# Patient Record
Sex: Male | Born: 1950 | Race: White | Hispanic: No | Marital: Married | Smoking: Never smoker
Health system: Southern US, Community
[De-identification: ages and names within clinical notes are randomized; demographics above are authoritative.]

## PROBLEM LIST (undated history)

## (undated) DIAGNOSIS — N410 Acute prostatitis: Secondary | ICD-10-CM

## (undated) DIAGNOSIS — J9601 Acute respiratory failure with hypoxia: Secondary | ICD-10-CM

## (undated) DIAGNOSIS — K649 Unspecified hemorrhoids: Secondary | ICD-10-CM

## (undated) DIAGNOSIS — L409 Psoriasis, unspecified: Secondary | ICD-10-CM

## (undated) DIAGNOSIS — Z8719 Personal history of other diseases of the digestive system: Secondary | ICD-10-CM

## (undated) DIAGNOSIS — K9189 Other postprocedural complications and disorders of digestive system: Secondary | ICD-10-CM

## (undated) DIAGNOSIS — K5000 Crohn's disease of small intestine without complications: Secondary | ICD-10-CM

## (undated) DIAGNOSIS — I77810 Thoracic aortic ectasia: Secondary | ICD-10-CM

## (undated) DIAGNOSIS — K432 Incisional hernia without obstruction or gangrene: Secondary | ICD-10-CM

## (undated) DIAGNOSIS — K5732 Diverticulitis of large intestine without perforation or abscess without bleeding: Secondary | ICD-10-CM

## (undated) DIAGNOSIS — B029 Zoster without complications: Secondary | ICD-10-CM

## (undated) DIAGNOSIS — N138 Other obstructive and reflux uropathy: Secondary | ICD-10-CM

## (undated) DIAGNOSIS — M1990 Unspecified osteoarthritis, unspecified site: Secondary | ICD-10-CM

## (undated) DIAGNOSIS — B009 Herpesviral infection, unspecified: Secondary | ICD-10-CM

## (undated) DIAGNOSIS — K567 Ileus, unspecified: Secondary | ICD-10-CM

## (undated) DIAGNOSIS — K759 Inflammatory liver disease, unspecified: Secondary | ICD-10-CM

## (undated) DIAGNOSIS — I471 Supraventricular tachycardia, unspecified: Secondary | ICD-10-CM

## (undated) DIAGNOSIS — N453 Epididymo-orchitis: Secondary | ICD-10-CM

## (undated) DIAGNOSIS — T7840XA Allergy, unspecified, initial encounter: Secondary | ICD-10-CM

## (undated) DIAGNOSIS — K602 Anal fissure, unspecified: Secondary | ICD-10-CM

## (undated) DIAGNOSIS — K90829 Short bowel syndrome, unspecified: Secondary | ICD-10-CM

## (undated) DIAGNOSIS — K449 Diaphragmatic hernia without obstruction or gangrene: Secondary | ICD-10-CM

## (undated) DIAGNOSIS — IMO0002 Reserved for concepts with insufficient information to code with codable children: Secondary | ICD-10-CM

## (undated) DIAGNOSIS — N401 Enlarged prostate with lower urinary tract symptoms: Secondary | ICD-10-CM

## (undated) DIAGNOSIS — Z5189 Encounter for other specified aftercare: Secondary | ICD-10-CM

## (undated) DIAGNOSIS — K219 Gastro-esophageal reflux disease without esophagitis: Secondary | ICD-10-CM

## (undated) HISTORY — DX: Other obstructive and reflux uropathy: N40.1

## (undated) HISTORY — PX: HEMICOLECTOMY: SHX854

## (undated) HISTORY — DX: Encounter for other specified aftercare: Z51.89

## (undated) HISTORY — PX: ESOPHAGOGASTRODUODENOSCOPY: SHX1529

## (undated) HISTORY — PX: ILEOSTOMY: SHX1783

## (undated) HISTORY — PX: CYSTOSCOPY: SUR368

## (undated) HISTORY — DX: Gastro-esophageal reflux disease without esophagitis: K21.9

## (undated) HISTORY — DX: Allergy, unspecified, initial encounter: T78.40XA

## (undated) HISTORY — DX: Herpesviral infection, unspecified: B00.9

## (undated) HISTORY — DX: Acute prostatitis: N41.0

## (undated) HISTORY — DX: Psoriasis, unspecified: L40.9

## (undated) HISTORY — DX: Inflammatory liver disease, unspecified: K75.9

## (undated) HISTORY — DX: Unspecified osteoarthritis, unspecified site: M19.90

## (undated) HISTORY — DX: Anal fissure, unspecified: K60.2

## (undated) HISTORY — PX: HEMORRHOID BANDING: SHX5850

## (undated) HISTORY — PX: PACEMAKER INSERTION: SHX728

## (undated) HISTORY — DX: Epididymo-orchitis: N45.3

## (undated) HISTORY — DX: Thoracic aortic ectasia: I77.810

## (undated) HISTORY — PX: UPPER GASTROINTESTINAL ENDOSCOPY: SHX188

## (undated) HISTORY — DX: Diverticulitis of large intestine without perforation or abscess without bleeding: K57.32

## (undated) HISTORY — DX: Personal history of other diseases of the digestive system: Z87.19

## (undated) HISTORY — DX: Unspecified hemorrhoids: K64.9

## (undated) HISTORY — DX: Acute respiratory failure with hypoxia: J96.01

## (undated) HISTORY — PX: ILEOSTOMY CLOSURE: SHX1784

## (undated) HISTORY — DX: Reserved for concepts with insufficient information to code with codable children: IMO0002

## (undated) HISTORY — PX: VASECTOMY: SHX75

## (undated) HISTORY — PX: BRONCHOSCOPY: SUR163

## (undated) HISTORY — DX: Supraventricular tachycardia, unspecified: I47.10

## (undated) HISTORY — DX: Crohn's disease of small intestine without complications: K50.00

## (undated) HISTORY — DX: Supraventricular tachycardia: I47.1

## (undated) HISTORY — DX: Other obstructive and reflux uropathy: N13.8

## (undated) HISTORY — DX: Incisional hernia without obstruction or gangrene: K43.2

## (undated) HISTORY — DX: Zoster without complications: B02.9

## (undated) HISTORY — PX: HERNIA REPAIR: SHX51

## (undated) HISTORY — DX: Short bowel syndrome, unspecified: K90.829

## (undated) HISTORY — DX: Diaphragmatic hernia without obstruction or gangrene: K44.9

## (undated) HISTORY — PX: TONSILLECTOMY: SUR1361

---

## 1973-07-17 DIAGNOSIS — K759 Inflammatory liver disease, unspecified: Secondary | ICD-10-CM

## 1973-07-17 HISTORY — DX: Inflammatory liver disease, unspecified: K75.9

## 1998-08-10 ENCOUNTER — Emergency Department (HOSPITAL_COMMUNITY): Admission: EM | Admit: 1998-08-10 | Discharge: 1998-08-10 | Payer: Self-pay

## 1998-08-21 ENCOUNTER — Ambulatory Visit (HOSPITAL_COMMUNITY): Admission: RE | Admit: 1998-08-21 | Discharge: 1998-08-21 | Payer: Self-pay

## 1999-01-28 ENCOUNTER — Ambulatory Visit (HOSPITAL_COMMUNITY): Admission: RE | Admit: 1999-01-28 | Discharge: 1999-01-28 | Payer: Self-pay | Admitting: Gastroenterology

## 1999-01-28 ENCOUNTER — Encounter: Payer: Self-pay | Admitting: Gastroenterology

## 1999-03-24 ENCOUNTER — Ambulatory Visit (HOSPITAL_COMMUNITY): Admission: RE | Admit: 1999-03-24 | Discharge: 1999-03-24 | Payer: Self-pay | Admitting: Gastroenterology

## 1999-07-01 ENCOUNTER — Emergency Department (HOSPITAL_COMMUNITY): Admission: EM | Admit: 1999-07-01 | Discharge: 1999-07-01 | Payer: Self-pay | Admitting: Emergency Medicine

## 1999-07-20 ENCOUNTER — Ambulatory Visit (HOSPITAL_COMMUNITY): Admission: RE | Admit: 1999-07-20 | Discharge: 1999-07-20 | Payer: Self-pay | Admitting: Gastroenterology

## 1999-07-20 ENCOUNTER — Encounter: Payer: Self-pay | Admitting: Gastroenterology

## 1999-10-30 ENCOUNTER — Emergency Department (HOSPITAL_COMMUNITY): Admission: EM | Admit: 1999-10-30 | Discharge: 1999-10-30 | Payer: Self-pay | Admitting: Emergency Medicine

## 1999-12-01 ENCOUNTER — Encounter: Payer: Self-pay | Admitting: Gastroenterology

## 1999-12-01 ENCOUNTER — Ambulatory Visit (HOSPITAL_COMMUNITY): Admission: RE | Admit: 1999-12-01 | Discharge: 1999-12-01 | Payer: Self-pay | Admitting: Gastroenterology

## 2001-01-02 ENCOUNTER — Encounter (INDEPENDENT_AMBULATORY_CARE_PROVIDER_SITE_OTHER): Payer: Self-pay | Admitting: Specialist

## 2001-01-02 ENCOUNTER — Ambulatory Visit (HOSPITAL_COMMUNITY): Admission: RE | Admit: 2001-01-02 | Discharge: 2001-01-02 | Payer: Self-pay | Admitting: Critical Care Medicine

## 2001-01-02 ENCOUNTER — Encounter: Payer: Self-pay | Admitting: Critical Care Medicine

## 2001-01-03 ENCOUNTER — Encounter: Payer: Self-pay | Admitting: Critical Care Medicine

## 2001-01-03 ENCOUNTER — Ambulatory Visit (HOSPITAL_COMMUNITY): Admission: RE | Admit: 2001-01-03 | Discharge: 2001-01-03 | Payer: Self-pay | Admitting: Critical Care Medicine

## 2002-08-28 ENCOUNTER — Emergency Department (HOSPITAL_COMMUNITY): Admission: EM | Admit: 2002-08-28 | Discharge: 2002-08-28 | Payer: Self-pay | Admitting: Emergency Medicine

## 2002-08-28 ENCOUNTER — Encounter: Payer: Self-pay | Admitting: Emergency Medicine

## 2003-11-12 ENCOUNTER — Encounter: Admission: RE | Admit: 2003-11-12 | Discharge: 2003-11-12 | Payer: Self-pay | Admitting: Surgery

## 2003-11-27 ENCOUNTER — Encounter: Admission: RE | Admit: 2003-11-27 | Discharge: 2003-11-27 | Payer: Self-pay | Admitting: Surgery

## 2003-12-21 ENCOUNTER — Ambulatory Visit (HOSPITAL_COMMUNITY): Admission: RE | Admit: 2003-12-21 | Discharge: 2003-12-21 | Payer: Self-pay | Admitting: Gastroenterology

## 2003-12-29 ENCOUNTER — Encounter: Admission: RE | Admit: 2003-12-29 | Discharge: 2004-03-28 | Payer: Self-pay | Admitting: Gastroenterology

## 2004-01-15 HISTORY — PX: COLON SURGERY: SHX602

## 2004-03-15 ENCOUNTER — Emergency Department (HOSPITAL_COMMUNITY): Admission: EM | Admit: 2004-03-15 | Discharge: 2004-03-15 | Payer: Self-pay | Admitting: Emergency Medicine

## 2004-06-08 ENCOUNTER — Encounter: Admission: RE | Admit: 2004-06-08 | Discharge: 2004-07-26 | Payer: Self-pay | Admitting: Surgery

## 2004-06-11 ENCOUNTER — Ambulatory Visit: Payer: Self-pay | Admitting: Internal Medicine

## 2004-06-25 ENCOUNTER — Ambulatory Visit: Payer: Self-pay | Admitting: Family Medicine

## 2004-07-07 ENCOUNTER — Ambulatory Visit: Payer: Self-pay | Admitting: Family Medicine

## 2004-08-12 ENCOUNTER — Ambulatory Visit: Payer: Self-pay | Admitting: Family Medicine

## 2004-10-17 ENCOUNTER — Ambulatory Visit: Payer: Self-pay | Admitting: Family Medicine

## 2004-11-29 ENCOUNTER — Ambulatory Visit: Payer: Self-pay | Admitting: Family Medicine

## 2005-01-26 ENCOUNTER — Ambulatory Visit: Payer: Self-pay | Admitting: Family Medicine

## 2005-02-10 ENCOUNTER — Inpatient Hospital Stay (HOSPITAL_COMMUNITY): Admission: RE | Admit: 2005-02-10 | Discharge: 2005-02-12 | Payer: Self-pay | Admitting: Surgery

## 2005-02-23 ENCOUNTER — Ambulatory Visit: Payer: Self-pay | Admitting: Family Medicine

## 2005-03-02 ENCOUNTER — Ambulatory Visit: Payer: Self-pay | Admitting: Family Medicine

## 2005-05-01 ENCOUNTER — Ambulatory Visit: Payer: Self-pay | Admitting: Family Medicine

## 2005-07-04 ENCOUNTER — Encounter: Admission: RE | Admit: 2005-07-04 | Discharge: 2005-07-04 | Payer: Self-pay | Admitting: Surgery

## 2005-08-09 ENCOUNTER — Ambulatory Visit: Payer: Self-pay | Admitting: Family Medicine

## 2006-06-08 ENCOUNTER — Emergency Department (HOSPITAL_COMMUNITY): Admission: EM | Admit: 2006-06-08 | Discharge: 2006-06-08 | Payer: Self-pay | Admitting: Emergency Medicine

## 2006-06-11 ENCOUNTER — Ambulatory Visit: Payer: Self-pay | Admitting: Family Medicine

## 2006-11-09 ENCOUNTER — Ambulatory Visit: Payer: Self-pay | Admitting: Family Medicine

## 2006-12-19 ENCOUNTER — Ambulatory Visit: Payer: Self-pay | Admitting: Family Medicine

## 2007-03-06 ENCOUNTER — Ambulatory Visit: Payer: Self-pay | Admitting: Family Medicine

## 2007-03-06 DIAGNOSIS — Z8719 Personal history of other diseases of the digestive system: Secondary | ICD-10-CM | POA: Insufficient documentation

## 2007-03-06 DIAGNOSIS — J019 Acute sinusitis, unspecified: Secondary | ICD-10-CM | POA: Insufficient documentation

## 2007-03-06 DIAGNOSIS — J45909 Unspecified asthma, uncomplicated: Secondary | ICD-10-CM | POA: Insufficient documentation

## 2007-03-06 DIAGNOSIS — K219 Gastro-esophageal reflux disease without esophagitis: Secondary | ICD-10-CM | POA: Insufficient documentation

## 2007-03-08 ENCOUNTER — Telehealth: Payer: Self-pay | Admitting: Family Medicine

## 2007-03-15 ENCOUNTER — Ambulatory Visit: Payer: Self-pay | Admitting: Family Medicine

## 2007-07-24 ENCOUNTER — Ambulatory Visit: Payer: Self-pay | Admitting: Family Medicine

## 2007-07-24 DIAGNOSIS — N410 Acute prostatitis: Secondary | ICD-10-CM

## 2007-07-24 DIAGNOSIS — N400 Enlarged prostate without lower urinary tract symptoms: Secondary | ICD-10-CM | POA: Insufficient documentation

## 2007-07-24 HISTORY — DX: Acute prostatitis: N41.0

## 2007-07-24 LAB — CONVERTED CEMR LAB
Bilirubin Urine: NEGATIVE
Blood in Urine, dipstick: NEGATIVE
Glucose, Urine, Semiquant: NEGATIVE
Ketones, urine, test strip: NEGATIVE
Protein, U semiquant: NEGATIVE
Urobilinogen, UA: 0.2

## 2007-09-17 ENCOUNTER — Ambulatory Visit: Payer: Self-pay | Admitting: Family Medicine

## 2007-09-19 LAB — CONVERTED CEMR LAB: GC Probe Amp, Urine: NEGATIVE

## 2007-10-09 ENCOUNTER — Telehealth: Payer: Self-pay | Admitting: Family Medicine

## 2007-10-14 ENCOUNTER — Ambulatory Visit: Payer: Self-pay | Admitting: Family Medicine

## 2007-10-14 DIAGNOSIS — B009 Herpesviral infection, unspecified: Secondary | ICD-10-CM | POA: Insufficient documentation

## 2007-10-14 HISTORY — DX: Herpesviral infection, unspecified: B00.9

## 2007-12-05 ENCOUNTER — Ambulatory Visit: Payer: Self-pay | Admitting: Family Medicine

## 2008-03-25 ENCOUNTER — Encounter: Payer: Self-pay | Admitting: Family Medicine

## 2008-09-05 ENCOUNTER — Inpatient Hospital Stay (HOSPITAL_COMMUNITY): Admission: EM | Admit: 2008-09-05 | Discharge: 2008-09-06 | Payer: Self-pay | Admitting: Emergency Medicine

## 2008-09-05 ENCOUNTER — Ambulatory Visit: Payer: Self-pay | Admitting: Internal Medicine

## 2008-09-05 ENCOUNTER — Ambulatory Visit: Payer: Self-pay | Admitting: Cardiovascular Disease

## 2008-09-06 ENCOUNTER — Encounter: Payer: Self-pay | Admitting: Family Medicine

## 2008-09-17 ENCOUNTER — Telehealth: Payer: Self-pay | Admitting: Family Medicine

## 2008-09-18 ENCOUNTER — Ambulatory Visit: Payer: Self-pay | Admitting: Family Medicine

## 2008-09-18 DIAGNOSIS — R079 Chest pain, unspecified: Secondary | ICD-10-CM | POA: Insufficient documentation

## 2009-05-28 ENCOUNTER — Ambulatory Visit: Payer: Self-pay | Admitting: Family Medicine

## 2009-05-28 DIAGNOSIS — R109 Unspecified abdominal pain: Secondary | ICD-10-CM | POA: Insufficient documentation

## 2009-05-28 DIAGNOSIS — IMO0001 Reserved for inherently not codable concepts without codable children: Secondary | ICD-10-CM | POA: Insufficient documentation

## 2009-05-28 DIAGNOSIS — F909 Attention-deficit hyperactivity disorder, unspecified type: Secondary | ICD-10-CM | POA: Insufficient documentation

## 2009-05-28 LAB — CONVERTED CEMR LAB
Ketones, urine, test strip: NEGATIVE
Urobilinogen, UA: 0.2
pH: 5.5

## 2009-05-31 LAB — CONVERTED CEMR LAB
BUN: 12 mg/dL (ref 6–23)
CO2: 26 meq/L (ref 19–32)
Creatinine, Ser: 1.2 mg/dL (ref 0.4–1.5)
GFR calc non Af Amer: 66.47 mL/min (ref >60)
Glucose, Bld: 84 mg/dL (ref 70–99)
Potassium: 3.8 meq/L (ref 3.5–5.1)
Total Bilirubin: 1.2 mg/dL (ref 0.3–1.2)
Total Protein: 6.5 g/dL (ref 6.0–8.3)

## 2009-06-28 ENCOUNTER — Telehealth: Payer: Self-pay | Admitting: Family Medicine

## 2009-10-06 ENCOUNTER — Ambulatory Visit: Payer: Self-pay | Admitting: Family Medicine

## 2009-10-06 DIAGNOSIS — K5289 Other specified noninfective gastroenteritis and colitis: Secondary | ICD-10-CM | POA: Insufficient documentation

## 2009-10-07 ENCOUNTER — Encounter: Payer: Self-pay | Admitting: Family Medicine

## 2009-10-07 ENCOUNTER — Telehealth: Payer: Self-pay | Admitting: Family Medicine

## 2009-10-08 LAB — CONVERTED CEMR LAB
AST: 26 units/L (ref 0–37)
Albumin: 3.7 g/dL (ref 3.5–5.2)
Alkaline Phosphatase: 60 units/L (ref 39–117)
BUN: 13 mg/dL (ref 6–23)
Basophils Relative: 0.1 % (ref 0.0–3.0)
Bilirubin, Direct: 0 mg/dL (ref 0.0–0.3)
CO2: 33 meq/L — ABNORMAL HIGH (ref 19–32)
Chloride: 108 meq/L (ref 96–112)
Creatinine, Ser: 1.5 mg/dL (ref 0.4–1.5)
Eosinophils Absolute: 0.1 10*3/uL (ref 0.0–0.7)
HCT: 49 % (ref 39.0–52.0)
HCV Ab: NEGATIVE
Hep A IgM: NEGATIVE
Hep B S Ab: NEGATIVE
Lymphocytes Relative: 26.8 % (ref 12.0–46.0)
MCHC: 33.3 g/dL (ref 30.0–36.0)
Monocytes Absolute: 0.7 10*3/uL (ref 0.1–1.0)
Neutrophils Relative %: 62.5 % (ref 43.0–77.0)
Platelets: 227 10*3/uL (ref 150.0–400.0)
Potassium: 3.6 meq/L (ref 3.5–5.1)
RBC: 5.36 M/uL (ref 4.22–5.81)
Total Bilirubin: 0.4 mg/dL (ref 0.3–1.2)

## 2009-10-26 ENCOUNTER — Telehealth: Payer: Self-pay | Admitting: Family Medicine

## 2009-12-15 ENCOUNTER — Ambulatory Visit: Payer: Self-pay | Admitting: Family Medicine

## 2009-12-15 DIAGNOSIS — J069 Acute upper respiratory infection, unspecified: Secondary | ICD-10-CM | POA: Insufficient documentation

## 2009-12-17 ENCOUNTER — Ambulatory Visit: Payer: Self-pay | Admitting: Family Medicine

## 2009-12-17 DIAGNOSIS — J209 Acute bronchitis, unspecified: Secondary | ICD-10-CM | POA: Insufficient documentation

## 2009-12-25 ENCOUNTER — Ambulatory Visit: Payer: Self-pay | Admitting: Family Medicine

## 2010-02-15 ENCOUNTER — Ambulatory Visit: Payer: Self-pay | Admitting: Family Medicine

## 2010-02-15 DIAGNOSIS — N453 Epididymo-orchitis: Secondary | ICD-10-CM | POA: Insufficient documentation

## 2010-02-15 HISTORY — DX: Epididymo-orchitis: N45.3

## 2010-02-15 LAB — CONVERTED CEMR LAB
Glucose, Urine, Semiquant: NEGATIVE
Ketones, urine, test strip: NEGATIVE
Nitrite: NEGATIVE
Protein, U semiquant: 30
Urobilinogen, UA: 0.2

## 2010-02-18 ENCOUNTER — Encounter: Payer: Self-pay | Admitting: Family Medicine

## 2010-03-07 ENCOUNTER — Encounter: Payer: Self-pay | Admitting: Family Medicine

## 2010-03-17 ENCOUNTER — Telehealth: Payer: Self-pay | Admitting: Family Medicine

## 2010-04-04 ENCOUNTER — Encounter: Payer: Self-pay | Admitting: Family Medicine

## 2010-05-30 ENCOUNTER — Ambulatory Visit: Payer: Self-pay | Admitting: Family Medicine

## 2010-08-16 NOTE — Progress Notes (Signed)
Summary: lab results  Phone Note Call from Patient Call back at Home Phone [phone]   Caller: Patient Summary of Call: patient would like his lab test results.   Initial call taken by: Westley Hummer CMA Deborra Medina),  October 07, 2009 3:59 PM  Follow-up for Phone Call        all normal. This is consistent with a GI virus which should resolve soon Follow-up by: [name] MD,  October 08, 2009 8:39 AM  Additional Follow-up for Phone Call Additional follow up Details #1::        called. Additional Follow-up by: Chipper Oman, RN,  October 08, 2009 9:39 AM

## 2010-08-16 NOTE — Assessment & Plan Note (Signed)
Summary: not any better//ccm   Vital Signs:  Patient profile:   60 year old male Weight:      225 pounds Temp:     98.2 degrees F oral BP sitting:   130 / 80  (left arm) Cuff size:   regular  Vitals Entered By: Chipper Oman, RN (December 17, 2009 10:15 AM) CC: No better, still congested and coughing.   History of Present Illness: Here for worsening chest congestion and coughing up green sputum. No fever.   Allergies: 1)  ! * Shellfish  Past History:  Past Medical History: Reviewed history from 10/06/2009 and no changes required. Diverticular Disease Hepatitis, unknown type, 1975 Asthma Diverticulitis, hx of GERD with a hiatal hernia UTI's Benign prostatic hypertrophy, sees Dr. Reece Agar psoriasis, sees Dr. Daiva Eves at [facility] dermatology  Review of Systems  The patient denies anorexia, fever, weight loss, weight gain, vision loss, decreased hearing, hoarseness, chest pain, syncope, dyspnea on exertion, peripheral edema, headaches, hemoptysis, abdominal pain, melena, hematochezia, severe indigestion/heartburn, hematuria, incontinence, genital sores, muscle weakness, suspicious skin lesions, transient blindness, difficulty walking, depression, unusual weight change, abnormal bleeding, enlarged lymph nodes, angioedema, breast masses, and testicular masses.    Physical Exam  General:  Well-developed,well-nourished,in no acute distress; alert,appropriate and cooperative throughout examination Head:  Normocephalic and atraumatic without obvious abnormalities. No apparent alopecia or balding. Eyes:  No corneal or conjunctival inflammation noted. EOMI. Perrla. Funduscopic exam benign, without hemorrhages, exudates or papilledema. Vision grossly normal. Ears:  External ear exam shows no significant lesions or deformities.  Otoscopic examination reveals clear canals, tympanic membranes are intact bilaterally without bulging, retraction, inflammation or discharge. Hearing is grossly  normal bilaterally. Nose:  External nasal examination shows no deformity or inflammation. Nasal mucosa are pink and moist without lesions or exudates. Mouth:  Oral mucosa and oropharynx without lesions or exudates.  Teeth in good repair. Neck:  No deformities, masses, or tenderness noted. Lungs:  scattered rhonchi , no rales Heart:  Normal rate and regular rhythm. S1 and S2 normal without gallop, murmur, click, rub or other extra sounds.   Impression & Recommendations:  Problem # 1:  ACUTE BRONCHITIS (ICD-466.0)  His updated medication list for this problem includes:    Proventil Hfa 108 (90 Base) Mcg/act Aers (Albuterol sulfate) .Marland Kitchen... 2 puffs q 4 hours as needed    Zithromax Z-pak 250 Mg Tabs (Azithromycin) .Marland Kitchen... As directed    Hydromet 5-1.5 Mg/58m Syrp (Hydrocodone-homatropine) ..Marland Kitchen.. 1 tsp q 4 hours as needed cough  Complete Medication List: 1)  Wellbutrin Xl 300 Mg Tb24 (Bupropion hcl) .... Once daily 2)  Cialis 2.5 Mg Tabs (Tadalafil) ..Marland Kitchen. 1 or 2 once daily 3)  Famvir 500 Mg Tabs (Famciclovir) .... Once daily 4)  Proventil Hfa 108 (90 Base) Mcg/act Aers (Albuterol sulfate) .... 2 puffs q 4 hours as needed 5)  Focalin Xr 20 Mg Xr24h-cap (Dexmethylphenidate hcl) .... Once daily, may fill on 12-26-09 6)  Flexeril 10 Mg Tabs (Cyclobenzaprine hcl) .... Three times a day as needed spasm 7)  Zithromax Z-pak 250 Mg Tabs (Azithromycin) .... As directed 8)  Hydromet 5-1.5 Mg/563mSyrp (Hydrocodone-homatropine) ...Marland Kitchen 1 tsp q 4 hours as needed cough  Patient Instructions: 1)  Please schedule a follow-up appointment as needed .  Prescriptions: HYDROMET 5-1.5 MG/5ML SYRP (HYDROCODONE-HOMATROPINE) 1 tsp q 4 hours as needed cough  #240 x 0   Entered and Authorized by:   StLaurey MoraleD   Signed by:   StLaurey Morale  MD on 12/17/2009   Method used:   Print then Give to Patient   RxID:   [id number] ZITHROMAX Z-PAK 250 MG TABS (AZITHROMYCIN) as directed  #1 x 0   Entered and Authorized by:    [name] MD   Signed by:   [name] MD on 12/17/2009   Method used:   Print then Give to Patient   RxID:   [id number]

## 2010-08-16 NOTE — Assessment & Plan Note (Signed)
Summary: ? uti//ccm   Vital Signs:  Patient profile:   60 year old male Weight:      231 pounds Temp:     98.0 degrees F oral BP sitting:   140 / 90  (left arm) Cuff size:   large  Vitals Entered By: Kathrynn Speed CMA (February 15, 2010 11:30 AM) CC: UTI, src   History of Present Illness: Here for about a month of intermittent right  testicular pains. he has had these off and on for a couple years. No fever. No trouble with urinations. No recent trauma. He took a course of Cipro for this last year, but it did not seem to help.   Current Medications (verified): 1)  Wellbutrin Xl 300 Mg  Tb24 (Bupropion Hcl) .... Once Daily 2)  Cialis 2.5 Mg  Tabs (Tadalafil) .Marland Kitchen.. 1 or 2 Once Daily 3)  Famvir 500 Mg  Tabs (Famciclovir) .... Once Daily 4)  Proventil Hfa 108 (90 Base) Mcg/act Aers (Albuterol Sulfate) .... 2 Puffs Q 4 Hours As Needed 5)  Focalin Xr 20 Mg Xr24h-Cap (Dexmethylphenidate Hcl) .... Once Daily, May Fill On 12-26-09 6)  Flexeril 10 Mg Tabs (Cyclobenzaprine Hcl) .... Three Times A Day As Needed Spasm  Allergies (verified): 1)  ! * Shellfish  Past History:  Past Medical History: Reviewed history from 10/06/2009 and no changes required. Diverticular Disease Hepatitis, unknown type, 1975 Asthma Diverticulitis, hx of GERD with a hiatal hernia UTI's Benign prostatic hypertrophy, sees Dr. Wanda Plump psoriasis, sees Dr. Novella Rob at [facility] dermatology  Past Surgical History: Reviewed history from 03/06/2007 and no changes required. Hemicolectomy ileostomy colonoscopy cystoscopy bronchoscopy Tonsillectomy Vasectomy  Review of Systems  The patient denies anorexia, fever, weight loss, weight gain, vision loss, decreased hearing, hoarseness, chest pain, syncope, dyspnea on exertion, peripheral edema, prolonged cough, headaches, hemoptysis, abdominal pain, melena, hematochezia, severe indigestion/heartburn, hematuria, incontinence, genital sores, muscle weakness,  suspicious skin lesions, transient blindness, difficulty walking, depression, unusual weight change, abnormal bleeding, enlarged lymph nodes, angioedema, breast masses, and testicular masses.    Physical Exam  General:  Well-developed,well-nourished,in no acute distress; alert,appropriate and cooperative throughout examination Abdomen:  Bowel sounds positive,abdomen soft and non-tender without masses, organomegaly or hernias noted. Genitalia:  there is a small cystic mass to the medial side of the right testicle which is quite tender. The testicle is not tender. The left side is normal   Impression & Recommendations:  Problem # 1:  EPIDIDYMITIS (ICD-604.90)  Orders: UA Dipstick w/o Micro (manual) (16109)  Complete Medication List: 1)  Wellbutrin Xl 300 Mg Tb24 (Bupropion hcl) .... Once daily 2)  Cialis 2.5 Mg Tabs (Tadalafil) .Marland Kitchen.. 1 or 2 once daily 3)  Famvir 500 Mg Tabs (Famciclovir) .... Once daily 4)  Proventil Hfa 108 (90 Base) Mcg/act Aers (Albuterol sulfate) .... 2 puffs q 4 hours as needed 5)  Focalin Xr 20 Mg Xr24h-cap (Dexmethylphenidate hcl) .... Once daily, may fill on 12-26-09 6)  Flexeril 10 Mg Tabs (Cyclobenzaprine hcl) .... Three times a day as needed spasm 7)  Bactrim Ds 800-160 Mg Tabs (Sulfamethoxazole-trimethoprim) .... Two times a day  Patient Instructions: 1)  Treat with 30 days of Bactrim DS. He already made an appt to see Dr. Aldean Ast on 03-04-10, so I encouraged him to keep this. The scrotal mass should be evaluated with an [place], and this could be done that day.  Prescriptions: BACTRIM DS 800-160 MG TABS (SULFAMETHOXAZOLE-TRIMETHOPRIM) two times a day  #60 x 0   Entered and  Authorized by:   [name] MD   Signed by:   [name] MD on 02/15/2010   Method used:   Electronically to        [phone][facility] (retail)       [address]       [place], [place]  [address]       Ph: [phone] or [phone]       Fax: [phone]   RxID:    [id number]   Laboratory Results   Urine Tests  Date/Time Received: February 15, 2010 11:38 AM   Routine Urinalysis   Color: yellow Appearance: Clear Glucose: negative   (Normal Range: Negative) Bilirubin: negative   (Normal Range: Negative) Ketone: negative   (Normal Range: Negative) Spec. Gravity: 1.030   (Normal Range: 1.003-1.035) Blood: negative   (Normal Range: Negative) pH: 6.0   (Normal Range: 5.0-8.0) Protein: 30    (Normal Range: Negative) Urobilinogen: 0.2   (Normal Range: 0-1) Nitrite: negative   (Normal Range: Negative) Leukocyte Esterace: negative   (Normal Range: Negative)    Comments: Kathrynn Speed CMA  February 15, 2010 11:40 AM

## 2010-08-16 NOTE — Assessment & Plan Note (Signed)
Summary: ? gi issues//ccm   Vital Signs:  Patient profile:   60 year old male Weight:      222 pounds O2 Sat:      94 % Temp:     97.8 degrees F Pulse rate:   100 / minute BP sitting:   140 / 80  (left arm)  Vitals Entered By: Levora Angel, RN (May 30, 2010 10:38 AM) CC: diarrhea cramps since Friday no vomiting    History of Present Illness: Here for 3 days of mid abdominal cramps and diarrhea. No nausea or vomiting. He had a fever the first 24 hours but none since then. Using Imodium. No international travel. His wife is not sick.   Allergies: 1)  ! * Shellfish  Past History:  Past Medical History: Reviewed history from 10/06/2009 and no changes required. Diverticular Disease Hepatitis, unknown type, 1975 Asthma Diverticulitis, hx of GERD with a hiatal hernia UTI's Benign prostatic hypertrophy, sees Dr. Reece Agar psoriasis, sees Dr. Daiva Eves at [facility] dermatology  Past Surgical History: Reviewed history from 03/06/2007 and no changes required. Hemicolectomy ileostomy colonoscopy cystoscopy bronchoscopy Tonsillectomy Vasectomy  Review of Systems  The patient denies anorexia, weight loss, weight gain, vision loss, decreased hearing, hoarseness, chest pain, syncope, dyspnea on exertion, peripheral edema, prolonged cough, headaches, hemoptysis, melena, hematochezia, severe indigestion/heartburn, hematuria, incontinence, genital sores, muscle weakness, suspicious skin lesions, transient blindness, difficulty walking, depression, unusual weight change, abnormal bleeding, enlarged lymph nodes, angioedema, breast masses, and testicular masses.    Physical Exam  General:  Well-developed,well-nourished,in no acute distress; alert,appropriate and cooperative throughout examination Lungs:  Normal respiratory effort, chest expands symmetrically. Lungs are clear to auscultation, no crackles or wheezes. Heart:  Normal rate and regular rhythm. S1 and S2 normal without  gallop, murmur, click, rub or other extra sounds. Abdomen:  soft, normal bowel sounds, no distention, no masses, no guarding, no rigidity, no rebound tenderness, no abdominal hernia, no inguinal hernia, no hepatomegaly, and no splenomegaly.  Mild periumbilical tenederness .   Impression & Recommendations:  Problem # 1:  GASTROENTERITIS (ICD-558.9)  His updated medication list for this problem includes:    Lomotil 2.5-0.025 Mg Tabs (Diphenoxylate-atropine) .Marland Kitchen... 2 tabs q 4 hours as needed diarrhea  Complete Medication List: 1)  Wellbutrin Xl 300 Mg Tb24 (Bupropion hcl) .... Once daily 2)  Cialis 2.5 Mg Tabs (Tadalafil) .Marland Kitchen.. 1 or 2 once daily 3)  Famvir 500 Mg Tabs (Famciclovir) .... Once daily 4)  Proventil Hfa 108 (90 Base) Mcg/act Aers (Albuterol sulfate) .... 2 puffs q 4 hours as needed 5)  Focalin Xr 20 Mg Xr24h-cap (Dexmethylphenidate hcl) .... Once daily, may fill on 05-18-10 6)  Flexeril 10 Mg Tabs (Cyclobenzaprine hcl) .... Three times a day as needed spasm 7)  Lomotil 2.5-0.025 Mg Tabs (Diphenoxylate-atropine) .... 2 tabs q 4 hours as needed diarrhea  Patient Instructions: 1)  Use Lomotil as needed . Drink plenty of fluids. This should resolve soon.  Prescriptions: LOMOTIL 2.5-0.025 MG TABS (DIPHENOXYLATE-ATROPINE) 2 tabs q 4 hours as needed diarrhea  #60 x 1   Entered and Authorized by:   [name] MD   Signed by:   [name] MD on 05/30/2010   Method used:   Print then Give to Patient   RxID:   [id number]    Orders Added: 1)  Est. Patient Level IV [68127]

## 2010-08-16 NOTE — Assessment & Plan Note (Signed)
Summary: COUGH, CONGESTION // RS   Vital Signs:  Patient profile:   60 year old male Weight:      227 pounds Temp:     97.2 degrees F oral Pulse rate:   76 / minute Pulse rhythm:   regular BP sitting:   148 / 74  (right arm) Cuff size:   regular  Vitals Entered By: Lowella Petties CMA (December 25, 2009 9:54 AM) CC: Treated for bronchitis with z-pack, not any better   History of Present Illness: has had uri infection and bronchitis  finished z pack on tues  symptoms have changed  congestion feels deeper in chest - yellow green phlegm  still feels bad  no fever -- but some mild chills or aches   non smoker  has occas asthma -- seldom has symptoms  no wheezing now   lot of head congestion and headache/ sinus pain - this is new and worsening   ears and throat are ok   Allergies: 1)  ! * Shellfish  Past History:  Past Medical History: Last updated: 10/06/2009 Diverticular Disease Hepatitis, unknown type, 1975 Asthma Diverticulitis, hx of GERD with a hiatal hernia UTI's Benign prostatic hypertrophy, sees Dr. Wanda Plump psoriasis, sees Dr. Novella Rob at [facility] dermatology  Past Surgical History: Last updated: 03/06/2007 Hemicolectomy ileostomy colonoscopy cystoscopy bronchoscopy Tonsillectomy Vasectomy  Family History: Last updated: 03/06/2007 Family History Lung cancer Family History of Prostate CA 1st degree relative <50 Family History of Cardiovascular disorder  Social History: Last updated: 03/06/2007 Occupation: Married Never Smoked Alcohol use-yes Drug use-no Regular exercise-yes  Risk Factors: Exercise: yes (03/06/2007)  Risk Factors: Smoking Status: never (12/15/2009)  Review of Systems General:  Complains of fatigue and malaise. Eyes:  Complains of eye irritation; denies blurring and discharge. ENT:  Complains of nasal congestion, postnasal drainage, and sinus pressure; denies earache and sore throat. CV:  Denies chest pain or  discomfort, palpitations, and shortness of breath with exertion. Resp:  Complains of cough and sputum productive; denies pleuritic, shortness of breath, and wheezing. GI:  Denies diarrhea, nausea, and vomiting. Derm:  Denies rash.  Physical Exam  General:  Well-developed,well-nourished,in no acute distress; alert,appropriate and cooperative throughout examination Head:  normocephalic, atraumatic, and no abnormalities observed.  bilat ethmoid and maxillary sinus tenderness  Eyes:  vision grossly intact, pupils equal, pupils round, pupils reactive to light, and no injection.   Ears:  R ear normal and L ear normal.   Nose:  nares are injected and congested bilaterally  Mouth:  pharynx pink and moist, no erythema, and no exudates.   Neck:  No deformities, masses, or tenderness noted. Chest Wall:  No deformities, masses, tenderness or gynecomastia noted. Lungs:  Normal respiratory effort, chest expands symmetrically. Lungs are clear to auscultation, no crackles or wheezes. Heart:  Normal rate and regular rhythm. S1 and S2 normal without gallop, murmur, click, rub or other extra sounds. Skin:  Intact without suspicious lesions or rashes Cervical Nodes:  No lymphadenopathy noted Psych:  normal affect, talkative and pleasant    Impression & Recommendations:  Problem # 1:  SINUSITIS - ACUTE-NOS (ICD-461.9) Assessment New  s/p uri- after tx with zpack for bronchitis  some sinus pain and pressure- poss fever  disc poss of viral infx running its course also cover with augmentin  recommend sympt care- see pt instructions   update if not imp - f/u His updated medication list for this problem includes:    Hydromet 5-1.5 Mg/51ml Syrp (Hydrocodone-homatropine) .Marland Kitchen... 1 tsp  q 4 hours as needed cough    Augmentin 875-125 Mg Tabs (Amoxicillin-pot clavulanate) .Marland Kitchen... 1 by mouth two times a day for 10 days  Orders: Prescription Created Electronically [phone])  Complete Medication List: 1)  Wellbutrin  Xl 300 Mg Tb24 (Bupropion hcl) .... Once daily 2)  Cialis 2.5 Mg Tabs (Tadalafil) .Marland Kitchen.. 1 or 2 once daily 3)  Famvir 500 Mg Tabs (Famciclovir) .... Once daily 4)  Proventil Hfa 108 (90 Base) Mcg/act Aers (Albuterol sulfate) .... 2 puffs q 4 hours as needed 5)  Focalin Xr 20 Mg Xr24h-cap (Dexmethylphenidate hcl) .... Once daily, may fill on 12-26-09 6)  Flexeril 10 Mg Tabs (Cyclobenzaprine hcl) .... Three times a day as needed spasm 7)  Hydromet 5-1.5 Mg/15ml Syrp (Hydrocodone-homatropine) .Marland Kitchen.. 1 tsp q 4 hours as needed cough 8)  Augmentin 875-125 Mg Tabs (Amoxicillin-pot clavulanate) .Marland Kitchen.. 1 by mouth two times a day for 10 days  Patient Instructions: 1)  drink lots of fluids  2)  take the augmentin for sinus infecti Prescriptions: AUGMENTIN 875-125 MG TABS (AMOXICILLIN-POT CLAVULANATE) 1 by mouth two times a day for 10 days  #20 x 0   Entered and Authorized by:   Judith Part MD   Signed by:   Judith Part MD on 12/25/2009   Method used:   Print then Give to Patient   RxID:   [id number]   Prior Medications: WELLBUTRIN XL 300 MG  TB24 (BUPROPION HCL) once daily CIALIS 2.5 MG  TABS (TADALAFIL) 1 or 2 once daily FAMVIR 500 MG  TABS (FAMCICLOVIR) once daily PROVENTIL HFA 108 (90 BASE) MCG/ACT AERS (ALBUTEROL SULFATE) 2 puffs q 4 hours as needed FOCALIN XR 20 MG XR24H-CAP (DEXMETHYLPHENIDATE HCL) once daily, may fill on 12-26-09 FLEXERIL 10 MG TABS (CYCLOBENZAPRINE HCL) three times a day as needed spasm HYDROMET 5-1.5 MG/5ML SYRP (HYDROCODONE-HOMATROPINE) 1 tsp q 4 hours as needed cough Current Allergies (reviewed today): ! * SHELLFISH

## 2010-08-16 NOTE — Assessment & Plan Note (Signed)
Summary: ? virus//ccm   Vital Signs:  Patient profile:   60 year old male Weight:      225 pounds Temp:     97.6 degrees F oral Pulse rate:   72 / minute Pulse rhythm:   regular BP sitting:   152 / 82  (left arm) Cuff size:   regular  Vitals Entered By: Raechel Ache, RN (October 06, 2009 8:34 AM) CC: C/o vomiting and diarrhea last Friday night and stayed in bed all wekend. Stomach better now but weak.   History of Present Illness: Here for some abdominal symptoms that have lasted for 6 days. Last week abut 3 hours after eating at a [employer] he had the sudden onset of abdominal cramps, diarrhea, and vomitting. No fever but he did feel warm and clammy. The vomitting lasted about 2 days and then stopped, although he has felt some nausea ever since. His stools are still loose but are slightly formed now. He did not notice any jaundice. He has been drinking Gatorade, eating bland foods, and using Phenergan. Each day he gets a little stronger. he is back to work now.   Allergies: 1)  ! * Shellfish  Past History:  Past Medical History: Diverticular Disease Hepatitis, unknown type, 1975 Asthma Diverticulitis, hx of GERD with a hiatal hernia UTI's Benign prostatic hypertrophy, sees Dr. Wanda Plump psoriasis, sees Dr. Novella Rob at [facility] dermatology  Past Surgical History: Reviewed history from 03/06/2007 and no changes required. Hemicolectomy ileostomy colonoscopy cystoscopy bronchoscopy Tonsillectomy Vasectomy  Review of Systems  The patient denies anorexia, fever, weight loss, weight gain, vision loss, decreased hearing, hoarseness, chest pain, syncope, dyspnea on exertion, peripheral edema, prolonged cough, headaches, hemoptysis, melena, hematochezia, severe indigestion/heartburn, hematuria, incontinence, genital sores, muscle weakness, suspicious skin lesions, transient blindness, difficulty walking, depression, unusual weight change, abnormal bleeding, enlarged  lymph nodes, angioedema, breast masses, and testicular masses.    Physical Exam  General:  Well-developed,well-nourished,in no acute distress; alert,appropriate and cooperative throughout examination Lungs:  Normal respiratory effort, chest expands symmetrically. Lungs are clear to auscultation, no crackles or wheezes. Heart:  Normal rate and regular rhythm. S1 and S2 normal without gallop, murmur, click, rub or other extra sounds. Abdomen:  Bowel sounds positive,abdomen soft and non-tender without masses, organomegaly or hernias noted. Skin:  no jaundice   Impression & Recommendations:  Problem # 1:  GASTROENTERITIS (ICD-558.9)  Orders: UA Dipstick w/o Micro (automated)  (81003) Venipuncture (54098) TLB-BMP (Basic Metabolic Panel-BMET) (80048-METABOL) TLB-CBC Platelet - w/Differential (85025-CBCD) TLB-Hepatic/Liver Function Pnl (80076-HEPATIC) TLB-Ammonia (82140-AMON) T-Hepatitis Acute Panel (11914-78295) T-Hepatitis B Core Antibody (62130-86578) T-Hepatitis B Surface Antigen (46962-95284) T-Hepatitis B Surface Antibody (13244-01027) T-Hepatitis C Antibody (25366-44034) T-Hepatitis B Core Antibody, IGM (74259-56387)  Complete Medication List: 1)  Wellbutrin Xl 300 Mg Tb24 (Bupropion hcl) .... Once daily 2)  Cialis 2.5 Mg Tabs (Tadalafil) .Marland Kitchen.. 1 or 2 once daily 3)  Famvir 500 Mg Tabs (Famciclovir) .... Once daily 4)  Proventil Hfa 108 (90 Base) Mcg/act Aers (Albuterol sulfate) .... 2 puffs q 4 hours as needed 5)  Focalin Xr 20 Mg Xr24h-cap (Dexmethylphenidate hcl) .... Once daily, may fill on 08-29-09 6)  Flexeril 10 Mg Tabs (Cyclobenzaprine hcl) .... Three times a day as needed spasm  Patient Instructions: 1)  This is either a resolving viral gastroenteritis or hepatitis. He seems to be improving. Continue with fluids and a bland diet. Get labs today to evaluate further.   Appended Document: ? virus//ccm  Laboratory Results   Urine Tests  Routine Urinalysis   Color:  yellow Appearance: Clear Glucose: negative   (Normal Range: Negative) Bilirubin: negative   (Normal Range: Negative) Ketone: negative   (Normal Range: Negative) Spec. Gravity: >=1.030   (Normal Range: 1.003-1.035) Blood: negative   (Normal Range: Negative) pH: 5.5   (Normal Range: 5.0-8.0) Protein: trace   (Normal Range: Negative) Urobilinogen: 0.2   (Normal Range: 0-1) Nitrite: negative   (Normal Range: Negative) Leukocyte Esterace: negative   (Normal Range: Negative)    Comments: Rita Ohara  October 06, 2009 10:37 AM

## 2010-08-16 NOTE — Progress Notes (Signed)
Summary: REFILL REQUEST  Phone Note Refill Request   Refills Requested: Medication #1:  FAMVIR 500 MG  TABS once daily   Notes: Pt can be reached at [phone] when Rx is ready for p/u.  Medication #2:  FOCALIN XR 20 MG XR24H-CAP once daily   Notes: Pt can be reached at [phone] when Rx is ready for p/u.    Initial call taken by: Debbra Riding,  March 17, 2010 4:47 PM  Follow-up for Phone Call        done Follow-up by: [name] MD,  March 18, 2010 11:16 AM  Additional Follow-up for Phone Call Additional follow up Details #1::        called. Additional Follow-up by: Raechel Ache, RN,  March 18, 2010 11:49 AM    New/Updated Medications: FAMVIR 500 MG  TABS (FAMCICLOVIR) once daily FOCALIN XR 20 MG XR24H-CAP (DEXMETHYLPHENIDATE HCL) once daily FOCALIN XR 20 MG XR24H-CAP (DEXMETHYLPHENIDATE HCL) once daily, may fill on 04-17-10 FOCALIN XR 20 MG XR24H-CAP (DEXMETHYLPHENIDATE HCL) once daily, may fill on 05-18-10 Prescriptions: FOCALIN XR 20 MG XR24H-CAP (DEXMETHYLPHENIDATE HCL) once daily, may fill on 05-18-10  #30 x 0   Entered and Authorized by:   [name] MD   Signed by:   [name] MD on 03/18/2010   Method used:   Print then Give to Patient   RxID:   [id number] FOCALIN XR 20 MG XR24H-CAP (DEXMETHYLPHENIDATE HCL) once daily, may fill on 04-17-10  #30 x 0   Entered and Authorized by:   [name] MD   Signed by:   [name] MD on 03/18/2010   Method used:   Print then Give to Patient   RxID:   [id number] FOCALIN XR 20 MG XR24H-CAP (DEXMETHYLPHENIDATE HCL) once daily  #30 x 0   Entered and Authorized by:   [name] MD   Signed by:   [name] MD on 03/18/2010   Method used:   Print then Give to Patient   RxID:   [id number] FAMVIR 500 MG  TABS (FAMCICLOVIR) once daily  #90 x 3   Entered and Authorized by:   [name] MD   Signed by:   [name] MD on 03/18/2010   Method used:   Print then Give to  Patient   RxID:   [id number]

## 2010-08-16 NOTE — Letter (Signed)
Summary: [facility]   Imported By: Laural Benes 04/20/2010 10:12:36  _____________________________________________________________________  External Attachment:    Type:   Image     Comment:   External Document

## 2010-08-16 NOTE — Letter (Signed)
Summary: [facility]   Imported By: Maryln Gottron 03/01/2010 09:06:01  _____________________________________________________________________  External Attachment:    Type:   Image     Comment:   External Document

## 2010-08-16 NOTE — Assessment & Plan Note (Signed)
Summary: ?upper resp inf/cjr   Vital Signs:  Patient profile:   60 year old male Weight:      225 pounds Temp:     98 degrees F Pulse rate:   68 / minute Pulse rhythm:   regular Resp:     14 per minute BP sitting:   132 / 76  (left arm) Cuff size:   regular  Vitals Entered By: Gladis Riffle, RN (December 15, 2009 8:54 AM) CC: c/o chest tightness, cough x 2 days Is Patient Diabetic? No   History of Present Illness: Here for 3 days of PND and a dry hacky cough. No SOB or fever. No sinus symptoms. Using  OTC cough meds.  Preventive Screening-Counseling & Management  Alcohol-Tobacco     Smoking Status: never  Current Medications (verified): 1)  Wellbutrin Xl 300 Mg  Tb24 (Bupropion Hcl) .... Once Daily 2)  Cialis 2.5 Mg  Tabs (Tadalafil) .Marland Kitchen.. 1 or 2 Once Daily 3)  Famvir 500 Mg  Tabs (Famciclovir) .... Once Daily 4)  Proventil Hfa 108 (90 Base) Mcg/act Aers (Albuterol Sulfate) .... 2 Puffs Q 4 Hours As Needed 5)  Focalin Xr 20 Mg Xr24h-Cap (Dexmethylphenidate Hcl) .... Once Daily, May Fill On 12-26-09 6)  Flexeril 10 Mg Tabs (Cyclobenzaprine Hcl) .... Three Times A Day As Needed Spasm  Allergies: 1)  ! * Shellfish  Past History:  Past Medical History: Reviewed history from 10/06/2009 and no changes required. Diverticular Disease Hepatitis, unknown type, 1975 Asthma Diverticulitis, hx of GERD with a hiatal hernia UTI's Benign prostatic hypertrophy, sees Dr. Wanda Plump psoriasis, sees Dr. Novella Rob at [facility] dermatology  Review of Systems  The patient denies anorexia, fever, weight loss, weight gain, vision loss, decreased hearing, hoarseness, chest pain, syncope, dyspnea on exertion, peripheral edema, headaches, hemoptysis, abdominal pain, melena, hematochezia, severe indigestion/heartburn, hematuria, incontinence, genital sores, muscle weakness, suspicious skin lesions, transient blindness, difficulty walking, depression, unusual weight change, abnormal bleeding, enlarged  lymph nodes, angioedema, breast masses, and testicular masses.    Physical Exam  General:  Well-developed,well-nourished,in no acute distress; alert,appropriate and cooperative throughout examination Head:  Normocephalic and atraumatic without obvious abnormalities. No apparent alopecia or balding. Eyes:  No corneal or conjunctival inflammation noted. EOMI. Perrla. Funduscopic exam benign, without hemorrhages, exudates or papilledema. Vision grossly normal. Ears:  External ear exam shows no significant lesions or deformities.  Otoscopic examination reveals clear canals, tympanic membranes are intact bilaterally without bulging, retraction, inflammation or discharge. Hearing is grossly normal bilaterally. Nose:  External nasal examination shows no deformity or inflammation. Nasal mucosa are pink and moist without lesions or exudates. Mouth:  Oral mucosa and oropharynx without lesions or exudates.  Teeth in good repair. Neck:  No deformities, masses, or tenderness noted. Lungs:  Normal respiratory effort, chest expands symmetrically. Lungs are clear to auscultation, no crackles or wheezes. Heart:  Normal rate and regular rhythm. S1 and S2 normal without gallop, murmur, click, rub or other extra sounds.   Impression & Recommendations:  Problem # 1:  VIRAL URI (ICD-465.9)  Complete Medication List: 1)  Wellbutrin Xl 300 Mg Tb24 (Bupropion hcl) .... Once daily 2)  Cialis 2.5 Mg Tabs (Tadalafil) .Marland Kitchen.. 1 or 2 once daily 3)  Famvir 500 Mg Tabs (Famciclovir) .... Once daily 4)  Proventil Hfa 108 (90 Base) Mcg/act Aers (Albuterol sulfate) .... 2 puffs q 4 hours as needed 5)  Focalin Xr 20 Mg Xr24h-cap (Dexmethylphenidate hcl) .... Once daily, may fill on 12-26-09 6)  Flexeril 10  Mg Tabs (Cyclobenzaprine hcl) .... Three times a day as needed spasm  Patient Instructions: 1)  fluids, rest.  2)  Please schedule a follow-up appointment as needed .

## 2010-08-16 NOTE — Consult Note (Signed)
Summary: [facility]   Imported By: Laural Benes 03/14/2010 13:26:25  _____________________________________________________________________  External Attachment:    Type:   Image     Comment:   External Document

## 2010-08-16 NOTE — Progress Notes (Signed)
Summary: new rx  Phone Note Call from Patient Call back at Home Phone [phone]   Caller: Patient Call For: [name] MD Summary of Call: pt needs new rx focalin xr 20 mg please call when ready for pick up Initial call taken by: Glo Herring,  October 26, 2009 11:11 AM  Follow-up for Phone Call        done Follow-up by: [name] MD,  October 26, 2009 4:41 PM  Additional Follow-up for Phone Call Additional follow up Details #1::        Phone Call Completed Additional Follow-up by: Chipper Oman, RN,  October 26, 2009 4:51 PM    New/Updated Medications: FOCALIN XR 20 MG XR24H-CAP (DEXMETHYLPHENIDATE HCL) once daily FOCALIN XR 20 MG XR24H-CAP (DEXMETHYLPHENIDATE HCL) once daily, may fill on 11-25-09 FOCALIN XR 20 MG XR24H-CAP (DEXMETHYLPHENIDATE HCL) once daily, may fill on 12-26-09 Prescriptions: FOCALIN XR 20 MG XR24H-CAP (DEXMETHYLPHENIDATE HCL) once daily, may fill on 12-26-09  #30 x 0   Entered and Authorized by:   [name] MD   Signed by:   [name] MD on 10/26/2009   Method used:   Print then Give to Patient   RxID:   [id number] FOCALIN XR 20 MG XR24H-CAP (DEXMETHYLPHENIDATE HCL) once daily, may fill on 11-25-09  #30 x 0   Entered and Authorized by:   [name] MD   Signed by:   [name] MD on 10/26/2009   Method used:   Print then Give to Patient   RxID:   [id number] FOCALIN XR 20 MG XR24H-CAP (DEXMETHYLPHENIDATE HCL) once daily  #30 x 0   Entered and Authorized by:   [name] MD   Signed by:   [name] MD on 10/26/2009   Method used:   Print then Give to Patient   RxID:   [id number]

## 2010-09-01 ENCOUNTER — Telehealth: Payer: Self-pay | Admitting: Family Medicine

## 2010-09-01 NOTE — Telephone Encounter (Signed)
Pt req script for Focalin XR 59m. Pls call when ready for pick up.

## 2010-09-02 MED ORDER — DEXMETHYLPHENIDATE HCL ER 20 MG PO CP24: 20.0000 mg | ORAL_CAPSULE | Freq: Every day | ORAL | Status: DC

## 2010-09-02 NOTE — Telephone Encounter (Signed)
Pt notified rx ready for pick up stated will pick up Monday

## 2010-09-02 NOTE — Telephone Encounter (Signed)
done

## 2010-09-25 ENCOUNTER — Other Ambulatory Visit: Payer: Self-pay | Admitting: Family Medicine

## 2010-09-27 ENCOUNTER — Other Ambulatory Visit: Payer: Self-pay | Admitting: Family Medicine

## 2010-11-01 LAB — HEPATIC FUNCTION PANEL
ALT: 19 U/L (ref 0–53)
Alkaline Phosphatase: 64 U/L (ref 39–117)
Bilirubin, Direct: <0.1 mg/dL (ref 0.0–0.3)

## 2010-11-01 LAB — COMPREHENSIVE METABOLIC PANEL
ALT: 23 U/L (ref 0–53)
BUN: 12 mg/dL (ref 6–23)
Calcium: 8.8 mg/dL (ref 8.4–10.5)
GFR calc Af Amer: >60 mL/min (ref >60)
GFR calc non Af Amer: 59 mL/min — ABNORMAL LOW (ref >60)
Glucose, Bld: 95 mg/dL (ref 70–99)
Potassium: 3.8 mEq/L (ref 3.5–5.1)
Sodium: 140 mEq/L (ref 135–145)
Total Bilirubin: 0.4 mg/dL (ref 0.3–1.2)

## 2010-11-01 LAB — URINALYSIS, ROUTINE W REFLEX MICROSCOPIC
Glucose, UA: NEGATIVE mg/dL
Hgb urine dipstick: NEGATIVE
Ketones, ur: NEGATIVE mg/dL
pH: 7.5 (ref 5.0–8.0)

## 2010-11-01 LAB — DIFFERENTIAL
Basophils Absolute: 0 10*3/uL (ref 0.0–0.1)
Eosinophils Relative: 2 % (ref 0–5)
Monocytes Absolute: 1 10*3/uL (ref 0.1–1.0)
Monocytes Relative: 11 % (ref 3–12)
Neutrophils Relative %: 63 % (ref 43–77)

## 2010-11-01 LAB — POCT I-STAT, CHEM 8
BUN: 14 mg/dL (ref 6–23)
Calcium, Ion: 1.09 mmol/L — ABNORMAL LOW (ref 1.12–1.32)
Creatinine, Ser: 1.4 mg/dL (ref 0.4–1.5)
Glucose, Bld: 90 mg/dL (ref 70–99)
Potassium: 3.8 mEq/L (ref 3.5–5.1)
Sodium: 142 mEq/L (ref 135–145)

## 2010-11-01 LAB — URINE CULTURE: Colony Count: NO GROWTH

## 2010-11-01 LAB — PROTIME-INR: INR: 1 (ref 0.00–1.49)

## 2010-11-01 LAB — CK TOTAL AND CKMB (NOT AT ARMC)
CK, MB: 1.6 ng/mL (ref 0.3–4.0)
CK, MB: 1.8 ng/mL (ref 0.3–4.0)
Total CK: 60 U/L (ref 7–232)
Total CK: 79 U/L (ref 7–232)

## 2010-11-01 LAB — LIPID PANEL
Cholesterol: 130 mg/dL (ref 0–200)
LDL Cholesterol: 53 mg/dL (ref 0–99)
Triglycerides: 295 mg/dL — ABNORMAL HIGH (ref <150)

## 2010-11-01 LAB — CBC
HCT: 42.8 % (ref 39.0–52.0)
HCT: 44.7 % (ref 39.0–52.0)
Hemoglobin: 15.6 g/dL (ref 13.0–17.0)
MCHC: 34.8 g/dL (ref 30.0–36.0)
MCHC: 34.9 g/dL (ref 30.0–36.0)
MCV: 89.8 fL (ref 78.0–100.0)
Platelets: 180 10*3/uL (ref 150–400)
Platelets: 191 10*3/uL (ref 150–400)
RDW: 13.3 % (ref 11.5–15.5)
WBC: 9.5 10*3/uL (ref 4.0–10.5)

## 2010-11-01 LAB — RAPID URINE DRUG SCREEN, HOSP PERFORMED
Amphetamines: NOT DETECTED
Barbiturates: NOT DETECTED
Opiates: NOT DETECTED
Tetrahydrocannabinol: NOT DETECTED

## 2010-11-01 LAB — POCT CARDIAC MARKERS
Myoglobin, poc: 58.7 ng/mL (ref 12–200)
Troponin i, poc: <0.05 ng/mL (ref 0.00–0.09)
Troponin i, poc: <0.05 ng/mL (ref 0.00–0.09)

## 2010-11-01 LAB — TROPONIN I
Troponin I: <0.01 ng/mL (ref 0.00–0.06)
Troponin I: <0.01 ng/mL (ref 0.00–0.06)

## 2010-11-01 LAB — APTT: aPTT: 28 seconds (ref 24–37)

## 2010-11-01 LAB — HEPARIN LEVEL (UNFRACTIONATED): Heparin Unfractionated: 0.22 IU/mL — ABNORMAL LOW (ref 0.30–0.70)

## 2010-11-29 NOTE — H&P (Signed)
[name], Callahan NO.:  [id number]   MEDICAL RECORD NO.:  [id number]          PATIENT TYPE:  INP   LOCATION:  0737                         FACILITY:  [facility]   PHYSICIAN:  Marletta Lor, MDDATE OF BIRTH:  18-Jan-1951   DATE OF ADMISSION:  09/04/2008  DATE OF DISCHARGE:                              HISTORY & PHYSICAL   CHIEF COMPLAINT:  Chest pain.   HISTORY OF PRESENT ILLNESS:  The patient is a 60 year old gentleman  without prior cardiac history who had the onset of chest pain after  having dinner at a local steakhouse.  While ambulating to his car, he  had the onset of significant substernal chest pain described as a severe  heaviness.  He rated the pain as 4/10 scale.  He stated the pain waxed  and waned, but was intermittently present for an hour and a half to 2  hours.  Due to the persistent pain, he was evaluated at the fire  department and was advised to be evaluated at the emergency room.  He  was transported via EMS and received aspirin en route and upon arrival  to the emergency department he did receive nitroglycerin, but was pain  free at that time.  He has had no recurrent pain.   The patient denies any history of tobacco use, hypertension, diabetes,  or dyslipidemia.  There is no family history of premature heart disease.  He is now admitted for further evaluation and treatment of his chest  pain and to exclude an acute coronary syndrome.   PAST MEDICAL HISTORY:  The patient has a history of mild intermittent  asthma.  He does have a history of complex diverticular disease.  In  2005, he was operated on in [place] for a diverticular abscess.  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  In 2006, the patient had a ventral hernia repair.  He takes  no chronic medications other than p.r.n. albuterol, which is rare.   SOCIAL HISTORY:  He is married, 2 sons, nonsmoker.   FAMILY HISTORY:  Father died  at age 23 of complications from leukemia.  Mother died at 5 of lung cancer.  One brother and one sister in good  health.   PHYSICAL EXAMINATION:  GENERAL:  Well-developed, healthy-appearing male  in no acute distress.  VITAL SIGNS:  Blood pressure is 130/76, pulse rate 70.  SKIN:  Warm and dry without rash.  HEAD AND NECK:  Normal pupil responses.  Conjunctiva clear.  ENT normal.  NECK:  No bruits, neck vein distention, or adenopathy.  CHEST:  Clear.  CARDIAC:  No chest wall tenderness.  S1 and S2 normal.  No murmurs or  gallops.  ABDOMEN:  Multiple surgical scars.  No organomegaly, masses, or bruits.  EXTERNAL GENITALIA:  Normal.  EXTREMITIES:  No edema.  Peripheral pulses were full.   IMPRESSION:  Chest pain syndrome, rule out acute coronary syndrome.   DISPOSITION:  We will admit the patient to telemetry and cycle enzymes.  An EKG will be reviewed in the morning.  He will be placed on aspirin,  beta-blocker therapy, and full heparinization.      Marletta Lor, MD  Electronically Signed     PFK/MEDQ  D:  09/05/2008  T:  09/05/2008  Job:  [id number]

## 2010-11-29 NOTE — H&P (Signed)
NAME:  [name], SHELL NO.:  [id number]   MEDICAL RECORD NO.:  [id number]         PATIENT TYPE:  CINP   LOCATION:                               FACILITY:  [facility]   PHYSICIAN:  Marletta Lor, MDDATE OF BIRTH:  02/08/1951   DATE OF ADMISSION:  09/05/2008  DATE OF DISCHARGE:                              HISTORY & PHYSICAL   The patient was stable until 9 a.m. of the day of admission when shortly  after being significant pressure-type upwards while ambulating to his  car.  He states the pain lasts one and a half to 2 hours.  He denied any  shortness of breath or due to his persistent pain he was advised to go  to the emergency room.  En route to the emergency room, he received  nitroglycerin.  He did receive nitroglycerin in the emergency department  setting.  Initial cardiac enzymes have been   Dictation ended at this point.      Marletta Lor, MD  Electronically Signed     PFK/MEDQ  D:  09/05/2008  T:  09/05/2008  Job:  [id number]

## 2010-11-29 NOTE — Discharge Summary (Signed)
[name], [name] NO.:  [id number]   MEDICAL RECORD NO.:  [id number]          PATIENT TYPE:  INP   LOCATION:  4401                         FACILITY:  [facility]   PHYSICIAN:  Kathlene November, MD           DATE OF BIRTH:  Apr 02, 1951   DATE OF ADMISSION:  09/04/2008  DATE OF DISCHARGE:  09/06/2008                               DISCHARGE SUMMARY   ADMISSION DIAGNOSIS:  Chest pain.   DISCHARGE DIAGNOSIS:  Chest pain.   HISTORY OF PRESENT ILLNESS:  [name] is a 60 year old gentleman  without prior history of heart disease who presented to the emergency  room with a 2-hour history of substernal chest pain described as severe  heaviness.  He denies any abdominal pain or GERD symptoms.  There was no  associated nausea, there was no radiation but he had some shortness of  breath and diaphoresis.   PHYSICAL EXAMINATION:  GENERAL:  On exam, he is a well-developed,  healthy-appearing male in no acute distress.  VITAL SIGNS:  Blood pressure 130/76, pulse 70.  CHEST:  Clear to auscultation bilaterally.  CARDIOVASCULAR:  Regular rate and rhythm without murmur.  ABDOMEN:  No organomegaly, is soft and nontender.   LABORATORY AND X-RAYS:  White count 9.8, hemoglobin 15.6, platelets 180,  potassium 3.8, creatinine 1.2.  LFTs normal.  Albumin 3.6.  Cardiac  enzymes were negative.  Total cholesterol 130 with a LDL of 53 and a HDL  of 18, triglycerides 295, D-dimer 0.3.  Drug screen negative.  Chest x-  ray negative, EKG normal sinus rhythm.   HOSPITAL COURSE:  The patient was admitted to the hospital with chest  pain, he ruled out for MI, he was kept on heparin.  Cardiology was  called as the patient had no respiratory or GI symptoms that may explain  his chest pain.  They went ahead and did stress test today.  He  completed stage IV of the stress test without any chest pain.  There was  no ST or T changes noted.  The nuclear part of the test is pending.  Cardiology will call me  with the results as soon as they become  available, and if they were negative, the patient will be let go home  with the following instructions:  1. Aspirin 81 mg daily.  2. Prilosec over-the-counter 20 mg daily.  3. Continue with Wellbutrin as before.  4. I recommend to see [name], his primary care doctor within 2 weeks.  5. Call the office or come back to the emergency room if the chest      pain resurfaces.  6. His triglycerides are slightly elevated and he is encouraged to      talk with his primary care doctor about the cholesterol results.      If the stress test ended up being positive, he will be kept in the      hospital.      Kathlene November, MD  Electronically Signed     JP/MEDQ  D:  09/06/2008  T:  09/06/2008  Job:  [id number] 

## 2010-11-29 NOTE — Consult Note (Signed)
[name], [name] NO.:  [id number]   MEDICAL RECORD NO.:  [id number]          PATIENT TYPE:  INP   LOCATION:  9024                         FACILITY:  [facility]   PHYSICIAN:  [name]. [name], MD, FACCDATE OF BIRTH:  26-Aug-1950   DATE OF CONSULTATION:  09/05/2008  DATE OF DISCHARGE:                                 CONSULTATION   PRIMARY CARDIOLOGIST:  [name]. [name], MD, [facility]   PRIMARY CARE PHYSICIAN:  Marletta Lor, MD   REQUESTING PHYSICIAN:  Kathlene November, MD   REASON FOR CONSULTATION:  Chest pain.   HISTORY OF PRESENT ILLNESS:  This 60 year old Caucasian male without  prior cardiac history who while driving home from [facility]  last evening felt tightness in his chest.  He states that it became  pressure which was worse than the chest tightness in the center of his  chest, nonradiating.  He has a history of asthma and thought this may be  related to an asthma attack.  When he got home, he used his inhaler  thinking it would help, it did not.  The patient's chest discomfort  became more intense, waxing and waning, lasting approximately 1 hour.  He drove to the fire station to be checked out and he was found to be  hypertensive per the patient's report of 097 systolic.  They also did an  EKG there and EMS was called and the patient was sent to ER.  The  patient states that pressure subsided some en route to ER.  He was given  aspirin en route.  His pressure was relieved after a total of  approximately 2 hours total duration.  The pain was nonradiating.  He  had some mild shortness of breath.  He did feel a little clammy, but he  denied any nausea, vomiting, diarrhea, syncope, or near syncope.  The  patient continues to have some mild chest tightness at rest.   REVIEW OF SYSTEMS:  Positive for chest pain and chest tightness.  Otherwise, negative for nausea, vomiting, diarrhea, syncope, or dyspnea.  All other systems are reviewed and found to be  negative.   PAST MEDICAL HISTORY:  Asthma, diverticular disease, hiatal hernia, and  GERD.   PAST SURGICAL HISTORY:  Colon resection at [facility] with a  complicated course causing peritonitis and hernia with incision hernia  repair approximately 1 year later.   SOCIAL HISTORY:  The patient lives in [place] with his wife.  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  He has 2 sons.  He  does not smoke.  Rare EtOH use.  Negative for drug use.   FAMILY HISTORY:  Mother deceased from cancer.  Father deceased from  leukemia.  He has a brother and sister in good health.   CURRENT MEDICATIONS AT HOME:  Wellbutrin 150 mg daily.   CURRENT MEDICATIONS DURING HOSPITALIZATION:  1. Aspirin 325 daily.  2. Colace 100 mg daily.  3. Metoprolol 25 mg b.i.d.  4. Nitroglycerin 1 inch to chest wall q.6 h.   ALLERGIES:  SHELLFISH.   CURRENT LABORATORY  DATA:  Sodium 140, potassium 3.8, chloride 105, CO2  30, BUN 12, creatinine 1.2, and glucose 95.  Hemoglobin 15.6, hematocrit  44.7, white blood cells 9.8, and platelets 180.  D-dimer 0.30.  Troponin  negative x3.  Total cholesterol 130, triglycerides 295, HDL 18, and LDL  53.  Chest x-ray revealing no active lung disease.  Peribronchial  thickening may indicate bronchitis.  EKG revealing normal sinus rhythm,  ventricular rate of 62 beats per minute.   PHYSICAL EXAMINATION:  VITAL SIGNS:  Blood pressure 116/63, pulse 61,  respirations 18, temperature 97.6, and O2 sat 96% on room air.  HEENT:  Head is normocephalic and atraumatic.  Eyes PERRLA.  The patient  does wear glasses.  Mucous membranes and mouth pink and moist.  Tongue  is midline.  No JVD.  No carotid bruits appreciated.  CARDIOVASCULAR:  Regular rate and rhythm without murmurs, rubs, or  gallops.  LUNGS:  Clear to auscultation without wheezes, rales, or rhonchi.  ABDOMEN:  Soft.  There is some mild tenderness in the right upper  quadrant.  Postoperatively, he continues  to have soreness.  EXTREMITIES:  Without clubbing, cyanosis, or edema.  NEUROLOGIC:  Cranial nerves II-XII are grossly intact.   IMPRESSION:  1. Chest pain, negative cardiac enzymes and EKG.  2. History of gastroesophageal reflux disease.  3. History of hiatal hernia.   PLAN:  This 60 year old Caucasian male without prior history of coronary  artery disease admitted with chest pressure and tightness after eating  steak at a steak house.  He has negative cardiac enzymes and EKG.  Chest  pain appears for GI in etiology and he has low risk factors for CAD.  The patient has been seen and examined by myself and Dr. Jenkins Rouge  and is recommended the patient be scheduled for a stress Myoview in the  a.m.  We will start PPI and monitor making further recommendations  throughout hospital course.      [name]. Purcell Nails, NP      [name]. [name], MD, [facility]  Electronically Signed    KML/MEDQ  D:  09/05/2008  T:  09/06/2008  Job:  [id number]   cc:   Marletta Lor, MD

## 2010-12-02 NOTE — Procedures (Signed)
[place]  Patient:    ESTUS, KRAKOWSKI                       MRN: [id number] Proc. Date: 01/02/01 Adm. Date:  [id number] Attending:  Girard Cooter                           Procedure Report  INDICATION:  Low lung volumes, evaluate for tracheobronchitis and interstitial disease.  SURGEON:  Burnett Harry. Joya Gaskins, M.D. [facility]  ANESTHESIA:  1% Xylocaine local.  PREOPERATIVE MEDICATIONS:  Demerol 60 mg, versed 6 mg IV push.  PROCEDURE:  The Olympus video bronchoscope was introduced to the right nares. The upper airways were visualized and were unremarkable.  The entire tracheobronchial tree was visualized and revealed diffuse tracheobronchitis with purulence seen.  No endobronchial lesions were seen.  Attention was then paid to the right middle lobe.  Bronchial lavage was obtained, 100 cc was infused, approximately 40 cc returned.  Attention was then paid to the right lower lobe.  The lateral segment was biopsied x 5 without difficulty.  COMPLICATIONS:  None.  IMPRESSION:  Diffuse tracheal bronchitis.  RECOMMENDATIONS:  Followup pathology and microbiology. DD:  01/02/01 TD:  01/02/01 Job: [id number]

## 2010-12-02 NOTE — Op Note (Signed)
NAMEWITTEN, CERTAIN NO.:  [id number]   MEDICAL RECORD NO.:  [id number]          PATIENT TYPE:  INP   LOCATION:  0005                         FACILITY:  [facility]   PHYSICIAN:  Isabel Caprice. Hassell Done, MD  DATE OF BIRTH:  26-Dec-1950   DATE OF PROCEDURE:  02/10/2005  DATE OF DISCHARGE:                                 OPERATIVE REPORT   PREOPERATIVE DIAGNOSIS:  Ventral incisional hernia and retracted widened  midline scar.   POSTOPERATIVE DIAGNOSIS:  Ventral incisional hernia and retracted widened  midline scar, status post scar revision, repair of the upper midline hernia  with anterior fascial repair using Ultrapro mesh.   DESCRIPTION OF PROCEDURE:  Mr. Colclough was brought to OR #1, given general  anesthesia.  Preoperatively, he did receive a bowel prep.  I excised his old  midline scar in its entirety and took this down to the fascia inferiorly,  and the hernia sort of came off in the midline, and the upper one edge  showed the scar.  I then entered the hernia sac and defined the hernia and  found the fascia overall was pretty attenuated, going over to the left side.  There seemed to be some retraction and diastasis of the rectus abdominis  muscle.  I went very wide to get to good fascia and then in the actual  hernia defect in the midline, I was able to approximate primarily with a  running 2-0 Prolene after taking down some a small bowel adhesions to the  hernia sac itself and inspecting everything from the inside with my finger.  Prior to closure of this, however, I placed simple horizontal mattress  sutures all around the perimeter of this defect, getting bites in rectus  sheath, midline inferiorly, and then holding these in place.  I was able to  put my finger inside the abdomen to make sure that I had come all the way  through into the abdomen compartment with the Prolene sutures.  Those were  held, and then the attenuated fascia was closed completely separate  these  compartments.  I then cut a piece of mesh in the shape of [place] as it turned out that fit to cover the upper midline fascial defect  and them with the tail, it came down the midline wound.  This was Ultrapro  mesh, and it was then threaded through the horizontal mattress sutures and  tied down.  It was snug and a little taut and seemed to reinforce this  nicely.  There was essentially no bleeding.  I irrigated well with saline,  and I closed the subcutaneous tissue with 3-0 Vicryl and subcuticular with 4-  0 Vicryl, and the wound was closed with a running vertical mattress suture  of 3-0 nylon with interspersed with some staples.  The patient seemed to  tolerate the procedure well and was taken to the recovery room in  satisfactory condition to be kept overnight for observation.       MBM/MEDQ  D:  02/10/2005  T:  02/10/2005  Job:  [id number]  cc:   Loralee Pacas. Sharlett Iles, M.D. [facility]   Theron Arista, MD  [facility], Dept. of Surgery

## 2010-12-30 ENCOUNTER — Other Ambulatory Visit: Payer: Self-pay | Admitting: Family Medicine

## 2010-12-30 NOTE — Telephone Encounter (Signed)
Pt called req refill of dexmethylphenidate (FOCALIN XR) 20 MG 24 hr capsule 3 month supply.

## 2011-01-03 ENCOUNTER — Telehealth: Payer: Self-pay | Admitting: *Deleted

## 2011-01-03 NOTE — Telephone Encounter (Signed)
Pt calling to check status of refill

## 2011-01-04 MED ORDER — DEXMETHYLPHENIDATE HCL ER 20 MG PO CP24: 20.0000 mg | ORAL_CAPSULE | Freq: Every day | ORAL | Status: DC

## 2011-01-04 NOTE — Telephone Encounter (Signed)
Pt notified rx ready for pick up.

## 2011-01-04 NOTE — Telephone Encounter (Signed)
done

## 2011-01-21 ENCOUNTER — Other Ambulatory Visit: Payer: Self-pay | Admitting: Family Medicine

## 2011-02-23 ENCOUNTER — Ambulatory Visit (INDEPENDENT_AMBULATORY_CARE_PROVIDER_SITE_OTHER): Payer: 59 | Admitting: Family Medicine

## 2011-02-23 ENCOUNTER — Encounter: Payer: Self-pay | Admitting: Family Medicine

## 2011-02-23 VITALS — BP 136/80 | HR 76 | Temp 97.9°F | Wt 230.0 lb

## 2011-02-23 DIAGNOSIS — J329 Chronic sinusitis, unspecified: Secondary | ICD-10-CM

## 2011-02-23 MED ORDER — AZITHROMYCIN 250 MG PO TABS: ORAL_TABLET | ORAL | Status: AC

## 2011-02-23 MED ORDER — DEXMETHYLPHENIDATE HCL ER 20 MG PO CP24: 20.0000 mg | ORAL_CAPSULE | Freq: Every day | ORAL | Status: DC

## 2011-02-23 MED ORDER — TADALAFIL 5 MG PO TABS: 5.0000 mg | ORAL_TABLET | Freq: Every day | ORAL | Status: DC

## 2011-02-23 MED ORDER — ALBUTEROL SULFATE HFA 108 (90 BASE) MCG/ACT IN AERS: 2.0000 | INHALATION_SPRAY | RESPIRATORY_TRACT | Status: DC | PRN

## 2011-02-23 MED ORDER — FAMCICLOVIR 500 MG PO TABS: 500.0000 mg | ORAL_TABLET | Freq: Every day | ORAL | Status: AC

## 2011-02-23 NOTE — Progress Notes (Signed)
  Subjective:    Patient ID: [name], male    DOB: Jun 21, 1951, 60 y.o.   MRN: [id number]  HPI Here for 2 weeks of sinus pressure, HA, PND , ST, and a dry cough. No fever.    Review of Systems  Constitutional: Negative.   HENT: Positive for congestion, postnasal drip and sinus pressure.   Eyes: Negative.   Respiratory: Positive for cough.        Objective:   Physical Exam  Constitutional: He appears well-developed and well-nourished.  HENT:  Right Ear: External ear normal.  Left Ear: External ear normal.  Nose: Nose normal.  Mouth/Throat: Oropharynx is clear and moist. No oropharyngeal exudate.  Eyes: Conjunctivae are normal. Pupils are equal, round, and reactive to light.  Neck: Neck supple. No thyromegaly present.  Pulmonary/Chest: Effort normal and breath sounds normal.  Lymphadenopathy:    He has no cervical adenopathy.          Assessment & Plan:  Recheck prn

## 2011-04-25 ENCOUNTER — Telehealth: Payer: Self-pay | Admitting: Family Medicine

## 2011-04-25 NOTE — Telephone Encounter (Signed)
Refill Focalin Xr 72m. Thanks.

## 2011-04-27 MED ORDER — DEXMETHYLPHENIDATE HCL ER 20 MG PO CP24: 20.0000 mg | ORAL_CAPSULE | Freq: Every day | ORAL | Status: DC

## 2011-04-27 NOTE — Telephone Encounter (Signed)
done

## 2011-04-27 NOTE — Telephone Encounter (Signed)
Script ready for pick up and pt aware.

## 2011-06-17 ENCOUNTER — Other Ambulatory Visit: Payer: Self-pay | Admitting: Family Medicine

## 2011-07-17 ENCOUNTER — Ambulatory Visit (INDEPENDENT_AMBULATORY_CARE_PROVIDER_SITE_OTHER): Payer: 59 | Admitting: Family Medicine

## 2011-07-17 ENCOUNTER — Encounter: Payer: Self-pay | Admitting: Family Medicine

## 2011-07-17 VITALS — BP 140/90 | HR 90 | Temp 98.4°F | Wt 239.0 lb

## 2011-07-17 DIAGNOSIS — N419 Inflammatory disease of prostate, unspecified: Secondary | ICD-10-CM

## 2011-07-17 MED ORDER — CIPROFLOXACIN HCL 500 MG PO TABS: 500.0000 mg | ORAL_TABLET | Freq: Two times a day (BID) | ORAL | Status: AC

## 2011-07-17 NOTE — Progress Notes (Signed)
  Subjective:    Patient ID: CARVIN ALMAS, male    DOB: Oct 08, 1950, 60 y.o.   MRN: [id number]  HPI Here for 5 days of feeling weak, achy, and having to urinate more frequently than normal. No cough or ST. No NVD.    Review of Systems  Constitutional: Positive for fatigue. Negative for fever.  HENT: Negative.   Respiratory: Negative.   Cardiovascular: Negative.   Gastrointestinal: Negative.   Genitourinary: Positive for frequency. Negative for urgency, hematuria and flank pain.  Musculoskeletal: Negative for arthralgias.       Objective:   Physical Exam  Constitutional: He appears well-developed and well-nourished.  Neck: No thyromegaly present.  Cardiovascular: Normal rate, regular rhythm, normal heart sounds and intact distal pulses.   Pulmonary/Chest: Effort normal and breath sounds normal.  Abdominal: Soft. Bowel sounds are normal. He exhibits no distension and no mass. There is no tenderness. There is no rebound and no guarding.  Genitourinary:       Prostate is mildly enlarged, boggy, and tender   Lymphadenopathy:    He has no cervical adenopathy.          Assessment & Plan:  Drink plenty of fluids

## 2011-07-21 ENCOUNTER — Telehealth: Payer: Self-pay | Admitting: Family Medicine

## 2011-07-21 NOTE — Telephone Encounter (Signed)
Call in Vicodin 7.5/500 to take q 6 hours prn pain, #60 with no rf

## 2011-07-21 NOTE — Telephone Encounter (Signed)
Pt was dx with prostatitis on 07-17-11. Pt is no better requesting pain med call into [facility] [phone]

## 2011-07-26 MED ORDER — HYDROCODONE-ACETAMINOPHEN 7.5-500 MG PO TABS: 1.0000 | ORAL_TABLET | Freq: Four times a day (QID) | ORAL | Status: AC | PRN

## 2011-07-26 NOTE — Telephone Encounter (Signed)
rx called in

## 2011-08-02 ENCOUNTER — Encounter: Payer: Self-pay | Admitting: Family Medicine

## 2011-08-02 ENCOUNTER — Ambulatory Visit (INDEPENDENT_AMBULATORY_CARE_PROVIDER_SITE_OTHER): Payer: 59 | Admitting: Family Medicine

## 2011-08-02 VITALS — BP 138/80 | HR 65 | Temp 97.9°F | Wt 243.0 lb

## 2011-08-02 DIAGNOSIS — N419 Inflammatory disease of prostate, unspecified: Secondary | ICD-10-CM

## 2011-08-02 MED ORDER — SULFAMETHOXAZOLE-TRIMETHOPRIM 800-160 MG PO TABS: 1.0000 | ORAL_TABLET | Freq: Two times a day (BID) | ORAL | Status: AC

## 2011-08-02 NOTE — Progress Notes (Signed)
  Subjective:    Patient ID: [name], male    DOB: [birth year], 61 y.o.   MRN: [id number]  HPI Here for a prostate infection that has not responded to Cipro. He feels better in the sense that his back ache is gone and there is no fever, but he still has pain in the perineum and pain in the right testicle. Some urgency to urinate but no burning. Using Vicodin at night to sleep.    Review of Systems  Constitutional: Negative.   Genitourinary: Positive for urgency, frequency and testicular pain. Negative for dysuria and flank pain.       Objective:   Physical Exam  Constitutional: He appears well-developed and well-nourished.  Genitourinary:       The right testicle and epididymis are tender but not swollen. The prostate is swollen and quite tender          Assessment & Plan:  Partially treated prostatitis. Switch to Bactrim DS for 30 days. Call if not better in a week

## 2011-09-21 ENCOUNTER — Telehealth: Payer: Self-pay | Admitting: Family Medicine

## 2011-09-21 NOTE — Telephone Encounter (Signed)
Pt req script for dexmethylphenidate (FOCALIN XR) 20 MG 24 hr capsule. Pls notify pt when ready for pick up.

## 2011-09-22 MED ORDER — DEXMETHYLPHENIDATE HCL ER 20 MG PO CP24: 20.0000 mg | ORAL_CAPSULE | Freq: Every day | ORAL | Status: DC

## 2011-09-22 NOTE — Telephone Encounter (Signed)
done

## 2011-09-25 NOTE — Telephone Encounter (Signed)
Scripts ready for pick up and spoke with pt.

## 2011-10-17 ENCOUNTER — Encounter: Payer: Self-pay | Admitting: Internal Medicine

## 2011-10-17 ENCOUNTER — Ambulatory Visit (INDEPENDENT_AMBULATORY_CARE_PROVIDER_SITE_OTHER): Payer: 59 | Admitting: Internal Medicine

## 2011-10-17 VITALS — BP 130/74 | Temp 97.6°F | Wt 239.0 lb

## 2011-10-17 DIAGNOSIS — N410 Acute prostatitis: Secondary | ICD-10-CM

## 2011-10-17 DIAGNOSIS — R300 Dysuria: Secondary | ICD-10-CM

## 2011-10-17 LAB — POCT URINALYSIS DIPSTICK
Ketones, UA: NEGATIVE
Spec Grav, UA: 1.02
pH, UA: 6

## 2011-10-17 MED ORDER — DOXYCYCLINE HYCLATE 100 MG PO TABS: 100.0000 mg | ORAL_TABLET | Freq: Two times a day (BID) | ORAL | Status: AC

## 2011-10-17 MED ORDER — HYDROCODONE-ACETAMINOPHEN 7.5-500 MG PO TABS: 1.0000 | ORAL_TABLET | Freq: Four times a day (QID) | ORAL | Status: DC | PRN

## 2011-10-17 NOTE — Patient Instructions (Signed)
Take your antibiotic as prescribed until ALL of it is gone, but stop if you develop a rash, swelling, or any side effects of the medication.  Contact our office as soon as possible if  there are side effects of the medication.  Call or return to clinic prn if these symptoms worsen or fail to improve as anticipated.  

## 2011-10-17 NOTE — Progress Notes (Signed)
  Subjective:    Patient ID: [name], male    DOB: 06-Apr-1951, 61 y.o.   MRN: [id number]  HPI A 61 year old patient who is seen today for followup of acute prostatitis. He was initially seen in December with acute prostatitis and placed on Cipro. He had a poor response and was seen in January and placed on Bactrim DS patient's examination at that time did confirm an enlarged tender prostate. He was then treated with Bactrim DS for 30 days and had a nice response he completed 30 days of therapy the past 5 or 6 days has had recurrent perineal pain worse at night. He describes achiness and a flu like syndrome.   Review of Systems  Constitutional: Positive for fatigue. Negative for fever, chills and appetite change.  HENT: Negative for hearing loss, ear pain, congestion, sore throat, trouble swallowing, neck stiffness, dental problem, voice change and tinnitus.   Eyes: Negative for pain, discharge and visual disturbance.  Respiratory: Negative for cough, chest tightness, wheezing and stridor.   Cardiovascular: Negative for chest pain, palpitations and leg swelling.  Gastrointestinal: Negative for nausea, vomiting, abdominal pain, diarrhea, constipation, blood in stool and abdominal distention.  Genitourinary: Positive for urgency. Negative for hematuria, flank pain, discharge, difficulty urinating and genital sores. Penile pain: perineal discomfort.  Musculoskeletal: Negative for myalgias, back pain, joint swelling, arthralgias and gait problem.  Skin: Negative for rash.  Neurological: Positive for weakness. Negative for dizziness, syncope, speech difficulty, numbness and headaches.  Hematological: Negative for adenopathy. Does not bruise/bleed easily.  Psychiatric/Behavioral: Negative for behavioral problems and dysphoric mood. The patient is not nervous/anxious.        Objective:   Physical Exam  Constitutional: He appears well-developed and well-nourished. No distress.            Assessment & Plan:   Recurrent prostatitis.  We'll treat with doxycycline for 60 days. Samples for the next few days dispensed. Will call if unimproved. We'll consider referral back to urology if remain symptomatic

## 2011-12-12 ENCOUNTER — Encounter: Payer: Self-pay | Admitting: Family Medicine

## 2011-12-12 ENCOUNTER — Ambulatory Visit (INDEPENDENT_AMBULATORY_CARE_PROVIDER_SITE_OTHER): Payer: 59 | Admitting: Family Medicine

## 2011-12-12 VITALS — BP 130/80 | HR 81 | Temp 98.4°F | Wt 238.0 lb

## 2011-12-12 DIAGNOSIS — B356 Tinea cruris: Secondary | ICD-10-CM

## 2011-12-12 DIAGNOSIS — N411 Chronic prostatitis: Secondary | ICD-10-CM

## 2011-12-12 MED ORDER — KETOCONAZOLE 2 % EX CREA: TOPICAL_CREAM | Freq: Two times a day (BID) | CUTANEOUS | Status: DC

## 2011-12-12 NOTE — Progress Notes (Signed)
  Subjective:    Patient ID: [name], male    DOB: [birth year], 61 y.o.   MRN: [id number]  HPI Here for 2 weeks of an itchy rash in the perineum. OTC creams do not help. He has been on a lot of antibiotics lately for prostatitis. This seems to be resolved for now.    Review of Systems  Skin: Positive for rash.       Objective:   Physical Exam  Constitutional: He appears well-developed and well-nourished.  Skin:       Red macular rash over the scrotum and both groin areas           Assessment & Plan:  Keep the area as dry as possible

## 2011-12-19 ENCOUNTER — Emergency Department (HOSPITAL_COMMUNITY): Payer: 59

## 2011-12-19 ENCOUNTER — Encounter (HOSPITAL_COMMUNITY): Payer: Self-pay | Admitting: Anesthesiology

## 2011-12-19 ENCOUNTER — Inpatient Hospital Stay (HOSPITAL_COMMUNITY)
Admission: EM | Admit: 2011-12-19 | Discharge: 2012-01-02 | DRG: 329 | Disposition: A | Discharge status: Home Health | Payer: 59 | Attending: Surgery | Admitting: Surgery

## 2011-12-19 ENCOUNTER — Inpatient Hospital Stay (HOSPITAL_COMMUNITY): Payer: 59 | Admitting: Anesthesiology

## 2011-12-19 ENCOUNTER — Encounter (HOSPITAL_COMMUNITY): Admission: EM | Disposition: A | Discharge status: Home Health | Payer: Self-pay | Source: Home / Self Care

## 2011-12-19 ENCOUNTER — Inpatient Hospital Stay (HOSPITAL_COMMUNITY): Payer: 59

## 2011-12-19 ENCOUNTER — Encounter (HOSPITAL_COMMUNITY): Payer: Self-pay | Admitting: *Deleted

## 2011-12-19 DIAGNOSIS — K912 Postsurgical malabsorption, not elsewhere classified: Secondary | ICD-10-CM | POA: Diagnosis not present

## 2011-12-19 DIAGNOSIS — E876 Hypokalemia: Secondary | ICD-10-CM | POA: Diagnosis not present

## 2011-12-19 DIAGNOSIS — E875 Hyperkalemia: Secondary | ICD-10-CM | POA: Diagnosis present

## 2011-12-19 DIAGNOSIS — E785 Hyperlipidemia, unspecified: Secondary | ICD-10-CM | POA: Diagnosis present

## 2011-12-19 DIAGNOSIS — R000 Tachycardia, unspecified: Secondary | ICD-10-CM | POA: Diagnosis present

## 2011-12-19 DIAGNOSIS — Z9889 Other specified postprocedural states: Secondary | ICD-10-CM

## 2011-12-19 DIAGNOSIS — R109 Unspecified abdominal pain: Secondary | ICD-10-CM

## 2011-12-19 DIAGNOSIS — Z9049 Acquired absence of other specified parts of digestive tract: Secondary | ICD-10-CM

## 2011-12-19 DIAGNOSIS — K567 Ileus, unspecified: Secondary | ICD-10-CM

## 2011-12-19 DIAGNOSIS — J95821 Acute postprocedural respiratory failure: Secondary | ICD-10-CM | POA: Diagnosis present

## 2011-12-19 DIAGNOSIS — N138 Other obstructive and reflux uropathy: Secondary | ICD-10-CM | POA: Diagnosis present

## 2011-12-19 DIAGNOSIS — K658 Other peritonitis: Secondary | ICD-10-CM | POA: Diagnosis present

## 2011-12-19 DIAGNOSIS — A419 Sepsis, unspecified organism: Secondary | ICD-10-CM | POA: Diagnosis present

## 2011-12-19 DIAGNOSIS — I498 Other specified cardiac arrhythmias: Secondary | ICD-10-CM | POA: Diagnosis present

## 2011-12-19 DIAGNOSIS — K5792 Diverticulitis of intestine, part unspecified, without perforation or abscess without bleeding: Secondary | ICD-10-CM | POA: Diagnosis present

## 2011-12-19 DIAGNOSIS — E46 Unspecified protein-calorie malnutrition: Secondary | ICD-10-CM | POA: Diagnosis not present

## 2011-12-19 DIAGNOSIS — K631 Perforation of intestine (nontraumatic): Secondary | ICD-10-CM

## 2011-12-19 DIAGNOSIS — J984 Other disorders of lung: Secondary | ICD-10-CM | POA: Diagnosis not present

## 2011-12-19 DIAGNOSIS — J069 Acute upper respiratory infection, unspecified: Secondary | ICD-10-CM

## 2011-12-19 DIAGNOSIS — K460 Unspecified abdominal hernia with obstruction, without gangrene: Secondary | ICD-10-CM

## 2011-12-19 DIAGNOSIS — Z8719 Personal history of other diseases of the digestive system: Secondary | ICD-10-CM

## 2011-12-19 DIAGNOSIS — K436 Other and unspecified ventral hernia with obstruction, without gangrene: Secondary | ICD-10-CM | POA: Diagnosis present

## 2011-12-19 DIAGNOSIS — R079 Chest pain, unspecified: Secondary | ICD-10-CM

## 2011-12-19 DIAGNOSIS — K659 Peritonitis, unspecified: Secondary | ICD-10-CM | POA: Diagnosis present

## 2011-12-19 DIAGNOSIS — K430 Incisional hernia with obstruction, without gangrene: Secondary | ICD-10-CM

## 2011-12-19 DIAGNOSIS — K560 Paralytic ileus: Secondary | ICD-10-CM | POA: Diagnosis present

## 2011-12-19 DIAGNOSIS — K9189 Other postprocedural complications and disorders of digestive system: Secondary | ICD-10-CM

## 2011-12-19 DIAGNOSIS — J019 Acute sinusitis, unspecified: Secondary | ICD-10-CM

## 2011-12-19 DIAGNOSIS — K219 Gastro-esophageal reflux disease without esophagitis: Secondary | ICD-10-CM | POA: Diagnosis present

## 2011-12-19 DIAGNOSIS — N289 Disorder of kidney and ureter, unspecified: Secondary | ICD-10-CM | POA: Diagnosis not present

## 2011-12-19 DIAGNOSIS — L408 Other psoriasis: Secondary | ICD-10-CM | POA: Diagnosis present

## 2011-12-19 DIAGNOSIS — J209 Acute bronchitis, unspecified: Secondary | ICD-10-CM

## 2011-12-19 DIAGNOSIS — N401 Enlarged prostate with lower urinary tract symptoms: Secondary | ICD-10-CM | POA: Diagnosis present

## 2011-12-19 DIAGNOSIS — S31109A Unspecified open wound of abdominal wall, unspecified quadrant without penetration into peritoneal cavity, initial encounter: Secondary | ICD-10-CM

## 2011-12-19 DIAGNOSIS — R6520 Severe sepsis without septic shock: Secondary | ICD-10-CM | POA: Diagnosis present

## 2011-12-19 DIAGNOSIS — J45909 Unspecified asthma, uncomplicated: Secondary | ICD-10-CM

## 2011-12-19 DIAGNOSIS — Z6836 Body mass index (BMI) 36.0-36.9, adult: Secondary | ICD-10-CM

## 2011-12-19 HISTORY — PX: BOWEL RESECTION: SHX1257

## 2011-12-19 HISTORY — DX: Ileus, unspecified: K56.7

## 2011-12-19 HISTORY — DX: Other postprocedural complications and disorders of digestive system: K91.89

## 2011-12-19 HISTORY — PX: LAPAROTOMY: SHX154

## 2011-12-19 LAB — URINALYSIS, ROUTINE W REFLEX MICROSCOPIC
Bilirubin Urine: NEGATIVE
Glucose, UA: NEGATIVE mg/dL
Hgb urine dipstick: NEGATIVE
Ketones, ur: NEGATIVE mg/dL
Protein, ur: NEGATIVE mg/dL
pH: 5.5 (ref 5.0–8.0)

## 2011-12-19 LAB — TYPE AND SCREEN: Antibody Screen: NEGATIVE

## 2011-12-19 LAB — DIFFERENTIAL
Basophils Absolute: 0.1 10*3/uL (ref 0.0–0.1)
Basophils Relative: 0 % (ref 0–1)
Lymphocytes Relative: 17 % (ref 12–46)
Monocytes Absolute: 1.4 10*3/uL — ABNORMAL HIGH (ref 0.1–1.0)
Monocytes Relative: 7 % (ref 3–12)
Neutro Abs: 15.7 10*3/uL — ABNORMAL HIGH (ref 1.7–7.7)
Neutrophils Relative %: 76 % (ref 43–77)

## 2011-12-19 LAB — BLOOD GAS, ARTERIAL
Bicarbonate: 17.9 mEq/L — ABNORMAL LOW (ref 20.0–24.0)
Drawn by: 295031
O2 Saturation: 93.5 %
PEEP: 5 cmH2O
RATE: 12 resp/min
pCO2 arterial: 46.7 mmHg — ABNORMAL HIGH (ref 35.0–45.0)
pH, Arterial: 7.207 — ABNORMAL LOW (ref 7.350–7.450)
pO2, Arterial: 78.1 mmHg — ABNORMAL LOW (ref 80.0–100.0)

## 2011-12-19 LAB — CBC
HCT: 53.2 % — ABNORMAL HIGH (ref 39.0–52.0)
Hemoglobin: 18.1 g/dL — ABNORMAL HIGH (ref 13.0–17.0)
MCHC: 34 g/dL (ref 30.0–36.0)
RDW: 13.7 % (ref 11.5–15.5)
WBC: 20.8 10*3/uL — ABNORMAL HIGH (ref 4.0–10.5)

## 2011-12-19 LAB — CARBOXYHEMOGLOBIN
Carboxyhemoglobin: 1.4 % (ref 0.5–1.5)
O2 Saturation: 73.1 %

## 2011-12-19 LAB — COMPREHENSIVE METABOLIC PANEL
AST: 76 U/L — ABNORMAL HIGH (ref 0–37)
Albumin: 4.3 g/dL (ref 3.5–5.2)
Alkaline Phosphatase: 87 U/L (ref 39–117)
CO2: 24 mEq/L (ref 19–32)
Chloride: 104 mEq/L (ref 96–112)
Creatinine, Ser: 1 mg/dL (ref 0.50–1.35)
GFR calc non Af Amer: 81 mL/min — ABNORMAL LOW (ref >90)
Potassium: 3.8 mEq/L (ref 3.5–5.1)
Total Bilirubin: 0.4 mg/dL (ref 0.3–1.2)

## 2011-12-19 LAB — ABO/RH: ABO/RH(D): B POS

## 2011-12-19 LAB — PROCALCITONIN: Procalcitonin: 3.63 ng/mL

## 2011-12-19 SURGERY — LAPAROTOMY, EXPLORATORY
Anesthesia: General | Site: Abdomen | Wound class: Dirty or Infected

## 2011-12-19 MED ORDER — FENTANYL CITRATE 0.05 MG/ML IJ SOLN
INTRAMUSCULAR | Status: DC | PRN
  Administered 2011-12-19 (×2): 50 ug via INTRAVENOUS
  Administered 2011-12-19: 100 ug via INTRAVENOUS
  Administered 2011-12-19: 50 ug via INTRAVENOUS

## 2011-12-19 MED ORDER — HYDROMORPHONE HCL PF 1 MG/ML IJ SOLN
1.0000 mg | Freq: Once | INTRAMUSCULAR | Status: AC
  Administered 2011-12-19: 1 mg via INTRAVENOUS
  Filled 2011-12-19 (×2): qty 1

## 2011-12-19 MED ORDER — HYDROMORPHONE HCL PF 1 MG/ML IJ SOLN
INTRAMUSCULAR | Status: DC | PRN
  Administered 2011-12-19 (×2): 1 mg via INTRAVENOUS

## 2011-12-19 MED ORDER — MIDAZOLAM HCL 2 MG/2ML IJ SOLN: 2.0000 mg | INTRAMUSCULAR | Status: DC | PRN

## 2011-12-19 MED ORDER — SODIUM CHLORIDE 0.9 % IV SOLN
INTRAVENOUS | Status: DC
  Administered 2011-12-19 (×2): via INTRAVENOUS

## 2011-12-19 MED ORDER — MIDAZOLAM HCL 5 MG/ML IJ SOLN
INTRAMUSCULAR | Status: AC
  Administered 2011-12-19: 4 mg
  Filled 2011-12-19: qty 1

## 2011-12-19 MED ORDER — ERTAPENEM SODIUM 1 G IJ SOLR
1.0000 g | INTRAMUSCULAR | Status: DC
  Administered 2011-12-19 – 2012-01-01 (×14): 1 g via INTRAVENOUS
  Filled 2011-12-19 (×14): qty 1

## 2011-12-19 MED ORDER — SODIUM CHLORIDE 0.9 % IV SOLN
50.0000 ug/h | INTRAVENOUS | Status: DC
  Administered 2011-12-19: 100 ug/h via INTRAVENOUS
  Administered 2011-12-20: 200 ug/h via INTRAVENOUS
  Administered 2011-12-21: 50 ug/h via INTRAVENOUS
  Administered 2011-12-21: 175 ug/h via INTRAVENOUS
  Filled 2011-12-19 (×5): qty 50

## 2011-12-19 MED ORDER — FENTANYL CITRATE 0.05 MG/ML IJ SOLN
INTRAMUSCULAR | Status: AC
  Filled 2011-12-19: qty 2

## 2011-12-19 MED ORDER — ONDANSETRON HCL 4 MG/2ML IJ SOLN
4.0000 mg | Freq: Once | INTRAMUSCULAR | Status: AC
  Administered 2011-12-19: 4 mg via INTRAVENOUS

## 2011-12-19 MED ORDER — HYDROMORPHONE HCL PF 1 MG/ML IJ SOLN: 0.2500 mg | INTRAMUSCULAR | Status: DC | PRN

## 2011-12-19 MED ORDER — SUCCINYLCHOLINE CHLORIDE 20 MG/ML IJ SOLN
INTRAMUSCULAR | Status: DC | PRN
  Administered 2011-12-19: 200 mg via INTRAVENOUS

## 2011-12-19 MED ORDER — DOBUTAMINE IN D5W 4-5 MG/ML-% IV SOLN: 2.5000 ug/kg/min | INTRAVENOUS | Status: DC | PRN

## 2011-12-19 MED ORDER — METOPROLOL TARTRATE 1 MG/ML IV SOLN
2.5000 mg | INTRAVENOUS | Status: DC | PRN
  Filled 2011-12-19: qty 5

## 2011-12-19 MED ORDER — FENTANYL CITRATE 0.05 MG/ML IJ SOLN
50.0000 ug | INTRAMUSCULAR | Status: DC | PRN
  Administered 2011-12-19 (×2): 50 ug via INTRAVENOUS

## 2011-12-19 MED ORDER — FENTANYL BOLUS VIA INFUSION
50.0000 ug | Freq: Four times a day (QID) | INTRAVENOUS | Status: DC | PRN
  Administered 2011-12-20: 50 ug via INTRAVENOUS
  Filled 2011-12-19: qty 100

## 2011-12-19 MED ORDER — SODIUM CHLORIDE 0.9 % IV BOLUS (SEPSIS)
500.0000 mL | INTRAVENOUS | Status: DC | PRN
  Administered 2011-12-19: 500 mL via INTRAVENOUS

## 2011-12-19 MED ORDER — HYDROMORPHONE HCL PF 2 MG/ML IJ SOLN
2.0000 mg | Freq: Once | INTRAMUSCULAR | Status: AC
  Administered 2011-12-19: 2 mg via INTRAMUSCULAR
  Filled 2011-12-19: qty 1

## 2011-12-19 MED ORDER — PROMETHAZINE HCL 25 MG/ML IJ SOLN: 6.2500 mg | INTRAMUSCULAR | Status: DC | PRN

## 2011-12-19 MED ORDER — FENTANYL CITRATE 0.05 MG/ML IJ SOLN
50.0000 ug | INTRAMUSCULAR | Status: DC | PRN
  Administered 2011-12-19 (×2): 100 ug via INTRAVENOUS
  Filled 2011-12-19 (×4): qty 2

## 2011-12-19 MED ORDER — MORPHINE SULFATE 2 MG/ML IJ SOLN
1.0000 mg | INTRAMUSCULAR | Status: DC | PRN
  Administered 2011-12-19: 4 mg via INTRAVENOUS
  Filled 2011-12-19: qty 2

## 2011-12-19 MED ORDER — HYDROMORPHONE HCL PF 1 MG/ML IJ SOLN
1.0000 mg | Freq: Once | INTRAMUSCULAR | Status: AC
  Administered 2011-12-19: 1 mg via INTRAVENOUS
  Filled 2011-12-19: qty 1

## 2011-12-19 MED ORDER — SODIUM CHLORIDE 0.9 % IV BOLUS (SEPSIS)
1000.0000 mL | Freq: Once | INTRAVENOUS | Status: AC
  Administered 2011-12-19: 1000 mL via INTRAVENOUS

## 2011-12-19 MED ORDER — PROMETHAZINE HCL 25 MG/ML IJ SOLN
12.5000 mg | Freq: Four times a day (QID) | INTRAMUSCULAR | Status: DC | PRN
  Administered 2011-12-25 – 2011-12-27 (×3): 12.5 mg via INTRAVENOUS
  Filled 2011-12-19 (×4): qty 1

## 2011-12-19 MED ORDER — ROCURONIUM BROMIDE 100 MG/10ML IV SOLN
INTRAVENOUS | Status: DC | PRN
  Administered 2011-12-19 (×4): 50 mg via INTRAVENOUS

## 2011-12-19 MED ORDER — ONDANSETRON HCL 4 MG/2ML IJ SOLN
4.0000 mg | Freq: Four times a day (QID) | INTRAMUSCULAR | Status: DC | PRN
  Administered 2011-12-24 – 2011-12-30 (×7): 4 mg via INTRAVENOUS
  Filled 2011-12-19 (×7): qty 2

## 2011-12-19 MED ORDER — LACTATED RINGERS IV SOLN
INTRAVENOUS | Status: DC
  Administered 2011-12-19 – 2011-12-20 (×2): via INTRAVENOUS

## 2011-12-19 MED ORDER — NOREPINEPHRINE BITARTRATE 1 MG/ML IJ SOLN
2.0000 ug/min | INTRAVENOUS | Status: DC | PRN
  Filled 2011-12-19: qty 4

## 2011-12-19 MED ORDER — PANTOPRAZOLE SODIUM 40 MG IV SOLR
40.0000 mg | Freq: Every day | INTRAVENOUS | Status: DC
  Administered 2011-12-19 – 2011-12-30 (×12): 40 mg via INTRAVENOUS
  Filled 2011-12-19 (×13): qty 40

## 2011-12-19 MED ORDER — MIDAZOLAM HCL 5 MG/ML IJ SOLN
INTRAMUSCULAR | Status: AC
  Administered 2011-12-19: 5 mg
  Filled 2011-12-19: qty 1

## 2011-12-19 MED ORDER — IOHEXOL 300 MG/ML  SOLN
100.0000 mL | Freq: Once | INTRAMUSCULAR | Status: AC | PRN
  Administered 2011-12-19: 100 mL via INTRAVENOUS

## 2011-12-19 MED ORDER — SODIUM CHLORIDE 0.9 % IR SOLN
Status: DC | PRN
  Administered 2011-12-19: 1000 mL

## 2011-12-19 MED ORDER — ONDANSETRON HCL 4 MG/2ML IJ SOLN
4.0000 mg | Freq: Once | INTRAMUSCULAR | Status: AC
  Administered 2011-12-19: 4 mg via INTRAVENOUS
  Filled 2011-12-19: qty 2
  Filled 2011-12-19: qty 4

## 2011-12-19 MED ORDER — LACTATED RINGERS IV SOLN
INTRAVENOUS | Status: DC | PRN
  Administered 2011-12-19 (×2): via INTRAVENOUS

## 2011-12-19 MED ORDER — SODIUM CHLORIDE 0.9 % IV BOLUS (SEPSIS)
500.0000 mL | Freq: Once | INTRAVENOUS | Status: AC
  Administered 2011-12-19: 500 mL via INTRAVENOUS

## 2011-12-19 MED ORDER — PROPOFOL 10 MG/ML IV EMUL
INTRAVENOUS | Status: DC | PRN
  Administered 2011-12-19: 200 mg via INTRAVENOUS

## 2011-12-19 MED ORDER — LACTATED RINGERS IV SOLN
INTRAVENOUS | Status: DC | PRN
  Administered 2011-12-19 (×4): via INTRAVENOUS

## 2011-12-19 MED ORDER — MIDAZOLAM HCL 5 MG/5ML IJ SOLN
INTRAMUSCULAR | Status: DC | PRN
  Administered 2011-12-19 (×2): 2 mg via INTRAVENOUS

## 2011-12-19 MED ORDER — METOPROLOL TARTRATE 1 MG/ML IV SOLN
2.5000 mg | INTRAVENOUS | Status: DC | PRN
  Administered 2011-12-19 – 2011-12-20 (×2): 2.5 mg via INTRAVENOUS
  Administered 2011-12-21: 5 mg via INTRAVENOUS
  Filled 2011-12-19 (×3): qty 5

## 2011-12-19 SURGICAL SUPPLY — 42 items
APL SKNCLS STERI-STRIP NONHPOA (GAUZE/BANDAGES/DRESSINGS) ×1
APPLICATOR COTTON TIP 6IN STRL (MISCELLANEOUS) ×1 IMPLANT
BENZOIN TINCTURE PRP APPL 2/3 (GAUZE/BANDAGES/DRESSINGS) ×1 IMPLANT
BLADE EXTENDED COATED 6.5IN (ELECTRODE) IMPLANT
BLADE HEX COATED 2.75 (ELECTRODE) ×2 IMPLANT
CANISTER SUCTION 2500CC (MISCELLANEOUS) ×6 IMPLANT
CLOTH BEACON ORANGE TIMEOUT ST (SAFETY) ×2 IMPLANT
COVER MAYO STAND STRL (DRAPES) ×1 IMPLANT
DRAPE LAPAROSCOPIC ABDOMINAL (DRAPES) ×2 IMPLANT
DRAPE WARM FLUID 44X44 (DRAPE) ×1 IMPLANT
DRSG VAC ATS MED SENSATRAC (GAUZE/BANDAGES/DRESSINGS) ×2 IMPLANT
ELECT REM PT RETURN 9FT ADLT (ELECTROSURGICAL) ×2
ELECTRODE REM PT RTRN 9FT ADLT (ELECTROSURGICAL) ×1 IMPLANT
GLOVE BIOGEL PI IND STRL 7.0 (GLOVE) ×1 IMPLANT
GLOVE BIOGEL PI INDICATOR 7.0 (GLOVE) ×1
GOWN STRL NON-REIN LRG LVL3 (GOWN DISPOSABLE) ×2 IMPLANT
GOWN STRL REIN XL XLG (GOWN DISPOSABLE) ×4 IMPLANT
GRAFT FLEX HD 12X12 THICK (Tissue Mesh) ×1 IMPLANT
KIT BASIN OR (CUSTOM PROCEDURE TRAY) ×2 IMPLANT
LIGASURE IMPACT 36 18CM CVD LR (INSTRUMENTS) ×1 IMPLANT
MARKER SKIN DUAL TIP RULER LAB (MISCELLANEOUS) ×1 IMPLANT
NS IRRIG 1000ML POUR BTL (IV SOLUTION) ×9 IMPLANT
PACK GENERAL/GYN (CUSTOM PROCEDURE TRAY) ×2 IMPLANT
RELOAD PROXIMATE 75MM BLUE (ENDOMECHANICALS) ×6 IMPLANT
RELOAD STAPLE 75 3.8 BLU REG (ENDOMECHANICALS) IMPLANT
SPONGE GAUZE 4X4 12PLY (GAUZE/BANDAGES/DRESSINGS) ×2 IMPLANT
SPONGE LAP 18X18 X RAY DECT (DISPOSABLE) ×2 IMPLANT
STAPLER GUN LINEAR PROX 60 (STAPLE) ×1 IMPLANT
STAPLER PROXIMATE 75MM BLUE (STAPLE) ×1 IMPLANT
STAPLER VISISTAT 35W (STAPLE) ×2 IMPLANT
SUCTION POOLE TIP (SUCTIONS) ×1 IMPLANT
SUT NOVA 1 T20/GS 25DT (SUTURE) ×7 IMPLANT
SUT PDS AB 1 CTX 36 (SUTURE) IMPLANT
SUT SILK 2 0 (SUTURE) ×2
SUT SILK 2 0 SH CR/8 (SUTURE) ×1 IMPLANT
SUT SILK 2-0 18XBRD TIE 12 (SUTURE) IMPLANT
SUT SILK 3 0 (SUTURE) ×2
SUT SILK 3 0 SH CR/8 (SUTURE) ×1 IMPLANT
SUT SILK 3-0 18XBRD TIE 12 (SUTURE) IMPLANT
TOWEL OR 17X26 10 PK STRL BLUE (TOWEL DISPOSABLE) ×4 IMPLANT
TRAY FOLEY CATH 14FRSI W/METER (CATHETERS) ×1 IMPLANT
YANKAUER SUCT BULB TIP NO VENT (SUCTIONS) ×1 IMPLANT

## 2011-12-19 NOTE — Progress Notes (Signed)
[facility] Progress Note Patient Name: [name] DOB: [birth year] MRN: [id number]  Date of Service  12/19/2011   HPI/Events of Note  Sepsis alert firing for high lactate  eICU Interventions  Recheck lactic acid, if still elevated will start shock protocol   Intervention Category Major Interventions: Sepsis - evaluation and management  Asencion Noble 12/19/2011, 6:08 PM

## 2011-12-19 NOTE — H&P (Signed)
Patient interviewed and examined, agree with PA note above. Patient is going for CT scan at the time of examination. He appears to very likely have an incarcerated recurrent ventral hernia with bowel obstruction. I discussed with him that we would proceed with emergency surgery as quickly as possible. We discussed the nature of the procedure and indications as well as risks of anesthetic complications, bleeding, infection, bowel resection, and possible ostomy. [name] MD, FACS  12/19/2011 6:34 PM

## 2011-12-19 NOTE — ED Notes (Signed)
Attempted to start IV x2 but unsuccessful IV team called.

## 2011-12-19 NOTE — ED Notes (Addendum)
Hx of graft in 06, diverticulitis, half of colon removed. States he had pain before but never this bad. Abd hard and more distended than usual, mostly to RUQ. Last BM this am. IV in left hand. Received Zofran in EMS. BP per EMS 170/104.

## 2011-12-19 NOTE — ED Notes (Addendum)
Patient transported to X-ray 

## 2011-12-19 NOTE — ED Provider Notes (Signed)
Assumed care from Dr. Sharol Given. Patient's had multiple normal surgeries. He has a history of incisional hernia. He presents with diffuse abdominal pain which is the worst it ever experienced. He has severe abdominal pain worst when pressing on his right incisional hernia. Diminished bowel sounds. Passing a small amount of flatus. Had a bowel movement last evening. In imaging and laboratory results. We'll control pain and anti-emetics will be given. Will likely require surgery consult.  Trisha Mangle, MD 12/19/11 [phone]

## 2011-12-19 NOTE — ED Notes (Signed)
Pt said he is unable to urinate right now

## 2011-12-19 NOTE — Transfer of Care (Signed)
Immediate Anesthesia Transfer of Care Note  Patient: [name]  Procedure(s) Performed: Procedure(s) (LRB): EXPLORATORY LAPAROTOMY (N/A) SMALL BOWEL RESECTION (N/A)  Patient Location: PACU and ICU  Anesthesia Type: General  Level of Consciousness: sedated and unresponsive  Airway & Oxygen Therapy: Patient remains intubated per anesthesia plan  Post-op Assessment: Report given to PACU RN and Post -op Vital signs reviewed and stable  Post vital signs: Reviewed and stable  Complications: No apparent anesthesia complications

## 2011-12-19 NOTE — Procedures (Signed)
Central Venous Catheter Insertion Procedure Note [name] [id number] March 07, 1951  Procedure: Insertion of Central Venous Catheter Indications: Assessment of intravascular volume and Drug and/or fluid administration  Procedure Details Consent: Risks of procedure as well as the alternatives and risks of each were explained to the (patient/caregiver).  Consent for procedure obtained. Time Out: Verified patient identification, verified procedure, site/side was marked, verified correct patient position, special equipment/implants available, medications/allergies/relevent history reviewed, required imaging and test results available.  Performed  Maximum sterile technique was used including antiseptics, cap, gloves, gown, hand hygiene, mask and sheet. Skin prep: Chlorhexidine; local anesthetic administered A antimicrobial bonded/coated triple lumen catheter was placed in the left internal jugular vein using the Seldinger technique. Ultrasound used for vessel identification.  Evaluation Blood flow good Complications: No apparent complications Patient did tolerate procedure well. Chest X-ray ordered to verify placement.  CXR: pending.  [name] 12/19/2011, 10:06 PM

## 2011-12-19 NOTE — Op Note (Signed)
Preoperative Diagnosis: Perforated bowel [569.83] Incarcerated hernia [552.9] Abdominal pain [789.00] abd pain incarcerated ventral hernia  Postoprative Diagnosis: Perforated bowel [569.83] Incarcerated hernia [552.9] Abdominal pain [789.00] abd pain incarcerated ventral hernia  Procedure: Procedure(s): EXPLORATORY LAPAROTOMY SMALL BOWEL RESECTION  repair of ventral hernia with biologic material VAC wound dressing Surgeon: Excell Seltzer T   Assistants: Rolm Bookbinder M.D.  Anesthesia:  General endotracheal anesthesiaDiagnos  Indications:   Patient is a 61 year old male with a remote history of colectomy for diverticulitis at [facility]. This was apparently complicated requiring a temporary ostomy and open abdomen which was subsequently repaired. He however had a ventral hernia repaired with mesh here by Dr. Hassell Done approximately 5 years ago. He did well but recently has had some intermittent pain in his mid abdomen. He presented to the emergency room today with about 12 hours of severe diffuse abdominal pain. CT scan shows an incarcerated hernia in the right mid abdomen with multiple loops of small bowel and evidence of perforation. Exam is consistent with this and we have recommended emergency laparotomy, probable bowel resection. I discussed the risks of the surgery including anesthetic complications, bleeding, infection, probable ventilator management postoperatively, anastomotic leak, wound healing problems or recurrent hernia. He understands all this well and desires to proceed.  Procedure Detail:  Patient is brought to the operating room, placed in the supine position on the operating table and general endotracheal anesthesia induced. Nasogastric tube and Foley catheter were placed. The abdomen was widely sterilely prepped and draped. There was an obvious mass just to the right of the umbilicus at the site of the incarcerated hernia. The midline incision was used and dissection  carried down through the subcutaneous tissue to the level of the fascia. The mesh was an on lay and well incorporated. I deepened the dissection through the fascia and was able to enter the free peritoneal cavity with minimal difficulty. There was obvious peritonitis and bilious fluid in the abdomen. Working over toward the right side of the abdomen I cleared the abdominal wall easily above and below the incarcerated hernia defect and then with careful sharp dissection and blunt dissection released a long loop of small bowel from the hernia defect which measured about 6 or 7 cm in diameter. This bowel was gangrenous and perforated. The remainder of the anterior abdominal wall was cleared in both directions. The abdomen was irrigated and suctioned. Points of proximal and distal resection of the small bowel were chosen after freeing some further small bowel adhesions. This was a relatively normal although somewhat secondarily inflamed bowel. This required about a 2 foot resection. The bowel was divided with the GIA 75 stapler at either side and the mesentery was divided with the LigaSure were in the specimen removed. A functional end-to-end anastomosis was created with the GI 75 mm stapler and the common enterotomy closed with the TA 60 stapler. The anastomosis was airtight to pressure it appeared well perfused. The mesenteric defect was closed with interrupted silks. The abdomen was then copiously irrigated with multiple liters of warm saline until clear. The remainder of the small bowel was examined and although secondarily inflamed was otherwise normal. There were a couple of other smaller Swiss cheese defects on the right side of the abdomen adjacent to the large hernia and a couple somewhat larger 3-4 cm Swiss cheese defects along the left of the midline. With the large defect I did not feel we be able to bring the fascia back to the midline. After completely debriding the  hernia sac and subcutaneous tissue over  the bowel perforated and further irrigating I chose a 12 x 12 cm piece of acellular dermis. This was oriented and then I used this as an underlay initially suturing it back lateral to the large defect on the right side and to the edge of the defect as well and then with intra-abdominal mattress sutures taken deeply up into the fascia the mesh was sutured circumferentially back over and beyond all the defects on either side. I made sure there were no gaps in the closure to allow bowel to herniate. The midline fascia was then closed over the biologic material superiorly and inferiorly working toward the large central hernia defect which was not closed due to the tension and the biologic material bridged about 5 cm in this area.the wound was irrigated and a VAC sponge trimmed to fit the defect and VAC dressing applied. Sponge needle and sponge counts were correct. The patient was transported in stable condition intubated to the intensive care unit.  Findings: As above  Estimated Blood Loss:  200 mL         Drains: VAC wound dressing  Blood Given: none          Specimens: segment of small intestine        Complications:  * No complications entered in OR log *         Disposition: ICU - intubated and hemodynamically stable.         Condition: stable  [name] MD, FACS  12/19/2011, 3:42 PM

## 2011-12-19 NOTE — ED Notes (Signed)
Pt stated he is in too much pain to urinate

## 2011-12-19 NOTE — Progress Notes (Signed)
[facility] PCCM  I was called to the bedside for CVL placement.  Pt noted to be awake and uncomfortable on intermittent sedation protocol.  Will change sedation to continuous fentanyl gtt overnight, prn versed.  Sepsis protocol ordered per [facility].  Jillyn Hidden PCCM Pager: [phone] Cell: [phone] If no response, call [phone]

## 2011-12-19 NOTE — ED Notes (Signed)
Patient returned from X-ray 

## 2011-12-19 NOTE — Consult Note (Signed)
Name: [name] MRN: [id number] DOB: 1950-12-04    LOS: 0  Referring Provider:  Olen Pel Reason for Referral:  Postop vent management  PULMONARY / CRITICAL CARE MEDICINE  Pt Profile: Admitted to ICU post laparotomy for bowel perforation and small bowel resection. PCCM asked to assist in post op vent and CCM issues  HPI:  61 year old gentleman with a complicated abdominal history. In 2005 he underwent a colon resection for diverticulitis at [facility]. With multiple postop complications including peritonitis, open abdominal wound, ileostomy. In 2006 he had the ileostomy revised. Later in the year he underwent a ventral hernia repair by Dr. Hassell Done with anterior fascial repair using ultra Pro mesh Pt awoke early AM 12/19/11 with acute abdominal pain. Found to have small bowel perforation at time of ex lap 6/4.  CCM consulted postop.  Past Medical History  Diagnosis Date  . Diverticulitis of colon   . Hepatitis 1975    unknown type   . Asthma   . GERD (gastroesophageal reflux disease)   . Hiatal hernia   . BPH (benign prostatic hypertrophy) with urinary obstruction   . Psoriasis     sees [name] at [facility].   Past Surgical History  Procedure Date  . Hemicolectomy     left side, at [facility]  . Ileostomy   . Colonoscopy   . Cystoscopy   . Bronchoscopy   . Tonsillectomy   . Vasectomy   . Hernia repair     incisional hernia   Prior to Admission medications   Not on File   Allergies Allergies  Allergen Reactions  . Shellfish Allergy     Throat stated to close    Family History History reviewed. No pertinent family history. Social History  reports that he has never smoked. He has never used smokeless tobacco. He reports that he drinks alcohol. He reports that he does not use illicit drugs.  Review Of Systems:  Unable to provide history  Brief patient description:  61 y.o. WM with bowel perforation on vent postop.  Events Since  Admission:   Current Status: On vent post op. RASS -3. Not F/C  Vital Signs: Temp:  [97.8 F (36.6 C)-99.1 F (37.3 C)] 97.9 F (36.6 C) (06/04 1601) Pulse Rate:  [70-130] 116  (06/04 1800) Resp:  [12-43] 17  (06/04 1800) BP: (136-216)/(69-115) 137/74 mmHg (06/04 1800) SpO2:  [88 %-98 %] 95 % (06/04 1800) FiO2 (%):  [40 %-50 %] 40 % (06/04 1748) Weight:  [111.6 kg (246 lb 0.5 oz)] 111.6 kg (246 lb 0.5 oz) (06/04 1601)  Physical Examination: General:  Sedated, intubated Neuro:  No focal deficits HEENT:  WNL Neck:  No JVD Cardiovascular:  Tachhy, regular, no murmurs Lungs:  Clear anteriorly Abdomen:  Mildly distended, tympanitic, no BS, wound vac in place Ext: no edema  Principal Problem:  *Status post laparotomy Active Problems:  Small bowel perforation  Respiratory failure following trauma and surgery  Peritonitis  Severe sepsis  Sinus tachycardia   ASSESSMENT AND PLAN  PULMONARY  Lab 12/19/11 1640  PHART 7.207*  PCO2ART 46.7*  PO2ART 78.1*  HCO3 17.9*  O2SAT 93.5   Ventilator Settings: Vent Mode:  [-] PRVC FiO2 (%):  [40 %-50 %] 40 % Set Rate:  [12 bmp] 12 bmp Vt Set:  [600 mL] 600 mL PEEP:  [5 cmH20] 5 cmH20 Plateau Pressure:  [19 cmH20] 19 cmH20 CXR:  Pre op cxr NACPD ETT:  6/4>>>  A:  Post op resp  failure, vent dependence -  P:   Vent settings established DVT, SUP ordered Sedation/analgesia ordered Daily WUA/SBT - need to understand CCS plans prior to intubation. Any further surgeries planned?  CARDIOVASCULAR  Lab 12/19/11 0725  TROPONINI --  LATICACIDVEN 3.2*  PROBNP --   ECG:  PIV Lines:   A: Sinus tachycardia - reactive (SIRS, pain etc). Not sure if he was on beta blockers PTA P:  Control pain, fever Resuscitate with volume PRN metoprolol  RENAL  Lab 12/19/11 0725  NA 141  K 3.8  CL 104  CO2 24  BUN 13  CREATININE 1.00  CALCIUM 9.8  MG --  PHOS --   Intake/Output      06/03 0701 - 06/04 0700 06/04 0701 - 06/05 0700    I.V. (mL/kg)  8500 (76.2)   NG/GT  30   Total Intake(mL/kg)  8530 (76.4)   Urine (mL/kg/hr)  1525 (1.1)   Other  800   Blood  150   Total Output  2475   Net  +6055         Foley:  6/4 >>   A:  No active issues P:   IVFs ordered Monitor chemistries and renal function  GASTROINTESTINAL  Lab 12/19/11 0725  AST 76*  ALT 42  ALKPHOS 87  BILITOT 0.4  PROT 7.8  ALBUMIN 4.3    A:  Post laparotomy P:   Mgmt per CCS   HEMATOLOGIC  Lab 12/19/11 0725  HGB 18.1*  HCT 53.2*  PLT 261  INR --  APTT --   A:  Hemoconcentration P:  Volume resuscitate Monitor CBC  INFECTIOUS  Lab 12/19/11 0725  WBC 20.8*  PROCALCITON --   Cultures: None  Antibiotics: Ertapenem 6/4 >>   A:  Severe sepsis, Peritonitis due to bowel perf P:   Cont Envanz  ENDOCRINE No results found for this basename: GLUCAP:5 in the last 168 hours A:  No active issues P:   SSI if glu > 180   NEUROLOGIC  A:  No active issues P:   Sedation/analgesia as ordered  BEST PRACTICE / DISPOSITION Level of Care:  ICU Primary Service:  CCS Consultants:  PCCM Code Status:  Full Diet:  none DVT Px:  SCDs GI Px:  PPI Social / Family:     42 min CCM time  Merton Border, MD;  PCCM service; Mobile [phone]    12/19/2011, 7:00 PM

## 2011-12-19 NOTE — ED Notes (Signed)
TGR:MB01<OF> Expected date:12/19/11<BR> Expected time: 5:46 AM<BR> Means of arrival:Ambulance<BR> Comments:<BR> Abdominal pain

## 2011-12-19 NOTE — Anesthesia Preprocedure Evaluation (Addendum)
Anesthesia Evaluation  Patient identified by MRN, date of birth, ID band Patient awake    Reviewed: Allergy & Precautions, H&P , NPO status , Patient's Chart, lab work & pertinent test results  History of Anesthesia Complications Negative for: history of anesthetic complications  Airway Mallampati: III TM Distance: >3 FB Neck ROM: Full    Dental  (+) Teeth Intact, Poor Dentition and Dental Advisory Given   Pulmonary shortness of breath and at rest, asthma ,  breath sounds clear to auscultation  Pulmonary exam normal       Cardiovascular negative cardio ROS  Rhythm:Regular Rate:Tachycardia     Neuro/Psych ADHD  Neuromuscular disease    GI/Hepatic hiatal hernia, GERD-  Medicated,(+) Hepatitis -, Unspecified  Endo/Other  Morbid obesity  Renal/GU negative Renal ROS  negative genitourinary   Musculoskeletal negative musculoskeletal ROS (+)   Abdominal (+) + obese,   Peds  Hematology negative hematology ROS (+)   Anesthesia Other Findings   Reproductive/Obstetrics negative OB ROS                          Anesthesia Physical Anesthesia Plan  ASA: III and Emergent  Anesthesia Plan: General   Post-op Pain Management:    Induction: Intravenous, Rapid sequence and Cricoid pressure planned  Airway Management Planned: Oral ETT  Additional Equipment:   Intra-op Plan:   Post-operative Plan: Extubation in OR  Informed Consent: I have reviewed the patients History and Physical, chart, labs and discussed the procedure including the risks, benefits and alternatives for the proposed anesthesia with the patient or authorized representative who has indicated his/her understanding and acceptance.   Dental advisory given  Plan Discussed with: CRNA  Anesthesia Plan Comments:         Anesthesia Quick Evaluation

## 2011-12-19 NOTE — ED Notes (Signed)
Pt walked up to nurses station twice in the last 5 minutes begging for pain medicine. Pt advised to go back into his room and the MD would be notified. MD notified at this time. Pt still moaning from his room.

## 2011-12-19 NOTE — ED Provider Notes (Signed)
History     CSN: [id number]  Arrival date & time 12/19/11  0607   First MD Initiated Contact with Patient 12/19/11 [phone]      Chief Complaint  Patient presents with  . Abdominal Pain    (Consider location/radiation/quality/duration/timing/severity/associated sxs/prior treatment) HPI 61 year old male presents to emergency room with acute onset abdominal pain starting 3 AM waking him from sleep. Patient reports pain is in the area of his prior this incisional hernia and mesh. Patient reports pain is the worst he's ever experienced. Patient status post hemicolectomy for diverticulitis with complications involving ileostomy, and subsequent takedown and residual hernia. Patient's last bowel movement was last night. He denies previous history of small bowel obstruction. Patient's last surgery done in 2006, with Dr. Hassell Done. Patient denies previous history of gallstones. He believes he still has his gallbladder and his appendix. He has had no fevers, no sick contacts. Pain is worse in his right mid to lower abdomen over the area of the hernia. He has had nausea without vomiting. Patient has had diaphoresis due to pain  Past Medical History  Diagnosis Date  . Diverticulitis of colon   . Hepatitis 1975    unknown type   . Asthma   . GERD (gastroesophageal reflux disease)   . Hiatal hernia   . BPH (benign prostatic hypertrophy) with urinary obstruction   . Psoriasis     sees [name] at [facility].    Past Surgical History  Procedure Date  . Hemicolectomy     left side, at [facility]  . Ileostomy   . Colonoscopy   . Cystoscopy   . Bronchoscopy   . Tonsillectomy   . Vasectomy     No family history on file.  History  Substance Use Topics  . Smoking status: Never Smoker   . Smokeless tobacco: Never Used  . Alcohol Use: Yes     once a month or less      Review of Systems  All other systems reviewed and are negative.    Allergies  Shellfish allergy  Home Medications   No current outpatient prescriptions on file.  BP 190/104  Pulse 70  Resp 18  SpO2 97%  Physical Exam  Nursing note and vitals reviewed. Constitutional: He is oriented to person, place, and time. He appears well-developed. He appears distressed.  HENT:  Head: Normocephalic and atraumatic.  Eyes: Conjunctivae and EOM are normal. Pupils are equal, round, and reactive to light.  Neck: Normal range of motion. Neck supple. No JVD present. No tracheal deviation present. No thyromegaly present.  Cardiovascular: Normal rate, regular rhythm and normal heart sounds.  Exam reveals no friction rub.   No murmur heard. Pulmonary/Chest: Effort normal and breath sounds normal. No stridor. No respiratory distress. He has no wheezes. He has no rales. He exhibits no tenderness.  Abdominal: Soft. He exhibits distension and mass (hernia noted to right lower abdomen firm unable to reduce secondary to patient's significant pain). There is tenderness. There is no rebound and no guarding.       Significantly decreased bowel sounds  Musculoskeletal: Normal range of motion. He exhibits no edema and no tenderness.  Lymphadenopathy:    He has no cervical adenopathy.  Neurological: He is alert and oriented to person, place, and time.  Skin: No rash noted. No erythema. There is pallor.       Clammy diaphoretic  Psychiatric: He has a normal mood and affect. His behavior is normal. Judgment and thought content normal.  ED Course  Procedures (including critical care time)   Labs Reviewed  CBC  DIFFERENTIAL  COMPREHENSIVE METABOLIC PANEL  LIPASE, BLOOD  URINALYSIS, ROUTINE W REFLEX MICROSCOPIC   No results found.   No diagnosis found.    MDM  61 year old male with acute onset abdominal pain. Concern for small bowel obstruction given his presentation or less likely incarcerated hernia. Patient has had loss of his IV, nursing currently attempting second IV. Will treat pain get CT  abdomen        Kalman Drape, MD 12/19/11 [phone]

## 2011-12-19 NOTE — ED Notes (Signed)
Will take pt to OR once EKG complete

## 2011-12-19 NOTE — ED Notes (Signed)
Surgeon is at the bedside

## 2011-12-19 NOTE — H&P (Signed)
[name] is an 61 y.o. male.   Chief Complaint: Abdominal Pain, acute onset 3:30 AM with no resolution HPI: Is a 61 year old gentleman with a complicated abdominal history. In 2005 he underwent a colon resection for diverticulitis at [facility]. With multiple postop complications including peritonitis, open abdominal wound, ileostomy. In 2006 he had the ileostomy revised. Later in the year he underwent a ventral hernia repair by Dr. Hassell Done with anterior fascial repair using ultra Pro mesh. Since that time he has done fairly well. He has episodes where he says his mesh hurts. He has some bulging of his abdominal wall when this occurs. It normally goes away and a short time. He will 3:30 AM with acute abdominal pain, he treated with Tums. The pain did not improve and he had nausea which was not normal. Pain became progressively worse and he ultimately called the EMS where he was seen in the ER by Dr. Edison Pace. White count is 20,800, hemoglobin is 18, hematocrit is 53. CMP was normal. Acute chest and abdomen shows a mild ileus but no definite obstruction or free air probable hiatal hernia. Exam shows the patient to be extremely distended and tender. He is tachycardic, hypertensive, and respiratory rate is 30 at rest. He was seen by Dr. Excell Seltzer, contrast dye for CT has been started. We plan to get the CT now, and take the patient to the operating directly after that for an incarcerated ventral hernia.    Past Medical History  Diagnosis Date  . Diverticulitis of colon   . Hepatitis 1975    unknown type   . Asthma   . GERD (gastroesophageal reflux disease)   . Hiatal hernia   . BPH (benign prostatic hypertrophy) with urinary obstruction   . Psoriasis     sees [name] at [facility].    Past Surgical History  Procedure Date  . Hemicolectomy     left side, at [facility]  . Ileostomy   . Colonoscopy   . Cystoscopy   . Bronchoscopy   . Tonsillectomy   . Vasectomy   . Hernia repair    incisional hernia    History reviewed. No pertinent family history. Social History:  reports that he has never smoked. He has never used smokeless tobacco. He reports that he drinks alcohol. He reports that he does not use illicit drugs.  Allergies:  Allergies  Allergen Reactions  . Shellfish Allergy     Throat stated to close   Prior to Admission medications   none     (Not in a hospital admission)  Results for orders placed during the hospital encounter of 12/19/11 (from the past 48 hour(s))  CBC     Status: Abnormal   Collection Time   12/19/11  7:25 AM      Component Value Range Comment   WBC 20.8 (*) 4.0 - 10.5 (K/uL)    RBC 5.96 (*) 4.22 - 5.81 (MIL/uL)    Hemoglobin 18.1 (*) 13.0 - 17.0 (g/dL)    HCT 53.2 (*) 39.0 - 52.0 (%)    MCV 89.3  78.0 - 100.0 (fL)    MCH 30.4  26.0 - 34.0 (pg)    MCHC 34.0  30.0 - 36.0 (g/dL)    RDW 13.7  11.5 - 15.5 (%)    Platelets 261  150 - 400 (K/uL)   DIFFERENTIAL     Status: Abnormal   Collection Time   12/19/11  7:25 AM      Component  Value Range Comment   Neutrophils Relative 76  43 - 77 (%)    Neutro Abs 15.7 (*) 1.7 - 7.7 (K/uL)    Lymphocytes Relative 17  12 - 46 (%)    Lymphs Abs 3.6  0.7 - 4.0 (K/uL)    Monocytes Relative 7  3 - 12 (%)    Monocytes Absolute 1.4 (*) 0.1 - 1.0 (K/uL)    Eosinophils Relative 0  0 - 5 (%)    Eosinophils Absolute 0.1  0.0 - 0.7 (K/uL)    Basophils Relative 0  0 - 1 (%)    Basophils Absolute 0.1  0.0 - 0.1 (K/uL)   COMPREHENSIVE METABOLIC PANEL     Status: Abnormal   Collection Time   12/19/11  7:25 AM      Component Value Range Comment   Sodium 141  135 - 145 (mEq/L)    Potassium 3.8  3.5 - 5.1 (mEq/L)    Chloride 104  96 - 112 (mEq/L)    CO2 24  19 - 32 (mEq/L)    Glucose, Bld 150 (*) 70 - 99 (mg/dL)    BUN 13  6 - 23 (mg/dL)    Creatinine, Ser 1.00  0.50 - 1.35 (mg/dL)    Calcium 9.8  8.4 - 10.5 (mg/dL)    Total Protein 7.8  6.0 - 8.3 (g/dL)    Albumin 4.3  3.5 - 5.2 (g/dL)    AST 76  (*) 0 - 37 (U/L)    ALT 42  0 - 53 (U/L)    Alkaline Phosphatase 87  39 - 117 (U/L)    Total Bilirubin 0.4  0.3 - 1.2 (mg/dL)    GFR calc non Af Amer 81 (*) >90 (mL/min)    GFR calc Af Amer >90  >90 (mL/min)   LIPASE, BLOOD     Status: Normal   Collection Time   12/19/11  7:25 AM      Component Value Range Comment   Lipase 34  11 - 59 (U/L)   LACTIC ACID, PLASMA     Status: Abnormal   Collection Time   12/19/11  7:25 AM      Component Value Range Comment   Lactic Acid, Venous 3.2 (*) 0.5 - 2.2 (mmol/L)    Dg Abd Acute W/chest  12/19/2011  *RADIOLOGY REPORT*  Clinical Data: Umbilical pain, nausea, history of partial colectomy  ACUTE ABDOMEN SERIES (ABDOMEN 2 VIEW & CHEST 1 VIEW)  Comparison: Chest x-ray of 09/05/2008 and CT abdomen pelvis of 07/04/2005  Findings: The lungs are not optimally aerated but no focal infiltrate or effusion is seen.  There is an air-fluid level overlying the medial left lung base probably within a moderate sized hiatal hernia. Mild cardiomegaly is present.  Supine and erect views of the abdomen show a few scattered air- fluid levels throughout the small bowel without definite obstruction.  This may reflect mild ileus or local inflammatory process.  No free intraperitoneal air is seen.  No distention of the colon is noted.  Surgical clips are present in the left lower quadrant.  IMPRESSION:  1.  Possible mild ileus.  No definite obstruction or free air. 2.  Poor inspiration.  No active process. 3.  Probable hiatal hernia.  Original Report Authenticated By: Joretta Bachelor, M.D.    Review of Systems  Constitutional: Positive for chills. Negative for malaise/fatigue and diaphoresis. Weight loss: psoriasis.       Cold sweats  HENT: Negative.   Eyes:  Negative.   Respiratory: Negative.   Cardiovascular: Negative.   Gastrointestinal: Positive for heartburn, nausea, vomiting, abdominal pain and constipation. Negative for diarrhea, blood in stool and melena.  Genitourinary:  Negative.   Musculoskeletal: Negative.   Skin: Negative.        psorasis  Neurological: Negative.  Negative for weakness.  Endo/Heme/Allergies: Negative.   Psychiatric/Behavioral: Negative.     Blood pressure 201/109, pulse 114, temperature 97.8 F (36.6 C), temperature source Oral, resp. rate 22, SpO2 95.00%. Physical Exam  Constitutional: He appears well-developed and well-nourished. He appears distressed.       Tachycardic, tachypenic, hypertensive  HENT:  Head: Normocephalic and atraumatic.  Nose: Nose normal.  Eyes: Conjunctivae and EOM are normal. Pupils are equal, round, and reactive to light.  Neck: Normal range of motion. Neck supple. No JVD present. No tracheal deviation present. No thyromegaly present.  Cardiovascular: Normal rate, regular rhythm, normal heart sounds and intact distal pulses.  Exam reveals no gallop.   No murmur heard. Respiratory: Breath sounds normal. No respiratory distress. He has no wheezes. He has no rales. He exhibits no tenderness.  GI: He exhibits distension and mass (Hernias distended both sides of the midline scar.). There is tenderness. There is rebound and guarding.       Abdomen is distended, midline scar with hernia Right and left of the midline.  The right one has a blue tinge to color of the skin here. No bowel sounds.  Musculoskeletal: He exhibits no edema and no tenderness.  Lymphadenopathy:    He has no cervical adenopathy.  Neurological: He is alert. He has normal reflexes. No cranial nerve deficit.  Skin: Skin is warm and dry. No rash noted. No erythema. No pallor.  Psychiatric: He has a normal mood and affect. His behavior is normal. Judgment and thought content normal.     Assessment/Plan 1. Incarcerated ventral hernia with acute abdomen. 2. History of partial colectomy, ileostomy, peritonitis with open wound 2005 at [facility]. 3. Ileostomy reversal 2006 4. Ventral hernia repair with the ultra Pro mesh 02/10/2005 Dr. Johnathan Hausen 5.  Hiatal hernia 6. Dyslipidemia 7. History of asthma 8. Psoriasis 9. History of atypical chest pain with normal stress test 2008  Plan: Review the CT and proceed with surgery as soon as possible. Start IV fluids and antibiotics. Dr. Excell Seltzer has seen and examined the patient discussed the risks and benefits with him. [facility] physician assistant for Dr. Excell Seltzer.  [name] 12/19/2011, 10:07 AM

## 2011-12-19 NOTE — ED Notes (Addendum)
Will place pt on 2l [address] due to spo2 reading on RA

## 2011-12-19 NOTE — Progress Notes (Signed)
[facility] Progress Note Patient Name: [name] DOB: 07-24-1950 MRN: [id number]  Date of Service  12/19/2011   HPI/Events of Note   Called by surgery for postop vent management  eICU Interventions  See orders and full pccm note to follow    Intervention Category Major Interventions: Respiratory failure - evaluation and management  Asencion Noble 12/19/2011, 4:10 PM

## 2011-12-20 ENCOUNTER — Inpatient Hospital Stay (HOSPITAL_COMMUNITY): Payer: 59

## 2011-12-20 ENCOUNTER — Encounter (HOSPITAL_COMMUNITY): Payer: Self-pay

## 2011-12-20 LAB — BASIC METABOLIC PANEL
BUN: 18 mg/dL (ref 6–23)
BUN: 22 mg/dL (ref 6–23)
CO2: 24 mEq/L (ref 19–32)
Calcium: 7.4 mg/dL — ABNORMAL LOW (ref 8.4–10.5)
Calcium: 7.4 mg/dL — ABNORMAL LOW (ref 8.4–10.5)
Chloride: 106 mEq/L (ref 96–112)
Creatinine, Ser: 1.7 mg/dL — ABNORMAL HIGH (ref 0.50–1.35)
Creatinine, Ser: 1.58 mg/dL — ABNORMAL HIGH (ref 0.50–1.35)
GFR calc Af Amer: 49 mL/min — ABNORMAL LOW (ref >90)
GFR calc Af Amer: 54 mL/min — ABNORMAL LOW (ref >90)

## 2011-12-20 LAB — BLOOD GAS, ARTERIAL
Acid-base deficit: 2.7 mmol/L — ABNORMAL HIGH (ref 0.0–2.0)
Drawn by: 129801
PEEP: 5 cmH2O
TCO2: 21.1 mmol/L (ref 0–100)
pCO2 arterial: 48.3 mmHg — ABNORMAL HIGH (ref 35.0–45.0)
pH, Arterial: 7.309 — ABNORMAL LOW (ref 7.350–7.450)
pO2, Arterial: 71.1 mmHg — ABNORMAL LOW (ref 80.0–100.0)

## 2011-12-20 LAB — GLUCOSE, CAPILLARY: Glucose-Capillary: 148 mg/dL — ABNORMAL HIGH (ref 70–99)

## 2011-12-20 LAB — CBC
HCT: 45.3 % (ref 39.0–52.0)
MCHC: 32.5 g/dL (ref 30.0–36.0)
MCV: 93.4 fL (ref 78.0–100.0)
RDW: 14.5 % (ref 11.5–15.5)

## 2011-12-20 LAB — LACTIC ACID, PLASMA: Lactic Acid, Venous: 2.1 mmol/L (ref 0.5–2.2)

## 2011-12-20 MED ORDER — SODIUM CHLORIDE 0.9 % IV SOLN
INTRAVENOUS | Status: DC
  Administered 2011-12-20 – 2011-12-22 (×3): via INTRAVENOUS

## 2011-12-20 MED ORDER — HYDROMORPHONE HCL PF 1 MG/ML IJ SOLN
1.0000 mg | INTRAMUSCULAR | Status: DC | PRN
  Administered 2011-12-20 – 2011-12-23 (×8): 1 mg via INTRAVENOUS
  Filled 2011-12-20 (×9): qty 1

## 2011-12-20 MED ORDER — BIOTENE DRY MOUTH MT LIQD
15.0000 mL | Freq: Four times a day (QID) | OROMUCOSAL | Status: DC
  Administered 2011-12-20 – 2012-01-02 (×42): 15 mL via OROMUCOSAL

## 2011-12-20 MED ORDER — ACETAMINOPHEN 650 MG RE SUPP
650.0000 mg | Freq: Four times a day (QID) | RECTAL | Status: DC | PRN
  Administered 2011-12-21: 650 mg via RECTAL
  Filled 2011-12-20: qty 1

## 2011-12-20 MED ORDER — CHLORHEXIDINE GLUCONATE 0.12 % MT SOLN
15.0000 mL | Freq: Two times a day (BID) | OROMUCOSAL | Status: DC
  Administered 2011-12-20 – 2012-01-02 (×25): 15 mL via OROMUCOSAL
  Filled 2011-12-20 (×27): qty 15

## 2011-12-20 MED ORDER — MIDAZOLAM HCL 5 MG/ML IJ SOLN
INTRAMUSCULAR | Status: AC
  Administered 2011-12-20: 4 mg
  Filled 2011-12-20: qty 1

## 2011-12-20 MED ORDER — HEPARIN SODIUM (PORCINE) 5000 UNIT/ML IJ SOLN
5000.0000 [IU] | Freq: Three times a day (TID) | INTRAMUSCULAR | Status: DC
  Administered 2011-12-20 – 2012-01-02 (×38): 5000 [IU] via SUBCUTANEOUS
  Filled 2011-12-20 (×42): qty 1

## 2011-12-20 NOTE — Progress Notes (Signed)
Utilization review completed.  

## 2011-12-20 NOTE — Significant Event (Signed)
Pt continues to have pain in spite of being on 300 mcg/hr fentanyl infusion.  Will add dilaudid 1 mg prn q4h and monitor.  Chesley Mires, MD [facility] Pulmonary/Critical Care 12/20/2011, 4:03 PM Pager:  [phone] After 3pm call: [phone]

## 2011-12-20 NOTE — Progress Notes (Addendum)
Name: [name] MRN: [id number] DOB: [birth year]    LOS: 1  Referring Provider:  Olen Pel Reason for Referral:  Postop vent management  PULMONARY / CRITICAL CARE MEDICINE  Pt Profile: Admitted to ICU post laparotomy for bowel perforation and small bowel resection. PCCM asked to assist in post op vent and CCM issues  HPI:  61 year old gentleman with a complicated abdominal history. In 2005 he underwent a colon resection for diverticulitis at [facility]. With multiple postop complications including peritonitis, open abdominal wound, ileostomy. In 2006 he had the ileostomy revised. Later in the year he underwent a ventral hernia repair by Dr. Hassell Done with anterior fascial repair using ultra Pro mesh Pt awoke early AM 12/19/11 with acute abdominal pain. Found to have small bowel perforation at time of ex lap 6/4.  CCM consulted postop.   Brief patient description:  61 yo on vent but awake. Events Since Admission: .   Current Status: remains on vent  Vital Signs: Temp:  [97.9 F (36.6 C)-101.5 F (38.6 C)] 100.5 F (38.1 C) (06/05 0800) Pulse Rate:  [97-130] 116  (06/05 0800) Resp:  [10-43] 12  (06/05 0800) BP: (103-216)/(55-115) 116/60 mmHg (06/05 0800) SpO2:  [92 %-98 %] 92 % (06/05 0800) FiO2 (%):  [40 %-50 %] 40 % (06/05 0908) Weight:  [246 lb 0.5 oz (111.6 kg)] 246 lb 0.5 oz (111.6 kg) (06/04 1601)  Physical Examination: General:  , intubated, follows commands Neuro:  No focal deficits HEENT:  WNL Neck:  No JVD Cardiovascular:   regular, no murmurs Lungs:  Clear anteriorly Abdomen:  Mildly distended, tympanitic, no BS, wound vac in place Ext: no edema  Principal Problem:  *Status post laparotomy Active Problems:  Small bowel perforation  Respiratory failure following trauma and surgery  Sinus tachycardia  Peritonitis  Severe sepsis   ASSESSMENT AND PLAN  PULMONARY  Lab 12/19/11 2225 12/19/11 1640  PHART -- 7.207*  PCO2ART -- 46.7*  PO2ART -- 78.1*    HCO3 -- 17.9*  O2SAT 73.1 93.5   Ventilator Settings: Vent Mode:  [-] PRVC FiO2 (%):  [40 %-50 %] 40 % Set Rate:  [12 bmp] 12 bmp Vt Set:  [600 mL] 600 mL PEEP:  [5 cmH20] 5 cmH20 Plateau Pressure:  [16 cmH20-21 cmH20] 18 cmH20 CXR:   Ct Abdomen Pelvis W Contrast  12/19/2011  *RADIOLOGY REPORT*  Clinical Data: Abdominal pain, nausea/vomiting, known incisional hernia, history of diverticulitis with colectomy and ileostomy status post reversal  CT ABDOMEN AND PELVIS WITH CONTRAST  Technique:  Multidetector CT imaging of the abdomen and pelvis was performed following the standard protocol during bolus administration of intravenous contrast.  Contrast: 138m OMNIPAQUE IOHEXOL 300 MG/ML  SOLN  Comparison: [facility] CT abdomen pelvis dated 07/04/2005  Findings: Dependent atelectasis in the bilateral lower lobes.  Mild nodularity at the right lung base.  Liver, spleen, pancreas, and adrenal glands are within normal limits. Two splenules in the left upper abdomen.  Gallbladder is unremarkable.  No intrahepatic or extrahepatic ductal dilatation.  Kidneys are within normal limits.  No hydronephrosis.  Moderate right ventral hernia containing multiple loops of small bowel (series 2/image 64).  The medial wall of one of these loops of small bowel appears discontinuous (series 2/image 62) with associated 6.3 x 3.4 cm gas and fluid collection/abscess within the abdominal mesentery and extending into the hernia (series 2/image 61), reflecting localized perforation.  Surrounding inflammatory stranding and a small amount of dependent fluid.  Additional small right paramidline  ventral hernia (series 2/image 50) with surrounding inflammatory changes and a small amount of fluid.  At least three left paramidline ventral hernias without evidence of complication (series 2/images 59, 64, and 74).  Prior small bowel/colonic resections with surgical anastomosis in the right mid abdomen (series 2/image 74) and right  pelvis (series 2/image 87).  No evidence of abdominal aortic aneurysm.  No abdominopelvic ascites.  No suspicious abdominopelvic lymphadenopathy.  Prostatomegaly, measuring 5.0 cm in transverse dimension.  Bladder is within normal limits.  Mild degenerative changes of the lower thoracic spine.  IMPRESSION: Suspected localized perforation of a loop of small bowel within a complicated right ventral hernia.  Associated 6.3 x 3.4 cm gas and fluid collection/abscess, as described above.  Surrounding inflammatory changes with fluid.  Additional small right paramidline ventral hernia with surrounding inflammatory changes and a small amount of fluid.  At least three additional left paramidline ventral hernias without evidence of complication.  These results were called by telephone on 12/19/2011  at  1055 hrs to  Dr Edison Pace, who verbally acknowledged these results.  Original Report Authenticated By: Julian Hy, M.D.   Dg Chest Port 1 View  12/20/2011  *RADIOLOGY REPORT*  Clinical Data: Respiratory failure.  PORTABLE CHEST - 1 VIEW  Comparison: 12/19/2011.  Findings: The endotracheal tube is 4 cm above the carina.  The right IJ catheter and NG tube are stable.  Persistent low lung volumes with vascular crowding and bibasilar atelectasis.  No definite pleural effusions or pneumothorax.  IMPRESSION:  1.  Stable support apparatus. 2.  Persistent low lung volumes with vascular crowding and bibasilar atelectasis.  Original Report Authenticated By: P. Kalman Jewels, M.D.   Dg Chest Port 1 View  12/19/2011  *RADIOLOGY REPORT*  Clinical Data: Central line placement.  PORTABLE CHEST - 1 VIEW  Comparison: Chest x-ray 09/05/2008.  Findings: There is a left-sided internal jugular central venous catheter with tip terminating in the proximal superior vena cava. An endotracheal tube is in place with tip 4.6 cm above the carina.A nasogastric tube is seen extending into the stomach, however, the tip of the nasogastric tube extends  below the lower margin of the image.  Lung volumes are low.  There are bibasilar opacities (right greater than left), which may represent areas of atelectasis and/or consolidation.  Linear opacities are also noted in the mid lung bilaterally, most can consistent with areas of atelectasis.  No definite pleural effusions.  The patient is rotated to the right on today's exam, resulting in distortion of the mediastinal contours and reduced diagnostic sensitivity and specificity for mediastinal pathology.  This rotation makes heart size difficult to assess as well.  IMPRESSION: 1.  Support apparatus, as above. 2.  Low lung volumes with bibasilar atelectasis and/or consolidation.  There is also extensive atelectasis in the mid lungs bilaterally.  Original Report Authenticated By: Etheleen Mayhew, M.D.   Dg Abd Acute W/chest  12/19/2011  *RADIOLOGY REPORT*  Clinical Data: Umbilical pain, nausea, history of partial colectomy  ACUTE ABDOMEN SERIES (ABDOMEN 2 VIEW & CHEST 1 VIEW)  Comparison: Chest x-ray of 09/05/2008 and CT abdomen pelvis of 07/04/2005  Findings: The lungs are not optimally aerated but no focal infiltrate or effusion is seen.  There is an air-fluid level overlying the medial left lung base probably within a moderate sized hiatal hernia. Mild cardiomegaly is present.  Supine and erect views of the abdomen show a few scattered air- fluid levels throughout the small bowel without definite obstruction.  This  may reflect mild ileus or local inflammatory process.  No free intraperitoneal air is seen.  No distention of the colon is noted.  Surgical clips are present in the left lower quadrant.  IMPRESSION:  1.  Possible mild ileus.  No definite obstruction or free air. 2.  Poor inspiration.  No active process. 3.  Probable hiatal hernia.  Original Report Authenticated By: Joretta Bachelor, M.D.    ETT:  6/4>>>  A:  Post op resp failure, vent dependence -  P:   Vent settings established. DVT, SUP  ordered. Sedation/analgesia ordered. Daily WUA/SBT - need to understand CCS plans prior to intubation. No resistance from CCS to extubation.  CARDIOVASCULAR  Lab 12/19/11 1839 12/19/11 0725  TROPONINI -- --  LATICACIDVEN 5.4* 3.2*  PROBNP -- --   ECG:   Lines: PIV  A: Sinus tachycardia - reactive (SIRS, pain etc). Not sure if he was on beta blockers PTA P:  Control pain, fever Resuscitate with volume PRN metoprolol  RENAL  Lab 12/20/11 0510 12/19/11 0725  NA 138 141  K 5.6* 3.8  CL 106 104  CO2 24 24  BUN 18 13  CREATININE 1.70* 1.00  CALCIUM 7.4* 9.8  MG -- --  PHOS -- --   Intake/Output      06/04 0701 - 06/05 0700 06/05 0701 - 06/06 0700   I.V. (mL/kg) 10355 (92.8) 375 (3.4)   NG/GT 60    Total Intake(mL/kg) 10415 (93.3) 375 (3.4)   Urine (mL/kg/hr) 2610 (1) 85   Emesis/NG output 100    Drains 150    Other 800    Blood 150    Total Output 3810 85   Net +6605 +290         Foley:  6/4 >>   A:  Mild renal insuff P:   IVFs ordered Monitor chemistries and renal function  GASTROINTESTINAL  Lab 12/19/11 0725  AST 76*  ALT 42  ALKPHOS 87  BILITOT 0.4  PROT 7.8  ALBUMIN 4.3    A:  Post laparotomy P:   Mgmt per CCS.   HEMATOLOGIC  Lab 12/20/11 0510 12/19/11 2241 12/19/11 2035 12/19/11 0725  HGB 14.7 15.7 -- 18.1*  HCT 45.3 48.2 -- 53.2*  PLT 235 -- -- 261  INR -- -- 1.13 --  APTT -- -- -- --   A:  Hemoconcentration P:  Volume resuscitate. Monitor CBC.  INFECTIOUS  Lab 12/20/11 0510 12/19/11 1838 12/19/11 0725  WBC 19.1* -- 20.8*  PROCALCITON -- 3.63 --   Cultures: None  Antibiotics: Ertapenem 6/4 >>   A:  Severe sepsis, Peritonitis due to bowel perf P:   Cont Envanz.  ENDOCRINE  Lab 12/19/11 2302  GLUCAP 148*   A:  No active issues P:   SSI if glu > 180.  NEUROLOGIC  A:  No active issues P:   Sedation/analgesia as ordered.  BEST PRACTICE / DISPOSITION Level of Care:  ICU Primary Service:  CCS Consultants:   PCCM Code Status:  Full Diet:  none DVT Px:  SCDs GI Px:  PPI Social / Family:     Richardson Landry Minor ACNP Maryanna Shape PCCM Pager [phone] till 3 pm If no answer page [phone] 12/20/2011, 9:47 AM  Will keep on wean today with SBT in AM.  Holding off extubation today due to acid base imbalance.  CC time 35 min.  Patient seen and examined, agree with above note.  I dictated the care and orders written for this patient  under my direction.  Jennet Maduro, M.D. [phone]

## 2011-12-20 NOTE — Progress Notes (Signed)
Patient ID: [name], male   DOB: 04/14/1951, 61 y.o.   MRN: [id number] 1 Day Post-Op  Subjective: Sedated but responsive on vent.  Some abdominal pain  Objective: Vital signs in last 24 hours: Temp:  [97.9 F (36.6 C)-101.5 F (38.6 C)] 100.5 F (38.1 C) (06/05 0800) Pulse Rate:  [97-130] 117  (06/05 0600) Resp:  [10-43] 10  (06/05 0600) BP: (103-216)/(55-115) 106/67 mmHg (06/05 0600) SpO2:  [92 %-98 %] 92 % (06/05 0600) FiO2 (%):  [40 %-50 %] 40 % (06/05 0332) Weight:  [246 lb 0.5 oz (111.6 kg)] 246 lb 0.5 oz (111.6 kg) (06/04 1601)    Intake/Output from previous day: 06/04 0701 - 06/05 0700 In: 10290 [I.V.:10230; NG/GT:60] Out: 3810 [Urine:2610; Emesis/NG output:100; Drains:150; Blood:150] Intake/Output this shift: Total I/O In: -  Out: 85 [Urine:85]  General appearance: alert and no distress Resp: clear to auscultation bilaterally GI: abnormal findings:  moderate tenderness in the entire abdomen Incision/Wound: VAC in place, dry and intact  Lab Results:   Basename 12/20/11 0510 12/19/11 2241 12/19/11 0725  WBC 19.1* -- 20.8*  HGB 14.7 15.7 --  HCT 45.3 48.2 --  PLT 235 -- 261   BMET  Basename 12/20/11 0510 12/19/11 0725  NA 138 141  K 5.6* 3.8  CL 106 104  CO2 24 24  GLUCOSE 141* 150*  BUN 18 13  CREATININE 1.70* 1.00  CALCIUM 7.4* 9.8     Studies/Results: Ct Abdomen Pelvis W Contrast  12/19/2011  *RADIOLOGY REPORT*  Clinical Data: Abdominal pain, nausea/vomiting, known incisional hernia, history of diverticulitis with colectomy and ileostomy status post reversal  CT ABDOMEN AND PELVIS WITH CONTRAST  Technique:  Multidetector CT imaging of the abdomen and pelvis was performed following the standard protocol during bolus administration of intravenous contrast.  Contrast: 162m OMNIPAQUE IOHEXOL 300 MG/ML  SOLN  Comparison: [facility] CT abdomen pelvis dated 07/04/2005  Findings: Dependent atelectasis in the bilateral lower lobes.  Mild  nodularity at the right lung base.  Liver, spleen, pancreas, and adrenal glands are within normal limits. Two splenules in the left upper abdomen.  Gallbladder is unremarkable.  No intrahepatic or extrahepatic ductal dilatation.  Kidneys are within normal limits.  No hydronephrosis.  Moderate right ventral hernia containing multiple loops of small bowel (series 2/image 64).  The medial wall of one of these loops of small bowel appears discontinuous (series 2/image 62) with associated 6.3 x 3.4 cm gas and fluid collection/abscess within the abdominal mesentery and extending into the hernia (series 2/image 61), reflecting localized perforation.  Surrounding inflammatory stranding and a small amount of dependent fluid.  Additional small right paramidline ventral hernia (series 2/image 50) with surrounding inflammatory changes and a small amount of fluid.  At least three left paramidline ventral hernias without evidence of complication (series 2/images 59, 64, and 74).  Prior small bowel/colonic resections with surgical anastomosis in the right mid abdomen (series 2/image 74) and right pelvis (series 2/image 87).  No evidence of abdominal aortic aneurysm.  No abdominopelvic ascites.  No suspicious abdominopelvic lymphadenopathy.  Prostatomegaly, measuring 5.0 cm in transverse dimension.  Bladder is within normal limits.  Mild degenerative changes of the lower thoracic spine.  IMPRESSION: Suspected localized perforation of a loop of small bowel within a complicated right ventral hernia.  Associated 6.3 x 3.4 cm gas and fluid collection/abscess, as described above.  Surrounding inflammatory changes with fluid.  Additional small right paramidline ventral hernia with surrounding inflammatory changes and a small amount  of fluid.  At least three additional left paramidline ventral hernias without evidence of complication.  These results were called by telephone on 12/19/2011  at  1055 hrs to  Dr Edison Pace, who verbally  acknowledged these results.  Original Report Authenticated By: Julian Hy, M.D.   Dg Chest Port 1 View  12/20/2011  *RADIOLOGY REPORT*  Clinical Data: Respiratory failure.  PORTABLE CHEST - 1 VIEW  Comparison: 12/19/2011.  Findings: The endotracheal tube is 4 cm above the carina.  The right IJ catheter and NG tube are stable.  Persistent low lung volumes with vascular crowding and bibasilar atelectasis.  No definite pleural effusions or pneumothorax.  IMPRESSION:  1.  Stable support apparatus. 2.  Persistent low lung volumes with vascular crowding and bibasilar atelectasis.  Original Report Authenticated By: P. Kalman Jewels, M.D.   Dg Chest Port 1 View  12/19/2011  *RADIOLOGY REPORT*  Clinical Data: Central line placement.  PORTABLE CHEST - 1 VIEW  Comparison: Chest x-ray 09/05/2008.  Findings: There is a left-sided internal jugular central venous catheter with tip terminating in the proximal superior vena cava. An endotracheal tube is in place with tip 4.6 cm above the carina.A nasogastric tube is seen extending into the stomach, however, the tip of the nasogastric tube extends below the lower margin of the image.  Lung volumes are low.  There are bibasilar opacities (right greater than left), which may represent areas of atelectasis and/or consolidation.  Linear opacities are also noted in the mid lung bilaterally, most can consistent with areas of atelectasis.  No definite pleural effusions.  The patient is rotated to the right on today's exam, resulting in distortion of the mediastinal contours and reduced diagnostic sensitivity and specificity for mediastinal pathology.  This rotation makes heart size difficult to assess as well.  IMPRESSION: 1.  Support apparatus, as above. 2.  Low lung volumes with bibasilar atelectasis and/or consolidation.  There is also extensive atelectasis in the mid lungs bilaterally.  Original Report Authenticated By: Etheleen Mayhew, M.D.   Dg Abd Acute  W/chest  12/19/2011  *RADIOLOGY REPORT*  Clinical Data: Umbilical pain, nausea, history of partial colectomy  ACUTE ABDOMEN SERIES (ABDOMEN 2 VIEW & CHEST 1 VIEW)  Comparison: Chest x-ray of 09/05/2008 and CT abdomen pelvis of 07/04/2005  Findings: The lungs are not optimally aerated but no focal infiltrate or effusion is seen.  There is an air-fluid level overlying the medial left lung base probably within a moderate sized hiatal hernia. Mild cardiomegaly is present.  Supine and erect views of the abdomen show a few scattered air- fluid levels throughout the small bowel without definite obstruction.  This may reflect mild ileus or local inflammatory process.  No free intraperitoneal air is seen.  No distention of the colon is noted.  Surgical clips are present in the left lower quadrant.  IMPRESSION:  1.  Possible mild ileus.  No definite obstruction or free air. 2.  Poor inspiration.  No active process. 3.  Probable hiatal hernia.  Original Report Authenticated By: Joretta Bachelor, M.D.    Anti-infectives: Anti-infectives     Start     Dose/Rate Route Frequency Ordered Stop   12/19/11 1100   ertapenem (INVANZ) 1 g in sodium chloride 0.9 % 50 mL IVPB        1 g 100 mL/hr over 30 Minutes Intravenous Every 24 hours 12/19/11 1039            Assessment/Plan: s/p Procedure(s): EXPLORATORY LAPAROTOMY SMALL BOWEL  RESECTION Reasonably stable post op. Early sepsis. Vent management per CCM On Invanz Hyperkalemia-D/C KCL from IVF and recheck Elevated creatitine-UOP ggod   LOS: 1 day    Jaydien Panepinto T 12/20/2011

## 2011-12-21 ENCOUNTER — Inpatient Hospital Stay (HOSPITAL_COMMUNITY): Payer: 59

## 2011-12-21 LAB — CBC
MCV: 95.7 fL (ref 78.0–100.0)
Platelets: 168 10*3/uL (ref 150–400)
RBC: 4.19 MIL/uL — ABNORMAL LOW (ref 4.22–5.81)
RDW: 14.6 % (ref 11.5–15.5)
WBC: 17.1 10*3/uL — ABNORMAL HIGH (ref 4.0–10.5)

## 2011-12-21 LAB — BLOOD GAS, ARTERIAL
Bicarbonate: 26.9 mEq/L — ABNORMAL HIGH (ref 20.0–24.0)
Bicarbonate: 25.1 mEq/L — ABNORMAL HIGH (ref 20.0–24.0)
PEEP: 5 cmH2O
PEEP: 5 cmH2O
Patient temperature: 101.5
TCO2: 25 mmol/L (ref 0–100)
TCO2: 22.5 mmol/L (ref 0–100)
pCO2 arterial: 68.1 mmHg (ref 35.0–45.0)
pH, Arterial: 7.232 — ABNORMAL LOW (ref 7.350–7.450)
pH, Arterial: 7.46 — ABNORMAL HIGH (ref 7.350–7.450)
pO2, Arterial: 86.5 mmHg (ref 80.0–100.0)

## 2011-12-21 LAB — CARDIAC PANEL(CRET KIN+CKTOT+MB+TROPI)
CK, MB: 1.8 ng/mL (ref 0.3–4.0)
Relative Index: 0.6 (ref 0.0–2.5)
Relative Index: 0.7 (ref 0.0–2.5)
Relative Index: 0.8 (ref 0.0–2.5)
Total CK: 354 U/L — ABNORMAL HIGH (ref 7–232)
Troponin I: <0.3 ng/mL (ref <0.30)
Troponin I: <0.3 ng/mL (ref <0.30)
Troponin I: <0.3 ng/mL (ref <0.30)

## 2011-12-21 LAB — BASIC METABOLIC PANEL
CO2: 28 mEq/L (ref 19–32)
Chloride: 108 mEq/L (ref 96–112)
Creatinine, Ser: 1.33 mg/dL (ref 0.50–1.35)
GFR calc Af Amer: 67 mL/min — ABNORMAL LOW (ref >90)
Potassium: 4.4 mEq/L (ref 3.5–5.1)

## 2011-12-21 LAB — PROCALCITONIN: Procalcitonin: 9.15 ng/mL

## 2011-12-21 LAB — MAGNESIUM: Magnesium: 1.8 mg/dL (ref 1.5–2.5)

## 2011-12-21 LAB — PHOSPHORUS: Phosphorus: 2.1 mg/dL — ABNORMAL LOW (ref 2.3–4.6)

## 2011-12-21 MED ORDER — ADENOSINE 6 MG/2ML IV SOLN: 6.0000 mg | Freq: Once | INTRAVENOUS | Status: DC

## 2011-12-21 MED ORDER — SODIUM PHOSPHATE 3 MMOLE/ML IV SOLN
20.0000 mmol | Freq: Once | INTRAVENOUS | Status: AC
  Administered 2011-12-21: 20 mmol via INTRAVENOUS
  Filled 2011-12-21: qty 6.67

## 2011-12-21 MED ORDER — ADENOSINE 6 MG/2ML IV SOLN
INTRAVENOUS | Status: AC
  Administered 2011-12-21: 6 mg
  Filled 2011-12-21: qty 6

## 2011-12-21 MED ORDER — DILTIAZEM HCL 100 MG IV SOLR
5.0000 mg/h | INTRAVENOUS | Status: DC
  Administered 2011-12-21: 10 mg/h via INTRAVENOUS
  Administered 2011-12-21: 15 mg/h via INTRAVENOUS
  Administered 2011-12-21: 10 mg/h via INTRAVENOUS

## 2011-12-21 MED ORDER — MIDAZOLAM HCL 5 MG/ML IJ SOLN
INTRAMUSCULAR | Status: AC
  Administered 2011-12-21: 4 mg
  Filled 2011-12-21: qty 1

## 2011-12-21 MED ORDER — MIDAZOLAM HCL 5 MG/ML IJ SOLN
INTRAMUSCULAR | Status: AC
  Administered 2011-12-21: 5 mg
  Filled 2011-12-21: qty 1

## 2011-12-21 MED ORDER — DILTIAZEM LOAD VIA INFUSION
10.0000 mg | Freq: Once | INTRAVENOUS | Status: DC
  Filled 2011-12-21: qty 10

## 2011-12-21 NOTE — Progress Notes (Signed)
Patient ID: [name], male   DOB: 1950-08-09, 61 y.o.   MRN: [id number] 2 Days Post-Op  Subjective: Sedated but responsive on vent.  Had decreased O2 sats earlier this AM, now on 100% O2, sat 92%.  Repeat CXR pending. Had episode of SVT now resolved  Objective: Vital signs in last 24 hours: Temp:  [98.8 F (37.1 C)-101.6 F (38.7 C)] 101 F (38.3 C) (06/06 0400) Pulse Rate:  [97-126] 107  (06/06 0700) Resp:  [11-16] 12  (06/06 0700) BP: (106-149)/(52-76) 141/58 mmHg (06/06 0700) SpO2:  [91 %-96 %] 92 % (06/06 0700) FiO2 (%):  [40 %-100 %] 100 % (06/06 0700) Weight:  [252 lb 6.8 oz (114.5 kg)] 252 lb 6.8 oz (114.5 kg) (06/06 0000)    Intake/Output from previous day: 06/05 0701 - 06/06 0700 In: 3442.8 [I.V.:3299.8; IV Piggyback:143] Out: 2030 [Urine:1930; Emesis/NG output:100] Intake/Output this shift:    General appearance: Sedated but responds, no distress Resp: rhonchi bilaterally GI: abnormal findings:  mild tenderness in the entire abdomen Incision/Wound: VAC in place, clean and dry  Lab Results:   Basename 12/21/11 0400 12/20/11 0510  WBC 17.1* 19.1*  HGB 12.4* 14.7  HCT 40.1 45.3  PLT 168 235   BMET  Basename 12/21/11 0400 12/20/11 1400  NA 142 140  K 4.4 4.8  CL 108 108  CO2 28 25  GLUCOSE 130* 129*  BUN 20 22  CREATININE 1.33 1.58*  CALCIUM 7.6* 7.4*     Studies/Results: Ct Abdomen Pelvis W Contrast  12/19/2011  *RADIOLOGY REPORT*  Clinical Data: Abdominal pain, nausea/vomiting, known incisional hernia, history of diverticulitis with colectomy and ileostomy status post reversal  CT ABDOMEN AND PELVIS WITH CONTRAST  Technique:  Multidetector CT imaging of the abdomen and pelvis was performed following the standard protocol during bolus administration of intravenous contrast.  Contrast: 169m OMNIPAQUE IOHEXOL 300 MG/ML  SOLN  Comparison: [facility] CT abdomen pelvis dated 07/04/2005  Findings: Dependent atelectasis in the bilateral lower lobes.   Mild nodularity at the right lung base.  Liver, spleen, pancreas, and adrenal glands are within normal limits. Two splenules in the left upper abdomen.  Gallbladder is unremarkable.  No intrahepatic or extrahepatic ductal dilatation.  Kidneys are within normal limits.  No hydronephrosis.  Moderate right ventral hernia containing multiple loops of small bowel (series 2/image 64).  The medial wall of one of these loops of small bowel appears discontinuous (series 2/image 62) with associated 6.3 x 3.4 cm gas and fluid collection/abscess within the abdominal mesentery and extending into the hernia (series 2/image 61), reflecting localized perforation.  Surrounding inflammatory stranding and a small amount of dependent fluid.  Additional small right paramidline ventral hernia (series 2/image 50) with surrounding inflammatory changes and a small amount of fluid.  At least three left paramidline ventral hernias without evidence of complication (series 2/images 59, 64, and 74).  Prior small bowel/colonic resections with surgical anastomosis in the right mid abdomen (series 2/image 74) and right pelvis (series 2/image 87).  No evidence of abdominal aortic aneurysm.  No abdominopelvic ascites.  No suspicious abdominopelvic lymphadenopathy.  Prostatomegaly, measuring 5.0 cm in transverse dimension.  Bladder is within normal limits.  Mild degenerative changes of the lower thoracic spine.  IMPRESSION: Suspected localized perforation of a loop of small bowel within a complicated right ventral hernia.  Associated 6.3 x 3.4 cm gas and fluid collection/abscess, as described above.  Surrounding inflammatory changes with fluid.  Additional small right paramidline ventral hernia with surrounding  inflammatory changes and a small amount of fluid.  At least three additional left paramidline ventral hernias without evidence of complication.  These results were called by telephone on 12/19/2011  at  1055 hrs to  Dr Edison Pace, who verbally  acknowledged these results.  Original Report Authenticated By: Julian Hy, M.D.   Dg Chest Port 1 View  12/21/2011  *RADIOLOGY REPORT*  Clinical Data: Respiratory distress.  PORTABLE CHEST - 1 VIEW  Comparison: 12/20/2011.  Findings: The support apparatus is stable.  Persistent low lung volumes with vascular crowding and atelectasis.  IMPRESSION:  1.  Stable support apparatus. 2.  Persistent low lung volumes with vascular crowding and atelectasis.  Original Report Authenticated By: P. Kalman Jewels, M.D.   Dg Chest Port 1 View  12/20/2011  *RADIOLOGY REPORT*  Clinical Data: Respiratory failure.  PORTABLE CHEST - 1 VIEW  Comparison: 12/19/2011.  Findings: The endotracheal tube is 4 cm above the carina.  The right IJ catheter and NG tube are stable.  Persistent low lung volumes with vascular crowding and bibasilar atelectasis.  No definite pleural effusions or pneumothorax.  IMPRESSION:  1.  Stable support apparatus. 2.  Persistent low lung volumes with vascular crowding and bibasilar atelectasis.  Original Report Authenticated By: P. Kalman Jewels, M.D.   Dg Chest Port 1 View  12/19/2011  *RADIOLOGY REPORT*  Clinical Data: Central line placement.  PORTABLE CHEST - 1 VIEW  Comparison: Chest x-ray 09/05/2008.  Findings: There is a left-sided internal jugular central venous catheter with tip terminating in the proximal superior vena cava. An endotracheal tube is in place with tip 4.6 cm above the carina.A nasogastric tube is seen extending into the stomach, however, the tip of the nasogastric tube extends below the lower margin of the image.  Lung volumes are low.  There are bibasilar opacities (right greater than left), which may represent areas of atelectasis and/or consolidation.  Linear opacities are also noted in the mid lung bilaterally, most can consistent with areas of atelectasis.  No definite pleural effusions.  The patient is rotated to the right on today's exam, resulting in distortion of the  mediastinal contours and reduced diagnostic sensitivity and specificity for mediastinal pathology.  This rotation makes heart size difficult to assess as well.  IMPRESSION: 1.  Support apparatus, as above. 2.  Low lung volumes with bibasilar atelectasis and/or consolidation.  There is also extensive atelectasis in the mid lungs bilaterally.  Original Report Authenticated By: Etheleen Mayhew, M.D.   Dg Abd Acute W/chest  12/19/2011  *RADIOLOGY REPORT*  Clinical Data: Umbilical pain, nausea, history of partial colectomy  ACUTE ABDOMEN SERIES (ABDOMEN 2 VIEW & CHEST 1 VIEW)  Comparison: Chest x-ray of 09/05/2008 and CT abdomen pelvis of 07/04/2005  Findings: The lungs are not optimally aerated but no focal infiltrate or effusion is seen.  There is an air-fluid level overlying the medial left lung base probably within a moderate sized hiatal hernia. Mild cardiomegaly is present.  Supine and erect views of the abdomen show a few scattered air- fluid levels throughout the small bowel without definite obstruction.  This may reflect mild ileus or local inflammatory process.  No free intraperitoneal air is seen.  No distention of the colon is noted.  Surgical clips are present in the left lower quadrant.  IMPRESSION:  1.  Possible mild ileus.  No definite obstruction or free air. 2.  Poor inspiration.  No active process. 3.  Probable hiatal hernia.  Original Report Authenticated By: Melina Copa.  Alvester Chou, M.D.    Anti-infectives: Anti-infectives     Start     Dose/Rate Route Frequency Ordered Stop   12/19/11 1100   ertapenem (INVANZ) 1 g in sodium chloride 0.9 % 50 mL IVPB        1 g 100 mL/hr over 30 Minutes Intravenous Every 24 hours 12/19/11 1039            Assessment/Plan: s/p Procedure(s): EXPLORATORY LAPAROTOMY SMALL BOWEL RESECTION for perforation and peritonitis, abdominal wall repair with MTF, VAC Sepsis with VDRF.  Critically ill Vent management per CCM Continue NG, abx, wound care, support.      LOS: 2 days    Roselle Norton T 12/21/2011

## 2011-12-21 NOTE — Progress Notes (Signed)
Name: [name] MRN: [id number] DOB: [birth year]    LOS: 2  Referring Provider:  Olen Pel Reason for Referral:  Postop vent management  PULMONARY / CRITICAL CARE MEDICINE  Pt Profile: Admitted to ICU post laparotomy for bowel perforation and small bowel resection. PCCM asked to assist in post op vent and CCM issues  HPI:  61 year old gentleman with a complicated abdominal history. In 2005 he underwent a colon resection for diverticulitis at [facility]. With multiple postop complications including peritonitis, open abdominal wound, ileostomy. In 2006 he had the ileostomy revised. Later in the year he underwent a ventral hernia repair by Dr. Hassell Done with anterior fascial repair using ultra Pro mesh Pt awoke early AM 12/19/11 with acute abdominal pain. Found to have small bowel perforation at time of ex lap 6/4.  CCM consulted postop.   Brief patient description:  61 yo on vent sedated  Events Since Admission: .   Current Status: remains on vent 6/6 profound mixed acidosis, hypoxia, svt  Vital Signs: Temp:  [98.8 F (37.1 C)-101.6 F (38.7 C)] 101 F (38.3 C) (06/06 0400) Pulse Rate:  [97-126] 107  (06/06 0700) Resp:  [11-16] 12  (06/06 0700) BP: (106-149)/(52-76) 141/58 mmHg (06/06 0700) SpO2:  [91 %-96 %] 92 % (06/06 0700) FiO2 (%):  [40 %-100 %] 100 % (06/06 0700) Weight:  [252 lb 6.8 oz (114.5 kg)] 252 lb 6.8 oz (114.5 kg) (06/06 0000)  Intake/Output Summary (Last 24 hours) at 12/21/11 0957 Last data filed at 12/21/11 0900  Gross per 24 hour  Intake   3461 ml  Output   2175 ml  Net   1286 ml   Physical Examination: General:  , intubated, sedated Neuro:  Sedated but maex4 HEENT:  WNL Neck:  No JVD Cardiovascular:   regular, no murmurs. Hr 89 on dilt drip Lungs:  Coarse rhonchi diminished on left. fio2 @ 100% Abdomen:  Mildly distended, tympanitic, no BS, wound vac in place. nsc on 6/6 Ext: no edema  Principal Problem:  *Status post laparotomy Active  Problems:  Small bowel perforation  Respiratory failure following trauma and surgery  Sinus tachycardia  Peritonitis  Severe sepsis  ASSESSMENT AND PLAN  PULMONARY  Lab 12/21/11 0355 12/20/11 1023 12/19/11 2225 12/19/11 1640  PHART 7.232* 7.309* -- 7.207*  PCO2ART 68.1* 48.3* -- 46.7*  PO2ART 91.7 71.1* -- 78.1*  HCO3 26.9* 23.5 -- 17.9*  O2SAT 96.1 93.9 73.1 93.5   Ventilator Settings: Vent Mode:  [-] PRVC FiO2 (%):  [40 %-100 %] 100 % Set Rate:  [12 bmp] 12 bmp Vt Set:  [600 mL] 600 mL PEEP:  [5 cmH20] 5 cmH20 Plateau Pressure:  [18 cmH20-20 cmH20] 20 cmH20 CXR:   Ct Abdomen Pelvis W Contrast  12/19/2011  *RADIOLOGY REPORT*  Clinical Data: Abdominal pain, nausea/vomiting, known incisional hernia, history of diverticulitis with colectomy and ileostomy status post reversal  CT ABDOMEN AND PELVIS WITH CONTRAST  Technique:  Multidetector CT imaging of the abdomen and pelvis was performed following the standard protocol during bolus administration of intravenous contrast.  Contrast: 143m OMNIPAQUE IOHEXOL 300 MG/ML  SOLN  Comparison: [facility] CT abdomen pelvis dated 07/04/2005  Findings: Dependent atelectasis in the bilateral lower lobes.  Mild nodularity at the right lung base.  Liver, spleen, pancreas, and adrenal glands are within normal limits. Two splenules in the left upper abdomen.  Gallbladder is unremarkable.  No intrahepatic or extrahepatic ductal dilatation.  Kidneys are within normal limits.  No hydronephrosis.  Moderate  right ventral hernia containing multiple loops of small bowel (series 2/image 64).  The medial wall of one of these loops of small bowel appears discontinuous (series 2/image 62) with associated 6.3 x 3.4 cm gas and fluid collection/abscess within the abdominal mesentery and extending into the hernia (series 2/image 61), reflecting localized perforation.  Surrounding inflammatory stranding and a small amount of dependent fluid.  Additional small right  paramidline ventral hernia (series 2/image 50) with surrounding inflammatory changes and a small amount of fluid.  At least three left paramidline ventral hernias without evidence of complication (series 2/images 59, 64, and 74).  Prior small bowel/colonic resections with surgical anastomosis in the right mid abdomen (series 2/image 74) and right pelvis (series 2/image 87).  No evidence of abdominal aortic aneurysm.  No abdominopelvic ascites.  No suspicious abdominopelvic lymphadenopathy.  Prostatomegaly, measuring 5.0 cm in transverse dimension.  Bladder is within normal limits.  Mild degenerative changes of the lower thoracic spine.  IMPRESSION: Suspected localized perforation of a loop of small bowel within a complicated right ventral hernia.  Associated 6.3 x 3.4 cm gas and fluid collection/abscess, as described above.  Surrounding inflammatory changes with fluid.  Additional small right paramidline ventral hernia with surrounding inflammatory changes and a small amount of fluid.  At least three additional left paramidline ventral hernias without evidence of complication.  These results were called by telephone on 12/19/2011  at  1055 hrs to  Dr Edison Pace, who verbally acknowledged these results.  Original Report Authenticated By: Julian Hy, M.D.   Dg Chest Port 1 View  12/21/2011  *RADIOLOGY REPORT*  Clinical Data: Respiratory distress.  PORTABLE CHEST - 1 VIEW  Comparison: 12/20/2011.  Findings: The support apparatus is stable.  Persistent low lung volumes with vascular crowding and atelectasis.  IMPRESSION:  1.  Stable support apparatus. 2.  Persistent low lung volumes with vascular crowding and atelectasis.  Original Report Authenticated By: P. Kalman Jewels, M.D.   Dg Chest Port 1 View  12/20/2011  *RADIOLOGY REPORT*  Clinical Data: Respiratory failure.  PORTABLE CHEST - 1 VIEW  Comparison: 12/19/2011.  Findings: The endotracheal tube is 4 cm above the carina.  The right IJ catheter and NG tube are  stable.  Persistent low lung volumes with vascular crowding and bibasilar atelectasis.  No definite pleural effusions or pneumothorax.  IMPRESSION:  1.  Stable support apparatus. 2.  Persistent low lung volumes with vascular crowding and bibasilar atelectasis.  Original Report Authenticated By: P. Kalman Jewels, M.D.   Dg Chest Port 1 View  12/19/2011  *RADIOLOGY REPORT*  Clinical Data: Central line placement.  PORTABLE CHEST - 1 VIEW  Comparison: Chest x-ray 09/05/2008.  Findings: There is a left-sided internal jugular central venous catheter with tip terminating in the proximal superior vena cava. An endotracheal tube is in place with tip 4.6 cm above the carina.A nasogastric tube is seen extending into the stomach, however, the tip of the nasogastric tube extends below the lower margin of the image.  Lung volumes are low.  There are bibasilar opacities (right greater than left), which may represent areas of atelectasis and/or consolidation.  Linear opacities are also noted in the mid lung bilaterally, most can consistent with areas of atelectasis.  No definite pleural effusions.  The patient is rotated to the right on today's exam, resulting in distortion of the mediastinal contours and reduced diagnostic sensitivity and specificity for mediastinal pathology.  This rotation makes heart size difficult to assess as well.  IMPRESSION: 1.  Support apparatus, as above. 2.  Low lung volumes with bibasilar atelectasis and/or consolidation.  There is also extensive atelectasis in the mid lungs bilaterally.  Original Report Authenticated By: Etheleen Mayhew, M.D.   Dg Abd Acute W/chest  12/19/2011  *RADIOLOGY REPORT*  Clinical Data: Umbilical pain, nausea, history of partial colectomy  ACUTE ABDOMEN SERIES (ABDOMEN 2 VIEW & CHEST 1 VIEW)  Comparison: Chest x-ray of 09/05/2008 and CT abdomen pelvis of 07/04/2005  Findings: The lungs are not optimally aerated but no focal infiltrate or effusion is seen.  There is an  air-fluid level overlying the medial left lung base probably within a moderate sized hiatal hernia. Mild cardiomegaly is present.  Supine and erect views of the abdomen show a few scattered air- fluid levels throughout the small bowel without definite obstruction.  This may reflect mild ileus or local inflammatory process.  No free intraperitoneal air is seen.  No distention of the colon is noted.  Surgical clips are present in the left lower quadrant.  IMPRESSION:  1.  Possible mild ileus.  No definite obstruction or free air. 2.  Poor inspiration.  No active process. 3.  Probable hiatal hernia.  Original Report Authenticated By: Joretta Bachelor, M.D.    ETT:  6/4>>>  A:  Post op resp failure, vent dependence - ALI 6/6, hypoxia  P:   Vent settings established. DVT, SUP ordered. Sedation/analgesia ordered. Daily WUA/SBT - need to understand CCS plans prior to intubation. No resistance from CCS to extubation. 6/6 increased fio2 needs, hypercarbia, mixed acidosis. Increased rr to 24. Plt pressures ok but may need to go to ards protocol in  Future.  Lactic acid is decreased and pco2 is increased therefore most likely mostly pulmonary. No wean 6/6. Recheck abg post vent change. Recheck c x r to insure tube not malpositioned 6/6.  CARDIOVASCULAR  Lab 12/20/11 1035 12/19/11 1839 12/19/11 0725  TROPONINI -- -- --  LATICACIDVEN 2.1 5.4* 3.2*  PROBNP -- -- --   ECG:   Lines: PIV  A: Sinus tachycardia - reactive (SIRS, pain etc). Not sure if he was on beta blockers PTA. 6/6 6/6 tx with dilt. Suspect combination of catecholamines from agitation and acidosis.  P:  Control pain, fever Resuscitate with volume PRN metoprolol Correct acidosis dilt drip prn RENAL  Lab 12/21/11 0400 12/20/11 1400 12/20/11 0510 12/19/11 0725  NA 142 140 138 141  K 4.4 4.8 -- --  CL 108 108 106 104  CO2 28 25 24 24   BUN 20 22 18 13   CREATININE 1.33 1.58* 1.70* 1.00  CALCIUM 7.6* 7.4* 7.4* 9.8  MG 1.8 -- -- --    PHOS 2.1* -- -- --   Intake/Output      06/05 0701 - 06/06 0700 06/06 0701 - 06/07 0700   I.V. (mL/kg) 3299.8 (28.8)    NG/GT     IV Piggyback 143    Total Intake(mL/kg) 3442.8 (30.1)    Urine (mL/kg/hr) 1930 (0.7)    Emesis/NG output 100    Drains 0    Other     Blood     Total Output 2030    Net +1412.8          Foley:  6/4 >>   A:  Mild renal insuff P:   IVFs ordered Monitor chemistries and renal function  GASTROINTESTINAL  Lab 12/19/11 0725  AST 76*  ALT 42  ALKPHOS 87  BILITOT 0.4  PROT 7.8  ALBUMIN 4.3  A:  Post laparotomy P:   Mgmt per CCS.   HEMATOLOGIC  Lab 12/21/11 0400 12/20/11 0510 12/19/11 2241 12/19/11 2035 12/19/11 0725  HGB 12.4* 14.7 15.7 -- 18.1*  HCT 40.1 45.3 48.2 -- 53.2*  PLT 168 235 -- -- 261  INR -- -- -- 1.13 --  APTT -- -- -- -- --   A:  Hemoconcentration P:  Volume resuscitate. Monitor CBC.  INFECTIOUS  Lab 12/21/11 0400 12/20/11 0510 12/19/11 1838 12/19/11 0725  WBC 17.1* 19.1* -- 20.8*  PROCALCITON -- -- 3.63 --   Cultures: 6/6 bc x 2>> 6/6 uc>> 6/6 sputum>>  Antibiotics: Ertapenem 6/4 >>   A:  Severe sepsis, Peritonitis due to bowel perf P:   Cont Envanz. 6/6 pan culture  ENDOCRINE  Lab 12/19/11 2302  GLUCAP 148*   A:  No active issues P:   SSI if glu > 180.  NEUROLOGIC  A:  No active issues P:   Sedation/analgesia as ordered.  BEST PRACTICE / DISPOSITION Level of Care:  ICU Primary Service:  CCS Consultants:  PCCM Code Status:  Full Diet:  none DVT Px:  SCDs GI Px:  PPI Social / Family:     Richardson Landry Minor ACNP Maryanna Shape PCCM Pager [phone] till 3 pm If no answer page [phone] 12/21/2011, 7:49 AM  Will maintain intubated for now, adjust vent for pH and increase PEEP to 10.  Continue abx and IVF for now and pending renal function will adjust I/O.  CC time 35 min.  Patient seen and examined, agree with above note.  I dictated the care and orders written for this patient under my  direction.  Jennet Maduro, M.D. [phone]

## 2011-12-21 NOTE — Procedures (Signed)
Mini BAL Procedure Note [name] [id number] 30-Jul-1950  Procedure: Mini Bronchial Alveolar Lavage  Procedure Details: In preparation for procedure, Patient hyper-oxygenated with 100 % FiO2 Sterile Technique used:gloves, gown and mask Amount of Saline administered: 30 (ml) Specimen amount collected: 4cc (ml)  Evaluation: BP 122/61  Pulse 97  Temp(Src) 101.4 F (38.6 C) (Axillary)  Resp 23  Ht 5' 10"  (1.778 m)  Wt 252 lb 6.8 oz (114.5 kg)  BMI 36.22 kg/m2  SpO2 98% O2 sats: stable throughout Breath Sounds: Clear and Diminished Patient's Current Condition: stable Complications: No apparent complications Patient did tolerate procedure well.   [name] M 12/21/2011, 10:34 AM

## 2011-12-21 NOTE — Progress Notes (Signed)
INITIAL ADULT NUTRITION ASSESSMENT Date: 12/21/2011   Time: 11:21 AM Reason for Assessment: vent  ASSESSMENT: Male 61 y.o.  Dx: Status post laparotomy for bowel perforation and small bowel resection.  Peritonitis, severe sepsis/SIRS, Respiratory failure following surgery.  Milk renal insuff  Hx:  Past Medical History  Diagnosis Date  . Diverticulitis of colon   . Hepatitis 1975    unknown type   . Asthma   . GERD (gastroesophageal reflux disease)   . Hiatal hernia   . BPH (benign prostatic hypertrophy) with urinary obstruction   . Psoriasis     sees [name] at [facility].   Past Surgical History  Procedure Date  . Hemicolectomy     left side, at [facility]  . Ileostomy   . Colonoscopy   . Cystoscopy   . Bronchoscopy   . Tonsillectomy   . Vasectomy   . Hernia repair     incisional hernia  . Laparotomy 12/19/2011    Procedure: EXPLORATORY LAPAROTOMY;  Surgeon: [name], MD;  Location: [facility] ORS;  Service: General;  Laterality: N/A;  . Bowel resection 12/19/2011    Procedure: SMALL BOWEL RESECTION;  Surgeon: [name], MD;  Location: [facility] ORS;  Service: General;  Laterality: N/A;  with anastamosis and insertion mesh    Related Meds: heparin, protonix    Ht: 5' 10"  (177.8 cm)  Wt: 252 lb 6.8 oz (114.5 kg)  Ideal Wt: 75 kg % Ideal Wt: 152  Usual Wt:  Wt Readings from Last 10 Encounters:  12/21/11 252 lb 6.8 oz (114.5 kg)  12/21/11 252 lb 6.8 oz (114.5 kg)  12/12/11 238 lb (107.956 kg)  10/17/11 239 lb (108.41 kg)  08/02/11 243 lb (110.224 kg)  07/17/11 239 lb (108.41 kg)  02/23/11 230 lb (104.327 kg)  05/30/10 222 lb (100.699 kg)  02/15/10 231 lb (104.781 kg)  12/25/09 227 lb (102.967 kg)   % Usual Wt: 106  Body mass index is 36.22 kg/(m^2).  Meets criteria for obesity grade 2  Food/Nutrition Related Hx: Spoke with wife.  Pt ate well prior to admit with no known weight loss.  Regular diet.  Pt awake, alert, making eye contact and able  to answer questions with a head nod.  Labs:  CMP     Component Value Date/Time   NA 142 12/21/2011 0400   K 4.4 12/21/2011 0400   CL 108 12/21/2011 0400   CO2 28 12/21/2011 0400   GLUCOSE 130* 12/21/2011 0400   BUN 20 12/21/2011 0400   CREATININE 1.33 12/21/2011 0400   CALCIUM 7.6* 12/21/2011 0400   PROT 7.8 12/19/2011 0725   ALBUMIN 4.3 12/19/2011 0725   AST 76* 12/19/2011 0725   ALT 42 12/19/2011 0725   ALKPHOS 87 12/19/2011 0725   BILITOT 0.4 12/19/2011 0725   GFRNONAA 57* 12/21/2011 0400   GFRAA 67* 12/21/2011 0400    I/O last 3 completed shifts: In: 5137.5 [I.V.:4921.5; NG/GT:30; IV Piggyback:186] Out: 2965 [Urine:2615; Emesis/NG output:200; Drains:150] Total I/O In: 378.5 [I.V.:292.5; IV Piggyback:86] Out: 230 [Urine:230]   Diet Order: NPO  Supplements/Tube Feeding:  none  IVF:    sodium chloride Last Rate: 75 mL/hr at 12/21/11 0828  diltiazem (CARDIZEM) infusion Last Rate: 15 mg/hr (12/21/11 0700)  fentaNYL infusion INTRAVENOUS Last Rate: Stopped (12/21/11 0834)    Estimated Nutritional Needs:   Kcal: 1500-1750 (based on ASPEN permissive underfeeding guidelines.) Protein: 115-125 gm protein Fluid: >2L  Pt intubated with NG tube in place-suction.  Pt reports  no BM.  Not passing flatus.  Well nourished, obese on admit.  Now intubated with SIRS, VAC.  NUTRITION DIAGNOSIS: -Inadequate oral intake (NI-2.1).  Status: Ongoing  RELATED TO: vent  AS EVIDENCE BY: npo status  MONITORING/EVALUATION(Goals): Monitor:  Plan of care, labs, weight trend, i/o Goal:  Based on plan of care  EDUCATION NEEDS: -No education needs identified at this time  INTERVENTION: Rec TPN if unable to extubate and begin diet.    Dietitian [phone]  DOCUMENTATION CODES Per approved criteria  -Obesity grade 2    Adebayo Ensminger, Higinio Roger 12/21/2011, 11:21 AM

## 2011-12-21 NOTE — Progress Notes (Signed)
[facility] Progress Note Patient Name: [name] DOB: 1950/09/11 MRN: [id number]  Date of Service  12/21/2011   HPI/Events of Note  Patient with SVT with rates to 190s.  HD stable.  Given 6 of adenosine IV with slowing of HR to 90s but then up to 190s again within few minutes.  Initially slowed rhythm looked sinus but then AFib/Flutter.     eICU Interventions  Plan: 10 mg diltiazem Diltiazem gtt Cardiac enzymes 12 lead EKG   Intervention Category Intermediate Interventions: Arrhythmia - evaluation and management  Hilarie Sinha 12/21/2011, 6:45 AM

## 2011-12-22 ENCOUNTER — Inpatient Hospital Stay (HOSPITAL_COMMUNITY): Payer: 59

## 2011-12-22 LAB — CBC
Hemoglobin: 11.3 g/dL — ABNORMAL LOW (ref 13.0–17.0)
MCHC: 31.7 g/dL (ref 30.0–36.0)
RBC: 3.86 MIL/uL — ABNORMAL LOW (ref 4.22–5.81)
WBC: 14.1 10*3/uL — ABNORMAL HIGH (ref 4.0–10.5)

## 2011-12-22 LAB — BASIC METABOLIC PANEL
Chloride: 112 mEq/L (ref 96–112)
GFR calc non Af Amer: 70 mL/min — ABNORMAL LOW (ref >90)
Glucose, Bld: 134 mg/dL — ABNORMAL HIGH (ref 70–99)
Potassium: 3.7 mEq/L (ref 3.5–5.1)
Sodium: 145 mEq/L (ref 135–145)

## 2011-12-22 LAB — LACTIC ACID, PLASMA: Lactic Acid, Venous: 1.3 mmol/L (ref 0.5–2.2)

## 2011-12-22 MED ORDER — FENTANYL CITRATE 0.05 MG/ML IJ SOLN
25.0000 ug | INTRAMUSCULAR | Status: DC | PRN
  Administered 2011-12-22: 100 ug via INTRAVENOUS
  Administered 2011-12-22 – 2011-12-23 (×4): 50 ug via INTRAVENOUS
  Filled 2011-12-22 (×5): qty 2

## 2011-12-22 NOTE — Progress Notes (Signed)
Patient ID: LYN DEEMER, male   DOB: 11-05-1950, 61 y.o.   MRN: [id number] 3 Days Post-Op  Subjective: Extubated, alert.  Abdominal pain tolerable, denies SOB  Objective: Vital signs in last 24 hours: Temp:  [99.3 F (37.4 C)-101 F (38.3 C)] 99.3 F (37.4 C) (06/07 0828) Pulse Rate:  [61-96] 81  (06/07 0700) Resp:  [20-22] 21  (06/07 0700) BP: (92-166)/(48-81) 166/64 mmHg (06/07 0700) SpO2:  [93 %-98 %] 97 % (06/07 0700) FiO2 (%):  [40 %-50 %] 40 % (06/07 0930) Weight:  [248 lb 3.8 oz (112.6 kg)-257 lb 12.8 oz (116.937 kg)] 257 lb 12.8 oz (116.937 kg) (06/07 0003)    Intake/Output from previous day: 06/06 0701 - 06/07 0700 In: 2596.8 [I.V.:2460.8; IV Piggyback:136] Out: 2100 [Urine:1475; Emesis/NG output:525; Drains:100] Intake/Output this shift:    General appearance: alert, mild distress and slowed mentation Resp: rhonchi bilaterally, no increased work of breathing GI: abnormal findings:  mild tenderness in the entire abdomen Incision/Wound: VAC in place clean and dry  Lab Results:   Basename 12/22/11 0500 12/21/11 0400  WBC 14.1* 17.1*  HGB 11.3* 12.4*  HCT 35.7* 40.1  PLT 174 168   BMET  Basename 12/22/11 0500 12/21/11 0400  NA 145 142  K 3.7 4.4  CL 112 108  CO2 26 28  GLUCOSE 134* 130*  BUN 20 20  CREATININE 1.13 1.33  CALCIUM 7.8* 7.6*     Studies/Results: Dg Chest Port 1 View  12/22/2011  *RADIOLOGY REPORT*  Clinical Data: Endotracheal tube position.  PORTABLE CHEST - 1 VIEW  Comparison: 12/21/2011.  Findings: The support apparatus is stable.  The lungs demonstrate improved aeration with resolving vascular congestion and atelectasis.  No definite effusions.  IMPRESSION:  1.  Stable support apparatus. 2.  Improved basilar lung aeration.  Original Report Authenticated By: P. Kalman Jewels, M.D.   Dg Chest Port 1 View  12/21/2011  *RADIOLOGY REPORT*  Clinical Data: Diminished left breath sounds  PORTABLE CHEST - 1 VIEW  Comparison: 12/21/2011   Findings: Cardiomediastinal silhouette is stable.  Stable NG tube and endotracheal tube position.  Stable left IJ central line position.  Central mild vascular congestion.  Persistent crowding of bronchovascular markings.  Low lung volumes with bilateral basilar atelectasis or infiltrate.  IMPRESSION:  Stable NG tube and endotracheal tube position.  Stable left IJ central line position.  Central mild vascular congestion. Persistent crowding of bronchovascular markings.  Low lung volumes with bilateral basilar atelectasis or infiltrate.  Original Report Authenticated By: Lahoma Crocker, M.D.   Dg Chest Port 1 View  12/21/2011  *RADIOLOGY REPORT*  Clinical Data: Respiratory distress.  PORTABLE CHEST - 1 VIEW  Comparison: 12/20/2011.  Findings: The support apparatus is stable.  Persistent low lung volumes with vascular crowding and atelectasis.  IMPRESSION:  1.  Stable support apparatus. 2.  Persistent low lung volumes with vascular crowding and atelectasis.  Original Report Authenticated By: P. Kalman Jewels, M.D.    Anti-infectives: Anti-infectives     Start     Dose/Rate Route Frequency Ordered Stop   12/19/11 1100   ertapenem (INVANZ) 1 g in sodium chloride 0.9 % 50 mL IVPB        1 g 100 mL/hr over 30 Minutes Intravenous Every 24 hours 12/19/11 1039            Assessment/Plan: s/p Procedure(s): EXPLORATORY LAPAROTOMY SMALL BOWEL RESECTION Improving Cont abx, NG.  Pulm toilet   LOS: 3 days    Davion Meara T 12/22/2011

## 2011-12-22 NOTE — Anesthesia Postprocedure Evaluation (Signed)
Anesthesia Post Note  Patient: [name]  Procedure(s) Performed: Procedure(s) (LRB): EXPLORATORY LAPAROTOMY (N/A) SMALL BOWEL RESECTION (N/A)  Anesthesia type: General  Patient location: PACU  Post pain: Pain level controlled  Post assessment: Post-op Vital signs reviewed  Last Vitals:  Filed Vitals:   12/22/11 1212  BP:   Pulse:   Temp: 36.6 C  Resp:     Post vital signs: Reviewed  Level of consciousness: sedated  Complications: No apparent anesthesia complications

## 2011-12-22 NOTE — Consult Note (Signed)
WOC consult Note Reason for Consult:Patient seen for NPWT dressing change per Dr. Lear Ng request Wound type:surgical Pressure Ulcer POA: No Measurement:16cm x 6.5cm x 4cm Wound YNX:GZFPO, pink with small piece of mesh evident in base of wound (measures approximately 2cm x 3cm) Drainage (amount, consistency, odor) moderate amount of serous to serosanguinous exudate Periwound:intact Dressing procedure/placement/frequency:NPWT ([employer] V.A.C) using piece of white foam topped with black granufoam and secured with drape. 177mHg continuous pressure tolerated well with pre-medication (126mDilaudid). Seal achieved immediately. I will follow.  Please re-consult if needed in-between visits. Thanks, LaMaudie FlakesMSN, RN, GNFloyd Cherokee Medical [phone]

## 2011-12-22 NOTE — Progress Notes (Signed)
Name: [name] MRN: [id number] DOB: [birth year]    LOS: 3  Referring Provider:  Olen Pel Reason for Referral:  Postop vent management  PULMONARY / CRITICAL CARE MEDICINE  Pt Profile: Admitted to ICU post laparotomy for bowel perforation and small bowel resection. PCCM asked to assist in post op vent and CCM issues  HPI:  61 year old gentleman with a complicated abdominal history. In 2005 he underwent a colon resection for diverticulitis at [facility]. With multiple postop complications including peritonitis, open abdominal wound, ileostomy. In 2006 he had the ileostomy revised. Later in the year he underwent a ventral hernia repair by Dr. Hassell Done with anterior fascial repair using ultra Pro mesh Pt awoke early AM 12/19/11 with acute abdominal pain. Found to have small bowel perforation at time of ex lap 6/4.  CCM consulted postop.   Brief patient description:  61 yo on vent very awake. Events Since Admission: .   Current Status: remains on vent 6/6 profound mixed acidosis, hypoxia, svt 6/7 looks much improved on 6/7 Vital Signs: Temp:  [99.3 F (37.4 C)-101 F (38.3 C)] 99.3 F (37.4 C) (06/07 0828) Pulse Rate:  [61-96] 81  (06/07 0700) Resp:  [20-22] 21  (06/07 0700) BP: (92-166)/(48-81) 166/64 mmHg (06/07 0700) SpO2:  [93 %-98 %] 97 % (06/07 0700) FiO2 (%):  [40 %-50 %] 40 % (06/07 0930) Weight:  [248 lb 3.8 oz (112.6 kg)-257 lb 12.8 oz (116.937 kg)] 257 lb 12.8 oz (116.937 kg) (06/07 0003)  Intake/Output Summary (Last 24 hours) at 12/22/11 1000 Last data filed at 12/22/11 0600  Gross per 24 hour  Intake 2128.34 ml  Output   1870 ml  Net 258.34 ml   Physical Examination: General:  , intubated,  Neuro:  Awake maex4 HEENT:  WNL Neck:  No JVD Cardiovascular:   regular, no murmurs. Hr 89 on dilt drip Lungs:  CTA Abdomen:  Mildly distended, tympanitic, no BS, wound vac in place. nsc on 6/6 Ext: no edema  Principal Problem:  *Status post laparotomy Active  Problems:  Small bowel perforation  Respiratory failure following trauma and surgery  Sinus tachycardia  Peritonitis  Severe sepsis  ASSESSMENT AND PLAN  PULMONARY  Lab 12/21/11 0925 12/21/11 0355 12/20/11 1023 12/19/11 2225 12/19/11 1640  PHART 7.460* 7.232* 7.309* -- 7.207*  PCO2ART 35.8 68.1* 48.3* -- 46.7*  PO2ART 86.5 91.7 71.1* -- 78.1*  HCO3 25.1* 26.9* 23.5 -- 17.9*  O2SAT 97.8 96.1 93.9 73.1 93.5   Ventilator Settings: Vent Mode:  [-] PSV;CPAP FiO2 (%):  [40 %-50 %] 40 % Set Rate:  [20 bmp] 20 bmp Vt Set:  [600 mL] 600 mL PEEP:  [5 cmH20-10 cmH20] 5 cmH20 Pressure Support:  [5 cmH20] 5 cmH20 Plateau Pressure:  [20 cmH20-23 cmH20] 20 cmH20 CXR:   Dg Chest Port 1 View  12/22/2011  *RADIOLOGY REPORT*  Clinical Data: Endotracheal tube position.  PORTABLE CHEST - 1 VIEW  Comparison: 12/21/2011.  Findings: The support apparatus is stable.  The lungs demonstrate improved aeration with resolving vascular congestion and atelectasis.  No definite effusions.  IMPRESSION:  1.  Stable support apparatus. 2.  Improved basilar lung aeration.  Original Report Authenticated By: P. Kalman Jewels, M.D.   Dg Chest Port 1 View  12/21/2011  *RADIOLOGY REPORT*  Clinical Data: Diminished left breath sounds  PORTABLE CHEST - 1 VIEW  Comparison: 12/21/2011  Findings: Cardiomediastinal silhouette is stable.  Stable NG tube and endotracheal tube position.  Stable left IJ central line position.  Central mild vascular congestion.  Persistent crowding of bronchovascular markings.  Low lung volumes with bilateral basilar atelectasis or infiltrate.  IMPRESSION:  Stable NG tube and endotracheal tube position.  Stable left IJ central line position.  Central mild vascular congestion. Persistent crowding of bronchovascular markings.  Low lung volumes with bilateral basilar atelectasis or infiltrate.  Original Report Authenticated By: Lahoma Crocker, M.D.   Dg Chest Port 1 View  12/21/2011  *RADIOLOGY REPORT*  Clinical  Data: Respiratory distress.  PORTABLE CHEST - 1 VIEW  Comparison: 12/20/2011.  Findings: The support apparatus is stable.  Persistent low lung volumes with vascular crowding and atelectasis.  IMPRESSION:  1.  Stable support apparatus. 2.  Persistent low lung volumes with vascular crowding and atelectasis.  Original Report Authenticated By: P. Kalman Jewels, M.D.    ETT:  6/4>>>6/7  A:  Post op resp failure, vent dependence - ALI 6/6, hypoxia  P:   Vent settings established. DVT, SUP ordered. Sedation/analgesia ordered. Daily WUA/SBT - need to understand CCS plans prior to intubation. No resistance from CCS to extubation. 6/6 increased fio2 needs, hypercarbia, mixed acidosis. Increased rr to 24. Plt pressures ok but may need to go to [facility] protocol in  Future.  Lactic acid is decreased and pco2 is increased therefore most likely mostly pulmonary. No wean 6/6. Recheck abg post vent change. Recheck c x r to insure tube not malpositioned 6/6. 6/7 much improved. Meets criteria for extubation. Will extubate.  CARDIOVASCULAR  Lab 12/22/11 0500 12/21/11 2209 12/21/11 1426 12/21/11 0855 12/20/11 1035 12/19/11 1839 12/19/11 0725  TROPONINI -- <0.30 <0.30 <0.30 -- -- --  LATICACIDVEN 1.3 -- -- -- 2.1 5.4* 3.2*  PROBNP -- -- -- -- -- -- --   ECG:   Lines: PIV  A: Sinus tachycardia - reactive (SIRS, pain etc). Not sure if he was on beta blockers PTA. 6/6 6/6 tx with dilt. Suspect combination of catecholamines from agitation and acidosis.  P:  Control pain, fever Resuscitated with volume PRN metoprolol Correct acidosis, done 6/6 dilt drip prn dc 6/7 RENAL  Lab 12/22/11 0500 12/21/11 0400 12/20/11 1400 12/20/11 0510 12/19/11 0725  NA 145 142 140 138 141  K 3.7 4.4 -- -- --  CL 112 108 108 106 104  CO2 26 28 25 24 24   BUN 20 20 22 18 13   CREATININE 1.13 1.33 1.58* 1.70* 1.00  CALCIUM 7.8* 7.6* 7.4* 7.4* 9.8  MG -- 1.8 -- -- --  PHOS -- 2.1* -- -- --   Intake/Output      06/06 0701 -  06/07 0700 06/07 0701 - 06/08 0700   I.V. (mL/kg) 2460.8 (21)    IV Piggyback 136    Total Intake(mL/kg) 2596.8 (22.2)    Urine (mL/kg/hr) 1475 (0.5)    Emesis/NG output 525    Drains 100    Total Output 2100    Net +496.8          Foley:  6/4 >>   A:  Mild renal insuff Lab Results  Component Value Date   CREATININE 1.13 12/22/2011   CREATININE 1.33 12/21/2011   CREATININE 1.58* 12/20/2011    P:   IVFs ordered Monitor chemistries and renal function 6/7 resolved  GASTROINTESTINAL  Lab 12/19/11 0725  AST 76*  ALT 42  ALKPHOS 87  BILITOT 0.4  PROT 7.8  ALBUMIN 4.3    A:  Post laparotomy P:   Mgmt per CCS.   HEMATOLOGIC  Lab 12/22/11 0500 12/21/11 0400 12/20/11  0510 12/19/11 2241 12/19/11 2035 12/19/11 0725  HGB 11.3* 12.4* 14.7 15.7 -- 18.1*  HCT 35.7* 40.1 45.3 48.2 -- 53.2*  PLT 174 168 235 -- -- 261  INR -- -- -- -- 1.13 --  APTT -- -- -- -- -- --   A:  Hemoconcentration P:  Volume resuscitate. Monitor CBC.  INFECTIOUS  Lab 12/22/11 0500 12/21/11 0830 12/21/11 0400 12/20/11 0510 12/19/11 1838 12/19/11 0725  WBC 14.1* -- 17.1* 19.1* -- 20.8*  PROCALCITON -- 9.15 -- -- 3.63 --   Cultures: 6/6 bc x 2>> 6/6 uc>> 6/6 sputum>>  Antibiotics: Ertapenem 6/4 >>   A:  Severe sepsis, Peritonitis due to bowel perf P:   Cont Envanz. 6/6 pan cultured  ENDOCRINE  Lab 12/19/11 2302  GLUCAP 148*   A:  No active issues P:   SSI if glu > 180.  NEUROLOGIC  A:  No active issues, intact P:   Sedation/analgesia as ordered.  BEST PRACTICE / DISPOSITION Level of Care:  ICU Primary Service:  CCS Consultants:  PCCM Code Status:  Full Diet:  none DVT Px:  SCDs GI Px:  PPI Social / Family:    Richardson Landry Minor ACNP Maryanna Shape PCCM Pager [phone] till 3 pm If no answer page [phone] 12/22/2011, 10:00 AM  Patient much improved this AM, will extubate and begin progression.  CC time 35 min.  Patient seen and examined, agree with above note.  I dictated the  care and orders written for this patient under my direction.  Jennet Maduro, M.D. [phone]

## 2011-12-22 NOTE — Progress Notes (Signed)
No episodes of SVT throughout night; NSR. Remains on Cardizem drip at 5 mg/hr. On Fentanyl drip for pain at 175 mcgs, wound vac to abdominal surgical site, & on vent.

## 2011-12-22 NOTE — Progress Notes (Signed)
1040 Pt extubated to 4 LPM nasal cannula with SPO2 95%, RR 14, HR 79, and equal, if slightly diminished, bilateral breath sounds.  Excellent vocalization.

## 2011-12-22 NOTE — Procedures (Signed)
Extubation Procedure Note  Patient Details:   Name: [name] DOB: 1950-07-24 MRN: [id number]   Airway Documentation:  AIRWAYS 8 mm (Active)  Secured at (cm) 21 cm 12/19/2011 12:45 PM  Measured From Lips 12/19/2011 12:45 PM  Secured Location Right 12/19/2011 12:45 PM  Secured By Rana Snare Tape 12/19/2011 12:45 PM    Evaluation  O2 sats: stable throughout Complications: No apparent complications Patient did tolerate procedure well. Bilateral Breath Sounds: Clear;Diminished   Yes  Campbell Lerner 12/22/2011, 11:30 AM

## 2011-12-23 ENCOUNTER — Inpatient Hospital Stay (HOSPITAL_COMMUNITY): Payer: 59

## 2011-12-23 DIAGNOSIS — J209 Acute bronchitis, unspecified: Secondary | ICD-10-CM

## 2011-12-23 DIAGNOSIS — J019 Acute sinusitis, unspecified: Secondary | ICD-10-CM

## 2011-12-23 LAB — BASIC METABOLIC PANEL
Chloride: 115 mEq/L — ABNORMAL HIGH (ref 96–112)
GFR calc Af Amer: >90 mL/min (ref >90)
GFR calc non Af Amer: 78 mL/min — ABNORMAL LOW (ref >90)
Potassium: 4.3 mEq/L (ref 3.5–5.1)
Sodium: 149 mEq/L — ABNORMAL HIGH (ref 135–145)

## 2011-12-23 LAB — URINE CULTURE
Colony Count: NO GROWTH
Culture  Setup Time: 201306070118
Culture: NO GROWTH
Special Requests: NORMAL

## 2011-12-23 LAB — MAGNESIUM: Magnesium: 2.6 mg/dL — ABNORMAL HIGH (ref 1.5–2.5)

## 2011-12-23 LAB — CBC
HCT: 35.5 % — ABNORMAL LOW (ref 39.0–52.0)
MCHC: 31.5 g/dL (ref 30.0–36.0)
Platelets: 221 10*3/uL (ref 150–400)
RDW: 14.5 % (ref 11.5–15.5)
WBC: 11.9 10*3/uL — ABNORMAL HIGH (ref 4.0–10.5)

## 2011-12-23 LAB — PHOSPHORUS: Phosphorus: 2.5 mg/dL (ref 2.3–4.6)

## 2011-12-23 LAB — GLUCOSE, CAPILLARY: Glucose-Capillary: 105 mg/dL — ABNORMAL HIGH (ref 70–99)

## 2011-12-23 MED ORDER — HYDROMORPHONE HCL PF 1 MG/ML IJ SOLN
1.0000 mg | INTRAMUSCULAR | Status: DC | PRN
  Administered 2011-12-23 – 2011-12-24 (×4): 1 mg via INTRAVENOUS
  Administered 2011-12-24: 2 mg via INTRAVENOUS
  Administered 2011-12-24: 1 mg via INTRAVENOUS
  Administered 2011-12-24 (×2): 2 mg via INTRAVENOUS
  Administered 2011-12-25 – 2011-12-30 (×21): 1 mg via INTRAVENOUS
  Administered 2011-12-30: 2 mg via INTRAVENOUS
  Administered 2011-12-30 (×2): 1 mg via INTRAVENOUS
  Administered 2011-12-30: 2 mg via INTRAVENOUS
  Administered 2011-12-31 (×2): 1 mg via INTRAVENOUS
  Administered 2011-12-31: 2 mg via INTRAVENOUS
  Administered 2012-01-01 (×3): 1 mg via INTRAVENOUS
  Administered 2012-01-01: 2 mg via INTRAVENOUS
  Filled 2011-12-23 (×8): qty 1
  Filled 2011-12-23 (×2): qty 2
  Filled 2011-12-23 (×12): qty 1
  Filled 2011-12-23: qty 2
  Filled 2011-12-23 (×6): qty 1
  Filled 2011-12-23: qty 2
  Filled 2011-12-23: qty 1
  Filled 2011-12-23 (×2): qty 2
  Filled 2011-12-23 (×4): qty 1
  Filled 2011-12-23: qty 2
  Filled 2011-12-23 (×3): qty 1
  Filled 2011-12-23: qty 2

## 2011-12-23 MED ORDER — KCL IN DEXTROSE-NACL 20-5-0.45 MEQ/L-%-% IV SOLN
INTRAVENOUS | Status: DC
  Administered 2011-12-23 – 2011-12-24 (×3): via INTRAVENOUS
  Filled 2011-12-23 (×5): qty 1000

## 2011-12-23 MED ORDER — SODIUM CHLORIDE 0.45 % IV SOLN
INTRAVENOUS | Status: AC
  Administered 2011-12-23 – 2011-12-27 (×7): via INTRAVENOUS

## 2011-12-23 NOTE — Progress Notes (Signed)
Patient ID: [name], male   DOB: 30-Jul-1950, 61 y.o.   MRN: [id number] 4 Days Post-Op  Subjective: No C/O, good pain control, reports some small amt flatus  Objective: Vital signs in last 24 hours: Temp:  [97.9 F (36.6 C)-98.8 F (37.1 C)] 98.2 F (36.8 C) (06/08 0800) Pulse Rate:  [63-83] 63  (06/08 0700) Resp:  [7-20] 7  (06/08 0700) BP: (113-164)/(46-73) 144/65 mmHg (06/08 0700) SpO2:  [92 %-97 %] 95 % (06/08 0700)    Intake/Output from previous day: 06/07 0701 - 06/08 0700 In: 1908.1 [P.O.:150; I.V.:1708.1; IV Piggyback:50] Out: 2400 [Urine:1450; Emesis/NG output:950] Intake/Output this shift: Total I/O In: -  Out: 125 [Urine:125]  General appearance: alert and no distress GI: normal findings: soft, non-tender Incision/Wound: VAC dry and intact  Lab Results:   Basename 12/23/11 0430 12/22/11 0500  WBC 11.9* 14.1*  HGB 11.2* 11.3*  HCT 35.5* 35.7*  PLT 221 174   BMET  Basename 12/23/11 0430 12/22/11 0500  NA 149* 145  K 4.3 3.7  CL 115* 112  CO2 25 26  GLUCOSE 107* 134*  BUN 21 20  CREATININE 1.03 1.13  CALCIUM 7.7* 7.8*     Studies/Results: Dg Chest Port 1 View  12/23/2011  *RADIOLOGY REPORT*  Clinical Data: Extubation.  Asthma.  PORTABLE CHEST - 1 VIEW  Comparison: 12/22/2011 and 12/21/2011.  Findings: 0551 hours.  Nasogastric tube remains in place. Endotracheal tube and left IJ central venous catheter have been removed.  There is stable bibasilar atelectasis.  No pneumothorax or significant pleural effusion is seen.  Heart size is stable.  IMPRESSION: Stable bibasilar atelectasis.  Original Report Authenticated By: Vivia Ewing, M.D.   Dg Chest Port 1 View  12/22/2011  *RADIOLOGY REPORT*  Clinical Data: Endotracheal tube position.  PORTABLE CHEST - 1 VIEW  Comparison: 12/21/2011.  Findings: The support apparatus is stable.  The lungs demonstrate improved aeration with resolving vascular congestion and atelectasis.  No definite effusions.   IMPRESSION:  1.  Stable support apparatus. 2.  Improved basilar lung aeration.  Original Report Authenticated By: P. Kalman Jewels, M.D.    Anti-infectives: Anti-infectives     Start     Dose/Rate Route Frequency Ordered Stop   12/19/11 1100   ertapenem (INVANZ) 1 g in sodium chloride 0.9 % 50 mL IVPB        1 g 100 mL/hr over 30 Minutes Intravenous Every 24 hours 12/19/11 1039            Assessment/Plan: s/p Procedure(s): EXPLORATORY LAPAROTOMY SMALL BOWEL RESECTION Doing well.  Transfer to floor. OOB   LOS: 4 days    Kherington Meraz T 12/23/2011

## 2011-12-23 NOTE — Progress Notes (Signed)
Name: [name] MRN: [id number] DOB: 01/02/1951    LOS: 4  Referring Provider:  Olen Pel Reason for Referral:  Postop vent management  PULMONARY / CRITICAL CARE MEDICINE  Pt Profile: Admitted to ICU post laparotomy for bowel perforation and small bowel resection. PCCM asked to assist in post op vent and CCM issues  HPI:  61 year old gentleman with a complicated abdominal history. In 2005 he underwent a colon resection for diverticulitis at [facility]. With multiple postop complications including peritonitis, open abdominal wound, ileostomy. In 2006 he had the ileostomy revised. Later in the year he underwent a ventral hernia repair by Dr. Hassell Done with anterior fascial repair using ultra Pro mesh Pt awoke early AM 12/19/11 with acute abdominal pain. Found to have small bowel perforation at time of ex lap 6/4.  CCM consulted postop.   Brief patient description:  61 yo on vent very awake. Events Since Admission: .   Current Status: remains on vent 6/6 profound mixed acidosis, hypoxia, svt 6/7 looks much improved on 6/7 Vital Signs: Temp:  [97.9 F (36.6 C)-98.8 F (37.1 C)] 98.2 F (36.8 C) (06/08 0800) Pulse Rate:  [63-83] 63  (06/08 0700) Resp:  [7-20] 7  (06/08 0700) BP: (113-164)/(46-73) 144/65 mmHg (06/08 0700) SpO2:  [92 %-97 %] 95 % (06/08 0700)  Intake/Output Summary (Last 24 hours) at 12/23/11 0950 Last data filed at 12/23/11 0749  Gross per 24 hour  Intake 1721.88 ml  Output   2275 ml  Net -553.12 ml   Physical Examination: General:  Obese, alert, oriented.  Neuro: alert, oriented, moves all ext to command. HEENT:  WNL Neck:  No JVD Cardiovascular: Regular, no murmurs. Hr 89 on dilt drip Lungs: Decrease BS at the bases. Abdomen:  Mildly distended, tympanitic, no BS, wound vac in place. nsc on 6/6 Ext: no edema  Principal Problem:  *Status post laparotomy Active Problems:  Small bowel perforation  Respiratory failure following trauma and surgery  Sinus tachycardia  Peritonitis  Severe sepsis  ASSESSMENT AND PLAN  PULMONARY  Lab 12/21/11 0925 12/21/11 0355 12/20/11 1023 12/19/11 2225 12/19/11 1640  PHART 7.460* 7.232* 7.309* -- 7.207*  PCO2ART 35.8 68.1* 48.3* -- 46.7*  PO2ART 86.5 91.7 71.1* -- 78.1*  HCO3 25.1* 26.9* 23.5 -- 17.9*  O2SAT 97.8 96.1 93.9 73.1 93.5   Ventilator Settings: [place]   CXR:   Dg Chest Port 1 View  12/23/2011  *RADIOLOGY REPORT*  Clinical Data: Extubation.  Asthma.  PORTABLE CHEST - 1 VIEW  Comparison: 12/22/2011 and 12/21/2011.  Findings: 0551 hours.  Nasogastric tube remains in place. Endotracheal tube and left IJ central venous catheter have been removed.  There is stable bibasilar atelectasis.  No pneumothorax or significant pleural effusion is seen.  Heart size is stable.  IMPRESSION: Stable bibasilar atelectasis.  Original Report Authenticated By: Vivia Ewing, M.D.   Dg Chest Port 1 View  12/22/2011  *RADIOLOGY REPORT*  Clinical Data: Endotracheal tube position.  PORTABLE CHEST - 1 VIEW  Comparison: 12/21/2011.  Findings: The support apparatus is stable.  The lungs demonstrate improved aeration with resolving vascular congestion and atelectasis.  No definite effusions.  IMPRESSION:  1.  Stable support apparatus. 2.  Improved basilar lung aeration.  Original Report Authenticated By: P. Kalman Jewels, M.D.    ETT:  6/4>>>6/7  A:  Post op resp failure, vent dependence - ALI 6/6, hypoxia  P:   Extubated and doing well on [place]. DVT, SUP ordered. D/C Sedation. Diet when ok with  CCS.  CARDIOVASCULAR  Lab 12/22/11 0500 12/21/11 2209 12/21/11 1426 12/21/11 0855 12/20/11 1035 12/19/11 1839 12/19/11 0725  TROPONINI -- <0.30 <0.30 <0.30 -- -- --  LATICACIDVEN 1.3 -- -- -- 2.1 5.4* 3.2*  PROBNP -- -- -- -- -- -- --   ECG:   Lines: PIV  A: Sinus tachycardia - reactive (SIRS, pain etc). Not sure if he was on beta blockers PTA. 6/6 6/6 tx with dilt. Suspect combination of catecholamines from agitation  and acidosis.  Essentially resolved at this point. P:  Control pain, fever Resuscitated with volume PRN metoprolol Dilt drip D/Ced at 6/7  RENAL  Lab 12/23/11 0430 12/22/11 0500 12/21/11 0400 12/20/11 1400 12/20/11 0510  NA 149* 145 142 140 138  K 4.3 3.7 -- -- --  CL 115* 112 108 108 106  CO2 25 26 28 25 24   BUN 21 20 20 22 18   CREATININE 1.03 1.13 1.33 1.58* 1.70*  CALCIUM 7.7* 7.8* 7.6* 7.4* 7.4*  MG 2.6* -- 1.8 -- --  PHOS 2.5 -- 2.1* -- --   Intake/Output      06/07 0701 - 06/08 0700 06/08 0701 - 06/09 0700   P.O. 150    I.V. (mL/kg) 1708.1 (14.6)    IV Piggyback 50    Total Intake(mL/kg) 1908.1 (16.3)    Urine (mL/kg/hr) 1450 (0.5) 125   Emesis/NG output 950    Drains 0    Total Output 2400 125   Net -491.9 -125         Foley:  6/4 >>   A:  Mild renal insuff Lab Results  Component Value Date   CREATININE 1.03 12/23/2011   CREATININE 1.13 12/22/2011   CREATININE 1.33 12/21/2011    P:   IVF to 1/2 NS to avoid hyperchloremia. Monitor chemistries and renal function 6/7 resolved  GASTROINTESTINAL  Lab 12/19/11 0725  AST 76*  ALT 42  ALKPHOS 87  BILITOT 0.4  PROT 7.8  ALBUMIN 4.3    A:  Post laparotomy P:   Mgmt per CCS. Ice chips.  HEMATOLOGIC  Lab 12/23/11 0430 12/22/11 0500 12/21/11 0400 12/20/11 0510 12/19/11 2241 12/19/11 2035 12/19/11 0725  HGB 11.2* 11.3* 12.4* 14.7 15.7 -- --  HCT 35.5* 35.7* 40.1 45.3 48.2 -- --  PLT 221 174 168 235 -- -- 261  INR -- -- -- -- -- 1.13 --  APTT -- -- -- -- -- -- --   A:  Hemoconcentration P:  Volume resuscitate. Monitor CBC.  INFECTIOUS  Lab 12/23/11 0430 12/22/11 0500 12/21/11 0830 12/21/11 0400 12/20/11 0510 12/19/11 1838 12/19/11 0725  WBC 11.9* 14.1* -- 17.1* 19.1* -- 20.8*  PROCALCITON -- -- 9.15 -- -- 3.63 --   Cultures: 6/6 bc x 2>>NTD 6/6 uc>>NTD 6/6 sputum>>NTD  Antibiotics: Ertapenem 6/4 >>   A:  Severe sepsis, Peritonitis due to bowel perf P:   Cont Envanz. 6/6 pan  cultured  ENDOCRINE  Lab 12/19/11 2302  GLUCAP 148*   A:  No active issues P:   SSI if glu > 180.  NEUROLOGIC  A:  No active issues, intact P:   Sedation/analgesia as ordered.  Transfer out of SDU, PCCM will sign off, please call back if needed.  Rush Farmer, M.D. [facility] Pulmonary/Critical Care Medicine. Pager: [phone]. After hours pager: [phone].

## 2011-12-24 LAB — GLUCOSE, CAPILLARY
Glucose-Capillary: 104 mg/dL — ABNORMAL HIGH (ref 70–99)
Glucose-Capillary: 101 mg/dL — ABNORMAL HIGH (ref 70–99)
Glucose-Capillary: 97 mg/dL (ref 70–99)

## 2011-12-24 LAB — CBC
HCT: 37.5 % — ABNORMAL LOW (ref 39.0–52.0)
Hemoglobin: 11.7 g/dL — ABNORMAL LOW (ref 13.0–17.0)
MCH: 29.3 pg (ref 26.0–34.0)
MCHC: 31.2 g/dL (ref 30.0–36.0)
MCV: 94 fL (ref 78.0–100.0)
RDW: 14.6 % (ref 11.5–15.5)

## 2011-12-24 LAB — BASIC METABOLIC PANEL
BUN: 19 mg/dL (ref 6–23)
Calcium: 7.9 mg/dL — ABNORMAL LOW (ref 8.4–10.5)
Creatinine, Ser: 1.04 mg/dL (ref 0.50–1.35)
GFR calc Af Amer: 90 mL/min — ABNORMAL LOW (ref >90)
GFR calc non Af Amer: 77 mL/min — ABNORMAL LOW (ref >90)
Glucose, Bld: 106 mg/dL — ABNORMAL HIGH (ref 70–99)
Potassium: 3.6 mEq/L (ref 3.5–5.1)

## 2011-12-24 LAB — CULTURE, BAL-QUANTITATIVE W GRAM STAIN

## 2011-12-24 NOTE — Progress Notes (Signed)
Patient ID: [name], male   DOB: 04-04-1951, 61 y.o.   MRN: [id number] 5 Days Post-Op  Subjective: Feels better, walking in hall. + flatus, minimal NG drainage  Objective: Vital signs in last 24 hours: Temp:  [98 F (36.7 C)-98.2 F (36.8 C)] 98.2 F (36.8 C) (06/09 0437) Pulse Rate:  [60-71] 71  (06/09 0437) Resp:  [16-18] 18  (06/09 0437) BP: (141-151)/(71-84) 146/77 mmHg (06/09 0437) SpO2:  [92 %-98 %] 92 % (06/09 0437) Last BM Date:  (pta)  Intake/Output from previous day: 06/08 0701 - 06/09 0700 In: 3238.3 [P.O.:60; I.V.:3178.3] Out: 2025 [Urine:1425; Emesis/NG output:600] Intake/Output this shift: Total I/O In: 0  Out: 650 [Urine:600; Emesis/NG output:50]  General appearance: alert and no distress GI: normal findings: soft, non-tender VAC in place, clean  Lab Results:   [facility] 12/24/11 0514 12/23/11 0430  WBC 11.7* 11.9*  HGB 11.7* 11.2*  HCT 37.5* 35.5*  PLT 213 221   BMET  Basename 12/24/11 0514 12/23/11 0430  NA 146* 149*  K 3.6 4.3  CL 113* 115*  CO2 26 25  GLUCOSE 106* 107*  BUN 19 21  CREATININE 1.04 1.03  CALCIUM 7.9* 7.7*     Studies/Results: Dg Chest Port 1 View  12/23/2011  *RADIOLOGY REPORT*  Clinical Data: Extubation.  Asthma.  PORTABLE CHEST - 1 VIEW  Comparison: 12/22/2011 and 12/21/2011.  Findings: 0551 hours.  Nasogastric tube remains in place. Endotracheal tube and left IJ central venous catheter have been removed.  There is stable bibasilar atelectasis.  No pneumothorax or significant pleural effusion is seen.  Heart size is stable.  IMPRESSION: Stable bibasilar atelectasis.  Original Report Authenticated By: Vivia Ewing, M.D.    Anti-infectives: Anti-infectives     Start     Dose/Rate Route Frequency Ordered Stop   12/19/11 1100   ertapenem (INVANZ) 1 g in sodium chloride 0.9 % 50 mL IVPB        1 g 100 mL/hr over 30 Minutes Intravenous Every 24 hours 12/19/11 1039            Assessment/Plan: s/p  Procedure(s): EXPLORATORY LAPAROTOMY SMALL BOWEL RESECTION Doing well. Will clamp NG.  Cont abx   LOS: 5 days    Arris Meyn T 12/24/2011

## 2011-12-24 NOTE — Progress Notes (Signed)
12/24/11 1300  PT Visit Information  Last PT Received On 12/24/11  Reason Eval/Treat Not Completed Other (comment) (up w/ nursing/family)

## 2011-12-25 LAB — GLUCOSE, CAPILLARY: Glucose-Capillary: 89 mg/dL (ref 70–99)

## 2011-12-25 MED ORDER — FUROSEMIDE 10 MG/ML IJ SOLN
20.0000 mg | Freq: Once | INTRAMUSCULAR | Status: AC
  Administered 2011-12-25: 20 mg via INTRAVENOUS
  Filled 2011-12-25: qty 2

## 2011-12-25 MED ORDER — ALBUTEROL SULFATE HFA 108 (90 BASE) MCG/ACT IN AERS
1.0000 | INHALATION_SPRAY | RESPIRATORY_TRACT | Status: DC | PRN
  Filled 2011-12-25: qty 6.7

## 2011-12-25 NOTE — Consult Note (Signed)
WOC consult Note Reason for Consult:NPWT dressing change Wound type:Surgical Pressure Ulcer POA: Yes Wound XEN:[id number] Drainage (amount, consistency, odor) Small amount of serous drainage in tubing Periwound:intact Dressing procedure/placement/frequency: NPWT dressing changed using 1 piece of white foam and 1 piece of black foam.  Seal immediately achieved and maintained with no distress expressed from patient.  160mHg continuous pressure. Next dressing change on Wednesday, 12/27/11. WOC will follow.  Please re-consult if needed before next visit. Thanks, LMaudie Flakes MSN, RN, [facility] [place]([phone]

## 2011-12-25 NOTE — Progress Notes (Signed)
6 Days Post-Op  Subjective: NG clamped yesterday, he's had some nausea, but not much.  He's still NPO, had dressing changed earlier, some coverage  over mesh, but [name] the wound nurse isn't sure what she saw was good granulation.  We will look at it together Wed.  Objective: Vital signs in last 24 hours: Temp:  [98.4 F (36.9 C)-98.9 F (37.2 C)] 98.4 F (36.9 C) (06/10 0923) Pulse Rate:  [69-85] 85  (06/10 0624) Resp:  [18] 18  (06/10 0624) BP: (147-181)/(77-95) 162/95 mmHg (06/10 0624) SpO2:  [90 %-93 %] 92 % (06/10 0624) Last BM Date:  (pta)  Intake/Output from previous day: 06/09 0701 - 06/10 0700 In: 220 [P.O.:120; IV Piggyback:100] Out: 5720 [Urine:5650; Emesis/NG output:70] Intake/Output this shift: Total I/O In: -  Out: 900 [Urine:900]  General appearance: alert, cooperative and no distress Resp: clear to auscultation bilaterally and few rales both bases. GI: abnormal findings:  soft, not distended, few BS, sore but not tender. +flatus, and Wound vac in place.  Lab Results:   Basename 12/24/11 0514 12/23/11 0430  WBC 11.7* 11.9*  HGB 11.7* 11.2*  HCT 37.5* 35.5*  PLT 213 221    BMET  Basename 12/24/11 0514 12/23/11 0430  NA 146* 149*  K 3.6 4.3  CL 113* 115*  CO2 26 25  GLUCOSE 106* 107*  BUN 19 21  CREATININE 1.04 1.03  CALCIUM 7.9* 7.7*   PT/INR No results found for this basename: LABPROT:2,INR:2 in the last 72 hours   Lab 12/19/11 0725  AST 76*  ALT 42  ALKPHOS 87  BILITOT 0.4  PROT 7.8  ALBUMIN 4.3     Lipase     Component Value Date/Time   LIPASE 34 12/19/2011 0725     Studies/Results: No results found.  Medications:    . antiseptic oral rinse  15 mL Mouth Rinse QID  . chlorhexidine  15 mL Mouth Rinse BID  . ertapenem (INVANZ) IV  1 g Intravenous Q24H  . furosemide  20 mg Intravenous Once  . heparin subcutaneous  5,000 Units Subcutaneous Q8H  . pantoprazole (PROTONIX) IV  40 mg Intravenous QHS    Assessment/Plan 1.  Incarcerated ventral hernia with acute abdomen. Perforated, Incarcerated hernia Ventral Hernia, S/P EXPLORATORY, Small Bowel resection with repair of hernia with biologic mesh 12/19/11. Dr. Excell Seltzer. SIRS/Acute lung Injury/post op VDRF Severe Peritonitis 2. History of partial colectomy, ileostomy, peritonitis with open wound 2005 at [facility].  3. Ileostomy reversal 2006  4. Ventral hernia repair with the ultra Pro mesh 02/10/2005 Dr. Johnathan Hausen  5. Hiatal hernia  6. Dyslipidemia  7. History of asthma  8. Psoriasis  9. History of atypical chest pain with normal stress test 2008   Plan:  I'm going start him on some sips, while the NG is in and see if he gets sick,  If he does we can just hook him up.  If not we can pull tube later.recheck labs tomorrow.    LOS: 6 days    [name] 12/25/2011

## 2011-12-25 NOTE — Progress Notes (Signed)
Pt has developed expiratory wheezes. He doesn't appear to be in distress. VS are: BR162/78, HR81, RR18, SpO2 92 on RA. Called dr. Johney Maine; got orders for: Albuterol Q4hPRN, 64m Lasix IV once, D/C KCl infusion. I will assess pt after admin of lasix and record vitals.

## 2011-12-25 NOTE — Progress Notes (Signed)
General Surgery [facility] - Attending  Patient seen and examined.  NG clamped.  Tolerating clear liquid diet.  VAC changed today by [facility] nurse.  Earnstine Regal, MD, [facility] Office: [phone]

## 2011-12-25 NOTE — Evaluation (Signed)
Physical Therapy Evaluation Patient Details Name: [name] MRN: [id number] DOB: 1951/06/07 Today's Date: 12/25/2011 Time: 1478-2956 PT Time Calculation (min): 24 min  PT Assessment / Plan / Recommendation Clinical Impression  61 y.o. s/p small bowel resection with open abdominal wound. 9/10 pain with ambulation, pt was premedicated. Pt ambulated 300' with RW with flexed trunk. Likely pt can return home with HHPT, may need RW depending on progress. Pt would benefit from acute PT to maximize safety and independence with mobility.    PT Assessment  Patient needs continued PT services    Follow Up Recommendations  Home health PT    Barriers to Discharge None      lEquipment Recommendations  Rolling walker with 5" wheels    Recommendations for Other Services OT consult   Frequency Min 3X/week    Precautions / Restrictions Restrictions Weight Bearing Restrictions: No   Pertinent Vitals/Pain *9/10 abdominal pain with walking Pain meds requested**      Mobility  Bed Mobility Bed Mobility: Rolling Left;Left Sidelying to Sit Rolling Left: 3: Mod assist Left Sidelying to Sit: 3: Mod assist;With rails;HOB elevated Details for Bed Mobility Assistance: pt 65*, assist to elevate trunk, HOB 30* Transfers Transfers: Sit to Stand;Stand to Sit Sit to Stand: 4: Min guard;From bed;With upper extremity assist Stand to Sit: 4: Min guard;To chair/3-in-1;With upper extremity assist Ambulation/Gait Ambulation/Gait Assistance: 4: Min guard Ambulation Distance (Feet): 300 Feet Assistive device: Rolling walker Ambulation/Gait Assistance Details: VCs for positioning in RW, pt tends to step too far behind RW, VCs for posture, pt unable to stand fully upright due to abdominal pain Gait Pattern: Trunk flexed Stairs: No    Exercises     PT Diagnosis: Difficulty walking;Generalized weakness  PT Problem List: Decreased activity tolerance;Decreased mobility;Decreased knowledge of use of  DME;Pain PT Treatment Interventions: Gait training;DME instruction;Functional mobility training;Therapeutic activities;Patient/family education   PT Goals Acute Rehab PT Goals PT Goal Formulation: With patient Time For Goal Achievement: 01/08/12 Potential to Achieve Goals: Good Pt will go Supine/Side to Sit: with modified independence PT Goal: Supine/Side to Sit - Progress: Goal set today Pt will go Sit to Stand: with modified independence PT Goal: Sit to Stand - Progress: Goal set today Pt will Ambulate: >150 feet;with least restrictive assistive device;with modified independence PT Goal: Ambulate - Progress: Goal set today  Visit Information  Last PT Received On: 12/25/11 Assistance Needed: +1 Reason Eval/Treat Not Completed: Patient refused (pt stated he's waiting for his wound treatment )    Subjective Data      Prior Functioning  Home Living Lives With: Spouse Available Help at Discharge: Family Type of Home: House Home Access: Stairs to enter Technical brewer of Steps: 2 Entrance Stairs-Rails: None Home Layout: Two level (1st floor has 1/2 bath) Alternate Level Stairs-Number of Steps: flight Home Adaptive Equipment: None Prior Function Level of Independence: Independent Able to Take Stairs?: Yes Driving: Yes Vocation: Full time employment Comments: Nurse, mental health for [employer] Communication Communication: No difficulties    Cognition  Overall Cognitive Status: Appears within functional limits for tasks assessed/performed Arousal/Alertness: Awake/alert Orientation Level: Appears intact for tasks assessed Behavior During Session: [facility] for tasks performed    Extremity/Trunk Assessment Right Upper Extremity Assessment RUE ROM/Strength/Tone: [facility] for tasks assessed Left Upper Extremity Assessment LUE ROM/Strength/Tone: WFL for tasks assessed Right Lower Extremity Assessment RLE ROM/Strength/Tone: Within functional levels RLE Sensation: WFL - Light  Touch RLE Coordination: WFL - gross/fine motor Left Lower Extremity Assessment LLE ROM/Strength/Tone: Within functional levels  LLE Sensation: WFL - Light Touch LLE Coordination: WFL - gross/fine motor Trunk Assessment Trunk Assessment: Kyphotic (flexed trunk due to pain with upright position)   Balance Balance Balance Assessed: Yes Static Sitting Balance Static Sitting - Balance Support: Bilateral upper extremity supported;Feet supported Static Sitting - Level of Assistance: 6: Modified independent (Device/Increase time) Static Sitting - Comment/# of Minutes: 4  End of Session PT - End of Session Activity Tolerance: Patient limited by pain Patient left: in chair;with call bell/phone within reach;with family/visitor present Nurse Communication: Mobility status   Blondell Reveal Kistler 12/25/2011, 1:48 PM [phone]

## 2011-12-25 NOTE — Progress Notes (Signed)
Patient has diuresed very well since Lasix admin. Urine output is 3100 mls. His lung sounds are clear, and there is no more wheezing. Patient is resting comfortably.

## 2011-12-25 NOTE — Consult Note (Deleted)
WOC consult Note Reason for Consult: V.A.C. Dressing Change; LLQ Colostomy Pouching system Change (3 retention sutures intact.) Wound type:Surgical with exposed biologic mesh Pressure Ulcer POA: No Wound bed: First evidence of granulation tissue migration over mesh noted at 5 o'clock. Drainage (amount, consistency, odor) scant in cannister (serous) Periwound:intact Dressing procedure/placement/frequency: Modena Jansky, PA present for npwt dressing change. 1 pc white foam over biologic mesh, 3 pcs black foam (2 used beneath retention sutures, 1 over white foam) used.  Seal achieved and 153mHg continuous pressure utilized.  Ostomy pouch changed.  Supplies in room (1 pc white foam, 1 pc medium black foam, 1 ostomy pouch, adhesive remover wipes) for next dressing change due on Wednesday, 12/27/11. I will follow.  Please re-consult if needed before next visit. Thanks, LMaudie Flakes MSN, RN, [facility] [phone]

## 2011-12-25 NOTE — Progress Notes (Signed)
PT Cancellation Note  Treatment cancelled today due to patient's refusal to participate. Pt stated x2 this morning that he wants to wait until after wound treatment is completed to do PT. Will continue to follow.   Blondell Reveal Kistler 12/25/2011, 11:27 AM [phone]

## 2011-12-26 ENCOUNTER — Inpatient Hospital Stay (HOSPITAL_COMMUNITY): Payer: 59

## 2011-12-26 ENCOUNTER — Encounter (HOSPITAL_COMMUNITY): Payer: Self-pay | Admitting: General Surgery

## 2011-12-26 DIAGNOSIS — E46 Unspecified protein-calorie malnutrition: Secondary | ICD-10-CM

## 2011-12-26 DIAGNOSIS — K9189 Other postprocedural complications and disorders of digestive system: Secondary | ICD-10-CM

## 2011-12-26 DIAGNOSIS — K912 Postsurgical malabsorption, not elsewhere classified: Secondary | ICD-10-CM | POA: Diagnosis not present

## 2011-12-26 DIAGNOSIS — K567 Ileus, unspecified: Secondary | ICD-10-CM

## 2011-12-26 HISTORY — DX: Other postprocedural complications and disorders of digestive system: K91.89

## 2011-12-26 HISTORY — DX: Ileus, unspecified: K56.7

## 2011-12-26 LAB — GLUCOSE, CAPILLARY
Glucose-Capillary: 83 mg/dL (ref 70–99)
Glucose-Capillary: 78 mg/dL (ref 70–99)
Glucose-Capillary: 68 mg/dL — ABNORMAL LOW (ref 70–99)
Glucose-Capillary: 71 mg/dL (ref 70–99)

## 2011-12-26 LAB — CULTURE, BLOOD (ROUTINE X 2)
Culture  Setup Time: 201306050817
Culture: NO GROWTH

## 2011-12-26 MED ORDER — PHENOL 1.4 % MT LIQD
1.0000 | OROMUCOSAL | Status: DC | PRN
  Administered 2011-12-26 – 2011-12-30 (×2): 1 via OROMUCOSAL
  Filled 2011-12-26 (×2): qty 177

## 2011-12-26 NOTE — Progress Notes (Addendum)
PT/OT/ST Cancellation Note  ___Treatment cancelled today due to medical issues with patient which prohibited therapy  ___ Treatment cancelled today due to patient receiving procedure or test   ___ Treatment cancelled today due to patient's refusal to participate   _x__ Treatment cancelled today due to pt just ambulated with family/nursing (length of hallway). Pt agreeable to therapy checking back on tomorrow for tx session.    Signature:    Weston Anna, PT [phone]

## 2011-12-26 NOTE — Progress Notes (Signed)
7 Days Post-Op  Subjective: Had some nausea last pm and went back on suction, some flatus, ;but 850 NG output since last PM.    Objective: Vital signs in last 24 hours: Temp:  [98.1 F (36.7 C)-98.2 F (36.8 C)] 98.2 F (36.8 C) (06/11 0521) Pulse Rate:  [73-86] 73  (06/11 0521) Resp:  [18-20] 20  (06/11 0521) BP: (144-161)/(73-89) 144/77 mmHg (06/11 0521) SpO2:  [94 %-95 %] 95 % (06/11 0521) Last BM Date:  (pta)  300 po intake, NG 600 ml last shift, recorded afebrile, VSS, no labs  Intake/Output from previous day: 06/10 0701 - 06/11 0700 In: 960 [P.O.:300; I.V.:650; IV Piggyback:10] Out: 1610 [Urine:2450; Emesis/NG output:600] Intake/Output this shift:    General appearance: alert, cooperative and no distress Resp: clear to auscultation bilaterally GI: soft, tender, few BS, Wound vac in place.  Lab Results:   [facility] 12/24/11 0514  WBC 11.7*  HGB 11.7*  HCT 37.5*  PLT 213    BMET  Basename 12/24/11 0514  NA 146*  K 3.6  CL 113*  CO2 26  GLUCOSE 106*  BUN 19  CREATININE 1.04  CALCIUM 7.9*   PT/INR No results found for this basename: LABPROT:2,INR:2 in the last 72 hours  No results found for this basename: AST:5,ALT:5,ALKPHOS:5,BILITOT:5,PROT:5,ALBUMIN:5 in the last 168 hours   Lipase     Component Value Date/Time   LIPASE 34 12/19/2011 0725     Studies/Results: No results found.  Medications:    . antiseptic oral rinse  15 mL Mouth Rinse QID  . chlorhexidine  15 mL Mouth Rinse BID  . ertapenem (INVANZ) IV  1 g Intravenous Q24H  . heparin subcutaneous  5,000 Units Subcutaneous Q8H  . pantoprazole (PROTONIX) IV  40 mg Intravenous QHS    Assessment/Plan 1. Incarcerated ventral hernia with acute abdomen. Perforated, Incarcerated hernia Ventral Hernia, S/P EXPLORATORY, Small Bowel resection with repair of hernia with biologic mesh 12/19/11. Dr. Excell Seltzer.  SIRS/Acute lung Injury/post op VDRF  Severe Peritonitis  2. History of partial colectomy,  ileostomy, peritonitis with open wound 2005 at [facility].  3. Ileostomy reversal 2006  4. Ventral hernia repair with the ultra Pro mesh 02/10/2005 Dr. Johnathan Hausen  5. Hiatal hernia  6. Dyslipidemia  7. History of asthma  8. Psoriasis  9. History of atypical chest pain with normal stress test 2008 10. Day 8 Invanz   Plan:  Continue NG for now.  Check labs in am.  Increase walking.    LOS: 7 days    [place] surgery attending note:  Patient is interviewed and examined. History and postop progress to date reviewed.  Patient is basically stable, but still has an ileus and is requiring NG suction. Passing some flatus but no stool. Vital signs are stable. Abdomen is soft, minimally and appropriately tender. The wound looks fine. Bowel sounds are essentially absent.  Plan:  Continue NG suction. Check abdominal films tonight. Insert PICC line tomorrow and start TNA per pharmacy protocol. Hopefully his ileus will slowly resolve as his peritonitis resolves.   Edsel Petrin. Dalbert Batman, M.D., [facility] General and Minimally invasive Surgery Breast and Colorectal Surgery Office:   [phone] Pager:   [phone]     12/26/2011

## 2011-12-26 NOTE — Progress Notes (Signed)
Pt ambulated approximately 245f with front wheeled walker with assistance.  Pt tolerated well. dph

## 2011-12-27 ENCOUNTER — Inpatient Hospital Stay (HOSPITAL_COMMUNITY): Payer: 59

## 2011-12-27 LAB — GLUCOSE, CAPILLARY
Glucose-Capillary: 100 mg/dL — ABNORMAL HIGH (ref 70–99)
Glucose-Capillary: 108 mg/dL — ABNORMAL HIGH (ref 70–99)
Glucose-Capillary: 72 mg/dL (ref 70–99)

## 2011-12-27 LAB — PHOSPHORUS: Phosphorus: 2.3 mg/dL (ref 2.3–4.6)

## 2011-12-27 LAB — CBC
Hemoglobin: 12.8 g/dL — ABNORMAL LOW (ref 13.0–17.0)
MCH: 29.4 pg (ref 26.0–34.0)
MCV: 88.3 fL (ref 78.0–100.0)
RBC: 4.36 MIL/uL (ref 4.22–5.81)
WBC: 12.3 10*3/uL — ABNORMAL HIGH (ref 4.0–10.5)

## 2011-12-27 LAB — COMPREHENSIVE METABOLIC PANEL
AST: 24 U/L (ref 0–37)
Albumin: 2.2 g/dL — ABNORMAL LOW (ref 3.5–5.2)
Calcium: 7.7 mg/dL — ABNORMAL LOW (ref 8.4–10.5)
Creatinine, Ser: 0.88 mg/dL (ref 0.50–1.35)
GFR calc non Af Amer: >90 mL/min (ref >90)

## 2011-12-27 LAB — PREALBUMIN: Prealbumin: 8.1 mg/dL — ABNORMAL LOW (ref 17.0–34.0)

## 2011-12-27 MED ORDER — SODIUM CHLORIDE 0.45 % IV SOLN
INTRAVENOUS | Status: AC
  Administered 2011-12-27: 18:00:00 via INTRAVENOUS

## 2011-12-27 MED ORDER — INSULIN ASPART 100 UNIT/ML ~~LOC~~ SOLN
0.0000 [IU] | Freq: Three times a day (TID) | SUBCUTANEOUS | Status: DC
  Administered 2011-12-30: 1 [IU] via SUBCUTANEOUS
  Administered 2011-12-30: 2 [IU] via SUBCUTANEOUS

## 2011-12-27 MED ORDER — FAT EMULSION 20 % IV EMUL
240.0000 mL | INTRAVENOUS | Status: AC
  Administered 2011-12-27: 240 mL via INTRAVENOUS
  Filled 2011-12-27: qty 250

## 2011-12-27 MED ORDER — ZINC TRACE METAL 1 MG/ML IV SOLN
INTRAVENOUS | Status: AC
  Administered 2011-12-27: 19:00:00 via INTRAVENOUS
  Filled 2011-12-27: qty 1000

## 2011-12-27 MED ORDER — SODIUM CHLORIDE 0.9 % IJ SOLN
10.0000 mL | INTRAMUSCULAR | Status: DC | PRN
Start: 2011-12-27 — End: 2012-01-02
  Administered 2011-12-28 – 2012-01-02 (×7): 10 mL

## 2011-12-27 MED ORDER — POTASSIUM CHLORIDE 10 MEQ/100ML IV SOLN
10.0000 meq | INTRAVENOUS | Status: AC
  Administered 2011-12-27 (×6): 10 meq via INTRAVENOUS
  Filled 2011-12-27 (×6): qty 100

## 2011-12-27 NOTE — Progress Notes (Signed)
Pt ambulated to toilet this morning. Pt did not have BM. Pt then encouraged to walk in hall, pt refused at this time, will continue to encourage walking.  Barbee Shropshire. Brigitte Pulse, RN

## 2011-12-27 NOTE — Progress Notes (Signed)
Pt able to tolerate NG tube clamped throughout shift (first clamped 19:50 on 6/11). Pt has no complaints of nausea and is tolerating clear liquids well. Pt given prn nausea medications d/t sensitivity to pain medication. Will continue to monitor and encourage fluids.  [name]. Brigitte Pulse, RN

## 2011-12-27 NOTE — Progress Notes (Signed)
Nutrition Follow-up/tpn consult  Pt with NG tube, clamped.  Prolonged ileus. Diet Order:  Clear liquid- tolerating well  Meds: Scheduled Meds:   . antiseptic oral rinse  15 mL Mouth Rinse QID  . chlorhexidine  15 mL Mouth Rinse BID  . ertapenem (INVANZ) IV  1 g Intravenous Q24H  . heparin subcutaneous  5,000 Units Subcutaneous Q8H  . insulin aspart  0-9 Units Subcutaneous Q8H  . pantoprazole (PROTONIX) IV  40 mg Intravenous QHS  . potassium chloride  10 mEq Intravenous Q1 Hr x 6   Continuous Infusions:   . sodium chloride 75 mL/hr at 12/27/11 1150  . sodium chloride    . fat emulsion    . TPN (CLINIMIX) +/- additives     PRN Meds:.acetaminophen, albuterol, HYDROmorphone (DILAUDID) injection, metoprolol, ondansetron, phenol, promethazine, sodium chloride  Labs:  CMP     Component Value Date/Time   NA 137 12/27/2011 0510   K 2.9* 12/27/2011 0510   CL 102 12/27/2011 0510   CO2 25 12/27/2011 0510   GLUCOSE 99 12/27/2011 0510   BUN 11 12/27/2011 0510   CREATININE 0.88 12/27/2011 0510   CALCIUM 7.7* 12/27/2011 0510   PROT 5.7* 12/27/2011 0510   ALBUMIN 2.2* 12/27/2011 0510   AST 24 12/27/2011 0510   ALT 15 12/27/2011 0510   ALKPHOS 55 12/27/2011 0510   BILITOT 1.1 12/27/2011 0510   GFRNONAA >90 12/27/2011 0510   GFRAA >90 12/27/2011 0510     Intake/Output Summary (Last 24 hours) at 12/27/11 1234 Last data filed at 12/27/11 9373  Gross per 24 hour  Intake    900 ml  Output   3025 ml  Net  -2125 ml    Weight Status:  117 kg 6/7- no new weight  Re-estimated needs:  1900-2100 kcal, 105-115 g protein  Nutrition Dx:  Inadequate oral intake--ongoing  Goal:  TPN to meet >90% estimated needs.  Intervention:  TPN per pharmacy.   TPN:  Clinimix E 5/15 to begin at 40 ml/hr and increase to goal  Of 90 ml/hr plus 20% lipids at 20 ml/hr MWF which will provide a daily average of 1739 kcas, 108 g protein.  Monitor:  tpn with pharmacy, weight, diet advancement, labs   Darrol Jump Pager #:  [phone]

## 2011-12-27 NOTE — Progress Notes (Signed)
PARENTERAL NUTRITION CONSULT NOTE - INITIAL  Pharmacy Consult for TNA Indication: Prolonged ileus  Allergies  Allergen Reactions  . Shellfish Allergy     Throat stated to close    Patient Measurements: Height: 5' 10"  (177.8 cm) Weight: 257 lb 12.8 oz (116.937 kg) IBW/kg (Calculated) : 73  Adjusted Body Weight: 90.6 kg Usual Weight: 110 kg (08/02/11), pt is currently ~ usual weight  Vital Signs: Temp: 99 F (37.2 C) (06/12 0437) Temp src: Oral (06/12 0437) BP: 151/84 mmHg (06/12 0437) Pulse Rate: 85  (06/12 0437) Intake/Output from previous day: 06/11 0701 - 06/12 0700 In: -  Out: 2875 [Urine:1775; Emesis/NG output:1100] Intake/Output from this shift: Total I/O In: 900 [I.V.:900] Out: 450 [Urine:450]  Labs:  [facility] 12/27/11 0510  WBC 12.3*  HGB 12.8*  HCT 38.5*  PLT 276  APTT --  INR --     Basename 12/27/11 0510  NA 137  K 2.9*  CL 102  CO2 25  GLUCOSE 99  BUN 11  CREATININE 0.88  LABCREA --  CREAT24HRUR --  CALCIUM 7.7*  MG 2.1  PHOS 2.3  PROT 5.7*  ALBUMIN 2.2*  AST 24  ALT 15  ALKPHOS 55  BILITOT 1.1  BILIDIR --  IBILI --  PREALBUMIN --  TRIG --  CHOLHDL --  CHOL --   Estimated Creatinine Clearance: 117.3 ml/min (by C-G formula based on Cr of 0.88).    Basename 12/27/11 0758 12/27/11 0416 12/27/11 0023  GLUCAP 108* 87 72    Insulin Requirements in the past 24 hours:  No SSI ordered  Current Nutrition:  CLD starting 6/10, no intake recorded 6/11 IVF: 1/2NS @ 75 ml/hr  Nutritional Goals: (per RD assessment 6/6) Kcal: 1500-1750 (based on ASPEN permissive underfeeding guidelines.)  Protein: 115-125 gm protein  Fluid: >2L  Clinimix E 5/15 at goal rate of 90 ml/hr daily + lipids 10 ml/hr MWF will provide: 108 g/day protein and 2013 Kcal/day MWF, 1533 Kcal/day STTHS (Avg. 1739 Kcal/day weekly).  Assessment: 61 yo M history of partial colectomy, ileostomy, peritonitis with open wound 2005 at [facility], ileostomy reversal in 2006,  ventral hernia repair with the ultra pro mesh 02/10/2005, hiatal hernia. Pt. Now, s/p ex lap/small bowel resection (6/4) for repair of incarcerated recurrent ventrical hernia with bowel obstruction to start TNA per pharmacy following PICC placement 6/12. Labs:   Electrolytes: K+ 2.9, Mag/Phos/other lytes WNL  LFTs: AST/ALT WNL, TP slightly low  Prealbumin: 8.1  Renal function adequate, CrCl >100 ml/min.  Plan:  At 1800 today:  Start Clinimix E 5/15 at 40 ml/hr.  Will advance toward goal rate as tolerated.  Fat emulsion at 10 ml/hr (MWF only due to ongoing shortage).  TNA to contain standard multivitamins and trace elements(MWF only due to ongoing shortage).  Reduce IVF to 45 ml/hr (with TNA, this will achieve pt's fluid goal >2L)  KCl 10 mEq/161m IV x 6 runs.  Add sensitive SSI q8h.  TNA lab panels on Mondays & Thursdays.  F/u daily.  RHershal Coria6/06/2012,11:38 AM

## 2011-12-27 NOTE — Progress Notes (Signed)
Peripherally Inserted Central Catheter/Midline Placement  The IV Nurse has discussed with the patient and/or persons authorized to consent for the patient, the purpose of this procedure and the potential benefits and risks involved with this procedure.  The benefits include less needle sticks, lab draws from the catheter and patient may be discharged home with the catheter.  Risks include, but not limited to, infection, bleeding, blood clot (thrombus formation), and puncture of an artery; nerve damage and irregular heat beat.  Alternatives to this procedure were also discussed.  PICC/Midline Placement Documentation        [name] 12/27/2011, 9:27 AM

## 2011-12-27 NOTE — Consult Note (Signed)
Wound care follow up:  Follow up for wound vac dressing change for Full thickness surgical incision.  CCS PA at bedside for dressing change.   Wound bed:  Wound bed moist with 95% granulation tissue visible, 5% mesh showing to center of wound.  Measurements as follows: 14.5 x 7.0 x 3.0 cm.  Deepest aspect of wound to center with small amount of mesh being visible.  Periwound intact, pinkish drainage noted in chamber of moderate amount.  No odor noted.  Dressing procedure/placement/frequency: One piece of white foam, 2 piece of black foam used for dressing change.  Suction at 100 mmHG without leaks.  Tolerated dressing change well.  Plan for next dressing change on Friday December 29, 2011.  New wound vac unit obtained for use, previous unit with critical battery alarm with unit unplugged.  Family states, "It alarms every time the unit is unplugged."    Corporate investment banker, BSN, WOC Nurse/Will not plan to follow further unless re-consulted.  [address] [place], MSN, [place]

## 2011-12-27 NOTE — Progress Notes (Signed)
8 Days Post-Op  Subjective: Actually feels better, had a low sugar last PM and got allot of Apple juice, and didn't have a problem with that.  NG is clamped now.  Objective: Vital signs in last 24 hours: Temp:  [97.8 F (36.6 C)-99 F (37.2 C)] 99 F (37.2 C) (06/12 0437) Pulse Rate:  [81-85] 85  (06/12 0437) Resp:  [18-20] 20  (06/12 0437) BP: (151-158)/(80-84) 151/84 mmHg (06/12 0437) SpO2:  [95 %-96 %] 96 % (06/12 0437) Last BM Date:  (pta)  1100 ml/NG yesterday, afebrile, VSS, K+ 2.9, WBC, 12.3, films OK, atelectasis both lungs, ? Left effusion  Intake/Output from previous day: 06/11 0701 - 06/12 0700 In: -  Out: 2875 [Urine:1775; Emesis/NG output:1100] Intake/Output this shift: Total I/O In: 900 [I.V.:900] Out: 450 [Urine:450]  General appearance: alert, cooperative and no distress Resp: rales each base. GI: soft, vac in place, few BS, more flatus.  Lab Results:   [facility] 12/27/11 0510  WBC 12.3*  HGB 12.8*  HCT 38.5*  PLT 276    BMET  Basename 12/27/11 0510  NA 137  K 2.9*  CL 102  CO2 25  GLUCOSE 99  BUN 11  CREATININE 0.88  CALCIUM 7.7*   PT/INR No results found for this basename: LABPROT:2,INR:2 in the last 72 hours   Lab 12/27/11 0510  AST 24  ALT 15  ALKPHOS 55  BILITOT 1.1  PROT 5.7*  ALBUMIN 2.2*     Lipase     Component Value Date/Time   LIPASE 34 12/19/2011 0725     Studies/Results: Dg Chest Port 1 View  12/27/2011  *RADIOLOGY REPORT*  Clinical Data: Right PICC line placement.  PORTABLE CHEST - 1 VIEW  Comparison: 12/23/2011  Findings: A right-sided PICC line is present with tip projecting over the lower SVC.  No pneumothorax observed.  Nasogastric tube extends into the stomach.  Low lung volumes are present, causing crowding of the pulmonary vasculature.  Stable slightly indistinct linear opacities in the left perihilar region and right lung base favor atelectasis.  There is mild blunting of the left costophrenic angle.   IMPRESSION:  1.  Right-sided PICC line tip:  Lower SVC. 2.  Stable suspected atelectasis in the left mid lung and right lung base. 3.  Mild blunting of the left lateral costophrenic angle - small pleural effusion cannot be excluded.  Original Report Authenticated By: Carron Curie, M.D.   Dg Abd 2 Views  12/26/2011  *RADIOLOGY REPORT*  Clinical Data: Ileus.  Postop for abdominal surgery/hernia 1 week ago.  ABDOMEN - 2 VIEW  Comparison: Plain film chest 12/23/2011.  Abdominal films of 12/19/2011.  CT of 12/19/2011  Findings: Necklace artifact.  Patient rotated to the right.  Reverse apical lordotic positioning. Normal heart size for level of inspiration.  Possible small left pleural effusion.  Limited evaluation for pneumothorax.  Given differences in technique, similar bibasilar atelectasis.  Only the upper abdomen included on the upright film. No free intraperitoneal air.  Nasogastric terminates at body of the stomach.  Gas within the ascending colon.  Surgical clips in the left lower quadrant.  Mild nonspecific paucity of left-sided abdominal bowel gas.  No pneumatosis.  IMPRESSION: 1.  Mild nonspecific paucity of left-sided bowel gas.  Otherwise, no evidence of bowel obstruction or free intraperitoneal air.  2.  Similar bibasilar atelectasis.  Possible small left pleural effusion.  Original Report Authenticated By: Areta Haber, M.D.    Medications:    . antiseptic oral  rinse  15 mL Mouth Rinse QID  . chlorhexidine  15 mL Mouth Rinse BID  . ertapenem (INVANZ) IV  1 g Intravenous Q24H  . heparin subcutaneous  5,000 Units Subcutaneous Q8H  . pantoprazole (PROTONIX) IV  40 mg Intravenous QHS    Assessment/Plan 1. Incarcerated ventral hernia with acute abdomen. Perforated, Incarcerated hernia Ventral Hernia, S/P EXPLORATORY, Small Bowel resection with repair of hernia with biologic mesh 12/19/11. Dr. Excell Seltzer.  SIRS/Acute lung Injury/post op VDRF  Severe Peritonitis  2. History of partial  colectomy, ileostomy, peritonitis with open wound 2005 at [facility].  3. Ileostomy reversal 2006  4. Ventral hernia repair with the ultra Pro mesh 02/10/2005 Dr. Johnathan Hausen  5. Hiatal hernia  6. Dyslipidemia  7. History of asthma  8. Psoriasis  9. History of atypical chest pain with normal stress test 2008  10. 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  Low K+  Plan:  Replace K+, MG+ is normal. Check wound with Vac change today.  He's doing 2000 ml on IS, and walking.  He's getting TNA later today. Leave NG clamped and leave on clears.     LOS: 8 days    [name] 12/27/2011

## 2011-12-27 NOTE — Progress Notes (Signed)
General Surgery [facility] - Attending  Agree.  VAC changed.  Hopefully NG out tomorrow.  Earnstine Regal, MD, [facility] Office: [phone]

## 2011-12-28 LAB — COMPREHENSIVE METABOLIC PANEL
BUN: 9 mg/dL (ref 6–23)
Calcium: 7.9 mg/dL — ABNORMAL LOW (ref 8.4–10.5)
GFR calc Af Amer: >90 mL/min (ref >90)
Glucose, Bld: 121 mg/dL — ABNORMAL HIGH (ref 70–99)
Sodium: 138 mEq/L (ref 135–145)
Total Protein: 5.9 g/dL — ABNORMAL LOW (ref 6.0–8.3)

## 2011-12-28 LAB — GLUCOSE, CAPILLARY: Glucose-Capillary: 114 mg/dL — ABNORMAL HIGH (ref 70–99)

## 2011-12-28 LAB — CULTURE, BLOOD (ROUTINE X 2)
Culture  Setup Time: 201306070138
Culture: NO GROWTH

## 2011-12-28 LAB — MAGNESIUM: Magnesium: 2.3 mg/dL (ref 1.5–2.5)

## 2011-12-28 MED ORDER — POTASSIUM CHLORIDE 10 MEQ/100ML IV SOLN
10.0000 meq | INTRAVENOUS | Status: AC
  Administered 2011-12-28 (×6): 10 meq via INTRAVENOUS
  Filled 2011-12-28 (×6): qty 100

## 2011-12-28 MED ORDER — SODIUM CHLORIDE 0.45 % IV SOLN: INTRAVENOUS | Status: DC

## 2011-12-28 MED ORDER — BACITRACIN-NEOMYCIN-POLYMYXIN 400-5-5000 EX OINT
TOPICAL_OINTMENT | Freq: Three times a day (TID) | CUTANEOUS | Status: DC
  Administered 2011-12-28 (×2): 1 via TOPICAL
  Administered 2011-12-29: 22:00:00 via TOPICAL
  Administered 2011-12-29: 1 via TOPICAL
  Administered 2011-12-29: 17:00:00 via TOPICAL
  Administered 2011-12-30 – 2012-01-01 (×7): 1 via TOPICAL
  Administered 2012-01-01: 22:00:00 via TOPICAL
  Administered 2012-01-01 – 2012-01-02 (×2): 1 via TOPICAL

## 2011-12-28 MED ORDER — CLINIMIX E/DEXTROSE (5/15) 5 % IV SOLN
INTRAVENOUS | Status: AC
  Administered 2011-12-28: 17:00:00 via INTRAVENOUS
  Filled 2011-12-28: qty 2000

## 2011-12-28 NOTE — Progress Notes (Signed)
[name] NOTE   Pharmacy Consult for TNA Indication: Prolonged ileus  Allergies  Allergen Reactions  . Shellfish Allergy     Throat stated to close    Patient Measurements: Height: 5' 10"  (177.8 cm) Weight: 257 lb 12.8 oz (116.937 kg) IBW/kg (Calculated) : 73  Adjusted Body Weight: 90.6 kg Usual Weight: 110 kg (08/02/11), pt is currently ~ usual weight  Vital Signs: Temp: 98.4 F (36.9 C) (06/13 0709) Temp src: Oral (06/13 0709) BP: 145/68 mmHg (06/13 0709) Pulse Rate: 81  (06/13 0709) Intake/Output from previous day: 06/12 0701 - 06/13 0700 In: 2368.8 [P.O.:180; I.V.:1638.8; IV Piggyback:550] Out: 2825 [Urine:2825]  Labs:  [facility] 12/27/11 0510  WBC 12.3*  HGB 12.8*  HCT 38.5*  PLT 276  APTT --  INR --    Basename 12/28/11 0350 12/27/11 0510  NA 138 137  K 3.0* 2.9*  CL 102 102  CO2 26 25  GLUCOSE 121* 99  BUN 9 11  CREATININE 0.88 0.88  LABCREA -- --  CREAT24HRUR -- --  CALCIUM 7.9* 7.7*  MG 2.3 2.1  PHOS 2.4 2.3  PROT 5.9* 5.7*  ALBUMIN 2.3* 2.2*  AST 25 24  ALT 16 15  ALKPHOS 56 55  BILITOT 0.8 1.1  BILIDIR -- --  IBILI -- --  PREALBUMIN -- 8.1*  TRIG -- --  CHOLHDL -- --  CHOL -- --   Estimated Creatinine Clearance: 117.3 ml/min (by C-G formula based on Cr of 0.88).    Basename 12/27/11 2337 12/27/11 1622 12/27/11 1132  GLUCAP 100* 81 98    Insulin Requirements in the past 24 hours:  No SSI used; CBG range 81-108  Current Nutrition:  CLD starting 6/10; only 137m PO intake recorded for 6/12 IVF: 1/2NS @ 45 ml/hr  Nutritional Goals:  Per RD assessment 6/12 Kcal: 1900-2100, Protein: 105-115 gm protein, Fluid: >2L  Clinimix E 5/15 at goal rate of 95 ml/hr daily + lipids 10 ml/hr MWF will provide: an average of 1836 Kcal/day weekly, and 114 g/day protein  Assessment: 61yo M history of partial colectomy, ileostomy, [facility], ventral hernia repair with  the ultra pro mesh 02/10/2005, hiatal hernia. Pt. Now, s/p ex lap/small bowel resection (6/4) for repair of incarcerated recurrent ventrical hernia with bowel obstruction to start TNA per pharmacy following PICC placement 6/12. Labs:   Electrolytes: K+ 3, Mag/Phos/other lytes WNL  LFTs: AST/ALT WNL, TP slightly low  Prealbumin: 8.1 (6/12)  Renal function adequate, CrCl >100 ml/min.  Plan:  At 1800 today:  Advance Clinimix E 5/15 to 83 ml/hr.  Will advance toward goal rate as tolerated.  Fat emulsion at 10 ml/hr (MWF only due to ongoing shortage).  TNA to contain standard multivitamins and trace elements(MWF only due to ongoing shortage).  [facility](with T[facility], pt's fluid goal >2L is achieved)  KCl 10 mEq/1061mIV x 6 runs.  Continue sensitive SSI q8h.  TNA lab panels on Mondays & Thursdays.  F/u daily.   ChGretta ArabharmD, BCPS Pager [phone]/13/2013 7:46 AM

## 2011-12-28 NOTE — Progress Notes (Signed)
General Surgery [facility] - Attending  Patient in good spirits.  Tolerating CL diet today.  No nausea.  No BM.  Continue on clears for now.  Ambulating in halls.  Neosporin oint to nares for NG tube injury/ulcer.  Earnstine Regal, MD, [facility] Office: [phone]

## 2011-12-28 NOTE — Progress Notes (Signed)
Patient ID: [name], male   DOB: 02/04/1951, 61 y.o.   MRN: [id number] 9 Days Post-Op  Subjective:  Doing well today, has tolerated NGT clamped and clear liquids, pain well controlled.  Passing lots of gas  Objective: Vital signs in last 24 hours: Temp:  [98.2 F (36.8 C)-98.4 F (36.9 C)] 98.4 F (36.9 C) (06/13 0709) Pulse Rate:  [78-84] 81  (06/13 0709) Resp:  [18] 18  (06/13 0709) BP: (144-154)/(68-85) 145/68 mmHg (06/13 0709) SpO2:  [95 %-96 %] 95 % (06/13 0709) Last BM Date:  (pta)   Intake/Output from previous day: 06/12 0701 - 06/13 0700 In: 2368.8 [P.O.:180; I.V.:1638.8; IV Piggyback:550] Out: 2825 [Urine:2825] Intake/Output this shift: Total I/O In: 1178.6 [I.V.:567.8; TPN:610.8] Out: 750 [Urine:750]  General appearance: alert, cooperative and no distress Resp: coarse BS in bases GI: soft, vac in place, +BS  Lab Results:   [facility] 12/27/11 0510  WBC 12.3*  HGB 12.8*  HCT 38.5*  PLT 276    BMET  Basename 12/28/11 0350 12/27/11 0510  NA 138 137  K 3.0* 2.9*  CL 102 102  CO2 26 25  GLUCOSE 121* 99  BUN 9 11  CREATININE 0.88 0.88  CALCIUM 7.9* 7.7*   PT/INR No results found for this basename: LABPROT:2,INR:2 in the last 72 hours   Lab 12/28/11 0350 12/27/11 0510  AST 25 24  ALT 16 15  ALKPHOS 56 55  BILITOT 0.8 1.1  PROT 5.9* 5.7*  ALBUMIN 2.3* 2.2*     Lipase     Component Value Date/Time   LIPASE 34 12/19/2011 0725     Studies/Results: Dg Chest Port 1 View  12/27/2011  *RADIOLOGY REPORT*  Clinical Data: Right PICC line placement.  PORTABLE CHEST - 1 VIEW  Comparison: 12/23/2011  Findings: A right-sided PICC line is present with tip projecting over the lower SVC.  No pneumothorax observed.  Nasogastric tube extends into the stomach.  Low lung volumes are present, causing crowding of the pulmonary vasculature.  Stable slightly indistinct linear opacities in the left perihilar region and right lung base favor atelectasis.  There is  mild blunting of the left costophrenic angle.  IMPRESSION:  1.  Right-sided PICC line tip:  Lower SVC. 2.  Stable suspected atelectasis in the left mid lung and right lung base. 3.  Mild blunting of the left lateral costophrenic angle - small pleural effusion cannot be excluded.  Original Report Authenticated By: Carron Curie, M.D.   Dg Abd 2 Views  12/26/2011  *RADIOLOGY REPORT*  Clinical Data: Ileus.  Postop for abdominal surgery/hernia 1 week ago.  ABDOMEN - 2 VIEW  Comparison: Plain film chest 12/23/2011.  Abdominal films of 12/19/2011.  CT of 12/19/2011  Findings: Necklace artifact.  Patient rotated to the right.  Reverse apical lordotic positioning. Normal heart size for level of inspiration.  Possible small left pleural effusion.  Limited evaluation for pneumothorax.  Given differences in technique, similar bibasilar atelectasis.  Only the upper abdomen included on the upright film. No free intraperitoneal air.  Nasogastric terminates at body of the stomach.  Gas within the ascending colon.  Surgical clips in the left lower quadrant.  Mild nonspecific paucity of left-sided abdominal bowel gas.  No pneumatosis.  IMPRESSION: 1.  Mild nonspecific paucity of left-sided bowel gas.  Otherwise, no evidence of bowel obstruction or free intraperitoneal air.  2.  Similar bibasilar atelectasis.  Possible small left pleural effusion.  Original Report Authenticated By: Areta Haber, M.D.  Medications:    . antiseptic oral rinse  15 mL Mouth Rinse QID  . chlorhexidine  15 mL Mouth Rinse BID  . ertapenem (INVANZ) IV  1 g Intravenous Q24H  . heparin subcutaneous  5,000 Units Subcutaneous Q8H  . insulin aspart  0-9 Units Subcutaneous Q8H  . pantoprazole (PROTONIX) IV  40 mg Intravenous QHS  . potassium chloride  10 mEq Intravenous Q1 Hr x 6  . potassium chloride  10 mEq Intravenous Q1 Hr x 6    Assessment/Plan 1. Incarcerated ventral hernia with acute abdomen. Perforated, Incarcerated hernia  Ventral Hernia, S/P EXPLORATORY, Small Bowel resection with repair of hernia with biologic mesh 12/19/11. Dr. Excell Seltzer.  SIRS/Acute lung Injury/post op VDRF  Severe Peritonitis  2. History of partial colectomy, ileostomy, peritonitis with open wound 2005 at [facility].  3. Ileostomy reversal 2006  4. Ventral hernia repair with the ultra Pro mesh 02/10/2005 Dr. Johnathan Hausen  5. Hiatal hernia  6. Dyslipidemia  7. History of asthma  8. Psoriasis  9. History of atypical chest pain with normal stress test 2008  10. 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  Low K+  Plan:  Continue K+ replacement K 3.0 today, Continue wound VAC changes MWF.  Continues to walk and pain well controlled.  Started TNA yesterday.  No nausea with NGT clamped and clears.  Will d/c NGT today.  If tolerates clear liquids at lunch then may advance to Full liquid at dinner.       LOS: 9 days    [name] 12/28/2011

## 2011-12-28 NOTE — Progress Notes (Signed)
Physical Therapy Treatment Patient Details Name: [name] MRN: [id number] DOB: 06-08-1951 Today's Date: 12/28/2011 Time: 4098-1191 PT Time Calculation (min): 15 min  PT Assessment / Plan / Recommendation Comments on Treatment Session  pt motivated to ambulate, trunk remains flexed. pt staops at times to attempt to straighten up    Follow Up Recommendations  Home health PT    Barriers to Discharge        Equipment Recommendations  Rolling walker with 5" wheels    Recommendations for Other Services    Frequency Min 3X/week   Plan Discharge plan remains appropriate;Frequency remains appropriate    Precautions / Restrictions Precautions Precautions: None   Pertinent Vitals/Pain Pain 7/10    Mobility  Bed Mobility Bed Mobility: Sit to Supine Sit to Supine: 7: Independent Details for Bed Mobility Assistance: pt required no assist. Transfers Transfers: Sit to Stand;Stand to Sit Sit to Stand: 4: Min guard;From chair/3-in-1;With upper extremity assist Stand to Sit: 5: Supervision;With upper extremity assist;To bed Ambulation/Gait Ambulation/Gait Assistance: 4: Min guard Ambulation Distance (Feet): 400 Feet Assistive device: Rolling walker Gait Pattern: Trunk flexed    Exercises     PT Diagnosis:    PT Problem List:   PT Treatment Interventions:     PT Goals Acute Rehab PT Goals Pt will go Sit to Stand: with modified independence PT Goal: Sit to Stand - Progress: Progressing toward goal Pt will Ambulate: >150 feet;with least restrictive assistive device;with modified independence PT Goal: Ambulate - Progress: Progressing toward goal  Visit Information  Last PT Received On: 12/28/11 Assistance Needed: +1    Subjective Data  Subjective: I walk about 4 times a day.   Cognition       Balance     End of Session PT - End of Session Activity Tolerance: Patient tolerated treatment well Patient left: in bed;with call bell/phone within reach    Claretha Cooper 12/28/2011, 4:36 PM [phone]

## 2011-12-29 LAB — GLUCOSE, CAPILLARY
Glucose-Capillary: 104 mg/dL — ABNORMAL HIGH (ref 70–99)
Glucose-Capillary: 109 mg/dL — ABNORMAL HIGH (ref 70–99)

## 2011-12-29 MED ORDER — BISACODYL 10 MG RE SUPP
10.0000 mg | Freq: Once | RECTAL | Status: AC
  Administered 2011-12-29: 10 mg via RECTAL
  Filled 2011-12-29: qty 1

## 2011-12-29 MED ORDER — FAT EMULSION 20 % IV EMUL
240.0000 mL | INTRAVENOUS | Status: DC
  Administered 2011-12-29: 240 mL via INTRAVENOUS
  Filled 2011-12-29: qty 250

## 2011-12-29 MED ORDER — ZINC TRACE METAL 1 MG/ML IV SOLN
INTRAVENOUS | Status: DC
  Administered 2011-12-29: 17:00:00 via INTRAVENOUS
  Filled 2011-12-29: qty 2280

## 2011-12-29 NOTE — Progress Notes (Signed)
10 Days Post-Op  Subjective: Doing well on liquids, no BM.  He just found out one of his Nephews was killed in an MVA.  He's upset about that this AM  Objective: Vital signs in last 24 hours: Temp:  [98.1 F (36.7 C)-98.6 F (37 C)] 98.1 F (36.7 C) (06/14 0537) Pulse Rate:  [83-86] 83  (06/14 0537) Resp:  [18] 18  (06/14 0537) BP: (130-143)/(72-79) 130/73 mmHg (06/14 0537) SpO2:  [94 %-98 %] 95 % (06/14 0537) Last BM Date:  (pta)  Intake/Output from previous day: 06/13 0701 - 06/14 0700 In: 2933.2 [P.O.:240; I.V.:567.8; TPN:2125.5] Out: 5250 [Urine:5250] Intake/Output this shift: Total I/O In: 120 [P.O.:120] Out: -   General appearance: alert, cooperative and no distress Resp: clear to auscultation bilaterally GI: SOFT, +BS, tender.  NO BM so far.  Abdominal would redressed.  He still has an area in the midline almost the center where you can open and see mesh.  the remainder of the wound is OK and doing nicely.  Some erythema skin edges of wound, may be from adhesive on wound vac.  Lab Results:   [facility] 12/27/11 0510  WBC 12.3*  HGB 12.8*  HCT 38.5*  PLT 276    BMET  Basename 12/28/11 0350 12/27/11 0510  NA 138 137  K 3.0* 2.9*  CL 102 102  CO2 26 25  GLUCOSE 121* 99  BUN 9 11  CREATININE 0.88 0.88  CALCIUM 7.9* 7.7*   PT/INR No results found for this basename: LABPROT:2,INR:2 in the last 72 hours   Lab 12/28/11 0350 12/27/11 0510  AST 25 24  ALT 16 15  ALKPHOS 56 55  BILITOT 0.8 1.1  PROT 5.9* 5.7*  ALBUMIN 2.3* 2.2*     Lipase     Component Value Date/Time   LIPASE 34 12/19/2011 0725     Studies/Results: Dg Chest Port 1 View  12/27/2011  *RADIOLOGY REPORT*  Clinical Data: Right PICC line placement.  PORTABLE CHEST - 1 VIEW  Comparison: 12/23/2011  Findings: A right-sided PICC line is present with tip projecting over the lower SVC.  No pneumothorax observed.  Nasogastric tube extends into the stomach.  Low lung volumes are present, causing  crowding of the pulmonary vasculature.  Stable slightly indistinct linear opacities in the left perihilar region and right lung base favor atelectasis.  There is mild blunting of the left costophrenic angle.  IMPRESSION:  1.  Right-sided PICC line tip:  Lower SVC. 2.  Stable suspected atelectasis in the left mid lung and right lung base. 3.  Mild blunting of the left lateral costophrenic angle - small pleural effusion cannot be excluded.  Original Report Authenticated By: Carron Curie, M.D.    Medications:    . antiseptic oral rinse  15 mL Mouth Rinse QID  . chlorhexidine  15 mL Mouth Rinse BID  . ertapenem (INVANZ) IV  1 g Intravenous Q24H  . heparin subcutaneous  5,000 Units Subcutaneous Q8H  . insulin aspart  0-9 Units Subcutaneous Q8H  . neomycin-bacitracin-polymyxin   Topical TID  . pantoprazole (PROTONIX) IV  40 mg Intravenous QHS  . potassium chloride  10 mEq Intravenous Q1 Hr x 6    Assessment/Plan 1. Incarcerated ventral hernia with acute abdomen. Perforated, Incarcerated hernia Ventral Hernia, S/P EXPLORATORY, Small Bowel resection with repair of hernia with biologic mesh 12/19/11. Dr. Excell Seltzer.  SIRS/Acute lung Injury/post op VDRF  Severe Peritonitis  2. History of partial colectomy, ileostomy, peritonitis with open wound 2005 at  [facility].  3. Ileostomy reversal 2006  4. Ventral hernia repair with the ultra Pro mesh 02/10/2005 Dr. Johnathan Hausen  5. Hiatal hernia  6. Dyslipidemia  7. History of asthma  8. Psoriasis  9. History of atypical chest pain with normal stress test 2008  10. 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 Low K+   Plan:  Recheck labs in AM.  He got 6 runs of K+ yesterday and is on TNA now.  Try a dulcolax, if no success, ?reglan tomorrow.     LOS: 10 days    Sakai Wolford 12/29/2011

## 2011-12-29 NOTE — Consult Note (Signed)
WOC consult Note WOC Nurse follow up visit for NPWT (V.A.C.) change.  PA Will Creig Hines present for wound assessment and dressing change. Wound granulating and filling in; small section of wound in center (measuring 1cm x .4cm x 1.5cm ) is covered by a thin layer of tissue which is easily disrupted to reveal true wound bed.  Small piece of white foam inserted into this defect to prevent premature closure.  Additional pieces of white foam placed over wound; this is followed by the placement of black foam and secured with drape.  A seal is immediately achieved when attached to suction (146mHg) and patient tolerated procedure well.  101mg continuous pressure continued. WOKwethlukeam will follow..  Please re-consult if needed before next visit. Thanks, LaMaudie FlakesMSN, RN, GNAscension Eagle River Mem [id number]

## 2011-12-29 NOTE — Progress Notes (Signed)
General Surgery [facility] - Attending  Multiple BM's this afternoon.  Continue CL's for now, TNA.  Likely will advance diet tomorrow if clinically progressing.  Wife at bedside.  VAC changed today by [facility] nurse.  Earnstine Regal, MD, [facility] Office: [phone]

## 2011-12-29 NOTE — Progress Notes (Signed)
Talked to patient about DCP; [facility] list provided to the patient, patient chose [facility] for home health care needs. Ria Comment RN with [facility] called for arrangements. Attending MD please write for [place] orders- HHRN/ PT; B Tamera Punt RN, BSN, [place]

## 2011-12-29 NOTE — Progress Notes (Signed)
[place] NOTE   Pharmacy Consult for TNA Indication: Prolonged ileus  Allergies  Allergen Reactions  . Shellfish Allergy     Throat stated to close    Patient Measurements: Height: 5' 10"  (177.8 cm) Weight: 257 lb 12.8 oz (116.937 kg) IBW/kg (Calculated) : 73  Adjusted Body Weight: 90.6 kg Usual Weight: 110 kg (08/02/11), pt is currently ~ usual weight  Vital Signs: Temp: 98.1 F (36.7 C) (06/14 0537) Temp src: Oral (06/14 0537) BP: 130/73 mmHg (06/14 0537) Pulse Rate: 83  (06/14 0537) Intake/Output from previous day: 06/13 0701 - 06/14 0700 In: 2933.2 [P.O.:240; I.V.:567.8; TPN:2125.5] Out: 5250 [Urine:5250]  Labs:  [facility] 12/27/11 0510  WBC 12.3*  HGB 12.8*  HCT 38.5*  PLT 276  APTT --  INR --    Basename 12/28/11 0350 12/27/11 0510  NA 138 137  K 3.0* 2.9*  CL 102 102  CO2 26 25  GLUCOSE 121* 99  BUN 9 11  CREATININE 0.88 0.88  LABCREA -- --  CREAT24HRUR -- --  CALCIUM 7.9* 7.7*  MG 2.3 2.1  PHOS 2.4 2.3  PROT 5.9* 5.7*  ALBUMIN 2.3* 2.2*  AST 25 24  ALT 16 15  ALKPHOS 56 55  BILITOT 0.8 1.1  BILIDIR -- --  IBILI -- --  PREALBUMIN -- 8.1*  TRIG -- --  CHOLHDL -- --  CHOL -- --   Estimated Creatinine Clearance: 117.3 ml/min (by C-G formula based on Cr of 0.88).    Basename 12/29/11 0743 12/29/11 0005 12/28/11 1648  GLUCAP 115* 115* 88    Insulin Requirements in the past 24 hours:  No SSI used; CBG range 88-115  Current Nutrition:  CLD (started 6/10) advanced to FLD (6/14 pm); 244m PO intake recorded for 6/13 IVF: 1/2NS @ KVO Clinimix 5/15 @ 83 ml/hr  Nutritional Goals:  Per RD assessment 6/12:  Kcal 1900-2100, Protein: 105-115 gm protein, Fluid: >2L Clinimix E 5/15 at goal rate of 95 ml/hr daily + lipids 10 ml/hr MWF will provide: an average of 1836 Kcal/day weekly, and 114 g/day protein  Assessment: 61yo M history of partial colectomy, ileostomy, [facility]  [facility], ventral hernia repair with the ultra pro mesh 02/10/2005, hiatal hernia. Pt. Now, s/p ex lap/small bowel resection (6/4) for repair of incarcerated recurrent ventrical hernia with bowel obstruction to start TNA per pharmacy following PICC placement 6/12. Labs:   Electrolytes: K+ 3, Mag/Phos/other lytes WNL (6/13), no new labs 6/14  LFTs: AST/ALT WNL, TP slightly low  Prealbumin: 8.1 (6/12)  Renal function adequate, CrCl >100 ml/min.  Plan:  At 1800 today:  Advance Clinimix E 5/15 to goal rate of 95 ml/hr.   Fat emulsion at 10 ml/hr (MWF only due to ongoing shortage).  TNA to contain standard multivitamins and trace elements(MWF only due to ongoing shortage).  [facility], pt's fluid goal >2L is achieved)  Continue sensitive SSI q8h.  TNA lab panels on Mondays & Thursdays.  F/u plans for PO diet.  If tolerating FLD, consider weaning TNA.   CGretta ArabPharmD, BCPS Pager [phone]/14/2013 8:36 AM

## 2011-12-30 LAB — BASIC METABOLIC PANEL
CO2: 24 mEq/L (ref 19–32)
Chloride: 103 mEq/L (ref 96–112)
Potassium: 3.8 mEq/L (ref 3.5–5.1)
Sodium: 134 mEq/L — ABNORMAL LOW (ref 135–145)

## 2011-12-30 LAB — GLUCOSE, CAPILLARY: Glucose-Capillary: 122 mg/dL — ABNORMAL HIGH (ref 70–99)

## 2011-12-30 LAB — CBC
Platelets: 344 10*3/uL (ref 150–400)
RBC: 4.56 MIL/uL (ref 4.22–5.81)
WBC: 14.5 10*3/uL — ABNORMAL HIGH (ref 4.0–10.5)

## 2011-12-30 LAB — MAGNESIUM: Magnesium: 2.3 mg/dL (ref 1.5–2.5)

## 2011-12-30 MED ORDER — CLINIMIX E/DEXTROSE (5/15) 5 % IV SOLN
INTRAVENOUS | Status: DC
  Filled 2011-12-30: qty 2280

## 2011-12-30 MED ORDER — HYDROCODONE-ACETAMINOPHEN 5-325 MG PO TABS
1.0000 | ORAL_TABLET | ORAL | Status: DC | PRN
  Administered 2011-12-31 – 2012-01-02 (×14): 2 via ORAL
  Filled 2011-12-30 (×14): qty 2

## 2011-12-30 NOTE — Progress Notes (Signed)
Patient ID: [name], male   DOB: 01/07/1951, 61 y.o.   MRN: [id number]  [facility] - Progress Note  POD# 11  Subjective: Patient awake, no complaints.  Wife at bedside.  Two BM's this AM.  No nausea.  Tolerating clear liquids.  Objective: Vital signs in last 24 hours: Temp:  [97.3 F (36.3 C)-98.3 F (36.8 C)] 98.3 F (36.8 C) (06/15 0448) Pulse Rate:  [76-90] 76  (06/15 0448) Resp:  [20-22] 22  (06/15 0448) BP: (124-167)/(71-99) 124/71 mmHg (06/15 0448) SpO2:  [91 %-99 %] 91 % (06/15 0448) Last BM Date:  (pta)  Intake/Output from previous day: 06/14 0701 - 06/15 0700 In: 1303 [P.O.:240; I.V.:180; IV Piggyback:100; TPN:783] Out: 2240 [Urine:2240]  Exam: HEENT - clear, not icteric Neck - soft Chest - clear bilaterally Cor - RRR, no murmur Abd - soft, mild distension; VAC intact; active BS present Ext - no significant edema Neuro - grossly intact, no focal deficits  Lab Results:   Basename 12/30/11 0525  WBC 14.5*  HGB 13.4  HCT 40.8  PLT 344     Basename 12/30/11 0525 12/28/11 0350  NA 134* 138  K 3.8 3.0*  CL 103 102  CO2 24 26  GLUCOSE 124* 121*  BUN 12 9  CREATININE 0.81 0.88  CALCIUM 8.3* 7.9*    Studies/Results: No results found.  Assessment / Plan: 1.  Status post VH repair  - resolving ileus - advance diet  - discontinue TNA  - OOB, ambulate  - VAC to be changed on Monday  Earnstine Regal, MD, [facility] Office: [phone]  12/30/2011

## 2011-12-30 NOTE — Progress Notes (Addendum)
[name] NOTE   Pharmacy Consult for TNA Indication: Prolonged ileus  Allergies  Allergen Reactions  . Shellfish Allergy     Throat stated to close    Patient Measurements: Height: 5' 10"  (177.8 cm) Weight: 257 lb 12.8 oz (116.937 kg) IBW/kg (Calculated) : 73  Adjusted Body Weight: 90.6 kg Usual Weight: 110 kg (08/02/11), pt is currently ~ usual weight  Vital Signs: Temp: 98.3 F (36.8 C) (06/15 0448) Temp src: Oral (06/15 0448) BP: 124/71 mmHg (06/15 0448) Pulse Rate: 76  (06/15 0448) Intake/Output from previous day: 06/14 0701 - 06/15 0700 In: 8638 [P.O.:240; I.V.:180; IV Piggyback:100; TPN:783] Out: 2240 [Urine:2240]  Labs:  [facility] 12/30/11 0525  WBC 14.5*  HGB 13.4  HCT 40.8  PLT 344  APTT --  INR --    Basename 12/30/11 0525 12/28/11 0350  NA 134* 138  K 3.8 3.0*  CL 103 102  CO2 24 26  GLUCOSE 124* 121*  BUN 12 9  CREATININE 0.81 0.88  LABCREA -- --  CREAT24HRUR -- --  CALCIUM 8.3* 7.9*  MG 2.3 2.3  PHOS -- 2.4  PROT -- 5.9*  ALBUMIN -- 2.3*  AST -- 25  ALT -- 16  ALKPHOS -- 56  BILITOT -- 0.8  BILIDIR -- --  IBILI -- --  PREALBUMIN -- --  TRIG -- --  CHOLHDL -- --  CHOL -- --   Estimated Creatinine Clearance: 127.4 ml/min (by C-G formula based on Cr of 0.81).    Basename 12/29/11 2102 12/29/11 1610 12/29/11 1144  GLUCAP 109* 104* 95    Insulin Requirements in the past 24 hours:  2 units SSI used; CBG range 95-115  Current Nutrition:  Advanced to FLD 6/13, 245m PO intake recorded for 6/14 IVF: 1/2NS @ KVO Clinimix 5/15 @ 95 ml/hr  Nutritional Goals:  Per RD assessment 6/12:  Kcal 1900-2100, Protein: 105-115 gm protein, Fluid: >2L Clinimix E 5/15 at goal rate of 95 ml/hr daily + lipids 10 ml/hr MWF will provide: an average of 1836 Kcal/day weekly, and 114 g/day protein  Assessment: 61yo M history of partial colectomy, ileostomy, [facility], ventral  hernia repair with the ultra pro mesh 02/10/2005, hiatal hernia. Pt. Now, s/p ex lap/small bowel resection (6/4) for repair of incarcerated recurrent ventrical hernia with bowel obstruction to start TNA per pharmacy following PICC placement 6/12. Labs:   Electrolytes: K+ 3.8 after replacement, Mag/Phos WNL, Na 134 (cannot be adjusted with TNA).  LFTs: AST/ALT WNL, TP slightly low but improved  Prealbumin: 8.1 (6/12)  Renal function adequate, CrCl >100 ml/min.  Plan:  At 1800 today:  Continue Clinimix E 5/15 to goal rate of 95 ml/hr.   Fat emulsion at 10 ml/hr (MWF only due to ongoing shortage).  TNA to contain standard multivitamins and trace elements(MWF only due to ongoing shortage).  [facility], pt's fluid goal >2L is achieved)  Continue sensitive SSI q8h.  TNA lab panels on Mondays & Thursdays.  F/u plans for PO diet.  If tolerating FLD, consider weaning TNA.   AHershal Coria PharmD, BCPS Pager: [phone]/15/2013 7:49 AM

## 2011-12-31 MED ORDER — ALTEPLASE 2 MG IJ SOLR
2.0000 mg | Freq: Once | INTRAMUSCULAR | Status: DC
  Filled 2011-12-31: qty 2

## 2011-12-31 MED ORDER — PANTOPRAZOLE SODIUM 40 MG PO TBEC
40.0000 mg | DELAYED_RELEASE_TABLET | Freq: Every day | ORAL | Status: DC
  Administered 2011-12-31 – 2012-01-01 (×2): 40 mg via ORAL
  Filled 2011-12-31 (×3): qty 1

## 2011-12-31 NOTE — Progress Notes (Signed)
This patient is receiving IV Protonix. Based on criteria approved by the Pharmacy and Therapeutics Committee, this medication is being converted to the equivalent oral dose form. These criteria include: . The patient is eating (either orally or per tube) and/or has been taking other orally administered medications for at least 24 hours. (840 mL PO intake recorded 6/15) . This patient has no evidence of active gastrointestinal bleeding or impaired GI absorption (gastrectomy, short bowel, patient on TNA (d/c'd 6/15) or NPO).  If you have questions about this conversion, please contact the pharmacy department. Thank you.  Hershal Coria, PharmD, BCPS Pager: [phone] 12/31/2011 12:53 PM

## 2011-12-31 NOTE — Progress Notes (Signed)
Patient ID: [name], male   DOB: [birth year], 61 y.o.   MRN: [id number] [facility] Progress Note:   12 Days Post-Op  Subjective: Mental status is clear.  I performed surgery on him in 2006.  Feeling much better today. Objective: Vital signs in last 24 hours: Temp:  [97.7 F (36.5 C)-98.4 F (36.9 C)] 97.7 F (36.5 C) (06/16 0346) Pulse Rate:  [82-90] 89  (06/16 0346) Resp:  [20] 20  (06/16 0346) BP: (119-126)/(74-78) 126/78 mmHg (06/16 0346) SpO2:  [95 %-99 %] 95 % (06/16 0346)  Intake/Output from previous day: 06/15 0701 - 06/16 0700 In: 1080 [P.O.:840; I.V.:240] Out: 2325 [Urine:2025; Drains:300] Intake/Output this shift: Total I/O In: 130 [P.O.:120; I.V.:10] Out: 250 [Urine:250]  Physical Exam: Work of breathing is  Normal.  VAC in place.  Eating solids OK so far.   Lab Results:  Results for orders placed during the hospital encounter of 12/19/11 (from the past 48 hour(s))  GLUCOSE, CAPILLARY     Status: Abnormal   Collection Time   12/29/11  4:10 PM      Component Value Range Comment   Glucose-Capillary 104 (*) 70 - 99 mg/dL    Comment 1 Documented in Chart      Comment 2 Notify RN     GLUCOSE, CAPILLARY     Status: Abnormal   Collection Time   12/29/11  9:02 PM      Component Value Range Comment   Glucose-Capillary 109 (*) 70 - 99 mg/dL    Comment 1 Notify RN      Comment 2 Documented in Chart     CBC     Status: Abnormal   Collection Time   12/30/11  5:25 AM      Component Value Range Comment   WBC 14.5 (*) 4.0 - 10.5 K/uL    RBC 4.56  4.22 - 5.81 MIL/uL    Hemoglobin 13.4  13.0 - 17.0 g/dL    HCT 40.8  39.0 - 52.0 %    MCV 89.5  78.0 - 100.0 fL    MCH 29.4  26.0 - 34.0 pg    MCHC 32.8  30.0 - 36.0 g/dL    RDW 13.8  11.5 - 15.5 %    Platelets 344  150 - 400 K/uL   BASIC METABOLIC PANEL     Status: Abnormal   Collection Time   12/30/11  5:25 AM      Component Value Range Comment   Sodium 134 (*) 135 - 145 mEq/L    Potassium 3.8  3.5 - 5.1  mEq/L    Chloride 103  96 - 112 mEq/L    CO2 24  19 - 32 mEq/L    Glucose, Bld 124 (*) 70 - 99 mg/dL    BUN 12  6 - 23 mg/dL    Creatinine, Ser 0.81  0.50 - 1.35 mg/dL    Calcium 8.3 (*) 8.4 - 10.5 mg/dL    GFR calc non Af Amer >90  >90 mL/min    GFR calc Af Amer >90  >90 mL/min   MAGNESIUM     Status: Normal   Collection Time   12/30/11  5:25 AM      Component Value Range Comment   Magnesium 2.3  1.5 - 2.5 mg/dL   GLUCOSE, CAPILLARY     Status: Abnormal   Collection Time   12/30/11  8:20 AM      Component Value Range Comment   Glucose-Capillary  122 (*) 70 - 99 mg/dL     Radiology/Results: No results found.  Anti-infectives: Anti-infectives     Start     Dose/Rate Route Frequency Ordered Stop   12/19/11 1100   ertapenem (INVANZ) 1 g in sodium chloride 0.9 % 50 mL IVPB        1 g 100 mL/hr over 30 Minutes Intravenous Every 24 hours 12/19/11 1039            Assessment/Plan: Problem List: Patient Active Problem List  Diagnosis  . HERPES SIMPLEX INFECTION  . ADHD  . Acute Sinusitis, Unspecified  . VIRAL URI  . ACUTE BRONCHITIS  . ASTHMA  . GERD  . GASTROENTERITIS  . BENIGN PROSTATIC HYPERTROPHY  . ACUTE PROSTATITIS  . EPIDIDYMITIS  . MYALGIA  . CHEST PAIN  . PELVIC PAIN, CHRONIC  . DIVERTICULITIS, HX OF  . Small bowel perforation  . Respiratory failure following trauma and surgery  . Status post laparotomy  . Sinus tachycardia  . Peritonitis  . Severe sepsis  . Ileus following gastrointestinal surgery  . Malnutrition following gastrointestinal surgery    Doing well.  Hopeful discharge soon  12 Days Post-Op    LOS: 12 days   Matt B. Hassell Done, MD, [facility] [phone] beeper [phone]  12/31/2011 12:40 PM

## 2011-12-31 NOTE — Progress Notes (Signed)
Physical Therapy Treatment Patient Details Name: [name] MRN: [id number] DOB: 1950/08/10 Today's Date: 12/31/2011 Time: 1747-1595 PT Time Calculation (min): 11 min  PT Assessment / Plan / Recommendation Comments on Treatment Session  Pt is progressing well with mobility. Pt expressed desire to do core strengthening exercises. Encouraged pt to discuss with surgeon when he can start core exercises. Instructed pt in isometric glut sets. Improved posture noted today with walking.    Follow Up Recommendations  Home health PT    Barriers to Discharge        Equipment Recommendations  Rolling walker with 5" wheels    Recommendations for Other Services    Frequency Min 3X/week   Plan Discharge plan remains appropriate;Frequency remains appropriate    Precautions / Restrictions Precautions Precautions: None Restrictions Weight Bearing Restrictions: No   Pertinent Vitals/Pain *no c/o pain**    Mobility  Bed Mobility Bed Mobility: Sit to Supine Details for Bed Mobility Assistance: pt required no assist. Transfers Transfers: Sit to Stand;Stand to Sit Sit to Stand: From chair/3-in-1;With upper extremity assist;6: Modified independent (Device/Increase time) Stand to Sit: With upper extremity assist;6: Modified independent (Device/Increase time);To chair/3-in-1 Ambulation/Gait Ambulation/Gait Assistance: 5: Supervision Ambulation Distance (Feet): 400 Feet Assistive device: Rolling walker General Gait Details: Improved posture with walking, pt progressing well with ambulation    Exercises     PT Diagnosis:    PT Problem List:   PT Treatment Interventions:     PT Goals Acute Rehab PT Goals PT Goal Formulation: With patient/family Time For Goal Achievement: 01/03/12 Potential to Achieve Goals: Good Pt will go Sit to Stand: with modified independence PT Goal: Sit to Stand - Progress: Met Pt will Ambulate: >150 feet;with least restrictive assistive device;with modified  independence PT Goal: Ambulate - Progress: Progressing toward goal  Visit Information  Last PT Received On: 12/31/11 Assistance Needed: +1    Subjective Data  Subjective: I'm feeling better, I got the NG tube out! Patient Stated Goal: to strengthen core muscles   Cognition  Overall Cognitive Status: Appears within functional limits for tasks assessed/performed Arousal/Alertness: Awake/alert Orientation Level: Appears intact for tasks assessed Behavior During Session: [facility] for tasks performed    Balance     End of Session PT - End of Session Activity Tolerance: Patient tolerated treatment well Patient left: in bed;with call bell/phone within reach;with family/visitor present    Philomena Doheny 12/31/2011, 2:21 PM [phone]

## 2012-01-01 LAB — CBC
HCT: 40.6 % (ref 39.0–52.0)
MCV: 90.6 fL (ref 78.0–100.0)
RBC: 4.48 MIL/uL (ref 4.22–5.81)
WBC: 9.7 10*3/uL (ref 4.0–10.5)

## 2012-01-01 LAB — COMPREHENSIVE METABOLIC PANEL
AST: 51 U/L — ABNORMAL HIGH (ref 0–37)
Alkaline Phosphatase: 80 U/L (ref 39–117)
BUN: 12 mg/dL (ref 6–23)
CO2: 25 mEq/L (ref 19–32)
Chloride: 101 mEq/L (ref 96–112)
Creatinine, Ser: 1.05 mg/dL (ref 0.50–1.35)
GFR calc non Af Amer: 76 mL/min — ABNORMAL LOW (ref >90)
Total Bilirubin: 0.4 mg/dL (ref 0.3–1.2)

## 2012-01-01 MED ORDER — POLYETHYLENE GLYCOL 3350 17 G PO PACK
17.0000 g | PACK | Freq: Every day | ORAL | Status: DC
  Administered 2012-01-01: 17 g via ORAL
  Filled 2012-01-01 (×2): qty 1

## 2012-01-01 NOTE — Discharge Instructions (Signed)
[facility]      [facility] [phone]  OPEN ABDOMINAL SURGERY: POST OP INSTRUCTIONS  Always review your discharge instruction sheet given to you by the facility where your surgery was performed.  IF YOU HAVE DISABILITY OR FAMILY LEAVE FORMS, YOU MUST BRING THEM TO THE OFFICE FOR PROCESSING.  PLEASE DO NOT GIVE THEM TO YOUR DOCTOR.  1. A prescription for pain medication may be given to you upon discharge.  Take your pain medication as prescribed, if needed.  If narcotic pain medicine is not needed, then you may take acetaminophen (Tylenol) or ibuprofen (Advil) as needed. 2. Take your usually prescribed medications unless otherwise directed. 3. If you need a refill on your pain medication, please contact your pharmacy. They will contact our office to request authorization.  Prescriptions will not be filled after 5pm or on week-ends. 4. You should follow a light diet the first few days after arrival home, such as soup and crackers, pudding, etc.unless your doctor has advised otherwise. A high-fiber, low fat diet can be resumed as tolerated.   Be sure to include lots of fluids daily. Most patients will experience some swelling and bruising on the chest and neck area.  Ice packs will help.  Swelling and bruising can take several days to resolve 5. Most patients will experience some swelling and bruising in the area of the incision. Ice pack will help. Swelling and bruising can take several days to resolve..  6. It is common to experience some constipation if taking pain medication after surgery.  Increasing fluid intake and taking a stool softener will usually help or prevent this problem from occurring.  A mild laxative (Milk of Magnesia or Miralax) should be taken according to package directions if there are no bowel movements after 48 hours. 7.  You may have steri-strips (small skin tapes) in place directly over the incision.  These strips should be left on the skin for 7-10 days.  If your  surgeon used skin glue on the incision, you may shower in 24 hours.  The glue will flake off over the next 2-3 weeks.  Any sutures or staples will be removed at the office during your follow-up visit. You may find that a light gauze bandage over your incision may keep your staples from being rubbed or pulled. You may shower and replace the bandage daily. 8. ACTIVITIES:  You may resume regular (light) daily activities beginning the next day--such as daily self-care, walking, climbing stairs--gradually increasing activities as tolerated.  You may have sexual intercourse when it is comfortable.  Refrain from any heavy lifting or straining until approved by your doctor. a. You may drive when you no longer are taking prescription pain medication, you can comfortably wear a seatbelt, and you can safely maneuver your car and apply brakes b. Return to Work: ___________________________________ 9. You should see your doctor in the office for a follow-up appointment approximately two weeks after your surgery.  Make sure that you call for this appointment within a day or two after you arrive home to insure a convenient appointment time. OTHER INSTRUCTIONS:  _____________________________________________________________ _____________________________________________________________  WHEN TO CALL YOUR DOCTOR: 1. Fever over 101.0 2. Inability to urinate 3. Nausea and/or vomiting 4. Extreme swelling or bruising 5. Continued bleeding from incision. 6. Increased pain, redness, or drainage from the incision. 7. Difficulty swallowing or breathing 8. Muscle cramping or spasms. 9. Numbness or tingling in hands or feet or around lips.  The clinic staff is available to   answer your questions during regular business hours.  Please don't hesitate to call and ask to speak to one of the nurses if you have concerns.  For further questions, please visit [email or web address]   

## 2012-01-01 NOTE — Progress Notes (Signed)
Physical Therapy Treatment  PT D/C'd due to goals met  Patient Details Name: [name] MRN: [id number] DOB: [birth year] Today's Date: 01/01/2012 Time: 0110-0349 PT Time Calculation (min): 18 min  PT Assessment / Plan / Recommendation Comments on Treatment Session  pt progressing well    Follow Up Recommendations  Home health PT    Barriers to Discharge        Equipment Recommendations  None recommended by PT    Recommendations for Other Services    Frequency Other (Comment)   Plan Discharge plan remains appropriate;Frequency remains appropriate    Precautions / Restrictions Precautions Precautions: None   Pertinent Vitals/Pain C/o minimal pain, pt states " not bad"    Mobility  Bed Mobility Bed Mobility: Sit to Supine Sit to Supine: 7: Independent Transfers Transfers: Sit to Stand;Stand to Sit Sit to Stand: 7: Independent Stand to Sit: 7: Independent Ambulation/Gait Ambulation/Gait Assistance: 5: Supervision;7: Independent Ambulation Distance (Feet): 400 Feet Assistive device: None Ambulation/Gait Assistance Details: initial supervision for safety, pt with no LOB during gait General Gait Details: improved posture/trunk extension when not using RW; had pt step in and out of shower stall x 2 to simulate stairs, able to do with supervision( unable to perform actual stairs due to [facility] and IV lines) Stairs:  (pt states he feels comfortable)    Exercises     PT Diagnosis:    PT Problem List:   PT Treatment Interventions:     PT Goals Acute Rehab PT Goals Time For Goal Achievement: 01/03/12 Potential to Achieve Goals: Good Pt will go Supine/Side to Sit: with modified independence PT Goal: Supine/Side to Sit - Progress: Met Pt will go Sit to Stand: with modified independence PT Goal: Sit to Stand - Progress: Met Pt will Ambulate: >150 feet;with least restrictive assistive device;with modified independence PT Goal: Ambulate - Progress: Met  Visit Information  Last PT Received On: 01/01/12 Assistance Needed: +1    Subjective Data  Subjective: i am stronger today than yesterday   Cognition  Overall Cognitive Status: Appears within functional limits for tasks assessed/performed Arousal/Alertness: Awake/alert Orientation Level: Appears intact for tasks assessed Behavior During Session: [facility] for tasks performed    Balance     End of Session PT - End of Session Activity Tolerance: Patient tolerated treatment well Patient left: in bed;with call bell/phone within reach Nurse Communication: Other (comment) (pt OM to amb with nsg staff and/or family)    [facility] 01/01/2012, 4:06 PM

## 2012-01-01 NOTE — Progress Notes (Signed)
Information faxed to [facility] for wound VAC, pt's acc with [id number] [id number]. Pt plan to discharge with [place] for [facility]. mp

## 2012-01-01 NOTE — Progress Notes (Signed)
13 Days Post-Op  Subjective: Feels pretty good, on a regular diet, he's off TNA,  No BM yesterday, had some dumping syndrome preop and after previous surgery.  Objective: Vital signs in last 24 hours: Temp:  [97.5 F (36.4 C)-97.9 F (36.6 C)] 97.5 F (36.4 C) (06/17 0547) Pulse Rate:  [73-87] 73  (06/17 0547) Resp:  [18-20] 18  (06/17 0547) BP: (107-131)/(65-77) 122/76 mmHg (06/17 0547) SpO2:  [95 %-96 %] 95 % (06/17 0547) Last BM Date: 12/30/11  1440 recorded PO, no stool yesterday. Afebrile, VSS, no labs, 9 days of Invanz.  Will stop today   Intake/Output from previous day: 06/16 0701 - 06/17 0700 In: 1700 [P.O.:1440; I.V.:180; IV Piggyback:80] Out: 1100 [Urine:1100] Intake/Output this shift: Total I/O In: 120 [P.O.:120] Out: 250 [Urine:250]  General appearance: alert, cooperative and no distress Resp: clear to auscultation bilaterally GI: soft, stil tender, Wound vac in place. +BS, tolerating regular diet.  Lab Results:   [facility] 12/30/11 0525  WBC 14.5*  HGB 13.4  HCT 40.8  PLT 344    BMET  Basename 12/30/11 0525  NA 134*  K 3.8  CL 103  CO2 24  GLUCOSE 124*  BUN 12  CREATININE 0.81  CALCIUM 8.3*   PT/INR No results found for this basename: LABPROT:2,INR:2 in the last 72 hours   Lab 12/28/11 0350 12/27/11 0510  AST 25 24  ALT 16 15  ALKPHOS 56 55  BILITOT 0.8 1.1  PROT 5.9* 5.7*  ALBUMIN 2.3* 2.2*     Lipase     Component Value Date/Time   LIPASE 34 12/19/2011 0725     Studies/Results: No results found.  Medications:    . alteplase  2 mg Intracatheter Once  . antiseptic oral rinse  15 mL Mouth Rinse QID  . chlorhexidine  15 mL Mouth Rinse BID  . ertapenem (INVANZ) IV  1 g Intravenous Q24H  . heparin subcutaneous  5,000 Units Subcutaneous Q8H  . neomycin-bacitracin-polymyxin   Topical TID  . pantoprazole  40 mg Oral QHS  . DISCONTD: pantoprazole (PROTONIX) IV  40 mg Intravenous QHS    Assessment/Plan 1. Incarcerated ventral  hernia with acute abdomen. Perforated, Incarcerated hernia Ventral Hernia, S/P EXPLORATORY, Small Bowel resection with repair of hernia with biologic mesh 12/19/11. Dr. Excell Seltzer.  SIRS/Acute lung Injury/post op VDRF  Severe Peritonitis  2. History of partial colectomy, ileostomy, peritonitis with open wound 2005 at [facility].  3. Ileostomy reversal 2006  4. Ventral hernia repair with the ultra Pro mesh 02/10/2005 Dr. Johnathan Hausen  5. Hiatal hernia  6. Dyslipidemia  7. History of asthma  8. Psoriasis  9. History of atypical chest pain with normal stress test 2008  10. Day 9 Invanz  11.PCM/TNA discontinued.   Plan:  Stop antibiotics he's had 9 day.  Check labs, check abdominal wound with Vac change.  Give him some Miralax and discuss when to let him go home.      LOS: 13 days    Callahan,Justin 01/01/2012   He looks good.  Progressing well.  Home soon, likely with home wound vac

## 2012-01-01 NOTE — Discharge Summary (Signed)
Physician Discharge Summary  Patient ID: [name] MRN: [id number] DOB/AGE: 1951/03/02 61 y.o.  Admit date: 12/19/2011 Discharge date: 01/02/2012  Admission Diagnoses: 1. Incarcerated ventral hernia with acute abdomen.  2. History of partial colectomy, ileostomy, peritonitis with open wound 2005 at [facility].  3. Ileostomy reversal 2006  4. Ventral hernia repair with the ultra Pro mesh 02/10/2005 Dr. Johnathan Hausen  5. Hiatal hernia  6. Dyslipidemia  7. History of asthma  8. Psoriasis  9. History of atypical chest pain with normal stress test 2008   Discharge Diagnoses: . Incarcerated ventral hernia with acute abdomen. Perforated, Incarcerated hernia Ventral Hernia Severe Peritonitis/Post OP ileus 2. History of partial colectomy, ileostomy, peritonitis with open wound 2005 at [facility].  3. Ileostomy reversal 2006  4. Ventral hernia repair with the ultra Pro mesh 02/10/2005 Dr. Johnathan Hausen  5. Hiatal hernia  6. Dyslipidemia 7. History of asthma  8. Psoriasis  9. 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 Dr. Excell Seltzer.   Hospital Course: Is a 61 year old gentleman with a complicated abdominal history. In 2005 he underwent a colon resection for diverticulitis at [facility] With multiple postop complications including peritonitis, open abdominal wound, ileostomy. In 2006 he had the ileostomy revised. Later in the year he underwent a ventral hernia repair by Dr. Hassell Done with anterior fascial repair using ultra Pro mesh. Since that time he has done fairly well. He has episodes where he  says his mesh hurts. He has some bulging of his abdominal wall when this occurs. It normally goes away and a short time.  He will 3:30 AM with acute abdominal pain, he treated with Tums. The pain did not improve and he had nausea which was not normal. Pain became progressively worse and he ultimately called the EMS where he was seen in the ER by Dr. Edison Pace. White count is 20,800, hemoglobin is 18, hematocrit is 53. CMP was normal. Acute chest and abdomen shows a mild ileus but no definite obstruction or free air probable hiatal hernia. Exam shows the patient to be extremely distended and tender. He is tachycardic, hypertensive, and respiratory rate is 30 at rest. He was seen by Dr. Excell Seltzer, contrast dye for CT has been started. We plan to get the CT now, and take the patient to the operating directly after that for an incarcerated ventral hernia. He was taken from the ER to the OR with the above noted surgery and findings. He was maintained on the ventilator over the next two days and was very sick with peritonitis and Sepsis.  He was extubated the 3rd post op day.  He has been maintained on IV antibiotics over the next 9 days.  He has had some trouble with ileus and we place a PICC line in him along with TNA for several days. He had mild renal insuffiencey, which has improved.His open wound with biologic mesh has shown slow but steady improvement using NWPT and white foam covered with the black foam.  He had one area in the mid portion which has been slow to improve, but is getting  Better with each wound Vac change. His diet has slowly  been advanced and he's slowly getting normal bowel function back.  His bowels are still a little slow, we have added some Miralax and he has done well with it.Plan to discharge home today.  WBC and chemistries are all returning to normal     Sodium        134  136   Potassium        3.8  3.9   Chloride        103  101   CO2        24  25   BUN        12  12   Creatinine,  Ser        0.81  1.05   Calcium        8.3  8.3   GFR calc non Af Amer        90 mL/min">90  76   Glucose, Bld        124  94   Magnesium        2.3     Alkaline Phosphatase          80   Albumin          2.7   AST          51   ALT          34   Total Protein          6.8   Total Bilirubin          0.4    CBC    WBC        14.5  9.7   RBC        4.56  4.48   Hemoglobin        13.4  13.2   HCT        40.8  40.6   MCV        89.5  90.6   MCH        29.4  29.5   MCHC        32.8  32.5   RDW        13.8  13.6   Platelets        344  339     Glucose, Bld        124           Disposition:   Discharge Orders    Future Appointments: Provider: Department: Dept Phone: Center:   01/16/2012 10:20 AM [name], MD [facility] [phone] None     Medication List  As of 01/02/2012 10:44 AM   TAKE these medications         acetaminophen 325 MG tablet   Commonly known as: TYLENOL   Take 2 tablets (650 mg total) by mouth every 6 (six) hours as needed for pain.      HYDROcodone-acetaminophen 5-325 MG per tablet   Commonly known as: NORCO   Take 1-2 tablets by mouth every 4 (four) hours as needed.      polyethylene glycol packet   Commonly known as: MIRALAX / GLYCOLAX   Take 17 g by mouth daily as needed.           Follow-up Information    Follow up with HOXWORTH,BENJAMIN T, MD. Schedule an appointment as soon as possible for a visit in 2 weeks.   Contact information:   [employer], [place], [place]  [phone]       Follow up with [name], MD. (Call and make an appointment so he  can follow up.)    Contact information:   [place] [phone]       Follow up with Wound. (Wound Vac treatment:  Change 3 times a week, Monday, Wed, and Friday..  White foam over the open area, and especially the biologic graft then black foam.)            Signed: Argus Caraher 01/02/2012, 10:44 AM

## 2012-01-01 NOTE — Consult Note (Signed)
WOC consult Note Reason for Consult:Dressing change with Modena Jansky, PA Wound type:Sugical with biologic mesh Pressure Ulcer POA: Yes/No Measurement:per Friday Wound REQ:JEADG, pink, granulating Drainage (amount, consistency, odor) scant in VAC tubing Periwound:intact Dressing procedure/placement/frequency:V.A.C. Dressing changed, white foam used as wound contact layer (3 pieces), cover with black foam (2 pieces) and drape applied.  162mHg continuous pressure app;ied and tolerated well. I will follow until discharged. Thanks, LMaudie Flakes MSN, RN, [facility]([phone]

## 2012-01-02 MED ORDER — POLYETHYLENE GLYCOL 3350 17 G PO PACK: 17.0000 g | PACK | Freq: Every day | ORAL | Status: AC | PRN

## 2012-01-02 MED ORDER — HYDROCODONE-ACETAMINOPHEN 5-325 MG PO TABS: 1.0000 | ORAL_TABLET | ORAL | Status: AC | PRN

## 2012-01-02 MED ORDER — ACETAMINOPHEN 325 MG PO TABS: 650.0000 mg | ORAL_TABLET | Freq: Four times a day (QID) | ORAL | Status: DC | PRN

## 2012-01-02 NOTE — Progress Notes (Signed)
14 Days Post-Op  Subjective: Feels better, did well with Miralax and he thinks he's ready to go home,  Objective: Vital signs in last 24 hours: Temp:  [97.5 F (36.4 C)-98.1 F (36.7 C)] 97.5 F (36.4 C) (06/18 0604) Pulse Rate:  [74-81] 75  (06/18 0604) Resp:  [20] 20  (06/18 0604) BP: (112-130)/(67-88) 112/67 mmHg (06/18 0604) SpO2:  [93 %-98 %] 93 % (06/18 0604) Last BM Date: 01/01/12 +BM yesterday, afebrile, VSS, labs OK yesterday  Intake/Output from previous day: 06/17 0701 - 06/18 0700 In: 730 [P.O.:720; I.V.:10] Out: 851 [Urine:850; Stool:1] Intake/Output this shift: Total I/O In: 240 [P.O.:240] Out: -   General appearance: alert, appears stated age and no distress Resp: clear to auscultation bilaterally GI: Still tender, +BS, +BM.  skin under seal for wound vac has been a little red,but not much.  [name] the wound nurse was OK with it.  No distension.  Lab Results:   [facility] 01/01/12 1322  WBC 9.7  HGB 13.2  HCT 40.6  PLT 339    BMET  Basename 01/01/12 1322  NA 136  K 3.9  CL 101  CO2 25  GLUCOSE 94  BUN 12  CREATININE 1.05  CALCIUM 8.3*   PT/INR No results found for this basename: LABPROT:2,INR:2 in the last 72 hours   Lab 01/01/12 1322 12/28/11 0350 12/27/11 0510  AST 51* 25 24  ALT 34 16 15  ALKPHOS 80 56 55  BILITOT 0.4 0.8 1.1  PROT 6.8 5.9* 5.7*  ALBUMIN 2.7* 2.3* 2.2*     Lipase     Component Value Date/Time   LIPASE 34 12/19/2011 0725     Studies/Results: No results found.  Medications:    . alteplase  2 mg Intracatheter Once  . antiseptic oral rinse  15 mL Mouth Rinse QID  . chlorhexidine  15 mL Mouth Rinse BID  . heparin subcutaneous  5,000 Units Subcutaneous Q8H  . neomycin-bacitracin-polymyxin   Topical TID  . pantoprazole  40 mg Oral QHS  . polyethylene glycol  17 g Oral Daily  . DISCONTD: ertapenem (INVANZ) IV  1 g Intravenous Q24H    Assessment/Plan 1. Incarcerated ventral hernia with acute abdomen.  Perforated, Incarcerated hernia Ventral Hernia  Severe Peritonitis/Post OP ileus.  Open abdominal wound with Wound Vac closure. S/P EXPLORATORY, Small Bowel resection with repair of hernia with biologic mesh 12/19/11. Dr. Excell Seltzer.  2. History of partial colectomy, ileostomy, peritonitis with open wound 2005 at [facility].  3. Ileostomy reversal 2006  4. Ventral hernia repair with the ultra Pro mesh 02/10/2005 Dr. Johnathan Hausen  5. Hiatal hernia  6. Dyslipidemia  7. History of asthma  8. Psoriasis  9. 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  We will dc PICC, home health has been arranged for his wound Vac.      LOS: 14 days    [name],[name] 01/02/2012  Doing great.  Okay for discharge with home health wound vac changes

## 2012-01-02 NOTE — Progress Notes (Signed)
Patient is to be discharged home today; [employer] to deliver equipment for his wound to his room today prior to discharge; Scurry is to be provided by [facility]; B IT sales professional, BSN, [employer]

## 2012-01-10 ENCOUNTER — Telehealth (INDEPENDENT_AMBULATORY_CARE_PROVIDER_SITE_OTHER): Payer: Self-pay | Admitting: General Surgery

## 2012-01-10 NOTE — Telephone Encounter (Signed)
Pt calling to request refill of pain meds.  States he is doing well overall, and he uses pain meds for dressing changes (wound vac) and at night to sleep well.  Call in per protocol Hydrocodone 5/325 mg, # 30,  1 po Q 4-6 H prn pain, no refill to [facility]:  [phone].

## 2012-01-16 ENCOUNTER — Encounter (INDEPENDENT_AMBULATORY_CARE_PROVIDER_SITE_OTHER): Payer: Self-pay | Admitting: General Surgery

## 2012-01-16 ENCOUNTER — Ambulatory Visit (INDEPENDENT_AMBULATORY_CARE_PROVIDER_SITE_OTHER): Payer: 59 | Admitting: General Surgery

## 2012-01-16 VITALS — BP 130/92 | HR 95 | Temp 97.2°F | Ht 70.0 in | Wt 209.0 lb

## 2012-01-16 DIAGNOSIS — Z09 Encounter for follow-up examination after completed treatment for conditions other than malignant neoplasm: Secondary | ICD-10-CM

## 2012-01-16 NOTE — Patient Instructions (Signed)
Discontinue VAC dressing  Begin daily moist saline dressings  No heavy lifting or straining or work until after next office visit

## 2012-01-16 NOTE — Progress Notes (Signed)
History: Patient returns 4 weeks after emergency laparotomy and small bowel resection for an incarcerated recurrent ventral hernia with perforation and peritonitis. He underwent a small bowel resection with abdominal wall closure with biologic material and a VAC dressing.He is getting along very well. He has some expected discomfort. He is eating without pain or vomiting although his appetite is poor. No fever. Bowels are moving okay.  Exam: He generally appears well. Abdomen is soft and nontender. I removed the VAC dressing. There is a very minimal strip of clean granulation tissue flush with the skin.  Assessment and plan: Doing well following the above procedure without complication. We will go to moist saline dressing changes. No work or lifting until I see him back in 4 weeks.

## 2012-01-17 ENCOUNTER — Telehealth (INDEPENDENT_AMBULATORY_CARE_PROVIDER_SITE_OTHER): Payer: Self-pay | Admitting: General Surgery

## 2012-01-17 ENCOUNTER — Telehealth: Payer: Self-pay | Admitting: Family Medicine

## 2012-01-17 NOTE — Telephone Encounter (Signed)
Caller: [name]/Patient; PCP: [name].; CB#: [phone] re:  Sinus pain.  Onset 01/16/12.  Pain rated at 3-4 of 10, above and below eyes, sore throat.  Mild fever/ subjective.  Green nasal drainage.   Advised see in 24 hours per Face Pain or Swelling protocol.  Checked with Elmyra Ricks at office re:  possible appointment for today and was advised none available.  Caller advised to go to UC per Face Pain or Swelling protocol.   Caller states he does not want to go to UC due to recent surgery for SBO and may wait until 7/5 to make appt at office.

## 2012-01-17 NOTE — Telephone Encounter (Signed)
Joyce Gross, nurse with [facility],calling to inform [facility] they are discharging pt from [facility], as the wound vac is off and pt can do his own dressings now.

## 2012-01-17 NOTE — Telephone Encounter (Signed)
Pt is calling to ask who he should contact regarding new onset sinus infection.  He is two weeks postop.  Recommended he contact his PCP.  He understands and will comply.

## 2012-01-19 ENCOUNTER — Encounter: Payer: Self-pay | Admitting: Internal Medicine

## 2012-01-19 ENCOUNTER — Telehealth (INDEPENDENT_AMBULATORY_CARE_PROVIDER_SITE_OTHER): Payer: Self-pay | Admitting: General Surgery

## 2012-01-19 ENCOUNTER — Ambulatory Visit (INDEPENDENT_AMBULATORY_CARE_PROVIDER_SITE_OTHER): Payer: 59 | Admitting: Internal Medicine

## 2012-01-19 VITALS — BP 110/74 | Temp 97.9°F | Wt 209.0 lb

## 2012-01-19 DIAGNOSIS — J019 Acute sinusitis, unspecified: Secondary | ICD-10-CM

## 2012-01-19 MED ORDER — AMOXICILLIN-POT CLAVULANATE 875-125 MG PO TABS: 1.0000 | ORAL_TABLET | Freq: Two times a day (BID) | ORAL | Status: AC

## 2012-01-19 NOTE — Telephone Encounter (Signed)
He may have norco called in once more  25 with no refills

## 2012-01-19 NOTE — Telephone Encounter (Signed)
Patient has an appointment.

## 2012-01-19 NOTE — Patient Instructions (Signed)
Take your antibiotic as prescribed until ALL of it is gone, but stop if you develop a rash, swelling, or any side effects of the medication.  Contact our office as soon as possible if  there are side effects of the medication.  Get plenty of rest, Drink lots of  clear liquids, and use Tylenol or ibuprofen for fever and discomfort.    Nasonex/sudafed and Mucinex

## 2012-01-19 NOTE — Telephone Encounter (Addendum)
rx for norco 5/325 #25 1q 4-6 hours prn, pain no refills [facility] [address].

## 2012-01-19 NOTE — Telephone Encounter (Signed)
The patient contacted the office and requested a refill on his norco 5/325. He states during the day he uses tylenol and at night he uses the Norco to rest comfortably. He would like [facility] [address] and for [place] to call when it has been done. Expressed to him we have given his protocol refill and we will need to check with the Dr. Rock Nephew verbalized understanding. Sent message to urgent care Dr which is Dr Magnus Ivan, Dr Johna Sheriff is out of the office.

## 2012-01-19 NOTE — Progress Notes (Signed)
  Subjective:    Patient ID: [name], male    DOB: 11-26-1950, 61 y.o.   MRN: [id number]  HPI 61 year old patient who is seen today with concerns about a sinus infection. He has had low-grade fever and increasing sinus pressure pain and some postnasal drip. He was discharged from the hospital less than one month ago following surgery for incarceration concreted by septic shock and respiratory failure requiring ventilatory support. He also had a nasogastric tube placed. He has had a history of sinus infections in the past      Review of Systems  Constitutional: Positive for fever and fatigue. Negative for chills and appetite change.  HENT: Positive for congestion and postnasal drip. Negative for hearing loss, ear pain, sore throat, trouble swallowing, neck stiffness, dental problem, voice change and tinnitus.   Eyes: Negative for pain, discharge and visual disturbance.  Respiratory: Negative for cough, chest tightness, wheezing and stridor.   Cardiovascular: Negative for chest pain, palpitations and leg swelling.  Gastrointestinal: Negative for nausea, vomiting, abdominal pain, diarrhea, constipation, blood in stool and abdominal distention.  Genitourinary: Negative for urgency, hematuria, flank pain, discharge, difficulty urinating and genital sores.  Musculoskeletal: Negative for myalgias, back pain, joint swelling, arthralgias and gait problem.  Skin: Negative for rash.  Neurological: Negative for dizziness, syncope, speech difficulty, weakness, numbness and headaches.  Hematological: Negative for adenopathy. Does not bruise/bleed easily.  Psychiatric/Behavioral: Negative for behavioral problems and dysphoric mood. The patient is not nervous/anxious.        Objective:   Physical Exam  Constitutional: He is oriented to person, place, and time. He appears well-developed and well-nourished. No distress.       Temperature 97.9  HENT:  Head: Normocephalic.  Right Ear: External ear  normal.  Left Ear: External ear normal.       Maxillary sinus tenderness right greater than the left  Eyes: Conjunctivae and EOM are normal.  Neck: Normal range of motion.  Cardiovascular: Normal rate and normal heart sounds.   Pulmonary/Chest: Breath sounds normal.  Abdominal: Bowel sounds are normal.  Musculoskeletal: Normal range of motion. He exhibits no edema and no tenderness.  Neurological: He is alert and oriented to person, place, and time.  Psychiatric: He has a normal mood and affect. His behavior is normal.          Assessment & Plan:  Subacute sinusitis. We'll treat with Augmentin for 10 days we'll place on probiotic decongestants and expectorants.

## 2012-01-23 ENCOUNTER — Telehealth (INDEPENDENT_AMBULATORY_CARE_PROVIDER_SITE_OTHER): Payer: Self-pay | Admitting: General Surgery

## 2012-01-23 NOTE — Telephone Encounter (Signed)
Pt calling with 2 questions:  First, how long to continue the wet-to-dry dressings.  Per MD note, continue until wound is completely closed or his next appt with physician.  Second, needed advice on how to remove adhesive residue----explained to use nail polish remover (acetone) and keep well-away from wound.  Pt understands.

## 2012-01-25 ENCOUNTER — Ambulatory Visit (INDEPENDENT_AMBULATORY_CARE_PROVIDER_SITE_OTHER): Payer: 59 | Admitting: Family

## 2012-01-25 ENCOUNTER — Encounter: Payer: Self-pay | Admitting: Family

## 2012-01-25 VITALS — BP 146/84 | Temp 97.8°F | Wt 212.0 lb

## 2012-01-25 DIAGNOSIS — R350 Frequency of micturition: Secondary | ICD-10-CM

## 2012-01-25 DIAGNOSIS — N410 Acute prostatitis: Secondary | ICD-10-CM

## 2012-01-25 LAB — POCT URINALYSIS DIPSTICK
Protein, UA: 1
Spec Grav, UA: >=1.03
Urobilinogen, UA: 0.2
pH, UA: 6

## 2012-01-25 MED ORDER — CIPROFLOXACIN HCL 500 MG PO TABS: 500.0000 mg | ORAL_TABLET | Freq: Two times a day (BID) | ORAL | Status: AC

## 2012-01-25 NOTE — Progress Notes (Signed)
Subjective:    Patient ID: [name], male    DOB: [birth year], 61 y.o.   MRN: [id number]  HPI 61 year old white male, nonsmoker, patient of Dr. Lovell Sheehan is in today with complaints of frequent urination, prostate pain, and urinary urgency. He denies any new oh, pain or back pain. He has a history of prostatitis in the past. Patient had an abdominal surgery 12/19/2011 and became septic in the hospital. He was in the ICU for several days. He continues to have an open wound on his abdomen the dressed and bandaged. He is following up with a surgeon with regards to this issue.   Review of Systems  Constitutional: Negative.   Respiratory: Negative.   Cardiovascular: Negative.   Gastrointestinal: Negative for nausea, vomiting and abdominal distention.       Abdominal wound from surgery  Genitourinary: Positive for urgency and frequency.  Musculoskeletal: Negative.   Psychiatric/Behavioral: Negative.    Past Medical History  Diagnosis Date  . Diverticulitis of colon   . Hepatitis 1975    unknown type   . Asthma   . GERD (gastroesophageal reflux disease)   . Hiatal hernia   . BPH (benign prostatic hypertrophy) with urinary obstruction   . Psoriasis     se[facility] Dr. Karene Fry at [facility].  . Ileus following gastrointestinal surgery 12/26/2011    History   Social History  . Marital Status: Married    Spouse Name: N/A    Number of Children: N/A  . Years of Education: N/A   Occupational History  . Not on file.   Social History Main Topics  . Smoking status: Never Smoker   . Smokeless tobacco: Never Used  . Alcohol Use: Yes     once a month or less  . Drug Use: No  . Sexually Active: Not on file   Other Topics Concern  . Not on file   Social History Narrative  . No narrative on file    Past Surgical History  Procedure Date  . Hemicolectomy     left side, at [facility]  . Ileostomy   . Colonoscopy   . Cystoscopy   . Bronchoscopy   . Tonsillectomy   . Vasectomy     . Hernia repair     incisional hernia  . Laparotomy 12/19/2011    Procedure: EXPLORATORY LAPAROTOMY;  Surgeon: Mariella Saa, MD;  Location: [facility] ORS;  Service: General;  Laterality: N/A;  . Bowel resection 12/19/2011    Procedure: SMALL BOWEL RESECTION;  Surgeon: Mariella Saa, MD;  Location: [facility] ORS;  Service: General;  Laterality: N/A;  with anastamosis and insertion mesh  . Colon surgery 01/2004    Family History  Problem Relation Age of Onset  . Cancer Mother     lung  . Cancer Father     leukemia  . Hypertension Father     Allergies  Allergen Reactions  . Shellfish Allergy     Throat stated to close    Current Outpatient Prescriptions on File Prior to Visit  Medication Sig Dispense Refill  . amoxicillin-clavulanate (AUGMENTIN) 875-125 MG per tablet Take 1 tablet by mouth 2 (two) times daily.  20 tablet  0  . HYDROcodone-acetaminophen (NORCO) 5-325 MG per tablet Take 1 tablet by mouth every 6 (six) hours as needed.       . polyethylene glycol powder (MIRALAX) powder Take 17 g by mouth daily.        BP 146/84  Temp 97.8 F (36.6  C) (Oral)  Wt 212 lb (96.163 kg)chart    Objective:   Physical Exam  Constitutional: He is oriented to person, place, and time. He appears well-developed and well-nourished.  Cardiovascular: Normal rate, regular rhythm and normal heart sounds.   Pulmonary/Chest: Effort normal and breath sounds normal.  Abdominal: Soft. Bowel sounds are normal.  Genitourinary:       Prostate is soft, tender, and boggy.  Neurological: He is alert and oriented to person, place, and time.  Skin: Skin is warm and dry.  Psychiatric: He has a normal mood and affect.          Assessment & Plan:  Assessment: Urinary frequency, urinary urgency, prostatitis  Plan: Cipro 500 mg one tablet twice a day x2 weeks. Drink plenty of fluids. Patient call the office symptoms worsen or persist. Recheck as scheduled and sooner when necessary.

## 2012-01-25 NOTE — Patient Instructions (Addendum)
Prostatitis Prostatitis is an inflammation (the body's way of reacting to injury and/or infection) of the prostate gland. The prostate gland is a male organ. The gland is about the size and shape of a walnut. The prostate is located just below the bladder. It produces semen, which is a fluid that helps nourish and transport sperm. Prostatitis is the most common urinary tract problem in men younger than age 59. There are 4 categories of prostatitis:  I - Acute bacterial prostatitis.   II - Chronic bacterial prostatitis.   III - Chronic prostatitis and chronic pelvic pain syndrome (CPPS).   Inflammatory.   Non inflammatory.   IV - Asymptomatic inflammatory prostatitis.  Acute and chronic bacterial prostatitis are problems with bacterial infections of the prostate. "Acute" infection is usually a one-time problem. "Chronic" bacterial prostatitis is a condition with recurrent infection. It is usually caused by the same germ(bacteria). CPPS has symptoms similar to prostate infection. However, no infection is actually found. This condition can cause problems of ongoing pain. Currently, it cannot be cured. Treatments are available and aimed at symptom control.  Asymptomatic inflammatory prostatitis has no symptoms. It is a condition where infection-fighting cells are found by chance in the urine. The diagnosis is made most often during an exam for other conditions. Other conditions could be infertility or a high level of PSA (prostate-specific antigen) in the blood. SYMPTOMS  Symptoms can vary depending upon the type of prostatitis that exists. There can also be overlap in symptoms. This can make diagnosis difficult. Symptoms: For Acute bacterial prostatitis  Painful urination.   Fever or chills.   Muscle or joint pains.   Low back pain.   Low abdominal pain.   Inability to empty bladder completely.   Sudden urges to urinate.   Frequent urination during the day.   Difficulty starting  urine stream.   Need to urinate several times at night (nocturia).   Weak urine stream.   Urethral (tube that carries urine from the bladder out of the body) discharge and dribbling after urination.  For Chronic bacterial prostatitis  Rectal pain.   Pain in the testicles, penis, or tip of the penis.   Pain in the space between the anus and scrotum (perineum).   Low back pain.   Low abdominal pain.   Problems with sexual function.   Painful ejaculation.   Bloody semen.   Inability to empty bladder completely.   Painful urination.   Sudden urges to urinate.   Frequent urination during the day.   Difficulty starting urine stream.   Need to urinate several times at night (nocturia).   Weak urine stream.   Dribbling after urination.   Urethral discharge.  For Chronic prostatitis and chronic pelvic pain syndrome (CPPS) Symptoms are the same as those for chronic bacterial prostatitis. Problems with sexual function are often the reason for seeking care. This important problem should be discussed with your caregiver. For Asymptomatic inflammatory prostatitis As noted above, there are no symptoms with this condition. DIAGNOSIS   Your caregiver may perform a rectal exam. This exam is to determine if the prostate is swollen and tender.   Sometimes blood work is performed. This is done to see if your white blood cell count is elevated. The Prostate Specific Antigen (PSA) is also measured. PSA is a blood test that can help detect early prostate cancer.   A urinalysis is done to find out what type of infection is present if this is a suspected cause. An   additional urinalysis may be done after a digital rectal exam. This is to see if white blood cells are pushed out of the prostate and into the urine. A low-grade infection of the prostate may not be found on the first urinalysis.  In more difficult cases, your caregiver may advise other tests. Tests could include:  Urodynamics  -- Tests the function of the bladder and the organs involved in triggering and controlling normal urination.   Urine flow rate.   Cystoscopy -- In this procedure, a thin, telescope-like tube with a light and tiny camera attached (cystoscope) is inserted into the bladder through the urethra. This allows the caregiver to see the inside of the urethra and bladder.   Electromyography -- This procedure tests how the muscles and nerves of the bladder work. It is focused on the muscles that control the anus and pelvic floor. These are the muscles between the anus and scrotum.  In people who show no signs of infection, certain uncommon infections might be causing constant or recurrent symptoms. These uncommon infections are difficult to detect. More work in medicine may help find solutions to these problems. TREATMENT  Antibiotics are used to treat infections caused by germs. If the infection is not treated and becomes long lasting (chronic), it may become a lower grade infection with minor, continual problems. Without treatment, the prostate may develop a boil or furuncle (abscess). This may require surgical treatment. For those with chronic prostatitis and CPPS, it is important to work closely with your primary caregiver and urologist. For some, the medicines that are used to treat a non-cancerous, enlarged prostate (benign prostatic hypertrophy) may be helpful. Referrals to specialists other than urologists may be necessary. In rare cases when all treatments have been inadequate for pain control, an operation to remove the prostate may be recommended. This is very rare and before this is considered thorough discussion with your urologist is highly recommended.  In cases of secondary to chronic non-bacterial prostatitis, a good relationship with your urologist or primary caregiver is essential because it is often a recurrent prolonged condition that requires a good understanding of the causes and a commitment  to therapy aimed at controlling your symptoms. HOME CARE INSTRUCTIONS   Hot sitz baths for 20 minutes, 4 times per day, may help relieve pain.   Non-prescription pain killers may be used as your caregiver recommends if you have no allergies to them. Some illnesses or conditions prevent use of non-prescription drugs. If unsure, check with your caregiver. Take all medications as directed. Take the antibiotics for the prescribed length of time, even if you are feeling better.  SEEK MEDICAL CARE IF:   You have any worsening of the symptoms that originally brought you to your caregiver.   You have an oral temperature above 102 F (38.9 C).   You experience any side effects from medications prescribed.  SEEK IMMEDIATE MEDICAL CARE IF:   You have an oral temperature above 102 F (38.9 C), not controlled by medicine.   You have pain not relieved with medications.   You develop nausea, vomiting, lightheadedness, or have a fainting episode.   You are unable to urinate.   You pass bloody urine or clots.  Document Released: 06/30/2000 Document Revised: 06/22/2011 Document Reviewed: 06/05/2011 [employer] Patient Information 2012 [employer]. 

## 2012-02-01 ENCOUNTER — Telehealth (INDEPENDENT_AMBULATORY_CARE_PROVIDER_SITE_OTHER): Payer: Self-pay | Admitting: General Surgery

## 2012-02-01 NOTE — Telephone Encounter (Signed)
Pt called to report the Rt side of his hernia (now repaired) is sticking out about 2cm more than on the Lt side.  He further reports there is slightly more discomfort in that area.  He will be in next week for followup appt.

## 2012-02-08 ENCOUNTER — Ambulatory Visit (INDEPENDENT_AMBULATORY_CARE_PROVIDER_SITE_OTHER): Payer: 59 | Admitting: General Surgery

## 2012-02-08 ENCOUNTER — Encounter (INDEPENDENT_AMBULATORY_CARE_PROVIDER_SITE_OTHER): Payer: Self-pay | Admitting: General Surgery

## 2012-02-08 VITALS — BP 102/70 | HR 60 | Temp 97.5°F | Resp 14 | Ht 70.0 in | Wt 207.0 lb

## 2012-02-08 DIAGNOSIS — Z09 Encounter for follow-up examination after completed treatment for conditions other than malignant neoplasm: Secondary | ICD-10-CM

## 2012-02-08 NOTE — Progress Notes (Signed)
History: Patient returns for more long-term followup now 8 weeks following emergency laparotomy for incarcerated recurrent ventral hernia with small bowel obstruction and perforation and peritonitis. He still has some significant abdominal discomfort went up and moving around. No GI complaints. He is changing a gauze dressing over his wound daily and there is no drainage.  Exam: BP 102/70  Pulse 60  Temp 97.5 F (36.4 C) (Temporal)  Resp 14  Ht 5' 10"  (1.778 m)  Wt 207 lb (93.895 kg)  BMI 29.70 kg/m2 General: Does not appear ill Abdomen: His midline wound is now completely healed. Generally soft and nontender. With him doing an abdominal crunch I cannot feel any evidence of hernia.  Assessment and plan: Doing well following surgery as above. He still has a lot of fatigue and discomfort and I don't believe will be ready for work for 4 more weeks. He is released to return to work first Monday in September and I will see him back in the office at about that time.

## 2012-02-08 NOTE — Patient Instructions (Addendum)
May progressively increase activity but no lifting over 20 pounds.  No wound care needed

## 2012-02-14 ENCOUNTER — Ambulatory Visit (INDEPENDENT_AMBULATORY_CARE_PROVIDER_SITE_OTHER): Payer: 59 | Admitting: Family Medicine

## 2012-02-14 ENCOUNTER — Encounter: Payer: Self-pay | Admitting: Family Medicine

## 2012-02-14 VITALS — BP 140/74 | HR 75 | Temp 98.0°F | Wt 212.0 lb

## 2012-02-14 DIAGNOSIS — N4281 Prostatodynia syndrome: Secondary | ICD-10-CM

## 2012-02-14 DIAGNOSIS — N4289 Other specified disorders of prostate: Secondary | ICD-10-CM

## 2012-02-14 MED ORDER — HYDROCODONE-ACETAMINOPHEN 7.5-750 MG PO TABS: 1.0000 | ORAL_TABLET | Freq: Three times a day (TID) | ORAL | Status: AC | PRN

## 2012-02-14 NOTE — Progress Notes (Signed)
  Subjective:    Patient ID: [name], male    DOB: Dec 22, 1950, 61 y.o.   MRN: [id number]  HPI Here for continued problems with urinary urgency and perineal pain. He was seen here several weeks ago for a prostate infection and was given a course of Cipro. He feels better now but still has perineal pain. No fever. He asks to see a Dealer and I agree.    Review of Systems  Constitutional: Negative.   Genitourinary: Positive for urgency and difficulty urinating. Negative for dysuria, frequency, hematuria, flank pain, discharge and testicular pain.       Objective:   Physical Exam  Constitutional: He appears well-developed and well-nourished.          Assessment & Plan:  Refer to [facility] in [facility].

## 2012-02-21 ENCOUNTER — Encounter (INDEPENDENT_AMBULATORY_CARE_PROVIDER_SITE_OTHER): Payer: 59 | Admitting: General Surgery

## 2012-03-07 ENCOUNTER — Encounter (INDEPENDENT_AMBULATORY_CARE_PROVIDER_SITE_OTHER): Payer: Self-pay | Admitting: General Surgery

## 2012-03-07 ENCOUNTER — Ambulatory Visit (INDEPENDENT_AMBULATORY_CARE_PROVIDER_SITE_OTHER): Payer: 59 | Admitting: General Surgery

## 2012-03-07 VITALS — BP 126/78 | HR 61 | Ht 70.0 in | Wt 210.8 lb

## 2012-03-07 DIAGNOSIS — M950 Acquired deformity of nose: Secondary | ICD-10-CM

## 2012-03-07 DIAGNOSIS — S0992XA Unspecified injury of nose, initial encounter: Secondary | ICD-10-CM

## 2012-03-07 DIAGNOSIS — Z09 Encounter for follow-up examination after completed treatment for conditions other than malignant neoplasm: Secondary | ICD-10-CM

## 2012-03-07 DIAGNOSIS — S199XXA Unspecified injury of neck, initial encounter: Secondary | ICD-10-CM

## 2012-03-07 DIAGNOSIS — S0993XA Unspecified injury of face, initial encounter: Secondary | ICD-10-CM

## 2012-03-07 NOTE — Progress Notes (Signed)
History: Patient returns now 3 months following emergency laparotomy for incarcerated recurrent incisional hernia with small bowel obstruction and gangrene requiring resection. His wound was repaired with biologic mesh and heal secondarily. He is feeling much better. He has no significant abdominal pain and is planning to return to work in the next couple of weeks. He did have an injury to his right nares from his NG tube a hospitalized he would like to see a Psychiatric nurse for this.  Exam: Gen.: Appears well Abdomen: Completely healed midline incision. There is a probable early and expected incisional defect to the right of the midline in the epigastrium. This is minimal at this point.  Assessment and plan: Doing well following emergency small bowel resection and hernia repair. I'm going to see him back in 6 months for more long-term followup and we discussed he may eventually need repair with permanent mesh. We will make a referral to plastic surgery for him.

## 2012-03-13 ENCOUNTER — Telehealth (INDEPENDENT_AMBULATORY_CARE_PROVIDER_SITE_OTHER): Payer: Self-pay | Admitting: General Surgery

## 2012-03-13 NOTE — Telephone Encounter (Signed)
Pt calling to report the bulging (noted at last visit) is increasing in size, and has aching pain which is usually eased by changing positions.  He would like Dr. Johna Sheriff to re-evaluate it.  Meanwhile, pt encouraged to use abdominal binder for support.  He states he has one that he has used in the past and will try that.  Also he states he is waiting to hear about a referral to Dr. Odis Luster.  Please call pt for possible work-in appt with [facility] and update on referral.

## 2012-03-15 ENCOUNTER — Telehealth (INDEPENDENT_AMBULATORY_CARE_PROVIDER_SITE_OTHER): Payer: Self-pay

## 2012-03-15 ENCOUNTER — Ambulatory Visit (INDEPENDENT_AMBULATORY_CARE_PROVIDER_SITE_OTHER): Payer: 59 | Admitting: Surgery

## 2012-03-15 VITALS — BP 122/78 | HR 78 | Temp 97.2°F | Resp 18 | Ht 70.0 in | Wt 211.0 lb

## 2012-03-15 DIAGNOSIS — Z09 Encounter for follow-up examination after completed treatment for conditions other than malignant neoplasm: Secondary | ICD-10-CM

## 2012-03-15 NOTE — Progress Notes (Signed)
Subjective:     Patient ID: [name], male   DOB: [birth year], 61 y.o.   MRN: [id number]  HPI He is here for another postop visit. He just saw Dr. Excell Seltzer last week.  He is having some increased pain around the midportion of his incision. It was noted in the notes that he may have a recurrence. He has no nausea or emesis.  Review of Systems     Objective:   Physical Exam    on exam, there is an easily reducible incisional hernia Assessment:     Recurrent incisional hernia    Plan:     I will have him see Dr. Excell Seltzer in the next week or so to discuss hernia surgery. Currently again he is not incarcerated and has a benign abdomen

## 2012-03-15 NOTE — Telephone Encounter (Signed)
Patient has appointment with Dr. Odis Luster on 03/25/12 @ 2:15pm, Office notes, Op Notes faxed to [phone].

## 2012-03-21 ENCOUNTER — Encounter (INDEPENDENT_AMBULATORY_CARE_PROVIDER_SITE_OTHER): Payer: Self-pay | Admitting: General Surgery

## 2012-03-21 ENCOUNTER — Ambulatory Visit (INDEPENDENT_AMBULATORY_CARE_PROVIDER_SITE_OTHER): Payer: 59 | Admitting: General Surgery

## 2012-03-21 VITALS — BP 130/72 | HR 60 | Temp 96.5°F | Resp 18 | Ht 70.0 in | Wt 212.0 lb

## 2012-03-21 DIAGNOSIS — K432 Incisional hernia without obstruction or gangrene: Secondary | ICD-10-CM

## 2012-03-21 NOTE — Progress Notes (Signed)
Chief complaint: Incisional hernia  History: Patient returns for more short term followup. He was seen in the urgent office couple of weeks ago due to increased discomfort at his incisional hernia. He of course has a history of incarcerated incisional hernia requiring small bowel resection with perforation and peritonitis in June. This was repaired with biologic material with bridging required and we had discussed that he likely would develop recurrent hernia that would require repair. He states he has some pain occasionally but no nausea or vomiting.  Exam: There is a several centimeter soft reducible nontender incisional hernia just to the right of the midline in the upper abdomen.  Assessment and plan: Incisional hernia. This is symptomatic but he is only about 3 months following his emergency bowel resection with peritonitis and I would like to wait a minimum of 6 months before considering repair. I don't think this is extremely urgent at this point. He get symptomatic relief by wearing a binder and will continue this. I will see him back early next year and we can consider scheduling repair which likely will need to be open.

## 2012-03-21 NOTE — Patient Instructions (Signed)
Call for worsening pain or other concerns and otherwise will see back in January

## 2012-04-08 ENCOUNTER — Telehealth: Payer: Self-pay | Admitting: Family Medicine

## 2012-04-08 NOTE — Telephone Encounter (Signed)
Refill request for Norco 7.5-500 mg take 1 po q6hrs prn and last here on 02/14/12.

## 2012-04-09 MED ORDER — HYDROCODONE-ACETAMINOPHEN 7.5-500 MG PO TABS: 1.0000 | ORAL_TABLET | Freq: Four times a day (QID) | ORAL | Status: DC | PRN

## 2012-04-09 NOTE — Telephone Encounter (Signed)
I called in script 

## 2012-04-09 NOTE — Telephone Encounter (Signed)
Call in #60 with 2 rf

## 2012-04-11 ENCOUNTER — Other Ambulatory Visit: Payer: Self-pay | Admitting: Internal Medicine

## 2012-04-11 NOTE — Telephone Encounter (Signed)
Dr Fry pt 

## 2012-04-11 NOTE — Telephone Encounter (Signed)
We just did this 2 days ago

## 2012-05-10 ENCOUNTER — Ambulatory Visit (INDEPENDENT_AMBULATORY_CARE_PROVIDER_SITE_OTHER): Payer: 59 | Admitting: General Surgery

## 2012-05-10 ENCOUNTER — Encounter (INDEPENDENT_AMBULATORY_CARE_PROVIDER_SITE_OTHER): Payer: Self-pay | Admitting: General Surgery

## 2012-05-10 VITALS — BP 140/80 | HR 68 | Temp 98.0°F | Resp 18 | Ht 70.0 in | Wt 214.0 lb

## 2012-05-10 DIAGNOSIS — K432 Incisional hernia without obstruction or gangrene: Secondary | ICD-10-CM

## 2012-05-10 HISTORY — DX: Incisional hernia without obstruction or gangrene: K43.2

## 2012-05-10 MED ORDER — HYDROCODONE-ACETAMINOPHEN 7.5-500 MG PO TABS: 1.0000 | ORAL_TABLET | Freq: Four times a day (QID) | ORAL | Status: DC | PRN

## 2012-05-10 NOTE — Progress Notes (Signed)
Chief complaint: Pain and hernia site History: Patient comes in for recheck at his request due to some increased discomfort at his incisional hernia site. He states he's had a couple episodes of significant pain that went away with an hour after lying down and resting. Middle nausea but no vomiting. His surgical history is significant for laparoscopic right colectomy at [facility] followed by an anastomotic leak requiring ileostomy and subsequent ileostomy takedown. He then had repair of incisional hernia with mesh by Dr. Hassell Done here. I then operated on him for an incarcerated small bowel with perforation and used biologic mesh and he has a recurrence in his upper midline wound of his incisional hernia.  Exam: General: Appears well Abdomen generally soft and nontender. There is what feels to be a very discrete defect measuring 4-5 cm in diameter just to the right of the midline above the umbilicus. No other hernias palpable.  Assessment and plan: Incisional hernia. This appears stable and I think we can plan an elective repair as previously discussed. I will go ahead and get this scheduled for him in January LC him back in the office for a preop visit prior to that.

## 2012-05-16 ENCOUNTER — Encounter: Payer: Self-pay | Admitting: Family Medicine

## 2012-05-16 ENCOUNTER — Ambulatory Visit (INDEPENDENT_AMBULATORY_CARE_PROVIDER_SITE_OTHER): Payer: 59 | Admitting: Family Medicine

## 2012-05-16 VITALS — BP 128/72 | HR 68 | Temp 98.0°F | Wt 219.0 lb

## 2012-05-16 DIAGNOSIS — R5383 Other fatigue: Secondary | ICD-10-CM

## 2012-05-16 DIAGNOSIS — R5381 Other malaise: Secondary | ICD-10-CM

## 2012-05-16 LAB — BASIC METABOLIC PANEL
CO2: 28 mEq/L (ref 19–32)
Chloride: 108 mEq/L (ref 96–112)
Creatinine, Ser: 1.1 mg/dL (ref 0.4–1.5)
Potassium: 4.4 mEq/L (ref 3.5–5.1)

## 2012-05-16 LAB — HEPATIC FUNCTION PANEL
ALT: 26 U/L (ref 0–53)
AST: 37 U/L (ref 0–37)
Alkaline Phosphatase: 62 U/L (ref 39–117)
Bilirubin, Direct: 0 mg/dL (ref 0.0–0.3)
Total Bilirubin: 0.4 mg/dL (ref 0.3–1.2)
Total Protein: 7 g/dL (ref 6.0–8.3)

## 2012-05-16 LAB — POCT URINALYSIS DIPSTICK
Blood, UA: NEGATIVE
Ketones, UA: NEGATIVE
Protein, UA: NEGATIVE
Spec Grav, UA: 1.025
Urobilinogen, UA: 0.2
pH, UA: 5.5

## 2012-05-16 LAB — LIPID PANEL
Cholesterol: 152 mg/dL (ref 0–200)
Total CHOL/HDL Ratio: 5

## 2012-05-16 LAB — CBC WITH DIFFERENTIAL/PLATELET
Basophils Absolute: 0 10*3/uL (ref 0.0–0.1)
Eosinophils Absolute: 0.2 10*3/uL (ref 0.0–0.7)
Hemoglobin: 15.7 g/dL (ref 13.0–17.0)
Lymphocytes Relative: 34.5 % (ref 12.0–46.0)
MCHC: 32.6 g/dL (ref 30.0–36.0)
Monocytes Relative: 7 % (ref 3.0–12.0)
Neutrophils Relative %: 56.3 % (ref 43.0–77.0)
RBC: 5.49 Mil/uL (ref 4.22–5.81)
RDW: 14.2 % (ref 11.5–14.6)

## 2012-05-16 LAB — TSH: TSH: 0.87 u[IU]/mL (ref 0.35–5.50)

## 2012-05-16 LAB — SEDIMENTATION RATE: Sed Rate: 6 mm/hr (ref 0–22)

## 2012-05-16 NOTE — Progress Notes (Signed)
  Subjective:    Patient ID: [name], male    DOB: 03-14-1951, 61 y.o.   MRN: [id number]  HPI Here to discuss chronic fatigue, which has bothered him since his last bowel surgery. He eats well and has actually gained some weight since then. He can think of no reason why he would be tired. He has also had some mild intermittent joint pains for a month or so. He is taking long term Tetracycline for prostate issues.    Review of Systems  Constitutional: Positive for fatigue. Negative for fever, chills, activity change, appetite change and unexpected weight change.  Respiratory: Negative.   Cardiovascular: Negative.   Gastrointestinal: Negative.   Genitourinary: Negative.   Musculoskeletal: Positive for arthralgias. Negative for myalgias and joint swelling.  Neurological: Negative.        Objective:   Physical Exam  Constitutional: He is oriented to person, place, and time. He appears well-developed and well-nourished. No distress.  Cardiovascular: Normal rate, regular rhythm, normal heart sounds and intact distal pulses.   Pulmonary/Chest: Effort normal and breath sounds normal.  Neurological: He is alert and oriented to person, place, and time.          Assessment & Plan:  Fatigue of uncertain etiology. Get labs and go from there

## 2012-05-17 LAB — RHEUMATOID FACTOR: Rhuematoid fact SerPl-aCnc: <10 IU/mL (ref <=14)

## 2012-05-17 LAB — VITAMIN D 25 HYDROXY (VIT D DEFICIENCY, FRACTURES): Vit D, 25-Hydroxy: 13 ng/mL — ABNORMAL LOW (ref 30–89)

## 2012-05-17 NOTE — Progress Notes (Signed)
Quick Note:  I spoke with pt ______ 

## 2012-06-18 ENCOUNTER — Ambulatory Visit (INDEPENDENT_AMBULATORY_CARE_PROVIDER_SITE_OTHER): Payer: 59 | Admitting: Family Medicine

## 2012-06-18 ENCOUNTER — Encounter: Payer: Self-pay | Admitting: Family Medicine

## 2012-06-18 VITALS — BP 130/80 | HR 81 | Temp 97.6°F | Wt 224.0 lb

## 2012-06-18 DIAGNOSIS — J329 Chronic sinusitis, unspecified: Secondary | ICD-10-CM

## 2012-06-18 DIAGNOSIS — E559 Vitamin D deficiency, unspecified: Secondary | ICD-10-CM

## 2012-06-18 DIAGNOSIS — N411 Chronic prostatitis: Secondary | ICD-10-CM

## 2012-06-18 MED ORDER — TEMAZEPAM 30 MG PO CAPS: 30.0000 mg | ORAL_CAPSULE | Freq: Every evening | ORAL | Status: DC | PRN

## 2012-06-18 MED ORDER — AZITHROMYCIN 250 MG PO TABS: ORAL_TABLET | ORAL | Status: DC

## 2012-06-18 MED ORDER — VITAMIN D (ERGOCALCIFEROL) 1.25 MG (50000 UNIT) PO CAPS: 50000.0000 [IU] | ORAL_CAPSULE | ORAL | Status: DC

## 2012-06-18 NOTE — Progress Notes (Signed)
  Subjective:    Patient ID: [name], male    DOB: 05-11-1951, 62 y.o.   MRN: [id number]  HPI Here for several things. First he has had sinus pressure, PND, and HA for one week. No fever or cough. Also he has been seeing a Insurance underwriter at [employer] for chronic prostatitis and he would like to get a second opinion. Lastly he is still fatigued all the time and he has some muscle aches on the arms and legs. We did labs recently that ruled out anemia or inflammatory conditions, but his vitamin D level was low. We originally advised him to take OTC vitamin D for this.    Review of Systems  Constitutional: Positive for fatigue.  HENT: Positive for congestion, postnasal drip and sinus pressure.   Eyes: Negative.   Respiratory: Negative.        Objective:   Physical Exam  Constitutional: He appears well-developed and well-nourished. No distress.  HENT:  Right Ear: External ear normal.  Left Ear: External ear normal.  Nose: Nose normal.  Mouth/Throat: Oropharynx is clear and moist. No oropharyngeal exudate.  Eyes: Conjunctivae normal are normal.  Pulmonary/Chest: Effort normal and breath sounds normal.  Lymphadenopathy:    He has no cervical adenopathy.          Assessment & Plan:  Treat the sinusitis with a Zpack. We will refer to [facility]. Begin 50,000 unit vitamin D capsules twice a week and check a level in 4 weeks. This may in fact be part of the etiology for his fatigue.

## 2012-06-28 ENCOUNTER — Other Ambulatory Visit: Payer: Self-pay | Admitting: Family Medicine

## 2012-06-28 NOTE — Telephone Encounter (Signed)
Pt need refill on temazepam 30 mg #30 with refills  call into [facility] [address]. Pt has enough to last until monday

## 2012-07-02 MED ORDER — TEMAZEPAM 30 MG PO CAPS: 30.0000 mg | ORAL_CAPSULE | Freq: Every evening | ORAL | Status: DC | PRN

## 2012-07-02 NOTE — Telephone Encounter (Signed)
Call in #30 with 5 rf

## 2012-07-02 NOTE — Telephone Encounter (Signed)
I called in script 

## 2012-07-12 ENCOUNTER — Ambulatory Visit (INDEPENDENT_AMBULATORY_CARE_PROVIDER_SITE_OTHER): Payer: 59 | Admitting: General Surgery

## 2012-07-12 ENCOUNTER — Encounter (INDEPENDENT_AMBULATORY_CARE_PROVIDER_SITE_OTHER): Payer: Self-pay | Admitting: General Surgery

## 2012-07-12 VITALS — BP 152/80 | HR 76 | Temp 97.1°F | Resp 20 | Ht 70.0 in | Wt 228.6 lb

## 2012-07-12 DIAGNOSIS — K432 Incisional hernia without obstruction or gangrene: Secondary | ICD-10-CM

## 2012-07-12 NOTE — Progress Notes (Signed)
Subjective:   incisional hernia  Patient ID: [name], male   DOB: [birth year], 61 y.o.   MRN: [id number]  HPI Patient returns to the office for treatment planning for his recurrent ventral incisional hernia. His surgical history is significant for laparoscopic right colectomy at [facility] followed by an anastomotic leak requiring ileostomy and subsequent ileostomy takedown. He then had repair of incisional hernia with mesh by Dr. Hassell Done here. I then operated on him for an incarcerated small bowel with perforation and used biologic mesh and he has a recurrence in his upper midline wound of his incisional hernia. He has been having some increasing episodes of pain at the hernia site which resolved after lying down. No nausea or vomiting. He is otherwise feeling well.  Past Medical History  Diagnosis Date  . Diverticulitis of colon   . Hepatitis 1975    unknown type   . Asthma   . GERD (gastroesophageal reflux disease)   . Hiatal hernia   . BPH (benign prostatic hypertrophy) with urinary obstruction   . Psoriasis     sees [name] at [facility].  . Ileus following gastrointestinal surgery 12/26/2011   Past Surgical History  Procedure Date  . Hemicolectomy     left side, at [facility]  . Ileostomy   . Colonoscopy   . Cystoscopy   . Bronchoscopy   . Tonsillectomy   . Vasectomy   . Hernia repair     incisional hernia  . Laparotomy 12/19/2011    Procedure: EXPLORATORY LAPAROTOMY;  Surgeon: [name], MD;  Location: [facility];  Service: General;  Laterality: N/A;  . Bowel resection 12/19/2011    Procedure: SMALL BOWEL RESECTION;  Surgeon: [name], MD;  Location: [facility];  Service: General;  Laterality: N/A;  with anastamosis and insertion mesh  . Colon surgery 01/2004   Current Outpatient Prescriptions  Medication Sig Dispense Refill  . azithromycin (ZITHROMAX) 250 MG tablet As directed  6 tablet  0  . HYDROcodone-acetaminophen (LORTAB 7.5) 7.5-500 MG per  tablet Take 1 tablet by mouth every 6 (six) hours as needed for pain.  50 tablet  0  . ibuprofen (ADVIL,MOTRIN) 100 MG tablet Take 100 mg by mouth as needed.      . polyethylene glycol powder (MIRALAX) powder Take 17 g by mouth daily.      . temazepam (RESTORIL) 30 MG capsule Take 1 capsule (30 mg total) by mouth at bedtime as needed for sleep.  30 capsule  5  . tetracycline (ACHROMYCIN,SUMYCIN) 250 MG capsule Take 250 mg by mouth 2 (two) times daily.       . Vitamin D, Ergocalciferol, (DRISDOL) 50000 UNITS CAPS Take 1 capsule (50,000 Units total) by mouth 2 (two) times a week.  8 capsule  2   Allergies  Allergen Reactions  . Shellfish Allergy     Throat stated to close       Review of Systems  Respiratory: Negative.   Cardiovascular: Negative.   Gastrointestinal: Positive for abdominal pain and abdominal distention. Negative for nausea and blood in stool.       Objective:   Physical Exam BP 152/80  Pulse 76  Temp 97.1 F (36.2 C) (Temporal)  Resp 20  Ht 5' 10"  (1.778 m)  Wt 228 lb 9.6 oz (103.692 kg)  BMI 32.80 kg/m2 General: Moderately overweight Caucasian male in no distress Skin: Warm and dry without rash or infection HEENT: No palpable masses or thyromegaly. Sclera nonicteric. Lungs:  Clear equal breath sounds bilaterally without increased work of breathing Cardiovascular: Regular rate and rhythm. No edema. No JVD. Abdomen: Long midline incision. There is a moderately large fairly discrete hernia to the right of midline in the upper abdomen. I expect the defect probably measures 5 or 6 cm. The remainder of his abdomen is soft and nontender. Extremities: No edema or joint swelling Neurologic: Alert and fully oriented. Gait normal.    Assessment:     Recurrent ventral incisional hernia which is very symptomatic.    Plan:     Open repair under general anesthesia with mesh. I discussed the nature of the procedure and expected recovery with the patient including risk of  anesthetic complications, bleeding, infection, recurrence and bowel injury. All his questions were answered he would like to get this scheduled as soon as possible.

## 2012-07-16 ENCOUNTER — Encounter: Payer: Self-pay | Admitting: Internal Medicine

## 2012-07-16 ENCOUNTER — Ambulatory Visit (INDEPENDENT_AMBULATORY_CARE_PROVIDER_SITE_OTHER): Payer: 59 | Admitting: Internal Medicine

## 2012-07-16 VITALS — BP 136/86 | HR 69 | Temp 98.0°F | Wt 228.0 lb

## 2012-07-16 DIAGNOSIS — J019 Acute sinusitis, unspecified: Secondary | ICD-10-CM | POA: Insufficient documentation

## 2012-07-16 DIAGNOSIS — K432 Incisional hernia without obstruction or gangrene: Secondary | ICD-10-CM

## 2012-07-16 DIAGNOSIS — N400 Enlarged prostate without lower urinary tract symptoms: Secondary | ICD-10-CM

## 2012-07-16 DIAGNOSIS — Z23 Encounter for immunization: Secondary | ICD-10-CM

## 2012-07-16 MED ORDER — AMOXICILLIN-POT CLAVULANATE 875-125 MG PO TABS: 1.0000 | ORAL_TABLET | Freq: Two times a day (BID) | ORAL | Status: DC

## 2012-07-16 NOTE — Progress Notes (Signed)
Chief Complaint  Patient presents with  . Facial Pain    Pt also complains of post nasal drip, night sweats and green mucus.  . Generalized Body Aches    HPI: Patient comes in today for SDA for  new problem evaluation. PCP NA today  Onset 5-6 days with ur congestion and pressure face pain increasing green nasal discharge .   Had sweats last pm.  Hx of sinusitis can get bettter with saline and decongestants but is to go in hosp Jan for important hernia surgery .  No cough cp sob at this time   Not getting better as usual and oss worse   Asks about flu vaccine also  ROS: See pertinent positives and negatives per HPI. No cp sob wheezing  bleeding abd pain at this time Hx of bowel surgery  And incarcerated hernia.  Past Medical History  Diagnosis Date  . Diverticulitis of colon   . Hepatitis 1975    unknown type   . Asthma   . GERD (gastroesophageal reflux disease)   . Hiatal hernia   . BPH (benign prostatic hypertrophy) with urinary obstruction   . Psoriasis     sees Dr. Karene Fry at [facility].  . Ileus following gastrointestinal surgery 12/26/2011    Family History  Problem Relation Age of Onset  . Cancer Mother     lung  . Cancer Father     leukemia  . Hypertension Father     History   Social History  . Marital Status: Married    Spouse Name: N/A    Number of Children: N/A  . Years of Education: N/A   Social History Main Topics  . Smoking status: Never Smoker   . Smokeless tobacco: Never Used  . Alcohol Use: Yes     Comment: once a month or less  . Drug Use: No  . Sexually Active: None   Other Topics Concern  . None   Social History Narrative  . None    Outpatient Encounter Prescriptions as of 07/16/2012  Medication Sig Dispense Refill  . HYDROcodone-acetaminophen (LORTAB 7.5) 7.5-500 MG per tablet Take 1 tablet by mouth every 6 (six) hours as needed for pain.  50 tablet  0  . ibuprofen (ADVIL,MOTRIN) 100 MG tablet Take 100 mg by mouth as needed.        . polyethylene glycol powder (MIRALAX) powder Take 17 g by mouth daily.      . temazepam (RESTORIL) 30 MG capsule Take 1 capsule (30 mg total) by mouth at bedtime as needed for sleep.  30 capsule  5  . Vitamin D, Ergocalciferol, (DRISDOL) 50000 UNITS CAPS Take 1 capsule (50,000 Units total) by mouth 2 (two) times a week.  8 capsule  2  . amoxicillin-clavulanate (AUGMENTIN) 875-125 MG per tablet Take 1 tablet by mouth every 12 (twelve) hours.  20 tablet  0  . [DISCONTINUED] azithromycin (ZITHROMAX) 250 MG tablet As directed  6 tablet  0  . [DISCONTINUED] tetracycline (ACHROMYCIN,SUMYCIN) 250 MG capsule Take 250 mg by mouth 2 (two) times daily.         EXAM:  BP 136/86  Pulse 69  Temp 98 F (36.7 C) (Oral)  Wt 228 lb (103.42 kg)  SpO2 97%  There is no height on file to calculate BMI.  GENERAL: vitals reviewed and listed above, alert, oriented, appears well hydrated and in no acute distress congested   quiet respirations; congested  somewhat hoarse. Non toxic . HEENT: Normocephalic ;atraumatic ,  Eyes;  PERRL, EOMs  Full, lids and conjunctiva clear,,Ears: no deformities, canals nl, TM landmarks normal, Nose: no deformity mucoid dc bilaterally   Congested;face maxillary  mildly  tender Mouth : OP clear without lesion or edema . Neck: Supple without adenopathy or masses or bruits Chest:  Clear to A&P without wheezes rales or rhonchi CV:  S1-S2 no gallops or murmurs peripheral perfusion is normal Skin :nl perfusion and no acute rashes  MS: moves all extremities without noticeable focal  abnormality  PSYCH: pleasant and cooperative, no obvious depression or anxiety  ASSESSMENT AND PLAN:  Discussed the following assessment and plan:  1. Acute sinusitis treated with antibiotics in the past 60 days    expectant managment augmentin ( has not had se before ) continue  other measusres  flu vaccnine today   2. BENIGN PROSTATIC HYPERTROPHY   3. Recurrent ventral incisional hernia   hernia  Hx  pending bowel surgery in January  Advise flu vaccine today.Counseled. -Patient advised to return or notify health care team  immediately if symptoms worsen or persist or new concerns arise.  Patient Instructions  Continue decongestants and saline and can add antibiotic for sinusitis.  Flu vaccine today is indicated.  Expect significant improvement in the next 3-5 days. Contact us if having a problem.   Sinusitis Sinusitis is redness, soreness, and swelling (inflammation) of the paranasal sinuses. Paranasal sinuses are air pockets within the bones of your face (beneath the eyes, the middle of the forehead, or above the eyes). In healthy paranasal sinuses, mucus is able to drain out, and air is able to circulate through them by way of your nose. However, when your paranasal sinuses are inflamed, mucus and air can become trapped. This can allow bacteria and other germs to grow and cause infection. Sinusitis can develop quickly and last only a short time (acute) or continue over a long period (chronic). Sinusitis that lasts for more than 12 weeks is considered chronic.  CAUSES  Causes of sinusitis include:  Allergies.  Structural abnormalities, such as displacement of the cartilage that separates your nostrils (deviated septum), which can decrease the air flow through your nose and sinuses and affect sinus drainage.  Functional abnormalities, such as when the small hairs (cilia) that line your sinuses and help remove mucus do not work properly or are not present. SYMPTOMS  Symptoms of acute and chronic sinusitis are the same. The primary symptoms are pain and pressure around the affected sinuses. Other symptoms include:  Upper toothache.  Earache.  Headache.  Bad breath.  Decreased sense of smell and taste.  A cough, which worsens when you are lying flat.  Fatigue.  Fever.  Thick drainage from your nose, which often is green and may contain pus (purulent).  Swelling and warmth  over the affected sinuses. DIAGNOSIS  Your caregiver will perform a physical exam. During the exam, your caregiver may:  Look in your nose for signs of abnormal growths in your nostrils (nasal polyps).  Tap over the affected sinus to check for signs of infection.  View the inside of your sinuses (endoscopy) with a special imaging device with a light attached (endoscope), which is inserted into your sinuses. If your caregiver suspects that you have chronic sinusitis, one or more of the following tests may be recommended:  Allergy tests.  Nasal culture A sample of mucus is taken from your nose and sent to a lab and screened for bacteria.  Nasal cytology A sample of mucus is  taken from your nose and examined by your caregiver to determine if your sinusitis is related to an allergy. TREATMENT  Most cases of acute sinusitis are related to a viral infection and will resolve on their own within 10 days. Sometimes medicines are prescribed to help relieve symptoms (pain medicine, decongestants, nasal steroid sprays, or saline sprays).  However, for sinusitis related to a bacterial infection, your caregiver will prescribe antibiotic medicines. These are medicines that will help kill the bacteria causing the infection.  Rarely, sinusitis is caused by a fungal infection. In theses cases, your caregiver will prescribe antifungal medicine. For some cases of chronic sinusitis, surgery is needed. Generally, these are cases in which sinusitis recurs more than 3 times per year, despite other treatments. HOME CARE INSTRUCTIONS   Drink plenty of water. Water helps thin the mucus so your sinuses can drain more easily.  Use a humidifier.  Inhale steam 3 to 4 times a day (for example, sit in the bathroom with the shower running).  Apply a warm, moist washcloth to your face 3 to 4 times a day, or as directed by your caregiver.  Use saline nasal sprays to help moisten and clean your sinuses.  Take  over-the-counter or prescription medicines for pain, discomfort, or fever only as directed by your caregiver. SEEK IMMEDIATE MEDICAL CARE IF:  You have increasing pain or severe headaches.  You have nausea, vomiting, or drowsiness.  You have swelling around your face.  You have vision problems.  You have a stiff neck.  You have difficulty breathing. MAKE SURE YOU:   Understand these instructions.  Will watch your condition.  Will get help right away if you are not doing well or get worse. Document Released: 07/03/2005 Document Revised: 09/25/2011 Document Reviewed: 07/18/2011 [facility] Patient Information 2013 [place], [place].      Neta Mends. Carman Auxier M.D.

## 2012-07-16 NOTE — Patient Instructions (Signed)
Continue decongestants and saline and can add antibiotic for sinusitis.  Flu vaccine today is indicated.  Expect significant improvement in the next 3-5 days. Contact us if having a problem.   Sinusitis Sinusitis is redness, soreness, and swelling (inflammation) of the paranasal sinuses. Paranasal sinuses are air pockets within the bones of your face (beneath the eyes, the middle of the forehead, or above the eyes). In healthy paranasal sinuses, mucus is able to drain out, and air is able to circulate through them by way of your nose. However, when your paranasal sinuses are inflamed, mucus and air can become trapped. This can allow bacteria and other germs to grow and cause infection. Sinusitis can develop quickly and last only a short time (acute) or continue over a long period (chronic). Sinusitis that lasts for more than 12 weeks is considered chronic.  CAUSES  Causes of sinusitis include:  Allergies.  Structural abnormalities, such as displacement of the cartilage that separates your nostrils (deviated septum), which can decrease the air flow through your nose and sinuses and affect sinus drainage.  Functional abnormalities, such as when the small hairs (cilia) that line your sinuses and help remove mucus do not work properly or are not present. SYMPTOMS  Symptoms of acute and chronic sinusitis are the same. The primary symptoms are pain and pressure around the affected sinuses. Other symptoms include:  Upper toothache.  Earache.  Headache.  Bad breath.  Decreased sense of smell and taste.  A cough, which worsens when you are lying flat.  Fatigue.  Fever.  Thick drainage from your nose, which often is green and may contain pus (purulent).  Swelling and warmth over the affected sinuses. DIAGNOSIS  Your caregiver will perform a physical exam. During the exam, your caregiver may:  Look in your nose for signs of abnormal growths in your nostrils (nasal polyps).  Tap over  the affected sinus to check for signs of infection.  View the inside of your sinuses (endoscopy) with a special imaging device with a light attached (endoscope), which is inserted into your sinuses. If your caregiver suspects that you have chronic sinusitis, one or more of the following tests may be recommended:  Allergy tests.  Nasal culture A sample of mucus is taken from your nose and sent to a lab and screened for bacteria.  Nasal cytology A sample of mucus is taken from your nose and examined by your caregiver to determine if your sinusitis is related to an allergy. TREATMENT  Most cases of acute sinusitis are related to a viral infection and will resolve on their own within 10 days. Sometimes medicines are prescribed to help relieve symptoms (pain medicine, decongestants, nasal steroid sprays, or saline sprays).  However, for sinusitis related to a bacterial infection, your caregiver will prescribe antibiotic medicines. These are medicines that will help kill the bacteria causing the infection.  Rarely, sinusitis is caused by a fungal infection. In theses cases, your caregiver will prescribe antifungal medicine. For some cases of chronic sinusitis, surgery is needed. Generally, these are cases in which sinusitis recurs more than 3 times per year, despite other treatments. HOME CARE INSTRUCTIONS   Drink plenty of water. Water helps thin the mucus so your sinuses can drain more easily.  Use a humidifier.  Inhale steam 3 to 4 times a day (for example, sit in the bathroom with the shower running).  Apply a warm, moist washcloth to your face 3 to 4 times a day, or as directed by your  caregiver.  Use saline nasal sprays to help moisten and clean your sinuses.  Take over-the-counter or prescription medicines for pain, discomfort, or fever only as directed by your caregiver. SEEK IMMEDIATE MEDICAL CARE IF:  You have increasing pain or severe headaches.  You have nausea, vomiting, or  drowsiness.  You have swelling around your face.  You have vision problems.  You have a stiff neck.  You have difficulty breathing. MAKE SURE YOU:   Understand these instructions.  Will watch your condition.  Will get help right away if you are not doing well or get worse. Document Released: 07/03/2005 Document Revised: 09/25/2011 Document Reviewed: 07/18/2011 [facility] Patient Information 2013 [place], [place].

## 2012-07-17 ENCOUNTER — Encounter: Payer: Self-pay | Admitting: Internal Medicine

## 2012-07-18 ENCOUNTER — Encounter (HOSPITAL_COMMUNITY): Payer: Self-pay | Admitting: Pharmacy Technician

## 2012-07-22 ENCOUNTER — Encounter (HOSPITAL_COMMUNITY): Payer: Self-pay

## 2012-07-22 ENCOUNTER — Encounter (HOSPITAL_COMMUNITY)
Admission: RE | Admit: 2012-07-22 | Discharge: 2012-07-22 | Disposition: A | Discharge status: Home / Self Care | Payer: 59 | Source: Ambulatory Visit | Attending: General Surgery | Admitting: General Surgery

## 2012-07-22 LAB — CBC
MCH: 29.4 pg (ref 26.0–34.0)
MCV: 86.9 fL (ref 78.0–100.0)
Platelets: 248 10*3/uL (ref 150–400)
RDW: 13.5 % (ref 11.5–15.5)
WBC: 10.7 10*3/uL — ABNORMAL HIGH (ref 4.0–10.5)

## 2012-07-22 LAB — SURGICAL PCR SCREEN: MRSA, PCR: NEGATIVE

## 2012-07-22 NOTE — Patient Instructions (Addendum)
20 EVEREST BROD  07/22/2012   Your procedure is scheduled on: 1-15  -2014  Report to [facility] at   1000     AM .  Call this number if you have problems the morning of surgery: [phone]  Or Presurgical Testing [phone](Anuj Summons)   Remember: Follow any bowel prep instructions per MD office. For Cpap use: Bring mask and tubing only.   Do not eat food:After Midnight.  May have clear liquids:up to 6 Hours before arrival. Nothing after : 0600 AM  Clear liquids include soda, tea, black coffee, apple or grape juice, broth.  Take these medicines the morning of surgery with A SIP OF WATER: Hydrocodone.   Do not wear jewelry, make-up or nail polish.  Do not wear lotions, powders, or perfumes. You may wear deodorant.  Do not shave 48 hours prior to surgery.(face and neck okay, no shaving of legs)  Do not bring valuables to the hospital.  Contacts, dentures or bridgework,body piercing,  may not be worn into surgery.  Leave suitcase in the car. After surgery it may be brought to your room.  For patients admitted to the hospital, checkout time is 11:00 AM the day of discharge.   Patients discharged the day of surgery will not be allowed to drive home. Must have responsible person with you x 24 hours once discharged.  Name and phone number of your driver: Dade Rodin, spouse [phone] cell  Special Instructions: CHG Shower Use Special Wash: see special instructions.(avoid face and genitals)   Please read over the following fact sheets that you were given: MRSA Information.   Failure to follow these instructions may result in Cancellation of your surgery.   Patient signature_______________________________________________________

## 2012-07-22 NOTE — Pre-Procedure Instructions (Signed)
07-22-12 EKG / CXR 6'13 -Epic

## 2012-07-31 ENCOUNTER — Encounter (HOSPITAL_COMMUNITY): Payer: Self-pay | Admitting: Anesthesiology

## 2012-07-31 ENCOUNTER — Ambulatory Visit (HOSPITAL_COMMUNITY): Payer: 59 | Admitting: Anesthesiology

## 2012-07-31 ENCOUNTER — Inpatient Hospital Stay (HOSPITAL_COMMUNITY)
Admission: RE | Admit: 2012-07-31 | Discharge: 2012-08-07 | DRG: 354 | Disposition: A | Discharge status: Home / Self Care | Payer: 59 | Source: Ambulatory Visit | Attending: General Surgery | Admitting: General Surgery

## 2012-07-31 ENCOUNTER — Encounter (HOSPITAL_COMMUNITY)
Admission: RE | Disposition: A | Discharge status: Home / Self Care | Payer: Self-pay | Source: Ambulatory Visit | Attending: General Surgery

## 2012-07-31 ENCOUNTER — Encounter (HOSPITAL_COMMUNITY): Payer: Self-pay | Admitting: *Deleted

## 2012-07-31 DIAGNOSIS — K432 Incisional hernia without obstruction or gangrene: Secondary | ICD-10-CM

## 2012-07-31 DIAGNOSIS — K560 Paralytic ileus: Secondary | ICD-10-CM | POA: Diagnosis not present

## 2012-07-31 DIAGNOSIS — J45909 Unspecified asthma, uncomplicated: Secondary | ICD-10-CM | POA: Diagnosis present

## 2012-07-31 DIAGNOSIS — L988 Other specified disorders of the skin and subcutaneous tissue: Secondary | ICD-10-CM | POA: Diagnosis not present

## 2012-07-31 DIAGNOSIS — K567 Ileus, unspecified: Secondary | ICD-10-CM

## 2012-07-31 DIAGNOSIS — Z79899 Other long term (current) drug therapy: Secondary | ICD-10-CM

## 2012-07-31 DIAGNOSIS — Z01812 Encounter for preprocedural laboratory examination: Secondary | ICD-10-CM

## 2012-07-31 DIAGNOSIS — K219 Gastro-esophageal reflux disease without esophagitis: Secondary | ICD-10-CM | POA: Diagnosis present

## 2012-07-31 DIAGNOSIS — Z9889 Other specified postprocedural states: Secondary | ICD-10-CM

## 2012-07-31 DIAGNOSIS — I498 Other specified cardiac arrhythmias: Secondary | ICD-10-CM | POA: Diagnosis not present

## 2012-07-31 DIAGNOSIS — K631 Perforation of intestine (nontraumatic): Secondary | ICD-10-CM

## 2012-07-31 DIAGNOSIS — I471 Supraventricular tachycardia: Secondary | ICD-10-CM

## 2012-07-31 DIAGNOSIS — E875 Hyperkalemia: Secondary | ICD-10-CM | POA: Diagnosis not present

## 2012-07-31 HISTORY — PX: VENTRAL HERNIA REPAIR: SHX424

## 2012-07-31 HISTORY — PX: INSERTION OF MESH: SHX5868

## 2012-07-31 SURGERY — REPAIR, HERNIA, VENTRAL
Anesthesia: General | Site: Abdomen | Wound class: Clean Contaminated

## 2012-07-31 MED ORDER — HYDROMORPHONE HCL PF 1 MG/ML IJ SOLN
INTRAMUSCULAR | Status: AC
  Filled 2012-07-31: qty 1

## 2012-07-31 MED ORDER — ONDANSETRON HCL 4 MG/2ML IJ SOLN: 4.0000 mg | Freq: Four times a day (QID) | INTRAMUSCULAR | Status: DC | PRN

## 2012-07-31 MED ORDER — LACTATED RINGERS IV SOLN
INTRAVENOUS | Status: DC | PRN
  Administered 2012-07-31 (×2): via INTRAVENOUS

## 2012-07-31 MED ORDER — HYDROMORPHONE 0.3 MG/ML IV SOLN
INTRAVENOUS | Status: AC
  Filled 2012-07-31: qty 25

## 2012-07-31 MED ORDER — LACTATED RINGERS IV SOLN: INTRAVENOUS | Status: DC

## 2012-07-31 MED ORDER — ACETAMINOPHEN 10 MG/ML IV SOLN
INTRAVENOUS | Status: AC
  Filled 2012-07-31: qty 100

## 2012-07-31 MED ORDER — HYDROMORPHONE HCL PF 1 MG/ML IJ SOLN
0.2500 mg | INTRAMUSCULAR | Status: DC | PRN
  Administered 2012-07-31 (×5): 0.5 mg via INTRAVENOUS

## 2012-07-31 MED ORDER — NEOSTIGMINE METHYLSULFATE 1 MG/ML IJ SOLN
INTRAMUSCULAR | Status: DC | PRN
  Administered 2012-07-31: 5 mg via INTRAVENOUS

## 2012-07-31 MED ORDER — CHLORHEXIDINE GLUCONATE 4 % EX LIQD: 1.0000 "application " | Freq: Once | CUTANEOUS | Status: DC

## 2012-07-31 MED ORDER — SUCCINYLCHOLINE CHLORIDE 20 MG/ML IJ SOLN
INTRAMUSCULAR | Status: DC | PRN
  Administered 2012-07-31: 140 mg via INTRAVENOUS

## 2012-07-31 MED ORDER — HYDRALAZINE HCL 20 MG/ML IJ SOLN
INTRAMUSCULAR | Status: AC
  Administered 2012-07-31: 5 mg
  Filled 2012-07-31: qty 1

## 2012-07-31 MED ORDER — OXYCODONE-ACETAMINOPHEN 5-325 MG PO TABS
1.0000 | ORAL_TABLET | ORAL | Status: DC | PRN
  Administered 2012-08-04 – 2012-08-07 (×12): 2 via ORAL
  Filled 2012-07-31 (×13): qty 2

## 2012-07-31 MED ORDER — CEFAZOLIN SODIUM-DEXTROSE 2-3 GM-% IV SOLR
INTRAVENOUS | Status: AC
  Filled 2012-07-31: qty 50

## 2012-07-31 MED ORDER — ACETAMINOPHEN 10 MG/ML IV SOLN
INTRAVENOUS | Status: DC | PRN
  Administered 2012-07-31: 1000 mg via INTRAVENOUS

## 2012-07-31 MED ORDER — CEFAZOLIN SODIUM-DEXTROSE 2-3 GM-% IV SOLR
2.0000 g | INTRAVENOUS | Status: AC
  Administered 2012-07-31: 2 g via INTRAVENOUS

## 2012-07-31 MED ORDER — GLYCOPYRROLATE 0.2 MG/ML IJ SOLN
INTRAMUSCULAR | Status: DC | PRN
  Administered 2012-07-31: 0.6 mg via INTRAVENOUS

## 2012-07-31 MED ORDER — 0.9 % SODIUM CHLORIDE (POUR BTL) OPTIME
TOPICAL | Status: DC | PRN
  Administered 2012-07-31: 1000 mL

## 2012-07-31 MED ORDER — LACTATED RINGERS IV SOLN
INTRAVENOUS | Status: DC
  Administered 2012-07-31: 1000 mL via INTRAVENOUS

## 2012-07-31 MED ORDER — FENTANYL CITRATE 0.05 MG/ML IJ SOLN
INTRAMUSCULAR | Status: DC | PRN
  Administered 2012-07-31 (×2): 100 ug via INTRAVENOUS
  Administered 2012-07-31: 50 ug via INTRAVENOUS

## 2012-07-31 MED ORDER — DIPHENHYDRAMINE HCL 12.5 MG/5ML PO ELIX: 12.5000 mg | ORAL_SOLUTION | Freq: Four times a day (QID) | ORAL | Status: DC | PRN

## 2012-07-31 MED ORDER — PROMETHAZINE HCL 25 MG/ML IJ SOLN: 6.2500 mg | INTRAMUSCULAR | Status: DC | PRN

## 2012-07-31 MED ORDER — ROCURONIUM BROMIDE 100 MG/10ML IV SOLN
INTRAVENOUS | Status: DC | PRN
  Administered 2012-07-31: 30 mg via INTRAVENOUS
  Administered 2012-07-31: 10 mg via INTRAVENOUS
  Administered 2012-07-31: 5 mg via INTRAVENOUS

## 2012-07-31 MED ORDER — KCL IN DEXTROSE-NACL 20-5-0.9 MEQ/L-%-% IV SOLN
INTRAVENOUS | Status: DC
  Administered 2012-07-31: 17:00:00 via INTRAVENOUS
  Administered 2012-08-01: 100 mL/h via INTRAVENOUS
  Filled 2012-07-31 (×3): qty 1000

## 2012-07-31 MED ORDER — ONDANSETRON HCL 4 MG/2ML IJ SOLN
4.0000 mg | Freq: Four times a day (QID) | INTRAMUSCULAR | Status: DC | PRN
  Administered 2012-07-31 – 2012-08-05 (×7): 4 mg via INTRAVENOUS
  Filled 2012-07-31 (×7): qty 2

## 2012-07-31 MED ORDER — SODIUM CHLORIDE 0.9 % IJ SOLN: 9.0000 mL | INTRAMUSCULAR | Status: DC | PRN

## 2012-07-31 MED ORDER — ONDANSETRON HCL 4 MG PO TABS: 4.0000 mg | ORAL_TABLET | Freq: Four times a day (QID) | ORAL | Status: DC | PRN

## 2012-07-31 MED ORDER — NALOXONE HCL 0.4 MG/ML IJ SOLN: 0.4000 mg | INTRAMUSCULAR | Status: DC | PRN

## 2012-07-31 MED ORDER — HYDROMORPHONE 0.3 MG/ML IV SOLN
INTRAVENOUS | Status: DC
  Administered 2012-07-31 (×2): via INTRAVENOUS
  Administered 2012-07-31: 1.2 mg via INTRAVENOUS
  Administered 2012-07-31: 4.13 mg via INTRAVENOUS
  Administered 2012-08-01: 23:00:00 via INTRAVENOUS
  Administered 2012-08-01: 0.9 mg via INTRAVENOUS
  Administered 2012-08-01: 08:00:00 via INTRAVENOUS
  Administered 2012-08-01: 5.4 mg via INTRAVENOUS
  Administered 2012-08-01: 2.7 mg via INTRAVENOUS
  Administered 2012-08-01 – 2012-08-02 (×2): 3.6 mg via INTRAVENOUS
  Administered 2012-08-02: 3.11 mg via INTRAVENOUS
  Administered 2012-08-02: 2.4 mg via INTRAVENOUS
  Administered 2012-08-02 (×2): via INTRAVENOUS
  Administered 2012-08-02 (×2): 3 mg via INTRAVENOUS
  Administered 2012-08-03: 4.41 mg via INTRAVENOUS
  Administered 2012-08-03: 0.9 mg via INTRAVENOUS
  Administered 2012-08-03: 3.6 mg via INTRAVENOUS
  Administered 2012-08-03 (×2): via INTRAVENOUS
  Administered 2012-08-03: 0.3 mL via INTRAVENOUS
  Administered 2012-08-03: 3 mg via INTRAVENOUS
  Administered 2012-08-03: 3.9 mg via INTRAVENOUS
  Administered 2012-08-04: 0.3 mg via INTRAVENOUS
  Administered 2012-08-04: 6 mg via INTRAVENOUS
  Administered 2012-08-04: 3.3 mg via INTRAVENOUS
  Administered 2012-08-04: 3.6 mg via INTRAVENOUS
  Administered 2012-08-04: 05:00:00 via INTRAVENOUS
  Filled 2012-07-31 (×10): qty 25

## 2012-07-31 MED ORDER — PROPOFOL 10 MG/ML IV BOLUS
INTRAVENOUS | Status: DC | PRN
  Administered 2012-07-31: 200 mg via INTRAVENOUS

## 2012-07-31 MED ORDER — DIPHENHYDRAMINE HCL 50 MG/ML IJ SOLN: 12.5000 mg | Freq: Four times a day (QID) | INTRAMUSCULAR | Status: DC | PRN

## 2012-07-31 MED ORDER — MIDAZOLAM HCL 5 MG/5ML IJ SOLN
INTRAMUSCULAR | Status: DC | PRN
  Administered 2012-07-31: 2 mg via INTRAVENOUS

## 2012-07-31 MED ORDER — HYDRALAZINE HCL 20 MG/ML IJ SOLN
20.0000 mg | Freq: Four times a day (QID) | INTRAMUSCULAR | Status: DC | PRN
  Administered 2012-07-31 (×2): 5 mg via INTRAVENOUS

## 2012-07-31 MED ORDER — ONDANSETRON HCL 4 MG/2ML IJ SOLN
INTRAMUSCULAR | Status: DC | PRN
  Administered 2012-07-31: 4 mg via INTRAVENOUS

## 2012-07-31 MED ORDER — KCL IN DEXTROSE-NACL 20-5-0.9 MEQ/L-%-% IV SOLN
INTRAVENOUS | Status: AC
  Filled 2012-07-31: qty 1000

## 2012-07-31 MED ORDER — HYDROMORPHONE HCL PF 1 MG/ML IJ SOLN
0.2500 mg | INTRAMUSCULAR | Status: DC | PRN
  Administered 2012-07-31 (×2): 0.5 mg via INTRAVENOUS

## 2012-07-31 MED ORDER — EPHEDRINE SULFATE 50 MG/ML IJ SOLN
INTRAMUSCULAR | Status: DC | PRN
  Administered 2012-07-31: 5 mg via INTRAVENOUS

## 2012-07-31 MED ORDER — HEPARIN SODIUM (PORCINE) 5000 UNIT/ML IJ SOLN
5000.0000 [IU] | Freq: Three times a day (TID) | INTRAMUSCULAR | Status: DC
  Administered 2012-07-31 – 2012-08-07 (×20): 5000 [IU] via SUBCUTANEOUS
  Filled 2012-07-31 (×23): qty 1

## 2012-07-31 MED ORDER — LIDOCAINE HCL (CARDIAC) 20 MG/ML IV SOLN
INTRAVENOUS | Status: DC | PRN
  Administered 2012-07-31: 80 mg via INTRAVENOUS

## 2012-07-31 SURGICAL SUPPLY — 46 items
BINDER ABD UNIV 12 45-62 (WOUND CARE) IMPLANT
BINDER ABDOMINAL 46IN 62IN (WOUND CARE) ×3
BLADE EXTENDED COATED 6.5IN (ELECTRODE) IMPLANT
BLADE HEX COATED 2.75 (ELECTRODE) ×3 IMPLANT
CANISTER SUCTION 2500CC (MISCELLANEOUS) ×3 IMPLANT
CLOTH BEACON ORANGE TIMEOUT ST (SAFETY) ×3 IMPLANT
DECANTER SPIKE VIAL GLASS SM (MISCELLANEOUS) IMPLANT
DEVICE SECURE STRAP 25 ABSORB (INSTRUMENTS) ×1 IMPLANT
DRAIN CHANNEL 19F RND (DRAIN) ×1 IMPLANT
DRAPE INCISE IOBAN 66X45 STRL (DRAPES) ×3 IMPLANT
DRAPE LAPAROTOMY T 102X78X121 (DRAPES) ×3 IMPLANT
DRSG PAD ABDOMINAL 8X10 ST (GAUZE/BANDAGES/DRESSINGS) ×2 IMPLANT
ELECT REM PT RETURN 9FT ADLT (ELECTROSURGICAL) ×3
ELECTRODE REM PT RTRN 9FT ADLT (ELECTROSURGICAL) ×2 IMPLANT
EVACUATOR SILICONE 100CC (DRAIN) ×1 IMPLANT
GLOVE BIOGEL PI IND STRL 7.0 (GLOVE) ×2 IMPLANT
GLOVE BIOGEL PI INDICATOR 7.0 (GLOVE) ×1
GOWN STRL NON-REIN LRG LVL3 (GOWN DISPOSABLE) ×3 IMPLANT
GOWN STRL REIN XL XLG (GOWN DISPOSABLE) ×6 IMPLANT
KIT BASIN OR (CUSTOM PROCEDURE TRAY) ×3 IMPLANT
MESH VENT ST 19.6X24.6CM OVL (Mesh General) ×1 IMPLANT
NDL HYPO 25X1 1.5 SAFETY (NEEDLE) IMPLANT
NEEDLE HYPO 25X1 1.5 SAFETY (NEEDLE) IMPLANT
NS IRRIG 1000ML POUR BTL (IV SOLUTION) ×3 IMPLANT
PACK GENERAL/GYN (CUSTOM PROCEDURE TRAY) ×3 IMPLANT
SPONGE GAUZE 4X4 12PLY (GAUZE/BANDAGES/DRESSINGS) ×3 IMPLANT
SPONGE LAP 4X18 X RAY DECT (DISPOSABLE) IMPLANT
STAPLER VISISTAT 35W (STAPLE) ×3 IMPLANT
SUT ETHILON 2 0 PS N (SUTURE) ×1 IMPLANT
SUT NOVA NAB DX-16 0-1 5-0 T12 (SUTURE) ×3 IMPLANT
SUT NOVA NAB GS-21 0 18 T12 DT (SUTURE) ×4 IMPLANT
SUT PDS AB 1 CTX 36 (SUTURE) IMPLANT
SUT PROLENE 0 CT 1 30 (SUTURE) IMPLANT
SUT PROLENE 0 CT 1 CR/8 (SUTURE) IMPLANT
SUT SILK 2 0 (SUTURE)
SUT SILK 2 0 SH CR/8 (SUTURE) IMPLANT
SUT SILK 2-0 18XBRD TIE 12 (SUTURE) IMPLANT
SUT SILK 3 0 (SUTURE)
SUT SILK 3-0 18XBRD TIE 12 (SUTURE) IMPLANT
SUT VIC AB 3-0 CT1 36 (SUTURE) ×1 IMPLANT
SUT VIC AB 3-0 SH 27 (SUTURE)
SUT VIC AB 3-0 SH 27XBRD (SUTURE) IMPLANT
SYR CONTROL 10ML LL (SYRINGE) IMPLANT
TAPE CLOTH SURG 6X10 WHT LF (GAUZE/BANDAGES/DRESSINGS) ×1 IMPLANT
TOWEL OR 17X26 10 PK STRL BLUE (TOWEL DISPOSABLE) ×4 IMPLANT
TRAY FOLEY CATH 14FRSI W/METER (CATHETERS) ×1 IMPLANT

## 2012-07-31 NOTE — H&P (Signed)
HPI  Patient is admitted for repair of his recurrent ventral incisional hernia. His surgical history is significant for laparoscopic right colectomy at [facility] followed by an anastomotic leak requiring ileostomy and subsequent ileostomy takedown. He then had repair of incisional hernia with mesh by Dr. Hassell Done here. I then operated on him for an incarcerated small bowel with perforation and used biologic mesh and he has a recurrence in his upper midline wound of his incisional hernia. He has been having some increasing episodes of pain at the hernia site which resolved after lying down. No nausea or vomiting. He is otherwise feeling well.   Past Medical History   Diagnosis  Date   .  Diverticulitis of colon    .  Hepatitis  1975     unknown type   .  Asthma    .  GERD (gastroesophageal reflux disease)    .  Hiatal hernia    .  BPH (benign prostatic hypertrophy) with urinary obstruction    .  Psoriasis      sees [name] at [facility].   .  Ileus following gastrointestinal surgery  12/26/2011    Past Surgical History   Procedure  Date   .  Hemicolectomy      left side, at [facility]   .  Ileostomy    .  Colonoscopy    .  Cystoscopy    .  Bronchoscopy    .  Tonsillectomy    .  Vasectomy    .  Hernia repair      incisional hernia   .  Laparotomy  12/19/2011     Procedure: EXPLORATORY LAPAROTOMY; Surgeon: [name], MD; Location: [facility] ORS; Service: General; Laterality: N/A;   .  Bowel resection  12/19/2011     Procedure: SMALL BOWEL RESECTION; Surgeon: [name], MD; Location: [facility] ORS; Service: General; Laterality: N/A; with anastamosis and insertion mesh   .  Colon surgery  01/2004    Current Outpatient Prescriptions   Medication  Sig  Dispense  Refill   .  azithromycin (ZITHROMAX) 250 MG tablet  As directed  6 tablet  0   .  HYDROcodone-acetaminophen (LORTAB 7.5) 7.5-500 MG per tablet  Take 1 tablet by mouth every 6 (six) hours as needed for pain.  50 tablet   0   .  ibuprofen (ADVIL,MOTRIN) 100 MG tablet  Take 100 mg by mouth as needed.     .  polyethylene glycol powder (MIRALAX) powder  Take 17 g by mouth daily.     .  temazepam (RESTORIL) 30 MG capsule  Take 1 capsule (30 mg total) by mouth at bedtime as needed for sleep.  30 capsule  5   .  tetracycline (ACHROMYCIN,SUMYCIN) 250 MG capsule  Take 250 mg by mouth 2 (two) times daily.     .  Vitamin D, Ergocalciferol, (DRISDOL) 50000 UNITS CAPS  Take 1 capsule (50,000 Units total) by mouth 2 (two) times a week.  8 capsule  2    Allergies   Allergen  Reactions   .  Shellfish Allergy      Throat stated to close      Review of Systems  Respiratory: Negative.  Cardiovascular: Negative.  Gastrointestinal: Positive for abdominal pain and abdominal distention. Negative for nausea and blood in stool.    Objective:   Physical Exam  BP 162/89  Pulse 77  Temp 97.5 F (36.4 C) (Oral)  Resp 18  SpO2 98%  General: Moderately overweight Caucasian male in no distress  Skin: Warm and dry without rash or infection  HEENT: No palpable masses or thyromegaly. Sclera nonicteric.  Lungs: Clear equal breath sounds bilaterally without increased work of breathing  Cardiovascular: Regular rate and rhythm. No edema. No JVD.  Abdomen: Long midline incision. There is a moderately large fairly discrete hernia to the right of midline in the upper abdomen. I expect the defect probably measures 5 or 6 cm. The remainder of his abdomen is soft and nontender.  Extremities: No edema or joint swelling  Neurologic: Alert and fully oriented. Gait normal.   Assessment:    Recurrent ventral incisional hernia which is very symptomatic.   Plan:    Open repair under general anesthesia with mesh. I discussed the nature of the procedure and expected recovery with the patient including risk of anesthetic complications, bleeding, infection, recurrence and bowel injury. All his questions were answered.

## 2012-07-31 NOTE — Anesthesia Preprocedure Evaluation (Addendum)
Anesthesia Evaluation  Patient identified by MRN, date of birth, ID band Patient awake    Reviewed: Allergy & Precautions, H&P , NPO status , Patient's Chart, lab work & pertinent test results  History of Anesthesia Complications Negative for: history of anesthetic complications  Airway Mallampati: III TM Distance: >3 FB Neck ROM: Full    Dental  (+) Teeth Intact, Poor Dentition and Dental Advisory Given   Pulmonary shortness of breath and at rest, asthma ,  breath sounds clear to auscultation  Pulmonary exam normal       Cardiovascular negative cardio ROS  Rhythm:Regular Rate:Normal     Neuro/Psych ADHD  Neuromuscular disease    GI/Hepatic hiatal hernia, GERD-  Medicated,(+) Hepatitis -, Unspecified  Endo/Other  Morbid obesity  Renal/GU negative Renal ROS  negative genitourinary   Musculoskeletal negative musculoskeletal ROS (+)   Abdominal (+) + obese,   Peds  Hematology negative hematology ROS (+)   Anesthesia Other Findings   Reproductive/Obstetrics negative OB ROS                          Anesthesia Physical Anesthesia Plan  ASA: II  Anesthesia Plan: General   Post-op Pain Management:    Induction: Intravenous  Airway Management Planned: Oral ETT  Additional Equipment:   Intra-op Plan:   Post-operative Plan: Extubation in OR  Informed Consent: I have reviewed the patients History and Physical, chart, labs and discussed the procedure including the risks, benefits and alternatives for the proposed anesthesia with the patient or authorized representative who has indicated his/her understanding and acceptance.   Dental advisory given  Plan Discussed with: CRNA  Anesthesia Plan Comments:         Anesthesia Quick Evaluation

## 2012-07-31 NOTE — Transfer of Care (Signed)
Immediate Anesthesia Transfer of Care Note  Patient: [name]  Procedure(s) Performed: Procedure(s) (LRB) with comments: HERNIA REPAIR VENTRAL ADULT (N/A) INSERTION OF MESH ()  Patient Location: PACU  Anesthesia Type:General  Level of Consciousness: awake, alert , oriented and patient cooperative  Airway & Oxygen Therapy: Patient Spontanous Breathing and Patient connected to face mask oxygen  Post-op Assessment: Report given to PACU RN, Post -op Vital signs reviewed and stable and Patient moving all extremities X 4  Post vital signs: Reviewed and stable  Complications: No apparent anesthesia complications

## 2012-07-31 NOTE — Anesthesia Postprocedure Evaluation (Signed)
Anesthesia Post Note  Patient: [name]  Procedure(s) Performed: Procedure(s) (LRB): HERNIA REPAIR VENTRAL ADULT (N/A) INSERTION OF MESH ()  Anesthesia type: General  Patient location: PACU  Post pain: Pain level controlled  Post assessment: Post-op Vital signs reviewed  Last Vitals:  Filed Vitals:   07/31/12 1700  BP: 176/87  Pulse: 86  Temp:   Resp: 11    Post vital signs: Reviewed  Level of consciousness: sedated  Complications: No apparent anesthesia complications

## 2012-07-31 NOTE — Op Note (Signed)
Preoperative Diagnosis: recurrent ventral incisional hernia  Postoprative Diagnosis: recurrent ventral incisional hernia  Procedure: Procedure(s): HERNIA REPAIR VENTRAL ADULT   Surgeon: Excell Seltzer T   Assistants: Donnie Mesa  Anesthesia:  General endotracheal anesthesiaDiagnos  Indications:   Patient is a 62 year old male with multiple previous abdominal surgeries including ventral hernia repair. Within the last year he had small bowel perforation in an incarcerated ventral hernia requiring small bowel resection and abdominal closure with biologic material. He has an expected recurrent midabdominal ventral incisional hernia which is highly symptomatic. After discussion of options and risks detailed extensively elsewhere he is brought to the operating room for repair.  Procedure Detail:  Patient is brought to the operating room, placed in the supine position the operating table, and general endotracheal anesthesia induced. Foley catheter was placed. He received broad-spectrum IV antibiotics. The abdomen was widely sterilely prepped and draped. Patient timeout was performed the correct procedure verified. The previous midline incision skirting the umbilicus was used excising the old scar and dissecting down to the midline fascia. The fascia was carefully opened and there were adherent loops of small bowel beneath this but the adhesions were. Filmy and easily dissected down with blunt and sharp dissection. The midline incision was opened along its length. There was a large defect extending from the midline out into the right mid abdominal wall. All adhesions were taken down extensively out to the flanks up to the xiphoid and out of the pelvis. There were filmy interloop adhesions and I did not dissect out the bowel. The entire anterior abdominal wall was however cleared. In order to bring the fascia to the midline I raised a fairly extensive skin and subcutaneous flap on the right side and  then a relaxing incision was made in the external oblique lateral to the rectus sheath which gave [place] another several centimeters of mobility I was able then to bring the muscular abdominal wall to the midline. On the left side I also raised somewhat shorter skin and subcutaneous flap. The majority of the left side of the abdomen was reinforced with an intraperitoneal piece of proceed mesh which was well incorporated and this was left in place. I then used a 24 x 20 cm piece of VentralO mesh which was placed intraperitoneally with very broad coverage out to both flanks and up toward the xiphoid and down into the pelvis. The mesh was sutured intraperitoneally with interrupted 0 Novafil taking deep bites of the abdominal wall and incorporating the outer ring of the mesh. After was sutured circumferentially I then used the Securestrap tacker in the cuff of the mesh to further secure it circumferentially until they were palpable. Following this the abdominal wall was brought back to the midline and closed with interrupted 0 Novafil without undue tension. The wound was thoroughly irrigated. A 19 Blake drain was left through separate stab wound beneath the skin and subcutaneous flaps. The subcutaneous was closed with running 3-0 Vicryl the skin was closed with staples. Sponge needle and the counts were correct.  Findings: As above  Estimated Blood Loss:  less than 50 mL         Drains: 19 round Blake drain  Blood Given: none          Specimens: none        Complications:  * No complications entered in OR log *         Disposition: PACU - hemodynamically stable.         Condition: stable  [name] MD, FACS  07/31/2012, 3:44 PM

## 2012-07-31 NOTE — Preoperative (Signed)
Beta Blockers   Reason not to administer Beta Blockers:Not Applicable 

## 2012-08-01 ENCOUNTER — Encounter (HOSPITAL_COMMUNITY): Payer: Self-pay | Admitting: General Surgery

## 2012-08-01 LAB — BASIC METABOLIC PANEL
BUN: 19 mg/dL (ref 6–23)
CO2: 20 mEq/L (ref 19–32)
Chloride: 102 mEq/L (ref 96–112)
Glucose, Bld: 166 mg/dL — ABNORMAL HIGH (ref 70–99)
Potassium: 7.1 mEq/L (ref 3.5–5.1)

## 2012-08-01 LAB — CBC
HCT: 44.1 % (ref 39.0–52.0)
Hemoglobin: 14.3 g/dL (ref 13.0–17.0)
RBC: 4.85 MIL/uL (ref 4.22–5.81)
WBC: 28.1 10*3/uL — ABNORMAL HIGH (ref 4.0–10.5)

## 2012-08-01 LAB — POTASSIUM
Potassium: 5.5 mEq/L — ABNORMAL HIGH (ref 3.5–5.1)
Potassium: 5.2 mEq/L — ABNORMAL HIGH (ref 3.5–5.1)
Potassium: 4.9 mEq/L (ref 3.5–5.1)

## 2012-08-01 MED ORDER — DEXTROSE 50 % IV SOLN
1.0000 | Freq: Once | INTRAVENOUS | Status: AC
  Administered 2012-08-01: 50 mL via INTRAVENOUS
  Filled 2012-08-01: qty 50

## 2012-08-01 MED ORDER — GUAIFENESIN-DM 100-10 MG/5ML PO SYRP
10.0000 mL | ORAL_SOLUTION | ORAL | Status: DC | PRN
  Administered 2012-08-01 – 2012-08-02 (×2): 10 mL via ORAL
  Filled 2012-08-01 (×2): qty 10

## 2012-08-01 MED ORDER — SODIUM POLYSTYRENE SULFONATE 15 GM/60ML PO SUSP
15.0000 g | Freq: Once | ORAL | Status: AC
  Administered 2012-08-01: 15 g via ORAL
  Filled 2012-08-01: qty 60

## 2012-08-01 MED ORDER — ALUM & MAG HYDROXIDE-SIMETH 200-200-20 MG/5ML PO SUSP
15.0000 mL | Freq: Four times a day (QID) | ORAL | Status: DC | PRN
  Administered 2012-08-01 – 2012-08-02 (×2): 15 mL via ORAL
  Filled 2012-08-01 (×2): qty 30

## 2012-08-01 MED ORDER — INSULIN ASPART 100 UNIT/ML IV SOLN
10.0000 [IU] | Freq: Once | INTRAVENOUS | Status: AC
  Administered 2012-08-01: 10 [IU] via INTRAVENOUS

## 2012-08-01 MED ORDER — SODIUM CHLORIDE 0.9 % IV SOLN
INTRAVENOUS | Status: DC
  Administered 2012-08-01 – 2012-08-03 (×5): via INTRAVENOUS

## 2012-08-01 MED ORDER — SODIUM BICARBONATE 8.4 % IV SOLN
50.0000 meq | Freq: Once | INTRAVENOUS | Status: AC
  Administered 2012-08-01: 50 meq via INTRAVENOUS
  Filled 2012-08-01: qty 50

## 2012-08-01 NOTE — Progress Notes (Signed)
Report called and given to [facility] Stanford Breed RN 08-01-2012 11:39am

## 2012-08-01 NOTE — Progress Notes (Signed)
MD Gross Notified of the potassium level of 7.2, orders received and entered Stanford Breed RN 08-01-2012 8:08am

## 2012-08-01 NOTE — Progress Notes (Signed)
Critical Lab result - Potassium 7.1. DR Cornett notified and order received to repeat Potassium.

## 2012-08-01 NOTE — Progress Notes (Signed)
   CARE MANAGEMENT NOTE 08/01/2012  Patient:  [name], [name]   Account Number:  [id number]  Date Initiated:  08/01/2012  Documentation initiated by:  Jiles Crocker  Subjective/Objective Assessment:   ADMITTED FOR SURGERY - HERNIA REPAIR VENTRAL ADULT  INSERTION OF MESH     Action/Plan:   LIVES AT HOME WITH SPOUSE; CM FOLLOWING FOR DCP   Anticipated DC Date:  08/08/2012   Anticipated DC Plan:  HOME/SELF CARE      DC Planning Services  CM consult          Status of service:  In process, will continue to follow Medicare Important Message given?  NA - LOS <3 / Initial given by admissions (If response is "NO", the following Medicare IM given date fields will be blank)  Per UR Regulation:  Reviewed for med. necessity/level of care/duration of stay  Comments:  08/01/2012- B Shalicia Craghead RN,BSN,MHA

## 2012-08-01 NOTE — Progress Notes (Signed)
Patient ID: [name], male   DOB: December 26, 1950, 62 y.o.   MRN: [id number] 1 Day Post-Op  Subjective: Moderate pain but controlled with medications. The Foley catheter replaced as he was unable to void. Mild nausea when he was sitting up.  Objective: Vital signs in last 24 hours: Temp:  [97.2 F (36.2 C)-98.6 F (37 C)] 97.4 F (36.3 C) (01/16 0615) Pulse Rate:  [72-132] 121  (01/16 0615) Resp:  [10-18] 16  (01/16 0615) BP: (106-215)/(67-105) 106/77 mmHg (01/16 0615) SpO2:  [91 %-100 %] 94 % (01/16 0615) Weight:  [227 lb (102.967 kg)] 227 lb (102.967 kg) (01/15 1824) Last BM Date: 07/31/12  Intake/Output from previous day: 01/15 0701 - 01/16 0700 In: 2591.7 [P.O.:480; I.V.:2111.7] Out: 1360 [Urine:1200; Drains:150; Blood:10] Intake/Output this shift:    General appearance: alert, cooperative and no distress Resp: clear to auscultation bilaterally GI: ffirm but mild appropriate postoperative tenderness. Incision/Wound: ddressing clean and dry Foley in place draining clear urine  Lab Results:   Basename 08/01/12 0422  WBC 28.1*  HGB 14.3  HCT 44.1  PLT 399   BMET  Basename 08/01/12 0655 08/01/12 0422  NA -- 137  K 7.2* 7.1*  CL -- 102  CO2 -- 20  GLUCOSE -- 166*  BUN -- 19  CREATININE -- 2.13*  CALCIUM -- 7.8*     Studies/Results: No results found.  Anti-infectives: Anti-infectives     Start     Dose/Rate Route Frequency Ordered Stop   07/31/12 0955   ceFAZolin (ANCEF) IVPB 2 g/50 mL premix        2 g 100 mL/hr over 30 Minutes Intravenous On call to O.R. 07/31/12 0955 07/31/12 1355          Assessment/Plan: s/p Procedure(s): HERNIA REPAIR VENTRAL ADULT INSERTION OF MESH Marked hyperkalemia. His white count is also very high and creatinine is elevated. Source of this is not clear. He does not appear ill and is having appropriate amount of pain. Urine output is adequate and appears clear He was on potassium replacement until one week prior to  surgery for hypokalemia. EKG is unremarkable. IV fluids had been changed and he is receiving insulin and glucose and a bicarbonate. Kayexalate have been ordered. He will be transferred to a monitored bed. Follow urine output and potassium and renal function closely.   LOS: 1 day    Macen Joslin T 08/01/2012

## 2012-08-02 LAB — BASIC METABOLIC PANEL
CO2: 26 mEq/L (ref 19–32)
Calcium: 8.2 mg/dL — ABNORMAL LOW (ref 8.4–10.5)
Chloride: 108 mEq/L (ref 96–112)
GFR calc Af Amer: 65 mL/min — ABNORMAL LOW (ref >90)
Sodium: 142 mEq/L (ref 135–145)

## 2012-08-02 LAB — CBC
MCH: 29.6 pg (ref 26.0–34.0)
Platelets: 228 10*3/uL (ref 150–400)
RBC: 3.72 MIL/uL — ABNORMAL LOW (ref 4.22–5.81)
RDW: 14 % (ref 11.5–15.5)
WBC: 19.9 10*3/uL — ABNORMAL HIGH (ref 4.0–10.5)

## 2012-08-02 LAB — EXPECTORATED SPUTUM ASSESSMENT W GRAM STAIN, RFLX TO RESP C

## 2012-08-02 NOTE — Progress Notes (Signed)
Patient ID: [name], male   DOB: Feb 06, 1951, 62 y.o.   MRN: [id number] 2 Days Post-Op  Subjective: Feels better today.  Less pain.  Mild nausea with CL diet  No flatus  Objective: Vital signs in last 24 hours: Temp:  [98.3 F (36.8 C)-99.6 F (37.6 C)] 98.5 F (36.9 C) (01/17 0657) Pulse Rate:  [109-119] 110  (01/17 0657) Resp:  [8-18] 11  (01/17 0749) BP: (110-142)/(63-79) 124/68 mmHg (01/17 0657) SpO2:  [90 %-98 %] 90 % (01/17 0749) Weight:  [225 lb (102.059 kg)] 225 lb (102.059 kg) (01/16 1153) Last BM Date: 07/31/12  Intake/Output from previous day: 01/16 0701 - 01/17 0700 In: 367.5 [I.V.:367.5] Out: 1505 [Urine:1145; Drains:360] Intake/Output this shift:    General appearance: alert, cooperative and no distress Resp: Mild upper airway conjestion GI: normal findings: soft, non-tender Incision/Wound: Dressing clean and dry JP serosanguinous, 20cc last shift  Lab Results:   Basename 08/02/12 0340 08/01/12 0422  WBC 19.9* 28.1*  HGB 11.0* 14.3  HCT 34.2* 44.1  PLT 228 399   BMET  Basename 08/02/12 0340 08/02/12 0030 08/01/12 0422  NA 142 -- 137  K 4.6 4.9 --  CL 108 -- 102  CO2 26 -- 20  GLUCOSE 129* -- 166*  BUN 27* -- 19  CREATININE 1.35 -- 2.13*  CALCIUM 8.2* -- 7.8*     Studies/Results: No results found.  Anti-infectives: Anti-infectives     Start     Dose/Rate Route Frequency Ordered Stop   07/31/12 0955   ceFAZolin (ANCEF) IVPB 2 g/50 mL premix        2 g 100 mL/hr over 30 Minutes Intravenous On call to O.R. 07/31/12 0955 07/31/12 1355          Assessment/Plan: s/p Procedure(s): HERNIA REPAIR VENTRAL ADULT INSERTION OF MESH Severe hyperkalemia and elevated creatinine immediately post op, ? Etiology?, now resolved Expected ileus Overall improved Increase activity.  CL diet until nausea resolves Check lab in AM   LOS: 2 days    Isidra Mings T 08/02/2012

## 2012-08-03 DIAGNOSIS — I471 Supraventricular tachycardia: Secondary | ICD-10-CM

## 2012-08-03 LAB — BASIC METABOLIC PANEL
Calcium: 8.3 mg/dL — ABNORMAL LOW (ref 8.4–10.5)
GFR calc Af Amer: >90 mL/min (ref >90)
GFR calc non Af Amer: 78 mL/min — ABNORMAL LOW (ref >90)
GFR calc non Af Amer: >90 mL/min (ref >90)
Glucose, Bld: 107 mg/dL — ABNORMAL HIGH (ref 70–99)
Potassium: 4.8 mEq/L (ref 3.5–5.1)
Sodium: 144 mEq/L (ref 135–145)
Sodium: 144 mEq/L (ref 135–145)

## 2012-08-03 LAB — CBC
Hemoglobin: 10.3 g/dL — ABNORMAL LOW (ref 13.0–17.0)
MCH: 29.3 pg (ref 26.0–34.0)
MCHC: 31.4 g/dL (ref 30.0–36.0)

## 2012-08-03 MED ORDER — DILTIAZEM LOAD VIA INFUSION
10.0000 mg | Freq: Once | INTRAVENOUS | Status: AC
  Administered 2012-08-03: 10 mg via INTRAVENOUS
  Filled 2012-08-03: qty 10

## 2012-08-03 MED ORDER — DILTIAZEM HCL 100 MG IV SOLR
5.0000 mg/h | INTRAVENOUS | Status: DC
  Administered 2012-08-03: 15 mg/h via INTRAVENOUS
  Administered 2012-08-03: 10 mg/h via INTRAVENOUS
  Filled 2012-08-03: qty 100

## 2012-08-03 NOTE — Progress Notes (Signed)
Patient ID: [name], male   DOB: May 24, 1951, 62 y.o.   MRN: [id number]  Called to see the patient due to heart rate in 180s.  He is asymptomatic. Denies chest pain. He has some mild shortness of breath which has been present since surgery. He relates a history of tachycardia after previous surgery. He has had a previous workup for chest pain that was negative per patient.  Exam: BP 104/80  Pulse 190  Temp 99 F (37.2 C) (Oral)  Resp 11  Ht 5' 10"  (1.778 m)  Wt 225 lb (102.059 kg)  BMI 32.28 kg/m2  SpO2 95% General: Alert and in no distress Lungs: Clear equal breath sounds without increased work of breathing Cardiac: Tachycardia. No edema  EKG: Shows SVT about 190.  Assessment and plan: SVT, question atrial fibrillation. I have spoken to cardiology( Dr. Wynonia Lawman) is kindly agreed to evaluate the patient. We will start Cardizem bolus and drip Electrolytes were normal this morning. Repeat be met pending. Discussed with patient and family.

## 2012-08-03 NOTE — Consult Note (Signed)
Reason for Consult: Supraventricular tachycardia  Referring Physician: Dr. Glenna Fellows  [name] is an 62 y.o. male.   HPI: 62 y/o male currently being seen in consultation with Dr. Johna Sheriff for evaluation of Supraventricular tachycardia.  The patient is 3 days s/p ventral hernia repair and developed palpitation about 6:30 pm today that was associated with dizziness.  His initial ECG showed SVT with a ventricular rate of 189 bpm, and he was started on Cardizem drip. He is currently in sinus tachycardia and is  asymptomatic with a heart rate of 105 to 110bpm. He has several episodes of SVTs in the past, and they have all occurred and resolved in the peri-operative period after previous abdominal surgeries.  He has no prior history of CAD or CHF.  His prior cardiac evaluation include a nuclear stress test 09/06/2008 which showed no evidence of reversible ischemia, LVEF 57%.  He is currently clinically and hemodynamically stable.   Past Medical History  Diagnosis Date  . Diverticulitis of colon   . Hepatitis 1975    unknown type   . Asthma   . GERD (gastroesophageal reflux disease)   . Hiatal hernia   . BPH (benign prostatic hypertrophy) with urinary obstruction   . Psoriasis     sees Dr. Karene Fry at [facility].  . Ileus following gastrointestinal surgery 12/26/2011    Past Surgical History  Procedure Date  . Hemicolectomy     left side, at [facility]  . Ileostomy   . Colonoscopy   . Cystoscopy   . Bronchoscopy   . Tonsillectomy   . Vasectomy   . Laparotomy 12/19/2011    Procedure: EXPLORATORY LAPAROTOMY;  Surgeon: Mariella Saa, MD;  Location: [facility];  Service: General;  Laterality: N/A;  . Bowel resection 12/19/2011    Procedure: SMALL BOWEL RESECTION;  Surgeon: Mariella Saa, MD;  Location: [facility];  Service: General;  Laterality: N/A;  with anastamosis and insertion mesh  . Colon surgery 01/2004  . Hernia repair     [phone] incisional hernia  . Ileostomy  closure   . Ventral hernia repair 07/31/2012    Procedure: HERNIA REPAIR VENTRAL ADULT;  Surgeon: Mariella Saa, MD;  Location: [facility];  Service: General;  Laterality: N/A;  . Insertion of mesh 07/31/2012    Procedure: INSERTION OF MESH;  Surgeon: Mariella Saa, MD;  Location: [facility];  Service: General;;    Family History  Problem Relation Age of Onset  . Cancer Mother     lung  . Cancer Father     leukemia  . Hypertension Father     Social History:  reports that he has never smoked. He has never used smokeless tobacco. He reports that he drinks alcohol. He reports that he does not use illicit drugs.  Allergies:  Allergies  Allergen Reactions  . Shellfish Allergy     Throat stated to close    Medications:   Ancef Cardizem drip  Results for orders placed during the hospital encounter of 07/31/12 (from the past 48 hour(s))  POTASSIUM     Status: Normal   Collection Time   08/01/12  8:31 PM      Component Value Range Comment   Potassium 4.9  3.5 - 5.1 mEq/L   POTASSIUM     Status: Normal   Collection Time   08/02/12 12:30 AM      Component Value Range Comment   Potassium 4.9  3.5 - 5.1 mEq/L   CULTURE,  EXPECTORATED SPUTUM-ASSESSMENT     Status: Normal   Collection Time   08/02/12 12:34 AM      Component Value Range Comment   Specimen Description SPUTUM      Special Requests NONE      Sputum evaluation        Value: THIS SPECIMEN IS ACCEPTABLE. RESPIRATORY CULTURE REPORT TO FOLLOW.   Report Status 08/02/2012 FINAL     CULTURE, RESPIRATORY     Status: Normal (Preliminary result)   Collection Time   08/02/12 12:34 AM      Component Value Range Comment   Specimen Description SPUTUM      Special Requests NONE      Gram Stain        Value: MODERATE WBC PRESENT,BOTH PMN AND MONONUCLEAR     NO SQUAMOUS EPITHELIAL CELLS SEEN     RARE YEAST   Culture MODERATE CANDIDA ALBICANS      Report Status PENDING     CBC     Status: Abnormal   Collection Time   08/02/12   3:40 AM      Component Value Range Comment   WBC 19.9 (*) 4.0 - 10.5 K/uL    RBC 3.72 (*) 4.22 - 5.81 MIL/uL    Hemoglobin 11.0 (*) 13.0 - 17.0 g/dL DELTA CHECK NOTED   HCT 34.2 (*) 39.0 - 52.0 %    MCV 91.9  78.0 - 100.0 fL    MCH 29.6  26.0 - 34.0 pg    MCHC 32.2  30.0 - 36.0 g/dL    RDW 14.7  82.9 - 56.2 %    Platelets 228  150 - 400 K/uL   BASIC METABOLIC PANEL     Status: Abnormal   Collection Time   08/02/12  3:40 AM      Component Value Range Comment   Sodium 142  135 - 145 mEq/L    Potassium 4.6  3.5 - 5.1 mEq/L    Chloride 108  96 - 112 mEq/L    CO2 26  19 - 32 mEq/L    Glucose, Bld 129 (*) 70 - 99 mg/dL    BUN 27 (*) 6 - 23 mg/dL    Creatinine, Ser 1.30  0.50 - 1.35 mg/dL    Calcium 8.2 (*) 8.4 - 10.5 mg/dL    GFR calc non Af Amer 56 (*) >90 mL/min    GFR calc Af Amer 65 (*) >90 mL/min   CBC     Status: Abnormal   Collection Time   08/03/12  5:45 AM      Component Value Range Comment   WBC 11.7 (*) 4.0 - 10.5 K/uL    RBC 3.51 (*) 4.22 - 5.81 MIL/uL    Hemoglobin 10.3 (*) 13.0 - 17.0 g/dL    HCT 86.5 (*) 78.4 - 52.0 %    MCV 93.4  78.0 - 100.0 fL    MCH 29.3  26.0 - 34.0 pg    MCHC 31.4  30.0 - 36.0 g/dL    RDW 69.6  29.5 - 28.4 %    Platelets 198  150 - 400 K/uL   BASIC METABOLIC PANEL     Status: Abnormal   Collection Time   08/03/12  5:45 AM      Component Value Range Comment   Sodium 144  135 - 145 mEq/L    Potassium 4.2  3.5 - 5.1 mEq/L    Chloride 107  96 - 112 mEq/L    CO2  30  19 - 32 mEq/L    Glucose, Bld 107 (*) 70 - 99 mg/dL    BUN 20  6 - 23 mg/dL    Creatinine, Ser 1.61  0.50 - 1.35 mg/dL    Calcium 8.3 (*) 8.4 - 10.5 mg/dL    GFR calc non Af Amer 78 (*) >90 mL/min    GFR calc Af Amer 90 (*) >90 mL/min     No results found.  Review of Systems  Constitutional: Negative for fever, chills, weight loss, malaise/fatigue and diaphoresis.  HENT: Negative for hearing loss, ear pain, nosebleeds, congestion, sore throat, neck pain, tinnitus and ear  discharge.   Eyes: Negative for blurred vision, double vision, photophobia, pain, discharge and redness.  Respiratory: Negative for cough, hemoptysis, sputum production, shortness of breath, wheezing and stridor.   Cardiovascular: Positive for palpitations. Negative for chest pain, orthopnea, claudication, leg swelling and PND.  Gastrointestinal: Positive for abdominal pain. Negative for heartburn, nausea, vomiting, diarrhea, constipation and blood in stool.  Genitourinary: Negative for dysuria, urgency, frequency and hematuria.  Musculoskeletal: Positive for myalgias. Negative for back pain and joint pain.  Skin: Negative for itching and rash.  Neurological: Positive for dizziness. Negative for tingling, tremors, sensory change, speech change, focal weakness, seizures, weakness and headaches.  Psychiatric/Behavioral: Negative for suicidal ideas and hallucinations.   Blood pressure 124/57, pulse 103, temperature 99 F (37.2 C), temperature source Oral, resp. rate 11, height 5\' 10"  (1.778 m), weight 102.059 kg (225 lb), SpO2 95.00%. Physical Exam  Constitutional: He is oriented to person, place, and time. He appears well-developed and well-nourished. No distress.  HENT:  Head: Normocephalic and atraumatic.  Eyes: EOM are normal.  Neck: Neck supple. No JVD present.  Cardiovascular: Regular rhythm and normal heart sounds.  Exam reveals no gallop and no friction rub.   No murmur heard.      Tachycardic with regular rhythm  Respiratory: Effort normal and breath sounds normal. No stridor. No respiratory distress. He has no wheezes. He has no rales. He exhibits no tenderness.  GI: Soft. Bowel sounds are normal. He exhibits no distension. There is no tenderness. There is no rebound and no guarding.  Musculoskeletal: He exhibits no edema and no tenderness.  Neurological: He is alert and oriented to person, place, and time.  Skin: No rash noted. He is not diaphoretic. No erythema.  Psychiatric: He  has a normal mood and affect.    Assessment/Plan:  1. Supraventricular tachycardia:  The etiology is likely related to stress of recent surgery.  He has had multiple episodes of peri-operative SVTs in the past. He has no history of prior thyroid abnormalities, CAD or CHF.  His LVEF was 57% by nuclear stress test from 09/06/2008.  I will recommend continuation of Cardizem drip since he is currently NPO and to keep him on telemetry. Once he is initiated on p.o diet, he should be started on Cardizem 120 mg p.o q day to be titrated up to heart rate less than 100 bpm while weaning Cardizem drip to off. I will also recommend obtaining TSH/free T4, adequate pain control and a transthoracic echocardiogram to evaluate his left ventricular function.  Tajia Szeliga E 08/03/2012, 7:45 PM

## 2012-08-03 NOTE — Progress Notes (Signed)
EKG performed.  Results reported to MD.  Will continue to monitor.

## 2012-08-03 NOTE — Progress Notes (Signed)
Patient ID: [name], male   DOB: Jul 22, 1950, 62 y.o.   MRN: [id number] 3 Days Post-Op  Subjective: Some occasional nausea, none now.  Pain better.  Productive cough  Objective: Vital signs in last 24 hours: Temp:  [97.7 F (36.5 C)-99.4 F (37.4 C)] 99.4 F (37.4 C) (01/18 0616) Pulse Rate:  [102-107] 107  (01/18 0616) Resp:  [9-12] 12  (01/18 0824) BP: (135-148)/(65-79) 135/70 mmHg (01/18 0616) SpO2:  [91 %-95 %] 95 % (01/18 0824) Last BM Date: 07/31/12  Intake/Output from previous day: 01/17 0701 - 01/18 0700 In: 2900 [P.O.:600; I.V.:2300] Out: 1115 [Urine:1050; Drains:65] Intake/Output this shift: Total I/O In: -  Out: 20 [Drains:20]  General appearance: alert, cooperative and no distress Resp: clear to auscultation bilaterally GI: normal findings: soft, non-tender Incision/Wound: Dressing clean and dry.  Serosanguinous JP drainage  Lab Results:   Basename 08/03/12 0545 08/02/12 0340  WBC 11.7* 19.9*  HGB 10.3* 11.0*  HCT 32.8* 34.2*  PLT 198 228   BMET  Basename 08/03/12 0545 08/02/12 0340  NA 144 142  K 4.2 4.6  CL 107 108  CO2 30 26  GLUCOSE 107* 129*  BUN 20 27*  CREATININE 1.03 1.35  CALCIUM 8.3* 8.2*     Studies/Results: No results found.  Anti-infectives: Anti-infectives     Start     Dose/Rate Route Frequency Ordered Stop   07/31/12 0955   ceFAZolin (ANCEF) IVPB 2 g/50 mL premix        2 g 100 mL/hr over 30 Minutes Intravenous On call to O.R. 07/31/12 0955 07/31/12 1355          Assessment/Plan: s/p Procedure(s): HERNIA REPAIR VENTRAL ADULT INSERTION OF MESH Overall improved.. Hyperkalemia resolved. Cont pulm toilet OOB Start CLs slowly   LOS: 3 days    Isaack Preble T 08/03/2012

## 2012-08-03 NOTE — Progress Notes (Signed)
Pt HR 187-190.  Sustaining.  Asymptomatic.  MD notified.  Will continue to monitor.

## 2012-08-04 DIAGNOSIS — I498 Other specified cardiac arrhythmias: Secondary | ICD-10-CM

## 2012-08-04 DIAGNOSIS — I471 Supraventricular tachycardia: Secondary | ICD-10-CM

## 2012-08-04 LAB — BASIC METABOLIC PANEL
CO2: 28 mEq/L (ref 19–32)
Calcium: 7.7 mg/dL — ABNORMAL LOW (ref 8.4–10.5)
GFR calc Af Amer: >90 mL/min (ref >90)
GFR calc non Af Amer: >90 mL/min (ref >90)
Sodium: 138 mEq/L (ref 135–145)

## 2012-08-04 LAB — CBC
MCH: 29.1 pg (ref 26.0–34.0)
MCHC: 31.3 g/dL (ref 30.0–36.0)
Platelets: 223 10*3/uL (ref 150–400)
RBC: 3.3 MIL/uL — ABNORMAL LOW (ref 4.22–5.81)

## 2012-08-04 LAB — CULTURE, RESPIRATORY W GRAM STAIN

## 2012-08-04 MED ORDER — HYDROMORPHONE HCL PF 1 MG/ML IJ SOLN
0.5000 mg | INTRAMUSCULAR | Status: DC | PRN
  Administered 2012-08-04 – 2012-08-05 (×8): 1 mg via INTRAVENOUS
  Filled 2012-08-04 (×8): qty 1

## 2012-08-04 MED ORDER — PANTOPRAZOLE SODIUM 40 MG PO TBEC
40.0000 mg | DELAYED_RELEASE_TABLET | Freq: Every day | ORAL | Status: DC
  Administered 2012-08-04 – 2012-08-07 (×4): 40 mg via ORAL
  Filled 2012-08-04 (×4): qty 1

## 2012-08-04 MED ORDER — DILTIAZEM HCL 30 MG PO TABS
30.0000 mg | ORAL_TABLET | Freq: Four times a day (QID) | ORAL | Status: DC
  Administered 2012-08-04 – 2012-08-05 (×5): 30 mg via ORAL
  Filled 2012-08-04 (×9): qty 1

## 2012-08-04 NOTE — Progress Notes (Signed)
Noted increased JP output, about 200cc bloody output on my shift, notified Dr. Johna Sheriff of the increase JP output and he assessed the output color, no new order given. Patient PCA D/C'd, on PO and IV pain medication, encouraged patient to use more PO pain medication than IV, was ablt to encourage patient to ambulate and he did well ambulated in the hall with a  walker one person assist and tolerated it well, Patient heart rate have been in the 70s since IV Cardizem was changed to PO. Started on full liq diet tolerating it okay but still C/O hiccups mostly but only C/O nausea once  PRN zofran given, will continue to assess patient.

## 2012-08-04 NOTE — Progress Notes (Signed)
Patient ID: [name], male   DOB: November 06, 1950, 62 y.o.   MRN: [id number] 4 Days Post-Op  Subjective: No C/O this AM.  Tol CL without nausea but no flatus or BM yet.  Objective: Vital signs in last 24 hours: Temp:  [98.3 F (36.8 C)-99 F (37.2 C)] 98.3 F (36.8 C) (01/19 0522) Pulse Rate:  [81-190] 83  (01/19 0522) Resp:  [8-20] 14  (01/19 0817) BP: (104-129)/(52-80) 125/61 mmHg (01/19 0522) SpO2:  [95 %-97 %] 97 % (01/19 0817) FiO2 (%):  [33 %] 33 % (01/19 0516) Last BM Date: 07/31/12  Intake/Output from previous day: 01/18 0701 - 01/19 0700 In: 1245.2 [P.O.:118; I.V.:1127.2] Out: 720 [Urine:550; Drains:170] Intake/Output this shift: Total I/O In: 480 [P.O.:480] Out: 475 [Urine:375; Drains:100]  General appearance: alert, cooperative and no distress Resp: clear to auscultation bilaterally Cardio: regular rate and rhythm GI: Mild distention, soft and non tender Incision/Wound: Midline wound clean and dry.  Tape blister on left side of abdomen Somewhat increased bloody JP drainage  Lab Results:   Basename 08/04/12 0451 08/03/12 0545  WBC 7.7 11.7*  HGB 9.6* 10.3*  HCT 30.7* 32.8*  PLT 223 198   BMET  Basename 08/04/12 0451 08/03/12 1854  NA 138 144  K 3.6 4.8  CL 105 108  CO2 28 26  GLUCOSE 117* 100*  BUN 17 20  CREATININE 0.86 0.91  CALCIUM 7.7* 8.2*     Studies/Results: No results found.  Anti-infectives: Anti-infectives     Start     Dose/Rate Route Frequency Ordered Stop   07/31/12 0955   ceFAZolin (ANCEF) IVPB 2 g/50 mL premix        2 g 100 mL/hr over 30 Minutes Intravenous On call to O.R. 07/31/12 0955 07/31/12 1355          Assessment/Plan: s/p Procedure(s): HERNIA REPAIR VENTRAL ADULT INSERTION OF MESH SVT-improved, cardiology following Still some ileus, limit to full liquids Increase activity D/C PCA, try oral pain meds first Hold heparin due to bloody JP drainage   LOS: 4 days    Mikhaila Roh T 08/04/2012  kj

## 2012-08-04 NOTE — Progress Notes (Signed)
Paged Dr. Johna Sheriff. Informed that patients pulse was sustaining in the 80's and still on Cardizem drip. Dr. Johna Sheriff said to keep him on the Cardizem drip until Dr. Donnie Aho rounds this am. Will monitor patient.

## 2012-08-04 NOTE — Progress Notes (Signed)
Pt's heart rate slowly came down to low 100's. Titrated down to 72m. Will monitor

## 2012-08-04 NOTE — Progress Notes (Signed)
   Subjective:  Denies CP or dyspnea   Objective:  Filed Vitals:   08/04/12 0000 08/04/12 0351 08/04/12 0516 08/04/12 0522  BP:    125/61  Pulse:   81 83  Temp:    98.3 F (36.8 C)  TempSrc:    Oral  Resp: 11 9 8 16   Height:      Weight:      SpO2: 96% 96% 96% 96%    Intake/Output from previous day:  Intake/Output Summary (Last 24 hours) at 08/04/12 0805 Last data filed at 08/04/12 0700  Gross per 24 hour  Intake 1245.17 ml  Output    720 ml  Net 525.17 ml    Physical Exam: Physical exam: Well-developed well-nourished in no acute distress.  Skin is warm and dry.  HEENT is normal.  Neck is supple.  Chest is clear to auscultation with normal expansion.  Cardiovascular exam is regular rate and rhythm.  Abdominal exam s/p abdominal surgery Extremities show no edema. neuro grossly intact    Lab Results: Basic Metabolic Panel:  Basename 08/04/12 0451 08/03/12 1854  NA 138 144  K 3.6 4.8  CL 105 108  CO2 28 26  GLUCOSE 117* 100*  BUN 17 20  CREATININE 0.86 0.91  CALCIUM 7.7* 8.2*  MG -- --  PHOS -- --   CBC:  Basename 08/04/12 0451 08/03/12 0545  WBC 7.7 11.7*  NEUTROABS -- --  HGB 9.6* 10.3*  HCT 30.7* 32.8*  MCV 93.0 93.4  PLT 223 198     Assessment/Plan:  1 SVT-patient remains in sinus rhythm this a.m. Change Cardizem to by mouth. Await echocardiogram. If he has more frequent episodes in the future can consider ablation. 2 status post abdominal surgery-management per general surgery.  Kirk Ruths 08/04/2012, 8:05 AM

## 2012-08-05 DIAGNOSIS — Z9889 Other specified postprocedural states: Secondary | ICD-10-CM

## 2012-08-05 DIAGNOSIS — K631 Perforation of intestine (nontraumatic): Secondary | ICD-10-CM

## 2012-08-05 DIAGNOSIS — I517 Cardiomegaly: Secondary | ICD-10-CM

## 2012-08-05 LAB — BASIC METABOLIC PANEL
CO2: 27 mEq/L (ref 19–32)
GFR calc non Af Amer: >90 mL/min (ref >90)
Glucose, Bld: 109 mg/dL — ABNORMAL HIGH (ref 70–99)
Potassium: 3.5 mEq/L (ref 3.5–5.1)
Sodium: 139 mEq/L (ref 135–145)

## 2012-08-05 LAB — CBC
Hemoglobin: 10.1 g/dL — ABNORMAL LOW (ref 13.0–17.0)
RBC: 3.41 MIL/uL — ABNORMAL LOW (ref 4.22–5.81)
WBC: 7.8 10*3/uL (ref 4.0–10.5)

## 2012-08-05 MED ORDER — DILTIAZEM HCL ER COATED BEADS 120 MG PO CP24
120.0000 mg | ORAL_CAPSULE | Freq: Every day | ORAL | Status: DC
  Administered 2012-08-05 – 2012-08-07 (×3): 120 mg via ORAL
  Filled 2012-08-05 (×3): qty 1

## 2012-08-05 MED ORDER — CHLORPROMAZINE HCL 25 MG/ML IJ SOLN
50.0000 mg | Freq: Once | INTRAMUSCULAR | Status: AC
  Administered 2012-08-05: 50 mg via INTRAMUSCULAR
  Filled 2012-08-05: qty 2

## 2012-08-05 NOTE — Progress Notes (Signed)
   Subjective:  Denies CP or dyspnea, complains of hiccups.   Objective:  Filed Vitals:   08/04/12 0817 08/04/12 1618 08/04/12 2030 08/05/12 0610  BP:  128/57 121/56 152/68  Pulse:  74 76 77  Temp:  98 F (36.7 C) 98.3 F (36.8 C) 98.1 F (36.7 C)  TempSrc:  Oral Oral Oral  Resp: 14 16 18 18   Height:      Weight:      SpO2: 97% 98% 97% 100%    Intake/Output from previous day:  Intake/Output Summary (Last 24 hours) at 08/05/12 0714 Last data filed at 08/05/12 1245  Gross per 24 hour  Intake   1680 ml  Output   1785 ml  Net   -105 ml   Telemetry: NSR with occ PVCs, 6 beat run NSVT. No SVT.  Physical Exam: Physical exam: Well-developed well-nourished in no acute distress.  Skin is warm and dry.  HEENT is normal.  Neck is supple.  Chest is clear to auscultation with normal expansion.  Cardiovascular exam is regular rate and rhythm.  Abdominal exam s/p abdominal surgery, positive BS Extremities show no edema. neuro grossly intact    Lab Results: Basic Metabolic Panel:  Basename 08/05/12 0421 08/04/12 0451  NA 139 138  K 3.5 3.6  CL 103 105  CO2 27 28  GLUCOSE 109* 117*  BUN 14 17  CREATININE 0.81 0.86  CALCIUM 8.2* 7.7*  MG -- --  PHOS -- --   CBC:  Basename 08/05/12 0421 08/04/12 0451  WBC 7.8 7.7  NEUTROABS -- --  HGB 10.1* 9.6*  HCT 31.1* 30.7*  MCV 91.2 93.0  PLT 218 223     Assessment/Plan:  1 SVT-patient remains in sinus rhythm this a.m. Change to diltiazem SR 120 mg daily. Await echocardiogram today. If he has more frequent episodes in the future can consider ablation. 2 status post abdominal surgery-management per general surgery.  Collier Salina [facility] 08/05/2012, 7:14 AM

## 2012-08-05 NOTE — Progress Notes (Signed)
  Echocardiogram 2D Echocardiogram has been performed.  Christle Nolting 08/05/2012, 11:58 AM 

## 2012-08-05 NOTE — Progress Notes (Signed)
Pt had 6 beats of V. Tach on heart monitor. Pt asymptomatic, VSS.  MD on call for Cardiology notified.  No new orders at this time. Will continue to monitor. [place]

## 2012-08-05 NOTE — Progress Notes (Signed)
Patient ID: [name], male   DOB: 04-11-1951, 62 y.o.   MRN: [id number] 5 Days Post-Op  Subjective: No major complaints today. Gradually feeling better. He does have persistent hiccups. He is passing gas, no bowel movement. Tolerating full liquids.  Objective: Vital signs in last 24 hours: Temp:  [98 F (36.7 C)-98.3 F (36.8 C)] 98.1 F (36.7 C) (01/20 0610) Pulse Rate:  [74-77] 77  (01/20 0610) Resp:  [16-18] 18  (01/20 0610) BP: (121-152)/(56-68) 152/68 mmHg (01/20 0610) SpO2:  [97 %-100 %] 100 % (01/20 0610) Last BM Date: 07/30/12  Intake/Output from previous day: 01/19 0701 - 01/20 0700 In: 1680 [P.O.:1680] Out: 1785 [Urine:1450; Drains:335] Intake/Output this shift:    General appearance: alert, cooperative and no distress Resp: clear to auscultation bilaterally GI: normal findings: soft, non-tender Incision/Wound: blister left abdomen covered with clean dressing. Midline wound clean and dry without evidence of infection. JP drainage remains somewhat bloody.  Lab Results:   Basename 08/05/12 0421 08/04/12 0451  WBC 7.8 7.7  HGB 10.1* 9.6*  HCT 31.1* 30.7*  PLT 218 223   BMET  Basename 08/05/12 0421 08/04/12 0451  NA 139 138  K 3.5 3.6  CL 103 105  CO2 27 28  GLUCOSE 109* 117*  BUN 14 17  CREATININE 0.81 0.86  CALCIUM 8.2* 7.7*     Studies/Results: No results found.  Anti-infectives: Anti-infectives     Start     Dose/Rate Route Frequency Ordered Stop   07/31/12 0955   ceFAZolin (ANCEF) IVPB 2 g/50 mL premix        2 g 100 mL/hr over 30 Minutes Intravenous On call to O.R. 07/31/12 0955 07/31/12 1355          Assessment/Plan: s/p Procedure(s): HERNIA REPAIR VENTRAL ADULT INSERTION OF MESH Overall making progress. Started to pass gas. Will leave on full liquid diet for now. Discontinue Foley catheter. Increase activity. Hyperkalemia, SVT both resolved Will try single dose of Thorazine for his hiccups   LOS: 5 days     Francely Craw T 08/05/2012

## 2012-08-05 NOTE — Progress Notes (Signed)
Report received from Tara Dark, RN. No change from initial am assessment. Will continue to follow the plan of care. 

## 2012-08-06 LAB — BASIC METABOLIC PANEL
BUN: 11 mg/dL (ref 6–23)
Chloride: 103 mEq/L (ref 96–112)
GFR calc Af Amer: >90 mL/min (ref >90)
Glucose, Bld: 104 mg/dL — ABNORMAL HIGH (ref 70–99)
Potassium: 3.1 mEq/L — ABNORMAL LOW (ref 3.5–5.1)
Sodium: 139 mEq/L (ref 135–145)

## 2012-08-06 LAB — CBC
HCT: 30.9 % — ABNORMAL LOW (ref 39.0–52.0)
Hemoglobin: 10.1 g/dL — ABNORMAL LOW (ref 13.0–17.0)
MCHC: 32.7 g/dL (ref 30.0–36.0)
RBC: 3.46 MIL/uL — ABNORMAL LOW (ref 4.22–5.81)

## 2012-08-06 MED ORDER — POTASSIUM CHLORIDE 20 MEQ/15ML (10%) PO LIQD
20.0000 meq | Freq: Two times a day (BID) | ORAL | Status: DC
  Filled 2012-08-06 (×2): qty 15

## 2012-08-06 MED ORDER — POTASSIUM CHLORIDE CRYS ER 20 MEQ PO TBCR
20.0000 meq | EXTENDED_RELEASE_TABLET | Freq: Two times a day (BID) | ORAL | Status: DC
  Administered 2012-08-06 – 2012-08-07 (×3): 20 meq via ORAL
  Filled 2012-08-06 (×4): qty 1

## 2012-08-06 MED ORDER — PROMETHAZINE HCL 25 MG RE SUPP
25.0000 mg | Freq: Once | RECTAL | Status: AC
  Administered 2012-08-06: 25 mg via RECTAL
  Filled 2012-08-06: qty 1

## 2012-08-06 NOTE — Progress Notes (Addendum)
Subjective:  Denies CP or dyspnea, still has hiccups.   Objective:  Filed Vitals:   08/05/12 1451 08/05/12 2026 08/06/12 0500 08/06/12 0532  BP: 151/61 137/60  153/63  Pulse: 80 82 79 84  Temp: 98.8 F (37.1 C) 98.2 F (36.8 C)  97.8 F (36.6 C)  TempSrc: Oral Oral  Oral  Resp: 16 18  16   Height:      Weight:      SpO2: 98% 93%  95%    Intake/Output from previous day:  Intake/Output Summary (Last 24 hours) at 08/06/12 0723 Last data filed at 08/06/12 0640  Gross per 24 hour  Intake    494 ml  Output   1347 ml  Net   -853 ml   Telemetry: NSR with occ PVCs, No SVT.  Physical Exam: Physical exam: Well-developed well-nourished in no acute distress.  Skin is warm and dry.  HEENT is normal.  Neck is supple.  Chest is clear to auscultation with normal expansion.  Cardiovascular exam is regular rate and rhythm.  Abdominal exam s/p abdominal surgery, positive BS Extremities show no edema. neuro grossly intact    Lab Results: Basic Metabolic Panel:  Basename 08/06/12 0440 08/05/12 0421  NA 139 139  K 3.1* 3.5  CL 103 103  CO2 27 27  GLUCOSE 104* 109*  BUN 11 14  CREATININE 0.89 0.81  CALCIUM 8.1* 8.2*  MG -- --  PHOS -- --   CBC:  Basename 08/06/12 0440 08/05/12 0421  WBC 9.2 7.8  NEUTROABS -- --  HGB 10.1* 10.1*  HCT 30.9* 31.1*  MCV 89.3 91.2  PLT 240 218   Transthoracic Echocardiography  (Report amended )  Patient: Justin, Callahan MR #: [id number] Study Date: 08/05/2012 Gender: M Age: 62 Height: 177.8cm Weight: 102.1kg BSA: 2.102m2 Pt. Status: Room: 1437  ORDERING JMartinique PWest [place] CornerJ[place] Peter PERFORMING [place] ADMITTING [place], BMarland KitchenTappan ATTENDING [place], BMarland KitchenTappan SONOGRAPHER BWyatt Mage RDCS cc:  ------------------------------------------------------------ LV EF: 60% - 65%  ------------------------------------------------------------ Indications: Supraventricular tachycardia  427.0.  ------------------------------------------------------------ History: Risk factors: ADHD. Asthma. Acute bronchitis. GERD. Respiratory failure.  ------------------------------------------------------------ Study Conclusions  - Left ventricle: The cavity size was normal. Wall thickness was normal. Systolic function was normal. The estimated ejection fraction was in the range of 60% to 65%. Indeterminant diastolic function. Wall motion was normal; there were no regional wall motion abnormalities. - Aortic valve: There was no stenosis. - Aorta: Mildly dilated aortic root and ascending aorta. Aortic root dimension: 342m(ED). Ascending aortic diameter: 407mS). - Mitral valve: Trivial regurgitation. - Left atrium: The atrium was mildly dilated. - Right ventricle: The cavity size was mildly dilated. Systolic function was normal. - Right atrium: The atrium was mildly dilated. - Pulmonary arteries: No complete TR doppler jet so unable to estimate PA systolic pressure. - Systemic veins: IVC was not visualized. Impressions:  - Normal LV size and systolic function, EF 60-52-84%ildly dilated RV with normal systolic function. No significant valvular abnormalities. Mild biatrial enlargement. Transthoracic echocardiography. M-mode, complete 2D, spectral Doppler, and color Doppler. Height: Height: 177.8cm. Height: 70in. Weight: Weight: 102.1kg. Weight: 224.5lb. Body mass index: BMI: 32.3kg/m^2. Body surface area: BSA: 2.38m52mBlood pressure: 152/68. Patient status: Inpatient. Location: Bedside.  ------------------------------------------------------------  ------------------------------------------------------------ Left ventricle: The cavity size was normal. Wall thickness was normal. Systolic function was normal. The estimated ejection fraction was in the range of 60% to 65%. Indeterminant diastolic function. Wall motion was normal; there were no regional wall motion  abnormalities.  ------------------------------------------------------------ Aortic valve: Trileaflet; mildly calcified leaflets. Doppler: There was no stenosis. No regurgitation.  ------------------------------------------------------------ Aorta: Mildly dilated aortic root and ascending aorta.  ------------------------------------------------------------ Mitral valve: Doppler: There was no evidence for stenosis. Trivial regurgitation. Peak gradient: 14m Hg (D).  ------------------------------------------------------------ Left atrium: The atrium was mildly dilated.  ------------------------------------------------------------ Right ventricle: The cavity size was mildly dilated. Systolic function was normal.  ------------------------------------------------------------ Pulmonic valve: Structurally normal valve. Cusp separation was normal. Doppler: Transvalvular velocity was within the normal range. No regurgitation.  ------------------------------------------------------------ Tricuspid valve: Doppler: No significant regurgitation.  ------------------------------------------------------------ Pulmonary artery: No complete TR doppler jet so unable to estimate PA systolic pressure.  ------------------------------------------------------------ Right atrium: The atrium was mildly dilated.  ------------------------------------------------------------ Pericardium: There was no pericardial effusion.  ------------------------------------------------------------ Systemic veins: IVC was not visualized.  ------------------------------------------------------------ Post procedure conclusions Ascending Aorta:  - Mildly dilated aortic root and ascending aorta.  ------------------------------------------------------------  2D measurements Normal Doppler measurements Normal Left ventricle Left ventricle LVID ED, 44 mm 43-52 Ea, lat ann, 15. cm/s ------ chord, tiss DP 5 PLAX  E/Ea, lat 6.3 ------ LVID ES, 27 mm 23-38 ann, tiss DP 7 chord, Ea, med ann, 11. cm/s ------ PLAX tiss DP 1 FS, chord, 39 % >29 E/Ea, med 8.8 ------ PLAX ann, tiss DP 9 LVPW, ED 12 mm ------ Mitral valve IVS/LVPW 1 <1.3 Peak E vel 98. cm/s ------ ratio, ED 7 Ventricular septum Peak A vel 99. cm/s ------ IVS, ED 12 mm ------ 2 Aorta Deceleration 204 ms 150-23 Root diam, 37 mm ------ time 0 ED Peak 4 mm ------ AAo AP 40 mm ------ gradient, D Hg diam, S Peak E/A 1 ------ Left atrium ratio AP dim 41 mm ------ Right ventricle AP dim 1.87 cm/m^2 <2.2 Sa vel, lat 22. cm/s ------ index ann, tiss DP 6 Vol, S 67 ml ------ Vol index, 30.6 ml/m^2 ------ S  ------------------------------------------------------------ AMarcell Anger Dalton 2014-01-20T12:52:[id number]   Assessment/Plan:  1 SVT-patient remains in sinus rhythm this a.m. On diltiazem SR 120 mg daily. Echo unremarkable. If he has more frequent episodes in the future can consider ablation. Anticipate DC soon. We will arrange follow up in our office. I will sign off for now. Please call with questions. 2 status post abdominal surgery-management per general surgery.  PCollier SalinaJCincinnati Va Medical Center - Fort Thomas1/21/2014, 7:23 AM

## 2012-08-06 NOTE — Progress Notes (Signed)
Patient ID: [name], male   DOB: 04-05-1951, 62 y.o.   MRN: [id number] 6 Days Post-Op  Subjective: No complaints this morning. He has had several bowel movements. Tolerating full liquids well. No abdominal pain or chest pain or shortness of breath. Still has some pickups.  Objective: Vital signs in last 24 hours: Temp:  [97.8 F (36.6 C)-98.8 F (37.1 C)] 97.8 F (36.6 C) (01/21 0532) Pulse Rate:  [79-84] 84  (01/21 0532) Resp:  [16-18] 16  (01/21 0532) BP: (137-153)/(60-63) 153/63 mmHg (01/21 0532) SpO2:  [93 %-98 %] 95 % (01/21 0532) Last BM Date: 08/05/12  Intake/Output from previous day: 01/20 0701 - 01/21 0700 In: 494 [P.O.:480; IV Piggyback:14] Out: 1347 [Urine:1200; Drains:147] Intake/Output this shift:    General appearance: alert, cooperative and no distress GI: normal findings: soft, non-tender Incision/Wound: incision clean and dry without infection.  J-P drain it still somewhat bloody but decreased.  Lab Results:   Basename 08/06/12 0440 08/05/12 0421  WBC 9.2 7.8  HGB 10.1* 10.1*  HCT 30.9* 31.1*  PLT 240 218   BMET  Basename 08/06/12 0440 08/05/12 0421  NA 139 139  K 3.1* 3.5  CL 103 103  CO2 27 27  GLUCOSE 104* 109*  BUN 11 14  CREATININE 0.89 0.81  CALCIUM 8.1* 8.2*     Studies/Results: No results found.  Anti-infectives: Anti-infectives     Start     Dose/Rate Route Frequency Ordered Stop   07/31/12 0955   ceFAZolin (ANCEF) IVPB 2 g/50 mL premix        2 g 100 mL/hr over 30 Minutes Intravenous On call to O.R. 07/31/12 0955 07/31/12 1355          Assessment/Plan: s/p Procedure(s): HERNIA REPAIR VENTRAL ADULT INSERTION OF MESH Doing well with resolving ileus. Will advance to regular diet. Continue to increase activity. Mild hypokalemia, replaced with oral supplements today. Probable discharge tomorrow.   LOS: 6 days    Terrilynn Postell T 08/06/2012

## 2012-08-06 NOTE — Progress Notes (Signed)
Pt having multiple loose/diarrhea bowel movements since the start of last night's (1/20) shift.  Increasing in the early morning hours. Will continue to monitor pt throughout the rest of the night.

## 2012-08-06 NOTE — Progress Notes (Signed)
Discussed in long length of stay meeting. 

## 2012-08-07 LAB — BASIC METABOLIC PANEL
CO2: 26 mEq/L (ref 19–32)
Calcium: 8.2 mg/dL — ABNORMAL LOW (ref 8.4–10.5)
GFR calc non Af Amer: >90 mL/min (ref >90)
Glucose, Bld: 119 mg/dL — ABNORMAL HIGH (ref 70–99)
Potassium: 3 mEq/L — ABNORMAL LOW (ref 3.5–5.1)
Sodium: 138 mEq/L (ref 135–145)

## 2012-08-07 MED ORDER — DILTIAZEM HCL ER COATED BEADS 120 MG PO CP24: 120.0000 mg | ORAL_CAPSULE | Freq: Every day | ORAL | Status: DC

## 2012-08-07 MED ORDER — OXYCODONE-ACETAMINOPHEN 5-325 MG PO TABS: 1.0000 | ORAL_TABLET | ORAL | Status: DC | PRN

## 2012-08-07 NOTE — Discharge Summary (Signed)
Patient ID: [name] [id number] 62 y.o. 20-Sep-1950  07/31/2012  Discharge date and time: 08/07/2012   Admitting Physician: Excell Seltzer T  Discharge Physician: Excell Seltzer T  Admission Diagnoses: recurrent ventral incisional hernia  Discharge Diagnoses: Same  Operations: Procedure(s): HERNIA REPAIR VENTRAL ADULT INSERTION OF MESH  Admission Condition: fair  Discharged Condition: good  Indication for Admission: patient is a 62 year old male with multiple previous abdominal procedures. Most recently last year he had perforation of small bowel and incarcerated midline ventral hernia. He underwent small bowel resection and due to severe peritonitis had biologic material placed as a bridge. He has had an expected recurrent hernia in his midline which is significantly symptomatic and he is electively admitted for repair.  Hospital Course: patient was admitted on the morning of this procedure and underwent an extensive open repair of his recurrent ventral hernia with component release and placement of a large piece of mesh intra-abdominally. The procedure went smoothly. On the first postoperative day however he was noted to have elevated potassium of 7.2 and elevated creatinine of over 2. He had some tachycardia and expected pain. Urine output was good. The etiology for this was not clear. He was treated with glucose and insulin and bicarbonate and placed on a monitored bed. With this treatment his potassium returned to normal. Urine output remained good and creatinine was normal the following day. He had expected moderate pain. He had some pulmonary congestion and productive cough treated with pulmonary toilet. On the third postoperative day he developed supraventricular tachycardia with a heart rate of 180. He had a history of this postoperatively previously. He was placed on a Cardizem drip with good control and was seen by cardiology. He was switched over to oral diltiazem  which we continue to discharge and cardiology followup as planned. He continued to improve and began having flatus and bowel movements and diet was advanced. At the time of discharge he is afebrile with normal vital signs. JP drain remains in place with some moderately bloody drainage 70 cc the last 24 hours. Midline incision is healing nicely without evidence of infection. He does have some blisters on his left mid abdomen from tape pressure being treated with absorbent dressings.  Consults: cardiology   Disposition: Home  Patient Instructions:   Cedar, Ditullio  Home Medication Instructions YNW:[id number]   Printed on:08/07/12 6578  Medication Information                    Vitamin D, Ergocalciferol, (DRISDOL) 50000 UNITS CAPS Take 1 capsule (50,000 Units total) by mouth 2 (two) times a week.           temazepam (RESTORIL) 30 MG capsule Take 1 capsule (30 mg total) by mouth at bedtime as needed for sleep.           ibuprofen (ADVIL,MOTRIN) 200 MG tablet Take 200 mg by mouth every 6 (six) hours as needed. Pain           Multiple Vitamin (MULTIVITAMIN WITH MINERALS) TABS Take 1 tablet by mouth daily.           diltiazem (CARDIZEM CD) 120 MG 24 hr capsule Take 1 capsule (120 mg total) by mouth daily.           oxyCODONE-acetaminophen (PERCOCET/ROXICET) 5-325 MG per tablet Take 1-2 tablets by mouth every 4 (four) hours as needed.             Activity: no heavy lifting for 6 weeks Diet: regular  diet Wound Care: none needed  Follow-up:  With Dr. Excell Seltzer in 1 week.  Signed: [name] MD, FACS  08/07/2012, 8:19 AM

## 2012-08-09 ENCOUNTER — Ambulatory Visit (INDEPENDENT_AMBULATORY_CARE_PROVIDER_SITE_OTHER): Payer: 59 | Admitting: General Surgery

## 2012-08-09 ENCOUNTER — Encounter (INDEPENDENT_AMBULATORY_CARE_PROVIDER_SITE_OTHER): Payer: 59 | Admitting: General Surgery

## 2012-08-09 ENCOUNTER — Encounter (INDEPENDENT_AMBULATORY_CARE_PROVIDER_SITE_OTHER): Payer: Self-pay | Admitting: General Surgery

## 2012-08-09 VITALS — BP 124/81 | HR 84 | Temp 98.6°F | Resp 14 | Ht 70.0 in | Wt 220.2 lb

## 2012-08-09 DIAGNOSIS — Z09 Encounter for follow-up examination after completed treatment for conditions other than malignant neoplasm: Secondary | ICD-10-CM

## 2012-08-09 NOTE — Progress Notes (Signed)
History: Patient returns for his first office visit having been discharged early this week following extensive ventral hernia repair with component release the week previously. His postoperative course was a little rocky initially a bump in his renal function and potassium up to 7.2 that corrected quickly and then subsequently he had SVT that corrected with medications. He then had increase in bloody JP drainage requiring stopping his heparin. Reports as being at home he is doing pretty well with no specific concerns.  Exam: BP 124/81  Pulse 84  Temp 98.6 F (37 C) (Temporal)  Resp 14  Ht 5' 10"  (1.778 m)  Wt 220 lb 3.2 oz (99.882 kg)  BMI 31.60 kg/m2 General: Looks well in no distress Abdomen: His midline wound is well-healed without evidence of infection or other problems I removed his staples and Steri-Stripped the wound. He has some superficial blisters on either side of the incision where the tape pulled on the skin postoperatively and these are healing nicely. His JP drainage is still about 50 cc every 8 hours but it is thin and less bloody.  Assessment and plan: At this point recovered well following extensive ventral hernia repair. I think he can leave the superficial blisters uncovered after tomorrow I told him to dry the skin after a shower leave this open. We will leave the JP drain for now and I will see him in one week.

## 2012-08-12 ENCOUNTER — Telehealth (INDEPENDENT_AMBULATORY_CARE_PROVIDER_SITE_OTHER): Payer: Self-pay | Admitting: General Surgery

## 2012-08-12 MED ORDER — HYDROCODONE-ACETAMINOPHEN 5-325 MG PO TABS: 1.0000 | ORAL_TABLET | Freq: Four times a day (QID) | ORAL | Status: DC | PRN

## 2012-08-12 NOTE — Telephone Encounter (Signed)
Patient called requesting a refill of vicodin status post hernia repair 07/31/2012. Ok to refill per Dr Johna Sheriff. Norco called to [facility]. Patient aware.

## 2012-08-14 ENCOUNTER — Telehealth (INDEPENDENT_AMBULATORY_CARE_PROVIDER_SITE_OTHER): Payer: Self-pay | Admitting: General Surgery

## 2012-08-14 NOTE — Telephone Encounter (Signed)
Pt called to ask about his JP drain:  There is more drainage for the last two days than had been accumulating until now.  He measured over 120 ml yesterday and is 140 ml so far today.  Reassured pt that drainage will vary and as long as there is not change in the color or consistency, just continue to drain and measure.  Call back if it becomes frankly bloody or begins to drain around the tubing.  He understands.

## 2012-08-16 ENCOUNTER — Encounter (INDEPENDENT_AMBULATORY_CARE_PROVIDER_SITE_OTHER): Payer: Self-pay | Admitting: General Surgery

## 2012-08-16 ENCOUNTER — Ambulatory Visit (INDEPENDENT_AMBULATORY_CARE_PROVIDER_SITE_OTHER): Payer: 59 | Admitting: General Surgery

## 2012-08-16 VITALS — BP 138/76 | HR 80 | Temp 98.0°F | Resp 18 | Ht 70.0 in | Wt 210.0 lb

## 2012-08-16 DIAGNOSIS — Z09 Encounter for follow-up examination after completed treatment for conditions other than malignant neoplasm: Secondary | ICD-10-CM

## 2012-08-16 NOTE — Progress Notes (Signed)
History: Patient returns for further followup status post extensive open ventral incisional hernia repair. He has soreness but no severe pain or other complaints. His drain however still putting out about 100 cc per day.  Exam: BP 138/76  Pulse 80  Temp 98 F (36.7 C)  Resp 18  Ht 5' 10"  (1.778 m)  Wt 210 lb (95.255 kg)  BMI 30.13 kg/m2 General: Appears well under the about the exam room easily Abdomen: Midline incision is for a nicely healed. Repair feels solid. JP drainage is old bloody without evidence of infection  Assessment and plan: Doing well following hernia repair but still requires his JP. Return in 2 weeks or sooner if the drainage is less than 30 cc per day.

## 2012-08-21 ENCOUNTER — Telehealth (INDEPENDENT_AMBULATORY_CARE_PROVIDER_SITE_OTHER): Payer: Self-pay | Admitting: General Surgery

## 2012-08-21 NOTE — Telephone Encounter (Signed)
Pt of Dr. Johna Sheriff called to request additional pain meds.  Norco refilled on 08/12/12, per standing postop orders.  Dr. Johna Sheriff is not available to ask, so will submit request to MD covering Urgent office;  Dr. Donell Beers approved the refill.  Pt had "extensive open repair of ventral hernia."  Still has JP drain.  Advised pt to use NSAIDs to augment his narcotics as well to wean off the narcotics.  He understands.  Hydrocodone 5/325 mg, # 30, 1-2 po Q4-6H prn pain, no refill called to [facility]:  [phone]

## 2012-08-26 ENCOUNTER — Other Ambulatory Visit (INDEPENDENT_AMBULATORY_CARE_PROVIDER_SITE_OTHER): Payer: Self-pay | Admitting: General Surgery

## 2012-08-26 ENCOUNTER — Telehealth (INDEPENDENT_AMBULATORY_CARE_PROVIDER_SITE_OTHER): Payer: Self-pay

## 2012-08-26 NOTE — Telephone Encounter (Signed)
The pt called to report that his drain is about on the 3rd day with 30 cc or less.  He has no redness around it.  It is tender.  He has no fever.  He has an appointment on Friday but let him know if he needs to come in sooner.

## 2012-08-27 ENCOUNTER — Other Ambulatory Visit (INDEPENDENT_AMBULATORY_CARE_PROVIDER_SITE_OTHER): Payer: Self-pay

## 2012-08-27 DIAGNOSIS — G8918 Other acute postprocedural pain: Secondary | ICD-10-CM

## 2012-08-27 MED ORDER — HYDROCODONE-ACETAMINOPHEN 5-325 MG PO TABS: 1.0000 | ORAL_TABLET | Freq: Four times a day (QID) | ORAL | Status: DC | PRN

## 2012-08-28 NOTE — Progress Notes (Signed)
Refill authorized by Dr. Derrell Lolling (Dr. Johna Sheriff on vac)

## 2012-08-30 ENCOUNTER — Ambulatory Visit (INDEPENDENT_AMBULATORY_CARE_PROVIDER_SITE_OTHER): Payer: 59 | Admitting: General Surgery

## 2012-08-30 ENCOUNTER — Encounter (INDEPENDENT_AMBULATORY_CARE_PROVIDER_SITE_OTHER): Payer: Self-pay | Admitting: General Surgery

## 2012-08-30 ENCOUNTER — Encounter (INDEPENDENT_AMBULATORY_CARE_PROVIDER_SITE_OTHER): Payer: 59 | Admitting: General Surgery

## 2012-08-30 VITALS — BP 132/84 | HR 78 | Temp 97.7°F | Resp 18 | Ht 70.0 in | Wt 209.4 lb

## 2012-08-30 DIAGNOSIS — Z09 Encounter for follow-up examination after completed treatment for conditions other than malignant neoplasm: Secondary | ICD-10-CM

## 2012-08-30 NOTE — Progress Notes (Signed)
History: Patient returns now approaching 4 weeks following open repair of extensive recurrent ventral incisional hernia. His JP drain is in place and is now draining less than 20 cc a day. He is feeling gradually better but still very sore with activity.  Exam: BP 132/84  Pulse 78  Temp(Src) 97.7 F (36.5 C) (Temporal)  Resp 18  Ht 5' 10"  (1.778 m)  Wt 209 lb 6.4 oz (94.983 kg)  BMI 30.05 kg/m2 General: Appears well Abdomen: His midline wound is well healed. His abdominal wall feels solid on exam with him standing and straining slightly. His JP drainage is serosanguineous and the site is okay.  I removed the JP drain without difficulty.  Assessment and plan: Doing well without complication identified. We discussed continued activity restrictions. He will return in 4 weeks and likely can return to work at about that time.

## 2012-09-06 ENCOUNTER — Encounter: Payer: Self-pay | Admitting: Nurse Practitioner

## 2012-09-06 ENCOUNTER — Telehealth (INDEPENDENT_AMBULATORY_CARE_PROVIDER_SITE_OTHER): Payer: Self-pay | Admitting: General Surgery

## 2012-09-06 ENCOUNTER — Ambulatory Visit (INDEPENDENT_AMBULATORY_CARE_PROVIDER_SITE_OTHER): Payer: 59 | Admitting: Nurse Practitioner

## 2012-09-06 VITALS — BP 128/70 | HR 84 | Ht 70.0 in | Wt 208.4 lb

## 2012-09-06 DIAGNOSIS — I498 Other specified cardiac arrhythmias: Secondary | ICD-10-CM

## 2012-09-06 DIAGNOSIS — I77810 Thoracic aortic ectasia: Secondary | ICD-10-CM

## 2012-09-06 DIAGNOSIS — I471 Supraventricular tachycardia: Secondary | ICD-10-CM

## 2012-09-06 DIAGNOSIS — I7789 Other specified disorders of arteries and arterioles: Secondary | ICD-10-CM | POA: Insufficient documentation

## 2012-09-06 LAB — CBC WITH DIFFERENTIAL/PLATELET
Basophils Absolute: 0 10*3/uL (ref 0.0–0.1)
Basophils Relative: 0.2 % (ref 0.0–3.0)
Eosinophils Absolute: 0.2 10*3/uL (ref 0.0–0.7)
Eosinophils Relative: 1.8 % (ref 0.0–5.0)
HCT: 42.5 % (ref 39.0–52.0)
Hemoglobin: 13.9 g/dL (ref 13.0–17.0)
Lymphocytes Relative: 25.8 % (ref 12.0–46.0)
Lymphs Abs: 2.7 10*3/uL (ref 0.7–4.0)
MCHC: 32.8 g/dL (ref 30.0–36.0)
MCV: 84.3 fl (ref 78.0–100.0)
Monocytes Absolute: 0.8 10*3/uL (ref 0.1–1.0)
Monocytes Relative: 7.4 % (ref 3.0–12.0)
Neutro Abs: 6.9 10*3/uL (ref 1.4–7.7)
Neutrophils Relative %: 64.8 % (ref 43.0–77.0)
Platelets: 293 10*3/uL (ref 150.0–400.0)
RBC: 5.04 Mil/uL (ref 4.22–5.81)
RDW: 13.4 % (ref 11.5–14.6)
WBC: 10.6 10*3/uL — ABNORMAL HIGH (ref 4.5–10.5)

## 2012-09-06 LAB — BASIC METABOLIC PANEL
BUN: 10 mg/dL (ref 6–23)
CO2: 24 mEq/L (ref 19–32)
Calcium: 9.3 mg/dL (ref 8.4–10.5)
Chloride: 105 mEq/L (ref 96–112)
Creatinine, Ser: 1 mg/dL (ref 0.4–1.5)
GFR: 77.52 mL/min (ref >60.00)
Glucose, Bld: 80 mg/dL (ref 70–99)
Potassium: 4.1 mEq/L (ref 3.5–5.1)
Sodium: 139 mEq/L (ref 135–145)

## 2012-09-06 NOTE — Progress Notes (Signed)
Stann Mainland Date of Birth: [birth year] Medical Record #[id number]  History of Present Illness: Mr. Vivona is seen back today for a post hospital visit. He is seen for Dr. [place]. He has had recent surgery for a ventral hernia. Developed SVT with a heart rate of 180. He has had this before. Treated with diltiazem. His other issues include past SVT - all occurring and resolved in the per-operative period after previous abdominal surgeries. No CAD or CHF. Last stress test in 2010 showing no ischemia and EF of 57%.   He comes in today. He is here alone. He is sore, but doing ok. No complaints of palpitations. Was told that he could stop the diltiazem as an outpatient. No chest pain. Not short of breath. He does check his pulse fairly regularly. He has been asymptomatic with his SVT. He is asking about his echo results today.   Current Outpatient Prescriptions on File Prior to Visit  Medication Sig Dispense Refill  . diltiazem (CARDIZEM CD) 120 MG 24 hr capsule Take 1 capsule (120 mg total) by mouth daily.  30 capsule  2  . HYDROcodone-acetaminophen (NORCO/VICODIN) 5-325 MG per tablet Take 1 tablet by mouth every 6 (six) hours as needed for pain.  30 tablet  0  . ibuprofen (ADVIL,MOTRIN) 200 MG tablet Take 200 mg by mouth every 6 (six) hours as needed. Pain      . Multiple Vitamin (MULTIVITAMIN WITH MINERALS) TABS Take 1 tablet by mouth daily.      . temazepam (RESTORIL) 30 MG capsule Take 1 capsule (30 mg total) by mouth at bedtime as needed for sleep.  30 capsule  5  . oxyCODONE-acetaminophen (PERCOCET/ROXICET) 5-325 MG per tablet Take 1-2 tablets by mouth every 4 (four) hours as needed.  50 tablet  0  . Vitamin D, Ergocalciferol, (DRISDOL) 50000 UNITS CAPS Take 1 capsule (50,000 Units total) by mouth 2 (two) times a week.  8 capsule  2   No current facility-administered medications on file prior to visit.    Allergies  Allergen Reactions  . Shellfish Allergy     Throat stated to close     Past Medical History  Diagnosis Date  . Diverticulitis of colon   . Hepatitis 1975    unknown type   . Asthma   . GERD (gastroesophageal reflux disease)   . Hiatal hernia   . BPH (benign prostatic hypertrophy) with urinary obstruction   . Psoriasis     sees Dr. Karene Fry at [facility].  . Ileus following gastrointestinal surgery 12/26/2011  . Dilated aortic root     noted on echo 08/2012  . SVT (supraventricular tachycardia)     Past Surgical History  Procedure Laterality Date  . Hemicolectomy      left side, at [facility]  . Ileostomy    . Colonoscopy    . Cystoscopy    . Bronchoscopy    . Tonsillectomy    . Vasectomy    . Laparotomy  12/19/2011    Procedure: EXPLORATORY LAPAROTOMY;  Surgeon: Mariella Saa, MD;  Location: [facility];  Service: General;  Laterality: N/A;  . Bowel resection  12/19/2011    Procedure: SMALL BOWEL RESECTION;  Surgeon: Mariella Saa, MD;  Location: [facility];  Service: General;  Laterality: N/A;  with anastamosis and insertion mesh  . Colon surgery  01/2004  . Hernia repair      [phone] incisional hernia  . Ileostomy closure    . Ventral hernia  repair  07/31/2012    Procedure: HERNIA REPAIR VENTRAL ADULT;  Surgeon: Mariella Saa, MD;  Location: [facility];  Service: General;  Laterality: N/A;  . Insertion of mesh  07/31/2012    Procedure: INSERTION OF MESH;  Surgeon: Mariella Saa, MD;  Location: [facility];  Service: General;;    History  Smoking status  . Never Smoker   Smokeless tobacco  . Never Used    History  Alcohol Use  . Yes    Comment: once a month or less    Family History  Problem Relation Age of Onset  . Cancer Mother     lung  . Cancer Father     leukemia  . Hypertension Father   . Heart disease Father   . Heart attack Father     Review of Systems: The review of systems is per the HPI.  All other systems were reviewed and are negative.  Physical Exam: BP 128/70  Pulse 84  Ht 5\' 10"  (1.778 m)  Wt  208 lb 6.4 oz (94.53 kg)  BMI 29.9 kg/m2  LABORATORY DATA:  Echo Study Conclusions  - Left ventricle: The cavity size was normal. Wall thickness was normal. Systolic function was normal. The estimated ejection fraction was in the range of 60% to 65%. Indeterminant diastolic function. Wall motion was normal; there were no regional wall motion abnormalities. - Aortic valve: There was no stenosis. - Aorta: Mildly dilated aortic root and ascending aorta. Aortic root dimension: 38mm (ED). Ascending aortic diameter: 40mm (S). - Mitral valve: Trivial regurgitation. - Left atrium: The atrium was mildly dilated. - Right ventricle: The cavity size was mildly dilated. Systolic function was normal. - Right atrium: The atrium was mildly dilated. - Pulmonary arteries: No complete TR doppler jet so unable to estimate PA systolic pressure. - Systemic veins: IVC was not visualized.  Lab Results  Component Value Date   WBC 9.2 08/06/2012   HGB 10.1* 08/06/2012   HCT 30.9* 08/06/2012   PLT 240 08/06/2012   GLUCOSE 119* 08/07/2012   CHOL 152 05/16/2012   TRIG 218.0* 05/16/2012   HDL 30.90* 05/16/2012   LDLDIRECT 92.0 05/16/2012   LDLCALC  Value: 53        Total Cholesterol/HDL:CHD Risk Coronary Heart Disease Risk Table                     Men   Women  1/2 Average Risk   3.4   3.3  Average Risk       5.0   4.4  2 X Average Risk   9.6   7.1  3 X Average Risk  23.4   11.0        Use the calculated Patient Ratio above and the CHD Risk Table to determine the patient's CHD Risk.        ATP III CLASSIFICATION (LDL):  <100     mg/dL   Optimal  161-096  mg/dL   Near or Above                    Optimal  130-159  mg/dL   Borderline  045-409  mg/dL   High  >811     mg/dL   Very High 03/30/7828   ALT 26 05/16/2012   AST 37 05/16/2012   NA 138 08/07/2012   K 3.0* 08/07/2012   CL 105 08/07/2012   CREATININE 0.87 08/07/2012   BUN 8 08/07/2012   CO2 26  08/07/2012   TSH 0.87 05/16/2012   PSA 1.39 09/17/2007   INR 1.13  12/19/2011    Assessment / Plan: 1. SVT - doing well. Ok to stop his Diltiazem when finished with his current supply. His spells have all been related to surgery. If he has recurrence, would consider ablation.   2. Hypokalemia/anemia - will recheck his labs today  3. Dilated aortic root - mild per echo - will need periodic follow up. Patient is aware.   We will tentatively see him back in 6 months.   Patient is agreeable to this plan and will call if any problems develop in the interim.

## 2012-09-06 NOTE — Patient Instructions (Addendum)
Stay on your current medicines  Finish your current bottle of Diltiazem and you may discontinue  We will see you back in 6 months  We need to check labs today  Call the [facility] office at [phone] if you have any questions, problems or concerns.

## 2012-09-06 NOTE — Telephone Encounter (Signed)
Pt called to report he is having intermittent sharp pain in the LUQ, away from surgical site.  He denies fever or swelling or redness in that area.  Suggested it may be from intestinal gas and he should try walking AMAP, especially at this point in his recovery (surgery on 07/31/12.)  Try increasing Miralax to BID, watch food choices to decrease gas formation at all and can try warm heat to the site.  Call back if pain persists or increases in duration/ intensity or if develops a fever.  Also he requested another round of pain medicine.  Norco has been called in twice already, so paged and updated Dr. Johna Sheriff, who OKd more.  Hydrocodone 5/325 mg, # 30, 1 po Q4-6H prn pain, no refill called to [facility]:  [phone].  Pt is aware.

## 2012-09-25 ENCOUNTER — Encounter (INDEPENDENT_AMBULATORY_CARE_PROVIDER_SITE_OTHER): Payer: Self-pay | Admitting: General Surgery

## 2012-09-25 ENCOUNTER — Ambulatory Visit (INDEPENDENT_AMBULATORY_CARE_PROVIDER_SITE_OTHER): Payer: 59 | Admitting: General Surgery

## 2012-09-25 VITALS — BP 134/86 | HR 74 | Temp 97.3°F | Resp 16 | Wt 213.2 lb

## 2012-09-25 DIAGNOSIS — Z09 Encounter for follow-up examination after completed treatment for conditions other than malignant neoplasm: Secondary | ICD-10-CM

## 2012-09-25 NOTE — Progress Notes (Signed)
History: Patient returns for more long-term followup after extensive open repair of his multiply recurrent ventral incisional hernia. He still has a low soreness but is continuing to improve and has no major complaints. He is happy with his progress.  Exam: He appears well. With the patient standing and coughing and straining his repair feels solid and his wound is well healed.  Assessment and plan: Doing well following his extensive ventral hernia repair without complication identified. He is released to return to work. He will followup here as needed.

## 2013-01-08 ENCOUNTER — Ambulatory Visit (INDEPENDENT_AMBULATORY_CARE_PROVIDER_SITE_OTHER): Payer: 59 | Admitting: General Surgery

## 2013-01-08 ENCOUNTER — Encounter (INDEPENDENT_AMBULATORY_CARE_PROVIDER_SITE_OTHER): Payer: Self-pay | Admitting: General Surgery

## 2013-01-08 VITALS — BP 126/78 | HR 78 | Temp 97.6°F | Resp 16 | Ht 70.0 in | Wt 230.6 lb

## 2013-01-08 DIAGNOSIS — Z09 Encounter for follow-up examination after completed treatment for conditions other than malignant neoplasm: Secondary | ICD-10-CM

## 2013-01-08 NOTE — Progress Notes (Signed)
History: Patient returns to the office about 6 months following extensive repair of multiply recurrent ventral hernia with component release and mesh. About 2 weeks ago he developed significant sharp pain just to the right of his midline incision. No particular activity started this other than he had stopped wearing his binder couple of days prior to this. The pain however improved a lot over the next couple of days and now is just a tiny bit sore and not interfering with his activity. He however was concerned and wanted to be checked.  Exam: Examination of his abdomen shows a well-healed midline wound. The repair feels quite solid with no bulging or any evidence of recurrent hernia or tenderness on exam.  Assessment plan: I do not see any evidence of complication or recurrent hernia. Possible muscle strain. He is reassured and will call as needed.

## 2013-01-09 ENCOUNTER — Other Ambulatory Visit: Payer: Self-pay | Admitting: Family Medicine

## 2013-01-10 NOTE — Telephone Encounter (Signed)
Okay for 6 months 

## 2013-01-14 ENCOUNTER — Encounter: Payer: Self-pay | Admitting: Family Medicine

## 2013-01-14 ENCOUNTER — Ambulatory Visit (INDEPENDENT_AMBULATORY_CARE_PROVIDER_SITE_OTHER): Payer: 59 | Admitting: Family Medicine

## 2013-01-14 VITALS — BP 148/78 | HR 90 | Temp 97.8°F | Wt 234.0 lb

## 2013-01-14 DIAGNOSIS — K6289 Other specified diseases of anus and rectum: Secondary | ICD-10-CM

## 2013-01-14 DIAGNOSIS — E559 Vitamin D deficiency, unspecified: Secondary | ICD-10-CM

## 2013-01-14 MED ORDER — KETOCONAZOLE 2 % EX CREA: TOPICAL_CREAM | Freq: Three times a day (TID) | CUTANEOUS | Status: DC

## 2013-01-14 NOTE — Progress Notes (Signed)
  Subjective:    Patient ID: [name], male    DOB: 04/11/1951, 62 y.o.   MRN: [id number]  HPI Here for 2 months of constant irritation, burning, and itching around the anus. His BMs are loose but regular. He has tried Preparation H as well as A and D ointment with no benefit.   Review of Systems  Constitutional: Negative.   Gastrointestinal: Positive for rectal pain. Negative for nausea, vomiting, abdominal pain, diarrhea, constipation, blood in stool, abdominal distention and anal bleeding.       Objective:   Physical Exam  Constitutional: He appears well-developed and well-nourished.  Genitourinary:  Irritated skin around the anus. No hemorrhoids           Assessment & Plan:  Probable Candidal rash. Try ketoconazole cream

## 2013-01-15 MED ORDER — VITAMIN D (ERGOCALCIFEROL) 1.25 MG (50000 UNIT) PO CAPS: 50000.0000 [IU] | ORAL_CAPSULE | ORAL | Status: DC

## 2013-01-15 NOTE — Addendum Note (Signed)
Addended by: Aggie Hacker A on: 01/15/2013 04:45 PM   Modules accepted: Orders

## 2013-01-15 NOTE — Progress Notes (Signed)
Quick Note:  I spoke with pt sent script e-scribe ______

## 2013-02-27 ENCOUNTER — Other Ambulatory Visit: Payer: Self-pay | Admitting: Family Medicine

## 2013-03-31 ENCOUNTER — Other Ambulatory Visit: Payer: Self-pay | Admitting: Family Medicine

## 2013-05-16 ENCOUNTER — Ambulatory Visit (INDEPENDENT_AMBULATORY_CARE_PROVIDER_SITE_OTHER): Payer: 59 | Admitting: Family Medicine

## 2013-05-16 ENCOUNTER — Encounter: Payer: Self-pay | Admitting: Family Medicine

## 2013-05-16 VITALS — BP 130/70 | HR 80 | Temp 97.8°F | Wt 240.0 lb

## 2013-05-16 DIAGNOSIS — L538 Other specified erythematous conditions: Secondary | ICD-10-CM

## 2013-05-16 DIAGNOSIS — K6289 Other specified diseases of anus and rectum: Secondary | ICD-10-CM

## 2013-05-16 DIAGNOSIS — Z23 Encounter for immunization: Secondary | ICD-10-CM

## 2013-05-16 MED ORDER — TRIAMCINOLONE ACETONIDE 0.1 % EX CREA: TOPICAL_CREAM | Freq: Two times a day (BID) | CUTANEOUS | Status: DC

## 2013-05-16 NOTE — Progress Notes (Signed)
  Subjective:    Patient ID: [name], male    DOB: June 13, 1951, 62 y.o.   MRN: [id number]  HPI Here to discuss perianal irritation that has not responded to treatment with ketoconazole cream. The area is about the same as before. Of note, he has been treated for psoriasis in the past and he still has some small patches of this on the elbows.    Review of Systems  Constitutional: Negative.   Skin: Positive for rash.       Objective:   Physical Exam  Constitutional: He appears well-developed and well-nourished.  Genitourinary:  Mild erythema around the anus          Assessment & Plan:  This is probably psoriasis so he will try Triamcinolone cream.

## 2013-06-23 ENCOUNTER — Other Ambulatory Visit: Payer: Self-pay | Admitting: Family Medicine

## 2013-06-24 NOTE — Telephone Encounter (Signed)
Refill for 6 months. 

## 2013-07-15 ENCOUNTER — Other Ambulatory Visit: Payer: Self-pay | Admitting: Family Medicine

## 2013-07-23 ENCOUNTER — Encounter: Payer: Self-pay | Admitting: *Deleted

## 2013-07-24 ENCOUNTER — Ambulatory Visit (INDEPENDENT_AMBULATORY_CARE_PROVIDER_SITE_OTHER): Payer: 59 | Admitting: Family Medicine

## 2013-07-24 ENCOUNTER — Encounter: Payer: Self-pay | Admitting: Family Medicine

## 2013-07-24 VITALS — BP 132/76 | HR 75 | Temp 97.9°F | Wt 246.0 lb

## 2013-07-24 DIAGNOSIS — K6289 Other specified diseases of anus and rectum: Secondary | ICD-10-CM

## 2013-07-24 MED ORDER — MESALAMINE 1000 MG RE SUPP
1000.0000 mg | Freq: Every day | RECTAL | Status: DC
Start: 2013-07-24 — End: 2013-08-05

## 2013-07-24 NOTE — Progress Notes (Signed)
   Subjective:    Patient ID: [name], male    DOB: 15-May-1951, 63 y.o.   MRN: [id number]  HPI Here for follow up an irritated anus. He has tried ketoconazole and triamcinolone creams with no improvement. The irritation is both internal and external. His BMs are regular and there is nl bleeding.    Review of Systems  Constitutional: Negative.   Gastrointestinal: Positive for rectal pain. Negative for abdominal pain, diarrhea, constipation, blood in stool, abdominal distention and anal bleeding.       Objective:   Physical Exam  Constitutional: He appears well-developed and well-nourished.  Abdominal: Soft. Bowel sounds are normal. He exhibits no distension and no mass. There is no tenderness. There is no rebound and no guarding.          Assessment & Plan:  Possible proctitis. Try Canasa suppositories and we will have him see GI.

## 2013-07-24 NOTE — Progress Notes (Signed)
Pre visit review using our clinic review tool, if applicable. No additional management support is needed unless otherwise documented below in the visit note. 

## 2013-07-30 ENCOUNTER — Encounter: Payer: Self-pay | Admitting: Internal Medicine

## 2013-07-30 NOTE — Telephone Encounter (Signed)
error 

## 2013-08-05 ENCOUNTER — Ambulatory Visit (INDEPENDENT_AMBULATORY_CARE_PROVIDER_SITE_OTHER): Payer: 59 | Admitting: Internal Medicine

## 2013-08-05 ENCOUNTER — Encounter: Payer: Self-pay | Admitting: Internal Medicine

## 2013-08-05 VITALS — BP 152/72 | HR 100 | Ht 70.5 in | Wt 245.5 lb

## 2013-08-05 DIAGNOSIS — L290 Pruritus ani: Secondary | ICD-10-CM

## 2013-08-05 DIAGNOSIS — Z1211 Encounter for screening for malignant neoplasm of colon: Secondary | ICD-10-CM

## 2013-08-05 DIAGNOSIS — K648 Other hemorrhoids: Secondary | ICD-10-CM

## 2013-08-05 MED ORDER — NA SULFATE-K SULFATE-MG SULF 17.5-3.13-1.6 GM/177ML PO SOLN: ORAL | Status: DC

## 2013-08-05 MED ORDER — MENTHOL-ZINC OXIDE 0.44-20.6 % EX OINT: 1.0000 "application " | TOPICAL_OINTMENT | Freq: Three times a day (TID) | CUTANEOUS | Status: DC | PRN

## 2013-08-05 NOTE — Assessment & Plan Note (Signed)
Was not clear. He has not responded to ketoconazole or topical triamcinolone. There is no rash there today really. He says he feels somewhat better with the local care. He does have sweating in that area so it could be a moisture phenomena but he also has a small amount of fecal soilage that may be irritating the anal area and is the source, could be related to hemorrhoids though not clear. I do not see evidence for psoriasis. Will try Calmoseptine ointment. We'll see how that does, I will talk to him again when he comes for a screening colonoscopy we can consider whether or not treating the hemorrhoids would make a difference. He does not really drink a lot of caffeine which can do some of this as well. Fiber supplementation may be in order to try to prevent a small amount of fecal soilage he has.

## 2013-08-05 NOTE — Assessment & Plan Note (Signed)
It is appropriate in a reasonable time interval perform a routine repeat screening colonoscopy.The risks and benefits as well as alternatives of endoscopic procedure(s) have been discussed and reviewed. All questions answered. The patient agrees to proceed.

## 2013-08-05 NOTE — Progress Notes (Signed)
Subjective:  Referred by: [name], MD   Patient ID: [name], male    DOB: January 30, 1951, 63 y.o.   MRN: [id number]  HPI Patient is a very nice 63 year old man here with complaints of rectal pain and irritation. He says it started after or during a hospitalization for hernia repair about one year ago. I reviewed the records from primary care and he said intermittent erythematous rash that has not responded to ketoconazole or triamcinolone. He is using hemorrhoidal wipes and A and D cream or ointment at this point it feels somewhat better but still has painful itching in the area. He has slight stool leakage into the anal area as well but not onto the underwear. There is occasional bright red blood per rectum and maybe a small amount into the commode with the stool. There is a generalized discomfort in the anus. He has been concerned that his psoriasis could be affecting him in this area. He moves his bowels about twice a day and stools tend to be soft  He has a relatively complicated GI history having had a laparoscopic resection for diverticulitis problems with a leak, sepsis treated with diverting ileostomy and then ostomy takedown and reanastomosis. This was in 2005. A full colonoscopy here in [facility] 2004 showed diverticulosis. He has had an upper GI endoscopy with esophageal stricture dilated with a Maloney dilator as well as a hiatal hernia in the past. He thinks he had a repeat colonoscopy in 2005 for his ileostomy takedown and reanastomosis, in [place].  Allergies  Allergen Reactions  . Shellfish Allergy     Throat stated to close   Outpatient Prescriptions Prior to Visit  Medication Sig Dispense Refill  . Multiple Vitamin (MULTIVITAMIN WITH MINERALS) TABS Take 1 tablet by mouth daily.      . temazepam (RESTORIL) 30 MG capsule TAKE 1 CAPSULE AT BEDTIME AS NEEDED  30 capsule  5  . Vitamin D, Ergocalciferol, (DRISDOL) 50000 UNITS CAPS capsule TAKE 1 CAPSULE BY MOUTH  EVERY 7 (SEVEN) DAYS.  12 capsule  0  . ketoconazole (NIZORAL) 2 % cream Apply topically 3 (three) times daily.  60 g  5  . mesalamine (CANASA) 1000 MG suppository Place 1 suppository (1,000 mg total) rectally at bedtime.  30 suppository  2  . triamcinolone cream (KENALOG) 0.1 % Apply topically 2 (two) times daily.  45 g  5   No facility-administered medications prior to visit.   Past Medical History  Diagnosis Date  . Diverticulitis of colon   . Hepatitis 1975    unknown type   . Asthma   . GERD (gastroesophageal reflux disease)   . Hiatal hernia   . BPH (benign prostatic hypertrophy) with urinary obstruction   . Psoriasis     sees [name] at [facility].  . Ileus following gastrointestinal surgery 12/26/2011  . Dilated aortic root     noted on echo 08/2012  . SVT (supraventricular tachycardia)   . H/O: GI bleed    Past Surgical History  Procedure Laterality Date  . Hemicolectomy      left side, at [facility]  . Ileostomy    . Colonoscopy    . Cystoscopy    . Bronchoscopy    . Tonsillectomy    . Vasectomy    . Laparotomy  12/19/2011    Procedure: EXPLORATORY LAPAROTOMY;  Surgeon: [name], MD;  Location: [facility];  Service: General;  Laterality: N/A;  . Bowel resection  12/19/2011    Procedure: SMALL BOWEL RESECTION;  Surgeon: [name], MD;  Location: [facility];  Service: General;  Laterality: N/A;  with anastamosis and insertion mesh  . Colon surgery  01/2004  . Hernia repair      [phone] incisional hernia  . Ileostomy closure    . Ventral hernia repair  07/31/2012    Procedure: HERNIA REPAIR VENTRAL ADULT;  Surgeon: [name], MD;  Location: [facility];  Service: General;  Laterality: N/A;  . Insertion of mesh  07/31/2012    Procedure: INSERTION OF MESH;  Surgeon: [name], MD;  Location: [facility];  Service: General;;  . Esophagogastroduodenoscopy     History   Social History  . Marital Status: Married    Spouse Name: N/A    Number  of Children: 2  . Years of Education: N/A   Occupational History  . EHS manager    Social History Main Topics  . Smoking status: Never Smoker   . Smokeless tobacco: Never Used  . Alcohol Use: Yes     Comment: once a month or less  . Drug Use: No  . Sexual Activity: Yes   Other Topics Concern  . 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 Lung cancer Mother   . Leukemia Father   . Hypertension Father   . Heart disease Father   . Heart attack Father   . Prostate cancer Father   . Prostate cancer Paternal Uncle     Review of Systems Psoriasis mostly on the legs, and fatigue and insomnia all other review of systems negative or as per history of present illness    Objective:   Physical Exam General:  Well-developed, well-nourished and in no acute distress Eyes:  anicteric. ENT:   Mouth and posterior pharynx free of lesions.  Neck:   supple w/o thyromegaly or mass.  Lungs: Clear to auscultation bilaterally. Heart:  S1S2, no rubs, murmurs, gallops. Abdomen:  soft, non-tender, no hepatosplenomegaly, hernia, or mass and BS+.  Rectal: Slight hypopigmentation at 9'o'clock left lateral area (posterior) but no rash/dermatitis or tags  Normal resting tone and squeeze, soft brown stool   Anoscopy was performed with the patient in the left lateral decubitus position and revealed internal hemorrhoids grad 1 all positions with some external components  Lymph:  no cervical or supraclavicular adenopathy. Extremities:   no edema Skin  Psoriatic plaques bilateral legs Neuro:  A&O x 3.  Psych:  appropriate mood and  Affect.   Data Reviewed: Prior 2004 EGD and colonoscopy, primary care notes labs in the EMR    Assessment & Plan:   1. Pruritus ani   2. Hemorrhoids, internal, with bleeding   3. Special screening for  malignant neoplasms, colon    I appreciate the opportunity to care for this patient. CC: [name], MD

## 2013-08-05 NOTE — Assessment & Plan Note (Addendum)
Seen anoscopy today. Think there is a small external component as well. Question if these are related to his pruritus ani, with fecal soilage from hemorrhoids. I explained that this is possible but difficult to know and might require ligation to see.

## 2013-08-05 NOTE — Patient Instructions (Signed)
Today you have been given samples of Calmoseptine ointment to use as directed.  You have been scheduled for a colonoscopy with propofol. Please follow written instructions given to you at your visit today.  Please pick up your prep kit at the pharmacy within the next 1-3 days. If you use inhalers (even only as needed), please bring them with you on the day of your procedure. Your physician has requested that you go to www.startemmi.com and enter the access code given to you at your visit today. This web site gives a general overview about your procedure. However, you should still follow specific instructions given to you by our office regarding your preparation for the procedure.  I appreciate the opportunity to care for you.

## 2013-08-06 ENCOUNTER — Encounter: Payer: Self-pay | Admitting: Internal Medicine

## 2013-08-13 ENCOUNTER — Other Ambulatory Visit: Payer: Self-pay | Admitting: Family Medicine

## 2013-08-13 DIAGNOSIS — E559 Vitamin D deficiency, unspecified: Secondary | ICD-10-CM

## 2013-08-13 NOTE — Telephone Encounter (Signed)
Per Dr. Clent Ridges okay to send in a 1 month supply, pt needs to schedule a lab draw to check vitamin d level. I put future lab order in computer, spoke with pt and sent script e-scribe.

## 2013-08-21 ENCOUNTER — Ambulatory Visit (AMBULATORY_SURGERY_CENTER): Payer: 59 | Admitting: Internal Medicine

## 2013-08-21 ENCOUNTER — Encounter: Payer: Self-pay | Admitting: Internal Medicine

## 2013-08-21 VITALS — BP 126/79 | HR 54 | Temp 96.5°F | Resp 13 | Ht 70.5 in | Wt 245.0 lb

## 2013-08-21 DIAGNOSIS — Z1211 Encounter for screening for malignant neoplasm of colon: Secondary | ICD-10-CM

## 2013-08-21 DIAGNOSIS — IMO0002 Reserved for concepts with insufficient information to code with codable children: Secondary | ICD-10-CM

## 2013-08-21 DIAGNOSIS — K5090 Crohn's disease, unspecified, without complications: Secondary | ICD-10-CM

## 2013-08-21 DIAGNOSIS — K633 Ulcer of intestine: Secondary | ICD-10-CM

## 2013-08-21 HISTORY — DX: Reserved for concepts with insufficient information to code with codable children: IMO0002

## 2013-08-21 MED ORDER — NAPROXEN SODIUM 220 MG PO TABS: ORAL_TABLET | ORAL | Status: DC

## 2013-08-21 MED ORDER — SODIUM CHLORIDE 0.9 % IV SOLN: 500.0000 mL | INTRAVENOUS | Status: DC

## 2013-08-21 NOTE — Op Note (Signed)
[facility]  [address]. [place], [address]   COLONOSCOPY PROCEDURE REPORT  PATIENT: [name], [name]  MR#: [id number] BIRTHDATE: 12-03-1950 , 62  yrs. old GENDER: Male ENDOSCOPIST: Gatha Mayer, MD, [facility] REFERRED WK:[id number] [name], M.D. PROCEDURE DATE:  08/21/2013 PROCEDURE:   Colonoscopy with biopsy First Screening Colonoscopy - Avg.  risk and is 50 yrs.  old or older - No.  Prior Negative Screening - Now for repeat screening. 10 or more years since last screening  History of Adenoma - Now for follow-up colonoscopy & has been > or = to 3 yrs.  N/A  Polyps Removed Today? No.  Recommend repeat exam, <10 yrs? No. ASA CLASS:   Class III INDICATIONS:average risk screening and Last colonoscopy performed 10 years ago. MEDICATIONS: Propofol (Diprivan) 240 mg IV, MAC sedation, administered by CRNA, and These medications were titrated to patient response per physician's verbal order  DESCRIPTION OF PROCEDURE:   After the risks benefits and alternatives of the procedure were thoroughly explained, informed consent was obtained.  A digital rectal exam revealed no rectal mass.   The [id number]  endoscope was introduced through the anus and advanced to the terminal ileum which was intubated for a short distance. No adverse events experienced.   The quality of the prep was excellent using Suprep  The instrument was then slowly withdrawn as the colon was fully examined.  COLON FINDINGS: Multiple small ulcers were found in the terminal ileum.  Multiple biopsies were performed using cold forceps. There was mild scattered diverticulosis noted in the left colon. The patient is s/p a prior end-to-end colo-colonic surgical anastomosis in the left colon by history and colon was short but could not ID anastomosis scars.   The colon mucosa was otherwise normal.   A right colon retroflexion was performed.  Retroflexed views revealed internal/external hemorrhoids. The time  to cecum=minutes 46 seconds.  Withdrawal time=8 minutes 59 seconds. The scope was withdrawn and the procedure completed. COMPLICATIONS: There were no complications.  ENDOSCOPIC IMPRESSION: 1.   Small ulcers in the terminal ileum; multiple biopsies were performed using cold forceps 2.   There was mild diverticulosis noted in the left colon 3.   S/P a prior colo-colonic surgical anastomosis in the left colon - by hx, not seen but colon is short 4.   The colon mucosa was otherwise normal 5.   Internal/external hemorrhoids - may be cause of anal sxs - can consider internla ligation if he desires  RECOMMENDATIONS: 1.  Await biopsy results - anticipate repeating colonoscopy routinely in 2025 2.  Avoid NSAIDS likely recommendation as he takes Aleve regularly and probably causing small bowel ulcers   eSigned:  Gatha Mayer, MD, [facility] 08/21/2013 12:03 PM cc: [name], MD and The Patient

## 2013-08-21 NOTE — Patient Instructions (Addendum)
I found ulcers in the small intestine - cause not clear. I took biopsies and will let you know what the pathologist tells us. One possibility is Crohn's disease but another is side effects of anti-inflammatory medications like the Aleve you take. (I added this to your medication list since you told me you take it.).  No colon polyps - you do have some mild diverticulosis and the hemorrhoids.  I will contact you with recommendations once I get the biopsy results but you may need to stop taking aleve or other similar medications. You may consider having hemorrhoids banded to see if that helps your anal problems. I cannot promise that would work but it might.  Next routine colonoscopy in 10 years - 2025  I appreciate the opportunity to care for you. Iva Booparl E. Reeanna Acri, MD, FACG   YOU HAD AN ENDOSCOPIC PROCEDURE TODAY AT THE [facility]: Refer to the procedure report that was given to you for any specific questions about what was found during the examination.  If the procedure report does not answer your questions, please call your gastroenterologist to clarify.  If you requested that your care partner not be given the details of your procedure findings, then the procedure report has been included in a sealed envelope for you to review at your convenience later.  YOU SHOULD EXPECT: Some feelings of bloating in the abdomen. Passage of more gas than usual.  Walking can help get rid of the air that was put into your GI tract during the procedure and reduce the bloating. If you had a lower endoscopy (such as a colonoscopy or flexible sigmoidoscopy) you may notice spotting of blood in your stool or on the toilet paper. If you underwent a bowel prep for your procedure, then you may not have a normal bowel movement for a few days.  DIET: Your first meal following the procedure should be a light meal and then it is ok to progress to your normal diet.  A half-sandwich or bowl of soup is an example of a  good first meal.  Heavy or fried foods are harder to digest and may make you feel nauseous or bloated.  Likewise meals heavy in dairy and vegetables can cause extra gas to form and this can also increase the bloating.  Drink plenty of fluids but you should avoid alcoholic beverages for 24 hours.  ACTIVITY: Your care partner should take you home directly after the procedure.  You should plan to take it easy, moving slowly for the rest of the day.  You can resume normal activity the day after the procedure however you should NOT DRIVE or use heavy machinery for 24 hours (because of the sedation medicines used during the test).    SYMPTOMS TO REPORT IMMEDIATELY: A gastroenterologist can be reached at any hour.  During normal business hours, 8:30 AM to 5:00 PM Monday through Friday, call [phone].  After hours and on weekends, please call the GI answering service at [phone] who will take a message and have the physician on call contact you.   Following lower endoscopy (colonoscopy or flexible sigmoidoscopy):  Excessive amounts of blood in the stool  Significant tenderness or worsening of abdominal pains  Swelling of the abdomen that is new, acute  Fever of 100F or higher   FOLLOW UP: If any biopsies were taken you will be contacted by phone or by letter within the next 1-3 weeks.  Call your gastroenterologist if you have not heard  about the biopsies in 3 weeks.  Our staff will call the home number listed on your records the next business day following your procedure to check on you and address any questions or concerns that you may have at that time regarding the information given to you following your procedure. This is a courtesy call and so if there is no answer at the home number and we have not heard from you through the emergency physician on call, we will assume that you have returned to your regular daily activities without incident.  SIGNATURES/CONFIDENTIALITY: You and/or your  care partner have signed paperwork which will be entered into your electronic medical record.  These signatures attest to the fact that that the information above on your After Visit Summary has been reviewed and is understood.  Full responsibility of the confidentiality of this discharge information lies with you and/or your care-partner.  Diverticulosis, hemorrhoids-handouts given  Avoid NSAIDS (ibuprofen, motrin, advil, aleve, naproxen, mobic, etc)

## 2013-08-21 NOTE — Progress Notes (Signed)
Called to room to assist during endoscopic procedure.  Patient ID and intended procedure confirmed with present staff. Received instructions for my participation in the procedure from the performing physician.  

## 2013-08-21 NOTE — Progress Notes (Signed)
Report to pacu rn, vss, bbs=clear 

## 2013-08-22 ENCOUNTER — Telehealth: Payer: Self-pay | Admitting: *Deleted

## 2013-08-22 NOTE — Telephone Encounter (Signed)
No answer, left message to call if questions or concerns. 

## 2013-08-27 NOTE — Progress Notes (Signed)
Quick Note:  Ileal ulcers - NSAID's vs. Crohn's - was on aleve  1) Office to call and ask him to schedule a follow-up appointment to review his situation (ileal ulcers, hemorrhoids) 2) [facility] - colonoscopy recall 2025 ______

## 2013-09-30 ENCOUNTER — Ambulatory Visit: Payer: 59 | Admitting: Internal Medicine

## 2013-10-13 ENCOUNTER — Encounter: Payer: Self-pay | Admitting: Gastroenterology

## 2013-11-18 ENCOUNTER — Ambulatory Visit: Payer: 59 | Admitting: Internal Medicine

## 2013-12-01 ENCOUNTER — Ambulatory Visit (INDEPENDENT_AMBULATORY_CARE_PROVIDER_SITE_OTHER): Payer: 59 | Admitting: Family Medicine

## 2013-12-01 ENCOUNTER — Encounter: Payer: Self-pay | Admitting: Family Medicine

## 2013-12-01 VITALS — BP 154/89 | HR 69 | Temp 98.1°F | Ht 70.5 in | Wt 236.0 lb

## 2013-12-01 DIAGNOSIS — J209 Acute bronchitis, unspecified: Secondary | ICD-10-CM

## 2013-12-01 MED ORDER — LEVOFLOXACIN 500 MG PO TABS: 500.0000 mg | ORAL_TABLET | Freq: Every day | ORAL | Status: AC

## 2013-12-01 MED ORDER — ALBUTEROL SULFATE HFA 108 (90 BASE) MCG/ACT IN AERS: 2.0000 | INHALATION_SPRAY | RESPIRATORY_TRACT | Status: DC | PRN

## 2013-12-01 MED ORDER — METHYLPREDNISOLONE 4 MG PO KIT: PACK | ORAL | Status: AC

## 2013-12-01 NOTE — Progress Notes (Signed)
   Subjective:    Patient ID: [name], male    DOB: Jun 11, 1951, 63 y.o.   MRN: [id number]  HPI Here to follow up on bronchitis. While he was working in [place], [place] for his [employer], he developed chest tightness and coughing up green sputum. He went to an Urgent Care there about 3 weeks ago and was given a Zpack. This did not help so he went back on 11-26-13 and was given Augmentin. This has not helped either. He is still coughing, and he uses his nebulizer once or twice a day.    Review of Systems  Constitutional: Negative.   HENT: Negative.   Eyes: Negative.   Respiratory: Positive for cough, chest tightness and wheezing. Negative for shortness of breath.   Cardiovascular: Negative.        Objective:   Physical Exam  Constitutional: He appears well-developed and well-nourished.  HENT:  Right Ear: External ear normal.  Left Ear: External ear normal.  Nose: Nose normal.  Mouth/Throat: Oropharynx is clear and moist.  Eyes: Conjunctivae are normal.  Pulmonary/Chest: Effort normal. No respiratory distress. He has no rales.  Scattered wheezes and rhonchi   Lymphadenopathy:    He has no cervical adenopathy.          Assessment & Plan:  Given Levaquin and a steroid taper.

## 2013-12-01 NOTE — Progress Notes (Signed)
Pre visit review using our clinic review tool, if applicable. No additional management support is needed unless otherwise documented below in the visit note. 

## 2013-12-18 ENCOUNTER — Encounter: Payer: Self-pay | Admitting: Physician Assistant

## 2013-12-18 ENCOUNTER — Ambulatory Visit (INDEPENDENT_AMBULATORY_CARE_PROVIDER_SITE_OTHER): Payer: 59 | Admitting: Physician Assistant

## 2013-12-18 VITALS — BP 130/86 | HR 76 | Temp 98.6°F | Resp 18 | Wt 240.0 lb

## 2013-12-18 DIAGNOSIS — M7989 Other specified soft tissue disorders: Secondary | ICD-10-CM

## 2013-12-18 DIAGNOSIS — N529 Male erectile dysfunction, unspecified: Secondary | ICD-10-CM

## 2013-12-18 MED ORDER — TADALAFIL 2.5 MG PO TABS: 2.5000 mg | ORAL_TABLET | Freq: Every day | ORAL | Status: DC | PRN

## 2013-12-18 NOTE — Progress Notes (Signed)
Subjective:    Patient ID: [name], male    DOB: Oct 08, 1950, 63 y.o.   MRN: [id number]  HPI Patient is Callahan 63 year old Caucasian male presents to the clinic for right under arm swelling. Patient states that 4 days ago he noticed Callahan "lemon-size" amount of swelling in his right armpit. He states that this is not red or painful to the touch, just swollen. He states he is unsure if it slowly developed over Callahan period of time or rapidly developed. He just remembers waking up one morning and noticing it. He has not tried anything to relieve this. He denies fevers, chills, nausea, vomiting, diarrhea, shortness of breath, and chest pain.   Patient also suffers from erectile dysfunction, and asks if he can have Callahan renewal for his prescription of Cialis sent to the pharmacy.  Review of Systems As per the history of present illness and otherwise negative.     Past Medical History  Diagnosis Date  . Diverticulitis of colon   . Hepatitis 1975    unknown type   . Asthma   . GERD (gastroesophageal reflux disease)   . Hiatal hernia   . BPH (benign prostatic hypertrophy) with urinary obstruction   . Psoriasis     [facility]  . Ileus following gastrointestinal surgery 12/26/2011  . Dilated aortic root     noted on echo 08/2012  . SVT (supraventricular tachycardia)   . H/O: GI bleed   . EPIDIDYMITIS 02/15/2010    Qualifier: Diagnosis of  By: [name]   . Acute prostatitis 07/24/2007    Qualifier: Diagnosis of  By: [name]   . HERPES SIMPLEX INFECTION 10/14/2007    Qualifier: Diagnosis of  By: [name]   . Recurrent ventral incisional hernia 05/10/2012  . Ulcer 08/21/2013    ileal   Past Surgical History  Procedure Laterality Date  . Hemicolectomy      left side, [facility]  . Ileostomy    . Colonoscopy    . Cystoscopy    . Bronchoscopy    . Tonsillectomy    . Vasectomy    . Laparotomy  12/19/2011    Procedure: EXPLORATORY LAPAROTOMY;   Surgeon: [name];  Location: [facility];  Service: General;  Laterality: N/Callahan;  . Bowel resection  12/19/2011    Procedure: SMALL BOWEL RESECTION;  Surgeon: [name];  Location: [facility];  Service: General;  Laterality: N/Callahan;  with anastamosis and insertion mesh  . Colon surgery  01/2004  . Hernia repair      [phone] incisional hernia  . Ileostomy closure    . Ventral hernia repair  07/31/2012    Procedure: HERNIA REPAIR VENTRAL ADULT;  Surgeon: [name];  Location: [facility];  Service: General;  Laterality: N/Callahan;  . Insertion of mesh  07/31/2012    Procedure: INSERTION OF MESH;  Surgeon: [name];  Location: [facility];  Service: General;;  . Esophagogastroduodenoscopy    . Colonoscopy w/ biopsies  08/21/2013    reports that he has never smoked. He has never used smokeless tobacco. He reports that he drinks alcohol. He reports that he does not use illicit drugs. family history includes Heart attack in his father; Heart disease in his father; Hypertension in his father; Leukemia in his father; Lung cancer in his mother; Prostate cancer in his father and paternal uncle. There is no history  of Colon cancer. Allergies  Allergen Reactions  . Shellfish Allergy     Throat stated to close       Objective:   Physical Exam  Nursing note and vitals reviewed. Constitutional: He is oriented to person, place, and time. He appears well-developed and well-nourished. No distress.  HENT:  Head: Normocephalic and atraumatic.  Eyes: Conjunctivae and EOM are normal. Pupils are equal, round, and reactive to light.  Neck: Normal range of motion. Neck supple.  Cardiovascular: Normal rate, regular rhythm, normal heart sounds and intact distal pulses.  Exam reveals no gallop and no friction rub.   No murmur heard. Pulmonary/Chest: Effort normal and breath sounds normal. No respiratory distress. He has no wheezes. He has no rales. He exhibits no tenderness.    Musculoskeletal: Normal range of motion.  Neurological: He is alert and oriented to person, place, and time.  Skin: Skin is warm and dry. No rash noted. He is not diaphoretic. No erythema. No pallor.  Right armpit: There is Callahan slightly significant amount of swelling, asymmetric compared with the left armpit. There is no palpable mass within the tissue of the armpit. There is no tenderness to palpation, erythema, fluctuance, or excessive warmth.  Left armpit: There is Callahan similar swelling under the left armpit, however slightly less than that of the right armpit.  Psychiatric: He has Callahan normal mood and affect. His behavior is normal. Judgment and thought content normal.   Filed Vitals:   12/18/13 1553  BP: 130/86  Pulse: 76  Temp: 98.6 F (37 C)  Resp: 18    Lab Results  Component Value Date   WBC 10.6* 09/06/2012   HGB 13.9 09/06/2012   HCT 42.5 09/06/2012   PLT 293.0 09/06/2012   GLUCOSE 80 09/06/2012   CHOL 152 05/16/2012   TRIG 218.0* 05/16/2012   HDL 30.90* 05/16/2012   LDLDIRECT 92.0 05/16/2012   LDLCALC  Value: 53        Total Cholesterol/HDL:CHD Risk Coronary Heart Disease Risk Table                     Men   Women  1/2 Average Risk   3.4   3.3  Average Risk       5.0   4.4  2 X Average Risk   9.6   7.1  3 X Average Risk  23.4   11.0        Use the calculated Patient Ratio above and the CHD Risk Table to determine the patient's CHD Risk.        ATP III CLASSIFICATION (LDL):  <100     mg/dL   Optimal  098-119  mg/dL   Near or Above                    Optimal  130-159  mg/dL   Borderline  147-829  mg/dL   High  >562     mg/dL   Very High 08/16/8655   ALT 26 05/16/2012   AST 37 05/16/2012   NA 139 09/06/2012   K 4.1 09/06/2012   CL 105 09/06/2012   CREATININE 1.0 09/06/2012   BUN 10 09/06/2012   CO2 24 09/06/2012   TSH 0.87 05/16/2012   PSA 1.39 09/17/2007   INR 1.13 12/19/2011       Assessment & Plan:  [name] was seen today for right arm swelling.  Diagnoses and associated orders for  this visit:  ED (erectile dysfunction) Comments:  Stable with cialis. Will refill. - Tadalafil (CIALIS) 2.5 MG TABS; Take 1 tablet (2.5 mg total) by mouth daily as needed for erectile dysfunction.  Swelling in right armpit Comments: Likely just accumulated fat overtime that has gone unnoticed until recently. Pt will continue to monitor for changes.   Pt will monitor armpit swelling for any increased swelling, pain, redness, or excessive warm, which may be from Callahan developing cellulitis.  Plan to follow up as needed.  Patient Instructions  Continue to monitor the area of swelling under your arms for any changes. If there is increased pain, swelling, redness, and fever in the area, return to clinic as you may have developed Callahan skin infection.  As it is likely and increase in Callahan normal fat pad distribution, there may be some benefit from diet and exercise.  Followup as needed, or for worsening symptoms such as those mentioned above.

## 2013-12-18 NOTE — Patient Instructions (Signed)
Continue to monitor the area of swelling under your arms for any changes. If there is increased pain, swelling, redness, and fever in the area, return to clinic as you may have developed a skin infection.  As it is likely and increase in a normal fat pad distribution, there may be some benefit from diet and exercise.  Followup as needed, or for worsening symptoms such as those mentioned above.   Health Maintenance, Males A healthy lifestyle and preventative care can promote health and wellness.  Maintain regular health, dental, and eye exams.  Eat a healthy diet. Foods like vegetables, fruits, whole grains, low-fat dairy products, and lean protein foods contain the nutrients you need and are low in calories. Decrease your intake of foods high in solid fats, added sugars, and salt. Get information about a proper diet from your health care provider, if necessary.  Regular physical exercise is one of the most important things you can do for your health. Most adults should get at least 150 minutes of moderate-intensity exercise (any activity that increases your heart rate and causes you to sweat) each week. In addition, most adults need muscle-strengthening exercises on 2 or more days a week.   Maintain a healthy weight. The body mass index (BMI) is a screening tool to identify possible weight problems. It provides an estimate of body fat based on height and weight. Your health care provider can find your BMI and can help you achieve or maintain a healthy weight. For males 20 years and older:  A BMI below 18.5 is considered underweight.  A BMI of 18.5 to 24.9 is normal.  A BMI of 25 to 29.9 is considered overweight.  A BMI of 30 and above is considered obese.  Maintain normal blood lipids and cholesterol by exercising and minimizing your intake of saturated fat. Eat a balanced diet with plenty of fruits and vegetables. Blood tests for lipids and cholesterol should begin at age 68 and be  repeated every 5 years. If your lipid or cholesterol levels are high, you are over 50, or you are at high risk for heart disease, you may need your cholesterol levels checked more frequently.Ongoing high lipid and cholesterol levels should be treated with medicines, if diet and exercise are not working.  If you smoke, find out from your health care provider how to quit. If you do not use tobacco, do not start.  Lung cancer screening is recommended for adults aged 47 80 years who are at high risk for developing lung cancer because of a history of smoking. A yearly low-dose CT scan of the lungs is recommended for people who have at least a 30-pack-year history of smoking and are a current smoker or have quit within the past 15 years. A pack year of smoking is smoking an average of 1 pack of cigarettes a day for 1 year (for example, a 30-pack-year history of smoking could mean smoking 1 pack a day for 30 years or 2 packs a day for 15 years). Yearly screening should continue until the smoker has stopped smoking for at least 15 years. Yearly screening should be stopped for people who develop a health problem that would prevent them from having lung cancer treatment.  If you choose to drink alcohol, do not have more than 2 drinks per day. One drink is considered to be 12 oz (360 mL) of beer, 5 oz (150 mL) of wine, or 1.5 oz (45 mL) of liquor.  Avoid use of street drugs.  Do not share needles with anyone. Ask for help if you need support or instructions about stopping the use of drugs.  High blood pressure causes heart disease and increases the risk of stroke. Blood pressure should be checked at least every 1 2 years. Ongoing high blood pressure should be treated with medicines if weight loss and exercise are not effective.  If you are 8045 61 years old, ask your health care provider if you should take aspirin to prevent heart disease.  Diabetes screening involves taking a blood sample to check your fasting  blood sugar level. This should be done once every 3 years after age 61, if you are at a normal weight and without risk factors for diabetes. Testing should be considered at a younger age or be carried out more frequently if you are overweight and have at least 1 risk factor for diabetes.  Colorectal cancer can be detected and often prevented. Most routine colorectal cancer screening begins at the age of 61 and continues through age 975. However, your health care provider may recommend screening at an earlier age if you have risk factors for colon cancer. On a yearly basis, your health care provider may provide home test kits to check for hidden blood in the stool. A small camera at the end of a tube may be used to directly examine the colon (sigmoidoscopy or colonoscopy) to detect the earliest forms of colorectal cancer. Talk to your health care provider about this at age 61, when routine screening begins. A direct exam of the colon should be repeated every 5 10 years through age 61, unless early forms of pre-cancerous polyps or small growths are found.  People who are at an increased risk for hepatitis B should be screened for this virus. You are considered at high risk for hepatitis B if:  You were born in a country where hepatitis B occurs often. Talk with your health care provider about which countries are considered high-risk.  Your parents were born in a high-risk country and you have not received a shot to protect against hepatitis B (hepatitis B vaccine).  You have HIV or AIDS.  You use needles to inject street drugs.  You live with, or have sex with, someone who has hepatitis B.  You are a man who has sex with other men (MSM).  You get hemodialysis treatment.  You take certain medicines for conditions like cancer, organ transplantation, and autoimmune conditions.  Hepatitis C blood testing is recommended for all people born from [birth year] through 1965 and any individual with known risk  factors for hepatitis C.  Healthy men should no longer receive prostate-specific antigen (PSA) blood tests as part of routine cancer screening. Talk to your health care provider about prostate cancer screening.  Testicular cancer screening is not recommended for adolescents or adult males who have no symptoms. Screening includes self-exam, a health care provider exam, and other screening tests. Consult with your health care provider about any symptoms you have or any concerns you have about testicular cancer.  Practice safe sex. Use condoms and avoid high-risk sexual practices to reduce the spread of sexually transmitted infections (STIs).  Use sunscreen. Apply sunscreen liberally and repeatedly throughout the day. You should seek shade when your shadow is shorter than you. Protect yourself by wearing long sleeves, pants, a wide-brimmed hat, and sunglasses year round, whenever you are outdoors.  Tell your health care provider of new moles or changes in moles, especially if there is a change  in shape or color. Also tell your provider if a mole is larger than the size of a pencil eraser.  A one-time screening for abdominal aortic aneurysm (AAA) and surgical repair of large AAAs by ultrasound is recommended for men aged 50 75 years who are current or former smokers.  Stay current with your vaccines (immunizations). Document Released: 12/30/2007 Document Revised: 04/23/2013 Document Reviewed: 11/28/2010 [facility] Patient Information 2014 [place], [place].

## 2013-12-18 NOTE — Progress Notes (Signed)
Pre visit review using our clinic review tool, if applicable. No additional management support is needed unless otherwise documented below in the visit note. 

## 2013-12-20 ENCOUNTER — Other Ambulatory Visit: Payer: Self-pay | Admitting: Family Medicine

## 2013-12-23 NOTE — Telephone Encounter (Signed)
Call in #30 with 5 rf 

## 2014-04-22 ENCOUNTER — Ambulatory Visit (INDEPENDENT_AMBULATORY_CARE_PROVIDER_SITE_OTHER): Payer: 59 | Admitting: Family Medicine

## 2014-04-22 ENCOUNTER — Encounter: Payer: Self-pay | Admitting: Family Medicine

## 2014-04-22 VITALS — BP 132/74 | HR 74 | Temp 97.8°F | Ht 70.5 in | Wt 245.0 lb

## 2014-04-22 DIAGNOSIS — G4700 Insomnia, unspecified: Secondary | ICD-10-CM

## 2014-04-22 DIAGNOSIS — Z23 Encounter for immunization: Secondary | ICD-10-CM

## 2014-04-22 MED ORDER — SUVOREXANT 10 MG PO TABS: 10.0000 mg | ORAL_TABLET | Freq: Every evening | ORAL | Status: DC | PRN

## 2014-04-22 NOTE — Progress Notes (Signed)
   Subjective:    Patient ID: [name], male    DOB: 29-Jun-1951, 63 y.o.   MRN: [id number]  HPI Here for follow up. He is doing well but he asks to try something different for sleep. He tried Ambien in the past but did not tolerate it due to bizarre dreams. He has tried Temazepam but it does not work very well.    Review of Systems  Constitutional: Negative.   Psychiatric/Behavioral: Positive for sleep disturbance. Negative for dysphoric mood. The patient is not nervous/anxious.        Objective:   Physical Exam  Constitutional: He is oriented to person, place, and time. He appears well-developed and well-nourished.  Neurological: He is alert and oriented to person, place, and time.  Psychiatric: He has a normal mood and affect. His behavior is normal. Thought content normal.          Assessment & Plan:  Try Belsomra 10 mg qhs.

## 2014-04-22 NOTE — Progress Notes (Signed)
Pre visit review using our clinic review tool, if applicable. No additional management support is needed unless otherwise documented below in the visit note. 

## 2014-04-28 ENCOUNTER — Encounter: Payer: Self-pay | Admitting: Family Medicine

## 2014-04-28 ENCOUNTER — Ambulatory Visit (INDEPENDENT_AMBULATORY_CARE_PROVIDER_SITE_OTHER): Payer: 59 | Admitting: Family Medicine

## 2014-04-28 VITALS — BP 137/70 | HR 62 | Ht 70.5 in | Wt 242.0 lb

## 2014-04-28 DIAGNOSIS — G4700 Insomnia, unspecified: Secondary | ICD-10-CM

## 2014-04-28 MED ORDER — SUVOREXANT 5 MG PO TABS: 5.0000 mg | ORAL_TABLET | Freq: Every evening | ORAL | Status: DC | PRN

## 2014-04-28 NOTE — Progress Notes (Signed)
Pre visit review using our clinic review tool, if applicable. No additional management support is needed unless otherwise documented below in the visit note. 

## 2014-04-28 NOTE — Progress Notes (Signed)
   Subjective:    Patient ID: [name], male    DOB: 11-07-1950, 64 y.o.   MRN: [id number]  HPI Here to discuss his sleep meds. Last week he stopped taking temazepam and tried Belsomra 10 mg for sleep. He struggled for the first 2 nights and it took about 2 hours for him to fall asleep. Then he felt groggy the next morning. He stopped this for 2 nights and then tried Belsomra again last night. He slept better but had trouble waking up this morning.    Review of Systems  Constitutional: Negative.   Neurological: Negative.   Psychiatric/Behavioral: Positive for sleep disturbance.       Objective:   Physical Exam  Constitutional: He is oriented to person, place, and time. He appears well-developed and well-nourished.  Neurological: He is alert and oriented to person, place, and time.  Psychiatric: He has a normal mood and affect. His behavior is normal. Thought content normal.          Assessment & Plan:  I think part of the difficulties he had the first 2 nights taking Belsomra was due to his body coming off the temazepam. He will continue to take Belsomra but we will reduce the dose to 5 mg. Recheck prn

## 2014-05-01 ENCOUNTER — Other Ambulatory Visit: Payer: Self-pay

## 2014-05-12 ENCOUNTER — Telehealth: Payer: Self-pay | Admitting: Internal Medicine

## 2014-05-12 NOTE — Telephone Encounter (Signed)
I have scheduled with the patient for 06/05/14 3:15

## 2014-05-12 NOTE — Telephone Encounter (Signed)
OK to set up as a banding

## 2014-05-12 NOTE — Telephone Encounter (Signed)
Patient requesting a hemorrhoid banding appt.  Ok to set up or does he need office visit?

## 2014-05-22 ENCOUNTER — Telehealth: Payer: Self-pay | Admitting: Family Medicine

## 2014-05-22 NOTE — Telephone Encounter (Signed)
Pt said he has been trying different dosage of Bellsomina and 15 mg works best

## 2014-05-25 NOTE — Telephone Encounter (Signed)
Call in Belsomra 15 mg qhs, #30 with 5 rf

## 2014-05-26 MED ORDER — SUVOREXANT 15 MG PO TABS: 15.0000 mg | ORAL_TABLET | Freq: Every evening | ORAL | Status: DC | PRN

## 2014-05-26 NOTE — Telephone Encounter (Signed)
Script printed, however I did call it in to pharmacy and spoke with pt.

## 2014-06-05 ENCOUNTER — Encounter: Payer: Self-pay | Admitting: Internal Medicine

## 2014-06-05 ENCOUNTER — Ambulatory Visit (INDEPENDENT_AMBULATORY_CARE_PROVIDER_SITE_OTHER): Payer: 59 | Admitting: Internal Medicine

## 2014-06-05 VITALS — BP 154/62 | HR 80 | Ht 70.5 in | Wt 243.1 lb

## 2014-06-05 DIAGNOSIS — K602 Anal fissure, unspecified: Secondary | ICD-10-CM | POA: Insufficient documentation

## 2014-06-05 DIAGNOSIS — L290 Pruritus ani: Secondary | ICD-10-CM

## 2014-06-05 DIAGNOSIS — K648 Other hemorrhoids: Secondary | ICD-10-CM

## 2014-06-05 MED ORDER — POLYETHYLENE GLYCOL 3350 17 GM/SCOOP PO POWD: 17.0000 g | Freq: Every day | ORAL | Status: DC | PRN

## 2014-06-05 MED ORDER — HYOSCYAMINE SULFATE 0.125 MG SL SUBL: 0.1250 mg | SUBLINGUAL_TABLET | SUBLINGUAL | Status: DC | PRN

## 2014-06-05 MED ORDER — DILTIAZEM GEL 2 %: 1.0000 "application " | Freq: Two times a day (BID) | CUTANEOUS | Status: DC

## 2014-06-05 MED ORDER — BENEFIBER PO POWD: ORAL | Status: DC

## 2014-06-05 NOTE — Assessment & Plan Note (Signed)
See if banding of hemorrhoids helps

## 2014-06-05 NOTE — Assessment & Plan Note (Addendum)
RP banded Benefiber 2 tbsp/day Miralax prn RTC Dec for repeat banding 1-2

## 2014-06-05 NOTE — Progress Notes (Signed)
Patient ID: [name], male   DOB: 1951-01-20, 63 y.o.   MRN: [id number]  The patient has internal hemorrhoids identified at prior colonoscopy. He has stopped using NSAIDS regularly after ileal ulcers found. Notices less rectal bleeding.  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 PROCEDURE NOTE: The patient presents with symptomatic grade 2 hemorrhoids, requesting rubber band ligation of his/her hemorrhoidal disease.  All risks, benefits and alternative forms of therapy were described and informed consent was obtained.   The anorectum was pre-medicated with 0.125% NTG and 5% lidocaine. The decision was made to band the RP internal hemorrhoid, and the [facility] O'Regan System was used to perform band ligation without complication.  Digital anorectal examination was then performed to assure proper positioning of the band, and to adjust the banded tissue as required.  The patient was discharged home without pain or other issues.  Dietary and behavioral recommendations were given and along with follow-up instructions.     The following adjunctive treatments were recommended:  Benefiber 2 tbsp/day Hyoscyamine 0.12555 mg SL prn to prevent urgent defecation Advised but not prescribed probiotic  The patient will return in 2-4 weeks for  follow-up and possible additional banding as required. No complications were encountered and the patient tolerated the procedure well.  . Hemorrhoids, internal, with bleeding RP banded Benefiber 2 tbsp/day Miralax prn RTC Dec for repeat banding 1-2  Pruritus ani See if banding of hemorrhoids helps  Anal fissure - anterior Diltiazem gel  Anal fissure handout

## 2014-06-05 NOTE — Patient Instructions (Addendum)
HEMORRHOID BANDING PROCEDURE    FOLLOW-UP CARE   1. The procedure you have had should have been relatively painless since the banding of the area involved does not have nerve endings and there is no pain sensation.  The rubber band cuts off the blood supply to the hemorrhoid and the band may fall off as soon as 48 hours after the banding (the band may occasionally be seen in the toilet bowl following a bowel movement). You may notice a temporary feeling of fullness in the rectum which should respond adequately to plain Tylenol or Motrin.  2. Following the banding, avoid strenuous exercise that evening and resume full activity the next day.  A sitz bath (soaking in a warm tub) or bidet is soothing, and can be useful for cleansing the area after bowel movements.     3. To avoid constipation, take two tablespoons of natural wheat bran, natural oat bran, flax, Benefiber or any over the counter fiber supplement and increase your water intake to 7-8 glasses daily.    4. Unless you have been prescribed anorectal medication, do not put anything inside your rectum for two weeks: No suppositories, enemas, fingers, etc.  5. Occasionally, you may have more bleeding than usual after the banding procedure.  This is often from the untreated hemorrhoids rather than the treated one.  Don't be concerned if there is a tablespoon or so of blood.  If there is more blood than this, lie flat with your bottom higher than your head and apply an ice pack to the area. If the bleeding does not stop within a half an hour or if you feel faint, call our office at [phone] or go to the emergency room.  6. Problems are not common; however, if there is a substantial amount of bleeding, severe pain, chills, fever or difficulty passing urine (very rare) or other problems, you should call us at [phone] or report to the nearest emergency room.  7. Do not stay seated continuously for more than 2-3 hours for a day or two  after the procedure.  Tighten your buttock muscles 10-15 times every two hours and take 10-15 deep breaths every 1-2 hours.  Do not spend more than a few minutes on the toilet if you cannot empty your bowel; instead re-visit the toilet at a later time.    Take 2 tablespoons of benefiber daily.  Handout provided.  Use your Miralax as needed.   We have sent the following medications to your pharmacy for you to pick up at your convenience: Generic Levsin to use for urgent defecation  Don't use your Cialis today per Dr Leone PayorGessner because we applied nitroglycerin to you today.   Today you have been given a handout to read on anal fissure.  Today we have given you a rx for diltiazem gel to take to the pharmacy to be filled.  We will see you at your next appointment:  December 9th at 2pm.   I appreciate the opportunity to care for you.

## 2014-06-05 NOTE — Assessment & Plan Note (Addendum)
Diltiazem gel  Anal fissure handout

## 2014-06-24 ENCOUNTER — Encounter: Payer: 59 | Admitting: Internal Medicine

## 2014-07-01 ENCOUNTER — Encounter: Payer: 59 | Admitting: Internal Medicine

## 2014-07-30 ENCOUNTER — Encounter: Payer: Self-pay | Admitting: Family Medicine

## 2014-07-30 ENCOUNTER — Ambulatory Visit (INDEPENDENT_AMBULATORY_CARE_PROVIDER_SITE_OTHER): Payer: 59 | Admitting: Family Medicine

## 2014-07-30 VITALS — BP 132/76 | HR 73 | Temp 97.8°F | Ht 70.5 in | Wt 244.0 lb

## 2014-07-30 DIAGNOSIS — F32A Depression, unspecified: Secondary | ICD-10-CM

## 2014-07-30 DIAGNOSIS — F329 Major depressive disorder, single episode, unspecified: Secondary | ICD-10-CM

## 2014-07-30 DIAGNOSIS — G4700 Insomnia, unspecified: Secondary | ICD-10-CM

## 2014-07-30 MED ORDER — VALACYCLOVIR HCL 500 MG PO TABS: 500.0000 mg | ORAL_TABLET | Freq: Two times a day (BID) | ORAL | Status: DC

## 2014-07-30 MED ORDER — BUPROPION HCL ER (XL) 150 MG PO TB24: 150.0000 mg | ORAL_TABLET | Freq: Every day | ORAL | Status: DC

## 2014-07-30 NOTE — Progress Notes (Signed)
   Subjective:    Patient ID: [name], male    DOB: 1951-05-08, 64 y.o.   MRN: [id number]  HPI Here to discuss options for his sleep medication. He is currently using Belsomra and this has been working quite well for him. He gets a good night's sleep and has no side effects. Of note he has tried Ambien and had bad side effects. He has tried Temazepam and this did not work well. His insurance company covered Belsomra last year but not this year, so now he cannot afford it.    Review of Systems  Constitutional: Negative.   Psychiatric/Behavioral: Positive for sleep disturbance and dysphoric mood. The patient is not nervous/anxious.        Objective:   Physical Exam  Constitutional: He is oriented to person, place, and time. He appears well-developed and well-nourished.  Neurological: He is alert and oriented to person, place, and time.  Psychiatric: He has a normal mood and affect. His behavior is normal. Thought content normal.          Assessment & Plan:  We will get back on Wellbutrin XL 150 mg daily per his request. We will initiate a prior authorization at his pharmacy.

## 2014-07-30 NOTE — Progress Notes (Signed)
Pre visit review using our clinic review tool, if applicable. No additional management support is needed unless otherwise documented below in the visit note. 

## 2014-07-31 ENCOUNTER — Ambulatory Visit: Payer: Self-pay | Admitting: Family Medicine

## 2014-08-04 ENCOUNTER — Telehealth: Payer: Self-pay | Admitting: Family Medicine

## 2014-08-04 NOTE — Telephone Encounter (Signed)
I called to submit a PA for Belsomra and was advised the medication is not on patient's formulary.  Was advised generic Lunesta 2 mg and zolpidem 10 mg are both covered for 15 per 25 days with out a PA needed.  Anything over the Quantity Limit of 15 will require a Quantity Limit PA.

## 2014-08-05 ENCOUNTER — Encounter: Payer: Self-pay | Admitting: Family Medicine

## 2014-08-05 NOTE — Telephone Encounter (Signed)
APPEAL has been faxed to [phone].

## 2014-08-05 NOTE — Telephone Encounter (Signed)
He has tried both. Lunesta dod not work, and Zolpidem caused side effects like memory loss

## 2014-08-10 ENCOUNTER — Encounter: Payer: Self-pay | Admitting: Internal Medicine

## 2014-08-10 ENCOUNTER — Ambulatory Visit (INDEPENDENT_AMBULATORY_CARE_PROVIDER_SITE_OTHER): Payer: 59 | Admitting: Internal Medicine

## 2014-08-10 ENCOUNTER — Telehealth: Payer: Self-pay | Admitting: Family Medicine

## 2014-08-10 VITALS — BP 128/68 | HR 72 | Ht 70.5 in | Wt 240.0 lb

## 2014-08-10 DIAGNOSIS — K648 Other hemorrhoids: Secondary | ICD-10-CM

## 2014-08-10 DIAGNOSIS — K602 Anal fissure, unspecified: Secondary | ICD-10-CM

## 2014-08-10 MED ORDER — DILTIAZEM GEL 2 %: 1.0000 "application " | Freq: Two times a day (BID) | CUTANEOUS | Status: DC

## 2014-08-10 NOTE — Telephone Encounter (Signed)
Pt called to say that that the following med is still being reviewed Suvorexant (BELSOMRA) 15 MG TABS. He said it probably will not be approved and said that Dr [name] can rx him something else.

## 2014-08-10 NOTE — Assessment & Plan Note (Signed)
Slightly improved LL banded today RTC next month for repeat banding

## 2014-08-10 NOTE — Progress Notes (Signed)
Patient ID: [name], male   DOB: Aug 04, 1950, 64 y.o.   MRN: [id number]       BP 128/68 mmHg  Pulse 72  Ht 5' 10.5" (1.791 m)  Wt 240 lb (108.863 kg)  BMI 33.94 kg/m2  Reports less pain with fissure. Using Benefiber daily, MiraLax prn. Has not used hyoscyamine for intermittent urgent defecation.  PROCEDURE NOTE: The patient presents with symptomatic grade 2  hemorrhoids, requesting rubber band ligation of his/her hemorrhoidal disease.  All risks, benefits and alternative forms of therapy were described and informed consent was obtained.  Small anterior pile noted, slightly tender rectal exam, no mass, moderate spasm. The anorectum was pre-medicated with 0.125% NTG and 5% lidocaine The decision was made to band the LL internal hemorrhoid, and the [place] was used to perform band ligation without complication.  Digital anorectal examination was then performed to assure proper positioning of the band, and to adjust the banded tissue as required.  The patient was discharged home without pain or other issues.  Dietary and behavioral recommendations were given and along with follow-up instructions.     The following adjunctive treatments were recommended:  Benefiber daily prn MiraLx  The patient will return in 2-4 weeks for  follow-up and possible additional banding as required. No complications were encountered and the patient tolerated the procedure well.

## 2014-08-10 NOTE — Assessment & Plan Note (Signed)
Improved Continue diltiazem gel

## 2014-08-10 NOTE — Patient Instructions (Signed)
HEMORRHOID BANDING PROCEDURE    FOLLOW-UP CARE   1. The procedure you have had should have been relatively painless since the banding of the area involved does not have nerve endings and there is no pain sensation.  The rubber band cuts off the blood supply to the hemorrhoid and the band may fall off as soon as 48 hours after the banding (the band may occasionally be seen in the toilet bowl following a bowel movement). You may notice a temporary feeling of fullness in the rectum which should respond adequately to plain Tylenol or Motrin.  2. Following the banding, avoid strenuous exercise that evening and resume full activity the next day.  A sitz bath (soaking in a warm tub) or bidet is soothing, and can be useful for cleansing the area after bowel movements.     3. To avoid constipation, take two tablespoons of natural wheat bran, natural oat bran, flax, Benefiber or any over the counter fiber supplement and increase your water intake to 7-8 glasses daily.    4. Unless you have been prescribed anorectal medication, do not put anything inside your rectum for two weeks: No suppositories, enemas, fingers, etc.  5. Occasionally, you may have more bleeding than usual after the banding procedure.  This is often from the untreated hemorrhoids rather than the treated one.  Don't be concerned if there is a tablespoon or so of blood.  If there is more blood than this, lie flat with your bottom higher than your head and apply an ice pack to the area. If the bleeding does not stop within a half an hour or if you feel faint, call our office at [phone] or go to the emergency room.  6. Problems are not common; however, if there is a substantial amount of bleeding, severe pain, chills, fever or difficulty passing urine (very rare) or other problems, you should call us at [phone] or report to the nearest emergency room.  7. Do not stay seated continuously for more than 2-3 hours for a day or two  after the procedure.  Tighten your buttock muscles 10-15 times every two hours and take 10-15 deep breaths every 1-2 hours.  Do not spend more than a few minutes on the toilet if you cannot empty your bowel; instead re-visit the toilet at a later time.    Today we are giving you a printed rx for the diltiazem gel.   Don't take your cialis today.  We will see you at your next banding appointment.  I appreciate the opportunity to care for you. Stan Head, M.D. , [facility]

## 2014-08-11 NOTE — Telephone Encounter (Signed)
Cancel Belsomra and call in Trazadone 100 mg each night, #30 with 2 rf. We can increase the dose if this does not help

## 2014-08-12 MED ORDER — TRAZODONE HCL 100 MG PO TABS: 100.0000 mg | ORAL_TABLET | Freq: Every day | ORAL | Status: DC

## 2014-08-12 NOTE — Addendum Note (Signed)
Addended by: Aniceto Boss A on: 08/12/2014 03:05 PM   Modules accepted: Orders, Medications

## 2014-08-12 NOTE — Telephone Encounter (Signed)
appt cancelled

## 2014-08-12 NOTE — Telephone Encounter (Signed)
I sent script e-scribe and spoke with pt. 

## 2014-08-12 NOTE — Telephone Encounter (Signed)
Pt is on schedule for 08/14/14, per his request can you cancel this appointment.

## 2014-08-13 ENCOUNTER — Telehealth: Payer: Self-pay | Admitting: Family Medicine

## 2014-08-13 NOTE — Telephone Encounter (Signed)
Pt call to ask if you will except his wife as a new patient.

## 2014-08-14 ENCOUNTER — Ambulatory Visit: Payer: 59 | Admitting: Family Medicine

## 2014-08-14 NOTE — Telephone Encounter (Signed)
Yes I can see her  

## 2014-08-14 NOTE — Telephone Encounter (Signed)
[employer] sent a letter denying Appeal sent for [place].

## 2014-08-14 NOTE — Telephone Encounter (Signed)
The script was changed and new one called in.

## 2014-08-19 NOTE — Telephone Encounter (Signed)
Pt scheduled  

## 2014-09-03 ENCOUNTER — Encounter: Payer: 59 | Admitting: Internal Medicine

## 2014-09-07 ENCOUNTER — Ambulatory Visit (INDEPENDENT_AMBULATORY_CARE_PROVIDER_SITE_OTHER): Payer: 59 | Admitting: Family Medicine

## 2014-09-07 ENCOUNTER — Other Ambulatory Visit: Payer: Self-pay | Admitting: Family Medicine

## 2014-09-07 ENCOUNTER — Encounter: Payer: Self-pay | Admitting: Family Medicine

## 2014-09-07 VITALS — BP 137/78 | HR 62 | Temp 97.6°F | Ht 70.5 in | Wt 235.0 lb

## 2014-09-07 DIAGNOSIS — L989 Disorder of the skin and subcutaneous tissue, unspecified: Secondary | ICD-10-CM

## 2014-09-07 NOTE — Progress Notes (Signed)
   Subjective:    Patient ID: [name], male    DOB: Nov 24, 1950, 64 y.o.   MRN: [id number]  HPI Here for one week of a tender lesion on the back that has gotten red and swollen.    Review of Systems  Constitutional: Negative.        Objective:   Physical Exam  Constitutional: He appears well-developed and well-nourished.  Skin:  There is a blackened small lesion on a stalk in the center of the back surrounded by a zone of erythema           Assessment & Plan:  This is most likely an inflamed seborrheic keratosis. The area was cleansed with Betadine and LA was achieved using 2% Xylocaine with epinephrine. An eliptical incision was made around the lesion down to the deeper skin layers and the lesion was removed. This was sent for a pathology evaluation. The wound was closed using 3 sutures of 4-0 Ethilon and it was dressed with Neosporin and a bandaid. Sutures are to be removed in 10 days

## 2014-09-07 NOTE — Progress Notes (Signed)
Pre visit review using our clinic review tool, if applicable. No additional management support is needed unless otherwise documented below in the visit note. 

## 2014-09-14 ENCOUNTER — Encounter: Payer: Self-pay | Admitting: Family Medicine

## 2014-09-14 ENCOUNTER — Ambulatory Visit (INDEPENDENT_AMBULATORY_CARE_PROVIDER_SITE_OTHER): Payer: 59 | Admitting: Family Medicine

## 2014-09-14 VITALS — BP 128/80 | HR 71 | Temp 97.8°F | Wt 235.0 lb

## 2014-09-14 DIAGNOSIS — L989 Disorder of the skin and subcutaneous tissue, unspecified: Secondary | ICD-10-CM

## 2014-09-14 NOTE — Progress Notes (Signed)
Pre visit review using our clinic review tool, if applicable. No additional management support is needed unless otherwise documented below in the visit note. 

## 2014-09-14 NOTE — Progress Notes (Signed)
   Subjective:    Patient ID: [name], male    DOB: Nov 02, 1950, 64 y.o.   MRN: [id number]  HPI Here to remove sutures after we removed a lesion from the back on 09-07-14. 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 He feels fine.    Review of Systems  Constitutional: Negative.        Objective:   Physical Exam  Constitutional: He appears well-developed and well-nourished.  Skin:  The site is healing well          Assessment & Plan:  All sutures were removed. Recheck prn

## 2014-10-29 ENCOUNTER — Emergency Department (HOSPITAL_COMMUNITY): Payer: 59

## 2014-10-29 ENCOUNTER — Encounter (HOSPITAL_COMMUNITY): Payer: Self-pay | Admitting: Emergency Medicine

## 2014-10-29 ENCOUNTER — Emergency Department (HOSPITAL_COMMUNITY)
Admission: EM | Admit: 2014-10-29 | Discharge: 2014-10-29 | Disposition: A | Discharge status: Home / Self Care | Payer: 59 | Attending: Emergency Medicine | Admitting: Emergency Medicine

## 2014-10-29 ENCOUNTER — Telehealth: Payer: Self-pay | Admitting: Family Medicine

## 2014-10-29 DIAGNOSIS — I471 Supraventricular tachycardia: Secondary | ICD-10-CM | POA: Diagnosis not present

## 2014-10-29 DIAGNOSIS — Z9889 Other specified postprocedural states: Secondary | ICD-10-CM | POA: Diagnosis not present

## 2014-10-29 DIAGNOSIS — R109 Unspecified abdominal pain: Secondary | ICD-10-CM

## 2014-10-29 DIAGNOSIS — Z9852 Vasectomy status: Secondary | ICD-10-CM | POA: Diagnosis not present

## 2014-10-29 DIAGNOSIS — Z79899 Other long term (current) drug therapy: Secondary | ICD-10-CM | POA: Diagnosis not present

## 2014-10-29 DIAGNOSIS — J45909 Unspecified asthma, uncomplicated: Secondary | ICD-10-CM | POA: Diagnosis not present

## 2014-10-29 DIAGNOSIS — Z8619 Personal history of other infectious and parasitic diseases: Secondary | ICD-10-CM | POA: Diagnosis not present

## 2014-10-29 DIAGNOSIS — Z872 Personal history of diseases of the skin and subcutaneous tissue: Secondary | ICD-10-CM | POA: Diagnosis not present

## 2014-10-29 DIAGNOSIS — Z8719 Personal history of other diseases of the digestive system: Secondary | ICD-10-CM | POA: Diagnosis not present

## 2014-10-29 DIAGNOSIS — N401 Enlarged prostate with lower urinary tract symptoms: Secondary | ICD-10-CM | POA: Insufficient documentation

## 2014-10-29 LAB — CBC WITH DIFFERENTIAL/PLATELET
Basophils Absolute: 0 10*3/uL (ref 0.0–0.1)
Basophils Relative: 0 % (ref 0–1)
Eosinophils Absolute: 0.2 10*3/uL (ref 0.0–0.7)
Eosinophils Relative: 2 % (ref 0–5)
HCT: 47.3 % (ref 39.0–52.0)
Hemoglobin: 15.4 g/dL (ref 13.0–17.0)
Lymphocytes Relative: 30 % (ref 12–46)
Lymphs Abs: 2.9 10*3/uL (ref 0.7–4.0)
MCH: 29.1 pg (ref 26.0–34.0)
MCHC: 32.6 g/dL (ref 30.0–36.0)
MCV: 89.4 fL (ref 78.0–100.0)
Monocytes Absolute: 0.9 10*3/uL (ref 0.1–1.0)
Monocytes Relative: 9 % (ref 3–12)
Neutro Abs: 5.6 10*3/uL (ref 1.7–7.7)
Neutrophils Relative %: 59 % (ref 43–77)
Platelets: 185 10*3/uL (ref 150–400)
RBC: 5.29 MIL/uL (ref 4.22–5.81)
RDW: 13.3 % (ref 11.5–15.5)
WBC: 9.6 10*3/uL (ref 4.0–10.5)

## 2014-10-29 LAB — COMPREHENSIVE METABOLIC PANEL
ALT: 24 U/L (ref 0–53)
ANION GAP: 5 (ref 5–15)
AST: 37 U/L (ref 0–37)
Albumin: 4.1 g/dL (ref 3.5–5.2)
Alkaline Phosphatase: 66 U/L (ref 39–117)
BUN: 14 mg/dL (ref 6–23)
CALCIUM: 8.5 mg/dL (ref 8.4–10.5)
CO2: 25 mmol/L (ref 19–32)
CREATININE: 1.1 mg/dL (ref 0.50–1.35)
Chloride: 108 mmol/L (ref 96–112)
GFR calc non Af Amer: 71 mL/min — ABNORMAL LOW (ref >90)
GFR, EST AFRICAN AMERICAN: 82 mL/min — AB (ref >90)
GLUCOSE: 76 mg/dL (ref 70–99)
Potassium: 3.8 mmol/L (ref 3.5–5.1)
Sodium: 138 mmol/L (ref 135–145)
Total Bilirubin: 0.6 mg/dL (ref 0.3–1.2)
Total Protein: 6.8 g/dL (ref 6.0–8.3)

## 2014-10-29 MED ORDER — SODIUM CHLORIDE 0.9 % IV SOLN
Freq: Once | INTRAVENOUS | Status: AC
  Administered 2014-10-29: 16:00:00 via INTRAVENOUS

## 2014-10-29 MED ORDER — IOHEXOL 300 MG/ML  SOLN
100.0000 mL | Freq: Once | INTRAMUSCULAR | Status: AC | PRN
  Administered 2014-10-29: 100 mL via INTRAVENOUS

## 2014-10-29 NOTE — ED Notes (Signed)
Pt c/o abdominal pain x 2 days at site of incisional hernia from 2014. Pt denies N/V, fever, diarrhea. Pt's last bowel movement was last night and "normal."  Pt denies injury, sts he was sitting at a desk when it started to hurt. Pt c/o tenderness to palpation. Pt sts that his abdomen appears "normal." Pt sts the pain feels like it is under the mesh that was implanted in 2014. Pt denies any issues with previous surgery or the recovery.

## 2014-10-29 NOTE — Telephone Encounter (Signed)
[facility] [facility] - Client [facility] Patient Name: [name] DOB: [birth year] Initial Comment Caller states he has been having abdominal pain in hernia repair site for 48 hours, Nurse Assessment Nurse: Markus Daft, RN, Sherre Poot Date/Time (Eastern Time): 10/29/2014 2:22:35 PM Confirm and document reason for call. If symptomatic, describe symptoms. ---Caller states he has been having abdominal pain in hernia repair site (2014) for 48 hours off/on, but more constant today. Rates pain 4/10. Has the patient traveled out of the country within the last 30 days? ---Not Applicable Does the patient require triage? ---Yes Related visit to physician within the last 2 weeks? ---No Does the PT have any chronic conditions? (i.e. diabetes, asthma, etc.) ---Yes List chronic conditions. ---h/o 2 abdominal hernia repairs 2006 and 2014; colon resection 2005; diverticulitis; incarcerated hernia and sepsis in 2013 with emergency surgery Guidelines Guideline Title Affirmed Question Affirmed Notes Hernia Hernia is painful or tender to touch Final Disposition User Go to ED Now Markus Daft, RN, Sherre Poot Comments [name] has contacted his surgeon who is aware of the pain. He will go on to [employer] .

## 2014-10-29 NOTE — ED Provider Notes (Signed)
CSN: [id number]     Arrival date & time 10/29/14  1457 History   First MD Initiated Contact with Patient 10/29/14 1510     Chief Complaint  Patient presents with  . Abdominal Pain     Patient is a 64 y.o. male presenting with abdominal pain. The history is provided by the patient. No language interpreter was used.  Abdominal Pain  [name] presents for evaluation of abdominal pain.  He reports 48 hours of central abdominal pain that is located at the site of his prior hernia repair in 2014.  The pain is described as discomfort and was initially waxing and waning in nature and today is constant.  He feels as if the hernia feels different today (less soft).  He denies any fevers, nausea, vomiting, constipation, diarrhea.  Sxs are moderate and worsening.    Past Medical History  Diagnosis Date  . Diverticulitis of colon   . Hepatitis 1975    unknown type   . Asthma   . GERD (gastroesophageal reflux disease)   . Hiatal hernia   . BPH (benign prostatic hypertrophy) with urinary obstruction   . Psoriasis     sees [name] at [facility].  . Ileus following gastrointestinal surgery 12/26/2011  . Dilated aortic root     noted on echo 08/2012  . SVT (supraventricular tachycardia)   . H/O: GI bleed   . EPIDIDYMITIS 02/15/2010    Qualifier: Diagnosis of  By: [name] MD, [name]   . Acute prostatitis 07/24/2007    Qualifier: Diagnosis of  By: [name] MD, [name]   . HERPES SIMPLEX INFECTION 10/14/2007    Qualifier: Diagnosis of  By: [name] MD, [name]   . Recurrent ventral incisional hernia 05/10/2012  . Ulcer 08/21/2013    ileal  . Hemorrhoids    Past Surgical History  Procedure Laterality Date  . Hemicolectomy      left side, at [facility]  . Ileostomy    . Colonoscopy    . Cystoscopy    . Bronchoscopy    . Tonsillectomy    . Vasectomy    . Laparotomy  12/19/2011    Procedure: EXPLORATORY LAPAROTOMY;  Surgeon: [name], MD;  Location: [facility];  Service: General;   Laterality: N/A;  . Bowel resection  12/19/2011    Procedure: SMALL BOWEL RESECTION;  Surgeon: [name], MD;  Location: [facility];  Service: General;  Laterality: N/A;  with anastamosis and insertion mesh  . Colon surgery  01/2004  . Hernia repair      [phone] incisional hernia  . Ileostomy closure    . Ventral hernia repair  07/31/2012    Procedure: HERNIA REPAIR VENTRAL ADULT;  Surgeon: [name], MD;  Location: [facility];  Service: General;  Laterality: N/A;  . Insertion of mesh  07/31/2012    Procedure: INSERTION OF MESH;  Surgeon: [name], MD;  Location: [facility];  Service: General;;  . Esophagogastroduodenoscopy    . Colonoscopy w/ biopsies  08/21/2013  . Hemorrhoid banding     Family History  Problem Relation Age of Onset  . Lung cancer Mother   . Leukemia Father   . Hypertension Father   . Heart disease Father   . Heart attack Father   . Prostate cancer Father   . Prostate cancer Paternal Uncle   . Colon cancer Neg Hx    History  Substance Use Topics  . Smoking status: Never Smoker   .  Smokeless tobacco: Never Used  . Alcohol Use: 0.0 oz/week    0 Standard drinks or equivalent per week     Comment: once a month or less    Review of Systems  Gastrointestinal: Positive for abdominal pain.  All other systems reviewed and are negative.     Allergies  Shellfish allergy  Home Medications   Prior to Admission medications   Medication Sig Start Date End Date Taking? Authorizing Provider  buPROPion (WELLBUTRIN XL) 150 MG 24 hr tablet Take 1 tablet (150 mg total) by mouth daily. 07/30/14  Yes [name], MD  halobetasol (ULTRAVATE) 0.05 % ointment Apply topically 2 (two) times daily.   Yes Historical Provider, MD  ibuprofen (ADVIL,MOTRIN) 200 MG tablet Take 200 mg by mouth every 6 (six) hours as needed for headache, mild pain or moderate pain.   Yes Historical Provider, MD  Menthol-Zinc Oxide (CALMOSEPTINE) 0.44-20.6 % OINT Apply 1 application  topically 3 (three) times daily as needed (anal itching/rash). 08/05/13  Yes Gatha Mayer, MD  Multiple Vitamin (MULTIVITAMIN WITH MINERALS) TABS Take 1 tablet by mouth daily.   Yes Historical Provider, MD  traZODone (DESYREL) 100 MG tablet Take 1 tablet (100 mg total) by mouth at bedtime. 08/12/14  Yes [name], MD  valACYclovir (VALTREX) 500 MG tablet Take 1 tablet (500 mg total) by mouth 2 (two) times daily. 07/30/14  Yes [name], MD  Wheat Dextrin (BENEFIBER) POWD 2 tablespoons daily Patient taking differently: Take 30 mLs by mouth daily.  06/05/14  Yes Gatha Mayer, MD  albuterol (PROVENTIL HFA;VENTOLIN HFA) 108 (90 BASE) MCG/ACT inhaler Inhale 2 puffs into the lungs every 4 (four) hours as needed for wheezing or shortness of breath. 12/01/13   [name], MD  diltiazem 2 % GEL Apply 1 application topically 2 (two) times daily. 08/10/14   Gatha Mayer, MD  hyoscyamine (LEVSIN SL) 0.125 MG SL tablet Place 1 tablet (0.125 mg total) under the tongue every 4 (four) hours as needed for cramping (urgent defecation). 06/05/14   Gatha Mayer, MD  polyethylene glycol powder [facility]) powder Take 17 g by mouth daily as needed. 06/05/14   Gatha Mayer, MD  Tadalafil (CIALIS) 2.5 MG TABS Take 1 tablet (2.5 mg total) by mouth daily as needed for erectile dysfunction. 12/18/13   Zenaida Niece, PA-C   BP 151/92 mmHg  Pulse 61  Temp(Src) 98.5 F (36.9 C) (Oral)  Resp 20  SpO2 99% Physical Exam  Constitutional: He is oriented to person, place, and time. He appears well-developed and well-nourished.  HENT:  Head: Normocephalic and atraumatic.  Cardiovascular: Normal rate and regular rhythm.   No murmur heard. Pulmonary/Chest: Effort normal and breath sounds normal. No respiratory distress.  Abdominal: Soft. Bowel sounds are normal.  Irregular central abdominal well healed scar with mild fullness on the right hemi abdomen.  Minimal abdominal tenderness, no guarding, no rebound.    Musculoskeletal: He exhibits no edema or tenderness.  Neurological: He is alert and oriented to person, place, and time.  Skin: Skin is warm and dry.  Psychiatric: He has a normal mood and affect. His behavior is normal.  Nursing note and vitals reviewed.   ED Course  Procedures (including critical care time) Labs Review Labs Reviewed  COMPREHENSIVE METABOLIC PANEL - Abnormal; Notable for the following:    GFR calc non Af Amer 71 (*)    GFR calc Af Amer 82 (*)    All other  components within normal limits  CBC WITH DIFFERENTIAL/PLATELET    Imaging Review Ct Abdomen Pelvis W Contrast  10/29/2014   CLINICAL DATA:  Abdominal pain 2 days at site of incisional hernia from 2014. History of diverticulitis with colectomy and ileostomy post reversal.  EXAM: CT ABDOMEN AND PELVIS WITH CONTRAST  TECHNIQUE: Multidetector CT imaging of the abdomen and pelvis was performed using the standard protocol following bolus administration of intravenous contrast.  CONTRAST:  145m OMNIPAQUE IOHEXOL 300 MG/ML  SOLN  COMPARISON:  12/19/2011 and 07/04/2005  FINDINGS: Lung bases demonstrate a 1 cm nodule over the posterior right lower lobe unchanged from 2006. Possible small hiatal hernia.  Abdominal images demonstrate a normal liver, spleen, gallbladder, pancreas and adrenal glands. Kidneys are normal in size without hydronephrosis or nephrolithiasis. Ureters are normal. Appendix is normal. Vascular structures are within normal. Surgical suture line is present over the colon in the anterior mid abdomen.  There has been interval repair of patient's multiple anterior abdominal wall hernias. There postsurgical changes over the midline anterior abdominal wall without hernia recurrence. Moderate diastases of the rectus abdominis muscles unchanged.  Pelvic images demonstrate the bladder, prostate and rectum to be within normal. Remainder the exam is unchanged.  IMPRESSION: No acute findings in the abdomen/pelvis.  Interval  repair of multiple abdominal wall hernias. No recurrent hernia.  Stable 1 cm nodule over the right lower lobe since 2006.   Electronically Signed   By: DMarin OlpM.D.   On: 10/29/2014 18:43     EKG Interpretation None      MDM   Final diagnoses:  Central abdominal pain    Patient with history of multiple abdominal surgeries and prior incarceration of hernia and hernia repair. Patient minimally tender on exam without acute abdominal findings. CT abdomen negative for acute hernia, bowel obstruction, acute infection. Offered patient reassurance with plan for outpatient surgery and GI follow-up. Return precautions were discussed.   EQuintella Reichert MD 10/29/14 [phone]

## 2014-10-29 NOTE — Discharge Instructions (Signed)

## 2015-01-04 ENCOUNTER — Other Ambulatory Visit: Payer: Self-pay | Admitting: Family Medicine

## 2015-01-30 ENCOUNTER — Other Ambulatory Visit: Payer: Self-pay | Admitting: Family Medicine

## 2015-05-22 ENCOUNTER — Other Ambulatory Visit: Payer: Self-pay | Admitting: Family Medicine

## 2015-08-21 ENCOUNTER — Other Ambulatory Visit: Payer: Self-pay | Admitting: Internal Medicine

## 2015-08-23 NOTE — Telephone Encounter (Signed)
Ok to refill x 2  

## 2015-08-23 NOTE — Telephone Encounter (Signed)
May I refill this Sir? 

## 2015-09-24 ENCOUNTER — Encounter: Payer: Self-pay | Admitting: Family Medicine

## 2015-09-24 ENCOUNTER — Ambulatory Visit (INDEPENDENT_AMBULATORY_CARE_PROVIDER_SITE_OTHER): Payer: Commercial Managed Care - HMO | Admitting: Family Medicine

## 2015-09-24 VITALS — BP 129/75 | HR 54 | Temp 97.4°F | Ht 70.5 in | Wt 223.0 lb

## 2015-09-24 DIAGNOSIS — J209 Acute bronchitis, unspecified: Secondary | ICD-10-CM | POA: Diagnosis not present

## 2015-09-24 MED ORDER — AZITHROMYCIN 250 MG PO TABS: ORAL_TABLET | ORAL | Status: DC

## 2015-09-24 NOTE — Progress Notes (Signed)
   Subjective:    Patient ID: [name], male    DOB: Nov 15, 1950, 65 y.o.   MRN: [id number]  HPI Here for 3 days of ST, chest tightness and coughing up green sputum. No fever.    Review of Systems  Constitutional: Negative.   HENT: Positive for congestion, postnasal drip and sore throat. Negative for sinus pressure.   Eyes: Negative.   Respiratory: Positive for cough. Negative for shortness of breath and wheezing.   Cardiovascular: Negative.        Objective:   Physical Exam  Constitutional: He appears well-developed and well-nourished.  HENT:  Right Ear: External ear normal.  Left Ear: External ear normal.  Nose: Nose normal.  Mouth/Throat: Oropharynx is clear and moist.  Eyes: Conjunctivae are normal.  Neck: No thyromegaly present.  Cardiovascular: Normal rate, regular rhythm, normal heart sounds and intact distal pulses.   Pulmonary/Chest: Effort normal. No respiratory distress. He has no wheezes. He has no rales.  Rhonchi   Lymphadenopathy:    He has no cervical adenopathy.          Assessment & Plan:  Bronchitis, treat with a Zpack

## 2015-09-24 NOTE — Progress Notes (Signed)
Pre visit review using our clinic review tool, if applicable. No additional management support is needed unless otherwise documented below in the visit note. 

## 2015-10-01 ENCOUNTER — Other Ambulatory Visit: Payer: Self-pay | Admitting: Internal Medicine

## 2015-10-01 NOTE — Telephone Encounter (Signed)
Refill x 3 

## 2015-10-01 NOTE — Telephone Encounter (Signed)
Ok to refill Sir? 

## 2015-11-26 ENCOUNTER — Telehealth: Payer: Self-pay | Admitting: Family Medicine

## 2015-11-26 MED ORDER — TRAZODONE HCL 100 MG PO TABS: 100.0000 mg | ORAL_TABLET | Freq: Every day | ORAL | Status: DC

## 2015-11-26 NOTE — Telephone Encounter (Signed)
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 Pt does not have enough  traZODone (DESYREL) 100 MG tablet   to get through until he returns home.  Pt spoke to [facility] in [place] last night and they are going to  contact our office and request a 10 day supply.  Pt wanted to give you a heads up.

## 2015-11-26 NOTE — Telephone Encounter (Signed)
Please call in Trazodone 100 mg qhs #10 to the [facility]

## 2015-11-26 NOTE — Telephone Encounter (Signed)
rx called in

## 2015-11-26 NOTE — Telephone Encounter (Signed)
[phone] [facility] [address] the pharmacist called and states he can be reached at above number.

## 2015-12-25 ENCOUNTER — Other Ambulatory Visit: Payer: Self-pay | Admitting: Family Medicine

## 2016-06-06 ENCOUNTER — Ambulatory Visit: Payer: Commercial Managed Care - HMO | Admitting: Family Medicine

## 2016-07-17 ENCOUNTER — Other Ambulatory Visit: Payer: Self-pay | Admitting: Internal Medicine

## 2016-07-18 NOTE — Telephone Encounter (Signed)
May I refill Sir, thank you. 

## 2016-07-18 NOTE — Telephone Encounter (Signed)
Refill x 3 

## 2016-08-23 ENCOUNTER — Ambulatory Visit (INDEPENDENT_AMBULATORY_CARE_PROVIDER_SITE_OTHER): Payer: Commercial Managed Care - HMO | Admitting: Family Medicine

## 2016-08-23 ENCOUNTER — Encounter: Payer: Self-pay | Admitting: Family Medicine

## 2016-08-23 VITALS — BP 167/99 | HR 64 | Temp 97.5°F | Ht 70.5 in | Wt 231.0 lb

## 2016-08-23 DIAGNOSIS — L409 Psoriasis, unspecified: Secondary | ICD-10-CM | POA: Insufficient documentation

## 2016-08-23 DIAGNOSIS — B9789 Other viral agents as the cause of diseases classified elsewhere: Secondary | ICD-10-CM

## 2016-08-23 DIAGNOSIS — J069 Acute upper respiratory infection, unspecified: Secondary | ICD-10-CM | POA: Diagnosis not present

## 2016-08-23 MED ORDER — HALOBETASOL PROPIONATE 0.05 % EX OINT: TOPICAL_OINTMENT | Freq: Two times a day (BID) | CUTANEOUS | 2 refills | Status: DC

## 2016-08-23 NOTE — Progress Notes (Signed)
   Subjective:    Patient ID: [name], male    DOB: 06/04/1951, 66 y.o.   MRN: [id number]  HPI Here for 2 issues. First he asks for refills on psoriasis medication. He has a patch in his beard that flares up from time to time. Also for the past 2 days he has had body aches, PND, and a ST. No fever or cough.    Review of Systems  Constitutional: Negative.   HENT: Positive for congestion, postnasal drip and sore throat. Negative for sinus pain and sinus pressure.   Eyes: Negative.   Respiratory: Positive for cough.   Skin: Positive for rash.       Objective:   Physical Exam  Constitutional: He appears well-developed and well-nourished. No distress.  HENT:  Right Ear: External ear normal.  Left Ear: External ear normal.  Nose: Nose normal.  Mouth/Throat: Oropharynx is clear and moist.  Eyes: Conjunctivae are normal.  Neck: No thyromegaly present.  Pulmonary/Chest: Effort normal and breath sounds normal.  Lymphadenopathy:    He has no cervical adenopathy.  Skin:  Small patch of red scaly skin on the lower lip and chin          Assessment & Plan:  He has a viral URI. Rest, drink fluids, use Mucinex prn. Treat the psoriasis with Halobetasol ointment.  Gershon Crane, MD

## 2016-08-23 NOTE — Progress Notes (Signed)
Pre visit review using our clinic review tool, if applicable. No additional management support is needed unless otherwise documented below in the visit note. 

## 2016-10-22 ENCOUNTER — Other Ambulatory Visit: Payer: Self-pay | Admitting: Family Medicine

## 2016-12-26 ENCOUNTER — Encounter: Payer: Self-pay | Admitting: Family Medicine

## 2016-12-26 ENCOUNTER — Ambulatory Visit (INDEPENDENT_AMBULATORY_CARE_PROVIDER_SITE_OTHER): Payer: Commercial Managed Care - HMO | Admitting: Family Medicine

## 2016-12-26 VITALS — BP 134/86 | HR 54 | Temp 97.6°F | Ht 70.5 in | Wt 226.0 lb

## 2016-12-26 DIAGNOSIS — J019 Acute sinusitis, unspecified: Secondary | ICD-10-CM | POA: Diagnosis not present

## 2016-12-26 MED ORDER — AZITHROMYCIN 250 MG PO TABS: ORAL_TABLET | ORAL | 0 refills | Status: DC

## 2016-12-26 NOTE — Progress Notes (Signed)
   Subjective:    Patient ID: [name], male    DOB: 12-03-1950, 66 y.o.   MRN: [id number]  HPI Here for 3 days of sinus pressure, PND, ST, and a dry cough. No fever. Using Ibuprofen.    Review of Systems  Constitutional: Negative.   HENT: Positive for congestion, postnasal drip, sinus pain, sinus pressure and sore throat.   Eyes: Negative.   Respiratory: Positive for cough.        Objective:   Physical Exam  Constitutional: He appears well-developed and well-nourished.  HENT:  Right Ear: External ear normal.  Left Ear: External ear normal.  Nose: Nose normal.  Mouth/Throat: Oropharynx is clear and moist.  Eyes: Conjunctivae are normal.  Neck: Neck supple. No thyromegaly present.  Tender shotty AC nodes   Pulmonary/Chest: Effort normal and breath sounds normal. No respiratory distress. He has no wheezes. He has no rales.          Assessment & Plan:  Sinusitis, treat with a Zpack.  Alysia Penna, MD

## 2016-12-26 NOTE — Patient Instructions (Signed)
WE NOW OFFER   [facility] FAST TRACK!!!  SAME DAY Appointments for ACUTE CARE  Such as: Sprains, Injuries, cuts, abrasions, rashes, muscle pain, joint pain, back pain Colds, flu, sore throats, headache, allergies, cough, fever  Ear pain, sinus and eye infections Abdominal pain, nausea, vomiting, diarrhea, upset stomach Animal/insect bites  3 Easy Ways to Schedule: Walk-In Scheduling Call in scheduling Mychart Sign-up: [email or web address]         

## 2016-12-28 ENCOUNTER — Telehealth: Payer: Self-pay | Admitting: Family Medicine

## 2016-12-28 NOTE — Telephone Encounter (Signed)
Patient is calling in stating he was seen just this Tuesday for sinuses and it has now settled in his chest.  Asking if he can get and Rx for an Expectorant to help him cough it up.  He states there was something that was prescribed before "Glosson"?was prescribed before and worked.  He also states he has an extensive hernia repair so just wants to get the stuff out of his chest before it's too bad.

## 2016-12-28 NOTE — Telephone Encounter (Signed)
This was Mucinex (or guaifenesin) which is OTC. He should take 1200 mg of this BID

## 2016-12-29 NOTE — Telephone Encounter (Signed)
I spoke with pt and went over advice.  

## 2017-01-04 ENCOUNTER — Ambulatory Visit (INDEPENDENT_AMBULATORY_CARE_PROVIDER_SITE_OTHER): Payer: Commercial Managed Care - HMO | Admitting: Family Medicine

## 2017-01-04 ENCOUNTER — Encounter: Payer: Self-pay | Admitting: Family Medicine

## 2017-01-04 VITALS — BP 146/86 | HR 60 | Temp 97.7°F | Ht 70.5 in | Wt 223.0 lb

## 2017-01-04 DIAGNOSIS — J209 Acute bronchitis, unspecified: Secondary | ICD-10-CM | POA: Diagnosis not present

## 2017-01-04 MED ORDER — LEVOFLOXACIN 500 MG PO TABS: 500.0000 mg | ORAL_TABLET | Freq: Every day | ORAL | 0 refills | Status: AC

## 2017-01-04 NOTE — Progress Notes (Signed)
   Subjective:    Patient ID: [name], male    DOB: 06-08-1951, 66 y.o.   MRN: [id number]  HPI Here for evolving URI symptoms. He was here on 12-26-16 for a sinus infection and he was given a Zpack. Over the next few days his sinuses cleared but he developed chest congestion and now he is coughing up green sputum. Using Mucinex and Sudafed.    Review of Systems  Constitutional: Negative.   HENT: Negative.   Eyes: Negative.   Respiratory: Positive for cough and chest tightness. Negative for shortness of breath and wheezing.   Cardiovascular: Negative.        Objective:   Physical Exam  Constitutional: He appears well-developed and well-nourished.  HENT:  Right Ear: External ear normal.  Left Ear: External ear normal.  Nose: Nose normal.  Mouth/Throat: Oropharynx is clear and moist.  Eyes: Conjunctivae are normal.  Neck: No thyromegaly present.  Pulmonary/Chest: Effort normal. No respiratory distress. He has no wheezes. He has no rales.  Scattered rhonchi   Lymphadenopathy:    He has no cervical adenopathy.          Assessment & Plan:  Bronchitis, treat with Levaquin.  Alysia Penna, MD

## 2017-01-04 NOTE — Patient Instructions (Signed)
WE NOW OFFER   [facility] FAST TRACK!!!  SAME DAY Appointments for ACUTE CARE  Such as: Sprains, Injuries, cuts, abrasions, rashes, muscle pain, joint pain, back pain Colds, flu, sore throats, headache, allergies, cough, fever  Ear pain, sinus and eye infections Abdominal pain, nausea, vomiting, diarrhea, upset stomach Animal/insect bites  3 Easy Ways to Schedule: Walk-In Scheduling Call in scheduling Mychart Sign-up: [email or web address]         

## 2017-01-30 ENCOUNTER — Ambulatory Visit (INDEPENDENT_AMBULATORY_CARE_PROVIDER_SITE_OTHER): Payer: Commercial Managed Care - HMO | Admitting: Internal Medicine

## 2017-01-30 ENCOUNTER — Encounter: Payer: Self-pay | Admitting: Internal Medicine

## 2017-01-30 ENCOUNTER — Other Ambulatory Visit (INDEPENDENT_AMBULATORY_CARE_PROVIDER_SITE_OTHER): Payer: Commercial Managed Care - HMO

## 2017-01-30 VITALS — BP 106/74 | HR 65 | Ht 70.0 in | Wt 222.0 lb

## 2017-01-30 DIAGNOSIS — K633 Ulcer of intestine: Secondary | ICD-10-CM | POA: Diagnosis not present

## 2017-01-30 DIAGNOSIS — K580 Irritable bowel syndrome with diarrhea: Secondary | ICD-10-CM | POA: Diagnosis not present

## 2017-01-30 LAB — SEDIMENTATION RATE: Sed Rate: 11 mm/hr (ref 0–20)

## 2017-01-30 LAB — C-REACTIVE PROTEIN: CRP: 0.7 mg/dL (ref 0.5–20.0)

## 2017-01-30 MED ORDER — RIFAXIMIN 550 MG PO TABS: 550.0000 mg | ORAL_TABLET | Freq: Three times a day (TID) | ORAL | 0 refills | Status: DC

## 2017-01-30 NOTE — Progress Notes (Signed)
NAS WAFER 66 y.o. 05-27-1951 [id number]  Assessment & Plan:   Encounter Diagnoses  Name Primary?  . Irritable bowel syndrome with diarrhea Yes  . Ulceration of terminal ileum 2015 ? NSAID     Its possible the IBS-D is being caused by some bacterial overgrowth and we have sent in a Rx for Xifaxan for him to try.  He should call us in a couple weeks after taking the Xifaxan to let us know how he is doing and if his symptoms are better or worse.  Recommend a low-fiber diet because foods high in fiber are flaring his bowels up more and causing more urgency and frequency of BMs.  Hopefully a low-fiber diet will decrease cramping and urgency.  Labs: Sed Rate, CRP ordered to check inflammation and if elevated would make more suspicious of  Crohn's Disease or other inflammatory process.  At this point it is somewhat unclear why Mr. Follette is having these issues but we will start with ABX treatment and see if he responds to treatment.  Other possible treatment may include Budesonide  depending on lab results.  No colonoscopy is necessary at this time- he is due for screening colonoscopy in 2025.  I have personally seen the patient, reviewed and repeated key elements of the history and physical and participated in formation of the assessment and plan the student has documented.  Iva Boop, MD, [facility]    Subjective:   Chief Complaint: "Lavenia Atlas been having much more frequent diarrhea"  HPI Mr. Gonnella is a pleasant gentleman well know to the practice with prior abdominal surgeries including exploratory laparotomy 2013 and ventral hernia repair in 2014, and colonoscopy performed in 2015 where some ulcers were found- possibly from excessive NSAID use.  He has a long history of intermittent diarrhea since 2013, but has had recent new onset of increasing diarrhea the past 2 months.  He describes it as cramping pain followed by multiple watery diarrheal bowel movents with some undigested  food- often roughage. He says that now he has to plan his day around his symptoms and is less likely to go out to dinner because of needing to have a BM multiple times after eating and especially in the evenings they seem to happen more often.  His sleep is not disturbed with urgency, and he denies any blood in his BMs.  He takes hyoscyamine and that seems to help with symptoms control temporarily, but is not treating the source of his diarrhea.   He denies weight loss, or weight gain.  Denies fever, chills or night sweats.   No nausea or vomiting.  Allergies  Allergen Reactions  . Shellfish Allergy     Throat stated to close   Current Meds  Medication Sig  . albuterol (PROVENTIL HFA;VENTOLIN HFA) 108 (90 BASE) MCG/ACT inhaler Inhale 2 puffs into the lungs every 4 (four) hours as needed for wheezing or shortness of breath.  . halobetasol (ULTRAVATE) 0.05 % ointment Apply topically 2 (two) times daily.  . hyoscyamine (LEVSIN SL) 0.125 MG SL tablet PLACE 1 TABLET UNDER THE TONGUE EVERY 4 HOURS AS NEEDED FOR CRAMPING (URGENT DEFECATION)  . Multiple Vitamin (MULTIVITAMIN WITH MINERALS) TABS Take 1 tablet by mouth daily.  . traZODone (DESYREL) 100 MG tablet Take 1 tablet (100 mg total) by mouth at bedtime.  . valACYclovir (VALTREX) 500 MG tablet Take 1 tablet (500 mg total) by mouth 2 (two) times daily. (Patient taking differently: Take 500 mg by mouth as needed. )  Past Medical History:  Diagnosis Date  . Acute prostatitis 07/24/2007   Qualifier: Diagnosis of  By: Clent Ridges MD, Tera Mater   . Asthma   . BPH (benign prostatic hypertrophy) with urinary obstruction   . Dilated aortic root (HCC)    noted on echo 08/2012  . Diverticulitis of colon   . EPIDIDYMITIS 02/15/2010   Qualifier: Diagnosis of  By: Clent Ridges MD, Tera Mater   . GERD (gastroesophageal reflux disease)   . H/O: GI bleed   . Hemorrhoids   . Hepatitis 1975   unknown type   . HERPES SIMPLEX INFECTION 10/14/2007   Qualifier: Diagnosis of  By:  Clent Ridges MD, Tera Mater   . Hiatal hernia   . Ileus following gastrointestinal surgery 12/26/2011  . Psoriasis    sees Dr. Karene Fry at [facility].  . Recurrent ventral incisional hernia 05/10/2012  . SVT (supraventricular tachycardia) (HCC)   . Ulcer 08/21/2013   ileal   Past Surgical History:  Procedure Laterality Date  . BOWEL RESECTION  12/19/2011   Procedure: SMALL BOWEL RESECTION;  Surgeon: Mariella Saa, MD;  Location: [facility];  Service: General;  Laterality: N/A;  with anastamosis and insertion mesh  . BRONCHOSCOPY    . COLON SURGERY  01/2004  . COLONOSCOPY    . COLONOSCOPY W/ BIOPSIES  08/21/2013  . CYSTOSCOPY    . ESOPHAGOGASTRODUODENOSCOPY    . HEMICOLECTOMY     left side, at [facility]  . HEMORRHOID BANDING    . HERNIA REPAIR     [phone] incisional hernia  . ILEOSTOMY    . ILEOSTOMY CLOSURE    . INSERTION OF MESH  07/31/2012   Procedure: INSERTION OF MESH;  Surgeon: Mariella Saa, MD;  Location: [facility];  Service: General;;  . LAPAROTOMY  12/19/2011   Procedure: EXPLORATORY LAPAROTOMY;  Surgeon: Mariella Saa, MD;  Location: [facility];  Service: General;  Laterality: N/A;  . TONSILLECTOMY    . VASECTOMY    . VENTRAL HERNIA REPAIR  07/31/2012   Procedure: HERNIA REPAIR VENTRAL ADULT;  Surgeon: Mariella Saa, MD;  Location: [facility];  Service: General;  Laterality: N/A;   Social History   Social History  . Marital status: Married    Spouse name: N/A  . Number of children: 2  . Years of education: N/A   Occupational History  . 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 Smoking status: Never Smoker  . Smokeless tobacco: Never Used  . Alcohol use 0.0 oz/week     Comment: once a month or less  . Drug use: No  . Sexual activity: Yes    Partners: Female   Other Topics Concern  . 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   Review of Systems  GI: positive for diarrhea and abdominal pain. Negative for Nausea, vomiting, hematochezia. See HPI for ROS All other systems negative  Objective:   Physical Exam @BP  106/74   Pulse 65   Ht 5\' 10"  (1.778 m)   Wt 222 lb (100.7 kg)   BMI 31.85 kg/m @  General:  Well-developed, well-nourished and in no acute distress Eyes:  anicteric. ENT:   Mouth and posterior pharynx free of lesions.  Lungs: Clear to auscultation bilaterally. Heart:   S1S2, no rubs, murmurs, gallops. Abdomen:  soft, non-tender, no hepatosplenomegaly, hernia, or mass and BS+. Well healed vertical mid-line   incision. Rectal: Not examined Lymph:  no cervical or supraclavicular adenopathy. Extremities:   no edema, cyanosis or clubbing Skin   no rash. Neuro:  A&O x 3.  Psych:  appropriate mood and  Affect.   Data Reviewed: Colonoscopy

## 2017-01-30 NOTE — Patient Instructions (Signed)
  Today we are giving you a low fiber diet to read and follow.   Your physician has requested that you go to the basement for lab work before leaving today.   We have sent the following medications to your pharmacy for you to pick up at your convenience: Xifaxan, if this is too expensive don't pick it up.   Call us with an update on how your doing several weeks after the round of xifaxan.   I appreciate the opportunity to care for you. Stan Head, MD, [facility]

## 2017-01-31 NOTE — Progress Notes (Signed)
My chart note

## 2017-02-05 ENCOUNTER — Other Ambulatory Visit: Payer: Commercial Managed Care - HMO

## 2017-02-05 ENCOUNTER — Other Ambulatory Visit: Payer: Self-pay | Admitting: Internal Medicine

## 2017-02-05 ENCOUNTER — Telehealth: Payer: Self-pay | Admitting: Family Medicine

## 2017-02-05 DIAGNOSIS — K580 Irritable bowel syndrome with diarrhea: Secondary | ICD-10-CM

## 2017-02-05 NOTE — Telephone Encounter (Signed)
Pt dropped off stool sample at [facility] lab this morning. We need to change fecal leukocytes order to a future lab order.

## 2017-02-05 NOTE — Telephone Encounter (Signed)
This was ordered by [name] in GI, so they need to ask him for a diagnosis

## 2017-02-05 NOTE — Telephone Encounter (Signed)
I spoke with [facility] lab, they will notify Dr. Leone Payor to change order in computer

## 2017-02-06 LAB — FECAL LACTOFERRIN, QUANT: LACTOFERRIN: POSITIVE

## 2017-02-07 NOTE — Progress Notes (Signed)
+   fecal lactoferrin Will message patient

## 2017-02-15 NOTE — Progress Notes (Signed)
No response to My Chart message   Please ask how he is doing re: diarrhea - is on Xifaxan

## 2017-02-26 ENCOUNTER — Encounter: Payer: Self-pay | Admitting: Internal Medicine

## 2017-02-27 ENCOUNTER — Encounter: Payer: Self-pay | Admitting: Family Medicine

## 2017-02-27 ENCOUNTER — Ambulatory Visit (INDEPENDENT_AMBULATORY_CARE_PROVIDER_SITE_OTHER): Payer: Commercial Managed Care - HMO | Admitting: Family Medicine

## 2017-02-27 VITALS — BP 125/78 | HR 66 | Temp 97.9°F | Ht 70.5 in | Wt 225.0 lb

## 2017-02-27 DIAGNOSIS — N410 Acute prostatitis: Secondary | ICD-10-CM

## 2017-02-27 LAB — POC URINALSYSI DIPSTICK (AUTOMATED)
Bilirubin, UA: NEGATIVE
Blood, UA: NEGATIVE
Glucose, UA: NEGATIVE
KETONES UA: NEGATIVE
LEUKOCYTES UA: NEGATIVE
Nitrite, UA: NEGATIVE
Spec Grav, UA: >=1.03 — AB (ref 1.010–1.025)
Urobilinogen, UA: NEGATIVE E.U./dL — AB
pH, UA: 5.5 (ref 5.0–8.0)

## 2017-02-27 MED ORDER — CIPROFLOXACIN HCL 500 MG PO TABS: 500.0000 mg | ORAL_TABLET | Freq: Two times a day (BID) | ORAL | 0 refills | Status: DC

## 2017-02-27 NOTE — Addendum Note (Signed)
Addended by: Aggie Hacker A on: 02/27/2017 02:41 PM   Modules accepted: Orders

## 2017-02-27 NOTE — Patient Instructions (Signed)
WE NOW OFFER   [facility] FAST TRACK!!!  SAME DAY Appointments for ACUTE CARE  Such as: Sprains, Injuries, cuts, abrasions, rashes, muscle pain, joint pain, back pain Colds, flu, sore throats, headache, allergies, cough, fever  Ear pain, sinus and eye infections Abdominal pain, nausea, vomiting, diarrhea, upset stomach Animal/insect bites  3 Easy Ways to Schedule: Walk-In Scheduling Call in scheduling Mychart Sign-up: [email or web address]         

## 2017-02-27 NOTE — Progress Notes (Signed)
   Subjective:    Patient ID: [name], male    DOB: 04-13-1951, 66 y.o.   MRN: [id number]  HPI Here for a possible prostate infection. He has had several of these in the past but not for 3 years or so. For the past 3 days he has swelling and pain in the rectal area, urgency to urinate and a very slow stream. No fever. He drinks plenty of water.    Review of Systems  Constitutional: Negative.   Respiratory: Negative.   Cardiovascular: Negative.   Gastrointestinal: Positive for rectal pain. Negative for abdominal distention, abdominal pain, anal bleeding, blood in stool, constipation, diarrhea, nausea and vomiting.  Genitourinary: Positive for difficulty urinating, frequency and urgency. Negative for dysuria, flank pain and hematuria.       Objective:   Physical Exam  Constitutional: He is oriented to person, place, and time. He appears well-developed and well-nourished.  Cardiovascular: Normal rate, regular rhythm, normal heart sounds and intact distal pulses.   Pulmonary/Chest: Effort normal and breath sounds normal.  Abdominal: Soft. Bowel sounds are normal. He exhibits no distension and no mass. There is no tenderness. There is no rebound and no guarding.  Genitourinary:  Genitourinary Comments: Prostate is swollen and tender   Neurological: He is alert and oriented to person, place, and time.          Assessment & Plan:  Prostatitis, treat with Cipro for 14 days.  Alysia Penna, MD

## 2017-03-05 ENCOUNTER — Telehealth: Payer: Self-pay

## 2017-03-05 NOTE — Telephone Encounter (Signed)
Will forward to a patient care coordinator to set up appointments.

## 2017-03-05 NOTE — Telephone Encounter (Signed)
-----   Message from Iva Boop, MD sent at 03/02/2017  5:42 PM EDT ----- Regarding: please schedule colonoscopy Dx is chronic diarrhea  direct

## 2017-03-06 ENCOUNTER — Encounter: Payer: Self-pay | Admitting: Internal Medicine

## 2017-03-06 NOTE — Telephone Encounter (Signed)
Patient is scheduled for colon on 04-26-17 and previsit on 04-11-17.  Letter mailed

## 2017-04-05 ENCOUNTER — Encounter: Payer: Self-pay | Admitting: Family Medicine

## 2017-04-11 ENCOUNTER — Telehealth: Payer: Self-pay | Admitting: *Deleted

## 2017-04-11 NOTE — Telephone Encounter (Signed)
Patient no show pv today. Called patient left message for him to call us back tomorrow to reschedule this nurse visit. No show letter was also mailed.

## 2017-04-17 ENCOUNTER — Ambulatory Visit (AMBULATORY_SURGERY_CENTER): Payer: Self-pay | Admitting: *Deleted

## 2017-04-17 VITALS — Ht 70.0 in | Wt 221.4 lb

## 2017-04-17 DIAGNOSIS — K529 Noninfective gastroenteritis and colitis, unspecified: Secondary | ICD-10-CM

## 2017-04-17 NOTE — Progress Notes (Signed)
No egg or soy allergy known to patient  No issues with past sedation with any surgeries  or procedures, no intubation problems  No diet pills per patient No home 02 use per patient  No blood thinners per patient  Pt denies issues with constipation  No A fib or A flutter  EMMI video sent to pt's e mail pt declined   

## 2017-04-19 ENCOUNTER — Encounter: Payer: Self-pay | Admitting: Internal Medicine

## 2017-04-26 ENCOUNTER — Ambulatory Visit (AMBULATORY_SURGERY_CENTER): Payer: 59 | Admitting: Internal Medicine

## 2017-04-26 ENCOUNTER — Encounter: Payer: Self-pay | Admitting: Internal Medicine

## 2017-04-26 VITALS — BP 121/60 | HR 59 | Temp 97.8°F | Resp 16 | Ht 70.0 in | Wt 221.0 lb

## 2017-04-26 DIAGNOSIS — K529 Noninfective gastroenteritis and colitis, unspecified: Secondary | ICD-10-CM | POA: Diagnosis present

## 2017-04-26 DIAGNOSIS — K633 Ulcer of intestine: Secondary | ICD-10-CM | POA: Diagnosis not present

## 2017-04-26 HISTORY — PX: COLONOSCOPY W/ BIOPSIES: SHX1374

## 2017-04-26 MED ORDER — SODIUM CHLORIDE 0.9 % IV SOLN
500.0000 mL | INTRAVENOUS | Status: DC
Start: 2017-04-26 — End: 2017-06-28

## 2017-04-26 NOTE — Patient Instructions (Addendum)
There were tiny ulcers in the small intestine - this can mean you have Crohn's disease which is a cause of diarrhea.  All else ok.  I will let you know results and plans next week.  I appreciate the opportunity to care for you. Justin Mayer, MD, [facility]  You may use Imodium up to 6 times per day if needed until your pathology results.    YOU HAD AN ENDOSCOPIC PROCEDURE TODAY AT [facility]:   Refer to the procedure report that was given to you for any specific questions about what was found during the examination.  If the procedure report does not answer your questions, please call your gastroenterologist to clarify.  If you requested that your care partner not be given the details of your procedure findings, then the procedure report has been included in a sealed envelope for you to review at your convenience later.  YOU SHOULD EXPECT: Some feelings of bloating in the abdomen. Passage of more gas than usual.  Walking can help get rid of the air that was put into your GI tract during the procedure and reduce the bloating. If you had a lower endoscopy (such as a colonoscopy or flexible sigmoidoscopy) you may notice spotting of blood in your stool or on the toilet paper. If you underwent a bowel prep for your procedure, you may not have a normal bowel movement for a few days.  Please Note:  You might notice some irritation and congestion in your nose or some drainage.  This is from the oxygen used during your procedure.  There is no need for concern and it should clear up in a day or so.  SYMPTOMS TO REPORT IMMEDIATELY:   Following lower endoscopy (colonoscopy or flexible sigmoidoscopy):  Excessive amounts of blood in the stool  Significant tenderness or worsening of abdominal pains  Swelling of the abdomen that is new, acute  Fever of 100F or higher   For urgent or emergent issues, a gastroenterologist can be reached at any hour by calling [phone]  [phone].   DIET:  We do recommend a small meal at first, but then you may proceed to your regular diet.  Drink plenty of fluids but you should avoid alcoholic beverages for 24 hours.  ACTIVITY:  You should plan to take it easy for the rest of today and you should NOT DRIVE or use heavy machinery until tomorrow (because of the sedation medicines used during the test).    FOLLOW UP: Our staff will call the number listed on your records the next business day following your procedure to check on you and address any questions or concerns that you may have regarding the information given to you following your procedure. If we do not reach you, we will leave a message.  However, if you are feeling well and you are not experiencing any problems, there is no need to return our call.  We will assume that you have returned to your regular daily activities without incident.  If any biopsies were taken you will be contacted by phone or by letter within the next 1-3 weeks.  Please call us at [phone] if you have not heard about the biopsies in 3 weeks.    SIGNATURES/CONFIDENTIALITY: You and/or your care partner have signed paperwork which will be entered into your electronic medical record.  These signatures attest to the fact that that the information above on your After Visit Summary has been reviewed and is understood.  Full responsibility of the confidentiality of this discharge information lies with you and/or your care-partner.

## 2017-04-26 NOTE — Progress Notes (Signed)
Called to room to assist during endoscopic procedure.  Patient ID and intended procedure confirmed with present staff. Received instructions for my participation in the procedure from the performing physician.  

## 2017-04-26 NOTE — Progress Notes (Signed)
A and O x3. Report to RN. Tolerated MAC anesthesia well.

## 2017-04-26 NOTE — Op Note (Signed)
[name] Procedure Date: 04/26/2017 7:56 AM MRN: [id number] Endoscopist: Iva Boop , MD Age: 66 Referring MD:  Date of Birth: [birth year] Gender: Male Account #: [id number] Procedure:                Colonoscopy Indications:              Chronic diarrhea Medicines:                Propofol per Anesthesia, Monitored Anesthesia Care Procedure:                Pre-Anesthesia Assessment:                           - Prior to the procedure, a History and Physical                            was performed, and patient medications and                            allergies were reviewed. The patient's tolerance of                            previous anesthesia was also reviewed. The risks                            and benefits of the procedure and the sedation                            options and risks were discussed with the patient.                            All questions were answered, and informed consent                            was obtained. Prior Anticoagulants: The patient has                            taken no previous anticoagulant or antiplatelet                            agents. ASA Grade Assessment: II - A patient with                            mild systemic disease. After reviewing the risks                            and benefits, the patient was deemed in                            satisfactory condition to undergo the procedure.                           After obtaining informed consent, the colonoscope  was passed under direct vision. Throughout the                            procedure, the patient's blood pressure, pulse, and                            oxygen saturations were monitored continuously. The                            Colonoscope was introduced through the anus and                            advanced to the the terminal ileum, with                            identification of the  appendiceal orifice and IC                            valve. The colonoscopy was performed without                            difficulty. The patient tolerated the procedure                            well. The quality of the bowel preparation was                            adequate. The terminal ileum, ileocecal valve,                            appendiceal orifice, and rectum were photographed.                            The bowel preparation used was Miralax. Scope In: 8:11:16 AM Scope Out: 8:19:44 AM Scope Withdrawal Time: 0 hours 8 minutes 2 seconds  Total Procedure Duration: 0 hours 8 minutes 28 seconds  Findings:                 The perianal and digital rectal examinations were                            normal. Pertinent negatives include normal prostate                            (size, shape, and consistency).                           Patchy inflammation characterized by serpentine                            ulcerations and shallow ulcerations was found in                            the terminal ileum. Biopsies were taken with a cold  forceps for histology. Verification of patient                            identification for the specimen was done. Estimated                            blood loss was minimal.                           Scattered diverticula were found in the right colon.                           The exam was otherwise without abnormality on                            direct and retroflexion views.                           Biopsies for histology were taken with a cold                            forceps from the right colon and left colon for                            evaluation of microscopic colitis. Complications:            No immediate complications. Estimated Blood Loss:     Estimated blood loss was minimal. Impression:               - Ileitis. Biopsied.                           - Diverticulosis in the right colon.                            - The examination was otherwise normal on direct                            and retroflexion views.                           - Biopsies were taken with a cold forceps from the                            right colon and left colon for evaluation of                            microscopic colitis. Recommendation:           - Patient has a contact number available for                            emergencies. The signs and symptoms of potential                            delayed complications were discussed with the  patient. Return to normal activities tomorrow.                            Written discharge instructions were provided to the                            patient.                           - Resume previous diet.                           - Continue present medications.                           - Repeat colonoscopy is recommended. The                            colonoscopy date will be determined after pathology                            results from today's exam become available for                            review. Iva Boop, MD 04/26/2017 8:31:46 AM This report has been signed electronically.

## 2017-04-27 ENCOUNTER — Telehealth: Payer: Self-pay | Admitting: *Deleted

## 2017-04-27 NOTE — Telephone Encounter (Signed)
  Follow up Call-  Call back number 04/26/2017  Post procedure Call Back phone  # [phone]  Permission to leave phone message Yes  Some recent data might be hidden     Patient questions:  Do you have a fever, pain , or abdominal swelling? No. Pain Score  0 *  Have you tolerated food without any problems? Yes.    Have you been able to return to your normal activities? Yes  Do you have any questions about your discharge instructions: Diet   No. Medications  No. Follow up visit  No.  Do you have questions or concerns about your Care? No.  Actions: * If pain score is 4 or above: No action needed, pain <4.

## 2017-05-03 ENCOUNTER — Encounter: Payer: Self-pay | Admitting: Internal Medicine

## 2017-05-03 DIAGNOSIS — K5000 Crohn's disease of small intestine without complications: Secondary | ICD-10-CM

## 2017-05-03 HISTORY — DX: Crohn's disease of small intestine without complications: K50.00

## 2017-05-03 NOTE — Progress Notes (Signed)
Call from office  Ileal bxs inflammation - I think Crohn's Colon bxs NL  Start budesonide 3 mg take 3 each day # 90 w/ 2 RF - this is a Tx for Crohn's - hope will help/elimnate his diarrhea See me in 2 months  [facility] Cancel 2025 colon recall and replace with 2028 recall

## 2017-05-04 ENCOUNTER — Other Ambulatory Visit: Payer: Self-pay

## 2017-05-04 MED ORDER — BUDESONIDE 3 MG PO CPEP: 9.0000 mg | ORAL_CAPSULE | Freq: Every day | ORAL | 2 refills | Status: DC

## 2017-05-25 ENCOUNTER — Encounter: Payer: Self-pay | Admitting: Family Medicine

## 2017-05-25 ENCOUNTER — Ambulatory Visit: Payer: 59 | Admitting: Family Medicine

## 2017-05-25 VITALS — BP 128/80 | Temp 97.6°F | Ht 70.0 in | Wt 225.0 lb

## 2017-05-25 DIAGNOSIS — J029 Acute pharyngitis, unspecified: Secondary | ICD-10-CM | POA: Diagnosis not present

## 2017-05-25 MED ORDER — AMOXICILLIN-POT CLAVULANATE 875-125 MG PO TABS: 1.0000 | ORAL_TABLET | Freq: Two times a day (BID) | ORAL | 0 refills | Status: DC

## 2017-05-25 NOTE — Patient Instructions (Signed)
WE NOW OFFER   [name]'s FAST TRACK!!!  SAME DAY Appointments for ACUTE CARE  Such as: Sprains, Injuries, cuts, abrasions, rashes, muscle pain, joint pain, back pain Colds, flu, sore throats, headache, allergies, cough, fever  Ear pain, sinus and eye infections Abdominal pain, nausea, vomiting, diarrhea, upset stomach Animal/insect bites  3 Easy Ways to Schedule: Walk-In Scheduling Call in scheduling Mychart Sign-up: [email or web address]         

## 2017-05-25 NOTE — Progress Notes (Signed)
   Subjective:    Patient ID: [name], male    DOB: 27-Nov-1950, 66 y.o.   MRN: [id number]  HPI Here for 2 days of a severe ST with a dry cough. No fever. He is on steroids for his Crohns disease currently.    Review of Systems  Constitutional: Negative.   HENT: Positive for sore throat. Negative for congestion, postnasal drip, sinus pressure and sinus pain.   Eyes: Negative.   Respiratory: Positive for cough.        Objective:   Physical Exam  Constitutional: He appears well-developed and well-nourished.  HENT:  Right Ear: External ear normal.  Left Ear: External ear normal.  Nose: Nose normal.  OP is red without exudate   Eyes: Conjunctivae are normal.  Neck: No thyromegaly present.  Pulmonary/Chest: Effort normal and breath sounds normal. No respiratory distress. He has no wheezes. He has no rales.  Lymphadenopathy:    He has no cervical adenopathy.          Assessment & Plan:  Pharyngitis, start on Augmentin and add Tylenol prn.  Gershon CraneStephen Lofton Leon, MD

## 2017-05-28 ENCOUNTER — Other Ambulatory Visit: Payer: Self-pay

## 2017-05-28 MED ORDER — BUDESONIDE 3 MG PO CPEP: 9.0000 mg | ORAL_CAPSULE | Freq: Every day | ORAL | 0 refills | Status: DC

## 2017-05-28 NOTE — Telephone Encounter (Signed)
[facility] [address]sent request for a 90 day supply of patients Budesonide.  We had recently sent it in for a months worth and 2 refills so resent it for the 90 day supply as requested.

## 2017-06-22 ENCOUNTER — Ambulatory Visit: Payer: 59 | Admitting: Family Medicine

## 2017-06-22 ENCOUNTER — Encounter: Payer: Self-pay | Admitting: Family Medicine

## 2017-06-22 VITALS — BP 130/64 | HR 62 | Temp 97.6°F | Wt 225.6 lb

## 2017-06-22 DIAGNOSIS — J0191 Acute recurrent sinusitis, unspecified: Secondary | ICD-10-CM | POA: Diagnosis not present

## 2017-06-22 MED ORDER — AMOXICILLIN-POT CLAVULANATE 875-125 MG PO TABS: 1.0000 | ORAL_TABLET | Freq: Two times a day (BID) | ORAL | 0 refills | Status: DC

## 2017-06-22 NOTE — Progress Notes (Signed)
   Subjective:    Patient ID: Stann MainlandJohn C Placide, male    DOB: July 13, 1951, 66 y.o.   MRN: [id number]  HPI Here for recurrent PND, ST, and dry cough. We treated this with Augmentin last month and this helped for a few weeks. We have spoken about how an immune suppressing agent like Entocort can cause recurrent respiratory infections.    Review of Systems  Constitutional: Negative.   HENT: Positive for postnasal drip, sinus pressure and sore throat. Negative for sinus pain.   Eyes: Negative.   Respiratory: Positive for cough.        Objective:   Physical Exam  Constitutional: He appears well-developed and well-nourished.  HENT:  Right Ear: External ear normal.  Left Ear: External ear normal.  Nose: Nose normal.  Mouth/Throat: Oropharynx is clear and moist.  Eyes: Conjunctivae are normal.  Neck: No thyromegaly present.  Pulmonary/Chest: Effort normal and breath sounds normal. No respiratory distress. He has no wheezes. He has no rales.  Lymphadenopathy:    He has no cervical adenopathy.          Assessment & Plan:  Recurrent sinusitis, treat with Augmentin. He will meet with his GI doctor next week, and I encouraged im to speak to them about these infections. Possibly the dose of Entocort can be reduced, etc.  Gershon CraneStephen Silvia Markuson, MD

## 2017-06-28 ENCOUNTER — Ambulatory Visit: Payer: 59 | Admitting: Internal Medicine

## 2017-06-28 ENCOUNTER — Other Ambulatory Visit (INDEPENDENT_AMBULATORY_CARE_PROVIDER_SITE_OTHER): Payer: 59

## 2017-06-28 ENCOUNTER — Encounter: Payer: Self-pay | Admitting: Internal Medicine

## 2017-06-28 VITALS — BP 146/74 | HR 72 | Ht 70.0 in | Wt 226.0 lb

## 2017-06-28 DIAGNOSIS — L409 Psoriasis, unspecified: Secondary | ICD-10-CM | POA: Diagnosis not present

## 2017-06-28 DIAGNOSIS — K50018 Crohn's disease of small intestine with other complication: Secondary | ICD-10-CM

## 2017-06-28 DIAGNOSIS — Z79899 Other long term (current) drug therapy: Secondary | ICD-10-CM

## 2017-06-28 DIAGNOSIS — Z7960 Long term (current) use of unspecified immunomodulators and immunosuppressants: Secondary | ICD-10-CM

## 2017-06-28 LAB — COMPREHENSIVE METABOLIC PANEL
ALBUMIN: 4.4 g/dL (ref 3.5–5.2)
ALT: 36 U/L (ref 0–53)
AST: 44 U/L — AB (ref 0–37)
Alkaline Phosphatase: 58 U/L (ref 39–117)
BUN: 10 mg/dL (ref 6–23)
CHLORIDE: 104 meq/L (ref 96–112)
CO2: 29 meq/L (ref 19–32)
CREATININE: 0.98 mg/dL (ref 0.40–1.50)
Calcium: 9.1 mg/dL (ref 8.4–10.5)
GFR: 81.72 mL/min (ref >60.00)
GLUCOSE: 78 mg/dL (ref 70–99)
POTASSIUM: 3.9 meq/L (ref 3.5–5.1)
SODIUM: 140 meq/L (ref 135–145)
Total Bilirubin: 0.6 mg/dL (ref 0.2–1.2)
Total Protein: 7.4 g/dL (ref 6.0–8.3)

## 2017-06-28 LAB — CBC WITH DIFFERENTIAL/PLATELET
BASOS PCT: 0.6 % (ref 0.0–3.0)
Basophils Absolute: 0.1 10*3/uL (ref 0.0–0.1)
EOS ABS: 0.2 10*3/uL (ref 0.0–0.7)
EOS PCT: 1.3 % (ref 0.0–5.0)
HCT: 50.6 % (ref 39.0–52.0)
HEMOGLOBIN: 16.5 g/dL (ref 13.0–17.0)
LYMPHS ABS: 2.9 10*3/uL (ref 0.7–4.0)
Lymphocytes Relative: 23.6 % (ref 12.0–46.0)
MCHC: 32.5 g/dL (ref 30.0–36.0)
MCV: 89.5 fl (ref 78.0–100.0)
MONO ABS: 1 10*3/uL (ref 0.1–1.0)
Monocytes Relative: 7.7 % (ref 3.0–12.0)
NEUTROS ABS: 8.3 10*3/uL — AB (ref 1.4–7.7)
NEUTROS PCT: 66.8 % (ref 43.0–77.0)
PLATELETS: 216 10*3/uL (ref 150.0–400.0)
RBC: 5.66 Mil/uL (ref 4.22–5.81)
RDW: 14.3 % (ref 11.5–15.5)
WBC: 12.4 10*3/uL — AB (ref 4.0–10.5)

## 2017-06-28 NOTE — Progress Notes (Signed)
[name] 66 y.o. 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 We reviewed different treatment plans today.  I have recommended he take 6-MP for 6 months and Humira long-term.  His wife has rheumatoid arthritis and has been on various Biologics and he is familiar.  Side effects reviewed handout given.  Reviewed increased risk of infections, lymphoma other tumors perhaps, unpredictable reactions.  Reviewed the need to monitor LFTs and CBC on 6-MP.  Rationale for using 6-MP is to take 6 months to reduce immunogenicity and hopefully reduce the risk of antibodies to Humira. Start 6 MP + Humira once I review all his labs.  Stay on budesonide for now.  Anticipate follow-up with me in late February sooner as needed.  Psoriasis Not very symptomatic he says.  He could get benefit from the Humira for this I suspect.     Subjective:   Chief Complaint: Initial follow-up after diagnosing Crohn's ileitis  HPI [name] is here doing well on budesonide saying he is 90% better.  He has been having some upper respiratory infection problems and was started on Augmentin for sinusitis, he wonders if it is related to taking budesonide and can he reduce the dose to 6 mg.  He is very pleased with how he feels if he eats spicy foods etc. he may have several loose to watery bowel movements.  Much better overall as reported.  As far as the upper respiratory infection and sinusitis, he does have a 26-year-old grandson at home who goes to school and has been sick quite a bit.  No other issues to report right now. Allergies  Allergen Reactions  . Shellfish Allergy     Throat stated to close   Current Meds  Medication Sig  . albuterol (PROVENTIL HFA;VENTOLIN HFA) 108 (90 BASE) MCG/ACT inhaler Inhale 2 puffs into the lungs every 4 (four) hours as needed for wheezing or shortness of breath.  Marland Kitchen amoxicillin-clavulanate (AUGMENTIN)  875-125 MG tablet Take 1 tablet by mouth 2 (two) times daily.  . budesonide (ENTOCORT EC) 3 MG 24 hr capsule Take 3 capsules (9 mg total) daily by mouth.  . halobetasol (ULTRAVATE) 0.05 % ointment Apply topically 2 (two) times daily.  . hyoscyamine (LEVSIN SL) 0.125 MG SL tablet PLACE 1 TABLET UNDER THE TONGUE EVERY 4 HOURS AS NEEDED FOR CRAMPING (URGENT DEFECATION)  . Multiple Vitamin (MULTIVITAMIN WITH MINERALS) TABS Take 1 tablet by mouth daily.  . traZODone (DESYREL) 100 MG tablet Take 1 tablet (100 mg total) by mouth at bedtime.   Past Medical History:  Diagnosis Date  . Acute prostatitis 07/24/2007   Qualifier: Diagnosis of  By: [name] MD, [name]   . Allergy    mild  . Arthritis    osteoarthritis  . Asthma   . Blood transfusion without reported diagnosis   . BPH (benign prostatic hypertrophy) with urinary obstruction   . Crohn's ileitis ([place]) suspected 05/03/2017  . Dilated aortic root ([place])    noted on echo 08/2012  . Diverticulitis of colon   . EPIDIDYMITIS 02/15/2010   Qualifier: Diagnosis of  By: [name] MD, [name]   . GERD (gastroesophageal reflux disease)   . H/O: GI bleed   . Hemorrhoids   . Hepatitis 1975   unknown type   . HERPES SIMPLEX INFECTION 10/14/2007   Qualifier: Diagnosis of  By: [name] MD, [name]   . Hiatal hernia   .  Ileus following gastrointestinal surgery 12/26/2011  . Psoriasis    sees [name] at [facility].  . Recurrent ventral incisional hernia 05/10/2012  . SVT (supraventricular tachycardia) ([place])   . Ulcer 08/21/2013   ileal   Past Surgical History:  Procedure Laterality Date  . BOWEL RESECTION  12/19/2011   Procedure: SMALL BOWEL RESECTION;  Surgeon: [name], MD;  Location: [facility];  Service: General;  Laterality: N/A;  with anastamosis and insertion mesh  . BRONCHOSCOPY    . COLON SURGERY  01/2004  . COLONOSCOPY    . COLONOSCOPY W/ BIOPSIES  08/21/2013  . CYSTOSCOPY    . ESOPHAGOGASTRODUODENOSCOPY    . HEMICOLECTOMY      left side, at [facility], diverticulitis  . HEMORRHOID BANDING    . HERNIA REPAIR     [phone] incisional hernia  . ILEOSTOMY    . ILEOSTOMY CLOSURE    . INSERTION OF MESH  07/31/2012   Procedure: INSERTION OF MESH;  Surgeon: [name], MD;  Location: [facility];  Service: General;;  . LAPAROTOMY  12/19/2011   Procedure: EXPLORATORY LAPAROTOMY;  Surgeon: [name], MD;  Location: [facility];  Service: General;  Laterality: N/A;  . TONSILLECTOMY    . UPPER GASTROINTESTINAL ENDOSCOPY    . VASECTOMY    . 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   Review of Systems As per HPI Psoriasis not bothering him right now  Objective:   Physical Exam @BP  (!) 146/74   Pulse 72   Ht 5' 10"  (1.778 m)   Wt 226 lb (102.5 kg)   BMI 32.43 kg/m @  General:  NAD Eyes:   anicteric Lungs:  clear Heart::  S1S2 no rubs, murmurs or gallops Abdomen:  soft and nontender, BS+ Ext:   no edema, cyanosis or clubbing    Data Reviewed:  Reviewed colonoscopy and pathology reports today

## 2017-06-28 NOTE — Patient Instructions (Signed)
Your physician has requested that you go to the basement for the lab work before leaving today. We will call you with results and plans.  You may decrease your budesonide to 6 mg if you want to.   We are giving you information to read on biologics and Immunomodulator's.    Follow up with Dr Leone Payor in late February.   I appreciate the opportunity to care for you. Stan Head, MD, [facility]

## 2017-06-28 NOTE — Assessment & Plan Note (Signed)
Not very symptomatic he says.  He could get benefit from the Humira for this I suspect.

## 2017-06-28 NOTE — Assessment & Plan Note (Addendum)
The response to budesonide tells [place] this is Crohn's disease. We reviewed different treatment plans today.  I have recommended he take 6-MP for 6 months and Humira long-term.  His wife has rheumatoid arthritis and has been on various Biologics and he is familiar.  Side effects reviewed handout given.  Reviewed increased risk of infections, lymphoma other tumors perhaps, unpredictable reactions.  Reviewed the need to monitor LFTs and CBC on 6-MP.  Rationale for using 6-MP is to take 6 months to reduce immunogenicity and hopefully reduce the risk of antibodies to Humira. Start 6 MP + Humira once I review all his labs.  Stay on budesonide for now.  Anticipate follow-up with me in late February sooner as needed.

## 2017-06-29 LAB — TIQ-NTM

## 2017-06-29 LAB — QUANTIFERON TB GOLD ASSAY (BLOOD)

## 2017-06-29 NOTE — Progress Notes (Signed)
Need to sort out issue presented in note please

## 2017-07-01 NOTE — Progress Notes (Signed)
They cancelled the quantiferon - this needs to be done.  Should know status of thiopurine methyl transferase also before we have him get redrawn

## 2017-07-05 ENCOUNTER — Other Ambulatory Visit: Payer: 59

## 2017-07-09 ENCOUNTER — Other Ambulatory Visit: Payer: 59

## 2017-07-09 LAB — QUANTIFERON-TB GOLD PLUS
Mitogen-NIL: >10 IU/mL
NIL: 0.08 [IU]/mL
QUANTIFERON-TB GOLD PLUS: NEGATIVE
TB1-NIL: 0.04 IU/mL
TB2-NIL: 0 IU/mL

## 2017-07-09 LAB — THIOPURINE METHYLTRANSFERASE (TPMT), RBC: Thiopurine Methyltransferase, RBC: 17 nmol/hr/mL RBC

## 2017-07-09 LAB — HEPATITIS B CORE ANTIBODY, TOTAL: HEP B C TOTAL AB: NONREACTIVE

## 2017-07-09 LAB — HEPATITIS B SURFACE ANTIBODY,QUALITATIVE: Hep B S Ab: NONREACTIVE

## 2017-07-09 LAB — TEST AUTHORIZATION

## 2017-07-09 LAB — HEPATITIS B SURFACE ANTIGEN: Hepatitis B Surface Ag: NONREACTIVE

## 2017-07-09 LAB — QUANTIFERON(R)-TB

## 2017-07-11 NOTE — Progress Notes (Signed)
Labs are all ok  He needs: 1) Humira citrate free Rx starter pack x 1 2)  then 40 uq every other week w/ 2 refills 3) 6 MP 50 mg po qd 4) CBC and LFT in 2 weeks after he starts 6 MP 5) Stay on budesonide for now 6) I will see him as planned 2/19

## 2017-07-19 ENCOUNTER — Other Ambulatory Visit: Payer: Self-pay

## 2017-07-19 DIAGNOSIS — Z79899 Other long term (current) drug therapy: Secondary | ICD-10-CM

## 2017-07-19 MED ORDER — ADALIMUMAB 40 MG/0.4ML ~~LOC~~ AJKT: 40.0000 mg | AUTO-INJECTOR | SUBCUTANEOUS | 6 refills | Status: DC

## 2017-07-19 MED ORDER — MERCAPTOPURINE 50 MG PO TABS: 50.0000 mg | ORAL_TABLET | Freq: Every day | ORAL | 3 refills | Status: DC

## 2017-07-19 MED ORDER — ADALIMUMAB 80 MG/0.8ML ~~LOC~~ AJKT: 160.0000 mg | AUTO-INJECTOR | Freq: Once | SUBCUTANEOUS | 0 refills | Status: DC

## 2017-07-30 ENCOUNTER — Telehealth: Payer: Self-pay | Admitting: Internal Medicine

## 2017-07-30 MED ORDER — MERCAPTOPURINE 50 MG PO TABS: 50.0000 mg | ORAL_TABLET | Freq: Every day | ORAL | 3 refills | Status: DC

## 2017-07-30 MED ORDER — ADALIMUMAB 40 MG/0.4ML ~~LOC~~ AJKT: 40.0000 mg | AUTO-INJECTOR | SUBCUTANEOUS | 6 refills | Status: DC

## 2017-07-30 NOTE — Telephone Encounter (Signed)
Patient notified that I sent a refill of both rx to his pharmacies.

## 2017-08-14 ENCOUNTER — Encounter: Payer: Self-pay | Admitting: Family Medicine

## 2017-08-14 ENCOUNTER — Ambulatory Visit: Payer: 59 | Admitting: Family Medicine

## 2017-08-14 VITALS — BP 114/60 | HR 69 | Temp 97.6°F | Wt 224.2 lb

## 2017-08-14 DIAGNOSIS — J209 Acute bronchitis, unspecified: Secondary | ICD-10-CM

## 2017-08-14 MED ORDER — HYDROCODONE-HOMATROPINE 5-1.5 MG/5ML PO SYRP: 5.0000 mL | ORAL_SOLUTION | ORAL | 0 refills | Status: DC | PRN

## 2017-08-14 MED ORDER — AMOXICILLIN-POT CLAVULANATE 875-125 MG PO TABS: 1.0000 | ORAL_TABLET | Freq: Two times a day (BID) | ORAL | 0 refills | Status: DC

## 2017-08-14 NOTE — Progress Notes (Signed)
   Subjective:    Patient ID: [name], male    DOB: 01/10/1951, 67 y.o.   MRN: [id number]  HPI Here for several days of chest congestion and coughing up green sputum. No fever. On Mucinex.    Review of Systems  Constitutional: Negative.   HENT: Negative.   Eyes: Negative.   Respiratory: Positive for cough and chest tightness. Negative for shortness of breath and wheezing.        Objective:   Physical Exam  Constitutional: He appears well-developed and well-nourished.  HENT:  Right Ear: External ear normal.  Nose: Nose normal.  Mouth/Throat: Oropharynx is clear and moist.  Eyes: Conjunctivae are normal.  Neck: No thyromegaly present.  Pulmonary/Chest: Effort normal. No respiratory distress. He has no wheezes. He has no rales.  Scattered rhonchi   Lymphadenopathy:    He has no cervical adenopathy.          Assessment & Plan:  Bronchitis, treat with Augmentin.  Gershon Crane, MD

## 2017-08-15 ENCOUNTER — Encounter: Payer: Self-pay | Admitting: Internal Medicine

## 2017-08-15 ENCOUNTER — Telehealth: Payer: Self-pay | Admitting: Family Medicine

## 2017-08-15 NOTE — Telephone Encounter (Signed)
Copied from CRM [id number]. Topic: Quick Communication - See Telephone Encounter >> Aug 15, 2017  1:34 PM Windy Kalata, NT wrote: CRM for notification. See Telephone encounter for:  08/15/17.  Pt was prescribed hydrocodone syrup and the pharmacy it was sent too they will not have it in stock for a few days. [facility] [address] Please advise

## 2017-08-16 MED ORDER — HYDROCODONE-HOMATROPINE 5-1.5 MG/5ML PO SYRP: 5.0000 mL | ORAL_SOLUTION | ORAL | 0 refills | Status: DC | PRN

## 2017-08-16 NOTE — Telephone Encounter (Signed)
Sent to the new pharmacy  

## 2017-08-16 NOTE — Telephone Encounter (Signed)
Called and spoke with pt. Pt advised and voiced understanding.  

## 2017-08-20 ENCOUNTER — Ambulatory Visit: Payer: 59 | Admitting: Family Medicine

## 2017-08-20 ENCOUNTER — Encounter: Payer: Self-pay | Admitting: Family Medicine

## 2017-08-20 VITALS — BP 136/76 | HR 66 | Temp 97.5°F | Wt 217.8 lb

## 2017-08-20 DIAGNOSIS — J209 Acute bronchitis, unspecified: Secondary | ICD-10-CM

## 2017-08-20 MED ORDER — LEVOFLOXACIN 500 MG PO TABS: 500.0000 mg | ORAL_TABLET | Freq: Every day | ORAL | 0 refills | Status: AC

## 2017-08-20 MED ORDER — ALBUTEROL SULFATE HFA 108 (90 BASE) MCG/ACT IN AERS: 2.0000 | INHALATION_SPRAY | RESPIRATORY_TRACT | 5 refills | Status: DC | PRN

## 2017-08-20 NOTE — Progress Notes (Signed)
   Subjective:    Patient ID: [name], male    DOB: Oct 19, 1950, 67 y.o.   MRN: [id number]  HPI Here for continuing bronchitis symptoms. He was here on 08-14-17 and was given Augmentin. He has been taking this along with 1200 mg of Mucinex bid, however his chest is still tight and he has a dry cough. No fever.    Review of Systems  Constitutional: Negative.   HENT: Negative.   Eyes: Negative.   Respiratory: Positive for cough, chest tightness and shortness of breath. Negative for wheezing.   Cardiovascular: Negative.        Objective:   Physical Exam  Constitutional: He appears well-developed and well-nourished.  HENT:  Right Ear: External ear normal.  Left Ear: External ear normal.  Nose: Nose normal.  Mouth/Throat: Oropharynx is clear and moist.  Eyes: Conjunctivae are normal.  Neck: No thyromegaly present.  Pulmonary/Chest: Effort normal. No respiratory distress. He has no wheezes. He has no rales.  Scattered rhonchi   Lymphadenopathy:    He has no cervical adenopathy.          Assessment & Plan:  Bronchitis. Stop the Augmentin and switch to Levaquin for 10 days. I refilled his albuterol inhaler to use as well.  Gershon Crane, MD

## 2017-09-01 ENCOUNTER — Other Ambulatory Visit: Payer: Self-pay | Admitting: Internal Medicine

## 2017-09-01 DIAGNOSIS — K50018 Crohn's disease of small intestine with other complication: Secondary | ICD-10-CM

## 2017-09-03 NOTE — Telephone Encounter (Signed)
How many refills Sir, thank you. 

## 2017-09-04 ENCOUNTER — Ambulatory Visit: Payer: 59 | Admitting: Internal Medicine

## 2017-09-04 NOTE — Telephone Encounter (Signed)
He needs to do CBC and LFT's  And then refill each x 3 months

## 2017-09-04 NOTE — Telephone Encounter (Signed)
I spoke with [name] and he said he can come on Friday to have labs drawn as he is currently out of town. He started his Humira last Friday. He ask is he to continue the Entocort and along with his Humira Sir?

## 2017-09-05 NOTE — Telephone Encounter (Signed)
Continue 6 MP for sure  I would see if he can stop the entocort now

## 2017-09-05 NOTE — Telephone Encounter (Signed)
I spoke with [name] and he request I refill both rx's and he won't pick up the Entocort.  He is alittle nervous about stopping it but will try and he will MyChart [place] how he is doing. He verbalized understanding to continue the .

## 2017-09-07 ENCOUNTER — Other Ambulatory Visit (INDEPENDENT_AMBULATORY_CARE_PROVIDER_SITE_OTHER): Payer: 59

## 2017-09-07 DIAGNOSIS — K50018 Crohn's disease of small intestine with other complication: Secondary | ICD-10-CM | POA: Diagnosis not present

## 2017-09-07 DIAGNOSIS — Z79899 Other long term (current) drug therapy: Secondary | ICD-10-CM

## 2017-09-07 DIAGNOSIS — Z7960 Long term (current) use of unspecified immunomodulators and immunosuppressants: Secondary | ICD-10-CM

## 2017-09-07 LAB — CBC WITH DIFFERENTIAL/PLATELET
BASOS ABS: 0 10*3/uL (ref 0.0–0.1)
BASOS PCT: 0.4 % (ref 0.0–3.0)
EOS ABS: 0.2 10*3/uL (ref 0.0–0.7)
Eosinophils Relative: 1.5 % (ref 0.0–5.0)
HEMATOCRIT: 47.4 % (ref 39.0–52.0)
Hemoglobin: 15.6 g/dL (ref 13.0–17.0)
LYMPHS PCT: 30.4 % (ref 12.0–46.0)
Lymphs Abs: 3.1 10*3/uL (ref 0.7–4.0)
MCHC: 32.9 g/dL (ref 30.0–36.0)
MCV: 89 fl (ref 78.0–100.0)
MONOS PCT: 5.3 % (ref 3.0–12.0)
Monocytes Absolute: 0.5 10*3/uL (ref 0.1–1.0)
NEUTROS ABS: 6.4 10*3/uL (ref 1.4–7.7)
Neutrophils Relative %: 62.4 % (ref 43.0–77.0)
PLATELETS: 223 10*3/uL (ref 150.0–400.0)
RBC: 5.33 Mil/uL (ref 4.22–5.81)
RDW: 15 % (ref 11.5–15.5)
WBC: 10.2 10*3/uL (ref 4.0–10.5)

## 2017-09-07 LAB — BASIC METABOLIC PANEL
BUN: 13 mg/dL (ref 6–23)
CHLORIDE: 103 meq/L (ref 96–112)
CO2: 30 mEq/L (ref 19–32)
CREATININE: 1 mg/dL (ref 0.40–1.50)
Calcium: 9.4 mg/dL (ref 8.4–10.5)
GFR: 79.79 mL/min (ref >60.00)
GLUCOSE: 119 mg/dL — AB (ref 70–99)
POTASSIUM: 3.9 meq/L (ref 3.5–5.1)
Sodium: 141 mEq/L (ref 135–145)

## 2017-09-07 LAB — HEPATIC FUNCTION PANEL
ALT: 17 U/L (ref 0–53)
AST: 24 U/L (ref 0–37)
Albumin: 4.2 g/dL (ref 3.5–5.2)
Alkaline Phosphatase: 55 U/L (ref 39–117)
BILIRUBIN DIRECT: 0.1 mg/dL (ref 0.0–0.3)
BILIRUBIN TOTAL: 0.6 mg/dL (ref 0.2–1.2)
Total Protein: 6.9 g/dL (ref 6.0–8.3)

## 2017-09-10 NOTE — Progress Notes (Signed)
Labs are ok Will see him in March My Chart note

## 2017-09-21 ENCOUNTER — Encounter: Payer: Self-pay | Admitting: Physician Assistant

## 2017-09-21 ENCOUNTER — Ambulatory Visit: Payer: 59 | Admitting: Physician Assistant

## 2017-09-21 ENCOUNTER — Telehealth: Payer: Self-pay | Admitting: Internal Medicine

## 2017-09-21 VITALS — BP 138/78 | HR 68 | Ht 70.0 in | Wt 225.8 lb

## 2017-09-21 DIAGNOSIS — R1084 Generalized abdominal pain: Secondary | ICD-10-CM | POA: Diagnosis not present

## 2017-09-21 DIAGNOSIS — K50019 Crohn's disease of small intestine with unspecified complications: Secondary | ICD-10-CM | POA: Diagnosis not present

## 2017-09-21 DIAGNOSIS — R109 Unspecified abdominal pain: Secondary | ICD-10-CM

## 2017-09-21 MED ORDER — BUDESONIDE 3 MG PO CPEP: 9.0000 mg | ORAL_CAPSULE | Freq: Every day | ORAL | 0 refills | Status: DC

## 2017-09-21 NOTE — Telephone Encounter (Signed)
Crohn's pt having a flare up since 5 days ago, wants to know what to do. Please call him.

## 2017-09-21 NOTE — Patient Instructions (Signed)
If you are age 65 or older, your body mass index should be between 23-30. Your Body mass index is 32.4 kg/m. If this is out of the aforementioned range listed, please consider follow up with your Primary Care Provider.  If you are age 46 or younger, your body mass index should be between 19-25. Your Body mass index is 32.4 kg/m. If this is out of the aformentioned range listed, please consider follow up with your Primary Care Provider.   We have sent the following medications to your pharmacy for you to pick up at your convenience: Budesonide 3 mg  Keep scheduled appointment with [name].  Thank you for choosing me and [facility].   Ellouise Newer, PA-C

## 2017-09-21 NOTE — Telephone Encounter (Signed)
Patient with cramping and pain for the last several days.  He has tried dietary modifications with little improvement.  He will come in today and see Hyacinth Meeker, PA At 2:15

## 2017-09-21 NOTE — Progress Notes (Addendum)
Chief Complaint: Crohn's Flare  HPI:     [name] is a 67 year old male with a past medical history of Crohn's ileitis, who follows with [name] and presents to clinic today with complaint of a "Crohn's flare".    Last office visit 06/28/17, doing well on Budesonide.  At that time was recommended he take 6-MP for 6 months and start Humira long-term.    Called the clinic today describing cramping abdominal pain for the past several days, has tried dietary modifications with little improvement.  Per review of chart recently saw PCP 08/20/17 and had taken Augmentin for bronchitis, which did not help and was switched to Levaquin for 10 days.      Today, explains that he started his Humira about 2 weeks ago, he is due for his first maintenance injection next Friday.  He stopped his Budesonide as directed about 2 weeks ago prior to starting the Humira.  He has also started his Mercaptopurine 50 mg daily.  Stools have become closer to a 4 on the Bristol stool scale but on Monday he started with "severe abdominal cramping and bloating worse around my mesh", rated as a 6/10.  He thought maybe he had just ate something bad, but this continued into the next day.  He restarted his Budesonide 9 mg daily on Tuesday and has continued to take this.  As of this morning, he does feel some better and describes his pain as a 2/10.  Denies any real change in bowel habits or blood in his stool over the past 5 days.  Has continued his Hyoscyamine 0.125 mg every 4 hours which also helps with abdominal pain.    Denies fever, chills, weight loss, anorexia, nausea, vomiting, heartburn, reflux or symptoms that awaken him at night.  Past Medical History:  Diagnosis Date  . Acute prostatitis 07/24/2007   Qualifier: Diagnosis of  By: [name] MD, [name]   . Allergy    mild  . Arthritis    osteoarthritis  . Asthma   . Blood transfusion without reported diagnosis   . BPH (benign prostatic hypertrophy) with urinary  obstruction   . Crohn's ileitis ([place]) suspected 05/03/2017  . Dilated aortic root ([place])    noted on echo 08/2012  . Diverticulitis of colon   . EPIDIDYMITIS 02/15/2010   Qualifier: Diagnosis of  By: [name] MD, [name]   . GERD (gastroesophageal reflux disease)   . H/O: GI bleed   . Hemorrhoids   . Hepatitis 1975   unknown type   . HERPES SIMPLEX INFECTION 10/14/2007   Qualifier: Diagnosis of  By: [name] MD, [name]   . Hiatal hernia   . Ileus following gastrointestinal surgery 12/26/2011  . Psoriasis    sees [name] at [facility].  . Recurrent ventral incisional hernia 05/10/2012  . SVT (supraventricular tachycardia) ([place])   . Ulcer 08/21/2013   ileal    Past Surgical History:  Procedure Laterality Date  . BOWEL RESECTION  12/19/2011   Procedure: SMALL BOWEL RESECTION;  Surgeon: [name], MD;  Location: [facility];  Service: General;  Laterality: N/A;  with anastamosis and insertion mesh  . BRONCHOSCOPY    . COLON SURGERY  01/2004  . COLONOSCOPY    . COLONOSCOPY W/ BIOPSIES  08/21/2013  . CYSTOSCOPY    . ESOPHAGOGASTRODUODENOSCOPY    . HEMICOLECTOMY     left side, at [facility], diverticulitis  . HEMORRHOID BANDING    . HERNIA REPAIR  1'6109 incisional hernia  . ILEOSTOMY    . ILEOSTOMY CLOSURE    . INSERTION OF MESH  07/31/2012   Procedure: INSERTION OF MESH;  Surgeon: [name], MD;  [facility]: [facility];  Service: General;;  . LAPAROTOMY  12/19/2011   Procedure: EXPLORATORY LAPAROTOMY;  Surgeon: [name], MD;  Location: [facility];  Service: General;  Laterality: N/A;  . TONSILLECTOMY    . UPPER GASTROINTESTINAL ENDOSCOPY    . VASECTOMY    . VENTRAL HERNIA REPAIR  07/31/2012   Procedure: HERNIA REPAIR VENTRAL ADULT;  Surgeon: [name], MD;  Location: [facility];  Service: General;  Laterality: N/A;    Current Outpatient Medications  Medication Sig Dispense Refill  . Adalimumab (HUMIRA PEN) 40 MG/0.4ML PNKT Inject 40 mg into the skin every  14 (fourteen) days. After the completion of the starter kit 2 each 6  . Adalimumab (HUMIRA PEN-CD/UC/HS STARTER) 80 MG/0.8ML PNKT Inject 160 mg into the skin once for 1 dose. On day 0, then inject 80 mg on day 14 3 each 0  . albuterol (PROVENTIL HFA;VENTOLIN HFA) 108 (90 Base) MCG/ACT inhaler Inhale 2 puffs into the lungs every 4 (four) hours as needed for wheezing or shortness of breath. 1 Inhaler 5  . budesonide (ENTOCORT EC) 3 MG 24 hr capsule TAKE 3 CAPSULES (9 MG TOTAL) DAILY BY MOUTH. 90 capsule 0  . halobetasol (ULTRAVATE) 0.05 % ointment Apply topically 2 (two) times daily. 50 g 2  . HYDROcodone-homatropine (HYDROMET) 5-1.5 MG/5ML syrup Take 5 mLs by mouth every 4 (four) hours as needed. 240 mL 0  . hyoscyamine (LEVSIN SL) 0.125 MG SL tablet PLACE 1 TABLET UNDER THE TONGUE EVERY 4 HOURS AS NEEDED FOR CRAMPING (URGENT DEFECATION) 60 tablet 2  . mercaptopurine (PURINETHOL) 50 MG tablet TAKE 1 TAB DAILY. GIVE ON EMPTY STOMACH 1 HR BEFORE OR 2 HRS AFTER MEALS. CAUTION: CHEMOTHERAPY. 90 tablet 0  . Multiple Vitamin (MULTIVITAMIN WITH MINERALS) TABS Take 1 tablet by mouth daily.    . traZODone (DESYREL) 100 MG tablet Take 1 tablet (100 mg total) by mouth at bedtime. 10 tablet 0   No current facility-administered medications for this visit.     Allergies as of 09/21/2017 - Review Complete 08/20/2017  Allergen Reaction Noted  . Shellfish allergy  12/12/2011    Family History  Problem Relation Age of Onset  . Lung cancer Mother   . Leukemia Father   . Hypertension Father   . Heart disease Father   . Heart attack Father   . Prostate cancer Father   . Prostate cancer Paternal Uncle   . Colon cancer Neg Hx   . Stomach cancer Neg Hx   . Colon polyps Neg Hx   . Esophageal cancer Neg Hx   . Rectal cancer Neg Hx     Social History   Socioeconomic History  . Marital status: Married    Spouse name: Not on file  . Number of children: 2  . Years of education: Not on file  . Highest  education level: Not on file  Social Needs  . Financial resource strain: Not on file  . Food insecurity - worry: Not on file  . Food insecurity - inability: Not on file  . Transportation needs - medical: Not on file  . Transportation needs - non-medical: Not on file  Occupational History  . 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 Smoking status: Never Smoker  .  Smokeless tobacco: Never Used  Substance and Sexual Activity  . Alcohol use: Yes    Alcohol/week: 0.0 oz    Comment: once a month or less  . Drug use: No  . Sexual activity: Yes    Partners: Female  Other Topics Concern  . 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 Lungs clear to auscultation bilaterally.   No wheezes, crackles, or rhonchi.  Cardiovascular: Normal S1, S2. No MRG. Regular rate and rhythm. No peripheral edema, cyanosis or pallor.  Gastrointestinal:  Soft, nondistended, mild periumbilical ttp, No rebound or guarding. Normal bowel sounds. No appreciable masses or hepatomegaly. Psychiatric: Demonstrates good judgement and reason without abnormal affect or behaviors.  RECENT LABS AND IMAGING: CBC    Component Value Date/Time   WBC 10.2 09/07/2017 1523   RBC 5.33 09/07/2017 1523   HGB 15.6 09/07/2017 1523   HCT 47.4 09/07/2017 1523   PLT 223.0 09/07/2017 1523   MCV 89.0 09/07/2017 1523   MCH 29.1  10/29/2014 1514   MCHC 32.9 09/07/2017 1523   RDW 15.0 09/07/2017 1523   LYMPHSABS 3.1 09/07/2017 1523   MONOABS 0.5 09/07/2017 1523   EOSABS 0.2 09/07/2017 1523   BASOSABS 0.0 09/07/2017 1523    CMP     Component Value Date/Time   NA 141 09/07/2017 1523   K 3.9 09/07/2017 1523   CL 103 09/07/2017 1523   CO2 30 09/07/2017 1523   GLUCOSE 119 (H) 09/07/2017 1523   BUN 13 09/07/2017 1523   CREATININE 1.00 09/07/2017 1523   CALCIUM 9.4 09/07/2017 1523   PROT 6.9 09/07/2017 1523   ALBUMIN 4.2 09/07/2017 1523   AST 24 09/07/2017 1523   ALT 17 09/07/2017 1523   ALKPHOS 55 09/07/2017 1523   BILITOT 0.6 09/07/2017 1523   GFRNONAA 71 (L) 10/29/2014 1514   GFRAA 82 (L) 10/29/2014 1514    Assessment: 1. Crohn's Ileitis: Previously on Budesonide which was improving of symptoms, switched to 6-MP for 6 months and Humira 2 weeks ago and stopped Budesonide, 4 days ago started with 6/10 abdominal cramping, restarted Budesonide 25m qd 3 days ago and pain improving, now 2/10; Likely a Crohn's flare  Plan: 1.  Refilled Budesonide for 3 mg tabs, 3 tabs daily for the next month with no refills. 2.  Patient already has follow-up with Dr. GCarlean Purlin 2 weeks.  At that time they can discuss the best course for him going forward. 3.  No suspicion for C. difficile or other infectious diarrhea at this time as patient tells me his stools are a 4 on the Bristol stool scale.  No need for stool studies. 4.  Patient will follow with Dr. GCarlean Purlas scheduled.  He will call between now and then with any concerns.  JEllouise Newer PA-C [facility] 09/21/2017, 2:08 PM  Cc: [name], MD   He called back again w/ persistent pain We will do CBC, CMET, amylase and lipase. ? If having a reaction to 6 MP  Gatha Mayer, MD, [facility]

## 2017-09-24 ENCOUNTER — Other Ambulatory Visit: Payer: Self-pay | Admitting: Internal Medicine

## 2017-09-24 ENCOUNTER — Telehealth: Payer: Self-pay | Admitting: Physician Assistant

## 2017-09-24 ENCOUNTER — Other Ambulatory Visit (INDEPENDENT_AMBULATORY_CARE_PROVIDER_SITE_OTHER): Payer: 59

## 2017-09-24 DIAGNOSIS — K853 Drug induced acute pancreatitis without necrosis or infection: Secondary | ICD-10-CM

## 2017-09-24 DIAGNOSIS — R109 Unspecified abdominal pain: Secondary | ICD-10-CM

## 2017-09-24 HISTORY — DX: Drug induced acute pancreatitis without necrosis or infection: K85.30

## 2017-09-24 LAB — CBC WITH DIFFERENTIAL/PLATELET
Basophils Absolute: 0.1 10*3/uL (ref 0.0–0.1)
Basophils Relative: 0.6 % (ref 0.0–3.0)
EOS PCT: 0.9 % (ref 0.0–5.0)
Eosinophils Absolute: 0.1 10*3/uL (ref 0.0–0.7)
HEMATOCRIT: 44.7 % (ref 39.0–52.0)
Hemoglobin: 15.1 g/dL (ref 13.0–17.0)
LYMPHS ABS: 1.9 10*3/uL (ref 0.7–4.0)
LYMPHS PCT: 15.3 % (ref 12.0–46.0)
MCHC: 33.7 g/dL (ref 30.0–36.0)
MCV: 86.8 fl (ref 78.0–100.0)
MONOS PCT: 8.8 % (ref 3.0–12.0)
Monocytes Absolute: 1.1 10*3/uL — ABNORMAL HIGH (ref 0.1–1.0)
NEUTROS ABS: 9.3 10*3/uL — AB (ref 1.4–7.7)
NEUTROS PCT: 74.4 % (ref 43.0–77.0)
Platelets: 222 10*3/uL (ref 150.0–400.0)
RBC: 5.15 Mil/uL (ref 4.22–5.81)
RDW: 14.5 % (ref 11.5–15.5)
WBC: 12.5 10*3/uL — ABNORMAL HIGH (ref 4.0–10.5)

## 2017-09-24 LAB — COMPREHENSIVE METABOLIC PANEL
ALT: 16 U/L (ref 0–53)
AST: 26 U/L (ref 0–37)
Albumin: 4 g/dL (ref 3.5–5.2)
Alkaline Phosphatase: 50 U/L (ref 39–117)
BUN: 13 mg/dL (ref 6–23)
CHLORIDE: 104 meq/L (ref 96–112)
CO2: 27 meq/L (ref 19–32)
Calcium: 9.4 mg/dL (ref 8.4–10.5)
Creatinine, Ser: 0.92 mg/dL (ref 0.40–1.50)
GFR: 87.84 mL/min (ref >60.00)
GLUCOSE: 103 mg/dL — AB (ref 70–99)
POTASSIUM: 3.9 meq/L (ref 3.5–5.1)
Sodium: 138 mEq/L (ref 135–145)
Total Bilirubin: 0.5 mg/dL (ref 0.2–1.2)
Total Protein: 6.7 g/dL (ref 6.0–8.3)

## 2017-09-24 LAB — AMYLASE: AMYLASE: 107 U/L (ref 27–131)

## 2017-09-24 LAB — LIPASE: Lipase: 1041 U/L — ABNORMAL HIGH (ref 11.0–59.0)

## 2017-09-24 MED ORDER — HYDROCODONE-ACETAMINOPHEN 5-325 MG PO TABS: 1.0000 | ORAL_TABLET | ORAL | 0 refills | Status: DC | PRN

## 2017-09-24 NOTE — Telephone Encounter (Signed)
Please see below and call ASAP. Thanks-JLL

## 2017-09-24 NOTE — Telephone Encounter (Signed)
He might have pancreatitis from 6 MP  1) Stop 6 MP for now 2) Come in today for CBC, CMET, amylase and lipase and get those done today 3) Liquid diet 4) I plan to call him after I see lab results which should be this PM if he gets them done soon

## 2017-09-24 NOTE — Telephone Encounter (Signed)
Severe sharp constant pain in the middle of the abd  7/10 pain.  Pain has not gotten any better.  Saw Jessica on 3/8 for crohn's flare.  Was told to follow up today.  Had a firm stool this morning, no bleeding. On budesonide 9 mg daily with no relief.  Has tried 600 mg advil for the pain and that only makes it tolerable.  Please advise

## 2017-09-24 NOTE — Telephone Encounter (Signed)
The pt has been advised and will come in for labs today, he will start a liquid diet and stop 6 mp

## 2017-09-24 NOTE — Progress Notes (Signed)
Discussed dx of pancreatitis due to 6 MP 6 MP discontinued  Asked him to stay on a low fat liquid diet I will rx Vicodin 5/500 - he requested some medication He is tolerating a diet and will go day by day - advised to call back or go to hospital if having severe pain, intractable vomiting, etc

## 2017-09-24 NOTE — Telephone Encounter (Signed)
Dr. Leone Payor. Any recommendations? Does he need imaging?  Thanks-JLL

## 2017-09-24 NOTE — Telephone Encounter (Signed)
Patient states his crohn's flare up has not improved since visit on Friday 3.8.19. Patient states his pain is a 7-8 and wants to know what to do. Dr.Gessner pt.

## 2017-09-24 NOTE — Progress Notes (Signed)
I called the patient and he will stay on liquids # 15 Vicodin Rx He will update by My Chart or phone daily 6 MP is dced

## 2017-09-25 ENCOUNTER — Encounter: Payer: Self-pay | Admitting: Family Medicine

## 2017-09-27 ENCOUNTER — Telehealth: Payer: Self-pay | Admitting: Internal Medicine

## 2017-09-27 NOTE — Telephone Encounter (Signed)
Patient reports that he is due Humra tomorrow.  His pancreatitis pain has resolved. He just has a slight tenderness in epigastric area, no fever, and no nausea .  He feels a lot better.  Is it ok to take Humira tomorrow?

## 2017-09-27 NOTE — Telephone Encounter (Signed)
Patient states Dr.Gessner diagnosed him with proctitis recently and wants to make sure he can still have his humira injection tomorrow.

## 2017-09-28 NOTE — Telephone Encounter (Signed)
Yes Take humira

## 2017-09-28 NOTE — Telephone Encounter (Signed)
Patient notified

## 2017-10-08 ENCOUNTER — Encounter: Payer: Self-pay | Admitting: Internal Medicine

## 2017-10-08 ENCOUNTER — Ambulatory Visit: Payer: 59 | Admitting: Internal Medicine

## 2017-10-08 DIAGNOSIS — K50018 Crohn's disease of small intestine with other complication: Secondary | ICD-10-CM

## 2017-10-08 DIAGNOSIS — K853 Drug induced acute pancreatitis without necrosis or infection: Secondary | ICD-10-CM

## 2017-10-08 DIAGNOSIS — Z79899 Other long term (current) drug therapy: Secondary | ICD-10-CM | POA: Diagnosis not present

## 2017-10-08 DIAGNOSIS — Z7960 Long term (current) use of unspecified immunomodulators and immunosuppressants: Secondary | ICD-10-CM

## 2017-10-08 NOTE — Assessment & Plan Note (Signed)
6-MP stopped due to pancreatitis.  He will continue on Humira.  Will review protective immunizations on follow-up.

## 2017-10-08 NOTE — Progress Notes (Signed)
[name] 66 y.o. 1951/06/28 [id number]  Assessment & Plan:   Crohn's ileitis [facility])  Add Imodium prn See me in late June vs July Call back if worse or if all ok then will stop budesonide  Drug-induced acute pancreatitis without infection or necrosis - from 6 MP Seems resolved, medication added to allergy/intolerance list.  Long-term use of immunosuppressant medication  Humira 6-MP stopped due to pancreatitis.  He will continue on Humira.  Will review protective immunizations on follow-up.    I appreciate the opportunity to care for this patient. CC: Justin Salisbury, MD  Subjective:   Chief Complaint: Follow-up of Crohn's disease  HPI [name] is here for follow-up of his Crohn's disease of the ileum, he has been treated with budesonide and then I started 6-MP along with Humira which she is just getting started.  He was in the office on March 8 with fairly significant periumbilical pain which turned out to be pancreatitis from his 6-MP diagnosed by lab testing.  Within a day of stopping 6-MP he noted about a 90% improvement.  He was concerned that it might of been another bowel obstruction that and he would need surgery but fortunately it was not.  He is having several soft loose-ish stools a day and mild abdominal pain.  He is better overall but not in remission by any stretch.  I think he has had Humira twice at this point. Allergies  Allergen Reactions  . Shellfish Allergy     Throat stated to close   Current Meds  Medication Sig  . albuterol (PROVENTIL HFA;VENTOLIN HFA) 108 (90 Base) MCG/ACT inhaler Inhale 2 puffs into the lungs every 4 (four) hours as needed for wheezing or shortness of breath.  . budesonide (ENTOCORT EC) 3 MG 24 hr capsule Take 3 capsules (9 mg total) by mouth daily.  . halobetasol (ULTRAVATE) 0.05 % ointment Apply topically 2 (two) times daily.  Marland Kitchen HYDROcodone-acetaminophen (NORCO/VICODIN) 5-325 MG tablet Take 1 tablet by mouth every 4 (four) hours as  needed for moderate pain. May take 2 for severe pain  . hyoscyamine (LEVSIN SL) 0.125 MG SL tablet PLACE 1 TABLET UNDER THE TONGUE EVERY 4 HOURS AS NEEDED FOR CRAMPING (URGENT DEFECATION)  . Multiple Vitamin (MULTIVITAMIN WITH MINERALS) TABS Take 1 tablet by mouth daily.  . traZODone (DESYREL) 100 MG tablet Take 1 tablet (100 mg total) by mouth at bedtime.   Past Medical History:  Diagnosis Date  . Acute prostatitis 07/24/2007   Qualifier: Diagnosis of  By: Clent Ridges MD, Tera Mater   . Allergy    mild  . Arthritis    osteoarthritis  . Asthma   . Blood transfusion without reported diagnosis   . BPH (benign prostatic hypertrophy) with urinary obstruction   . Crohn's ileitis ([facility]) suspected 05/03/2017  . Dilated aortic root ([facility])    noted on echo 08/2012  . Diverticulitis of colon   . EPIDIDYMITIS 02/15/2010   Qualifier: Diagnosis of  By: Clent Ridges MD, Tera Mater   . GERD (gastroesophageal reflux disease)   . H/O: GI bleed   . Hemorrhoids   . Hepatitis 1975   unknown type   . HERPES SIMPLEX INFECTION 10/14/2007   Qualifier: Diagnosis of  By: Clent Ridges MD, Tera Mater   . Hiatal hernia   . Ileus following gastrointestinal surgery 12/26/2011  . Psoriasis    sees Dr. Karene Fry at [facility].  . Recurrent ventral incisional hernia 05/10/2012  . SVT (supraventricular tachycardia) ([facility])   .  Ulcer 08/21/2013   ileal   Past Surgical History:  Procedure Laterality Date  . BOWEL RESECTION  12/19/2011   Procedure: SMALL BOWEL RESECTION;  Surgeon: Mariella Saa, MD;  Location: [facility];  Service: General;  Laterality: N/A;  with anastamosis and insertion mesh  . BRONCHOSCOPY    . COLON SURGERY  01/2004  . COLONOSCOPY    . COLONOSCOPY W/ BIOPSIES  08/21/2013  . CYSTOSCOPY    . ESOPHAGOGASTRODUODENOSCOPY    . HEMICOLECTOMY     left side, at [facility], diverticulitis  . HEMORRHOID BANDING    . HERNIA REPAIR     [phone] incisional hernia  . ILEOSTOMY    . ILEOSTOMY CLOSURE    . INSERTION OF MESH  07/31/2012    Procedure: INSERTION OF MESH;  Surgeon: Mariella Saa, MD;  [facility];  Service: General;;  . LAPAROTOMY  12/19/2011   Procedure: EXPLORATORY LAPAROTOMY;  Surgeon: Mariella Saa, MD;  [facility];  Service: General;  Laterality: N/A;  . TONSILLECTOMY    . UPPER GASTROINTESTINAL ENDOSCOPY    . VASECTOMY    . 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   Review of Systems As per HPI  Objective:   Physical Exam BP 124/68   Pulse 72   Ht 5\' 10"  (1.778 m)   Wt 225 lb 2 oz (102.1 kg)   BMI 32.30 kg/m   No acute distress Eyes anicteric Abdomen is soft nontender no organomegaly mass well-healed surgical scars bowel sounds present

## 2017-10-08 NOTE — Assessment & Plan Note (Addendum)
Seems resolved, medication added to allergy/intolerance list.

## 2017-10-08 NOTE — Patient Instructions (Signed)
You may take up Imodium , up to 6 a day.   Come back and see Dr Leone Payor in late June.   Call us back if your worse .  If okay may be able to stop budesonide.    I appreciate the opportunity to care for you. Stan Head, MD, [facility]

## 2017-10-08 NOTE — Assessment & Plan Note (Signed)
Add Imodium prn See me in late June vs July Call back if worse or if all ok then will stop budesonide

## 2017-10-09 ENCOUNTER — Other Ambulatory Visit: Payer: Self-pay

## 2017-10-09 MED ORDER — BUDESONIDE 3 MG PO CPEP
9.0000 mg | ORAL_CAPSULE | Freq: Every day | ORAL | 1 refills | Status: DC
Start: 2017-10-09 — End: 2018-05-06

## 2017-10-09 NOTE — Telephone Encounter (Signed)
Per Dr Leone Payor okay to refill patients budesonide.  Seen yesterday.

## 2017-10-18 ENCOUNTER — Other Ambulatory Visit: Payer: Self-pay | Admitting: Internal Medicine

## 2017-10-19 NOTE — Telephone Encounter (Signed)
How many refills Sir? 

## 2017-10-19 NOTE — Telephone Encounter (Signed)
3

## 2017-11-14 ENCOUNTER — Other Ambulatory Visit: Payer: Self-pay | Admitting: Family Medicine

## 2017-11-14 NOTE — Telephone Encounter (Signed)
Last OV 09/25/2017   Last refilled 11/26/2015 disp 10 with no refills   Sent to PCP for approval

## 2017-11-23 ENCOUNTER — Ambulatory Visit: Payer: 59 | Admitting: Family Medicine

## 2017-11-23 ENCOUNTER — Encounter: Payer: Self-pay | Admitting: Family Medicine

## 2017-11-23 VITALS — BP 112/70 | HR 58 | Temp 97.7°F | Ht 70.0 in | Wt 225.8 lb

## 2017-11-23 DIAGNOSIS — L989 Disorder of the skin and subcutaneous tissue, unspecified: Secondary | ICD-10-CM

## 2017-11-23 MED ORDER — TADALAFIL 5 MG PO TABS: 5.0000 mg | ORAL_TABLET | Freq: Every day | ORAL | 3 refills | Status: DC

## 2017-11-23 NOTE — Progress Notes (Signed)
   Subjective:    Patient ID: [name], male    DOB: 1951/06/25, 67 y.o.   MRN: [id number]  HPI Here to check an itchy red spot in the back that he noticed in the mirror a week ago. He is concerned because he has been using Humira for the past 2 months.    Review of Systems  Constitutional: Negative.   Respiratory: Negative.   Cardiovascular: Negative.   Skin: Positive for color change.       Objective:   Physical Exam  Constitutional: He appears well-developed and well-nourished.  Cardiovascular: Normal rate, regular rhythm, normal heart sounds and intact distal pulses.  Pulmonary/Chest: Effort normal and breath sounds normal.  Skin:  The are on the upper back between the scapulae has a 1 cm macular red scaly lesion with irregular borders           Assessment & Plan:  This is worrisome for a squamous cell cancer or at least an actinic lesion. We will [facility] to [facility].  Gershon Crane, MD

## 2017-11-27 ENCOUNTER — Telehealth: Payer: Self-pay | Admitting: Family Medicine

## 2017-11-27 NOTE — Telephone Encounter (Signed)
Copied from CRM [id number]. Topic: Referral - Question >> Nov 27, 2017  3:02 PM Rasheen, Nicklaus C wrote: Reason for CRM: pt [facility] scheduled [facility] until October, pt would like to know if office could get him set up with a different location. Pt says that he feels that provider would like for him to be seen sooner than October.

## 2017-11-28 ENCOUNTER — Ambulatory Visit: Payer: 59 | Admitting: Psychology

## 2017-11-28 NOTE — Telephone Encounter (Signed)
Please see if another dermatology office can see him sooner than that

## 2017-11-28 NOTE — Telephone Encounter (Signed)
Please see message.  Please advise. 

## 2017-11-29 ENCOUNTER — Telehealth: Payer: Self-pay | Admitting: Family Medicine

## 2017-11-29 NOTE — Telephone Encounter (Signed)
Copied from [facility] [id number]. Topic: Referral - Status >> Nov 29, 2017  3:58 PM Neva Seat wrote: Pt calling back.  Have not heard from dermatologist referral office. Please call pt back to let him know of the status.   >> Nov 29, 2017  4:11 PM Margot Ables wrote: Pt states that the [facility] states they have not received a referral. Please resend.   Pt also states they informed him if he hasn't had a biopsy on the site they would not be able to see him until November. If he has a biopsy that is confirmed he would be able to be scheduled for surgery sooner.   Please call pt at [phone]

## 2017-11-30 ENCOUNTER — Other Ambulatory Visit: Payer: Self-pay | Admitting: Internal Medicine

## 2017-11-30 ENCOUNTER — Encounter: Payer: Self-pay | Admitting: Family Medicine

## 2017-11-30 ENCOUNTER — Telehealth: Payer: Self-pay | Admitting: Family Medicine

## 2017-11-30 NOTE — Telephone Encounter (Signed)
Spoke w/ patient and informed him referral was placed and notes were faxed on 11/27/17. He states he already has appointment scheduled but talked to the [facility] today and they said they do not have the referral or the notes. His appointment is scheduled for 12/12/17.  Please resend. Thanks!

## 2017-11-30 NOTE — Telephone Encounter (Signed)
Copied from CRM [id number]. Topic: Referral - Status >> Nov 29, 2017  3:58 PM Raquel Sarna wrote: Pt calling back.  Have not heard from dermatologist referral office. Please call pt back to let him know of the status.   >> Nov 29, 2017  4:11 PM Maia Petties wrote: Pt states that the [facility] states they have not received a referral. Please resend.   Pt also states they informed him if he hasn't had a biopsy on the site they would not be able to see him until November. If he has a biopsy that is confirmed he would be able to be scheduled for surgery sooner.   Please call pt at [phone] >> Nov 30, 2017 10:30 AM Rudi Coco, NT wrote: Pt. Calling back needing to speak with someone in referrals Today

## 2017-12-04 NOTE — Telephone Encounter (Signed)
Notes resent to [facility].

## 2018-01-02 ENCOUNTER — Telehealth: Payer: Self-pay | Admitting: *Deleted

## 2018-01-02 NOTE — Telephone Encounter (Signed)
PA for tadalafil 5 mg tablets initiated via CoverMyMeds. Awaiting determination. Key: [id number]

## 2018-01-04 NOTE — Telephone Encounter (Signed)
Yes I do. He does have BPH, as noted in his chart. Please resubmit and use BPH as a diagnosis instead of ED. Thanks

## 2018-01-04 NOTE — Telephone Encounter (Signed)
PA denied.   Cialis 2.5 mg and 5 mg criteria covers this drug when you have benign prostatic hyperplasia (BPH) that is causing symptoms.   Do you want to appeal decision?

## 2018-01-08 ENCOUNTER — Encounter: Payer: Self-pay | Admitting: Family Medicine

## 2018-01-08 ENCOUNTER — Encounter: Payer: Self-pay | Admitting: *Deleted

## 2018-01-08 ENCOUNTER — Ambulatory Visit (INDEPENDENT_AMBULATORY_CARE_PROVIDER_SITE_OTHER): Payer: 59 | Admitting: Family Medicine

## 2018-01-08 VITALS — BP 120/62 | HR 51 | Temp 97.6°F | Ht 70.5 in | Wt 225.0 lb

## 2018-01-08 DIAGNOSIS — Z0000 Encounter for general adult medical examination without abnormal findings: Secondary | ICD-10-CM | POA: Diagnosis not present

## 2018-01-08 DIAGNOSIS — M791 Myalgia, unspecified site: Secondary | ICD-10-CM | POA: Diagnosis not present

## 2018-01-08 LAB — POC URINALSYSI DIPSTICK (AUTOMATED)
Bilirubin, UA: POSITIVE
Blood, UA: NEGATIVE
Glucose, UA: NEGATIVE
KETONES UA: NEGATIVE
LEUKOCYTES UA: NEGATIVE
Nitrite, UA: NEGATIVE
PH UA: 6 (ref 5.0–8.0)
PROTEIN UA: POSITIVE — AB
Spec Grav, UA: 1.025 (ref 1.010–1.025)
UROBILINOGEN UA: 0.2 U/dL

## 2018-01-08 MED ORDER — HALOBETASOL PROPIONATE 0.05 % EX OINT: TOPICAL_OINTMENT | Freq: Two times a day (BID) | CUTANEOUS | 5 refills | Status: DC

## 2018-01-08 MED ORDER — KETOCONAZOLE 2 % EX CREA: 1.0000 "application " | TOPICAL_CREAM | Freq: Two times a day (BID) | CUTANEOUS | 2 refills | Status: DC | PRN

## 2018-01-08 NOTE — Telephone Encounter (Signed)
PA approved 01/08/2018-01/08/2021.

## 2018-01-08 NOTE — Telephone Encounter (Signed)
Appeal letter faxed to [facility]. Fax confirmation received.

## 2018-01-08 NOTE — Progress Notes (Signed)
   Subjective:    Patient ID: [name], male    DOB: 1951-07-07, 67 y.o.   MRN: [id number]  HPI Here for a well exam. His only complaint is mild diffuse muscle aches. This started about a month ago. He asks if this could be a side effect the Humira he is taking.    Review of Systems  Constitutional: Negative.   HENT: Negative.   Eyes: Negative.   Respiratory: Negative.   Cardiovascular: Negative.   Gastrointestinal: Negative.   Genitourinary: Negative.   Musculoskeletal: Positive for myalgias.  Skin: Negative.   Neurological: Negative.   Psychiatric/Behavioral: Negative.        Objective:   Physical Exam  Constitutional: He is oriented to person, place, and time. He appears well-developed and well-nourished. No distress.  HENT:  Head: Normocephalic and atraumatic.  Right Ear: External ear normal.  Left Ear: External ear normal.  Nose: Nose normal.  Mouth/Throat: Oropharynx is clear and moist. No oropharyngeal exudate.  Eyes: Pupils are equal, round, and reactive to light. Conjunctivae and EOM are normal. Right eye exhibits no discharge. Left eye exhibits no discharge. No scleral icterus.  Neck: Neck supple. No JVD present. No tracheal deviation present. No thyromegaly present.  Cardiovascular: Normal rate, regular rhythm, normal heart sounds and intact distal pulses. Exam reveals no gallop and no friction rub.  No murmur heard. Pulmonary/Chest: Effort normal and breath sounds normal. No respiratory distress. He has no wheezes. He has no rales. He exhibits no tenderness.  Abdominal: Soft. Bowel sounds are normal. He exhibits no distension and no mass. There is no tenderness. There is no rebound and no guarding.  Genitourinary: Rectum normal, prostate normal and penis normal. Rectal exam shows guaiac negative stool. No penile tenderness.  Musculoskeletal: Normal range of motion. He exhibits no edema or tenderness.  Lymphadenopathy:    He has no cervical adenopathy.    Neurological: He is alert and oriented to person, place, and time. He has normal reflexes. He displays normal reflexes. No cranial nerve deficit. He exhibits normal muscle tone. Coordination normal.  Skin: Skin is warm and dry. No rash noted. He is not diaphoretic. No erythema. No pallor.  Psychiatric: He has a normal mood and affect. His behavior is normal. Judgment and thought content normal.          Assessment & Plan:  Well exam. We discussed diet and exercise. Get fasting labs. As for the myalgias, certainly this could be an effect of the Humira. We will screen with a CK today. He will discuss this with Dr. Nickola Major soon.  Gershon Crane, MD

## 2018-01-09 LAB — PSA: PSA: 1.63 ng/mL (ref 0.10–4.00)

## 2018-01-09 LAB — HEPATIC FUNCTION PANEL
ALBUMIN: 4.5 g/dL (ref 3.5–5.2)
ALK PHOS: 61 U/L (ref 39–117)
ALT: 18 U/L (ref 0–53)
AST: 35 U/L (ref 0–37)
Bilirubin, Direct: 0.2 mg/dL (ref 0.0–0.3)
TOTAL PROTEIN: 7.2 g/dL (ref 6.0–8.3)
Total Bilirubin: 0.6 mg/dL (ref 0.2–1.2)

## 2018-01-09 LAB — TSH: TSH: 2.11 u[IU]/mL (ref 0.35–4.50)

## 2018-01-09 LAB — BASIC METABOLIC PANEL
BUN: 13 mg/dL (ref 6–23)
CHLORIDE: 103 meq/L (ref 96–112)
CO2: 26 mEq/L (ref 19–32)
Calcium: 9.5 mg/dL (ref 8.4–10.5)
Creatinine, Ser: 0.98 mg/dL (ref 0.40–1.50)
GFR: 81.59 mL/min (ref >60.00)
GLUCOSE: 81 mg/dL (ref 70–99)
POTASSIUM: 4.2 meq/L (ref 3.5–5.1)
Sodium: 141 mEq/L (ref 135–145)

## 2018-01-09 LAB — CBC WITH DIFFERENTIAL/PLATELET
BASOS ABS: 0.1 10*3/uL (ref 0.0–0.1)
Basophils Relative: 1.2 % (ref 0.0–3.0)
EOS ABS: 0.2 10*3/uL (ref 0.0–0.7)
Eosinophils Relative: 1.7 % (ref 0.0–5.0)
HCT: 48.5 % (ref 39.0–52.0)
Hemoglobin: 16.4 g/dL (ref 13.0–17.0)
LYMPHS ABS: 2.5 10*3/uL (ref 0.7–4.0)
Lymphocytes Relative: 28.6 % (ref 12.0–46.0)
MCHC: 33.8 g/dL (ref 30.0–36.0)
MCV: 88.9 fl (ref 78.0–100.0)
MONO ABS: 0.6 10*3/uL (ref 0.1–1.0)
Monocytes Relative: 7.4 % (ref 3.0–12.0)
NEUTROS ABS: 5.3 10*3/uL (ref 1.4–7.7)
NEUTROS PCT: 61.1 % (ref 43.0–77.0)
Platelets: 216 10*3/uL (ref 150.0–400.0)
RBC: 5.45 Mil/uL (ref 4.22–5.81)
RDW: 13.5 % (ref 11.5–15.5)
WBC: 8.7 10*3/uL (ref 4.0–10.5)

## 2018-01-09 LAB — LIPID PANEL
CHOL/HDL RATIO: 5
CHOLESTEROL: 142 mg/dL (ref 0–200)
HDL: 26.7 mg/dL — ABNORMAL LOW (ref >39.00)
Triglycerides: 460 mg/dL — ABNORMAL HIGH (ref 0.0–149.0)

## 2018-01-09 LAB — CK: CK TOTAL: 47 U/L (ref 7–232)

## 2018-01-09 LAB — LDL CHOLESTEROL, DIRECT: Direct LDL: 71 mg/dL

## 2018-03-04 ENCOUNTER — Telehealth: Payer: Self-pay | Admitting: Internal Medicine

## 2018-03-04 DIAGNOSIS — R197 Diarrhea, unspecified: Secondary | ICD-10-CM

## 2018-03-04 DIAGNOSIS — K50018 Crohn's disease of small intestine with other complication: Secondary | ICD-10-CM

## 2018-03-04 NOTE — Telephone Encounter (Signed)
Patient reports Crohn's flare for the last several weeks.  Has upper abdominal pain, diarrhea, urgency daily. Stools start solid in the am then progress to diarrhea as the day goes on. Number of stools a day vary, but multiple a day.  He denies bleeding, fever, nausea or vomiting . No sick contacts or travel.   There are no APP or appts with you in the near future.  He feels the Humira may have lost it's efficacy. Please advise

## 2018-03-04 NOTE — Telephone Encounter (Signed)
Pt states that humira has not been working and he has been having sxs since a month ago, he is requesting to see [name] but nothing available until October.

## 2018-03-06 NOTE — Telephone Encounter (Signed)
I spoke to him  I have ordered the following:  1) CBC, CMET CRP 2) Stoo C diff 3) Adalimumab abs  He will restart budesonide and take loperamide  I will arrange f/u after the above

## 2018-03-08 ENCOUNTER — Other Ambulatory Visit (INDEPENDENT_AMBULATORY_CARE_PROVIDER_SITE_OTHER): Payer: 59

## 2018-03-08 DIAGNOSIS — K50018 Crohn's disease of small intestine with other complication: Secondary | ICD-10-CM | POA: Diagnosis not present

## 2018-03-08 DIAGNOSIS — R197 Diarrhea, unspecified: Secondary | ICD-10-CM

## 2018-03-08 LAB — CBC WITH DIFFERENTIAL/PLATELET
Basophils Absolute: 0 10*3/uL (ref 0.0–0.1)
Basophils Relative: 0.3 % (ref 0.0–3.0)
Eosinophils Absolute: 0.1 10*3/uL (ref 0.0–0.7)
Eosinophils Relative: 1.2 % (ref 0.0–5.0)
HCT: 46.2 % (ref 39.0–52.0)
HEMOGLOBIN: 15.6 g/dL (ref 13.0–17.0)
Lymphocytes Relative: 29.5 % (ref 12.0–46.0)
Lymphs Abs: 2.8 10*3/uL (ref 0.7–4.0)
MCHC: 33.8 g/dL (ref 30.0–36.0)
MCV: 86.9 fl (ref 78.0–100.0)
MONO ABS: 0.7 10*3/uL (ref 0.1–1.0)
Monocytes Relative: 7.4 % (ref 3.0–12.0)
NEUTROS ABS: 5.8 10*3/uL (ref 1.4–7.7)
Neutrophils Relative %: 61.6 % (ref 43.0–77.0)
Platelets: 197 10*3/uL (ref 150.0–400.0)
RBC: 5.32 Mil/uL (ref 4.22–5.81)
RDW: 14.2 % (ref 11.5–15.5)
WBC: 9.4 10*3/uL (ref 4.0–10.5)

## 2018-03-08 LAB — COMPREHENSIVE METABOLIC PANEL
ALBUMIN: 4.2 g/dL (ref 3.5–5.2)
ALT: 16 U/L (ref 0–53)
AST: 27 U/L (ref 0–37)
Alkaline Phosphatase: 56 U/L (ref 39–117)
BUN: 12 mg/dL (ref 6–23)
CHLORIDE: 104 meq/L (ref 96–112)
CO2: 24 meq/L (ref 19–32)
Calcium: 9.5 mg/dL (ref 8.4–10.5)
Creatinine, Ser: 1 mg/dL (ref 0.40–1.50)
GFR: 79.67 mL/min (ref >60.00)
GLUCOSE: 92 mg/dL (ref 70–99)
POTASSIUM: 3.8 meq/L (ref 3.5–5.1)
SODIUM: 139 meq/L (ref 135–145)
Total Bilirubin: 0.5 mg/dL (ref 0.2–1.2)
Total Protein: 7.1 g/dL (ref 6.0–8.3)

## 2018-03-08 LAB — C-REACTIVE PROTEIN: CRP: 0.7 mg/dL (ref 0.5–20.0)

## 2018-03-10 NOTE — Progress Notes (Signed)
Labs ok My Chart

## 2018-03-15 ENCOUNTER — Other Ambulatory Visit: Payer: Self-pay

## 2018-03-15 LAB — ADALIMUMAB+AB (SERIAL MONITOR)
Adalimumab Drug Level: <0.6 ug/mL
Anti-Adalimumab Antibody: 2010 ng/mL

## 2018-03-15 LAB — SERIAL MONITORING

## 2018-03-15 NOTE — Progress Notes (Signed)
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 Or concerns otherwise let me know or he can also send a My Chart message

## 2018-05-06 ENCOUNTER — Encounter: Payer: Self-pay | Admitting: Internal Medicine

## 2018-05-06 ENCOUNTER — Other Ambulatory Visit (INDEPENDENT_AMBULATORY_CARE_PROVIDER_SITE_OTHER): Payer: 59

## 2018-05-06 ENCOUNTER — Ambulatory Visit: Payer: 59 | Admitting: Internal Medicine

## 2018-05-06 VITALS — BP 140/76 | HR 72 | Ht 70.5 in | Wt 229.2 lb

## 2018-05-06 DIAGNOSIS — K50018 Crohn's disease of small intestine with other complication: Secondary | ICD-10-CM

## 2018-05-06 DIAGNOSIS — Z79899 Other long term (current) drug therapy: Secondary | ICD-10-CM

## 2018-05-06 DIAGNOSIS — Z7960 Long term (current) use of unspecified immunomodulators and immunosuppressants: Secondary | ICD-10-CM

## 2018-05-06 LAB — CBC WITH DIFFERENTIAL/PLATELET
BASOS PCT: 0.7 % (ref 0.0–3.0)
Basophils Absolute: 0.1 10*3/uL (ref 0.0–0.1)
EOS ABS: 0.1 10*3/uL (ref 0.0–0.7)
EOS PCT: 0.5 % (ref 0.0–5.0)
HCT: 45.8 % (ref 39.0–52.0)
HEMOGLOBIN: 15.3 g/dL (ref 13.0–17.0)
LYMPHS ABS: 2.1 10*3/uL (ref 0.7–4.0)
Lymphocytes Relative: 20.2 % (ref 12.0–46.0)
MCHC: 33.3 g/dL (ref 30.0–36.0)
MCV: 87.7 fl (ref 78.0–100.0)
MONO ABS: 0.6 10*3/uL (ref 0.1–1.0)
Monocytes Relative: 6.2 % (ref 3.0–12.0)
NEUTROS ABS: 7.5 10*3/uL (ref 1.4–7.7)
NEUTROS PCT: 72.4 % (ref 43.0–77.0)
PLATELETS: 224 10*3/uL (ref 150.0–400.0)
RBC: 5.23 Mil/uL (ref 4.22–5.81)
RDW: 14 % (ref 11.5–15.5)
WBC: 10.3 10*3/uL (ref 4.0–10.5)

## 2018-05-06 MED ORDER — DIPHENOXYLATE-ATROPINE 2.5-0.025 MG PO TABS: ORAL_TABLET | ORAL | 1 refills | Status: DC

## 2018-05-06 MED ORDER — PREDNISONE 10 MG PO TABS: 40.0000 mg | ORAL_TABLET | Freq: Every day | ORAL | 1 refills | Status: DC

## 2018-05-06 NOTE — Progress Notes (Signed)
[name] 67 y.o. [birth year] [id number]  Assessment & Plan:  Crohn's ileitis (HCC)  DC budesonide as it is not controlling things Start prednisone taper Lomotil prn for symptom relief Prescribe Entyvio since he has failed Humira with lack of response and high levels of antibodies RTC 8 weeks   Long-term use of immunosuppressant medication   Switching to Entyvio depending upon insurance   Plan to prescribe Entyvio, I reviewed risks benefits and indications of Biologics which he had been through before and he accepts the risks   QuantiFERON TB test  and CBC today  Lab Results  Component Value Date   WBC 10.3 05/06/2018   HGB 15.3 05/06/2018   HCT 45.8 05/06/2018   MCV 87.7 05/06/2018   PLT 224.0 05/06/2018   CC: Justin Salisbury, MD Dr. Phillips Callahan  Subjective:   Chief Complaint: Crohn's ileitis, needs new treatment  HPI [name] is here with complaints of diarrhea in the setting of Crohn's ileitis.  This was diagnosed late last year, he was treated with budesonide, he actually seemed to respond to that he was placed on Humira and 6-MP and unfortunately got pancreatitis from the 6-MP he was improving some and then started having symptoms of diarrhea and problems again.  Antibody levels with adalimumab antibodies were greater than 2000 and his trough level was less than 0.6.  This medication was stopped.  He is currently on 9 mg budesonide daily and is having up to 6 loose stools a day using some Imodium with partial relief.  No bleeding.  Outside of the diarrhea he says he feels better overall less achy and fatigue etc. now that he is off Humira Allergies  Allergen Reactions  . Mercaptopurine Other (See Comments)    Pancreatitis   . Shellfish Allergy     Throat stated to close   Current Meds  Medication Sig  . albuterol (PROVENTIL HFA;VENTOLIN HFA) 108 (90 Base) MCG/ACT inhaler Inhale 2 puffs into the lungs every 4 (four) hours as needed for wheezing or shortness of  breath.  . halobetasol (ULTRAVATE) 0.05 % ointment Apply topically 2 (two) times daily.  . hyoscyamine (LEVSIN SL) 0.125 MG SL tablet PLACE 1 TABLET UNDER THE TONGUE EVERY 4 HOURS AS NEEDED FOR CRAMPING (URGENT DEFECATION)  . ketoconazole (NIZORAL) 2 % cream Apply 1 application topically 2 (two) times daily as needed (fungal infections).  . Multiple Vitamin (MULTIVITAMIN WITH MINERALS) TABS Take 1 tablet by mouth daily.  . tadalafil (CIALIS) 5 MG tablet Take 1 tablet (5 mg total) by mouth daily.  . traZODone (DESYREL) 100 MG tablet Take 1 tablet (100 mg total) by mouth at bedtime.  . [DISCONTINUED] budesonide (ENTOCORT EC) 3 MG 24 hr capsule Take 3 capsules (9 mg total) by mouth daily.   Past Medical History:  Diagnosis Date  . Acute prostatitis 07/24/2007   Qualifier: Diagnosis of  By: Clent Ridges MD, Tera Mater   . Allergy    mild  . Arthritis    osteoarthritis  . Asthma   . Blood transfusion without reported diagnosis   . BPH (benign prostatic hypertrophy) with urinary obstruction   . Crohn's ileitis (HCC) suspected 05/03/2017  . Dilated aortic root (HCC)    noted on echo 08/2012  . Diverticulitis of colon   . EPIDIDYMITIS 02/15/2010   Qualifier: Diagnosis of  By: Clent Ridges MD, Tera Mater   . GERD (gastroesophageal reflux disease)   . H/O: GI bleed   . Hemorrhoids   . Hepatitis 1975  unknown type   . HERPES SIMPLEX INFECTION 10/14/2007   Qualifier: Diagnosis of  By: Clent Ridges MD, Tera Mater   . Hiatal hernia   . Ileus following gastrointestinal surgery (HCC) 12/26/2011  . Psoriasis    sees Dr. Karene Fry at [facility].  . Recurrent ventral incisional hernia 05/10/2012  . SVT (supraventricular tachycardia) (HCC)   . Ulcer 08/21/2013   ileal   Past Surgical History:  Procedure Laterality Date  . BOWEL RESECTION  12/19/2011   Procedure: SMALL BOWEL RESECTION;  Surgeon: Mariella Saa, MD;  Location: [facility];  Service: General;  Laterality: N/A;  with anastamosis and insertion mesh  .  BRONCHOSCOPY    . COLON SURGERY  01/2004  . COLONOSCOPY W/ BIOPSIES  04/26/2017   per Dr. Leone Payor, no polyps, benign inflammation, repeat in 5 yrs   . CYSTOSCOPY    . ESOPHAGOGASTRODUODENOSCOPY    . HEMICOLECTOMY     left side, at [facility], diverticulitis  . HEMORRHOID BANDING    . HERNIA REPAIR     [phone] incisional hernia  . ILEOSTOMY    . ILEOSTOMY CLOSURE    . INSERTION OF MESH  07/31/2012   Procedure: INSERTION OF MESH;  Surgeon: Mariella Saa, MD;  Location: [facility];  Service: General;;  . LAPAROTOMY  12/19/2011   Procedure: EXPLORATORY LAPAROTOMY;  Surgeon: Mariella Saa, MD;  Location: [facility];  Service: General;  Laterality: N/A;  . TONSILLECTOMY    . UPPER GASTROINTESTINAL ENDOSCOPY    . VASECTOMY    . 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   Rare if any caffeine   family history includes Heart attack in his father; Heart disease in his father; Hypertension in his father; Leukemia in his father; Lung cancer in his mother; Prostate cancer in his father and paternal uncle.   Review of Systems As per HPI  Objective:   Physical Exam @BP  140/76 (BP Location: Left Arm, Patient Position: Sitting, Cuff Size: Normal)   Pulse 72   Ht 5' 10.5" (1.791 m) Comment: height measured without shoes  Wt 229 lb 4 oz (104 kg)   BMI 32.43 kg/m @  General:  NAD Eyes:   anicteric Lungs:  clear Heart::  S1S2 no rubs, murmurs or gallops Abdomen:  soft and nontender, BS+ Ext:   no edema, cyanosis or clubbing    Data Reviewed:  As per HPI

## 2018-05-06 NOTE — Assessment & Plan Note (Addendum)
DC budesonide as it is not controlling things Start prednisone taper Lomotil prn for symptom relief Prescribe Entyvio since he has failed Humira with lack of response and high levels of antibodies RTC 8 weeks

## 2018-05-06 NOTE — Patient Instructions (Addendum)
Your provider has requested that you go to the basement level for lab work before leaving today. Press "B" on the elevator. The lab is located at the first door on the left as you exit the elevator.   We have sent the following medications to your pharmacy for you to pick up at your convenience: Prednisone, lomotil has been faxed into the pharmacy.   Update [place] in a week either by phone or Mychart with how your doing so Dr Leone Payor can advise how to taper your prednisone.   Stop your Budesonide.   We will work on getting you set up for [facility].   Please follow up with [place]     I appreciate the opportunity to care for you. Stan Head, MD, [facility]

## 2018-05-07 ENCOUNTER — Other Ambulatory Visit: Payer: Self-pay | Admitting: Internal Medicine

## 2018-05-08 LAB — QUANTIFERON-TB GOLD PLUS
Mitogen-NIL: >10 IU/mL
NIL: 0.04 IU/mL
QuantiFERON-TB Gold Plus: NEGATIVE
TB1-NIL: 0 IU/mL
TB2-NIL: 0 IU/mL

## 2018-05-08 LAB — HEPATITIS B SURFACE ANTIGEN: HEP B S AG: NONREACTIVE

## 2018-05-08 NOTE — Assessment & Plan Note (Addendum)
Switching to Entyvio depending upon insurance Checking QuantiFERON today

## 2018-05-09 ENCOUNTER — Telehealth: Payer: Self-pay

## 2018-05-09 NOTE — Telephone Encounter (Signed)
Crohn's ileitis [facility])  Edit Overview Unprioritized 05/08/2018  Iva Boop, MD     Details Code: K50.00 Noted: 05/03/2017 Share w/ Pt: [x]  Change Dx Ma Hillock     Overview Edited: Iva Boop, MD 05/08/2018 04/2017 colonoscopy - Budesonide started and had a good response. Transition to 6-MP plus Humira early 2019 developed pancreatitis from 6-MP. Budesonide restarted and titrating upward on Humira. Humira antibodies very high September 2019 loss of efficacy Restart prednisone as budesonide not helping, plan to switch to [facility]

## 2018-05-16 ENCOUNTER — Telehealth: Payer: Self-pay

## 2018-05-16 NOTE — Telephone Encounter (Signed)
I have messaged him that I am ok with Cimzia and that you will contact him about the process and Rx

## 2018-05-16 NOTE — Telephone Encounter (Signed)
Patient's insurance has Entyvio as a second line drug.  He must try and fail Cimzia first.  Please advise

## 2018-05-20 NOTE — Telephone Encounter (Signed)
Referral submitted to [facility].  Patient notified via MyChart.  He is aware I will call him once I hear back.

## 2018-05-30 NOTE — Telephone Encounter (Signed)
According to [facility] he had to try and fail Cimzia first.  Cimzia was denied as well per [facility].  Stelara is the next step.  Is his okay?

## 2018-05-31 NOTE — Telephone Encounter (Signed)
See notes from patient.  Did you see question about Stelara?

## 2018-06-05 ENCOUNTER — Telehealth: Payer: Self-pay

## 2018-06-05 NOTE — Telephone Encounter (Signed)
Discussed with Dr. Leone Payor that Thompson Grayer and Richardo Priest have been denied.  According to [facility] pharmacy patient must try Stelara.  Discussed with Dr. Leone Payor.  Start Stelara 520 mg IV x1 then 90 mg sq q 8 weeks.  New orders faxed back to [facility].  They will run the benefits for both the IV one time infusion and the sq maintenance dosage.  I left a message for the patient to call back and discuss.

## 2018-06-07 ENCOUNTER — Ambulatory Visit (INDEPENDENT_AMBULATORY_CARE_PROVIDER_SITE_OTHER): Payer: 59 | Admitting: Family Medicine

## 2018-06-07 ENCOUNTER — Encounter: Payer: Self-pay | Admitting: Family Medicine

## 2018-06-07 VITALS — BP 138/82 | HR 70 | Temp 98.1°F | Ht 70.5 in | Wt 230.4 lb

## 2018-06-07 DIAGNOSIS — J0100 Acute maxillary sinusitis, unspecified: Secondary | ICD-10-CM | POA: Diagnosis not present

## 2018-06-07 DIAGNOSIS — J069 Acute upper respiratory infection, unspecified: Secondary | ICD-10-CM | POA: Diagnosis not present

## 2018-06-07 MED ORDER — AZITHROMYCIN 250 MG PO TABS: ORAL_TABLET | ORAL | 0 refills | Status: DC

## 2018-06-07 MED ORDER — GUAIFENESIN-CODEINE 100-10 MG/5ML PO SOLN: 10.0000 mL | Freq: Four times a day (QID) | ORAL | 0 refills | Status: DC | PRN

## 2018-06-07 MED ORDER — BENZONATATE 200 MG PO CAPS: 200.0000 mg | ORAL_CAPSULE | Freq: Two times a day (BID) | ORAL | 0 refills | Status: DC | PRN

## 2018-06-07 NOTE — Progress Notes (Signed)
Patient: [name] MRN: [id number] DOB: 1950-07-29 PCP: [name], MD     Subjective:  Chief Complaint  Patient presents with  . Cough  . Nasal Congestion    HPI: The patient is a 67 y.o. male who presents today for cough and congestion. He stated he started feeling bad about one week ago. He had headache, sinus pain/pressure, drainage, productive cough. He feels like his chest is tight, but denies this being a cardiac issue. Mainly on lower right side. His GI doc wants him to be seen being immunosuppressed. He as on humira and is now on prednisone while they wait to start another medication. No recorded fevers. Cough is productive with green/clear sputum. No shortness of breath or wheezing. He does have asthma, but feels like no exacerbation. His wife has similar symptoms, but is nearly better.   Review of Systems  Constitutional: Positive for chills. Negative for fever.  HENT: Positive for congestion, postnasal drip, rhinorrhea, sinus pressure, sinus pain and sore throat. Negative for ear pain.   Respiratory: Positive for cough. Negative for shortness of breath.   Cardiovascular: Positive for chest pain.       Chest feels tight when coughing  Gastrointestinal: Positive for abdominal pain and nausea.  Musculoskeletal: Negative for back pain and neck pain.  Neurological: Positive for headaches. Negative for dizziness.    Allergies Patient is allergic to mercaptopurine and shellfish allergy.  Past Medical History Patient  has a past medical history of Acute prostatitis (07/24/2007), Allergy, Arthritis, Asthma, Blood transfusion without reported diagnosis, BPH (benign prostatic hypertrophy) with urinary obstruction, Crohn's ileitis (HCC) suspected (05/03/2017), Dilated aortic root (HCC), Diverticulitis of colon, EPIDIDYMITIS (02/15/2010), GERD (gastroesophageal reflux disease), H/O: GI bleed, Hemorrhoids, Hepatitis (1975), HERPES SIMPLEX INFECTION (10/14/2007), Hiatal hernia, Ileus  following gastrointestinal surgery (HCC) (12/26/2011), Psoriasis, Recurrent ventral incisional hernia (05/10/2012), SVT (supraventricular tachycardia) (HCC), and Ulcer (08/21/2013).  Surgical History Patient  has a past surgical history that includes Hemicolectomy; Ileostomy; Cystoscopy; Bronchoscopy; Tonsillectomy; Vasectomy; laparotomy (12/19/2011); Bowel resection (12/19/2011); Colon surgery (01/2004); Hernia repair; Ileostomy closure; Ventral hernia repair (07/31/2012); Insertion of mesh (07/31/2012); Esophagogastroduodenoscopy; Colonoscopy w/ biopsies (04/26/2017); Hemorrhoid banding; and Upper gastrointestinal endoscopy.  Family History Pateint's family history includes Heart attack in his father; Heart disease in his father; Hypertension in his father; Leukemia in his father; Lung cancer in his mother; Prostate cancer in his father and paternal uncle.  Social History Patient  reports that he has never smoked. He has never used smokeless tobacco. He reports that he drinks alcohol. He reports that he does not use drugs.    Objective: Vitals:   06/07/18 1147  BP: 138/82  Pulse: 70  Temp: 98.1 F (36.7 C)  TempSrc: Oral  SpO2: 97%  Weight: 230 lb 6.4 oz (104.5 kg)  Height: 5' 10.5" (1.791 m)    Body mass index is 32.59 kg/m.  Physical Exam  Constitutional: He is oriented to person, place, and time. He appears well-developed and well-nourished.  HENT:  Right Ear: External ear normal.  Left Ear: External ear normal.  Mouth/Throat: Oropharynx is clear and moist.  TTP over frontal and right maxillary sinus  Nasal turbinates wnl Tm pearly with light reflex bilaterally    Neck: Normal range of motion. Neck supple.  Cardiovascular: Normal rate, regular rhythm and normal heart sounds.  Pulmonary/Chest: Effort normal and breath sounds normal. No respiratory distress.  Abdominal: Soft. Bowel sounds are normal.  Lymphadenopathy:    He has no cervical adenopathy.  Neurological: He  is alert  and oriented to person, place, and time.  Skin: Skin is warm and dry.  Vitals reviewed.      Assessment/plan: 1. Acute maxillary sinusitis, recurrence not specified Will increase prednisone to 40mg  x 5 days then go back down to normal dosage. Start flonase at night. If not better after 10 days would start zpack. Doing this to cover lungs as well and he prefers this medication.   2. Acute URI -discussed could be getting bronchitis. Conservative therapy with cool mist humidifier, cough medication, honey. Sending in codeine cough syrup at night. Discussed lungs clear and viral in 80%+ of cases. Supportive care. zpack sent in to start if symptoms start to get worse, fever, increased sputum.     Return if symptoms worsen or fail to improve.    Orland Mustard, MD [facility]   06/07/2018

## 2018-06-07 NOTE — Patient Instructions (Signed)
Cool mist humidifier at night flonase at night Honey for cough and robitussin DM during the day zpack only if fever/worsening symptoms or duration >10 days  Tessalon pearls prn for cough  Codeine cough syrup at night.   Acute Bronchitis, Adult Acute bronchitis is when air tubes (bronchi) in the lungs suddenly get swollen. The condition can make it hard to breathe. It can also cause these symptoms:  A cough.  Coughing up clear, yellow, or green mucus.  Wheezing.  Chest congestion.  Shortness of breath.  A fever.  Body aches.  Chills.  A sore throat.  Follow these instructions at home: Medicines  Take over-the-counter and prescription medicines only as told by your doctor.  If you were prescribed an antibiotic medicine, take it as told by your doctor. Do not stop taking the antibiotic even if you start to feel better. General instructions  Rest.  Drink enough fluids to keep your pee (urine) clear or pale yellow.  Avoid smoking and secondhand smoke. If you smoke and you need help quitting, ask your doctor. Quitting will help your lungs heal faster.  Use an inhaler, cool mist vaporizer, or humidifier as told by your doctor.  Keep all follow-up visits as told by your doctor. This is important. How is this prevented? To lower your risk of getting this condition again:  Wash your hands often with soap and water. If you cannot use soap and water, use hand sanitizer.  Avoid contact with people who have cold symptoms.  Try not to touch your hands to your mouth, nose, or eyes.  Make sure to get the flu shot every year.  Contact a doctor if:  Your symptoms do not get better in 2 weeks. Get help right away if:  You cough up blood.  You have chest pain.  You have very bad shortness of breath.  You become dehydrated.  You faint (pass out) or keep feeling like you are going to pass out.  You keep throwing up (vomiting).  You have a very bad headache.  Your  fever or chills gets worse. This information is not intended to replace advice given to you by your health care provider. Make sure you discuss any questions you have with your health care provider. Document Released: 12/20/2007 Document Revised: 02/09/2016 Document Reviewed: 12/22/2015 Elsevier Interactive Patient Education  2018 [employer].   Sinusitis, Adult Sinusitis is soreness and inflammation of your sinuses. Sinuses are hollow spaces in the bones around your face. They are located:  Around your eyes.  In the middle of your forehead.  Behind your nose.  In your cheekbones.  Your sinuses and nasal passages are lined with a stringy fluid (mucus). Mucus normally drains out of your sinuses. When your nasal tissues get inflamed or swollen, the mucus can get trapped or blocked so air cannot flow through your sinuses. This lets bacteria, viruses, and funguses grow, and that leads to infection. Follow these instructions at home: Medicines  Take, use, or apply over-the-counter and prescription medicines only as told by your doctor. These may include nasal sprays.  If you were prescribed an antibiotic medicine, take it as told by your doctor. Do not stop taking the antibiotic even if you start to feel better. Hydrate and Humidify  Drink enough water to keep your pee (urine) clear or pale yellow.  Use a cool mist humidifier to keep the humidity level in your home above 50%.  Breathe in steam for 10-15 minutes, 3-4 times a day  or as told by your doctor. You can do this in the bathroom while a hot shower is running.  Try not to spend time in cool or dry air. Rest  Rest as much as possible.  Sleep with your head raised (elevated).  Make sure to get enough sleep each night. General instructions  Put a warm, moist washcloth on your face 3-4 times a day or as told by your doctor. This will help with discomfort.  Wash your hands often with soap and water. If there is no soap and  water, use hand sanitizer.  Do not smoke. Avoid being around people who are smoking (secondhand smoke).  Keep all follow-up visits as told by your doctor. This is important. Contact a doctor if:  You have a fever.  Your symptoms get worse.  Your symptoms do not get better within 10 days. Get help right away if:  You have a very bad headache.  You cannot stop throwing up (vomiting).  You have pain or swelling around your face or eyes.  You have trouble seeing.  You feel confused.  Your neck is stiff.  You have trouble breathing. This information is not intended to replace advice given to you by your health care provider. Make sure you discuss any questions you have with your health care provider. Document Released: 12/20/2007 Document Revised: 02/27/2016 Document Reviewed: 04/28/2015 Elsevier Interactive Patient Education  [employer].

## 2018-06-07 NOTE — Telephone Encounter (Signed)
Patient notified of new plan.  He agrees to proceed with Stelara if his insurance approves.  He understands that we are now waiting to hear back about approval for the IV and sq dosages.  He has follow up with [name] on 06/18/18.  He understands we will talk when we all return from the Thanksgiving holidays

## 2018-06-18 ENCOUNTER — Ambulatory Visit: Payer: 59 | Admitting: Internal Medicine

## 2018-06-18 ENCOUNTER — Encounter: Payer: Self-pay | Admitting: Internal Medicine

## 2018-06-18 DIAGNOSIS — Z7960 Long term (current) use of unspecified immunomodulators and immunosuppressants: Secondary | ICD-10-CM

## 2018-06-18 DIAGNOSIS — Z7952 Long term (current) use of systemic steroids: Secondary | ICD-10-CM

## 2018-06-18 DIAGNOSIS — Z79899 Other long term (current) drug therapy: Secondary | ICD-10-CM

## 2018-06-18 DIAGNOSIS — K50018 Crohn's disease of small intestine with other complication: Secondary | ICD-10-CM

## 2018-06-18 HISTORY — DX: Long term (current) use of systemic steroids: Z79.52

## 2018-06-18 MED ORDER — PREDNISONE 10 MG PO TABS: 30.0000 mg | ORAL_TABLET | Freq: Every day | ORAL | 1 refills | Status: DC

## 2018-06-18 NOTE — Assessment & Plan Note (Signed)
Tapering Side effects reviewed

## 2018-06-18 NOTE — Assessment & Plan Note (Signed)
Try to go to 20 mg prednisone after Stelara starts F/U Feb likely

## 2018-06-18 NOTE — Progress Notes (Signed)
[name] 67 y.o. [birth year] [id number]  Assessment & Plan:  Crohn's ileitis (HCC)  Try to go to 20 mg prednisone after Stelara starts F/U Feb likely      Long-term use of immunosuppressant medication   To start Stelara  Long term (current) use of systemic steroids Tapering Side effects reviewed   I appreciate the opportunity to care for this patient. CC: Justin Salisbury, MD [facility]   Subjective:   Chief Complaint: Follow-up of Crohn's ileitis  HPI [name] is here on 30 mg of prednisone daily.  He has been on that for several weeks coming down from 40 mg.  He has failed O'Mara, he developed antibodies.  Budesonide had worked in the past but he had to go to prednisone due to lack of efficacy on 9 mg budesonide.  In 3 days he is supposed to start Stelara, we hope at [facility] rheumatology if the approvals have all gone through.  Cimzia was my initial recommendation but that was denied.  Note he is also intolerant of 6-MP.  At this point in general he is okay with mild episodic diarrhea treated with antidiarrheals, some occasional right lower quadrant pain not bad.  He has gained 7 pounds he says on prednisone and especially with the Thanksgiving holidays in between. Allergies  Allergen Reactions  . Mercaptopurine Other (See Comments)    Pancreatitis   . Shellfish Allergy     Throat stated to close   Current Meds  Medication Sig  . albuterol (PROVENTIL HFA;VENTOLIN HFA) 108 (90 Base) MCG/ACT inhaler Inhale 2 puffs into the lungs every 4 (four) hours as needed for wheezing or shortness of breath.  Marland Kitchen azithromycin (ZITHROMAX) 250 MG tablet 2 pills today and then 1 pill days 2-5.  . benzonatate (TESSALON) 200 MG capsule Take 1 capsule (200 mg total) by mouth 2 (two) times daily as needed for cough.  . diphenoxylate-atropine (LOMOTIL) 2.5-0.025 MG tablet 1-2 tablets 4 times a day as neded  . guaiFENesin-codeine 100-10 MG/5ML syrup Take 10 mLs by  mouth every 6 (six) hours as needed for cough.  . halobetasol (ULTRAVATE) 0.05 % ointment Apply topically 2 (two) times daily.  . hyoscyamine (LEVSIN SL) 0.125 MG SL tablet PLACE 1 TABLET UNDER THE TONGUE EVERY 4 HOURS AS NEEDED FOR CRAMPING (URGENT DEFECATION)  . ketoconazole (NIZORAL) 2 % cream Apply 1 application topically 2 (two) times daily as needed (fungal infections).  . Multiple Vitamin (MULTIVITAMIN WITH MINERALS) TABS Take 1 tablet by mouth daily.  . predniSONE (DELTASONE) 10 MG tablet Take 3 tablets (30 mg total) by mouth daily with breakfast. Will advise taper in 1 week  . tadalafil (CIALIS) 5 MG tablet Take 1 tablet (5 mg total) by mouth daily.  . traZODone (DESYREL) 100 MG tablet Take 1 tablet (100 mg total) by mouth at bedtime.   Past Medical History:  Diagnosis Date  . Acute prostatitis 07/24/2007   Qualifier: Diagnosis of  By: Clent Ridges MD, Tera Mater   . Allergy    mild  . Arthritis    osteoarthritis  . Asthma   . Blood transfusion without reported diagnosis   . BPH (benign prostatic hypertrophy) with urinary obstruction   . Crohn's ileitis (HCC) suspected 05/03/2017  . Dilated aortic root (HCC)    noted on echo 08/2012  . Diverticulitis of colon   . EPIDIDYMITIS 02/15/2010   Qualifier: Diagnosis of  By: Clent Ridges MD, Tera Mater   . GERD (gastroesophageal reflux disease)   .  H/O: GI bleed   . Hemorrhoids   . Hepatitis 1975   unknown type   . HERPES SIMPLEX INFECTION 10/14/2007   Qualifier: Diagnosis of  By: Clent Ridges MD, Tera Mater   . Hiatal hernia   . Ileus following gastrointestinal surgery (HCC) 12/26/2011  . Psoriasis    sees Dr. Karene Fry at [facility].  . Recurrent ventral incisional hernia 05/10/2012  . SVT (supraventricular tachycardia) (HCC)   . Ulcer 08/21/2013   ileal   Past Surgical History:  Procedure Laterality Date  . BOWEL RESECTION  12/19/2011   Procedure: SMALL BOWEL RESECTION;  Surgeon: Mariella Saa, MD;  Location: [facility];  Service: General;   Laterality: N/A;  with anastamosis and insertion mesh  . BRONCHOSCOPY    . COLON SURGERY  01/2004  . COLONOSCOPY W/ BIOPSIES  04/26/2017   per Dr. Leone Payor, no polyps, benign inflammation, repeat in 5 yrs   . CYSTOSCOPY    . ESOPHAGOGASTRODUODENOSCOPY    . HEMICOLECTOMY     left side, at [facility], diverticulitis  . HEMORRHOID BANDING    . HERNIA REPAIR     [phone] incisional hernia  . ILEOSTOMY    . ILEOSTOMY CLOSURE    . INSERTION OF MESH  07/31/2012   Procedure: INSERTION OF MESH;  Surgeon: Mariella Saa, MD;  Location: [facility];  Service: General;;  . LAPAROTOMY  12/19/2011   Procedure: EXPLORATORY LAPAROTOMY;  Surgeon: Mariella Saa, MD;  Location: [facility];  Service: General;  Laterality: N/A;  . TONSILLECTOMY    . UPPER GASTROINTESTINAL ENDOSCOPY    . VASECTOMY    . 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   Review of Systems As per HPI  Objective:   Physical Exam BP (!) 142/90   Pulse 82   Ht 5\' 10"  (1.778 m)   Wt 234 lb (106.1 kg)   BMI 33.58 kg/m  No acute distress Eyes are anicteric Abdomen is soft well-healed surgical scar in the midline, he has some mild right lower quadrant tenderness and slight fullness but no mass.

## 2018-06-18 NOTE — Patient Instructions (Signed)
After Friday, 12/6, decrease your Prednisone to 44m daily.  Message Dr. GCarlean Purlafter your infusion to discuss the plan going forward

## 2018-06-18 NOTE — Assessment & Plan Note (Signed)
To start Stelara

## 2018-06-19 ENCOUNTER — Telehealth: Payer: Self-pay

## 2018-06-19 NOTE — Telephone Encounter (Signed)
-----   Message from Gatha Mayer, MD sent at 06/18/2018  4:27 PM EST ----- Regarding: Please fax Please fax my last office note to [facility] and note on there that the patient has an appointment for Stelara infusion Friday  I do not know which provider he will see or if he will just go to the infusion center but I wanted them to have the note  Thanks

## 2018-06-19 NOTE — Telephone Encounter (Signed)
Faxed office note with notation of Stelara infusion to [facility]

## 2018-06-28 ENCOUNTER — Other Ambulatory Visit: Payer: Self-pay | Admitting: Internal Medicine

## 2018-06-30 ENCOUNTER — Other Ambulatory Visit: Payer: Self-pay | Admitting: Internal Medicine

## 2018-07-01 ENCOUNTER — Telehealth: Payer: Self-pay

## 2018-07-01 NOTE — Telephone Encounter (Signed)
I spoke with the patient he is interested in home self injection for Stelara.  He is notified that we will initiate the prior authorization for Stelara.  Patient is advised to sign up for the [facility] program on line.  I spoke with [facility] they will work on the prior authorization for the q 8 week stelara

## 2018-07-31 ENCOUNTER — Telehealth: Payer: Self-pay

## 2018-07-31 NOTE — Telephone Encounter (Signed)
-----   Message from Gatha Mayer, MD sent at 07/31/2018  1:33 PM EST ----- Regarding: Stelara Do we know if he was able to get and start this?  [facility]

## 2018-07-31 NOTE — Telephone Encounter (Signed)
Left message for patient to call back  

## 2018-08-02 MED ORDER — USTEKINUMAB 90 MG/ML ~~LOC~~ SOSY: 90.0000 mg | PREFILLED_SYRINGE | SUBCUTANEOUS | 6 refills | Status: DC

## 2018-08-02 NOTE — Telephone Encounter (Signed)
Patient had infusion, but has not received the shipment for his first infusion due in the end of Feb./  Rx sent again.  This time to [facility]

## 2018-08-02 NOTE — Addendum Note (Signed)
Addended by: Annett Fabian on: 08/02/2018 12:01 PM   Modules accepted: Orders

## 2018-09-06 ENCOUNTER — Telehealth: Payer: Self-pay | Admitting: Internal Medicine

## 2018-09-06 ENCOUNTER — Emergency Department (HOSPITAL_COMMUNITY): Payer: 59

## 2018-09-06 ENCOUNTER — Emergency Department (HOSPITAL_COMMUNITY)
Admission: EM | Admit: 2018-09-06 | Discharge: 2018-09-06 | Disposition: A | Discharge status: Home / Self Care | Payer: 59 | Attending: Emergency Medicine | Admitting: Emergency Medicine

## 2018-09-06 ENCOUNTER — Other Ambulatory Visit: Payer: Self-pay

## 2018-09-06 DIAGNOSIS — R1033 Periumbilical pain: Secondary | ICD-10-CM | POA: Diagnosis present

## 2018-09-06 DIAGNOSIS — J45909 Unspecified asthma, uncomplicated: Secondary | ICD-10-CM | POA: Insufficient documentation

## 2018-09-06 LAB — CBC
HCT: 48.3 % (ref 39.0–52.0)
Hemoglobin: 15.3 g/dL (ref 13.0–17.0)
MCH: 29.7 pg (ref 26.0–34.0)
MCHC: 31.7 g/dL (ref 30.0–36.0)
MCV: 93.8 fL (ref 80.0–100.0)
Platelets: 192 10*3/uL (ref 150–400)
RBC: 5.15 MIL/uL (ref 4.22–5.81)
RDW: 13 % (ref 11.5–15.5)
WBC: 13.4 10*3/uL — ABNORMAL HIGH (ref 4.0–10.5)
nRBC: 0 % (ref 0.0–0.2)

## 2018-09-06 LAB — COMPREHENSIVE METABOLIC PANEL WITH GFR
ALT: 21 U/L (ref 0–44)
AST: 25 U/L (ref 15–41)
Albumin: 3.9 g/dL (ref 3.5–5.0)
Alkaline Phosphatase: 54 U/L (ref 38–126)
Anion gap: 8 (ref 5–15)
BUN: 14 mg/dL (ref 8–23)
CO2: 25 mmol/L (ref 22–32)
Calcium: 8.5 mg/dL — ABNORMAL LOW (ref 8.9–10.3)
Chloride: 105 mmol/L (ref 98–111)
Creatinine, Ser: 1 mg/dL (ref 0.61–1.24)
GFR calc Af Amer: >60 mL/min
GFR calc non Af Amer: >60 mL/min
Glucose, Bld: 120 mg/dL — ABNORMAL HIGH (ref 70–99)
Potassium: 3.8 mmol/L (ref 3.5–5.1)
Sodium: 138 mmol/L (ref 135–145)
Total Bilirubin: 0.9 mg/dL (ref 0.3–1.2)
Total Protein: 6.6 g/dL (ref 6.5–8.1)

## 2018-09-06 LAB — URINALYSIS, ROUTINE W REFLEX MICROSCOPIC
Bilirubin Urine: NEGATIVE
Glucose, UA: NEGATIVE mg/dL
Hgb urine dipstick: NEGATIVE
KETONES UR: NEGATIVE mg/dL
Leukocytes,Ua: NEGATIVE
Nitrite: NEGATIVE
PROTEIN: NEGATIVE mg/dL
Specific Gravity, Urine: 1.012 (ref 1.005–1.030)
pH: 7 (ref 5.0–8.0)

## 2018-09-06 LAB — LACTIC ACID, PLASMA: Lactic Acid, Venous: 1.9 mmol/L (ref 0.5–1.9)

## 2018-09-06 LAB — LIPASE, BLOOD: Lipase: 34 U/L (ref 11–51)

## 2018-09-06 MED ORDER — HYDROMORPHONE HCL 1 MG/ML IJ SOLN
0.5000 mg | Freq: Once | INTRAMUSCULAR | Status: AC
  Administered 2018-09-06: 0.5 mg via INTRAVENOUS
  Filled 2018-09-06: qty 1

## 2018-09-06 MED ORDER — HYDROMORPHONE HCL 1 MG/ML IJ SOLN
0.5000 mg | Freq: Once | INTRAMUSCULAR | Status: DC
  Filled 2018-09-06: qty 1

## 2018-09-06 MED ORDER — IOHEXOL 300 MG/ML  SOLN: 30.0000 mL | Freq: Once | INTRAMUSCULAR | Status: DC | PRN

## 2018-09-06 MED ORDER — SODIUM CHLORIDE (PF) 0.9 % IJ SOLN
INTRAMUSCULAR | Status: AC
  Filled 2018-09-06: qty 50

## 2018-09-06 MED ORDER — SODIUM CHLORIDE 0.9 % IV BOLUS
1000.0000 mL | Freq: Once | INTRAVENOUS | Status: AC
  Administered 2018-09-06: 1000 mL via INTRAVENOUS

## 2018-09-06 MED ORDER — IOPAMIDOL (ISOVUE-300) INJECTION 61%
100.0000 mL | Freq: Once | INTRAVENOUS | Status: AC | PRN
  Administered 2018-09-06: 100 mL via INTRAVENOUS

## 2018-09-06 MED ORDER — OXYCODONE HCL 5 MG PO TABS: 5.0000 mg | ORAL_TABLET | ORAL | 0 refills | Status: DC | PRN

## 2018-09-06 MED ORDER — IOPAMIDOL (ISOVUE-300) INJECTION 61%
INTRAVENOUS | Status: AC
  Filled 2018-09-06: qty 100

## 2018-09-06 NOTE — ED Notes (Signed)
Patient transported to CT 

## 2018-09-06 NOTE — Discharge Instructions (Signed)
You were evaluated in the Emergency Department and after careful evaluation, we did not find any emergent condition requiring admission or further testing in the hospital.  Your labs and CT today were all very reassuring.  The cause of your pain is still unclear.  Please mention your ED visit and your symptoms to your regular doctors.  Please return to the Emergency Department if you experience any worsening of your condition.  We encourage you to follow up with a primary care provider.  Thank you for allowing [place] to be a part of your care.

## 2018-09-06 NOTE — ED Notes (Signed)
Attempted IV start x2, unsuccessful.  Requested [place] IV placement.

## 2018-09-06 NOTE — ED Provider Notes (Signed)
[facility] Emergency Department Provider Note MRN:  [id number]  [place] date & time: 09/06/18     Chief Complaint   Abdominal pain History of Present Illness   [name] is a 68 y.o. year-old male with a history of Crohn's disease presenting to the ED with chief complaint of abdominal pain.  Pain present for 2 days, waxing and waning, currently 6 out of 10 in severity, located in the periumbilical region, has been constant.  Denies nausea or vomiting, no headache or vision change, no chest pain or shortness of breath.  No bowel movements for the past 2 days, patient does not think he has been passing gas either.  This is very abnormal for him, because with his Crohn's he normally has 4+ bowel movements per day.  History of multiple abdominal surgeries.  No exacerbating relieving factors.  Review of Systems  A complete 10 system review of systems was obtained and all systems are negative except as noted in the HPI and PMH.   Patient's Health History    Past Medical History:  Diagnosis Date  . Acute prostatitis 07/24/2007   Qualifier: Diagnosis of  By: [name] MD, [name]   . Allergy    mild  . Arthritis    osteoarthritis  . Asthma   . Blood transfusion without reported diagnosis   . BPH (benign prostatic hypertrophy) with urinary obstruction   . Crohn's ileitis ([place]) suspected 05/03/2017  . Dilated aortic root ([place])    noted on echo 08/2012  . Diverticulitis of colon   . EPIDIDYMITIS 02/15/2010   Qualifier: Diagnosis of  By: [name] MD, [name]   . GERD (gastroesophageal reflux disease)   . H/O: GI bleed   . Hemorrhoids   . Hepatitis 1975   unknown type   . HERPES SIMPLEX INFECTION 10/14/2007   Qualifier: Diagnosis of  By: [name] MD, [name]   . Hiatal hernia   . Ileus following gastrointestinal surgery ([place]) 12/26/2011  . Psoriasis    sees [name] at [facility].  . Recurrent ventral incisional hernia 05/10/2012  . SVT (supraventricular  tachycardia) ([place])   . Ulcer 08/21/2013   ileal    Past Surgical History:  Procedure Laterality Date  . BOWEL RESECTION  12/19/2011   Procedure: SMALL BOWEL RESECTION;  Surgeon: [name], MD;  Location: [facility];  Service: General;  Laterality: N/A;  with anastamosis and insertion mesh  . BRONCHOSCOPY    . COLON SURGERY  01/2004  . COLONOSCOPY W/ BIOPSIES  04/26/2017   per [name], no polyps, benign inflammation, repeat in 5 yrs   . CYSTOSCOPY    . ESOPHAGOGASTRODUODENOSCOPY    . HEMICOLECTOMY     left side, at [facility], diverticulitis  . HEMORRHOID BANDING    . HERNIA REPAIR     [phone] incisional hernia  . ILEOSTOMY    . ILEOSTOMY CLOSURE    . INSERTION OF MESH  07/31/2012   Procedure: INSERTION OF MESH;  Surgeon: [name], MD;  Location: [facility];  Service: General;;  . LAPAROTOMY  12/19/2011   Procedure: EXPLORATORY LAPAROTOMY;  Surgeon: [name], MD;  Location: [facility];  Service: General;  Laterality: N/A;  . TONSILLECTOMY    . UPPER GASTROINTESTINAL ENDOSCOPY    . VASECTOMY    . VENTRAL HERNIA REPAIR  07/31/2012   Procedure: HERNIA REPAIR VENTRAL ADULT;  Surgeon: [name], MD;  Location: [facility];  Service: General;  Laterality:  N/A;    Family History  Problem Relation Age of Onset  . Lung cancer Mother   . Leukemia Father   . Hypertension Father   . Heart disease Father   . Heart attack Father   . Prostate cancer Father   . Prostate cancer Paternal Uncle   . Colon cancer Neg Hx   . Stomach cancer Neg Hx   . Colon polyps Neg Hx   . Esophageal cancer Neg Hx   . Rectal cancer Neg Hx     Social History   Socioeconomic History  . Marital status: Married    Spouse name: Not on file  . Number of children: 2  . Years of education: Not on file  . Highest education level: Not on file  Occupational History  . 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 Financial resource strain: Not on file  . Food  insecurity:    Worry: Not on file    Inability: Not on file  . Transportation needs:    Medical: Not on file    Non-medical: Not on file  Tobacco Use  . Smoking status: Never Smoker  . Smokeless tobacco: Never Used  Substance and Sexual Activity  . Alcohol use: Yes    Alcohol/week: 0.0 standard drinks    Comment: once a month or less  . Drug use: No  . Sexual activity: Yes    Partners: Female  Lifestyle  . Physical activity:    Days per week: Not on file    Minutes per session: Not on file  . Stress: Not on file  Relationships  . Social connections:    Talks on phone: Not on file    Gets together: Not on file    Attends religious service: Not on file    Active member of club or organization: Not on file    Attends meetings of clubs or organizations: Not on file    Relationship status: Not on file  . Intimate partner violence:    Fear of current or ex partner: Not on file    Emotionally abused: Not on file    Physically abused: Not on file    Forced sexual activity: Not on file  Other Topics Concern  . 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 [place]   Rare if any caffeine     Physical Exam  Vital Signs and Nursing Notes reviewed Vitals:   09/06/18 1130 09/06/18 1330  BP: (!) 161/84 (!) 150/71  Pulse: 61 63  Resp: 16 16  Temp:    SpO2: 97% 98%    CONSTITUTIONAL: Well-appearing, NAD NEURO:  Alert and oriented x 3, no focal deficits EYES:  eyes equal and reactive ENT/NECK:  no LAD, no JVD CARDIO: Regular rate, well-perfused, normal S1 and S2 PULM:  CTAB no wheezing or rhonchi GI/GU:  normal bowel sounds, non-distended, moderate tenderness palpation to the periumbilical region MSK/SPINE:  No gross deformities, no edema SKIN:  no rash, atraumatic PSYCH:  Appropriate speech and behavior  Diagnostic and Interventional Summary    Labs Reviewed  CBC - Abnormal; Notable for  the following components:      Result Value   WBC 13.4 (*)    All other components within normal limits  COMPREHENSIVE METABOLIC PANEL - Abnormal; Notable for the following components:   Glucose, Bld 120 (*)  Calcium 8.5 (*)    All other components within normal limits  LIPASE, BLOOD  URINALYSIS, ROUTINE W REFLEX MICROSCOPIC  LACTIC ACID, PLASMA    CT ABDOMEN PELVIS W CONTRAST  Final Result      Medications  iohexol (OMNIPAQUE) 300 MG/ML solution 30 mL (has no administration in time range)  HYDROmorphone (DILAUDID) injection 0.5 mg (has no administration in time range)  iopamidol (ISOVUE-300) 61 % injection (has no administration in time range)  sodium chloride (PF) 0.9 % injection (has no administration in time range)  sodium chloride 0.9 % bolus 1,000 mL (1,000 mLs Intravenous Bolus 09/06/18 1159)  HYDROmorphone (DILAUDID) injection 0.5 mg (0.5 mg Intravenous Given 09/06/18 1051)  iopamidol (ISOVUE-300) 61 % injection 100 mL (100 mLs Intravenous Contrast Given 09/06/18 1420)     Procedures Critical Care  ED Course and Medical Decision Making  I have reviewed the triage vital signs and the nursing notes.  Pertinent labs & imaging results that were available during my care of the patient were reviewed by me and considered in my medical decision making (see below for details).  Concern for small bowel obstruction versus pancreatitis versus Crohn's flare in this 68 year old male with abdominal pain, decreased bowel movements and/or flatulence.  Labs, CT pending.  Labs unremarkable, urinalysis normal, CT normal.  Unclear cause of pain but no emergent conditions today.  Patient is feeling well enough for discharge, will call his regular doctors within the next few days for reassessment.  Strict return precautions.  After the discussed management above, the patient was determined to be safe for discharge.  The patient was in agreement with this plan and all questions regarding their  care were answered.  ED return precautions were discussed and the patient will return to the ED with any significant worsening of condition.  Barth Kirks. Sedonia Small, MD [place] [email or web address]  Final Clinical Impressions(s) / ED Diagnoses     ICD-10-CM   1. Periumbilical abdominal pain R10.33     ED Discharge Orders         Ordered    oxyCODONE (ROXICODONE) 5 MG immediate release tablet  Every 4 hours PRN     09/06/18 1511             Maudie Flakes, MD 09/06/18 1513

## 2018-09-06 NOTE — ED Triage Notes (Signed)
Pt BIBA from home c/o umbilical/abdominal pain since yesterday.  Hx of incarcerated umbilical hernia - surgery in 2013. Hx of Crohns, hx of pancreatitis. Pain feels "similar to pancreatitis"  Pain 6/10.  C/o nausea, denies vomiting.   AOx4, ambulatory on scene.

## 2018-09-06 NOTE — Telephone Encounter (Signed)
Pt is at in ED at [facility] for abd p.  Pt is due for Stelara inj on Monday and asked if it inj would be appropriate.

## 2018-09-06 NOTE — ED Notes (Signed)
Bed: FY92 Expected date:  Expected time:  Means of arrival:  Comments: EMS-umbilical hernia/constipation

## 2018-09-06 NOTE — Telephone Encounter (Signed)
Dr. Gessner please review and advise 

## 2018-09-07 MED ORDER — ESOMEPRAZOLE MAGNESIUM 40 MG PO CPDR: 40.0000 mg | DELAYED_RELEASE_CAPSULE | Freq: Every day | ORAL | 2 refills | Status: DC

## 2018-09-07 NOTE — Telephone Encounter (Signed)
He is better today but still tender in upper abdomen. Ct and labs ok Go ahead with Stelara Monday I have Rxed esomeprazole He is to update [place] Monday and if not improving adequately I will see him week of 2/25

## 2018-09-16 ENCOUNTER — Encounter: Payer: Self-pay | Admitting: Family Medicine

## 2018-09-16 NOTE — Telephone Encounter (Signed)
Dr. Fry please advise. Thanks  

## 2018-09-16 NOTE — Telephone Encounter (Signed)
No I do not think this puts him at higher risk of getting the virus, but it may increase the risk of a viral infection progressing to pneumonia. I would suggest postponing this trip for the time being

## 2018-09-17 ENCOUNTER — Encounter: Payer: Self-pay | Admitting: Internal Medicine

## 2018-09-17 ENCOUNTER — Telehealth: Payer: Self-pay | Admitting: Internal Medicine

## 2018-09-17 NOTE — Telephone Encounter (Signed)
Pt requested a recommendation from [name] regarding air travel during the [place] virus outbreak.

## 2018-09-17 NOTE — Telephone Encounter (Signed)
Spoke to patient re: travel concerns He is immunocompromised and I think he should avoid air travel now. Letter created and sent to him

## 2018-09-17 NOTE — Telephone Encounter (Signed)
See question.

## 2018-09-23 ENCOUNTER — Telehealth: Payer: Self-pay | Admitting: Internal Medicine

## 2018-09-23 MED ORDER — PREDNISONE 10 MG PO TABS: 10.0000 mg | ORAL_TABLET | Freq: Every day | ORAL | 0 refills | Status: DC

## 2018-09-23 NOTE — Telephone Encounter (Signed)
Pt called stating needing refill of predniSONE (DELTASONE) 10 MG tablet [[id number]]  He has taken his last one... he has appt with dr.gessner on 10/03/2018 but pt believe  its to stop the prednisone but wasn't sure if he should get refill or not.

## 2018-09-23 NOTE — Telephone Encounter (Signed)
Refill prednisone  10 mg daily # 90 no refill

## 2018-09-23 NOTE — Telephone Encounter (Signed)
I spoke with [facility] and informed him that he is to continue the prednisone at 10mg  daily. Confirmed pharmacy and sent in his prednisone refill.

## 2018-09-23 NOTE — Telephone Encounter (Signed)
Please advise Sir, thank you. 

## 2018-10-02 ENCOUNTER — Telehealth: Payer: Self-pay

## 2018-10-02 NOTE — Telephone Encounter (Signed)
Covid-19 travel screening questions  Have you traveled in the last 14 days? NO If yes where?  Do you now or have you had a fever in the last 14 days? NO  Do you have any respiratory symptoms of shortness of breath or cough now or in the last 14 days? NO   Do you have any family members or close contacts with diagnosed or suspected Covid-19? NO       

## 2018-10-03 ENCOUNTER — Ambulatory Visit: Payer: 59 | Admitting: Internal Medicine

## 2018-10-03 ENCOUNTER — Other Ambulatory Visit: Payer: Self-pay

## 2018-10-03 ENCOUNTER — Encounter: Payer: Self-pay | Admitting: Internal Medicine

## 2018-10-03 DIAGNOSIS — Z79899 Other long term (current) drug therapy: Secondary | ICD-10-CM | POA: Diagnosis not present

## 2018-10-03 DIAGNOSIS — Z7952 Long term (current) use of systemic steroids: Secondary | ICD-10-CM

## 2018-10-03 DIAGNOSIS — K5000 Crohn's disease of small intestine without complications: Secondary | ICD-10-CM

## 2018-10-03 DIAGNOSIS — R1013 Epigastric pain: Secondary | ICD-10-CM | POA: Insufficient documentation

## 2018-10-03 DIAGNOSIS — Z7960 Long term (current) use of unspecified immunomodulators and immunosuppressants: Secondary | ICD-10-CM

## 2018-10-03 NOTE — Progress Notes (Signed)
[name] 68 y.o. [birth year] [id number]  Assessment & Plan:   Crohn's ileitis [facility])  He is significantly improved with the prednisone and the Stelara.  Will taper the prednisone over the next 3 weeks going 10 mg daily for another week and then 5 mg daily for 2 weeks and stopping.  Continue the Stelara. I have asked him to remind himself to call us in late May to get a June or July follow-up and to contact me sooner as needed.  Abdominal pain, epigastric This responded very well to Nexium, he will continue that and when he stops his prednisone see if he can come off it.  Working diagnosis is gastritis, could be ulcer, it occurred in the setting of prednisone therapy so I am attributing it to that.   Long term (current) use of systemic steroids We are tapering these.  Long-term use of immunosuppressant medication  - Stelara No problems noted.  Reviewed increased precautions with the Covid 19 pandemic.  I had previously issued him a work letter so that he did not have to travel and he is grateful.     Subjective:   Chief Complaint: Follow-up of Crohn's disease after starting Stelara, epigastric pain  HPI [name] is here for follow-up, he initiated Stelara early this year, and has had the infusion and 1 subcutaneous dose.  He has been on prednisone and is tapering down on 10 mg.  In February he developed epigastric pain and went to the ER because it was severe and he has a history of an incarcerated hernia so he does not like to take chances.  On the 21st he had a CT scan of the abdomen and pelvis that was negative.  We communicated electronically and I started him on Nexium 40 mg daily generic and he had a prompt response and is feeling well on that.  He is not having significant diarrhea 1 or 2 bowel movements a day that tend to be mostly formed and he feels like he is significantly improved with respect to his Crohn's disease.  He was supposed to go on a trip for work and contacted  me for a work note the other week and I issued that and he did not have to travel.  It was not the same meeting he was to attend but some of his coworkers from around the country travel to a meeting and became infected with the coronavirus. Allergies  Allergen Reactions   Mercaptopurine Other (See Comments)    Pancreatitis    Shellfish Allergy Anaphylaxis    Throat started to close   Current Meds  Medication Sig   albuterol (PROVENTIL HFA;VENTOLIN HFA) 108 (90 Base) MCG/ACT inhaler Inhale 2 puffs into the lungs every 4 (four) hours as needed for wheezing or shortness of breath.   diphenoxylate-atropine (LOMOTIL) 2.5-0.025 MG tablet 1-2 tablets 4 times a day as neded (Patient taking differently: Take 1 tablet by mouth 4 (four) times daily as needed for diarrhea or loose stools. )   esomeprazole (NEXIUM) 40 MG capsule Take 1 capsule (40 mg total) by mouth daily before breakfast.   halobetasol (ULTRAVATE) 0.05 % ointment Apply topically 2 (two) times daily. (Patient taking differently: Apply 1 application topically 2 (two) times daily as needed (for skin infections). )   hyoscyamine (LEVSIN SL) 0.125 MG SL tablet PLACE 1 TABLET UNDER THE TONGUE EVERY 4 HOURS AS NEEDED FOR CRAMPING (URGENT DEFECATION) (Patient taking differently: Take 0.125 mg by mouth every 4 (four) hours as  needed for cramping. )   ketoconazole (NIZORAL) 2 % cream Apply 1 application topically 2 (two) times daily as needed (fungal infections).   Multiple Vitamin (MULTIVITAMIN WITH MINERALS) TABS Take 1 tablet by mouth daily.   Multiple Vitamins-Minerals (IMMUNE SUPPORT VITAMIN C PO) Take 1 tablet by mouth daily.   predniSONE (DELTASONE) 10 MG tablet Take 1 tablet (10 mg total) by mouth daily with breakfast.   tadalafil (CIALIS) 5 MG tablet Take 1 tablet (5 mg total) by mouth daily. (Patient taking differently: Take 5 mg by mouth daily as needed for erectile dysfunction. )   traZODone (DESYREL) 100 MG tablet Take 1  tablet (100 mg total) by mouth at bedtime. (Patient taking differently: Take 50 mg by mouth at bedtime. )   ustekinumab (STELARA) 90 MG/ML SOSY injection Inject 1 mL (90 mg total) into the skin every 8 (eight) weeks.   Past Medical History:  Diagnosis Date   Acute prostatitis 07/24/2007   Qualifier: Diagnosis of  By: [name] MD, [name]    Allergy    mild   Arthritis    osteoarthritis   Asthma    Blood transfusion without reported diagnosis    BPH (benign prostatic hypertrophy) with urinary obstruction    Crohn's ileitis ([place]) suspected 05/03/2017   Dilated aortic root ([place])    noted on echo 08/2012   Diverticulitis of colon    EPIDIDYMITIS 02/15/2010   Qualifier: Diagnosis of  By: [name] MD, [name]    GERD (gastroesophageal reflux disease)    H/O: GI bleed    Hemorrhoids    Hepatitis 1975   unknown type    HERPES SIMPLEX INFECTION 10/14/2007   Qualifier: Diagnosis of  By: [name] MD, [name]    Hiatal hernia    Ileus following gastrointestinal surgery ([place]) 12/26/2011   Psoriasis    sees [name] at [facility].   Recurrent ventral incisional hernia 05/10/2012   SVT (supraventricular tachycardia) ([place])    Ulcer 08/21/2013   ileal   Past Surgical History:  Procedure Laterality Date   BOWEL RESECTION  12/19/2011   Procedure: SMALL BOWEL RESECTION;  Surgeon: [name], MD;  Location: [facility];  Service: General;  Laterality: N/A;  with anastamosis and insertion mesh   BRONCHOSCOPY     COLON SURGERY  01/2004   COLONOSCOPY W/ BIOPSIES  04/26/2017   per [name], no polyps, benign inflammation, repeat in 5 yrs    CYSTOSCOPY     ESOPHAGOGASTRODUODENOSCOPY     HEMICOLECTOMY     left side, at [facility], diverticulitis   [place]     [phone] incisional hernia   ILEOSTOMY     ILEOSTOMY CLOSURE     INSERTION OF MESH  07/31/2012   Procedure: INSERTION OF MESH;  Surgeon: [name], MD;  Location: [facility];   Service: General;;   LAPAROTOMY  12/19/2011   Procedure: EXPLORATORY LAPAROTOMY;  Surgeon: [name], MD;  Location: [facility];  Service: General;  Laterality: N/A;   TONSILLECTOMY     UPPER GASTROINTESTINAL ENDOSCOPY     VASECTOMY     VENTRAL HERNIA REPAIR  07/31/2012   Procedure: HERNIA REPAIR VENTRAL ADULT;  Surgeon: [name], MD;  Location: [facility];  Service: General;  Laterality: N/A;   Social History   Social History Narrative   He is married with 2 sons   He is Nurse, 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   Review of Systems Weight gain on prednisone  Wt Readings from Last 3 Encounters:  10/03/18 241 lb 8 oz (109.5 kg)  06/18/18 234 lb (106.1 kg)  06/07/18 230 lb 6.4 oz (104.5 kg)     Objective:   Physical Exam @BP  120/80 (BP Location: Left Arm, Patient Position: Sitting, Cuff Size: Normal)    Pulse 80    Temp 98.2 F (36.8 C)    Ht 5' 10.5" (1.791 m)    Wt 241 lb 8 oz (109.5 kg)    BMI 34.16 kg/m @  General:  NAD Eyes:   anicteric Lungs:  clear Heart::  S1S2 no rubs, murmurs or gallops Abdomen:  soft and nontender, BS+, obese, surgical scars present Ext:   no edema, cyanosis or clubbing    Data Reviewed:   See HPI

## 2018-10-03 NOTE — Assessment & Plan Note (Signed)
We are tapering these.

## 2018-10-03 NOTE — Assessment & Plan Note (Addendum)
This responded very well to Nexium, he will continue that and when he stops his prednisone see if he can come off it.  Working diagnosis is gastritis, could be ulcer, it occurred in the setting of prednisone therapy so I am attributing it to that.

## 2018-10-03 NOTE — Assessment & Plan Note (Addendum)
He is significantly improved with the prednisone and the Stelara.  Will taper the prednisone over the next 3 weeks going 10 mg daily for another week and then 5 mg daily for 2 weeks and stopping.  Continue the Stelara. I have asked him to remind himself to call us in late May to get a June or July follow-up and to contact me sooner as needed.

## 2018-10-03 NOTE — Assessment & Plan Note (Signed)
No problems noted.  Reviewed increased precautions with the Covid 19 pandemic.  I had previously issued him a work letter so that he did not have to travel and he is grateful.

## 2018-10-03 NOTE — Patient Instructions (Signed)
  Stay on your Nexium until you finish your prednisone.   Taper your Prednisone as follows:  Take 10mg  for a total of 2 weeks, then take 5mg  for 2 weeks and then stop it.   Call us in late May for a June/July appointment.   I appreciate the opportunity to care for you. Stan Head, MD, [facility]

## 2018-10-22 ENCOUNTER — Other Ambulatory Visit: Payer: Self-pay | Admitting: Family Medicine

## 2018-10-26 ENCOUNTER — Ambulatory Visit (INDEPENDENT_AMBULATORY_CARE_PROVIDER_SITE_OTHER): Payer: 59 | Admitting: Family Medicine

## 2018-10-26 ENCOUNTER — Encounter: Payer: Self-pay | Admitting: Family Medicine

## 2018-10-26 DIAGNOSIS — H6690 Otitis media, unspecified, unspecified ear: Secondary | ICD-10-CM

## 2018-10-26 DIAGNOSIS — H66002 Acute suppurative otitis media without spontaneous rupture of ear drum, left ear: Secondary | ICD-10-CM

## 2018-10-26 MED ORDER — AMOXICILLIN-POT CLAVULANATE 875-125 MG PO TABS: 1.0000 | ORAL_TABLET | Freq: Two times a day (BID) | ORAL | 0 refills | Status: DC

## 2018-10-26 NOTE — Progress Notes (Signed)
[name] - 68 y.o. male MRN [id number]  Date of birth: 1951/05/09   This visit type was conducted due to national recommendations for restrictions regarding the COVID-19 Pandemic (e.g. social distancing).  This format is felt to be most appropriate for this patient at this time.  All issues noted in this document were discussed and addressed.  No physical exam was performed (except for noted visual exam findings with Video Visits).  I discussed the limitations of evaluation and management by telemedicine and the availability of in person appointments. The patient expressed understanding and agreed to proceed.  I connected with@ on 10/26/18 at 10:20 AM EDT by a video enabled telemedicine application and verified that I am speaking with the correct person using two identifiers.   Patient Location: Home [address] [place] [address]   Provider location:   [facility]  Chief Complaint  Patient presents with  . 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 male who presents via audio/video conferencing for a telehealth visit today.  He has complaint of L sided ear pain.  He reports that he has had congestion and ear fullness related to allergies for the past couple of weeks.  Using flonase and also tapering off of prednisone for Crohn's.  Pain began about 5-7 days ago, throbbing in nature. Has continued to worsen since that time. Denies drainage from the ear.  Has had enlarged lymph node on L side as well.  He denies fever, chills, sore throat, sinus pain, dizziness, nausea, vomiting, diarrhea.  He is on stelara as well.   ROS:  A comprehensive ROS was completed and negative except as noted per HPI  Past Medical History:  Diagnosis Date  . Acute prostatitis 07/24/2007   Qualifier: Diagnosis of  By: Clent Ridges MD, Tera Mater   . Allergy    mild  . Arthritis    osteoarthritis  . Asthma   . Blood transfusion without reported diagnosis   . BPH (benign prostatic hypertrophy) with urinary obstruction   . Crohn's ileitis ([facility]) suspected 05/03/2017  . Dilated aortic root ([facility])    noted on echo 08/2012  . Diverticulitis of colon   . EPIDIDYMITIS 02/15/2010   Qualifier: Diagnosis of  By: Clent Ridges MD, Tera Mater   . GERD (gastroesophageal reflux disease)   . H/O: GI bleed   . Hemorrhoids   . Hepatitis 1975   unknown type   . HERPES SIMPLEX INFECTION 10/14/2007   Qualifier: Diagnosis of  By: Clent Ridges MD, Tera Mater   . Hiatal hernia   . Ileus following gastrointestinal surgery ([facility]) 12/26/2011  . Psoriasis    sees [facility]. Karene Fry at [facility].  . Recurrent ventral incisional hernia 05/10/2012  . SVT (supraventricular tachycardia) ([facility])   . Ulcer 08/21/2013   ileal    Past Surgical History:  Procedure Laterality Date  . BOWEL RESECTION  12/19/2011   Procedure: SMALL BOWEL RESECTION;  Surgeon: Mariella Saa, MD;  [facility];  Service: General;  Laterality: N/A;  with anastamosis and insertion mesh  . BRONCHOSCOPY    . COLON SURGERY  01/2004  . COLONOSCOPY W/ BIOPSIES  04/26/2017   per Dr. Leone Payor, no polyps, benign inflammation, repeat in 5 yrs   . CYSTOSCOPY    . ESOPHAGOGASTRODUODENOSCOPY    . HEMICOLECTOMY     left side, at [facility], diverticulitis  .  HEMORRHOID BANDING    . HERNIA REPAIR     [phone] incisional hernia  . ILEOSTOMY    . ILEOSTOMY CLOSURE    . INSERTION OF MESH  07/31/2012   Procedure: INSERTION OF MESH;  Surgeon: Mariella Saa, MD;  [facility];  Service: General;;  . LAPAROTOMY  12/19/2011   Procedure: EXPLORATORY LAPAROTOMY;  Surgeon: Mariella Saa, MD;  [facility];  Service: General;  Laterality: N/A;  . TONSILLECTOMY    . UPPER GASTROINTESTINAL ENDOSCOPY    . VASECTOMY    . VENTRAL HERNIA REPAIR  07/31/2012   Procedure: HERNIA REPAIR VENTRAL ADULT;  Surgeon: Mariella Saa, MD;  [facility];  Service: General;  Laterality: N/A;    Family History  Problem Relation Age of Onset  . Lung cancer Mother   . Leukemia Father   . Hypertension Father   . Heart disease Father   . Heart attack Father   . Prostate cancer Father   . Prostate cancer Paternal Uncle   . Colon cancer Neg Hx   . Stomach cancer Neg Hx   . Colon polyps Neg Hx   . Esophageal cancer Neg Hx   . Rectal cancer Neg Hx     Social History   Socioeconomic History  . Marital status: Married    Spouse name: Not on file  . Number of children: 2  . Years of education: Not on file  . Highest education level: Not on file  Occupational History  . Occupation: EHS Event organiser: Art gallery manager  Social Needs  . Financial resource strain: Not on file  . Food insecurity:    Worry: Not on file    Inability: Not on file  . Transportation needs:    Medical: Not on file    Non-medical: Not on file  Tobacco Use  . Smoking status: Never Smoker  . Smokeless tobacco: Never Used  Substance and Sexual Activity  . Alcohol use: Yes    Alcohol/week: 0.0 standard drinks    Comment: once a month or less  . Drug use: No  . Sexual activity: Yes    Partners: Female  Lifestyle  . Physical activity:    Days per week: Not on file    Minutes per session: Not on file  . Stress: Not on file  Relationships  . Social connections:    Talks on phone: Not on file    Gets together: Not on file    Attends religious service: Not on file    Active member of club or organization: Not on file    Attends meetings of clubs or organizations: Not on file    Relationship status: Not on file  . Intimate partner violence:    Fear of current or ex partner: Not on file    Emotionally abused: Not on file    Physically abused: Not on file    Forced sexual activity: Not on file  Other Topics Concern  . 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  albuterol (PROVENTIL HFA;VENTOLIN HFA) 108 (90 Base) MCG/ACT inhaler, INHALE 2 PUFFS INTO THE LUNGS EVERY 4 (FOUR) HOURS AS NEEDED FOR WHEEZING OR SHORTNESS OF BREATH., Disp: 8.5 Inhaler, Rfl: 2 .  diphenoxylate-atropine (LOMOTIL) 2.5-0.025 MG  tablet, 1-2 tablets 4 times a day as neded (Patient taking differently: Take 1 tablet by mouth 4 (four) times daily as needed for diarrhea or loose stools. ), Disp: 90 tablet, Rfl: 1 .  esomeprazole (NEXIUM) 40 MG capsule, Take 1 capsule (40 mg total) by mouth daily before breakfast., Disp: 30 capsule, Rfl: 2 .  halobetasol (ULTRAVATE) 0.05 % ointment, Apply topically 2 (two) times daily. (Patient taking differently: Apply 1 application topically 2 (two) times daily as needed (for skin infections). ), Disp: 50 g, Rfl: 5 .  hyoscyamine (LEVSIN SL) 0.125 MG SL tablet, PLACE 1 TABLET UNDER THE TONGUE EVERY 4 HOURS AS NEEDED FOR CRAMPING (URGENT DEFECATION) (Patient taking differently: Take 0.125 mg by mouth every 4 (four) hours as needed for cramping. ), Disp: 60 tablet, Rfl: 2 .  ketoconazole (NIZORAL) 2 % cream, Apply 1 application topically 2 (two) times daily as needed (fungal infections)., Disp: 30 g, Rfl: 2 .  Multiple Vitamin (MULTIVITAMIN WITH MINERALS) TABS, Take 1 tablet by mouth daily., Disp: , Rfl:  .  Multiple Vitamins-Minerals (IMMUNE SUPPORT VITAMIN C PO), Take 1 tablet by mouth daily., Disp: , Rfl:  .  predniSONE (DELTASONE) 10 MG tablet, Take 1 tablet (10 mg total) by mouth daily with breakfast., Disp: 90 tablet, Rfl: 0 .  tadalafil (CIALIS) 5 MG tablet, Take 1 tablet (5 mg total) by mouth daily. (Patient taking differently: Take 5 mg by mouth daily as needed for erectile dysfunction. ), Disp: 90 tablet, Rfl: 3 .  traZODone (DESYREL) 100 MG tablet, Take 1 tablet (100 mg total) by mouth at bedtime. (Patient taking differently: Take 50 mg by mouth at bedtime. ), Disp: 10  tablet, Rfl: 0 .  ustekinumab (STELARA) 90 MG/ML SOSY injection, Inject 1 mL (90 mg total) into the skin every 8 (eight) weeks., Disp: 0.84 mL, Rfl: 6 .  amoxicillin-clavulanate (AUGMENTIN) 875-125 MG tablet, Take 1 tablet by mouth 2 (two) times daily., Disp: 20 tablet, Rfl: 0  EXAM:  VITALS per patient if applicable: Temp (!) 97.3 F (36.3 C) (Temporal) Comment: pt took @ hm  Wt 240 lb (108.9 kg) Comment: pt took @ hm  BMI 33.95 kg/m   GENERAL: alert, oriented, appears well and in no acute distress  HEENT: atraumatic, conjunttiva clear, no obvious abnormalities on inspection of external nose and ears  NECK: normal movements of the head and neck  LUNGS: on inspection no signs of respiratory distress, breathing rate appears normal, no obvious gross SOB  CV: no obvious cyanosis  MS: moves all visible extremities without noticeable abnormality  PSYCH/NEURO: pleasant and cooperative, no obvious depression or anxiety, speech and thought processing grossly intact  ASSESSMENT AND PLAN:  Discussed the following assessment and plan:  Otitis media -given chronic prednisone and stelara use he is at higher risk of developing infections. Will treat with augmentin x10 days.  -Instructed to push fluids -May do warm compress for comfort.  -Tylenol for pain or fever.  -Call or f/u for worsening symptoms or if not improving.        I discussed the assessment and treatment plan with the patient. The patient was provided an opportunity to ask questions and all were answered. The patient agreed with the plan and demonstrated an understanding of the instructions.   The patient was advised to call back or seek an in-person evaluation if the symptoms worsen or if the condition fails to improve as anticipated.    Everrett Coombeody Equilla Que, DO

## 2018-10-26 NOTE — Assessment & Plan Note (Signed)
-  given chronic prednisone and stelara use he is at higher risk of developing infections. Will treat with augmentin x10 days.  -Instructed to push fluids -May do warm compress for comfort.  -Tylenol for pain or fever.  -Call or f/u for worsening symptoms or if not improving.

## 2018-11-09 ENCOUNTER — Encounter (HOSPITAL_BASED_OUTPATIENT_CLINIC_OR_DEPARTMENT_OTHER): Payer: Self-pay | Admitting: *Deleted

## 2018-11-09 ENCOUNTER — Other Ambulatory Visit: Payer: Self-pay

## 2018-11-09 ENCOUNTER — Emergency Department (HOSPITAL_BASED_OUTPATIENT_CLINIC_OR_DEPARTMENT_OTHER)
Admission: EM | Admit: 2018-11-09 | Discharge: 2018-11-09 | Disposition: A | Discharge status: Home / Self Care | Payer: 59 | Attending: Emergency Medicine | Admitting: Emergency Medicine

## 2018-11-09 DIAGNOSIS — J45909 Unspecified asthma, uncomplicated: Secondary | ICD-10-CM | POA: Diagnosis not present

## 2018-11-09 DIAGNOSIS — Z79899 Other long term (current) drug therapy: Secondary | ICD-10-CM | POA: Insufficient documentation

## 2018-11-09 DIAGNOSIS — T7840XA Allergy, unspecified, initial encounter: Secondary | ICD-10-CM

## 2018-11-09 MED ORDER — PREDNISONE 20 MG PO TABS
40.0000 mg | ORAL_TABLET | Freq: Once | ORAL | Status: AC
  Administered 2018-11-09: 18:00:00 40 mg via ORAL
  Filled 2018-11-09: qty 2

## 2018-11-09 MED ORDER — EPINEPHRINE 0.3 MG/0.3ML IJ SOAJ: 0.3000 mg | INTRAMUSCULAR | 0 refills | Status: DC | PRN

## 2018-11-09 MED ORDER — PREDNISONE 20 MG PO TABS: ORAL_TABLET | ORAL | 0 refills | Status: DC

## 2018-11-09 NOTE — Discharge Instructions (Addendum)
If you develop severe worsening throat closing sensation, trouble breathing or swallowing, or any other new/concerning symptoms then call 911 or return to the ER for evaluation.  Due to the symptoms, we are increasing her steroid dose, call your doctor, Dr. Leone Payor, on Monday 4/27 to adjust your steroids after this burst.

## 2018-11-09 NOTE — ED Provider Notes (Signed)
[facility] EMERGENCY DEPARTMENT Provider Note   CSN: [id number] Arrival date & time: 11/09/18  1720    History   Chief Complaint Chief Complaint  Patient presents with  . Allergic Reaction    HPI STACI EICHENAUER is a 68 y.o. male.     HPI  68 year old male presents with concern for allergic reaction.  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  Throughout the day today he has been having itching throughout his body without rash.  Then about 2 hours ago he started feeling like his throat was tight and he was having trouble swallowing.  He took 2 Benadryl and shortly thereafter fell asleep.  When he woke up his symptoms were better but not gone.  No trouble breathing or coughing.  No fever.  Went to urgent care and they told him they did not have the medicine they needed to give him, but is not sure what it was.  He is chronically on 10 mg prednisone as he is being slowly tapered off by his gastroenterologist for Crohn's disease.  He does not think he has been around anything that would typically cause an allergic reaction for him.  Past Medical History:  Diagnosis Date  . Acute prostatitis 07/24/2007   Qualifier: Diagnosis of  By: Clent Ridges MD, Tera Mater   . Allergy    mild  . Arthritis    osteoarthritis  . Asthma   . Blood transfusion without reported diagnosis   . BPH (benign prostatic hypertrophy) with urinary obstruction   . Crohn's ileitis (HCC) suspected 05/03/2017  . Dilated aortic root (HCC)    noted on echo 08/2012  . Diverticulitis of colon   . EPIDIDYMITIS 02/15/2010   Qualifier: Diagnosis of  By: Clent Ridges MD, Tera Mater   . GERD (gastroesophageal reflux disease)   . H/O: GI bleed   . Hemorrhoids   . Hepatitis 1975   unknown type   . HERPES SIMPLEX INFECTION 10/14/2007   Qualifier: Diagnosis of  By: Clent Ridges MD, Tera Mater   . Hiatal hernia   . Ileus following gastrointestinal surgery  (HCC) 12/26/2011  . Psoriasis    sees Dr. Karene Fry at [facility].  . Recurrent ventral incisional hernia 05/10/2012  . SVT (supraventricular tachycardia) (HCC)   . Ulcer 08/21/2013   ileal    Patient Active Problem List   Diagnosis Date Noted  . Otitis media 10/26/2018  . Abdominal pain, epigastric 10/03/2018  . Long term (current) use of systemic steroids 06/18/2018  . Drug-induced acute pancreatitis without infection or necrosis - from 6 MP 09/24/2017  . Long-term use of immunosuppressant medication  - Stelara 06/28/2017  . Crohn's ileitis (HCC)  05/03/2017  . Psoriasis 08/23/2016  . Insomnia 04/22/2014  . Pruritus ani 08/05/2013  . 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/06/2012  . SVT (supraventricular tachycardia) (HCC) 08/04/2012  . Vitamin D deficiency 06/18/2012  . ADHD 05/28/2009  . PELVIC PAIN, CHRONIC 05/28/2009  . BENIGN PROSTATIC HYPERTROPHY 07/24/2007  . ASTHMA 03/06/2007  . GERD 03/06/2007  . DIVERTICULITIS, HX OF 03/06/2007    Past Surgical History:  Procedure Laterality Date  . BOWEL RESECTION  12/19/2011   Procedure: SMALL BOWEL RESECTION;  Surgeon: Mariella Saa, MD;  Location: [facility];  Service: General;  Laterality: N/A;  with anastamosis and insertion mesh  . BRONCHOSCOPY    . COLON SURGERY  01/2004  . COLONOSCOPY  W/ BIOPSIES  04/26/2017   per Dr. Leone PayorGessner, no polyps, benign inflammation, repeat in 5 yrs   . CYSTOSCOPY    . ESOPHAGOGASTRODUODENOSCOPY    . HEMICOLECTOMY     [facility] divert[facility]  . HEMORRHOID BANDING    . HERNIA REPAIR     [phone] incisional hernia  . ILEOSTOMY    . ILEOSTOMY CLOSURE    . INSERTION OF MESH  07/31/2012   Procedure: INSERTION OF MESH;  Surgeon: Mariella SaaBenjamin T Hoxworth, MD;  Location: [facility];  Service: General;;  . LAPAROTOMY  12/19/2011   Procedure: EXPLORATORY LAPAROTOMY;  Surgeon: Mariella SaaBenjamin T Hoxworth, MD;  Location: [facility];  Service: General;  Laterality: N/A;  . TONSILLECTOMY    . UPPER GASTROINTESTINAL  ENDOSCOPY    . VASECTOMY    . VENTRAL HERNIA REPAIR  07/31/2012   Procedure: HERNIA REPAIR VENTRAL ADULT;  Surgeon: Mariella SaaBenjamin T Hoxworth, MD;  Location: [facility];  Service: General;  Laterality: N/A;        Home Medications    Prior to Admission medications   Medication Sig Start Date End Date Taking? Authorizing Provider  albuterol (PROVENTIL HFA;VENTOLIN HFA) 108 (90 Base) MCG/ACT inhaler INHALE 2 PUFFS INTO THE LUNGS EVERY 4 (FOUR) HOURS AS NEEDED FOR WHEEZING OR SHORTNESS OF BREATH. 10/24/18   Nelwyn SalisburyFry, Stephen A, MD  amoxicillin-clavulanate (AUGMENTIN) 875-125 MG tablet Take 1 tablet by mouth 2 (two) times daily. 10/26/18   Everrett CoombeMatthews, Cody, DO  diphenoxylate-atropine (LOMOTIL) 2.5-0.025 MG tablet 1-2 tablets 4 times a day as neded Patient taking differently: Take 1 tablet by mouth 4 (four) times daily as needed for diarrhea or loose stools.  05/06/18   Iva BoopGessner, Carl E, MD  EPINEPHrine (EPIPEN 2-PAK) 0.3 mg/0.3 mL IJ SOAJ injection Inject 0.3 mLs (0.3 mg total) into the muscle as needed for anaphylaxis. 11/09/18   Pricilla LovelessGoldston, Ezinne Yogi, MD  esomeprazole (NEXIUM) 40 MG capsule Take 1 capsule (40 mg total) by mouth daily before breakfast. 09/07/18   Iva BoopGessner, Carl E, MD  halobetasol (ULTRAVATE) 0.05 % ointment Apply topically 2 (two) times daily. Patient taking differently: Apply 1 application topically 2 (two) times daily as needed (for skin infections).  01/08/18   Nelwyn SalisburyFry, Stephen A, MD  hyoscyamine (LEVSIN SL) 0.125 MG SL tablet PLACE 1 TABLET UNDER THE TONGUE EVERY 4 HOURS AS NEEDED FOR CRAMPING (URGENT DEFECATION) Patient taking differently: Take 0.125 mg by mouth every 4 (four) hours as needed for cramping.  10/19/17   Iva BoopGessner, Carl E, MD  ketoconazole (NIZORAL) 2 % cream Apply 1 application topically 2 (two) times daily as needed (fungal infections). 01/08/18   Nelwyn SalisburyFry, Stephen A, MD  Multiple Vitamin (MULTIVITAMIN WITH MINERALS) TABS Take 1 tablet by mouth daily.    [provider]  Multiple  Vitamins-Minerals (IMMUNE SUPPORT VITAMIN C PO) Take 1 tablet by mouth daily.    [provider]  predniSONE (DELTASONE) 10 MG tablet Take 1 tablet (10 mg total) by mouth daily with breakfast. 09/23/18   Iva BoopGessner, Carl E, MD  predniSONE (DELTASONE) 20 MG tablet 2 tabs po daily x 4 days 11/10/18   Pricilla LovelessGoldston, Aran Menning, MD  tadalafil (CIALIS) 5 MG tablet Take 1 tablet (5 mg total) by mouth daily. Patient taking differently: Take 5 mg by mouth daily as needed for erectile dysfunction.  11/23/17   Nelwyn SalisburyFry, Stephen A, MD  traZODone (DESYREL) 100 MG tablet Take 1 tablet (100 mg total) by mouth at bedtime. Patient taking differently: Take 50 mg by mouth at bedtime.  11/26/15   [name], MD  ustekinumab Marcy Panning) 90 MG/ML SOSY injection Inject 1 mL (90 mg total) into the skin every 8 (eight) weeks. 08/02/18   Iva Boop, MD    Family History Family History  Problem Relation Age of Onset  . Lung cancer Mother   . Leukemia Father   . Hypertension Father   . Heart disease Father   . Heart attack Father   . Prostate cancer Father   . Prostate cancer Paternal Uncle   . Colon cancer Neg Hx   . Stomach cancer Neg Hx   . Colon polyps Neg Hx   . Esophageal cancer Neg Hx   . Rectal cancer Neg Hx     Social History Social History   Tobacco Use  . Smoking status: Never Smoker  . Smokeless tobacco: Never Used  Substance Use Topics  . Alcohol use: Yes    Alcohol/week: 0.0 standard drinks    Comment: once a month or less  . Drug use: No     Allergies   Mercaptopurine and Shellfish allergy   Review of Systems Review of Systems  HENT: Positive for sore throat and trouble swallowing.   Respiratory: Negative for cough and shortness of breath.   Gastrointestinal: Negative for vomiting.  Skin: Negative for rash.  All other systems reviewed and are negative.    Physical Exam Updated Vital Signs BP (!) 174/85 (BP Location: Right Arm)   Pulse 63   Temp 97.8 F (36.6 C) (Oral)   Resp  18   Ht  (1.778 m)   Wt 104.3 kg   SpO2 98%   BMI 33.00 kg/m   Physical Exam Vitals signs and nursing note reviewed.  Constitutional:      General: He is not in acute distress.    Appearance: He is well-developed. He is obese. He is not ill-appearing or diaphoretic.  HENT:     Head: Normocephalic and atraumatic.     Comments: Normal voice/phonation.  No stridor.  No distress.  Speaks in clear sentences.  No obvious abnormalities on oropharyngeal exam. No lip/tongue swelling.    Right Ear: External ear normal.     Left Ear: External ear normal.     Nose: Nose normal.     Mouth/Throat:     Pharynx: Oropharynx is clear. No oropharyngeal exudate or posterior oropharyngeal erythema.  Eyes:     General:        Right eye: No discharge.        Left eye: No discharge.  Neck:     Musculoskeletal: Neck supple.  Cardiovascular:     Rate and Rhythm: Normal rate and regular rhythm.     Heart sounds: Normal heart sounds.  Pulmonary:     Effort: Pulmonary effort is normal.     Breath sounds: Normal breath sounds. No stridor. No wheezing or rales.  Abdominal:     General: There is no distension.  Skin:    General: Skin is warm and dry.  Neurological:     Mental Status: He is alert.  Psychiatric:        Mood and Affect: Mood is not anxious.      ED Treatments / Results  Labs (all labs ordered are listed, but only abnormal results are displayed) Labs Reviewed - No data to display  EKG None  Radiology No results found.  Procedures Procedures (including critical care time)  Medications Ordered in ED Medications  predniSONE (DELTASONE) tablet 40 mg (  has no administration in time range)     Initial Impression / Assessment and Plan / ED Course  I have reviewed the triage vital signs and the nursing notes.  Pertinent labs & imaging results that were available during my care of the patient were reviewed by me and considered in my medical decision making (see chart for  details).        Unclear exact cause but this does sound allergic with the itching and then the throat symptoms.  I will provide him an EpiPen at home in case his symptoms were to worsen.  I discussed that steroids would be a typical treatment in addition to taking Benadryl at home.  While he is currently being weaned down, I offered multiple options, including starting him up higher around 40 mg and tapering again versus giving him a typical burst and before this is done to call his gastroenterologist to start tapering again.  He prefers to do this option.  He appears quite well and I have low suspicion for an acute airway emergency.  We discussed return precautions.  Final Clinical Impressions(s) / ED Diagnoses   Final diagnoses:  Allergic reaction, initial encounter    ED Discharge Orders         Ordered    predniSONE (DELTASONE) 20 MG tablet     11/09/18 1752    EPINEPHrine (EPIPEN 2-PAK) 0.3 mg/0.3 mL IJ SOAJ injection  As needed     11/09/18 1753           Pricilla Loveless, MD 11/09/18 1758

## 2018-11-09 NOTE — ED Triage Notes (Signed)
Pt reports feeling like he is having an allergic reaction. Throat felt swollen and he states he was very itchy. He states he has a shellfish allergy and it felt similar to that. He took 2 benadryl earlier today and zyrtec. He is on daily prednisone. After taking a nap he didn't feel any better so he went Urgent Care and they sent him here

## 2019-01-06 ENCOUNTER — Telehealth: Payer: Self-pay | Admitting: Internal Medicine

## 2019-01-06 NOTE — Telephone Encounter (Signed)
Heather from [facility] rheumatology called in regards to patient. She stated that he gets the stelara injections however has to do himself and patient do not feel comfortable doing it himself. He has been going to them to get the injection however since its not infusion they want to know If the patient can come to the office to get the injection? Please call can advise thanks.

## 2019-01-13 NOTE — Telephone Encounter (Signed)
I spoke with the patient and [facility] patient cannot get his injection there.  I spoke with the patient.  He is fine injecting at home.  He will call me back if he has questions or problems when it is time for the next injection in 8 weeks.  He is directed to the [facility]/ Stelara injection resources.  He will call for questions.

## 2019-01-13 NOTE — Telephone Encounter (Signed)
Left message for [name] to call back.

## 2019-01-19 ENCOUNTER — Ambulatory Visit (INDEPENDENT_AMBULATORY_CARE_PROVIDER_SITE_OTHER): Payer: 59

## 2019-01-19 ENCOUNTER — Telehealth: Payer: Self-pay

## 2019-01-19 ENCOUNTER — Other Ambulatory Visit: Payer: Self-pay

## 2019-01-19 ENCOUNTER — Ambulatory Visit (INDEPENDENT_AMBULATORY_CARE_PROVIDER_SITE_OTHER)
Admission: RE | Admit: 2019-01-19 | Discharge: 2019-01-19 | Disposition: A | Discharge status: Home / Self Care | Payer: 59 | Source: Ambulatory Visit

## 2019-01-19 ENCOUNTER — Ambulatory Visit (HOSPITAL_COMMUNITY)
Admission: EM | Admit: 2019-01-19 | Discharge: 2019-01-19 | Disposition: A | Discharge status: Home / Self Care | Payer: 59 | Attending: Family Medicine | Admitting: Family Medicine

## 2019-01-19 ENCOUNTER — Encounter (HOSPITAL_COMMUNITY): Payer: Self-pay

## 2019-01-19 DIAGNOSIS — R6889 Other general symptoms and signs: Secondary | ICD-10-CM | POA: Diagnosis not present

## 2019-01-19 DIAGNOSIS — R0789 Other chest pain: Secondary | ICD-10-CM

## 2019-01-19 DIAGNOSIS — R9431 Abnormal electrocardiogram [ECG] [EKG]: Secondary | ICD-10-CM

## 2019-01-19 DIAGNOSIS — Z20822 Contact with and (suspected) exposure to covid-19: Secondary | ICD-10-CM

## 2019-01-19 DIAGNOSIS — R5383 Other fatigue: Secondary | ICD-10-CM

## 2019-01-19 DIAGNOSIS — Z20828 Contact with and (suspected) exposure to other viral communicable diseases: Secondary | ICD-10-CM

## 2019-01-19 NOTE — ED Provider Notes (Addendum)
Virtual Visit via Video Note:  Marcelino Duster  initiated request for Telemedicine visit with [facility] Urgent Care team. I connected with Marcelino Duster  on 01/19/2019 at 10:35 AM  for a synchronized telemedicine visit using a video enabled HIPPA compliant telemedicine application. I verified that I am speaking with Marcelino Duster  using two identifiers. [place] Hall-Potvin, PA-C  was physically located in a [facility] site and WILMON CONOVER was located at a different location.   The limitations of evaluation and management by telemedicine as well as the availability of in-person appointments were discussed. Patient was informed that he  may incur a bill ( including co-pay) for this virtual visit encounter. Razi C Lords  expressed understanding and gave verbal consent to proceed with virtual visit.     History of Present Illness:[name]  is a 68 y.o. male presents with acute concern for cover testing.  Patient states that he has had a 4-day history of nearly constant chest pressure.  Patient does have history of GERD, dilated aortic root, asthma.  Patient does have albuterol inhaler at home which she is used with some relief.  Patient endorses fatigue and decreased appetite.  Denies fever, abdominal pain, shortness of breath, nausea, vomiting, diarrhea, known sick contacts.  Patient states his episodes of SVT were after major surgeries.  States that he was worked up by cardiology without recommended follow-up or intervention.  Past Medical History:  Diagnosis Date  . Acute prostatitis 07/24/2007   Qualifier: Diagnosis of  By: [name] MD, [name]   . Allergy    mild  . Arthritis    osteoarthritis  . Asthma   . Blood transfusion without reported diagnosis   . BPH (benign prostatic hypertrophy) with urinary obstruction   . Crohn's ileitis ([place]) suspected 05/03/2017  . Dilated aortic root ([place])    noted on echo 08/2012  . Diverticulitis of colon   . EPIDIDYMITIS 02/15/2010   Qualifier: Diagnosis of  By: [name] MD, [name]   . GERD (gastroesophageal reflux disease)   . H/O: GI bleed   . Hemorrhoids   . Hepatitis 1975   unknown type   . HERPES SIMPLEX INFECTION 10/14/2007   Qualifier: Diagnosis of  By: [name] MD, [name]   . Hiatal hernia   . Ileus following gastrointestinal surgery ([employer]) 12/26/2011  . Psoriasis    sees [name] at [facility].  . Recurrent ventral incisional hernia 05/10/2012  . SVT (supraventricular tachycardia) ([place])   . Ulcer 08/21/2013   ileal    Allergies  Allergen Reactions  . Mercaptopurine Other (See Comments)    Pancreatitis   . Shellfish Allergy Anaphylaxis    Throat started to close        Observations/Objective: 68 year old male sitting in no acute distress.  Patient is able to complete full sentences without coughing, wheezing.  Assessment and Plan: 68 year old male presenting for cover testing.  Patient's chief complaint is mid to lower chest pressure with associated weakness, fatigue, decreased appetite.  States this is different from his asthma exacerbations and GERD flares.  Discussed virtual visit is not appropriate for thorough assessment.  Patient is agreeable to coming in for office visit.  Follow Up Instructions: Patient to come in to urgent care for further evaluation including EKG, vitals.   I discussed the assessment and treatment plan with the patient. The patient was provided an opportunity to ask questions and all were answered. The patient agreed  with the plan and demonstrated an understanding of the instructions.   The patient was advised to call back or seek an in-person evaluation if the symptoms worsen or if the condition fails to improve as anticipated.  I provided 10 minutes of non-face-to-face time during this encounter.    [place] Hall-Potvin, PA-C  01/19/2019 10:35 AM        Hall-Potvin, [place], PA-C 01/19/19 1049    Hall-Potvin, [place], [place] 01/19/19 1523

## 2019-01-19 NOTE — Telephone Encounter (Signed)
-----   Message from [place], [place] sent at 01/19/2019 12:40 PM EDT ----- Regarding: needs covid testing [place] sx, chest discomfort/congestion, beach last week.

## 2019-01-19 NOTE — Discharge Instructions (Addendum)
Come to urgent care for further evaluation.

## 2019-01-19 NOTE — ED Triage Notes (Signed)
Pt C/O cough with chest congestion and fatigue. Symptoms started 4 days ago. Pt states he tried OTC medication with no relief.

## 2019-01-19 NOTE — ED Provider Notes (Signed)
[facility]    CSN: [id number] Arrival date & time: 01/19/19  1114     History   Chief Complaint Chief Complaint  Patient presents with  . Cough  . Fatigue  . Nasal Congestion    HPI [name] is a 68 y.o. male evaluated earlier today via virtual visit, presenting in office for further work-up of chest discomfort, fatigue, concern for COVID.  HPI from virtual visit with this provider, as below: "Patient states that he has had a 4-day history of nearly constant chest pressure.  Patient does have history of GERD, dilated aortic root, asthma.  Patient does have albuterol inhaler at home which she is used with some relief.  Patient endorses fatigue and decreased appetite.  Denies fever, abdominal pain, shortness of breath, nausea, vomiting, diarrhea, known sick contacts.  Patient states his episodes of SVT were after major surgeries.  States that he was worked up by cardiology without recommended follow-up or intervention."  Upon further questioning, patient states that he feels as though he has chest congestion.  Patient denies feeling as though someone sitting on his chest, sharp shooting pain, inability to take deep breaths.  Of note, patient states that he would like covid testing due to recent travel to the beach last week.  Did not go to area of high population/was a private beach.  Attempted to socially distance with his wife while they were there.  No known sick contacts.    Past Medical History:  Diagnosis Date  . Acute prostatitis 07/24/2007   Qualifier: Diagnosis of  By: Clent RidgesFry MD, Tera MaterStephen A   . Allergy    mild  . Arthritis    osteoarthritis  . Asthma   . Blood transfusion without reported diagnosis   . BPH (benign prostatic hypertrophy) with urinary obstruction   . Crohn's ileitis (HCC) suspected 05/03/2017  . Dilated aortic root (HCC)    noted on echo 08/2012  . Diverticulitis of colon   . EPIDIDYMITIS 02/15/2010   Qualifier: Diagnosis of  By: Clent RidgesFry MD,  Tera MaterStephen A   . GERD (gastroesophageal reflux disease)   . H/O: GI bleed   . Hemorrhoids   . Hepatitis 1975   unknown type   . HERPES SIMPLEX INFECTION 10/14/2007   Qualifier: Diagnosis of  By: Clent RidgesFry MD, Tera MaterStephen A   . Hiatal hernia   . Ileus following gastrointestinal surgery (HCC) 12/26/2011  . Psoriasis    [facility]Matthew [facility]  . Recurrent ventral incisional hernia 05/10/2012  . SVT (supraventricular tachycardia) (HCC)   . Ulcer 08/21/2013   ileal    Patient Active Problem List   Diagnosis Date Noted  . Otitis media 10/26/2018  . Abdominal pain, epigastric 10/03/2018  . Long term (current) use of systemic steroids 06/18/2018  . Drug-induced acute pancreatitis without infection or necrosis - from 6 MP 09/24/2017  . Long-term use of immunosuppressant medication  - Stelara 06/28/2017  . Crohn's ileitis (HCC)  05/03/2017  . Psoriasis 08/23/2016  . Insomnia 04/22/2014  . Pruritus ani 08/05/2013  . 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/06/2012  . SVT (supraventricular tachycardia) (HCC) 08/04/2012  . Vitamin D deficiency 06/18/2012  . ADHD 05/28/2009  . PELVIC PAIN, CHRONIC 05/28/2009  . BENIGN PROSTATIC HYPERTROPHY 07/24/2007  . ASTHMA 03/06/2007  . GERD 03/06/2007  . DIVERTICULITIS, HX OF 03/06/2007    Past Surgical History:  Procedure Laterality Date  . BOWEL RESECTION  12/19/2011   Procedure: SMALL BOWEL RESECTION;  Surgeon: Sharlet SalinaBenjamin T  Hoxworth, MD;  Location: [facility];  Service: General;  Laterality: N/A;  with anastamosis and insertion mesh  . BRONCHOSCOPY    . COLON SURGERY  01/2004  . COLONOSCOPY W/ BIOPSIES  04/26/2017   per Dr. Leone PayorGessner, no polyps, benign inflammation, repeat in 5 yrs   . CYSTOSCOPY    . ESOPHAGOGASTRODUODENOSCOPY    . HEMICOLECTOMY     [facility], [facility]iculitis  . HEMORRHOID BANDING    . HERNIA REPAIR     [phone] incisional hernia  . ILEOSTOMY    . ILEOSTOMY CLOSURE    . INSERTION OF MESH  07/31/2012   Procedure: INSERTION OF  MESH;  Surgeon: Mariella SaaBenjamin T Hoxworth, MD;  Location: [facility];  Service: General;;  . LAPAROTOMY  12/19/2011   Procedure: EXPLORATORY LAPAROTOMY;  Surgeon: Mariella SaaBenjamin T Hoxworth, MD;  Location: [facility];  Service: General;  Laterality: N/A;  . TONSILLECTOMY    . UPPER GASTROINTESTINAL ENDOSCOPY    . VASECTOMY    . VENTRAL HERNIA REPAIR  07/31/2012   Procedure: HERNIA REPAIR VENTRAL ADULT;  Surgeon: Mariella SaaBenjamin T Hoxworth, MD;  Location: [facility];  Service: General;  Laterality: N/A;       Home Medications    Prior to Admission medications   Medication Sig Start Date End Date Taking? Authorizing Provider  albuterol (PROVENTIL HFA;VENTOLIN HFA) 108 (90 Base) MCG/ACT inhaler INHALE 2 PUFFS INTO THE LUNGS EVERY 4 (FOUR) HOURS AS NEEDED FOR WHEEZING OR SHORTNESS OF BREATH. 10/24/18   Nelwyn SalisburyFry, Stephen A, MD  amoxicillin-clavulanate (AUGMENTIN) 875-125 MG tablet Take 1 tablet by mouth 2 (two) times daily. 10/26/18   Everrett CoombeMatthews, Cody, DO  diphenoxylate-atropine (LOMOTIL) 2.5-0.025 MG tablet 1-2 tablets 4 times a day as neded Patient taking differently: Take 1 tablet by mouth 4 (four) times daily as needed for diarrhea or loose stools.  05/06/18   Iva BoopGessner, Carl E, MD  EPINEPHrine (EPIPEN 2-PAK) 0.3 mg/0.3 mL IJ SOAJ injection Inject 0.3 mLs (0.3 mg total) into the muscle as needed for anaphylaxis. 11/09/18   Pricilla LovelessGoldston, Scott, MD  esomeprazole (NEXIUM) 40 MG capsule Take 1 capsule (40 mg total) by mouth daily before breakfast. 09/07/18   Iva BoopGessner, Carl E, MD  halobetasol (ULTRAVATE) 0.05 % ointment Apply topically 2 (two) times daily. Patient taking differently: Apply 1 application topically 2 (two) times daily as needed (for skin infections).  01/08/18   Nelwyn SalisburyFry, Stephen A, MD  hyoscyamine (LEVSIN SL) 0.125 MG SL tablet PLACE 1 TABLET UNDER THE TONGUE EVERY 4 HOURS AS NEEDED FOR CRAMPING (URGENT DEFECATION) Patient taking differently: Take 0.125 mg by mouth every 4 (four) hours as needed for cramping.  10/19/17   Iva BoopGessner, Carl E, MD   ketoconazole (NIZORAL) 2 % cream Apply 1 application topically 2 (two) times daily as needed (fungal infections). 01/08/18   Nelwyn SalisburyFry, Stephen A, MD  Multiple Vitamin (MULTIVITAMIN WITH MINERALS) TABS Take 1 tablet by mouth daily.    [provider]  Multiple Vitamins-Minerals (IMMUNE SUPPORT VITAMIN C PO) Take 1 tablet by mouth daily.    [provider]  predniSONE (DELTASONE) 10 MG tablet Take 1 tablet (10 mg total) by mouth daily with breakfast. 09/23/18   Iva BoopGessner, Carl E, MD  predniSONE (DELTASONE) 20 MG tablet 2 tabs po daily x 4 days 11/10/18   Pricilla LovelessGoldston, Scott, MD  tadalafil (CIALIS) 5 MG tablet Take 1 tablet (5 mg total) by mouth daily. Patient taking differently: Take 5 mg by mouth daily as needed for erectile dysfunction.  11/23/17  Nelwyn SalisburyFry, Stephen A, MD  traZODone (DESYREL) 100 MG tablet Take 1 tablet (100 mg total) by mouth at bedtime. Patient taking differently: Take 50 mg by mouth at bedtime.  11/26/15   Nelwyn SalisburyFry, Stephen A, MD  ustekinumab Marcy Panning(STELARA) 90 MG/ML SOSY injection Inject 1 mL (90 mg total) into the skin every 8 (eight) weeks. 08/02/18   Iva BoopGessner, Carl E, MD    Family History Family History  Problem Relation Age of Onset  . Lung cancer Mother   . Leukemia Father   . Hypertension Father   . Heart disease Father   . Heart attack Father   . Prostate cancer Father   . Prostate cancer Paternal Uncle   . Colon cancer Neg Hx   . Stomach cancer Neg Hx   . Colon polyps Neg Hx   . Esophageal cancer Neg Hx   . Rectal cancer Neg Hx     Social History Social History   Tobacco Use  . Smoking status: Never Smoker  . Smokeless tobacco: Never Used  Substance Use Topics  . Alcohol use: Yes    Alcohol/week: 0.0 standard drinks    Comment: once a month or less  . Drug use: No     Allergies   Mercaptopurine and Shellfish allergy   Review of Systems As per HPI   Physical Exam Triage Vital Signs ED Triage Vitals  Enc Vitals Group     BP 01/19/19 1129 117/84      Pulse Rate 01/19/19 1129 64     Resp 01/19/19 1129 18     Temp 01/19/19 1129 97.8 F (36.6 C)     Temp Source 01/19/19 1129 Oral     SpO2 01/19/19 1129 95 %     Weight --      Height --      Head Circumference --      Peak Flow --      Pain Score 01/19/19 1131 4     Pain Loc --      Pain Edu? --      Excl. in GC? --    No data found.  Updated Vital Signs BP 117/84 (BP Location: Left Arm)   Pulse 64   Temp 97.8 F (36.6 C) (Oral)   Resp 18   SpO2 95%   Visual Acuity Right Eye Distance:   Left Eye Distance:   Bilateral Distance:    Right Eye Near:   Left Eye Near:    Bilateral Near:     Physical Exam Constitutional:      General: He is not in acute distress. HENT:     Head: Normocephalic and atraumatic.     Right Ear: Tympanic membrane, ear canal and external ear normal.     Left Ear: Tympanic membrane, ear canal and external ear normal.     Nose: Nose normal.     Mouth/Throat:     Mouth: Mucous membranes are moist.     Pharynx: Oropharynx is clear. No oropharyngeal exudate or posterior oropharyngeal erythema.  Eyes:     General: No scleral icterus.       Right eye: No discharge.        Left eye: No discharge.     Conjunctiva/sclera: Conjunctivae normal.     Pupils: Pupils are equal, round, and reactive to light.  Neck:     Musculoskeletal: Normal range of motion and neck supple. No muscular tenderness.  Cardiovascular:     Rate and Rhythm: Normal rate and regular rhythm.  Pulses: Normal pulses.     Heart sounds: Normal heart sounds.  Pulmonary:     Effort: Pulmonary effort is normal. No respiratory distress.     Breath sounds: No stridor. No wheezing or rhonchi.     Comments: Decreased air movement and breath sounds bilaterally Chest:     Chest wall: No tenderness.  Lymphadenopathy:     Cervical: No cervical adenopathy.  Skin:    General: Skin is warm.     Capillary Refill: Capillary refill takes less than 2 seconds.     Coloration: Skin is not  jaundiced or pale.  Neurological:     General: No focal deficit present.     Mental Status: He is alert and oriented to person, place, and time.  Psychiatric:        Behavior: Behavior normal.        Thought Content: Thought content normal.        Judgment: Judgment normal.      UC Treatments / Results  Labs (all labs ordered are listed, but only abnormal results are displayed) Labs Reviewed - No data to display  EKG   Radiology Dg Chest 2 View  Result Date: 01/19/2019 CLINICAL DATA:  68 year old male with fatigue, chest congestion and cough EXAM: CHEST - 2 VIEW COMPARISON:  Prior chest x-ray 12/26/2011 FINDINGS: The lungs are clear and negative for focal airspace consolidation, pulmonary edema or suspicious pulmonary nodule. No pleural effusion or pneumothorax. Cardiac and mediastinal contours are within normal limits. No acute fracture or lytic or blastic osseous lesions. The visualized upper abdominal bowel gas pattern is unremarkable. IMPRESSION: No active cardiopulmonary disease. Electronically Signed   By: Malachy Moan M.D.   On: 01/19/2019 12:19    Procedures Procedures (including critical care time)  Medications Ordered in UC Medications - No data to display  Initial Impression / Assessment and Plan / UC Course  I have reviewed the triage vital signs and the nursing notes.  Pertinent labs & imaging results that were available during my care of the patient were reviewed by me and considered in my medical decision making (see chart for details).     68 year old male presenting for acute concern of chest discomfort, malaise, COVID testing.  Patient's oxygen saturation lower than baseline at 95%.  No focal findings on lung exam.  Patient denies shortness of breath, chest x-ray done in office.  This was reviewed by radiology: "No active radial pulmonary disease ".  EKG done in office, compared with previous on file from 08/08/2012 by me and [name], [facility]: Normal sinus  rhythm without ST elevation or QTc prolongation.  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  Discussed that patient will need cardiology follow-up for further work-up; instructed to call Monday for appointment.  Patient also going to call his PCP Monday to take them on EKG and COVID testing.  Strict return precautions were discussed, including worsening chest pain, shortness of breath, nausea, vomiting, lightheadedness, fatigue, weakness, slow heart rate.  Patient is not in acute distress, hemodynamically stable.  States he prefers to be discharged home at this time, verbalized understanding of returning precautions and is agreeable to current plan. Final Clinical Impressions(s) / UC Diagnoses   Final diagnoses:  Suspected Covid-19 Virus Infection  Abnormal finding on EKG     Discharge Instructions     Follow-up with your PCP tomorrow via phone, bring EKG with you if appointment recommended. Follow-up with cardiology for further work-up. Important to stay home and isolate until your  cover testing is back. Continue albuterol inhaler to help with breathing. Go to ER for further evaluation if you develop severe chest pain, shortness of breath, nausea, vomiting, lightheadedness, worsening weakness or fatigue.    ED Prescriptions    None     Controlled Substance Prescriptions [place] Controlled Substance Registry consulted? Not Applicable   Quincy Sheehan, [place] 01/19/19 2049

## 2019-01-19 NOTE — Telephone Encounter (Signed)
Pt scheduled 01/20/19 at 3:30 at [facility]. Advised pt to wear mask and to stay in car and address to testing facility. Pt verbalized understanding.

## 2019-01-19 NOTE — Discharge Instructions (Addendum)
Follow-up with your PCP tomorrow via phone, bring EKG with you if appointment recommended. Follow-up with cardiology for further work-up. Important to stay home and isolate until your cover testing is back. Continue albuterol inhaler to help with breathing. Go to ER for further evaluation if you develop severe chest pain, shortness of breath, nausea, vomiting, lightheadedness, worsening weakness or fatigue.

## 2019-01-20 ENCOUNTER — Other Ambulatory Visit: Payer: Self-pay

## 2019-01-20 ENCOUNTER — Encounter: Payer: Self-pay | Admitting: Family Medicine

## 2019-01-20 DIAGNOSIS — Z20822 Contact with and (suspected) exposure to covid-19: Secondary | ICD-10-CM

## 2019-01-20 NOTE — Telephone Encounter (Signed)
FYI for Dr. [name]

## 2019-01-22 ENCOUNTER — Other Ambulatory Visit: Payer: Self-pay

## 2019-01-22 ENCOUNTER — Encounter: Payer: Self-pay | Admitting: Family Medicine

## 2019-01-22 ENCOUNTER — Ambulatory Visit (INDEPENDENT_AMBULATORY_CARE_PROVIDER_SITE_OTHER): Payer: 59 | Admitting: Family Medicine

## 2019-01-22 DIAGNOSIS — R6889 Other general symptoms and signs: Secondary | ICD-10-CM

## 2019-01-22 DIAGNOSIS — J45901 Unspecified asthma with (acute) exacerbation: Secondary | ICD-10-CM | POA: Diagnosis not present

## 2019-01-22 DIAGNOSIS — Z20828 Contact with and (suspected) exposure to other viral communicable diseases: Secondary | ICD-10-CM

## 2019-01-22 DIAGNOSIS — I452 Bifascicular block: Secondary | ICD-10-CM | POA: Diagnosis not present

## 2019-01-22 NOTE — Progress Notes (Signed)
Subjective:    Patient ID: [name], male    DOB: May 28, 1951, 68 y.o.   MRN: [id number]  HPI Virtual Visit via Video Note  I connected with the patient on 01/22/19 at  3:30 PM EDT by a video enabled telemedicine application and verified that I am speaking with the correct person using two identifiers.  Location patient: home Location provider:work or home office Persons participating in the virtual visit: patient, provider  I discussed the limitations of evaluation and management by telemedicine and the availability of in person appointments. The patient expressed understanding and agreed to proceed.   HPI: Here to follow up a visit to urgent care on 01-19-19. For 4 days prior to that he had been experiencing profound fatigue, fevers to 100 degrees, headaches, body aches, chest pressure, SOB, and a dry cough. He has asthma so he was using his albuterol inhaler several times a day with little relief. No NVD. At the UC his lungs were clear on exam and a CXR was clear. His O2 sats were 95%. His EKG was notable for bifascicular block, which had not been seen in 2014 (when he was worked up for SVT).  He was tested for the Covid-19 virus that day and results are still pending. He thinks he was exposed by spending several days with his children and grandchildren in a house at the beach a week before this. He was sent home with instructions to rest and use the inhaler prn. He has been taking Tylenol for fever. He feels the same now as then, no better and no worse.    ROS: See pertinent positives and negatives per HPI.  Past Medical History:  Diagnosis Date  . Acute prostatitis 07/24/2007   Qualifier: Diagnosis of  By: [name] MD, [name]   . Allergy    mild  . Arthritis    osteoarthritis  . Asthma   . Blood transfusion without reported diagnosis   . BPH (benign prostatic hypertrophy) with urinary obstruction   . Crohn's ileitis ([place]) suspected 05/03/2017  . Dilated aortic root ([place])    noted on echo 08/2012  . Diverticulitis of colon   . EPIDIDYMITIS 02/15/2010   Qualifier: Diagnosis of  By: [name] MD, [name]   . GERD (gastroesophageal reflux disease)   . H/O: GI bleed   . Hemorrhoids   . Hepatitis 1975   unknown type   . HERPES SIMPLEX INFECTION 10/14/2007   Qualifier: Diagnosis of  By: [name] MD, [name]   . Hiatal hernia   . Ileus following gastrointestinal surgery ([place]) 12/26/2011  . Psoriasis    sees [name] at [facility]  . Recurrent ventral incisional hernia 05/10/2012  . SVT (supraventricular tachycardia) ([place])   . Ulcer 08/21/2013   ileal    Past Surgical History:  Procedure Laterality Date  . BOWEL RESECTION  12/19/2011   Procedure: SMALL BOWEL RESECTION;  Surgeon: [name], MD;  Location: [facility];  Service: General;  Laterality: N/A;  with anastamosis and insertion mesh  . BRONCHOSCOPY    . COLON SURGERY  01/2004  . COLONOSCOPY W/ BIOPSIES  04/26/2017   per [name], no polyps, benign inflammation, repeat in 5 yrs   . CYSTOSCOPY    . ESOPHAGOGASTRODUODENOSCOPY    . HEMICOLECTOMY     left side, at [facility], diverticulitis  . HEMORRHOID BANDING    . HERNIA REPAIR     [phone] incisional hernia  . ILEOSTOMY    .  ILEOSTOMY CLOSURE    . INSERTION OF MESH  07/31/2012   Procedure: INSERTION OF MESH;  Surgeon: Mariella SaaBenjamin T Hoxworth, MD;  Location[facility];  Service: General;;  . LAPAROTOMY  12/19/2011   Procedure: EXPLORATORY LAPAROTOMY;  Surgeon: Mariella SaaBenjamin T Hoxworth, MD;  Location: [facility];  Service: General;  Laterality: N/A;  . TONSILLECTOMY    . UPPER GASTROINTESTINAL ENDOSCOPY    . VASECTOMY    . VENTRAL HERNIA REPAIR  07/31/2012   Procedure: HERNIA REPAIR VENTRAL ADULT;  Surgeon: Mariella SaaBenjamin T Hoxworth, MD;  Location: [facility];  Service: General;  Laterality: N/A;    Family History  Problem Relation Age of Onset  . Lung cancer Mother   . Leukemia Father   . Hypertension Father   . Heart disease Father   . Heart attack Father   .  Prostate cancer Father   . Prostate cancer Paternal Uncle   . Colon cancer Neg Hx   . Stomach cancer Neg Hx   . Colon polyps Neg Hx   . Esophageal cancer Neg Hx   . Rectal cancer Neg Hx      Current Outpatient Medications:  .  albuterol (PROVENTIL HFA;VENTOLIN HFA) 108 (90 Base) MCG/ACT inhaler, INHALE 2 PUFFS INTO THE LUNGS EVERY 4 (FOUR) HOURS AS NEEDED FOR WHEEZING OR SHORTNESS OF BREATH., Disp: 8.5 Inhaler, Rfl: 2 .  diphenoxylate-atropine (LOMOTIL) 2.5-0.025 MG tablet, 1-2 tablets 4 times a day as neded (Patient taking differently: Take 1 tablet by mouth 4 (four) times daily as needed for diarrhea or loose stools. ), Disp: 90 tablet, Rfl: 1 .  EPINEPHrine (EPIPEN 2-PAK) 0.3 mg/0.3 mL IJ SOAJ injection, Inject 0.3 mLs (0.3 mg total) into the muscle as needed for anaphylaxis., Disp: 1 Device, Rfl: 0 .  esomeprazole (NEXIUM) 40 MG capsule, Take 1 capsule (40 mg total) by mouth daily before breakfast., Disp: 30 capsule, Rfl: 2 .  halobetasol (ULTRAVATE) 0.05 % ointment, Apply topically 2 (two) times daily. (Patient taking differently: Apply 1 application topically 2 (two) times daily as needed (for skin infections). ), Disp: 50 g, Rfl: 5 .  hyoscyamine (LEVSIN SL) 0.125 MG SL tablet, PLACE 1 TABLET UNDER THE TONGUE EVERY 4 HOURS AS NEEDED FOR CRAMPING (URGENT DEFECATION) (Patient taking differently: Take 0.125 mg by mouth every 4 (four) hours as needed for cramping. ), Disp: 60 tablet, Rfl: 2 .  ketoconazole (NIZORAL) 2 % cream, Apply 1 application topically 2 (two) times daily as needed (fungal infections)., Disp: 30 g, Rfl: 2 .  Multiple Vitamins-Minerals (IMMUNE SUPPORT VITAMIN C PO), Take 1 tablet by mouth daily., Disp: , Rfl:  .  tadalafil (CIALIS) 5 MG tablet, Take 1 tablet (5 mg total) by mouth daily. (Patient taking differently: Take 5 mg by mouth daily as needed for erectile dysfunction. ), Disp: 90 tablet, Rfl: 3 .  traZODone (DESYREL) 100 MG tablet, Take 1 tablet (100 mg total) by  mouth at bedtime. (Patient taking differently: Take 50 mg by mouth at bedtime. ), Disp: 10 tablet, Rfl: 0 .  ustekinumab (STELARA) 90 MG/ML SOSY injection, Inject 1 mL (90 mg total) into the skin every 8 (eight) weeks., Disp: 0.84 mL, Rfl: 6  EXAM:  VITALS per patient if applicable:  GENERAL: alert, oriented, appears well and in no acute distress  HEENT: atraumatic, conjunttiva clear, no obvious abnormalities on inspection of external nose and ears  NECK: normal movements of the head and neck  LUNGS: on inspection no signs of respiratory distress, breathing rate appears  normal, no obvious gross SOB, gasping or wheezing  CV: no obvious cyanosis  MS: moves all visible extremities without noticeable abnormality  PSYCH/NEURO: pleasant and cooperative, no obvious depression or anxiety, speech and thought processing grossly intact  ASSESSMENT AND PLAN: He has a viral illness, likely due to the Covid virus. He will continue to quarantine at home while we wait for test results. We will refer him to Cardiology for the heart block.  Gershon CraneStephen Chasya Zenz, MD  Discussed the following assessment and plan:  No diagnosis found.     I discussed the assessment and treatment plan with the patient. The patient was provided an opportunity to ask questions and all were answered. The patient agreed with the plan and demonstrated an understanding of the instructions.   The patient was advised to call back or seek an in-person evaluation if the symptoms worsen or if the condition fails to improve as anticipated.     Review of Systems     Objective:   Physical Exam        Assessment & Plan:

## 2019-01-25 ENCOUNTER — Encounter: Payer: Self-pay | Admitting: Family Medicine

## 2019-01-25 LAB — NOVEL CORONAVIRUS, NAA: SARS-CoV-2, NAA: NOT DETECTED

## 2019-01-27 ENCOUNTER — Ambulatory Visit (INDEPENDENT_AMBULATORY_CARE_PROVIDER_SITE_OTHER): Payer: 59 | Admitting: Family Medicine

## 2019-01-27 ENCOUNTER — Other Ambulatory Visit: Payer: Self-pay

## 2019-01-27 ENCOUNTER — Encounter: Payer: Self-pay | Admitting: Family Medicine

## 2019-01-27 DIAGNOSIS — R6889 Other general symptoms and signs: Secondary | ICD-10-CM

## 2019-01-27 MED ORDER — AZITHROMYCIN 250 MG PO TABS: ORAL_TABLET | ORAL | 0 refills | Status: DC

## 2019-01-27 NOTE — Progress Notes (Signed)
   Subjective:    Patient ID: [name], male    DOB: 15-May-1951, 68 y.o.   MRN: [id number]  HPI Virtual Visit via Telephone Note  I connected with the patient on 01/27/19 at  3:45 PM EDT by telephone and verified that I am speaking with the correct person using two identifiers. We attempted to connect virtually but we had technical difficulties with the audio and video.     I discussed the limitations, risks, security and privacy concerns of performing an evaluation and management service by telephone and the availability of in person appointments. I also discussed with the patient that there may be a patient responsible charge related to this service. The patient expressed understanding and agreed to proceed.  Location patient: home Location provider: work or home office Participants present for the call: patient, provider Patient did not have a visit in the prior 7 days to address this/these issue(s).   History of Present Illness: Here for continued symptoms over the past 10 days of fevers to 100 degrees, body aches, mild headaches, diarrhea, and chest tightness. He has a dry cough from time to time with some mild SOB.  No chest pain. No changer in smell or taste. He had a Covid-19 test on 01-20-19 that came back negative. He has been self quarantining at home. He uses his albuterol inhaler once a day at bedtime. He was recently switched from Humira to [facility] for his Crohn's disease, and he wonders if these are side effects from this medication.    Observations/Objective: Patient sounds cheerful and well on the phone. I do not appreciate any SOB. Speech and thought processing are grossly intact. Patient reported vitals:  Assessment and Plan: He continues to have flu-like symptoms and he has tested negative for the Covid virus. This may be an atypical bronchitis, so we will send in a Zpack for him to take. As for possible Stelara effects, I asked him to get with [name], his GI,  to ask him that question. We wrote a letter to put him out of work from today until 02-03-19. If he is still no better by Friday of this week, I will have him come by our lab to draw an acute respiratory panel and we may send him for a repeat Covid test.  Alysia Penna, MD   Follow Up Instructions:     [phone] 5-10 [phone] 11-20 [id number] 21-30 I did not refer this patient for an OV in the next 24 hours for this/these issue(s).  I discussed the assessment and treatment plan with the patient. The patient was provided an opportunity to ask questions and all were answered. The patient agreed with the plan and demonstrated an understanding of the instructions.   The patient was advised to call back or seek an in-person evaluation if the symptoms worsen or if the condition fails to improve as anticipated.  I provided 16 minutes of non-face-to-face time during this encounter.   Alysia Penna, MD    Review of Systems     Objective:   Physical Exam        Assessment & Plan:

## 2019-01-27 NOTE — Telephone Encounter (Signed)
I had a telephone visit with him today

## 2019-01-28 ENCOUNTER — Encounter: Payer: Self-pay | Admitting: Family Medicine

## 2019-01-28 NOTE — Telephone Encounter (Signed)
Called [name] about this and he said his wife reminded him he had a tick bite about 2 mos ago   This could be a reaction to [place] but he needs evaluation for tick-borne illness and possibly empiric Tx  He will contact Dr. [name] in AM  He needs a f/u me for Crohn's which is a bit better but still w"chronic diarrhea"

## 2019-01-29 NOTE — Telephone Encounter (Signed)
To cover for possible tick borne illness, call in Doxycycline 100 mg bid for 10 days and follow up as needed

## 2019-01-30 ENCOUNTER — Ambulatory Visit (INDEPENDENT_AMBULATORY_CARE_PROVIDER_SITE_OTHER): Payer: 59 | Admitting: Family Medicine

## 2019-01-30 ENCOUNTER — Other Ambulatory Visit: Payer: Self-pay | Admitting: Internal Medicine

## 2019-01-30 ENCOUNTER — Other Ambulatory Visit: Payer: Self-pay

## 2019-01-30 ENCOUNTER — Encounter: Payer: Self-pay | Admitting: Family Medicine

## 2019-01-30 DIAGNOSIS — R6889 Other general symptoms and signs: Secondary | ICD-10-CM | POA: Diagnosis not present

## 2019-01-30 DIAGNOSIS — Z20828 Contact with and (suspected) exposure to other viral communicable diseases: Secondary | ICD-10-CM

## 2019-01-30 DIAGNOSIS — Z20822 Contact with and (suspected) exposure to covid-19: Secondary | ICD-10-CM

## 2019-01-30 LAB — BASIC METABOLIC PANEL
BUN: 12 mg/dL (ref 6–23)
CO2: 24 mEq/L (ref 19–32)
Calcium: 8.9 mg/dL (ref 8.4–10.5)
Chloride: 106 mEq/L (ref 96–112)
Creatinine, Ser: 0.97 mg/dL (ref 0.40–1.50)
GFR: 77.42 mL/min (ref >60.00)
Glucose, Bld: 67 mg/dL — ABNORMAL LOW (ref 70–99)
Potassium: 4.2 mEq/L (ref 3.5–5.1)
Sodium: 139 mEq/L (ref 135–145)

## 2019-01-30 LAB — CBC WITH DIFFERENTIAL/PLATELET
Basophils Absolute: 0 10*3/uL (ref 0.0–0.1)
Basophils Relative: 0.4 % (ref 0.0–3.0)
Eosinophils Absolute: 0.2 10*3/uL (ref 0.0–0.7)
Eosinophils Relative: 2 % (ref 0.0–5.0)
HCT: 47.1 % (ref 39.0–52.0)
Hemoglobin: 15.4 g/dL (ref 13.0–17.0)
Lymphocytes Relative: 33.4 % (ref 12.0–46.0)
Lymphs Abs: 3.1 10*3/uL (ref 0.7–4.0)
MCHC: 32.8 g/dL (ref 30.0–36.0)
MCV: 88.3 fl (ref 78.0–100.0)
Monocytes Absolute: 0.9 10*3/uL (ref 0.1–1.0)
Monocytes Relative: 9.6 % (ref 3.0–12.0)
Neutro Abs: 5.1 10*3/uL (ref 1.4–7.7)
Neutrophils Relative %: 54.6 % (ref 43.0–77.0)
Platelets: 213 10*3/uL (ref 150.0–400.0)
RBC: 5.33 Mil/uL (ref 4.22–5.81)
RDW: 13.6 % (ref 11.5–15.5)
WBC: 9.4 10*3/uL (ref 4.0–10.5)

## 2019-01-30 LAB — HEPATIC FUNCTION PANEL
ALT: 17 U/L (ref 0–53)
AST: 26 U/L (ref 0–37)
Albumin: 4.2 g/dL (ref 3.5–5.2)
Alkaline Phosphatase: 64 U/L (ref 39–117)
Bilirubin, Direct: 0.1 mg/dL (ref 0.0–0.3)
Total Bilirubin: 0.5 mg/dL (ref 0.2–1.2)
Total Protein: 6.4 g/dL (ref 6.0–8.3)

## 2019-01-30 LAB — TSH: TSH: 2.91 u[IU]/mL (ref 0.35–4.50)

## 2019-01-30 NOTE — Progress Notes (Signed)
Subjective:    Patient ID: [name], male    DOB: 25-Nov-1950, 68 y.o.   MRN: [id number]  HPI Virtual Visit via Video Note  I connected with the patient on 01/30/19 at 11:30 AM EDT by a video enabled telemedicine application and verified that I am speaking with the correct person using two identifiers.  Location patient: home Location provider:work or home office Persons participating in the virtual visit: patient, provider  I discussed the limitations of evaluation and management by telemedicine and the availability of in person appointments. The patient expressed understanding and agreed to proceed.   HPI: Here to follow up on an illness that he has had for the past 2 weeks. He has had fevers that hang around 100 to 101 degrees, fatigue, body aches, headache, and some diarrhea. He has also had a dry cough and mild SOB, but no chest pain. He tested negative for the Covid-19 virus a week ago. He has been resting, drinking fluids, and taking Tylenol. We learned a few days ago that he had been bitten by a tick several weeks ago. So we started him on Doxycycline. Today he feels about the same with no improvement. No rashes.    ROS: See pertinent positives and negatives per HPI.  Past Medical History:  Diagnosis Date  . Acute prostatitis 07/24/2007   Qualifier: Diagnosis of  By: [name] MD, [name]   . Allergy    mild  . Arthritis    osteoarthritis  . Asthma   . Blood transfusion without reported diagnosis   . BPH (benign prostatic hypertrophy) with urinary obstruction   . 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 Dilated aortic root ([place])    noted on echo 08/2012  . Diverticulitis of colon   . EPIDIDYMITIS 02/15/2010   Qualifier: Diagnosis of  By: [name] MD, [name]   . GERD (gastroesophageal reflux disease)   . H/O: GI bleed   . Hemorrhoids   . Hepatitis 1975   unknown type   . HERPES SIMPLEX INFECTION 10/14/2007   Qualifier: Diagnosis of  By: [name] MD, [name]   . Hiatal  hernia   . Ileus following gastrointestinal surgery ([place]) 12/26/2011  . Psoriasis    sees [name] at [facility].  . Recurrent ventral incisional hernia 05/10/2012  . SVT (supraventricular tachycardia) ([place])   . Ulcer 08/21/2013   ileal    Past Surgical History:  Procedure Laterality Date  . BOWEL RESECTION  12/19/2011   Procedure: SMALL BOWEL RESECTION;  Surgeon: [name], MD;  Location: [facility];  Service: General;  Laterality: N/A;  with anastamosis and insertion mesh  . BRONCHOSCOPY    . COLON SURGERY  01/2004  . COLONOSCOPY W/ BIOPSIES  04/26/2017   per [name], no polyps, benign inflammation, repeat in 5 yrs   . CYSTOSCOPY    . ESOPHAGOGASTRODUODENOSCOPY    . HEMICOLECTOMY     left side, at [facility], diverticulitis  . HEMORRHOID BANDING    . HERNIA REPAIR     [phone] incisional hernia  . ILEOSTOMY    . ILEOSTOMY CLOSURE    . INSERTION OF MESH  07/31/2012   Procedure: INSERTION OF MESH;  Surgeon: [name], MD;  [facility];  Service: General;;  . LAPAROTOMY  12/19/2011   Procedure: EXPLORATORY LAPAROTOMY;  Surgeon: [name], MD;  [facility];  Service: General;  Laterality: N/A;  . TONSILLECTOMY    . UPPER GASTROINTESTINAL ENDOSCOPY    .  VASECTOMY    . VENTRAL HERNIA REPAIR  07/31/2012   Procedure: HERNIA REPAIR VENTRAL ADULT;  Surgeon: [name], MD;  [facility];  Service: General;  Laterality: N/A;    Family History  Problem Relation Age of Onset  . Lung cancer Mother   . Leukemia Father   . Hypertension Father   . Heart disease Father   . Heart attack Father   . Prostate cancer Father   . Prostate cancer Paternal Uncle   . Colon cancer Neg Hx   . Stomach cancer Neg Hx   . Colon polyps Neg Hx   . Esophageal cancer Neg Hx   . Rectal cancer Neg Hx      Current Outpatient Medications:  .  albuterol (PROVENTIL HFA;VENTOLIN HFA) 108 (90 Base) MCG/ACT inhaler, INHALE 2 PUFFS INTO THE LUNGS EVERY 4  (FOUR) HOURS AS NEEDED FOR WHEEZING OR SHORTNESS OF BREATH., Disp: 8.5 Inhaler, Rfl: 2 .  azithromycin (ZITHROMAX Z-PAK) 250 MG tablet, As directed, Disp: 6 each, Rfl: 0 .  diphenoxylate-atropine (LOMOTIL) 2.5-0.025 MG tablet, 1-2 tablets 4 times a day as neded (Patient taking differently: Take 1 tablet by mouth 4 (four) times daily as needed for diarrhea or loose stools. ), Disp: 90 tablet, Rfl: 1 .  EPINEPHrine (EPIPEN 2-PAK) 0.3 mg/0.3 mL IJ SOAJ injection, Inject 0.3 mLs (0.3 mg total) into the muscle as needed for anaphylaxis., Disp: 1 Device, Rfl: 0 .  esomeprazole (NEXIUM) 40 MG capsule, Take 1 capsule (40 mg total) by mouth daily before breakfast., Disp: 30 capsule, Rfl: 2 .  halobetasol (ULTRAVATE) 0.05 % ointment, Apply topically 2 (two) times daily. (Patient taking differently: Apply 1 application topically 2 (two) times daily as needed (for skin infections). ), Disp: 50 g, Rfl: 5 .  hyoscyamine (LEVSIN SL) 0.125 MG SL tablet, PLACE 1 TABLET UNDER THE TONGUE EVERY 4 HOURS AS NEEDED FOR CRAMPING (URGENT DEFECATION) (Patient taking differently: Take 0.125 mg by mouth every 4 (four) hours as needed for cramping. ), Disp: 60 tablet, Rfl: 2 .  ketoconazole (NIZORAL) 2 % cream, Apply 1 application topically 2 (two) times daily as needed (fungal infections)., Disp: 30 g, Rfl: 2 .  Multiple Vitamins-Minerals (IMMUNE SUPPORT VITAMIN C PO), Take 1 tablet by mouth daily., Disp: , Rfl:  .  tadalafil (CIALIS) 5 MG tablet, Take 1 tablet (5 mg total) by mouth daily. (Patient taking differently: Take 5 mg by mouth daily as needed for erectile dysfunction. ), Disp: 90 tablet, Rfl: 3 .  traZODone (DESYREL) 100 MG tablet, Take 1 tablet (100 mg total) by mouth at bedtime. (Patient taking differently: Take 50 mg by mouth at bedtime. ), Disp: 10 tablet, Rfl: 0 .  ustekinumab (STELARA) 90 MG/ML SOSY injection, Inject 1 mL (90 mg total) into the skin every 8 (eight) weeks., Disp: 0.84 mL, Rfl: 6  EXAM:  VITALS per  patient if applicable:  GENERAL: alert, oriented, appears well and in no acute distress  HEENT: atraumatic, conjunttiva clear, no obvious abnormalities on inspection of external nose and ears  NECK: normal movements of the head and neck  LUNGS: on inspection no signs of respiratory distress, breathing rate appears normal, no obvious gross SOB, gasping or wheezing  CV: no obvious cyanosis  MS: moves all visible extremities without noticeable abnormality  PSYCH/NEURO: pleasant and cooperative, no obvious depression or anxiety, speech and thought processing grossly intact  ASSESSMENT AND PLAN: He has flu-like symptoms that could be consistent with a tick borne illness  or a host of viral agents. He will stay on Doxycycline. We will bring him in to the lab today for a variety of tests, and we will send him for a second Covid test. We will excuse him from work until 02-10-19. Alysia Penna, MD  Discussed the following assessment and plan:  Flu-like symptoms - Plan: Basic metabolic panel, Hepatic function panel, TSH, CBC with Differential/Platelet, HIV Antibody (routine testing w rflx), Epstein-Barr virus VCA, IgG, Epstein-Barr virus VCA, IgM, Cytomegalovirus antibody, IgG, CMV IgM, POCT Influenza A/B, B. burgdorfi antibodies, Novel Coronavirus, NAA ([facility])     I discussed the assessment and treatment plan with the patient. The patient was provided an opportunity to ask questions and all were answered. The patient agreed with the plan and demonstrated an understanding of the instructions.   The patient was advised to call back or seek an in-person evaluation if the symptoms worsen or if the condition fails to improve as anticipated.     Review of Systems     Objective:   Physical Exam        Assessment & Plan:

## 2019-01-31 LAB — EPSTEIN-BARR VIRUS VCA, IGG: EBV VCA IgG: >750 U/mL — ABNORMAL HIGH

## 2019-01-31 LAB — HIV ANTIBODY (ROUTINE TESTING W REFLEX): HIV 1&2 Ab, 4th Generation: NONREACTIVE

## 2019-01-31 LAB — CYTOMEGALOVIRUS ANTIBODY, IGG: Cytomegalovirus Ab-IgG: >10 U/mL — ABNORMAL HIGH

## 2019-01-31 LAB — EPSTEIN-BARR VIRUS VCA, IGM: EBV VCA IgM: <36 U/mL

## 2019-01-31 LAB — CMV IGM: CMV IgM: <30 AU/mL

## 2019-01-31 LAB — B. BURGDORFI ANTIBODIES: B burgdorferi Ab IgG+IgM: <0.9 index

## 2019-02-03 LAB — POCT INFLUENZA A/B
Influenza A, POC: NEGATIVE
Influenza B, POC: NEGATIVE

## 2019-02-03 NOTE — Telephone Encounter (Signed)
His blood tests were negative for Lyme disease, but as I told him the Doxycycline I put him on would cover this anyway

## 2019-02-04 ENCOUNTER — Telehealth: Payer: Self-pay

## 2019-02-04 DIAGNOSIS — Z0279 Encounter for issue of other medical certificate: Secondary | ICD-10-CM

## 2019-02-04 LAB — NOVEL CORONAVIRUS, NAA: SARS-CoV-2, NAA: NOT DETECTED

## 2019-02-04 NOTE — Telephone Encounter (Signed)
Copied from [place] [phone]. Topic: General - Other >> Jan 31, 2019  4:02 PM Celene Kras A wrote: Reason for CRM: Pt called stating he is faxing over paperwork for his medical leave for his employer. Please advise. Pt states he will fax over on 02/03/2019.

## 2019-02-04 NOTE — Telephone Encounter (Signed)
We have not received the fax. I spoke with patient and he is re-faxing it

## 2019-02-05 ENCOUNTER — Encounter: Payer: Self-pay | Admitting: Family Medicine

## 2019-02-05 MED ORDER — LEVOFLOXACIN 500 MG PO TABS: 500.0000 mg | ORAL_TABLET | Freq: Every day | ORAL | 0 refills | Status: DC

## 2019-02-05 NOTE — Telephone Encounter (Signed)
Please advise. Pt was seen for similar symptoms on 01/30/19. Would you like to do a doxy visit?

## 2019-02-05 NOTE — Telephone Encounter (Signed)
Forms have been received and placed in Dr. Fry's folder 

## 2019-02-05 NOTE — Telephone Encounter (Signed)
Lets try one more antibiotic, different from the others. Call in Levaquin 500 mg daily for 10 days.

## 2019-02-07 DIAGNOSIS — Z0279 Encounter for issue of other medical certificate: Secondary | ICD-10-CM

## 2019-02-07 NOTE — Telephone Encounter (Signed)
The forms are ready

## 2019-02-10 NOTE — Telephone Encounter (Signed)
Forms have been faxed. Nothing further needed.  

## 2019-02-11 ENCOUNTER — Telehealth: Payer: Self-pay | Admitting: Family Medicine

## 2019-02-11 NOTE — Telephone Encounter (Signed)
The patient dropped off FMLA forms  Please fax to: [phone]   Disposition: Dr's Folder

## 2019-02-12 NOTE — Telephone Encounter (Signed)
Forms have been received and placed in Dr. Barbie Banner red Folder.

## 2019-02-17 NOTE — Telephone Encounter (Signed)
I have not received any forms..

## 2019-02-17 NOTE — Telephone Encounter (Signed)
Forms have been received and placed in Dr. Barbie Banner red folder.

## 2019-02-18 NOTE — Telephone Encounter (Signed)
To finish the forms I need dates of when the time off began and when it ends

## 2019-02-18 NOTE — Telephone Encounter (Signed)
Left message for patient to call back. Dr. [name] needs the dates time off started and the date that it will end to complete paper work.   CRM created.

## 2019-02-21 NOTE — Telephone Encounter (Signed)
Left message for patient to call back  

## 2019-02-26 NOTE — Telephone Encounter (Signed)
Left message for patient to call back  

## 2019-03-03 NOTE — Telephone Encounter (Signed)
Spoke with the patient. Leave started 01/27/2019 and ended 02/10/2019.

## 2019-03-04 NOTE — Telephone Encounter (Signed)
The form is ready  

## 2019-03-04 NOTE — Telephone Encounter (Signed)
Form has been faxed to the requested number. Patient also asked that we email him a copy. We are unable to email, I called the patient to see if he would like to pick up the forms as well.  Left message for patient to call back. CRM created.

## 2019-03-07 NOTE — Telephone Encounter (Signed)
Left message for patient to call back  

## 2019-03-20 ENCOUNTER — Ambulatory Visit: Payer: 59 | Admitting: Internal Medicine

## 2019-03-20 ENCOUNTER — Encounter: Payer: Self-pay | Admitting: Internal Medicine

## 2019-03-20 ENCOUNTER — Other Ambulatory Visit: Payer: 59

## 2019-03-20 VITALS — BP 124/70 | HR 68 | Temp 97.6°F | Ht 70.5 in | Wt 232.4 lb

## 2019-03-20 DIAGNOSIS — Z79899 Other long term (current) drug therapy: Secondary | ICD-10-CM

## 2019-03-20 DIAGNOSIS — T50905A Adverse effect of unspecified drugs, medicaments and biological substances, initial encounter: Secondary | ICD-10-CM

## 2019-03-20 DIAGNOSIS — Z7960 Long term (current) use of unspecified immunomodulators and immunosuppressants: Secondary | ICD-10-CM

## 2019-03-20 DIAGNOSIS — K50018 Crohn's disease of small intestine with other complication: Secondary | ICD-10-CM

## 2019-03-20 NOTE — Assessment & Plan Note (Addendum)
This is improved but he is having problems with Stelara side effects so I think we need to change to [facility].  I wanted to use that the last time but Stelara was next up on the formulary.  He has failed Humira due to antibodies and loss of efficacy.  I do not think going back to an anti-TNF class makes sense.  Consider methotrexate as adjunctive therapy or primary therapy depending upon what happens though in our minds Entyvio makes the most sense. I am going to check antibodies to Stelara.

## 2019-03-20 NOTE — Patient Instructions (Signed)
Please go to the lab for ustekimab (Stelara) antibody test.  I will get going to get approval for the Entyvio.  Will plan to get that infused at [facility].  Will arrange follow-up based upon the above.  I appreciate the opportunity to care for you. Gatha Mayer, MD, [facility] Regal

## 2019-03-20 NOTE — Assessment & Plan Note (Addendum)
?   Side effects, what he reports fevers arthralgias respiratory difficulty associated with the closeness to the injections is consistent.  There is considerable impact the quality of life.  We will seek to change to a different biologic, namely Entyvio

## 2019-03-20 NOTE — Progress Notes (Signed)
Justin MainlandJohn C Forker 68 y.o. 01-11-1951 [id number]  Assessment & Plan:    Crohn's ileitis [facility](HCC)  This is improved but he is having problems with Stelara side effects so I think we need to [facility].  I wanted to use that the last time but Stelara was next up on the formulary.  He has failed Humira due to antibodies and loss of efficacy.  I do not think going back to an anti-TNF class makes sense.  Consider methotrexate as adjunctive therapy or primary therapy depending upon what happens though in our minds Entyvio makes the most sense. I am going to check antibodies to Stelara.  Long-term use of immunosuppressant medication  - Stelara ? Side effects, what he reports fevers arthralgias respiratory difficulty associated with the closeness to the injections is consistent.  There is considerable impact the quality of life.  We will seek to change to a different biologic, namely Entyvio   I appreciate the opportunity to care for this patient. CC: Nelwyn SalisburyFry, Justin A, MD   Subjective:   Chief Complaint: Crohn's disease, side effects question  HPI Justin OfferCliff reports that his Crohn's disease is significantly better though he has 1 loose stool a day.  He remains on Stelara.  However he has developed breathing difficulty at times myalgias malaise and fevers.  Arthralgia as well and this is all worse closer to the time of his injections.  He is finally off prednisone and is glad about that.  He saw Dr. Clent RidgesFry, he has been tested for COVID twice, he has been tested for CMV and had other tests and no etiology found, a chest x-ray is clear.  Last Stelara injection sometime in August he thinks.    Allergies  Allergen Reactions  . Mercaptopurine Other (See Comments)    Pancreatitis   . Shellfish Allergy Anaphylaxis    Throat started to close  . Humira [Adalimumab]     Developed antibodies   Current Meds  Medication Sig  . albuterol (PROVENTIL HFA;VENTOLIN HFA) 108 (90 Base) MCG/ACT inhaler INHALE 2  PUFFS INTO THE LUNGS EVERY 4 (FOUR) HOURS AS NEEDED FOR WHEEZING OR SHORTNESS OF BREATH.  . diphenoxylate-atropine (LOMOTIL) 2.5-0.025 MG tablet 1-2 tablets 4 times a day as neded (Patient taking differently: Take 1 tablet by mouth 4 (four) times daily as needed for diarrhea or loose stools. )  . EPINEPHrine (EPIPEN 2-PAK) 0.3 mg/0.3 mL IJ SOAJ injection Inject 0.3 mLs (0.3 mg total) into the muscle as needed for anaphylaxis.  Marland Kitchen. esomeprazole (NEXIUM) 40 MG capsule Take 1 capsule (40 mg total) by mouth daily before breakfast.  . halobetasol (ULTRAVATE) 0.05 % ointment Apply topically 2 (two) times daily. (Patient taking differently: Apply 1 application topically 2 (two) times daily as needed (for skin infections). )  . hyoscyamine (LEVSIN SL) 0.125 MG SL tablet PLACE 1 TABLET UNDER THE TONGUE EVERY 4 HOURS AS NEEDED FOR CRAMPING (URGENT DEFECATION) (Patient taking differently: Take 0.125 mg by mouth every 4 (four) hours as needed for cramping. )  . ketoconazole (NIZORAL) 2 % cream Apply 1 application topically 2 (two) times daily as needed (fungal infections).  . Multiple Vitamins-Minerals (IMMUNE SUPPORT VITAMIN C PO) Take 1 tablet by mouth daily.  . tadalafil (CIALIS) 5 MG tablet Take 1 tablet (5 mg total) by mouth daily. (Patient taking differently: Take 5 mg by mouth daily as needed for erectile dysfunction. )  . traZODone (DESYREL) 100 MG tablet Take 1 tablet (100 mg total) by mouth at bedtime. (  Patient taking differently: Take 50 mg by mouth at bedtime. )  . ustekinumab (STELARA) 90 MG/ML SOSY injection Inject 1 mL (90 mg total) into the skin every 8 (eight) weeks.   Past Medical History:  Diagnosis Date  . Acute prostatitis 07/24/2007   Qualifier: Diagnosis of  By: Justin Ridges MD, Tera Mater   . Allergy    mild  . Arthritis    osteoarthritis  . Asthma   . Blood transfusion without reported diagnosis   . BPH (benign prostatic hypertrophy) with urinary obstruction   . Crohn's ileitis (HCC) suspected  05/03/2017  . Dilated aortic root (HCC)    noted on echo 08/2012  . Diverticulitis of colon   . EPIDIDYMITIS 02/15/2010   Qualifier: Diagnosis of  By: Justin Ridges MD, Tera Mater   . GERD (gastroesophageal reflux disease)   . H/O: GI bleed   . Hemorrhoids   . Hepatitis 1975   unknown type   . HERPES SIMPLEX INFECTION 10/14/2007   Qualifier: Diagnosis of  By: Justin Ridges MD, Tera Mater   . Hiatal hernia   . Ileus following gastrointestinal surgery (HCC) 12/26/2011  . Psoriasis    [facility]  . Recurrent ventral incisional hernia 05/10/2012  . SVT (supraventricular tachycardia) (HCC)   . Ulcer 08/21/2013   ileal   Past Surgical History:  Procedure Laterality Date  . BOWEL RESECTION  12/19/2011   Procedure: SMALL BOWEL RESECTION;  Surgeon: Mariella Saa, MD;  [facility];  Service: General;  Laterality: N/A;  with anastamosis and insertion mesh  . BRONCHOSCOPY    . COLON SURGERY  01/2004   x 2  . COLONOSCOPY W/ BIOPSIES  04/26/2017   per Dr. Leone Payor, no polyps, benign inflammation, repeat in 5 yrs   . CYSTOSCOPY    . ESOPHAGOGASTRODUODENOSCOPY    . HEMICOLECTOMY     left side, at [facility], diverticulitis  . HEMORRHOID BANDING    . HERNIA REPAIR     [phone] incisional hernia  . ILEOSTOMY    . ILEOSTOMY CLOSURE    . INSERTION OF MESH  07/31/2012   Procedure: INSERTION OF MESH;  Surgeon: Mariella Saa, MD;  [facility];  Service: General;;  . LAPAROTOMY  12/19/2011   Procedure: EXPLORATORY LAPAROTOMY;  Surgeon: Mariella Saa, MD;  [facility];  Service: General;  Laterality: N/A;  . TONSILLECTOMY    . UPPER GASTROINTESTINAL ENDOSCOPY    . VASECTOMY    . 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   Review of Systems As above  Objective:   Physical Exam @BP  124/70   Pulse 68   Temp 97.6 F (36.4 C)   Ht 5' 10.5" (1.791 m)   Wt 232 lb 6.4 oz (105.4 kg)   BMI 32.87 kg/m @  General:  NAD Eyes:   anicteric Lungs:  clear Heart::  S1S2 no rubs, murmurs or gallops Abdomen:  soft and  nontender, BS+ Ext:   no edema, cyanosis or clubbing    Data Reviewed:   Primary care notes labs in the EMR

## 2019-03-25 ENCOUNTER — Telehealth: Payer: Self-pay

## 2019-03-25 DIAGNOSIS — K50018 Crohn's disease of small intestine with other complication: Secondary | ICD-10-CM

## 2019-03-25 MED ORDER — ENTYVIO 300 MG IV SOLR: INTRAVENOUS | 9 refills | Status: DC

## 2019-03-25 NOTE — Telephone Encounter (Signed)
I spoke with Cecille Rubin at [facility]. She requests the last office note, recent labs, and the new orders for [facility].  She is notified that labs are pending and I will fax all over when it results to [phone].  She will do the pre-cert on the [facility].

## 2019-03-25 NOTE — Telephone Encounter (Signed)
-----   Message from Gatha Mayer, MD sent at 03/21/2019  8:35 PM EDT ----- Regarding: Need to start [name] He is having side effects of Stelara and failed Humira.  Try to use Entyvio before but insurance said Delsa Grana was next.  I have ordered antibody levels though because of side effects from Stelara I think we should switch.  We need to try to prescribe the Entyvio indicating he has failed Stelara and Humira and he wants to do infusions at [facility]  Let me know what happens  Thank you

## 2019-03-29 LAB — USTEKINUMAB AND ANTI-USTEK AB
Anti-Ustekinumab Antibody: <40 ng/mL
Ustekinumab: 5.5 ug/mL

## 2019-03-31 NOTE — Telephone Encounter (Signed)
Referral with labs sent to [facility].

## 2019-04-01 NOTE — Progress Notes (Signed)
[name],  No antibodies to the Stelara - think you are experiencing known side effects rather than immune reaction.  We sent info to [facility] about changing to [facility].  Have you heard anything?  CEG

## 2019-04-11 ENCOUNTER — Other Ambulatory Visit: Payer: Self-pay | Admitting: Family Medicine

## 2019-04-13 ENCOUNTER — Other Ambulatory Visit: Payer: Self-pay | Admitting: Family Medicine

## 2019-04-21 ENCOUNTER — Other Ambulatory Visit: Payer: Self-pay

## 2019-04-21 ENCOUNTER — Ambulatory Visit: Payer: 59 | Admitting: Gastroenterology

## 2019-04-21 ENCOUNTER — Encounter: Payer: Self-pay | Admitting: Gastroenterology

## 2019-04-21 ENCOUNTER — Other Ambulatory Visit (INDEPENDENT_AMBULATORY_CARE_PROVIDER_SITE_OTHER): Payer: 59

## 2019-04-21 ENCOUNTER — Telehealth: Payer: Self-pay | Admitting: Internal Medicine

## 2019-04-21 VITALS — BP 130/70 | HR 56 | Temp 98.0°F | Ht 70.5 in | Wt 229.2 lb

## 2019-04-21 DIAGNOSIS — K648 Other hemorrhoids: Secondary | ICD-10-CM | POA: Diagnosis not present

## 2019-04-21 DIAGNOSIS — K50018 Crohn's disease of small intestine with other complication: Secondary | ICD-10-CM

## 2019-04-21 LAB — CBC WITH DIFFERENTIAL/PLATELET
Basophils Absolute: 0.1 10*3/uL (ref 0.0–0.1)
Basophils Relative: 0.6 % (ref 0.0–3.0)
Eosinophils Absolute: 0.2 10*3/uL (ref 0.0–0.7)
Eosinophils Relative: 2.2 % (ref 0.0–5.0)
HCT: 44.6 % (ref 39.0–52.0)
Hemoglobin: 15 g/dL (ref 13.0–17.0)
Lymphocytes Relative: 31.2 % (ref 12.0–46.0)
Lymphs Abs: 2.7 10*3/uL (ref 0.7–4.0)
MCHC: 33.6 g/dL (ref 30.0–36.0)
MCV: 86.4 fl (ref 78.0–100.0)
Monocytes Absolute: 0.8 10*3/uL (ref 0.1–1.0)
Monocytes Relative: 9 % (ref 3.0–12.0)
Neutro Abs: 4.9 10*3/uL (ref 1.4–7.7)
Neutrophils Relative %: 57 % (ref 43.0–77.0)
Platelets: 197 10*3/uL (ref 150.0–400.0)
RBC: 5.16 Mil/uL (ref 4.22–5.81)
RDW: 13.7 % (ref 11.5–15.5)
WBC: 8.6 10*3/uL (ref 4.0–10.5)

## 2019-04-21 MED ORDER — HYDROCORTISONE ACE-PRAMOXINE 1-1 % EX CREA: 1.0000 "application " | TOPICAL_CREAM | Freq: Two times a day (BID) | CUTANEOUS | 0 refills | Status: DC

## 2019-04-21 NOTE — Telephone Encounter (Signed)
Can I please see him in the office for a 1 PM appointment today if that is okay with [employer] and staffing.   He should have a CBC sometime this morning as well.

## 2019-04-21 NOTE — Patient Instructions (Signed)
We will send Anal-pram to your pharmacy   Call in 5 days to report your progress    How to Take a Sitz Bath A sitz bath is a warm water bath that may be used to care for your rectum, genital area, or the area between your rectum and genitals (perineum). For a sitz bath, the water only comes up to your hips and covers your buttocks. A sitz bath may done at home in a bathtub or with a portable sitz bath that fits over the toilet. Your health care provider may recommend a sitz bath to help:  Relieve pain and discomfort after delivering a baby.  Relieve pain and itching from hemorrhoids or anal fissures.  Relieve pain after certain surgeries.  Relax muscles that are sore or tight. How to take a sitz bath Take 3-4 sitz baths a day, or as many as told by your health care provider. Bathtub sitz bath To take a sitz bath in a bathtub: 1. Partially fill a bathtub with warm water. The water should be deep enough to cover your hips and buttocks when you are sitting in the tub. 2. If your health care provider told you to put medicine in the water, follow his or her instructions. 3. Sit in the water. 4. Open the tub drain a little, and leave it open during your bath. 5. Turn on the warm water again, enough to replace the water that is draining out. Keep the water running throughout your bath. This helps keep the water at the right level and the right temperature. 6. Soak in the water for 15-20 minutes, or as long as told by your health care provider. 7. When you are done, be careful when you stand up. You may feel dizzy. 8. After the sitz bath, pat yourself dry. Do not rub your skin to dry it.  Over-the-toilet sitz bath To take a sitz bath with an over-the-toilet basin: 1. Follow the manufacturer's instructions. 2. Fill the basin with warm water. 3. If your health care provider told you to put medicine in the water, follow his or her instructions. 4. Sit on the seat. Make sure the water covers  your buttocks and perineum. 5. Soak in the water for 15-20 minutes, or as long as told by your health care provider. 6. After the sitz bath, pat yourself dry. Do not rub your skin to dry it. 7. Clean and dry the basin between uses. 8. Discard the basin if it cracks, or according to the manufacturer's instructions. Contact a health care provider if:  Your symptoms get worse. Do not continue with sitz baths if your symptoms get worse.  You have new symptoms. If this happens, do not continue with sitz baths until you talk with your health care provider. Summary  A sitz bath is a warm water bath in which the water only comes up to your hips and covers your buttocks.  A sitz bath may help relieve itching, relieve pain, and relax muscles that are sore or tight in the lower part of your body, including your genital area.  Take 3-4 sitz baths a day, or as many as told by your health care provider. Soak in the water for 15-20 minutes.  Do not continue with sitz baths if your symptoms get worse. This information is not intended to replace advice given to you by your health care provider. Make sure you discuss any questions you have with your health care provider. Document Released: 03/25/2004 Document Revised: 07/05/2017  Document Reviewed: 07/05/2017 Elsevier Patient Education  [employer].

## 2019-04-21 NOTE — Progress Notes (Signed)
HPI: This is a very pleasant 68 year old man whom I am meeting for the first time today  He has small bowel Crohn's disease.  He developed antibodies to Humira and so he was switched to Stelara.  GI symptoms improved however he was felt to have side effects from the Stelara and about a month ago was changed to [facility].  Allergies to immunomodulators mercaptopurine, pancreatitis and so has been on monotherapy.  he also has a remote history of complicated diverticular disease status post segmental colectomy left colon.  He called this morning with concerns of perirectal abscess.  A CBC just prior to this visit was completely normal.  He has had a painful lump at his anus for about 3 days.  He has been doing sitz bath's twice daily.  He has had no bleeding.  He has not seen any discharge.  He has felt a bit warm at home.  No changes in his bowels.  He has his usual 3 fairly loose stools daily.  No serious abdominal pains.  He has not started Entyvio yet.  He is still waiting on insurance approval.  His last Stelara was about 7 weeks ago  Chief complaint is painful bump at anus  ROS: complete GI ROS as described in HPI, all other review negative.  Constitutional:  No unintentional weight loss   Past Medical History:  Diagnosis Date  . Acute prostatitis 07/24/2007   Qualifier: Diagnosis of  By: [name] MD, [name]   . Allergy    mild  . Arthritis    osteoarthritis  . Asthma   . Blood transfusion without reported diagnosis   . BPH (benign prostatic hypertrophy) with urinary obstruction   . Crohn's ileitis ([place]) suspected 05/03/2017  . Dilated aortic root ([place])    noted on echo 08/2012  . Diverticulitis of colon   . EPIDIDYMITIS 02/15/2010   Qualifier: Diagnosis of  By: [name] MD, [name]   . GERD (gastroesophageal reflux disease)   . H/O: GI bleed   . Hemorrhoids   . Hepatitis 1975   unknown type   . HERPES SIMPLEX INFECTION 10/14/2007   Qualifier: Diagnosis of  By: [name] MD, [name]    . Hiatal hernia   . Ileus following gastrointestinal surgery ([place]) 12/26/2011  . Psoriasis    sees [name] at [facility].  . Recurrent ventral incisional hernia 05/10/2012  . SVT (supraventricular tachycardia) ([place])   . Ulcer 08/21/2013   ileal    Past Surgical History:  Procedure Laterality Date  . BOWEL RESECTION  12/19/2011   Procedure: SMALL BOWEL RESECTION;  Surgeon: [name], MD;  Location: [facility];  Service: General;  Laterality: N/A;  with anastamosis and insertion mesh  . BRONCHOSCOPY    . COLON SURGERY  01/2004   x 2  . COLONOSCOPY W/ BIOPSIES  04/26/2017   per [name], no polyps, benign inflammation, repeat in 5 yrs   . CYSTOSCOPY    . ESOPHAGOGASTRODUODENOSCOPY    . HEMICOLECTOMY     left side, at [facility], diverticulitis  . HEMORRHOID BANDING    . HERNIA REPAIR     [phone] incisional hernia  . ILEOSTOMY    . ILEOSTOMY CLOSURE    . INSERTION OF MESH  07/31/2012   Procedure: INSERTION OF MESH;  Surgeon: [name], MD;  [facility];  Service: General;;  . LAPAROTOMY  12/19/2011   Procedure: EXPLORATORY LAPAROTOMY;  Surgeon: [name], MD;  Location: [facility];  Service: General;  Laterality: N/A;  . TONSILLECTOMY    . UPPER GASTROINTESTINAL ENDOSCOPY    . VASECTOMY    . VENTRAL HERNIA REPAIR  07/31/2012   Procedure: HERNIA REPAIR VENTRAL ADULT;  Surgeon: [name], MD;  [facility];  Service: General;  Laterality: N/A;    Current Outpatient Medications  Medication Sig Dispense Refill  . albuterol (PROVENTIL HFA;VENTOLIN HFA) 108 (90 Base) MCG/ACT inhaler INHALE 2 PUFFS INTO THE LUNGS EVERY 4 (FOUR) HOURS AS NEEDED FOR WHEEZING OR SHORTNESS OF BREATH. 8.5 Inhaler 2  . diphenoxylate-atropine (LOMOTIL) 2.5-0.025 MG tablet 1-2 tablets 4 times a day as neded (Patient taking differently: Take 1 tablet by mouth 4 (four) times daily as needed for diarrhea or loose stools. ) 90 tablet 1  . EPINEPHrine (EPIPEN 2-PAK) 0.3  mg/0.3 mL IJ SOAJ injection Inject 0.3 mLs (0.3 mg total) into the muscle as needed for anaphylaxis. 1 Device 0  . esomeprazole (NEXIUM) 40 MG capsule Take 1 capsule (40 mg total) by mouth daily before breakfast. 30 capsule 2  . halobetasol (ULTRAVATE) 0.05 % ointment Apply topically 2 (two) times daily. (Patient taking differently: Apply 1 application topically 2 (two) times daily as needed (for skin infections). ) 50 g 5  . hyoscyamine (LEVSIN SL) 0.125 MG SL tablet PLACE 1 TABLET UNDER THE TONGUE EVERY 4 HOURS AS NEEDED FOR CRAMPING (URGENT DEFECATION) (Patient taking differently: Take 0.125 mg by mouth every 4 (four) hours as needed for cramping. ) 60 tablet 2  . ketoconazole (NIZORAL) 2 % cream APPLY TWICE A DAY AS NEEDED FOR FUNGAL INFECTION 30 g 2  . Multiple Vitamins-Minerals (IMMUNE SUPPORT VITAMIN C PO) Take 1 tablet by mouth daily.    . tadalafil (CIALIS) 5 MG tablet Take 1 tablet (5 mg total) by mouth daily. (Patient taking differently: Take 5 mg by mouth daily as needed for erectile dysfunction. ) 90 tablet 3  . traZODone (DESYREL) 100 MG tablet Take 0.5 tablets (50 mg total) by mouth at bedtime. 30 tablet 2  . vedolizumab (ENTYVIO) 300 MG injection Entyvio 300 mg IV on week 0, week 2, week 6 then q 8 weeks 1 each 9   No current facility-administered medications for this visit.     Allergies as of 04/21/2019 - Review Complete 04/21/2019  Allergen Reaction Noted  . Mercaptopurine Other (See Comments) 10/08/2017  . Shellfish allergy Anaphylaxis 12/12/2011  . Humira [adalimumab]  03/20/2019    Family History  Problem Relation Age of Onset  . Lung cancer Mother   . Leukemia Father   . Hypertension Father   . Heart disease Father   . Heart attack Father   . Prostate cancer Father   . Prostate cancer Paternal Uncle   . Colon cancer Neg Hx   . Stomach cancer Neg Hx   . Colon polyps Neg Hx   . Esophageal cancer Neg Hx   . Rectal cancer Neg Hx     Social History    Socioeconomic History  . Marital status: Married    Spouse name: Not on file  . Number of children: 2  . Years of education: Not on file  . Highest education level: Not on file  Occupational History  . 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 Financial resource strain: Not on file  . Food insecurity    Worry: Not on file    Inability: Not on file  . Transportation needs  Medical: Not on file    Non-medical: Not on file  Tobacco Use  . Smoking status: Never Smoker  . Smokeless tobacco: Never Used  Substance and Sexual Activity  . Alcohol use: Yes    Alcohol/week: 0.0 standard drinks    Comment: once a month or less  . Drug use: No  . Sexual activity: Yes    Partners: Female  Lifestyle  . Physical activity    Days per week: Not on file    Minutes per session: Not on file  . Stress: Not on file  Relationships  . Social Herbalist on phone: Not on file    Gets together: Not on file    Attends religious service: Not on file    Active member of club or organization: Not on file    Attends meetings of clubs or organizations: Not on file    Relationship status: Not on file  . Intimate partner violence    Fear of current or ex partner: Not on file    Emotionally abused: Not on file    Physically abused: Not on file    Forced sexual activity: Not on file  Other Topics Concern  . 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  No fluctuance.  This is not thrombosed but it is tender and swollen a bit.  No perianal abscess.  Assessment and plan: 68 y.o. male with uncomfortable external anal hemorrhoid  When he called it sounded like he might have a perianal abscess however he clearly does not.  This is a swollen right sided 1 cm partially thrombosed external anal hemorrhoid.  His white blood cell count was normal just prior to this exam.  He is afebrile.  Recommended sitz bath's twice daily, I am giving him a prescription for Analpram to place at the site after each sitz bath.  He will call to report on his situation in 5 days.  We also made a follow-up appointment with [name] who is his primary gastroenterologist for about 4 weeks from now.  Please see the "Patient Instructions" section for addition details about the plan.  Owens Loffler, MD [facility] 04/21/2019, 12:59 PM

## 2019-04-21 NOTE — Telephone Encounter (Signed)
Pt reported that he has an anal abscess and would like to discuss.

## 2019-04-21 NOTE — Telephone Encounter (Signed)
Pt aware and scheduled to see Dr. Ardis Hughs today at 1pm.

## 2019-04-21 NOTE — Telephone Encounter (Signed)
[name] pt with hx of crohns calling stating that he thinks he has a rectal abscess. Reports he feels a mass/hard area around her rectal area that is warm to touch and tender/sore. He states his urologist prescribed an antibiotic and this has not helped. Asked if pt has had a fever and he states he has "felt feverish". As DOD please advise. There are no APP appts today.

## 2019-05-02 ENCOUNTER — Telehealth: Payer: Self-pay | Admitting: Gastroenterology

## 2019-05-02 NOTE — Telephone Encounter (Signed)
[facility] needs to confirm delivery address for entyvio.

## 2019-05-05 NOTE — Telephone Encounter (Signed)
Tried again to reach the pharmacy and was on hold over 15 min.  Will wait for the pharmacy to call back

## 2019-05-05 NOTE — Telephone Encounter (Signed)
Tried to reach the pharmacy and was on hold 15 min.  Will try at a later time

## 2019-05-12 ENCOUNTER — Other Ambulatory Visit: Payer: Self-pay | Admitting: Family Medicine

## 2019-05-15 NOTE — Telephone Encounter (Signed)
Okay for 90 day supply vs. 30?

## 2019-05-27 ENCOUNTER — Other Ambulatory Visit: Payer: Self-pay

## 2019-05-27 ENCOUNTER — Ambulatory Visit: Payer: 59 | Admitting: Internal Medicine

## 2019-05-27 ENCOUNTER — Encounter: Payer: Self-pay | Admitting: Internal Medicine

## 2019-05-27 DIAGNOSIS — Z79899 Other long term (current) drug therapy: Secondary | ICD-10-CM | POA: Diagnosis not present

## 2019-05-27 DIAGNOSIS — K645 Perianal venous thrombosis: Secondary | ICD-10-CM | POA: Diagnosis not present

## 2019-05-27 DIAGNOSIS — Z7960 Long term (current) use of unspecified immunomodulators and immunosuppressants: Secondary | ICD-10-CM

## 2019-05-27 DIAGNOSIS — K50018 Crohn's disease of small intestine with other complication: Secondary | ICD-10-CM

## 2019-05-27 NOTE — Patient Instructions (Signed)
I hope the Weyman Rodney works well and without any problems.  I agree with taking the budesonide for the loading period of Entyvio and seeing if you can stop when that is done - k to take x 2-3 mos if you need it.   Call in January to get a January/February appointment and sooner as needed.   I appreciate the opportunity to care for you. Gatha Mayer, MD, [facility]

## 2019-05-27 NOTE — Progress Notes (Signed)
CARDIOLOGY CONSULT NOTE       Patient ID: [name] MRN: [id number] DOB/AGE: [birth year] 68 y.o.  Admit date: (Not on file) Referring Physician: [name] Primary Physician: [name], MD Primary Cardiologist: New Reason for Consultation: Abnormal ECG   Active Problems:   * No active hospital problems. *   HPI:  68 y.o. 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 Acute prostatitis 07/24/2007   Qualifier: Diagnosis of  By: [name] MD, [name]   . Allergy    mild  . Arthritis    osteoarthritis  . Asthma   . Blood transfusion without reported diagnosis   . BPH (benign prostatic hypertrophy) with urinary obstruction   . Crohn's ileitis ([place]) suspected 05/03/2017  . Dilated aortic root ([place])    noted on echo 08/2012  . Diverticulitis of colon   . EPIDIDYMITIS 02/15/2010   Qualifier: Diagnosis of  By: [name] MD, [name]   . GERD (gastroesophageal reflux disease)   . H/O: GI bleed   . Hemorrhoids   . Hepatitis 1975   unknown type   . HERPES SIMPLEX INFECTION 10/14/2007   Qualifier: Diagnosis of  By: [name] MD, [name]   . Hiatal hernia   . Ileus following gastrointestinal surgery ([place]) 12/26/2011  . Psoriasis    sees [name] at [facility].  . Recurrent ventral incisional  hernia 05/10/2012  . SVT (supraventricular tachycardia) ([place])   . Ulcer 08/21/2013   ileal    Family History  Problem Relation Age of Onset  . Lung cancer Mother   . Leukemia Father   . Hypertension Father   . Heart disease Father   . Heart attack Father   . Prostate cancer Father   . Prostate cancer Paternal Uncle   . Colon cancer Neg Hx   . Stomach cancer Neg Hx   . Colon polyps Neg Hx   . Esophageal cancer Neg Hx   . Rectal cancer Neg Hx     Social History   Socioeconomic History  . Marital status: Married    Spouse name: Not on file  . Number of children: 2  . Years of education: Not on file  . Highest education level: Not on file  Occupational History  . 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 Financial resource strain: Not on file  . Food insecurity    Worry: Not on file    Inability: Not on file  . Transportation needs    Medical: Not on file    Non-medical: Not on file  Tobacco Use  . Smoking status: Never Smoker  . Smokeless tobacco: Never Used  Substance and  Sexual Activity  . Alcohol use: Yes    Alcohol/week: 0.0 standard drinks    Comment: once a month or less  . Drug use: No  . Sexual activity: Yes    Partners: Female  Lifestyle  . Physical activity    Days per week: Not on file    Minutes per session: Not on file  . Stress: Not on file  Relationships  . Social Herbalist on phone: Not on file    Gets together: Not on file    Attends religious service: Not on file    Active member of club or organization: Not on file    Attends meetings of clubs or organizations: Not on file    Relationship status: Not on file  . Intimate partner violence    Fear of current or ex partner: Not on file    Emotionally abused: Not on file    Physically abused: Not on file    Forced sexual activity: Not on file  Other Topics Concern  . 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 BOWEL RESECTION  12/19/2011   Procedure: SMALL BOWEL RESECTION;  Surgeon: [name], MD;  [facility];  Service: General;  Laterality: N/A;  with anastamosis and insertion mesh  . BRONCHOSCOPY    . COLON SURGERY  01/2004   x 2  . COLONOSCOPY W/ BIOPSIES  04/26/2017   per [name], no polyps, benign inflammation, repeat in 5 yrs   . CYSTOSCOPY    . ESOPHAGOGASTRODUODENOSCOPY    . HEMICOLECTOMY     left side, at [facility], diverticulitis  . HEMORRHOID BANDING    . HERNIA REPAIR     [phone] incisional hernia  . ILEOSTOMY    . ILEOSTOMY CLOSURE    . INSERTION OF MESH  07/31/2012   Procedure: INSERTION OF MESH;  Surgeon: [name], MD;  [facility];  Service: General;;  . LAPAROTOMY  12/19/2011   Procedure: EXPLORATORY LAPAROTOMY;  Surgeon: [name], MD;  [facility];  Service: General;  Laterality: N/A;  . TONSILLECTOMY    . UPPER GASTROINTESTINAL ENDOSCOPY    . VASECTOMY    . VENTRAL HERNIA REPAIR  07/31/2012   Procedure: HERNIA REPAIR VENTRAL ADULT;  Surgeon: [name], MD;  [facility];  Service: General;  Laterality: N/A;        Physical Exam: Blood pressure 120/76, pulse (!) 59, height 5' 10"  (1.778 m), weight 227 lb (103 kg), SpO2 96 %.    Affect appropriate Healthy:  appears stated age 83: normal Neck supple with no adenopathy JVP normal no bruits no thyromegaly Lungs clear with no wheezing and good diaphragmatic motion Heart:  S1/S2 no murmur, no rub, gallop or click PMI normal Abdomen: benighn, BS positve, no tenderness, no AAA no bruit.  No HSM or HJR post colectomy  Distal pulses intact  with no bruits No edema Neuro non-focal Skin warm and dry No muscular weakness   Labs:   Lab Results  Component Value Date  WBC 8.6 04/21/2019   HGB 15.0 04/21/2019   HCT 44.6 04/21/2019   MCV 86.4 04/21/2019   PLT 197.0 04/21/2019   No results for input(s): NA, K, CL, CO2, BUN, CREATININE, CALCIUM, PROT, BILITOT, ALKPHOS, ALT, AST, GLUCOSE in the last 168 hours.  Invalid input(s): LABALBU Lab Results  Component Value Date   CKTOTAL 47 01/08/2018   CKMB 1.8 12/21/2011   TROPONINI <0.30 12/21/2011    Lab Results  Component Value Date   CHOL 142 01/08/2018   CHOL 152 05/16/2012   CHOL  09/05/2008    130        ATP III CLASSIFICATION:  <200     mg/dL   Desirable  200-239  mg/dL   Borderline High  >=240    mg/dL   High          Lab Results  Component Value Date   HDL 26.70 (L) 01/08/2018   HDL 30.90 (L) 05/16/2012   HDL 18 (L) 09/05/2008   Lab Results  Component Value Date   [facility]  09/05/2008    53        Total Cholesterol/HDL:CHD Risk Coronary Heart Disease Risk Table                     Men   Women  1/2 Average Risk   3.4   3.3  Average Risk       5.0   4.4  2 X Average Risk   9.6   7.1  3 X Average Risk  23.4   11.0        Use the calculated Patient Ratio above and the CHD Risk Table to determine the patient's CHD Risk.        ATP III CLASSIFICATION (LDL):  <100     mg/dL   Optimal  100-129  mg/dL   Near or Above                    Optimal  130-159  mg/dL   Borderline  160-189  mg/dL   High  >190     mg/dL   Very High   Lab Results  Component Value Date   TRIG (H) 01/08/2018    460.0 Triglyceride is over 400; calculations on Lipids are invalid.   TRIG 218.0 (H) 05/16/2012   TRIG 295 (H) 09/05/2008   Lab Results  Component Value Date   CHOLHDL 5 01/08/2018   CHOLHDL 5 05/16/2012   CHOLHDL 7.2 09/05/2008   Lab Results  Component Value Date   LDLDIRECT 71.0 01/08/2018   LDLDIRECT 92.0 05/16/2012      Radiology: No results found.   EKG: See HPI   ASSESSMENT AND PLAN:   1. Abnormal ECG:  Bi-fasicular block new RBBB since 2014 will order echo to make sure no ne structural heart issue Discussed fact that this puts him at marginally higher risk of needing a pacer in future Yearly ECG  2. History of SVT distant no indication for EP referral / ablation 3. Asthma:  No active wheezing PRN Proventil 4. GI:  chrohnes with previous colectomy f/u Ardis Hughs on [place] and Weyman Rodney   Signed: Jenkins Rouge 05/29/2019, 4:40 PM

## 2019-05-27 NOTE — Progress Notes (Signed)
[name] 68 y.o. May 30, 1951 [id number]  Assessment & Plan:  Crohn's ileitis [facility])  He is starting to improve now that Weyman Rodney was started though he is not yet loaded.  I think taking budesonide to keep his bowel movements and Crohn's and check is reasonable while he is loading and perhaps a bit longer.  Continue at 9 mg.  May stop when the load is done or after 2 or 3 months.  As needed Lomotil.  See me in about 3 months sooner if needed.  Long-term use of immunosuppressant medication  - Entyvio So far so good Had flu shot QuantiFERON was negative in October done through [facility] rheumatology  External hemorrhoid, thrombosed resolved  I appreciate the opportunity to care for this patient. CC: [name], MD Leigh Aurora, MD    Subjective:   Chief Complaint: Crohn's disease on [facility] [name] is here today for follow-up of his Crohn's ileitis.  He finally was able to get started on Entyvio he said one of the loading infusions.  Has about 4 bowel movements a day ranging in [place] stool scale 5-7.  Start out somewhat formed and then become watery.  No significant bleeding reported.  He self started budesonide 9 mg daily and uses Lomotil about 2 or 3 times a week.  He had a thrombosed hemorrhoid seen by Dr. Ardis Hughs about a month ago that is much better still little itchy at times.  Getting ready to retire he thinks.  Going to check into the Medicare plans available, he is going to talk to the [facility] rheumatology people about coverage to try to make sure that he and his wife's Biologics are covered. Allergies  Allergen Reactions  . Mercaptopurine Other (See Comments)    Pancreatitis   . Shellfish Allergy Anaphylaxis    Throat started to close  . Humira [Adalimumab]     Developed antibodies   Current Meds  Medication Sig  . albuterol (PROVENTIL HFA;VENTOLIN HFA) 108 (90 Base) MCG/ACT inhaler INHALE 2 PUFFS INTO THE LUNGS EVERY 4 (FOUR) HOURS AS NEEDED FOR  WHEEZING OR SHORTNESS OF BREATH.  . budesonide (ENTOCORT EC) 3 MG 24 hr capsule Take 9 mg by mouth daily.  . diphenoxylate-atropine (LOMOTIL) 2.5-0.025 MG tablet 1-2 tablets 4 times a day as neded (Patient taking differently: Take 1 tablet by mouth 4 (four) times daily as needed for diarrhea or loose stools. )  . EPINEPHrine (EPIPEN 2-PAK) 0.3 mg/0.3 mL IJ SOAJ injection Inject 0.3 mLs (0.3 mg total) into the muscle as needed for anaphylaxis.  Marland Kitchen esomeprazole (NEXIUM) 40 MG capsule Take 1 capsule (40 mg total) by mouth daily before breakfast.  . halobetasol (ULTRAVATE) 0.05 % ointment Apply topically 2 (two) times daily. (Patient taking differently: Apply 1 application topically 2 (two) times daily as needed (for skin infections). )  . hyoscyamine (LEVSIN SL) 0.125 MG SL tablet PLACE 1 TABLET UNDER THE TONGUE EVERY 4 HOURS AS NEEDED FOR CRAMPING (URGENT DEFECATION) (Patient taking differently: Take 0.125 mg by mouth every 4 (four) hours as needed for cramping. )  . ketoconazole (NIZORAL) 2 % cream APPLY TWICE A DAY AS NEEDED FOR FUNGAL INFECTION  . Multiple Vitamins-Minerals (IMMUNE SUPPORT VITAMIN C PO) Take 1 tablet by mouth daily.  . pramoxine-hydrocortisone (ANALPRAM-HC) 1-1 % rectal cream Place 1 application rectally 2 (two) times daily. Use twice daily before SITZ baths  . tadalafil (CIALIS) 5 MG tablet Take 1 tablet (5 mg total) by mouth daily. (Patient taking differently:  Take 5 mg by mouth daily as needed for erectile dysfunction. )  . traZODone (DESYREL) 100 MG tablet TAKE ONE HALF TABLETS (50 MG TOTAL) BY MOUTH AT BEDTIME.  . vedolizumab (ENTYVIO) 300 MG injection Entyvio 300 mg IV on week 0, week 2, week 6 then q 8 weeks   Past Medical History:  Diagnosis Date  . Acute prostatitis 07/24/2007   Qualifier: Diagnosis of  By: [name] MD, [name]   . Allergy    mild  . Arthritis    osteoarthritis  . Asthma   . Blood transfusion without reported diagnosis   . BPH (benign prostatic  hypertrophy) with urinary obstruction   . Crohn's ileitis ([place]) suspected 05/03/2017  . Dilated aortic root ([place])    noted on echo 08/2012  . Diverticulitis of colon   . EPIDIDYMITIS 02/15/2010   Qualifier: Diagnosis of  By: [name] MD, [name]   . GERD (gastroesophageal reflux disease)   . H/O: GI bleed   . Hemorrhoids   . Hepatitis 1975   unknown type   . HERPES SIMPLEX INFECTION 10/14/2007   Qualifier: Diagnosis of  By: [name] MD, [name]   . Hiatal hernia   . Ileus following gastrointestinal surgery ([place]) 12/26/2011  . Psoriasis    se[facility] [name] at [facility].  . Recurrent ventral incisional hernia 05/10/2012  . SVT (supraventricular tachycardia) ([place])   . Ulcer 08/21/2013   ileal   Past Surgical History:  Procedure Laterality Date  . BOWEL RESECTION  12/19/2011   Procedure: SMALL BOWEL RESECTION;  Surgeon: [name], MD;  Location: [facility];  Service: General;  Laterality: N/A;  with anastamosis and insertion mesh  . BRONCHOSCOPY    . COLON SURGERY  01/2004   x 2  . COLONOSCOPY W/ BIOPSIES  04/26/2017   per [name], no polyps, benign inflammation, repeat in 5 yrs   . CYSTOSCOPY    . ESOPHAGOGASTRODUODENOSCOPY    . HEMICOLECTOMY     left side, at [facility], diverticulitis  . HEMORRHOID BANDING    . HERNIA REPAIR     [phone] incisional hernia  . ILEOSTOMY    . ILEOSTOMY CLOSURE    . INSERTION OF MESH  07/31/2012   Procedure: INSERTION OF MESH;  Surgeon: [name], MD;  Location: [facility];  Service: General;;  . LAPAROTOMY  12/19/2011   Procedure: EXPLORATORY LAPAROTOMY;  Surgeon: [name], MD;  Location: [facility];  Service: General;  Laterality: N/A;  . TONSILLECTOMY    . UPPER GASTROINTESTINAL ENDOSCOPY    . VASECTOMY    . 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   Review of Systems As above  Objective:   Physical Exam @BP  122/66  Pulse 68   Temp 97.8 F (36.6 C)   Ht 5' 10"  (1.778 m)   Wt 223 lb 12.8 oz (101.5 kg)   BMI 32.11 kg/m @  General:  NAD Eyes:   anicteric Lungs:  clear Heart::  S1S2 no rubs, murmurs or gallops Abdomen:  soft and nontender, BS+ midline scar slight bulge no frank hernia Ext:   no edema, cyanosis or clubbing    Data Reviewed:  October labs from [facility] rheumatology CRP 6, that is normal.  LFTs normal.  QuantiFERON negative.

## 2019-05-27 NOTE — Assessment & Plan Note (Addendum)
He is starting to improve now that Weyman Rodney was started though he is not yet loaded.  I think taking budesonide to keep his bowel movements and Crohn's and check is reasonable while he is loading and perhaps a bit longer.  Continue at 9 mg.  May stop when the load is done or after 2 or 3 months.  As needed Lomotil.  See me in about 3 months sooner if needed.

## 2019-05-27 NOTE — Assessment & Plan Note (Signed)
resolved 

## 2019-05-27 NOTE — Assessment & Plan Note (Addendum)
So far so good Had flu shot QuantiFERON was negative in October done through [facility] rheumatology

## 2019-05-29 ENCOUNTER — Other Ambulatory Visit: Payer: Self-pay

## 2019-05-29 ENCOUNTER — Encounter: Payer: Self-pay | Admitting: Cardiovascular Disease

## 2019-05-29 ENCOUNTER — Ambulatory Visit: Payer: 59 | Admitting: Cardiovascular Disease

## 2019-05-29 VITALS — BP 120/76 | HR 59 | Ht 70.0 in | Wt 227.0 lb

## 2019-05-29 DIAGNOSIS — I453 Trifascicular block: Secondary | ICD-10-CM | POA: Diagnosis not present

## 2019-05-29 DIAGNOSIS — R9431 Abnormal electrocardiogram [ECG] [EKG]: Secondary | ICD-10-CM | POA: Diagnosis not present

## 2019-05-29 NOTE — Patient Instructions (Addendum)
Medication Instructions:   *If you need a refill on your cardiac medications before your next appointment, please call your pharmacy*  Lab Work:  If you have labs (blood work) drawn today and your tests are completely normal, you will receive your results only by: . MyChart Message (if you have MyChart) OR . A paper copy in the mail If you have any lab test that is abnormal or we need to change your treatment, we will call you to review the results.  Testing/Procedures: Your physician has requested that you have an echocardiogram. Echocardiography is a painless test that uses sound waves to create images of your heart. It provides your doctor with information about the size and shape of your heart and how well your heart's chambers and valves are working. This procedure takes approximately one hour. There are no restrictions for this procedure.  Follow-Up: At [facility], you and your health needs are our priority.  As part of our continuing mission to provide you with exceptional heart care, we have created designated Provider Care Teams.  These Care Teams include your primary Cardiologist (physician) and Advanced Practice Providers (APPs -  Physician Assistants and Nurse Practitioners) who all work together to provide you with the care you need, when you need it.  Your next appointment:   12 month(s)  The format for your next appointment:   In Person  Provider:   You may see Dr. Nishan or one of the following Advanced Practice Providers on your designated Care Team:    Lori Gerhardt, NP  Laura Ingold, NP  Jill McDaniel, NP  

## 2019-06-06 ENCOUNTER — Other Ambulatory Visit (HOSPITAL_COMMUNITY): Payer: 59

## 2019-06-17 ENCOUNTER — Other Ambulatory Visit (HOSPITAL_COMMUNITY): Payer: 59

## 2019-06-18 ENCOUNTER — Encounter (HOSPITAL_COMMUNITY): Payer: Self-pay | Admitting: Cardiovascular Disease

## 2019-07-24 ENCOUNTER — Other Ambulatory Visit (HOSPITAL_COMMUNITY): Payer: Self-pay

## 2019-08-12 ENCOUNTER — Other Ambulatory Visit: Payer: Self-pay

## 2019-08-12 ENCOUNTER — Ambulatory Visit (HOSPITAL_COMMUNITY): Payer: BLUE CROSS/BLUE SHIELD | Attending: Cardiovascular Disease

## 2019-08-12 DIAGNOSIS — R9431 Abnormal electrocardiogram [ECG] [EKG]: Secondary | ICD-10-CM

## 2019-08-26 DIAGNOSIS — K5000 Crohn's disease of small intestine without complications: Secondary | ICD-10-CM | POA: Diagnosis not present

## 2019-09-26 ENCOUNTER — Other Ambulatory Visit: Payer: Self-pay

## 2019-09-26 ENCOUNTER — Emergency Department (HOSPITAL_BASED_OUTPATIENT_CLINIC_OR_DEPARTMENT_OTHER)
Admission: EM | Admit: 2019-09-26 | Discharge: 2019-09-26 | Disposition: A | Discharge status: Home / Self Care | Payer: BLUE CROSS/BLUE SHIELD | Attending: Emergency Medicine | Admitting: Emergency Medicine

## 2019-09-26 ENCOUNTER — Encounter (HOSPITAL_BASED_OUTPATIENT_CLINIC_OR_DEPARTMENT_OTHER): Payer: Self-pay

## 2019-09-26 DIAGNOSIS — R102 Pelvic and perineal pain: Secondary | ICD-10-CM | POA: Insufficient documentation

## 2019-09-26 DIAGNOSIS — Z79899 Other long term (current) drug therapy: Secondary | ICD-10-CM | POA: Insufficient documentation

## 2019-09-26 DIAGNOSIS — Z91013 Allergy to seafood: Secondary | ICD-10-CM | POA: Insufficient documentation

## 2019-09-26 DIAGNOSIS — Z888 Allergy status to other drugs, medicaments and biological substances status: Secondary | ICD-10-CM | POA: Diagnosis not present

## 2019-09-26 DIAGNOSIS — K6289 Other specified diseases of anus and rectum: Secondary | ICD-10-CM | POA: Diagnosis not present

## 2019-09-26 DIAGNOSIS — J45909 Unspecified asthma, uncomplicated: Secondary | ICD-10-CM | POA: Insufficient documentation

## 2019-09-26 LAB — URINALYSIS, ROUTINE W REFLEX MICROSCOPIC
Bilirubin Urine: NEGATIVE
Glucose, UA: NEGATIVE mg/dL
Hgb urine dipstick: NEGATIVE
Ketones, ur: 15 mg/dL — AB
Leukocytes,Ua: NEGATIVE
Nitrite: NEGATIVE
Protein, ur: NEGATIVE mg/dL
Specific Gravity, Urine: 1.02 (ref 1.005–1.030)
pH: 7 (ref 5.0–8.0)

## 2019-09-26 MED ORDER — CIPROFLOXACIN HCL 500 MG PO TABS: 500.0000 mg | ORAL_TABLET | Freq: Two times a day (BID) | ORAL | 0 refills | Status: AC

## 2019-09-26 MED ORDER — CIPROFLOXACIN HCL 500 MG PO TABS
500.0000 mg | ORAL_TABLET | Freq: Once | ORAL | Status: AC
  Administered 2019-09-26: 500 mg via ORAL
  Filled 2019-09-26: qty 1

## 2019-09-26 MED ORDER — OXYCODONE-ACETAMINOPHEN 5-325 MG PO TABS: 1.0000 | ORAL_TABLET | Freq: Four times a day (QID) | ORAL | 0 refills | Status: DC | PRN

## 2019-09-26 NOTE — ED Triage Notes (Signed)
Pt c/o "pain to my prostate" x 1 month-worse x 2 weeks-virtual visit with PCP-urology appt 3/25-c/o increase pain-NAD-steady gait

## 2019-09-26 NOTE — ED Provider Notes (Signed)
[place] EMERGENCY DEPARTMENT Provider Note   CSN: [id number] Arrival date & time: 09/26/19  1650     History Chief Complaint  Patient presents with  . Prostate Check    [name] is a 69 y.o. male history of prostatitis, BPH, Crohn's disease, presenting to emergency department with concern for rectal prostate pain.  Patient reports symptoms been ongoing for about a month.  Describes sensation of fullness near his perineum and inside his rectum, which worsens when he performs digital rectal exam at home.  He has urology appointment scheduled for this month for March 25.  Overcomes the ER today complaining of worsening pain.  He says he simply cannot wait.  He is able to urinate per usual, though he feels extremely be weaker.  He denies any hesitation or difficulty emptying his bladder.  He does not feel like urinating specifically makes his pain any worse.  He has loose bowel movements daily due to his Crohn's disease.  He "sometimes" has worsening pain with his defecation but not always.  He does not feel like he is having a Crohn's flare.  He said that is a very specific alternative feeling, more abdominal pain.  He denies fevers or chills.  He is in a monogamous relationship with his wife for 12 years.  No new sexual partners.  No hx of receptive anal intercourse.  HPI     Past Medical History:  Diagnosis Date  . Acute prostatitis 07/24/2007   Qualifier: Diagnosis of  By: [name] MD, [name]   . Allergy    mild  . Arthritis    osteoarthritis  . Asthma   . Blood transfusion without reported diagnosis   . BPH (benign prostatic hypertrophy) with urinary obstruction   . Crohn's ileitis ([place]) suspected 05/03/2017  . Dilated aortic root ([place])    noted on echo 08/2012  . Diverticulitis of colon   . EPIDIDYMITIS 02/15/2010   Qualifier: Diagnosis of  By: [name] MD, [name]   . GERD (gastroesophageal reflux disease)   . H/O: GI bleed   . Hemorrhoids   . Hepatitis  1975   unknown type   . HERPES SIMPLEX INFECTION 10/14/2007   Qualifier: Diagnosis of  By: [name] MD, [name]   . Hiatal hernia   . Ileus following gastrointestinal surgery ([place]) 12/26/2011  . Psoriasis    sees [name] at [facility].  . Recurrent ventral incisional hernia 05/10/2012  . SVT (supraventricular tachycardia) ([place])   . Ulcer 08/21/2013   ileal    Patient Active Problem List   Diagnosis Date Noted  . External hemorrhoid, thrombosed 05/27/2019  . 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  . Long term (current) use of systemic steroids 06/18/2018  . Drug-induced acute pancreatitis without infection or necrosis - from 6 MP 09/24/2017  . Long-term use of immunosuppressant medication  - Entyvio 06/28/2017  . Crohn's ileitis ([place])  05/03/2017  . Psoriasis 08/23/2016  . Insomnia 04/22/2014  . Enlarged thoracic aorta ([place]) 09/06/2012  . SVT (supraventricular tachycardia) ([place]) 08/04/2012  . Vitamin D deficiency 06/18/2012  . ADHD 05/28/2009  . BENIGN PROSTATIC HYPERTROPHY 07/24/2007  . Asthma 03/06/2007  . GERD 03/06/2007  . DIVERTICULITIS, HX OF 03/06/2007    Past Surgical History:  Procedure Laterality Date  . BOWEL RESECTION  12/19/2011   Procedure: SMALL BOWEL RESECTION;  Surgeon: [name], MD;  [facility];  Service: General;  Laterality: N/A;  with anastamosis and insertion mesh  .  BRONCHOSCOPY    . COLON SURGERY  01/2004   x 2  . COLONOSCOPY W/ BIOPSIES  04/26/2017   per [name], no polyps, benign inflammation, repeat in 5 yrs   . CYSTOSCOPY    . ESOPHAGOGASTRODUODENOSCOPY    . HEMICOLECTOMY     left side, at [facility], diverticulitis  . HEMORRHOID BANDING    . HERNIA REPAIR     [phone] incisional hernia  . ILEOSTOMY    . ILEOSTOMY CLOSURE    . INSERTION OF MESH  07/31/2012   Procedure: INSERTION OF MESH;  Surgeon: [name], MD;  [facility];  Service: General;;  . LAPAROTOMY  12/19/2011   Procedure: EXPLORATORY  LAPAROTOMY;  Surgeon: [name], MD;  [facility];  Service: General;  Laterality: N/A;  . TONSILLECTOMY    . UPPER GASTROINTESTINAL ENDOSCOPY    . VASECTOMY    . VENTRAL HERNIA REPAIR  07/31/2012   Procedure: HERNIA REPAIR VENTRAL ADULT;  Surgeon: [name], MD;  Location[facility];  Service: General;  Laterality: N/A;       Family History  Problem Relation Age of Onset  . Lung cancer Mother   . Leukemia Father   . Hypertension Father   . Heart disease Father   . Heart attack Father   . Prostate cancer Father   . Prostate cancer Paternal Uncle   . Colon cancer Neg Hx   . Stomach cancer Neg Hx   . Colon polyps Neg Hx   . Esophageal cancer Neg Hx   . Rectal cancer Neg Hx     Social History   Tobacco Use  . Smoking status: Never Smoker  . Smokeless tobacco: Never Used  Substance Use Topics  . Alcohol use: Yes    Alcohol/week: 0.0 standard drinks    Comment: occ  . Drug use: No    Home Medications Prior to Admission medications   Medication Sig Start Date End Date Taking? Authorizing Provider  albuterol (PROVENTIL HFA;VENTOLIN HFA) 108 (90 Base) MCG/ACT inhaler INHALE 2 PUFFS INTO THE LUNGS EVERY 4 (FOUR) HOURS AS NEEDED FOR WHEEZING OR SHORTNESS OF BREATH. 10/24/18   [name], MD  budesonide (ENTOCORT EC) 3 MG 24 hr capsule Take 9 mg by mouth daily.    [provider]  ciprofloxacin (CIPRO) 500 MG tablet Take 1 tablet (500 mg total) by mouth every 12 (twelve) hours for 14 days. 09/27/19 10/11/19  Wyvonnia Dusky, MD  diphenoxylate-atropine (LOMOTIL) 2.5-0.025 MG tablet 1-2 tablets 4 times a day as neded Patient taking differently: Take 1 tablet by mouth 4 (four) times daily as needed for diarrhea or loose stools.  05/06/18   Gatha Mayer, MD  EPINEPHrine (EPIPEN 2-PAK) 0.3 mg/0.3 mL IJ SOAJ injection Inject 0.3 mLs (0.3 mg total) into the muscle as needed for anaphylaxis. 11/09/18   Sherwood Gambler, MD  esomeprazole (NEXIUM) 40 MG  capsule Take 1 capsule (40 mg total) by mouth daily before breakfast. 09/07/18   Gatha Mayer, MD  halobetasol (ULTRAVATE) 0.05 % ointment Apply topically 2 (two) times daily. Patient taking differently: Apply 1 application topically 2 (two) times daily as needed (for skin infections).  01/08/18   [name], MD  hyoscyamine (LEVSIN SL) 0.125 MG SL tablet PLACE 1 TABLET UNDER THE TONGUE EVERY 4 HOURS AS NEEDED FOR CRAMPING (URGENT DEFECATION) Patient taking differently: Take 0.125 mg by mouth every 4 (four) hours as needed for cramping.  10/19/17   Carlean Purl,  Ofilia Neas, MD  ketoconazole (NIZORAL) 2 % cream APPLY TWICE A DAY AS NEEDED FOR FUNGAL INFECTION 04/11/19   [name], MD  Multiple Vitamins-Minerals (IMMUNE SUPPORT VITAMIN C PO) Take 1 tablet by mouth daily.    [provider]  oxyCODONE-acetaminophen (PERCOCET/ROXICET) 5-325 MG tablet Take 1 tablet by mouth every 6 (six) hours as needed for up to 6 doses for severe pain. 09/26/19   Wyvonnia Dusky, MD  pramoxine-hydrocortisone [facility]) 1-1 % rectal cream Place 1 application rectally 2 (two) times daily. Use twice daily before SITZ baths 04/21/19   Milus Banister, MD  tadalafil (CIALIS) 5 MG tablet Take 1 tablet (5 mg total) by mouth daily. Patient taking differently: Take 5 mg by mouth daily as needed for erectile dysfunction.  11/23/17   [name], MD  traZODone (DESYREL) 100 MG tablet TAKE ONE HALF TABLETS (50 MG TOTAL) BY MOUTH AT BEDTIME. 05/16/19   [name], MD  vedolizumab (ENTYVIO) 300 MG injection Entyvio 300 mg IV on week 0, week 2, week 6 then q 8 weeks 03/25/19   Gatha Mayer, MD    Allergies    Mercaptopurine, Shellfish allergy, and Humira [adalimumab]  Review of Systems   Review of Systems  Constitutional: Negative for chills and fever.  Respiratory: Negative for cough and shortness of breath.   Cardiovascular: Negative for chest pain and palpitations.  Gastrointestinal: Negative for abdominal  pain, anal bleeding, blood in stool, constipation, nausea and vomiting.  Genitourinary: Negative for decreased urine volume, difficulty urinating, discharge, dysuria, genital sores, hematuria, penile pain, penile swelling, scrotal swelling and testicular pain.  Skin: Negative for pallor and rash.  Neurological: Negative for syncope and headaches.  All other systems reviewed and are negative.   Physical Exam Updated Vital Signs BP (!) 152/65 (BP Location: Left Arm)   Pulse 67   Temp 98.1 F (36.7 C) (Oral)   Resp 18   Ht 5' 10"  (1.778 m)   Wt 101.6 kg   SpO2 95%   BMI 32.14 kg/m   Physical Exam Vitals and nursing note reviewed.  Constitutional:      Appearance: He is well-developed.  HENT:     Head: Normocephalic and atraumatic.  Eyes:     Conjunctiva/sclera: Conjunctivae normal.  Cardiovascular:     Rate and Rhythm: Normal rate and regular rhythm.  Pulmonary:     Effort: Pulmonary effort is normal. No respiratory distress.  Genitourinary:    Comments: GU exam performed with chaperone Retia Passe in the room Normal external male genitalia, circumsized. Symmetric and intact bilateral cremasteric reflexes. No penile lesions, rashes, crepitus, drainage, or suspicious lymph nodes. No testicular tenderness or swelling. No epididymal tenderness or swelling. Rectal exam with mild prostate tenderness and fullness, no fluctuant pocket palpable on exam, no discharge   Musculoskeletal:     Comments: No skin discoloration or crepitus of the scrotum or perineum  Skin:    General: Skin is warm and dry.  Neurological:     Mental Status: He is alert.  Psychiatric:        Mood and Affect: Mood normal.        Behavior: Behavior normal.     ED Results / Procedures / Treatments   Labs (all labs ordered are listed, but only abnormal results are displayed) Labs Reviewed  URINALYSIS, ROUTINE W REFLEX MICROSCOPIC - Abnormal; Notable for the following components:      Result Value    Ketones, ur 15 (*)  All other components within normal limits  URINE CULTURE    EKG None  Radiology No results found.  Procedures Procedures (including critical care time)  Medications Ordered in ED Medications  ciprofloxacin (CIPRO) tablet 500 mg (500 mg Oral Given 09/26/19 1752)    ED Course  I have reviewed the triage vital signs and the nursing notes.  Pertinent labs & imaging results that were available during my care of the patient were reviewed by me and considered in my medical decision making (see chart for details).  69 year old male with a history of prostatitis presenting to the ED with complaint of prostate fullness.  He does have some mild prostate tenderness on exam.  The localization of his symptoms could certainly be consistent with a prostatitis.  We will check a UA and send a urine culture.  No evidence of forneire's gangrene or significant skin/soft tissue infection on exam.  He is afebrile here.  No evidence of testicular torsion or epididymitis on exam.  With crohn's disease, he is higher risk for fistula and abscess formation, but without fever or such palpation source of infection, or rectal drainage, I do not think he needs an urgent CT scan.  We'll discuss this possibility, and if he's not improving after antibiotics, he'll need to discuss further workup or imaging with his GI doctor.  Clinical Course as of Sep 26 1802  Fri Sep 26, 2019  1747 UA negative.  Discussed the patient possibility of prostatitis versus another intra-abdominal infection or even an abscess.  Is never had an abscess or fistula problems Crohn's before.  I think is reasonable to begin treatment with ciprofloxacin here for prostatitis.  We will start him on a 14 day course and have him f/u with his urologist near the end of the course at [facility], to determine if it needs to be extended.  We discussed the black box warnings with cipro, but he is not on steroids, and he's been on it in the  past without problems.  I feel this is a better antibiotic choice because it would also offer better coverage for abscess or GI infection, if there is an alternative cause of his symptoms.  If he's not getting better in 3 days pain-wise, I advised he also call his GI doctor and discuss workup for crohn's in conjunction with his current treatment.   [MT]    Clinical Course User Index [MT] Imanii Gosdin, Carola Rhine, MD    Final Clinical Impression(s) / ED Diagnoses Final diagnoses:  Pelvic pain    Rx / DC Orders ED Discharge Orders         Ordered    ciprofloxacin (CIPRO) 500 MG tablet  Every 12 hours     09/26/19 1754    oxyCODONE-acetaminophen (PERCOCET/ROXICET) 5-325 MG tablet  Every 6 hours PRN     09/26/19 1754           Wyvonnia Dusky, MD 09/26/19 [phone]

## 2019-09-26 NOTE — ED Notes (Signed)
ED Provider at bedside. 

## 2019-09-26 NOTE — Discharge Instructions (Addendum)
We started treatment for presumed prostatitis in the emergency department today.  As explained, urine cultures are still pending.  You should follow-up with your urologist as scheduled on March 25, and have your doctor determine whether they need to continue or extend her antibiotic course.  We discussed the possibility of an alternative cause of your pain, such as an abscess or infection, which is more common with Crohn's disease.  I did not think you needed an urgent CT scan in the ER, because you were clinically well appearing. I felt it was reasonable to treat you first for prostatitis, and to see if you got better.  If you are not improving with your pain after 2-3 days, you should call your GI doctor and discuss the possibility of an office evaluation for abscess or fistula.  This may involve an outpatient CT scan, if you doctor deems that necessary.    Finally, we discussed antibiotic choices for your treatment.  We settled on ciprofloxacin, which is a fluoroquinolone class of drugs.  I felt this was a good option because it can treat both prostatitis and intra-abdominal infections as well.  We discussed the side effects of these medications, including tendon rupture.  You told me you had had this medicine before, and we agreed it would be a reasonable option to try now.  If you experience any concerning side effects (see package), call your primary care doctor.

## 2019-09-28 LAB — URINE CULTURE: Culture: <10000 — AB

## 2019-09-30 ENCOUNTER — Other Ambulatory Visit: Payer: Self-pay

## 2019-09-30 ENCOUNTER — Encounter: Payer: Self-pay | Admitting: Internal Medicine

## 2019-09-30 ENCOUNTER — Ambulatory Visit: Payer: BLUE CROSS/BLUE SHIELD | Admitting: Internal Medicine

## 2019-09-30 ENCOUNTER — Other Ambulatory Visit (INDEPENDENT_AMBULATORY_CARE_PROVIDER_SITE_OTHER): Payer: BLUE CROSS/BLUE SHIELD

## 2019-09-30 VITALS — BP 148/70 | HR 60 | Temp 98.4°F | Ht 70.5 in | Wt 221.0 lb

## 2019-09-30 DIAGNOSIS — R102 Pelvic and perineal pain: Secondary | ICD-10-CM

## 2019-09-30 DIAGNOSIS — Z79899 Other long term (current) drug therapy: Secondary | ICD-10-CM | POA: Diagnosis not present

## 2019-09-30 DIAGNOSIS — Z7960 Long term (current) use of unspecified immunomodulators and immunosuppressants: Secondary | ICD-10-CM

## 2019-09-30 DIAGNOSIS — K50018 Crohn's disease of small intestine with other complication: Secondary | ICD-10-CM

## 2019-09-30 DIAGNOSIS — K6289 Other specified diseases of anus and rectum: Secondary | ICD-10-CM

## 2019-09-30 LAB — CBC WITH DIFFERENTIAL/PLATELET
Basophils Absolute: 0 10*3/uL (ref 0.0–0.1)
Basophils Relative: 0.5 % (ref 0.0–3.0)
Eosinophils Absolute: 0.2 10*3/uL (ref 0.0–0.7)
Eosinophils Relative: 2.2 % (ref 0.0–5.0)
HCT: 46.4 % (ref 39.0–52.0)
Hemoglobin: 15.6 g/dL (ref 13.0–17.0)
Lymphocytes Relative: 27.3 % (ref 12.0–46.0)
Lymphs Abs: 2.4 10*3/uL (ref 0.7–4.0)
MCHC: 33.6 g/dL (ref 30.0–36.0)
MCV: 87.2 fl (ref 78.0–100.0)
Monocytes Absolute: 0.6 10*3/uL (ref 0.1–1.0)
Monocytes Relative: 7.3 % (ref 3.0–12.0)
Neutro Abs: 5.5 10*3/uL (ref 1.4–7.7)
Neutrophils Relative %: 62.7 % (ref 43.0–77.0)
Platelets: 210 10*3/uL (ref 150.0–400.0)
RBC: 5.32 Mil/uL (ref 4.22–5.81)
RDW: 13.9 % (ref 11.5–15.5)
WBC: 8.8 10*3/uL (ref 4.0–10.5)

## 2019-09-30 LAB — COMPREHENSIVE METABOLIC PANEL
ALT: 17 U/L (ref 0–53)
AST: 33 U/L (ref 0–37)
Albumin: 4.2 g/dL (ref 3.5–5.2)
Alkaline Phosphatase: 60 U/L (ref 39–117)
BUN: 11 mg/dL (ref 6–23)
CO2: 27 mEq/L (ref 19–32)
Calcium: 9.2 mg/dL (ref 8.4–10.5)
Chloride: 105 mEq/L (ref 96–112)
Creatinine, Ser: 1.03 mg/dL (ref 0.40–1.50)
GFR: 72.09 mL/min (ref >60.00)
Glucose, Bld: 94 mg/dL (ref 70–99)
Potassium: 3.8 mEq/L (ref 3.5–5.1)
Sodium: 139 mEq/L (ref 135–145)
Total Bilirubin: 0.7 mg/dL (ref 0.2–1.2)
Total Protein: 7.4 g/dL (ref 6.0–8.3)

## 2019-09-30 MED ORDER — OXYCODONE-ACETAMINOPHEN 5-325 MG PO TABS: 1.0000 | ORAL_TABLET | Freq: Four times a day (QID) | ORAL | 0 refills | Status: AC | PRN

## 2019-09-30 NOTE — Assessment & Plan Note (Signed)
Unless this perineal and rectal pain is a complication of his Crohn's disease he has improved overall on Entyvio.  We will continue barring problems which may be identified on the CT of the pelvis.

## 2019-09-30 NOTE — Patient Instructions (Signed)
Your provider has requested that you go to the basement level for lab work before leaving today. Press "B" on the elevator. The lab is located at the first door on the left as you exit the elevator.   We have sent the following medications to your pharmacy for you to pick up at your convenience: Percocet  You have been scheduled for a CT scan of the pelvis at [facility].  You are scheduled on 10/03/2019 at 11:00AM. You should arrive 15 minutes prior to your appointment time for registration. Please follow the written instructions below on the day of your exam:  WARNING: IF YOU ARE ALLERGIC TO IODINE/X-RAY DYE, PLEASE NOTIFY RADIOLOGY IMMEDIATELY AT [phone]! YOU WILL BE GIVEN A 13 HOUR PREMEDICATION PREP.  1) Do not eat or drink anything after 7:00AM (4 hours prior to your test) 2) You have been given 2 bottles of oral contrast to drink. The solution may taste better if refrigerated, but do NOT add ice or any other liquid to this solution. Shake well before drinking.    Drink 1 bottle of contrast @ 9:00AM (2 hours prior to your exam)  Drink 1 bottle of contrast @ 10:00AM (1 hour prior to your exam)  You may take any medications as prescribed with a small amount of water, if necessary. If you take any of the following medications: METFORMIN, GLUCOPHAGE, GLUCOVANCE, AVANDAMET, RIOMET, FORTAMET, ACTOPLUS MET, JANUMET, GLUMETZA or METAGLIP, you MAY be asked to HOLD this medication 48 hours AFTER the exam.  The purpose of you drinking the oral contrast is to aid in the visualization of your intestinal tract. The contrast solution may cause some diarrhea. Depending on your individual set of symptoms, you may also receive an intravenous injection of x-ray contrast/dye. Plan on being at [facility] for 30 minutes or longer, depending on the type of exam you are having performed.  This test typically takes 30-45 minutes to complete.  If you have any questions regarding your exam or  if you need to reschedule, you may call the CT department at [phone]  between the hours of 8:00 am and 5:00 pm, Monday-Friday.  ________________________________________________________________________   I appreciate the opportunity to care for you. Carl Gessner, MD, FACG  

## 2019-09-30 NOTE — Progress Notes (Signed)
[name] 70 y.o. 14-Jun-1951 [id number]  Assessment & Plan:   Encounter Diagnoses  Name Primary?  . Perineal pain in male Yes  . Rectal pain   . Crohn's disease of ileum with other complication ([place])   . Long-term use of immunosuppressant medication  - Entyvio    Evaluate with labs and CT of the pelvis.  This could be prostatitis though his urinalysis was negative, so we will continue Cipro.  I think given that he has Crohn's disease is immunosuppressed he needs to have imaging.  I suppose it could be a bony injury as well but I am not aware of any trauma.  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  We will continue barring problems which may be identified on the CT of the pelvis.  CC: [name], MD Leigh Aurora, MD  Subjective:   Chief Complaint: Rectal pain and perineal pain  HPI [name] is here reporting about a months worth of pain in the perineum "towards the scrotum".  Urination is fine.  There are some pain with defecation.  As far as his Crohn's disease he has been on Entyvio since the fall so about 6 months and he thinks he is 90 to 95% better though he still has loose stools at x2-3 bowel movements a day.  He is off budesonide.  He went to the emergency department on March 12 where urinalysis was negative except for a small amount of ketones and a urine culture did not grow anything significant.  There was a low colony count of something.  He was placed on Cipro.  No other labs or imaging was done.  The pain is a pressure that intensifies and is made sleep difficult until he was prescribed some Percocet and he would like a refill on that.  He is never really had anything quite like this before.  He feels weak and tired but no fever or chills.  Last colonoscopy 2018 with normal colon and TI inflammation  He has been considering retirement but has held off because of  coverage of medications, both he and his wife take biologic therapy for their autoimmune diseases.  He does not have a urologist currently, [facility] and says [facility] is switching to [facility] but does not have a name Allergies  Allergen Reactions  . Mercaptopurine Other (See Comments)    Pancreatitis   . Shellfish Allergy Anaphylaxis    Throat started to close  . Humira [Adalimumab]     Developed antibodies   Current Meds  Medication Sig  . albuterol (PROVENTIL HFA;VENTOLIN HFA) 108 (90 Base) MCG/ACT inhaler INHALE 2 PUFFS INTO THE LUNGS EVERY 4 (FOUR) HOURS AS NEEDED FOR WHEEZING OR SHORTNESS OF BREATH.  . ciprofloxacin (CIPRO) 500 MG tablet Take 1 tablet (500 mg total) by mouth every 12 (twelve) hours for 14 days.  . diphenoxylate-atropine (LOMOTIL) 2.5-0.025 MG tablet 1-2 tablets 4 times a day as neded (Patient taking differently: Take 1 tablet by mouth 4 (four) times daily as needed for diarrhea or loose stools. )  . EPINEPHrine (EPIPEN 2-PAK) 0.3 mg/0.3 mL IJ SOAJ injection Inject 0.3 mLs (0.3 mg total) into the muscle as needed for anaphylaxis.  Marland Kitchen esomeprazole (NEXIUM) 40 MG capsule Take 1 capsule (40 mg total) by mouth daily before breakfast.  . halobetasol (ULTRAVATE) 0.05 % ointment Apply topically 2 (two) times daily. (Patient  taking differently: Apply 1 application topically 2 (two) times daily as needed (for skin infections). )  . hyoscyamine (LEVSIN SL) 0.125 MG SL tablet PLACE 1 TABLET UNDER THE TONGUE EVERY 4 HOURS AS NEEDED FOR CRAMPING (URGENT DEFECATION) (Patient taking differently: Take 0.125 mg by mouth every 4 (four) hours as needed for cramping. )  . ketoconazole (NIZORAL) 2 % cream APPLY TWICE A DAY AS NEEDED FOR FUNGAL INFECTION  . Multiple Vitamins-Minerals (IMMUNE SUPPORT VITAMIN C PO) Take 1 tablet by mouth daily.  Marland Kitchen oxyCODONE-acetaminophen (PERCOCET/ROXICET) 5-325 MG tablet Take 1 tablet by mouth every 6 (six) hours as needed for up to 7 days for severe  pain.  . pramoxine-hydrocortisone (ANALPRAM-HC) 1-1 % rectal cream Place 1 application rectally 2 (two) times daily. Use twice daily before SITZ baths  . tadalafil (CIALIS) 5 MG tablet Take 1 tablet (5 mg total) by mouth daily. (Patient taking differently: Take 5 mg by mouth daily as needed for erectile dysfunction. )  . traZODone (DESYREL) 100 MG tablet TAKE ONE HALF TABLETS (50 MG TOTAL) BY MOUTH AT BEDTIME.  . vedolizumab (ENTYVIO) 300 MG injection Entyvio 300 mg IV on week 0, week 2, week 6 then q 8 weeks  . [DISCONTINUED] budesonide (ENTOCORT EC) 3 MG 24 hr capsule Take 9 mg by mouth daily.  . [DISCONTINUED] oxyCODONE-acetaminophen (PERCOCET/ROXICET) 5-325 MG tablet Take 1 tablet by mouth every 6 (six) hours as needed for up to 6 doses for severe pain.   Past Medical History:  Diagnosis Date  . Acute prostatitis 07/24/2007   Qualifier: Diagnosis of  By: [name] MD, [name]   . Allergy    mild  . Arthritis    osteoarthritis  . Asthma   . Blood transfusion without reported diagnosis   . BPH (benign prostatic hypertrophy) with urinary obstruction   . Crohn's ileitis ([place]) suspected 05/03/2017  . Dilated aortic root ([place])    noted on echo 08/2012  . Diverticulitis of colon   . EPIDIDYMITIS 02/15/2010   Qualifier: Diagnosis of  By: [name] MD, [name]   . GERD (gastroesophageal reflux disease)   . H/O: GI bleed   . Hemorrhoids   . Hepatitis 1975   unknown type   . HERPES SIMPLEX INFECTION 10/14/2007   Qualifier: Diagnosis of  By: [name] MD, [name]   . Hiatal hernia   . Ileus following gastrointestinal surgery ([place]) 12/26/2011  . Long term (current) use of systemic steroids 06/18/2018  . Psoriasis    sees [name] at [facility].  . Recurrent ventral incisional hernia 05/10/2012  . SVT (supraventricular tachycardia) ([place])   . Ulcer 08/21/2013   ileal   Past Surgical History:  Procedure Laterality Date  . BOWEL RESECTION  12/19/2011   Procedure: SMALL BOWEL RESECTION;  Surgeon:  [name], MD;  Location: [facility];  Service: General;  Laterality: N/A;  with anastamosis and insertion mesh  . BRONCHOSCOPY    . COLON SURGERY  01/2004   x 2  . COLONOSCOPY W/ BIOPSIES  04/26/2017   per [name], no polyps, benign inflammation, repeat in 5 yrs   . CYSTOSCOPY    . ESOPHAGOGASTRODUODENOSCOPY    . HEMICOLECTOMY     left side, at [facility], diverticulitis  . HEMORRHOID BANDING    . HERNIA REPAIR     [phone] incisional hernia  . ILEOSTOMY    . ILEOSTOMY CLOSURE    . INSERTION OF MESH  07/31/2012   Procedure: INSERTION OF MESH;  Surgeon: [name], MD;  Location: [facility];  Service: General;;  . LAPAROTOMY  12/19/2011   Procedure: EXPLORATORY LAPAROTOMY;  Surgeon: [name], MD;  Location: [facility];  Service: General;  Laterality: N/A;  . TONSILLECTOMY    . UPPER GASTROINTESTINAL ENDOSCOPY    . VASECTOMY    . 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   Review of Systems As above  Objective:   Physical Exam BP (!) 148/70 (BP Location: Left Arm, Patient Position: Sitting, Cuff Size: Normal)   Pulse 60   Temp 98.4 F (36.9 C)   Ht 5' 10.5" (1.791 m)   Wt 221 lb (100.2 kg)   BMI 31.26 kg/m  Overweight to obese no acute distress The abdomen is soft and nontender  without hernia.  There are well-healed midline surgical scars. Normal circumcised penis scrotum no hernias nontender GU though he is tender on the peritoneum.  I cannot detect any masses or skin changes or anything like that. Rectal exam shows tenderness over the prostate which feels normal otherwise i.e. not boggy or enlarged.  Mild anal stenosis perhaps I am not detecting any fissure.

## 2019-10-03 ENCOUNTER — Ambulatory Visit (HOSPITAL_BASED_OUTPATIENT_CLINIC_OR_DEPARTMENT_OTHER)
Admission: RE | Admit: 2019-10-03 | Discharge: 2019-10-03 | Disposition: A | Discharge status: Home / Self Care | Payer: BLUE CROSS/BLUE SHIELD | Source: Ambulatory Visit | Attending: Internal Medicine | Admitting: Internal Medicine

## 2019-10-03 ENCOUNTER — Other Ambulatory Visit: Payer: Self-pay

## 2019-10-03 DIAGNOSIS — K50018 Crohn's disease of small intestine with other complication: Secondary | ICD-10-CM | POA: Diagnosis not present

## 2019-10-03 DIAGNOSIS — K6289 Other specified diseases of anus and rectum: Secondary | ICD-10-CM | POA: Diagnosis not present

## 2019-10-03 DIAGNOSIS — N400 Enlarged prostate without lower urinary tract symptoms: Secondary | ICD-10-CM | POA: Diagnosis not present

## 2019-10-03 DIAGNOSIS — R102 Pelvic and perineal pain: Secondary | ICD-10-CM

## 2019-10-03 MED ORDER — IOHEXOL 300 MG/ML  SOLN
100.0000 mL | Freq: Once | INTRAMUSCULAR | Status: AC | PRN
  Administered 2019-10-03: 100 mL via INTRAVENOUS

## 2019-10-09 DIAGNOSIS — N410 Acute prostatitis: Secondary | ICD-10-CM | POA: Diagnosis not present

## 2019-10-22 DIAGNOSIS — K5000 Crohn's disease of small intestine without complications: Secondary | ICD-10-CM | POA: Diagnosis not present

## 2019-11-18 ENCOUNTER — Ambulatory Visit (INDEPENDENT_AMBULATORY_CARE_PROVIDER_SITE_OTHER): Payer: BLUE CROSS/BLUE SHIELD | Admitting: Family Medicine

## 2019-11-18 ENCOUNTER — Encounter: Payer: Self-pay | Admitting: Family Medicine

## 2019-11-18 ENCOUNTER — Other Ambulatory Visit: Payer: Self-pay

## 2019-11-18 VITALS — BP 124/62 | HR 64 | Temp 98.4°F | Wt 224.0 lb

## 2019-11-18 DIAGNOSIS — K6289 Other specified diseases of anus and rectum: Secondary | ICD-10-CM

## 2019-11-18 MED ORDER — MESALAMINE 1000 MG RE SUPP: 1000.0000 mg | Freq: Every day | RECTAL | 2 refills | Status: DC

## 2019-11-18 NOTE — Progress Notes (Signed)
   Subjective:    Patient ID: KAPIL PETROPOULOS, male    DOB: July 30, 1950, 69 y.o.   MRN: [id number]  HPI Here for 4 days of pain at the rectum. He has never had this before. His BMs are normal but he has pain when he passes a stool. No bleeding. His Crohns has been very well controlled with Entyvio. No fever.    Review of Systems  Constitutional: Negative.   Respiratory: Negative.   Cardiovascular: Negative.   Gastrointestinal: Positive for rectal pain. Negative for abdominal distention, abdominal pain, anal bleeding, blood in stool, constipation, diarrhea, nausea and vomiting.       Objective:   Physical Exam Constitutional:      Appearance: Normal appearance. He is not ill-appearing.  Cardiovascular:     Rate and Rhythm: Normal rate and regular rhythm.     Pulses: Normal pulses.     Heart sounds: Normal heart sounds.  Pulmonary:     Effort: Pulmonary effort is normal.     Breath sounds: Normal breath sounds.  Genitourinary:    Prostate: Normal.     Comments: The anus appears normal. He is tender on digital exam anteriorly, but no masses are felt Neurological:     Mental Status: He is alert.           Assessment & Plan:  Proctitis, treat with Canasa suppositories until clear.  Alysia Penna, MD

## 2019-11-20 DIAGNOSIS — R972 Elevated prostate specific antigen [PSA]: Secondary | ICD-10-CM | POA: Diagnosis not present

## 2019-11-20 DIAGNOSIS — N410 Acute prostatitis: Secondary | ICD-10-CM | POA: Diagnosis not present

## 2019-12-24 DIAGNOSIS — K5000 Crohn's disease of small intestine without complications: Secondary | ICD-10-CM | POA: Diagnosis not present

## 2020-02-23 DIAGNOSIS — K5000 Crohn's disease of small intestine without complications: Secondary | ICD-10-CM | POA: Diagnosis not present

## 2020-02-25 ENCOUNTER — Other Ambulatory Visit: Payer: Self-pay | Admitting: Family Medicine

## 2020-03-01 ENCOUNTER — Encounter: Payer: Self-pay | Admitting: Family Medicine

## 2020-03-01 ENCOUNTER — Other Ambulatory Visit: Payer: Self-pay

## 2020-03-01 ENCOUNTER — Ambulatory Visit: Payer: BLUE CROSS/BLUE SHIELD | Admitting: Family Medicine

## 2020-03-01 VITALS — BP 140/70 | HR 77

## 2020-03-01 DIAGNOSIS — J019 Acute sinusitis, unspecified: Secondary | ICD-10-CM | POA: Diagnosis not present

## 2020-03-01 MED ORDER — AZITHROMYCIN 250 MG PO TABS: ORAL_TABLET | ORAL | 0 refills | Status: DC

## 2020-03-01 MED ORDER — METHYLPREDNISOLONE 4 MG PO TBPK: ORAL_TABLET | ORAL | 0 refills | Status: DC

## 2020-03-01 NOTE — Progress Notes (Signed)
   Subjective:    Patient ID: [name], male    DOB: 12/24/1950, 69 y.o.   MRN: [id number]  HPI Here for one month of sinus congestion, headache, and PND. He is blowing yellow mucus from the nose. No cough or SOB. No fever or body aches. No NVD. Using Zyrtec and Flonase.   Review of Systems  Constitutional: Negative.   HENT: Positive for congestion, postnasal drip and sinus pressure. Negative for sore throat.   Eyes: Negative.   Respiratory: Negative.   Gastrointestinal: Negative.        Objective:   Physical Exam Constitutional:      General: He is not in acute distress.    Appearance: Normal appearance.  HENT:     Right Ear: Tympanic membrane, ear canal and external ear normal.     Left Ear: Tympanic membrane, ear canal and external ear normal.     Nose: Nose normal.     Mouth/Throat:     Pharynx: Oropharynx is clear.  Eyes:     Conjunctiva/sclera: Conjunctivae normal.  Cardiovascular:     Rate and Rhythm: Normal rate and regular rhythm.     Pulses: Normal pulses.     Heart sounds: Normal heart sounds.  Pulmonary:     Effort: Pulmonary effort is normal.     Breath sounds: Normal breath sounds.  Lymphadenopathy:     Cervical: No cervical adenopathy.  Neurological:     Mental Status: He is alert.           Assessment & Plan:  Sinusitis, treat with a Zpack and a Medrol dose pack. Alysia Penna, MD

## 2020-03-19 DIAGNOSIS — S01501A Unspecified open wound of lip, initial encounter: Secondary | ICD-10-CM | POA: Diagnosis not present

## 2020-03-23 ENCOUNTER — Other Ambulatory Visit: Payer: Self-pay | Admitting: Internal Medicine

## 2020-03-23 DIAGNOSIS — K50018 Crohn's disease of small intestine with other complication: Secondary | ICD-10-CM

## 2020-03-29 ENCOUNTER — Other Ambulatory Visit: Payer: Self-pay

## 2020-03-29 DIAGNOSIS — K50018 Crohn's disease of small intestine with other complication: Secondary | ICD-10-CM

## 2020-04-02 ENCOUNTER — Encounter (HOSPITAL_COMMUNITY): Payer: Self-pay | Admitting: Emergency Medicine

## 2020-04-02 ENCOUNTER — Emergency Department (HOSPITAL_COMMUNITY)
Admission: EM | Admit: 2020-04-02 | Discharge: 2020-04-02 | Disposition: A | Discharge status: Home / Self Care | Payer: BLUE CROSS/BLUE SHIELD | Attending: Emergency Medicine | Admitting: Emergency Medicine

## 2020-04-02 ENCOUNTER — Other Ambulatory Visit: Payer: Self-pay

## 2020-04-02 ENCOUNTER — Emergency Department (HOSPITAL_COMMUNITY): Payer: BLUE CROSS/BLUE SHIELD

## 2020-04-02 DIAGNOSIS — Z7951 Long term (current) use of inhaled steroids: Secondary | ICD-10-CM | POA: Diagnosis not present

## 2020-04-02 DIAGNOSIS — M7989 Other specified soft tissue disorders: Secondary | ICD-10-CM | POA: Diagnosis not present

## 2020-04-02 DIAGNOSIS — S82892A Other fracture of left lower leg, initial encounter for closed fracture: Secondary | ICD-10-CM

## 2020-04-02 DIAGNOSIS — Z79899 Other long term (current) drug therapy: Secondary | ICD-10-CM | POA: Insufficient documentation

## 2020-04-02 DIAGNOSIS — S99912A Unspecified injury of left ankle, initial encounter: Secondary | ICD-10-CM | POA: Diagnosis not present

## 2020-04-02 DIAGNOSIS — S8292XA Unspecified fracture of left lower leg, initial encounter for closed fracture: Secondary | ICD-10-CM | POA: Diagnosis not present

## 2020-04-02 DIAGNOSIS — W108XXA Fall (on) (from) other stairs and steps, initial encounter: Secondary | ICD-10-CM | POA: Diagnosis not present

## 2020-04-02 DIAGNOSIS — J45909 Unspecified asthma, uncomplicated: Secondary | ICD-10-CM | POA: Diagnosis not present

## 2020-04-02 MED ORDER — OXYCODONE-ACETAMINOPHEN 5-325 MG PO TABS
1.0000 | ORAL_TABLET | Freq: Once | ORAL | Status: AC
  Administered 2020-04-02: 1 via ORAL
  Filled 2020-04-02: qty 1

## 2020-04-02 NOTE — ED Triage Notes (Signed)
Per pt, states he slid down a couple of stairs-small abrasion to outside of left ankle-increased pain

## 2020-04-02 NOTE — ED Notes (Signed)
Assumed care of patient at this time, nad noted, sr up x2, bed locked and low, call bell w/I reach.  Will continue to monitor. ° °

## 2020-04-02 NOTE — ED Notes (Signed)
ED Provider at bedside. 

## 2020-04-02 NOTE — Discharge Instructions (Addendum)
Follow-up with Dr. Griffin Basil, orthopedics.  Schedule appointment with him. Continue to wear the boot until you see him. Home care instructions: -- *PRICE in the first 24-48 hours after injury: Protect (with brace, splint, sling), if given by your provider Rest Ice- Do not apply ice pack directly to your skin, place towel or similar between your skin and ice/ice pack. Apply ice for 20 min, then remove for 40 min while awake Compression- Wear brace, elastic bandage, splint as directed by your provider Elevate affected extremity above the level of your heart when not walking around for the first 24-48 hours   Use Ibuprofen (Motrin/Advil) 686m every 6 hours as needed for pain (do not exceed max dose in 24 hours, 24038m  Follow-up instructions: Please follow-up with your primary care provider or the provided orthopedic physician (bone specialist).  Return instructions:  Please return if your toes or feet are numb or tingling, appear gray or blue, or you have severe pain (also elevate the leg and loosen splint or wrap if you were given one) Please return to the Emergency Department if you experience worsening symptoms.  Please return if you have any other emergent concerns. Additional Information:  Your vital signs today were: BP (!) 154/80 (BP Location: Right Arm)   Pulse 64   Temp (!) 97.5 F (36.4 C) (Oral)   Resp 16   SpO2 100%  If your blood pressure (BP) was elevated above 135/85 this visit, please have this repeated by your doctor within one month. -------------

## 2020-04-02 NOTE — ED Provider Notes (Signed)
[facility] DEPT Provider Note   CSN: [id number] Arrival date & time: 04/02/20  1654     History Chief Complaint  Patient presents with  . Ankle Pain    [name] is a 69 y.o. male  W/ past medical history of arthritis, Crohn's, bradycardia presents the emergency department today for left ankle pain.  Patient states that yesterday he fell down a couple of stairs and fell onto his ankle.  States that he hit the outside of his ankle on some stairs.  Did not twist his ankle, did not hear any popping noises.  States that he was unable to sleep throughout the night due to the pain.  No numbness or tingling down into his legs.  States that he is able to walk on it normally.  States that pain is worse on the lateral side.  States that he was normal health before this.  Up-to-date on his tetanus.  Has not taken anything for this.  No relieving or worsening factors.  HPI     Past Medical History:  Diagnosis Date  . Acute prostatitis 07/24/2007   Qualifier: Diagnosis of  By: [name] MD, [name]   . Allergy    mild  . Arthritis    osteoarthritis  . Asthma   . Blood transfusion without reported diagnosis   . BPH (benign prostatic hypertrophy) with urinary obstruction   . Crohn's ileitis ([place]) suspected 05/03/2017  . Dilated aortic root ([place])    noted on echo 08/2012  . Diverticulitis of colon   . EPIDIDYMITIS 02/15/2010   Qualifier: Diagnosis of  By: [name] MD, [name]   . GERD (gastroesophageal reflux disease)   . H/O: GI bleed   . Hemorrhoids   . Hepatitis 1975   unknown type   . HERPES SIMPLEX INFECTION 10/14/2007   Qualifier: Diagnosis of  By: [name] MD, [name]   . Hiatal hernia   . Ileus following gastrointestinal surgery ([place]) 12/26/2011  . Long term (current) use of systemic steroids 06/18/2018  . Psoriasis    sees [name] at [facility].  . Recurrent ventral incisional hernia 05/10/2012  . SVT (supraventricular tachycardia) ([place])   .  Ulcer 08/21/2013   ileal    Patient Active Problem List   Diagnosis Date Noted  . External hemorrhoid, thrombosed 05/27/2019  . 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  . Drug-induced acute pancreatitis without infection or necrosis - from 6 MP 09/24/2017  . Long-term use of immunosuppressant medication  - Entyvio 06/28/2017  . Crohn's ileitis ([place])  05/03/2017  . Psoriasis 08/23/2016  . Insomnia 04/22/2014  . Enlarged thoracic aorta ([place]) 09/06/2012  . SVT (supraventricular tachycardia) ([place]) 08/04/2012  . Vitamin D deficiency 06/18/2012  . ADHD 05/28/2009  . BENIGN PROSTATIC HYPERTROPHY 07/24/2007  . Asthma 03/06/2007  . GERD 03/06/2007  . DIVERTICULITIS, HX OF 03/06/2007    Past Surgical History:  Procedure Laterality Date  . BOWEL RESECTION  12/19/2011   Procedure: SMALL BOWEL RESECTION;  Surgeon: [name], MD;  Location: [facility];  Service: General;  Laterality: N/A;  with anastamosis and insertion mesh  . BRONCHOSCOPY    . COLON SURGERY  01/2004   x 2  . COLONOSCOPY W/ BIOPSIES  04/26/2017   per [name], no polyps, benign inflammation, repeat in 5 yrs   . CYSTOSCOPY    . ESOPHAGOGASTRODUODENOSCOPY    . HEMICOLECTOMY     left side, at [facility], diverticulitis  . HEMORRHOID BANDING    .  HERNIA REPAIR     [phone] incisional hernia  . ILEOSTOMY    . ILEOSTOMY CLOSURE    . INSERTION OF MESH  07/31/2012   Procedure: INSERTION OF MESH;  Surgeon: [name], MD;  Location: [facility];  Service: General;;  . LAPAROTOMY  12/19/2011   Procedure: EXPLORATORY LAPAROTOMY;  Surgeon: [name], MD;  Location: [facility];  Service: General;  Laterality: N/A;  . TONSILLECTOMY    . UPPER GASTROINTESTINAL ENDOSCOPY    . VASECTOMY    . VENTRAL HERNIA REPAIR  07/31/2012   Procedure: HERNIA REPAIR VENTRAL ADULT;  Surgeon: [name], MD;  Location: [facility];  Service: General;  Laterality: N/A;       Family History  Problem Relation Age of Onset  .  Lung cancer Mother   . Leukemia Father   . Hypertension Father   . Heart disease Father   . Heart attack Father   . Prostate cancer Father   . Prostate cancer Paternal Uncle   . Colon cancer Neg Hx   . Stomach cancer Neg Hx   . Colon polyps Neg Hx   . Esophageal cancer Neg Hx   . Rectal cancer Neg Hx     Social History   Tobacco Use  . Smoking status: Never Smoker  . Smokeless tobacco: Never Used  Vaping Use  . Vaping Use: Never used  Substance Use Topics  . Alcohol use: Yes    Alcohol/week: 0.0 standard drinks    Comment: occ  . Drug use: No    Home Medications Prior to Admission medications   Medication Sig Start Date End Date Taking? Authorizing Provider  albuterol (PROVENTIL HFA;VENTOLIN HFA) 108 (90 Base) MCG/ACT inhaler INHALE 2 PUFFS INTO THE LUNGS EVERY 4 (FOUR) HOURS AS NEEDED FOR WHEEZING OR SHORTNESS OF BREATH. 10/24/18   [name], MD  azithromycin (ZITHROMAX Z-PAK) 250 MG tablet As directed 03/01/20   [name], MD  diphenoxylate-atropine (LOMOTIL) 2.5-0.025 MG tablet 1-2 tablets 4 times a day as neded Patient taking differently: Take 1 tablet by mouth 4 (four) times daily as needed for diarrhea or loose stools.  05/06/18   Gatha Mayer, MD  EPINEPHrine (EPIPEN 2-PAK) 0.3 mg/0.3 mL IJ SOAJ injection Inject 0.3 mLs (0.3 mg total) into the muscle as needed for anaphylaxis. 11/09/18   Sherwood Gambler, MD  esomeprazole (NEXIUM) 40 MG capsule Take 1 capsule (40 mg total) by mouth daily before breakfast. 09/07/18   Gatha Mayer, MD  halobetasol (ULTRAVATE) 0.05 % ointment Apply topically 2 (two) times daily. Patient taking differently: Apply 1 application topically 2 (two) times daily as needed (for skin infections).  01/08/18   [name], MD  hyoscyamine (LEVSIN SL) 0.125 MG SL tablet PLACE 1 TABLET UNDER THE TONGUE EVERY 4 HOURS AS NEEDED FOR CRAMPING (URGENT DEFECATION) Patient taking differently: Take 0.125 mg by mouth every 4 (four) hours as needed  for cramping.  10/19/17   Gatha Mayer, MD  ketoconazole (NIZORAL) 2 % cream APPLY TWICE A DAY AS NEEDED FOR FUNGAL INFECTION 04/11/19   [name], MD  mesalamine (CANASA) 1000 MG suppository PLACE 1 SUPPOSITORY (1,000 MG TOTAL) RECTALLY AT BEDTIME. 02/25/20   [name], MD  methylPREDNISolone (MEDROL DOSEPAK) 4 MG TBPK tablet As directed 03/01/20   [name], MD  Multiple Vitamins-Minerals (IMMUNE SUPPORT VITAMIN C PO) Take 1 tablet by mouth daily.    [provider]  pramoxine-hydrocortisone [facility]) 1-1 %  rectal cream Place 1 application rectally 2 (two) times daily. Use twice daily before SITZ baths 04/21/19   Milus Banister, MD  tadalafil (CIALIS) 5 MG tablet Take 1 tablet (5 mg total) by mouth daily. Patient taking differently: Take 5 mg by mouth daily as needed for erectile dysfunction.  11/23/17   [name], MD  traZODone (DESYREL) 100 MG tablet TAKE ONE HALF TABLETS (50 MG TOTAL) BY MOUTH AT BEDTIME. 05/16/19   [name], MD  vedolizumab (ENTYVIO) 300 MG injection INFUSE 300 MG IV AT WEEK 6, THEN EVERY 8 WEEKS THEREAFTER. 03/24/20   Gatha Mayer, MD    Allergies    Mercaptopurine, Shellfish allergy, and Humira [adalimumab]  Review of Systems   Review of Systems  Constitutional: Negative for diaphoresis, fatigue and fever.  Eyes: Negative for visual disturbance.  Respiratory: Negative for shortness of breath.   Cardiovascular: Negative for chest pain.  Gastrointestinal: Negative for nausea and vomiting.  Musculoskeletal: Positive for arthralgias. Negative for back pain, gait problem, joint swelling and myalgias.  Skin: Negative for color change, pallor, rash and wound.  Neurological: Negative for syncope, weakness, light-headedness, numbness and headaches.  Psychiatric/Behavioral: Negative for behavioral problems and confusion.    Physical Exam Updated Vital Signs BP (!) 154/80 (BP Location: Right Arm)   Pulse 64   Temp (!) 97.5 F (36.4  C) (Oral)   Resp 16   SpO2 100%   Physical Exam Constitutional:      General: He is not in acute distress.    Appearance: Normal appearance. He is not ill-appearing, toxic-appearing or diaphoretic.  Cardiovascular:     Rate and Rhythm: Regular rhythm.     Pulses: Normal pulses.     Heart sounds: Normal heart sounds.  Pulmonary:     Effort: Pulmonary effort is normal.     Breath sounds: Normal breath sounds.  Musculoskeletal:        General: Normal range of motion.     Right ankle: Normal.     Left ankle: No swelling, deformity, ecchymosis or lacerations. Tenderness present over the lateral malleolus. Normal range of motion.     Comments: Left ankle with tenderness to lateral malleolus, mild swelling in this area.  Small abrasion to this area as well.  Normal range of motion with normal strength and sensation.  No tenderness elsewhere to midfoot or to lower leg.  Normal cap refill.  PT DP pulses 2+.  No ecchymosis noted.  Able to bear weight and walk on foot.   Skin:    General: Skin is warm and dry.     Capillary Refill: Capillary refill takes less than 2 seconds.  Neurological:     General: No focal deficit present.     Mental Status: He is alert and oriented to person, place, and time.  Psychiatric:        Mood and Affect: Mood normal.        Behavior: Behavior normal.        Thought Content: Thought content normal.     ED Results / Procedures / Treatments   Labs (all labs ordered are listed, but only abnormal results are displayed) Labs Reviewed - No data to display  EKG None  Radiology DG Ankle Complete Left  Result Date: 04/02/2020 CLINICAL DATA:  Injury and pain EXAM: LEFT ANKLE COMPLETE - 3+ VIEW COMPARISON:  None. FINDINGS: There is a nondisplaced lucency seen at the distal fibular tip which could represent a nondisplaced fracture. Overlying  soft tissue swelling is seen. A fragmented ossicle seen adjacent to the medial malleolus. Calcaneal enthesophytes are noted.  A small ankle joint effusion is present. IMPRESSION: Nondisplaced lucency at the distal fibular tip which could represent a nondisplaced fracture. Mild overlying soft tissue swelling. Electronically Signed   By: Prudencio Pair M.D.   On: 04/02/2020 18:41    Procedures Procedures (including critical care time)  Medications Ordered in ED Medications  oxyCODONE-acetaminophen (PERCOCET/ROXICET) 5-325 MG per tablet 1 tablet (1 tablet Oral Given 04/02/20 2018)    ED Course  I have reviewed the triage vital signs and the nursing notes.  Pertinent labs & imaging results that were available during my care of the patient were reviewed by me and considered in my medical decision making (see chart for details).    MDM Rules/Calculators/A&P                           [name] is a 69 y.o. male  W/ past medical history of arthritis, Crohn's, bradycardia presents the emergency department today for left ankle pain.  Patient with tenderness to lateral malleolus, no ecchymosis.  Mild swelling noted.  Patient is distally neurovascular intact, gait normal.  X-ray with concerns for distal fibular tip fracture, nondisplaced.  Fracture is closed.  Will apply cam boot and Ortho follow-up.  Patient agreeable.  Pain controlled with Percocet.  Tetanus is up to date.   Doubt need for further emergent work up at this time. I explained the diagnosis and have given explicit precautions to return to the ER including for any other new or worsening symptoms. The patient understands and accepts the medical plan as it's been dictated and I have answered their questions. Discharge instructions concerning home care and prescriptions have been given. The patient is STABLE and is discharged to home in good condition.   Final Clinical Impression(s) / ED Diagnoses Final diagnoses:  Closed fracture of left ankle, initial encounter    Rx / DC Orders ED Discharge Orders    None       Alfredia Client, PA-C 04/02/20 2055     Drenda Freeze, MD 04/02/20 [phone]

## 2020-04-05 DIAGNOSIS — M25572 Pain in left ankle and joints of left foot: Secondary | ICD-10-CM | POA: Diagnosis not present

## 2020-04-28 DIAGNOSIS — K5000 Crohn's disease of small intestine without complications: Secondary | ICD-10-CM | POA: Diagnosis not present

## 2020-05-18 ENCOUNTER — Other Ambulatory Visit: Payer: Self-pay | Admitting: Family Medicine

## 2020-05-22 ENCOUNTER — Other Ambulatory Visit: Payer: Self-pay | Admitting: Family Medicine

## 2020-05-27 ENCOUNTER — Emergency Department (HOSPITAL_BASED_OUTPATIENT_CLINIC_OR_DEPARTMENT_OTHER): Payer: BLUE CROSS/BLUE SHIELD

## 2020-05-27 ENCOUNTER — Telehealth (INDEPENDENT_AMBULATORY_CARE_PROVIDER_SITE_OTHER): Payer: BLUE CROSS/BLUE SHIELD | Admitting: Family Medicine

## 2020-05-27 ENCOUNTER — Encounter (HOSPITAL_BASED_OUTPATIENT_CLINIC_OR_DEPARTMENT_OTHER): Payer: Self-pay | Admitting: Emergency Medicine

## 2020-05-27 ENCOUNTER — Other Ambulatory Visit: Payer: Self-pay

## 2020-05-27 ENCOUNTER — Emergency Department (HOSPITAL_BASED_OUTPATIENT_CLINIC_OR_DEPARTMENT_OTHER)
Admission: EM | Admit: 2020-05-27 | Discharge: 2020-05-28 | Disposition: A | Discharge status: Home / Self Care | Payer: BLUE CROSS/BLUE SHIELD | Attending: Emergency Medicine | Admitting: Emergency Medicine

## 2020-05-27 DIAGNOSIS — R4182 Altered mental status, unspecified: Secondary | ICD-10-CM | POA: Diagnosis not present

## 2020-05-27 DIAGNOSIS — R479 Unspecified speech disturbances: Secondary | ICD-10-CM | POA: Diagnosis not present

## 2020-05-27 DIAGNOSIS — Z79899 Other long term (current) drug therapy: Secondary | ICD-10-CM | POA: Insufficient documentation

## 2020-05-27 DIAGNOSIS — R42 Dizziness and giddiness: Secondary | ICD-10-CM | POA: Diagnosis not present

## 2020-05-27 DIAGNOSIS — J45909 Unspecified asthma, uncomplicated: Secondary | ICD-10-CM | POA: Diagnosis not present

## 2020-05-27 DIAGNOSIS — R413 Other amnesia: Secondary | ICD-10-CM | POA: Diagnosis not present

## 2020-05-27 LAB — URINALYSIS, ROUTINE W REFLEX MICROSCOPIC
Bilirubin Urine: NEGATIVE
Glucose, UA: NEGATIVE mg/dL
Hgb urine dipstick: NEGATIVE
Ketones, ur: NEGATIVE mg/dL
Leukocytes,Ua: NEGATIVE
Nitrite: NEGATIVE
Protein, ur: NEGATIVE mg/dL
Specific Gravity, Urine: 1.015 (ref 1.005–1.030)
pH: 7 (ref 5.0–8.0)

## 2020-05-27 LAB — COMPREHENSIVE METABOLIC PANEL
ALT: 30 U/L (ref 0–44)
AST: 50 U/L — ABNORMAL HIGH (ref 15–41)
Albumin: 4 g/dL (ref 3.5–5.0)
Alkaline Phosphatase: 58 U/L (ref 38–126)
Anion gap: 9 (ref 5–15)
BUN: 16 mg/dL (ref 8–23)
CO2: 26 mmol/L (ref 22–32)
Calcium: 8.9 mg/dL (ref 8.9–10.3)
Chloride: 103 mmol/L (ref 98–111)
Creatinine, Ser: 0.97 mg/dL (ref 0.61–1.24)
GFR, Estimated: >60 mL/min (ref >60)
Glucose, Bld: 111 mg/dL — ABNORMAL HIGH (ref 70–99)
Potassium: 3.9 mmol/L (ref 3.5–5.1)
Sodium: 138 mmol/L (ref 135–145)
Total Bilirubin: 0.4 mg/dL (ref 0.3–1.2)
Total Protein: 7 g/dL (ref 6.5–8.1)

## 2020-05-27 LAB — CBC WITH DIFFERENTIAL/PLATELET
Abs Immature Granulocytes: 0.03 10*3/uL (ref 0.00–0.07)
Basophils Absolute: 0 10*3/uL (ref 0.0–0.1)
Basophils Relative: 0 %
Eosinophils Absolute: 0.2 10*3/uL (ref 0.0–0.5)
Eosinophils Relative: 2 %
HCT: 45.3 % (ref 39.0–52.0)
Hemoglobin: 15 g/dL (ref 13.0–17.0)
Immature Granulocytes: 0 %
Lymphocytes Relative: 30 %
Lymphs Abs: 3 10*3/uL (ref 0.7–4.0)
MCH: 29.4 pg (ref 26.0–34.0)
MCHC: 33.1 g/dL (ref 30.0–36.0)
MCV: 88.8 fL (ref 80.0–100.0)
Monocytes Absolute: 0.8 10*3/uL (ref 0.1–1.0)
Monocytes Relative: 8 %
Neutro Abs: 5.8 10*3/uL (ref 1.7–7.7)
Neutrophils Relative %: 60 %
Platelets: 201 10*3/uL (ref 150–400)
RBC: 5.1 MIL/uL (ref 4.22–5.81)
RDW: 12.9 % (ref 11.5–15.5)
WBC: 9.9 10*3/uL (ref 4.0–10.5)
nRBC: 0 % (ref 0.0–0.2)

## 2020-05-27 LAB — CBG MONITORING, ED: Glucose-Capillary: 132 mg/dL — ABNORMAL HIGH (ref 70–99)

## 2020-05-27 NOTE — ED Triage Notes (Signed)
Patient presents with complaints of lightheadedness onset several days ago; denies lightheadedness at this time; states "not feeling himself" states issues with long term memory as well. Denies pain. States drove himself here.

## 2020-05-27 NOTE — Progress Notes (Signed)
Virtual Visit via Video Note  I connected with [name]  on 05/27/20 at  1:00 PM EST by a video enabled telemedicine application and verified that I am speaking with the correct person using two identifiers.  Location patient: home, [place] Location provider:work or home office Persons participating in the virtual visit: patient, provider  I discussed the limitations of evaluation and management by telemedicine and the availability of in person appointments. The patient expressed understanding and agreed to proceed.   HPI:  Acute telemedicine visit for ? lightheadeness: -Onset: "the last few days" -Symptoms include: sudden onset of memory issues (can't remember things as well), lightheadeness, feels "not myself", "body is not reacting", fatigue, feels like some word finding difficulty - feels like has difficulty completing sentences more than usual, some coworkers have said he seems a little off -has been under a lot stress - but usually handles stress well -Denies:anxiety, depression, CP, HA, fevers, SOB, palpitations, weakness, numbness, falls, sinus congestion, urinary symptoms, vision changes -Has tried: nothing -Pertinent past medical history: sees cardiologist for bradycardia, also has crohn's and on medications -Pertinent medication allergies:humira, mercaptopurine, humia -COVID-19 vaccine status: fully vaccinated for covid and had booster  ROS: See pertinent positives and negatives per HPI.  Past Medical History:  Diagnosis Date  . Acute prostatitis 07/24/2007   Qualifier: Diagnosis of  By: [name] MD, [name]   . Allergy    mild  . Arthritis    osteoarthritis  . Asthma   . Blood transfusion without reported diagnosis   . BPH (benign prostatic hypertrophy) with urinary obstruction   . Crohn's ileitis ([place]) suspected 05/03/2017  . Dilated aortic root ([place])    noted on echo 08/2012  . Diverticulitis of colon   . EPIDIDYMITIS 02/15/2010   Qualifier: Diagnosis of  By: [name] MD, [name]    . GERD (gastroesophageal reflux disease)   . H/O: GI bleed   . Hemorrhoids   . Hepatitis 1975   unknown type   . HERPES SIMPLEX INFECTION 10/14/2007   Qualifier: Diagnosis of  By: [name] MD, [name]   . Hiatal hernia   . Ileus following gastrointestinal surgery ([place]) 12/26/2011  . Long term (current) use of systemic steroids 06/18/2018  . Psoriasis    sees [name] at [facility].  . Recurrent ventral incisional hernia 05/10/2012  . SVT (supraventricular tachycardia) ([place])   . Ulcer 08/21/2013   ileal    Past Surgical History:  Procedure Laterality Date  . BOWEL RESECTION  12/19/2011   Procedure: SMALL BOWEL RESECTION;  Surgeon: [name], MD;  [facility];  Service: General;  Laterality: N/A;  with anastamosis and insertion mesh  . BRONCHOSCOPY    . COLON SURGERY  01/2004   x 2  . COLONOSCOPY W/ BIOPSIES  04/26/2017   per [name], no polyps, benign inflammation, repeat in 5 yrs   . CYSTOSCOPY    . ESOPHAGOGASTRODUODENOSCOPY    . HEMICOLECTOMY     left side, at [facility], diverticulitis  . HEMORRHOID BANDING    . HERNIA REPAIR     [phone] incisional hernia  . ILEOSTOMY    . ILEOSTOMY CLOSURE    . INSERTION OF MESH  07/31/2012   Procedure: INSERTION OF MESH;  Surgeon: [name], MD;  [facility];  Service: General;;  . LAPAROTOMY  12/19/2011   Procedure: EXPLORATORY LAPAROTOMY;  Surgeon: [name], MD;  [facility];  Service: General;  Laterality: N/A;  . TONSILLECTOMY    .  UPPER GASTROINTESTINAL ENDOSCOPY    . VASECTOMY    . VENTRAL HERNIA REPAIR  07/31/2012   Procedure: HERNIA REPAIR VENTRAL ADULT;  Surgeon: [name], MD;  [facility];  Service: General;  Laterality: N/A;     Current Outpatient Medications:  .  albuterol (PROVENTIL HFA;VENTOLIN HFA) 108 (90 Base) MCG/ACT inhaler, INHALE 2 PUFFS INTO THE LUNGS EVERY 4 (FOUR) HOURS AS NEEDED FOR WHEEZING OR SHORTNESS OF BREATH., Disp: 8.5 Inhaler, Rfl: 2 .   azithromycin (ZITHROMAX Z-PAK) 250 MG tablet, As directed, Disp: 6 each, Rfl: 0 .  diphenoxylate-atropine (LOMOTIL) 2.5-0.025 MG tablet, 1-2 tablets 4 times a day as neded (Patient taking differently: Take 1 tablet by mouth 4 (four) times daily as needed for diarrhea or loose stools. ), Disp: 90 tablet, Rfl: 1 .  EPINEPHrine (EPIPEN 2-PAK) 0.3 mg/0.3 mL IJ SOAJ injection, Inject 0.3 mLs (0.3 mg total) into the muscle as needed for anaphylaxis., Disp: 1 Device, Rfl: 0 .  esomeprazole (NEXIUM) 40 MG capsule, Take 1 capsule (40 mg total) by mouth daily before breakfast., Disp: 30 capsule, Rfl: 2 .  halobetasol (ULTRAVATE) 0.05 % ointment, Apply topically 2 (two) times daily. (Patient taking differently: Apply 1 application topically 2 (two) times daily as needed (for skin infections). ), Disp: 50 g, Rfl: 5 .  hyoscyamine (LEVSIN SL) 0.125 MG SL tablet, PLACE 1 TABLET UNDER THE TONGUE EVERY 4 HOURS AS NEEDED FOR CRAMPING (URGENT DEFECATION) (Patient taking differently: Take 0.125 mg by mouth every 4 (four) hours as needed for cramping. ), Disp: 60 tablet, Rfl: 2 .  ketoconazole (NIZORAL) 2 % cream, APPLY TWICE A DAY AS NEEDED FOR FUNGAL INFECTION, Disp: 30 g, Rfl: 2 .  mesalamine (CANASA) 1000 MG suppository, PLACE 1 SUPPOSITORY (1,000 MG TOTAL) RECTALLY AT BEDTIME., Disp: 90 suppository, Rfl: 3 .  methylPREDNISolone (MEDROL DOSEPAK) 4 MG TBPK tablet, As directed, Disp: 21 tablet, Rfl: 0 .  Multiple Vitamins-Minerals (IMMUNE SUPPORT VITAMIN C PO), Take 1 tablet by mouth daily., Disp: , Rfl:  .  pramoxine-hydrocortisone (ANALPRAM-HC) 1-1 % rectal cream, Place 1 application rectally 2 (two) times daily. Use twice daily before SITZ baths, Disp: 30 g, Rfl: 0 .  tadalafil (CIALIS) 5 MG tablet, Take 1 tablet (5 mg total) by mouth daily. (Patient taking differently: Take 5 mg by mouth daily as needed for erectile dysfunction. ), Disp: 90 tablet, Rfl: 3 .  traZODone (DESYREL) 100 MG tablet, TAKE ONE HALF TABLETS (50  MG TOTAL) BY MOUTH AT BEDTIME., Disp: 45 tablet, Rfl: 11 .  vedolizumab (ENTYVIO) 300 MG injection, INFUSE 300 MG IV AT WEEK 6, THEN EVERY 8 WEEKS THEREAFTER., Disp: 1 each, Rfl: 6  EXAM:  VITALS per patient if applicable:  GENERAL: alert, oriented, appears well and in no acute distress  HEENT: atraumatic, conjunttiva clear, no obvious abnormalities on inspection of external nose and ears  NECK: normal movements of the head and neck  LUNGS: on inspection no signs of respiratory distress, breathing rate appears normal, no obvious gross SOB, gasping or wheezing  CV: no obvious cyanosis  MS: moves all visible extremities without noticeable abnormality  PSYCH/NEURO: pleasant and cooperative, no obvious depression or anxiety, his speech is a little unusual, he does pause often, seems to be searching for words, no significant slurring and thought processing seems to be grossly intact  ASSESSMENT AND PLAN:  Discussed the following assessment and plan:  Difficulty with speech  Memory loss  Lightheadedness  -we discussed possible serious and likely  etiologies, options for evaluation and workup, limitations of telemedicine visit vs in person visit, treatment, treatment risks and precautions.   Odd constellation of symptoms that are concerning.  Advised referral for prompt in-person evaluation and discussed options.  He prefers to be seen by PCP or at the emergency department so that he can have neuro imaging if needed.  After discussion of options, he plans to go to the [facility] or the [facility] emergency department.  He declines transportation.  He agrees to go right away today.  Advised to seek prompt in person care if worsening, new symptoms arise, or if is not improving with treatment. Discussed options for inperson care if PCP office not available. Did let this patient know that I only do telemedicine on Tuesdays and Thursdays for [place]. Advised to schedule follow up visit with  PCP or [facility] if any further questions or concerns to avoid delays in care.   I discussed the assessment and treatment plan with the patient.  Greater than 20 minutes spent on this patient encounter.  The patient was provided an opportunity to ask questions and all were answered. The patient agreed with the plan and demonstrated an understanding of the instructions.     Lucretia Kern, DO

## 2020-05-27 NOTE — ED Provider Notes (Signed)
[facility] EMERGENCY DEPARTMENT Provider Note   CSN: [id number] Arrival date & time: 05/27/20  1930     History Chief Complaint  Patient presents with  . Dizziness    [name] is a 69 y.o. male.  Patient is a 69 year old male with past medical history of Crohn's disease, diverticulitis, asthma, and low heart rate.  Patient presents today for evaluation of lightheadedness and "not feeling like himself".  He has been working on a stressful project at work and states that he is having difficulties remembering the details of this project.  He denies any headache, visual disturbances, weakness, or numbness.  He denies any fevers or chills.  He denies any drug or alcohol use.  He had a telemedicine visit with his primary doctor who advised him to come to the ER to be evaluated.  The history is provided by the patient.  Dizziness Quality:  Lightheadedness Severity:  Moderate Onset quality:  Gradual Duration:  2 days Timing:  Constant Progression:  Worsening Chronicity:  New Relieved by:  Nothing Worsened by:  Nothing Ineffective treatments:  None tried      Past Medical History:  Diagnosis Date  . Acute prostatitis 07/24/2007   Qualifier: Diagnosis of  By: [name] MD, [name]   . Allergy    mild  . Arthritis    osteoarthritis  . Asthma   . Blood transfusion without reported diagnosis   . BPH (benign prostatic hypertrophy) with urinary obstruction   . Crohn's ileitis ([place]) suspected 05/03/2017  . Dilated aortic root ([place])    noted on echo 08/2012  . Diverticulitis of colon   . EPIDIDYMITIS 02/15/2010   Qualifier: Diagnosis of  By: [name] MD, [name]   . GERD (gastroesophageal reflux disease)   . H/O: GI bleed   . Hemorrhoids   . Hepatitis 1975   unknown type   . HERPES SIMPLEX INFECTION 10/14/2007   Qualifier: Diagnosis of  By: [name] MD, [name]   . Hiatal hernia   . Ileus following gastrointestinal surgery ([place]) 12/26/2011  . Long term (current) use of  systemic steroids 06/18/2018  . Psoriasis    sees [name] at [facility].  . Recurrent ventral incisional hernia 05/10/2012  . SVT (supraventricular tachycardia) ([place])   . Ulcer 08/21/2013   ileal    Patient Active Problem List   Diagnosis Date Noted  . External hemorrhoid, thrombosed 05/27/2019  . 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  . Drug-induced acute pancreatitis without infection or necrosis - from 6 MP 09/24/2017  . Long-term use of immunosuppressant medication  - Entyvio 06/28/2017  . Crohn's ileitis ([place])  05/03/2017  . Psoriasis 08/23/2016  . Insomnia 04/22/2014  . Enlarged thoracic aorta ([place]) 09/06/2012  . SVT (supraventricular tachycardia) ([place]) 08/04/2012  . Vitamin D deficiency 06/18/2012  . ADHD 05/28/2009  . BENIGN PROSTATIC HYPERTROPHY 07/24/2007  . Asthma 03/06/2007  . GERD 03/06/2007  . DIVERTICULITIS, HX OF 03/06/2007    Past Surgical History:  Procedure Laterality Date  . BOWEL RESECTION  12/19/2011   Procedure: SMALL BOWEL RESECTION;  Surgeon: [name], MD;  Location: [facility];  Service: General;  Laterality: N/A;  with anastamosis and insertion mesh  . BRONCHOSCOPY    . COLON SURGERY  01/2004   x 2  . COLONOSCOPY W/ BIOPSIES  04/26/2017   per [name], no polyps, benign inflammation, repeat in 5 yrs   . CYSTOSCOPY    . ESOPHAGOGASTRODUODENOSCOPY    .  HEMICOLECTOMY     left side, at [facility], diverticulitis  . HEMORRHOID BANDING    . HERNIA REPAIR     [phone] incisional hernia  . ILEOSTOMY    . ILEOSTOMY CLOSURE    . INSERTION OF MESH  07/31/2012   Procedure: INSERTION OF MESH;  Surgeon: [name], MD;  Location: [facility];  Service: General;;  . LAPAROTOMY  12/19/2011   Procedure: EXPLORATORY LAPAROTOMY;  Surgeon: [name], MD;  Location: [facility];  Service: General;  Laterality: N/A;  . TONSILLECTOMY    . UPPER GASTROINTESTINAL ENDOSCOPY    . VASECTOMY    . VENTRAL HERNIA REPAIR  07/31/2012   Procedure:  HERNIA REPAIR VENTRAL ADULT;  Surgeon: [name], MD;  Location: [facility];  Service: General;  Laterality: N/A;       Family History  Problem Relation Age of Onset  . Lung cancer Mother   . Leukemia Father   . Hypertension Father   . Heart disease Father   . Heart attack Father   . Prostate cancer Father   . Prostate cancer Paternal Uncle   . Colon cancer Neg Hx   . Stomach cancer Neg Hx   . Colon polyps Neg Hx   . Esophageal cancer Neg Hx   . Rectal cancer Neg Hx     Social History   Tobacco Use  . Smoking status: Never Smoker  . Smokeless tobacco: Never Used  Vaping Use  . Vaping Use: Never used  Substance Use Topics  . Alcohol use: Yes    Alcohol/week: 0.0 standard drinks    Comment: occ  . Drug use: No    Home Medications Prior to Admission medications   Medication Sig Start Date End Date Taking? Authorizing Provider  albuterol (PROVENTIL HFA;VENTOLIN HFA) 108 (90 Base) MCG/ACT inhaler INHALE 2 PUFFS INTO THE LUNGS EVERY 4 (FOUR) HOURS AS NEEDED FOR WHEEZING OR SHORTNESS OF BREATH. 10/24/18   [name], MD  azithromycin (ZITHROMAX Z-PAK) 250 MG tablet As directed 03/01/20   [name], MD  diphenoxylate-atropine (LOMOTIL) 2.5-0.025 MG tablet 1-2 tablets 4 times a day as neded Patient taking differently: Take 1 tablet by mouth 4 (four) times daily as needed for diarrhea or loose stools.  05/06/18   Gatha Mayer, MD  EPINEPHrine (EPIPEN 2-PAK) 0.3 mg/0.3 mL IJ SOAJ injection Inject 0.3 mLs (0.3 mg total) into the muscle as needed for anaphylaxis. 11/09/18   Sherwood Gambler, MD  esomeprazole (NEXIUM) 40 MG capsule Take 1 capsule (40 mg total) by mouth daily before breakfast. 09/07/18   Gatha Mayer, MD  halobetasol (ULTRAVATE) 0.05 % ointment Apply topically 2 (two) times daily. Patient taking differently: Apply 1 application topically 2 (two) times daily as needed (for skin infections).  01/08/18   [name], MD  hyoscyamine (LEVSIN SL) 0.125 MG  SL tablet PLACE 1 TABLET UNDER THE TONGUE EVERY 4 HOURS AS NEEDED FOR CRAMPING (URGENT DEFECATION) Patient taking differently: Take 0.125 mg by mouth every 4 (four) hours as needed for cramping.  10/19/17   Gatha Mayer, MD  ketoconazole (NIZORAL) 2 % cream APPLY TWICE A DAY AS NEEDED FOR FUNGAL INFECTION 04/11/19   [name], MD  mesalamine (CANASA) 1000 MG suppository PLACE 1 SUPPOSITORY (1,000 MG TOTAL) RECTALLY AT BEDTIME. 05/18/20   [name], MD  methylPREDNISolone (MEDROL DOSEPAK) 4 MG TBPK tablet As directed 03/01/20   [name], MD  Multiple Vitamins-Minerals (IMMUNE SUPPORT VITAMIN  C PO) Take 1 tablet by mouth daily.    [provider]  pramoxine-hydrocortisone [facility]) 1-1 % rectal cream Place 1 application rectally 2 (two) times daily. Use twice daily before SITZ baths 04/21/19   Milus Banister, MD  tadalafil (CIALIS) 5 MG tablet Take 1 tablet (5 mg total) by mouth daily. Patient taking differently: Take 5 mg by mouth daily as needed for erectile dysfunction.  11/23/17   [name], MD  traZODone (DESYREL) 100 MG tablet TAKE ONE HALF TABLETS (50 MG TOTAL) BY MOUTH AT BEDTIME. 05/24/20   [name], MD  vedolizumab (ENTYVIO) 300 MG injection INFUSE 300 MG IV AT WEEK 6, THEN EVERY 8 WEEKS THEREAFTER. 03/24/20   Gatha Mayer, MD    Allergies    Mercaptopurine, Shellfish allergy, and Humira [adalimumab]  Review of Systems   Review of Systems  Neurological: Positive for dizziness.  All other systems reviewed and are negative.   Physical Exam Updated Vital Signs BP (!) 169/75 (BP Location: Right Arm)   Pulse (!) 56   Temp 98.2 F (36.8 C) (Oral)   Resp 12   Ht 5' 10.5" (1.791 m)   Wt 99.8 kg   SpO2 97%   BMI 31.12 kg/m   Physical Exam Vitals and nursing note reviewed.  Constitutional:      General: He is not in acute distress.    Appearance: He is well-developed. He is not diaphoretic.  HENT:     Head: Normocephalic and atraumatic.    Eyes:     Extraocular Movements: Extraocular movements intact.     Pupils: Pupils are equal, round, and reactive to light.  Cardiovascular:     Rate and Rhythm: Normal rate and regular rhythm.     Heart sounds: No murmur heard.  No friction rub.  Pulmonary:     Effort: Pulmonary effort is normal. No respiratory distress.     Breath sounds: Normal breath sounds. No wheezing or rales.  Abdominal:     General: Bowel sounds are normal. There is no distension.     Palpations: Abdomen is soft.     Tenderness: There is no abdominal tenderness.  Musculoskeletal:        General: Normal range of motion.     Cervical back: Normal range of motion and neck supple.  Skin:    General: Skin is warm and dry.  Neurological:     General: No focal deficit present.     Mental Status: He is alert and oriented to person, place, and time.     Cranial Nerves: No cranial nerve deficit.     Motor: No weakness.     Coordination: Coordination normal.     ED Results / Procedures / Treatments   Labs (all labs ordered are listed, but only abnormal results are displayed) Labs Reviewed  COMPREHENSIVE METABOLIC PANEL - Abnormal; Notable for the following components:      Result Value   Glucose, Bld 111 (*)    AST 50 (*)    All other components within normal limits  CBG MONITORING, ED - Abnormal; Notable for the following components:   Glucose-Capillary 132 (*)    All other components within normal limits  URINALYSIS, ROUTINE W REFLEX MICROSCOPIC  CBC WITH DIFFERENTIAL/PLATELET    EKG EKG Interpretation  Date/Time:  Thursday May 27 2020 20:36:19 EST Ventricular Rate:  71 PR Interval:    QRS Duration: 147 QT Interval:  417 QTC Calculation: 454 R Axis:   -60  Text Interpretation: Sinus rhythm RBBB and LAFB Left ventricular hypertrophy Baseline wander in lead(s) II Unchanged from 01/19/2019 Confirmed by Veryl Speak [phone]) on 05/27/2020 11:14:18 PM   Radiology No results  found.  Procedures Procedures (including critical care time)  Medications Ordered in ED Medications - No data to display  ED Course  I have reviewed the triage vital signs and the nursing notes.  Pertinent labs & imaging results that were available during my care of the patient were reviewed by me and considered in my medical decision making (see chart for details).    MDM Rules/Calculators/A&P  Patient presenting today with complaints as described in the HPI.  He describes feeling lightheaded and having memory issues.  Patient's physical examination is unremarkable, vital signs are stable, laboratory studies are basically normal and head CT is negative.  I am uncertain as to the exact etiology of the patient's symptoms, however nothing tonight appears emergent.  He does describe being under stress working on a project for work and symptoms may be related to this.  Either way, I do not feel as though any further work-up is indicated tonight and that he can follow-up with his primary doctor in the next week.  Final Clinical Impression(s) / ED Diagnoses Final diagnoses:  None    Rx / DC Orders ED Discharge Orders    None       Veryl Speak, MD 05/28/20 0030

## 2020-05-28 NOTE — Discharge Instructions (Addendum)
Follow-up with your primary doctor in the next week if symptoms or not improving, and return to the ER if symptoms significantly worsen or change.

## 2020-05-31 ENCOUNTER — Telehealth: Payer: Self-pay | Admitting: Internal Medicine

## 2020-05-31 NOTE — Telephone Encounter (Signed)
I spoke to [name] and his company wants him to come back into the office. He has been working remotely for the whole pandemic. He needs a letter validating that he has a medical condition (crohn's) and is on a biologic [employer])  and he feels that at age 69 this is not a safe thing for him to do. Please advise Sir, thank you.

## 2020-06-01 ENCOUNTER — Encounter: Payer: Self-pay | Admitting: Internal Medicine

## 2020-06-01 NOTE — Telephone Encounter (Signed)
Letter done

## 2020-06-16 NOTE — Progress Notes (Deleted)
CARDIOLOGY CONSULT NOTE       Patient ID: [name] MRN: [id number] DOB/AGE: August 12, 1950 69 y.o.  Admit date: (Not on file) Referring Physician: [name] Primary Physician: [name], MD Primary Cardiologist: New Reason for Consultation: Abnormal ECG   Active Problems:   * No active hospital problems. *   HPI:  69 y.o. 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 Acute prostatitis 07/24/2007   Qualifier: Diagnosis of  By: [name] MD, [name]   . Allergy    mild  . Arthritis    osteoarthritis  . Asthma   . Blood transfusion without reported diagnosis   . BPH (benign prostatic hypertrophy) with urinary obstruction   . Crohn's ileitis ([place]) suspected 05/03/2017  . Dilated aortic root ([place])    noted on echo 08/2012  . Diverticulitis of colon   . EPIDIDYMITIS 02/15/2010   Qualifier: Diagnosis of  By: [name] MD, [name]   . GERD (gastroesophageal reflux disease)   . H/O: GI bleed   . Hemorrhoids   . Hepatitis 1975   unknown type   . HERPES SIMPLEX INFECTION 10/14/2007   Qualifier: Diagnosis of  By: [name] MD, [name]   . Hiatal hernia   . Ileus following gastrointestinal surgery ([place]) 12/26/2011  . Long term (current) use of systemic steroids 06/18/2018  . Psoriasis    sees [name] at [facility].  . Recurrent ventral incisional hernia 05/10/2012  . SVT (supraventricular tachycardia) ([place])   . Ulcer 08/21/2013   ileal    Family History  Problem Relation Age of Onset  . Lung cancer Mother   . Leukemia Father   . Hypertension Father   . Heart disease Father   . Heart attack Father   . Prostate cancer Father   . Prostate cancer Paternal Uncle   . Colon cancer Neg Hx   . Stomach cancer Neg Hx   . Colon polyps Neg Hx   . Esophageal cancer Neg Hx   . Rectal cancer Neg Hx     Social History   Socioeconomic History  . Marital status: Married    Spouse name: Not on file  . Number of children: 2  . Years of education: Not on file  . Highest education level: Not on file  Occupational History  . 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 Smoking status: Never Smoker  . Smokeless tobacco: Never Used  Vaping Use  . Vaping Use: Never used  Substance and Sexual Activity  . Alcohol use: Yes    Alcohol/week: 0.0 standard drinks    Comment: occ  . Drug use: No  . Sexual  activity: Not on file  Other Topics Concern  . 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 Difficulty of Paying Living Expenses: Not on file  Food Insecurity:   . Worried About Charity fundraiser in the Last Year: Not on file  . Ran Out of Food in the Last Year: Not on file  Transportation Needs:   . Lack of Transportation (Medical): Not on file  . Lack of Transportation (Non-Medical): Not on file  Physical Activity:   . Days of Exercise per Week: Not on file  . Minutes of Exercise per Session: Not  on file  Stress:   . Feeling of Stress : Not on file  Social Connections:   . Frequency of Communication with Friends and Family: Not on file  . Frequency of Social Gatherings with Friends and Family: Not on file  . Attends Religious Services: Not on file  . Active Member of Clubs or Organizations: Not on file  . Attends Archivist Meetings: Not on file  . Marital Status: Not on file  Intimate Partner Violence:   . Fear of Current or Ex-Partner: Not on file  . Emotionally Abused: Not on file  . Physically Abused: Not on file  . Sexually Abused: Not on file    Past Surgical History:  Procedure Laterality Date  . BOWEL RESECTION  12/19/2011   Procedure: SMALL BOWEL RESECTION;  Surgeon: [name], MD;  [facility];  Service: General;  Laterality: N/A;  with anastamosis and insertion mesh  . BRONCHOSCOPY    . COLON SURGERY  01/2004   x 2  . COLONOSCOPY W/ BIOPSIES  04/26/2017   per [name], no polyps, benign inflammation, repeat in 5 yrs   . CYSTOSCOPY    . ESOPHAGOGASTRODUODENOSCOPY    . HEMICOLECTOMY     left side, at [facility], diverticulitis  . HEMORRHOID BANDING    . HERNIA REPAIR     [phone] incisional hernia  . ILEOSTOMY    . ILEOSTOMY CLOSURE    . INSERTION OF MESH  07/31/2012   Procedure: INSERTION OF MESH;  Surgeon: [name], MD;  [facility];  Service: General;;  . LAPAROTOMY  12/19/2011   Procedure: EXPLORATORY LAPAROTOMY;  Surgeon: [name], MD;  [facility];  Service: General;  Laterality: N/A;  . TONSILLECTOMY    . UPPER GASTROINTESTINAL ENDOSCOPY    . VASECTOMY    . VENTRAL HERNIA REPAIR  07/31/2012   Procedure: HERNIA REPAIR VENTRAL ADULT;  Surgeon: [name], MD;  [facility];  Service: General;  Laterality: N/A;        Physical Exam: There were no vitals taken for this visit.    Affect appropriate Healthy:  appears stated age 69: normal Neck supple with no adenopathy JVP normal  no bruits no thyromegaly Lungs clear with no wheezing and good diaphragmatic motion Heart:  S1/S2 no murmur, no rub, gallop or click PMI normal Abdomen: benighn, BS positve, no tenderness, no AAA no bruit.  No HSM or HJR  Distal pulses intact with no bruits No edema Neuro non-focal Skin warm and dry No muscular weakness    Labs:   Lab Results  Component Value Date   WBC 9.9 05/27/2020   HGB 15.0 05/27/2020   HCT 45.3 05/27/2020   MCV 88.8 05/27/2020   PLT 201 05/27/2020   No results for input(s): NA, K, CL, CO2, BUN, CREATININE, CALCIUM, PROT, BILITOT, ALKPHOS, ALT, AST, GLUCOSE in the last 168 hours.  Invalid input(s): LABALBU Lab Results  Component Value Date   CKTOTAL 47 01/08/2018   CKMB 1.8 12/21/2011   TROPONINI <0.30 12/21/2011    Lab Results  Component Value Date   CHOL 142 01/08/2018   CHOL 152 05/16/2012   CHOL  09/05/2008    130        ATP III CLASSIFICATION:  <200     mg/dL   Desirable  200-239  mg/dL   Borderline High  >=240    mg/dL   High          Lab Results  Component Value Date   HDL 26.70 (L) 01/08/2018   HDL 30.90 (L) 05/16/2012   HDL 18 (L) 09/05/2008   Lab Results  Component Value Date   [facility]  09/05/2008    53        Total Cholesterol/HDL:CHD Risk Coronary Heart Disease Risk Table                     Men   Women  1/2 Average Risk   3.4   3.3  Average Risk       5.0   4.4  2 X Average Risk   9.6   7.1  3 X Average Risk  23.4   11.0        Use the calculated Patient Ratio above and the CHD Risk Table to determine the patient's CHD Risk.        ATP III CLASSIFICATION (LDL):  <100     mg/dL   Optimal  100-129  mg/dL   Near or Above                    Optimal  130-159  mg/dL   Borderline  160-189  mg/dL   High  >190     mg/dL   Very High   Lab Results  Component Value Date   TRIG (H) 01/08/2018    460.0 Triglyceride is over 400; calculations on Lipids are invalid.   TRIG 218.0 (H) 05/16/2012   TRIG 295 (H) 09/05/2008    Lab Results  Component Value Date   CHOLHDL 5 01/08/2018   CHOLHDL 5 05/16/2012   CHOLHDL 7.2 09/05/2008   Lab Results  Component Value Date   LDLDIRECT 71.0 01/08/2018   LDLDIRECT 92.0 05/16/2012      Radiology: CT Head Wo Contrast  Result Date: 05/27/2020 CLINICAL DATA:  69 year old male with several days of lightheadedness, altered mental status. EXAM: CT HEAD WITHOUT CONTRAST TECHNIQUE: Contiguous axial images were obtained from the base of the skull through the vertex without intravenous contrast. COMPARISON:  None. FINDINGS: Brain: There are few small nonspecific dystrophic calcifications in the brain, including 2 subependymal foci along the posterior right lateral ventricle (series 5, image 18). No midline shift, ventriculomegaly, mass effect, evidence of mass lesion, intracranial hemorrhage or evidence of cortically based acute infarction. Cerebral volume does appear somewhat decreased over that expected for age. No areas of disproportionate cerebral atrophy identified. The gray-white matter differentiation  is within normal limits for age. No cortical encephalomalacia identified. Vascular: Mild Calcified atherosclerosis at the skull base. Skull: Negative.  Hyperostosis frontalis (normal variant). Sinuses/Orbits: Visualized paranasal sinuses and mastoids are clear. Other: Visualized orbits and scalp soft tissues are within normal limits. IMPRESSION: 1. No acute intracranial abnormality. 2. Nonspecific brain findings of suspected age advanced generalized cerebral volume loss and a few punctate dystrophic calcifications. Electronically Signed   By: Genevie Ann M.D.   On: 05/27/2020 23:59    EKG: See HPI   ASSESSMENT AND PLAN:   1. Abnormal ECG:  Bi-fasicular block  TTE 08/12/19 low normal EF 50-55% no valve disease yearly ECG 2. History of SVT distant stable observe  3. Asthma:  No active wheezing PRN Proventil f/u with primary  4. GI:  chrohnes with previous colectomy f/u Ardis Hughs on  Levsin and Entyvio no recent abdominal pains   Signed: Jenkins Rouge 06/16/2020, 8:59 AM

## 2020-06-18 ENCOUNTER — Ambulatory Visit: Payer: BLUE CROSS/BLUE SHIELD | Admitting: Cardiovascular Disease

## 2020-06-23 DIAGNOSIS — Z79899 Other long term (current) drug therapy: Secondary | ICD-10-CM | POA: Diagnosis not present

## 2020-06-23 DIAGNOSIS — Z111 Encounter for screening for respiratory tuberculosis: Secondary | ICD-10-CM | POA: Diagnosis not present

## 2020-06-23 DIAGNOSIS — K5000 Crohn's disease of small intestine without complications: Secondary | ICD-10-CM | POA: Diagnosis not present

## 2020-06-23 DIAGNOSIS — R5383 Other fatigue: Secondary | ICD-10-CM | POA: Diagnosis not present

## 2020-07-02 ENCOUNTER — Other Ambulatory Visit: Payer: Self-pay

## 2020-07-02 ENCOUNTER — Telehealth (INDEPENDENT_AMBULATORY_CARE_PROVIDER_SITE_OTHER): Payer: BLUE CROSS/BLUE SHIELD | Admitting: Cardiovascular Disease

## 2020-07-02 ENCOUNTER — Ambulatory Visit (INDEPENDENT_AMBULATORY_CARE_PROVIDER_SITE_OTHER): Payer: BLUE CROSS/BLUE SHIELD

## 2020-07-02 VITALS — BP 135/82 | HR 54 | Ht 70.0 in | Wt 218.0 lb

## 2020-07-02 DIAGNOSIS — I4589 Other specified conduction disorders: Secondary | ICD-10-CM

## 2020-07-02 DIAGNOSIS — I495 Sick sinus syndrome: Secondary | ICD-10-CM

## 2020-07-02 DIAGNOSIS — R9431 Abnormal electrocardiogram [ECG] [EKG]: Secondary | ICD-10-CM | POA: Diagnosis not present

## 2020-07-02 NOTE — Patient Instructions (Addendum)
Medication Instructions:  *If you need a refill on your cardiac medications before your next appointment, please call your pharmacy*  Lab Work: If you have labs (blood work) drawn today and your tests are completely normal, you will receive your results only by: Marland Kitchen MyChart Message (if you have MyChart) OR . A paper copy in the mail If you have any lab test that is abnormal or we need to change your treatment, we will call you to review the results.  Testing/Procedures: Your physician has requested that you have an exercise tolerance test. For further information please visit [email or web address]. Please also follow instruction sheet, as given.  Your physician has recommended that you wear a zio monitor for 3 days. Zio monitors are medical devices that record the heart's electrical activity. Doctors most often use these monitors to diagnose arrhythmias. Arrhythmias are problems with the speed or rhythm of the heartbeat. The monitor is a small, portable device. You can wear one while you do your normal daily activities. This is usually used to diagnose what is causing palpitations/syncope (passing out).  ZIO XT- Long Term Monitor Instructions   Your physician has requested you wear your ZIO patch monitor____3___days.   This is a single patch monitor.  [employer] supplies one patch monitor per enrollment.  Additional stickers are not available.   Please do not apply patch if you will be having a Nuclear Stress Test, Echocardiogram, Cardiac CT, MRI, or Chest Xray during the time frame you would be wearing the monitor. The patch cannot be worn during these tests.  You cannot remove and re-apply the ZIO XT patch monitor.   Your ZIO patch monitor will be sent USPS Priority mail from [facility] directly to your home address. The monitor may also be mailed to a PO BOX if home delivery is not available.   It may take 3-5 days to receive your monitor after you have been enrolled.   Once you have  received you monitor, please review enclosed instructions.  Your monitor has already been registered assigning a specific monitor serial # to you.   Applying the monitor   Shave hair from upper left chest.   Hold abrader disc by orange tab.  Rub abrader in 40 strokes over left upper chest as indicated in your monitor instructions.   Clean area with 4 enclosed alcohol pads .  Use all pads to assure are is cleaned thoroughly.  Let dry.   Apply patch as indicated in monitor instructions.  Patch will be place under collarbone on left side of chest with arrow pointing upward.   Rub patch adhesive wings for 2 minutes.Remove white label marked "1".  Remove white label marked "2".  Rub patch adhesive wings for 2 additional minutes.   While looking in a mirror, press and release button in center of patch.  A small green light will flash 3-4 times .  This will be your only indicator the monitor has been turned on.     Do not shower for the first 24 hours.  You may shower after the first 24 hours.   Press button if you feel a symptom. You will hear a small click.  Record Date, Time and Symptom in the Patient Log Book.   When you are ready to remove patch, follow instructions on last 2 pages of Patient Log Book.  Stick patch monitor onto last page of Patient Log Book.   Place Patient Log Book in [place] box.  Use locking tab on box  and tape box closed securely.  The [employer] and [employer] has [employer] on it.  Please place in mailbox as soon as possible.  Your physician should have your test results approximately 7 days after the monitor has been mailed back to [facility].   Call [place] at [phone] if you have questions regarding your ZIO XT patch monitor.  Call them immediately if you see an orange light blinking on your monitor.   If your monitor falls off in less than 4 days contact our Monitor department at [phone].  If your monitor becomes loose or falls off  after 4 days call [employer] at [phone] for suggestions on securing your monitor.   Follow-Up: At [facility], you and your health needs are our priority.  As part of our continuing mission to provide you with exceptional heart care, we have created designated Provider Care Teams.  These Care Teams include your primary Cardiologist (physician) and Advanced Practice Providers (APPs -  Physician Assistants and Nurse Practitioners) who all work together to provide you with the care you need, when you need it.  We recommend signing up for the patient portal called "MyChart".  Sign up information is provided on this After Visit Summary.  MyChart is used to connect with patients for Virtual Visits (Telemedicine).  Patients are able to view lab/test results, encounter notes, upcoming appointments, etc.  Non-urgent messages can be sent to your provider as well.   To learn more about what you can do with MyChart, go to [email or web address].    Your next appointment:   6 month(s)  The format for your next appointment:   In Person  Provider:   You may see Dr. [name] or one of the following Advanced Practice Providers on your designated Care Team:    Truitt Merle, NP  Cecilie Kicks, NP  Kathyrn Drown, NP

## 2020-07-02 NOTE — Progress Notes (Signed)
CARDIOLOGY CONSULT NOTE     Virtual Visit via Video Note   This visit type was conducted due to national recommendations for restrictions regarding the COVID-19 Pandemic (e.g. social distancing) in an effort to limit this patient's exposure and mitigate transmission in our community.  Due to her co-morbid illnesses, this patient is at least at moderate risk for complications without adequate follow up.  This format is felt to be most appropriate for this patient at this time.  All issues noted in this document were discussed and addressed.  A limited physical exam was performed with this format.  Please refer to the patient's chart for her consent to telehealth for [facility].   Primary Physician: [name], MD Primary Cardiologist: [name]   HPI:  69 y.o. 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 Acute prostatitis 07/24/2007   Qualifier: Diagnosis of  By: [name] MD, [name]   . Allergy    mild  . Arthritis    osteoarthritis  . Asthma   . Blood  transfusion without reported diagnosis   . BPH (benign prostatic hypertrophy) with urinary obstruction   . Crohn's ileitis ([place]) suspected 05/03/2017  . Dilated aortic root ([place])    noted on echo 08/2012  . Diverticulitis of colon   . EPIDIDYMITIS 02/15/2010   Qualifier: Diagnosis of  By: [name] MD, [name]   . GERD (gastroesophageal reflux disease)   . H/O: GI bleed   . Hemorrhoids   . Hepatitis 1975   unknown type   . HERPES SIMPLEX INFECTION 10/14/2007   Qualifier: Diagnosis of  By: [name] MD, [name]   . Hiatal hernia   . Ileus following gastrointestinal surgery ([place]) 12/26/2011  . Long term (current) use of systemic steroids 06/18/2018  . Psoriasis    sees [name] at [facility].  . Recurrent ventral incisional hernia 05/10/2012  . SVT (supraventricular tachycardia) ([place])   . Ulcer 08/21/2013   ileal    Family History  Problem Relation Age of Onset  . Lung cancer Mother   . Leukemia Father   . Hypertension Father   . Heart disease Father   . Heart attack Father   . Prostate cancer Father   . Prostate cancer Paternal Uncle   . Colon cancer Neg Hx   . Stomach cancer Neg Hx   . Colon polyps Neg Hx   . Esophageal cancer Neg Hx   .  Rectal cancer Neg Hx     Social History   Socioeconomic History  . Marital status: Married    Spouse name: Not on file  . Number of children: 2  . Years of education: Not on file  . Highest education level: Not on file  Occupational History  . 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 Smoking status: Never Smoker  . Smokeless tobacco: Never Used  Vaping Use  . Vaping Use: Never used  Substance and Sexual Activity  . Alcohol use: Yes    Alcohol/week: 0.0 standard drinks    Comment: occ  . Drug use: No  . Sexual activity: Not on file  Other Topics Concern  . 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 BOWEL RESECTION  12/19/2011   Procedure: SMALL BOWEL RESECTION;  Surgeon: [name], MD;  Location: [facility];  Service: General;  Laterality: N/A;  with anastamosis and insertion mesh  . BRONCHOSCOPY    . COLON SURGERY  01/2004   x 2  . COLONOSCOPY W/ BIOPSIES  04/26/2017   per [name], no polyps, benign inflammation, repeat in 5 yrs   . CYSTOSCOPY    . ESOPHAGOGASTRODUODENOSCOPY    . HEMICOLECTOMY     left side, at [facility], diverticulitis  . HEMORRHOID BANDING    . HERNIA REPAIR     [phone] incisional hernia  . ILEOSTOMY    . ILEOSTOMY CLOSURE    . INSERTION OF MESH  07/31/2012   Procedure: INSERTION OF MESH;  Surgeon: [name], MD;  Location: [facility];  Service: General;;  . LAPAROTOMY  12/19/2011   Procedure: EXPLORATORY LAPAROTOMY;  Surgeon: [name], MD;  Location: [facility];  Service: General;  Laterality: N/A;  . TONSILLECTOMY    . UPPER GASTROINTESTINAL ENDOSCOPY    . VASECTOMY    . VENTRAL HERNIA REPAIR  07/31/2012   Procedure: HERNIA REPAIR VENTRAL ADULT;  Surgeon: [name], MD;  Location: [facility];  Service: General;  Laterality: N/A;        Physical Exam: Blood pressure 135/82, pulse (!) 54, height 5' 10"  (1.778 m), weight 98.9 kg.    Overweight white male Glasses No distress No tachypnea     Labs:   Lab Results  Component Value Date   WBC 9.9 05/27/2020   HGB 15.0 05/27/2020   HCT 45.3 05/27/2020    MCV 88.8 05/27/2020   PLT 201 05/27/2020   No results for input(s): NA, K, CL, CO2, BUN, CREATININE, CALCIUM, PROT, BILITOT, ALKPHOS, ALT, AST, GLUCOSE in the last 168 hours.  Invalid input(s): LABALBU Lab Results  Component Value Date   CKTOTAL 47 01/08/2018   CKMB 1.8 12/21/2011   TROPONINI <0.30 12/21/2011  Lab Results  Component Value Date   CHOL 142 01/08/2018   CHOL 152 05/16/2012   CHOL  09/05/2008    130        ATP III CLASSIFICATION:  <200     mg/dL   Desirable  200-239  mg/dL   Borderline High  >=240    mg/dL   High          Lab Results  Component Value Date   HDL 26.70 (L) 01/08/2018   HDL 30.90 (L) 05/16/2012   HDL 18 (L) 09/05/2008   Lab Results  Component Value Date   [facility]  09/05/2008    53        Total Cholesterol/HDL:CHD Risk Coronary Heart Disease Risk Table                     Men   Women  1/2 Average Risk   3.4   3.3  Average Risk       5.0   4.4  2 X Average Risk   9.6   7.1  3 X Average Risk  23.4   11.0        Use the calculated Patient Ratio above and the CHD Risk Table to determine the patient's CHD Risk.        ATP III CLASSIFICATION (LDL):  <100     mg/dL   Optimal  100-129  mg/dL   Near or Above                    Optimal  130-159  mg/dL   Borderline  160-189  mg/dL   High  >190     mg/dL   Very High   Lab Results  Component Value Date   TRIG (H) 01/08/2018    460.0 Triglyceride is over 400; calculations on Lipids are invalid.   TRIG 218.0 (H) 05/16/2012   TRIG 295 (H) 09/05/2008   Lab Results  Component Value Date   CHOLHDL 5 01/08/2018   CHOLHDL 5 05/16/2012   CHOLHDL 7.2 09/05/2008   Lab Results  Component Value Date   LDLDIRECT 71.0 01/08/2018   LDLDIRECT 92.0 05/16/2012      Radiology: No results found.  EKG: See HPI   ASSESSMENT AND PLAN:   1. Abnormal ECG:  Bi-fasicular block  TTE 08/12/19 low normal EF 50-55% no valve disease yearly ECG Since he has had more fatigue and feels average HR lower will  order ETT to r/o chronotropic incompetence and 48 hr holder monitor  2. History of SVT distant stable observe  3. Asthma:  Not active issue  4. GI:  chrohnes with previous colectomy f/u Gessner on Stelara no recent abdominal pains   ETT 48 hr monitor F/u with me in 6 months   Signed: Jenkins Rouge 07/02/2020, 9:00 AM

## 2020-07-12 DIAGNOSIS — R9431 Abnormal electrocardiogram [ECG] [EKG]: Secondary | ICD-10-CM

## 2020-07-12 DIAGNOSIS — I4589 Other specified conduction disorders: Secondary | ICD-10-CM

## 2020-07-12 DIAGNOSIS — I495 Sick sinus syndrome: Secondary | ICD-10-CM

## 2020-08-16 ENCOUNTER — Ambulatory Visit: Payer: BLUE CROSS/BLUE SHIELD | Admitting: Family Medicine

## 2020-08-16 ENCOUNTER — Other Ambulatory Visit: Payer: Self-pay

## 2020-08-16 ENCOUNTER — Encounter: Payer: Self-pay | Admitting: Family Medicine

## 2020-08-16 VITALS — BP 126/84 | HR 58 | Temp 97.5°F | Ht 70.0 in | Wt 222.0 lb

## 2020-08-16 DIAGNOSIS — H60502 Unspecified acute noninfective otitis externa, left ear: Secondary | ICD-10-CM | POA: Diagnosis not present

## 2020-08-16 MED ORDER — KETOCONAZOLE 2 % EX CREA
TOPICAL_CREAM | CUTANEOUS | 5 refills | Status: DC
Start: 2020-08-16 — End: 2022-01-18

## 2020-08-16 MED ORDER — CIPROFLOXACIN-DEXAMETHASONE 0.3-0.1 % OT SUSP: 4.0000 [drp] | Freq: Two times a day (BID) | OTIC | 0 refills | Status: DC

## 2020-08-16 NOTE — Progress Notes (Signed)
   Subjective:    Patient ID: SANAY BELMAR, male    DOB: 1951-03-08, 70 y.o.   MRN: [id number]  HPI Here for 2 days of an itch and a pain in the left ear. No sinus congestion or PND or ST or fever.    Review of Systems  Constitutional: Negative.   HENT: Positive for ear pain. Negative for congestion, ear discharge, hearing loss, postnasal drip and sinus pressure.   Eyes: Negative.   Respiratory: Negative.        Objective:   Physical Exam Constitutional:      Appearance: Normal appearance.  HENT:     Right Ear: Tympanic membrane, ear canal and external ear normal.     Left Ear: Tympanic membrane normal.     Ears:     Comments: Left canal is pink and tender     Mouth/Throat:     Pharynx: Oropharynx is clear.  Eyes:     Conjunctiva/sclera: Conjunctivae normal.  Cardiovascular:     Rate and Rhythm: Normal rate and regular rhythm.     Pulses: Normal pulses.     Heart sounds: Normal heart sounds.  Pulmonary:     Effort: Pulmonary effort is normal.     Breath sounds: Normal breath sounds.  Lymphadenopathy:     Cervical: No cervical adenopathy.  Neurological:     Mental Status: He is alert.           Assessment & Plan:  Otitis externa, treat with Ciprodex drops. Gershon Crane, MD

## 2020-08-18 ENCOUNTER — Encounter: Payer: Self-pay | Admitting: Internal Medicine

## 2020-08-18 DIAGNOSIS — K5000 Crohn's disease of small intestine without complications: Secondary | ICD-10-CM | POA: Diagnosis not present

## 2020-08-20 ENCOUNTER — Other Ambulatory Visit (HOSPITAL_COMMUNITY)
Admission: RE | Admit: 2020-08-20 | Discharge: 2020-08-20 | Disposition: A | Discharge status: Home / Self Care | Payer: BLUE CROSS/BLUE SHIELD | Source: Ambulatory Visit | Attending: Cardiovascular Disease | Admitting: Cardiovascular Disease

## 2020-08-20 DIAGNOSIS — Z01812 Encounter for preprocedural laboratory examination: Secondary | ICD-10-CM | POA: Diagnosis not present

## 2020-08-20 DIAGNOSIS — Z20822 Contact with and (suspected) exposure to covid-19: Secondary | ICD-10-CM | POA: Insufficient documentation

## 2020-08-21 LAB — SARS CORONAVIRUS 2 (TAT 6-24 HRS): SARS Coronavirus 2: NEGATIVE

## 2020-08-24 ENCOUNTER — Ambulatory Visit (INDEPENDENT_AMBULATORY_CARE_PROVIDER_SITE_OTHER): Payer: BLUE CROSS/BLUE SHIELD

## 2020-08-24 ENCOUNTER — Other Ambulatory Visit: Payer: Self-pay

## 2020-08-24 DIAGNOSIS — I495 Sick sinus syndrome: Secondary | ICD-10-CM

## 2020-08-24 DIAGNOSIS — I4589 Other specified conduction disorders: Secondary | ICD-10-CM | POA: Diagnosis not present

## 2020-08-24 DIAGNOSIS — R9431 Abnormal electrocardiogram [ECG] [EKG]: Secondary | ICD-10-CM

## 2020-08-24 LAB — EXERCISE TOLERANCE TEST
Estimated workload: 7 METS
Exercise duration (min): 5 min
Exercise duration (sec): 58 s
MPHR: 153 {beats}/min
Peak HR: 104 {beats}/min
Percent HR: 67 %
RPE: 17
Rest HR: 53 {beats}/min

## 2020-08-25 ENCOUNTER — Telehealth: Payer: Self-pay

## 2020-08-25 DIAGNOSIS — Z79899 Other long term (current) drug therapy: Secondary | ICD-10-CM

## 2020-08-25 DIAGNOSIS — R5383 Other fatigue: Secondary | ICD-10-CM

## 2020-08-25 DIAGNOSIS — I4589 Other specified conduction disorders: Secondary | ICD-10-CM

## 2020-08-25 MED ORDER — LOSARTAN POTASSIUM 25 MG PO TABS: 25.0000 mg | ORAL_TABLET | Freq: Every day | ORAL | 3 refills | Status: DC

## 2020-08-25 NOTE — Telephone Encounter (Signed)
Patient aware of results. Order placed for losartan 25 mg by mouth daily, BMET, and referrral for EP. Patient will come in on 09/15/20 for lab work. Informed patient someone will call to schedule consult with Dr. Elberta Fortis. Patient verbalized understanding.

## 2020-08-25 NOTE — Telephone Encounter (Signed)
-----   Message from Josue Hector, MD sent at 08/24/2020  1:42 PM EST ----- HTN response to exercise start low dose cozaar 25 mg daily BMET in 3 weeks HR only reached max of 104 bpm Baseline ECG with bifasicular block Refer to EP for consideration of pacer for chronotropic incompetence and fatigue

## 2020-09-07 ENCOUNTER — Ambulatory Visit: Payer: BLUE CROSS/BLUE SHIELD | Admitting: Cardiology

## 2020-09-07 ENCOUNTER — Encounter: Payer: Self-pay | Admitting: Cardiology

## 2020-09-07 ENCOUNTER — Other Ambulatory Visit: Payer: Self-pay

## 2020-09-07 VITALS — BP 118/68 | HR 59 | Ht 70.0 in | Wt 218.0 lb

## 2020-09-07 DIAGNOSIS — I4589 Other specified conduction disorders: Secondary | ICD-10-CM | POA: Diagnosis not present

## 2020-09-07 NOTE — Progress Notes (Signed)
[name]   Date:  09/07/2020   ID:  [name], DOB 19-Jan-1951, MRN [id number]  PCP:  [name], MD  Cardiologist:  [name] Primary Electrophysiologist:  Kimorah Ridolfi Meredith Leeds, MD    Chief Complaint: fatigue   History of Present Illness: [name] is a 70 y.o. 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 Presenting today for Justin evaluation.  He has a history significant for Crohn's disease, asthma, dilated aortic root, and a distant history of SVT.  He also has a bifascicular block.  He has been having episodes of fatigue.  He has also noted that his heart rate is at times in the 40s.  He has not had syncope.  He wore a cardiac monitor which showed an average heart rate of 58 bpm, but a maximal heart rate of only 103 bpm.  He did have a stress test and was able to get his heart rate up to just above 100 bpm.  Today, he denies symptoms of palpitations, chest pain, shortness of breath, orthopnea, PND, lower extremity edema, claudication, dizziness, presyncope, syncope, bleeding, or neurologic sequela. The patient is tolerating medications without difficulties.  Since starting his blood pressure medications, his level of fatigue has improved.  Despite that, fatigue has certainly continued.  His fatigue is worse when he is at rest and improves somewhat when he is exerting himself.  Despite that, he certainly has continued fatigue.   Past Medical History:  Diagnosis Date  . Acute prostatitis 07/24/2007   Qualifier: Diagnosis of  By: [name] MD, [name]   . Allergy    mild  . Arthritis    osteoarthritis  . Asthma   . Blood transfusion without reported diagnosis   . BPH (benign prostatic hypertrophy) with urinary obstruction   . Crohn's ileitis ([place]) suspected 05/03/2017  . Dilated aortic root ([place])    noted on echo 08/2012  . Diverticulitis of colon   . EPIDIDYMITIS 02/15/2010    Qualifier: Diagnosis of  By: [name] MD, [name]   . GERD (gastroesophageal reflux disease)   . H/O: GI bleed   . Hemorrhoids   . Hepatitis 1975   unknown type   . HERPES SIMPLEX INFECTION 10/14/2007   Qualifier: Diagnosis of  By: [name] MD, [name]   . Hiatal hernia   . Ileus following gastrointestinal surgery ([place]) 12/26/2011  . Long term (current) use of systemic steroids 06/18/2018  . Psoriasis    sees [name] at [facility].  . Recurrent ventral incisional hernia 05/10/2012  . SVT (supraventricular tachycardia) ([place])   . Ulcer 08/21/2013   ileal   Past Surgical History:  Procedure Laterality Date  . BOWEL RESECTION  12/19/2011   Procedure: SMALL BOWEL RESECTION;  Surgeon: [name], MD;  Location: [facility];  Service: General;  Laterality: N/A;  with anastamosis and insertion mesh  . BRONCHOSCOPY    . COLON SURGERY  01/2004   x 2  . COLONOSCOPY W/ BIOPSIES  04/26/2017   per [name], no polyps, benign inflammation, repeat in 5 yrs   . CYSTOSCOPY    . ESOPHAGOGASTRODUODENOSCOPY    . HEMICOLECTOMY     left side, at [facility], diverticulitis  . HEMORRHOID BANDING    . HERNIA REPAIR     [phone] incisional hernia  . ILEOSTOMY    . ILEOSTOMY CLOSURE    . INSERTION OF MESH  07/31/2012   Procedure: INSERTION OF MESH;  Surgeon: [name], MD;  Location: [facility];  Service: General;;  . LAPAROTOMY  12/19/2011   Procedure: EXPLORATORY LAPAROTOMY;  Surgeon: [name], MD;  Location: [facility];  Service: General;  Laterality: N/A;  . TONSILLECTOMY    . UPPER GASTROINTESTINAL ENDOSCOPY    . VASECTOMY    . VENTRAL HERNIA REPAIR  07/31/2012   Procedure: HERNIA REPAIR VENTRAL ADULT;  Surgeon: [name], MD;  Location: [facility];  Service: General;  Laterality: N/A;     Current Outpatient Medications  Medication Sig Dispense Refill  . albuterol (PROVENTIL HFA;VENTOLIN HFA) 108 (90 Base) MCG/ACT inhaler INHALE 2 PUFFS INTO THE LUNGS EVERY 4 (FOUR)  HOURS AS NEEDED FOR WHEEZING OR SHORTNESS OF BREATH. 8.5 Inhaler 2  . diphenoxylate-atropine (LOMOTIL) 2.5-0.025 MG tablet 1-2 tablets 4 times a day as neded 90 tablet 1  . EPINEPHrine (EPIPEN 2-PAK) 0.3 mg/0.3 mL IJ SOAJ injection Inject 0.3 mLs (0.3 mg total) into the muscle as needed for anaphylaxis. 1 Device 0  . esomeprazole (NEXIUM) 40 MG capsule Take 1 capsule (40 mg total) by mouth daily before breakfast. 30 capsule 2  . halobetasol (ULTRAVATE) 0.05 % ointment Apply topically 2 (two) times daily. 50 g 5  . hyoscyamine (LEVSIN SL) 0.125 MG SL tablet PLACE 1 TABLET UNDER THE TONGUE EVERY 4 HOURS AS NEEDED FOR CRAMPING (URGENT DEFECATION) 60 tablet 2  . ketoconazole (NIZORAL) 2 % cream APPLY TWICE A DAY AS NEEDED FOR FUNGAL INFECTION 30 g 5  . losartan (COZAAR) 25 MG tablet Take 1 tablet (25 mg total) by mouth daily. 90 tablet 3  . Multiple Vitamins-Minerals (IMMUNE SUPPORT VITAMIN C PO) Take 1 tablet by mouth daily.    . pramoxine-hydrocortisone (ANALPRAM-HC) 1-1 % rectal cream Place 1 application rectally 2 (two) times daily. Use twice daily before SITZ baths 30 g 0  . tadalafil (CIALIS) 5 MG tablet Take 1 tablet (5 mg total) by mouth daily. 90 tablet 3  . traZODone (DESYREL) 100 MG tablet TAKE ONE HALF TABLETS (50 MG TOTAL) BY MOUTH AT BEDTIME. 45 tablet 11  . vedolizumab (ENTYVIO) 300 MG injection INFUSE 300 MG IV AT WEEK 6, THEN EVERY 8 WEEKS THEREAFTER. 1 each 6   No current facility-administered medications for this visit.    Allergies:   Mercaptopurine, Shellfish allergy, and Humira [adalimumab]   Social History:  The patient  reports that he has never smoked. He has never used smokeless tobacco. He reports current alcohol use. He reports that he does not use drugs.   Family History:  The patient's family history includes Heart attack in his father; Heart disease in his father; Hypertension in his father; Leukemia in his father; Lung cancer in his mother; Prostate cancer in his  father and paternal uncle.    ROS:  Please see the history of present illness.   Otherwise, review of systems is positive for none.   All other systems are reviewed and negative.    PHYSICAL EXAM: VS:  BP 118/68   Pulse (!) 59   Ht 5' 10"  (1.778 m)   Wt 218 lb (98.9 kg)   SpO2 97%   BMI 31.28 kg/m  , BMI Body mass index is 31.28 kg/m. GEN: Well nourished, well developed, in no acute distress  HEENT: normal  Neck: no JVD, carotid bruits, or masses Cardiac: RRR; no murmurs, rubs, or gallops,no edema  Respiratory:  clear to auscultation bilaterally, normal work of  breathing GI: soft, nontender, nondistended, + BS MS: no deformity or atrophy  Skin: warm and dry Neuro:  Strength and sensation are intact Psych: euthymic mood, full affect  EKG:  EKG is ordered today. Personal review of the ekg ordered shows sinus rhythm, rate 59, left anterior fascicular block  Recent Labs: 05/27/2020: ALT 30; BUN 16; Creatinine, Ser 0.97; Hemoglobin 15.0; Platelets 201; Potassium 3.9; Sodium 138    Lipid Panel     Component Value Date/Time   CHOL 142 01/08/2018 1539   TRIG (H) 01/08/2018 1539    460.0 Triglyceride is over 400; calculations on Lipids are invalid.   HDL 26.70 (L) 01/08/2018 1539   CHOLHDL 5 01/08/2018 1539   VLDL 43.6 (H) 05/16/2012 1042   LDLCALC  09/05/2008 0315    53        Total Cholesterol/HDL:CHD Risk Coronary Heart Disease Risk Table                     Men   Women  1/2 Average Risk   3.4   3.3  Average Risk       5.0   4.4  2 X Average Risk   9.6   7.1  3 X Average Risk  23.4   11.0        Use the calculated Patient Ratio above and the CHD Risk Table to determine the patient's CHD Risk.        ATP III CLASSIFICATION (LDL):  <100     mg/dL   Optimal  100-129  mg/dL   Near or Above                    Optimal  130-159  mg/dL   Borderline  160-189  mg/dL   High  >190     mg/dL   Very High   LDLDIRECT 71.0 01/08/2018 1539     Wt Readings from Last 3  Encounters:  09/07/20 218 lb (98.9 kg)  08/16/20 222 lb (100.7 kg)  07/02/20 218 lb (98.9 kg)      Other studies Reviewed: Additional studies/ records that were reviewed today include: TTE 08/12/19 she has been very was Review of the above records today demonstrates:  1. Left ventricular ejection fraction, by visual estimation, is 50 to  55%. The left ventricle has low normal function. There is no left  ventricular hypertrophy.  2. The left ventricle demonstrates regional wall motion abnormalities.  3. Distal septal/ apical hypokinesis Normal GLS -18.8.  4. Global right ventricle has normal systolic function.The right  ventricular size is normal. No increase in right ventricular wall  thickness.  5. Left atrial size was normal.  6. Right atrial size was normal.  7. The mitral valve is normal in structure. Trivial mitral valve  regurgitation. No evidence of mitral stenosis.  8. The tricuspid valve is normal in structure.  9. The tricuspid valve is normal in structure. Tricuspid valve  regurgitation is not demonstrated.  10. The aortic valve is normal in structure. Aortic valve regurgitation is  not visualized. No evidence of aortic valve sclerosis or stenosis.  11. The pulmonic valve was normal in structure. Pulmonic valve  regurgitation is not visualized.  12. Normal pulmonary artery systolic pressure.  13. The inferior vena cava is normal in size with greater than 50%  respiratory variability, suggesting right atrial pressure of 3 mmHg.   Cardiac monitor 07/28/2020 personally reviewed Patient had a min HR of 40 bpm, max HR  of 103 bpm, and avg HR of 58 bpm. Predominant underlying rhythm was Sinus Rhythm. 1 run of Supraventricular Tachycardia occurred lasting 4 beats with a max rate of 99 bpm (avg 91 bpm). Isolated SVEs were rare  (<1.0%), SVE Couplets were rare (<1.0%), and no SVE Triplets were present. Isolated VEs were rare (<1.0%), and no VE Couplets or VE Triplets were  present. No significant arrhythmias  ETT 08/24/2020 personally reviewed  Blood pressure demonstrated a hypertensive response to exercise.  Target heart rate was not achieved.  There was no ST segment deviation noted during stress.  No ischemic changes were noted at sub-target heart rate.  Consider functional stress testing or coronary CT-A if clinically indicated.   ASSESSMENT AND PLAN:  1.  Chronotropic incompetence: Fatigue has improved on his blood pressure medications, though he still has episodes.  He certainly has evidence of chronotropic incompetence by his treadmill test.  He would benefit from pacemaker implant.  He is currently about to retire and Forney Kleinpeter be changing his insurance.  I Sayana Salley see him back in a few months for further discussions of pacemaker implant.  He does say that he is 90% sure that he would proceed.  2.  History of SVT: No further episodes.  Case discussed with primary cardiology  Current medicines are reviewed at length with the patient today.   The patient does not have concerns regarding his medicines.  The following changes were made today:  none  Labs/ tests ordered today include:  Orders Placed This Encounter  Procedures  . EKG 12-Lead     Disposition:   FU with Jianna Drabik 3 months  Signed, Jill Ruppe Meredith Leeds, MD  09/07/2020 9:28 AM     [address] [place] [address] [phone] (office) [phone] (fax)

## 2020-09-07 NOTE — Patient Instructions (Signed)
Medication Instructions:  Your physician recommends that you continue on your current medications as directed. Please refer to the Current Medication list given to you today.  *If you need a refill on your cardiac medications before your next appointment, please call your pharmacy*   Lab Work: None ordered If you have labs (blood work) drawn today and your tests are completely normal, you will receive your results only by: Marland Kitchen MyChart Message (if you have MyChart) OR . A paper copy in the mail If you have any lab test that is abnormal or we need to change your treatment, we will call you to review the results.   Testing/Procedures: Your physician has recommended that you have a pacemaker inserted. A pacemaker is a small device that is placed under the skin of your chest or abdomen to help control abnormal heart rhythms. This device uses electrical pulses to prompt the heart to beat at a normal rate. Pacemakers are used to treat heart rhythms that are too slow. Wire (leads) are attached to the pacemaker that goes into the chambers of you heart. This is done in the hospital and usually requires and overnight stay. Please see the instructions below located under "other instructions".   Please call the office once you get your insurance straightened out and we can schedule procedure.  Follow-Up: At [facility], you and your health needs are our priority.  As part of our continuing mission to provide you with exceptional heart care, we have created designated Provider Care Teams.  These Care Teams include your primary Cardiologist (physician) and Advanced Practice Providers (APPs -  Physician Assistants and Nurse Practitioners) who all work together to provide you with the care you need, when you need it.  Your next appointment:   May/June  The format for your next appointment:   In Person  Provider:   Dr. Curt Bears    Thank you for choosing [facility]!!   Trinidad Curet, RN [phone]   Other Instructions   Implantable Device Instructions  You are scheduled for: Permanent Pacemaker Implant on ________ with Dr. Curt Bears.  1.   Pre procedure testing-             A.  LAB WORK--- On __________  for your pre procedure blood work.  You do NOT need to be fasting.              B. COVID TEST-- On __________ @ ___________ - This is a Drive Up Visit at [address] [place],  [address].  Someone will direct you to the appropriate testing line. Stay in your car and someone will be with you shortly.   After you are tested please go home and self quarantine until the day of your procedure.    2. On the day of your procedure ___________ you will go to [facility] hospital [phone]) at _____________.  You will go to the main entrance [address] [employer]) and enter where the [employer] parking staff are.  You will check in at ADMITTING.  You may have one support person come in to the hospital with you.  They will be asked to wait in the waiting room.   3.   Do not eat or drink after midnight prior to your procedure.   4.   On the morning of your procedure do NOT take any medication.  5.  The night before your procedure and the morning of your procedure scrub your neck/chest with surgical  scrub.  See instruction letter below.   5.  Plan for an overnight stay, but you may be discharged home after your procedure. If you use your phone frequently bring your phone charger, in case you have to stay.  If you are discharged after your procedure you will need someone to drive you home and be with your for 24 hours after your procedure.   6.  You will follow up with the [name] 10-14 days after your procedure. You will follow up with Dr. Curt Bears 91 days after your procedure.  These appointments will be made for you.   * If you have ANY questions after you get home, please call the office [phone] and ask for Nikoleta Dady RN or send a MyChart message.    [facility] - Preparing For Surgery (surgical scrub)   Before surgery, you can play an important role. Because skin is not sterile, your skin needs to be as free of germs as possible. You can reduce the number of germs on your skin by washing with CHG (chlorahexidine gluconate) Soap before surgery.  CHG is an antiseptic cleaner which kills germs and bonds with the skin to continue killing germs even after washing.   Please do not use if you have an allergy to CHG or antibacterial soaps.  If your skin becomes reddened/irritated stop using the CHG.   Do not shave (including legs and underarms) for at least 48 hours prior to first CHG shower.  It is OK to shave your face.  Please follow these instructions carefully:  1.  Shower the night before surgery and the morning of surgery with CHG.  2.  If you choose to wash your hair, wash your hair first as usual with your normal shampoo.  3.  After you shampoo, rinse your hair and body thoroughly to remove the shampoo.  4.  Use CHG as you would any other liquid soap.  You can apply CHG directly to the skin and wash gently with a clean washcloth. 5.  Apply the CHG Soap to your body ONLY FROM THE NECK DOWN.  Do not use on open wounds or open sores.  Avoid contact with your eyes, ears, mouth and genitals (private parts).  Wash genitals (private parts) with your normal soap.  6.  Wash thoroughly, paying special attention to the area where your surgery will be performed.  7.  Thoroughly rinse your body with warm water from the neck down.   8.  DO NOT shower/wash with your normal soap after using and rinsing off the CHG soap.  9.  Pat yourself dry with a clean towel.           10.  Wear clean pajamas.           11.  Place clean sheets on your bed the night of your first shower and do not sleep with pets.  Day of Surgery: Do not apply any deodorants/lotions.  Please wear clean clothes to the hospital/surgery center.       Supplemental Discharge Instructions  for  Pacemaker/Defibrillator Patients  Activity No heavy lifting or vigorous activity with your left/right arm for 6 to 8 weeks.  Do not raise your left/right arm above your head for one week.  Gradually raise your affected arm as drawn below.           __  NO DRIVING for     ; you may begin driving on     .  WOUND CARE - Keep the wound area clean and dry.  Do not get this area wet for one week. No showers for one week; you may shower on     . - The tape/steri-strips on your wound will fall off; do not pull them off.  No bandage is needed on the site.  DO  NOT apply any creams, oils, or ointments to the wound area. - If you notice any drainage or discharge from the wound, any swelling or bruising at the site, or you develop a fever > 101? F after you are discharged home, call the office at once.  Special Instructions - You are still able to use cellular telephones; use the ear opposite the side where you have your pacemaker/defibrillator.  Avoid carrying your cellular phone near your device. - When traveling through airports, show security personnel your identification card to avoid being screened in the metal detectors.  Ask the security personnel to use the hand wand. - Avoid arc welding equipment, MRI testing (magnetic resonance imaging), TENS units (transcutaneous nerve stimulators).  Call the office for questions about other devices. - Avoid electrical appliances that are in poor condition or are not properly grounded. - Microwave ovens are safe to be near or to operate.     Pacemaker Implantation, Adult Pacemaker implantation is a procedure to place a pacemaker inside the chest. A pacemaker is a small computer that sends electrical signals to the heart and helps the heart beat normally. A pacemaker also stores information about heart rhythms. You may need pacemaker implantation if you have:  A slow heartbeat (bradycardia).  Loss of consciousness that happens repeatedly (syncope)  or repeated episodes of dizziness or light-headedness because of an irregular heart rate.  Shortness of breath (dyspnea) due to heart problems. The pacemaker usually attaches to your heart through a wire called a lead. One or two leads may be needed. There are different types of pacemakers:  Transvenous pacemaker. This type is placed under the skin or muscle of your upper chest area. The lead goes through a vein in the chest area to reach the inside of the heart.  Epicardial pacemaker. This type is placed under the skin or muscle of your chest or abdomen. The lead goes through your chest to the outside of the heart. Tell a health care provider about:  Any allergies you have.  All medicines you are taking, including vitamins, herbs, eye drops, creams, and over-the-counter medicines.  Any problems you or family members have had with anesthetic medicines.  Any blood or bone disorders you have.  Any surgeries you have had.  Any medical conditions you have.  Whether you are pregnant or may be pregnant. What are the risks? Generally, this is a safe procedure. However, problems may occur, including:  Infection.  Bleeding.  Failure of the pacemaker or the lead.  Collapse of a lung or bleeding into a lung.  Blood clot inside a blood vessel with a lead.  Damage to the heart.  Infection inside the heart (endocarditis).  Allergic reactions to medicines. What happens before the procedure? Staying hydrated Follow instructions from your health care provider about hydration, which may include:  Up to 2 hours before the procedure - you may continue to drink clear liquids, such as water, clear fruit juice, black coffee, and plain tea.   Eating and drinking restrictions Follow instructions from your health care provider about eating and drinking, which may include:  8 hours before the procedure - stop eating  heavy meals or foods, such as meat, fried foods, or fatty foods.  6 hours  before the procedure - stop eating light meals or foods, such as toast or cereal.  6 hours before the procedure - stop drinking milk or drinks that contain milk.  2 hours before the procedure - stop drinking clear liquids. Medicines Ask your health care provider about:  Changing or stopping your regular medicines. This is especially important if you are taking diabetes medicines or blood thinners.  Taking medicines such as aspirin and ibuprofen. These medicines can thin your blood. Do not take these medicines unless your health care provider tells you to take them.  Taking over-the-counter medicines, vitamins, herbs, and supplements. Tests You may have:  A heart evaluation. This may include: ? An electrocardiogram (ECG). This involves placing patches on your skin to check your heart rhythm. ? A chest X-ray. ? An echocardiogram. This is a test that uses sound waves (ultrasound) to produce an image of the heart. ? A cardiac rhythm monitor. This is used to record your heart rhythm and any events for a longer period of time.  Blood tests.  Genetic testing. General instructions  Do not use any products that contain nicotine or tobacco for at least 4 weeks before the procedure. These products include cigarettes, e-cigarettes, and chewing tobacco. If you need help quitting, ask your health care provider.  Ask your health care provider: ? How your surgery site will be marked. ? What steps will be taken to help prevent infection. These steps may include:  Removing hair at the surgery site.  Washing skin with a germ-killing soap.  Receiving antibiotic medicine.  Plan to have someone take you home from the hospital or clinic.  If you will be going home right after the procedure, plan to have someone with you for 24 hours. What happens during the procedure?  An IV will be inserted into one of your veins.  You will be given one or more of the following: ? A medicine to help you  relax (sedative). ? A medicine to numb the area (local anesthetic). ? A medicine to make you fall asleep (general anesthetic).  The next steps vary depending on the type of pacemaker you will be getting. ? If you are getting a transvenous pacemaker:  An incision will be made in your upper chest.  A pocket will be made for the pacemaker. It may be placed under the skin or between layers of muscle.  The lead will be inserted into a blood vessel that goes to the heart.  While X-rays are taken by an imaging machine (fluoroscopy), the lead will be advanced through the vein to the inside of your heart.  The other end of the lead will be tunneled under the skin and attached to the pacemaker. ? If you are getting an epicardial pacemaker:  An incision will be made near your ribs or breastbone (sternum) for the lead.  The lead will be attached to the outside of your heart.  Another incision will be made in your chest or upper abdomen to create a pocket for the pacemaker.  The free end of the lead will be tunneled under the skin and attached to the pacemaker.  The transvenous or epicardial pacemaker will be tested. Imaging studies may be done to check the lead position.  The incisions will be closed with stitches (sutures), adhesive strips, or skin glue.  Bandages (dressings) will be placed over the incisions. The procedure may  vary among health care providers and hospitals. What happens after the procedure?  Your blood pressure, heart rate, breathing rate, and blood oxygen level will be monitored until you leave the hospital or clinic.  You may be given antibiotics.  You will be given pain medicine.  An ECG and chest X-rays will be done.  You may need to wear a continuous type of ECG (Holter monitor) to check your heart rhythm.  Your health care provider will program the pacemaker.  If you were given a sedative during the procedure, it can affect you for several hours. Do not  drive or operate machinery until your health care provider says that it is safe.  You will be given a pacemaker identification card. This card lists the implant date, device model, and manufacturer of your pacemaker. Summary  A pacemaker is a small computer that sends electrical signals to the heart and helps the heart beat normally.  There are different types of pacemakers. A pacemaker may be placed under the skin or muscle of your chest or abdomen.  Follow instructions from your health care provider about eating and drinking and about taking medicines before the procedure. This information is not intended to replace advice given to you by your health care provider. Make sure you discuss any questions you have with your health care provider. Document Revised: 06/04/2019 Document Reviewed: 06/04/2019 Elsevier Patient Education  2021 [employer].

## 2020-09-15 ENCOUNTER — Other Ambulatory Visit: Payer: BLUE CROSS/BLUE SHIELD

## 2020-09-16 ENCOUNTER — Institutional Professional Consult (permissible substitution): Payer: BLUE CROSS/BLUE SHIELD | Admitting: Cardiology

## 2020-10-12 ENCOUNTER — Other Ambulatory Visit: Payer: BLUE CROSS/BLUE SHIELD

## 2020-11-08 ENCOUNTER — Telehealth: Payer: Self-pay | Admitting: Cardiology

## 2020-11-08 DIAGNOSIS — Z01812 Encounter for preprocedural laboratory examination: Secondary | ICD-10-CM

## 2020-11-08 DIAGNOSIS — I4589 Other specified conduction disorders: Secondary | ICD-10-CM

## 2020-11-08 DIAGNOSIS — I495 Sick sinus syndrome: Secondary | ICD-10-CM

## 2020-11-08 NOTE — Telephone Encounter (Signed)
Patient is calling to schedule surgery for a pacemaker and also to order labs per Dr. [name]

## 2020-11-08 NOTE — Telephone Encounter (Signed)
Sent mychart message letting pt know I would be in touch to schedule PPM implant

## 2020-11-12 NOTE — Addendum Note (Signed)
Addended by: Stanton Kidney on: 11/12/2020 02:23 PM   Modules accepted: Orders

## 2020-11-12 NOTE — Telephone Encounter (Signed)
Scheduled PPM implant for 5/18. Aware scheduler will call to arrange mychart video visit, with Camnitz, for 5/12 for H&P. Procedure instructions reviewed w/ pt and sent via mychart. Labs & Covid testing scheduled for 5/16.  He will pick up his surgical scrub this day.  Aware office will call to arrange post procedure follow up. Patient verbalized understanding and agreeable to plan.

## 2020-11-25 ENCOUNTER — Telehealth (INDEPENDENT_AMBULATORY_CARE_PROVIDER_SITE_OTHER): Payer: Medicare Other | Admitting: Cardiology

## 2020-11-25 ENCOUNTER — Other Ambulatory Visit: Payer: Self-pay

## 2020-11-25 ENCOUNTER — Encounter: Payer: Self-pay | Admitting: Cardiology

## 2020-11-25 DIAGNOSIS — I4589 Other specified conduction disorders: Secondary | ICD-10-CM | POA: Diagnosis not present

## 2020-11-25 NOTE — H&P (View-Only) (Signed)
Electrophysiology TeleHealth Note   Due to national recommendations of social distancing due to COVID 19, an audio/video telehealth visit is felt to be most appropriate for this patient at this time.  See Epic message for the patient's consent to telehealth for [facility].   Date:  11/25/2020   ID:  [name], DOB 1950-11-16, MRN [id number]  Location: patient's home  Provider location: [address] [place]  Evaluation Performed: Follow-up visit  PCP:  [name], MD  Cardiologist:  [name] Electrophysiologist:  Dr Curt Bears  Chief Complaint:  fatigue  History of Present Illness:    [name] is a 70 y.o. male who presents via audio/video conferencing for a telehealth visit today.  Since last being seen in our clinic, the patient reports doing very well.  Today, he denies symptoms of palpitations, chest pain, shortness of breath,  lower extremity edema, dizziness, presyncope, or syncope.  The patient is otherwise without complaint today.  The patient denies symptoms of fevers, chills, cough, or new SOB worrisome for COVID 19.  He has a history significant for Crohn's disease, asthma, dilated aortic root, and a distant history of SVT.  He also has bifascicular block.  He has been having episodes of fatigue with heart rates in the 40s.  He has not had syncope.  He wore a cardiac monitor that showed an average heart rate of 58 and a maximum of 103.  He had a stress test that showed he was only able to get his heart rate up to 100 bpm.  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  Today, denies symptoms of palpitations, chest pain, shortness of breath, orthopnea, PND, lower extremity edema, claudication, dizziness, presyncope, syncope, bleeding, or neurologic sequela. The patient is tolerating medications without difficulties.  He is currently feeling well, though he does continue to have episodes of fatigue.  He is ready for his  pacemaker implant.  Past Medical History:  Diagnosis Date  . Acute prostatitis 07/24/2007   Qualifier: Diagnosis of  By: [name] MD, [name]   . Allergy    mild  . Arthritis    osteoarthritis  . Asthma   . Blood transfusion without reported diagnosis   . BPH (benign prostatic hypertrophy) with urinary obstruction   . Crohn's ileitis ([place]) suspected 05/03/2017  . Dilated aortic root ([place])    noted on echo 08/2012  . Diverticulitis of colon   . EPIDIDYMITIS 02/15/2010   Qualifier: Diagnosis of  By: [name] MD, [name]   . GERD (gastroesophageal reflux disease)   . H/O: GI bleed   . Hemorrhoids   . Hepatitis 1975   unknown type   . HERPES SIMPLEX INFECTION 10/14/2007   Qualifier: Diagnosis of  By: [name] MD, [name]   . Hiatal hernia   . Ileus following gastrointestinal surgery ([place]) 12/26/2011  . Long term (current) use of systemic steroids 06/18/2018  . Psoriasis    sees [name] at [facility].  . Recurrent ventral incisional hernia 05/10/2012  . SVT (supraventricular tachycardia) ([place])   . Ulcer 08/21/2013   ileal    Past Surgical History:  Procedure Laterality Date  . BOWEL RESECTION  12/19/2011   Procedure: SMALL BOWEL RESECTION;  Surgeon: [name], MD;  [facility]: [facility];  Service: General;  Laterality: N/A;  with anastamosis and insertion mesh  . BRONCHOSCOPY    . COLON SURGERY  01/2004   x 2  .  COLONOSCOPY W/ BIOPSIES  04/26/2017   per [name], no polyps, benign inflammation, repeat in 5 yrs   . CYSTOSCOPY    . ESOPHAGOGASTRODUODENOSCOPY    . HEMICOLECTOMY     left side, at [facility], diverticulitis  . HEMORRHOID BANDING    . HERNIA REPAIR     [phone] incisional hernia  . ILEOSTOMY    . ILEOSTOMY CLOSURE    . INSERTION OF MESH  07/31/2012   Procedure: INSERTION OF MESH;  Surgeon: [name], MD;  [facility];  Service: General;;  . LAPAROTOMY  12/19/2011   Procedure: EXPLORATORY LAPAROTOMY;  Surgeon: [name], MD;  [facility];  Service: General;  Laterality: N/A;  . TONSILLECTOMY    . UPPER GASTROINTESTINAL ENDOSCOPY    . VASECTOMY    . VENTRAL HERNIA REPAIR  07/31/2012   Procedure: HERNIA REPAIR VENTRAL ADULT;  Surgeon: [name], MD;  [facility];  Service: General;  Laterality: N/A;    Current Outpatient Medications  Medication Sig Dispense Refill  . albuterol (PROVENTIL HFA;VENTOLIN HFA) 108 (90 Base) MCG/ACT inhaler INHALE 2 PUFFS INTO THE LUNGS EVERY 4 (FOUR) HOURS AS NEEDED FOR WHEEZING OR SHORTNESS OF BREATH. 8.5 Inhaler 2  . diphenoxylate-atropine (LOMOTIL) 2.5-0.025 MG tablet 1-2 tablets 4 times a day as neded (Patient taking differently: Take 1-2 tablets by mouth 4 (four) times daily as needed for diarrhea or loose stools. 1-2 tablets 4 times a day as neded) 90 tablet 1  . EPINEPHrine (EPIPEN 2-PAK) 0.3 mg/0.3 mL IJ SOAJ injection Inject 0.3 mLs (0.3 mg total) into the muscle as needed for anaphylaxis. (Patient not taking: No sig reported) 1 Device 0  . esomeprazole (NEXIUM) 20 MG capsule Take 20 mg by mouth daily as needed (acid reflux).    . fluticasone (CUTIVATE) 0.05 % cream Apply 1 application topically 2 (two) times daily as needed for irritation.    . halobetasol (ULTRAVATE) 0.05 % ointment Apply topically 2 (two) times daily. (Patient taking differently: Apply 1 application topically 2 (two) times daily as needed (irritation).) 50 g 5  . hyoscyamine (LEVSIN SL) 0.125 MG SL tablet PLACE 1 TABLET UNDER THE TONGUE EVERY 4 HOURS AS NEEDED FOR CRAMPING (URGENT DEFECATION) (Patient taking differently: Take 0.125 mg by mouth every 4 (four) hours as needed for cramping.) 60 tablet 2  . ketoconazole (NIZORAL) 2 % cream APPLY TWICE A DAY AS NEEDED FOR FUNGAL INFECTION (Patient taking differently: Apply 1 application topically 2 (two) times daily as needed for irritation. APPLY TWICE A DAY AS NEEDED FOR FUNGAL INFECTION) 30 g 5  . losartan (COZAAR) 25 MG tablet Take 1 tablet (25 mg total) by  mouth daily. 90 tablet 3  . Multiple Vitamins-Minerals (MULTIVITAMIN WITH MINERALS) tablet Take 1 tablet by mouth daily.    . pramoxine-hydrocortisone (ANALPRAM-HC) 1-1 % rectal cream Place 1 application rectally 2 (two) times daily. Use twice daily before SITZ baths (Patient not taking: No sig reported) 30 g 0  . tadalafil (CIALIS) 5 MG tablet Take 1 tablet (5 mg total) by mouth daily. (Patient taking differently: Take 5 mg by mouth daily as needed for erectile dysfunction.) 90 tablet 3  . traZODone (DESYREL) 100 MG tablet TAKE ONE HALF TABLETS (50 MG TOTAL) BY MOUTH AT BEDTIME. (Patient taking differently: Take 50 mg by mouth at bedtime.) 45 tablet 11  . Turmeric 500 MG CAPS Take 500 mg by mouth daily.    . vedolizumab (ENTYVIO) 300 MG injection INFUSE 300  MG IV AT WEEK 6, THEN EVERY 8 WEEKS THEREAFTER. (Patient taking differently: Inject 300 mg into the vein every 8 (eight) weeks. INFUSE 300 MG IV AT WEEK 6, THEN EVERY 8 WEEKS THEREAFTER.) 1 each 6   No current facility-administered medications for this visit.    Allergies:   Mercaptopurine, Shellfish allergy, and Humira [adalimumab]   Social History:  The patient  reports that he has never smoked. He has never used smokeless tobacco. He reports current alcohol use. He reports that he does not use drugs.   Family History:  The patient's  family history includes Heart attack in his father; Heart disease in his father; Hypertension in his father; Leukemia in his father; Lung cancer in his mother; Prostate cancer in his father and paternal uncle.   ROS:  Please see the history of present illness.   All other systems are personally reviewed and negative.    Exam:    Vital Signs:  There were no vitals taken for this visit.  Well appearing, alert and conversant, regular work of breathing,  good skin color Eyes- anicteric, neuro- grossly intact, skin- no apparent rash or lesions or cyanosis, mouth- oral mucosa is pink  Labs/Other Tests and  Data Reviewed:    Recent Labs: 05/27/2020: ALT 30; BUN 16; Creatinine, Ser 0.97; Hemoglobin 15.0; Platelets 201; Potassium 3.9; Sodium 138   Wt Readings from Last 3 Encounters:  09/07/20 218 lb (98.9 kg)  08/16/20 222 lb (100.7 kg)  07/02/20 218 lb (98.9 kg)     Other studies personally reviewed: Additional studies/ records that were reviewed today include: ECG 09/07/20  Review of the above records today demonstrates:  Sinus rhythm, rate 59   ASSESSMENT & PLAN:    1.  Chronotropic incompetence: Plan for pacemaker implant.  Risks and benefits of been discussed which include bleeding, tamponade, infection, pneumothorax.  He understands these risks and is agreed to the procedure.  Of note, he had a treadmill test which proved his chronotropic incompetence.   COVID 19 screen The patient denies symptoms of COVID 19 at this time.  The importance of social distancing was discussed today.  Follow-up:  3 months   Current medicines are reviewed at length with the patient today.   The patient does not have concerns regarding his medicines.  The following changes were made today:  none  Labs/ tests ordered today include:  No orders of the defined types were placed in this encounter.    Patient Risk:  after full review of this patients clinical status, I feel that they are at moderate risk at this time.     Signed, Roarke Marciano Meredith Leeds, MD  11/25/2020 12:03 PM     [place] [address] [place] [address] [phone] (office) [phone] (fax)

## 2020-11-25 NOTE — Progress Notes (Signed)
Electrophysiology TeleHealth Note   Due to national recommendations of social distancing due to COVID 19, an audio/video telehealth visit is felt to be most appropriate for this patient at this time.  See Epic message for the patient's consent to telehealth for [facility].   Date:  11/25/2020   ID:  [name], DOB [birth year], MRN [id number]  Location: patient's home  Provider location: [address], [place]  Evaluation Performed: Follow-up visit  PCP:  [name], MD  Cardiologist:  [name] Electrophysiologist:  Dr Curt Bears  Chief Complaint:  fatigue  History of Present Illness:    [name] is a 70 y.o. male who presents via audio/video conferencing for a telehealth visit today.  Since last being seen in our clinic, the patient reports doing very well.  Today, he denies symptoms of palpitations, chest pain, shortness of breath,  lower extremity edema, dizziness, presyncope, or syncope.  The patient is otherwise without complaint today.  The patient denies symptoms of fevers, chills, cough, or new SOB worrisome for COVID 19.  He has a history significant for Crohn's disease, asthma, dilated aortic root, and a distant history of SVT.  He also has bifascicular block.  He has been having episodes of fatigue with heart rates in the 40s.  He has not had syncope.  He wore a cardiac monitor that showed an average heart rate of 58 and a maximum of 103.  He had a stress test that showed he was only able to get his heart rate up to 100 bpm.  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  Today, denies symptoms of palpitations, chest pain, shortness of breath, orthopnea, PND, lower extremity edema, claudication, dizziness, presyncope, syncope, bleeding, or neurologic sequela. The patient is tolerating medications without difficulties.  He is currently feeling well, though he does continue to have episodes of fatigue.  He is ready for his  pacemaker implant.  Past Medical History:  Diagnosis Date  . Acute prostatitis 07/24/2007   Qualifier: Diagnosis of  By: [name] MD, [name]   . Allergy    mild  . Arthritis    osteoarthritis  . Asthma   . Blood transfusion without reported diagnosis   . BPH (benign prostatic hypertrophy) with urinary obstruction   . Crohn's ileitis ([place]) suspected 05/03/2017  . Dilated aortic root ([place])    noted on echo 08/2012  . Diverticulitis of colon   . EPIDIDYMITIS 02/15/2010   Qualifier: Diagnosis of  By: [name] MD, [name]   . GERD (gastroesophageal reflux disease)   . H/O: GI bleed   . Hemorrhoids   . Hepatitis 1975   unknown type   . HERPES SIMPLEX INFECTION 10/14/2007   Qualifier: Diagnosis of  By: [name] MD, [name]   . Hiatal hernia   . Ileus following gastrointestinal surgery ([place]) 12/26/2011  . Long term (current) use of systemic steroids 06/18/2018  . Psoriasis    sees [name] at [facility].  . Recurrent ventral incisional hernia 05/10/2012  . SVT (supraventricular tachycardia) ([place])   . Ulcer 08/21/2013   ileal    Past Surgical History:  Procedure Laterality Date  . BOWEL RESECTION  12/19/2011   Procedure: SMALL BOWEL RESECTION;  Surgeon: [name], MD;  [facility];  Service: General;  Laterality: N/A;  with anastamosis and insertion mesh  . BRONCHOSCOPY    . COLON SURGERY  01/2004   x 2  .  COLONOSCOPY W/ BIOPSIES  04/26/2017   per [name], no polyps, benign inflammation, repeat in 5 yrs   . CYSTOSCOPY    . ESOPHAGOGASTRODUODENOSCOPY    . HEMICOLECTOMY     left side, at [facility], diverticulitis  . HEMORRHOID BANDING    . HERNIA REPAIR     [phone] incisional hernia  . ILEOSTOMY    . ILEOSTOMY CLOSURE    . INSERTION OF MESH  07/31/2012   Procedure: INSERTION OF MESH;  Surgeon: [name], MD;  [facility];  Service: General;;  . LAPAROTOMY  12/19/2011   Procedure: EXPLORATORY LAPAROTOMY;  Surgeon: [name], MD;  [facility];  Service: General;  Laterality: N/A;  . TONSILLECTOMY    . UPPER GASTROINTESTINAL ENDOSCOPY    . VASECTOMY    . VENTRAL HERNIA REPAIR  07/31/2012   Procedure: HERNIA REPAIR VENTRAL ADULT;  Surgeon: [name], MD;  [facility];  Service: General;  Laterality: N/A;    Current Outpatient Medications  Medication Sig Dispense Refill  . albuterol (PROVENTIL HFA;VENTOLIN HFA) 108 (90 Base) MCG/ACT inhaler INHALE 2 PUFFS INTO THE LUNGS EVERY 4 (FOUR) HOURS AS NEEDED FOR WHEEZING OR SHORTNESS OF BREATH. 8.5 Inhaler 2  . diphenoxylate-atropine (LOMOTIL) 2.5-0.025 MG tablet 1-2 tablets 4 times a day as neded (Patient taking differently: Take 1-2 tablets by mouth 4 (four) times daily as needed for diarrhea or loose stools. 1-2 tablets 4 times a day as neded) 90 tablet 1  . EPINEPHrine (EPIPEN 2-PAK) 0.3 mg/0.3 mL IJ SOAJ injection Inject 0.3 mLs (0.3 mg total) into the muscle as needed for anaphylaxis. (Patient not taking: No sig reported) 1 Device 0  . esomeprazole (NEXIUM) 20 MG capsule Take 20 mg by mouth daily as needed (acid reflux).    . fluticasone (CUTIVATE) 0.05 % cream Apply 1 application topically 2 (two) times daily as needed for irritation.    . halobetasol (ULTRAVATE) 0.05 % ointment Apply topically 2 (two) times daily. (Patient taking differently: Apply 1 application topically 2 (two) times daily as needed (irritation).) 50 g 5  . hyoscyamine (LEVSIN SL) 0.125 MG SL tablet PLACE 1 TABLET UNDER THE TONGUE EVERY 4 HOURS AS NEEDED FOR CRAMPING (URGENT DEFECATION) (Patient taking differently: Take 0.125 mg by mouth every 4 (four) hours as needed for cramping.) 60 tablet 2  . ketoconazole (NIZORAL) 2 % cream APPLY TWICE A DAY AS NEEDED FOR FUNGAL INFECTION (Patient taking differently: Apply 1 application topically 2 (two) times daily as needed for irritation. APPLY TWICE A DAY AS NEEDED FOR FUNGAL INFECTION) 30 g 5  . losartan (COZAAR) 25 MG tablet Take 1 tablet (25 mg total) by  mouth daily. 90 tablet 3  . Multiple Vitamins-Minerals (MULTIVITAMIN WITH MINERALS) tablet Take 1 tablet by mouth daily.    . pramoxine-hydrocortisone (ANALPRAM-HC) 1-1 % rectal cream Place 1 application rectally 2 (two) times daily. Use twice daily before SITZ baths (Patient not taking: No sig reported) 30 g 0  . tadalafil (CIALIS) 5 MG tablet Take 1 tablet (5 mg total) by mouth daily. (Patient taking differently: Take 5 mg by mouth daily as needed for erectile dysfunction.) 90 tablet 3  . traZODone (DESYREL) 100 MG tablet TAKE ONE HALF TABLETS (50 MG TOTAL) BY MOUTH AT BEDTIME. (Patient taking differently: Take 50 mg by mouth at bedtime.) 45 tablet 11  . Turmeric 500 MG CAPS Take 500 mg by mouth daily.    . vedolizumab (ENTYVIO) 300 MG injection INFUSE 300  MG IV AT WEEK 6, THEN EVERY 8 WEEKS THEREAFTER. (Patient taking differently: Inject 300 mg into the vein every 8 (eight) weeks. INFUSE 300 MG IV AT WEEK 6, THEN EVERY 8 WEEKS THEREAFTER.) 1 each 6   No current facility-administered medications for this visit.    Allergies:   Mercaptopurine, Shellfish allergy, and Humira [adalimumab]   Social History:  The patient  reports that he has never smoked. He has never used smokeless tobacco. He reports current alcohol use. He reports that he does not use drugs.   Family History:  The patient's  family history includes Heart attack in his father; Heart disease in his father; Hypertension in his father; Leukemia in his father; Lung cancer in his mother; Prostate cancer in his father and paternal uncle.   ROS:  Please see the history of present illness.   All other systems are personally reviewed and negative.    Exam:    Vital Signs:  There were no vitals taken for this visit.  Well appearing, alert and conversant, regular work of breathing,  good skin color Eyes- anicteric, neuro- grossly intact, skin- no apparent rash or lesions or cyanosis, mouth- oral mucosa is pink  Labs/Other Tests and  Data Reviewed:    Recent Labs: 05/27/2020: ALT 30; BUN 16; Creatinine, Ser 0.97; Hemoglobin 15.0; Platelets 201; Potassium 3.9; Sodium 138   Wt Readings from Last 3 Encounters:  09/07/20 218 lb (98.9 kg)  08/16/20 222 lb (100.7 kg)  07/02/20 218 lb (98.9 kg)     Other studies personally reviewed: Additional studies/ records that were reviewed today include: ECG 09/07/20  Review of the above records today demonstrates:  Sinus rhythm, rate 59   ASSESSMENT & PLAN:    1.  Chronotropic incompetence: Plan for pacemaker implant.  Risks and benefits of been discussed which include bleeding, tamponade, infection, pneumothorax.  He understands these risks and is agreed to the procedure.  Of note, he had a treadmill test which proved his chronotropic incompetence.   COVID 19 screen The patient denies symptoms of COVID 19 at this time.  The importance of social distancing was discussed today.  Follow-up:  3 months   Current medicines are reviewed at length with the patient today.   The patient does not have concerns regarding his medicines.  The following changes were made today:  none  Labs/ tests ordered today include:  No orders of the defined types were placed in this encounter.    Patient Risk:  after full review of this patients clinical status, I feel that they are at moderate risk at this time.     Signed, Tarena Gockley Meredith Leeds, MD  11/25/2020 12:03 PM     [place] [address] [place] [address] [phone] (office) [phone] (fax)

## 2020-11-29 ENCOUNTER — Other Ambulatory Visit: Payer: Self-pay

## 2020-11-29 ENCOUNTER — Other Ambulatory Visit: Payer: Medicare Other | Admitting: *Deleted

## 2020-11-29 ENCOUNTER — Other Ambulatory Visit (HOSPITAL_COMMUNITY)
Admission: RE | Admit: 2020-11-29 | Discharge: 2020-11-29 | Disposition: A | Discharge status: Home / Self Care | Payer: Medicare Other | Source: Ambulatory Visit | Attending: Cardiology | Admitting: Cardiology

## 2020-11-29 DIAGNOSIS — Z01812 Encounter for preprocedural laboratory examination: Secondary | ICD-10-CM

## 2020-11-29 DIAGNOSIS — Z20822 Contact with and (suspected) exposure to covid-19: Secondary | ICD-10-CM | POA: Insufficient documentation

## 2020-11-29 DIAGNOSIS — I495 Sick sinus syndrome: Secondary | ICD-10-CM

## 2020-11-29 DIAGNOSIS — I4589 Other specified conduction disorders: Secondary | ICD-10-CM

## 2020-11-29 LAB — CBC
Hematocrit: 44.3 % (ref 37.5–51.0)
Hemoglobin: 14.9 g/dL (ref 13.0–17.7)
MCH: 29.7 pg (ref 26.6–33.0)
MCHC: 33.6 g/dL (ref 31.5–35.7)
MCV: 88 fL (ref 79–97)
Platelets: 184 10*3/uL (ref 150–450)
RBC: 5.01 x10E6/uL (ref 4.14–5.80)
RDW: 14.3 % (ref 11.6–15.4)
WBC: 8.9 10*3/uL (ref 3.4–10.8)

## 2020-11-29 LAB — BASIC METABOLIC PANEL
BUN/Creatinine Ratio: 12 (ref 10–24)
BUN: 11 mg/dL (ref 8–27)
CO2: 24 mmol/L (ref 20–29)
Calcium: 9 mg/dL (ref 8.6–10.2)
Chloride: 105 mmol/L (ref 96–106)
Creatinine, Ser: 0.92 mg/dL (ref 0.76–1.27)
Glucose: 111 mg/dL — ABNORMAL HIGH (ref 65–99)
Potassium: 4.6 mmol/L (ref 3.5–5.2)
Sodium: 141 mmol/L (ref 134–144)
eGFR: 91 mL/min/{1.73_m2} (ref >59)

## 2020-11-30 LAB — SARS CORONAVIRUS 2 (TAT 6-24 HRS): SARS Coronavirus 2: NEGATIVE

## 2020-11-30 NOTE — Pre-Procedure Instructions (Signed)
Instructed patient on the following items: Arrival time 1230 Nothing to eat or drink after midnight No meds AM of procedure Responsible person to drive you home and stay with you for 24 hrs Wash with special soap night before and morning of procedure

## 2020-12-01 ENCOUNTER — Other Ambulatory Visit: Payer: Self-pay

## 2020-12-01 ENCOUNTER — Encounter (HOSPITAL_COMMUNITY)
Admission: RE | Disposition: A | Discharge status: Home / Self Care | Payer: Self-pay | Source: Home / Self Care | Attending: Cardiology

## 2020-12-01 ENCOUNTER — Ambulatory Visit (HOSPITAL_COMMUNITY)
Admission: RE | Admit: 2020-12-01 | Discharge: 2020-12-01 | Disposition: A | Discharge status: Home / Self Care | Payer: Medicare Other | Attending: Cardiology | Admitting: Cardiology

## 2020-12-01 ENCOUNTER — Ambulatory Visit (HOSPITAL_COMMUNITY): Payer: Medicare Other

## 2020-12-01 DIAGNOSIS — K759 Inflammatory liver disease, unspecified: Secondary | ICD-10-CM | POA: Diagnosis not present

## 2020-12-01 DIAGNOSIS — Z8719 Personal history of other diseases of the digestive system: Secondary | ICD-10-CM | POA: Insufficient documentation

## 2020-12-01 DIAGNOSIS — I4589 Other specified conduction disorders: Secondary | ICD-10-CM | POA: Insufficient documentation

## 2020-12-01 DIAGNOSIS — Z8249 Family history of ischemic heart disease and other diseases of the circulatory system: Secondary | ICD-10-CM | POA: Insufficient documentation

## 2020-12-01 DIAGNOSIS — Z79899 Other long term (current) drug therapy: Secondary | ICD-10-CM | POA: Diagnosis not present

## 2020-12-01 DIAGNOSIS — I495 Sick sinus syndrome: Secondary | ICD-10-CM | POA: Insufficient documentation

## 2020-12-01 DIAGNOSIS — Z95818 Presence of other cardiac implants and grafts: Secondary | ICD-10-CM

## 2020-12-01 DIAGNOSIS — Z9852 Vasectomy status: Secondary | ICD-10-CM | POA: Insufficient documentation

## 2020-12-01 DIAGNOSIS — Z888 Allergy status to other drugs, medicaments and biological substances status: Secondary | ICD-10-CM | POA: Insufficient documentation

## 2020-12-01 HISTORY — PX: PACEMAKER IMPLANT: EP1218

## 2020-12-01 SURGERY — PACEMAKER IMPLANT

## 2020-12-01 MED ORDER — MIDAZOLAM HCL 5 MG/5ML IJ SOLN
INTRAMUSCULAR | Status: AC
  Filled 2020-12-01: qty 5

## 2020-12-01 MED ORDER — CEFAZOLIN SODIUM-DEXTROSE 1-4 GM/50ML-% IV SOLN
1.0000 g | Freq: Four times a day (QID) | INTRAVENOUS | Status: DC
Start: 2020-12-01 — End: 2020-12-02

## 2020-12-01 MED ORDER — HEPARIN (PORCINE) IN NACL 1000-0.9 UT/500ML-% IV SOLN
INTRAVENOUS | Status: AC
  Filled 2020-12-01: qty 500

## 2020-12-01 MED ORDER — CHLORHEXIDINE GLUCONATE 4 % EX LIQD
4.0000 "application " | Freq: Once | CUTANEOUS | Status: DC
  Filled 2020-12-01: qty 60

## 2020-12-01 MED ORDER — CEFAZOLIN SODIUM-DEXTROSE 2-4 GM/100ML-% IV SOLN
2.0000 g | INTRAVENOUS | Status: AC
  Administered 2020-12-01: 2 g via INTRAVENOUS

## 2020-12-01 MED ORDER — SODIUM CHLORIDE 0.9 % IV SOLN
80.0000 mg | INTRAVENOUS | Status: AC
  Administered 2020-12-01: 80 mg

## 2020-12-01 MED ORDER — CEFAZOLIN SODIUM-DEXTROSE 2-4 GM/100ML-% IV SOLN
INTRAVENOUS | Status: AC
  Filled 2020-12-01: qty 100

## 2020-12-01 MED ORDER — ACETAMINOPHEN 325 MG PO TABS
ORAL_TABLET | ORAL | Status: AC
  Filled 2020-12-01: qty 2

## 2020-12-01 MED ORDER — ACETAMINOPHEN 325 MG PO TABS
325.0000 mg | ORAL_TABLET | ORAL | Status: DC | PRN
  Administered 2020-12-01: 650 mg via ORAL

## 2020-12-01 MED ORDER — LIDOCAINE HCL (PF) 1 % IJ SOLN
INTRAMUSCULAR | Status: AC
  Filled 2020-12-01: qty 90

## 2020-12-01 MED ORDER — SODIUM CHLORIDE 0.9 % IV SOLN: INTRAVENOUS | Status: DC | PRN

## 2020-12-01 MED ORDER — SODIUM CHLORIDE 0.9 % IV SOLN
INTRAVENOUS | Status: AC
  Filled 2020-12-01: qty 2

## 2020-12-01 MED ORDER — HEPARIN (PORCINE) IN NACL 1000-0.9 UT/500ML-% IV SOLN
INTRAVENOUS | Status: DC | PRN
  Administered 2020-12-01: 500 mL

## 2020-12-01 MED ORDER — SODIUM CHLORIDE 0.9 % IV SOLN: INTRAVENOUS | Status: DC

## 2020-12-01 MED ORDER — FENTANYL CITRATE (PF) 100 MCG/2ML IJ SOLN
INTRAMUSCULAR | Status: AC
  Filled 2020-12-01: qty 2

## 2020-12-01 MED ORDER — LIDOCAINE HCL (PF) 1 % IJ SOLN
INTRAMUSCULAR | Status: DC | PRN
  Administered 2020-12-01: 60 mL

## 2020-12-01 MED ORDER — FENTANYL CITRATE (PF) 100 MCG/2ML IJ SOLN
INTRAMUSCULAR | Status: DC | PRN
  Administered 2020-12-01 (×6): 25 ug via INTRAVENOUS

## 2020-12-01 MED ORDER — MIDAZOLAM HCL 5 MG/5ML IJ SOLN
INTRAMUSCULAR | Status: DC | PRN
  Administered 2020-12-01 (×6): 1 mg via INTRAVENOUS

## 2020-12-01 MED ORDER — ONDANSETRON HCL 4 MG/2ML IJ SOLN: 4.0000 mg | Freq: Four times a day (QID) | INTRAMUSCULAR | Status: DC | PRN

## 2020-12-01 SURGICAL SUPPLY — 11 items
CABLE SURGICAL S-101-97-12 (CABLE) ×3 IMPLANT
KIT MICROPUNCTURE NIT STIFF (SHEATH) ×1 IMPLANT
LEAD ISOFLEX OPT 1944-52CM (Lead) ×1 IMPLANT
LEAD TENDRIL MRI 52CM LPA1200M (Lead) ×1 IMPLANT
LEAD TENDRIL MRI 58CM LPA1200M (Lead) ×1 IMPLANT
PACEMAKER ASSURITY DR-RF (Pacemaker) ×1 IMPLANT
PAD PRO RADIOLUCENT 2001M-C (PAD) ×2 IMPLANT
SHEATH 7FR PRELUDE SNAP 13 (SHEATH) ×1 IMPLANT
SHEATH 8FR PRELUDE SNAP 13 (SHEATH) ×2 IMPLANT
SHEATH PROBE COVER 6X72 (BAG) ×1 IMPLANT
TRAY PACEMAKER INSERTION (PACKS) ×2 IMPLANT

## 2020-12-01 NOTE — Interval H&P Note (Signed)
History and Physical Interval Note:  12/01/2020 1:40 PM  [name]  has presented today for surgery, with the diagnosis of bradycardia.  The various methods of treatment have been discussed with the patient and family. After consideration of risks, benefits and other options for treatment, the patient has consented to  Procedure(s): PACEMAKER IMPLANT (N/A) as a surgical intervention.  The patient's history has been reviewed, patient examined, no change in status, stable for surgery.  I have reviewed the patient's chart and labs.  Questions were answered to the patient's satisfaction.     [place] [employer]

## 2020-12-01 NOTE — Discharge Instructions (Signed)
After Your Pacemaker   . You have a St. Jude Pacemaker  ACTIVITY . Do not lift your arm above shoulder height for 1 week after your procedure. After 7 days, you may progress as below.  . You should remove your sling 24 hours after your procedure, unless otherwise instructed by your provider.     Wednesday Dec 08, 2020  Thursday Dec 09, 2020 Friday Dec 10, 2020 Saturday Dec 11, 2020   . Do not lift, push, pull, or carry anything over 10 pounds with the affected arm until 6 weeks (Wednesday January 12, 2021 ) after your procedure.   . You may drive AFTER your wound check, unless you have been told otherwise by your provider.   . Ask your healthcare provider when you can go back to work   INCISION/Dressing . If you are on a blood thinner such as Coumadin, Xarelto, Eliquis, Plavix, or Pradaxa please confirm with your provider when this should be resumed.   . If large square, outer bandage is left in place, this can be removed after 24 hours from your procedure. Do not remove steri-strips or glue as below.   . Monitor your Pacemaker site for redness, swelling, and drainage. Call the device clinic at [phone] if you experience these symptoms or fever/chills.  . If your incision is sealed with Steri-strips or staples, you may shower 10 days after your procedure or when told by your provider. Do not remove the steri-strips or let the shower hit directly on your site. You may wash around your site with soap and water.    Marland Kitchen Avoid lotions, ointments, or perfumes over your incision until it is well-healed.  . You may use a hot tub or a pool AFTER your wound check appointment if the incision is completely closed.  Marland Kitchen PAcemaker Alerts:  Some alerts are vibratory and others beep. These are NOT emergencies. Please call our office to let us know. If this occurs at night or on weekends, it can wait until the next business day. Send a remote transmission.  . If your device is capable of reading fluid  status (for heart failure), you will be offered monthly monitoring to review this with you.   DEVICE MANAGEMENT . Remote monitoring is used to monitor your pacemaker from home. This monitoring is scheduled every 91 days by our office. It allows [place] to keep an eye on the functioning of your device to ensure it is working properly. You will routinely see your Electrophysiologist annually (more often if necessary).   . You should receive your ID card for your new device in 4-8 weeks. Keep this card with you at all times once received. Consider wearing a medical alert bracelet or necklace.  . Your Pacemaker may be MRI compatible. This will be discussed at your next office visit/wound check.  You should avoid contact with strong electric or magnetic fields.    Do not use amateur (ham) radio equipment or electric (arc) welding torches. MP3 player headphones with magnets should not be used. Some devices are safe to use if held at least 12 inches (30 cm) from your Pacemaker. These include power tools, lawn mowers, and speakers. If you are unsure if something is safe to use, ask your health care provider.   When using your cell phone, hold it to the ear that is on the opposite side from the Pacemaker. Do not leave your cell phone in a pocket over the Pacemaker.   You may safely use  electric blankets, heating pads, computers, and microwave ovens.  Call the office right away if:  You have chest pain.  You feel more short of breath than you have felt before.  You feel more light-headed than you have felt before.  Your incision starts to open up.  This information is not intended to replace advice given to you by your health care provider. Make sure you discuss any questions you have with your health care provider.

## 2020-12-02 ENCOUNTER — Encounter (HOSPITAL_COMMUNITY): Payer: Self-pay | Admitting: Cardiology

## 2020-12-06 ENCOUNTER — Telehealth: Payer: Self-pay | Admitting: Cardiology

## 2020-12-06 NOTE — Telephone Encounter (Signed)
The patient states when he took the outer bandage was. Red looks like a burn. Hurts when he touch it. I let him speak with Leigh, rn.

## 2020-12-06 NOTE — Telephone Encounter (Signed)
Has wound check 12/14/20.

## 2020-12-06 NOTE — Telephone Encounter (Signed)
Advised patient that he may use hydrocortisone 1% around red area but to keep the incision site free or cream. Advised patient to keep area clean and dry. Advised patient if it does not seem to get better in time to please give [place] a call. Patient agreeable to plan.

## 2020-12-07 ENCOUNTER — Ambulatory Visit: Payer: BLUE CROSS/BLUE SHIELD | Admitting: Cardiology

## 2020-12-14 ENCOUNTER — Ambulatory Visit (INDEPENDENT_AMBULATORY_CARE_PROVIDER_SITE_OTHER): Payer: Medicare Other | Admitting: Emergency Medicine

## 2020-12-14 ENCOUNTER — Other Ambulatory Visit: Payer: Self-pay

## 2020-12-14 DIAGNOSIS — I495 Sick sinus syndrome: Secondary | ICD-10-CM | POA: Diagnosis not present

## 2020-12-14 LAB — CUP PACEART INCLINIC DEVICE CHECK
Battery Remaining Longevity: 92 mo
Battery Voltage: 3.07 V
Brady Statistic RA Percent Paced: 72 %
Brady Statistic RV Percent Paced: 0.13 %
Date Time Interrogation Session: 20220531111007
Implantable Lead Implant Date: 20220518
Implantable Lead Implant Date: 20220518
Implantable Lead Location: 753859
Implantable Lead Location: 753860
Implantable Lead Model: 1944
Implantable Pulse Generator Implant Date: 20220518
Lead Channel Impedance Value: 487.5 Ohm
Lead Channel Impedance Value: 625 Ohm
Lead Channel Pacing Threshold Amplitude: 0.75 V
Lead Channel Pacing Threshold Amplitude: 0.75 V
Lead Channel Pacing Threshold Pulse Width: 0.5 ms
Lead Channel Pacing Threshold Pulse Width: 0.5 ms
Lead Channel Sensing Intrinsic Amplitude: 5 mV
Lead Channel Sensing Intrinsic Amplitude: 6.4 mV
Lead Channel Setting Pacing Amplitude: 3.5 V
Lead Channel Setting Pacing Amplitude: 3.5 V
Lead Channel Setting Pacing Pulse Width: 0.5 ms
Lead Channel Setting Sensing Sensitivity: 2 mV
Pulse Gen Model: 2272
Pulse Gen Serial Number: 3925308

## 2020-12-14 NOTE — Progress Notes (Signed)
Wound check appointment. Steri-strips removed. Wound without redness or edema. Incision edges approximated, wound well healed. Normal device function. Thresholds, sensing, and impedances consistent with implant measurements. Device programmed at 3.5V/auto capture programmed on for extra safety margin until 3 month visit. Histogram distribution appropriate for patient and level of activity. AT/AF burden < 1%, 1 AMS episode with EGM that showed AF with controlled v-rate that was 12 seconds duration. No  high ventricular rates noted. Patient educated about wound care, arm mobility, lifting restrictions. ROV with Dr Curt Bears 03/07/21. Enrolled in remote follow-up and next remote 03/04/21.

## 2020-12-29 ENCOUNTER — Emergency Department (HOSPITAL_BASED_OUTPATIENT_CLINIC_OR_DEPARTMENT_OTHER): Payer: Medicare Other | Admitting: Radiology

## 2020-12-29 ENCOUNTER — Emergency Department (HOSPITAL_BASED_OUTPATIENT_CLINIC_OR_DEPARTMENT_OTHER)
Admission: EM | Admit: 2020-12-29 | Discharge: 2020-12-29 | Disposition: A | Discharge status: Home / Self Care | Payer: Medicare Other | Attending: Emergency Medicine | Admitting: Emergency Medicine

## 2020-12-29 ENCOUNTER — Telehealth: Payer: Self-pay

## 2020-12-29 ENCOUNTER — Other Ambulatory Visit: Payer: Self-pay

## 2020-12-29 ENCOUNTER — Encounter (HOSPITAL_BASED_OUTPATIENT_CLINIC_OR_DEPARTMENT_OTHER): Payer: Self-pay

## 2020-12-29 DIAGNOSIS — Z950 Presence of cardiac pacemaker: Secondary | ICD-10-CM | POA: Insufficient documentation

## 2020-12-29 DIAGNOSIS — R079 Chest pain, unspecified: Secondary | ICD-10-CM

## 2020-12-29 DIAGNOSIS — J45909 Unspecified asthma, uncomplicated: Secondary | ICD-10-CM | POA: Insufficient documentation

## 2020-12-29 DIAGNOSIS — R42 Dizziness and giddiness: Secondary | ICD-10-CM | POA: Insufficient documentation

## 2020-12-29 DIAGNOSIS — R0789 Other chest pain: Secondary | ICD-10-CM | POA: Diagnosis present

## 2020-12-29 LAB — BASIC METABOLIC PANEL
Anion gap: 9 (ref 5–15)
BUN: 15 mg/dL (ref 8–23)
CO2: 26 mmol/L (ref 22–32)
Calcium: 9.1 mg/dL (ref 8.9–10.3)
Chloride: 105 mmol/L (ref 98–111)
Creatinine, Ser: 1.01 mg/dL (ref 0.61–1.24)
GFR, Estimated: >60 mL/min (ref >60)
Glucose, Bld: 127 mg/dL — ABNORMAL HIGH (ref 70–99)
Potassium: 3.7 mmol/L (ref 3.5–5.1)
Sodium: 140 mmol/L (ref 135–145)

## 2020-12-29 LAB — CBC
HCT: 46.7 % (ref 39.0–52.0)
Hemoglobin: 15.3 g/dL (ref 13.0–17.0)
MCH: 29.1 pg (ref 26.0–34.0)
MCHC: 32.8 g/dL (ref 30.0–36.0)
MCV: 88.8 fL (ref 80.0–100.0)
Platelets: 197 10*3/uL (ref 150–400)
RBC: 5.26 MIL/uL (ref 4.22–5.81)
RDW: 13.2 % (ref 11.5–15.5)
WBC: 8.1 10*3/uL (ref 4.0–10.5)
nRBC: 0 % (ref 0.0–0.2)

## 2020-12-29 LAB — TROPONIN I (HIGH SENSITIVITY): Troponin I (High Sensitivity): 4 ng/L (ref <18)

## 2020-12-29 NOTE — Telephone Encounter (Signed)
ER MD (Dr. Reather Converse) at [facility] called and spoke with Dr. Burt Knack (DOD). The patient will need an appointment with Dr. Curt Bears or EP APP for follow-up post ER visit.  Will send to EP scheduler to arrange.

## 2020-12-29 NOTE — Discharge Instructions (Addendum)
Call your cardiologist for follow-up appointment. Return for new or worsening signs or symptoms. Take Tylenol every 4 hours as needed for pain.

## 2020-12-29 NOTE — ED Triage Notes (Signed)
He c/o waxing and waning central chest discomfort since this Sunday. He further tells me that he has asthma, Crohn's dis., and reflux. His skin is normal, warm and dry and he is breathing normally.

## 2020-12-29 NOTE — ED Notes (Signed)
I have just performed pacemaker interrogation with the Merlin device. We are awaiting the data to be faxed to [place].

## 2020-12-29 NOTE — ED Provider Notes (Signed)
[facility] EMERGENCY DEPT Provider Note   CSN: [id number] Arrival date & time: 12/29/20  3220     History Chief Complaint  Patient presents with   Chest Pain    [name] is a 70 y.o. male.  Patient with history of Crohn's, asthma, arthritis, hiatal hernia, reflux presents with fairly persistent central chest discomfort nonradiating since Sunday.  Intermittently will worsen.  No association with exertion or food.  Patient thought possibly related to reflux.  No diaphoresis or vomiting.  No shortness of breath or syncope.  Minimal lightheadedness.  Patient had pacemaker placed and followed by cardiologist since May.  History of SVT.  No blood clot history, no leg edema.      Past Medical History:  Diagnosis Date   Acute prostatitis 07/24/2007   Qualifier: Diagnosis of  By: [name] MD, [name]    Allergy    mild   Arthritis    osteoarthritis   Asthma    Blood transfusion without reported diagnosis    BPH (benign prostatic hypertrophy) with urinary obstruction    Crohn's ileitis ([place]) suspected 05/03/2017   Dilated aortic root ([place])    noted on echo 08/2012   Diverticulitis of colon    EPIDIDYMITIS 02/15/2010   Qualifier: Diagnosis of  By: [name] MD, [name]    GERD (gastroesophageal reflux disease)    H/O: GI bleed    Hemorrhoids    Hepatitis 1975   unknown type    HERPES SIMPLEX INFECTION 10/14/2007   Qualifier: Diagnosis of  By: [name] MD, [name]    Hiatal hernia    Ileus following gastrointestinal surgery ([employer]) 12/26/2011   Long term (current) use of systemic steroids 06/18/2018   Psoriasis    sees [name] at [facility].   Recurrent ventral incisional hernia 05/10/2012   SVT (supraventricular tachycardia) ([place])    Ulcer 08/21/2013   ileal    Patient Active Problem List   Diagnosis Date Noted   External hemorrhoid, thrombosed 05/27/2019   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   Drug-induced acute pancreatitis without infection or  necrosis - from 6 MP 09/24/2017   Long-term use of immunosuppressant medication  - Entyvio 06/28/2017   Crohn's ileitis ([place])  05/03/2017   Psoriasis 08/23/2016   Insomnia 04/22/2014   Enlarged thoracic aorta ([place]) 09/06/2012   SVT (supraventricular tachycardia) ([place]) 08/04/2012   Vitamin D deficiency 06/18/2012   ADHD 05/28/2009   BENIGN PROSTATIC HYPERTROPHY 07/24/2007   Asthma 03/06/2007   GERD 03/06/2007   DIVERTICULITIS, HX OF 03/06/2007    Past Surgical History:  Procedure Laterality Date   BOWEL RESECTION  12/19/2011   Procedure: SMALL BOWEL RESECTION;  Surgeon: [name], MD;  Location: [facility];  Service: General;  Laterality: N/A;  with anastamosis and insertion mesh   BRONCHOSCOPY     COLON SURGERY  01/2004   x 2   COLONOSCOPY W/ BIOPSIES  04/26/2017   per [name], no polyps, benign inflammation, repeat in 5 yrs    CYSTOSCOPY     ESOPHAGOGASTRODUODENOSCOPY     HEMICOLECTOMY     left side, at [facility], diverticulitis   HEMORRHOID [place]     2'5427 incisional hernia   ILEOSTOMY     ILEOSTOMY CLOSURE     INSERTION OF MESH  07/31/2012   Procedure: INSERTION OF MESH;  Surgeon: [name], MD;  Location: [facility];  Service: General;;   LAPAROTOMY  12/19/2011  Procedure: EXPLORATORY LAPAROTOMY;  Surgeon: [name], MD;  Location: [facility];  Service: General;  Laterality: N/A;   PACEMAKER IMPLANT N/A 12/01/2020   Procedure: PACEMAKER IMPLANT;  Surgeon: [name], MD;  Location: [facility] CV LAB;  Service: Cardiovascular;  Laterality: N/A;   PACEMAKER INSERTION Left    TONSILLECTOMY     UPPER GASTROINTESTINAL ENDOSCOPY     VASECTOMY     VENTRAL HERNIA REPAIR  07/31/2012   Procedure: HERNIA REPAIR VENTRAL ADULT;  Surgeon: [name], MD;  Location: [facility];  Service: General;  Laterality: N/A;       Family History  Problem Relation Age of Onset   Lung cancer Mother    Leukemia Father     Hypertension Father    Heart disease Father    Heart attack Father    Prostate cancer Father    Prostate cancer Paternal Uncle    Colon cancer Neg Hx    Stomach cancer Neg Hx    Colon polyps Neg Hx    Esophageal cancer Neg Hx    Rectal cancer Neg Hx     Social History   Tobacco Use   Smoking status: Never   Smokeless tobacco: Never  Vaping Use   Vaping Use: Never used  Substance Use Topics   Alcohol use: Yes    Alcohol/week: 0.0 standard drinks    Comment: occ   Drug use: No    Home Medications Prior to Admission medications   Medication Sig Start Date End Date Taking? Authorizing Provider  albuterol (PROVENTIL HFA;VENTOLIN HFA) 108 (90 Base) MCG/ACT inhaler INHALE 2 PUFFS INTO THE LUNGS EVERY 4 (FOUR) HOURS AS NEEDED FOR WHEEZING OR SHORTNESS OF BREATH. 10/24/18   [name], MD  diphenoxylate-atropine (LOMOTIL) 2.5-0.025 MG tablet 1-2 tablets 4 times a day as neded Patient taking differently: Take 1-2 tablets by mouth 4 (four) times daily as needed for diarrhea or loose stools. 1-2 tablets 4 times a day as neded 05/06/18   Gatha Mayer, MD  EPINEPHrine (EPIPEN 2-PAK) 0.3 mg/0.3 mL IJ SOAJ injection Inject 0.3 mLs (0.3 mg total) into the muscle as needed for anaphylaxis. 11/09/18   Sherwood Gambler, MD  esomeprazole (NEXIUM) 20 MG capsule Take 20 mg by mouth daily as needed (acid reflux).    [provider]  fluticasone (CUTIVATE) 0.05 % cream Apply 1 application topically 2 (two) times daily as needed for irritation. 11/02/20   [provider]  halobetasol (ULTRAVATE) 0.05 % ointment Apply topically 2 (two) times daily. Patient taking differently: Apply 1 application topically 2 (two) times daily as needed (irritation). 01/08/18   [name], MD  hyoscyamine (LEVSIN SL) 0.125 MG SL tablet PLACE 1 TABLET UNDER THE TONGUE EVERY 4 HOURS AS NEEDED FOR CRAMPING (URGENT DEFECATION) Patient taking differently: Take 0.125 mg by mouth every 4 (four) hours as  needed for cramping. 10/19/17   Gatha Mayer, MD  ketoconazole (NIZORAL) 2 % cream APPLY TWICE A DAY AS NEEDED FOR FUNGAL INFECTION Patient taking differently: Apply 1 application topically 2 (two) times daily as needed for irritation. APPLY TWICE A DAY AS NEEDED FOR FUNGAL INFECTION 08/16/20   [name], MD  losartan (COZAAR) 25 MG tablet Take 1 tablet (25 mg total) by mouth daily. 08/25/20   Josue Hector, MD  Multiple Vitamins-Minerals (MULTIVITAMIN WITH MINERALS) tablet Take 1 tablet by mouth daily.    [provider]  pramoxine-hydrocortisone [facility]) 1-1 % rectal cream Place 1  application rectally 2 (two) times daily. Use twice daily before SITZ baths 04/21/19   Milus Banister, MD  tadalafil (CIALIS) 5 MG tablet Take 1 tablet (5 mg total) by mouth daily. Patient taking differently: Take 5 mg by mouth daily as needed for erectile dysfunction. 11/23/17   [name], MD  traZODone (DESYREL) 100 MG tablet TAKE ONE HALF TABLETS (50 MG TOTAL) BY MOUTH AT BEDTIME. Patient taking differently: Take 50 mg by mouth at bedtime. 05/24/20   [name], MD  Turmeric 500 MG CAPS Take 500 mg by mouth daily.    [provider]  vedolizumab (ENTYVIO) 300 MG injection INFUSE 300 MG IV AT WEEK 6, THEN EVERY 8 WEEKS THEREAFTER. Patient taking differently: Inject 300 mg into the vein every 8 (eight) weeks. INFUSE 300 MG IV AT WEEK 6, THEN EVERY 8 WEEKS THEREAFTER. 03/24/20   Gatha Mayer, MD    Allergies    Mercaptopurine, Shellfish allergy, and Humira [adalimumab]  Review of Systems   Review of Systems  Constitutional:  Negative for chills and fever.  HENT:  Negative for congestion.   Eyes:  Negative for visual disturbance.  Respiratory:  Negative for shortness of breath.   Cardiovascular:  Positive for chest pain. Negative for leg swelling.  Gastrointestinal:  Negative for abdominal pain and vomiting.  Genitourinary:  Negative for dysuria and flank pain.   Musculoskeletal:  Negative for back pain, neck pain and neck stiffness.  Skin:  Negative for rash.  Neurological:  Positive for light-headedness. Negative for syncope, weakness and headaches.   Physical Exam Updated Vital Signs BP 131/69   Pulse 60   Temp 97.8 F (36.6 C) (Oral)   Resp 14   Ht 5' 10"  (1.778 m)   Wt 94.8 kg   SpO2 99%   BMI 29.99 kg/m   Physical Exam Vitals and nursing note reviewed.  Constitutional:      General: He is not in acute distress.    Appearance: He is well-developed.  HENT:     Head: Normocephalic and atraumatic.     Mouth/Throat:     Mouth: Mucous membranes are dry.  Eyes:     General:        Right eye: No discharge.        Left eye: No discharge.     Conjunctiva/sclera: Conjunctivae normal.  Neck:     Trachea: No tracheal deviation.  Cardiovascular:     Rate and Rhythm: Normal rate and regular rhythm.     Heart sounds: No murmur heard. Pulmonary:     Effort: Pulmonary effort is normal.     Breath sounds: Normal breath sounds.  Chest:     Chest wall: No edema.  Abdominal:     General: There is no distension.     Palpations: Abdomen is soft.     Tenderness: There is no abdominal tenderness. There is no guarding.  Musculoskeletal:     Cervical back: Normal range of motion and neck supple.  Skin:    General: Skin is warm.     Capillary Refill: Capillary refill takes less than 2 seconds.     Findings: No rash.     Comments: No signs of infection or swelling over pacemaker site left upper chest.  Neurological:     General: No focal deficit present.     Mental Status: He is alert and oriented to person, place, and time.     Cranial Nerves: No cranial nerve deficit.  Motor: No weakness.  Psychiatric:        Mood and Affect: Mood normal.    ED Results / Procedures / Treatments   Labs (all labs ordered are listed, but only abnormal results are displayed) Labs Reviewed  BASIC METABOLIC PANEL - Abnormal; Notable for the following  components:      Result Value   Glucose, Bld 127 (*)    All other components within normal limits  CBC  TROPONIN I (HIGH SENSITIVITY)    EKG EKG Interpretation  Date/Time:  Wednesday December 29 2020 08:43:36 EDT Ventricular Rate:  60 PR Interval:  186 QRS Duration: 110 QT Interval:  422 QTC Calculation: 422 R Axis:   -68 Text Interpretation: Sinus rhythm Incomplete RBBB and LAFB Abnormal R-wave progression, late transition Confirmed by Elnora Morrison [phone]) on 12/29/2020 9:39:06 AM  Radiology DG Chest 2 View  Result Date: 12/29/2020 CLINICAL DATA:  Chest pain for several days EXAM: CHEST - 2 VIEW COMPARISON:  12/01/2020 FINDINGS: Cardiac shadow is within normal limits. Pacing device is again seen and stable. Lungs are well aerated bilaterally without focal infiltrate or sizable effusion. No bony abnormality is noted. IMPRESSION: No acute abnormality seen. Electronically Signed   By: Inez Catalina M.D.   On: 12/29/2020 09:14    Procedures Procedures   Medications Ordered in ED Medications - No data to display  ED Course  I have reviewed the triage vital signs and the nursing notes.  Pertinent labs & imaging results that were available during my care of the patient were reviewed by me and considered in my medical decision making (see chart for details).    MDM Rules/Calculators/A&P                          Patient presents with mild atypical chest discomfort since Sunday.  Symptoms constant with differential including musculoskeletal, reflux, ACS, pericarditis, other.  Patient low risk for blood clot and with normal heart rate, no shortness of breath I do not feel D-dimer indicated.  With patient's age and persistent chest discomfort plan for troponin, with constant symptoms since Sunday single troponin needed.  Patient has cardiology follow-up.  Basic blood work reviewed normal hemoglobin, normal electrolytes, normal kidney function very reassuring.  Chest x-ray reviewed no acute  abnormalities, no broken wires pacemaker.   Patient is in constant communication with [facility] pacemaker. EKG reviewed no acute abnormalities no signs of STEMI. Discussed with cardiology who will arrange appointment and close follow-up in the clinic.  Patient stable for outpatient follow-up this time.  Data sent to [facility]. Final Clinical Impression(s) / ED Diagnoses Final diagnoses:  Acute chest pain    Rx / DC Orders ED Discharge Orders     None        Elnora Morrison, MD 12/29/20 1125

## 2020-12-30 ENCOUNTER — Telehealth (INDEPENDENT_AMBULATORY_CARE_PROVIDER_SITE_OTHER): Payer: Medicare Other | Admitting: Family Medicine

## 2020-12-30 ENCOUNTER — Encounter: Payer: Self-pay | Admitting: Family Medicine

## 2020-12-30 DIAGNOSIS — R0981 Nasal congestion: Secondary | ICD-10-CM | POA: Diagnosis not present

## 2020-12-30 DIAGNOSIS — R059 Cough, unspecified: Secondary | ICD-10-CM

## 2020-12-30 MED ORDER — BENZONATATE 100 MG PO CAPS: 100.0000 mg | ORAL_CAPSULE | Freq: Three times a day (TID) | ORAL | 0 refills | Status: DC | PRN

## 2020-12-30 NOTE — Patient Instructions (Signed)
  HOME CARE TIPS:  -[place] testing information: [email or web address] OR [phone] Most pharmacies also offer testing and home test kits. If the Covid19 test is positive, please make a prompt follow up visit with your primary care office or with [facility] to discuss treatment options. Treatments for Covid19 are best given early in the course of the illness.   -I sent the medication(s) we discussed to your pharmacy: Meds ordered this encounter  Medications   benzonatate (TESSALON PERLES) 100 MG capsule    Sig: Take 1 capsule (100 mg total) by mouth 3 (three) times daily as needed.    Dispense:  20 capsule    Refill:  0    -restart your flonase - 2 sprays each nostril daily  -can use nasal saline a few times per day if you have nasal congestion  -stay hydrated, drink plenty of fluids and eat small healthy meals - avoid dairy  -follow up with your doctor in 2-3 days unless improving and feeling better  -stay home while sick, except to seek medical care.  It was nice to meet you today, and I really hope you are feeling better soon. I help [facility] out with telemedicine visits on Tuesdays and Thursdays and am available for visits on those days. If you have any concerns or questions following this visit please schedule a follow up visit with your Primary Care doctor or seek care at a local urgent care clinic to avoid delays in care.    Seek in person care or schedule a follow up video visit promptly if your symptoms worsen, new concerns arise or you are not improving with treatment. Call 911 and/or seek emergency care if your symptoms are severe or life threatening.

## 2020-12-30 NOTE — Progress Notes (Signed)
Virtual Visit via Video Note  I connected with [name]  on 12/30/20 at  3:00 PM EDT by a video enabled telemedicine application and verified that I am speaking with the correct person using two identifiers.  Location patient: home, [place] Location provider:work or home office Persons participating in the virtual visit: patient, provider  I discussed the limitations of evaluation and management by telemedicine and the availability of in person appointments. The patient expressed understanding and agreed to proceed.   HPI:  Acute telemedicine visit for cough and congestion: -Onset: about 1 week ago - feels it is similar to issues he has had with his seasonal allergies in the past -Symptoms include: nasal congestion, stuffy nose, mucus is clear, sore throat, throat has  cough, chest tightness at times - but his alb helps this (has asthma) -was seen at the ER yesterday to check out his heart as reports had a pacemaker placed recently, reports he had CXR and reports was told his pacemaker, heart, etc was all ok -Denies:fever, SOB, CP, body aches, NVD, inability to eat/drink/get out of bed, known sick contacts -Has tried: zyrtec -Pertinent past medical history: see below -Pertinent medication allergies:  Allergies  Allergen Reactions   Mercaptopurine Other (See Comments)    Pancreatitis    Shellfish Allergy Anaphylaxis    Throat started to close   Humira [Adalimumab]     Developed antibodies   Wound Dressing Adhesive Rash  -COVID-19 vaccine status: vaccinated and boosted  ROS: See pertinent positives and negatives per HPI.  Past Medical History:  Diagnosis Date   Acute prostatitis 07/24/2007   Qualifier: Diagnosis of  By: [name] MD, [name]    Allergy    mild   Arthritis    osteoarthritis   Asthma    Blood transfusion without reported diagnosis    BPH (benign prostatic hypertrophy) with urinary obstruction    Crohn's ileitis ([place]) suspected 05/03/2017   Dilated aortic root ([place])     noted on echo 08/2012   Diverticulitis of colon    EPIDIDYMITIS 02/15/2010   Qualifier: Diagnosis of  By: [name] MD, [name]    GERD (gastroesophageal reflux disease)    H/O: GI bleed    Hemorrhoids    Hepatitis 1975   unknown type    HERPES SIMPLEX INFECTION 10/14/2007   Qualifier: Diagnosis of  By: [name] MD, [name]    Hiatal hernia    Ileus following gastrointestinal surgery ([place]) 12/26/2011   Long term (current) use of systemic steroids 06/18/2018   Psoriasis    sees [name] at [facility].   Recurrent ventral incisional hernia 05/10/2012   SVT (supraventricular tachycardia) ([place])    Ulcer 08/21/2013   ileal    Past Surgical History:  Procedure Laterality Date   BOWEL RESECTION  12/19/2011   Procedure: SMALL BOWEL RESECTION;  Surgeon: [name], MD;  Location: [facility];  Service: General;  Laterality: N/A;  with anastamosis and insertion mesh   BRONCHOSCOPY     COLON SURGERY  01/2004   x 2   COLONOSCOPY W/ BIOPSIES  04/26/2017   per [name], no polyps, benign inflammation, repeat in 5 yrs    CYSTOSCOPY     ESOPHAGOGASTRODUODENOSCOPY     HEMICOLECTOMY     left side, at [facility], diverticulitis   [place]     [phone] incisional hernia   ILEOSTOMY     ILEOSTOMY CLOSURE     INSERTION OF MESH  07/31/2012   Procedure: INSERTION OF MESH;  Surgeon: [name], MD;  Location: [facility];  Service: General;;   LAPAROTOMY  12/19/2011   Procedure: EXPLORATORY LAPAROTOMY;  Surgeon: [name], MD;  Location: [facility];  Service: General;  Laterality: N/A;   PACEMAKER IMPLANT N/A 12/01/2020   Procedure: PACEMAKER IMPLANT;  Surgeon: [name], MD;  Location: [place] [facility];  Service: Cardiovascular;  Laterality: N/A;   PACEMAKER INSERTION Left    TONSILLECTOMY     UPPER GASTROINTESTINAL ENDOSCOPY     VASECTOMY     VENTRAL HERNIA REPAIR  07/31/2012   Procedure: HERNIA REPAIR VENTRAL ADULT;  Surgeon: [name], MD;  Location: [facility];  Service: General;  Laterality: N/A;     Current Outpatient Medications:    albuterol (PROVENTIL HFA;VENTOLIN HFA) 108 (90 Base) MCG/ACT inhaler, INHALE 2 PUFFS INTO THE LUNGS EVERY 4 (FOUR) HOURS AS NEEDED FOR WHEEZING OR SHORTNESS OF BREATH., Disp: 8.5 Inhaler, Rfl: 2   benzonatate (TESSALON PERLES) 100 MG capsule, Take 1 capsule (100 mg total) by mouth 3 (three) times daily as needed., Disp: 20 capsule, Rfl: 0   diphenoxylate-atropine (LOMOTIL) 2.5-0.025 MG tablet, 1-2 tablets 4 times a day as neded (Patient taking differently: Take 1-2 tablets by mouth 4 (four) times daily as needed for diarrhea or loose stools. 1-2 tablets 4 times a day as neded), Disp: 90 tablet, Rfl: 1   EPINEPHrine (EPIPEN 2-PAK) 0.3 mg/0.3 mL IJ SOAJ injection, Inject 0.3 mLs (0.3 mg total) into the muscle as needed for anaphylaxis., Disp: 1 Device, Rfl: 0   esomeprazole (NEXIUM) 20 MG capsule, Take 20 mg by mouth daily as needed (acid reflux)., Disp: , Rfl:    fluticasone (CUTIVATE) 0.05 % cream, Apply 1 application topically 2 (two) times daily as needed for irritation., Disp: , Rfl:    halobetasol (ULTRAVATE) 0.05 % ointment, Apply topically 2 (two) times daily. (Patient taking differently: Apply 1 application topically 2 (two) times daily as needed (irritation).), Disp: 50 g, Rfl: 5   hyoscyamine (LEVSIN SL) 0.125 MG SL tablet, PLACE 1 TABLET UNDER THE TONGUE EVERY 4 HOURS AS NEEDED FOR CRAMPING (URGENT DEFECATION) (Patient taking differently: Take 0.125 mg by mouth every 4 (four) hours as needed for cramping.), Disp: 60 tablet, Rfl: 2   ketoconazole (NIZORAL) 2 % cream, APPLY TWICE A DAY AS NEEDED FOR FUNGAL INFECTION (Patient taking differently: Apply 1 application topically 2 (two) times daily as needed for irritation. APPLY TWICE A DAY AS NEEDED FOR FUNGAL INFECTION), Disp: 30 g, Rfl: 5   losartan (COZAAR) 25 MG tablet, Take 1 tablet (25 mg total) by mouth daily., Disp: 90 tablet, Rfl:  3   Multiple Vitamins-Minerals (MULTIVITAMIN WITH MINERALS) tablet, Take 1 tablet by mouth daily., Disp: , Rfl:    pramoxine-hydrocortisone (ANALPRAM-HC) 1-1 % rectal cream, Place 1 application rectally 2 (two) times daily. Use twice daily before SITZ baths, Disp: 30 g, Rfl: 0   tadalafil (CIALIS) 5 MG tablet, Take 1 tablet (5 mg total) by mouth daily. (Patient taking differently: Take 5 mg by mouth daily as needed for erectile dysfunction.), Disp: 90 tablet, Rfl: 3   traZODone (DESYREL) 100 MG tablet, TAKE ONE HALF TABLETS (50 MG TOTAL) BY MOUTH AT BEDTIME. (Patient taking differently: Take 50 mg by mouth at bedtime.), Disp: 45 tablet, Rfl: 11   Turmeric 500 MG CAPS, Take 500 mg by mouth daily., Disp: , Rfl:    vedolizumab (ENTYVIO) 300 MG injection, INFUSE 300  MG IV AT WEEK 6, THEN EVERY 8 WEEKS THEREAFTER. (Patient taking differently: Inject 300 mg into the vein every 8 (eight) weeks. INFUSE 300 MG IV AT WEEK 6, THEN EVERY 8 WEEKS THEREAFTER.), Disp: 1 each, Rfl: 6  EXAM:  VITALS per patient if applicable:  GENERAL: alert, oriented, appears well and in no acute distress  HEENT: atraumatic, conjunttiva clear, no obvious abnormalities on inspection of external nose and ears  NECK: normal movements of the head and neck  LUNGS: on inspection no signs of respiratory distress, breathing rate appears normal, no obvious gross SOB, gasping or wheezing  CV: no obvious cyanosis  MS: moves all visible extremities without noticeable abnormality  PSYCH/NEURO: pleasant and cooperative, no obvious depression or anxiety, speech and thought processing grossly intact  ASSESSMENT AND PLAN:  Discussed the following assessment and plan:  Nasal congestion  Cough  -we discussed possible serious and likely etiologies, options for evaluation and workup, limitations of telemedicine visit vs in person visit, treatment, treatment risks and precautions. Pt prefers to treat via telemedicine empirically rather  than in person at this moment. Suspect possible VURI, covid19 vs other. Reviewed CXR report - negative. Suprisingly, he has not had a covid test yet and he was not tested in the ER yesterday. He has opted for tessalon for cough, adding back his flonase, nasal saline, alb prn and covid testing. Advised follow up with PCP or seeking prompt inperson care if positive test, worsening, new symptoms arise, or if is not improving with treatment. Advised low threshhold to see inperson care given his comorbidities. Discussed options for inperson care if PCP office not available. Did let this patient know that I only do telemedicine on Tuesdays and Thursdays for [place]. Advised to schedule follow up visit with PCP or [facility] if any further questions or concerns to avoid delays in care.   I discussed the assessment and treatment plan with the patient. The patient was provided an opportunity to ask questions and all were answered. The patient agreed with the plan and demonstrated an understanding of the instructions.     Lucretia Kern, DO

## 2021-01-03 ENCOUNTER — Telehealth: Payer: Self-pay | Admitting: Internal Medicine

## 2021-01-03 NOTE — Telephone Encounter (Signed)
Pt scheduled to see Dr. Havery Moros tomorrow morning at 8:10am. Pt aware of appt.

## 2021-01-03 NOTE — Telephone Encounter (Signed)
Pt having crohn's flare up since last Thursday. He would like to be seen asap. Pls call him.

## 2021-01-03 NOTE — Telephone Encounter (Signed)
Pt states he had a stomach bug that started Thursday. He now feels he is having a Crohns flare. States he is having abd cramping with nausea and diarrhea. Having 3-4 diarrhea stools/day, no blood. He has levsin and has taken it for the cramping. He had his last dose of Entyvio last week. Please advise.

## 2021-01-03 NOTE — Telephone Encounter (Signed)
It looks like Dr. Loletha Carrow and Havery Moros both have an early morning appointment open tomorrow and I would appreciate it if one of them would see him since I am the hospital doc

## 2021-01-03 NOTE — Telephone Encounter (Signed)
Got it, will see him and take care of it.

## 2021-01-04 ENCOUNTER — Other Ambulatory Visit (INDEPENDENT_AMBULATORY_CARE_PROVIDER_SITE_OTHER): Payer: Medicare Other

## 2021-01-04 ENCOUNTER — Encounter: Payer: Self-pay | Admitting: Gastroenterology

## 2021-01-04 ENCOUNTER — Ambulatory Visit (INDEPENDENT_AMBULATORY_CARE_PROVIDER_SITE_OTHER)
Admission: RE | Admit: 2021-01-04 | Discharge: 2021-01-04 | Disposition: A | Discharge status: Home / Self Care | Payer: Medicare Other | Source: Ambulatory Visit | Attending: Gastroenterology | Admitting: Gastroenterology

## 2021-01-04 ENCOUNTER — Other Ambulatory Visit: Payer: Self-pay

## 2021-01-04 ENCOUNTER — Ambulatory Visit (INDEPENDENT_AMBULATORY_CARE_PROVIDER_SITE_OTHER): Payer: Medicare Other | Admitting: Gastroenterology

## 2021-01-04 VITALS — BP 110/64 | HR 64 | Ht 70.0 in | Wt 204.0 lb

## 2021-01-04 DIAGNOSIS — R1033 Periumbilical pain: Secondary | ICD-10-CM

## 2021-01-04 DIAGNOSIS — K50018 Crohn's disease of small intestine with other complication: Secondary | ICD-10-CM | POA: Diagnosis not present

## 2021-01-04 DIAGNOSIS — R194 Change in bowel habit: Secondary | ICD-10-CM

## 2021-01-04 LAB — CBC WITH DIFFERENTIAL/PLATELET
Basophils Absolute: 0 10*3/uL (ref 0.0–0.1)
Basophils Relative: 0.3 % (ref 0.0–3.0)
Eosinophils Absolute: 0.2 10*3/uL (ref 0.0–0.7)
Eosinophils Relative: 1.9 % (ref 0.0–5.0)
HCT: 45 % (ref 39.0–52.0)
Hemoglobin: 15.2 g/dL (ref 13.0–17.0)
Lymphocytes Relative: 25.7 % (ref 12.0–46.0)
Lymphs Abs: 2.7 10*3/uL (ref 0.7–4.0)
MCHC: 33.9 g/dL (ref 30.0–36.0)
MCV: 87.7 fl (ref 78.0–100.0)
Monocytes Absolute: 0.7 10*3/uL (ref 0.1–1.0)
Monocytes Relative: 6.7 % (ref 3.0–12.0)
Neutro Abs: 7 10*3/uL (ref 1.4–7.7)
Neutrophils Relative %: 65.4 % (ref 43.0–77.0)
Platelets: 221 10*3/uL (ref 150.0–400.0)
RBC: 5.13 Mil/uL (ref 4.22–5.81)
RDW: 13.4 % (ref 11.5–15.5)
WBC: 10.7 10*3/uL — ABNORMAL HIGH (ref 4.0–10.5)

## 2021-01-04 LAB — COMPREHENSIVE METABOLIC PANEL
ALT: 14 U/L (ref 0–53)
AST: 22 U/L (ref 0–37)
Albumin: 4.4 g/dL (ref 3.5–5.2)
Alkaline Phosphatase: 54 U/L (ref 39–117)
BUN: 14 mg/dL (ref 6–23)
CO2: 26 mEq/L (ref 19–32)
Calcium: 9.2 mg/dL (ref 8.4–10.5)
Chloride: 105 mEq/L (ref 96–112)
Creatinine, Ser: 1.11 mg/dL (ref 0.40–1.50)
GFR: 68.48 mL/min (ref >60.00)
Glucose, Bld: 90 mg/dL (ref 70–99)
Potassium: 3.8 mEq/L (ref 3.5–5.1)
Sodium: 140 mEq/L (ref 135–145)
Total Bilirubin: 0.6 mg/dL (ref 0.2–1.2)
Total Protein: 7.1 g/dL (ref 6.0–8.3)

## 2021-01-04 LAB — C-REACTIVE PROTEIN: CRP: <1 mg/dL (ref 0.5–20.0)

## 2021-01-04 MED ORDER — POLYETHYLENE GLYCOL 3350 17 G PO PACK: 17.0000 g | PACK | Freq: Every day | ORAL | 0 refills | Status: DC

## 2021-01-04 MED ORDER — BUDESONIDE 3 MG PO CPEP: 9.0000 mg | ORAL_CAPSULE | Freq: Every day | ORAL | 0 refills | Status: DC

## 2021-01-04 NOTE — Progress Notes (Signed)
HPI :  70 year old male with a history of ileal Crohn's disease, here for an acute follow-up visit for abdominal pain.  He normally follows with [name] and has done so for years, I am helping to see him today as [name] is covering the hospital.  We briefly reviewed his Crohn's history.  He had been on Humira previously, developed antibodies and was taken off that and switched to Stelara.  He states Stelara did not provide any benefit and it appears he was switched to [facility] in the fall 2020.  He states Weyman Rodney has helped him more than anything in general he had been doing pretty well on this regimen.  Historically it sounds like he has ileal Crohn's disease.  He denies any history of obstructions/or perforating disease associated with this.  He mainly has loose stools and abdominal pain when his Crohn's flares.  He states he has not had any significant flares of Crohn's disease since has been on Entyvio.  About a week and a half ago he states his wife came down with a "viral gastroenteritis", symptoms of nausea vomiting and constipation interestingly, no diarrhea.  He states a few days later he developed the exact same symptoms with nausea vomiting and became constipated which is very unusual for him.  However he states he has also developed some abdominal pain and while his nausea and vomiting have improved, he remains constipated.  He states he normally has some twinges of pain periodically from his Crohn's disease but normally does not have much.  He states he has some tenderness in his mid abdomen around the periumbilical area.  He has been using Levsin which helps somewhat.  Pain seems to worsen after he eats.  He has stopped having nausea and vomiting but has been on a bland diet and has poor appetite.  He reports he normally goes to 3 times per day in regards to bowel habits but has not really had much bowel movement since last Thursday.  He took a dose of a laxative over the weekend and  had significant output but has not gone since that time.  He states this feels a bit different from his prior Crohn's symptoms.  He reminds me that he had diverticulitis in 2005 which led to surgical resection which was complicated and he had a ileostomy for a period of time and then a takedown.  He has had incisional hernia in 2006 that was repaired and then had a incarcerated hernia in 2013 where he was admitted with sepsis and was quite ill.  Again he denies any bowel resections for his Crohn's disease.  He was seen in the ED last week for chest pains, he was concerned that his pacemaker was not functioning however states the chest pains went away and his pacemaker is doing well now.  He is passing gas but not much stool.  He has some level of tenderness in his mid abdomen all the time but has 10 to 30 seconds severe cramps that can be rated upwards of 7 out of 10.  He states this is the worst he has felt in his abdomen over the past few years  Last colonoscopy was in 2018 at which time he had some mild ileitis.  Last cross-sectional imaging was February 2020 without any evidence of active bowel inflammation.    Colonoscopy 04/26/2017 -  - Patchy inflammation characterized by serpentine ulcerations and shallow ulcerations was found in the terminal ileum. Biopsies were taken with a  cold forceps for histology. Verification of patient identification for the specimen was done. Estimated blood loss was minimal. - Scattered diverticula were found in the right colon. - The exam was otherwise without abnormality on direct and retroflexion views. - Biopsies for histology were taken with a cold forceps from the right colon and left colon for evaluation of microscopic colitis.   1. Surgical [P], terminal ileum ulceration of intestines - ULCERATION. - NO GRANULOMAS. - NO DYSPLASIA OR MALIGNANCY. 2. Surgical [P], random sites - BENIGN COLONIC MUCOSA. - NO ACTIVE INFLAMMATION OR EVIDENCE OF MICROSCOPIC  COLITIS. - NO GRANULOMAS. - NO DYSPLASIA OR MALIGNANCY.   CT abdomen / pelvis 09/06/18 -  IMPRESSION: Evidence of prior bowel surgery. There is no evidence of bowel obstruction or bowel inflammation on current exam. Mild fatty infiltration of liver. No acute abnormality identified in the abdomen and pelvis.     Past Medical History:  Diagnosis Date   Acute prostatitis 07/24/2007   Qualifier: Diagnosis of  By: [name] MD, [name]    Allergy    mild   Arthritis    osteoarthritis   Asthma    Blood transfusion without reported diagnosis    BPH (benign prostatic hypertrophy) with urinary obstruction    Crohn's ileitis ([place]) suspected 05/03/2017   Dilated aortic root ([place])    noted on echo 08/2012   Diverticulitis of colon    EPIDIDYMITIS 02/15/2010   Qualifier: Diagnosis of  By: [name] MD, [name]    GERD (gastroesophageal reflux disease)    H/O: GI bleed    Hemorrhoids    Hepatitis 1975   unknown type    HERPES SIMPLEX INFECTION 10/14/2007   Qualifier: Diagnosis of  By: [name] MD, [name]    Hiatal hernia    Ileus following gastrointestinal surgery ([place]) 12/26/2011   Long term (current) use of systemic steroids 06/18/2018   Psoriasis    sees [name] at [facility].   Recurrent ventral incisional hernia 05/10/2012   SVT (supraventricular tachycardia) ([place])    Ulcer 08/21/2013   ileal     Past Surgical History:  Procedure Laterality Date   BOWEL RESECTION  12/19/2011   Procedure: SMALL BOWEL RESECTION;  Surgeon: [name], MD;  Location: [facility];  Service: General;  Laterality: N/A;  with anastamosis and insertion mesh   BRONCHOSCOPY     COLON SURGERY  01/2004   x 2   COLONOSCOPY W/ BIOPSIES  04/26/2017   per [name], no polyps, benign inflammation, repeat in 5 yrs    CYSTOSCOPY     ESOPHAGOGASTRODUODENOSCOPY     HEMICOLECTOMY     left side, at [facility], diverticulitis   [place]     [phone] incisional hernia   ILEOSTOMY      ILEOSTOMY CLOSURE     INSERTION OF MESH  07/31/2012   Procedure: INSERTION OF MESH;  Surgeon: [name], MD;  Location: [facility];  Service: General;;   LAPAROTOMY  12/19/2011   Procedure: EXPLORATORY LAPAROTOMY;  Surgeon: [name], MD;  Location: [facility];  Service: General;  Laterality: N/A;   PACEMAKER IMPLANT N/A 12/01/2020   Procedure: PACEMAKER IMPLANT;  Surgeon: [name], MD;  Location: [facility];  Service: Cardiovascular;  Laterality: N/A;   PACEMAKER INSERTION Left    TONSILLECTOMY     UPPER GASTROINTESTINAL ENDOSCOPY     VASECTOMY     VENTRAL HERNIA REPAIR  07/31/2012  Procedure: HERNIA REPAIR VENTRAL ADULT;  Surgeon: [name], MD;  Location: [facility];  Service: General;  Laterality: N/A;   Family History  Problem Relation Age of Onset   Lung cancer Mother    Leukemia Father    Hypertension Father    Heart disease Father    Heart attack Father    Prostate cancer Father    Prostate cancer Paternal Uncle    Colon cancer Neg Hx    Stomach cancer Neg Hx    Colon polyps Neg Hx    Esophageal cancer Neg Hx    Rectal cancer Neg Hx    Social History   Tobacco Use   Smoking status: Never   Smokeless tobacco: Never  Vaping Use   Vaping Use: Never used  Substance Use Topics   Alcohol use: Yes    Alcohol/week: 0.0 standard drinks    Comment: occ   Drug use: No   Current Outpatient Medications  Medication Sig Dispense Refill   albuterol (PROVENTIL HFA;VENTOLIN HFA) 108 (90 Base) MCG/ACT inhaler INHALE 2 PUFFS INTO THE LUNGS EVERY 4 (FOUR) HOURS AS NEEDED FOR WHEEZING OR SHORTNESS OF BREATH. 8.5 Inhaler 2   benzonatate (TESSALON PERLES) 100 MG capsule Take 1 capsule (100 mg total) by mouth 3 (three) times daily as needed. 20 capsule 0   diphenoxylate-atropine (LOMOTIL) 2.5-0.025 MG tablet 1-2 tablets 4 times a day as neded (Patient taking differently: Take 1-2 tablets by mouth 4 (four) times daily as needed for diarrhea or  loose stools. 1-2 tablets 4 times a day as neded) 90 tablet 1   EPINEPHrine (EPIPEN 2-PAK) 0.3 mg/0.3 mL IJ SOAJ injection Inject 0.3 mLs (0.3 mg total) into the muscle as needed for anaphylaxis. 1 Device 0   esomeprazole (NEXIUM) 20 MG capsule Take 20 mg by mouth daily as needed (acid reflux).     fluticasone (CUTIVATE) 0.05 % cream Apply 1 application topically 2 (two) times daily as needed for irritation.     halobetasol (ULTRAVATE) 0.05 % ointment Apply topically 2 (two) times daily. (Patient taking differently: Apply 1 application topically 2 (two) times daily as needed (irritation).) 50 g 5   hyoscyamine (LEVSIN SL) 0.125 MG SL tablet PLACE 1 TABLET UNDER THE TONGUE EVERY 4 HOURS AS NEEDED FOR CRAMPING (URGENT DEFECATION) (Patient taking differently: Take 0.125 mg by mouth every 4 (four) hours as needed for cramping.) 60 tablet 2   ketoconazole (NIZORAL) 2 % cream APPLY TWICE A DAY AS NEEDED FOR FUNGAL INFECTION (Patient taking differently: Apply 1 application topically 2 (two) times daily as needed for irritation. APPLY TWICE A DAY AS NEEDED FOR FUNGAL INFECTION) 30 g 5   losartan (COZAAR) 25 MG tablet Take 1 tablet (25 mg total) by mouth daily. 90 tablet 3   Multiple Vitamins-Minerals (MULTIVITAMIN WITH MINERALS) tablet Take 1 tablet by mouth daily.     pramoxine-hydrocortisone (ANALPRAM-HC) 1-1 % rectal cream Place 1 application rectally 2 (two) times daily. Use twice daily before SITZ baths 30 g 0   tadalafil (CIALIS) 5 MG tablet Take 1 tablet (5 mg total) by mouth daily. (Patient taking differently: Take 5 mg by mouth daily as needed for erectile dysfunction.) 90 tablet 3   traZODone (DESYREL) 100 MG tablet TAKE ONE HALF TABLETS (50 MG TOTAL) BY MOUTH AT BEDTIME. (Patient taking differently: Take 50 mg by mouth at bedtime.) 45 tablet 11   Turmeric 500 MG CAPS Take 500 mg by mouth daily.     vedolizumab (ENTYVIO) 300 MG  injection INFUSE 300 MG IV AT WEEK 6, THEN EVERY 8 WEEKS THEREAFTER.  (Patient taking differently: Inject 300 mg into the vein every 8 (eight) weeks. INFUSE 300 MG IV AT WEEK 6, THEN EVERY 8 WEEKS THEREAFTER.) 1 each 6   No current facility-administered medications for this visit.   Allergies  Allergen Reactions   Mercaptopurine Other (See Comments)    Pancreatitis    Shellfish Allergy Anaphylaxis    Throat started to close   Humira [Adalimumab]     Developed antibodies   Wound Dressing Adhesive Rash     Review of Systems: All systems reviewed and negative except where noted in HPI.    DG Chest 2 View  Result Date: 12/29/2020 CLINICAL DATA:  Chest pain for several days EXAM: CHEST - 2 VIEW COMPARISON:  12/01/2020 FINDINGS: Cardiac shadow is within normal limits. Pacing device is again seen and stable. Lungs are well aerated bilaterally without focal infiltrate or sizable effusion. No bony abnormality is noted. IMPRESSION: No acute abnormality seen. Electronically Signed   By: Inez Catalina M.D.   On: 12/29/2020 09:14   CUP PACEART INCLINIC DEVICE CHECK  Result Date: 12/14/2020 Wound check appointment. Steri-strips removed. Wound without redness or edema. Incision edges approximated, wound well healed. Normal device function. Thresholds, sensing, and impedances consistent with implant measurements. Device programmed at 3.5V/auto capture programmed on for extra safety margin until 3 month visit. Histogram distribution appropriate for patient and level of activity. AT/AF burden < 1%, 1 AMS episode with EGM that showed AF with controlled v-rate that was 12 seconds duration. No  high ventricular rates noted. Patient educated about wound care, arm mobility, lifting restrictions. ROV with Dr Curt Bears 03/07/21. Enrolled in remote follow-up and next remote 03/04/21. Wound check appointment. Steri-strips removed. Wound without redness or edema. Incision edges approximated, wound well healed. Normal device function. Thresholds, sensing, and impedances consistent with  implant measurements. Device programmed at 3.5V/auto capture programmed on for extra safety margin until 3 month visit. Histogram distribution appropriate for patient and level of activity. AT/AF burden < 1%, 1 AMS episode with EGM that showed AF with controlled v-rate that was 12 seconds duration. No  high ventricular rates noted. Patient educated about wound care, arm mobility, lifting restrictions. ROV with Dr Curt Bears 03/07/21. Enrolled in remote follow-up and next remote 03/04/21.   Physical Exam: BP 110/64   Pulse 64   Ht 5' 10"  (1.778 m)   Wt 204 lb (92.5 kg)   BMI 29.27 kg/m  Constitutional: Pleasant,well-developed, male in no acute distress. HEENT: Normocephalic and atraumatic. Conjunctivae are normal. No scleral icterus. Neck supple.  Cardiovascular: Normal rate, regular rhythm.  Pulmonary/chest: Effort normal and breath sounds normal. No wheezing, rales or rhonchi. Abdominal: Soft, nondistended,periumbilical tenderness, There are no masses palpable.  Extremities: no edema Lymphadenopathy: No cervical adenopathy noted. Neurological: Alert and oriented to person place and time. Skin: Skin is warm and dry. No rashes noted. Psychiatric: Normal mood and affect. Behavior is normal.   ASSESSMENT AND PLAN: 70 year old male here for reassessment of the following:  Periumbilical abdominal pain Altered bowel habits Crohn's disease  As above, ileal Crohn's disease well controlled with Entyvio for the past few years, presenting with a few days worth of periumbilical abdominal pain, nausea, and change in bowels  To him this feels a bit different from his typical Crohn's symptoms and he states he has not had pain like this for some time.  Recent sick contact with wife who had similar symptoms but  his symptoms have persisted.  We discussed differential which includes Crohn's flare versus postinfectious functional change, versus partial obstructive symptoms (history of multiple hernias, one with  incarceration), recurrent diverticulitis, etc.  We discussed options.  I will check an abdominal x-ray today to ensure no overt / partial obstruction.  We will check basic labs today as well to make sure stable.  He has been on budesonide and steroids in the past for Crohn's flares, I offered him budesonide if he thinks he feels like this is his Crohn's disease and he wants to wait for his labs and x-ray first.  He can continue Levsin and I will start MiraLAX for constipation.  I recommend he stay on a low residual diet.  Ultimately I think he warrants a CT scan to reassess his Crohn's disease now that he has been on Entyvio for a while in light of the symptoms and to ensure nothing else is going on given his complicated surgical history.  We have ordered a CT enterography which should be done in the next few days.  If he has vomiting, worsening pain, etc. in the interim he will contact me.  I will contact him with results of x-ray and labs later today and will discuss if he wants to start budesonide in the interim.  If this turns out to be a true Crohn's flare would recommend checking Entyvio levels at his next infusion, his last dose was just 2 weeks ago.  Plan: - labs today - CBC, CRP, CMET - abdominal xray - rule out obstruction - continue levsin PRN if that helps - start Miralax for constipation - low residual diet - CT scan abdomen / pelvis - enterography - protocol - may need Entyvio levels checked if truly a Crohn's flare, may also consider surveillance colonoscopy when he is feeling better  - I will await labs / xray this AM and contact him about starting budesonide   Jolly Mango, MD [facility]

## 2021-01-04 NOTE — Patient Instructions (Signed)
f you are age 68 or older, your body mass index should be between 23-30. Your Body mass index is 29.27 kg/m. If this is out of the aforementioned range listed, please consider follow up with your Primary Care Provider.  If you are age 65 or younger, your body mass index should be between 19-25. Your Body mass index is 29.27 kg/m. If this is out of the aformentioned range listed, please consider follow up with your Primary Care Provider.   ______________________________________________________  The [facility] providers would like to encourage you to use [facility] to communicate with providers for non-urgent requests or questions.  Due to long hold times on the telephone, sending your provider a message by [facility] may be a faster and more efficient way to get a response.  Please allow 48 business hours for a response.  Please remember that this is for non-urgent requests.  ___________________________________________________________________________________________  Please go to the basement of your building for an abdominal Xray.  When the doors open Radiology is straight ahead.  Please go to the lab in the basement of our building to have lab work done as you leave today. Hit "B" for basement when you get on the elevator.  When the doors open the lab is on your left.  We will call you with the results. Thank you.  _____________________________________________________________________________  [name] have been scheduled for a CT scan of the abdomen and pelvis at [facility], 1st floor Radiology. You are scheduled on Thursday, 6-23  at 8:30am. You should arrive 15 minutes prior to your appointment time for registration.  The solution may taste better if refrigerated, but do NOT add ice or any other liquid to this solution. Shake well before drinking.   Please follow the written instructions below on the day of your exam:   1) Do not eat anything after 4:30am (4 hours prior to your test)   2)  Drink 1 bottle of contrast @ 6:30am (2 hours prior to your exam)  Remember to shake well before drinking and do NOT pour over ice.     Drink 1 bottle of contrast @ 7:30am (1 hour prior to your exam)   You may take any medications as prescribed with a small amount of water, if necessary. If you take any of the following medications: METFORMIN, GLUCOPHAGE, GLUCOVANCE, AVANDAMET, RIOMET, FORTAMET, [place] MET, JANUMET, GLUMETZA or METAGLIP, you MAY be asked to HOLD this medication 48 hours AFTER the exam.   The purpose of you drinking the oral contrast is to aid in the visualization of your intestinal tract. The contrast solution may cause some diarrhea. Depending on your individual set of symptoms, you may also receive an intravenous injection of x-ray contrast/dye. Plan on being at [facility] for 45 minutes or longer, depending on the type of exam you are having performed.   If you have any questions regarding your exam or if you need to reschedule, you may call [facility] at [phone] between the hours of 8:00 am and 5:00 pm, Monday-Friday.   ___________________________________________________________________________  Please purchase the following medications over the counter and take as directed: Miralax - use once daily  Continue Levsin and a low residue diet.  Thank you for entrusting me with your care and for choosing [facility], Dr. Milton Cellar

## 2021-01-06 ENCOUNTER — Other Ambulatory Visit: Payer: Self-pay

## 2021-01-06 ENCOUNTER — Ambulatory Visit (HOSPITAL_COMMUNITY)
Admission: RE | Admit: 2021-01-06 | Discharge: 2021-01-06 | Disposition: A | Discharge status: Home / Self Care | Payer: Medicare Other | Source: Ambulatory Visit | Attending: Gastroenterology | Admitting: Gastroenterology

## 2021-01-06 DIAGNOSIS — K50018 Crohn's disease of small intestine with other complication: Secondary | ICD-10-CM

## 2021-01-06 DIAGNOSIS — R194 Change in bowel habit: Secondary | ICD-10-CM | POA: Diagnosis present

## 2021-01-06 DIAGNOSIS — R1033 Periumbilical pain: Secondary | ICD-10-CM

## 2021-01-06 NOTE — Progress Notes (Signed)
Electrophysiology Office Note Date: 01/06/2021  ID:  [name], DOB [birth year], MRN [id number]  PCP: [name], MD Primary Cardiologist: None Electrophysiologist: Will Meredith Leeds, MD   CC: Pacemaker follow-up  [name] is a 70 y.o. male seen today for Will Meredith Leeds, MD for post hospital follow up.    Patient seen in ED 6/15 with atypical chest pain. Work up unrevealing. CXR with stable device placement. HS trop negative.   Since discharge from hospital the patient reports doing very well. Prior to his ED visit, his wife had GI illness. After his ED visit, he had a Crohn's flair with subsequent resolution of his discomfort. He now attributes this to his GI illness. He reports being "nervous and concerned" and wanted to make sure his pacemaker was functioning normally. he denies further chest pain, palpitations, dyspnea, PND, orthopnea, nausea, vomiting, dizziness, syncope, edema, weight gain, or early satiety.  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  Past Medical History:  Diagnosis Date   Acute prostatitis 07/24/2007   Qualifier: Diagnosis of  By: [name] MD, [name]    Allergy    mild   Arthritis    osteoarthritis   Asthma    Blood transfusion without reported diagnosis    BPH (benign prostatic hypertrophy) with urinary obstruction    Crohn's ileitis ([place]) suspected 05/03/2017   Dilated aortic root ([place])    noted on echo 08/2012   Diverticulitis of colon    EPIDIDYMITIS 02/15/2010   Qualifier: Diagnosis of  By: [name] MD, [name]    GERD (gastroesophageal reflux disease)    H/O: GI bleed    Hemorrhoids    Hepatitis 1975   unknown type    HERPES SIMPLEX INFECTION 10/14/2007   Qualifier: Diagnosis of  By: [name] MD, [name]    Hiatal hernia    Ileus following gastrointestinal surgery ([place]) 12/26/2011   Long term (current) use of systemic steroids 06/18/2018   Psoriasis    se[facility] [name] at  [facility].   Recurrent ventral incisional hernia 05/10/2012   SVT (supraventricular tachycardia) ([place])    Ulcer 08/21/2013   ileal   Past Surgical History:  Procedure Laterality Date   BOWEL RESECTION  12/19/2011   Procedure: SMALL BOWEL RESECTION;  Surgeon: [name], MD;  [facility];  Service: General;  Laterality: N/A;  with anastamosis and insertion mesh   BRONCHOSCOPY     COLON SURGERY  01/2004   x 2   COLONOSCOPY W/ BIOPSIES  04/26/2017   per [name], no polyps, benign inflammation, repeat in 5 yrs    CYSTOSCOPY     ESOPHAGOGASTRODUODENOSCOPY     HEMICOLECTOMY     left side, at [facility], diverticulitis   [place]     [phone] incisional hernia   ILEOSTOMY     ILEOSTOMY CLOSURE     INSERTION OF MESH  07/31/2012   Procedure: INSERTION OF MESH;  Surgeon: [name], MD;  [facility];  Service: General;;   LAPAROTOMY  12/19/2011   Procedure: EXPLORATORY LAPAROTOMY;  Surgeon: [name], MD;  [facility];  Service: General;  Laterality: N/A;   PACEMAKER IMPLANT N/A 12/01/2020   Procedure: PACEMAKER IMPLANT;  Surgeon: [name], MD;  Location: [place] [facility];  Service: Cardiovascular;  Laterality: N/A;   PACEMAKER INSERTION Left    TONSILLECTOMY  UPPER GASTROINTESTINAL ENDOSCOPY     VASECTOMY     VENTRAL HERNIA REPAIR  07/31/2012   Procedure: HERNIA REPAIR VENTRAL ADULT;  Surgeon: [name], MD;  [facility];  Service: General;  Laterality: N/A;    Current Outpatient Medications  Medication Sig Dispense Refill   albuterol (PROVENTIL HFA;VENTOLIN HFA) 108 (90 Base) MCG/ACT inhaler INHALE 2 PUFFS INTO THE LUNGS EVERY 4 (FOUR) HOURS AS NEEDED FOR WHEEZING OR SHORTNESS OF BREATH. 8.5 Inhaler 2   benzonatate (TESSALON PERLES) 100 MG capsule Take 1 capsule (100 mg total) by mouth 3 (three) times daily as needed. 20 capsule 0   budesonide (ENTOCORT EC) 3 MG 24 hr capsule Take  3 capsules (9 mg total) by mouth daily. 90 capsule 0   diphenoxylate-atropine (LOMOTIL) 2.5-0.025 MG tablet 1-2 tablets 4 times a day as neded (Patient taking differently: Take 1-2 tablets by mouth 4 (four) times daily as needed for diarrhea or loose stools. 1-2 tablets 4 times a day as neded) 90 tablet 1   EPINEPHrine (EPIPEN 2-PAK) 0.3 mg/0.3 mL IJ SOAJ injection Inject 0.3 mLs (0.3 mg total) into the muscle as needed for anaphylaxis. 1 Device 0   esomeprazole (NEXIUM) 20 MG capsule Take 20 mg by mouth daily as needed (acid reflux).     fluticasone (CUTIVATE) 0.05 % cream Apply 1 application topically 2 (two) times daily as needed for irritation.     halobetasol (ULTRAVATE) 0.05 % ointment Apply topically 2 (two) times daily. (Patient taking differently: Apply 1 application topically 2 (two) times daily as needed (irritation).) 50 g 5   hyoscyamine (LEVSIN SL) 0.125 MG SL tablet PLACE 1 TABLET UNDER THE TONGUE EVERY 4 HOURS AS NEEDED FOR CRAMPING (URGENT DEFECATION) (Patient taking differently: Take 0.125 mg by mouth every 4 (four) hours as needed for cramping.) 60 tablet 2   ketoconazole (NIZORAL) 2 % cream APPLY TWICE A DAY AS NEEDED FOR FUNGAL INFECTION (Patient taking differently: Apply 1 application topically 2 (two) times daily as needed for irritation. APPLY TWICE A DAY AS NEEDED FOR FUNGAL INFECTION) 30 g 5   losartan (COZAAR) 25 MG tablet Take 1 tablet (25 mg total) by mouth daily. 90 tablet 3   Multiple Vitamins-Minerals (MULTIVITAMIN WITH MINERALS) tablet Take 1 tablet by mouth daily.     polyethylene glycol (MIRALAX) 17 g packet Take 17 g by mouth daily. 14 each 0   pramoxine-hydrocortisone (ANALPRAM-HC) 1-1 % rectal cream Place 1 application rectally 2 (two) times daily. Use twice daily before SITZ baths 30 g 0   tadalafil (CIALIS) 5 MG tablet Take 1 tablet (5 mg total) by mouth daily. (Patient taking differently: Take 5 mg by mouth daily as needed for erectile dysfunction.) 90 tablet 3    traZODone (DESYREL) 100 MG tablet TAKE ONE HALF TABLETS (50 MG TOTAL) BY MOUTH AT BEDTIME. (Patient taking differently: Take 50 mg by mouth at bedtime.) 45 tablet 11   Turmeric 500 MG CAPS Take 500 mg by mouth daily.     vedolizumab (ENTYVIO) 300 MG injection INFUSE 300 MG IV AT WEEK 6, THEN EVERY 8 WEEKS THEREAFTER. (Patient taking differently: Inject 300 mg into the vein every 8 (eight) weeks. INFUSE 300 MG IV AT WEEK 6, THEN EVERY 8 WEEKS THEREAFTER.) 1 each 6   No current facility-administered medications for this visit.    Allergies:   Mercaptopurine, Shellfish allergy, Humira [adalimumab], and Wound dressing adhesive   Social History: Social History   Socioeconomic History   Marital status:  Married    Spouse name: Not on file   Number of children: 2   Years of education: Not on file   Highest education level: Not on file  Occupational History   Occupation: EHS manager    Employer: [employer]  Tobacco Use   Smoking status: Never   Smokeless tobacco: Never  Vaping Use   Vaping Use: Never used  Substance and Sexual Activity   Alcohol use: Yes    Alcohol/week: 0.0 standard drinks    Comment: occ   Drug use: No   Sexual activity: Not on file  Other Topics Concern   Not on file  Social History Narrative   He is married with 2 sons 1 son is an Art gallery manager and 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   He is Nurse, 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 [place]   Rare if any caffeine   Rare alcohol and never smoker   Social Determinants of Radio broadcast assistant Strain: Not on [employer] Insecurity: Not on file  Transportation Needs: Not on file  Physical Activity: Not on file  Stress: Not on file  Social Connections: Not on file  Intimate Partner Violence: Not on file    Family History: Family History  Problem Relation Age of Onset   Lung cancer Mother    Leukemia  Father    Hypertension Father    Heart disease Father    Heart attack Father    Prostate cancer Father    Prostate cancer Paternal Uncle    Colon cancer Neg Hx    Stomach cancer Neg Hx    Colon polyps Neg Hx    Esophageal cancer Neg Hx    Rectal cancer Neg Hx      Review of Systems: All other systems reviewed and are otherwise negative except as noted above.  Physical Exam: There were no vitals filed for this visit.   GEN- The patient is well appearing, alert and oriented x 3 today.   HEENT: normocephalic, atraumatic; sclera clear, conjunctiva pink; hearing intact; oropharynx clear; neck supple  Lungs- Clear to ausculation bilaterally, normal work of breathing.  No wheezes, rales, rhonchi Heart- Regular rate and rhythm, no murmurs, rubs or gallops  GI- soft, non-tender, non-distended, bowel sounds present  Extremities- no clubbing or cyanosis. No edema MS- no significant deformity or atrophy Skin- warm and dry, no rash or lesion; PPM pocket well healed Psych- euthymic mood, full affect Neuro- strength and sensation are intact  PPM Interrogation- reviewed in detail today,  See PACEART report  EKG:  EKG is not ordered today.  Recent Labs: 01/04/2021: ALT 14; BUN 14; Creatinine, Ser 1.11; Hemoglobin 15.2; Platelets 221.0; Potassium 3.8; Sodium 140   Wt Readings from Last 3 Encounters:  01/04/21 204 lb (92.5 kg)  12/29/20 209 lb (94.8 kg)  12/01/20 212 lb (96.2 kg)     Other studies Reviewed: Additional studies/ records that were reviewed today include: Previous EP office notes, Previous remote checks, Most recent labwork.   Assessment and Plan:  1. Sick sinus syndrome s/p St. Jude PPM  Normal PPM function See P[facility] Art report No changes today  2. Atypical chest pain In description now, more likely epigastric discomfort in setting of non-specified GI illness.  Recent work up unremarkable ETT 08/2020 without ischemia He now attributes this to a GI illness. His wife  had it  prior to his ER visit, and pt had a Crohn's flare after ER visit with subsequent resolution of his symptoms.   3. H/o SVT [place].  Continue current medications.  Current medicines are reviewed at length with the patient today.   The patient does not have concerns regarding his medicines.  The following changes were made today:  none  Labs/ tests ordered today include:  Labs from ER visit personally reviewed. Unremarkable.   Disposition:   Follow up with Dr. Curt Bears for 91 day post PPM as scheduled.     Jacalyn Lefevre, PA-C  01/06/2021 9:18 PM  [place] [address] [place] [address] [phone] (office) [phone] (fax)

## 2021-01-07 ENCOUNTER — Ambulatory Visit (INDEPENDENT_AMBULATORY_CARE_PROVIDER_SITE_OTHER): Payer: Medicare Other | Admitting: Student

## 2021-01-07 ENCOUNTER — Encounter: Payer: Self-pay | Admitting: Student

## 2021-01-07 VITALS — BP 120/70 | HR 63 | Ht 70.0 in | Wt 205.0 lb

## 2021-01-07 DIAGNOSIS — R0789 Other chest pain: Secondary | ICD-10-CM | POA: Diagnosis not present

## 2021-01-07 DIAGNOSIS — R5383 Other fatigue: Secondary | ICD-10-CM

## 2021-01-07 DIAGNOSIS — I4589 Other specified conduction disorders: Secondary | ICD-10-CM

## 2021-01-07 DIAGNOSIS — I495 Sick sinus syndrome: Secondary | ICD-10-CM | POA: Diagnosis not present

## 2021-01-07 LAB — CUP PACEART INCLINIC DEVICE CHECK
Battery Remaining Longevity: 90 mo
Battery Voltage: 3.01 V
Brady Statistic RA Percent Paced: 75 %
Brady Statistic RV Percent Paced: 0.24 %
Date Time Interrogation Session: 20220624110658
Implantable Lead Implant Date: 20220518
Implantable Lead Implant Date: 20220518
Implantable Lead Location: 753859
Implantable Lead Location: 753860
Implantable Lead Model: 1944
Implantable Pulse Generator Implant Date: 20220518
Lead Channel Impedance Value: 487.5 Ohm
Lead Channel Impedance Value: 537.5 Ohm
Lead Channel Pacing Threshold Amplitude: 0.5 V
Lead Channel Pacing Threshold Amplitude: 0.5 V
Lead Channel Pacing Threshold Amplitude: 0.75 V
Lead Channel Pacing Threshold Amplitude: 0.75 V
Lead Channel Pacing Threshold Pulse Width: 0.5 ms
Lead Channel Pacing Threshold Pulse Width: 0.5 ms
Lead Channel Pacing Threshold Pulse Width: 0.5 ms
Lead Channel Pacing Threshold Pulse Width: 0.5 ms
Lead Channel Sensing Intrinsic Amplitude: 5 mV
Lead Channel Sensing Intrinsic Amplitude: 6.8 mV
Lead Channel Setting Pacing Amplitude: 3.5 V
Lead Channel Setting Pacing Amplitude: 3.5 V
Lead Channel Setting Pacing Pulse Width: 0.5 ms
Lead Channel Setting Sensing Sensitivity: 2 mV
Pulse Gen Model: 2272
Pulse Gen Serial Number: 3925308

## 2021-01-07 NOTE — Patient Instructions (Signed)
Medication Instructions:  Your physician recommends that you continue on your current medications as directed. Please refer to the Current Medication list given to you today.  *If you need a refill on your cardiac medications before your next appointment, please call your pharmacy*   Lab Work: None If you have labs (blood work) drawn today and your tests are completely normal, you will receive your results only by: [place] (if you have MyChart) OR A paper copy in the mail If you have any lab test that is abnormal or we need to change your treatment, we will call you to review the results.   Follow-Up: At [facility], you and your health needs are our priority.  As part of our continuing mission to provide you with exceptional heart care, we have created designated Provider Care Teams.  These Care Teams include your primary Cardiologist (physician) and Advanced Practice Providers (APPs -  Physician Assistants and Nurse Practitioners) who all work together to provide you with the care you need, when you need it.   Your next appointment:   As scheduled

## 2021-03-01 ENCOUNTER — Ambulatory Visit (INDEPENDENT_AMBULATORY_CARE_PROVIDER_SITE_OTHER): Payer: Medicare Other | Admitting: Podiatry

## 2021-03-01 ENCOUNTER — Ambulatory Visit (INDEPENDENT_AMBULATORY_CARE_PROVIDER_SITE_OTHER): Payer: Medicare Other

## 2021-03-01 ENCOUNTER — Other Ambulatory Visit: Payer: Self-pay

## 2021-03-01 DIAGNOSIS — M7751 Other enthesopathy of right foot: Secondary | ICD-10-CM | POA: Diagnosis not present

## 2021-03-01 DIAGNOSIS — M778 Other enthesopathies, not elsewhere classified: Secondary | ICD-10-CM

## 2021-03-01 DIAGNOSIS — M779 Enthesopathy, unspecified: Secondary | ICD-10-CM

## 2021-03-01 DIAGNOSIS — M79671 Pain in right foot: Secondary | ICD-10-CM

## 2021-03-01 DIAGNOSIS — M7750 Other enthesopathy of unspecified foot: Secondary | ICD-10-CM

## 2021-03-01 MED ORDER — METHYLPREDNISOLONE 4 MG PO TBPK: ORAL_TABLET | ORAL | 0 refills | Status: DC

## 2021-03-01 NOTE — Patient Instructions (Signed)

## 2021-03-04 ENCOUNTER — Ambulatory Visit (INDEPENDENT_AMBULATORY_CARE_PROVIDER_SITE_OTHER): Payer: Medicare Other

## 2021-03-04 DIAGNOSIS — I495 Sick sinus syndrome: Secondary | ICD-10-CM | POA: Diagnosis not present

## 2021-03-06 LAB — CUP PACEART REMOTE DEVICE CHECK
Battery Remaining Longevity: 77 mo
Battery Remaining Percentage: 95.5 %
Battery Voltage: 2.99 V
Brady Statistic AP VP Percent: 1 %
Brady Statistic AP VS Percent: 70 %
Brady Statistic AS VP Percent: 1 %
Brady Statistic AS VS Percent: 30 %
Brady Statistic RA Percent Paced: 70 %
Brady Statistic RV Percent Paced: 1 %
Date Time Interrogation Session: 20220819020012
Implantable Lead Implant Date: 20220518
Implantable Lead Implant Date: 20220518
Implantable Lead Location: 753859
Implantable Lead Location: 753860
Implantable Lead Model: 1944
Implantable Pulse Generator Implant Date: 20220518
Lead Channel Impedance Value: 480 Ohm
Lead Channel Impedance Value: 580 Ohm
Lead Channel Pacing Threshold Amplitude: 0.5 V
Lead Channel Pacing Threshold Amplitude: 0.75 V
Lead Channel Pacing Threshold Pulse Width: 0.5 ms
Lead Channel Pacing Threshold Pulse Width: 0.5 ms
Lead Channel Sensing Intrinsic Amplitude: 4.1 mV
Lead Channel Sensing Intrinsic Amplitude: 5 mV
Lead Channel Setting Pacing Amplitude: 3.5 V
Lead Channel Setting Pacing Amplitude: 3.5 V
Lead Channel Setting Pacing Pulse Width: 0.5 ms
Lead Channel Setting Sensing Sensitivity: 2 mV
Pulse Gen Model: 2272
Pulse Gen Serial Number: 3925308

## 2021-03-07 ENCOUNTER — Other Ambulatory Visit: Payer: Self-pay

## 2021-03-07 ENCOUNTER — Encounter: Payer: Self-pay | Admitting: Cardiology

## 2021-03-07 ENCOUNTER — Ambulatory Visit (INDEPENDENT_AMBULATORY_CARE_PROVIDER_SITE_OTHER): Payer: Medicare Other | Admitting: Cardiology

## 2021-03-07 DIAGNOSIS — I452 Bifascicular block: Secondary | ICD-10-CM

## 2021-03-07 DIAGNOSIS — I471 Supraventricular tachycardia: Secondary | ICD-10-CM

## 2021-03-07 DIAGNOSIS — I495 Sick sinus syndrome: Secondary | ICD-10-CM

## 2021-03-07 NOTE — Progress Notes (Signed)
Electrophysiology Office Note   Date:  03/07/2021   ID:  [name], DOB Apr 24, 1951, MRN [id number]  PCP:  [name], MD  Cardiologist:  [name] Primary Electrophysiologist:  Jatinder Mcdonagh Meredith Leeds, MD    Chief Complaint: fatigue   History of Present Illness: [name] is a 70 y.o. male who is being seen today for the evaluation of chronotropic incompetence at the request of [name], MD. Presenting today for electrophysiology evaluation.  He has a history significant for Crohn's disease, asthma, dilated aortic root, and a distant history of SVT.  He also has bifascicular block and episodes of fatigue.  He was noted to have heart rates at times in the 40s.  He had not had syncope.  He wore a cardiac monitor which showed an average heart rate of 58 bpm but a maximal heart rate of 103 bpm.  He is now status post Saint Jude dual-chamber pacemaker implanted 12/01/2020 for chronotropic incompetence.  Today, denies symptoms of palpitations, chest pain, shortness of breath, orthopnea, PND, lower extremity edema, claudication, dizziness, presyncope, syncope, bleeding, or neurologic sequela. The patient is tolerating medications without difficulties.  Since his pacemaker was implanted he is felt much improved.  He has more energy and less fatigue and shortness of breath.  He continues to exercise without issue.  Past Medical History:  Diagnosis Date   Acute prostatitis 07/24/2007   Qualifier: Diagnosis of  By: [name] MD, [name]    Allergy    mild   Arthritis    osteoarthritis   Asthma    Blood transfusion without reported diagnosis    BPH (benign prostatic hypertrophy) with urinary obstruction    Crohn's ileitis ([place]) suspected 05/03/2017   Dilated aortic root ([place])    noted on echo 08/2012   Diverticulitis of colon    EPIDIDYMITIS 02/15/2010   Qualifier: Diagnosis of  By: [name] MD, [name]    GERD (gastroesophageal reflux disease)    H/O: GI bleed    Hemorrhoids     Hepatitis 1975   unknown type    HERPES SIMPLEX INFECTION 10/14/2007   Qualifier: Diagnosis of  By: [name] MD, [name]    Hiatal hernia    Ileus following gastrointestinal surgery ([place]) 12/26/2011   Long term (current) use of systemic steroids 06/18/2018   Psoriasis    sees [name] at [facility].   Recurrent ventral incisional hernia 05/10/2012   SVT (supraventricular tachycardia) ([place])    Ulcer 08/21/2013   ileal   Past Surgical History:  Procedure Laterality Date   BOWEL RESECTION  12/19/2011   Procedure: SMALL BOWEL RESECTION;  Surgeon: [name], MD;  [facility];  Service: General;  Laterality: N/A;  with anastamosis and insertion mesh   BRONCHOSCOPY     COLON SURGERY  01/2004   x 2   COLONOSCOPY W/ BIOPSIES  04/26/2017   per [name], no polyps, benign inflammation, repeat in 5 yrs    CYSTOSCOPY     ESOPHAGOGASTRODUODENOSCOPY     HEMICOLECTOMY     left side, at [facility], diverticulitis   [place]     [phone] incisional hernia   ILEOSTOMY     ILEOSTOMY CLOSURE     INSERTION OF MESH  07/31/2012   Procedure: INSERTION OF MESH;  Surgeon: [name], MD;  [facility];  Service: General;;   LAPAROTOMY  12/19/2011   Procedure: EXPLORATORY LAPAROTOMY;  Surgeon:  [name], MD;  [facility];  Service: General;  Laterality: N/A;   PACEMAKER IMPLANT N/A 12/01/2020   Procedure: PACEMAKER IMPLANT;  Surgeon: [name], MD;  Location: [place] [facility];  Service: Cardiovascular;  Laterality: N/A;   PACEMAKER INSERTION Left    TONSILLECTOMY     UPPER GASTROINTESTINAL ENDOSCOPY     VASECTOMY     VENTRAL HERNIA REPAIR  07/31/2012   Procedure: HERNIA REPAIR VENTRAL ADULT;  Surgeon: [name], MD;  [facility];  Service: General;  Laterality: N/A;     Current Outpatient Medications  Medication Sig Dispense Refill   albuterol (PROVENTIL HFA;VENTOLIN HFA) 108 (90 Base) MCG/ACT  inhaler INHALE 2 PUFFS INTO THE LUNGS EVERY 4 (FOUR) HOURS AS NEEDED FOR WHEEZING OR SHORTNESS OF BREATH. 8.5 Inhaler 2   benzonatate (TESSALON PERLES) 100 MG capsule Take 1 capsule (100 mg total) by mouth 3 (three) times daily as needed. 20 capsule 0   budesonide (ENTOCORT EC) 3 MG 24 hr capsule Take 3 capsules (9 mg total) by mouth daily. 90 capsule 0   diphenoxylate-atropine (LOMOTIL) 2.5-0.025 MG tablet 1-2 tablets 4 times a day as neded 90 tablet 1   EPINEPHrine (EPIPEN 2-PAK) 0.3 mg/0.3 mL IJ SOAJ injection Inject 0.3 mLs (0.3 mg total) into the muscle as needed for anaphylaxis. 1 Device 0   esomeprazole (NEXIUM) 20 MG capsule Take 20 mg by mouth daily as needed (acid reflux).     fluticasone (CUTIVATE) 0.05 % cream Apply 1 application topically 2 (two) times daily as needed for irritation.     halobetasol (ULTRAVATE) 0.05 % ointment Apply topically 2 (two) times daily. 50 g 5   hyoscyamine (LEVSIN SL) 0.125 MG SL tablet PLACE 1 TABLET UNDER THE TONGUE EVERY 4 HOURS AS NEEDED FOR CRAMPING (URGENT DEFECATION) 60 tablet 2   ketoconazole (NIZORAL) 2 % cream APPLY TWICE A DAY AS NEEDED FOR FUNGAL INFECTION (Patient taking differently: Apply 1 application topically 2 (two) times daily as needed for irritation. APPLY TWICE A DAY AS NEEDED FOR FUNGAL INFECTION) 30 g 5   losartan (COZAAR) 25 MG tablet Take 1 tablet (25 mg total) by mouth daily. 90 tablet 3   Multiple Vitamins-Minerals (MULTIVITAMIN WITH MINERALS) tablet Take 1 tablet by mouth daily.     polyethylene glycol (MIRALAX) 17 g packet Take 17 g by mouth daily. 14 each 0   pramoxine-hydrocortisone (ANALPRAM-HC) 1-1 % rectal cream Place 1 application rectally 2 (two) times daily. Use twice daily before SITZ baths 30 g 0   tadalafil (CIALIS) 5 MG tablet Take 1 tablet (5 mg total) by mouth daily. 90 tablet 3   traZODone (DESYREL) 100 MG tablet TAKE ONE HALF TABLETS (50 MG TOTAL) BY MOUTH AT BEDTIME. 45 tablet 11   Turmeric 500 MG CAPS Take 500 mg  by mouth daily.     vedolizumab (ENTYVIO) 300 MG injection INFUSE 300 MG IV AT WEEK 6, THEN EVERY 8 WEEKS THEREAFTER. 1 each 6   methylPREDNISolone (MEDROL DOSEPAK) 4 MG TBPK tablet Take as directed (Patient not taking: Reported on 03/07/2021) 21 tablet 0   No current facility-administered medications for this visit.    Allergies:   Mercaptopurine, Shellfish allergy, Humira [adalimumab], and Wound dressing adhesive   Social History:  The patient  reports that he has never smoked. He has never used smokeless tobacco. He reports current alcohol use. He reports that he does not use drugs.   Family History:  The patient's family history  includes Heart attack in his father; Heart disease in his father; Hypertension in his father; Leukemia in his father; Lung cancer in his mother; Prostate cancer in his father and paternal uncle.   ROS:  Please see the history of present illness.   Otherwise, review of systems is positive for none.   All other systems are reviewed and negative.   PHYSICAL EXAM: VS:  BP 124/68   Pulse 72   Ht 5' 10"  (1.778 m)   Wt 210 lb 3.2 oz (95.3 kg)   SpO2 96%   BMI 30.16 kg/m  , BMI Body mass index is 30.16 kg/m. GEN: Well nourished, well developed, in no acute distress  HEENT: normal  Neck: no JVD, carotid bruits, or masses Cardiac: RRR; no murmurs, rubs, or gallops,no edema  Respiratory:  clear to auscultation bilaterally, normal work of breathing GI: soft, nontender, nondistended, + BS MS: no deformity or atrophy  Skin: warm and dry, device site well healed Neuro:  Strength and sensation are intact Psych: euthymic mood, full affect  EKG:  EKG is ordered today. Personal review of the ekg ordered shows sinus rhythm, rate 72  Personal review of the device interrogation today. Results in [place]: 01/04/2021: ALT 14; BUN 14; Creatinine, Ser 1.11; Hemoglobin 15.2; Platelets 221.0; Potassium 3.8; Sodium 140    Lipid Panel     Component Value  Date/Time   CHOL 142 01/08/2018 1539   TRIG (H) 01/08/2018 1539    460.0 Triglyceride is over 400; calculations on Lipids are invalid.   HDL 26.70 (L) 01/08/2018 1539   CHOLHDL 5 01/08/2018 1539   VLDL 43.6 (H) 05/16/2012 1042   LDLCALC  09/05/2008 0315    53        Total Cholesterol/HDL:CHD Risk Coronary Heart Disease Risk Table                     Men   Women  1/2 Average Risk   3.4   3.3  Average Risk       5.0   4.4  2 X Average Risk   9.6   7.1  3 X Average Risk  23.4   11.0        Use the calculated Patient Ratio above and the CHD Risk Table to determine the patient's CHD Risk.        ATP III CLASSIFICATION (LDL):  <100     mg/dL   Optimal  100-129  mg/dL   Near or Above                    Optimal  130-159  mg/dL   Borderline  160-189  mg/dL   High  >190     mg/dL   Very High   LDLDIRECT 71.0 01/08/2018 1539     Wt Readings from Last 3 Encounters:  03/07/21 210 lb 3.2 oz (95.3 kg)  01/07/21 205 lb (93 kg)  01/04/21 204 lb (92.5 kg)      Other studies Reviewed: Additional studies/ records that were reviewed today include: TTE 08/12/19 she has been very was Review of the above records today demonstrates:   1. Left ventricular ejection fraction, by visual estimation, is 50 to  55%. The left ventricle has low normal function. There is no left  ventricular hypertrophy.   2. The left ventricle demonstrates regional wall motion abnormalities.   3. Distal septal/ apical hypokinesis Normal GLS -18.8.   4. Global right ventricle  has normal systolic function.The right  ventricular size is normal. No increase in right ventricular wall  thickness.   5. Left atrial size was normal.   6. Right atrial size was normal.   7. The mitral valve is normal in structure. Trivial mitral valve  regurgitation. No evidence of mitral stenosis.   8. The tricuspid valve is normal in structure.   9. The tricuspid valve is normal in structure. Tricuspid valve  regurgitation is not  demonstrated.  10. The aortic valve is normal in structure. Aortic valve regurgitation is  not visualized. No evidence of aortic valve sclerosis or stenosis.  11. The pulmonic valve was normal in structure. Pulmonic valve  regurgitation is not visualized.  12. Normal pulmonary artery systolic pressure.  13. The inferior vena cava is normal in size with greater than 50%  respiratory variability, suggesting right atrial pressure of 3 mmHg.   Cardiac monitor 07/28/2020 personally reviewed Patient had a min HR of 40 bpm, max HR of 103 bpm, and avg HR of 58 bpm. Predominant underlying rhythm was Sinus Rhythm. 1 run of Supraventricular Tachycardia occurred lasting 4 beats with a max rate of 99 bpm (avg 91 bpm). Isolated SVEs were rare  (<1.0%), SVE Couplets were rare (<1.0%), and no SVE Triplets were present. Isolated VEs were rare (<1.0%), and no VE Couplets or VE Triplets were present. No significant arrhythmias  ETT 08/24/2020 personally reviewed Blood pressure demonstrated a hypertensive response to exercise. Target heart rate was not achieved. There was no ST segment deviation noted during stress. No ischemic changes were noted at sub-target heart rate. Consider functional stress testing or coronary CT-A if clinically indicated.   ASSESSMENT AND PLAN:  1.  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  Device functioning appropriately.  No changes at this time.  2.  History of SVT: No current episodes.  Continue to monitor.  Current medicines are reviewed at length with the patient today.   The patient does not have concerns regarding his medicines.  The following changes were made today:  none  Labs/ tests ordered today include:  No orders of the defined types were placed in this encounter.    Disposition:   FU with Tag Wurtz 9 months  Signed, Aurora Rody Meredith Leeds, MD  03/07/2021 2:25 PM     [place] [address] [phone] (office) [phone] (fax)

## 2021-03-08 ENCOUNTER — Other Ambulatory Visit: Payer: Self-pay | Admitting: Podiatry

## 2021-03-08 DIAGNOSIS — M7750 Other enthesopathy of unspecified foot: Secondary | ICD-10-CM

## 2021-03-08 NOTE — Progress Notes (Signed)
Subjective:   Patient ID: [name], male   DOB: 70 y.o.   MRN: [id number]   HPI 70 year old male presents the office today for concerns of right foot pain.  He states this started on February 27, 2021.  He states that mostly the pain is on the right side of the foot.  He denies having hot water as well as taking Tylenol.  He states that he has been wearing a hiking boot which has been helping.  He states that he did do a lot more [place] the [place] which may have aggravated his symptoms and he was not wearing supportive shoes and wearing flip-flops.  No recent injury that he reports.  No swelling or redness.   Review of Systems  All other systems reviewed and are negative.  Past Medical History:  Diagnosis Date   Acute prostatitis 07/24/2007   Qualifier: Diagnosis of  By: [name] MD, [name]    Allergy    mild   Arthritis    osteoarthritis   Asthma    Blood transfusion without reported diagnosis    BPH (benign prostatic hypertrophy) with urinary obstruction    Crohn's ileitis ([place]) suspected 05/03/2017   Dilated aortic root ([place])    noted on echo 08/2012   Diverticulitis of colon    EPIDIDYMITIS 02/15/2010   Qualifier: Diagnosis of  By: [name] MD, [name]    GERD (gastroesophageal reflux disease)    H/O: GI bleed    Hemorrhoids    Hepatitis 1975   unknown type    HERPES SIMPLEX INFECTION 10/14/2007   Qualifier: Diagnosis of  By: [name] MD, [name]    Hiatal hernia    Ileus following gastrointestinal surgery ([place]) 12/26/2011   Long term (current) use of systemic steroids 06/18/2018   Psoriasis    sees [name] at [facility].   Recurrent ventral incisional hernia 05/10/2012   SVT (supraventricular tachycardia) ([place])    Ulcer 08/21/2013   ileal    Past Surgical History:  Procedure Laterality Date   BOWEL RESECTION  12/19/2011   Procedure: SMALL BOWEL RESECTION;  Surgeon: [name], MD;  Location: [facility];  Service: General;  Laterality: N/A;  with  anastamosis and insertion mesh   BRONCHOSCOPY     COLON SURGERY  01/2004   x 2   COLONOSCOPY W/ BIOPSIES  04/26/2017   per [name], no polyps, benign inflammation, repeat in 5 yrs    CYSTOSCOPY     ESOPHAGOGASTRODUODENOSCOPY     HEMICOLECTOMY     left side, at [facility], diverticulitis   [place]     [phone] incisional hernia   ILEOSTOMY     ILEOSTOMY CLOSURE     INSERTION OF MESH  07/31/2012   Procedure: INSERTION OF MESH;  Surgeon: [name], MD;  [facility];  Service: General;;   LAPAROTOMY  12/19/2011   Procedure: EXPLORATORY LAPAROTOMY;  Surgeon: [name], MD;  [facility]: [facility];  Service: General;  Laterality: N/A;   PACEMAKER IMPLANT N/A 12/01/2020   Procedure: PACEMAKER IMPLANT;  Surgeon: [name], MD;  Location: [place] [facility];  Service: Cardiovascular;  Laterality: N/A;   PACEMAKER INSERTION Left    TONSILLECTOMY     UPPER GASTROINTESTINAL ENDOSCOPY     VASECTOMY     VENTRAL HERNIA REPAIR  07/31/2012   Procedure: HERNIA REPAIR VENTRAL ADULT;  Surgeon: [name], MD;  [facility];  Service: General;  Laterality: N/A;     Current Outpatient Medications:    methylPREDNISolone (MEDROL DOSEPAK) 4 MG TBPK tablet, Take as directed (Patient not taking: Reported on 03/07/2021), Disp: 21 tablet, Rfl: 0   albuterol (PROVENTIL HFA;VENTOLIN HFA) 108 (90 Base) MCG/ACT inhaler, INHALE 2 PUFFS INTO THE LUNGS EVERY 4 (FOUR) HOURS AS NEEDED FOR WHEEZING OR SHORTNESS OF BREATH., Disp: 8.5 Inhaler, Rfl: 2   benzonatate (TESSALON PERLES) 100 MG capsule, Take 1 capsule (100 mg total) by mouth 3 (three) times daily as needed., Disp: 20 capsule, Rfl: 0   budesonide (ENTOCORT EC) 3 MG 24 hr capsule, Take 3 capsules (9 mg total) by mouth daily., Disp: 90 capsule, Rfl: 0   diphenoxylate-atropine (LOMOTIL) 2.5-0.025 MG tablet, 1-2 tablets 4 times a day as neded, Disp: 90 tablet, Rfl: 1   EPINEPHrine (EPIPEN 2-PAK)  0.3 mg/0.3 mL IJ SOAJ injection, Inject 0.3 mLs (0.3 mg total) into the muscle as needed for anaphylaxis., Disp: 1 Device, Rfl: 0   esomeprazole (NEXIUM) 20 MG capsule, Take 20 mg by mouth daily as needed (acid reflux)., Disp: , Rfl:    fluticasone (CUTIVATE) 0.05 % cream, Apply 1 application topically 2 (two) times daily as needed for irritation., Disp: , Rfl:    halobetasol (ULTRAVATE) 0.05 % ointment, Apply topically 2 (two) times daily., Disp: 50 g, Rfl: 5   hyoscyamine (LEVSIN SL) 0.125 MG SL tablet, PLACE 1 TABLET UNDER THE TONGUE EVERY 4 HOURS AS NEEDED FOR CRAMPING (URGENT DEFECATION), Disp: 60 tablet, Rfl: 2   ketoconazole (NIZORAL) 2 % cream, APPLY TWICE A DAY AS NEEDED FOR FUNGAL INFECTION (Patient taking differently: Apply 1 application topically 2 (two) times daily as needed for irritation. APPLY TWICE A DAY AS NEEDED FOR FUNGAL INFECTION), Disp: 30 g, Rfl: 5   losartan (COZAAR) 25 MG tablet, Take 1 tablet (25 mg total) by mouth daily., Disp: 90 tablet, Rfl: 3   Multiple Vitamins-Minerals (MULTIVITAMIN WITH MINERALS) tablet, Take 1 tablet by mouth daily., Disp: , Rfl:    polyethylene glycol (MIRALAX) 17 g packet, Take 17 g by mouth daily., Disp: 14 each, Rfl: 0   pramoxine-hydrocortisone (ANALPRAM-HC) 1-1 % rectal cream, Place 1 application rectally 2 (two) times daily. Use twice daily before SITZ baths, Disp: 30 g, Rfl: 0   tadalafil (CIALIS) 5 MG tablet, Take 1 tablet (5 mg total) by mouth daily., Disp: 90 tablet, Rfl: 3   traZODone (DESYREL) 100 MG tablet, TAKE ONE HALF TABLETS (50 MG TOTAL) BY MOUTH AT BEDTIME., Disp: 45 tablet, Rfl: 11   Turmeric 500 MG CAPS, Take 500 mg by mouth daily., Disp: , Rfl:    vedolizumab (ENTYVIO) 300 MG injection, INFUSE 300 MG IV AT WEEK 6, THEN EVERY 8 WEEKS THEREAFTER., Disp: 1 each, Rfl: 6  Allergies  Allergen Reactions   Mercaptopurine Other (See Comments)    Pancreatitis    Shellfish Allergy Anaphylaxis    Throat started to close   Humira  [Adalimumab]     Developed antibodies   Wound Dressing Adhesive Rash          Objective:  Physical Exam  General: AAO x3, NAD  Dermatological: Skin is warm, dry and supple bilateral. . There are no open sores, no preulcerative lesions, no rash or signs of infection present.  Vascular: Dorsalis Pedis artery and Posterior Tibial artery pedal pulses are 2/4 bilateral with immedate capillary fill time. here is no pain with calf compression, swelling, warmth, erythema.   Neruologic: Grossly intact via light  touch bilateral.   Musculoskeletal:  Majority tenderness is along the distal portion of the peroneal tendons the lateral aspect of the right foot.  Mild discomfort along the lateral portion of the plantar fascia.  Overall exam appears to be intact.  There is minimal edema.  No erythema or warmth there is no area pinpoint tenderness.Muscular strength 5/5 in all groups tested bilateral.  Gait: Unassisted, Nonantalgic.       Assessment:   Tendinitis, right     Plan:  -Treatment options discussed including all alternatives, risks, and complications -Etiology of symptoms were discussed -X-rays obtained reviewed.  No evidence of acute fracture identified. -Hiking boots seem to help and I would recommend continue with this.  Consider ankle brace as needed as well.  Continue to ice and anti-inflammatories as needed.  Discussed traction exercises regular worksheet for plantar fasciitis as I think this will be helpful working on overall foot and ankle stretching.    Trula Slade DPM

## 2021-03-09 ENCOUNTER — Other Ambulatory Visit (INDEPENDENT_AMBULATORY_CARE_PROVIDER_SITE_OTHER): Payer: Medicare Other | Admitting: Neurology

## 2021-03-09 DIAGNOSIS — I471 Supraventricular tachycardia: Secondary | ICD-10-CM

## 2021-03-09 NOTE — Progress Notes (Signed)
Order entered for EKG.

## 2021-03-22 NOTE — Progress Notes (Signed)
Remote pacemaker transmission.   

## 2021-04-12 ENCOUNTER — Ambulatory Visit (INDEPENDENT_AMBULATORY_CARE_PROVIDER_SITE_OTHER): Payer: Medicare Other | Admitting: Family Medicine

## 2021-04-12 ENCOUNTER — Other Ambulatory Visit: Payer: Self-pay

## 2021-04-12 ENCOUNTER — Encounter: Payer: Self-pay | Admitting: Family Medicine

## 2021-04-12 VITALS — BP 120/70 | HR 59 | Temp 98.0°F | Wt 212.6 lb

## 2021-04-12 DIAGNOSIS — N6322 Unspecified lump in the left breast, upper inner quadrant: Secondary | ICD-10-CM

## 2021-04-12 MED ORDER — TRAZODONE HCL 50 MG PO TABS: 50.0000 mg | ORAL_TABLET | Freq: Every day | ORAL | 3 refills | Status: DC

## 2021-04-12 NOTE — Progress Notes (Signed)
   Subjective:    Patient ID: [name], male    DOB: 1950/11/04, 70 y.o.   MRN: [id number]  HPI Here for one week of a tender lump in the left chest area. No recent trauma. No fever. He had a pacemaker placed in the left supraclavicular area in June.    Review of Systems  Constitutional: Negative.   Respiratory: Negative.    Cardiovascular: Negative.       Objective:   Physical Exam Constitutional:      Appearance: Normal appearance.  Cardiovascular:     Rate and Rhythm: Normal rate and regular rhythm.     Pulses: Normal pulses.     Heart sounds: Normal heart sounds.  Skin:    Comments: There is a tender well defined round smooth mobile lump in the left breast area at the 10 o'clock position along the left anterior axillary line. It is about 1 cm in diameter   Neurological:     Mental Status: He is alert.          Assessment & Plan:  Left breast lump, likely an inflamed lymph node. We will set up an US of the left breast and axilla soon. Hopefully this will resolve on its own in the coming weeks.  Alysia Penna, MD

## 2021-04-13 ENCOUNTER — Other Ambulatory Visit: Payer: Self-pay | Admitting: Family Medicine

## 2021-04-13 DIAGNOSIS — N6322 Unspecified lump in the left breast, upper inner quadrant: Secondary | ICD-10-CM

## 2021-05-18 ENCOUNTER — Ambulatory Visit
Admission: RE | Admit: 2021-05-18 | Discharge: 2021-05-18 | Disposition: A | Discharge status: Home / Self Care | Payer: Medicare Other | Source: Ambulatory Visit | Attending: Family Medicine | Admitting: Family Medicine

## 2021-05-18 ENCOUNTER — Other Ambulatory Visit: Payer: Self-pay

## 2021-05-18 DIAGNOSIS — N6322 Unspecified lump in the left breast, upper inner quadrant: Secondary | ICD-10-CM

## 2021-06-03 ENCOUNTER — Ambulatory Visit (INDEPENDENT_AMBULATORY_CARE_PROVIDER_SITE_OTHER): Payer: Medicare Other

## 2021-06-03 DIAGNOSIS — I495 Sick sinus syndrome: Secondary | ICD-10-CM | POA: Diagnosis not present

## 2021-06-03 LAB — CUP PACEART REMOTE DEVICE CHECK
Battery Remaining Longevity: 125 mo
Battery Remaining Percentage: 95.5 %
Battery Voltage: 3.01 V
Brady Statistic AP VP Percent: 1 %
Brady Statistic AP VS Percent: 68 %
Brady Statistic AS VP Percent: 1 %
Brady Statistic AS VS Percent: 31 %
Brady Statistic RA Percent Paced: 68 %
Brady Statistic RV Percent Paced: 1 %
Date Time Interrogation Session: 20221118020019
Implantable Lead Implant Date: 20220518
Implantable Lead Implant Date: 20220518
Implantable Lead Location: 753859
Implantable Lead Location: 753860
Implantable Lead Model: 1944
Implantable Pulse Generator Implant Date: 20220518
Lead Channel Impedance Value: 490 Ohm
Lead Channel Impedance Value: 550 Ohm
Lead Channel Pacing Threshold Amplitude: 0.5 V
Lead Channel Pacing Threshold Amplitude: 0.625 V
Lead Channel Pacing Threshold Pulse Width: 0.5 ms
Lead Channel Pacing Threshold Pulse Width: 0.5 ms
Lead Channel Sensing Intrinsic Amplitude: 3.6 mV
Lead Channel Sensing Intrinsic Amplitude: 5 mV
Lead Channel Setting Pacing Amplitude: 0.875
Lead Channel Setting Pacing Amplitude: 1.5 V
Lead Channel Setting Pacing Pulse Width: 0.5 ms
Lead Channel Setting Sensing Sensitivity: 2 mV
Pulse Gen Model: 2272
Pulse Gen Serial Number: 3925308

## 2021-06-13 NOTE — Progress Notes (Signed)
Remote pacemaker transmission.   

## 2021-08-01 ENCOUNTER — Encounter: Payer: Self-pay | Admitting: Family Medicine

## 2021-08-01 ENCOUNTER — Telehealth: Payer: Self-pay | Admitting: Family Medicine

## 2021-08-01 ENCOUNTER — Telehealth (INDEPENDENT_AMBULATORY_CARE_PROVIDER_SITE_OTHER): Payer: Medicare Other | Admitting: Family Medicine

## 2021-08-01 VITALS — Temp 100.3°F

## 2021-08-01 DIAGNOSIS — J019 Acute sinusitis, unspecified: Secondary | ICD-10-CM | POA: Diagnosis not present

## 2021-08-01 MED ORDER — AMOXICILLIN-POT CLAVULANATE 875-125 MG PO TABS: 1.0000 | ORAL_TABLET | Freq: Two times a day (BID) | ORAL | 0 refills | Status: DC

## 2021-08-01 MED ORDER — METHYLPREDNISOLONE 4 MG PO TBPK: ORAL_TABLET | ORAL | 0 refills | Status: DC

## 2021-08-01 NOTE — Telephone Encounter (Signed)
Patient completed video visit on 08/01/21 at 10:15.

## 2021-08-01 NOTE — Progress Notes (Signed)
Subjective:    Patient ID: [name], male    DOB: [birth year], 71 y.o.   MRN: [id number]  HPI Virtual Visit via Video Note  I connected with the patient on 08/01/21 at 10:15 AM EST by a video enabled telemedicine application and verified that I am speaking with the correct person using two identifiers.  Location patient: home Location provider:work or home office Persons participating in the virtual visit: patient, provider  I discussed the limitations of evaluation and management by telemedicine and the availability of in person appointments. The patient expressed understanding and agreed to proceed.   HPI: Here for 3 days of fever to 100.3 degrees, sinus congestion, PND, ST, chest tightness and a dry cough. He tested negative for the Covid-19 virus. He is using his inhaler and taking Tylenol.    ROS: See pertinent positives and negatives per HPI.  Past Medical History:  Diagnosis Date   Acute prostatitis 07/24/2007   Qualifier: Diagnosis of  By: Clent Ridges MD, Tera Mater    Allergy    mild   Arthritis    osteoarthritis   Asthma    Blood transfusion without reported diagnosis    BPH (benign prostatic hypertrophy) with urinary obstruction    Crohn's ileitis ([facility]) suspected 05/03/2017   Dilated aortic root ([facility])    noted on echo 08/2012   Diverticulitis of colon    EPIDIDYMITIS 02/15/2010   Qualifier: Diagnosis of  By: Clent Ridges MD, Tera Mater    GERD (gastroesophageal reflux disease)    H/O: GI bleed    Hemorrhoids    Hepatitis 1975   unknown type    HERPES SIMPLEX INFECTION 10/14/2007   Qualifier: Diagnosis of  By: Clent Ridges MD, Jeannett Senior A    Hiatal hernia    Ileus following gastrointestinal surgery ([facility]) 12/26/2011   Long term (current) use of systemic steroids 06/18/2018   Psoriasis    sees Dr. Karene Jose Alleyne at [facility].   Recurrent ventral incisional hernia 05/10/2012   SVT (supraventricular tachycardia) ([facility])    Ulcer 08/21/2013   ileal    Past Surgical History:  Procedure  Laterality Date   BOWEL RESECTION  12/19/2011   Procedure: SMALL BOWEL RESECTION;  Surgeon: Mariella Saa, MD;  [facility];  Service: General;  Laterality: N/A;  with anastamosis and insertion mesh   BRONCHOSCOPY     COLON SURGERY  01/2004   x 2   COLONOSCOPY W/ BIOPSIES  04/26/2017   per Dr. Leone Payor, no polyps, benign inflammation, repeat in 5 yrs    CYSTOSCOPY     ESOPHAGOGASTRODUODENOSCOPY     HEMICOLECTOMY     left side, at [facility], diverticulitis   HEMORRHOID BANDING     HERNIA REPAIR     [phone] incisional hernia   ILEOSTOMY     ILEOSTOMY CLOSURE     INSERTION OF MESH  07/31/2012   Procedure: INSERTION OF MESH;  Surgeon: Mariella Saa, MD;  [facility];  Service: General;;   LAPAROTOMY  12/19/2011   Procedure: EXPLORATORY LAPAROTOMY;  Surgeon: Mariella Saa, MD;  [facility];  Service: General;  Laterality: N/A;   PACEMAKER IMPLANT N/A 12/01/2020   Procedure: PACEMAKER IMPLANT;  Surgeon: Regan Lemming, MD;  [facility];  Service: Cardiovascular;  Laterality: N/A;   PACEMAKER INSERTION Left    TONSILLECTOMY     UPPER GASTROINTESTINAL ENDOSCOPY     VASECTOMY     VENTRAL HERNIA REPAIR  07/31/2012   Procedure: HERNIA  REPAIR VENTRAL ADULT;  Surgeon: Mariella Saa, MD;  [facility];  Service: General;  Laterality: N/A;    Family History  Problem Relation Age of Onset   Lung cancer Mother    Leukemia Father    Hypertension Father    Heart disease Father    Heart attack Father    Prostate cancer Father    Prostate cancer Paternal Uncle    Colon cancer Neg Hx    Stomach cancer Neg Hx    Colon polyps Neg Hx    Esophageal cancer Neg Hx    Rectal cancer Neg Hx      Current Outpatient Medications:    albuterol (PROVENTIL HFA;VENTOLIN HFA) 108 (90 Base) MCG/ACT inhaler, INHALE 2 PUFFS INTO THE LUNGS EVERY 4 (FOUR) HOURS AS NEEDED FOR WHEEZING OR SHORTNESS OF BREATH., Disp: 8.5 Inhaler, Rfl: 2    amoxicillin-clavulanate (AUGMENTIN) 875-125 MG tablet, Take 1 tablet by mouth 2 (two) times daily., Disp: 20 tablet, Rfl: 0   budesonide (ENTOCORT EC) 3 MG 24 hr capsule, Take 3 capsules (9 mg total) by mouth daily., Disp: 90 capsule, Rfl: 0   diphenoxylate-atropine (LOMOTIL) 2.5-0.025 MG tablet, 1-2 tablets 4 times a day as neded, Disp: 90 tablet, Rfl: 1   EPINEPHrine (EPIPEN 2-PAK) 0.3 mg/0.3 mL IJ SOAJ injection, Inject 0.3 mLs (0.3 mg total) into the muscle as needed for anaphylaxis., Disp: 1 Device, Rfl: 0   esomeprazole (NEXIUM) 20 MG capsule, Take 20 mg by mouth daily as needed (acid reflux)., Disp: , Rfl:    fluticasone (CUTIVATE) 0.05 % cream, Apply 1 application topically 2 (two) times daily as needed for irritation., Disp: , Rfl:    halobetasol (ULTRAVATE) 0.05 % ointment, Apply topically 2 (two) times daily., Disp: 50 g, Rfl: 5   hyoscyamine (LEVSIN SL) 0.125 MG SL tablet, PLACE 1 TABLET UNDER THE TONGUE EVERY 4 HOURS AS NEEDED FOR CRAMPING (URGENT DEFECATION), Disp: 60 tablet, Rfl: 2   ketoconazole (NIZORAL) 2 % cream, APPLY TWICE A DAY AS NEEDED FOR FUNGAL INFECTION (Patient taking differently: Apply 1 application topically 2 (two) times daily as needed for irritation. APPLY TWICE A DAY AS NEEDED FOR FUNGAL INFECTION), Disp: 30 g, Rfl: 5   losartan (COZAAR) 25 MG tablet, Take 1 tablet (25 mg total) by mouth daily., Disp: 90 tablet, Rfl: 3   methylPREDNISolone (MEDROL DOSEPAK) 4 MG TBPK tablet, As directed, Disp: 21 tablet, Rfl: 0   Multiple Vitamins-Minerals (MULTIVITAMIN WITH MINERALS) tablet, Take 1 tablet by mouth daily., Disp: , Rfl:    polyethylene glycol (MIRALAX) 17 g packet, Take 17 g by mouth daily., Disp: 14 each, Rfl: 0   pramoxine-hydrocortisone (ANALPRAM-HC) 1-1 % rectal cream, Place 1 application rectally 2 (two) times daily. Use twice daily before SITZ baths, Disp: 30 g, Rfl: 0   tadalafil (CIALIS) 5 MG tablet, Take 1 tablet (5 mg total) by mouth daily., Disp: 90 tablet,  Rfl: 3   traZODone (DESYREL) 50 MG tablet, Take 1 tablet (50 mg total) by mouth at bedtime., Disp: 90 tablet, Rfl: 3   Turmeric 500 MG CAPS, Take 500 mg by mouth daily., Disp: , Rfl:    vedolizumab (ENTYVIO) 300 MG injection, INFUSE 300 MG IV AT WEEK 6, THEN EVERY 8 WEEKS THEREAFTER., Disp: 1 each, Rfl: 6  EXAM:  VITALS per patient if applicable:  GENERAL: alert, oriented, appears well and in no acute distress  HEENT: atraumatic, conjunttiva clear, no obvious abnormalities on inspection of external nose and ears  NECK:  normal movements of the head and neck  LUNGS: on inspection no signs of respiratory distress, breathing rate appears normal, no obvious gross SOB, gasping or wheezing  CV: no obvious cyanosis  MS: moves all visible extremities without noticeable abnormality  PSYCH/NEURO: pleasant and cooperative, no obvious depression or anxiety, speech and thought processing grossly intact  ASSESSMENT AND PLAN: Sinusitis, treat with 10 days of Augmentin. Add a Medrol dose pack. Gershon Crane, MD  Discussed the following assessment and plan:  No diagnosis found.     I discussed the assessment and treatment plan with the patient. The patient was provided an opportunity to ask questions and all were answered. The patient agreed with the plan and demonstrated an understanding of the instructions.   The patient was advised to call back or seek an in-person evaluation if the symptoms worsen or if the condition fails to improve as anticipated.      Review of Systems     Objective:   Physical Exam        Assessment & Plan:

## 2021-08-01 NOTE — Telephone Encounter (Signed)
Patient calling in with respiratory symptoms: Shortness of breath, chest pain, palpitations or other red words send to Triage  Does the patient have a fever over 100, cough, congestion, sore throat, runny nose, lost of taste/smell (please list symptoms that patient has)? congestion and wheezing in chest, sore throat, fever, cough, asthma  What date did symptoms start? 3 days ago (If over 5 days ago, pt may be scheduled for in person visit)  Have you tested for Covid in the last 5 days? Yes   If yes, was it positive []  OR negative [x] ? If positive in the last 5 days, please schedule virtual visit now. If negative, schedule for an in person OV with the next available provider if PCP has no openings. Please also let patient know they will be tested again (follow the script below)  "you will have to arrive 15mins prior to your appt time to be Covid tested. Please park in back of office at the cone & call [phone] to let the staff know you have arrived. A staff member will meet you at your car to do a rapid covid test. Once the test has resulted you will be notified by phone of your results to determine if appt will remain an in person visit or be converted to a virtual/phone visit. If you arrive less than 57mins before your appt time, your visit will be automatically converted to virtual & any recommended testing will happen AFTER the visit."   [place]  If no availability for virtual visit in office,  please schedule another [address] office  If no availability at another [place] office, please instruct patient that they can schedule an evisit or virtual visit through their mychart account. Visits up to 8pm  patients can be seen in office 5 days after positive COVID test

## 2021-08-15 ENCOUNTER — Other Ambulatory Visit (INDEPENDENT_AMBULATORY_CARE_PROVIDER_SITE_OTHER): Payer: Medicare Other

## 2021-08-15 ENCOUNTER — Encounter: Payer: Self-pay | Admitting: Internal Medicine

## 2021-08-15 ENCOUNTER — Ambulatory Visit (INDEPENDENT_AMBULATORY_CARE_PROVIDER_SITE_OTHER): Payer: Medicare Other | Admitting: Internal Medicine

## 2021-08-15 VITALS — BP 100/60 | HR 93 | Ht 70.0 in | Wt 208.0 lb

## 2021-08-15 DIAGNOSIS — Z7960 Long term (current) use of unspecified immunomodulators and immunosuppressants: Secondary | ICD-10-CM | POA: Diagnosis not present

## 2021-08-15 DIAGNOSIS — R634 Abnormal weight loss: Secondary | ICD-10-CM

## 2021-08-15 DIAGNOSIS — Z9049 Acquired absence of other specified parts of digestive tract: Secondary | ICD-10-CM | POA: Diagnosis not present

## 2021-08-15 DIAGNOSIS — K50019 Crohn's disease of small intestine with unspecified complications: Secondary | ICD-10-CM

## 2021-08-15 LAB — CBC WITH DIFFERENTIAL/PLATELET
Basophils Absolute: 0 10*3/uL (ref 0.0–0.1)
Basophils Relative: 0.3 % (ref 0.0–3.0)
Eosinophils Absolute: 0.2 10*3/uL (ref 0.0–0.7)
Eosinophils Relative: 1.4 % (ref 0.0–5.0)
HCT: 49.5 % (ref 39.0–52.0)
Hemoglobin: 16 g/dL (ref 13.0–17.0)
Lymphocytes Relative: 22.9 % (ref 12.0–46.0)
Lymphs Abs: 2.8 10*3/uL (ref 0.7–4.0)
MCHC: 32.3 g/dL (ref 30.0–36.0)
MCV: 88.7 fl (ref 78.0–100.0)
Monocytes Absolute: 1.1 10*3/uL — ABNORMAL HIGH (ref 0.1–1.0)
Monocytes Relative: 8.6 % (ref 3.0–12.0)
Neutro Abs: 8.3 10*3/uL — ABNORMAL HIGH (ref 1.4–7.7)
Neutrophils Relative %: 66.8 % (ref 43.0–77.0)
Platelets: 217 10*3/uL (ref 150.0–400.0)
RBC: 5.59 Mil/uL (ref 4.22–5.81)
RDW: 13.6 % (ref 11.5–15.5)
WBC: 12.4 10*3/uL — ABNORMAL HIGH (ref 4.0–10.5)

## 2021-08-15 LAB — COMPREHENSIVE METABOLIC PANEL
ALT: 21 U/L (ref 0–53)
AST: 23 U/L (ref 0–37)
Albumin: 4.3 g/dL (ref 3.5–5.2)
Alkaline Phosphatase: 55 U/L (ref 39–117)
BUN: 12 mg/dL (ref 6–23)
CO2: 28 mEq/L (ref 19–32)
Calcium: 9.5 mg/dL (ref 8.4–10.5)
Chloride: 105 mEq/L (ref 96–112)
Creatinine, Ser: 1.04 mg/dL (ref 0.40–1.50)
GFR: 73.73 mL/min (ref >60.00)
Glucose, Bld: 73 mg/dL (ref 70–99)
Potassium: 4.3 mEq/L (ref 3.5–5.1)
Sodium: 141 mEq/L (ref 135–145)
Total Bilirubin: 0.6 mg/dL (ref 0.2–1.2)
Total Protein: 7.1 g/dL (ref 6.0–8.3)

## 2021-08-15 MED ORDER — DIPHENOXYLATE-ATROPINE 2.5-0.025 MG PO TABS: ORAL_TABLET | ORAL | 1 refills | Status: DC

## 2021-08-15 MED ORDER — HYOSCYAMINE SULFATE 0.125 MG SL SUBL: SUBLINGUAL_TABLET | SUBLINGUAL | 2 refills | Status: DC

## 2021-08-15 NOTE — Progress Notes (Signed)
[name] 71 y.o. April 10, 1951 [id number]  Assessment & Plan:   Encounter Diagnoses  Name Primary?   Crohn's disease of ileum with complication ([place]) Yes   Long-term use of immunosuppressant medication  - Entyvio    Loss of weight    H/O left hemicolectomy    S/P small bowel resection    So it sounds like he is losing response to Entyvio.  I am going to do lab work-up as below and symptomatic treatment as below.  He may need a colonoscopy but I want to understand what his Entyvio levels are and calprotectin and question any Entyvio antibodies.  CT enterography could potentially be another way to evaluate.  I will contact him with results and recommendations.     Orders Placed This Encounter  Procedures   Calprotectin, Fecal   CBC with Differential/Platelet   Comprehensive metabolic panel   QuantiFERON-TB Gold Plus   Vedolizumab and Anti-Vedo Ab    Meds ordered this encounter  Medications   diphenoxylate-atropine (LOMOTIL) 2.5-0.025 MG tablet    Sig: 1-2 tablets 4 times a day as neded    Dispense:  90 tablet    Refill:  1   hyoscyamine (LEVSIN SL) 0.125 MG SL tablet    Sig: PLACE 1 TABLET UNDER THE TONGUE EVERY 4 HOURS AS NEEDED FOR CRAMPING (URGENT DEFECATION)    Dispense:  60 tablet    Refill:  2   CC: [name], MD   Subjective:   Chief Complaint: Crohn's disease follow-up suspect flaring  HPI [name] is a 71 year old white man with Crohn's disease of the ileum and a history of multiple hernia repairs and mesh and left colon resection as well as small bowel resections (these were not related to Crohn's).  He was last seen in this office by my partner Dr. Havery Moros with a question of a flare though a CT scan was unrevealing except for some chronically mildly dilated small bowel, and was given a course of budesonide last year.  The patient reports a several month history of increasing stool frequency bilateral lower quadrant abdominal pain that feels like a  hernia though he has not seen any.  He worries about recurrent hernias given all the mesh he has in his body.  Stools 3-5 a day tend to come on later in the day and can disrupt the evening and nighttime though have not kept him from sleeping.  If he starts to have more than 1 stool a day he will take 2 Imodium and that helps.  He denies any bleeding.  He continues on Entyvio infusions every 8 weeks at [facility] rheumatology and is due for another 1 in 4 days.  His weight is coming down some he thinks unintentionally.  He did retire last year.  He is currently been on a very bland diet like the brat diet plus chicken and rice.  Psoriasis has been under good control since being on Entyvio.  Wt Readings from Last 3 Encounters:  08/15/21 208 lb (94.3 kg)  04/12/21 212 lb 9.6 oz (96.4 kg)  03/07/21 210 lb 3.2 oz (95.3 kg)   99kg 11/21   Allergies  Allergen Reactions   Mercaptopurine Other (See Comments)    Pancreatitis    Shellfish Allergy Anaphylaxis    Throat started to close   Humira [Adalimumab]     Developed antibodies   Wound Dressing Adhesive Rash   Current Meds  Medication Sig   albuterol (PROVENTIL HFA;VENTOLIN HFA) 108 (90  Base) MCG/ACT inhaler INHALE 2 PUFFS INTO THE LUNGS EVERY 4 (FOUR) HOURS AS NEEDED FOR WHEEZING OR SHORTNESS OF BREATH.   EPINEPHrine (EPIPEN 2-PAK) 0.3 mg/0.3 mL IJ SOAJ injection Inject 0.3 mLs (0.3 mg total) into the muscle as needed for anaphylaxis.   esomeprazole (NEXIUM) 20 MG capsule Take 20 mg by mouth daily as needed (acid reflux).   fluticasone (CUTIVATE) 0.05 % cream Apply 1 application topically 2 (two) times daily as needed for irritation.   halobetasol (ULTRAVATE) 0.05 % ointment Apply topically 2 (two) times daily.   ketoconazole (NIZORAL) 2 % cream APPLY TWICE A DAY AS NEEDED FOR FUNGAL INFECTION (Patient taking differently: Apply 1 application topically 2 (two) times daily as needed for irritation. APPLY TWICE A DAY AS NEEDED FOR FUNGAL INFECTION)    losartan (COZAAR) 25 MG tablet Take 1 tablet (25 mg total) by mouth daily.   Multiple Vitamins-Minerals (MULTIVITAMIN WITH MINERALS) tablet Take 1 tablet by mouth daily.   polyethylene glycol (MIRALAX) 17 g packet Take 17 g by mouth daily.   pramoxine-hydrocortisone (ANALPRAM-HC) 1-1 % rectal cream Place 1 application rectally 2 (two) times daily. Use twice daily before SITZ baths   tadalafil (CIALIS) 5 MG tablet Take 1 tablet (5 mg total) by mouth daily.   traZODone (DESYREL) 50 MG tablet Take 1 tablet (50 mg total) by mouth at bedtime.   Turmeric 500 MG CAPS Take 500 mg by mouth daily.   vedolizumab (ENTYVIO) 300 MG injection INFUSE 300 MG IV AT WEEK 6, THEN EVERY 8 WEEKS THEREAFTER.   [DISCONTINUED] diphenoxylate-atropine (LOMOTIL) 2.5-0.025 MG tablet 1-2 tablets 4 times a day as neded   [DISCONTINUED] hyoscyamine (LEVSIN SL) 0.125 MG SL tablet PLACE 1 TABLET UNDER THE TONGUE EVERY 4 HOURS AS NEEDED FOR CRAMPING (URGENT DEFECATION)   Past Medical History:  Diagnosis Date   Acute prostatitis 07/24/2007   Qualifier: Diagnosis of  By: Clent Ridges MD, Tera Mater    Allergy    mild   Arthritis    osteoarthritis   Asthma    Blood transfusion without reported diagnosis    BPH (benign prostatic hypertrophy) with urinary obstruction    Crohn's ileitis (HCC) suspected 05/03/2017   Dilated aortic root (HCC)    noted on echo 08/2012   Diverticulitis of colon    EPIDIDYMITIS 02/15/2010   Qualifier: Diagnosis of  By: Clent Ridges MD, Tera Mater    GERD (gastroesophageal reflux disease)    H/O: GI bleed    Hemorrhoids    Hepatitis 1975   unknown type    HERPES SIMPLEX INFECTION 10/14/2007   Qualifier: Diagnosis of  By: Clent Ridges MD, Jeannett Senior A    Hiatal hernia    Ileus following gastrointestinal surgery (HCC) 12/26/2011   Long term (current) use of systemic steroids 06/18/2018   Psoriasis    sees Dr. Karene Fry at [facility].   Recurrent ventral incisional hernia 05/10/2012   SVT (supraventricular tachycardia)  (HCC)    Ulcer 08/21/2013   ileal   Past Surgical History:  Procedure Laterality Date   BOWEL RESECTION  12/19/2011   Procedure: SMALL BOWEL RESECTION;  Surgeon: Mariella Saa, MD;  Location: [facility];  Service: General;  Laterality: N/A;  with anastamosis and insertion mesh   BRONCHOSCOPY     COLON SURGERY  01/2004   x 2   COLONOSCOPY W/ BIOPSIES  04/26/2017   per Dr. Leone Payor, no polyps, benign inflammation, repeat in 5 yrs    CYSTOSCOPY     ESOPHAGOGASTRODUODENOSCOPY  HEMICOLECTOMY     left side, at [facility], diverticulitis   HEMORRHOID BANDING     HERNIA REPAIR     [id number] incisional hernia   ILEOSTOMY     ILEOSTOMY CLOSURE     INSERTION OF MESH  07/31/2012   Procedure: INSERTION OF MESH;  Surgeon: [name], MD;  Location: [facility];  Service: General;;   LAPAROTOMY  12/19/2011   Procedure: EXPLORATORY LAPAROTOMY;  Surgeon: [name], MD;  Location: [facility];  Service: General;  Laterality: N/A;   PACEMAKER IMPLANT N/A 12/01/2020   Procedure: PACEMAKER IMPLANT;  Surgeon: [name], MD;  Location: [place];  Service: Cardiovascular;  Laterality: N/A;   PACEMAKER INSERTION Left    TONSILLECTOMY     UPPER GASTROINTESTINAL ENDOSCOPY     VASECTOMY     VENTRAL HERNIA REPAIR  07/31/2012   Procedure: HERNIA REPAIR VENTRAL ADULT;  Surgeon: [name], MD;  Location: [facility];  Service: General;  Laterality: N/A;   Social History   Social History Narrative   He is married with 2 sons 1 son is an Art gallery manager and 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   Review of Systems  As per HPI Objective:   Physical Exam BP 100/60    Pulse 93    Ht 5\' 10"  (1.778 m)    Wt 208 lb (94.3 kg)    BMI 29.84 kg/m  WDWN NAD Lungs cta Cor distant S1S2 no rmg Abd lwer midline scar - no hernias, mildly tender LLQ and RLQ to deep palpation almost groin area no hernia double checl Perianal mild eryhema

## 2021-08-15 NOTE — Patient Instructions (Signed)
Your provider has requested that you go to the basement level for lab work before leaving today. Press "B" on the elevator. The lab is located at the first door on the left as you exit the elevator.  Due to recent changes in healthcare laws, you may see the results of your imaging and laboratory studies on MyChart before your provider has had a chance to review them.  We understand that in some cases there may be results that are confusing or concerning to you. Not all laboratory results come back in the same time frame and the provider may be waiting for multiple results in order to interpret others.  Please give [place] 48 hours in order for your provider to thoroughly review all the results before contacting the office for clarification of your results.   We have sent the following medications to your pharmacy for you to pick up at your convenience: Hyoscyamine, lomotil   I appreciate the opportunity to care for you. Stan Head, MD, [facility]

## 2021-08-17 ENCOUNTER — Other Ambulatory Visit: Payer: Medicare Other

## 2021-08-17 DIAGNOSIS — Z7960 Long term (current) use of unspecified immunomodulators and immunosuppressants: Secondary | ICD-10-CM

## 2021-08-17 DIAGNOSIS — K50019 Crohn's disease of small intestine with unspecified complications: Secondary | ICD-10-CM

## 2021-08-18 ENCOUNTER — Other Ambulatory Visit: Payer: Self-pay | Admitting: Family Medicine

## 2021-08-18 ENCOUNTER — Other Ambulatory Visit: Payer: Self-pay | Admitting: Cardiovascular Disease

## 2021-08-18 LAB — QUANTIFERON-TB GOLD PLUS
Mitogen-NIL: >10 IU/mL
NIL: 0.13 IU/mL
QuantiFERON-TB Gold Plus: NEGATIVE
TB1-NIL: 0 IU/mL
TB2-NIL: 0.02 IU/mL

## 2021-08-20 LAB — CALPROTECTIN, FECAL: Calprotectin, Fecal: 697 ug/g — ABNORMAL HIGH (ref 0–120)

## 2021-08-23 LAB — VEDOLIZUMAB AND ANTI-VEDO AB
Anti-Vedolizumab Antibody: <25 ng/mL
Vedolizumab: 7.6 ug/mL

## 2021-08-25 ENCOUNTER — Other Ambulatory Visit: Payer: Self-pay

## 2021-08-25 DIAGNOSIS — K50018 Crohn's disease of small intestine with other complication: Secondary | ICD-10-CM

## 2021-08-25 MED ORDER — ENTYVIO 300 MG IV SOLR: INTRAVENOUS | 6 refills | Status: DC

## 2021-08-26 ENCOUNTER — Telehealth: Payer: Self-pay

## 2021-08-26 DIAGNOSIS — K50018 Crohn's disease of small intestine with other complication: Secondary | ICD-10-CM

## 2021-08-26 MED ORDER — ENTYVIO 300 MG IV SOLR: INTRAVENOUS | 6 refills | Status: DC

## 2021-08-26 NOTE — Telephone Encounter (Signed)
Received phone call from [place] at [place] office stating that unfortunately trying to get the [facility] every 4 weeks is "off Label"   meaning that they cant get authorization from the insurance company. Medicare will not cover this. Pt made aware: Prescription changed back to the every 8 week as previous prescribed. Please advise.

## 2021-08-30 ENCOUNTER — Encounter (HOSPITAL_BASED_OUTPATIENT_CLINIC_OR_DEPARTMENT_OTHER): Payer: Self-pay

## 2021-08-30 ENCOUNTER — Emergency Department (HOSPITAL_BASED_OUTPATIENT_CLINIC_OR_DEPARTMENT_OTHER)
Admission: EM | Admit: 2021-08-30 | Discharge: 2021-08-30 | Disposition: A | Discharge status: Home / Self Care | Payer: Medicare Other | Attending: Emergency Medicine | Admitting: Emergency Medicine

## 2021-08-30 ENCOUNTER — Emergency Department (HOSPITAL_BASED_OUTPATIENT_CLINIC_OR_DEPARTMENT_OTHER): Payer: Medicare Other

## 2021-08-30 ENCOUNTER — Telehealth: Payer: Self-pay | Admitting: Internal Medicine

## 2021-08-30 ENCOUNTER — Other Ambulatory Visit: Payer: Self-pay

## 2021-08-30 DIAGNOSIS — R110 Nausea: Secondary | ICD-10-CM | POA: Insufficient documentation

## 2021-08-30 DIAGNOSIS — Z79899 Other long term (current) drug therapy: Secondary | ICD-10-CM | POA: Insufficient documentation

## 2021-08-30 DIAGNOSIS — R1084 Generalized abdominal pain: Secondary | ICD-10-CM | POA: Insufficient documentation

## 2021-08-30 DIAGNOSIS — K5090 Crohn's disease, unspecified, without complications: Secondary | ICD-10-CM | POA: Diagnosis not present

## 2021-08-30 DIAGNOSIS — R197 Diarrhea, unspecified: Secondary | ICD-10-CM | POA: Diagnosis not present

## 2021-08-30 LAB — URINALYSIS, MICROSCOPIC (REFLEX)

## 2021-08-30 LAB — COMPREHENSIVE METABOLIC PANEL
ALT: 20 U/L (ref 0–44)
AST: 34 U/L (ref 15–41)
Albumin: 4.2 g/dL (ref 3.5–5.0)
Alkaline Phosphatase: 51 U/L (ref 38–126)
Anion gap: 9 (ref 5–15)
BUN: 13 mg/dL (ref 8–23)
CO2: 21 mmol/L — ABNORMAL LOW (ref 22–32)
Calcium: 8.9 mg/dL (ref 8.9–10.3)
Chloride: 106 mmol/L (ref 98–111)
Creatinine, Ser: 1.04 mg/dL (ref 0.61–1.24)
GFR, Estimated: >60 mL/min (ref >60)
Glucose, Bld: 97 mg/dL (ref 70–99)
Potassium: 4.3 mmol/L (ref 3.5–5.1)
Sodium: 136 mmol/L (ref 135–145)
Total Bilirubin: 0.9 mg/dL (ref 0.3–1.2)
Total Protein: 7.3 g/dL (ref 6.5–8.1)

## 2021-08-30 LAB — CBC WITH DIFFERENTIAL/PLATELET
Abs Immature Granulocytes: 0.06 10*3/uL (ref 0.00–0.07)
Basophils Absolute: 0 10*3/uL (ref 0.0–0.1)
Basophils Relative: 0 %
Eosinophils Absolute: 0.2 10*3/uL (ref 0.0–0.5)
Eosinophils Relative: 1 %
HCT: 50 % (ref 39.0–52.0)
Hemoglobin: 16.7 g/dL (ref 13.0–17.0)
Immature Granulocytes: 1 %
Lymphocytes Relative: 24 %
Lymphs Abs: 3.2 10*3/uL (ref 0.7–4.0)
MCH: 29.3 pg (ref 26.0–34.0)
MCHC: 33.4 g/dL (ref 30.0–36.0)
MCV: 87.7 fL (ref 80.0–100.0)
Monocytes Absolute: 0.9 10*3/uL (ref 0.1–1.0)
Monocytes Relative: 7 %
Neutro Abs: 9 10*3/uL — ABNORMAL HIGH (ref 1.7–7.7)
Neutrophils Relative %: 67 %
Platelets: 240 10*3/uL (ref 150–400)
RBC: 5.7 MIL/uL (ref 4.22–5.81)
RDW: 13.3 % (ref 11.5–15.5)
WBC: 13.3 10*3/uL — ABNORMAL HIGH (ref 4.0–10.5)
nRBC: 0 % (ref 0.0–0.2)

## 2021-08-30 LAB — LIPASE, BLOOD: Lipase: 30 U/L (ref 11–51)

## 2021-08-30 LAB — URINALYSIS, ROUTINE W REFLEX MICROSCOPIC
Bilirubin Urine: NEGATIVE
Glucose, UA: NEGATIVE mg/dL
Hgb urine dipstick: NEGATIVE
Ketones, ur: NEGATIVE mg/dL
Leukocytes,Ua: NEGATIVE
Nitrite: NEGATIVE
Protein, ur: 30 mg/dL — AB
Specific Gravity, Urine: >1.03 — ABNORMAL HIGH (ref 1.005–1.030)
pH: 6 (ref 5.0–8.0)

## 2021-08-30 MED ORDER — IOHEXOL 300 MG/ML  SOLN
100.0000 mL | Freq: Once | INTRAMUSCULAR | Status: AC | PRN
  Administered 2021-08-30: 100 mL via INTRAVENOUS

## 2021-08-30 MED ORDER — ONDANSETRON HCL 4 MG/2ML IJ SOLN
4.0000 mg | Freq: Once | INTRAMUSCULAR | Status: AC
  Administered 2021-08-30: 4 mg via INTRAVENOUS
  Filled 2021-08-30: qty 2

## 2021-08-30 MED ORDER — MORPHINE SULFATE (PF) 4 MG/ML IV SOLN
4.0000 mg | Freq: Once | INTRAVENOUS | Status: AC
  Administered 2021-08-30: 4 mg via INTRAVENOUS
  Filled 2021-08-30: qty 1

## 2021-08-30 MED ORDER — ONDANSETRON 4 MG PO TBDP: ORAL_TABLET | ORAL | 0 refills | Status: DC

## 2021-08-30 MED ORDER — SODIUM CHLORIDE 0.9 % IV BOLUS
1000.0000 mL | Freq: Once | INTRAVENOUS | Status: AC
Start: 2021-08-30 — End: 2021-08-30
  Administered 2021-08-30: 1000 mL via INTRAVENOUS

## 2021-08-30 MED ORDER — METHYLPREDNISOLONE SODIUM SUCC 125 MG IJ SOLR
60.0000 mg | Freq: Once | INTRAMUSCULAR | Status: AC
  Administered 2021-08-30: 60 mg via INTRAVENOUS
  Filled 2021-08-30: qty 2

## 2021-08-30 MED ORDER — MORPHINE SULFATE 15 MG PO TABS
7.5000 mg | ORAL_TABLET | ORAL | 0 refills | Status: DC | PRN
Start: 2021-08-30 — End: 2021-09-21

## 2021-08-30 NOTE — Telephone Encounter (Signed)
Pt stated that he has been in sever pain since yesterday with Constant abdominal cramping; Continuous throughout night; Last BM 2 days ago; feeling nauseated, No emesis: Has Crohn's Please Advise

## 2021-08-30 NOTE — Telephone Encounter (Signed)
I think he should get evaluated at an ED He could try [facility] vs [address] ([place])

## 2021-08-30 NOTE — ED Notes (Signed)
Pt A&OX4 ambulatory at d/c with independent steady gait, NAD. Pt verbalized understanding of d/c instructions, prescriptions and follow up care. 

## 2021-08-30 NOTE — Discharge Instructions (Addendum)
Your gastroenterologist should call you tomorrow morning.  Please return to the emergency department for worsening pain fever or inability to eat or drink.

## 2021-08-30 NOTE — ED Notes (Signed)
Pt ambulatory to room with independent steady gait 

## 2021-08-30 NOTE — ED Provider Notes (Signed)
[facility] EMERGENCY DEPARTMENT Provider Note   CSN: [id number] Arrival date & time: 08/30/21  1514     History  Chief Complaint  Patient presents with   Abdominal Pain    [name] is a 71 y.o. male.  71 yo M with a significant past medical history of Crohn's disease comes in with a chief complaints of abdominal discomfort.  He had had diarrhea a couple days to the weekend which resolved with prescription medicine.  Since then has not had a bowel movement.  Some nausea but no vomiting.  Having crampy abdominal discomfort.  Rated as an 8 out of 10.  Has gotten a little bit better over the past 24 hours.  He called his gastroenterologist Dr. Arelia Longest who suggested he come to the ER for evaluation.   Abdominal Pain     Home Medications Prior to Admission medications   Medication Sig Start Date End Date Taking? Authorizing Provider  morphine (MSIR) 15 MG tablet Take 0.5 tablets (7.5 mg total) by mouth every 4 (four) hours as needed for severe pain. 08/30/21  Yes Deno Etienne, DO  ondansetron (ZOFRAN-ODT) 4 MG disintegrating tablet 4mg  ODT q4 hours prn nausea/vomit 08/30/21  Yes Tyrone Nine, Cheetara Hoge, DO  albuterol (PROVENTIL HFA;VENTOLIN HFA) 108 (90 Base) MCG/ACT inhaler INHALE 2 PUFFS INTO THE LUNGS EVERY 4 (FOUR) HOURS AS NEEDED FOR WHEEZING OR SHORTNESS OF BREATH. 10/24/18   [name], MD  diphenoxylate-atropine (LOMOTIL) 2.5-0.025 MG tablet 1-2 tablets 4 times a day as neded 08/15/21   Gatha Mayer, MD  EPINEPHrine (EPIPEN 2-PAK) 0.3 mg/0.3 mL IJ SOAJ injection Inject 0.3 mLs (0.3 mg total) into the muscle as needed for anaphylaxis. 11/09/18   Sherwood Gambler, MD  esomeprazole (NEXIUM) 20 MG capsule Take 20 mg by mouth daily as needed (acid reflux).    [provider]  fluticasone (CUTIVATE) 0.05 % cream Apply 1 application topically 2 (two) times daily as needed for irritation. 11/02/20   [provider]  halobetasol (ULTRAVATE) 0.05 % ointment Apply  topically 2 (two) times daily. 01/08/18   [name], MD  hyoscyamine (LEVSIN SL) 0.125 MG SL tablet PLACE 1 TABLET UNDER THE TONGUE EVERY 4 HOURS AS NEEDED FOR CRAMPING (URGENT DEFECATION) 08/15/21   Gatha Mayer, MD  ketoconazole (NIZORAL) 2 % cream APPLY TWICE A DAY AS NEEDED FOR FUNGAL INFECTION Patient taking differently: Apply 1 application topically 2 (two) times daily as needed for irritation. APPLY TWICE A DAY AS NEEDED FOR FUNGAL INFECTION 08/16/20   [name], MD  losartan (COZAAR) 25 MG tablet TAKE 1 TABLET DAILY 08/18/21   Josue Hector, MD  Multiple Vitamins-Minerals (MULTIVITAMIN WITH MINERALS) tablet Take 1 tablet by mouth daily.    [provider]  polyethylene glycol (MIRALAX) 17 g packet Take 17 g by mouth daily. 01/04/21   Armbruster, Carlota Raspberry, MD  pramoxine-hydrocortisone [facility]) 1-1 % rectal cream Place 1 application rectally 2 (two) times daily. Use twice daily before SITZ baths 04/21/19   Milus Banister, MD  tadalafil (CIALIS) 5 MG tablet Take 1 tablet (5 mg total) by mouth daily. 11/23/17   [name], MD  traZODone (DESYREL) 50 MG tablet Take 1 tablet (50 mg total) by mouth at bedtime. 04/12/21   [name], MD  Turmeric 500 MG CAPS Take 500 mg by mouth daily.    [provider]  vedolizumab (ENTYVIO) 300 MG injection INFUSE 300 MG IV every 8 weeks 08/26/21  Gatha Mayer, MD      Allergies    Mercaptopurine, Shellfish allergy, Humira [adalimumab], and Wound dressing adhesive    Review of Systems   Review of Systems  Gastrointestinal:  Positive for abdominal pain.   Physical Exam Updated Vital Signs BP 139/67    Pulse (!) 59    Temp 97.6 F (36.4 C) (Oral)    Resp 16    Ht 5\' 10"  (1.778 m)    Wt 93.4 kg    SpO2 99%    BMI 29.56 kg/m  Physical Exam Vitals and nursing note reviewed.  Constitutional:      Appearance: He is well-developed.  HENT:     Head: Normocephalic and atraumatic.  Eyes:     Pupils: Pupils are equal,  round, and reactive to light.  Neck:     Vascular: No JVD.  Cardiovascular:     Rate and Rhythm: Normal rate and regular rhythm.     Heart sounds: No murmur heard.   No friction rub. No gallop.  Pulmonary:     Effort: No respiratory distress.     Breath sounds: No wheezing.  Abdominal:     General: There is no distension.     Tenderness: There is abdominal tenderness. There is no guarding or rebound.     Comments: Multiple abdominal scars.  Diffuse abdominal discomfort without obvious focality.  Mild distention.  Tympanitic to percussion.  Musculoskeletal:        General: Normal range of motion.     Cervical back: Normal range of motion and neck supple.  Skin:    Coloration: Skin is not pale.     Findings: No rash.  Neurological:     Mental Status: He is alert and oriented to person, place, and time.  Psychiatric:        Behavior: Behavior normal.    ED Results / Procedures / Treatments   Labs (all labs ordered are listed, but only abnormal results are displayed) Labs Reviewed  CBC WITH DIFFERENTIAL/PLATELET - Abnormal; Notable for the following components:      Result Value   WBC 13.3 (*)    Neutro Abs 9.0 (*)    All other components within normal limits  COMPREHENSIVE METABOLIC PANEL - Abnormal; Notable for the following components:   CO2 21 (*)    All other components within normal limits  URINALYSIS, ROUTINE W REFLEX MICROSCOPIC - Abnormal; Notable for the following components:   Color, Urine AMBER (*)    Specific Gravity, Urine >1.030 (*)    Protein, ur 30 (*)    All other components within normal limits  URINALYSIS, MICROSCOPIC (REFLEX) - Abnormal; Notable for the following components:   Bacteria, UA FEW (*)    All other components within normal limits  LIPASE, BLOOD    EKG None  Radiology CT ABDOMEN PELVIS W CONTRAST  Result Date: 08/30/2021 CLINICAL DATA:  Abdominal pain EXAM: CT ABDOMEN AND PELVIS WITH CONTRAST TECHNIQUE: Multidetector CT imaging of the  abdomen and pelvis was performed using the standard protocol following bolus administration of intravenous contrast. RADIATION DOSE REDUCTION: This exam was performed according to the departmental dose-optimization program which includes automated exposure control, adjustment of the mA and/or kV according to patient size and/or use of iterative reconstruction technique. CONTRAST:  170mL OMNIPAQUE IOHEXOL 300 MG/ML  SOLN COMPARISON:  01/06/2021 FINDINGS: Lower chest: There is pleural thickening along with small linear density in the posterolateral aspect of right lower lung fields with no significant interval  change. Pacemaker leads are noted in the heart. Hepatobiliary: There is fatty infiltration in the liver. Gallbladder is unremarkable. Pancreas: No focal abnormality is seen. Spleen: Unremarkable. Adrenals/Urinary Tract: Adrenals are not enlarged. There is no hydronephrosis. There are no renal or ureteral stones. Urinary bladder is not distended. Stomach/Bowel: Small hiatal hernia is seen. Stomach is not distended. Proximal small bowel loops are not dilated. There is dilation of distal small bowel loops especially in the right lower abdomen measuring up to 9.1 cm in diameter with air-fluid level. There is interval worsening of this finding. Surgical staples are seen in the right side of the dilated small bowel loop. There are few other dilated small bowel loops in the left lower quadrant measuring up to 4.4 cm in diameter. Appendix is not dilated. Scattered diverticula are seen in the colon. There is evidence of left hemicolectomy. Surgical staples are seen in the sigmoid. There is no new significant wall thickening in the colon. There is no pericolic stranding. Vascular/Lymphatic: Unremarkable. Reproductive: Prostate is enlarged with coarse calcifications. Other: There is no ascites or pneumoperitoneum. Bilateral inguinal hernias containing fat are seen. Musculoskeletal: Unremarkable. IMPRESSION: There is  previous small bowel anastomosis in the right lower quadrant of abdomen. There is dilation of small bowel loop immediately adjacent to surgical staples in the small bowel in the right lower quadrant measuring up to 9.1 cm in diameter with air-fluid level. There are few other less dilated small bowel loops in the left lower quadrant of abdomen. This may suggest chronic post surgical ileus or stricture at ileo ileal anastomosis. Proximal small bowel loops are not dilated. There is no pneumoperitoneum. There is no ascites. There is previous left hemicolectomy. Fatty liver. Small hiatal hernia. Enlarged prostate. Small bilateral inguinal hernias containing fat. Other findings as described in the body of the report. Electronically Signed   By: Elmer Picker M.D.   On: 08/30/2021 17:00    Procedures Procedures    Medications Ordered in ED Medications  sodium chloride 0.9 % bolus 1,000 mL (0 mLs Intravenous Stopped 08/30/21 1751)  morphine (PF) 4 MG/ML injection 4 mg (4 mg Intravenous Given 08/30/21 1607)  ondansetron (ZOFRAN) injection 4 mg (4 mg Intravenous Given 08/30/21 1606)  iohexol (OMNIPAQUE) 300 MG/ML solution 100 mL (100 mLs Intravenous Contrast Given 08/30/21 1630)  methylPREDNISolone sodium succinate (SOLU-MEDROL) 125 mg/2 mL injection 60 mg (60 mg Intravenous Given 08/30/21 1748)    ED Course/ Medical Decision Making/ A&P                           Medical Decision Making Amount and/or Complexity of Data Reviewed Labs: ordered. Radiology: ordered.  Risk Prescription drug management.   71 yo M with a chief complaints of abdominal pain.  Going on for about 3 days now.  Has a history of Crohn's and is not been doing well with a recent medication change.  Has seen his GI doctor about a week ago.  With his worsening pain he called his gastroenterology office is just to come to the ER for evaluation.  On my exam he has diffuse abdominal discomfort.  Multiple abdominal scars.  We will  obtain blood work and CT imaging.  Treat pain and nausea.  Reassess.  Lab work at baseline.  No significant electrolyte abnormality no significant renal dysfunction no significant anemia.  UA individually interpreted by me without infection.  CT scan of the abdomen pelvis with an isolated dilated bowel loop.  Radiology read with possible ileus.  I discussed the case with the patient's gastroenterologist, [name] recommended dose of steroids here and he will call the patient in the morning.  Will discharge with a short course of narcotics.  6:53 PM:  I have discussed the diagnosis/risks/treatment options with the patient.  Evaluation and diagnostic testing in the emergency department does not suggest an emergent condition requiring admission or immediate intervention beyond what has been performed at this time.  They will follow up with  GI. We also discussed returning to the ED immediately if new or worsening sx occur. We discussed the sx which are most concerning (e.g., sudden worsening pain, fever, inability to tolerate by mouth) that necessitate immediate return. Medications administered to the patient during their visit and any new prescriptions provided to the patient are listed below.  Medications given during this visit Medications  sodium chloride 0.9 % bolus 1,000 mL (0 mLs Intravenous Stopped 08/30/21 1751)  morphine (PF) 4 MG/ML injection 4 mg (4 mg Intravenous Given 08/30/21 1607)  ondansetron (ZOFRAN) injection 4 mg (4 mg Intravenous Given 08/30/21 1606)  iohexol (OMNIPAQUE) 300 MG/ML solution 100 mL (100 mLs Intravenous Contrast Given 08/30/21 1630)  methylPREDNISolone sodium succinate (SOLU-MEDROL) 125 mg/2 mL injection 60 mg (60 mg Intravenous Given 08/30/21 1748)     The patient appears reasonably screen and/or stabilized for discharge and I doubt any other medical condition or other [facility] requiring further screening, evaluation, or treatment in the ED at this time prior to discharge.           Final Clinical Impression(s) / ED Diagnoses Final diagnoses:  Generalized abdominal pain    Rx / DC Orders ED Discharge Orders          Ordered    morphine (MSIR) 15 MG tablet  Every 4 hours PRN        08/30/21 1741    ondansetron (ZOFRAN-ODT) 4 MG disintegrating tablet        08/30/21 1741              Deno Etienne, DO 08/30/21 1853

## 2021-08-30 NOTE — ED Notes (Signed)
Dr. Floyd at bedside. 

## 2021-08-30 NOTE — ED Triage Notes (Signed)
Pt c/o abd camping x 24 hours-no BM x 2 days-states he was seen by GI ~1-2 weeks ago-called office today and was advised to come to the ED-NAD-steady gait

## 2021-08-30 NOTE — Telephone Encounter (Signed)
Inbound call from patient stating he has been having severe been since last night.  Please advise.

## 2021-08-30 NOTE — Telephone Encounter (Signed)
Pt made aware of Dr. Gessner recommendations: Pt verbalized understanding with all questions answered.   

## 2021-08-31 ENCOUNTER — Other Ambulatory Visit: Payer: Self-pay

## 2021-08-31 ENCOUNTER — Encounter: Payer: Self-pay | Admitting: Internal Medicine

## 2021-08-31 DIAGNOSIS — K50018 Crohn's disease of small intestine with other complication: Secondary | ICD-10-CM

## 2021-08-31 MED ORDER — PREDNISONE 10 MG PO TABS
ORAL_TABLET | ORAL | 0 refills | Status: AC
Start: 2021-08-31 — End: 2021-09-16

## 2021-08-31 NOTE — Telephone Encounter (Signed)
Patient was in the emergency department meant yesterday at my direction.  I spoke to the ER physician about things and I have just spoken to the patient.  He had developed increasing abdominal pain.  It apparently started after a flare of diarrhea that he treated with some Lomotil.  And then he became constipated and then things intensified.  We know his Crohn's is not under control based upon elevated fecal calprotectin and symptoms.  CT scan showed some increased dilation of small bowel loops.  In the vicinity of prior surgery.  He was given 60 mg of Solu-Medrol.  He feels significantly better with some slight to mild tenderness but much better abdominal pain.  I had asked that we increase his Entyvio to every 4 weeks based upon low trough levels and no antibodies but the response from his infusion center was is that that was off-label and would not be approved.  Here are my plans and recommendations that I have discussed with the patient:  3-week steroid taper I sent in prescription  Schedule colonoscopy to evaluate his Crohn's disease, question could he have stricturing in the ileocecal area, seems unlikely but possible  Consider surgical evaluation given possible adhesion/mesh contribution await colonoscopy   He is asking about a dietitian referral I will send him some online dietitian possibilities and we will also look into local versus tertiary center dietitians.  Regarding the Entyvio infusion every 4 weeks my experience is there are patients on Medicare that are able to do this.  We will revisit this.  May need to change infusion centers.

## 2021-08-31 NOTE — Telephone Encounter (Addendum)
Please double check with [facility] [employer].  I know I have patients on Medicare that are able to get it every 4 weeks.  I am aware that is not the original FDA approved dosing but we have been able to get that done for some patients.  I also understand the people have different secondary insurances.   I just want to make sure that they submitted it to see and if there is any letter or prior authorization help that I can provide I will.  Thanks

## 2021-09-01 NOTE — Telephone Encounter (Signed)
Pt was made aware of the prescription that was sent in By [name]:   Pt scheduled for Colonoscopy 09/20/2021 at 2:00 with [name] in the [facility]: Pt to arrive at 1:00: Prep instructions created for pt and sent to pt via My Chart. GI referral placed in Epic  Referral was faxed  to [facility] for a dietitian with expertise in Crohn's disease: Pt made aware:  Lori from [facility] was contacted and requested that this request for the Increase in the Entyvio to every 4 weeks be revisited and to see what can be done to make this happen. Cecille Rubin stated that Dr. Amil Amen was out of the office until next Tuesday and she will bring this to his attention once more.

## 2021-09-01 NOTE — Telephone Encounter (Signed)
Pt was made aware of the prescription that was sent in By [name]:    Pt scheduled for Colonoscopy 09/20/2021 at 2:00 with [name] in the [facility]: Pt to arrive at 1:00: Prep instructions created for pt and sent to pt via My Chart. GI referral placed in Epic   Referral was faxed  to [facility] for a dietitian with expertise in Crohn's disease: Pt made aware:   Lori from [facility] was contacted and requested that this request for the Increase in the Entyvio to every 4 weeks be revisited and to see what can be done to make this happen. Cecille Rubin stated that Dr. Amil Amen was out of the office until next Tuesday and she will bring this to his attention once more.

## 2021-09-01 NOTE — Telephone Encounter (Signed)
[facility] office called and Message left for Cecille Rubin to return Call: Direct number provided:

## 2021-09-02 ENCOUNTER — Ambulatory Visit (INDEPENDENT_AMBULATORY_CARE_PROVIDER_SITE_OTHER): Payer: Medicare Other

## 2021-09-02 DIAGNOSIS — I495 Sick sinus syndrome: Secondary | ICD-10-CM

## 2021-09-02 LAB — CUP PACEART REMOTE DEVICE CHECK
Battery Remaining Longevity: 123 mo
Battery Remaining Percentage: 95.5 %
Battery Voltage: 3.01 V
Brady Statistic AP VP Percent: 1 %
Brady Statistic AP VS Percent: 66 %
Brady Statistic AS VP Percent: 1 %
Brady Statistic AS VS Percent: 33 %
Brady Statistic RA Percent Paced: 66 %
Brady Statistic RV Percent Paced: 1 %
Date Time Interrogation Session: 20230217020013
Implantable Lead Implant Date: 20220518
Implantable Lead Implant Date: 20220518
Implantable Lead Location: 753859
Implantable Lead Location: 753860
Implantable Lead Model: 1944
Implantable Pulse Generator Implant Date: 20220518
Lead Channel Impedance Value: 490 Ohm
Lead Channel Impedance Value: 550 Ohm
Lead Channel Pacing Threshold Amplitude: 0.5 V
Lead Channel Pacing Threshold Amplitude: 0.5 V
Lead Channel Pacing Threshold Pulse Width: 0.5 ms
Lead Channel Pacing Threshold Pulse Width: 0.5 ms
Lead Channel Sensing Intrinsic Amplitude: 5 mV
Lead Channel Sensing Intrinsic Amplitude: 5 mV
Lead Channel Setting Pacing Amplitude: 0.75 V
Lead Channel Setting Pacing Amplitude: 1.5 V
Lead Channel Setting Pacing Pulse Width: 0.5 ms
Lead Channel Setting Sensing Sensitivity: 2 mV
Pulse Gen Model: 2272
Pulse Gen Serial Number: 3925308

## 2021-09-08 NOTE — Progress Notes (Signed)
Remote pacemaker transmission.   

## 2021-09-12 ENCOUNTER — Telehealth: Payer: Self-pay

## 2021-09-12 NOTE — Telephone Encounter (Signed)
Lori from [facility] was contacted and requested that this request for the Increase in the Entyvio to every 4 weeks be revisited and to see what can be done to make this happen. Cecille Rubin stated that Dr. Amil Amen was not comfortable doing this and that if they were audited then this could be a problem:

## 2021-09-13 ENCOUNTER — Encounter: Payer: Self-pay | Admitting: Internal Medicine

## 2021-09-13 NOTE — Telephone Encounter (Signed)
Inbound call Almira Coaster from [employer] in [place]. States patient have Medicare Part D and OOP for [facility] would be $2232.87 and they can not offer a copay card. She would like a call back to discuss options. Best contact number [phone] option 1 ask for Gina.

## 2021-09-13 NOTE — Telephone Encounter (Signed)
Please let patient know I am still working on this - likely going to switch infusion centers and/or medication

## 2021-09-14 ENCOUNTER — Telehealth: Payer: Self-pay | Admitting: Pharmacy Technician

## 2021-09-14 ENCOUNTER — Other Ambulatory Visit: Payer: Self-pay | Admitting: Pharmacy Technician

## 2021-09-14 NOTE — Telephone Encounter (Signed)
Auth Submission: no pa needed ?Payer: medicare A/B / AARP ?Medication & CPT/J Code(s) submitted: Entyvio (Vedolizumab) [id number] ?Route of submission (phone, fax, portal): PHONE ?Auth type: Buy/Bill ?Units/visits requested: entvio [id number] [id number] ?Reference number: [id number]?Approval from: 09/14/21 to 06/18/22  ? ?AARP - [id number]?No PA needed ?Rep: AAndi Devon3/1/23 ? ?Medicare will cover 80% and supplement will pick-up remaining 20%. ?Deductible = $226 ? ?@[email or web address] , ?Please enter therapy plan for patient. ?Ty. ? ?Patient will be scheduled as soon as possible. ?

## 2021-09-15 NOTE — Telephone Encounter (Signed)
[facility].T  at [facility] made aware of this through secure chat as this is something that she has been working on.  ?

## 2021-09-20 ENCOUNTER — Telehealth: Payer: Self-pay | Admitting: Gastroenterology

## 2021-09-20 ENCOUNTER — Encounter (HOSPITAL_COMMUNITY): Payer: Self-pay | Admitting: *Deleted

## 2021-09-20 ENCOUNTER — Emergency Department (HOSPITAL_COMMUNITY): Payer: Medicare Other

## 2021-09-20 ENCOUNTER — Other Ambulatory Visit: Payer: Self-pay

## 2021-09-20 ENCOUNTER — Ambulatory Visit (AMBULATORY_SURGERY_CENTER): Payer: Medicare Other | Admitting: Internal Medicine

## 2021-09-20 ENCOUNTER — Encounter: Payer: Self-pay | Admitting: Internal Medicine

## 2021-09-20 ENCOUNTER — Telehealth: Payer: Self-pay

## 2021-09-20 ENCOUNTER — Inpatient Hospital Stay (HOSPITAL_COMMUNITY)
Admission: EM | Admit: 2021-09-20 | Discharge: 2021-09-24 | DRG: 872 | Disposition: A | Discharge status: Home / Self Care | Payer: Medicare Other | Attending: Internal Medicine | Admitting: Internal Medicine

## 2021-09-20 VITALS — BP 118/66 | HR 60 | Temp 97.3°F | Resp 14 | Ht 70.0 in | Wt 208.0 lb

## 2021-09-20 DIAGNOSIS — K5000 Crohn's disease of small intestine without complications: Secondary | ICD-10-CM | POA: Diagnosis present

## 2021-09-20 DIAGNOSIS — J45909 Unspecified asthma, uncomplicated: Secondary | ICD-10-CM | POA: Diagnosis present

## 2021-09-20 DIAGNOSIS — D696 Thrombocytopenia, unspecified: Secondary | ICD-10-CM | POA: Diagnosis present

## 2021-09-20 DIAGNOSIS — Z9049 Acquired absence of other specified parts of digestive tract: Secondary | ICD-10-CM

## 2021-09-20 DIAGNOSIS — Z8042 Family history of malignant neoplasm of prostate: Secondary | ICD-10-CM

## 2021-09-20 DIAGNOSIS — Z6829 Body mass index (BMI) 29.0-29.9, adult: Secondary | ICD-10-CM

## 2021-09-20 DIAGNOSIS — K5730 Diverticulosis of large intestine without perforation or abscess without bleeding: Secondary | ICD-10-CM | POA: Diagnosis not present

## 2021-09-20 DIAGNOSIS — Z79899 Other long term (current) drug therapy: Secondary | ICD-10-CM

## 2021-09-20 DIAGNOSIS — Z888 Allergy status to other drugs, medicaments and biological substances status: Secondary | ICD-10-CM

## 2021-09-20 DIAGNOSIS — M1990 Unspecified osteoarthritis, unspecified site: Secondary | ICD-10-CM | POA: Diagnosis present

## 2021-09-20 DIAGNOSIS — K219 Gastro-esophageal reflux disease without esophagitis: Secondary | ICD-10-CM | POA: Diagnosis present

## 2021-09-20 DIAGNOSIS — K5900 Constipation, unspecified: Secondary | ICD-10-CM | POA: Diagnosis present

## 2021-09-20 DIAGNOSIS — Z8249 Family history of ischemic heart disease and other diseases of the circulatory system: Secondary | ICD-10-CM

## 2021-09-20 DIAGNOSIS — Z950 Presence of cardiac pacemaker: Secondary | ICD-10-CM

## 2021-09-20 DIAGNOSIS — R634 Abnormal weight loss: Secondary | ICD-10-CM

## 2021-09-20 DIAGNOSIS — E669 Obesity, unspecified: Secondary | ICD-10-CM | POA: Diagnosis present

## 2021-09-20 DIAGNOSIS — Z806 Family history of leukemia: Secondary | ICD-10-CM

## 2021-09-20 DIAGNOSIS — A419 Sepsis, unspecified organism: Secondary | ICD-10-CM | POA: Diagnosis not present

## 2021-09-20 DIAGNOSIS — Z20822 Contact with and (suspected) exposure to covid-19: Secondary | ICD-10-CM | POA: Diagnosis present

## 2021-09-20 DIAGNOSIS — K50018 Crohn's disease of small intestine with other complication: Secondary | ICD-10-CM | POA: Diagnosis not present

## 2021-09-20 DIAGNOSIS — N400 Enlarged prostate without lower urinary tract symptoms: Secondary | ICD-10-CM | POA: Diagnosis present

## 2021-09-20 DIAGNOSIS — K5080 Crohn's disease of both small and large intestine without complications: Secondary | ICD-10-CM | POA: Diagnosis present

## 2021-09-20 DIAGNOSIS — R110 Nausea: Principal | ICD-10-CM

## 2021-09-20 DIAGNOSIS — R1084 Generalized abdominal pain: Secondary | ICD-10-CM

## 2021-09-20 DIAGNOSIS — Z91013 Allergy to seafood: Secondary | ICD-10-CM

## 2021-09-20 DIAGNOSIS — Z801 Family history of malignant neoplasm of trachea, bronchus and lung: Secondary | ICD-10-CM

## 2021-09-20 DIAGNOSIS — E876 Hypokalemia: Secondary | ICD-10-CM | POA: Diagnosis present

## 2021-09-20 LAB — CBC WITH DIFFERENTIAL/PLATELET
Abs Immature Granulocytes: 0.06 10*3/uL (ref 0.00–0.07)
Basophils Absolute: 0 10*3/uL (ref 0.0–0.1)
Basophils Relative: 0 %
Eosinophils Absolute: 0 10*3/uL (ref 0.0–0.5)
Eosinophils Relative: 0 %
HCT: 50.7 % (ref 39.0–52.0)
Hemoglobin: 16.5 g/dL (ref 13.0–17.0)
Immature Granulocytes: 0 %
Lymphocytes Relative: 3 %
Lymphs Abs: 0.5 10*3/uL — ABNORMAL LOW (ref 0.7–4.0)
MCH: 28.8 pg (ref 26.0–34.0)
MCHC: 32.5 g/dL (ref 30.0–36.0)
MCV: 88.6 fL (ref 80.0–100.0)
Monocytes Absolute: 0.5 10*3/uL (ref 0.1–1.0)
Monocytes Relative: 3 %
Neutro Abs: 14.6 10*3/uL — ABNORMAL HIGH (ref 1.7–7.7)
Neutrophils Relative %: 94 %
Platelets: 185 10*3/uL (ref 150–400)
RBC: 5.72 MIL/uL (ref 4.22–5.81)
RDW: 13.2 % (ref 11.5–15.5)
WBC: 15.7 10*3/uL — ABNORMAL HIGH (ref 4.0–10.5)
nRBC: 0 % (ref 0.0–0.2)

## 2021-09-20 LAB — URINALYSIS, ROUTINE W REFLEX MICROSCOPIC
Bacteria, UA: NONE SEEN
Bilirubin Urine: NEGATIVE
Glucose, UA: NEGATIVE mg/dL
Hgb urine dipstick: NEGATIVE
Ketones, ur: 5 mg/dL — AB
Nitrite: NEGATIVE
Protein, ur: 100 mg/dL — AB
Specific Gravity, Urine: 1.026 (ref 1.005–1.030)
pH: 5 (ref 5.0–8.0)

## 2021-09-20 LAB — COMPREHENSIVE METABOLIC PANEL
ALT: 25 U/L (ref 0–44)
AST: 38 U/L (ref 15–41)
Albumin: 4.3 g/dL (ref 3.5–5.0)
Alkaline Phosphatase: 53 U/L (ref 38–126)
Anion gap: 12 (ref 5–15)
BUN: 11 mg/dL (ref 8–23)
CO2: 21 mmol/L — ABNORMAL LOW (ref 22–32)
Calcium: 9.1 mg/dL (ref 8.9–10.3)
Chloride: 106 mmol/L (ref 98–111)
Creatinine, Ser: 1.35 mg/dL — ABNORMAL HIGH (ref 0.61–1.24)
GFR, Estimated: 57 mL/min — ABNORMAL LOW (ref >60)
Glucose, Bld: 103 mg/dL — ABNORMAL HIGH (ref 70–99)
Potassium: 3.3 mmol/L — ABNORMAL LOW (ref 3.5–5.1)
Sodium: 139 mmol/L (ref 135–145)
Total Bilirubin: 1.1 mg/dL (ref 0.3–1.2)
Total Protein: 7.2 g/dL (ref 6.5–8.1)

## 2021-09-20 LAB — RESP PANEL BY RT-PCR (FLU A&B, COVID) ARPGX2
Influenza A by PCR: NEGATIVE
Influenza B by PCR: NEGATIVE
SARS Coronavirus 2 by RT PCR: NEGATIVE

## 2021-09-20 LAB — PROTIME-INR
INR: 1.1 (ref 0.8–1.2)
Prothrombin Time: 14.2 seconds (ref 11.4–15.2)

## 2021-09-20 LAB — APTT: aPTT: 29 seconds (ref 24–36)

## 2021-09-20 LAB — LACTIC ACID, PLASMA: Lactic Acid, Venous: 2.9 mmol/L (ref 0.5–1.9)

## 2021-09-20 MED ORDER — SODIUM CHLORIDE 0.9 % IV SOLN
2.0000 g | Freq: Once | INTRAVENOUS | Status: AC
  Administered 2021-09-20: 2 g via INTRAVENOUS
  Filled 2021-09-20: qty 20

## 2021-09-20 MED ORDER — SODIUM CHLORIDE (PF) 0.9 % IJ SOLN
INTRAMUSCULAR | Status: AC
  Filled 2021-09-20: qty 50

## 2021-09-20 MED ORDER — SODIUM CHLORIDE 0.9 % IV SOLN: 500.0000 mL | Freq: Once | INTRAVENOUS | Status: DC

## 2021-09-20 MED ORDER — METRONIDAZOLE 500 MG/100ML IV SOLN
500.0000 mg | Freq: Once | INTRAVENOUS | Status: AC
  Administered 2021-09-20: 500 mg via INTRAVENOUS
  Filled 2021-09-20: qty 100

## 2021-09-20 MED ORDER — SODIUM CHLORIDE 0.9 % IV BOLUS
1000.0000 mL | Freq: Once | INTRAVENOUS | Status: AC
  Administered 2021-09-20: 1000 mL via INTRAVENOUS

## 2021-09-20 MED ORDER — MORPHINE SULFATE (PF) 4 MG/ML IV SOLN
4.0000 mg | Freq: Once | INTRAVENOUS | Status: AC
  Administered 2021-09-20: 4 mg via INTRAVENOUS
  Filled 2021-09-20: qty 1

## 2021-09-20 MED ORDER — IOHEXOL 300 MG/ML  SOLN
100.0000 mL | Freq: Once | INTRAMUSCULAR | Status: AC | PRN
  Administered 2021-09-20: 100 mL via INTRAVENOUS

## 2021-09-20 MED ORDER — SODIUM CHLORIDE 0.9 % IV BOLUS
1000.0000 mL | Freq: Once | INTRAVENOUS | Status: AC
  Administered 2021-09-21: 1000 mL via INTRAVENOUS

## 2021-09-20 NOTE — Telephone Encounter (Signed)
Received a call from the patient from the answering service. ?He underwent a colonoscopy for history of Crohn's disease and abnormal imaging.  He is already on Entyvio every 4 weeks.  Looking at a prior CT scan from February there were some findings concerning for potential partial bowel obstruction in the setting of a previous small bowel resection.  The terminal ileum was able to be intubated extensively and biopsies were obtained but no evidence of stricturing disease was found in the area that was noticed.  Patient was discharged doing okay.  However he developed chills.  He was able to take a warm bath that helped.  He is having some progressive abdominal discomfort and cramping.  He does not have access to Gas-X.  He is not having a true fever (his temperature is only 98.3). ? ?Here is the plan of action: ?1) initiate Levsin (he has a prescription) and may use up to 2 doses within the next 2 hours to see if that helps with passage of any retained air ?2) Tylenol 500 mg to 1000 mg every 8 hour as needed ?3) obtain Gas-X if possible and utilize this evening ?4) warm heating pad to his abdomen ?5) continue to try and move about and not stay supine for extensive period in time ?6) if issues progress or fevers develop patient will need to be further evaluated due to the concern of whether there is any retained air or partial SBO in the setting of his known Crohn's disease, at which point he would need to go to the emergency department for further evaluation and imaging ? ?I will forward this information to patient's primary gastroenterologist with the hope that he is feeling better by tomorrow and Dr. Celesta Aver team can follow-up with him tomorrow. ? ?The patient knows to call us back if there are other issues overnight. ? ?Justice Britain, MD ?[facility] ?Advanced Endoscopy ?Office # [phone] ? ?

## 2021-09-20 NOTE — ED Triage Notes (Signed)
Pt had colonoscopy around 1400 today. Since being home, has had abdominal pain, fevers, feels lightheaded. Nausea.  ?

## 2021-09-20 NOTE — ED Provider Triage Note (Addendum)
Emergency Medicine Provider Triage Evaluation Note ? ?[name] , a 71 y.o. male  was evaluated in triage.  Pt complains of chills, severe abdominal cramping. Numerous prior bowel surgery. Colonoscopy today, called [facility] on call and sent to the ER. No complications with scope today. Fever 102 at home. ?Seen at [facility] a few weeks ago for similar symptoms, small intestine was dilated.  ? ?Review of Systems  ?Positive: Abdominal pain, chills, fever ?Negative: vomiting  ? ?Physical Exam  ?BP 102/75 (BP Location: Left Arm)   Pulse (!) 116   Temp 98.9 ?F (37.2 ?C) (Oral)   Resp 16   Ht 5' 10"  (1.778 m)   Wt 94.3 kg   SpO2 98%   BMI 29.84 kg/m?  ?Gen:   Awake, no distress   ?Resp:  Normal effort  ?MSK:   Moves extremities without difficulty  ?Other:   ? ?Medical Decision Making  ?Medically screening exam initiated at 8:21 PM.  Appropriate orders placed.  [name] was informed that the remainder of the evaluation will be completed by another provider, this initial triage assessment does not replace that evaluation, and the importance of remaining in the ED until their evaluation is complete. ? ? ?  ?Tacy Learn, PA-C ?09/20/21 2023 ? ?  ?Tacy Learn, PA-C ?09/20/21 2027 ? ?

## 2021-09-20 NOTE — ED Notes (Signed)
ED Provider at bedside. 

## 2021-09-20 NOTE — Patient Instructions (Addendum)
Please read handouts provided. ?Continue present medications. ?Await pathology results. ? ? ?YOU HAD AN ENDOSCOPIC PROCEDURE TODAY AT [facility]:   Refer to the procedure report that was given to you for any specific questions about what was found during the examination.  If the procedure report does not answer your questions, please call your gastroenterologist to clarify.  If you requested that your care partner not be given the details of your procedure findings, then the procedure report has been included in a sealed envelope for you to review at your convenience later. ? ?YOU SHOULD EXPECT: Some feelings of bloating in the abdomen. Passage of more gas than usual.  Walking can help get rid of the air that was put into your GI tract during the procedure and reduce the bloating. If you had a lower endoscopy (such as a colonoscopy or flexible sigmoidoscopy) you may notice spotting of blood in your stool or on the toilet paper. If you underwent a bowel prep for your procedure, you may not have a normal bowel movement for a few days. ? ?Please Note:  You might notice some irritation and congestion in your nose or some drainage.  This is from the oxygen used during your procedure.  There is no need for concern and it should clear up in a day or so. ? ?SYMPTOMS TO REPORT IMMEDIATELY: ? ?Following lower endoscopy (colonoscopy or flexible sigmoidoscopy): ? Excessive amounts of blood in the stool ? Significant tenderness or worsening of abdominal pains ? Swelling of the abdomen that is new, acute ? Fever of 100?F or higher ? ? ?For urgent or emergent issues, a gastroenterologist can be reached at any hour by calling [phone]. ?Do not use MyChart messaging for urgent concerns.  ? ? ?DIET:  We do recommend a small meal at first, but then you may proceed to your regular diet.  Drink plenty of fluids but you should avoid alcoholic beverages for 24 hours. ? ?ACTIVITY:  You should plan to take it easy  for the rest of today and you should NOT DRIVE or use heavy machinery until tomorrow (because of the sedation medicines used during the test).   ? ?FOLLOW UP: ?Our staff will call the number listed on your records 48-72 hours following your procedure to check on you and address any questions or concerns that you may have regarding the information given to you following your procedure. If we do not reach you, we will leave a message.  We will attempt to reach you two times.  During this call, we will ask if you have developed any symptoms of COVID 19. If you develop any symptoms (ie: fever, flu-like symptoms, shortness of breath, cough etc.) before then, please call [phone].  If you test positive for Covid 19 in the 2 weeks post procedure, please call and report this information to [place].   ? ?If any biopsies were taken you will be contacted by phone or by letter within the next 1-3 weeks.  Please call us at [phone] if you have not heard about the biopsies in 3 weeks.  ? ? ?SIGNATURES/CONFIDENTIALITY: ?You and/or your care partner have signed paperwork which will be entered into your electronic medical record.  These signatures attest to the fact that that the information above on your After Visit Summary has been reviewed and is understood.  Full responsibility of the confidentiality of this discharge information lies with you and/or your care-partner. There were a few very small and  shallow ulcers in the small intestine. I took biopsies. Some diverticulosis but colon otherwise normal. ? ?Let's see how the more frequent Entyvio does. ? ?I will be in touch with the pathology results and set up some f/u. ? ?Glad the nutritionist was helpful. ?I suggest trying to break your orange juice addiction - too much sugar in that which is bad for general health and your gut health. My recommendation to all patients is to avoid getting liquid sugar calories - so if you need protein drinks be sure not much or zero  sugar.  ? ?I appreciate the opportunity to care for you. ?Gatha Mayer, MD, [facility] ? ?

## 2021-09-20 NOTE — ED Provider Notes (Addendum)
[facility] DEPT Provider Note   CSN: [id number] Arrival date & time: 09/20/21  1926     History  Chief Complaint  Patient presents with   Abdominal Pain    OZ GAMMEL is a 71 y.o. male.  The patient presents to the emergency department this evening complaining of abdominal cramping, fever, chills, and nausea.  The patient had a colonoscopy earlier today.  His symptoms began shortly thereafter.  He called [facility] who recommended he come to the emergency department for further evaluation.  The patient denies vomiting, diarrhea, constipation.  The patient has extensive medical and GI history including surgical history of hemicolectomy, ileostomy, laparotomy, bowel resection, colon surgery, ileostomy closure, pacemaker, ventral hernia repair with mesh, and medical history including diverticulitis, asthma, dilated aortic root, history of GI bleed, prostatitis, Crohn's, GERD, history of ileus, history of SVT, history of sepsis.  HPI     Home Medications Prior to Admission medications   Medication Sig Start Date End Date Taking? Authorizing Provider  albuterol (PROVENTIL HFA;VENTOLIN HFA) 108 (90 Base) MCG/ACT inhaler INHALE 2 PUFFS INTO THE LUNGS EVERY 4 (FOUR) HOURS AS NEEDED FOR WHEEZING OR SHORTNESS OF BREATH. 10/24/18   [name], MD  diphenoxylate-atropine (LOMOTIL) 2.5-0.025 MG tablet 1-2 tablets 4 times a day as neded 08/15/21   Gatha Mayer, MD  EPINEPHrine (EPIPEN 2-PAK) 0.3 mg/0.3 mL IJ SOAJ injection Inject 0.3 mLs (0.3 mg total) into the muscle as needed for anaphylaxis. 11/09/18   Sherwood Gambler, MD  esomeprazole (NEXIUM) 20 MG capsule Take 20 mg by mouth daily as needed (acid reflux).    [provider]  fluticasone (CUTIVATE) 0.05 % cream Apply 1 application topically 2 (two) times daily as needed for irritation. 11/02/20   [provider]  halobetasol (ULTRAVATE) 0.05 % ointment Apply topically 2 (two) times daily.  01/08/18   [name], MD  hyoscyamine (LEVSIN SL) 0.125 MG SL tablet PLACE 1 TABLET UNDER THE TONGUE EVERY 4 HOURS AS NEEDED FOR CRAMPING (URGENT DEFECATION) 08/15/21   Gatha Mayer, MD  ketoconazole (NIZORAL) 2 % cream APPLY TWICE A DAY AS NEEDED FOR FUNGAL INFECTION Patient taking differently: Apply 1 application. topically 2 (two) times daily as needed for irritation. APPLY TWICE A DAY AS NEEDED FOR FUNGAL INFECTION 08/16/20   [name], MD  losartan (COZAAR) 25 MG tablet TAKE 1 TABLET DAILY 08/18/21   Josue Hector, MD  morphine (MSIR) 15 MG tablet Take 0.5 tablets (7.5 mg total) by mouth every 4 (four) hours as needed for severe pain. 08/30/21   Deno Etienne, DO  Multiple Vitamins-Minerals (MULTIVITAMIN WITH MINERALS) tablet Take 1 tablet by mouth daily.    [provider]  ondansetron (ZOFRAN-ODT) 4 MG disintegrating tablet 62m ODT q4 hours prn nausea/vomit 08/30/21   FDeno Etienne DO  polyethylene glycol (MIRALAX) 17 g packet Take 17 g by mouth daily. 01/04/21   Armbruster, SCarlota Raspberry MD  pramoxine-hydrocortisone ([facility] 1-1 % rectal cream Place 1 application rectally 2 (two) times daily. Use twice daily before SITZ baths 04/21/19   JMilus Banister MD  tadalafil (CIALIS) 5 MG tablet Take 1 tablet (5 mg total) by mouth daily. 11/23/17   FLaurey Morale MD  traZODone (DESYREL) 50 MG tablet Take 1 tablet (50 mg total) by mouth at bedtime. 04/12/21   FLaurey Morale MD  Turmeric 500 MG CAPS Take 500 mg by mouth daily.    [provider]  vedolizumab (ENTYVIO)  300 MG injection INFUSE 300 MG IV every 8 weeks 08/26/21   Gatha Mayer, MD      Allergies    Mercaptopurine, Shellfish allergy, Humira [adalimumab], and Wound dressing adhesive    Review of Systems   Review of Systems  Constitutional:  Positive for chills and fever.  Respiratory:  Negative for shortness of breath.   Cardiovascular:  Negative for chest pain.  Gastrointestinal:  Positive for abdominal  distention, abdominal pain and nausea. Negative for constipation, diarrhea and vomiting.  Genitourinary:  Negative for dysuria.  Musculoskeletal:  Positive for myalgias.   Physical Exam Updated Vital Signs BP (!) 78/56    Pulse (!) 115    Temp 98.9 F (37.2 C) (Oral)    Resp 17    Ht 5' 10"  (1.778 m)    Wt 94.3 kg    SpO2 98%    BMI 29.84 kg/m  Physical Exam Constitutional:      General: He is not in acute distress.    Appearance: He is normal weight. He is ill-appearing.  HENT:     Head: Normocephalic and atraumatic.  Eyes:     Pupils: Pupils are equal, round, and reactive to light.  Cardiovascular:     Rate and Rhythm: Regular rhythm. Tachycardia present.  Pulmonary:     Effort: Pulmonary effort is normal. No respiratory distress.     Breath sounds: Normal breath sounds.  Abdominal:     General: There is distension.     Palpations: Abdomen is soft.     Tenderness: There is abdominal tenderness in the right upper quadrant and right lower quadrant.  Neurological:     Mental Status: He is alert.    ED Results / Procedures / Treatments   Labs (all labs ordered are listed, but only abnormal results are displayed) Labs Reviewed  LACTIC ACID, PLASMA - Abnormal; Notable for the following components:      Result Value   Lactic Acid, Venous 2.9 (*)    All other components within normal limits  COMPREHENSIVE METABOLIC PANEL - Abnormal; Notable for the following components:   Potassium 3.3 (*)    CO2 21 (*)    Glucose, Bld 103 (*)    Creatinine, Ser 1.35 (*)    GFR, Estimated 57 (*)    All other components within normal limits  CBC WITH DIFFERENTIAL/PLATELET - Abnormal; Notable for the following components:   WBC 15.7 (*)    Neutro Abs 14.6 (*)    Lymphs Abs 0.5 (*)    All other components within normal limits  URINALYSIS, ROUTINE W REFLEX MICROSCOPIC - Abnormal; Notable for the following components:   Color, Urine AMBER (*)    APPearance HAZY (*)    Ketones, ur 5 (*)     Protein, ur 100 (*)    Leukocytes,Ua TRACE (*)    All other components within normal limits  RESP PANEL BY RT-PCR (FLU A&B, COVID) ARPGX2  CULTURE, BLOOD (ROUTINE X 2)  CULTURE, BLOOD (ROUTINE X 2)  URINE CULTURE  PROTIME-INR  APTT  LACTIC ACID, PLASMA    EKG None  Radiology CT Abdomen Pelvis W Contrast  Result Date: 09/20/2021 CLINICAL DATA:  Diffuse abdominal pain and distension, fever after colonoscopy EXAM: CT ABDOMEN AND PELVIS WITH CONTRAST TECHNIQUE: Multidetector CT imaging of the abdomen and pelvis was performed using the standard protocol following bolus administration of intravenous contrast. RADIATION DOSE REDUCTION: This exam was performed according to the departmental dose-optimization program which includes automated exposure control,  adjustment of the mA and/or kV according to patient size and/or use of iterative reconstruction technique. CONTRAST:  163m OMNIPAQUE IOHEXOL 300 MG/ML  SOLN COMPARISON:  CT, 08/30/2021, 01/06/2021, 09/06/2018 FINDINGS: Lower chest: Lung bases demonstrate no acute consolidation or effusion. Stable right lower lobe subpleural nodularity. Partially visualized cardiac pacing leads. Hepatobiliary: No focal liver abnormality is seen. No gallstones, gallbladder wall thickening, or biliary dilatation. Pancreas: Unremarkable. No pancreatic ductal dilatation or surrounding inflammatory changes. Spleen: Normal in size without focal abnormality. Adrenals/Urinary Tract: Adrenal glands are unremarkable. Kidneys are normal, without renal calculi, focal lesion, or hydronephrosis. Bladder is decompressed Stomach/Bowel: Stomach nonenlarged. Status post bowel resection. Chronic fluid distended segment of bowel near the right anterior abdominal anastomosis. No convincing evidence for bowel obstruction. Mild wall thickening and mucosal enhancement of right lower quadrant distal small bowel, series 2, image 53. Mild fluid and surrounding inflammatory process. Negative  appendix. Vascular/Lymphatic: Nonaneurysmal aorta. Mild atherosclerosis. No suspicious lymph nodes Reproductive: Slightly enlarged prostate Other: Negative for pelvic effusion or free air. Small fat containing inguinal hernias. Musculoskeletal: No acute osseous abnormality. IMPRESSION: 1. Evidence of prior bowel resections with chronic fluid distended segment of small bowel in the right anterior abdomen in the region of anastomosis. No convincing evidence for a bowel obstruction at this time. 2. Focal wall thickening with mucosal enhancement involving distal small bowel in the right lower quadrant with small amount of adjacent fluid and soft tissue stranding/inflammation, suspicious for active enteritis. Electronically Signed   By: KDonavan FoilM.D.   On: 09/20/2021 22:31   DG Abd Acute W/Chest  Result Date: 09/20/2021 CLINICAL DATA:  Status post colonoscopy today with abdominal pain, fever and chills. EXAM: DG ABDOMEN ACUTE WITH 1 VIEW CHEST COMPARISON:  January 04, 2021 FINDINGS: There is no evidence of dilated bowel loops or free intraperitoneal air. No radiopaque calculi are seen. Ill-defined surgical sutures are seen overlying the mid right abdomen. Radiopaque surgical clips are seen within pelvis on the left. Heart size and mediastinal contours are within normal limits. There is a dual lead AICD. Both lungs are clear. IMPRESSION: Negative abdominal radiographs. No acute cardiopulmonary disease. Electronically Signed   By: TVirgina NorfolkM.D.   On: 09/20/2021 20:51    Procedures .Critical Care Performed by: MDorothyann Peng PA-C Authorized by: MDorothyann Peng PA-C   Critical care provider statement:    Critical care time (minutes):  45   Critical care time was exclusive of:  Separately billable procedures and treating other patients   Critical care was necessary to treat or prevent imminent or life-threatening deterioration of the following conditions:  Sepsis   Critical care was time spent  personally by me on the following activities:  Development of treatment plan with patient or surrogate, discussions with consultants, evaluation of patient's response to treatment, examination of patient, ordering and review of laboratory studies, ordering and review of radiographic studies, ordering and performing treatments and interventions, pulse oximetry, re-evaluation of patient's condition and review of old charts   I assumed direction of critical care for this patient from another provider in my specialty: no     Care discussed with: admitting provider      Medications Ordered in ED Medications  sodium chloride (PF) 0.9 % injection (has no administration in time range)  metroNIDAZOLE (FLAGYL) IVPB 500 mg (500 mg Intravenous New Bag/Given 09/20/21 2304)  iohexol (OMNIPAQUE) 300 MG/ML solution 100 mL (100 mLs Intravenous Contrast Given 09/20/21 2157)  sodium chloride 0.9 % bolus 1,000 mL (  1,000 mLs Intravenous New Bag/Given 09/20/21 2217)  morphine (PF) 4 MG/ML injection 4 mg (4 mg Intravenous Given 09/20/21 2215)  cefTRIAXone (ROCEPHIN) 2 g in sodium chloride 0.9 % 100 mL IVPB (0 g Intravenous Stopped 09/20/21 2301)    ED Course/ Medical Decision Making/ A&P Clinical Course as of 09/20/21 2338  Tue Sep 20, 2021  2331 Pulse Rate(!): 115 [LM]  2331 BP(!): 78/56 [LM]    Clinical Course User Index [LM] Ronny Bacon                           Medical Decision Making Amount and/or Complexity of Data Reviewed Radiology: ordered.  Risk OTC drugs. Prescription drug management. Decision regarding hospitalization.   This patient presents to the ED for concern of abdominal cramping, fever, and nausea after colonoscopy, this involves an extensive number of treatment options, and is a complaint that carries with it a high risk of complications and morbidity.  The differential diagnosis includes but is not limited to bowel perforation, small bowel obstruction, sepsis, gastroparesis,  mesenteric ischemia, peritonitis, and others   Co morbidities that complicate the patient evaluation  Crohn's disease, hx of bowel resection   Additional history obtained:   External records from outside source obtained and reviewed including progress notes from GI, ED notes from 2/14   Lab Tests:  I Ordered, and personally interpreted labs.  The pertinent results include:  Lactic acid 2.9, potassium 3.3, WBC 15.7   Imaging Studies ordered:  I ordered imaging studies including DG abd acute w/ chest, CT abdomen pelvis w/ contrast  I independently visualized and interpreted imaging which showed no free air or dilated bowel loops on x-ray, no obvious bowel obstruction on CT I agree with the radiologist interpretation   Cardiac Monitoring:  The patient was maintained on a cardiac monitor.  I personally viewed and interpreted the cardiac monitored which showed an underlying rhythm of: Sinus tachycardia   Medicines ordered and prescription drug management:  I ordered medication including morphine for pain  Reevaluation of the patient after these medicines showed that the patient improved I have reviewed the patients home medicines and have made adjustments as needed   Test Considered:  MRI abdomen   The patient presents with fever, abdominal cramping, and nausea after colonoscopy with a very complex GI history. Lactic acid is 2.9. Plan to check repeat lactic acid. Patient's vital signs became less stable with BP 78/56, HR 115. Consulting with the hospitalist for possible admission. Dr. Marlowe Sax asked for more fluid resuscitation to stabilize pressures prior to admission. If BP does not stabilize plan on critical care consult for possible management. 1L NS reordered. Patient care transferred to oncoming provider at shift change.   Final Clinical Impression(s) / ED Diagnoses Final diagnoses:  Nausea  Generalized abdominal pain    Rx / DC Orders ED Discharge Orders      None         Ronny Bacon 09/21/21 0010    Dorothyann Peng, PA-C 09/23/21 2831    Varney Biles, MD 09/26/21 [phone]

## 2021-09-20 NOTE — Progress Notes (Signed)
Vital signs checked by:DT ? ? ?The medical and surgical history was reviewed and verified with the patient. ? ?

## 2021-09-20 NOTE — Op Note (Signed)
[facility] ?Patient Name: [name] ?Procedure Date: 09/20/2021 2:15 PM ?MRN: [id number] ?Endoscopist: Gatha Mayer , MD ?Age: 71 ?Referring MD:  ?Date of Birth: 1951/04/13 ?Gender: Male ?Account #: [id number] ?Procedure:                Colonoscopy ?Indications:              Crohn's disease of the small bowel, Follow-up of  ?                          Crohn's disease of the small bowel, Disease  ?                          activity assessment of Crohn's disease of the small  ?                          bowel, Assess therapeutic response to therapy of  ?                          Crohn's disease of the small bowel Flaring on q 8  ?                          wk Entyvio with low trough levels but no Abs, fecal  ?                          calprotectin elevated at 697, recent brief steroid  ?                          taper has helped ?Medicines:                Monitored Anesthesia Care ?Procedure:                Pre-Anesthesia Assessment: ?                          - Prior to the procedure, a History and Physical  ?                          was performed, and patient medications and  ?                          allergies were reviewed. The patient's tolerance of  ?                          previous anesthesia was also reviewed. The risks  ?                          and benefits of the procedure and the sedation  ?                          options and risks were discussed with the patient.  ?                          All questions were answered, and informed consent  ?  was obtained. Prior Anticoagulants: The patient has  ?                          taken no previous anticoagulant or antiplatelet  ?                          agents. ASA Grade Assessment: III - A patient with  ?                          severe systemic disease. After reviewing the risks  ?                          and benefits, the patient was deemed in  ?                          satisfactory condition to undergo the  procedure. ?                          After obtaining informed consent, the colonoscope  ?                          was passed under direct vision. Throughout the  ?                          procedure, the patient's blood pressure, pulse, and  ?                          oxygen saturations were monitored continuously. The  ?                          Olympus CF-HQ190L ([id number]) Colonoscope was  ?                          introduced through the anus and advanced to the 20  ?                          cm into the ileum. The colonoscopy was performed  ?                          without difficulty. The patient tolerated the  ?                          procedure well. The quality of the bowel  ?                          preparation was good. The terminal ileum, ileocecal  ?                          valve, appendiceal orifice, and rectum were  ?                          photographed. ?Scope In: 2:26:08 PM ?Scope Out: 2:37:03 PM ?Scope Withdrawal Time: 0 hours 9 minutes 25 seconds  ?Total Procedure Duration: 0 hours 10  minutes 55 seconds  ?Findings:                 The perianal and digital rectal examinations were  ?                          normal. ?                          Inflammation characterized by shallow ulcerations  ?                          was found in the distal ileum. Biopsies were taken  ?                          with a cold forceps for histology. Verification of  ?                          patient identification for the specimen was done.  ?                          Estimated blood loss was minimal. ?                          Scattered diverticula were found in the right colon. ?                          The exam was otherwise without abnormality on  ?                          direct and retroflexion views. ?Complications:            No immediate complications. ?Estimated Blood Loss:     Estimated blood loss was minimal. ?Impression:               - Crohn's disease with ileitis. Biopsied. Only a  ?                           few very small and subtle superficial ulcers seen.  ?                          May not have captured with biopsies though I tried. ?                          - Diverticulosis in the right colon. ?                          - The examination was otherwise normal on direct  ?                          and retroflexion views. This is improved vs 2018  ?                          exam ?Recommendation:           - Patient has a contact number available for  ?  emergencies. The signs and symptoms of potential  ?                          delayed complications were discussed with the  ?                          patient. Return to normal activities tomorrow.  ?                          Written discharge instructions were provided to the  ?                          patient. ?                          - Resume previous diet. ?                          - Continue present medications. ?                          - Await pathology results. ?                          - No recommendation at this time regarding repeat  ?                          colonoscopy. ?                          - Hopefully q 4 week Entyio will help. ?Gatha Mayer, MD ?09/20/2021 2:49:39 PM ?This report has been signed electronically. ?

## 2021-09-20 NOTE — Progress Notes (Signed)
[facility] History and Physical   Primary Care Physician:  [name], MD   Reason for Procedure:   Evaluate Crohn's  Plan:    colonoscopy     HPI: [name] is a 71 y.o. male with flaring Crohn's ileitis despite Entyvio therapy. Recent possible pSBO,  ? Adhesions vs. Crohn's   Past Medical History:  Diagnosis Date   Acute prostatitis 07/24/2007   Qualifier: Diagnosis of  By: Clent Ridges MD, Tera Mater    Allergy    mild   Arthritis    osteoarthritis   Asthma    Blood transfusion without reported diagnosis    BPH (benign prostatic hypertrophy) with urinary obstruction    Crohn's ileitis (HCC) suspected 05/03/2017   Dilated aortic root (HCC)    noted on echo 08/2012   Diverticulitis of colon    EPIDIDYMITIS 02/15/2010   Qualifier: Diagnosis of  By: Clent Ridges MD, Tera Mater    GERD (gastroesophageal reflux disease)    H/O: GI bleed    Hemorrhoids    Hepatitis 1975   unknown type    HERPES SIMPLEX INFECTION 10/14/2007   Qualifier: Diagnosis of  By: Clent Ridges MD, Jeannett Senior A    Hiatal hernia    Ileus following gastrointestinal surgery (HCC) 12/26/2011   Long term (current) use of systemic steroids 06/18/2018   Psoriasis    sees Dr. Karene Fry at [facility].   Recurrent ventral incisional hernia 05/10/2012   SVT (supraventricular tachycardia) (HCC)    Ulcer 08/21/2013   ileal    Past Surgical History:  Procedure Laterality Date   BOWEL RESECTION  12/19/2011   Procedure: SMALL BOWEL RESECTION;  Surgeon: Mariella Saa, MD;  Location: [facility];  Service: General;  Laterality: N/A;  with anastamosis and insertion mesh   BRONCHOSCOPY     COLON SURGERY  01/2004   x 2   COLONOSCOPY W/ BIOPSIES  04/26/2017   per Dr. Leone Payor, no polyps, benign inflammation, repeat in 5 yrs    CYSTOSCOPY     ESOPHAGOGASTRODUODENOSCOPY     HEMICOLECTOMY     left side, at [facility], diverticulitis   HEMORRHOID BANDING     HERNIA REPAIR     [id number] incisional hernia   ILEOSTOMY      ILEOSTOMY CLOSURE     INSERTION OF MESH  07/31/2012   Procedure: INSERTION OF MESH;  Surgeon: Mariella Saa, MD;  Location: [facility];  Service: General;;   LAPAROTOMY  12/19/2011   Procedure: EXPLORATORY LAPAROTOMY;  Surgeon: Mariella Saa, MD;  Location: [facility];  Service: General;  Laterality: N/A;   PACEMAKER IMPLANT N/A 12/01/2020   Procedure: PACEMAKER IMPLANT;  Surgeon: Regan Lemming, MD;  Location: [facility];  Service: Cardiovascular;  Laterality: N/A;   PACEMAKER INSERTION Left    TONSILLECTOMY     UPPER GASTROINTESTINAL ENDOSCOPY     VASECTOMY     VENTRAL HERNIA REPAIR  07/31/2012   Procedure: HERNIA REPAIR VENTRAL ADULT;  Surgeon: Mariella Saa, MD;  Location: [facility];  Service: General;  Laterality: N/A;    Prior to Admission medications   Medication Sig Start Date End Date Taking? Authorizing Provider  fluticasone (CUTIVATE) 0.05 % cream Apply 1 application topically 2 (two) times daily as needed for irritation. 11/02/20  Yes [provider]  losartan (COZAAR) 25 MG tablet TAKE 1 TABLET DAILY 08/18/21  Yes Wendall Stade, MD  Multiple Vitamins-Minerals (MULTIVITAMIN WITH MINERALS) tablet Take 1 tablet by mouth daily.  Yes [provider]  polyethylene glycol (MIRALAX) 17 g packet Take 17 g by mouth daily. 01/04/21  Yes Armbruster, Willaim Rayas, MD  traZODone (DESYREL) 50 MG tablet Take 1 tablet (50 mg total) by mouth at bedtime. 04/12/21  Yes [name], MD  Turmeric 500 MG CAPS Take 500 mg by mouth daily.   Yes [provider]  albuterol (PROVENTIL HFA;VENTOLIN HFA) 108 (90 Base) MCG/ACT inhaler INHALE 2 PUFFS INTO THE LUNGS EVERY 4 (FOUR) HOURS AS NEEDED FOR WHEEZING OR SHORTNESS OF BREATH. 10/24/18   [name], MD  diphenoxylate-atropine (LOMOTIL) 2.5-0.025 MG tablet 1-2 tablets 4 times a day as neded 08/15/21   Iva Boop, MD  EPINEPHrine (EPIPEN 2-PAK) 0.3 mg/0.3 mL IJ SOAJ injection Inject 0.3 mLs (0.3 mg total)  into the muscle as needed for anaphylaxis. 11/09/18   Pricilla Loveless, MD  esomeprazole (NEXIUM) 20 MG capsule Take 20 mg by mouth daily as needed (acid reflux).    [provider]  halobetasol (ULTRAVATE) 0.05 % ointment Apply topically 2 (two) times daily. 01/08/18   [name], MD  hyoscyamine (LEVSIN SL) 0.125 MG SL tablet PLACE 1 TABLET UNDER THE TONGUE EVERY 4 HOURS AS NEEDED FOR CRAMPING (URGENT DEFECATION) 08/15/21   Iva Boop, MD  ketoconazole (NIZORAL) 2 % cream APPLY TWICE A DAY AS NEEDED FOR FUNGAL INFECTION Patient taking differently: Apply 1 application. topically 2 (two) times daily as needed for irritation. APPLY TWICE A DAY AS NEEDED FOR FUNGAL INFECTION 08/16/20   [name], MD  morphine (MSIR) 15 MG tablet Take 0.5 tablets (7.5 mg total) by mouth every 4 (four) hours as needed for severe pain. 08/30/21   Melene Plan, DO  ondansetron (ZOFRAN-ODT) 4 MG disintegrating tablet 4mg  ODT q4 hours prn nausea/vomit 08/30/21   Melene Plan, DO  pramoxine-hydrocortisone [facility]) 1-1 % rectal cream Place 1 application rectally 2 (two) times daily. Use twice daily before SITZ baths 04/21/19   Rachael Fee, MD  tadalafil (CIALIS) 5 MG tablet Take 1 tablet (5 mg total) by mouth daily. 11/23/17   [name], MD  vedolizumab (ENTYVIO) 300 MG injection INFUSE 300 MG IV every 8 weeks 08/26/21   Iva Boop, MD    Current Outpatient Medications  Medication Sig Dispense Refill   fluticasone (CUTIVATE) 0.05 % cream Apply 1 application topically 2 (two) times daily as needed for irritation.     losartan (COZAAR) 25 MG tablet TAKE 1 TABLET DAILY 90 tablet 0   Multiple Vitamins-Minerals (MULTIVITAMIN WITH MINERALS) tablet Take 1 tablet by mouth daily.     polyethylene glycol (MIRALAX) 17 g packet Take 17 g by mouth daily. 14 each 0   traZODone (DESYREL) 50 MG tablet Take 1 tablet (50 mg total) by mouth at bedtime. 90 tablet 3   Turmeric 500 MG CAPS Take 500 mg by mouth  daily.     albuterol (PROVENTIL HFA;VENTOLIN HFA) 108 (90 Base) MCG/ACT inhaler INHALE 2 PUFFS INTO THE LUNGS EVERY 4 (FOUR) HOURS AS NEEDED FOR WHEEZING OR SHORTNESS OF BREATH. 8.5 Inhaler 2   diphenoxylate-atropine (LOMOTIL) 2.5-0.025 MG tablet 1-2 tablets 4 times a day as neded 90 tablet 1   EPINEPHrine (EPIPEN 2-PAK) 0.3 mg/0.3 mL IJ SOAJ injection Inject 0.3 mLs (0.3 mg total) into the muscle as needed for anaphylaxis. 1 Device 0   esomeprazole (NEXIUM) 20 MG capsule Take 20 mg by mouth daily as needed (acid reflux).     halobetasol (ULTRAVATE) 0.05 %  ointment Apply topically 2 (two) times daily. 50 g 5   hyoscyamine (LEVSIN SL) 0.125 MG SL tablet PLACE 1 TABLET UNDER THE TONGUE EVERY 4 HOURS AS NEEDED FOR CRAMPING (URGENT DEFECATION) 60 tablet 2   ketoconazole (NIZORAL) 2 % cream APPLY TWICE A DAY AS NEEDED FOR FUNGAL INFECTION (Patient taking differently: Apply 1 application. topically 2 (two) times daily as needed for irritation. APPLY TWICE A DAY AS NEEDED FOR FUNGAL INFECTION) 30 g 5   morphine (MSIR) 15 MG tablet Take 0.5 tablets (7.5 mg total) by mouth every 4 (four) hours as needed for severe pain. 5 tablet 0   ondansetron (ZOFRAN-ODT) 4 MG disintegrating tablet 4mg  ODT q4 hours prn nausea/vomit 20 tablet 0   pramoxine-hydrocortisone (ANALPRAM-HC) 1-1 % rectal cream Place 1 application rectally 2 (two) times daily. Use twice daily before SITZ baths 30 g 0   tadalafil (CIALIS) 5 MG tablet Take 1 tablet (5 mg total) by mouth daily. 90 tablet 3   vedolizumab (ENTYVIO) 300 MG injection INFUSE 300 MG IV every 8 weeks 1 each 6   Current Facility-Administered Medications  Medication Dose Route Frequency Provider Last Rate Last Admin   0.9 %  sodium chloride infusion  500 mL Intravenous Once Iva Boop, MD        Allergies as of 09/20/2021 - Review Complete 09/20/2021  Allergen Reaction Noted   Mercaptopurine Other (See Comments) 10/08/2017   Shellfish allergy Anaphylaxis 12/12/2011    Humira [adalimumab]  03/20/2019   Wound dressing adhesive Rash 12/30/2020    Family History  Problem Relation Age of Onset   Lung cancer Mother    Leukemia Father    Hypertension Father    Heart disease Father    Heart attack Father    Prostate cancer Father    Prostate cancer Paternal Uncle    Colon cancer Neg Hx    Stomach cancer Neg Hx    Colon polyps Neg Hx    Esophageal cancer Neg Hx    Rectal cancer Neg Hx     Social History   Socioeconomic History   Marital status: Married    Spouse name: Not on file   Number of children: 2   Years of education: Not on file   Highest education level: Not on file  Occupational History   Occupation: EHS Production designer, theatre/television/film    Employer: [employer]  Tobacco Use   Smoking status: Never   Smokeless tobacco: Never  Vaping Use   Vaping Use: Never used  Substance and Sexual Activity   Alcohol use: Yes    Alcohol/week: 0.0 standard drinks    Comment: occ   Drug use: No   Sexual activity: Not on file  Other Topics Concern   Not on file  Social History Narrative   He is married with 2 sons 1 son is an Acupuncturist and the 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  Physical Exam: Vital signs BP 94/68   Pulse 70   Temp (!) 97.3 F (36.3 C) (Temporal)   Ht 5\' 10"  (1.778 m)   Wt 208 lb (94.3 kg)   SpO2 97%  BMI 29.84 kg/m   General:   Alert,  Well-developed, well-nourished, pleasant and cooperative in NAD Lungs:  Clear throughout to auscultation.   Heart:  Regular rate and rhythm; no murmurs, clicks, rubs,  or gallops. Abdomen:  Soft, nontender and nondistended. Normal bowel sounds.   Neuro/Psych:  Alert and cooperative.  Normal mood and affect. A and O x 3   @[facility]  Sena Slate, MD, [facility] [phone] (pager) 09/20/2021 2:15 PM@

## 2021-09-20 NOTE — Progress Notes (Signed)
PT taken to PACU. Monitors in place. VSS. Report given to RN. 

## 2021-09-20 NOTE — Telephone Encounter (Signed)
Patient called saying he was cold after the procedure. He went home and took his temp after shivering violently. Said his temperature was 91.0. He got in a hot bath and was in there when I talked to him. I had him recheck his temperature and it was 98.8. I advised him to dress warmly when he got out and to get under a blanket or his covers. I also advised him to eat something and to increase his fluids. He replied that he was drinking his warm bath water. I also told him to let us know if it got low again and he agreed. Made MD aware.  ?

## 2021-09-20 NOTE — ED Notes (Signed)
Patient transported to CT 

## 2021-09-20 NOTE — ED Notes (Signed)
Pt mild distress in bed, a/ox4. Pt states he has had multiple bowel resections and today had a colonoscopy. After returning home, he began having diffuse ABD pain and distension and fever of 102. Pt denies n/v/d or illness prior to colonoscopy ? ?

## 2021-09-20 NOTE — Progress Notes (Signed)
Called to room to assist during endoscopic procedure.  Patient ID and intended procedure confirmed with present staff. Received instructions for my participation in the procedure from the performing physician.  

## 2021-09-21 ENCOUNTER — Encounter (HOSPITAL_COMMUNITY): Payer: Self-pay | Admitting: Internal Medicine

## 2021-09-21 DIAGNOSIS — E876 Hypokalemia: Secondary | ICD-10-CM | POA: Diagnosis present

## 2021-09-21 DIAGNOSIS — Z6829 Body mass index (BMI) 29.0-29.9, adult: Secondary | ICD-10-CM | POA: Diagnosis not present

## 2021-09-21 DIAGNOSIS — E669 Obesity, unspecified: Secondary | ICD-10-CM | POA: Diagnosis present

## 2021-09-21 DIAGNOSIS — Z8249 Family history of ischemic heart disease and other diseases of the circulatory system: Secondary | ICD-10-CM | POA: Diagnosis not present

## 2021-09-21 DIAGNOSIS — Z806 Family history of leukemia: Secondary | ICD-10-CM | POA: Diagnosis not present

## 2021-09-21 DIAGNOSIS — K5730 Diverticulosis of large intestine without perforation or abscess without bleeding: Secondary | ICD-10-CM | POA: Diagnosis present

## 2021-09-21 DIAGNOSIS — R110 Nausea: Secondary | ICD-10-CM | POA: Diagnosis present

## 2021-09-21 DIAGNOSIS — M1990 Unspecified osteoarthritis, unspecified site: Secondary | ICD-10-CM | POA: Diagnosis present

## 2021-09-21 DIAGNOSIS — Z950 Presence of cardiac pacemaker: Secondary | ICD-10-CM | POA: Diagnosis not present

## 2021-09-21 DIAGNOSIS — D696 Thrombocytopenia, unspecified: Secondary | ICD-10-CM | POA: Diagnosis present

## 2021-09-21 DIAGNOSIS — A419 Sepsis, unspecified organism: Secondary | ICD-10-CM | POA: Diagnosis present

## 2021-09-21 DIAGNOSIS — K50019 Crohn's disease of small intestine with unspecified complications: Secondary | ICD-10-CM

## 2021-09-21 DIAGNOSIS — Z8042 Family history of malignant neoplasm of prostate: Secondary | ICD-10-CM | POA: Diagnosis not present

## 2021-09-21 DIAGNOSIS — Z888 Allergy status to other drugs, medicaments and biological substances status: Secondary | ICD-10-CM | POA: Diagnosis not present

## 2021-09-21 DIAGNOSIS — Z91013 Allergy to seafood: Secondary | ICD-10-CM | POA: Diagnosis not present

## 2021-09-21 DIAGNOSIS — K5080 Crohn's disease of both small and large intestine without complications: Secondary | ICD-10-CM | POA: Diagnosis present

## 2021-09-21 DIAGNOSIS — K5900 Constipation, unspecified: Secondary | ICD-10-CM | POA: Diagnosis present

## 2021-09-21 DIAGNOSIS — Z79899 Other long term (current) drug therapy: Secondary | ICD-10-CM | POA: Diagnosis not present

## 2021-09-21 DIAGNOSIS — N400 Enlarged prostate without lower urinary tract symptoms: Secondary | ICD-10-CM | POA: Diagnosis present

## 2021-09-21 DIAGNOSIS — Z9049 Acquired absence of other specified parts of digestive tract: Secondary | ICD-10-CM | POA: Diagnosis not present

## 2021-09-21 DIAGNOSIS — K219 Gastro-esophageal reflux disease without esophagitis: Secondary | ICD-10-CM | POA: Diagnosis present

## 2021-09-21 DIAGNOSIS — Z20822 Contact with and (suspected) exposure to covid-19: Secondary | ICD-10-CM | POA: Diagnosis present

## 2021-09-21 DIAGNOSIS — Z801 Family history of malignant neoplasm of trachea, bronchus and lung: Secondary | ICD-10-CM | POA: Diagnosis not present

## 2021-09-21 DIAGNOSIS — R1084 Generalized abdominal pain: Secondary | ICD-10-CM | POA: Diagnosis not present

## 2021-09-21 DIAGNOSIS — J45909 Unspecified asthma, uncomplicated: Secondary | ICD-10-CM | POA: Diagnosis present

## 2021-09-21 LAB — BASIC METABOLIC PANEL
Anion gap: 8 (ref 5–15)
BUN: 10 mg/dL (ref 8–23)
CO2: 23 mmol/L (ref 22–32)
Calcium: 8 mg/dL — ABNORMAL LOW (ref 8.9–10.3)
Chloride: 107 mmol/L (ref 98–111)
Creatinine, Ser: 0.92 mg/dL (ref 0.61–1.24)
GFR, Estimated: >60 mL/min (ref >60)
Glucose, Bld: 85 mg/dL (ref 70–99)
Potassium: 3.7 mmol/L (ref 3.5–5.1)
Sodium: 138 mmol/L (ref 135–145)

## 2021-09-21 LAB — CBC
HCT: 44.5 % (ref 39.0–52.0)
Hemoglobin: 14.4 g/dL (ref 13.0–17.0)
MCH: 29.5 pg (ref 26.0–34.0)
MCHC: 32.4 g/dL (ref 30.0–36.0)
MCV: 91.2 fL (ref 80.0–100.0)
Platelets: 132 10*3/uL — ABNORMAL LOW (ref 150–400)
RBC: 4.88 MIL/uL (ref 4.22–5.81)
RDW: 13.5 % (ref 11.5–15.5)
WBC: 13.1 10*3/uL — ABNORMAL HIGH (ref 4.0–10.5)
nRBC: 0 % (ref 0.0–0.2)

## 2021-09-21 LAB — LACTIC ACID, PLASMA: Lactic Acid, Venous: 1.5 mmol/L (ref 0.5–1.9)

## 2021-09-21 LAB — PHOSPHORUS: Phosphorus: 2.3 mg/dL — ABNORMAL LOW (ref 2.5–4.6)

## 2021-09-21 LAB — MAGNESIUM: Magnesium: 1.9 mg/dL (ref 1.7–2.4)

## 2021-09-21 MED ORDER — METRONIDAZOLE 500 MG/100ML IV SOLN
500.0000 mg | Freq: Two times a day (BID) | INTRAVENOUS | Status: DC
  Administered 2021-09-21 – 2021-09-23 (×5): 500 mg via INTRAVENOUS
  Filled 2021-09-21 (×5): qty 100

## 2021-09-21 MED ORDER — TRAZODONE HCL 50 MG PO TABS
50.0000 mg | ORAL_TABLET | Freq: Every day | ORAL | Status: DC
  Administered 2021-09-21 – 2021-09-23 (×3): 50 mg via ORAL
  Filled 2021-09-21 (×3): qty 1

## 2021-09-21 MED ORDER — HYDROMORPHONE HCL 1 MG/ML IJ SOLN
1.0000 mg | Freq: Once | INTRAMUSCULAR | Status: AC
  Administered 2021-09-21: 1 mg via INTRAVENOUS
  Filled 2021-09-21: qty 1

## 2021-09-21 MED ORDER — ONDANSETRON HCL 4 MG PO TABS: 4.0000 mg | ORAL_TABLET | Freq: Four times a day (QID) | ORAL | Status: DC | PRN

## 2021-09-21 MED ORDER — ONDANSETRON HCL 4 MG/2ML IJ SOLN
4.0000 mg | Freq: Once | INTRAMUSCULAR | Status: AC
  Administered 2021-09-21: 4 mg via INTRAVENOUS
  Filled 2021-09-21: qty 2

## 2021-09-21 MED ORDER — ACETAMINOPHEN 650 MG RE SUPP: 650.0000 mg | Freq: Four times a day (QID) | RECTAL | Status: DC | PRN

## 2021-09-21 MED ORDER — ENOXAPARIN SODIUM 40 MG/0.4ML IJ SOSY: 40.0000 mg | PREFILLED_SYRINGE | INTRAMUSCULAR | Status: DC

## 2021-09-21 MED ORDER — POTASSIUM CHLORIDE IN NACL 40-0.9 MEQ/L-% IV SOLN
INTRAVENOUS | Status: AC
  Filled 2021-09-21: qty 1000

## 2021-09-21 MED ORDER — ACETAMINOPHEN 325 MG PO TABS
650.0000 mg | ORAL_TABLET | Freq: Once | ORAL | Status: AC
  Administered 2021-09-21: 650 mg via ORAL
  Filled 2021-09-21: qty 2

## 2021-09-21 MED ORDER — PANTOPRAZOLE SODIUM 40 MG IV SOLR
40.0000 mg | INTRAVENOUS | Status: DC
Start: 2021-09-21 — End: 2021-09-22
  Administered 2021-09-21 – 2021-09-22 (×2): 40 mg via INTRAVENOUS
  Filled 2021-09-21 (×2): qty 10

## 2021-09-21 MED ORDER — MORPHINE SULFATE (PF) 2 MG/ML IV SOLN
1.0000 mg | Freq: Once | INTRAVENOUS | Status: AC | PRN
  Administered 2021-09-21: 1 mg via INTRAVENOUS
  Filled 2021-09-21: qty 1

## 2021-09-21 MED ORDER — ONDANSETRON HCL 4 MG/2ML IJ SOLN
4.0000 mg | Freq: Four times a day (QID) | INTRAMUSCULAR | Status: DC | PRN
Start: 2021-09-21 — End: 2021-09-24

## 2021-09-21 MED ORDER — ACETAMINOPHEN 325 MG PO TABS
650.0000 mg | ORAL_TABLET | Freq: Four times a day (QID) | ORAL | Status: DC | PRN
  Administered 2021-09-24: 650 mg via ORAL
  Filled 2021-09-21: qty 2

## 2021-09-21 MED ORDER — METHYLPREDNISOLONE SODIUM SUCC 125 MG IJ SOLR
60.0000 mg | INTRAMUSCULAR | Status: DC
  Administered 2021-09-21 – 2021-09-22 (×2): 60 mg via INTRAVENOUS
  Filled 2021-09-21 (×2): qty 2

## 2021-09-21 MED ORDER — SODIUM CHLORIDE 0.9 % IV SOLN
2.0000 g | INTRAVENOUS | Status: DC
  Administered 2021-09-22 (×2): 2 g via INTRAVENOUS
  Filled 2021-09-21 (×2): qty 20

## 2021-09-21 MED ORDER — HYDROMORPHONE HCL 1 MG/ML IJ SOLN
1.0000 mg | INTRAMUSCULAR | Status: DC | PRN
  Administered 2021-09-21 – 2021-09-22 (×4): 1 mg via INTRAVENOUS
  Filled 2021-09-21 (×4): qty 1

## 2021-09-21 MED ORDER — MAGNESIUM SULFATE 2 GM/50ML IV SOLN
2.0000 g | Freq: Once | INTRAVENOUS | Status: AC
  Administered 2021-09-21: 2 g via INTRAVENOUS
  Filled 2021-09-21: qty 50

## 2021-09-21 NOTE — ED Notes (Addendum)
j

## 2021-09-21 NOTE — ED Notes (Signed)
Verified via Micromedex IV compatibility of Potassium and Magnesium.  ?

## 2021-09-21 NOTE — ED Notes (Signed)
Pharmacy paged to verify pt's NaCl Kcl 69mq infusion. ?

## 2021-09-21 NOTE — ED Notes (Signed)
Verified via Micromedex the IV compatibility of Potassium Chloride and Metronidazole.  ?

## 2021-09-21 NOTE — Telephone Encounter (Signed)
Pt stated that is is currently admitted to the [facility]) and they are treating him for an infection: Pt was notified that I will reach out to [name] and make him aware: ?Pt verbalized understanding with all questions answered.  ? ?

## 2021-09-21 NOTE — ED Provider Notes (Signed)
00:15: Patient care signed out to me pending reevaluation of blood pressures with additional 1 L fluid.  Case was discussed with hospital Dr. Marlowe Sax who requested callback if BP improves, and if not to admit to critical care. ? ?BP improved.  ?Vitals:  ? 09/21/21 0230 09/21/21 0300 09/21/21 0330 09/21/21 0345  ?BP: 110/60 112/63 113/65 122/63  ?Pulse: 69 66 65 65  ?Resp: 15 13 13 17   ?Temp:      ?TempSrc:      ?SpO2: 96% 95% 97% 96%  ?Weight:      ?Height:      ?  ?01:54: Consult placed to hospitalist service.  ?02:35: Re-paged.  ? ?Ultimately discussed with hospitalist Dr. Marlowe Sax- accepts admission.  ? ? ?  ?Amaryllis Dyke, PA-C ?09/21/21 0406 ? ?  ?Veryl Speak, MD ?09/21/21 [phone] ? ?

## 2021-09-21 NOTE — H&P (Signed)
History and Physical    Patient: [name] YHC:[id number] DOB: 1951/06/19 DOA: 09/20/2021 DOS: the patient was seen and examined on 09/21/2021 PCP: [name], MD  Patient coming from: Home  Chief Complaint:  Chief Complaint  Patient presents with   Abdominal Pain   HPI: [name] is a 71 y.o. male with medical history significant of acute prostatitis, epididymitis, mild allergies, osteoarthritis, asthma, BPH, Crohn's ileitis, history of prolonged steroid use, dilated aortic root, diverticulosis/diverticulitis of colon, GERD, hiatal hernia, recurring ventricular hernia, history of GI bleed, hemorrhoids, history of unknown hepatitis, herpes simply infection, psoriasis, SVT who had a colonoscopy yesterday, but stated he developed abdominal cramping, lightheadedness, nausea, chills and fever after he was home.  He tried sublingual hyoscyamine without any significant results.  He was subsequently told by GI on-call to report to the emergency department.  No emesis, diarrhea, melena or hematochezia.  He has been constipated since the bowel prep.  He denied headache, rhinorrhea, sore throat, dyspnea, wheezing or hemoptysis.  No chest pain, palpitations, PND, orthopnea or pitting edema of the lower extremities.  Positive right flank pain, but no dysuria, frequency or hematuria.  No polyuria, polydipsia, polyphagia or blurred vision.  ED course: Initial vital signs were temperature 98.9 F, pulse 116, respirations 16, BP 102/75 mmHg O2 sat 98% on room air.  The patient received 650 mg of acetaminophen p.o. x1 1000 mL of NS bolus, morphine 1 mg IVP, metronidazole 500 mg IVPB and ceftriaxone 2 g IVPB.  Lab work: His urinalysis was hazy with ketonuria 5 and proteinuria of 100 mg/dL.  There was trace leukocyte esterase.  The rest of the UA resulted values are unremarkable.  CBC with a white count of 15.7, hemoglobin 16.5 g/dL platelets 185.  Repeat CBC showed a white count of 13.1, hemoglobin 14.4  g/dL and platelets 132.  Normal PT, INR and PTT.  Lactic acid is 2.9 and then 1.5 mmol/L.  CMP showed a sodium of 139, potassium 3.3, chloride 106 and CO2 21 mmol/L.  Glucose 103 and creatinine 1.35 mg/dL.  The rest of the CMP results were normal.  Imaging: Abdomen and chest radiographs were normal.  CT abdomen/pelvis with contrast show evidence of prior bowel resections with chronic fluid distended segment of small bowel in the right anterior abdomen in the region of anastomosis.  No convincing evidence for bowel obstruction at this time.  There was focal wall thickening with mucosal enhancement involving distal small bowel in the right lower quadrant with small amount of adjacent fluid and soft tissue stranding inflammation suspicious for active enteritis.  Review of Systems: As mentioned in the history of present illness. All other systems reviewed and are negative. Past Medical History:  Diagnosis Date   Acute prostatitis 07/24/2007   Qualifier: Diagnosis of  By: [name] MD, [name]    Allergy    mild   Arthritis    osteoarthritis   Asthma    Blood transfusion without reported diagnosis    BPH (benign prostatic hypertrophy) with urinary obstruction    Crohn's ileitis ([place]) suspected 05/03/2017   Dilated aortic root ([place])    noted on echo 08/2012   Diverticulitis of colon    EPIDIDYMITIS 02/15/2010   Qualifier: Diagnosis of  By: [name] MD, [name]    GERD (gastroesophageal reflux disease)    H/O: GI bleed    Hemorrhoids    Hepatitis 1975   unknown type    HERPES SIMPLEX INFECTION 10/14/2007   Qualifier:  Diagnosis of  By: [name] MD, [name]    Hiatal hernia    Ileus following gastrointestinal surgery ([place]) 12/26/2011   Long term (current) use of systemic steroids 06/18/2018   Psoriasis    sees [name] at [facility].   Recurrent ventral incisional hernia 05/10/2012   SVT (supraventricular tachycardia) ([place])    Ulcer 08/21/2013   ileal   Past Surgical History:  Procedure  Laterality Date   BOWEL RESECTION  12/19/2011   Procedure: SMALL BOWEL RESECTION;  Surgeon: [name], MD;  Location: [facility];  Service: General;  Laterality: N/A;  with anastamosis and insertion mesh   BRONCHOSCOPY     COLON SURGERY  01/2004   x 2   COLONOSCOPY W/ BIOPSIES  04/26/2017   per [name], no polyps, benign inflammation, repeat in 5 yrs    CYSTOSCOPY     ESOPHAGOGASTRODUODENOSCOPY     HEMICOLECTOMY     left side, at [facility], diverticulitis   [place]     [phone] incisional hernia   ILEOSTOMY     ILEOSTOMY CLOSURE     INSERTION OF MESH  07/31/2012   Procedure: INSERTION OF MESH;  Surgeon: [name], MD;  Location: [facility];  Service: General;;   LAPAROTOMY  12/19/2011   Procedure: EXPLORATORY LAPAROTOMY;  Surgeon: [name], MD;  Location: [facility];  Service: General;  Laterality: N/A;   PACEMAKER IMPLANT N/A 12/01/2020   Procedure: PACEMAKER IMPLANT;  Surgeon: [name], MD;  Location: [place] [facility];  Service: Cardiovascular;  Laterality: N/A;   PACEMAKER INSERTION Left    TONSILLECTOMY     UPPER GASTROINTESTINAL ENDOSCOPY     VASECTOMY     VENTRAL HERNIA REPAIR  07/31/2012   Procedure: HERNIA REPAIR VENTRAL ADULT;  Surgeon: [name], MD;  Location: [facility];  Service: General;  Laterality: N/A;   Social History:  reports that he has never smoked. He has never used smokeless tobacco. He reports current alcohol use. He reports that he does not use drugs.  Allergies  Allergen Reactions   Mercaptopurine Other (See Comments)    Pancreatitis    Shellfish Allergy Anaphylaxis    Throat started to close   Humira [Adalimumab]     Developed antibodies   Wound Dressing Adhesive Rash    Family History  Problem Relation Age of Onset   Lung cancer Mother    Leukemia Father    Hypertension Father    Heart disease Father    Heart attack Father    Prostate cancer Father    Prostate cancer  Paternal Uncle    Colon cancer Neg Hx    Stomach cancer Neg Hx    Colon polyps Neg Hx    Esophageal cancer Neg Hx    Rectal cancer Neg Hx     Prior to Admission medications   Medication Sig Start Date End Date Taking? Authorizing Provider  diphenoxylate-atropine (LOMOTIL) 2.5-0.025 MG tablet 1-2 tablets 4 times a day as neded Patient taking differently: Take 1-2 tablets by mouths 4 times a day as needed for diarrhea 08/15/21  Yes Gatha Mayer, MD  esomeprazole (NEXIUM) 20 MG capsule Take 20 mg by mouth daily as needed (acid reflux).   Yes [provider]  fluticasone (CUTIVATE) 0.05 % cream Apply 1 application topically 2 (two) times daily as needed for irritation. 11/02/20  Yes [provider]  halobetasol (ULTRAVATE) 0.05 % ointment Apply  topically 2 (two) times daily. Patient taking differently: Apply 1 application. topically daily as needed (for psoriasis). 01/08/18  Yes [name], MD  hyoscyamine (LEVSIN SL) 0.125 MG SL tablet PLACE 1 TABLET UNDER THE TONGUE EVERY 4 HOURS AS NEEDED FOR CRAMPING (URGENT DEFECATION) Patient taking differently: Take 0.125 mg by mouth every 4 (four) hours as needed for cramping (for cramping (urgent defecation)). 08/15/21  Yes Gatha Mayer, MD  ketoconazole (NIZORAL) 2 % cream APPLY TWICE A DAY AS NEEDED FOR FUNGAL INFECTION Patient taking differently: Apply 1 application. topically 2 (two) times daily as needed for irritation. APPLY TWICE A DAY AS NEEDED FOR FUNGAL INFECTION 08/16/20  Yes [name], MD  losartan (COZAAR) 25 MG tablet TAKE 1 TABLET DAILY Patient taking differently: Take 25 mg by mouth daily. 08/18/21  Yes Josue Hector, MD  Multiple Vitamins-Minerals (MULTIVITAMIN WITH MINERALS) tablet Take 1 tablet by mouth daily.   Yes [provider]  traZODone (DESYREL) 50 MG tablet Take 1 tablet (50 mg total) by mouth at bedtime. 04/12/21  Yes [name], MD  Turmeric 500 MG CAPS Take 500 mg by mouth daily.   Yes  [provider]  vedolizumab (ENTYVIO) 300 MG injection INFUSE 300 MG IV every 8 weeks Patient taking differently: Inject 300 mg into the vein every 28 (twenty-eight) days. Every 4 weeks 08/26/21  Yes Gatha Mayer, MD  albuterol (PROVENTIL HFA;VENTOLIN HFA) 108 (90 Base) MCG/ACT inhaler INHALE 2 PUFFS INTO THE LUNGS EVERY 4 (FOUR) HOURS AS NEEDED FOR WHEEZING OR SHORTNESS OF BREATH. Patient not taking: Reported on 09/21/2021 10/24/18   [name], MD  EPINEPHrine (EPIPEN 2-PAK) 0.3 mg/0.3 mL IJ SOAJ injection Inject 0.3 mLs (0.3 mg total) into the muscle as needed for anaphylaxis. Patient not taking: Reported on 09/21/2021 11/09/18   Sherwood Gambler, MD  ondansetron (ZOFRAN-ODT) 4 MG disintegrating tablet 56m ODT q4 hours prn nausea/vomit Patient not taking: Reported on 09/21/2021 08/30/21   FDeno Etienne DO  pramoxine-hydrocortisone ([facility] 1-1 % rectal cream Place 1 application rectally 2 (two) times daily. Use twice daily before SITZ baths Patient not taking: Reported on 09/21/2021 04/21/19   JMilus Banister MD  tadalafil (CIALIS) 5 MG tablet Take 1 tablet (5 mg total) by mouth daily. Patient not taking: Reported on 09/21/2021 11/23/17   FLaurey Morale MD    Physical Exam: Vitals:   09/21/21 0745 09/21/21 0800 09/21/21 0825 09/21/21 0900  BP: 112/64 (!) 118/59 114/64 121/63  Pulse: 65 63 66 65  Resp: 13 12 14 11   Temp:   97.8 F (36.6 C)   TempSrc:   Oral   SpO2: 94% 94% 98% 96%  Weight:      Height:       Physical Exam Constitutional:      Appearance: He is obese.  HENT:     Head: Normocephalic.     Mouth/Throat:     Mouth: Mucous membranes are dry.  Eyes:     Pupils: Pupils are equal, round, and reactive to light.  Neck:     Vascular: No JVD.  Cardiovascular:     Rate and Rhythm: Normal rate and regular rhythm.  Pulmonary:     Effort: Pulmonary effort is normal.     Breath sounds: Normal breath sounds. No wheezing, rhonchi or rales.  Abdominal:     General:  Bowel sounds are normal.     Palpations: Abdomen is soft.     Tenderness: There is abdominal  tenderness in the right upper quadrant, right lower quadrant and epigastric area. There is right CVA tenderness. There is no guarding or rebound.  Musculoskeletal:     Cervical back: Neck supple.     Right lower leg: No edema.     Left lower leg: No edema.  Skin:    General: Skin is warm and dry.     Coloration: Skin is not jaundiced.  Neurological:     General: No focal deficit present.     Mental Status: He is alert and oriented to person, place, and time.     Cranial Nerves: No cranial nerve deficit.     Motor: No weakness.  Psychiatric:        Mood and Affect: Mood normal.        Behavior: Behavior normal.    Data Reviewed:  There are no new results to review at this time.  Assessment and Plan: Principal Problem:   Sepsis due to undetermined organism POA ([place]) Admit to telemetry/inpatient. Keep n.p.o. for now. Continue IV fluids. Analgesics as needed. Antiemetics as needed. Parenteral PPI. Continue ceftriaxone 1 g every 24 hours. Continue metronidazole 500 mg IVPB every 12 hours. Follow-up blood culture and sensitivity. Monitor CBC and chemistry.  Active Problems:   Crohn's ileitis ([place])  Deferred glucocorticoids for now. Analgesics as needed. GI has been consulted.    Asthma Supplemental oxygen and bronchodilators as needed.    GERD Pantoprazole 40 mg IVPB every 24 hours.    Thrombocytopenia (HCC) Monitor platelet count.    Hypocalcemia/hypophosphatemia History of vitamin D deficiency. Replace phosphorus as needed. Follow-up calcium level.    Hypokalemia Corrected. We will optimize level. Magnesium was supplemented.      Advance Care Planning:   Code Status: Full Code   Consults: GI Silvano Rusk, MD)  Family Communication:   Severity of Illness: The appropriate patient status for this patient is OBSERVATION. Observation status is judged to be  reasonable and necessary in order to provide the required intensity of service to ensure the patient's safety. The patient's presenting symptoms, physical exam findings, and initial radiographic and laboratory data in the context of their medical condition is felt to place them at decreased risk for further clinical deterioration. Furthermore, it is anticipated that the patient will be medically stable for discharge from the hospital within 2 midnights of admission.   Author: Reubin Milan, MD 09/21/2021 9:54 AM  For on call review [email or web address].   This document was prepared using Dragon voice recognition software and may contain some unintended transcription errors.

## 2021-09-21 NOTE — Telephone Encounter (Signed)
Spoke to patient and reviewed his situation.  He is admitted he may have been septic its not entirely clear.  This occurred immediately after his colonoscopy.  Explained that I have seen once in my career a situation where propofol caused something similar though that is not noted to be an issue with more modern preparations of propofol.  This is a bit of a mystery at this point he is improved and we will follow him in the hospital with our hospital team. ?

## 2021-09-21 NOTE — Consult Note (Addendum)
Consultation Note   Referring Provider: [facility] PCP: [name], MD Gastroenterologist: Silvano Rusk, MD  Reason for consultation:   Crohn's. Enteritis on CT scan. 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 Findings of Crohn's ileitis on colonoscopy yesterday. Post procedure had chills / fevers, periumbilical pain .  Admitted through ED for sepsis ( ? Source) . WBC 16.7 ( now be due in part to recent steroids).  CTAP shows focal wall thickening with mucosal enhancement involving distal small bowel in RLQ with small amount of adjacent fluid and soft tissue stranding /inflammation, suspicious for active enteritis. --He isn't having any nausea / vomiting or diarrhea to suggest infectious enteritis. Source of sepsis not clear at this point. Urine and blood cultures pending. Negative abdominal xrays and CXR --Continue Rocephin and flagyl. WBC improving >> 13.  --May try him on clears later today.  --Recent flare symptoms, elevated calprotectin and low Entyvio drug level (but no drug antibodies). Entyvio dosing interval recently decreased from Q 8 weeks to Q 4 weeks ( hasn't yet started new dosing schedule). Last dose was 4 weeks ago.    # Additional medical history listed below.    History of Present Illness:   Patient Profile:   [name] is a 71 y.o. male with a past medical history significant for small bowel Crohn's disease, multiple hernia repairs and mesh, left colon resection and small bowel resections ( not related to Crohn's), GERD, asthma   See PMH for any additional medical problems.   History relevant to this admission:   08/15/21 - office visit. Several month history of increasing stool frequency,  bilateral lower quadrant abdominal pain. On Entyvio infusions Q8 weeks. Presentation concerning for loss of response to treatment.  Fecal calprotectin was elevated,  Entyvio levels low but no antibodies. Entyvio dosing changed to Q 4 weeks. [place] wouldn't do the infusion Q weeks since dose was not FDA approved.   08/30/21 ED visit for increasing pain. CT scan showe some increased dilation of small bowel loops. Given Solumedraol and felt better. We started him on steroid taper and scheduled for a colonoscopy   09/20/21 After getting home from colonoscopy yesterday patient took his dog for a walk. Following that he tried to eat but developed severe periumbilical pain followed by chills. Spoke with our MD on call who advised medication for cramping, heating pad, tylenol and to ED if not better. Developed fever of 102, came to ED. He hasn't had any nausea, vomiting or diarrhea.    Previous GI Evaluations:    Endoscopies:  09/20/21 Colonoscopy with TI intubation --Crohn's disease with ileitis. Inflammation characterized by shallow ulcerations was found in the distal ileum. Biopsies were taken with a cold forceps for histology. May not have captured with biopsies though I tried. - Diverticulosis in the right colon. - The examination was otherwise normal on direct and retroflexion views. This is improved vs 2018 exam   Diagnostic studies this admission    Imaging:   CT Abdomen Pelvis W Contrast  Result Date: 09/20/2021 CLINICAL DATA:  Diffuse abdominal pain and distension, fever  after colonoscopy EXAM: CT ABDOMEN AND PELVIS WITH CONTRAST TECHNIQUE: Multidetector CT imaging of the abdomen and pelvis was performed using the standard protocol following bolus administration of intravenous contrast. RADIATION DOSE REDUCTION: This exam was performed according to the departmental dose-optimization program which includes automated exposure control, adjustment of the mA and/or kV according to patient size and/or use of iterative reconstruction technique. CONTRAST:  142m OMNIPAQUE IOHEXOL 300 MG/ML  SOLN COMPARISON:  CT, 08/30/2021, 01/06/2021, 09/06/2018 FINDINGS: Lower chest:  Lung bases demonstrate no acute consolidation or effusion. Stable right lower lobe subpleural nodularity. Partially visualized cardiac pacing leads. Hepatobiliary: No focal liver abnormality is seen. No gallstones, gallbladder wall thickening, or biliary dilatation. Pancreas: Unremarkable. No pancreatic ductal dilatation or surrounding inflammatory changes. Spleen: Normal in size without focal abnormality. Adrenals/Urinary Tract: Adrenal glands are unremarkable. Kidneys are normal, without renal calculi, focal lesion, or hydronephrosis. Bladder is decompressed Stomach/Bowel: Stomach nonenlarged. Status post bowel resection. Chronic fluid distended segment of bowel near the right anterior abdominal anastomosis. No convincing evidence for bowel obstruction. Mild wall thickening and mucosal enhancement of right lower quadrant distal small bowel, series 2, image 53. Mild fluid and surrounding inflammatory process. Negative appendix. Vascular/Lymphatic: Nonaneurysmal aorta. Mild atherosclerosis. No suspicious lymph nodes Reproductive: Slightly enlarged prostate Other: Negative for pelvic effusion or free air. Small fat containing inguinal hernias. Musculoskeletal: No acute osseous abnormality. IMPRESSION: 1. Evidence of prior bowel resections with chronic fluid distended segment of small bowel in the right anterior abdomen in the region of anastomosis. No convincing evidence for a bowel obstruction at this time. 2. Focal wall thickening with mucosal enhancement involving distal small bowel in the right lower quadrant with small amount of adjacent fluid and soft tissue stranding/inflammation, suspicious for active enteritis. Electronically Signed   By: KDonavan FoilM.D.   On: 09/20/2021 22:31   DG Abd Acute W/Chest  Result Date: 09/20/2021 CLINICAL DATA:  Status post colonoscopy today with abdominal pain, fever and chills. EXAM: DG ABDOMEN ACUTE WITH 1 VIEW CHEST COMPARISON:  January 04, 2021 FINDINGS: There is no  evidence of dilated bowel loops or free intraperitoneal air. No radiopaque calculi are seen. Ill-defined surgical sutures are seen overlying the mid right abdomen. Radiopaque surgical clips are seen within pelvis on the left. Heart size and mediastinal contours are within normal limits. There is a dual lead AICD. Both lungs are clear. IMPRESSION: Negative abdominal radiographs. No acute cardiopulmonary disease. Electronically Signed   By: TVirgina NorfolkM.D.   On: 09/20/2021 20:51     Past Medical History:  Diagnosis Date   Acute prostatitis 07/24/2007   Qualifier: Diagnosis of  By: FSarajane JewsMD, SIshmael Holter   Allergy    mild   Arthritis    osteoarthritis   Asthma    Blood transfusion without reported diagnosis    BPH (benign prostatic hypertrophy) with urinary obstruction    Cro[facility]'s ileitis ([facility] suspected 05/03/2017   Dilated aortic root ([facility]    noted on echo 08/2012   Diverticulitis of colon    EPIDIDYMITIS 02/15/2010   Qualifier: Diagnosis of  By: FSarajane JewsMD, SIshmael Holter   GERD (gastroesophageal reflux disease)    H/O: GI bleed    Hemorrhoids    Hepatitis 1975   unknown type    HERPES SIMPLEX INFECTION 10/14/2007   Qualifier: Diagnosis of  By: FSarajane JewsMD, SAnnie MainA    Hiatal hernia    Ileus following gastrointestinal surgery ([facility] 12/26/2011   Long term (current) use of systemic steroids  06/18/2018   Psoriasis    sees [name] at [facility].   Recurrent ventral incisional hernia 05/10/2012   SVT (supraventricular tachycardia) ([place])    Ulcer 08/21/2013   ileal    Past Surgical History:  Procedure Laterality Date   BOWEL RESECTION  12/19/2011   Procedure: SMALL BOWEL RESECTION;  Surgeon: [name], MD;  Location: [facility];  Service: General;  Laterality: N/A;  with anastamosis and insertion mesh   BRONCHOSCOPY     COLON SURGERY  01/2004   x 2   COLONOSCOPY W/ BIOPSIES  04/26/2017   per [name], no polyps, benign inflammation, repeat in 5 yrs    CYSTOSCOPY      ESOPHAGOGASTRODUODENOSCOPY     HEMICOLECTOMY     left side, at [facility], diverticulitis   [place]     [phone] incisional hernia   ILEOSTOMY     ILEOSTOMY CLOSURE     INSERTION OF MESH  07/31/2012   Procedure: INSERTION OF MESH;  Surgeon: [name], MD;  Location: [facility];  Service: General;;   LAPAROTOMY  12/19/2011   Procedure: EXPLORATORY LAPAROTOMY;  Surgeon: [name], MD;  Location: [facility];  Service: General;  Laterality: N/A;   PACEMAKER IMPLANT N/A 12/01/2020   Procedure: PACEMAKER IMPLANT;  Surgeon: [name], MD;  Location: [facility];  Service: Cardiovascular;  Laterality: N/A;   PACEMAKER INSERTION Left    TONSILLECTOMY     UPPER GASTROINTESTINAL ENDOSCOPY     VASECTOMY     VENTRAL HERNIA REPAIR  07/31/2012   Procedure: HERNIA REPAIR VENTRAL ADULT;  Surgeon: [name], MD;  Location: [facility];  Service: General;  Laterality: N/A;    Prior to Admission medications   Medication Sig Start Date End Date Taking? Authorizing Provider  diphenoxylate-atropine (LOMOTIL) 2.5-0.025 MG tablet 1-2 tablets 4 times a day as neded Patient taking differently: Take 1-2 tablets by mouths 4 times a day as needed for diarrhea 08/15/21  Yes Gatha Mayer, MD  esomeprazole (NEXIUM) 20 MG capsule Take 20 mg by mouth daily as needed (acid reflux).   Yes [provider]  fluticasone (CUTIVATE) 0.05 % cream Apply 1 application topically 2 (two) times daily as needed for irritation. 11/02/20  Yes [provider]  halobetasol (ULTRAVATE) 0.05 % ointment Apply topically 2 (two) times daily. Patient taking differently: Apply 1 application. topically daily as needed (for psoriasis). 01/08/18  Yes [name], MD  hyoscyamine (LEVSIN SL) 0.125 MG SL tablet PLACE 1 TABLET UNDER THE TONGUE EVERY 4 HOURS AS NEEDED FOR CRAMPING (URGENT DEFECATION) Patient taking differently: Take 0.125 mg by mouth every 4 (four) hours as  needed for cramping (for cramping (urgent defecation)). 08/15/21  Yes Gatha Mayer, MD  ketoconazole (NIZORAL) 2 % cream APPLY TWICE A DAY AS NEEDED FOR FUNGAL INFECTION Patient taking differently: Apply 1 application. topically 2 (two) times daily as needed for irritation. APPLY TWICE A DAY AS NEEDED FOR FUNGAL INFECTION 08/16/20  Yes [name], MD  losartan (COZAAR) 25 MG tablet TAKE 1 TABLET DAILY Patient taking differently: Take 25 mg by mouth daily. 08/18/21  Yes Josue Hector, MD  Multiple Vitamins-Minerals (MULTIVITAMIN WITH MINERALS) tablet Take 1 tablet by mouth daily.   Yes [provider]  traZODone (DESYREL) 50 MG tablet Take 1 tablet (50 mg total) by mouth at bedtime. 04/12/21  Yes [name], MD  Turmeric 500  MG CAPS Take 500 mg by mouth daily.   Yes [provider]  vedolizumab (ENTYVIO) 300 MG injection INFUSE 300 MG IV every 8 weeks Patient taking differently: Inject 300 mg into the vein every 28 (twenty-eight) days. Every 4 weeks 08/26/21  Yes Gatha Mayer, MD  albuterol (PROVENTIL HFA;VENTOLIN HFA) 108 (90 Base) MCG/ACT inhaler INHALE 2 PUFFS INTO THE LUNGS EVERY 4 (FOUR) HOURS AS NEEDED FOR WHEEZING OR SHORTNESS OF BREATH. Patient not taking: Reported on 09/21/2021 10/24/18   [name], MD  EPINEPHrine (EPIPEN 2-PAK) 0.3 mg/0.3 mL IJ SOAJ injection Inject 0.3 mLs (0.3 mg total) into the muscle as needed for anaphylaxis. Patient not taking: Reported on 09/21/2021 11/09/18   Sherwood Gambler, MD  ondansetron (ZOFRAN-ODT) 4 MG disintegrating tablet 32m ODT q4 hours prn nausea/vomit Patient not taking: Reported on 09/21/2021 08/30/21   FDeno Etienne DO  pramoxine-hydrocortisone ([facility] 1-1 % rectal cream Place 1 application rectally 2 (two) times daily. Use twice daily before SITZ baths Patient not taking: Reported on 09/21/2021 04/21/19   JMilus Banister MD  tadalafil (CIALIS) 5 MG tablet Take 1 tablet (5 mg total) by mouth daily. Patient not taking:  Reported on 09/21/2021 11/23/17   FLaurey Morale MD    Current Facility-Administered Medications  Medication Dose Route Frequency Provider Last Rate Last Admin   0.9 % NaCl with KCl 40 mEq / L  infusion   Intravenous Continuous OReubin Milan MD 250 mL/hr at 09/21/21 1010 New Bag at 09/21/21 1010   acetaminophen (TYLENOL) tablet 650 mg  650 mg Oral Q6H PRN OReubin Milan MD       Or   acetaminophen (TYLENOL) suppository 650 mg  650 mg Rectal Q6H PRN OReubin Milan MD       cefTRIAXone (ROCEPHIN) 2 g in sodium chloride 0.9 % 100 mL IVPB  2 g Intravenous Q24H OReubin Milan MD       HYDROmorphone (DILAUDID) injection 1 mg  1 mg Intravenous Q4H PRN OReubin Milan MD       metroNIDAZOLE (FLAGYL) IVPB 500 mg  500 mg Intravenous Q12H OReubin Milan MD       ondansetron ([facility] tablet 4 mg  4 mg Oral Q6H PRN OReubin Milan MD       Or   ondansetron ([facility] injection 4 mg  4 mg Intravenous Q6H PRN OReubin Milan MD       pantoprazole (PROTONIX) injection 40 mg  40 mg Intravenous Q24H OReubin Milan MD   40 mg at 09/21/21 04650  Current Outpatient Medications  Medication Sig Dispense Refill   diphenoxylate-atropine (LOMOTIL) 2.5-0.025 MG tablet 1-2 tablets 4 times a day as neded (Patient taking differently: Take 1-2 tablets by mouths 4 times a day as needed for diarrhea) 90 tablet 1   esomeprazole (NEXIUM) 20 MG capsule Take 20 mg by mouth daily as needed (acid reflux).     fluticasone (CUTIVATE) 0.05 % cream Apply 1 application topically 2 (two) times daily as needed for irritation.     halobetasol (ULTRAVATE) 0.05 % ointment Apply topically 2 (two) times daily. (Patient taking differently: Apply 1 application. topically daily as needed (for psoriasis).) 50 g 5   hyoscyamine (LEVSIN SL) 0.125 MG SL tablet PLACE 1 TABLET UNDER THE TONGUE EVERY 4 HOURS AS NEEDED FOR CRAMPING (URGENT DEFECATION) (Patient taking differently: Take 0.125 mg by mouth  every 4 (four) hours as needed for cramping (for cramping (urgent defecation)).) 60  tablet 2   ketoconazole (NIZORAL) 2 % cream APPLY TWICE A DAY AS NEEDED FOR FUNGAL INFECTION (Patient taking differently: Apply 1 application. topically 2 (two) times daily as needed for irritation. APPLY TWICE A DAY AS NEEDED FOR FUNGAL INFECTION) 30 g 5   losartan (COZAAR) 25 MG tablet TAKE 1 TABLET DAILY (Patient taking differently: Take 25 mg by mouth daily.) 90 tablet 0   Multiple Vitamins-Minerals (MULTIVITAMIN WITH MINERALS) tablet Take 1 tablet by mouth daily.     traZODone (DESYREL) 50 MG tablet Take 1 tablet (50 mg total) by mouth at bedtime. 90 tablet 3   Turmeric 500 MG CAPS Take 500 mg by mouth daily.     vedolizumab (ENTYVIO) 300 MG injection INFUSE 300 MG IV every 8 weeks (Patient taking differently: Inject 300 mg into the vein every 28 (twenty-eight) days. Every 4 weeks) 1 each 6   albuterol (PROVENTIL HFA;VENTOLIN HFA) 108 (90 Base) MCG/ACT inhaler INHALE 2 PUFFS INTO THE LUNGS EVERY 4 (FOUR) HOURS AS NEEDED FOR WHEEZING OR SHORTNESS OF BREATH. (Patient not taking: Reported on 09/21/2021) 8.5 Inhaler 2   EPINEPHrine (EPIPEN 2-PAK) 0.3 mg/0.3 mL IJ SOAJ injection Inject 0.3 mLs (0.3 mg total) into the muscle as needed for anaphylaxis. (Patient not taking: Reported on 09/21/2021) 1 Device 0   ondansetron (ZOFRAN-ODT) 4 MG disintegrating tablet 68m ODT q4 hours prn nausea/vomit (Patient not taking: Reported on 09/21/2021) 20 tablet 0   pramoxine-hydrocortisone (ANALPRAM-HC) 1-1 % rectal cream Place 1 application rectally 2 (two) times daily. Use twice daily before SITZ baths (Patient not taking: Reported on 09/21/2021) 30 g 0   tadalafil (CIALIS) 5 MG tablet Take 1 tablet (5 mg total) by mouth daily. (Patient not taking: Reported on 09/21/2021) 90 tablet 3    Allergies as of 09/20/2021 - Review Complete 09/20/2021  Allergen Reaction Noted   Mercaptopurine Other (See Comments) 10/08/2017   Shellfish allergy  Anaphylaxis 12/12/2011   Humira [adalimumab]  03/20/2019   Wound dressing adhesive Rash 12/30/2020    Family History  Problem Relation Age of Onset   Lung cancer Mother    Leukemia Father    Hypertension Father    Heart disease Father    Heart attack Father    Prostate cancer Father    Prostate cancer Paternal Uncle    Colon cancer Neg Hx    Stomach cancer Neg Hx    Colon polyps Neg Hx    Esophageal cancer Neg Hx    Rectal cancer Neg Hx     Social History   Socioeconomic History   Marital status: Married    Spouse name: Not on file   Number of children: 2   Years of education: Not on file   Highest education level: Not on file  Occupational History   Occupation: EHS mFreight forwarder   Employer: [employer]  Tobacco Use   Smoking status: Never   Smokeless tobacco: Never  Vaping Use   Vaping Use: Never used  Substance and Sexual Activity   Alcohol use: Yes    Alcohol/week: 0.0 standard drinks    Comment: occ   Drug use: No   Sexual activity: Not on file  Other Topics Concern   Not on file  Social History Narrative   He is married with 2 sons 1 son is an eArt gallery managerand the [place] and 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   He is eNurse, 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   OBJECTIVE    Physical Exam: Vital signs in last 24 hours: Temp:  [97.3 F (36.3 C)-98.9 F (37.2 C)] 97.8 F (36.6 C) (03/08 0825) Pulse Rate:  [60-116] 64 (03/08 1100) Resp:  [0-23] 14  (03/08 1100) BP: (78-146)/(40-77) 115/62 (03/08 1100) SpO2:  [94 %-100 %] 94 % (03/08 1100) Weight:  [94.3 kg] 94.3 kg (03/07 2012)    General:  Alert male in NAD Psych:  Pleasant, cooperative. Normal mood and affect Eyes: Pupils equal, no icterus. Conjunctive pink Ears:  Normal auditory acuity Nose: No deformity, discharge or lesions Neck:  Supple, no masses felt Lungs:  Clear to auscultation.  Heart:  Regular rate, regular rhythm. No lower extremity edema Abdomen:  Soft, nondistended, moderate right mid and RLQ tenderness. A few bowel sounds Rectal :  Deferred Msk: Symmetrical without gross deformities.  Neurologic:  Alert, oriented, grossly normal neurologically Skin:  Intact without significant lesions.    Scheduled inpatient medications  pantoprazole (PROTONIX) IV  40 mg Intravenous Q24H      Intake/Output from previous day: No intake/output data recorded. Intake/Output this shift: No intake/output data recorded.   Lab Results: Recent Labs    09/20/21 2024 09/21/21 0840  WBC 15.7* 13.1*  HGB 16.5 14.4  HCT 50.7 44.5  PLT 185 132*   BMET Recent Labs    09/20/21 2024 09/21/21 0840  NA 139 138  K 3.3* 3.7  CL 106 107  CO2 21* 23  GLUCOSE 103* 85  BUN 11 10  CREATININE 1.35* 0.92  CALCIUM 9.1 8.0*   LFTs Recent Labs    09/20/21 2024  PROT 7.2  ALBUMIN 4.3  AST 38  ALT 25  ALKPHOS 53  BILITOT 1.1   PT/INR Recent Labs    09/20/21 2024  LABPROT 14.2  INR 1.1   Hepatitis Panel No results for input(s): HEPBSAG, HCVAB, HEPAIGM, HEPBIGM in the last 72 hours.   . CBC Latest Ref Rng & Units 09/21/2021 09/20/2021 08/30/2021  WBC 4.0 - 10.5 K/uL 13.1(H) 15.7(H) 13.3(H)  Hemoglobin 13.0 - 17.0 g/dL 14.4 16.5 16.7  Hematocrit 39.0 - 52.0 % 44.5 50.7 50.0  Platelets 150 - 400 K/uL 132(L) 185 240    . CMP Latest Ref Rng & Units 09/21/2021 09/20/2021 08/30/2021  Glucose 70 - 99 mg/dL 85 103(H) 97  BUN 8 - 23 mg/dL 10 11 13   Creatinine 0.61 - 1.24  mg/dL 0.92 1.35(H) 1.04  Sodium 135 - 145 mmol/L 138 139 136  Potassium 3.5 - 5.1 mmol/L 3.7 3.3(L) 4.3  Chloride 98 - 111 mmol/L 107 106 106  CO2 22 - 32 mmol/L 23 21(L) 21(L)  Calcium 8.9 - 10.3 mg/dL 8.0(L) 9.1 8.9  Total Protein 6.5 - 8.1 g/dL - 7.2 7.3  Total Bilirubin 0.3 - 1.2 mg/dL - 1.1 0.9  Alkaline Phos 38 - 126 U/L - 53 51  AST 15 - 41 U/L - 38 34  ALT 0 - 44 U/L - 25 20  Tye Savoy, NP-C @  09/21/2021, 11:21 AM _______________________________________________________________________________________________________________________________________  Velora Heckler GI MD note:  I personally examined the patient, reviewed the data and agree with the assessment and plan described above.  I provided a substantive portion of the care of this patient (personally provided more than half of the total time dedicated to the treatment of this patient). He has longstanding Crohn's ileitis, has had many abdominal surgeries. Seems like he's had a flare of his disease for at least a few weeks (entivio trough level was lower than ideal, fortunately not antibodies) and plans were set to increase his dose frequency to every 4 weeks (from the usual every 8 weeks). Something happened yesterday around the colonoscopy. Perhaps a microperf? Perhaps air distension causing serious pains (however that would not account for his fever at home to 102).  Propofol can (rarely) cause fevers, chills but shouldn't cause his baseline pains to be so much worse.  CT last night does show pretty clear inflammation of his distal small bowel that is quite consistent with Crohn's inflammation.  I think it is probably safest to cover for both infection (with IV abx) and IBD related inflammation (steroids).  I discussed this with [name] who knows him quite well and we agree.  IV abx already ordered. I will order solumedrol 96m BID now and I am going to let him try clear liquids for now.  We will follow along.   DOwens Loffler MD [facility] Pager [phone]

## 2021-09-22 ENCOUNTER — Ambulatory Visit: Payer: Medicare Other

## 2021-09-22 ENCOUNTER — Telehealth: Payer: Self-pay

## 2021-09-22 DIAGNOSIS — R1084 Generalized abdominal pain: Secondary | ICD-10-CM | POA: Diagnosis not present

## 2021-09-22 DIAGNOSIS — K50019 Crohn's disease of small intestine with unspecified complications: Secondary | ICD-10-CM | POA: Diagnosis not present

## 2021-09-22 DIAGNOSIS — A419 Sepsis, unspecified organism: Secondary | ICD-10-CM | POA: Diagnosis not present

## 2021-09-22 DIAGNOSIS — D696 Thrombocytopenia, unspecified: Secondary | ICD-10-CM

## 2021-09-22 LAB — CBC
HCT: 44.4 % (ref 39.0–52.0)
Hemoglobin: 14.7 g/dL (ref 13.0–17.0)
MCH: 29.6 pg (ref 26.0–34.0)
MCHC: 33.1 g/dL (ref 30.0–36.0)
MCV: 89.3 fL (ref 80.0–100.0)
Platelets: 142 10*3/uL — ABNORMAL LOW (ref 150–400)
RBC: 4.97 MIL/uL (ref 4.22–5.81)
RDW: 13 % (ref 11.5–15.5)
WBC: 10.9 10*3/uL — ABNORMAL HIGH (ref 4.0–10.5)
nRBC: 0 % (ref 0.0–0.2)

## 2021-09-22 LAB — URINE CULTURE: Culture: NO GROWTH

## 2021-09-22 LAB — HIV ANTIBODY (ROUTINE TESTING W REFLEX): HIV Screen 4th Generation wRfx: NONREACTIVE

## 2021-09-22 MED ORDER — PANTOPRAZOLE SODIUM 40 MG PO TBEC
40.0000 mg | DELAYED_RELEASE_TABLET | Freq: Every day | ORAL | Status: DC
  Administered 2021-09-23 – 2021-09-24 (×2): 40 mg via ORAL
  Filled 2021-09-22 (×2): qty 1

## 2021-09-22 MED ORDER — MORPHINE SULFATE (PF) 4 MG/ML IV SOLN: 4.0000 mg | INTRAVENOUS | Status: DC | PRN

## 2021-09-22 MED ORDER — K PHOS MONO-SOD PHOS DI & MONO 155-852-130 MG PO TABS
500.0000 mg | ORAL_TABLET | Freq: Two times a day (BID) | ORAL | Status: AC
  Administered 2021-09-22 (×2): 500 mg via ORAL
  Filled 2021-09-22 (×2): qty 2

## 2021-09-22 MED ORDER — HYDROCODONE-ACETAMINOPHEN 5-325 MG PO TABS
2.0000 | ORAL_TABLET | ORAL | Status: DC | PRN
  Administered 2021-09-22 – 2021-09-24 (×9): 2 via ORAL
  Filled 2021-09-22 (×9): qty 2

## 2021-09-22 NOTE — Progress Notes (Signed)

## 2021-09-22 NOTE — Assessment & Plan Note (Addendum)
Possibly due to sepsis, now resolved. ? ?

## 2021-09-22 NOTE — Assessment & Plan Note (Addendum)
Has remained afebrile tolerating his diet. ?Change antibiotics and steroids to orals per GI recommendations. ? ?

## 2021-09-22 NOTE — Telephone Encounter (Signed)
?  Follow up Call- ? ?Call back number 09/20/2021  ?Post procedure Call Back phone  # [phone]  ?Permission to leave phone message Yes  ?Some recent data might be hidden  ?  ? ?Patient questions: ? ?Do you have a fever, pain , or abdominal swelling? Yes.   ?Pain Score  0 * ? ?Have you tolerated food without any problems? No. ? ?Have you been able to return to your normal activities? No. ? ?Do you have any questions about your discharge instructions: ?Diet   No. ?Medications  No. ?Follow up visit  No. ? ?Do you have questions or concerns about your Care? Yes.   ? ?Spoke with pt this morning and he reports that he is at [facility], admitted the night after the procedure as he had called the on call doctor reporting fever and shaking chills, Dr Rush Landmark instruced him to go to the ER. ? ?States he is being treated for possible sepsis. ? ?Actions: ?* If pain score is 4 or above: ?No action needed, pain <4. ? ?Have you developed a fever since your procedure? yes ? ?2.   Have you had an respiratory symptoms (SOB or cough) since your procedure? no ? ?3.   Have you tested positive for COVID 19 since your procedure no ? ?4.   Have you had any family members/close contacts diagnosed with the COVID 19 since your procedure?  no ? ? ?If yes to any of these questions please route to Joylene Maikel, RN and Joella Prince, RN  ? ? ?

## 2021-09-22 NOTE — Assessment & Plan Note (Addendum)
Borderline.  Replete orally. ?

## 2021-09-22 NOTE — Assessment & Plan Note (Signed)
noted 

## 2021-09-22 NOTE — Progress Notes (Signed)
?Progress Note ? ? ?Patient: [name] DGL:[id number] DOB: Aug 30, 1950 DOA: 09/20/2021     1 ?DOS: the patient was seen and examined on 09/22/2021 ?  ?Brief hospital course: ?71 year old with past medical history significant for acute prostatitis and epididymitis, Crohn's ileitis, history of prolonged steroid use dilated aortic root, history of GI bleeds and hemorrhoids psoriasis, who had a Colonoscopy the day prior to admission and unfortunately started developing abdominal cramping lightheadedness nausea chills and fever in the ED was found to be tachycardic satting 98% on room air fluid resuscitated started on Flagyl and Rocephin, white count of 15.7 hemoglobin of 16 PT and PTT unremarkable, lactic acid of 2.9, sodium at 3.3 CT scan of the abdomen and pelvis with contrast show evidence of prior bowel resection with chronic fluid distended segment of the bowel, but no convincing evidence of obstruction with some focal thickening of the mucosa of the small bowel in the right lower quadrant with a small amount of fluid and inflammatory changes suspicious for enteritis. ? ?Assessment and Plan: ?* Sepsis ([place]) ?Tmax of 98.2, his white count went from 15->10. ?Was placed n.p.o. started on IV fluids, analgesics for pain control. ?IV PPI. ?Was started on IV Rocephin and Flagyl. ?Culture data was sent and have been negative till date. ?GI was consulted. ? ?Crohn's ileitis ([place])  ?Patient was started on IV steroids further management per GI. ?No diarrhea or bloody bowel movements. ?Continue analgesics for pain control. ? ?GERD ?Continue IV PPI. ? ?Thrombocytopenia ([place]) ?Possibly due to sepsis thrombocytopenia is improving this morning is 142. ? ? ?Hypocalcemia ?Borderline low continue IV fluids recheck in the morning. ? ?Hypophosphatemia ?Borderline.  Replete orally. ? ?Hypokalemia ?Repleted 3.7 we will try to keep above 4, magnesium 1.9. ? ?Asthma ?noted ? ? ? ? ?  ? ?Subjective:  ?Relates his pain is controlled  tolerating his diet he is passing gas no diarrhea or bloody bowel movements. ? ?Physical Exam: ?Vitals:  ? 09/21/21 1635 09/21/21 2032 09/22/21 0026 09/22/21 0459  ?BP: 127/66 130/73 121/66 128/77  ?Pulse: 69 60 66 63  ?Resp: 18 16 16 16   ?Temp: 98.2 ?F (36.8 ?C) 97.8 ?F (36.6 ?C) (!) 97.5 ?F (36.4 ?C) (!) 97.4 ?F (36.3 ?C)  ?TempSrc: Oral Oral Oral Oral  ?SpO2: 97% 98% 93% 99%  ?Weight:      ?Height:      ? ?General exam: In no acute distress. ?Respiratory system: Good air movement and clear to auscultation. ?Cardiovascular system: S1 & S2 heard, RRR. No JVD. ?Gastrointestinal system: Abdomen is nondistended, soft and nontender.  ?Extremities: No pedal edema. ?Skin: No rashes, lesions or ulcers ?Psychiatry: Judgement and insight appear normal. Mood & affect appropriate. ? ?Data Reviewed: ? ?I have Reviewed nursing notes, Vitals, and Lab results since pt's last encounter. Pertinent lab results vitals which are stable I reviewed his basic metabolic panel and his potassium is improved, his phosphorus is slightly low and along with his calcium ?I have ordered test including basic metabolic panel and a phosphorus in the morning ?I have independently visualized and interpreted imaging CT scan of the abdomen that showed which showed regional enteritis. ?I have ordered imaging studies none. ?I have discussed pt's care plan and test results with patient. ? ? ?Family Communication: None ? ?Disposition: ?Status is: Inpatient ?Remains inpatient appropriate because: Sepsis of undetermined organism ? Planned Discharge Destination: Home ? ? ? ?Time spent: 35 minutes ? ?Author: ?Charlynne Cousins, MD ?09/22/2021 8:20 AM ? ?For on call review  [email or web address]  ?

## 2021-09-22 NOTE — Assessment & Plan Note (Signed)
Repleted 3.7 we will try to keep above 4, magnesium 1.9. ?

## 2021-09-22 NOTE — Assessment & Plan Note (Signed)
Continue IV PPI. ?

## 2021-09-22 NOTE — Assessment & Plan Note (Signed)
Borderline low continue IV fluids recheck in the morning. ?

## 2021-09-22 NOTE — Assessment & Plan Note (Addendum)
No further diarrhea, continue steroids by mouth. ? ?

## 2021-09-22 NOTE — Progress Notes (Addendum)
? ? ? ?Progress Note ? ?Hospital Day: 3 ? ?Chief Complaint: abdominal pain / abnormal CT scan ? ?Assessment and Plan  ? ?Brief History ?71 yo male with Crohn's ileitis on Entyvio, left colon resection and small bowel resections ( both unrelated to Crohn's), GERD. Recently he has been struggling with Crohn's flare symptoms and in progress of increasing frequency of Entyvio dose from Q 8 weeks to Q 4 weeks. Underwent colonoscopy 3/7 showing Crohn's ileitis. Post procedure developed abdominal pain / fevers / chills. Came to ED 3/8. CT scan suggesting enteritis.  ? ?   ?# Abdominal pain / fever . CT scan showing enteritis which is consistent with known Crohn's ileitis. Unclear why the sudden increase in baseline abdominal pain and development of fever / chills, ? Microperforation. Propofol can rarely cause fevers but that doesn't explain the sudden worsening of pain.  ?--Afebrile, WBC better 15.7 >> 13.1 >> 10.3 ?--Started Solumedrol yesterday ?--Though still has periumbilical pain he feel better today. ?--On IV Rocephin and Flagyl.  ?--Blood and urine culture negative so far ?--Awaiting pathology reports from colonoscopy ?--On full liquid diet, maybe advance tomorrow  ? ? ?Subjective  ? ?Still has periumbilical pain but overall feels better. No BM since admission. Son visiting. In good spirits ? ?Objective  ? ? ?Endoscopic Studies this admission ?09/20/21 Colonoscopy with TI intubation ?--Crohn's disease with ileitis. Inflammation characterized by shallow ulcerations was found in the distal ileum. Biopsies ?were taken with a cold forceps for histology. May not have captured with biopsies though I tried. ?- Diverticulosis in the right colon. ?- The examination was otherwise normal on direct and retroflexion views. This is improved vs  2018 exam ? ? ?Imaging:  ? ?CT Abdomen Pelvis W Contrast ? ?Result Date: 09/20/2021 ?CLINICAL DATA:  Diffuse abdominal pain and distension, fever after colonoscopy EXAM: CT ABDOMEN AND PELVIS  WITH CONTRAST TECHNIQUE: Multidetector CT imaging of the abdomen and pelvis was performed using the standard protocol following bolus administration of intravenous contrast. RADIATION DOSE REDUCTION: This exam was performed according to the departmental dose-optimization program which includes automated exposure control, adjustment of the mA and/or kV according to patient size and/or use of iterative reconstruction technique. CONTRAST:  141m OMNIPAQUE IOHEXOL 300 MG/ML  SOLN COMPARISON:  CT, 08/30/2021, 01/06/2021, 09/06/2018 FINDINGS: Lower chest: Lung bases demonstrate no acute consolidation or effusion. Stable right lower lobe subpleural nodularity. Partially visualized cardiac pacing leads. Hepatobiliary: No focal liver abnormality is seen. No gallstones, gallbladder wall thickening, or biliary dilatation. Pancreas: Unremarkable. No pancreatic ductal dilatation or surrounding inflammatory changes. Spleen: Normal in size without focal abnormality. Adrenals/Urinary Tract: Adrenal glands are unremarkable. Kidneys are normal, without renal calculi, focal lesion, or hydronephrosis. Bladder is decompressed Stomach/Bowel: Stomach nonenlarged. Status post bowel resection. Chronic fluid distended segment of bowel near the right anterior abdominal anastomosis. No convincing evidence for bowel obstruction. Mild wall thickening and mucosal enhancement of right lower quadrant distal small bowel, series 2, image 53. Mild fluid and surrounding inflammatory process. Negative appendix. Vascular/Lymphatic: Nonaneurysmal aorta. Mild atherosclerosis. No suspicious lymph nodes Reproductive: Slightly enlarged prostate Other: Negative for pelvic effusion or free air. Small fat containing inguinal hernias. Musculoskeletal: No acute osseous abnormality. IMPRESSION: 1. Evidence of prior bowel resections with chronic fluid distended segment of small bowel in the right anterior abdomen in the region of anastomosis. No convincing evidence  for a bowel obstruction at this time. 2. Focal wall thickening with mucosal enhancement involving distal small bowel in the right lower quadrant with small  amount of adjacent fluid and soft tissue stranding/inflammation, suspicious for active enteritis. Electronically Signed   By: Donavan Foil M.D.   On: 09/20/2021 22:31  ? ?DG Abd Acute W/Chest ? ?Result Date: 09/20/2021 ?CLINICAL DATA:  Status post colonoscopy today with abdominal pain, fever and chills. EXAM: DG ABDOMEN ACUTE WITH 1 VIEW CHEST COMPARISON:  January 04, 2021 FINDINGS: There is no evidence of dilated bowel loops or free intraperitoneal air. No radiopaque calculi are seen. Ill-defined surgical sutures are seen overlying the mid right abdomen. Radiopaque surgical clips are seen within pelvis on the left. Heart size and mediastinal contours are within normal limits. There is a dual lead AICD. Both lungs are clear. IMPRESSION: Negative abdominal radiographs. No acute cardiopulmonary disease. Electronically Signed   By: Virgina Norfolk M.D.   On: 09/20/2021 20:51   ? ? ? ?Lab Results: ?Recent Labs  ?  09/20/21 ?2024 09/21/21 ?0840 09/22/21 ?0454  ?WBC 15.7* 13.1* 10.9*  ?HGB 16.5 14.4 14.7  ?HCT 50.7 44.5 44.4  ?PLT 185 132* 142*  ? ?BMET ?Recent Labs  ?  09/20/21 ?2024 09/21/21 ?0840  ?NA 139 138  ?K 3.3* 3.7  ?CL 106 107  ?CO2 21* 23  ?GLUCOSE 103* 85  ?BUN 11 10  ?CREATININE 1.35* 0.92  ?CALCIUM 9.1 8.0*  ? ?LFT ?Recent Labs  ?  09/20/21 ?2024  ?PROT 7.2  ?ALBUMIN 4.3  ?AST 38  ?ALT 25  ?ALKPHOS 53  ?BILITOT 1.1  ? ?PT/INR ?Recent Labs  ?  09/20/21 ?2024  ?LABPROT 14.2  ?INR 1.1  ? ? ?  ? ?Scheduled inpatient medications:  ? methylPREDNISolone (SOLU-MEDROL) injection  60 mg Intravenous Q24H  ? pantoprazole  40 mg Oral Daily  ? phosphorus  500 mg Oral BID  ? traZODone  50 mg Oral QHS  ? ?Continuous inpatient infusions:  ? cefTRIAXone (ROCEPHIN)  IV Stopped (09/22/21 0107)  ? metronidazole Stopped (09/22/21 0219)  ? ?PRN inpatient medications:  acetaminophen **OR** acetaminophen, HYDROcodone-acetaminophen, morphine injection, ondansetron **OR** ondansetron (ZOFRAN) IV ? ?Vital signs in last 24 hours: ?Temp:  [97.4 ?F (36.3 ?C)-98.2 ?F (36.8 ?C)] 97.4 ?F (36.3 ?C) (03/09 0459) ?Pulse Rate:  [60-69] 63 (03/09 0459) ?Resp:  [11-18] 16 (03/09 0459) ?BP: (110-132)/(53-77) 128/77 (03/09 0459) ?SpO2:  [93 %-100 %] 99 % (03/09 0459) ?Last BM Date : 09/21/21 ? ?Intake/Output Summary (Last 24 hours) at 09/22/2021 0935 ?Last data filed at 09/22/2021 0219 ?Gross per 24 hour  ?Intake 680.66 ml  ?Output --  ?Net 680.66 ml  ? ? ? ?Physical Exam:  ?General: Alert male in NAD ?Heart:  Regular rate and rhythm. No lower extremity edema ?Pulmonary: Normal respiratory effort ?Abdomen: Soft, nondistended, mild mid abdominal tenderness. . Normal bowel sounds.  ?Neurologic: Alert and oriented ?Psych: Pleasant. Cooperative.  ? ? ? LOS: 1 day  ? ?Tye Savoy ,NP 09/22/2021, 9:35 AM ? ?_______________________________________________________________________________________________________________________________________ ? ?[facility] MD note: ? ?I personally examined the patient, reviewed the data and agree with the assessment and plan described above.  I provided a substantive portion of the care of this patient (personally provided more than half of the total time dedicated to the treatment of this patient). We are treated likely active inflammation from Crohn's ileitis (day #2 IV steroids today) as well as possible infection (Day #2 Abx).  He is clearly feeling better today already; much less abd pain, no BM since colonoscopy.  If he continues this good trend then will probably be safe to change him to PO Abx  and PO steroids tomorrow and maybe even d/c tomorrow. He will need entyvio early next week (was to have his first q 4 week infusion tomorrow but that has been postponed due to his acute illness). ? ?Will follow along ? ? ?Owens Loffler, MD ?[facility] ?Pager  [phone] ? ? ? ? ? ?

## 2021-09-22 NOTE — Progress Notes (Signed)
?  Transition of Care (TOC) Screening Note ? ? ?Patient Details  ?Name: [name] ?Date of Birth: [birth year] ? ? ?Transition of Care [facility]) CM/SW Contact:    ?Dessa Phi, RN ?Phone Number: ?09/22/2021, 10:31 AM ? ? ? ?Transition of Care Department [facility]) has reviewed patient and no TOC needs have been identified at this time. We will continue to monitor patient advancement through interdisciplinary progression rounds. If new patient transition needs arise, please place a TOC consult. ?  ?

## 2021-09-22 NOTE — Hospital Course (Signed)
71 year old with past medical history significant for acute prostatitis and epididymitis, Crohn's ileitis, history of prolonged steroid use dilated aortic root, history of GI bleeds and hemorrhoids psoriasis, who had a Colonoscopy the day prior to admission and unfortunately started developing abdominal cramping lightheadedness nausea chills and fever in the ED was found to be tachycardic satting 98% on room air fluid resuscitated started on Flagyl and Rocephin, white count of 15.7 hemoglobin of 16 PT and PTT unremarkable, lactic acid of 2.9, sodium at 3.3 CT scan of the abdomen and pelvis with contrast show evidence of prior bowel resection with chronic fluid distended segment of the bowel, but no convincing evidence of obstruction with some focal thickening of the mucosa of the small bowel in the right lower quadrant with a small amount of fluid and inflammatory changes suspicious for enteritis. ?

## 2021-09-23 ENCOUNTER — Ambulatory Visit: Payer: Medicare Other

## 2021-09-23 DIAGNOSIS — A419 Sepsis, unspecified organism: Secondary | ICD-10-CM | POA: Diagnosis not present

## 2021-09-23 DIAGNOSIS — E876 Hypokalemia: Secondary | ICD-10-CM | POA: Diagnosis not present

## 2021-09-23 DIAGNOSIS — K50019 Crohn's disease of small intestine with unspecified complications: Secondary | ICD-10-CM | POA: Diagnosis not present

## 2021-09-23 LAB — BASIC METABOLIC PANEL
Anion gap: 9 (ref 5–15)
BUN: 14 mg/dL (ref 8–23)
CO2: 22 mmol/L (ref 22–32)
Calcium: 8.4 mg/dL — ABNORMAL LOW (ref 8.9–10.3)
Chloride: 106 mmol/L (ref 98–111)
Creatinine, Ser: 0.93 mg/dL (ref 0.61–1.24)
GFR, Estimated: >60 mL/min (ref >60)
Glucose, Bld: 120 mg/dL — ABNORMAL HIGH (ref 70–99)
Potassium: 4.1 mmol/L (ref 3.5–5.1)
Sodium: 137 mmol/L (ref 135–145)

## 2021-09-23 LAB — PHOSPHORUS: Phosphorus: 3 mg/dL (ref 2.5–4.6)

## 2021-09-23 MED ORDER — CIPROFLOXACIN HCL 500 MG PO TABS
500.0000 mg | ORAL_TABLET | Freq: Two times a day (BID) | ORAL | Status: DC
  Administered 2021-09-23 – 2021-09-24 (×3): 500 mg via ORAL
  Filled 2021-09-23 (×3): qty 1

## 2021-09-23 MED ORDER — METRONIDAZOLE 500 MG PO TABS
500.0000 mg | ORAL_TABLET | Freq: Two times a day (BID) | ORAL | Status: DC
  Administered 2021-09-23 – 2021-09-24 (×2): 500 mg via ORAL
  Filled 2021-09-23 (×2): qty 1

## 2021-09-23 MED ORDER — PREDNISONE 20 MG PO TABS
40.0000 mg | ORAL_TABLET | Freq: Every day | ORAL | Status: DC
  Administered 2021-09-23 – 2021-09-24 (×2): 40 mg via ORAL
  Filled 2021-09-23 (×2): qty 2

## 2021-09-23 NOTE — Progress Notes (Addendum)
? ? ? ?Progress Note ? ?Hospital Day: 4 ? ?Chief Complaint: Crohn's disease / abdominal pain / abnormal CT scan ? ?Assessment and Plan  ? ?Brief History ?71 yo male with Crohn's ileitis on Entyvio, left colon resection and small bowel resections ( both unrelated to Crohn's), GERD. Recently he has been struggling with Crohn's flare symptoms and in progress of increasing frequency of Entyvio dose from Q 8 weeks to Q 4 weeks. Underwent colonoscopy 3/7 showing Crohn's ileitis. Post procedure developed abdominal pain / fevers / chills. Came to ED 3/8. CT scan suggesting enteritis.  ? ?   ?# Abdominal pain / fever . Enteritis on CTAP, consistent with known Crohn's disease. Unclear why the sudden increase in baseline abdominal pain and development of fever / chills, ? Microperforation. Propofol can rarely cause fevers but that doesn't explain the sudden worsening of pain.  ?--Urine and blood cultures negative to date ?--On day 3 of Solumedrol  ?--On day 3 of Rocephin and Flagyl. Afebrile, WBC down to 10.9 ?--Path report from colonoscopy is still pending ?--Tolerating regular diet, ambulating ?--Feels better day. Abdomen is sore but pain is better.  ?--Maybe home tomorrow if continues to improve.  ?--Encouraged continued ambulation as he is at increased risk for DVTs with IBD ?--He will need to get rescheduled for Entyvio infusion ( was due to start the Q 4 week dosing schedule today).  ? ? ?Subjective  ? ?Abdomen is still sore but has been nice not to have pain over the last 24 hours. Tolerating regular diet. Hasn't had a BM but not totally unexpected after purging for colonoscopy at few days ago and little PO intake since ? ? ? ?Objective  ? ?Endoscopic Studies  ?09/20/21 Colonoscopy with TI intubation ?--Crohn's disease with ileitis. Inflammation characterized by shallow ulcerations was found in the distal ileum. Biopsies ?were taken with a cold forceps for histology. May not have captured with biopsies though I tried. ?-  Diverticulosis in the right colon. ?- The examination was otherwise normal on direct and retroflexion views. This is improved vs  2018 exam ?  ?Imaging  ? ?IMPRESSION: ?1. Evidence of prior bowel resections with chronic fluid distended ?segment of small bowel in the right anterior abdomen in the region ?of anastomosis. No convincing evidence for a bowel obstruction at ?this time. ?2. Focal wall thickening with mucosal enhancement involving distal ?small bowel in the right lower quadrant with small amount of ?adjacent fluid and soft tissue stranding/inflammation, suspicious ?for active enteriti ? ? ?Lab Results: ?Recent Labs  ?  09/20/21 ?2024 09/21/21 ?0840 09/22/21 ?0454  ?WBC 15.7* 13.1* 10.9*  ?HGB 16.5 14.4 14.7  ?HCT 50.7 44.5 44.4  ?PLT 185 132* 142*  ? ?BMET ?Recent Labs  ?  09/20/21 ?2024 09/21/21 ?0840 09/23/21 ?7564  ?NA 139 138 137  ?K 3.3* 3.7 4.1  ?CL 106 107 106  ?CO2 21* 23 22  ?GLUCOSE 103* 85 120*  ?BUN 11 10 14   ?CREATININE 1.35* 0.92 0.93  ?CALCIUM 9.1 8.0* 8.4*  ? ?LFT ?Recent Labs  ?  09/20/21 ?2024  ?PROT 7.2  ?ALBUMIN 4.3  ?AST 38  ?ALT 25  ?ALKPHOS 53  ?BILITOT 1.1  ? ?PT/INR ?Recent Labs  ?  09/20/21 ?2024  ?LABPROT 14.2  ?INR 1.1  ? ? ?  ? ?Scheduled inpatient medications:  ? methylPREDNISolone (SOLU-MEDROL) injection  60 mg Intravenous Q24H  ? pantoprazole  40 mg Oral Daily  ? traZODone  50 mg Oral QHS  ? ?Continuous inpatient  infusions:  ? cefTRIAXone (ROCEPHIN)  IV 2 g (09/22/21 2344)  ? metronidazole 500 mg (09/22/21 2229)  ? ?PRN inpatient medications: acetaminophen **OR** acetaminophen, HYDROcodone-acetaminophen, morphine injection, ondansetron **OR** ondansetron (ZOFRAN) IV ? ?Vital signs in last 24 hours: ?Temp:  [97.5 ?F (36.4 ?C)] 97.5 ?F (36.4 ?C) (03/09 2027) ?Pulse Rate:  [63-64] 63 (03/09 2027) ?Resp:  [18] 18 (03/09 2027) ?BP: (106-140)/(62-71) 140/71 (03/09 2027) ?SpO2:  [97 %-98 %] 97 % (03/09 2027) ?Last BM Date : 09/21/21 ? ?Intake/Output Summary (Last 24 hours) at  09/23/2021 0855 ?Last data filed at 09/23/2021 0500 ?Gross per 24 hour  ?Intake 1868 ml  ?Output --  ?Net 1868 ml  ? ? ? ?Physical Exam:  ?General: Alert male in NAD ?Heart:  Regular rate and rhythm. No lower extremity edema ?Pulmonary: Normal respiratory effort ?Abdomen: Soft, nondistended, nontender. Normal bowel sounds.  ?Neurologic: Alert and oriented ?Psych: Pleasant. Cooperative.  ? ? ? LOS: 2 days  ? ?[name] ,NP 09/23/2021, 8:55 AM ? ? ?_______________________________________________________________________________________________________________________________________ ? ?[facility] MD note: ? ?I personally examined the patient, reviewed the data and agree with the assessment and plan described above.  I provided a substantive portion of the care of this patient (personally provided more than half of the total time dedicated to the treatment of this patient). He's continuing to improve. Eating well, ambulating in halls.  I am changing him to oral abx and steroids.  Cipro and flagyl, both 500 mg BID for 3 more days. Prednisone 60m once daily for now (3-4 week tapering regimen starting after 1 week of 473mdaily). Probably home tomorrow and entyvio to be rescheduled for next week sometime (we will coordinate with out outpatient office for that). ? ?DaOwens LofflerMD ?[facility] ?Pager [phone] ? ? ? ?

## 2021-09-23 NOTE — Progress Notes (Signed)
?  Progress Note ? ? ?Patient: [name] YIR:[id number] DOB: 06-Mar-1951 DOA: 09/20/2021     2 ?DOS: the patient was seen and examined on 09/23/2021 ?  ?Brief hospital course: ?71 year old with past medical history significant for acute prostatitis and epididymitis, Crohn's ileitis, history of prolonged steroid use dilated aortic root, history of GI bleeds and hemorrhoids psoriasis, who had a Colonoscopy the day prior to admission and unfortunately started developing abdominal cramping lightheadedness nausea chills and fever in the ED was found to be tachycardic satting 98% on room air fluid resuscitated started on Flagyl and Rocephin, white count of 15.7 hemoglobin of 16 PT and PTT unremarkable, lactic acid of 2.9, sodium at 3.3 CT scan of the abdomen and pelvis with contrast show evidence of prior bowel resection with chronic fluid distended segment of the bowel, but no convincing evidence of obstruction with some focal thickening of the mucosa of the small bowel in the right lower quadrant with a small amount of fluid and inflammatory changes suspicious for enteritis. ? ?Assessment and Plan: ?* Sepsis ([place]) ?Has remained afebrile tolerating his diet. ?Change antibiotics and steroids to orals per GI recommendations. ? ? ?Crohn's ileitis ([place])  ?No further diarrhea, continue steroids by mouth. ? ? ?GERD ?Continue IV PPI. ? ?Thrombocytopenia ([place]) ?Possibly due to sepsis, now resolved. ? ? ?Hypocalcemia ?Borderline low continue IV fluids recheck in the morning. ? ?Hypophosphatemia ?Borderline.  Replete orally. ? ?Hypokalemia ?Repleted 3.7 we will try to keep above 4, magnesium 1.9. ? ?Asthma ?noted ? ? ? ? ?  ? ?Subjective:  ?Had a regular bowel movement and bloody ? ?Physical Exam: ?Vitals:  ? 09/22/21 0026 09/22/21 0459 09/22/21 1218 09/22/21 2027  ?BP: 121/66 128/77 106/62 140/71  ?Pulse: 66 63 64 63  ?Resp: 16 16 18 18   ?Temp: (!) 97.5 ?F (36.4 ?C) (!) 97.4 ?F (36.3 ?C)  (!) 97.5 ?F (36.4 ?C)  ?TempSrc: Oral Oral   Oral  ?SpO2: 93% 99% 98% 97%  ?Weight:      ?Height:      ? ?General exam: In no acute distress. ?Respiratory system: Good air movement and clear to auscultation. ?Cardiovascular system: S1 & S2 heard, RRR. No JVD. ?Gastrointestinal system: Abdomen is nondistended, soft and nontender.  ?Extremities: No pedal edema. ?Skin: No rashes, lesions or ulcers ?Psychiatry: Judgement and insight appear normal. Mood & affect appropriate. ? ?Data Reviewed: ? ?I have Reviewed nursing notes, Vitals, and Lab results since pt's last encounter. Pertinent lab results vitals which are stable I reviewed his basic metabolic panel and his potassium is improved, his phosphorus is slightly low and along with his calcium ?I have ordered test including basic metabolic panel and a phosphorus in the morning ?I have independently visualized and interpreted imaging CT scan of the abdomen that showed which showed regional enteritis. ?I have ordered imaging studies none. ?I have discussed pt's care plan and test results with patient. ? ? ?Family Communication: None ? ?Disposition: ?Status is: Inpatient ?Remains inpatient appropriate because: Sepsis of undetermined organism ? Planned Discharge Destination: Home ? ? ? ?Time spent: 35 minutes ? ?Author: ?Charlynne Cousins, MD ?09/23/2021 11:29 AM ? ?For on call review [email or web address].  ?

## 2021-09-24 MED ORDER — METRONIDAZOLE 500 MG PO TABS: 500.0000 mg | ORAL_TABLET | Freq: Two times a day (BID) | ORAL | 0 refills | Status: AC

## 2021-09-24 MED ORDER — PREDNISONE 10 MG PO TABS: ORAL_TABLET | ORAL | 0 refills | Status: DC

## 2021-09-24 MED ORDER — CIPROFLOXACIN HCL 500 MG PO TABS: 500.0000 mg | ORAL_TABLET | Freq: Two times a day (BID) | ORAL | 0 refills | Status: AC

## 2021-09-24 MED ORDER — PANTOPRAZOLE SODIUM 40 MG PO TBEC: 40.0000 mg | DELAYED_RELEASE_TABLET | Freq: Every day | ORAL | 1 refills | Status: DC

## 2021-09-24 MED ORDER — PREDNISONE 20 MG PO TABS: 40.0000 mg | ORAL_TABLET | Freq: Every day | ORAL | 0 refills | Status: AC

## 2021-09-24 NOTE — Discharge Summary (Signed)
Physician Discharge Summary  [name] VWU:[id number] DOB: Feb 11, 1951 DOA: 09/20/2021  PCP: [name], MD  Admit date: 09/20/2021 Discharge date: 09/24/2021  Admitted From: Home Disposition:  Home  Recommendations for Outpatient Follow-up:  Follow up with gastroenterology in 4-week Please obtain BMP/CBC in 4 week   Home Health:No Equipment/Devices:None  Discharge Condition:Stable CODE STATUS:Full Diet recommendation: Heart Healthy   Brief/Interim Summary: 71 year old with past medical history significant for acute prostatitis and epididymitis, Crohn's ileitis, history of prolonged steroid use dilated aortic root, history of GI bleeds and hemorrhoids psoriasis, who had a Colonoscopy the day prior to admission and unfortunately started developing abdominal cramping lightheadedness nausea chills and fever in the ED was found to be tachycardic satting 98% on room air fluid resuscitated started on Flagyl and Rocephin, white count of 15.7 hemoglobin of 16 PT and PTT unremarkable, lactic acid of 2.9, sodium at 3.3 CT scan of the abdomen and pelvis with contrast show evidence of prior bowel resection with chronic fluid distended segment of the bowel, but no convincing evidence of obstruction with some focal thickening of the mucosa of the small bowel in the right lower quadrant with a small amount of fluid and inflammatory changes suspicious for enteritis.  Discharge Diagnoses:  Principal Problem:   Sepsis ([place]) Active Problems:   Crohn's ileitis ([place])    GERD   Thrombocytopenia ([place])   Hypocalcemia   Hypophosphatemia   Hypokalemia   Asthma  Sepsis of unclear etiology possibly enteric: Started on IV antibiotics sepsis physiology resolved.  Crohn's colitis: He was started on steroids ciprofloxacin and Flagyl and his diarrhea and abdominal pain resolved. He will continue Cipro for total of 7 days and continue steroid taper as an outpatient.  GERD: Continue PPI  daily.  Thrombocytopenia: Possibly due to sepsis now resolved.  Hypocalcemia: Borderline corrected for his albumin is normal.  Hypophosphatemia: Borderline was repleted orally.  Hypokalemia: He was repleted orally, now 4.1.   Discharge Instructions  Discharge Instructions     Diet - low sodium heart healthy   Complete by: As directed    Increase activity slowly   Complete by: As directed       Allergies as of 09/24/2021       Reactions   Mercaptopurine Other (See Comments)   Pancreatitis   Shellfish Allergy Anaphylaxis   Throat started to close   Humira [adalimumab]    Developed antibodies   Wound Dressing Adhesive Rash        Medication List     TAKE these medications    albuterol 108 (90 Base) MCG/ACT inhaler Commonly known as: VENTOLIN HFA INHALE 2 PUFFS INTO THE LUNGS EVERY 4 (FOUR) HOURS AS NEEDED FOR WHEEZING OR SHORTNESS OF BREATH.   ciprofloxacin 500 MG tablet Commonly known as: CIPRO Take 1 tablet (500 mg total) by mouth 2 (two) times daily for 2 days.   diphenoxylate-atropine 2.5-0.025 MG tablet Commonly known as: LOMOTIL 1-2 tablets 4 times a day as neded What changed: additional instructions   Entyvio 300 MG injection Generic drug: vedolizumab INFUSE 300 MG IV every 8 weeks What changed:  how much to take how to take this when to take this additional instructions   EPINEPHrine 0.3 mg/0.3 mL Soaj injection Commonly known as: EpiPen 2-Pak Inject 0.3 mLs (0.3 mg total) into the muscle as needed for anaphylaxis.   esomeprazole 20 MG capsule Commonly known as: NEXIUM Take 20 mg by mouth daily as needed (acid reflux).   fluticasone 0.05 % cream  Commonly known as: CUTIVATE Apply 1 application topically 2 (two) times daily as needed for irritation.   halobetasol 0.05 % ointment Commonly known as: ULTRAVATE Apply topically 2 (two) times daily. What changed:  how much to take when to take this reasons to take this   hyoscyamine  0.125 MG SL tablet Commonly known as: LEVSIN SL PLACE 1 TABLET UNDER THE TONGUE EVERY 4 HOURS AS NEEDED FOR CRAMPING (URGENT DEFECATION) What changed:  how much to take how to take this when to take this reasons to take this additional instructions   ketoconazole 2 % cream Commonly known as: NIZORAL APPLY TWICE A DAY AS NEEDED FOR FUNGAL INFECTION What changed:  how much to take how to take this when to take this reasons to take this   losartan 25 MG tablet Commonly known as: COZAAR TAKE 1 TABLET DAILY   metroNIDAZOLE 500 MG tablet Commonly known as: FLAGYL Take 1 tablet (500 mg total) by mouth every 12 (twelve) hours for 2 days.   multivitamin with minerals tablet Take 1 tablet by mouth daily.   ondansetron 4 MG disintegrating tablet Commonly known as: ZOFRAN-ODT 12m ODT q4 hours prn nausea/vomit   pantoprazole 40 MG tablet Commonly known as: PROTONIX Take 1 tablet (40 mg total) by mouth daily. Start taking on: September 25, 2021   pramoxine-hydrocortisone 1-1 % rectal cream Commonly known as: Analpram-HC Place 1 application rectally 2 (two) times daily. Use twice daily before SITZ baths   predniSONE 20 MG tablet Commonly known as: DELTASONE Take 2 tablets (40 mg total) by mouth daily with breakfast for 7 days. Start taking on: September 25, 2021   predniSONE 10 MG tablet Commonly known as: DELTASONE Takes 4 tablets for 3 days, then 3 tablets for 5 days, then 2 tabs for 5 days, then 1 tab for 5 days, and then stop. Start taking on: September 30, 2021   tadalafil 5 MG tablet Commonly known as: Cialis Take 1 tablet (5 mg total) by mouth daily.   traZODone 50 MG tablet Commonly known as: DESYREL Take 1 tablet (50 mg total) by mouth at bedtime.   Turmeric 500 MG Caps Take 500 mg by mouth daily.        Allergies  Allergen Reactions   Mercaptopurine Other (See Comments)    Pancreatitis    Shellfish Allergy Anaphylaxis    Throat started to close   Humira  [Adalimumab]     Developed antibodies   Wound Dressing Adhesive Rash    Consultations: Gastroenterology   Procedures/Studies: CT Abdomen Pelvis W Contrast  Result Date: 09/20/2021 CLINICAL DATA:  Diffuse abdominal pain and distension, fever after colonoscopy EXAM: CT ABDOMEN AND PELVIS WITH CONTRAST TECHNIQUE: Multidetector CT imaging of the abdomen and pelvis was performed using the standard protocol following bolus administration of intravenous contrast. RADIATION DOSE REDUCTION: This exam was performed according to the departmental dose-optimization program which includes automated exposure control, adjustment of the mA and/or kV according to patient size and/or use of iterative reconstruction technique. CONTRAST:  1043mOMNIPAQUE IOHEXOL 300 MG/ML  SOLN COMPARISON:  CT, 08/30/2021, 01/06/2021, 09/06/2018 FINDINGS: Lower chest: Lung bases demonstrate no acute consolidation or effusion. Stable right lower lobe subpleural nodularity. Partially visualized cardiac pacing leads. Hepatobiliary: No focal liver abnormality is seen. No gallstones, gallbladder wall thickening, or biliary dilatation. Pancreas: Unremarkable. No pancreatic ductal dilatation or surrounding inflammatory changes. Spleen: Normal in size without focal abnormality. Adrenals/Urinary Tract: Adrenal glands are unremarkable. Kidneys are normal, without renal calculi, focal lesion,  or hydronephrosis. Bladder is decompressed Stomach/Bowel: Stomach nonenlarged. Status post bowel resection. Chronic fluid distended segment of bowel near the right anterior abdominal anastomosis. No convincing evidence for bowel obstruction. Mild wall thickening and mucosal enhancement of right lower quadrant distal small bowel, series 2, image 53. Mild fluid and surrounding inflammatory process. Negative appendix. Vascular/Lymphatic: Nonaneurysmal aorta. Mild atherosclerosis. No suspicious lymph nodes Reproductive: Slightly enlarged prostate Other: Negative for  pelvic effusion or free air. Small fat containing inguinal hernias. Musculoskeletal: No acute osseous abnormality. IMPRESSION: 1. Evidence of prior bowel resections with chronic fluid distended segment of small bowel in the right anterior abdomen in the region of anastomosis. No convincing evidence for a bowel obstruction at this time. 2. Focal wall thickening with mucosal enhancement involving distal small bowel in the right lower quadrant with small amount of adjacent fluid and soft tissue stranding/inflammation, suspicious for active enteritis. Electronically Signed   By: Donavan Foil M.D.   On: 09/20/2021 22:31   CT ABDOMEN PELVIS W CONTRAST  Result Date: 08/30/2021 CLINICAL DATA:  Abdominal pain EXAM: CT ABDOMEN AND PELVIS WITH CONTRAST TECHNIQUE: Multidetector CT imaging of the abdomen and pelvis was performed using the standard protocol following bolus administration of intravenous contrast. RADIATION DOSE REDUCTION: This exam was performed according to the departmental dose-optimization program which includes automated exposure control, adjustment of the mA and/or kV according to patient size and/or use of iterative reconstruction technique. CONTRAST:  169m OMNIPAQUE IOHEXOL 300 MG/ML  SOLN COMPARISON:  01/06/2021 FINDINGS: Lower chest: There is pleural thickening along with small linear density in the posterolateral aspect of right lower lung fields with no significant interval change. Pacemaker leads are noted in the heart. Hepatobiliary: There is fatty infiltration in the liver. Gallbladder is unremarkable. Pancreas: No focal abnormality is seen. Spleen: Unremarkable. Adrenals/Urinary Tract: Adrenals are not enlarged. There is no hydronephrosis. There are no renal or ureteral stones. Urinary bladder is not distended. Stomach/Bowel: Small hiatal hernia is seen. Stomach is not distended. Proximal small bowel loops are not dilated. There is dilation of distal small bowel loops especially in the right  lower abdomen measuring up to 9.1 cm in diameter with air-fluid level. There is interval worsening of this finding. Surgical staples are seen in the right side of the dilated small bowel loop. There are few other dilated small bowel loops in the left lower quadrant measuring up to 4.4 cm in diameter. Appendix is not dilated. Scattered diverticula are seen in the colon. There is evidence of left hemicolectomy. Surgical staples are seen in the sigmoid. There is no new significant wall thickening in the colon. There is no pericolic stranding. Vascular/Lymphatic: Unremarkable. Reproductive: Prostate is enlarged with coarse calcifications. Other: There is no ascites or pneumoperitoneum. Bilateral inguinal hernias containing fat are seen. Musculoskeletal: Unremarkable. IMPRESSION: There is previous small bowel anastomosis in the right lower quadrant of abdomen. There is dilation of small bowel loop immediately adjacent to surgical staples in the small bowel in the right lower quadrant measuring up to 9.1 cm in diameter with air-fluid level. There are few other less dilated small bowel loops in the left lower quadrant of abdomen. This may suggest chronic post surgical ileus or stricture at ileo ileal anastomosis. Proximal small bowel loops are not dilated. There is no pneumoperitoneum. There is no ascites. There is previous left hemicolectomy. Fatty liver. Small hiatal hernia. Enlarged prostate. Small bilateral inguinal hernias containing fat. Other findings as described in the body of the report. Electronically Signed   By: PRoyston Cowper  Rathinasamy M.D.   On: 08/30/2021 17:00   DG Abd Acute W/Chest  Result Date: 09/20/2021 CLINICAL DATA:  Status post colonoscopy today with abdominal pain, fever and chills. EXAM: DG ABDOMEN ACUTE WITH 1 VIEW CHEST COMPARISON:  January 04, 2021 FINDINGS: There is no evidence of dilated bowel loops or free intraperitoneal air. No radiopaque calculi are seen. Ill-defined surgical sutures are seen  overlying the mid right abdomen. Radiopaque surgical clips are seen within pelvis on the left. Heart size and mediastinal contours are within normal limits. There is a dual lead AICD. Both lungs are clear. IMPRESSION: Negative abdominal radiographs. No acute cardiopulmonary disease. Electronically Signed   By: Virgina Norfolk M.D.   On: 09/20/2021 20:51   CUP PACEART REMOTE DEVICE CHECK  Result Date: 09/02/2021 Scheduled remote reviewed. Normal device function.  1 AF episodes 30sec in duration, no sustained events, no OAC Next remote 91 days. LA  (Echo, Carotid, EGD, Colonoscopy, ERCP)    Subjective: No complaints  Discharge Exam: Vitals:   09/23/21 2054 09/24/21 0555  BP: (!) 144/75 (!) 151/81  Pulse: (!) 59 62  Resp: 18 18  Temp: (!) 97.5 F (36.4 C) 97.7 F (36.5 C)  SpO2: 97% 98%   Vitals:   09/22/21 2027 09/23/21 1328 09/23/21 2054 09/24/21 0555  BP: 140/71 (!) 145/80 (!) 144/75 (!) 151/81  Pulse: 63 63 (!) 59 62  Resp: 18 18 18 18   Temp: (!) 97.5 F (36.4 C) (!) 97.5 F (36.4 C) (!) 97.5 F (36.4 C) 97.7 F (36.5 C)  TempSrc: Oral Oral Oral Oral  SpO2: 97% 95% 97% 98%  Weight:      Height:        General: Pt is alert, awake, not in acute distress Cardiovascular: RRR, S1/S2 +, no rubs, no gallops Respiratory: CTA bilaterally, no wheezing, no rhonchi Abdominal: Soft, NT, ND, bowel sounds + Extremities: no edema, no cyanosis    The results of significant diagnostics from this hospitalization (including imaging, microbiology, ancillary and laboratory) are listed below for reference.     Microbiology: Recent Results (from the past 240 hour(s))  Resp Panel by RT-PCR (Flu A&B, Covid) Nasopharyngeal Swab     Status: None   Collection Time: 09/20/21  8:28 PM   Specimen: Nasopharyngeal Swab; Nasopharyngeal(NP) swabs in vial transport medium  Result Value Ref Range Status   SARS Coronavirus 2 by RT PCR NEGATIVE NEGATIVE Final    Comment: (NOTE) SARS-CoV-2 target  nucleic acids are NOT DETECTED.  The SARS-CoV-2 RNA is generally detectable in upper respiratory specimens during the acute phase of infection. The lowest concentration of SARS-CoV-2 viral copies this assay can detect is 138 copies/mL. A negative result does not preclude SARS-Cov-2 infection and should not be used as the sole basis for treatment or other patient management decisions. A negative result may occur with  improper specimen collection/handling, submission of specimen other than nasopharyngeal swab, presence of viral mutation(s) within the areas targeted by this assay, and inadequate number of viral copies(<138 copies/mL). A negative result must be combined with clinical observations, patient history, and epidemiological information. The expected result is Negative.  Fact Sheet for Patients:  EntrepreneurPulse.com.au  Fact Sheet for Healthcare Providers:  IncredibleEmployment.be  This test is no t yet approved or cleared by the [place] FDA and  has been authorized for detection and/or diagnosis of SARS-CoV-2 by FDA under an Emergency Use Authorization (EUA). This EUA will remain  in effect (meaning this test can be used) for the  duration of the COVID-19 declaration under Section 564(b)(1) of the Act, 21 U.S.C.section 360bbb-3(b)(1), unless the authorization is terminated  or revoked sooner.       Influenza A by PCR NEGATIVE NEGATIVE Final   Influenza B by PCR NEGATIVE NEGATIVE Final    Comment: (NOTE) The Xpert Xpress SARS-CoV-2/FLU/RSV plus assay is intended as an aid in the diagnosis of influenza from Nasopharyngeal swab specimens and should not be used as a sole basis for treatment. Nasal washings and aspirates are unacceptable for Xpert Xpress SARS-CoV-2/FLU/RSV testing.  Fact Sheet for Patients: EntrepreneurPulse.com.au  Fact Sheet for Healthcare  Providers: IncredibleEmployment.be  This test is not yet approved or cleared by the [place] FDA and has been authorized for detection and/or diagnosis of SARS-CoV-2 by FDA under an Emergency Use Authorization (EUA). This EUA will remain in effect (meaning this test can be used) for the duration of the COVID-19 declaration under Section 564(b)(1) of the Act, 21 U.S.C. section 360bbb-3(b)(1), unless the authorization is terminated or revoked.  Performed at [facility], [place] [address], [place], [place] [address]   Blood Culture (routine x 2)     Status: None (Preliminary result)   Collection Time: 09/20/21  9:10 PM   Specimen: BLOOD  Result Value Ref Range Status   Specimen Description   Final    BLOOD LEFT ANTECUBITAL Performed at [facility] [address] [place], [place] [address]    Special Requests   Final    BOTTLES DRAWN AEROBIC AND ANAEROBIC Blood Culture adequate volume Performed at [facility] [address] [place], [place] [address]    Culture   Final    NO GROWTH 4 DAYS Performed at [facility], [place] [address], [place], [place] [address]    Report Status PENDING  Incomplete  Blood Culture (routine x 2)     Status: None (Preliminary result)   Collection Time: 09/20/21  9:10 PM   Specimen: BLOOD  Result Value Ref Range Status   Specimen Description   Final    BLOOD RIGHT ANTECUBITAL Performed at [facility] [address] [place], [place] [address]    Special Requests   Final    BOTTLES DRAWN AEROBIC AND ANAEROBIC Blood Culture adequate volume Performed at [facility] [address] [place], [place] [address]    Culture   Final    NO GROWTH 4 DAYS Performed at [facility], [place] [address] [place], [place] [address]    Report Status PENDING  Incomplete  Urine Culture     Status: None   Collection Time: 09/20/21  9:25  PM   Specimen: In/Out Cath Urine  Result Value Ref Range Status   Specimen Description   Final    IN/OUT CATH URINE Performed at [facility] [address], [place], [place] [address]    Special Requests   Final    NONE Performed at [facility], [place] [address] [place], [place] [address]    Culture   Final    NO GROWTH Performed at [facility], [place] [address], [place], [place] [address]    Report Status 09/22/2021 FINAL  Final     Labs: BNP (last 3 results) No results for input(s): BNP in the last 8760 hours. Basic Metabolic Panel: Recent Labs  Lab 09/20/21 2024 09/21/21 0840 09/23/21 0447  NA 139 138 137  K 3.3* 3.7 4.1  CL  106 107 106  CO2 21* 23 22  GLUCOSE 103* 85 120*  BUN 11 10 14   CREATININE 1.35* 0.92 0.93  CALCIUM 9.1 8.0* 8.4*  MG  --  1.9  --   PHOS  --  2.3* 3.0   Liver Function Tests: Recent Labs  Lab 09/20/21 2024  AST 38  ALT 25  ALKPHOS 53  BILITOT 1.1  PROT 7.2  ALBUMIN 4.3   No results for input(s): LIPASE, AMYLASE in the last 168 hours. No results for input(s): AMMONIA in the last 168 hours. CBC: Recent Labs  Lab 09/20/21 2024 09/21/21 0840 09/22/21 0454  WBC 15.7* 13.1* 10.9*  NEUTROABS 14.6*  --   --   HGB 16.5 14.4 14.7  HCT 50.7 44.5 44.4  MCV 88.6 91.2 89.3  PLT 185 132* 142*   Cardiac Enzymes: No results for input(s): CKTOTAL, CKMB, CKMBINDEX, TROPONINI in the last 168 hours. BNP: Invalid input(s): POCBNP CBG: No results for input(s): GLUCAP in the last 168 hours. D-Dimer No results for input(s): DDIMER in the last 72 hours. Hgb A1c No results for input(s): HGBA1C in the last 72 hours. Lipid Profile No results for input(s): CHOL, HDL, LDLCALC, TRIG, CHOLHDL, LDLDIRECT in the last 72 hours. Thyroid function studies No results for input(s): TSH, T4TOTAL, T3FREE, THYROIDAB in the last 72 hours.  Invalid input(s): FREET3 Anemia work up No results for input(s):  VITAMINB12, FOLATE, FERRITIN, TIBC, IRON, RETICCTPCT in the last 72 hours. Urinalysis    Component Value Date/Time   COLORURINE AMBER (A) 09/20/2021 2125   APPEARANCEUR HAZY (A) 09/20/2021 2125   LABSPEC 1.026 09/20/2021 2125   PHURINE 5.0 09/20/2021 2125   GLUCOSEU NEGATIVE 09/20/2021 2125   HGBUR NEGATIVE 09/20/2021 2125   HGBUR negative 02/15/2010 1105   BILIRUBINUR NEGATIVE 09/20/2021 2125   BILIRUBINUR Positive 01/08/2018 1551   KETONESUR 5 (A) 09/20/2021 2125   PROTEINUR 100 (A) 09/20/2021 2125   UROBILINOGEN 0.2 01/08/2018 1551   UROBILINOGEN 0.2 12/19/2011 1101   NITRITE NEGATIVE 09/20/2021 2125   LEUKOCYTESUR TRACE (A) 09/20/2021 2125   Sepsis Labs Invalid input(s): PROCALCITONIN,  WBC,  LACTICIDVEN Microbiology Recent Results (from the past 240 hour(s))  Resp Panel by RT-PCR (Flu A&B, Covid) Nasopharyngeal Swab     Status: None   Collection Time: 09/20/21  8:28 PM   Specimen: Nasopharyngeal Swab; Nasopharyngeal(NP) swabs in vial transport medium  Result Value Ref Range Status   SARS Coronavirus 2 by RT PCR NEGATIVE NEGATIVE Final    Comment: (NOTE) SARS-CoV-2 target nucleic acids are NOT DETECTED.  The SARS-CoV-2 RNA is generally detectable in upper respiratory specimens during the acute phase of infection. The lowest concentration of SARS-CoV-2 viral copies this assay can detect is 138 copies/mL. A negative result does not preclude SARS-Cov-2 infection and should not be used as the sole basis for treatment or other patient management decisions. A negative result may occur with  improper specimen collection/handling, submission of specimen other than nasopharyngeal swab, presence of viral mutation(s) within the areas targeted by this assay, and inadequate number of viral copies(<138 copies/mL). A negative result must be combined with clinical observations, patient history, and epidemiological information. The expected result is Negative.  Fact Sheet for  Patients:  EntrepreneurPulse.com.au  Fact Sheet for Healthcare Providers:  IncredibleEmployment.be  This test is no t yet approved or cleared by the [place] FDA and  has been authorized for detection and/or diagnosis of SARS-CoV-2 by FDA under an Emergency Use Authorization (EUA). This EUA will remain  in effect (meaning this test can be used) for the duration of the COVID-19 declaration under Section 564(b)(1) of the Act, 21 U.S.C.section 360bbb-3(b)(1), unless the authorization is terminated  or revoked sooner.       Influenza A by PCR NEGATIVE NEGATIVE Final   Influenza B by PCR NEGATIVE NEGATIVE Final    Comment: (NOTE) The Xpert Xpress SARS-CoV-2/FLU/RSV plus assay is intended as an aid in the diagnosis of influenza from Nasopharyngeal swab specimens and should not be used as a sole basis for treatment. Nasal washings and aspirates are unacceptable for Xpert Xpress SARS-CoV-2/FLU/RSV testing.  Fact Sheet for Patients: EntrepreneurPulse.com.au  Fact Sheet for Healthcare Providers: IncredibleEmployment.be  This test is not yet approved or cleared by the [place] FDA and has been authorized for detection and/or diagnosis of SARS-CoV-2 by FDA under an Emergency Use Authorization (EUA). This EUA will remain in effect (meaning this test can be used) for the duration of the COVID-19 declaration under Section 564(b)(1) of the Act, 21 U.S.C. section 360bbb-3(b)(1), unless the authorization is terminated or revoked.  Performed at [facility], [place] [address], [place], [place] [address]   Blood Culture (routine x 2)     Status: None (Preliminary result)   Collection Time: 09/20/21  9:10 PM   Specimen: BLOOD  Result Value Ref Range Status   Specimen Description   Final    BLOOD LEFT ANTECUBITAL Performed at [facility] [address], [place], [place]  [address]    Special Requests   Final    BOTTLES DRAWN AEROBIC AND ANAEROBIC Blood Culture adequate volume Performed at [facility] [address] [place], [place] [address]    Culture   Final    NO GROWTH 4 DAYS Performed at [facility], [place] [address] [place], [place] [address]    Report Status PENDING  Incomplete  Blood Culture (routine x 2)     Status: None (Preliminary result)   Collection Time: 09/20/21  9:10 PM   Specimen: BLOOD  Result Value Ref Range Status   Specimen Description   Final    BLOOD RIGHT ANTECUBITAL Performed at [facility] [address] [place], [place] [address]    Special Requests   Final    BOTTLES DRAWN AEROBIC AND ANAEROBIC Blood Culture adequate volume Performed at [facility] [address], [place], [place] [address]    Culture   Final    NO GROWTH 4 DAYS Performed at [facility], [place] [address], [place], [place] [address]    Report Status PENDING  Incomplete  Urine Culture     Status: None   Collection Time: 09/20/21  9:25 PM   Specimen: In/Out Cath Urine  Result Value Ref Range Status   Specimen Description   Final    IN/OUT CATH URINE Performed at [facility] [address], [place], [place] [address]    Special Requests   Final    NONE Performed at [facility], [place] [address], [place], [place] [address]    Culture   Final    NO GROWTH Performed at [facility], [place] [address], [place], [place] [address]    Report Status 09/22/2021 FINAL  Final       SIGNED:   Charlynne Cousins, MD  [facility] 09/24/2021, 10:29 AM Pager   If 7PM-7AM, please contact night-coverage [email or web address] Password [email or web address]

## 2021-09-24 NOTE — Progress Notes (Signed)
[facility] Progress Note ? ? ? ?Since last GI note: ?He slept well overnight. Eating well, ambulating in halls.  No diarrhea. Abd pains back to his baseline "1/10" ? ?Objective: ?Vital signs in last 24 hours: ?Temp:  [97.5 ?F (36.4 ?C)-97.7 ?F (36.5 ?C)] 97.7 ?F (36.5 ?C) (03/11 0555) ?Pulse Rate:  [59-63] 62 (03/11 0555) ?Resp:  [18] 18 (03/11 0555) ?BP: (144-151)/(75-81) 151/81 (03/11 0555) ?SpO2:  [95 %-98 %] 98 % (03/11 0555) ?Last BM Date : 09/21/21 ?General: alert and oriented times 3 ?Heart: regular rate and rythm ?Abdomen: soft, mildly tender, non-distended, normal bowel sounds ? ? ?Lab Results: ?Recent Labs  ?  09/21/21 ?0840 09/22/21 ?0454  ?WBC 13.1* 10.9*  ?HGB 14.4 14.7  ?PLT 132* 142*  ?MCV 91.2 89.3  ? ?Recent Labs  ?  09/21/21 ?0840 09/23/21 ?1638  ?NA 138 137  ?K 3.7 4.1  ?CL 107 106  ?CO2 23 22  ?GLUCOSE 85 120*  ?BUN 10 14  ?CREATININE 0.92 0.93  ?CALCIUM 8.0* 8.4*  ? ? ?Medications: ?Scheduled Meds: ? ciprofloxacin  500 mg Oral BID  ? metroNIDAZOLE  500 mg Oral Q12H  ? pantoprazole  40 mg Oral Daily  ? predniSONE  40 mg Oral Q breakfast  ? traZODone  50 mg Oral QHS  ? ?Continuous Infusions: ?PRN Meds:.acetaminophen **OR** acetaminophen, HYDROcodone-acetaminophen, ondansetron **OR** ondansetron (ZOFRAN) IV ? ?Assessment/Plan: ?71 y.o. male with crohn's ileitis, abd pain and fever after colonoscopy 4 days ago ? ?He is OK to d/c today.   ? ?He should complete 2 more days of cipro/flagyl both 546m BID (for total abx of 7 days).  He should stay on prednisone 423monce daily for a week and then taper to zero over 3 weeks.  He plans to call to reschedule his first 4 week entyvio to be done early this coming week.  Our office will assist if needed. ? ?Signing off.  Please call or page with any further questions or concerns. ? ?DaMilus BanisterMD  09/24/2021, 7:24 AM ?[facility] ?Pager [phone] ? ? ? ?

## 2021-09-25 LAB — CULTURE, BLOOD (ROUTINE X 2)
Culture: NO GROWTH
Culture: NO GROWTH
Special Requests: ADEQUATE
Special Requests: ADEQUATE

## 2021-09-26 ENCOUNTER — Telehealth: Payer: Self-pay | Admitting: Internal Medicine

## 2021-09-26 NOTE — Telephone Encounter (Signed)
Patient called reporting he was discharged from the hospital.  He said [name] wanted him to be seen.  The first appointment I had available was 3/29--didn't know if he needed something sooner.  He also wanted to let you & [name] know that he will have his Entyvio infusion tomorrow. Please call patient and advise.  Thank you. ?

## 2021-09-26 NOTE — Telephone Encounter (Signed)
Pt scheduled for 10/12/2021: Would you like to see him sooner:  ?Please advise  ?

## 2021-09-26 NOTE — Telephone Encounter (Signed)
Update received and that appointment date is fine ?

## 2021-09-26 NOTE — Telephone Encounter (Signed)
Pt was made aware that [name] has received the  update and the appointment for 10/12/2021 is fine per [name]: ?Pt verbalized understanding with all questions answered.  ? ?

## 2021-09-27 ENCOUNTER — Ambulatory Visit (INDEPENDENT_AMBULATORY_CARE_PROVIDER_SITE_OTHER): Payer: Medicare Other

## 2021-09-27 ENCOUNTER — Other Ambulatory Visit: Payer: Self-pay

## 2021-09-27 VITALS — BP 128/82 | HR 60 | Temp 97.9°F | Resp 18 | Ht 70.0 in | Wt 201.2 lb

## 2021-09-27 DIAGNOSIS — K50019 Crohn's disease of small intestine with unspecified complications: Secondary | ICD-10-CM | POA: Diagnosis not present

## 2021-09-27 MED ORDER — VEDOLIZUMAB 300 MG IV SOLR
300.0000 mg | Freq: Once | INTRAVENOUS | Status: AC
  Administered 2021-09-27: 300 mg via INTRAVENOUS
  Filled 2021-09-27: qty 5

## 2021-09-27 NOTE — Progress Notes (Signed)
Diagnosis: Crohn's Disease ? ?Provider:  Marshell Garfinkel, MD ? ?Procedure: Infusion ? ?IV Type: Peripheral, IV Location: L Forearm ? ?Entyvio (Vedolizumab), Dose: 300 mg ? ?Infusion Start Time: 81661 ? ?Infusion Stop Time: 1119 ? ?Post Infusion IV Care: Observation period completed and Peripheral IV Discontinued ? ?Discharge: Condition: Good, Destination: Home . AVS provided to patient.  ? ?Performed by:  Cleophus Molt, RN  ?  ?

## 2021-09-29 ENCOUNTER — Ambulatory Visit: Payer: Medicare Other

## 2021-10-04 ENCOUNTER — Encounter: Payer: Self-pay | Admitting: Family Medicine

## 2021-10-04 ENCOUNTER — Ambulatory Visit (INDEPENDENT_AMBULATORY_CARE_PROVIDER_SITE_OTHER): Payer: Medicare Other | Admitting: Family Medicine

## 2021-10-04 VITALS — BP 110/70 | HR 63 | Temp 97.7°F | Wt 202.5 lb

## 2021-10-04 DIAGNOSIS — K529 Noninfective gastroenteritis and colitis, unspecified: Secondary | ICD-10-CM | POA: Diagnosis not present

## 2021-10-04 DIAGNOSIS — R300 Dysuria: Secondary | ICD-10-CM

## 2021-10-04 DIAGNOSIS — A419 Sepsis, unspecified organism: Secondary | ICD-10-CM

## 2021-10-04 DIAGNOSIS — N390 Urinary tract infection, site not specified: Secondary | ICD-10-CM | POA: Diagnosis not present

## 2021-10-04 LAB — POC URINALSYSI DIPSTICK (AUTOMATED)
Bilirubin, UA: NEGATIVE
Blood, UA: NEGATIVE
Glucose, UA: NEGATIVE
Ketones, UA: NEGATIVE
Leukocytes, UA: NEGATIVE
Nitrite, UA: NEGATIVE
Protein, UA: POSITIVE — AB
Spec Grav, UA: 1.02 (ref 1.010–1.025)
Urobilinogen, UA: 1 E.U./dL
pH, UA: 6 (ref 5.0–8.0)

## 2021-10-04 MED ORDER — SULFAMETHOXAZOLE-TRIMETHOPRIM 800-160 MG PO TABS: 1.0000 | ORAL_TABLET | Freq: Two times a day (BID) | ORAL | 0 refills | Status: DC

## 2021-10-04 NOTE — Progress Notes (Signed)
? ?  Subjective:  ? ? Patient ID: [name], male    DOB: 12/04/1950, 71 y.o.   MRN: [id number] ? ?HPI ?Here to follow up on a hospital stay from 09-20-21 to 09-24-21 and also for what he thinks is an acute UTI. During the hospital stay he had an enteritis which then led to sepsis. On admission he had a fever to 102 degrees and his WBC was up to 15.7. A CT scan revealed thickening of the bowel walls indicating an enteritis.  ?Blood and urine cultures showed no growth. He was treated with Cipro and Metronidazole and he recovered quickly. His WBC went down to 10.9 by DC. He was sent home on a few additional days of Cipro and a Prednisone taper. He is presently at 30 mg a day of the Prednisone. Then 3 days ago he developed urinary urgency and burning. No fever or back pain or nausea. He is drinking plenty of water. He was not catheterized during the hospital stay.  ? ?Review of Systems  ?Constitutional: Negative.   ?Respiratory: Negative.    ?Cardiovascular: Negative.   ?Gastrointestinal: Negative.   ?Genitourinary:  Positive for dysuria, frequency and urgency. Negative for flank pain and hematuria.  ? ?   ?Objective:  ? Physical Exam ?Constitutional:   ?   Appearance: Normal appearance. He is not ill-appearing.  ?Cardiovascular:  ?   Rate and Rhythm: Normal rate and regular rhythm.  ?   Pulses: Normal pulses.  ?   Heart sounds: Normal heart sounds.  ?Pulmonary:  ?   Effort: Pulmonary effort is normal.  ?   Breath sounds: Normal breath sounds.  ?Abdominal:  ?   General: Abdomen is flat. Bowel sounds are normal. There is no distension.  ?   Palpations: Abdomen is soft.  ?   Tenderness: There is no right CVA tenderness, left CVA tenderness, guarding or rebound.  ?   Comments: He is mildly tender in all 4 quadrants   ?Neurological:  ?   Mental Status: He is alert.  ? ? ? ? ? ?   ?Assessment & Plan:  ?He is recovering from an enteritis/sepsis and now he has a UTI.  We will treat him with 10 days of Bactrim DS. Culture the  sample. We spent a total of (35   ) minutes reviewing records and discussing these issues.  ?Alysia Penna, MD ? ? ?

## 2021-10-05 LAB — URINE CULTURE
MICRO NUMBER:: 13158638
Result:: NO GROWTH
SPECIMEN QUALITY:: ADEQUATE

## 2021-10-10 ENCOUNTER — Encounter: Payer: Self-pay | Admitting: Family Medicine

## 2021-10-12 ENCOUNTER — Encounter: Payer: Self-pay | Admitting: Internal Medicine

## 2021-10-12 ENCOUNTER — Other Ambulatory Visit: Payer: Self-pay

## 2021-10-12 ENCOUNTER — Ambulatory Visit (INDEPENDENT_AMBULATORY_CARE_PROVIDER_SITE_OTHER)
Admission: RE | Admit: 2021-10-12 | Discharge: 2021-10-12 | Disposition: A | Discharge status: Home / Self Care | Payer: Medicare Other | Source: Ambulatory Visit | Attending: Internal Medicine | Admitting: Internal Medicine

## 2021-10-12 ENCOUNTER — Other Ambulatory Visit (INDEPENDENT_AMBULATORY_CARE_PROVIDER_SITE_OTHER): Payer: Medicare Other

## 2021-10-12 ENCOUNTER — Ambulatory Visit (INDEPENDENT_AMBULATORY_CARE_PROVIDER_SITE_OTHER): Payer: Medicare Other | Admitting: Internal Medicine

## 2021-10-12 VITALS — BP 118/78 | HR 71 | Wt 200.0 lb

## 2021-10-12 DIAGNOSIS — R10813 Right lower quadrant abdominal tenderness: Secondary | ICD-10-CM | POA: Diagnosis not present

## 2021-10-12 DIAGNOSIS — Z7960 Long term (current) use of unspecified immunomodulators and immunosuppressants: Secondary | ICD-10-CM

## 2021-10-12 DIAGNOSIS — Z9049 Acquired absence of other specified parts of digestive tract: Secondary | ICD-10-CM | POA: Diagnosis not present

## 2021-10-12 DIAGNOSIS — K50018 Crohn's disease of small intestine with other complication: Secondary | ICD-10-CM | POA: Diagnosis not present

## 2021-10-12 DIAGNOSIS — R1031 Right lower quadrant pain: Secondary | ICD-10-CM

## 2021-10-12 DIAGNOSIS — K9189 Other postprocedural complications and disorders of digestive system: Secondary | ICD-10-CM

## 2021-10-12 LAB — CBC WITH DIFFERENTIAL/PLATELET
Basophils Absolute: 0 10*3/uL (ref 0.0–0.1)
Basophils Relative: 0.4 % (ref 0.0–3.0)
Eosinophils Absolute: 0.1 10*3/uL (ref 0.0–0.7)
Eosinophils Relative: 0.9 % (ref 0.0–5.0)
HCT: 47.5 % (ref 39.0–52.0)
Hemoglobin: 15.4 g/dL (ref 13.0–17.0)
Lymphocytes Relative: 24.4 % (ref 12.0–46.0)
Lymphs Abs: 3.1 10*3/uL (ref 0.7–4.0)
MCHC: 32.4 g/dL (ref 30.0–36.0)
MCV: 89.7 fl (ref 78.0–100.0)
Monocytes Absolute: 1 10*3/uL (ref 0.1–1.0)
Monocytes Relative: 7.7 % (ref 3.0–12.0)
Neutro Abs: 8.4 10*3/uL — ABNORMAL HIGH (ref 1.4–7.7)
Neutrophils Relative %: 66.6 % (ref 43.0–77.0)
Platelets: 188 10*3/uL (ref 150.0–400.0)
RBC: 5.3 Mil/uL (ref 4.22–5.81)
RDW: 14.8 % (ref 11.5–15.5)
WBC: 12.6 10*3/uL — ABNORMAL HIGH (ref 4.0–10.5)

## 2021-10-12 LAB — COMPREHENSIVE METABOLIC PANEL
ALT: 16 U/L (ref 0–53)
AST: 15 U/L (ref 0–37)
Albumin: 4.1 g/dL (ref 3.5–5.2)
Alkaline Phosphatase: 46 U/L (ref 39–117)
BUN: 17 mg/dL (ref 6–23)
CO2: 27 mEq/L (ref 19–32)
Calcium: 9.3 mg/dL (ref 8.4–10.5)
Chloride: 103 mEq/L (ref 96–112)
Creatinine, Ser: 1.21 mg/dL (ref 0.40–1.50)
GFR: 61.42 mL/min (ref >60.00)
Glucose, Bld: 81 mg/dL (ref 70–99)
Potassium: 4 mEq/L (ref 3.5–5.1)
Sodium: 138 mEq/L (ref 135–145)
Total Bilirubin: 0.7 mg/dL (ref 0.2–1.2)
Total Protein: 6.5 g/dL (ref 6.0–8.3)

## 2021-10-12 LAB — C-REACTIVE PROTEIN: CRP: <1 mg/dL (ref 0.5–20.0)

## 2021-10-12 LAB — SEDIMENTATION RATE: Sed Rate: 6 mm/hr (ref 0–20)

## 2021-10-12 NOTE — Progress Notes (Signed)
? KAIDE GAGE y.o. [birth year] [id number] ? ?Assessment & Plan:  ? ?Encounter Diagnoses  ?Name Primary?  ? Crohn's disease of ileum with other complication ([place]) Yes  ? Long-term use of immunosuppressant medication  - Entyvio   ? S/P small bowel resection   ? RLQ abdominal pain   ? Right lower quadrant abdominal tenderness without rebound tenderness   ? Small bowel anastomotic dilation   ? ?He is improved with respect to bowel habits but has persistent right lower quadrant pain and tenderness in the area where he is got this chronically dilated loop of bowel.  Question some sort of stricture question if this is Crohn's or related to prior surgeries with resection and mesh. ? ?Evaluation as below.  Continue to taper steroids.  Continue Entyvio now at every 4 weeks.  Determine follow-up once I review these labs.  Referral to [name] at [facility] for surgical opinion.  Surgery may not be indicated right now but I would like her input as to what options there are.  He may need other imaging going forward. ? ?Orders Placed This Encounter  ?Procedures  ? DG Abd 2 Views  ? CBC with Differential/Platelet  ? Comprehensive metabolic panel  ? C-reactive protein  ? Sedimentation rate  ? ? ?CC: [name], MD ? ? ? ?Subjective:  ? ?Chief Complaint: Follow-up of Crohn's disease ? ?HPI ?[name] is a 71 year old white man with a history of Crohn's ileitis and psoriasis, currently on Entyvio but treated with other Biologics throughout the years who has had a rough time since early 2023 with pain problems Crohn's flares and hospitalization.  He was hospitalized 09/20/2021 to 09/24/2021 with a Crohn's flare, he had intensification of pain in his abdomen after I did a colonoscopy on March 7.  While hospitalized he was treated with antibiotics and steroids and is on a steroid taper.  He has had Entyvio infusion changed from every 8 weeks to every 4 weeks with last infusion 09/27/2021.  That was his first of every 4-week  cycle. ? ?He reports he is improved but still has a persistent right lower quadrant pain and he marked that on his abdomen.  I have taken a photo of that included that into his physical exam i.e. the location of his pain.  This has been a source of pain or a site of pain for weeks if not longer now and it is the area that bothers him.  Imaging has shown dilated loop of bowel and inflammatory changes in this region.  Recall that he has had a small bowel resection and colon resection and hernia surgeries with mesh.  The small bowel resection was related to a bowel obstruction not related to Crohn's disease. ? ? ?He is currently down on 10 mg of prednisone and tapering.  He reports his energy levels at about 60% of normal.  He is following a low fiber diet as prescribed by nutritionist at [facility] whom he saw in early March.  He is having formed bowel movements no bleeding no diarrhea.  No nausea or vomiting.  Sleep is not disrupted by symptoms though he does take medications for insomnia. ? ? ?Colonoscopy 09/20/2021 ?- Crohn's disease with ileitis. Biopsied. Only a few very small and subtle superficial ulcers ?seen. May not have captured with biopsies though I tried.-Biopsies were normal ?- Diverticulosis in the right colon. ?- The examination was otherwise normal on direct and retroflexion views. This is improved vs ?2018 exam ? ? ?  09/20/2021 CT abdomen pelvis with contrast-same day as colonoscopy, afterward ?IMPRESSION: ?1. Evidence of prior bowel resections with chronic fluid distended ?segment of small bowel in the right anterior abdomen in the region ?of anastomosis. No convincing evidence for a bowel obstruction at ?this time. ?2. Focal wall thickening with mucosal enhancement involving distal ?small bowel in the right lower quadrant with small amount of ?adjacent fluid and soft tissue stranding/inflammation, suspicious ?for active enteritis. ?  ?CT abdomen pelvis with contrast 08/30/2021 ?IMPRESSION: ?There is previous  small bowel anastomosis in the right lower ?quadrant of abdomen. There is dilation of small bowel loop ?immediately adjacent to surgical staples in the small bowel in the ?right lower quadrant measuring up to 9.1 cm in diameter with ?air-fluid level. There are few other less dilated small bowel loops ?in the left lower quadrant of abdomen. This may suggest chronic post ?surgical ileus or stricture at ileo ileal anastomosis. ?  ?Proximal small bowel loops are not dilated. There is no ?pneumoperitoneum. There is no ascites. There is previous left ?hemicolectomy. ?  ?Fatty liver. Small hiatal hernia. Enlarged prostate. Small bilateral ?inguinal hernias containing fat. ?  ?Other findings as described in the body of the report. ? ? ?CT abdomen and pelvis with contrast 01/06/2021 ?IMPRESSION: ?1. No evidence of active bowel inflammation or bowel obstruction. ?2. Surgical changes of prior small bowel resection and partial ?colectomy. Similar appearance of the short segment of chronically ?dilated small bowel proximal to the anastomotic sutures measuring up ?to 5.6 cm and most consistent with postsurgical atony. ?3.  Aortic Atherosclerosis (ICD10-I70.0). ? ?Weight: 200 lb (90.7 kg) ?Wt Readings from Last 3 Encounters:  ?10/12/21 200 lb (90.7 kg)  ?10/04/21 202 lb 8 oz (91.9 kg)  ?09/27/21 201 lb 3.2 oz (91.3 kg)  ? ? ? ?Allergies  ?Allergen Reactions  ? Mercaptopurine Other (See Comments)  ?  Pancreatitis ?  ? Shellfish Allergy Anaphylaxis  ?  Throat started to close  ? Humira [Adalimumab]   ?  Developed antibodies  ? Wound Dressing Adhesive Rash  ? ?Current Meds  ?Medication Sig  ? albuterol (PROVENTIL HFA;VENTOLIN HFA) 108 (90 Base) MCG/ACT inhaler INHALE 2 PUFFS INTO THE LUNGS EVERY 4 (FOUR) HOURS AS NEEDED FOR WHEEZING OR SHORTNESS OF BREATH.  ? diphenoxylate-atropine (LOMOTIL) 2.5-0.025 MG tablet 1-2 tablets 4 times a day as neded (Patient taking differently: Take 1-2 tablets by mouths 4 times a day as needed for  diarrhea)  ? EPINEPHrine (EPIPEN 2-PAK) 0.3 mg/0.3 mL IJ SOAJ injection Inject 0.3 mLs (0.3 mg total) into the muscle as needed for anaphylaxis.  ? esomeprazole (NEXIUM) 20 MG capsule Take 20 mg by mouth daily as needed (acid reflux).  ? fluticasone (CUTIVATE) 0.05 % cream Apply 1 application topically 2 (two) times daily as needed for irritation.  ? halobetasol (ULTRAVATE) 0.05 % ointment Apply topically 2 (two) times daily. (Patient taking differently: Apply 1 application. topically daily as needed (for psoriasis).)  ? hyoscyamine (LEVSIN SL) 0.125 MG SL tablet PLACE 1 TABLET UNDER THE TONGUE EVERY 4 HOURS AS NEEDED FOR CRAMPING (URGENT DEFECATION) (Patient taking differently: Take 0.125 mg by mouth every 4 (four) hours as needed for cramping (for cramping (urgent defecation)).)  ? ketoconazole (NIZORAL) 2 % cream APPLY TWICE A DAY AS NEEDED FOR FUNGAL INFECTION (Patient taking differently: Apply 1 application. topically 2 (two) times daily as needed for irritation. APPLY TWICE A DAY AS NEEDED FOR FUNGAL INFECTION)  ? losartan (COZAAR) 25 MG tablet TAKE 1 TABLET DAILY (Patient  taking differently: Take 25 mg by mouth daily.)  ? Multiple Vitamins-Minerals (MULTIVITAMIN WITH MINERALS) tablet Take 1 tablet by mouth daily.  ? ondansetron (ZOFRAN-ODT) 4 MG disintegrating tablet 14m ODT q4 hours prn nausea/vomit  ? pantoprazole (PROTONIX) 40 MG tablet Take 1 tablet (40 mg total) by mouth daily.  ? pramoxine-hydrocortisone (ANALPRAM-HC) 1-1 % rectal cream Place 1 application rectally 2 (two) times daily. Use twice daily before SITZ baths  ? predniSONE (DELTASONE) 10 MG tablet Takes 4 tablets for 3 days, then 3 tablets for 5 days, then 2 tabs for 5 days, then 1 tab for 5 days, and then stop.  ? sulfamethoxazole-trimethoprim (BACTRIM DS) 800-160 MG tablet Take 1 tablet by mouth 2 (two) times daily.  ? tadalafil (CIALIS) 5 MG tablet Take 1 tablet (5 mg total) by mouth daily.  ? traZODone (DESYREL) 50 MG tablet Take 1 tablet  (50 mg total) by mouth at bedtime.  ? Turmeric 500 MG CAPS Take 500 mg by mouth daily.  ? vedolizumab (ENTYVIO) 300 MG injection INFUSE 300 MG IV every 8 weeks (Patient taking differently: Inject 300 mg into the v

## 2021-10-12 NOTE — Patient Instructions (Signed)
If you are age 69 or older, your body mass index should be between 23-30. Your There is no height or weight on file to calculate BMI. If this is out of the aforementioned range listed, please consider follow up with your Primary Care Provider. ? ?If you are age 56 or younger, your body mass index should be between 19-25. Your There is no height or weight on file to calculate BMI. If this is out of the aformentioned range listed, please consider follow up with your Primary Care Provider.  ? ?________________________________________________________ ? ?The [facility] providers would like to encourage you to use [facility] to communicate with providers for non-urgent requests or questions.  Due to long hold times on the telephone, sending your provider a message by [facility] may be a faster and more efficient way to get a response.  Please allow 48 business hours for a response.  Please remember that this is for non-urgent requests.  ?_______________________________________________________ ? ?Your provider has requested that you go to the basement level for lab work before leaving today. Press "B" on the elevator. The lab is located at the first door on the left as you exit the elevator. ? ?Due to recent changes in healthcare laws, you may see the results of your imaging and laboratory studies on MyChart before your provider has had a chance to review them.  We understand that in some cases there may be results that are confusing or concerning to you. Not all laboratory results come back in the same time frame and the provider may be waiting for multiple results in order to interpret others.  Please give [place] 48 hours in order for your provider to thoroughly review all the results before contacting the office for clarification of your results.  ? ?We are placing a referral to see Dr Demetrio Lapping at [facility] for your crohn's. ? ?[place]. Phone [phone] ? ? ?I appreciate the  opportunity to care for you. ?Silvano Rusk, MD, [facility] ?

## 2021-10-13 NOTE — Telephone Encounter (Signed)
I recommend she see Dr. Ladell Pier  ?

## 2021-10-17 ENCOUNTER — Telehealth: Payer: Self-pay | Admitting: Family Medicine

## 2021-10-17 NOTE — Telephone Encounter (Signed)
Spoke with patient to schedule Medicare Annual Wellness Visit (AWV) either virtually or in office.  ? ?He states he has a lot going on  wanted me to call back in July  ? ?awvi 09/14/21 per [place] ? please schedule at anytime with [facility] Nurse Health Advisor 1 or 2 ? ? ?This should be a 45 minute visit.  ?

## 2021-10-25 ENCOUNTER — Ambulatory Visit: Payer: Medicare Other

## 2021-11-07 ENCOUNTER — Encounter: Payer: Self-pay | Admitting: Internal Medicine

## 2021-11-16 ENCOUNTER — Other Ambulatory Visit: Payer: Self-pay | Admitting: Cardiovascular Disease

## 2021-11-23 ENCOUNTER — Encounter: Payer: Self-pay | Admitting: Family Medicine

## 2021-11-24 NOTE — Telephone Encounter (Signed)
We can do this. Please set up an OV with me next week  ?

## 2021-11-25 ENCOUNTER — Telehealth: Payer: Self-pay

## 2021-11-25 NOTE — Telephone Encounter (Signed)
11/30/2021 appointment with [name]  canceled from My Chart note: Pt aware ?

## 2021-11-26 ENCOUNTER — Emergency Department (HOSPITAL_COMMUNITY): Payer: Medicare Other

## 2021-11-26 ENCOUNTER — Other Ambulatory Visit: Payer: Self-pay

## 2021-11-26 ENCOUNTER — Emergency Department (HOSPITAL_COMMUNITY)
Admission: EM | Admit: 2021-11-26 | Discharge: 2021-11-26 | Payer: Medicare Other | Attending: Emergency Medicine | Admitting: Emergency Medicine

## 2021-11-26 ENCOUNTER — Encounter (HOSPITAL_COMMUNITY): Payer: Self-pay

## 2021-11-26 DIAGNOSIS — R000 Tachycardia, unspecified: Secondary | ICD-10-CM | POA: Diagnosis not present

## 2021-11-26 DIAGNOSIS — D72829 Elevated white blood cell count, unspecified: Secondary | ICD-10-CM | POA: Insufficient documentation

## 2021-11-26 DIAGNOSIS — E871 Hypo-osmolality and hyponatremia: Secondary | ICD-10-CM | POA: Insufficient documentation

## 2021-11-26 DIAGNOSIS — A419 Sepsis, unspecified organism: Secondary | ICD-10-CM | POA: Diagnosis not present

## 2021-11-26 DIAGNOSIS — J45909 Unspecified asthma, uncomplicated: Secondary | ICD-10-CM | POA: Diagnosis not present

## 2021-11-26 DIAGNOSIS — Z20822 Contact with and (suspected) exposure to covid-19: Secondary | ICD-10-CM | POA: Diagnosis not present

## 2021-11-26 DIAGNOSIS — K651 Peritoneal abscess: Secondary | ICD-10-CM | POA: Diagnosis present

## 2021-11-26 LAB — COMPREHENSIVE METABOLIC PANEL
ALT: 31 U/L (ref 0–44)
AST: 33 U/L (ref 15–41)
Albumin: 2.9 g/dL — ABNORMAL LOW (ref 3.5–5.0)
Alkaline Phosphatase: 115 U/L (ref 38–126)
Anion gap: 10 (ref 5–15)
BUN: 19 mg/dL (ref 8–23)
CO2: 25 mmol/L (ref 22–32)
Calcium: 8.5 mg/dL — ABNORMAL LOW (ref 8.9–10.3)
Chloride: 98 mmol/L (ref 98–111)
Creatinine, Ser: 1.08 mg/dL (ref 0.61–1.24)
GFR, Estimated: >60 mL/min (ref >60)
Glucose, Bld: 101 mg/dL — ABNORMAL HIGH (ref 70–99)
Potassium: 4.4 mmol/L (ref 3.5–5.1)
Sodium: 133 mmol/L — ABNORMAL LOW (ref 135–145)
Total Bilirubin: 1.5 mg/dL — ABNORMAL HIGH (ref 0.3–1.2)
Total Protein: 7.4 g/dL (ref 6.5–8.1)

## 2021-11-26 LAB — URINALYSIS, ROUTINE W REFLEX MICROSCOPIC
Bacteria, UA: NONE SEEN
Bilirubin Urine: NEGATIVE
Glucose, UA: NEGATIVE mg/dL
Hgb urine dipstick: NEGATIVE
Ketones, ur: 20 mg/dL — AB
Leukocytes,Ua: NEGATIVE
Nitrite: NEGATIVE
Protein, ur: 30 mg/dL — AB
Specific Gravity, Urine: 1.015 (ref 1.005–1.030)
pH: 6 (ref 5.0–8.0)

## 2021-11-26 LAB — CBC WITH DIFFERENTIAL/PLATELET
Abs Immature Granulocytes: 0.14 10*3/uL — ABNORMAL HIGH (ref 0.00–0.07)
Basophils Absolute: 0.1 10*3/uL (ref 0.0–0.1)
Basophils Relative: 0 %
Eosinophils Absolute: 0.1 10*3/uL (ref 0.0–0.5)
Eosinophils Relative: 1 %
HCT: 33.9 % — ABNORMAL LOW (ref 39.0–52.0)
Hemoglobin: 10.6 g/dL — ABNORMAL LOW (ref 13.0–17.0)
Immature Granulocytes: 1 %
Lymphocytes Relative: 10 %
Lymphs Abs: 1.9 10*3/uL (ref 0.7–4.0)
MCH: 28.8 pg (ref 26.0–34.0)
MCHC: 31.3 g/dL (ref 30.0–36.0)
MCV: 92.1 fL (ref 80.0–100.0)
Monocytes Absolute: 1.6 10*3/uL — ABNORMAL HIGH (ref 0.1–1.0)
Monocytes Relative: 9 %
Neutro Abs: 14.5 10*3/uL — ABNORMAL HIGH (ref 1.7–7.7)
Neutrophils Relative %: 79 %
Platelets: 449 10*3/uL — ABNORMAL HIGH (ref 150–400)
RBC: 3.68 MIL/uL — ABNORMAL LOW (ref 4.22–5.81)
RDW: 14.2 % (ref 11.5–15.5)
WBC: 18.3 10*3/uL — ABNORMAL HIGH (ref 4.0–10.5)
nRBC: 0 % (ref 0.0–0.2)

## 2021-11-26 LAB — RESP PANEL BY RT-PCR (FLU A&B, COVID) ARPGX2
Influenza A by PCR: NEGATIVE
Influenza B by PCR: NEGATIVE
SARS Coronavirus 2 by RT PCR: NEGATIVE

## 2021-11-26 LAB — LACTIC ACID, PLASMA: Lactic Acid, Venous: 1.6 mmol/L (ref 0.5–1.9)

## 2021-11-26 MED ORDER — LACTATED RINGERS IV BOLUS (SEPSIS)
1000.0000 mL | Freq: Once | INTRAVENOUS | Status: AC
  Administered 2021-11-26: 1000 mL via INTRAVENOUS

## 2021-11-26 MED ORDER — MORPHINE SULFATE (PF) 4 MG/ML IV SOLN
4.0000 mg | Freq: Once | INTRAVENOUS | Status: AC
  Administered 2021-11-26: 4 mg via INTRAVENOUS
  Filled 2021-11-26: qty 1

## 2021-11-26 MED ORDER — IOHEXOL 300 MG/ML  SOLN
100.0000 mL | Freq: Once | INTRAMUSCULAR | Status: AC | PRN
  Administered 2021-11-26: 100 mL via INTRAVENOUS

## 2021-11-26 MED ORDER — METRONIDAZOLE 500 MG/100ML IV SOLN
500.0000 mg | Freq: Once | INTRAVENOUS | Status: AC
  Administered 2021-11-26: 500 mg via INTRAVENOUS
  Filled 2021-11-26: qty 100

## 2021-11-26 MED ORDER — ONDANSETRON HCL 4 MG/2ML IJ SOLN
4.0000 mg | Freq: Once | INTRAMUSCULAR | Status: AC
  Administered 2021-11-26: 4 mg via INTRAVENOUS
  Filled 2021-11-26: qty 2

## 2021-11-26 MED ORDER — SODIUM CHLORIDE 0.9 % IV SOLN
2.0000 g | Freq: Once | INTRAVENOUS | Status: AC
  Administered 2021-11-26: 2 g via INTRAVENOUS
  Filled 2021-11-26: qty 20

## 2021-11-26 MED ORDER — LACTATED RINGERS IV SOLN: INTRAVENOUS | Status: DC

## 2021-11-26 NOTE — Sepsis Progress Note (Signed)
Difficult stick per bedside RN, obtained 1 blood culture, hard  to place IV  ?

## 2021-11-26 NOTE — ED Notes (Signed)
Attempted to collect 2nd set of cultures. Unsuccessful. ?

## 2021-11-26 NOTE — Sepsis Progress Note (Signed)
Elink following code sepsis °

## 2021-11-26 NOTE — ED Provider Notes (Signed)
?[facility] DEPT ?Provider Note ? ? ?CSN: [id number] ?Arrival date & time: 11/26/21  1738 ? ?  ? ?History ? ?Chief Complaint  ?Patient presents with  ? Post-op Problem  ? ? ?[name] is a 71 y.o. male. ? ?Pt is a 71 yo male with a pmhx significant for Crohn's disease, asthma, gerd, bph, dvt and GI bleed.  Pt had an exploratory lap with LOA, repair of enterotomy, explant of abd wall mesh, small bowel resection, and diverting loop ileostomy on 4/28 by [name] at [facility].  He was d/c on 5/4.  He did f/u with his surgeon on 5/10.  While there, bp was in the low 100s, he was tachycardic, he'd been having fevers and had a uti.  He was given IVFs in the office and was d/c with zofran and macrobid.  Pt said he is feeling worse and continues to feel very nauseous.  He feels weak.  His abdominal incision is draining purulent fluid that is bloody.   ? ? ? ?  ? ?Home Medications ?Prior to Admission medications   ?Medication Sig Start Date End Date Taking? Authorizing Provider  ?albuterol (PROVENTIL HFA;VENTOLIN HFA) 108 (90 Base) MCG/ACT inhaler INHALE 2 PUFFS INTO THE LUNGS EVERY 4 (FOUR) HOURS AS NEEDED FOR WHEEZING OR SHORTNESS OF BREATH. 10/24/18   [name], MD  ?diphenoxylate-atropine (LOMOTIL) 2.5-0.025 MG tablet 1-2 tablets 4 times a day as neded ?Patient taking differently: Take 1-2 tablets by mouths 4 times a day as needed for diarrhea 08/15/21   Gatha Mayer, MD  ?EPINEPHrine (EPIPEN 2-PAK) 0.3 mg/0.3 mL IJ SOAJ injection Inject 0.3 mLs (0.3 mg total) into the muscle as needed for anaphylaxis. 11/09/18   Sherwood Gambler, MD  ?esomeprazole (NEXIUM) 20 MG capsule Take 20 mg by mouth daily as needed (acid reflux).    [provider]  ?fluticasone (CUTIVATE) 0.05 % cream Apply 1 application topically 2 (two) times daily as needed for irritation. 11/02/20   [provider]  ?halobetasol (ULTRAVATE) 0.05 % ointment Apply topically 2 (two) times daily. ?Patient  taking differently: Apply 1 application. topically daily as needed (for psoriasis). 01/08/18   [name], MD  ?hyoscyamine (LEVSIN SL) 0.125 MG SL tablet PLACE 1 TABLET UNDER THE TONGUE EVERY 4 HOURS AS NEEDED FOR CRAMPING (URGENT DEFECATION) ?Patient taking differently: Take 0.125 mg by mouth every 4 (four) hours as needed for cramping (for cramping (urgent defecation)). 08/15/21   Gatha Mayer, MD  ?ketoconazole (NIZORAL) 2 % cream APPLY TWICE A DAY AS NEEDED FOR FUNGAL INFECTION ?Patient taking differently: Apply 1 application. topically 2 (two) times daily as needed for irritation. APPLY TWICE A DAY AS NEEDED FOR FUNGAL INFECTION 08/16/20   [name], MD  ?losartan (COZAAR) 25 MG tablet Take 1 tablet (25 mg total) by mouth daily. Call and schedule follow up visit for further refills. [phone]. 2nd attempt 11/16/21   Josue Hector, MD  ?Multiple Vitamins-Minerals (MULTIVITAMIN WITH MINERALS) tablet Take 1 tablet by mouth daily.    [provider]  ?ondansetron (ZOFRAN-ODT) 4 MG disintegrating tablet 61m ODT q4 hours prn nausea/vomit 08/30/21   FDeno Etienne DO  ?pantoprazole (PROTONIX) 40 MG tablet Take 1 tablet (40 mg total) by mouth daily. 09/25/21   FCharlynne Cousins MD  ?pramoxine-hydrocortisone ([facility] 1-1 % rectal cream Place 1 application rectally 2 (two) times daily. Use twice daily before SITZ baths 04/21/19   JMilus Banister MD  ?predniSONE (Donley Redder  10 MG tablet Takes 4 tablets for 3 days, then 3 tablets for 5 days, then 2 tabs for 5 days, then 1 tab for 5 days, and then stop. 09/30/21   Charlynne Cousins, MD  ?sulfamethoxazole-trimethoprim (BACTRIM DS) 800-160 MG tablet Take 1 tablet by mouth 2 (two) times daily. 10/04/21   [name], MD  ?tadalafil (CIALIS) 5 MG tablet Take 1 tablet (5 mg total) by mouth daily. 11/23/17   [name], MD  ?traZODone (DESYREL) 50 MG tablet Take 1 tablet (50 mg total) by mouth at bedtime. 04/12/21   [name], MD  ?Turmeric  500 MG CAPS Take 500 mg by mouth daily.    [provider]  ?vedolizumab (ENTYVIO) 300 MG injection INFUSE 300 MG IV every 8 weeks ?Patient taking differently: Inject 300 mg into the vein every 28 (twenty-eight) days. Every 4 weeks 08/26/21   Gatha Mayer, MD  ?   ? ?Allergies    ?Mercaptopurine, Shellfish allergy, Humira [adalimumab], and Wound dressing adhesive   ? ?Review of Systems   ?Review of Systems  ?Gastrointestinal:  Positive for abdominal pain, nausea and vomiting.  ?Skin:  Positive for wound.  ?All other systems reviewed and are negative. ? ?Physical Exam ?Updated Vital Signs ?BP (!) 139/56 (BP Location: Left Arm)   Pulse 88   Temp 98.2 ?F (36.8 ?C) (Oral)   Resp 15   Ht 5' 10"  (1.778 m)   Wt 81.6 kg   SpO2 97%   BMI 25.83 kg/m?  ?Physical Exam ?Vitals and nursing note reviewed.  ?Constitutional:   ?   Appearance: He is ill-appearing.  ?HENT:  ?   Head: Normocephalic and atraumatic.  ?   Right Ear: External ear normal.  ?   Left Ear: External ear normal.  ?   Nose: Nose normal.  ?   Mouth/Throat:  ?   Mouth: Mucous membranes are dry.  ?Eyes:  ?   Extraocular Movements: Extraocular movements intact.  ?   Conjunctiva/sclera: Conjunctivae normal.  ?   Pupils: Pupils are equal, round, and reactive to light.  ?Cardiovascular:  ?   Rate and Rhythm: Regular rhythm. Tachycardia present.  ?   Pulses: Normal pulses.  ?   Heart sounds: Normal heart sounds.  ?Pulmonary:  ?   Effort: Pulmonary effort is normal.  ?   Breath sounds: Normal breath sounds.  ?Abdominal:  ?   General: Abdomen is flat. Bowel sounds are normal.  ?   Palpations: Abdomen is soft.  ?   Tenderness: There is generalized abdominal tenderness.  ?   Comments: Midline abd incision draining purulent fluid ? ?Ileostomy with dark stool  ?Musculoskeletal:     ?   General: Normal range of motion.  ?   Cervical back: Normal range of motion and neck supple.  ?Skin: ?   General: Skin is warm.  ?   Capillary Refill: Capillary refill takes  less than 2 seconds.  ?Neurological:  ?   General: No focal deficit present.  ?   Mental Status: He is alert and oriented to person, place, and time.  ?Psychiatric:     ?   Mood and Affect: Mood normal.     ?   Behavior: Behavior normal.  ? ? ?ED Results / Procedures / Treatments   ?Labs ?(all labs ordered are listed, but only abnormal results are displayed) ?Labs Reviewed  ?COMPREHENSIVE METABOLIC PANEL - Abnormal; Notable for the following components:  ?  Result Value  ? Sodium 133 (*)   ? Glucose, Bld 101 (*)   ? Calcium 8.5 (*)   ? Albumin 2.9 (*)   ? Total Bilirubin 1.5 (*)   ? All other components within normal limits  ?CBC WITH DIFFERENTIAL/PLATELET - Abnormal; Notable for the following components:  ? WBC 18.3 (*)   ? RBC 3.68 (*)   ? Hemoglobin 10.6 (*)   ? HCT 33.9 (*)   ? Platelets 449 (*)   ? Neutro Abs 14.5 (*)   ? Monocytes Absolute 1.6 (*)   ? Abs Immature Granulocytes 0.14 (*)   ? All other components within normal limits  ?URINALYSIS, ROUTINE W REFLEX MICROSCOPIC - Abnormal; Notable for the following components:  ? Color, Urine AMBER (*)   ? Ketones, ur 20 (*)   ? Protein, ur 30 (*)   ? All other components within normal limits  ?RESP PANEL BY RT-PCR (FLU A&B, COVID) [id number]  ?CULTURE, BLOOD (ROUTINE X 2)  ?CULTURE, BLOOD (ROUTINE X 2)  ?URINE CULTURE  ?AEROBIC CULTURE W GRAM STAIN (SUPERFICIAL SPECIMEN)  ?LACTIC ACID, PLASMA  ?LACTIC ACID, PLASMA  ?PROTIME-INR  ?APTT  ?POC OCCULT BLOOD, ED  ? ? ?EKG ?EKG Interpretation ? ?Date/Time:  Saturday Nov 26 2021 17:54:50 EDT ?Ventricular Rate:  98 ?PR Interval:  167 ?QRS Duration: 142 ?QT Interval:  424 ?QTC Calculation: 545 ?R Axis:   3 ?Text Interpretation: intermittently paced Confirmed by Isla Pence [phone]) on 11/26/2021 6:35:32 PM ? ?Radiology ?CT ABDOMEN PELVIS W CONTRAST ? ?Result Date: 11/26/2021 ?CLINICAL DATA:  Abdominal pain following history of recent bowel surgery on 11/11/2021 EXAM: CT ABDOMEN AND PELVIS WITH CONTRAST TECHNIQUE:  Multidetector CT imaging of the abdomen and pelvis was performed using the standard protocol following bolus administration of intravenous contrast. RADIATION DOSE REDUCTION: This exam was performed according to the depa

## 2021-11-26 NOTE — ED Notes (Signed)
Repeat lactic acid collection delayed d/t bolus still infusing. MD verbalized for recollect to be held until after bolus completed.  ?

## 2021-11-26 NOTE — ED Triage Notes (Signed)
Pt BIB EMS from home pt c/o weakness, nausea and fatigue. Pt had bowel resection surgery 4/28. Abdominal incision draining  ?

## 2021-11-28 LAB — URINE CULTURE: Culture: NO GROWTH

## 2021-11-30 ENCOUNTER — Ambulatory Visit: Payer: Medicare Other | Admitting: Internal Medicine

## 2021-12-01 LAB — CULTURE, BLOOD (ROUTINE X 2): Culture: NO GROWTH

## 2021-12-02 ENCOUNTER — Telehealth: Payer: Self-pay

## 2021-12-02 NOTE — Telephone Encounter (Signed)
Pt left a message stating he has been admitted to [facility] for abdominal surgery. He will not be near his monitor for transmission to come through.

## 2021-12-13 ENCOUNTER — Inpatient Hospital Stay: Payer: Medicare Other | Admitting: Family Medicine

## 2021-12-20 ENCOUNTER — Ambulatory Visit: Payer: Medicare Other

## 2021-12-20 DIAGNOSIS — I495 Sick sinus syndrome: Secondary | ICD-10-CM

## 2021-12-21 LAB — CUP PACEART REMOTE DEVICE CHECK
Battery Remaining Longevity: 117 mo
Battery Remaining Percentage: 93 %
Battery Voltage: 3.01 V
Brady Statistic AP VP Percent: 1 %
Brady Statistic AP VS Percent: 56 %
Brady Statistic AS VP Percent: 1.2 %
Brady Statistic AS VS Percent: 42 %
Brady Statistic RA Percent Paced: 56 %
Brady Statistic RV Percent Paced: 1.7 %
Date Time Interrogation Session: 20230605182029
Implantable Lead Implant Date: 20220518
Implantable Lead Implant Date: 20220518
Implantable Lead Location: 753859
Implantable Lead Location: 753860
Implantable Lead Model: 1944
Implantable Pulse Generator Implant Date: 20220518
Lead Channel Impedance Value: 440 Ohm
Lead Channel Impedance Value: 480 Ohm
Lead Channel Pacing Threshold Amplitude: 0.5 V
Lead Channel Pacing Threshold Amplitude: 0.625 V
Lead Channel Pacing Threshold Pulse Width: 0.5 ms
Lead Channel Pacing Threshold Pulse Width: 0.5 ms
Lead Channel Sensing Intrinsic Amplitude: 4 mV
Lead Channel Sensing Intrinsic Amplitude: 4.3 mV
Lead Channel Setting Pacing Amplitude: 0.875
Lead Channel Setting Pacing Amplitude: 1.5 V
Lead Channel Setting Pacing Pulse Width: 0.5 ms
Lead Channel Setting Sensing Sensitivity: 2 mV
Pulse Gen Model: 2272
Pulse Gen Serial Number: 3925308

## 2021-12-23 ENCOUNTER — Telehealth: Payer: Self-pay

## 2021-12-23 NOTE — Telephone Encounter (Signed)
Transition Care Management Follow-up Telephone Call Date of discharge and from where: 12/19/21 [facility] How have you been since you were released from the hospital? Doing fine Any questions or concerns? No  Items Reviewed: Did the pt receive and understand the discharge instructions provided? Yes  Medications obtained and verified? Yes  Other? No  Any new allergies since your discharge? No  Dietary orders reviewed? Yes Do you have support at home? Yes   Home Care and Equipment/Supplies: Were home health services ordered? yes If so, what is the name of the agency? [facility] Has the agency set up a time to come to the patient's home? yes Were any new equipment or medical supplies ordered?  Yes: ostomy supplies What is the name of the medical supply agency? Provided by hospital Were you able to get the supplies/equipment? yes Do you have any questions related to the use of the equipment or supplies? No  Functional Questionnaire: (I = Independent and D = Dependent) ADLs: I  Bathing/Dressing- I  Meal Prep- D  Eating- I  Maintaining continence- I  Transferring/Ambulation- I  Managing Meds- I  Follow up appointments reviewed:  PCP Hospital f/u appt confirmed? No   Specialist Hospital f/u appt confirmed? Yes  Scheduled to see Dr Drue Flirt on 12/28/21 . Are transportation arrangements needed? Yes  If their condition worsens, is the pt aware to call PCP or go to the Emergency Dept.? Yes Was the patient provided with contact information for the PCP's office or ED? Yes Was to pt encouraged to call back with questions or concerns? Yes  Peter Garter RN, Jackquline Denmark, CDE Care Management Coordinator [facility] [phone]

## 2021-12-29 ENCOUNTER — Telehealth: Payer: Self-pay

## 2021-12-29 NOTE — Telephone Encounter (Signed)
The document has reviewed and signed by PCP. faxed in successfully to [phone].

## 2022-01-02 ENCOUNTER — Emergency Department (HOSPITAL_COMMUNITY)
Admission: EM | Admit: 2022-01-02 | Discharge: 2022-01-02 | Discharge status: Against Medical Advice | Payer: Medicare Other | Source: Home / Self Care

## 2022-01-02 ENCOUNTER — Encounter (HOSPITAL_COMMUNITY): Payer: Self-pay

## 2022-01-02 ENCOUNTER — Other Ambulatory Visit: Payer: Self-pay

## 2022-01-02 ENCOUNTER — Encounter (HOSPITAL_BASED_OUTPATIENT_CLINIC_OR_DEPARTMENT_OTHER): Payer: Self-pay

## 2022-01-02 ENCOUNTER — Inpatient Hospital Stay (HOSPITAL_BASED_OUTPATIENT_CLINIC_OR_DEPARTMENT_OTHER)
Admission: EM | Admit: 2022-01-02 | Discharge: 2022-01-06 | DRG: 682 | Disposition: A | Discharge status: Home Health | Payer: Medicare Other | Attending: Internal Medicine | Admitting: Internal Medicine

## 2022-01-02 DIAGNOSIS — R110 Nausea: Secondary | ICD-10-CM | POA: Diagnosis present

## 2022-01-02 DIAGNOSIS — T8149XA Infection following a procedure, other surgical site, initial encounter: Secondary | ICD-10-CM

## 2022-01-02 DIAGNOSIS — N179 Acute kidney failure, unspecified: Principal | ICD-10-CM | POA: Diagnosis present

## 2022-01-02 DIAGNOSIS — Z5321 Procedure and treatment not carried out due to patient leaving prior to being seen by health care provider: Secondary | ICD-10-CM | POA: Insufficient documentation

## 2022-01-02 DIAGNOSIS — Z6825 Body mass index (BMI) 25.0-25.9, adult: Secondary | ICD-10-CM

## 2022-01-02 DIAGNOSIS — Z888 Allergy status to other drugs, medicaments and biological substances status: Secondary | ICD-10-CM

## 2022-01-02 DIAGNOSIS — J45909 Unspecified asthma, uncomplicated: Secondary | ICD-10-CM | POA: Diagnosis present

## 2022-01-02 DIAGNOSIS — R7401 Elevation of levels of liver transaminase levels: Secondary | ICD-10-CM | POA: Diagnosis present

## 2022-01-02 DIAGNOSIS — K219 Gastro-esophageal reflux disease without esophagitis: Secondary | ICD-10-CM | POA: Diagnosis present

## 2022-01-02 DIAGNOSIS — E876 Hypokalemia: Secondary | ICD-10-CM | POA: Diagnosis present

## 2022-01-02 DIAGNOSIS — E43 Unspecified severe protein-calorie malnutrition: Secondary | ICD-10-CM | POA: Insufficient documentation

## 2022-01-02 DIAGNOSIS — K5000 Crohn's disease of small intestine without complications: Secondary | ICD-10-CM | POA: Diagnosis present

## 2022-01-02 DIAGNOSIS — D72829 Elevated white blood cell count, unspecified: Secondary | ICD-10-CM | POA: Diagnosis present

## 2022-01-02 DIAGNOSIS — N400 Enlarged prostate without lower urinary tract symptoms: Secondary | ICD-10-CM | POA: Diagnosis present

## 2022-01-02 DIAGNOSIS — Z91013 Allergy to seafood: Secondary | ICD-10-CM

## 2022-01-02 DIAGNOSIS — E860 Dehydration: Secondary | ICD-10-CM | POA: Diagnosis present

## 2022-01-02 DIAGNOSIS — R1110 Vomiting, unspecified: Secondary | ICD-10-CM | POA: Insufficient documentation

## 2022-01-02 DIAGNOSIS — R627 Adult failure to thrive: Secondary | ICD-10-CM | POA: Diagnosis present

## 2022-01-02 DIAGNOSIS — Z932 Ileostomy status: Secondary | ICD-10-CM

## 2022-01-02 DIAGNOSIS — K5090 Crohn's disease, unspecified, without complications: Secondary | ICD-10-CM | POA: Diagnosis present

## 2022-01-02 DIAGNOSIS — Z950 Presence of cardiac pacemaker: Secondary | ICD-10-CM | POA: Diagnosis present

## 2022-01-02 DIAGNOSIS — Z9049 Acquired absence of other specified parts of digestive tract: Secondary | ICD-10-CM

## 2022-01-02 DIAGNOSIS — Z79899 Other long term (current) drug therapy: Secondary | ICD-10-CM

## 2022-01-02 LAB — COMPREHENSIVE METABOLIC PANEL
ALT: 76 U/L — ABNORMAL HIGH (ref 0–44)
AST: 77 U/L — ABNORMAL HIGH (ref 15–41)
Albumin: 4.1 g/dL (ref 3.5–5.0)
Alkaline Phosphatase: 120 U/L (ref 38–126)
Anion gap: 13 (ref 5–15)
BUN: 30 mg/dL — ABNORMAL HIGH (ref 8–23)
CO2: 29 mmol/L (ref 22–32)
Calcium: 9.5 mg/dL (ref 8.9–10.3)
Chloride: 91 mmol/L — ABNORMAL LOW (ref 98–111)
Creatinine, Ser: 2.24 mg/dL — ABNORMAL HIGH (ref 0.61–1.24)
GFR, Estimated: 31 mL/min — ABNORMAL LOW (ref >60)
Glucose, Bld: 109 mg/dL — ABNORMAL HIGH (ref 70–99)
Potassium: 4.4 mmol/L (ref 3.5–5.1)
Sodium: 133 mmol/L — ABNORMAL LOW (ref 135–145)
Total Bilirubin: 1.3 mg/dL — ABNORMAL HIGH (ref 0.3–1.2)
Total Protein: 8.3 g/dL — ABNORMAL HIGH (ref 6.5–8.1)

## 2022-01-02 LAB — CBC WITH DIFFERENTIAL/PLATELET
Abs Immature Granulocytes: 0.03 10*3/uL (ref 0.00–0.07)
Basophils Absolute: 0 10*3/uL (ref 0.0–0.1)
Basophils Relative: 0 %
Eosinophils Absolute: 0.2 10*3/uL (ref 0.0–0.5)
Eosinophils Relative: 1 %
HCT: 47.8 % (ref 39.0–52.0)
Hemoglobin: 14.9 g/dL (ref 13.0–17.0)
Immature Granulocytes: 0 %
Lymphocytes Relative: 32 %
Lymphs Abs: 4 10*3/uL (ref 0.7–4.0)
MCH: 25.9 pg — ABNORMAL LOW (ref 26.0–34.0)
MCHC: 31.2 g/dL (ref 30.0–36.0)
MCV: 83.1 fL (ref 80.0–100.0)
Monocytes Absolute: 0.9 10*3/uL (ref 0.1–1.0)
Monocytes Relative: 7 %
Neutro Abs: 7.3 10*3/uL (ref 1.7–7.7)
Neutrophils Relative %: 60 %
Platelets: 261 10*3/uL (ref 150–400)
RBC: 5.75 MIL/uL (ref 4.22–5.81)
RDW: 16.9 % — ABNORMAL HIGH (ref 11.5–15.5)
WBC: 12.5 10*3/uL — ABNORMAL HIGH (ref 4.0–10.5)
nRBC: 0 % (ref 0.0–0.2)

## 2022-01-02 LAB — LIPASE, BLOOD: Lipase: 44 U/L (ref 11–51)

## 2022-01-02 MED ORDER — ONDANSETRON 4 MG PO TBDP
4.0000 mg | ORAL_TABLET | Freq: Once | ORAL | Status: AC | PRN
Start: 2022-01-02 — End: 2022-01-02
  Administered 2022-01-02: 4 mg via ORAL
  Filled 2022-01-02: qty 1

## 2022-01-02 MED ORDER — SODIUM CHLORIDE 0.9 % IV SOLN: INTRAVENOUS | Status: DC

## 2022-01-02 MED ORDER — SODIUM CHLORIDE 0.9 % IV BOLUS
1000.0000 mL | Freq: Once | INTRAVENOUS | Status: AC
  Administered 2022-01-02: 1000 mL via INTRAVENOUS

## 2022-01-02 NOTE — ED Notes (Signed)
Called [facility] at 1046 for consult for Gen Surg (Dr Jacklynn Bue) per EDP

## 2022-01-02 NOTE — ED Triage Notes (Signed)
Patient here POV from Home.  Endorses Resection with most recent being in Late April with Ileostomy placed. States he began feeling ill 4 Days PTA with Nausea/Emesis. Associated with Generalized ABD Pain.   No Fevers. Mild Dysuria.   NAD Noted during Triage. A&Ox4. GCS 15. BIB Wheelchair.

## 2022-01-02 NOTE — ED Provider Notes (Signed)
[facility] EMERGENCY DEPT Provider Note   CSN: [id number] Arrival date & time: 01/02/22  1951     History {Add pertinent medical, surgical, social history, OB history to HPI:1} Chief Complaint  Patient presents with   Emesis    [name] is a 71 y.o. male.  HPI     Home Medications Prior to Admission medications   Medication Sig Start Date End Date Taking? Authorizing Provider  albuterol (PROVENTIL HFA;VENTOLIN HFA) 108 (90 Base) MCG/ACT inhaler INHALE 2 PUFFS INTO THE LUNGS EVERY 4 (FOUR) HOURS AS NEEDED FOR WHEEZING OR SHORTNESS OF BREATH. Patient not taking: Reported on 11/26/2021 10/24/18   [name], MD  diphenoxylate-atropine (LOMOTIL) 2.5-0.025 MG tablet 1-2 tablets 4 times a day as neded Patient not taking: Reported on 11/26/2021 08/15/21   Gatha Mayer, MD  enoxaparin (LOVENOX) 40 MG/0.4ML injection 40 mg daily. 11/17/21   [provider]  EPINEPHrine (EPIPEN 2-PAK) 0.3 mg/0.3 mL IJ SOAJ injection Inject 0.3 mLs (0.3 mg total) into the muscle as needed for anaphylaxis. 11/09/18   Sherwood Gambler, MD  esomeprazole (NEXIUM) 20 MG capsule Take 20 mg by mouth daily as needed (acid reflux).    [provider]  fluticasone (CUTIVATE) 0.05 % cream Apply 1 application topically 2 (two) times daily as needed for irritation. 11/02/20   [provider]  halobetasol (ULTRAVATE) 0.05 % ointment Apply topically 2 (two) times daily. Patient taking differently: Apply 1 application. topically daily as needed (for psoriasis). 01/08/18   [name], MD  hyoscyamine (LEVSIN SL) 0.125 MG SL tablet PLACE 1 TABLET UNDER THE TONGUE EVERY 4 HOURS AS NEEDED FOR CRAMPING (URGENT DEFECATION) Patient taking differently: Take 0.125 mg by mouth every 4 (four) hours as needed for cramping (for cramping (urgent defecation)). 08/15/21   Gatha Mayer, MD  ketoconazole (NIZORAL) 2 % cream APPLY TWICE A DAY AS NEEDED FOR FUNGAL INFECTION Patient taking  differently: Apply 1 application. topically 2 (two) times daily as needed for irritation. APPLY TWICE A DAY AS NEEDED FOR FUNGAL INFECTION 08/16/20   [name], MD  losartan (COZAAR) 25 MG tablet Take 1 tablet (25 mg total) by mouth daily. Call and schedule follow up visit for further refills. [phone]. 2nd attempt 11/16/21   Josue Hector, MD  Multiple Vitamins-Minerals (MULTIVITAMIN WITH MINERALS) tablet Take 1 tablet by mouth daily.    [provider]  nitrofurantoin, macrocrystal-monohydrate, (MACROBID) 100 MG capsule Take 100 mg by mouth 2 (two) times daily. 11/23/21   [provider]  ondansetron (ZOFRAN-ODT) 4 MG disintegrating tablet 43m ODT q4 hours prn nausea/vomit Patient taking differently: Take 4 mg by mouth every 4 (four) hours as needed for nausea or vomiting. 08/30/21   FDeno Etienne DO  oxyCODONE (OXY IR/ROXICODONE) 5 MG immediate release tablet Take 5 mg by mouth every 6 (six) hours as needed for moderate pain. 11/17/21   [provider]  pantoprazole (PROTONIX) 40 MG tablet Take 1 tablet (40 mg total) by mouth daily. 09/25/21   FCharlynne Cousins MD  pramoxine-hydrocortisone ([facility] 1-1 % rectal cream Place 1 application rectally 2 (two) times daily. Use twice daily before SITZ baths 04/21/19   JMilus Banister MD  tadalafil (CIALIS) 5 MG tablet Take 1 tablet (5 mg total) by mouth daily. Patient not taking: Reported on 11/26/2021 11/23/17   FLaurey Morale MD  traMADol (ULTRAM) 50 MG tablet Take 50 mg by mouth every 6 (six) hours as needed. for breakthrough pain  11/17/21   [provider]  traZODone (DESYREL) 50 MG tablet Take 1 tablet (50 mg total) by mouth at bedtime. 04/12/21   [name], MD  Turmeric 500 MG CAPS Take 500 mg by mouth daily. Patient not taking: Reported on 11/26/2021    [provider]  vedolizumab (ENTYVIO) 300 MG injection INFUSE 300 MG IV every 8 weeks Patient taking differently: Inject 300 mg into the vein  every 28 (twenty-eight) days. Every 4 weeks 08/26/21   Gatha Mayer, MD      Allergies    Mercaptopurine, Shellfish allergy, Humira [adalimumab], and Wound dressing adhesive    Review of Systems   Review of Systems  Physical Exam Updated Vital Signs BP 119/83   Pulse 83   Temp 97.8 F (36.6 C) (Temporal)   Resp 18   Ht 1.778 m (5' 10" )   Wt 81.6 kg   SpO2 98%   BMI 25.81 kg/m  Physical Exam  ED Results / Procedures / Treatments   Labs (all labs ordered are listed, but only abnormal results are displayed) Labs Reviewed  URINALYSIS, ROUTINE W REFLEX MICROSCOPIC    EKG None  Radiology No results found.  Procedures Procedures  {Document cardiac monitor, telemetry assessment procedure when appropriate:1}  Medications Ordered in ED Medications  ondansetron (ZOFRAN-ODT) disintegrating tablet 4 mg (4 mg Oral Given 01/02/22 2004)    ED Course/ Medical Decision Making/ A&P                           Medical Decision Making Amount and/or Complexity of Data Reviewed Labs: ordered.  Risk Prescription drug management.   ***  {Document critical care time when appropriate:1} {Document review of labs and clinical decision tools ie heart score, Chads2Vasc2 etc:1}  {Document your independent review of radiology images, and any outside records:1} {Document your discussion with family members, caretakers, and with consultants:1} {Document social determinants of health affecting pt's care:1} {Document your decision making why or why not admission, treatments were needed:1} Final Clinical Impression(s) / ED Diagnoses Final diagnoses:  None    Rx / DC Orders ED Discharge Orders     None

## 2022-01-02 NOTE — ED Triage Notes (Signed)
Pt arrived via EMS, from home, c/o vomiting and not being able too tolerate POs for several days. Called PCP and told to come to ED for IV fluids.

## 2022-01-02 NOTE — ED Provider Triage Note (Signed)
Emergency Medicine Provider Triage Evaluation Note  JARROD MCENERY , a 71 y.o. male  was evaluated in triage.  Pt complains of nausea and vomiting. Patient with history of Crohns, and recent small bowel resection and ileostomy placement. States that since then he has struggled with nausea and vomiting intermittently. Has been seen several times for same since then. States that this episode has been ongoing for the last week. He called his surgeon [name] and was told to come here for fluids. He states that his ostomy site is draining properly and denies any abdominal pain. Denies any fevers or chills.  Review of Systems  Positive:  Negative:   Physical Exam  BP 116/82 (BP Location: Right Arm)   Pulse 90   Temp 98.4 F (36.9 C) (Oral)   Resp 14   SpO2 98%  Gen:   Awake, no distress   Resp:  Normal effort  MSK:   Moves extremities without difficulty  Other:  Well appearing ostomy site with light brown colored soft stools. Abdomen non-tender  Medical Decision Making  Medically screening exam initiated at 5:36 PM.  Appropriate orders placed.  Marcelino Duster was informed that the remainder of the evaluation will be completed by another provider, this initial triage assessment does not replace that evaluation, and the importance of remaining in the ED until their evaluation is complete.     Bud Face, PA-C 01/02/22 1740

## 2022-01-03 ENCOUNTER — Emergency Department (HOSPITAL_COMMUNITY): Payer: Medicare Other

## 2022-01-03 DIAGNOSIS — N179 Acute kidney failure, unspecified: Secondary | ICD-10-CM | POA: Diagnosis present

## 2022-01-03 DIAGNOSIS — K5000 Crohn's disease of small intestine without complications: Secondary | ICD-10-CM | POA: Diagnosis present

## 2022-01-03 DIAGNOSIS — Z888 Allergy status to other drugs, medicaments and biological substances status: Secondary | ICD-10-CM | POA: Diagnosis not present

## 2022-01-03 DIAGNOSIS — K5090 Crohn's disease, unspecified, without complications: Secondary | ICD-10-CM | POA: Diagnosis present

## 2022-01-03 DIAGNOSIS — R627 Adult failure to thrive: Secondary | ICD-10-CM | POA: Diagnosis present

## 2022-01-03 DIAGNOSIS — R110 Nausea: Secondary | ICD-10-CM | POA: Diagnosis not present

## 2022-01-03 DIAGNOSIS — Z950 Presence of cardiac pacemaker: Secondary | ICD-10-CM | POA: Diagnosis not present

## 2022-01-03 DIAGNOSIS — E860 Dehydration: Secondary | ICD-10-CM | POA: Diagnosis present

## 2022-01-03 DIAGNOSIS — Z79899 Other long term (current) drug therapy: Secondary | ICD-10-CM | POA: Diagnosis not present

## 2022-01-03 DIAGNOSIS — R7401 Elevation of levels of liver transaminase levels: Secondary | ICD-10-CM | POA: Diagnosis present

## 2022-01-03 DIAGNOSIS — J45909 Unspecified asthma, uncomplicated: Secondary | ICD-10-CM | POA: Diagnosis present

## 2022-01-03 DIAGNOSIS — E876 Hypokalemia: Secondary | ICD-10-CM | POA: Diagnosis present

## 2022-01-03 DIAGNOSIS — Z932 Ileostomy status: Secondary | ICD-10-CM

## 2022-01-03 DIAGNOSIS — K219 Gastro-esophageal reflux disease without esophagitis: Secondary | ICD-10-CM

## 2022-01-03 DIAGNOSIS — Z6825 Body mass index (BMI) 25.0-25.9, adult: Secondary | ICD-10-CM | POA: Diagnosis not present

## 2022-01-03 DIAGNOSIS — K50918 Crohn's disease, unspecified, with other complication: Secondary | ICD-10-CM | POA: Diagnosis not present

## 2022-01-03 DIAGNOSIS — E43 Unspecified severe protein-calorie malnutrition: Secondary | ICD-10-CM | POA: Diagnosis present

## 2022-01-03 DIAGNOSIS — Z9049 Acquired absence of other specified parts of digestive tract: Secondary | ICD-10-CM | POA: Diagnosis not present

## 2022-01-03 DIAGNOSIS — Z91013 Allergy to seafood: Secondary | ICD-10-CM | POA: Diagnosis not present

## 2022-01-03 DIAGNOSIS — N400 Enlarged prostate without lower urinary tract symptoms: Secondary | ICD-10-CM | POA: Diagnosis present

## 2022-01-03 DIAGNOSIS — D72829 Elevated white blood cell count, unspecified: Secondary | ICD-10-CM | POA: Diagnosis present

## 2022-01-03 LAB — URINALYSIS, ROUTINE W REFLEX MICROSCOPIC
Bilirubin Urine: NEGATIVE
Glucose, UA: NEGATIVE mg/dL
Hgb urine dipstick: NEGATIVE
Leukocytes,Ua: NEGATIVE
Nitrite: NEGATIVE
Protein, ur: 30 mg/dL — AB
Specific Gravity, Urine: 1.022 (ref 1.005–1.030)
pH: 6.5 (ref 5.0–8.0)

## 2022-01-03 LAB — COMPREHENSIVE METABOLIC PANEL
ALT: 66 U/L — ABNORMAL HIGH (ref 0–44)
AST: 75 U/L — ABNORMAL HIGH (ref 15–41)
Albumin: 3.5 g/dL (ref 3.5–5.0)
Alkaline Phosphatase: 80 U/L (ref 38–126)
Anion gap: 10 (ref 5–15)
BUN: 27 mg/dL — ABNORMAL HIGH (ref 8–23)
CO2: 31 mmol/L (ref 22–32)
Calcium: 9 mg/dL (ref 8.9–10.3)
Chloride: 95 mmol/L — ABNORMAL LOW (ref 98–111)
Creatinine, Ser: 1.84 mg/dL — ABNORMAL HIGH (ref 0.61–1.24)
GFR, Estimated: 39 mL/min — ABNORMAL LOW (ref >60)
Glucose, Bld: 81 mg/dL (ref 70–99)
Potassium: 4.1 mmol/L (ref 3.5–5.1)
Sodium: 136 mmol/L (ref 135–145)
Total Bilirubin: 1.1 mg/dL (ref 0.3–1.2)
Total Protein: 6.8 g/dL (ref 6.5–8.1)

## 2022-01-03 MED ORDER — LOPERAMIDE HCL 2 MG PO CAPS: 2.0000 mg | ORAL_CAPSULE | ORAL | Status: DC | PRN

## 2022-01-03 MED ORDER — DIPHENOXYLATE-ATROPINE 2.5-0.025 MG PO TABS
1.0000 | ORAL_TABLET | Freq: Four times a day (QID) | ORAL | Status: DC | PRN
Start: 2022-01-03 — End: 2022-01-03

## 2022-01-03 MED ORDER — DIPHENOXYLATE-ATROPINE 2.5-0.025 MG/5ML PO LIQD
5.0000 mL | Freq: Four times a day (QID) | ORAL | Status: DC | PRN
  Administered 2022-01-03: 5 mL via ORAL
  Filled 2022-01-03: qty 5

## 2022-01-03 MED ORDER — ENOXAPARIN SODIUM 40 MG/0.4ML IJ SOSY
40.0000 mg | PREFILLED_SYRINGE | INTRAMUSCULAR | Status: DC
  Administered 2022-01-03 – 2022-01-04 (×2): 40 mg via SUBCUTANEOUS
  Filled 2022-01-03 (×3): qty 0.4

## 2022-01-03 MED ORDER — ACETAMINOPHEN 650 MG RE SUPP: 650.0000 mg | Freq: Four times a day (QID) | RECTAL | Status: DC | PRN

## 2022-01-03 MED ORDER — TRAZODONE HCL 50 MG PO TABS
50.0000 mg | ORAL_TABLET | Freq: Every day | ORAL | Status: DC
Start: 2022-01-03 — End: 2022-01-06
  Administered 2022-01-03 – 2022-01-05 (×3): 50 mg via ORAL
  Filled 2022-01-03 (×3): qty 1

## 2022-01-03 MED ORDER — MORPHINE SULFATE (PF) 4 MG/ML IV SOLN
4.0000 mg | INTRAVENOUS | Status: DC | PRN
  Administered 2022-01-03 – 2022-01-05 (×5): 4 mg via INTRAVENOUS
  Filled 2022-01-03 (×5): qty 1

## 2022-01-03 MED ORDER — ENSURE ENLIVE PO LIQD: 237.0000 mL | Freq: Two times a day (BID) | ORAL | Status: DC

## 2022-01-03 MED ORDER — PANTOPRAZOLE SODIUM 40 MG IV SOLR
40.0000 mg | INTRAVENOUS | Status: DC
  Administered 2022-01-03 – 2022-01-04 (×2): 40 mg via INTRAVENOUS
  Filled 2022-01-03 (×2): qty 10

## 2022-01-03 MED ORDER — ALBUTEROL SULFATE (2.5 MG/3ML) 0.083% IN NEBU
2.5000 mg | INHALATION_SOLUTION | Freq: Four times a day (QID) | RESPIRATORY_TRACT | Status: DC | PRN
Start: 2022-01-03 — End: 2022-01-06

## 2022-01-03 MED ORDER — HYOSCYAMINE SULFATE 0.125 MG SL SUBL: 0.1250 mg | SUBLINGUAL_TABLET | SUBLINGUAL | Status: DC | PRN

## 2022-01-03 MED ORDER — LACTATED RINGERS IV SOLN: INTRAVENOUS | Status: DC

## 2022-01-03 MED ORDER — HALOBETASOL PROPIONATE 0.05 % EX OINT: 1.0000 "application " | TOPICAL_OINTMENT | Freq: Every day | CUTANEOUS | Status: DC | PRN

## 2022-01-03 MED ORDER — ONDANSETRON HCL 4 MG/2ML IJ SOLN
4.0000 mg | Freq: Four times a day (QID) | INTRAMUSCULAR | Status: DC | PRN
  Administered 2022-01-03 – 2022-01-06 (×5): 4 mg via INTRAVENOUS
  Filled 2022-01-03 (×5): qty 2

## 2022-01-03 MED ORDER — SODIUM CHLORIDE 0.9% FLUSH
3.0000 mL | Freq: Two times a day (BID) | INTRAVENOUS | Status: DC
  Administered 2022-01-03 – 2022-01-06 (×5): 3 mL via INTRAVENOUS

## 2022-01-03 MED ORDER — LOPERAMIDE HCL 2 MG PO CAPS
4.0000 mg | ORAL_CAPSULE | Freq: Three times a day (TID) | ORAL | Status: DC
  Administered 2022-01-04 – 2022-01-06 (×8): 4 mg via ORAL
  Filled 2022-01-03 (×8): qty 2

## 2022-01-03 MED ORDER — FLUTICASONE PROPIONATE 0.05 % EX CREA
1.0000 | TOPICAL_CREAM | Freq: Two times a day (BID) | CUTANEOUS | Status: DC | PRN
Start: 2022-01-03 — End: 2022-01-03

## 2022-01-03 MED ORDER — ACETAMINOPHEN 325 MG PO TABS: 650.0000 mg | ORAL_TABLET | Freq: Four times a day (QID) | ORAL | Status: DC | PRN

## 2022-01-03 NOTE — ED Notes (Addendum)
Pt reports his colostomy is leaking.  NS made aware and ordering a new kit.  Pt provided w/ wet and dry towels.   This is the 2nd time it has leaked since coming to the hospital.

## 2022-01-03 NOTE — H&P (Addendum)
History and Physical    Patient: [name] YIR:[id number] DOB: Aug 07, 1950 DOA: 01/02/2022 DOS: the patient was seen and examined on 01/03/2022 PCP: [name], MD  Patient coming from: Home  Chief Complaint:  Chief Complaint  Patient presents with   Emesis   HPI: [name] is a 71 y.o. male with medical history significant of Crohn's disease s/p exploratory laparotomy, lysis of adhesions, small bowel resection, mesh explantation, and diverting loop ileostomy creation on 11/11/2021 currently being followed by surgery at [facility] who presented with complaints of nausea and poor p.o. intake.  He had been admitted into the hospital 5/14 for postoperative hematoma, and then again on 5/31 for failure to thrive symptoms. The last few times he has been hospitalized after getting home he gets nauseous and that leads to him being dehydrated as he is not able to take in enough fluids.  He reports that the surgical wound had been healing and states he has not noticed any significant change in discharge from the wound.  He has been placing dry dressings on it twice daily.  Last seen by general surgery at [facility] 6 days ago where his wound was evaluated and noted to be healing appropriately.  Denies having any significant fever or chills.  Patient has had some urinary urgency and discomfort.  He had gone to [facility] yesterday evening due feeling dehydrated, but the wait time was too long and came to  [facility].  Patient does report that he had been possible taking 8 mg of 30 minutes prior to meals.  On admission into the emergency department patient was noted to be afebrile and mildly tachycardic.  Labs since 6/19 significant for WBC 12.5, BUN 30->27, creatinine 2.24-> 1.84, AST 77->75, ALT 76->66.  Urinalysis did not show any significant signs of infection, but did note concern for ketones..  He had been transferred to [facility] for CT scan of the abdomen pelvis which noted  patient to be status post left hemicolectomy and loop ileostomy for treatment of Crohn's disease without any acute abnormality. Patient had been given antiemetics, Protonix IV, and 1 liter normal saline IV fluids before placed on a rate.  Review of Systems: As mentioned in the history of present illness. All other systems reviewed and are negative. Past Medical History:  Diagnosis Date   Acute prostatitis 07/24/2007   Qualifier: Diagnosis of  By: [name] MD, [name]    Allergy    mild   Arthritis    osteoarthritis   Asthma    Blood transfusion without reported diagnosis    BPH (benign prostatic hypertrophy) with urinary obstruction    Crohn's ileitis ([place]) suspected 05/03/2017   Dilated aortic root ([place])    noted on echo 08/2012   Diverticulitis of colon    EPIDIDYMITIS 02/15/2010   Qualifier: Diagnosis of  By: [name] MD, [name]    GERD (gastroesophageal reflux disease)    H/O: GI bleed    Hemorrhoids    Hepatitis 1975   unknown type    HERPES SIMPLEX INFECTION 10/14/2007   Qualifier: Diagnosis of  By: [name] MD, [name]    Hiatal hernia    Ileus following gastrointestinal surgery ([place]) 12/26/2011   Long term (current) use of systemic steroids 06/18/2018   Psoriasis    sees [name] at [facility].   Recurrent ventral incisional hernia 05/10/2012   SVT (supraventricular tachycardia) ([place])    Ulcer 08/21/2013   ileal  Past Surgical History:  Procedure Laterality Date   BOWEL RESECTION  12/19/2011   Procedure: SMALL BOWEL RESECTION;  Surgeon: [name], MD;  Location: [facility];  Service: General;  Laterality: N/A;  with anastamosis and insertion mesh   BRONCHOSCOPY     COLON SURGERY  01/2004   x 2   COLONOSCOPY W/ BIOPSIES  04/26/2017   per [name], no polyps, benign inflammation, repeat in 5 yrs    CYSTOSCOPY     ESOPHAGOGASTRODUODENOSCOPY     HEMICOLECTOMY     left side, at [facility], diverticulitis   [place]     [phone]  incisional hernia   ILEOSTOMY     ILEOSTOMY CLOSURE     INSERTION OF MESH  07/31/2012   Procedure: INSERTION OF MESH;  Surgeon: [name], MD;  Location: [facility];  Service: General;;   LAPAROTOMY  12/19/2011   Procedure: EXPLORATORY LAPAROTOMY;  Surgeon: [name], MD;  Location: [facility];  Service: General;  Laterality: N/A;   PACEMAKER IMPLANT N/A 12/01/2020   Procedure: PACEMAKER IMPLANT;  Surgeon: [name], MD;  Location: [facility];  Service: Cardiovascular;  Laterality: N/A;   PACEMAKER INSERTION Left    TONSILLECTOMY     UPPER GASTROINTESTINAL ENDOSCOPY     VASECTOMY     VENTRAL HERNIA REPAIR  07/31/2012   Procedure: HERNIA REPAIR VENTRAL ADULT;  Surgeon: [name], MD;  Location: [facility];  Service: General;  Laterality: N/A;   Social History:  reports that he has never smoked. He has never used smokeless tobacco. He reports current alcohol use. He reports that he does not use drugs.  Allergies  Allergen Reactions   Mercaptopurine Other (See Comments)    Pancreatitis    Shellfish Allergy Anaphylaxis    Throat started to close   Humira [Adalimumab]     Developed antibodies   Wound Dressing Adhesive Rash    Family History  Problem Relation Age of Onset   Lung cancer Mother    Leukemia Father    Hypertension Father    Heart disease Father    Heart attack Father    Prostate cancer Father    Prostate cancer Paternal Uncle    Colon cancer Neg Hx    Stomach cancer Neg Hx    Colon polyps Neg Hx    Esophageal cancer Neg Hx    Rectal cancer Neg Hx     Prior to Admission medications   Medication Sig Start Date End Date Taking? Authorizing Provider  diphenoxylate-atropine (LOMOTIL) 2.5-0.025 MG tablet 1-2 tablets 4 times a day as neded 08/15/21  Yes Gatha Mayer, MD  losartan (COZAAR) 25 MG tablet Take 1 tablet (25 mg total) by mouth daily. Call and schedule follow up visit for further refills. [phone]. 2nd attempt  11/16/21  Yes Josue Hector, MD  Multiple Vitamins-Minerals (MULTIVITAMIN WITH MINERALS) tablet Take 1 tablet by mouth daily.   Yes [provider]  traZODone (DESYREL) 50 MG tablet Take 1 tablet (50 mg total) by mouth at bedtime. 04/12/21  Yes [name], MD  vedolizumab (ENTYVIO) 300 MG injection INFUSE 300 MG IV every 8 weeks Patient taking differently: Inject 300 mg into the vein every 28 (twenty-eight) days. Every 4 weeks 08/26/21  Yes Gatha Mayer, MD  albuterol (PROVENTIL HFA;VENTOLIN HFA) 108 (90 Base) MCG/ACT inhaler INHALE 2 PUFFS INTO THE LUNGS EVERY 4 (FOUR) HOURS AS NEEDED FOR WHEEZING OR  SHORTNESS OF BREATH. Patient not taking: Reported on 11/26/2021 10/24/18   [name], MD  EPINEPHrine (EPIPEN 2-PAK) 0.3 mg/0.3 mL IJ SOAJ injection Inject 0.3 mLs (0.3 mg total) into the muscle as needed for anaphylaxis. 11/09/18   Sherwood Gambler, MD  esomeprazole (NEXIUM) 20 MG capsule Take 20 mg by mouth daily as needed (acid reflux).    [provider]  fluticasone (CUTIVATE) 0.05 % cream Apply 1 application topically 2 (two) times daily as needed for irritation. 11/02/20   [provider]  halobetasol (ULTRAVATE) 0.05 % ointment Apply topically 2 (two) times daily. Patient taking differently: Apply 1 application  topically daily as needed (for psoriasis). 01/08/18   [name], MD  hyoscyamine (LEVSIN SL) 0.125 MG SL tablet PLACE 1 TABLET UNDER THE TONGUE EVERY 4 HOURS AS NEEDED FOR CRAMPING (URGENT DEFECATION) Patient taking differently: Take 0.125 mg by mouth every 4 (four) hours as needed for cramping (for cramping (urgent defecation)). 08/15/21   Gatha Mayer, MD  ketoconazole (NIZORAL) 2 % cream APPLY TWICE A DAY AS NEEDED FOR FUNGAL INFECTION Patient taking differently: Apply 1 application. topically 2 (two) times daily as needed for irritation. APPLY TWICE A DAY AS NEEDED FOR FUNGAL INFECTION 08/16/20   [name], MD  ondansetron (ZOFRAN-ODT) 4 MG  disintegrating tablet 82m ODT q4 hours prn nausea/vomit Patient taking differently: Take 4 mg by mouth every 4 (four) hours as needed for nausea or vomiting. 08/30/21   FDeno Etienne DO  pantoprazole (PROTONIX) 40 MG tablet Take 1 tablet (40 mg total) by mouth daily. Patient not taking: Reported on 01/03/2022 09/25/21   FCharlynne Cousins MD  pramoxine-hydrocortisone ([facility] 1-1 % rectal cream Place 1 application rectally 2 (two) times daily. Use twice daily before SITZ baths 04/21/19   JMilus Banister MD  tadalafil (CIALIS) 5 MG tablet Take 1 tablet (5 mg total) by mouth daily. 11/23/17   FLaurey Morale MD    Physical Exam: Vitals:   01/03/22 1000 01/03/22 1100 01/03/22 1216 01/03/22 1330  BP: (!) 113/59 127/67 107/75 134/71  Pulse: 63 65 60 67  Resp: 15 16 16 14   Temp:    97.6 F (36.4 C)  TempSrc:    Oral  SpO2: 99% 100% 98% 100%  Weight:      Height:       Exam  Constitutional: Elderly male currently in no acute distress Eyes: PERRL, lids and conjunctivae normal ENMT: Mucous membranes are dry. Posterior pharynx clear of any exudate or lesions. Neck: normal, supple, no masses, no thyromegaly Respiratory: clear to auscultation bilaterally, no wheezing, no crackles. Normal respiratory effort. No accessory muscle use.  Cardiovascular: Regular rate and rhythm, no murmurs / rubs / gallops. No extremity edema.  .  Abdomen: Ileostomy present.  Wound present with serosanguineous drainage present on packing.  Bowel sounds present all 4 quadrants. Musculoskeletal: no clubbing / cyanosis. No joint deformity upper and lower extremities. Good ROM, no contractures. Normal muscle tone.  Skin: no rashes, lesions, ulcers. No induration Neurologic: CN 2-12 grossly intact.  Strength 5/5 in all 4.  Psychiatric: Normal judgment and insight. Alert and oriented x 3. Normal mood.   Data Reviewed:  Ventricularly paced rhythm at 72 bpm Assessment and Plan: Acute kidney injury Patient presented  with creatinine of 2.24 with BUN 30.  Urinalysis noted trace ketones, but no signs of infection.  Creatinine had been within normal limits on 5/13 at 1.08.  Patient had been given IV fluids  with repeat check showing improvement in kidney function with creatinine 1.84 and BUN 27. -Admit to medical telemetry bed -Strict I&Os -Advance diet as tolerated -Antiemetics as needed -Avoid nephrotoxic agents -Continue normal saline IV fluids at 150 mL/h -Recheck kidney function tomorrow morning  Leukocytosis  Chronic.  White blood cell count 12.5 on admission. Patient denies any complaints of fever.  Urinalysis did not show any signs of infection and CT scan of the abdomen pelvis without acute abnormality. Appears his white blood cell count has been elevated over the last 3 months. -Continue to monitor  Crohn's disease s/p ileostomy   Patient is s/p exploratory laparotomy, lysis of adhesions, small bowel resection, mesh explantation, and diverting loop ileostomy creation on 7/34/0370 complicated by anastomotic leak.  Patient on Entyvio in the outpatient setting.  Currently followed by [facility].  CT scan of the abdomen pelvis did not note any acute abnormality. -Ostomy care -Wound care consult -Imodium and Lomotil as needed  BPH No signs of urinary retention noted on CT scan. 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  EKG noting paced rhythm.  Transaminitis Acute.  Labs significant for AST 77->75 and ALT 76->66. -Continue IV fluids and recheck in a.m.  GERD Home medication regimen includes Nexium 20 mg daily as needed. -Pharmacy substitution of Protonix IV  Advance Care Planning:   Code Status: Full Code   Consults: Gastroenterology  Family Communication: None requested  Severity of Illness: The appropriate patient status for this patient is INPATIENT. Inpatient status is judged to be reasonable and necessary in order to  provide the required intensity of service to ensure the patient's safety. The patient's presenting symptoms, physical exam findings, and initial radiographic and laboratory data in the context of their chronic comorbidities is felt to place them at high risk for further clinical deterioration. Furthermore, it is not anticipated that the patient will be medically stable for discharge from the hospital within 2 midnights of admission.   * I certify that at the point of admission it is my clinical judgment that the patient will require inpatient hospital care spanning beyond 2 midnights from the point of admission due to high intensity of service, high risk for further deterioration and high frequency of surveillance required.*  Author: Norval Morton, MD 01/03/2022 2:57 PM  For on call review [email or web address].

## 2022-01-03 NOTE — ED Notes (Signed)
Pt given juices x4 by Hospitalist.

## 2022-01-03 NOTE — ED Provider Notes (Signed)
71 year old male arrived by transfer from [facility] for CT of the abdomen.  Patient has a history of Crohn's disease with recent ileostomy placed at [facility].  Patient reports recent nausea and decreased p.o. intake he was found to have AKI yesterday, prior providers had consulted with [facility] they were unable to transfer due to volume and plan was to possibly admit for AKI here.  Patient was started on IV fluids to treat AKI. Physical Exam  BP 134/71 (BP Location: Left Arm)   Pulse 67   Temp 97.6 F (36.4 C) (Oral)   Resp 14   Ht 5' 10"  (1.778 m)   Wt 81.6 kg   SpO2 100%   BMI 25.81 kg/m   Physical Exam Constitutional:      General: He is not in acute distress.    Appearance: Normal appearance. He is well-developed. He is not ill-appearing or diaphoretic.  HENT:     Head: Normocephalic and atraumatic.  Eyes:     General: Vision grossly intact. Gaze aligned appropriately.     Pupils: Pupils are equal, round, and reactive to light.  Neck:     Trachea: Trachea and phonation normal.  Pulmonary:     Effort: Pulmonary effort is normal. No respiratory distress.  Abdominal:     General: There is no distension.     Palpations: Abdomen is soft.     Tenderness: There is no abdominal tenderness. There is no guarding or rebound.     Comments: Ileostomy present with light brown stool.  Midline surgical wound with packing in place.  Musculoskeletal:        General: Normal range of motion.     Cervical back: Normal range of motion.  Skin:    General: Skin is warm and dry.  Neurological:     Mental Status: He is alert.     GCS: GCS eye subscore is 4. GCS verbal subscore is 5. GCS motor subscore is 6.     Comments: Speech is clear and goal oriented, follows commands Major Cranial nerves without deficit, no facial droop Moves extremities without ataxia, coordination intact  Psychiatric:        Behavior: Behavior normal.     Procedures  Procedures  ED Course /  MDM    Medical Decision Making Amount and/or Complexity of Data Reviewed Labs: ordered.  Risk Prescription drug management. Decision regarding hospitalization.   CT AP: IMPRESSION:  Status post left hemicolectomy and loop ileostomy for treatment of  Crohn's disease.    No acute abnormality is noted.    Stable mild prostatic enlargement.      Electronically Signed    By: Marijo Conception M.D.    On: 01/03/2022 14:21     2:43 PM: I have asked her secondary to page colorectal surgery at [facility] to discuss CT results.  I have also placed consult to medicine team for admission and treatment of AKI.  Discussed case with Dr. Tamala Julian, patient excepted for admission.  Awaiting callback from [facility]. ----------------------- Did not hear back from [facility], Dr. Tamala Julian updated.  Care taken over by hospitalist team.  Note: Portions of this report may have been transcribed using voice recognition software. Every effort was made to ensure accuracy; however, inadvertent computerized transcription errors may still be present.      Deliah Boston, PA-C 01/03/22 1611    Sherwood Gambler, MD 01/04/22 [phone]

## 2022-01-03 NOTE — ED Notes (Signed)
CL called for transport to [place], Dr Armandina Gemma

## 2022-01-03 NOTE — ED Notes (Signed)
Wet to dry dressing applied to wound noted on abd

## 2022-01-03 NOTE — ED Notes (Signed)
Patient made aware of delay in getting bed at [place] called and spoke to bed placement at [facility] and told that we may not get a bed today.  Director made aware of delay.  Patient requested if he is not going to get a bed at [facility] if he could be admitted to [facility], but patient was a surgical patient at [facility] and less likely to be admitted at [facility].

## 2022-01-03 NOTE — ED Notes (Signed)
Discussed concerns for pending transfer with EDP [place]. This facility currently does not have a CT scanner. Pt will be pending an bed assignment with [facility]. Per [facility] bed to be assigned earliest tomorrow morning with the latest that afternoon. Pt will be at our facility for an extensive amount of hours. Per EDP pt will be able to stay here with no acute concerns to expedite CT scan. Pt to await bed assignment at this facility.

## 2022-01-03 NOTE — ED Notes (Signed)
Report received from [place].  Pt sent from [facility] ED for CT scan and/or possible admission for AKI.

## 2022-01-03 NOTE — ED Provider Notes (Signed)
Patient with multiple medical problems including Crohn's disease with recurrent surgeries most recent surgery at [facility] at the end of April.  Patient presenting with poor oral intake and new AKI.  Patient has not been able to get a CAT scan due to the scanner being broken.  [facility] still does not have any beds and will not accept an ED to ED transfer until there is a bed available.  At this time feel that it would be helpful to have a CT scan of the patient to see if there is an acute surgical complication as there has been more drainage from his wound if the patient may need antibiotics or if there is no evidence of complication and patient could be admitted locally for AKI.  He has been getting IV fluids and reports overall he is feeling better.  Spoke with Dr. Armandina Gemma who has a agreed to accept the patient at [facility].  Once CT scan returns may be helpful to discuss with the surgical team at [facility] whether he needs admission there or can stay local.   Blanchie Dessert, MD 01/03/22 1035

## 2022-01-03 NOTE — ED Notes (Signed)
Hospitalist at bedside 

## 2022-01-04 DIAGNOSIS — K219 Gastro-esophageal reflux disease without esophagitis: Secondary | ICD-10-CM | POA: Diagnosis not present

## 2022-01-04 DIAGNOSIS — Z932 Ileostomy status: Secondary | ICD-10-CM | POA: Diagnosis not present

## 2022-01-04 DIAGNOSIS — N179 Acute kidney failure, unspecified: Secondary | ICD-10-CM | POA: Diagnosis not present

## 2022-01-04 DIAGNOSIS — K50918 Crohn's disease, unspecified, with other complication: Secondary | ICD-10-CM | POA: Diagnosis not present

## 2022-01-04 LAB — BASIC METABOLIC PANEL
Anion gap: 10 (ref 5–15)
BUN: 18 mg/dL (ref 8–23)
CO2: 26 mmol/L (ref 22–32)
Calcium: 8.4 mg/dL — ABNORMAL LOW (ref 8.9–10.3)
Chloride: 103 mmol/L (ref 98–111)
Creatinine, Ser: 1.46 mg/dL — ABNORMAL HIGH (ref 0.61–1.24)
GFR, Estimated: 52 mL/min — ABNORMAL LOW (ref >60)
Glucose, Bld: 76 mg/dL (ref 70–99)
Potassium: 3.8 mmol/L (ref 3.5–5.1)
Sodium: 139 mmol/L (ref 135–145)

## 2022-01-04 LAB — CBC
HCT: 40.1 % (ref 39.0–52.0)
Hemoglobin: 12.5 g/dL — ABNORMAL LOW (ref 13.0–17.0)
MCH: 26.4 pg (ref 26.0–34.0)
MCHC: 31.2 g/dL (ref 30.0–36.0)
MCV: 84.6 fL (ref 80.0–100.0)
Platelets: 150 10*3/uL (ref 150–400)
RBC: 4.74 MIL/uL (ref 4.22–5.81)
RDW: 16.1 % — ABNORMAL HIGH (ref 11.5–15.5)
WBC: 6.5 10*3/uL (ref 4.0–10.5)
nRBC: 0 % (ref 0.0–0.2)

## 2022-01-04 LAB — PHOSPHORUS: Phosphorus: 3.2 mg/dL (ref 2.5–4.6)

## 2022-01-04 LAB — MAGNESIUM: Magnesium: 1.9 mg/dL (ref 1.7–2.4)

## 2022-01-04 MED ORDER — DIPHENOXYLATE-ATROPINE 2.5-0.025 MG PO TABS
1.0000 | ORAL_TABLET | Freq: Four times a day (QID) | ORAL | Status: DC | PRN
  Administered 2022-01-06: 1 via ORAL
  Filled 2022-01-04 (×2): qty 1

## 2022-01-04 MED ORDER — FLUTICASONE PROPIONATE 0.05 % EX CREA
1.0000 | TOPICAL_CREAM | Freq: Two times a day (BID) | CUTANEOUS | Status: DC | PRN
Start: 2022-01-04 — End: 2022-01-04

## 2022-01-04 MED ORDER — HALOBETASOL PROPIONATE 0.05 % EX OINT: 1.0000 "application " | TOPICAL_OINTMENT | Freq: Every day | CUTANEOUS | Status: DC | PRN

## 2022-01-04 MED ORDER — PANTOPRAZOLE SODIUM 40 MG PO TBEC
40.0000 mg | DELAYED_RELEASE_TABLET | Freq: Every day | ORAL | Status: DC
  Administered 2022-01-05 – 2022-01-06 (×2): 40 mg via ORAL
  Filled 2022-01-04 (×2): qty 1

## 2022-01-04 NOTE — Progress Notes (Signed)
Remote pacemaker transmission.   

## 2022-01-04 NOTE — Consult Note (Signed)
[place] Nurse ostomy consult note Stoma type/location: RUQ, ileostomy; performed at [facility] April 2023; had ostomy previously in 2018 as well.  Stomal assessment/size: slightly smaller than 1", budded, pink, moist Peristomal assessment: circumferentially denuded proximity 1cm  Treatment options for stomal/peristomal skin: crusted peristomal skin with ostomy powder and used skin barrier wipe  Output liquid green, high output Ostomy pouching: 1pc.flex convex with 2" skin barrier ring, needs MEDIUM BELT, non in house for use  Education provided:  Discussed need for belt and rationale for use, patient has dips at 3 and 9 o'clock and he is very thin; pouch leaking at 9 o'clock  Enrolled patient in [facility] program: Yes, previously at outside hospital   [place] Nurse wound follow up Wound type: midline, non healing surgical wound; s/p mesh removal Patient self reports it has been draining since surgery; has had multiple CT scans which do not show fistula or communication within the abdomen and the wound Measurement: 1.5cm x 0.3cm x 1cm x 3cm tunnel at 11 o'clock   Wound bed: unable able to visualize  Drainage (amount, consistency, odor) yellow, thick, with no odor. Patient self reports unchanged since DC from hospital  Periwound: intact; well heal surgical incision other than current above mentioned opening  Dressing procedure/placement/frequency: Silver hydrofiber wick into tunneled area, top with dry dressing, secure with tape.    [facility] Nurse will follow along with you for continued support with ostomy teaching and care [place] MSN, RN, [place], [place], [place]

## 2022-01-04 NOTE — Consult Note (Signed)
[facility] Consult: 11:45 AM 01/04/2022  LOS: 1 day    Referring Provider: Dr Cline Cools  Primary Care Physician:  [name], MD Primary Gastroenterologist:  [name]     Reason for Consultation: Nausea, failure to thrive.  Crohn's disease.   HPI: [name] is a 71 y.o. male.  PMH ileal Crohn's disease.  Previous small bowel resection (for SBO), colon resection and hernia surgeries with mesh.  Underwent ileostomy in 2005 that was reversed 9 months later.  Psoriasis.  Current management with [facility] but previous treatment with other Biologics in past years.  Latest colonoscopy 09/20/2021.  At [facility]. For Crohn's follow-up.  Noted ileitis w a few, small, subtle superficial ulcers, biopsies.   Right colon diverticulosis.  Study improved compared with 2018 colonoscopy.  Path: Ileal mucosa, no specific histopathologic changes.  No acute inflammation, features of chronicity or granulomas Subsequently hospitalized 3/7 - 3/11 with Crohn's flare.  09/20/2021 CTAP showed changes of prior bowel resection, chronic fluid distention at a segment of small bowel in right anterior abdomen at region of anastomosis.  No bowel obstruction.  Focal wall thickening, mucosal enhancement at distal small bowel in RLQ with small adjacent fluid and soft tissue stranding/inflammation suspicious for active enteritis.  Treated with antibiotics, steroids and discharged on steroid taper.  Entyvio infusions changed to monthly from every 2 months.   At last office visit with [name] on 3/29 patient reported overall improvement but persistent RLQ pain present for months.  MD recommendation was to continue the steroid taper and monthly Entyvio.  Plan referral to [name] for surgical opinion.   On 11/11/2021 patient underwent  ex lap, small  bowel resection, enterotomy repair, mesh removal, diverting loop ileostomy.  CTAP w contrast 11/26/2021 for abdominal pain  This showed changes consistent with recent bowel surgery including air and fluid at subcu level and tiny focus of free air in abdomen.  The loop ileostomy was noted.  Multiple fluid collections scattered throughout the abdomen which demonstrate slight increased attenuation, attributed to postoperative hematomas.  These do not contain air but infected hematoma not ruled out. HGb 10.6, MCV 92.  WBCs 18.3.  Lactic acid normal.  T. bili 1.5, albumin 2.9 but otherwise normal LFTs.  Patient has been plagued by nausea but not much vomiting since going home from the surgery.  Zofran minimally effective.  As a consequence his p.o. intake has declined.  Overall dropped 22 pounds since surgery and 8 pounds alone in the last week.  Has had good output to the ostomy with watery, nonbloody stools.  Has been prescribed Imodium by his surgeon during the day and Lomotil at night which results in a more liquid, softer but unformed stool.   Office visit last Wednesday with the surgical PA at [facility].  They felt things were doing well.  He is set up to see [name] in August to discuss reversing the ileostomy. Patient has not started back on Entyvio and his last dose was in March.  Because of the ongoing nausea, decreased  p.o. intake, he became oliguric and dehydrated.  Advised to go to the local ER to get fluids by [name].  Went to draw [facility] and then was admitted to [facility]. Initial labs with AKI, BUNs/creatinine 30/2.2, these are improving.  GFR 31..  52.  Sodium also low 133 but now normalized.  T. Bili 1.3, normal alk phos.  AST/ALT 77/76. WBCs 12.5..  6.5.  Hb 12.5.  Platelets 150.  Lactic acid normal.  01/03/2022 CTAP without contrast showed prior left hemicolectomy, loop ileostomy, no acute abnormalities, no abscesses or significant fluid collections.  Some likely postsurgical  stranding, scarring in the anterior pelvis.  Stable prostamegaly.  At home patient was not taking any gastric acid suppressing medication and now that he has been started on IV Protonix, his nausea has improved and his appetite is returning.  Patient retired.  Lives at home with his wife.  No recent alcohol.  Not a smoker. Mother had lung cancer.  Father had leukemia, prostate cancer, MI, CAD, hypertension.      Past Medical History:  Diagnosis Date   Acute prostatitis 07/24/2007   Qualifier: Diagnosis of  By: [name] MD, [name]    Allergy    mild   Arthritis    osteoarthritis   Asthma    Blood transfusion without reported diagnosis    BPH (benign prostatic hypertrophy) with urinary obstruction    Crohn's ileitis ([place]) suspected 05/03/2017   Dilated aortic root ([place])    noted on echo 08/2012   Diverticulitis of colon    EPIDIDYMITIS 02/15/2010   Qualifier: Diagnosis of  By: [name] MD, [name]    GERD (gastroesophageal reflux disease)    H/O: GI bleed    Hemorrhoids    Hepatitis 1975   unknown type    HERPES SIMPLEX INFECTION 10/14/2007   Qualifier: Diagnosis of  By: [name] MD, [name]    Hiatal hernia    Ileus following gastrointestinal surgery ([place]) 12/26/2011   Long term (current) use of systemic steroids 06/18/2018   Psoriasis    sees [name] at [facility].   Recurrent ventral incisional hernia 05/10/2012   SVT (supraventricular tachycardia) ([place])    Ulcer 08/21/2013   ileal    Past Surgical History:  Procedure Laterality Date   BOWEL RESECTION  12/19/2011   Procedure: SMALL BOWEL RESECTION;  Surgeon: [name], MD;  Location: [facility];  Service: General;  Laterality: N/A;  with anastamosis and insertion mesh   BRONCHOSCOPY     COLON SURGERY  01/2004   x 2   COLONOSCOPY W/ BIOPSIES  04/26/2017   per [name], no polyps, benign inflammation, repeat in 5 yrs    CYSTOSCOPY     ESOPHAGOGASTRODUODENOSCOPY     HEMICOLECTOMY     left side, at [facility],  diverticulitis   [place]     [phone] incisional hernia   ILEOSTOMY     ILEOSTOMY CLOSURE     INSERTION OF MESH  07/31/2012   Procedure: INSERTION OF MESH;  Surgeon: [name], MD;  Location: [facility];  Service: General;;   LAPAROTOMY  12/19/2011   Procedure: EXPLORATORY LAPAROTOMY;  Surgeon: [name], MD;  Location: [facility];  Service: General;  Laterality: N/A;   PACEMAKER IMPLANT N/A 12/01/2020   Procedure: PACEMAKER IMPLANT;  Surgeon: [name], MD;  Location: [place] [facility];  Service: Cardiovascular;  Laterality: N/A;   PACEMAKER INSERTION Left  TONSILLECTOMY     UPPER GASTROINTESTINAL ENDOSCOPY     VASECTOMY     VENTRAL HERNIA REPAIR  07/31/2012   Procedure: HERNIA REPAIR VENTRAL ADULT;  Surgeon: [name], MD;  Location: [facility];  Service: General;  Laterality: N/A;    Prior to Admission medications   Medication Sig Start Date End Date Taking? Authorizing Provider  diphenoxylate-atropine (LOMOTIL) 2.5-0.025 MG tablet 1-2 tablets 4 times a day as neded 08/15/21  Yes Gatha Mayer, MD  losartan (COZAAR) 25 MG tablet Take 1 tablet (25 mg total) by mouth daily. Call and schedule follow up visit for further refills. [phone]. 2nd attempt 11/16/21  Yes Josue Hector, MD  Multiple Vitamins-Minerals (MULTIVITAMIN WITH MINERALS) tablet Take 1 tablet by mouth daily.   Yes [provider]  traZODone (DESYREL) 50 MG tablet Take 1 tablet (50 mg total) by mouth at bedtime. 04/12/21  Yes [name], MD  vedolizumab (ENTYVIO) 300 MG injection INFUSE 300 MG IV every 8 weeks Patient taking differently: Inject 300 mg into the vein every 28 (twenty-eight) days. Every 4 weeks 08/26/21  Yes Gatha Mayer, MD  albuterol (PROVENTIL HFA;VENTOLIN HFA) 108 (90 Base) MCG/ACT inhaler INHALE 2 PUFFS INTO THE LUNGS EVERY 4 (FOUR) HOURS AS NEEDED FOR WHEEZING OR SHORTNESS OF BREATH. Patient not taking: Reported on 11/26/2021  10/24/18   [name], MD  EPINEPHrine (EPIPEN 2-PAK) 0.3 mg/0.3 mL IJ SOAJ injection Inject 0.3 mLs (0.3 mg total) into the muscle as needed for anaphylaxis. 11/09/18   Sherwood Gambler, MD  esomeprazole (NEXIUM) 20 MG capsule Take 20 mg by mouth daily as needed (acid reflux).    [provider]  fluticasone (CUTIVATE) 0.05 % cream Apply 1 application topically 2 (two) times daily as needed for irritation. 11/02/20   [provider]  halobetasol (ULTRAVATE) 0.05 % ointment Apply topically 2 (two) times daily. Patient taking differently: Apply 1 application  topically daily as needed (for psoriasis). 01/08/18   [name], MD  hyoscyamine (LEVSIN SL) 0.125 MG SL tablet PLACE 1 TABLET UNDER THE TONGUE EVERY 4 HOURS AS NEEDED FOR CRAMPING (URGENT DEFECATION) Patient taking differently: Take 0.125 mg by mouth every 4 (four) hours as needed for cramping (for cramping (urgent defecation)). 08/15/21   Gatha Mayer, MD  ketoconazole (NIZORAL) 2 % cream APPLY TWICE A DAY AS NEEDED FOR FUNGAL INFECTION Patient taking differently: Apply 1 application. topically 2 (two) times daily as needed for irritation. APPLY TWICE A DAY AS NEEDED FOR FUNGAL INFECTION 08/16/20   [name], MD  ondansetron (ZOFRAN-ODT) 4 MG disintegrating tablet 70m ODT q4 hours prn nausea/vomit Patient taking differently: Take 4 mg by mouth every 4 (four) hours as needed for nausea or vomiting. 08/30/21   FDeno Etienne DO  pramoxine-hydrocortisone ([facility] 1-1 % rectal cream Place 1 application rectally 2 (two) times daily. Use twice daily before SITZ baths 04/21/19   JMilus Banister MD  tadalafil (CIALIS) 5 MG tablet Take 1 tablet (5 mg total) by mouth daily. 11/23/17   FLaurey Morale MD    Scheduled Meds:  enoxaparin (LOVENOX) injection  40 mg Subcutaneous Q24H   feeding supplement  237 mL Oral BID BM   loperamide  4 mg Oral TID AC   pantoprazole (PROTONIX) IV  40 mg Intravenous Q24H   sodium chloride  flush  3 mL Intravenous Q12H   traZODone  50 mg Oral QHS   Infusions:  sodium chloride 150 mL/hr at 01/04/22  0621   PRN Meds: acetaminophen **OR** acetaminophen, albuterol, diphenoxylate-atropine, fluticasone, halobetasol, hyoscyamine, loperamide, morphine injection, ondansetron (ZOFRAN) IV   Allergies as of 01/02/2022 - Review Complete 01/02/2022  Allergen Reaction Noted   Mercaptopurine Other (See Comments) 10/08/2017   Shellfish allergy Anaphylaxis 12/12/2011   Humira [adalimumab]  03/20/2019   Wound dressing adhesive Rash 12/30/2020    Family History  Problem Relation Age of Onset   Lung cancer Mother    Leukemia Father    Hypertension Father    Heart disease Father    Heart attack Father    Prostate cancer Father    Prostate cancer Paternal Uncle    Colon cancer Neg Hx    Stomach cancer Neg Hx    Colon polyps Neg Hx    Esophageal cancer Neg Hx    Rectal cancer Neg Hx     Social History   Socioeconomic History   Marital status: Married    Spouse name: Not on file   Number of children: 2   Years of education: Not on file   Highest education level: Not on file  Occupational History   Occupation: EHS Freight forwarder    Employer: [employer]  Tobacco Use   Smoking status: Never   Smokeless tobacco: Never  Vaping Use   Vaping Use: Never used  Substance and Sexual Activity   Alcohol use: Yes    Alcohol/week: 0.0 standard drinks of alcohol    Comment: occ   Drug use: No   Sexual activity: Not on file  Other Topics Concern   Not on file  Social History Narrative   He is married with 2 sons 1 son is an Art gallery manager and 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  Better overnight. ENT:  No nose bleeds Pulm: No shortness of breath or cough. CV:  No palpitations, no LE edema.  No chest pain GU: Oliguria PTA, urine output much improved overnight in setting of IV fluids.  No hematuria, no frequency GI: See HPI. Heme: No unusual bleeding or bruising Transfusions: None. Neuro:  No headaches, no peripheral tingling or numbness.  No syncope, no seizures.  No visual changes, blurry vision etc. MS: No significant arthritis or low back pain Derm:  No itching, no rash or sores.  Reports psoriasis improved a lot since starting Entyvio and now just has a patch behind his left ear Endocrine:  No sweats or chills.  No polyuria or dysuria Immunization: Reviewed Travel:  None beyond local counties in last few months.    PHYSICAL EXAM: Vital signs in last 24 hours: Vitals:   01/04/22 0535 01/04/22 0745  BP: 131/64 127/68  Pulse: (!) 59 66  Resp: 16 16  Temp: (!) 97.5 F (36.4 C) 98.3 F (36.8 C)  SpO2: 100% 100%   Wt Readings from Last 3 Encounters:  01/02/22 81.6 kg  11/26/21 81.6 kg  10/12/21 90.7 kg  General: Pleasant.  Looks well.  No distress.  Comfortable. Head: No facial asymmetry or swelling.  No signs of head trauma. Eyes: No conjunctival pallor. Ears: No hearing deficit Nose: No congestion or discharge Mouth: Moist, clear, pink oral mucosa.  Good dentition.  Tongue midline Neck: No JVD, no masses, no thyromegaly Lungs: Good breath sounds, no labored breathing or cough. Heart: RRR.  No MRG.  S1, S2 present Abdomen: Soft.  Not tender.  Watery light brown to yellowish colored stool in ostomy bag on right abdomen.  No blood seen in the ostomy bag no distention.  Active bowel sounds.  No HSM,  bruits, hernias Rectal: Deferred Musc/Skeltl: No joint redness, swelling or gross deformity.  Muscle wasting Extremities: No CCE.   Neurologic: Alert.  Oriented x3.  Moves all 4 limbs.  No weakness or tremors. Skin: No obvious sores or suspicious lesions.  Slight psoriatic plaque behind left ear. Nodes: No cervical adenopathy Psych: Calm, pleasant, cooperative.  Fluid speech.  Good spirits.  Intake/Output from previous day: 06/20 0701 - 06/21 0700 In: 3089.6 [I.V.:3089.6] Out: 450 [Stool:450] Intake/Output this shift: Total I/O In: -  Out: 300 [Urine:300]  LAB RESULTS: Recent Labs    01/02/22 1812 01/04/22 0754  WBC 12.5* 6.5  HGB 14.9 12.5*  HCT 47.8 40.1  PLT 261 150   BMET Lab Results  Component Value Date   NA 139 01/04/2022   NA 136 01/03/2022   NA 133 (L) 01/02/2022   K 3.8 01/04/2022   K 4.1 01/03/2022   K 4.4 01/02/2022   CL 103 01/04/2022   CL 95 (L) 01/03/2022   CL 91 (L) 01/02/2022   CO2 26 01/04/2022   CO2 31 01/03/2022   CO2 29 01/02/2022   GLUCOSE 76 01/04/2022   GLUCOSE 81 01/03/2022   GLUCOSE 109 (H) 01/02/2022   BUN 18 01/04/2022   BUN 27 (H) 01/03/2022   BUN 30 (H) 01/02/2022   CREATININE 1.46 (H) 01/04/2022   CREATININE 1.84 (H) 01/03/2022   CREATININE 2.24 (H) 01/02/2022   CALCIUM 8.4 (L) 01/04/2022   CALCIUM 9.0 01/03/2022   CALCIUM 9.5 01/02/2022   LFT Recent Labs    01/02/22 1812 01/03/22 1049  PROT 8.3* 6.8  ALBUMIN 4.1 3.5  AST 77* 75*  ALT 76* 66*  ALKPHOS 120 80  BILITOT 1.3* 1.1   PT/INR Lab Results  Component Value Date   INR 1.1 09/20/2021   INR 1.13 12/19/2011   INR 1.0 09/04/2008   Hepatitis Panel No results for input(s): "HEPBSAG", "HCVAB", "HEPAIGM", "HEPBIGM" in the last 72 hours. C-Diff No components found for: "CDIFF" Lipase     Component Value Date/Time   LIPASE 44 01/02/2022 1812    Drugs of Abuse     Component Value Date/Time   LABOPIA NONE DETECTED 09/04/2008 2347   COCAINSCRNUR NONE  DETECTED 09/04/2008 2347   LABBENZ NONE DETECTED 09/04/2008 2347   AMPHETMU NONE DETECTED 09/04/2008 2347   THCU NONE DETECTED 09/04/2008 2347   LABBARB  09/04/2008 2347    NONE DETECTED        DRUG SCREEN FOR MEDICAL PURPOSES ONLY.  IF CONFIRMATION IS NEEDED FOR ANY PURPOSE, NOTIFY LAB WITHIN 5 DAYS.        LOWEST DETECTABLE LIMITS FOR URINE DRUG SCREEN Drug Class       Cutoff (ng/mL) Amphetamine      1000 Barbiturate      200 Benzodiazepine   676 Tricyclics       720 Opiates  300 Cocaine          300 THC              50     RADIOLOGY STUDIES: CT ABDOMEN PELVIS WO CONTRAST  Result Date: 01/03/2022 CLINICAL DATA:  Abdominal pain, draining wound, history of Crohn's disease. EXAM: CT ABDOMEN AND PELVIS WITHOUT CONTRAST TECHNIQUE: Multidetector CT imaging of the abdomen and pelvis was performed following the standard protocol without IV contrast. RADIATION DOSE REDUCTION: This exam was performed according to the departmental dose-optimization program which includes automated exposure control, adjustment of the mA and/or kV according to patient size and/or use of iterative reconstruction technique. COMPARISON:  Nov 26, 2021. FINDINGS: Lower chest: No acute abnormality. Hepatobiliary: No focal liver abnormality is seen. No gallstones, gallbladder wall thickening, or biliary dilatation. Pancreas: Unremarkable. No pancreatic ductal dilatation or surrounding inflammatory changes. Spleen: Normal in size without focal abnormality. Adrenals/Urinary Tract: Adrenal glands are unremarkable. Kidneys are normal, without renal calculi, focal lesion, or hydronephrosis. Bladder is unremarkable. Stomach/Bowel: The stomach is unremarkable. Status post left hemicolectomy. Loop ileostomy is noted in right lower quadrant. Sigmoid surgical anastomosis is noted. No abnormal bowel dilatation or inflammation is noted. Vascular/Lymphatic: No significant vascular findings are present. No enlarged abdominal or  pelvic lymph nodes. Reproductive: Stable mild prostatic enlargement is noted. Other: No definite abscess or significant fluid collection is noted. Probable postsurgical stranding or scarring is noted anteriorly in the pelvis. No definite hernia is noted. Musculoskeletal: No acute or significant osseous findings. IMPRESSION: Status post left hemicolectomy and loop ileostomy for treatment of Crohn's disease. No acute abnormality is noted. Stable mild prostatic enlargement. Electronically Signed   By: Marijo Conception M.D.   On: 01/03/2022 14:21      IMPRESSION:   Protracted nausea but not much vomiting since surgery.  No evidence for active Crohn's disease or obstruction on yesterday's CT scan.  Symptoms have improved since receiving 2 doses IV Protonix.  Was not on acid suppressing medication at home.  Longstanding history of Crohn's ileitis.  Previous small bowel surgeries, previous remote ileostomy that was later reversed and colon resections. Prior hernia repair with mesh.  Most recent surgery was exploratory lap, small bowel resection, repair of enterotomy, mesh removal and diverting loop ileostomy on 11/11/2021 by [name] at [place] generally watery but with use of antidiarrheals they are more of a soft loose character.  Has not started back on Entyvio and last dose was in March.  AKI, dehydration, improved with IV fluids.  Mild elevation T. bili, transaminases.  Liver, gallbladder normal on CT    PLAN:     Switch to po Protonix 40 mg daily.  Continue AC Imodium and prn Lomotil.  Agree with regular diet.  Determine timing for restarting Entyvio.    Patient does not have scheduled follow-up with [name].  Patient has been in touch with him via MyChart.  Plan was to have him come back to GI office following the follow-up with [name] in August   Azucena Freed  01/04/2022, 11:45 AM Phone [phone]

## 2022-01-04 NOTE — Progress Notes (Signed)
PROGRESS NOTE    [name]  UYQ:[id number] DOB: 1950/10/19 DOA: 01/02/2022 PCP: [name], MD    Chief Complaint  Patient presents with   Emesis    Brief Narrative:  Patient 71 year old gentleman history of Crohn's disease status post expiratory laparotomy, lysis of adhesions, small bowel resection, mesh explantation and diverting loop ileostomy creation on 11/11/2021 being followed by general surgery at [facility] presented with complaints of nausea poor oral intake.  Patient noted to have been admitted to the hospital 514 for postop hematoma and then again on 531 for failure to thrive symptoms.  Patient stated last few times he has been hospitalized has been getting home feeling nauseous leading to poor oral intake and being dehydrated.  Notes surgical wound has been healing and no significant change in discharge from the wound.  Patient on presentation in the ED noted to be in acute kidney injury likely due to prerenal azotemia from dehydration and placed on IV fluids.   Assessment & Plan:   Principal Problem:   AKI (acute kidney injury) ([place]) Active Problems:   Leukocytosis   Crohn's disease ([place])   GERD   Transaminitis   Ileostomy in place [facility])   Pacemaker  #1 acute kidney injury -Patient on presentation to present with a creatinine of 2.24, BUN of 30. -Urinalysis with some ketones however no signs of infection. -Patient creatinine noted to be normal 5/13 at 1.08. -Renal function improving with hydration. -Continue antiemetics, avoid nephrotoxic agents, IV fluids. -Repeat labs in the AM.  2.  Leukocytosis -Chronic -Patient noted to have a leukocytosis for the past 3 months. -Patient currently afebrile. -Urinalysis with no signs of infection. -CT abdomen and pelvis with any acute abnormalities. -Currently afebrile. -No need for antibiotics.  3.  Crohn's disease status post ileostomy -Patient status post expiratory laparotomy, lysis of  adhesions, small bowel resection, mesh explantation and diverting loop ileostomy creation on 6/38/7564 complicated by anastomotic leak. -Patient noted to be on Entyvio in the outpatient setting being followed at [facility]. -CT abdomen pelvis with no acute abnormality. -Ostomy care -Wound care consulted. -Imodium and Lomotil as needed.  4.  BPH -Stable.  5.  Status post PPM -Patient noted to have reported PPM placed for bradycardia. -EKG with paced rhythm.  6.  Transaminitis -CT abdomen and pelvis with no acute abnormalities. -Continue IV fluids. -Repeat labs in AM.  7.  GERD -PPI.    DVT prophylaxis: Lovenox Code Status: Full Family Communication: Updated patient.  No family at bedside Disposition: Likely home in 24 hours if continued improvement  Status is: Inpatient Remains inpatient appropriate because: Severity of illness.   Consultants:  Gastroenterology  Procedures:  CT abdomen and pelvis 01/11/2022   Antimicrobials:  None   Subjective: Patient states overall starting to feel better.  No chest pain.  No shortness of breath.  States was able to tolerate an omelette this morning.  Some urine output.  Asking why he continues to have some nausea intermittently leading to his dehydration.  Objective: Vitals:   01/04/22 0029 01/04/22 0535 01/04/22 0745 01/04/22 1544  BP: 118/64 131/64 127/68 (!) 98/54  Pulse: 65 (!) 59 66 71  Resp: 17 16 16 16   Temp: 98.4 F (36.9 C) (!) 97.5 F (36.4 C) 98.3 F (36.8 C) 98.5 F (36.9 C)  TempSrc:  Oral Oral Oral  SpO2: 96% 100% 100% 98%  Weight:      Height:        Intake/Output  Summary (Last 24 hours) at 01/04/2022 1720 Last data filed at 01/04/2022 1700 Gross per 24 hour  Intake 3089.56 ml  Output 1075 ml  Net 2014.56 ml   Filed Weights   01/02/22 1958  Weight: 81.6 kg    Examination:  General exam: Appears calm and comfortable  Respiratory system: Clear to auscultation.  No wheezes, no crackles, no  rhonchi.  Respiratory effort normal. Cardiovascular system: S1 & S2 heard, RRR. No JVD, murmurs, rubs, gallops or clicks. No pedal edema. Gastrointestinal system: Abdomen is nondistended, soft and nontender. No organomegaly or masses felt. Normal bowel sounds heard.  Ileostomy bag intact with stool noted with some slight leakage.  Incision site covered with bandage. Central nervous system: Alert and oriented. No focal neurological deficits. Extremities: Symmetric 5 x 5 power. Skin: No rashes, lesions or ulcers Psychiatry: Judgement and insight appear normal. Mood & affect appropriate.     Data Reviewed: I have personally reviewed following labs and imaging studies  CBC: Recent Labs  Lab 01/02/22 1812 01/04/22 0754  WBC 12.5* 6.5  NEUTROABS 7.3  --   HGB 14.9 12.5*  HCT 47.8 40.1  MCV 83.1 84.6  PLT 261 032    Basic Metabolic Panel: Recent Labs  Lab 01/02/22 1812 01/03/22 1049 01/04/22 0754  NA 133* 136 139  K 4.4 4.1 3.8  CL 91* 95* 103  CO2 29 31 26   GLUCOSE 109* 81 76  BUN 30* 27* 18  CREATININE 2.24* 1.84* 1.46*  CALCIUM 9.5 9.0 8.4*  MG  --   --  1.9  PHOS  --   --  3.2    GFR: Estimated Creatinine Clearance: 50 mL/min (A) (by C-G formula based on SCr of 1.46 mg/dL (H)).  Liver Function Tests: Recent Labs  Lab 01/02/22 1812 01/03/22 1049  AST 77* 75*  ALT 76* 66*  ALKPHOS 120 80  BILITOT 1.3* 1.1  PROT 8.3* 6.8  ALBUMIN 4.1 3.5    CBG: No results for input(s): "GLUCAP" in the last 168 hours.   No results found for this or any previous visit (from the past 240 hour(s)).       Radiology Studies: CT ABDOMEN PELVIS WO CONTRAST  Result Date: 01/03/2022 CLINICAL DATA:  Abdominal pain, draining wound, history of Crohn's disease. EXAM: CT ABDOMEN AND PELVIS WITHOUT CONTRAST TECHNIQUE: Multidetector CT imaging of the abdomen and pelvis was performed following the standard protocol without IV contrast. RADIATION DOSE REDUCTION: This exam was performed  according to the departmental dose-optimization program which includes automated exposure control, adjustment of the mA and/or kV according to patient size and/or use of iterative reconstruction technique. COMPARISON:  Nov 26, 2021. FINDINGS: Lower chest: No acute abnormality. Hepatobiliary: No focal liver abnormality is seen. No gallstones, gallbladder wall thickening, or biliary dilatation. Pancreas: Unremarkable. No pancreatic ductal dilatation or surrounding inflammatory changes. Spleen: Normal in size without focal abnormality. Adrenals/Urinary Tract: Adrenal glands are unremarkable. Kidneys are normal, without renal calculi, focal lesion, or hydronephrosis. Bladder is unremarkable. Stomach/Bowel: The stomach is unremarkable. Status post left hemicolectomy. Loop ileostomy is noted in right lower quadrant. Sigmoid surgical anastomosis is noted. No abnormal bowel dilatation or inflammation is noted. Vascular/Lymphatic: No significant vascular findings are present. No enlarged abdominal or pelvic lymph nodes. Reproductive: Stable mild prostatic enlargement is noted. Other: No definite abscess or significant fluid collection is noted. Probable postsurgical stranding or scarring is noted anteriorly in the pelvis. No definite hernia is noted. Musculoskeletal: No acute or significant osseous findings. IMPRESSION: Status  post left hemicolectomy and loop ileostomy for treatment of Crohn's disease. No acute abnormality is noted. Stable mild prostatic enlargement. Electronically Signed   By: Marijo Conception M.D.   On: 01/03/2022 14:21        Scheduled Meds:  enoxaparin (LOVENOX) injection  40 mg Subcutaneous Q24H   feeding supplement  237 mL Oral BID BM   loperamide  4 mg Oral TID AC   [START ON 01/05/2022] pantoprazole  40 mg Oral Q0600   sodium chloride flush  3 mL Intravenous Q12H   traZODone  50 mg Oral QHS   Continuous Infusions:  sodium chloride 150 mL/hr at 01/04/22 5537     LOS: 1 day    Time  spent: 35 minutes    Irine Seal, MD [facility]   To contact the attending provider between 7A-7P or the covering provider during after hours 7P-7A, please log into the web site [email or web address] and access using universal [place] password for that web site. If you do not have the password, please call the hospital operator.  01/04/2022, 5:20 PM

## 2022-01-04 NOTE — Progress Notes (Incomplete)
Initial Nutrition Assessment  DOCUMENTATION CODES:     INTERVENTION:  ***   NUTRITION DIAGNOSIS:    related to   as evidenced by  .   GOAL:     MONITOR:      REASON FOR ASSESSMENT:  Malnutrition Screening Tool    ASSESSMENT:  Pt with hx of GERD and crohn's disease s/p diverting loop ileostomy creation on 11/11/21 presented to ED with nausea and poor PO. Being followed by [facility] surgery team.  ***   Nutritionally Relevant Medications: Scheduled Meds:  Ensure Enlive   237 mL Oral BID BM   loperamide  4 mg Oral TID AC   Continuous Infusions:  sodium chloride 150 mL/hr at 01/04/22 0621   PRN Meds: loperamide, ondansetron  Labs Reviewed: ***  NUTRITION - FOCUSED PHYSICAL EXAM: {RD Focused Exam List:21252}  Diet Order:   Diet Order             Diet regular Room service appropriate? Yes; Fluid consistency: Thin  Diet effective now                   EDUCATION NEEDS:      Skin:     Last BM:     Height:   Ht Readings from Last 1 Encounters:  01/02/22 5' 10"  (1.778 m)    Weight:   Wt Readings from Last 1 Encounters:  01/02/22 81.6 kg    Ideal Body Weight:     BMI:  Body mass index is 25.81 kg/m.  Estimated Nutritional Needs:   Kcal:     Protein:     Fluid:       ***

## 2022-01-05 DIAGNOSIS — N179 Acute kidney failure, unspecified: Secondary | ICD-10-CM | POA: Diagnosis not present

## 2022-01-05 DIAGNOSIS — E43 Unspecified severe protein-calorie malnutrition: Secondary | ICD-10-CM | POA: Insufficient documentation

## 2022-01-05 DIAGNOSIS — K219 Gastro-esophageal reflux disease without esophagitis: Secondary | ICD-10-CM | POA: Diagnosis not present

## 2022-01-05 DIAGNOSIS — Z932 Ileostomy status: Secondary | ICD-10-CM | POA: Diagnosis not present

## 2022-01-05 DIAGNOSIS — R110 Nausea: Secondary | ICD-10-CM | POA: Diagnosis present

## 2022-01-05 DIAGNOSIS — K50918 Crohn's disease, unspecified, with other complication: Secondary | ICD-10-CM | POA: Diagnosis not present

## 2022-01-05 LAB — CBC WITH DIFFERENTIAL/PLATELET
Abs Immature Granulocytes: 0.01 10*3/uL (ref 0.00–0.07)
Basophils Absolute: 0 10*3/uL (ref 0.0–0.1)
Basophils Relative: 0 %
Eosinophils Absolute: 0.2 10*3/uL (ref 0.0–0.5)
Eosinophils Relative: 4 %
HCT: 34.7 % — ABNORMAL LOW (ref 39.0–52.0)
Hemoglobin: 10.8 g/dL — ABNORMAL LOW (ref 13.0–17.0)
Immature Granulocytes: 0 %
Lymphocytes Relative: 34 %
Lymphs Abs: 1.9 10*3/uL (ref 0.7–4.0)
MCH: 26.1 pg (ref 26.0–34.0)
MCHC: 31.1 g/dL (ref 30.0–36.0)
MCV: 83.8 fL (ref 80.0–100.0)
Monocytes Absolute: 0.4 10*3/uL (ref 0.1–1.0)
Monocytes Relative: 7 %
Neutro Abs: 3 10*3/uL (ref 1.7–7.7)
Neutrophils Relative %: 55 %
Platelets: 137 10*3/uL — ABNORMAL LOW (ref 150–400)
RBC: 4.14 MIL/uL — ABNORMAL LOW (ref 4.22–5.81)
RDW: 16 % — ABNORMAL HIGH (ref 11.5–15.5)
WBC: 5.5 10*3/uL (ref 4.0–10.5)
nRBC: 0 % (ref 0.0–0.2)

## 2022-01-05 LAB — COMPREHENSIVE METABOLIC PANEL
ALT: 44 U/L (ref 0–44)
AST: 37 U/L (ref 15–41)
Albumin: 2.6 g/dL — ABNORMAL LOW (ref 3.5–5.0)
Alkaline Phosphatase: 82 U/L (ref 38–126)
Anion gap: 7 (ref 5–15)
BUN: 11 mg/dL (ref 8–23)
CO2: 25 mmol/L (ref 22–32)
Calcium: 7.8 mg/dL — ABNORMAL LOW (ref 8.9–10.3)
Chloride: 108 mmol/L (ref 98–111)
Creatinine, Ser: 1.21 mg/dL (ref 0.61–1.24)
GFR, Estimated: >60 mL/min (ref >60)
Glucose, Bld: 87 mg/dL (ref 70–99)
Potassium: 3.2 mmol/L — ABNORMAL LOW (ref 3.5–5.1)
Sodium: 140 mmol/L (ref 135–145)
Total Bilirubin: 0.6 mg/dL (ref 0.3–1.2)
Total Protein: 5.4 g/dL — ABNORMAL LOW (ref 6.5–8.1)

## 2022-01-05 LAB — MAGNESIUM: Magnesium: 1.8 mg/dL (ref 1.7–2.4)

## 2022-01-05 LAB — C-REACTIVE PROTEIN: CRP: 1 mg/dL — ABNORMAL HIGH (ref <1.0)

## 2022-01-05 MED ORDER — POTASSIUM CHLORIDE CRYS ER 10 MEQ PO TBCR
40.0000 meq | EXTENDED_RELEASE_TABLET | Freq: Once | ORAL | Status: AC
  Administered 2022-01-05: 40 meq via ORAL
  Filled 2022-01-05: qty 4

## 2022-01-05 MED ORDER — SCOPOLAMINE 1 MG/3DAYS TD PT72
1.0000 | MEDICATED_PATCH | TRANSDERMAL | Status: DC
  Administered 2022-01-05: 1.5 mg via TRANSDERMAL
  Filled 2022-01-05: qty 1

## 2022-01-05 MED ORDER — PROMETHAZINE HCL 25 MG PO TABS
12.5000 mg | ORAL_TABLET | Freq: Four times a day (QID) | ORAL | Status: DC | PRN
Start: 2022-01-05 — End: 2022-01-06

## 2022-01-05 MED ORDER — BOOST / RESOURCE BREEZE PO LIQD CUSTOM
1.0000 | Freq: Three times a day (TID) | ORAL | Status: DC
  Administered 2022-01-05: 1 via ORAL

## 2022-01-05 MED ORDER — ADULT MULTIVITAMIN W/MINERALS CH
1.0000 | ORAL_TABLET | Freq: Every day | ORAL | Status: DC
  Administered 2022-01-05 – 2022-01-06 (×2): 1 via ORAL
  Filled 2022-01-05 (×2): qty 1

## 2022-01-05 NOTE — Plan of Care (Signed)

## 2022-01-05 NOTE — TOC Initial Note (Signed)
Transition of Care [facility]) - Initial/Assessment Note    Patient Details  Name: [name] MRN: [id number] Date of Birth: 01/06/1951  Transition of [facility]) CM/SW Contact:    Kingsley Plan, RN Phone Number: 01/05/2022, 10:06 AM  Clinical Narrative:                 Was asked by MD and WOC to send referral to [facility].   Discussed with patient at bedside. Patient in agreement. MD signed referral form. Referral form faxed confirmation received.   Confirmed face sheet information with patient.   PCP DR S. Clent Ridges .     Expected Discharge Plan: Home/Self Care Barriers to Discharge: Continued Medical Work up   Patient Goals and CMS Choice Patient states their goals for this hospitalization and ongoing recovery are:: to return to home CMS Medicare.gov Compare Post Acute Care list provided to:: Patient    Expected Discharge Plan and Services Expected Discharge Plan: Home/Self Care   Discharge Planning Services: CM Consult   Living arrangements for the past 2 months: Single Family Home                 DME Arranged: N/A DME Agency: NA       HH Arranged: NA          Prior Living Arrangements/Services Living arrangements for the past 2 months: Single Family Home Lives with:: Spouse Patient language and need for interpreter reviewed:: Yes        Need for Family Participation in Patient Care: Yes (Comment) Care giver support system in place?: Yes (comment)   Criminal Activity/Legal Involvement Pertinent to Current Situation/Hospitalization: No - Comment as needed  Activities of Daily Living Home Assistive Devices/Equipment: None ADL Screening (condition at time of admission) Patient's cognitive ability adequate to safely complete daily activities?: Yes Is the patient deaf or have difficulty hearing?: No Does the patient have difficulty seeing, even when wearing glasses/contacts?: No Does the patient have difficulty concentrating, remembering, or  making decisions?: No Patient able to express need for assistance with ADLs?: Yes Does the patient have difficulty dressing or bathing?: No Independently performs ADLs?: Yes (appropriate for developmental age) Does the patient have difficulty walking or climbing stairs?: No Weakness of Legs: None Weakness of Arms/Hands: None  Permission Sought/Granted   Permission granted to share information with : No              Emotional Assessment Appearance:: Appears stated age Attitude/Demeanor/Rapport: Engaged Affect (typically observed): Accepting Orientation: : Oriented to Self, Oriented to Place, Oriented to  Time, Oriented to Situation Alcohol / Substance Use: Not Applicable Psych Involvement: No (comment)  Admission diagnosis:  AKI (acute kidney injury) (HCC) [N17.9] Wound infection after surgery [T81.49XA] Patient Active Problem List   Diagnosis Date Noted   AKI (acute kidney injury) (HCC) 01/03/2022   Leukocytosis 01/03/2022   Transaminitis 01/03/2022   Crohn's disease (HCC) 01/03/2022   Ileostomy in place [facility]) 01/03/2022   Pacemaker 01/03/2022   Sepsis (HCC) 09/21/2021   Thrombocytopenia (HCC) 09/21/2021   Hypocalcemia 09/21/2021   Hypophosphatemia 09/21/2021   Hypokalemia 09/21/2021   External hemorrhoid, thrombosed 05/27/2019   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   Drug-induced acute pancreatitis without infection or necrosis - from 6 MP 09/24/2017   Long-term use of immunosuppressant medication  - Entyvio 06/28/2017   Crohn's ileitis (HCC)  05/03/2017   Psoriasis 08/23/2016   Insomnia 04/22/2014   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/06/2012   SVT (supraventricular  tachycardia) (HCC) 08/04/2012   Vitamin D deficiency 06/18/2012   ADHD 05/28/2009   BENIGN PROSTATIC HYPERTROPHY 07/24/2007   Asthma 03/06/2007   GERD 03/06/2007   DIVERTICULITIS, HX OF 03/06/2007   PCP:  [name], MD Pharmacy:   [facility] [phone] - [place], [place] - [address] AT [facility] [address] [place] [address] Phone: [phone] Fax: [phone]     Social Determinants of Health (SDOH) Interventions    Readmission Risk Interventions     No data to display

## 2022-01-05 NOTE — Progress Notes (Addendum)
PROGRESS NOTE    [name]  QIW:[id number] DOB: 1951-03-16 DOA: 01/02/2022 PCP: [name], MD    Chief Complaint  Patient presents with   Emesis    Brief Narrative:  Patient 71 year old gentleman history of Crohn's disease status post expiratory laparotomy, lysis of adhesions, small bowel resection, mesh explantation and diverting loop ileostomy creation on 11/11/2021 being followed by general surgery at [facility] presented with complaints of nausea poor oral intake.  Patient noted to have been admitted to the hospital 514 for postop hematoma and then again on 531 for failure to thrive symptoms.  Patient stated last few times he has been hospitalized has been getting home feeling nauseous leading to poor oral intake and being dehydrated.  Notes surgical wound has been healing and no significant change in discharge from the wound.  Patient on presentation in the ED noted to be in acute kidney injury likely due to prerenal azotemia from dehydration and placed on IV fluids.   Assessment & Plan:   Principal Problem:   AKI (acute kidney injury) ([place]) Active Problems:   Leukocytosis   Crohn's disease ([place])   GERD   Transaminitis   Ileostomy in place [facility])   Pacemaker   Protein-calorie malnutrition, severe   Nausea  #1 acute kidney injury -Patient on presentation to present with a creatinine of 2.24, BUN of 30. -Urinalysis with some ketones however no signs of infection. -Patient creatinine noted to be normal 5/13 at 1.08. -Renal function improving with hydration. -Continue antiemetics, avoid nephrotoxic agents, IV fluids. -Decrease IV fluid rate. -Repeat labs in the AM.  2.  Nausea -Patient noted to have nausea since surgery leading to poor oral intake and dehydration. -CT abdomen and pelvis done with no acute abnormalities and no active Crohn's disease obstruction noted. -Patient with continued nausea with some improvement with symptoms unable to  tolerate oral intake. -Continue PPI daily. -Continue AC Imodium and as needed Lomotil. -Patient being followed by GI and patient started on a scopolamine patch per GI in addition to as needed Phenergan. -Appreciate GI input and recommendations.  3.  Leukocytosis -Chronic -Patient noted to have a leukocytosis for the past 3 months. -Patient currently afebrile. -Urinalysis with no signs of infection. -CT abdomen and pelvis with any acute abnormalities. -Currently afebrile. -Leukocytosis has trended down. -No need for antibiotics.  4.  Crohn's disease status post ileostomy -Patient status post expiratory laparotomy, lysis of adhesions, small bowel resection, mesh explantation and diverting loop ileostomy creation on 11/01/4079 complicated by anastomotic leak. -Patient noted to be on Entyvio in the outpatient setting being followed at [facility]. -CT abdomen pelvis with no acute abnormality. -Ostomy care -Wound care consulted. -Imodium and Lomotil as needed. -Patient being set up for outpatient ostomy clinic.  5.  BPH -Stable.  6.  Status post PPM -Patient noted to have reported PPM placed for bradycardia. -EKG with paced rhythm.  7.  Transaminitis -CT abdomen and pelvis with no acute abnormalities. -LFTs trended down with hydration. -Decrease IV fluid rate. -Repeat labs in the AM.  8.  GERD -Continue PPI.    DVT prophylaxis: Lovenox Code Status: Full Family Communication: Updated patient.  No family at bedside Disposition: Likely home in 24 hours if continued improvement  Status is: Inpatient Remains inpatient appropriate because: Severity of illness.   Consultants:  Gastroenterology: Dr. Lorenso Courier 01/04/2022  Procedures:  CT abdomen and pelvis 01/11/2022   Antimicrobials:  None   Subjective: Patient sitting up in bed.  Continues to have nausea stating requiring Zofran prior to his meals.  No emesis.  Denies any chest pain.  No shortness of breath.  Patient  states has good urine output.  Objective: Vitals:   01/04/22 1544 01/05/22 0514 01/05/22 0758 01/05/22 1559  BP: (!) 98/54 115/66 126/67 117/67  Pulse: 71 (!) 59 62 64  Resp: 16 16 16 17   Temp: 98.5 F (36.9 C) 98.6 F (37 C) 97.6 F (36.4 C) 97.8 F (36.6 C)  TempSrc: Oral  Oral Oral  SpO2: 98% 97% 100% 100%  Weight:      Height:        Intake/Output Summary (Last 24 hours) at 01/05/2022 1737 Last data filed at 01/05/2022 1602 Gross per 24 hour  Intake 2523.24 ml  Output 1750 ml  Net 773.24 ml   Filed Weights   01/02/22 1958  Weight: 81.6 kg    Examination:  General exam: NAD. Respiratory system: CTA B.  No wheezes, no crackles, no rhonchi.  Normal respiratory effort.   Cardiovascular system: Regular rate rhythm no murmurs rubs or gallops.  No JVD.  No lower extremity edema.  Gastrointestinal system: Abdomen is nondistended, soft and nontender. No organomegaly or masses felt. Normal bowel sounds heard.  Ileostomy bag intact with stool noted with some slight leakage.  Incision site covered with bandage. Central nervous system: Alert and oriented. No focal neurological deficits. Extremities: Symmetric 5 x 5 power. Skin: No rashes, lesions or ulcers Psychiatry: Judgement and insight appear normal. Mood & affect appropriate.     Data Reviewed: I have personally reviewed following labs and imaging studies  CBC: Recent Labs  Lab 01/02/22 1812 01/04/22 0754 01/05/22 0603  WBC 12.5* 6.5 5.5  NEUTROABS 7.3  --  3.0  HGB 14.9 12.5* 10.8*  HCT 47.8 40.1 34.7*  MCV 83.1 84.6 83.8  PLT 261 150 137*    Basic Metabolic Panel: Recent Labs  Lab 01/02/22 1812 01/03/22 1049 01/04/22 0754 01/05/22 0603  NA 133* 136 139 140  K 4.4 4.1 3.8 3.2*  CL 91* 95* 103 108  CO2 29 31 26 25   GLUCOSE 109* 81 76 87  BUN 30* 27* 18 11  CREATININE 2.24* 1.84* 1.46* 1.21  CALCIUM 9.5 9.0 8.4* 7.8*  MG  --   --  1.9 1.8  PHOS  --   --  3.2  --     GFR: Estimated Creatinine  Clearance: 60.3 mL/min (by C-G formula based on SCr of 1.21 mg/dL).  Liver Function Tests: Recent Labs  Lab 01/02/22 1812 01/03/22 1049 01/05/22 0603  AST 77* 75* 37  ALT 76* 66* 44  ALKPHOS 120 80 82  BILITOT 1.3* 1.1 0.6  PROT 8.3* 6.8 5.4*  ALBUMIN 4.1 3.5 2.6*    CBG: No results for input(s): "GLUCAP" in the last 168 hours.   No results found for this or any previous visit (from the past 240 hour(s)).       Radiology Studies: No results found.      Scheduled Meds:  enoxaparin (LOVENOX) injection  40 mg Subcutaneous Q24H   feeding supplement  1 Container Oral TID BM   loperamide  4 mg Oral TID AC   multivitamin with minerals  1 tablet Oral Daily   pantoprazole  40 mg Oral Q0600   scopolamine  1 patch Transdermal Q72H   sodium chloride flush  3 mL Intravenous Q12H   traZODone  50 mg Oral QHS   Continuous Infusions:  sodium chloride 100  mL/hr at 01/05/22 1647     LOS: 2 days    Time spent: 35 minutes    Irine Seal, MD [facility]   To contact the attending provider between 7A-7P or the covering provider during after hours 7P-7A, please log into the web site [email or web address] and access using universal [place] password for that web site. If you do not have the password, please call the hospital operator.  01/05/2022, 5:37 PM

## 2022-01-05 NOTE — Consult Note (Signed)
[facility] Nurse ostomy follow up Stoma type/location:  RUQ ileostomy, placed at [facility].  Reversal planned for August.  Has been wearing 2 piece convex system at home.  When admitted to hospital, he began to have pouch leaks and was placed in a flat system that leaked and caused skin breakdown.  He was placed in a one piece convex pouch with peristomal skin crusted and protected with barrier ring. He reports no further issues, burning or itching.  He has 2 pouch sets in his room and is anticipating discharge today.    Stomal assessment/size: pink and moist, productive of brown liquid effluent.  pouch intact and will not disturb to promote skin healing Peristomal assessment: not assessed Treatment options for stomal/peristomal skin: was protected with stoma powder, skin prep and barrier ring yesterday. This pouch is in place today. Output liquid brown effluent  Ostomy pouching: 1pc. convex Education provided: We discuss dehydration and need to take in enough fluids and electrolytes.  He expresses knowledge, but becomes very nauseated. He is knowledgeable about risk of blockage with ileostomy and overall self care.  Enrolled patient in [place] Start Discharge program: Yes with [facility]. Has no issue with supply acquisition.  Would like to be set up at the outpatient ostomy clinic as a community resource and this could hopefully prevent hospital readmissions. I will request that the referral be made.  Will not follow at this time.  Please re-consult if needed. Anticipate discharge today.  Domenic Moras MSN, RN, FNP-BC CWON Wound, Ostomy, Continence Nurse Pager [phone]

## 2022-01-06 ENCOUNTER — Encounter: Payer: Self-pay | Admitting: Internal Medicine

## 2022-01-06 ENCOUNTER — Other Ambulatory Visit (HOSPITAL_COMMUNITY): Payer: Self-pay

## 2022-01-06 DIAGNOSIS — Z932 Ileostomy status: Secondary | ICD-10-CM | POA: Diagnosis not present

## 2022-01-06 DIAGNOSIS — R110 Nausea: Secondary | ICD-10-CM | POA: Diagnosis not present

## 2022-01-06 DIAGNOSIS — K219 Gastro-esophageal reflux disease without esophagitis: Secondary | ICD-10-CM | POA: Diagnosis not present

## 2022-01-06 DIAGNOSIS — E43 Unspecified severe protein-calorie malnutrition: Secondary | ICD-10-CM

## 2022-01-06 DIAGNOSIS — K50918 Crohn's disease, unspecified, with other complication: Secondary | ICD-10-CM | POA: Diagnosis not present

## 2022-01-06 DIAGNOSIS — N179 Acute kidney failure, unspecified: Secondary | ICD-10-CM | POA: Diagnosis not present

## 2022-01-06 LAB — BASIC METABOLIC PANEL
Anion gap: 7 (ref 5–15)
BUN: 5 mg/dL — ABNORMAL LOW (ref 8–23)
CO2: 23 mmol/L (ref 22–32)
Calcium: 8.1 mg/dL — ABNORMAL LOW (ref 8.9–10.3)
Chloride: 112 mmol/L — ABNORMAL HIGH (ref 98–111)
Creatinine, Ser: 1.1 mg/dL (ref 0.61–1.24)
GFR, Estimated: >60 mL/min (ref >60)
Glucose, Bld: 83 mg/dL (ref 70–99)
Potassium: 3.4 mmol/L — ABNORMAL LOW (ref 3.5–5.1)
Sodium: 142 mmol/L (ref 135–145)

## 2022-01-06 LAB — CBC
HCT: 35.3 % — ABNORMAL LOW (ref 39.0–52.0)
Hemoglobin: 10.6 g/dL — ABNORMAL LOW (ref 13.0–17.0)
MCH: 25.5 pg — ABNORMAL LOW (ref 26.0–34.0)
MCHC: 30 g/dL (ref 30.0–36.0)
MCV: 85.1 fL (ref 80.0–100.0)
Platelets: 148 10*3/uL — ABNORMAL LOW (ref 150–400)
RBC: 4.15 MIL/uL — ABNORMAL LOW (ref 4.22–5.81)
RDW: 16 % — ABNORMAL HIGH (ref 11.5–15.5)
WBC: 5.6 10*3/uL (ref 4.0–10.5)
nRBC: 0 % (ref 0.0–0.2)

## 2022-01-06 MED ORDER — POTASSIUM CHLORIDE CRYS ER 10 MEQ PO TBCR: 40.0000 meq | EXTENDED_RELEASE_TABLET | Freq: Once | ORAL | Status: DC

## 2022-01-06 MED ORDER — PANTOPRAZOLE SODIUM 40 MG PO TBEC
40.0000 mg | DELAYED_RELEASE_TABLET | Freq: Every day | ORAL | 1 refills | Status: DC
  Filled 2022-01-06: qty 30, 30d supply, fill #0

## 2022-01-06 MED ORDER — LOPERAMIDE HCL 2 MG PO CAPS
4.0000 mg | ORAL_CAPSULE | Freq: Three times a day (TID) | ORAL | 1 refills | Status: DC
  Filled 2022-01-06: qty 150, 25d supply, fill #0

## 2022-01-06 MED ORDER — POTASSIUM CHLORIDE 20 MEQ PO PACK
40.0000 meq | PACK | Freq: Once | ORAL | Status: AC
Start: 2022-01-06 — End: 2022-01-06
  Administered 2022-01-06: 40 meq via ORAL
  Filled 2022-01-06: qty 2

## 2022-01-06 MED ORDER — ONDANSETRON 4 MG PO TBDP
ORAL_TABLET | ORAL | 0 refills | Status: DC
  Filled 2022-01-06: qty 60, 37d supply, fill #0

## 2022-01-06 MED ORDER — SCOPOLAMINE 1 MG/3DAYS TD PT72
1.0000 | MEDICATED_PATCH | TRANSDERMAL | 1 refills | Status: DC
  Filled 2022-01-06: qty 8, 24d supply, fill #0

## 2022-01-06 MED ORDER — LOSARTAN POTASSIUM 25 MG PO TABS: 25.0000 mg | ORAL_TABLET | Freq: Every day | ORAL | 0 refills | Status: DC

## 2022-01-06 NOTE — TOC Progression Note (Signed)
Transition of Care [facility]) - Progression Note    Patient Details  Name: [name] MRN: [id number] Date of Birth: 06-09-1951  Transition of Care [facility]) CM/SW Contact  Nadene Rubins Adria Devon, RN Phone Number: 01/06/2022, 12:36 PM  Clinical Narrative:     Patient active with [facility] for HHPT/OT. Brandi with [facility] requesting resumption of care orders. Same entered for MD signature   Expected Discharge Plan: Home/Self Care Barriers to Discharge: Continued Medical Work up  Expected Discharge Plan and Services Expected Discharge Plan: Home/Self Care   Discharge Planning Services: CM Consult   Living arrangements for the past 2 months: Single Family Home                 DME Arranged: N/A DME Agency: NA       HH Arranged: NA           Social Determinants of Health (SDOH) Interventions    Readmission Risk Interventions     No data to display

## 2022-01-09 ENCOUNTER — Telehealth: Payer: Self-pay

## 2022-01-12 ENCOUNTER — Encounter: Payer: Self-pay | Admitting: Internal Medicine

## 2022-01-18 ENCOUNTER — Ambulatory Visit (INDEPENDENT_AMBULATORY_CARE_PROVIDER_SITE_OTHER): Payer: Medicare Other | Admitting: Family Medicine

## 2022-01-18 ENCOUNTER — Encounter: Payer: Self-pay | Admitting: Family Medicine

## 2022-01-18 ENCOUNTER — Telehealth: Payer: Self-pay | Admitting: Internal Medicine

## 2022-01-18 VITALS — BP 110/74 | HR 71 | Temp 98.0°F | Wt 156.4 lb

## 2022-01-18 DIAGNOSIS — K50918 Crohn's disease, unspecified, with other complication: Secondary | ICD-10-CM | POA: Diagnosis not present

## 2022-01-18 DIAGNOSIS — K219 Gastro-esophageal reflux disease without esophagitis: Secondary | ICD-10-CM

## 2022-01-18 DIAGNOSIS — E860 Dehydration: Secondary | ICD-10-CM

## 2022-01-18 DIAGNOSIS — N179 Acute kidney failure, unspecified: Secondary | ICD-10-CM | POA: Diagnosis not present

## 2022-01-18 DIAGNOSIS — B379 Candidiasis, unspecified: Secondary | ICD-10-CM

## 2022-01-18 DIAGNOSIS — R110 Nausea: Secondary | ICD-10-CM

## 2022-01-18 DIAGNOSIS — Z932 Ileostomy status: Secondary | ICD-10-CM

## 2022-01-18 LAB — BASIC METABOLIC PANEL
BUN: 10 mg/dL (ref 6–23)
CO2: 30 mEq/L (ref 19–32)
Calcium: 9.9 mg/dL (ref 8.4–10.5)
Chloride: 98 mEq/L (ref 96–112)
Creatinine, Ser: 1.35 mg/dL (ref 0.40–1.50)
GFR: 53.75 mL/min — ABNORMAL LOW (ref >60.00)
Glucose, Bld: 88 mg/dL (ref 70–99)
Potassium: 3.8 mEq/L (ref 3.5–5.1)
Sodium: 139 mEq/L (ref 135–145)

## 2022-01-18 MED ORDER — KETOCONAZOLE 2 % EX CREA: TOPICAL_CREAM | CUTANEOUS | 5 refills | Status: DC

## 2022-01-18 MED ORDER — MIRTAZAPINE 15 MG PO TABS: 15.0000 mg | ORAL_TABLET | Freq: Every day | ORAL | 1 refills | Status: DC

## 2022-01-18 MED ORDER — PROMETHAZINE HCL 25 MG PO TABS: 25.0000 mg | ORAL_TABLET | Freq: Four times a day (QID) | ORAL | 2 refills | Status: DC | PRN

## 2022-01-18 NOTE — Telephone Encounter (Signed)
Still struggling with nausea, poor appetite and also has insomnia (not new). Uses Trazodone. Just saw Dr. [name] and BMET checked. Phenergan Rxed  I am going to add mirtazapine 15 mg qhs for his sxs above - use instead of trazodone (dc)

## 2022-01-18 NOTE — Progress Notes (Signed)
   Subjective:    Patient ID: [name], male    DOB: February 05, 1951, 71 y.o.   MRN: [id number]  HPI Here to follow up a hospital stay from 01-02-22 to 6--23-23 for acute renal failure from dehydration and for abdominal pain. He had had prior surgery to repair a ventral hernia, but the mesh had worked itself loose. During this stay he had surgery to remove the mesh and to tighten up his abdominal muscles. He was also vigorously rehydrated with IV fluids. On admission his creatinine was 2.24 and this had come down to 1.84 by the time of DC. Since then his abdominal pain has improved, but he suffers from constant nausea so he is unable to eat any food. He tries to drink as much as possible. He is taking Zofran for the nausea but this has little benefit. Also since getting home he has had a red itchy burning rash on the skin around his stoma. No fevers.    Review of Systems  Constitutional:  Positive for fatigue. Negative for chills, diaphoresis and fever.  Respiratory: Negative.    Cardiovascular: Negative.   Gastrointestinal:  Positive for nausea. Negative for abdominal distention, abdominal pain, anal bleeding, blood in stool, constipation, diarrhea and vomiting.  Genitourinary: Negative.        Objective:   Physical Exam Constitutional:      Comments: He is quite frail and weak, and he has lost a lot of weight.   Cardiovascular:     Rate and Rhythm: Normal rate and regular rhythm.     Pulses: Normal pulses.     Heart sounds: Normal heart sounds.  Pulmonary:     Effort: Pulmonary effort is normal.     Breath sounds: Normal breath sounds.  Abdominal:     General: Abdomen is flat. There is no distension.     Palpations: Abdomen is soft. There is no mass.     Tenderness: There is no abdominal tenderness.     Hernia: No hernia is present.     Comments: The surgical wound is not tender and there is no DC. The skin around it has macular erythema   Lymphadenopathy:     Cervical: No  cervical adenopathy.  Neurological:     Mental Status: He is alert.           Assessment & Plan:  He is recovering from a surgery to reconstruct his abdominal wall, and he is dealing with constant nausea. He will stop the Zofran and try Phenergan 25 mg as needed for nausea instead. This has caused him to be dehydrated, so he will drink plenty of fluids. We will check a BMET today to follow his renal function. He also has a Candidal infection at the stoma site, so we will treat this with Ketoconazole cream BID. We spent a total of ( 35  ) minutes reviewing records and discussing these issues.  Alysia Penna, MD

## 2022-01-20 ENCOUNTER — Encounter (HOSPITAL_COMMUNITY): Payer: Self-pay | Admitting: Nurse Practitioner

## 2022-01-20 ENCOUNTER — Ambulatory Visit (HOSPITAL_COMMUNITY): Payer: Medicare Other | Admitting: Nurse Practitioner

## 2022-01-24 ENCOUNTER — Emergency Department (HOSPITAL_BASED_OUTPATIENT_CLINIC_OR_DEPARTMENT_OTHER): Payer: Medicare Other

## 2022-01-24 ENCOUNTER — Encounter: Payer: Self-pay | Admitting: Family Medicine

## 2022-01-24 ENCOUNTER — Encounter (HOSPITAL_BASED_OUTPATIENT_CLINIC_OR_DEPARTMENT_OTHER): Payer: Self-pay

## 2022-01-24 ENCOUNTER — Emergency Department (HOSPITAL_BASED_OUTPATIENT_CLINIC_OR_DEPARTMENT_OTHER)
Admission: EM | Admit: 2022-01-24 | Discharge: 2022-01-25 | Disposition: A | Discharge status: Other Acute Inpatient Hospital | Payer: Medicare Other | Attending: Emergency Medicine | Admitting: Emergency Medicine

## 2022-01-24 ENCOUNTER — Telehealth (INDEPENDENT_AMBULATORY_CARE_PROVIDER_SITE_OTHER): Payer: Medicare Other | Admitting: Family Medicine

## 2022-01-24 ENCOUNTER — Other Ambulatory Visit: Payer: Self-pay

## 2022-01-24 VITALS — Temp 99.0°F | Wt 155.0 lb

## 2022-01-24 DIAGNOSIS — R3912 Poor urinary stream: Secondary | ICD-10-CM | POA: Insufficient documentation

## 2022-01-24 DIAGNOSIS — R300 Dysuria: Secondary | ICD-10-CM | POA: Insufficient documentation

## 2022-01-24 DIAGNOSIS — R509 Fever, unspecified: Secondary | ICD-10-CM

## 2022-01-24 DIAGNOSIS — N179 Acute kidney failure, unspecified: Secondary | ICD-10-CM

## 2022-01-24 DIAGNOSIS — R102 Pelvic and perineal pain: Secondary | ICD-10-CM | POA: Diagnosis not present

## 2022-01-24 DIAGNOSIS — J45909 Unspecified asthma, uncomplicated: Secondary | ICD-10-CM | POA: Diagnosis not present

## 2022-01-24 DIAGNOSIS — R5381 Other malaise: Secondary | ICD-10-CM | POA: Diagnosis not present

## 2022-01-24 DIAGNOSIS — R1031 Right lower quadrant pain: Secondary | ICD-10-CM

## 2022-01-24 DIAGNOSIS — K632 Fistula of intestine: Secondary | ICD-10-CM

## 2022-01-24 LAB — COMPREHENSIVE METABOLIC PANEL
ALT: 7 U/L (ref 0–44)
AST: 21 U/L (ref 15–41)
Albumin: 4.4 g/dL (ref 3.5–5.0)
Alkaline Phosphatase: 77 U/L (ref 38–126)
Anion gap: 13 (ref 5–15)
BUN: 18 mg/dL (ref 8–23)
CO2: 30 mmol/L (ref 22–32)
Calcium: 10.3 mg/dL (ref 8.9–10.3)
Chloride: 96 mmol/L — ABNORMAL LOW (ref 98–111)
Creatinine, Ser: 1.61 mg/dL — ABNORMAL HIGH (ref 0.61–1.24)
GFR, Estimated: 46 mL/min — ABNORMAL LOW (ref >60)
Glucose, Bld: 81 mg/dL (ref 70–99)
Potassium: 3.9 mmol/L (ref 3.5–5.1)
Sodium: 139 mmol/L (ref 135–145)
Total Bilirubin: 0.6 mg/dL (ref 0.3–1.2)
Total Protein: 8.1 g/dL (ref 6.5–8.1)

## 2022-01-24 LAB — CBC WITH DIFFERENTIAL/PLATELET
Abs Immature Granulocytes: 0.02 10*3/uL (ref 0.00–0.07)
Basophils Absolute: 0 10*3/uL (ref 0.0–0.1)
Basophils Relative: 0 %
Eosinophils Absolute: 0.1 10*3/uL (ref 0.0–0.5)
Eosinophils Relative: 1 %
HCT: 48.9 % (ref 39.0–52.0)
Hemoglobin: 15.6 g/dL (ref 13.0–17.0)
Immature Granulocytes: 0 %
Lymphocytes Relative: 39 %
Lymphs Abs: 3.7 10*3/uL (ref 0.7–4.0)
MCH: 25.8 pg — ABNORMAL LOW (ref 26.0–34.0)
MCHC: 31.9 g/dL (ref 30.0–36.0)
MCV: 81 fL (ref 80.0–100.0)
Monocytes Absolute: 0.6 10*3/uL (ref 0.1–1.0)
Monocytes Relative: 6 %
Neutro Abs: 5.1 10*3/uL (ref 1.7–7.7)
Neutrophils Relative %: 54 %
Platelets: 225 10*3/uL (ref 150–400)
RBC: 6.04 MIL/uL — ABNORMAL HIGH (ref 4.22–5.81)
RDW: 15.9 % — ABNORMAL HIGH (ref 11.5–15.5)
WBC: 9.5 10*3/uL (ref 4.0–10.5)
nRBC: 0 % (ref 0.0–0.2)

## 2022-01-24 LAB — URINALYSIS, ROUTINE W REFLEX MICROSCOPIC
Bilirubin Urine: NEGATIVE
Glucose, UA: NEGATIVE mg/dL
Hgb urine dipstick: NEGATIVE
Ketones, ur: NEGATIVE mg/dL
Nitrite: NEGATIVE
Protein, ur: 30 mg/dL — AB
Specific Gravity, Urine: 1.032 — ABNORMAL HIGH (ref 1.005–1.030)
pH: 6 (ref 5.0–8.0)

## 2022-01-24 MED ORDER — OXYCODONE-ACETAMINOPHEN 5-325 MG PO TABS
1.0000 | ORAL_TABLET | Freq: Once | ORAL | Status: AC
  Administered 2022-01-24: 1 via ORAL
  Filled 2022-01-24: qty 1

## 2022-01-24 MED ORDER — SODIUM CHLORIDE 0.9 % IV BOLUS
1000.0000 mL | Freq: Once | INTRAVENOUS | Status: AC
  Administered 2022-01-25: 1000 mL via INTRAVENOUS

## 2022-01-24 MED ORDER — SODIUM CHLORIDE 0.9 % IV BOLUS
1000.0000 mL | Freq: Once | INTRAVENOUS | Status: AC
  Administered 2022-01-24: 1000 mL via INTRAVENOUS

## 2022-01-24 MED ORDER — PIPERACILLIN-TAZOBACTAM 3.375 G IVPB 30 MIN
3.3750 g | Freq: Once | INTRAVENOUS | Status: AC
  Administered 2022-01-25: 3.375 g via INTRAVENOUS
  Filled 2022-01-24: qty 50

## 2022-01-24 NOTE — ED Provider Notes (Signed)
[facility] EMERGENCY DEPT Provider Note   CSN: [id number] Arrival date & time: 01/24/22  1602     History  Chief Complaint  Patient presents with   Dysuria   Back Pain   Urinary Frequency    [name] is a 71 y.o. male with history of asthma, SVT, GI hemorrhage, epididymitis, acute prostatitis, recent left infection, Crohn's disease status post ileostomy.  Presents to the emergency department complaining of bilateral flank pain, dysuria, and pain to perineum.  Patient reports that his symptoms started 4 days ago.  Patient is having pain to bilateral flanks with right greater than left.  Patient has had decreased urinary output and dysuria over the last 4 days as well.  Patient is also complaining of pain to his perineum.  Pain does not radiate.  Patient reports that he has had a fever over the last 2 days with a Tmax of 99.0 F this morning.  Patient has been eating and drinking as normal however states he is losing a lot of fluid to his ileostomy.  Patient denies any ileostomy output.  Patient reports that wound to his abdomen has been healing well without any pain, purulent discharge, or erythema.  Patient denies any recent falls or injuries.  Denies any swelling or tenderness to genitals, genital sores or lesions, penile discharge, hematuria, urinary frequency, UA abdominal pain, nausea, vomiting, numbness, weakness, saddle anesthesia.    Patient denies any receptive anal sex.  Patient is not sexually active.   Dysuria Presenting symptoms: dysuria   Presenting symptoms: no penile pain   Associated symptoms: flank pain   Associated symptoms: no abdominal pain, no fever, no nausea, no penile swelling, no scrotal swelling, no urinary frequency and no vomiting   Back Pain Associated symptoms: dysuria   Associated symptoms: no abdominal pain, no chest pain, no fever and no headaches   Urinary Frequency Pertinent negatives include no chest pain, no abdominal  pain, no headaches and no shortness of breath.       Home Medications Prior to Admission medications   Medication Sig Start Date End Date Taking? Authorizing Provider  albuterol (PROVENTIL HFA;VENTOLIN HFA) 108 (90 Base) MCG/ACT inhaler INHALE 2 PUFFS INTO THE LUNGS EVERY 4 (FOUR) HOURS AS NEEDED FOR WHEEZING OR SHORTNESS OF BREATH. 10/24/18   [name], MD  diphenoxylate-atropine (LOMOTIL) 2.5-0.025 MG tablet 1-2 tablets 4 times a day as neded 08/15/21   Gatha Mayer, MD  EPINEPHrine (EPIPEN 2-PAK) 0.3 mg/0.3 mL IJ SOAJ injection Inject 0.3 mLs (0.3 mg total) into the muscle as needed for anaphylaxis. 11/09/18   Sherwood Gambler, MD  fluticasone (CUTIVATE) 0.05 % cream Apply 1 application topically 2 (two) times daily as needed for irritation. 11/02/20   [provider]  halobetasol (ULTRAVATE) 0.05 % ointment Apply topically 2 (two) times daily. Patient taking differently: Apply 1 application  topically daily as needed (for psoriasis). 01/08/18   [name], MD  hyoscyamine (LEVSIN SL) 0.125 MG SL tablet PLACE 1 TABLET UNDER THE TONGUE EVERY 4 HOURS AS NEEDED FOR CRAMPING (URGENT DEFECATION) Patient taking differently: Take 0.125 mg by mouth every 4 (four) hours as needed for cramping (for cramping (urgent defecation)). 08/15/21   Gatha Mayer, MD  ketoconazole (NIZORAL) 2 % cream APPLY TWICE A DAY AS NEEDED FOR FUNGAL INFECTION 01/18/22   [name], MD  loperamide (IMODIUM) 2 MG capsule Take 2 capsules (4 mg total) by mouth 3 (three) times daily before meals. 01/06/22  Eugenie Filler, MD  losartan (COZAAR) 25 MG tablet Take 1 tablet (25 mg total) by mouth daily. Call and schedule follow up visit for further refills. [phone]. 2nd attempt 01/09/22   Eugenie Filler, MD  mirtazapine (REMERON) 15 MG tablet Take 1 tablet (15 mg total) by mouth at bedtime. 01/18/22   Gatha Mayer, MD  Multiple Vitamins-Minerals (MULTIVITAMIN WITH MINERALS) tablet Take 1 tablet by mouth  daily.    [provider]  pantoprazole (PROTONIX) 40 MG tablet Take 1 tablet (40 mg total) by mouth daily at 6 (six) AM. 01/07/22   Eugenie Filler, MD  pramoxine-hydrocortisone [facility]) 1-1 % rectal cream Place 1 application rectally 2 (two) times daily. Use twice daily before SITZ baths 04/21/19   Milus Banister, MD  promethazine (PHENERGAN) 25 MG tablet Take 1 tablet (25 mg total) by mouth every 6 (six) hours as needed for nausea or vomiting. 01/18/22   [name], MD  scopolamine (TRANSDERM-SCOP) 1 MG/3DAYS Place 1 patch (1.5 mg total) onto the skin every 3 (three) days. 01/08/22   Eugenie Filler, MD  tadalafil (CIALIS) 5 MG tablet Take 1 tablet (5 mg total) by mouth daily. 11/23/17   [name], MD  vedolizumab (ENTYVIO) 300 MG injection INFUSE 300 MG IV every 8 weeks Patient taking differently: Inject 300 mg into the vein every 28 (twenty-eight) days. Every 4 weeks 08/26/21   Gatha Mayer, MD      Allergies    Mercaptopurine, Shellfish allergy, Humira [adalimumab], and Wound dressing adhesive    Review of Systems   Review of Systems  Constitutional:  Negative for chills and fever.  Respiratory:  Negative for shortness of breath.   Cardiovascular:  Negative for chest pain.  Gastrointestinal:  Negative for abdominal distention, abdominal pain, nausea, rectal pain and vomiting.  Genitourinary:  Positive for decreased urine volume, dysuria, flank pain and urgency. Negative for difficulty urinating, frequency, penile pain, penile swelling, scrotal swelling and testicular pain.  Musculoskeletal:  Positive for back pain. Negative for neck pain.  Skin:  Negative for color change and rash.  Neurological:  Negative for dizziness, syncope, light-headedness and headaches.  Psychiatric/Behavioral:  Negative for confusion.     Physical Exam Updated Vital Signs BP 112/90 (BP Location: Right Arm)   Pulse 91   Temp 97.7 F (36.5 C)   Resp 18   SpO2 100%  Physical  Exam Vitals and nursing note reviewed.  Constitutional:      General: He is not in acute distress.    Appearance: He is not ill-appearing, toxic-appearing or diaphoretic.  HENT:     Head: Normocephalic.  Eyes:     General: No scleral icterus.       Right eye: No discharge.        Left eye: No discharge.  Cardiovascular:     Rate and Rhythm: Normal rate.  Pulmonary:     Effort: Pulmonary effort is normal.  Abdominal:     General: Abdomen is flat. Bowel sounds are normal. There is no distension. There are no signs of injury.     Palpations: Abdomen is soft. There is no mass or pulsatile mass.     Tenderness: There is abdominal tenderness. There is no right CVA tenderness, left CVA tenderness, guarding or rebound.     Comments: Minimal tenderness to left lower quadrant.  Ileostomy to right upper quadrant.  Light brown material noted in ileostomy bag.  Wound to lower abdomen below umbilicus.  No surrounding erythema.  Purulent discharge noted around wound.  Wound packing in place.  Musculoskeletal:     Cervical back: No swelling, edema, deformity, erythema, signs of trauma, lacerations, rigidity, spasms, torticollis, tenderness, bony tenderness or crepitus. Normal range of motion.     Thoracic back: No swelling, edema, deformity, signs of trauma, lacerations, spasms, tenderness or bony tenderness.     Lumbar back: No swelling, edema, deformity, signs of trauma, lacerations, spasms, tenderness or bony tenderness.  Skin:    General: Skin is warm and dry.  Neurological:     General: No focal deficit present.     Mental Status: He is alert.  Psychiatric:        Behavior: Behavior is cooperative.     ED Results / Procedures / Treatments   Labs (all labs ordered are listed, but only abnormal results are displayed) Labs Reviewed  URINALYSIS, ROUTINE W REFLEX MICROSCOPIC - Abnormal; Notable for the following components:      Result Value   Specific Gravity, Urine 1.032 (*)    Protein, ur  30 (*)    Leukocytes,Ua TRACE (*)    All other components within normal limits  COMPREHENSIVE METABOLIC PANEL - Abnormal; Notable for the following components:   Chloride 96 (*)    Creatinine, Ser 1.61 (*)    GFR, Estimated 46 (*)    All other components within normal limits  CBC WITH DIFFERENTIAL/PLATELET - Abnormal; Notable for the following components:   RBC 6.04 (*)    MCH 25.8 (*)    RDW 15.9 (*)    All other components within normal limits  URINE CULTURE    EKG None  Radiology CT Renal Stone Study  Result Date: 01/24/2022 CLINICAL DATA:  Dysuria, back/groin pain, ileostomy EXAM: CT ABDOMEN AND PELVIS WITHOUT CONTRAST TECHNIQUE: Multidetector CT imaging of the abdomen and pelvis was performed following the standard protocol without IV contrast. RADIATION DOSE REDUCTION: This exam was performed according to the departmental dose-optimization program which includes automated exposure control, adjustment of the mA and/or kV according to patient size and/or use of iterative reconstruction technique. COMPARISON:  Multiple priors, most recently 01/03/2022 FINDINGS: Lower chest: Mild scarring at the posterolateral right hemithorax. Pacemaker leads, incompletely visualized. Hepatobiliary: Unenhanced liver is unremarkable. Gallbladder is unremarkable. No hepatic or extrahepatic ductal dilatation. Pancreas: Within normal limits. Spleen: Within normal limits. Adrenals/Urinary Tract: Adrenal glands are within normal limits. Kidneys are within normal limits. No renal calculi or hydronephrosis. Bladder is underdistended. 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 Stomach/Bowel: Stomach is notable for a tiny hiatal hernia. No evidence of bowel obstruction. Diverting ileostomy in the right mid abdomen. Adjacent to the loop of ileum, there are inflammatory changes along the right mid  abdominal mesentery (series 2/image 49), with fistulous communication extending to the anterior lower pelvis (series 2/image 87), likely to a loop of small bowel (series 2/image 70). Two additional loops of small bowel opposed the anterior abdominal wall (series 2/image 53 and 59). Along the midline anterior abdominal wall there is residual soft tissue and gas, at the site of prior wound dressing. 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 Normal appendix (series 2/image 86). Status post right hemicolectomy with suture line in the right lower pelvis (series 2/image 68). Vascular/Lymphatic: No evidence of abdominal aortic aneurysm. Atherosclerotic calcifications of the abdominal aorta and branch vessels. No suspicious abdominopelvic lymphadenopathy. Reproductive: Prostatomegaly  with dystrophic calcifications. Other: No abdominopelvic ascites. Musculoskeletal: Visualized osseous structures are within normal limits. IMPRESSION: Status post right hemicolectomy with diverting ileostomy in the right mid abdomen. Residual fluid/tissue along the midline anterior abdominal wall, with two adjacent loops of small bowel. 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 Involvement of the anterior/superior bladder dome is possible. No associated abscess. Electronically Signed   By: Julian Hy M.D.   On: 01/24/2022 20:33   US SCROTUM W/DOPPLER  Result Date: 01/24/2022 CLINICAL DATA:  Testicular pain and dysuria. Patient reports history of epididymitis and prostatitis. EXAM: SCROTAL ULTRASOUND DOPPLER ULTRASOUND OF THE TESTICLES TECHNIQUE: Complete ultrasound examination of the testicles, epididymis, and other scrotal structures was performed. Color and spectral  Doppler ultrasound were also utilized to evaluate blood flow to the testicles. COMPARISON:  None Available. FINDINGS: Right testicle Measurements: 3.6 x 1.7 x 2.4 cm. Homogeneous echogenicity. Normal blood flow. No mass or microlithiasis visualized. Left testicle Measurements: 3.4 x 1.9 x 2.1 cm. Homogeneous echogenicity. Normal blood flow. There is a small intratesticular cyst measuring 4 mm near the mediastinum testis. No solid mass or microlithiasis visualized. Right epididymis:  Normal in size and appearance. Left epididymis: There are multiple epididymal head cysts, largest measuring 4 mm. No hyperemia. Hydrocele:  Small bilateral. Varicocele:  None visualized. Pulsed Doppler interrogation of both testes demonstrates normal low resistance arterial and venous waveforms bilaterally. IMPRESSION: 1. No explanation for pain. Left epididymal head cysts. No evidence of epididymitis. 2. Small intra testicular cyst on the left, 4 mm. This is of doubtful clinical significance. Electronically Signed   By: Keith Rake M.D.   On: 01/24/2022 19:38    Procedures Procedures    Medications Ordered in ED Medications  piperacillin-tazobactam (ZOSYN) IVPB 3.375 g (has no administration in time range)  sodium chloride 0.9 % bolus 1,000 mL (has no administration in time range)  sodium chloride 0.9 % bolus 1,000 mL (0 mLs Intravenous Hold 01/24/22 1958)  oxyCODONE-acetaminophen (PERCOCET/ROXICET) 5-325 MG per tablet 1 tablet (1 tablet Oral Given 01/24/22 2011)    ED Course/ Medical Decision Making/ A&P                           Medical Decision Making Amount and/or Complexity of Data Reviewed Labs: ordered. Radiology: ordered.  Risk Prescription drug management.   Alert 72 year old male no acute distress, nontoxic-appearing.  Presents the emergency department with a chief complaint of bilateral flank pain, dysuria, and pain to perineum.  Information obtained from patient.  I reviewed patient's past  medical records including previous admission/discharge notes, previous specialty provider notes, previous provider notes, labs, and imaging.  Patient has medical history as outlined in HPI which complicates his care.  Differential diagnosis includes but is not limited to postoperative complication, ureteral/renal calculus, pyelonephritis, proctitis, testicular torsion.  Abdominal labs ordered, will obtain ultrasound of scrotum to rule out testicular torsion.  Additionally will obtain CT abdomen pelvis with contrast to look for postoperative complication/ureteral stone.  I personally viewed and interpreted patient's lab results.  Pertinent findings include: -No leukocytosis or anemia -AKI with creatinine at 1.61, BUN within normal limits -Urinalysis shows no signs of infection however increased [place] gravity likely secondary to decreased p.o. intake.  Due to patient's decreased GFR unable to obtain CT imaging with contrast.  We will change to  abdomen pelvis without contrast.  Ultrasound imaging shows left epididymal head cyst.  No evidence of epididymitis.  Small intratesticular cyst on the left 4 mm.  I personally viewed and interpreted patient CT imaging.  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  Involvement of the anterior/superior bladder dome is possible.  Due to concern for enteroenteric fistula as well as enterocutaneous fistula we will start patient on broad-spectrum antibiotics with Zosyn.  We will consult on-call general surgeon with [facility] as patient has previously had surgery by [name].   I spoke to Dr. Morton Stall with [facility] on-call general surgery team.  He advised that there  are no beds available and therefore he cannot accept patient.  Patient can be placed on waiting list however bed is unlikely to become available until tomorrow morning to afternoon.  With no obvious evidence of feculent material coming from patient's wound or urinary tract will try to admit to hospitalist team for AKI with general surgery team seeing the patient while admitted.  If there is strong concern for emergent surgery would have patient transferred to [place] with voice factors at that time.  Patient was discussed with and evaluated by Dr. Armandina Gemma.  Patient care transferred to [place] at the end of my shift. Patient presentation, ED course, and plan of care discussed with review of all pertinent labs and imaging. Please see his/her note for further details regarding further ED course and disposition.         Final Clinical Impression(s) / ED Diagnoses Final diagnoses:  None    Rx / DC Orders ED Discharge Orders     None         Dyann Ruddle 01/24/22 2257    Regan Lemming, MD 01/25/22 1110

## 2022-01-24 NOTE — ED Notes (Signed)
IV infiltrated - induration palpated proximal to IV entry site-- IV has since been d/c'd - provider notified.  Pt oob to and from hall bathroom to empty ileostomy (observed light brown liquid stool).

## 2022-01-24 NOTE — Progress Notes (Signed)
Virtual Visit via Video Note  I connected with [name]  on 01/24/22 at 12:00 PM EDT by a video enabled telemedicine application and verified that I am speaking with the correct person using two identifiers.  Location patient: [place] Location provider:work or home office Persons participating in the virtual visit: patient, provider  I discussed the limitations and requested verbal permission for telemedicine visit. The patient expressed understanding and agreed to proceed.   HPI:  Acute telemedicine visit for abd and back pain: -Onset: 4 days ago -Symptoms include: pain in low back and groin/abd - "deep inside between scrotum and anus" - constant, R>L, low back pain - so bad since last night that he is not eating, felt like running a fever overnight, a little malaise, low grade temp this morning around 99.2, nausea -Denies: burning with urinated, vomiting, cough, congestion, rash, pus or leaking from the wound -Pertinent past medical history: see below, s/p ilium resection in April - 2 hospitalizations since for wound complications and dehydration, ileostomy, still has open wound - being packed twice weekly  -Pertinent medication allergies: Allergies  Allergen Reactions   Mercaptopurine Other (See Comments)    Pancreatitis    Shellfish Allergy Anaphylaxis    Throat started to close   Humira [Adalimumab]     Developed antibodies   Wound Dressing Adhesive Rash    Immunization History  Administered Date(s) Administered   Influenza Split 07/16/2012   Influenza,inj,Quad PF,6+ Mos 05/16/2013, 04/22/2014   Influenza-Unspecified 04/25/2019   Moderna Sars-Covid-2 Vaccination 11/03/2020   PFIZER(Purple Top)SARS-COV-2 Vaccination 08/22/2019, 09/12/2019, 03/24/2020   Td 07/17/1994     ROS: See pertinent positives and negatives per HPI.  Past Medical History:  Diagnosis Date   Acute prostatitis 07/24/2007   Qualifier: Diagnosis of  By: [name] MD, [name]    Allergy    mild   Arthritis     osteoarthritis   Asthma    Blood transfusion without reported diagnosis    BPH (benign prostatic hypertrophy) with urinary obstruction    Crohn's ileitis ([place]) suspected 05/03/2017   Dilated aortic root ([place])    noted on echo 08/2012   Diverticulitis of colon    EPIDIDYMITIS 02/15/2010   Qualifier: Diagnosis of  By: [name] MD, [name]    GERD (gastroesophageal reflux disease)    H/O: GI bleed    Hemorrhoids    Hepatitis 1975   unknown type    HERPES SIMPLEX INFECTION 10/14/2007   Qualifier: Diagnosis of  By: [name] MD, [name]    Hiatal hernia    Ileus following gastrointestinal surgery ([place]) 12/26/2011   Long term (current) use of systemic steroids 06/18/2018   Psoriasis    sees [name] at [facility].   Recurrent ventral incisional hernia 05/10/2012   SVT (supraventricular tachycardia) ([place])    Ulcer 08/21/2013   ileal    Past Surgical History:  Procedure Laterality Date   BOWEL RESECTION  12/19/2011   Procedure: SMALL BOWEL RESECTION;  Surgeon: [name], MD;  Location: [facility];  Service: General;  Laterality: N/A;  with anastamosis and insertion mesh   BRONCHOSCOPY     COLON SURGERY  01/2004   x 2   COLONOSCOPY W/ BIOPSIES  04/26/2017   per [name], no polyps, benign inflammation, repeat in 5 yrs    CYSTOSCOPY     ESOPHAGOGASTRODUODENOSCOPY     HEMICOLECTOMY     left side, at [facility], diverticulitis   [place]  8'1275 incisional hernia   ILEOSTOMY     ILEOSTOMY CLOSURE     INSERTION OF MESH  07/31/2012   Procedure: INSERTION OF MESH;  Surgeon: [name], MD;  Location: [facility];  Service: General;;   LAPAROTOMY  12/19/2011   Procedure: EXPLORATORY LAPAROTOMY;  Surgeon: [name], MD;  Location: [facility];  Service: General;  Laterality: N/A;   PACEMAKER IMPLANT N/A 12/01/2020   Procedure: PACEMAKER IMPLANT;  Surgeon: [name], MD;  Location: [place] [facility];  Service: Cardiovascular;   Laterality: N/A;   PACEMAKER INSERTION Left    TONSILLECTOMY     UPPER GASTROINTESTINAL ENDOSCOPY     VASECTOMY     VENTRAL HERNIA REPAIR  07/31/2012   Procedure: HERNIA REPAIR VENTRAL ADULT;  Surgeon: [name], MD;  Location: [facility];  Service: General;  Laterality: N/A;     Current Outpatient Medications:    albuterol (PROVENTIL HFA;VENTOLIN HFA) 108 (90 Base) MCG/ACT inhaler, INHALE 2 PUFFS INTO THE LUNGS EVERY 4 (FOUR) HOURS AS NEEDED FOR WHEEZING OR SHORTNESS OF BREATH., Disp: 8.5 Inhaler, Rfl: 2   diphenoxylate-atropine (LOMOTIL) 2.5-0.025 MG tablet, 1-2 tablets 4 times a day as neded, Disp: 90 tablet, Rfl: 1   EPINEPHrine (EPIPEN 2-PAK) 0.3 mg/0.3 mL IJ SOAJ injection, Inject 0.3 mLs (0.3 mg total) into the muscle as needed for anaphylaxis., Disp: 1 Device, Rfl: 0   fluticasone (CUTIVATE) 0.05 % cream, Apply 1 application topically 2 (two) times daily as needed for irritation., Disp: , Rfl:    halobetasol (ULTRAVATE) 0.05 % ointment, Apply topically 2 (two) times daily. (Patient taking differently: Apply 1 application  topically daily as needed (for psoriasis).), Disp: 50 g, Rfl: 5   hyoscyamine (LEVSIN SL) 0.125 MG SL tablet, PLACE 1 TABLET UNDER THE TONGUE EVERY 4 HOURS AS NEEDED FOR CRAMPING (URGENT DEFECATION) (Patient taking differently: Take 0.125 mg by mouth every 4 (four) hours as needed for cramping (for cramping (urgent defecation)).), Disp: 60 tablet, Rfl: 2   ketoconazole (NIZORAL) 2 % cream, APPLY TWICE A DAY AS NEEDED FOR FUNGAL INFECTION, Disp: 30 g, Rfl: 5   loperamide (IMODIUM) 2 MG capsule, Take 2 capsules (4 mg total) by mouth 3 (three) times daily before meals., Disp: 180 capsule, Rfl: 1   losartan (COZAAR) 25 MG tablet, Take 1 tablet (25 mg total) by mouth daily. Call and schedule follow up visit for further refills. [phone]. 2nd attempt, Disp: 15 tablet, Rfl: 0   mirtazapine (REMERON) 15 MG tablet, Take 1 tablet (15 mg total) by mouth at bedtime., Disp:  30 tablet, Rfl: 1   Multiple Vitamins-Minerals (MULTIVITAMIN WITH MINERALS) tablet, Take 1 tablet by mouth daily., Disp: , Rfl:    pantoprazole (PROTONIX) 40 MG tablet, Take 1 tablet (40 mg total) by mouth daily at 6 (six) AM., Disp: 30 tablet, Rfl: 1   pramoxine-hydrocortisone (ANALPRAM-HC) 1-1 % rectal cream, Place 1 application rectally 2 (two) times daily. Use twice daily before SITZ baths, Disp: 30 g, Rfl: 0   promethazine (PHENERGAN) 25 MG tablet, Take 1 tablet (25 mg total) by mouth every 6 (six) hours as needed for nausea or vomiting., Disp: 60 tablet, Rfl: 2   scopolamine (TRANSDERM-SCOP) 1 MG/3DAYS, Place 1 patch (1.5 mg total) onto the skin every 3 (three) days., Disp: 10 patch, Rfl: 1   tadalafil (CIALIS) 5 MG tablet, Take 1 tablet (5 mg total) by mouth daily., Disp: 90 tablet, Rfl: 3   vedolizumab (ENTYVIO) 300 MG injection, INFUSE 300  MG IV every 8 weeks (Patient taking differently: Inject 300 mg into the vein every 28 (twenty-eight) days. Every 4 weeks), Disp: 1 each, Rfl: 6  EXAM:  VITALS per patient if applicable: T 39.6 per pt  GENERAL: alert, oriented, appear to not feel well, no acute distress  HEENT: atraumatic, conjunttiva clear, no obvious abnormalities on inspection of external nose and ears  NECK: normal movements of the head and neck  LUNGS: on inspection no signs of respiratory distress, breathing rate appears normal, no obvious gross SOB, gasping or wheezing  CV: no obvious cyanosis  ABD: limited video visit exam - yellow stool in ileostomy bag, wound packed unmilical region with serosanguinous drainage on gauze - no purulence or edema/swelling around the wound, he reports pain is deep in RLQ/pelvic area  MS: moves all visible extremities without noticeable abnormality  PSYCH/NEURO: pleasant and cooperative, no obvious depression or anxiety, speech and thought processing grossly intact  ASSESSMENT AND PLAN:  Discussed the following assessment and  plan:  Right lower quadrant abdominal pain  Pelvic pain  Malaise  Subjective fever  -we discussed possible serious and likely etiologies, options for evaluation and workup, limitations of telemedicine visit vs in person visit, treatment, treatment risks and precautions. Pt is agreeable to treatment via telemedicine at this moment. However, given recent history and reported severity of discomfort advised referral for prompt inperson evaluation at higher level of care today as reports PCP office did not have openings today. He has opted to go [facility] or [facility] and declines assistance with transport. Agrees to go right away today.     I discussed the assessment and treatment plan with the patient. The patient was provided an opportunity to ask questions and all were answered. The patient agreed with the plan and demonstrated an understanding of the instructions.     Lucretia Kern, DO

## 2022-01-24 NOTE — ED Triage Notes (Signed)
Pt report pain with urination, increased urgency to void, low back pain and pain in his groin x 4 days.

## 2022-01-24 NOTE — ED Notes (Signed)
Patient transported to CT via stretcher escorted by [employer]

## 2022-01-24 NOTE — Patient Instructions (Addendum)
I feel that you need an inperson evaluation right away today for your symptoms. Please go to the medcenter today as we discussed for evaluation.   I hope you are feeling better soon!   It was nice to meet you today. I help [facility] out with telemedicine visits on Tuesdays and Thursdays and am happy to help if you need a virtual follow up visit on those days. Otherwise, if you have any concerns or questions following this visit please schedule a follow up visit with your Primary Care office or seek care at a local urgent care clinic to avoid delays in care. If you are having severe or life threatening symptoms please call 911 and/or go to the nearest emergency room.

## 2022-01-24 NOTE — ED Notes (Signed)
Pt now returned from [place] via stretcher.  Requesting pain med- reports perineum pain rated 6/10 described as sharp with associated dysuria.  Denies active nausea.

## 2022-01-25 ENCOUNTER — Other Ambulatory Visit (HOSPITAL_BASED_OUTPATIENT_CLINIC_OR_DEPARTMENT_OTHER): Payer: Self-pay

## 2022-01-25 DIAGNOSIS — R300 Dysuria: Secondary | ICD-10-CM | POA: Diagnosis not present

## 2022-01-25 LAB — URINE CULTURE: Culture: NO GROWTH

## 2022-01-25 MED ORDER — MORPHINE SULFATE (PF) 4 MG/ML IV SOLN
4.0000 mg | INTRAVENOUS | Status: DC | PRN
  Administered 2022-01-25 (×3): 4 mg via INTRAVENOUS
  Filled 2022-01-25 (×3): qty 1

## 2022-01-25 MED ORDER — OXYCODONE-ACETAMINOPHEN 5-325 MG PO TABS
1.0000 | ORAL_TABLET | Freq: Once | ORAL | Status: AC
  Administered 2022-01-25: 1 via ORAL
  Filled 2022-01-25: qty 1

## 2022-01-25 MED ORDER — PANTOPRAZOLE SODIUM 40 MG PO TBEC: 40.0000 mg | DELAYED_RELEASE_TABLET | Freq: Every day | ORAL | Status: DC

## 2022-01-25 MED ORDER — MIRTAZAPINE 15 MG PO TABS
15.0000 mg | ORAL_TABLET | Freq: Every day | ORAL | Status: DC
  Filled 2022-01-25: qty 1

## 2022-01-25 MED ORDER — TRAZODONE HCL 50 MG PO TABS
50.0000 mg | ORAL_TABLET | Freq: Every day | ORAL | Status: DC
  Administered 2022-01-25: 50 mg via ORAL
  Filled 2022-01-25: qty 1

## 2022-01-25 MED ORDER — LACTATED RINGERS IV BOLUS
1000.0000 mL | Freq: Once | INTRAVENOUS | Status: AC
  Administered 2022-01-25: 1000 mL via INTRAVENOUS

## 2022-01-25 MED ORDER — ALBUTEROL SULFATE HFA 108 (90 BASE) MCG/ACT IN AERS: 2.0000 | INHALATION_SPRAY | RESPIRATORY_TRACT | Status: DC | PRN

## 2022-01-25 MED ORDER — LACTATED RINGERS IV SOLN: INTRAVENOUS | Status: AC

## 2022-01-25 NOTE — ED Provider Notes (Signed)
Emergency Medicine Observation Re-evaluation Note  SHANTANU STRAUCH is a 71 y.o. male, seen on rounds today.  Pt initially presented to the ED for complaints of Dysuria, Back Pain, and Urinary Frequency Currently, the patient is resting comfortably, he has no complaints besides being anxious about when he will be transferred.  Physical Exam  BP 121/67   Pulse 71   Temp 97.7 F (36.5 C)   Resp 14   SpO2 98%  Physical Exam General: No acute distress Cardiac: Regular rate Lungs: No respiratory distress Psych: Calm  ED Course / MDM  EKG:   I have reviewed the labs performed to date as well as medications administered while in observation.  Recent changes in the last 24 hours include no new changes, patient awaiting bed at [place]  I discussed case with [name] at [facility].  The transfer team indicates that they should likely have a bed be available within the next hour, the general surgeon approves Mr. Lites getting that bad. At the request of our charge nurse, I have asked [facility] to also facilitate transfer once a bed is available.  Vss and wnl.   Marcelino Duster is not under involuntary commitment.     Varney Biles, MD 01/25/22 1742

## 2022-01-25 NOTE — ED Notes (Signed)
[facility] again and spoke to Tobias, part of Transfer. She stated that once a bed is available, then we will be contacted. Also stated that's all she can tell us.

## 2022-01-25 NOTE — ED Notes (Signed)
[facility] again, the representative of Transfers stated still waiting on available bed for patient. Once a bed is available, they will contact us.

## 2022-01-25 NOTE — ED Notes (Signed)
[facility]; spoke to [employer], representative of Transfers; stated that the patient is on the list waiting for a bed. Also stated that discharges should be happening around the 9:00 am hour, will give [place] a call once patient has a bed. Left the direct number to call; she will give the number to whomever the RN will be taking care of patient.

## 2022-01-25 NOTE — ED Notes (Signed)
[facility] back; the representative of the Transfer line stated that there's still no beds available at this time. Did not give a timeframe of when there will be an available bed for patient.

## 2022-01-25 NOTE — ED Provider Notes (Signed)
Care assumed from Dr. 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  Prior surgery had been done at [facility] and patient is on the waiting list to be transferred there.  Currently getting IV fluids.  If no bed available at [facility] after 10 hours of observation in the ED, will need to transfer to hospital-based emergency department for evaluation by general surgery.  Uneventful night.  Bed still not available [facility].  Case is signed out to Dr. Rogene Houston.   Delora Fuel, MD 09/62/83 [phone]

## 2022-01-25 NOTE — ED Notes (Signed)
Charge RN spoke to attending MD about this patient. MD requesting to speak with the surgical team to reassess. Spoke to a representative of the Transfer/Consult team. She stated that she will reconnect with a physician to speak with our attending MD.

## 2022-01-27 ENCOUNTER — Encounter (HOSPITAL_COMMUNITY): Payer: Self-pay | Admitting: Nurse Practitioner

## 2022-02-01 ENCOUNTER — Telehealth: Payer: Self-pay | Admitting: Family Medicine

## 2022-02-01 ENCOUNTER — Encounter: Payer: Self-pay | Admitting: Family Medicine

## 2022-02-01 ENCOUNTER — Ambulatory Visit (INDEPENDENT_AMBULATORY_CARE_PROVIDER_SITE_OTHER): Payer: Medicare Other | Admitting: Family Medicine

## 2022-02-01 VITALS — BP 130/80 | HR 94 | Temp 98.3°F | Wt 160.0 lb

## 2022-02-01 DIAGNOSIS — K6289 Other specified diseases of anus and rectum: Secondary | ICD-10-CM | POA: Diagnosis not present

## 2022-02-01 DIAGNOSIS — Z932 Ileostomy status: Secondary | ICD-10-CM

## 2022-02-01 DIAGNOSIS — K50918 Crohn's disease, unspecified, with other complication: Secondary | ICD-10-CM

## 2022-02-01 MED ORDER — HYDROCORTISONE ACETATE 25 MG RE SUPP: 25.0000 mg | Freq: Two times a day (BID) | RECTAL | 3 refills | Status: DC

## 2022-02-01 NOTE — Telephone Encounter (Signed)
Pt requests refill of hydrocortisone (ANUSOL-HC) 25 MG suppository [facility]  [id number] [place] [place]:  [phone] Fax:  [phone]

## 2022-02-01 NOTE — Progress Notes (Signed)
   Subjective:    Patient ID: [name], male    DOB: May 29, 1951, 71 y.o.   MRN: [id number]  HPI Here for several weeks of worsening perineal pain. He has had this off and on for years, but not usually this intense. He is urinating normally. No testicular pain. No fevers. 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 He then saw his GI surgeon at [facility], Dr. Renelda Mom, yesterday, and they apparently did not think he has any fistulas. They simply told him to drink plenty of fluids. He is current;ly scheduled for an ileostomy takedown at [facility] on 03-01-22. He is taking Nitrofurantoin daily for UTI prophylaxis. The pain he describes is directly around the anal area. Of course no stool passes through this area after the ileostomy.    Review of Systems  Constitutional: Negative.   Respiratory: Negative.    Cardiovascular: Negative.   Gastrointestinal:  Positive for abdominal pain and rectal pain. Negative for abdominal distention, anal bleeding, blood in stool, constipation, diarrhea, nausea and vomiting.  Genitourinary: Negative.        Objective:   Physical Exam Constitutional:      Comments: frail  Cardiovascular:     Rate and Rhythm: Normal rate and regular rhythm.     Pulses: Normal pulses.     Heart sounds: Normal heart sounds.  Pulmonary:     Effort: Pulmonary effort is normal.     Breath sounds: Normal breath sounds.  Abdominal:     General: Abdomen is flat. Bowel sounds are normal.     Palpations: Abdomen is soft. There is no mass.     Tenderness: There is no guarding or rebound.     Comments: Mild generalized tenderness   Genitourinary:    Prostate: Normal.     Comments: A digital rectal exam is very painful for him, the prostate itself is not tender  Neurological:     Mental Status: He is alert.           Assessment & Plan:  Proctitis, treat with hydrocortisone  25 mg suppositories BID. Follow up with Surgery as above. We spent a total of (33   ) minutes reviewing records and discussing these issues.  Alysia Penna, MD

## 2022-02-02 ENCOUNTER — Other Ambulatory Visit: Payer: Self-pay

## 2022-02-02 MED ORDER — HYDROCORTISONE ACETATE 25 MG RE SUPP: 25.0000 mg | Freq: Two times a day (BID) | RECTAL | 3 refills | Status: DC

## 2022-02-02 NOTE — Telephone Encounter (Signed)
Pt Rx has been sent

## 2022-02-07 ENCOUNTER — Encounter (HOSPITAL_COMMUNITY): Payer: Self-pay | Admitting: Nurse Practitioner

## 2022-02-11 ENCOUNTER — Encounter (HOSPITAL_BASED_OUTPATIENT_CLINIC_OR_DEPARTMENT_OTHER): Payer: Self-pay | Admitting: Emergency Medicine

## 2022-02-11 ENCOUNTER — Other Ambulatory Visit: Payer: Self-pay

## 2022-02-11 ENCOUNTER — Emergency Department (HOSPITAL_BASED_OUTPATIENT_CLINIC_OR_DEPARTMENT_OTHER)
Admission: EM | Admit: 2022-02-11 | Discharge: 2022-02-11 | Disposition: A | Discharge status: Home / Self Care | Payer: Medicare Other | Attending: Emergency Medicine | Admitting: Emergency Medicine

## 2022-02-11 DIAGNOSIS — E860 Dehydration: Secondary | ICD-10-CM | POA: Diagnosis present

## 2022-02-11 MED ORDER — HEPARIN SOD (PORK) LOCK FLUSH 100 UNIT/ML IV SOLN
500.0000 [IU] | Freq: Once | INTRAVENOUS | Status: AC
  Administered 2022-02-11: 500 [IU]
  Filled 2022-02-11: qty 5

## 2022-02-11 MED ORDER — LACTATED RINGERS IV BOLUS
1000.0000 mL | Freq: Once | INTRAVENOUS | Status: AC
  Administered 2022-02-11: 1000 mL via INTRAVENOUS

## 2022-02-11 NOTE — Discharge Instructions (Addendum)
I have attached information about dehydration.  It was a pleasure to meet you and I hope that you feel better  Return to the emergency department with any symptoms of dehydration such as dizziness, palpitations, feeling as though you may lose consciousness and blurred vision.  It was a pleasure to meet you and I hope that you feel better!

## 2022-02-11 NOTE — ED Notes (Signed)
PICC line placed last Wednesday, most recent dressing change and infusion through PICC line was yesterday at [facility]

## 2022-02-11 NOTE — ED Triage Notes (Signed)
Patient states he was discharged from the hospital yesterday and was scheduled to have a liter of LR today, but the home health nurse did not show up, so his surgeon told him to come to the closest ER for fluids. Patient has a PICC line in place.

## 2022-02-11 NOTE — ED Provider Notes (Signed)
[place] EMERGENCY DEPARTMENT Provider Note   CSN: [id number] Arrival date & time: 02/11/22  5883     History  Chief Complaint  Patient presents with   Dehydration    [name] is a 71 y.o. male with a past medical history of Crohn's colitis presenting today requesting IV fluids.  He was discharged from [facility] 2 days ago after having a Crohn's colitis flare and placement of an ostomy.  He reportedly had an outpatient home health scheduled to give him a daily liter of LR however today they did not show up.  He spoke with home health and they said that Medicare actually does not cover this service.  He is supposed to have his ostomy reversed this week but his surgeon says that he needs to stay hydrated until then.  He is here requesting fluids.    HPI     Home Medications Prior to Admission medications   Medication Sig Start Date End Date Taking? Authorizing Provider  albuterol (PROVENTIL HFA;VENTOLIN HFA) 108 (90 Base) MCG/ACT inhaler INHALE 2 PUFFS INTO THE LUNGS EVERY 4 (FOUR) HOURS AS NEEDED FOR WHEEZING OR SHORTNESS OF BREATH. 10/24/18   [name], MD  diphenoxylate-atropine (LOMOTIL) 2.5-0.025 MG tablet 1-2 tablets 4 times a day as neded 08/15/21   Gatha Mayer, MD  EPINEPHrine (EPIPEN 2-PAK) 0.3 mg/0.3 mL IJ SOAJ injection Inject 0.3 mLs (0.3 mg total) into the muscle as needed for anaphylaxis. 11/09/18   Sherwood Gambler, MD  fluticasone (CUTIVATE) 0.05 % cream Apply 1 application topically 2 (two) times daily as needed for irritation. 11/02/20   [provider]  halobetasol (ULTRAVATE) 0.05 % ointment Apply topically 2 (two) times daily. Patient taking differently: Apply 1 application  topically daily as needed (for psoriasis). 01/08/18   [name], MD  hydrocortisone (ANUSOL-HC) 25 MG suppository Place 1 suppository (25 mg total) rectally 2 (two) times daily. 02/02/22   [name], MD  hyoscyamine (LEVSIN SL) 0.125 MG SL tablet PLACE 1  TABLET UNDER THE TONGUE EVERY 4 HOURS AS NEEDED FOR CRAMPING (URGENT DEFECATION) Patient taking differently: Take 0.125 mg by mouth every 4 (four) hours as needed for cramping (for cramping (urgent defecation)). 08/15/21   Gatha Mayer, MD  ketoconazole (NIZORAL) 2 % cream APPLY TWICE A DAY AS NEEDED FOR FUNGAL INFECTION 01/18/22   [name], MD  loperamide (IMODIUM) 2 MG capsule Take 2 capsules (4 mg total) by mouth 3 (three) times daily before meals. 01/06/22   Eugenie Filler, MD  losartan (COZAAR) 25 MG tablet Take 1 tablet (25 mg total) by mouth daily. Call and schedule follow up visit for further refills. [phone]. 2nd attempt 01/09/22   Eugenie Filler, MD  mirtazapine (REMERON) 15 MG tablet Take 1 tablet (15 mg total) by mouth at bedtime. 01/18/22   Gatha Mayer, MD  Multiple Vitamins-Minerals (MULTIVITAMIN WITH MINERALS) tablet Take 1 tablet by mouth daily.    [provider]  pantoprazole (PROTONIX) 40 MG tablet Take 1 tablet (40 mg total) by mouth daily at 6 (six) AM. 01/07/22   Eugenie Filler, MD  pramoxine-hydrocortisone [facility]) 1-1 % rectal cream Place 1 application rectally 2 (two) times daily. Use twice daily before SITZ baths 04/21/19   Milus Banister, MD  promethazine (PHENERGAN) 25 MG tablet Take 1 tablet (25 mg total) by mouth every 6 (six) hours as needed for nausea or vomiting. 01/18/22   [name], MD  scopolamine (TRANSDERM-SCOP) 1 MG/3DAYS Place 1 patch (1.5 mg total) onto the skin every 3 (three) days. 01/08/22   Eugenie Filler, MD  tadalafil (CIALIS) 5 MG tablet Take 1 tablet (5 mg total) by mouth daily. 11/23/17   [name], MD  vedolizumab (ENTYVIO) 300 MG injection INFUSE 300 MG IV every 8 weeks Patient taking differently: Inject 300 mg into the vein every 28 (twenty-eight) days. Every 4 weeks 08/26/21   Gatha Mayer, MD      Allergies    Mercaptopurine, Shellfish allergy, Humira [adalimumab], and Wound dressing adhesive     Review of Systems   Review of Systems  Constitutional:  Positive for fatigue.  Cardiovascular:  Negative for palpitations.  Gastrointestinal:  Negative for abdominal pain and nausea.  Neurological:  Negative for dizziness and weakness.    Physical Exam Updated Vital Signs BP (!) 150/83 (BP Location: Left Arm)   Pulse 73   Temp 98.3 F (36.8 C)   Resp 20   Ht 5' 10"  (1.778 m)   Wt 73 kg   SpO2 99%   BMI 23.09 kg/m  Physical Exam Vitals and nursing note reviewed.  Constitutional:      Appearance: Normal appearance.  HENT:     Head: Normocephalic and atraumatic.     Mouth/Throat:     Mouth: Mucous membranes are moist.     Pharynx: Oropharynx is clear.  Eyes:     General: No scleral icterus.    Conjunctiva/sclera: Conjunctivae normal.  Pulmonary:     Effort: Pulmonary effort is normal. No respiratory distress.  Abdominal:     Comments: Patient with ostomy in place.  No signs of infection.  No erythema.  Stoma well-appearing.  Skin:    General: Skin is warm and dry.     Coloration: Skin is not jaundiced.     Findings: No rash.     Comments: PICC line in right upper extremity.  Neurological:     Mental Status: He is alert.  Psychiatric:        Mood and Affect: Mood normal.     ED Results / Procedures / Treatments   Labs (all labs ordered are listed, but only abnormal results are displayed) Labs Reviewed - No data to display  EKG None  Radiology No results found.  Procedures Procedures   Medications Ordered in ED Medications  lactated ringers bolus 1,000 mL (1,000 mLs Intravenous New Bag/Given 02/11/22 1852)    ED Course/ Medical Decision Making/ A&P                           Medical Decision Making  71 year old male presenting requesting a liter of LR.  He reportedly needs 1 every day until he can have his ostomy reversed later this week.  Home health said they would not cover it.  Past Medical History / Co-morbidities / Social History: Crohn's  colitis, ostomy and PICC line placement last week   Additional history: Per external chart review [facility].  He ended up having placement of an ostomy.   Physical Exam: Moist mucous membranes.  Skin with good turgor.  No jaundice.  RRR  Lab Tests: Considered however patient has no complaints.  Denies abdominal pain or nausea or any other concerns surrounding the ostomy placement.    Medications: I ordered medication including 1L LR    Disposition: 71 year old male presenting today requesting  IV rehydration.  He has no signs or symptoms of dehydration on exam or history taking.  At this time, I will give him the liter of LR and discharge him home.  I considered further lab work-up and admission since he believes he needs daily fluids however there is no indication based off of his presentation.  Return precautions were discussed and he is agreeable to discharge at this time.   I discussed this case with my attending physician Dr. Pearline Cables who cosigned this note including patient's presenting symptoms, physical exam, and planned diagnostics and interventions. Attending physician stated agreement with plan or made changes to plan which were implemented.     Final Clinical Impression(s) / ED Diagnoses Final diagnoses:  Dehydration    Rx / DC Orders ED Discharge Orders     None      Results and diagnoses were explained to the patient. Return precautions discussed in full. Patient had no additional questions and expressed complete understanding.   This chart was dictated using voice recognition software.  Despite best efforts to proofread,  errors can occur which can change the documentation meaning.    Rhae Hammock, PA-C 02/11/22 1908    Wynona Dove A, DO 02/14/22 0045

## 2022-02-12 ENCOUNTER — Emergency Department (HOSPITAL_BASED_OUTPATIENT_CLINIC_OR_DEPARTMENT_OTHER)
Admission: EM | Admit: 2022-02-12 | Discharge: 2022-02-12 | Disposition: A | Discharge status: Home / Self Care | Payer: Medicare Other | Attending: Emergency Medicine | Admitting: Emergency Medicine

## 2022-02-12 ENCOUNTER — Encounter (HOSPITAL_BASED_OUTPATIENT_CLINIC_OR_DEPARTMENT_OTHER): Payer: Self-pay | Admitting: Emergency Medicine

## 2022-02-12 DIAGNOSIS — E860 Dehydration: Secondary | ICD-10-CM | POA: Diagnosis present

## 2022-02-12 DIAGNOSIS — Z7952 Long term (current) use of systemic steroids: Secondary | ICD-10-CM | POA: Diagnosis not present

## 2022-02-12 DIAGNOSIS — Z0000 Encounter for general adult medical examination without abnormal findings: Secondary | ICD-10-CM | POA: Insufficient documentation

## 2022-02-12 DIAGNOSIS — Z950 Presence of cardiac pacemaker: Secondary | ICD-10-CM | POA: Diagnosis not present

## 2022-02-12 DIAGNOSIS — J45909 Unspecified asthma, uncomplicated: Secondary | ICD-10-CM | POA: Diagnosis not present

## 2022-02-12 MED ORDER — HEPARIN SOD (PORK) LOCK FLUSH 100 UNIT/ML IV SOLN
500.0000 [IU] | Freq: Once | INTRAVENOUS | Status: AC
  Administered 2022-02-12: 500 [IU]
  Filled 2022-02-12: qty 5

## 2022-02-12 MED ORDER — LACTATED RINGERS IV BOLUS
1000.0000 mL | Freq: Once | INTRAVENOUS | Status: AC
  Administered 2022-02-12: 1000 mL via INTRAVENOUS

## 2022-02-12 NOTE — ED Triage Notes (Signed)
Pt arrives pov, steady gait, reports that on call pcp at [facility] told pt to come to ED for dehydration. Pt tx for same last night

## 2022-02-12 NOTE — ED Notes (Signed)
Patient sitting up in bed, no distress noted. Denies any needs at present. Bed locked in lowest position with side rails raised x2. Call bell within reach.

## 2022-02-12 NOTE — ED Notes (Signed)
Dr Gray at bedside  

## 2022-02-12 NOTE — Discharge Instructions (Addendum)
It was a pleasure caring for you today in the emergency department.  Please return to the emergency department for any worsening or worrisome symptoms.  Please follow up with your surgeon

## 2022-02-12 NOTE — ED Provider Notes (Signed)
[place] EMERGENCY DEPARTMENT Provider Note   CSN: [id number] Arrival date & time: 02/12/22  1532     History  Chief Complaint  Patient presents with   Dehydration    [name] is a 71 y.o. male.  Patient as above with significant medical history as below, including small bowel resection with formation of ostomy, Crohn's ileitis, psoriasis, BPH who presents to the ED with complaint of dehydration.  Patient is scheduled to have closure of his ileostomy performed 8/2 at [facility], [name].  Patient was supposed to have his IV fluids given by home health nurse but they were a no-show so he reported to the emergency department yesterday for IV fluids after discussion with the surgical team.  Patient reports he is going to go to infusion center tomorrow for IV fluids but in the interim he has reports the ED for IV fluids.  Tolerant p.o. intake without difficulty.  No nausea or vomiting.  He is having significant mount of fluid out of his ostomy.  No significant discomfort to his ostomy site.  No change in urination.  No significant changes symptoms since his evaluation yesterday     Past Medical History:  Diagnosis Date   Acute prostatitis 07/24/2007   Qualifier: Diagnosis of  By: [name] MD, [name]    Allergy    mild   Arthritis    osteoarthritis   Asthma    Blood transfusion without reported diagnosis    BPH (benign prostatic hypertrophy) with urinary obstruction    Crohn's ileitis ([place]) suspected 05/03/2017   Dilated aortic root ([place])    noted on echo 08/2012   Diverticulitis of colon    EPIDIDYMITIS 02/15/2010   Qualifier: Diagnosis of  By: [name] MD, [name]    GERD (gastroesophageal reflux disease)    H/O: GI bleed    Hemorrhoids    Hepatitis 1975   unknown type    HERPES SIMPLEX INFECTION 10/14/2007   Qualifier: Diagnosis of  By: [name] MD, [name]    Hiatal hernia    Ileus following gastrointestinal surgery ([place]) 12/26/2011   Long term (current) use of  systemic steroids 06/18/2018   Psoriasis    sees [name] at [facility].   Recurrent ventral incisional hernia 05/10/2012   SVT (supraventricular tachycardia) ([place])    Ulcer 08/21/2013   ileal    Past Surgical History:  Procedure Laterality Date   BOWEL RESECTION  12/19/2011   Procedure: SMALL BOWEL RESECTION;  Surgeon: [name], MD;  Location: [facility];  Service: General;  Laterality: N/A;  with anastamosis and insertion mesh   BRONCHOSCOPY     COLON SURGERY  01/2004   x 2   COLONOSCOPY W/ BIOPSIES  04/26/2017   per [name], no polyps, benign inflammation, repeat in 5 yrs    CYSTOSCOPY     ESOPHAGOGASTRODUODENOSCOPY     HEMICOLECTOMY     left side, at [facility], diverticulitis   [place]     [phone] incisional hernia   ILEOSTOMY     ILEOSTOMY CLOSURE     INSERTION OF MESH  07/31/2012   Procedure: INSERTION OF MESH;  Surgeon: [name], MD;  Location: [facility];  Service: General;;   LAPAROTOMY  12/19/2011   Procedure: EXPLORATORY LAPAROTOMY;  Surgeon: [name], MD;  Location: [facility];  Service: General;  Laterality: N/A;   PACEMAKER IMPLANT N/A 12/01/2020   Procedure: PACEMAKER IMPLANT;  Surgeon: [name], MD;  Location: [facility];  Service: Cardiovascular;  Laterality: N/A;   PACEMAKER INSERTION Left    TONSILLECTOMY     UPPER GASTROINTESTINAL ENDOSCOPY     VASECTOMY     VENTRAL HERNIA REPAIR  07/31/2012   Procedure: HERNIA REPAIR VENTRAL ADULT;  Surgeon: [name], MD;  Location: [facility];  Service: General;  Laterality: N/A;     HPI     Home Medications Prior to Admission medications   Medication Sig Start Date End Date Taking? Authorizing Provider  albuterol (PROVENTIL HFA;VENTOLIN HFA) 108 (90 Base) MCG/ACT inhaler INHALE 2 PUFFS INTO THE LUNGS EVERY 4 (FOUR) HOURS AS NEEDED FOR WHEEZING OR SHORTNESS OF BREATH. 10/24/18   [name], MD  diphenoxylate-atropine (LOMOTIL)  2.5-0.025 MG tablet 1-2 tablets 4 times a day as neded 08/15/21   Gatha Mayer, MD  EPINEPHrine (EPIPEN 2-PAK) 0.3 mg/0.3 mL IJ SOAJ injection Inject 0.3 mLs (0.3 mg total) into the muscle as needed for anaphylaxis. 11/09/18   Sherwood Gambler, MD  fluticasone (CUTIVATE) 0.05 % cream Apply 1 application topically 2 (two) times daily as needed for irritation. 11/02/20   [provider]  halobetasol (ULTRAVATE) 0.05 % ointment Apply topically 2 (two) times daily. Patient taking differently: Apply 1 application  topically daily as needed (for psoriasis). 01/08/18   [name], MD  hydrocortisone (ANUSOL-HC) 25 MG suppository Place 1 suppository (25 mg total) rectally 2 (two) times daily. 02/02/22   [name], MD  hyoscyamine (LEVSIN SL) 0.125 MG SL tablet PLACE 1 TABLET UNDER THE TONGUE EVERY 4 HOURS AS NEEDED FOR CRAMPING (URGENT DEFECATION) Patient taking differently: Take 0.125 mg by mouth every 4 (four) hours as needed for cramping (for cramping (urgent defecation)). 08/15/21   Gatha Mayer, MD  ketoconazole (NIZORAL) 2 % cream APPLY TWICE A DAY AS NEEDED FOR FUNGAL INFECTION 01/18/22   [name], MD  loperamide (IMODIUM) 2 MG capsule Take 2 capsules (4 mg total) by mouth 3 (three) times daily before meals. 01/06/22   Eugenie Filler, MD  losartan (COZAAR) 25 MG tablet Take 1 tablet (25 mg total) by mouth daily. Call and schedule follow up visit for further refills. [phone]. 2nd attempt 01/09/22   Eugenie Filler, MD  mirtazapine (REMERON) 15 MG tablet Take 1 tablet (15 mg total) by mouth at bedtime. 01/18/22   Gatha Mayer, MD  Multiple Vitamins-Minerals (MULTIVITAMIN WITH MINERALS) tablet Take 1 tablet by mouth daily.    [provider]  pantoprazole (PROTONIX) 40 MG tablet Take 1 tablet (40 mg total) by mouth daily at 6 (six) AM. 01/07/22   Eugenie Filler, MD  pramoxine-hydrocortisone [facility]) 1-1 % rectal cream Place 1 application rectally 2 (two)  times daily. Use twice daily before SITZ baths 04/21/19   Milus Banister, MD  promethazine (PHENERGAN) 25 MG tablet Take 1 tablet (25 mg total) by mouth every 6 (six) hours as needed for nausea or vomiting. 01/18/22   [name], MD  scopolamine (TRANSDERM-SCOP) 1 MG/3DAYS Place 1 patch (1.5 mg total) onto the skin every 3 (three) days. 01/08/22   Eugenie Filler, MD  tadalafil (CIALIS) 5 MG tablet Take 1 tablet (5 mg total) by mouth daily. 11/23/17   [name], MD  vedolizumab (ENTYVIO) 300 MG injection INFUSE 300 MG IV every 8 weeks Patient taking differently: Inject 300 mg into the vein every 28 (twenty-eight) days. Every 4 weeks 08/26/21  Gatha Mayer, MD      Allergies    Mercaptopurine, Shellfish allergy, Humira [adalimumab], and Wound dressing adhesive    Review of Systems   Review of Systems  Constitutional:  Negative for chills and fever.  HENT:  Negative for facial swelling and trouble swallowing.   Eyes:  Negative for photophobia and visual disturbance.  Respiratory:  Negative for cough and shortness of breath.   Cardiovascular:  Negative for chest pain and palpitations.  Gastrointestinal:  Negative for abdominal pain, nausea and vomiting.  Endocrine: Negative for polydipsia and polyuria.  Genitourinary:  Negative for difficulty urinating and hematuria.  Musculoskeletal:  Negative for gait problem and joint swelling.  Skin:  Negative for pallor and rash.  Neurological:  Negative for syncope and headaches.  Psychiatric/Behavioral:  Negative for agitation and confusion.     Physical Exam Updated Vital Signs BP 120/76 (BP Location: Left Arm)   Pulse 88   Temp 98.2 F (36.8 C) (Oral)   Resp 17   Ht 5' 10"  (1.778 m)   Wt 72.6 kg   SpO2 96%   BMI 22.96 kg/m  Physical Exam Vitals and nursing note reviewed.  Constitutional:      General: He is not in acute distress.    Appearance: He is well-developed.  HENT:     Head: Normocephalic and atraumatic.      Right Ear: External ear normal.     Left Ear: External ear normal.     Mouth/Throat:     Mouth: Mucous membranes are moist.  Eyes:     General: No scleral icterus. Cardiovascular:     Rate and Rhythm: Normal rate and regular rhythm.     Pulses: Normal pulses.     Heart sounds: Normal heart sounds.  Pulmonary:     Effort: Pulmonary effort is normal. No respiratory distress.     Breath sounds: Normal breath sounds.  Abdominal:     General: Abdomen is flat.     Palpations: Abdomen is soft.     Tenderness: There is no abdominal tenderness. There is no guarding or rebound.    Musculoskeletal:        General: Normal range of motion.       Arms:     Cervical back: Normal range of motion.     Right lower leg: No edema.     Left lower leg: No edema.  Skin:    General: Skin is warm and dry.     Capillary Refill: Capillary refill takes less than 2 seconds.  Neurological:     Mental Status: He is alert and oriented to person, place, and time.     GCS: GCS eye subscore is 4. GCS verbal subscore is 5. GCS motor subscore is 6.  Psychiatric:        Mood and Affect: Mood normal.        Behavior: Behavior normal.     ED Results / Procedures / Treatments   Labs (all labs ordered are listed, but only abnormal results are displayed) Labs Reviewed - No data to display  EKG None  Radiology No results found.  Procedures Procedures    Medications Ordered in ED Medications  lactated ringers bolus 1,000 mL (1,000 mLs Intravenous New Bag/Given 02/12/22 1717)    ED Course/ Medical Decision Making/ A&P                           Medical Decision Making  CC: Requesting IV fluids  This patient presents to the Emergency Department for the above complaint. This involves an extensive number of treatment options and is a complaint that carries with it a high risk of complications and morbidity. Vital signs were reviewed. Serious etiologies considered.  Record review:  Previous records  obtained and reviewed prior ED visits, prior labs and imaging  Additional history obtained from N/A  Medical and surgical history as noted above.   Work up as above, notable for:  Labs & imaging results that were available during my care of the patient were visualized by me and considered in my medical decision making.  Physical exam as above.  Has no significant external evidence of dehydration.  He is having output in his ostomy.  Mucous membranes are moist.  HDS.  Neurologically intact.  Abdomen soft, nontender, nonperitoneal.   Management: Patient provided IV fluids, 1 L of LR  ED Course:     Reassessment:  Asymptomatic  Admission was considered.   Advised patient follow-up with his infusion center tomorrow for IV fluids as needed.  Follow-up with surgery.  The patient improved significantly and was discharged in stable condition. Detailed discussions were had with the patient regarding current findings, and need for close f/u with PCP or on call doctor. The patient has been instructed to return immediately if the symptoms worsen in any way for re-evaluation. Patient verbalized understanding and is in agreement with current care plan. All questions answered prior to discharge.              Social determinants of health include -  Social History   Socioeconomic History   Marital status: Married    Spouse name: Not on file   Number of children: 2   Years of education: Not on file   Highest education level: Not on file  Occupational History   Occupation: EHS manager    Employer: [employer]  Tobacco Use   Smoking status: Never   Smokeless tobacco: Never  Vaping Use   Vaping Use: Never used  Substance and Sexual Activity   Alcohol use: Yes    Alcohol/week: 0.0 standard drinks of alcohol    Comment: occ   Drug use: No   Sexual activity: Not on file  Other Topics Concern   Not on file  Social History Narrative   He is married with 2 sons 1 son is an  Art gallery manager and 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  Despite best efforts to proofread,  errors can occur which can change the documentation meaning.         Final Clinical Impression(s) / ED Diagnoses Final diagnoses:  Physical exam    Rx / DC Orders ED Discharge Orders     None         Jeanell Sparrow, DO 02/12/22 1809

## 2022-02-13 ENCOUNTER — Ambulatory Visit (INDEPENDENT_AMBULATORY_CARE_PROVIDER_SITE_OTHER): Payer: Medicare Other

## 2022-02-13 ENCOUNTER — Other Ambulatory Visit: Payer: Self-pay

## 2022-02-13 VITALS — BP 140/76 | HR 77 | Temp 98.1°F | Resp 18 | Ht 70.0 in | Wt 161.2 lb

## 2022-02-13 DIAGNOSIS — K50018 Crohn's disease of small intestine with other complication: Secondary | ICD-10-CM

## 2022-02-13 DIAGNOSIS — E860 Dehydration: Secondary | ICD-10-CM | POA: Diagnosis not present

## 2022-02-13 MED ORDER — HEPARIN SOD (PORK) LOCK FLUSH 100 UNIT/ML IV SOLN
250.0000 [IU] | Freq: Once | INTRAVENOUS | Status: AC | PRN
  Administered 2022-02-13: 250 [IU]
  Filled 2022-02-13: qty 5

## 2022-02-13 MED ORDER — LACTATED RINGERS IV BOLUS
1000.0000 mL | Freq: Once | INTRAVENOUS | Status: AC
  Administered 2022-02-13: 1000 mL via INTRAVENOUS
  Filled 2022-02-13: qty 1000

## 2022-02-13 MED ORDER — SODIUM CHLORIDE 0.9% FLUSH: 10.0000 mL | Freq: Once | INTRAVENOUS | Status: DC | PRN

## 2022-02-13 NOTE — Progress Notes (Signed)
Diagnosis: Crohn's Disease  Provider:  Marshell Garfinkel, MD  Procedure: Infusion  IV Type: PICC, IV Location: R Upper Arm  Lactated Ringers, Dose: 1000 ml  Infusion Start Time: 5041  Infusion Stop Time: 3643  Post Infusion IV Care: PICC Line Flushed/Capped  Discharge: Condition: Good, Destination: Home . AVS provided to patient.   Performed by:  Arnoldo Morale, RN

## 2022-02-14 ENCOUNTER — Ambulatory Visit (INDEPENDENT_AMBULATORY_CARE_PROVIDER_SITE_OTHER): Payer: Medicare Other

## 2022-02-14 VITALS — BP 148/83 | HR 63 | Temp 98.0°F | Resp 18 | Ht 70.0 in | Wt 160.4 lb

## 2022-02-14 DIAGNOSIS — E860 Dehydration: Secondary | ICD-10-CM

## 2022-02-14 DIAGNOSIS — K50018 Crohn's disease of small intestine with other complication: Secondary | ICD-10-CM

## 2022-02-14 MED ORDER — LACTATED RINGERS IV BOLUS
1000.0000 mL | Freq: Once | INTRAVENOUS | Status: AC
  Administered 2022-02-14: 1000 mL via INTRAVENOUS
  Filled 2022-02-14: qty 1000

## 2022-02-14 NOTE — Progress Notes (Signed)
Diagnosis: Crohn's Disease  Provider:  Marshell Garfinkel, MD  Procedure: Infusion  IV Type: PICC, IV Location: R Upper Arm  Lactated Ringers, Dose: 1000 ml  Infusion Start Time: 1301  Infusion Stop Time: 5379  Post Infusion IV Care: PICC Line Flushed/Capped  Discharge: Condition: Good, Destination: Home . AVS provided to patient.   Performed by:  Arnoldo Morale, RN

## 2022-02-16 ENCOUNTER — Telehealth: Payer: Self-pay | Admitting: Family Medicine

## 2022-02-16 NOTE — Telephone Encounter (Signed)
Left message for patient to call back and schedule Medicare Annual Wellness Visit (AWV) either virtually or in office. Left  my Herbie Drape number [phone]   awvi 09/14/21 per [facility]   please schedule at anytime with [facility] Nurse Health Advisor 1 or 2   This should be a 45 minute visit.

## 2022-02-21 ENCOUNTER — Telehealth: Payer: Self-pay

## 2022-02-21 NOTE — Telephone Encounter (Signed)
     Patient  visit on 7/31  at [facility]    Transferred to [facility] for surgery  Have you been able to follow up with your primary care physician? yes  The patient was or was not able to obtain any needed medicine or equipment. yes  Are there diet recommendations that you are having difficulty following? na  Patient expresses understanding of discharge instructions and education provided has no other needs at this time. Yes   [place], Care Management  [phone] [address], [place], [place] [address] Phone: [phone] Email: [email or web address]

## 2022-03-09 ENCOUNTER — Telehealth: Payer: Self-pay | Admitting: Family Medicine

## 2022-03-09 NOTE — Telephone Encounter (Signed)
Left message for patient to call back and schedule Medicare Annual Wellness Visit (AWV) either virtually or in office. Left  my Herbie Drape number [phone]   awvi 09/14/21 per [facility]  please schedule at anytime with [facility] Nurse Health Advisor 1 or 2   This should be a 45 minute visit.

## 2022-03-13 ENCOUNTER — Emergency Department (HOSPITAL_COMMUNITY)
Admission: EM | Admit: 2022-03-13 | Discharge: 2022-03-13 | Disposition: A | Discharge status: Other Acute Inpatient Hospital | Payer: Medicare Other | Attending: Emergency Medicine | Admitting: Emergency Medicine

## 2022-03-13 ENCOUNTER — Other Ambulatory Visit: Payer: Self-pay

## 2022-03-13 ENCOUNTER — Encounter (HOSPITAL_COMMUNITY): Payer: Self-pay

## 2022-03-13 ENCOUNTER — Emergency Department (HOSPITAL_COMMUNITY): Payer: Medicare Other

## 2022-03-13 DIAGNOSIS — Z79899 Other long term (current) drug therapy: Secondary | ICD-10-CM | POA: Insufficient documentation

## 2022-03-13 DIAGNOSIS — K5090 Crohn's disease, unspecified, without complications: Secondary | ICD-10-CM | POA: Diagnosis not present

## 2022-03-13 DIAGNOSIS — I700 Atherosclerosis of aorta: Secondary | ICD-10-CM | POA: Diagnosis not present

## 2022-03-13 DIAGNOSIS — K668 Other specified disorders of peritoneum: Secondary | ICD-10-CM

## 2022-03-13 DIAGNOSIS — R42 Dizziness and giddiness: Secondary | ICD-10-CM | POA: Insufficient documentation

## 2022-03-13 LAB — CBC WITH DIFFERENTIAL/PLATELET
Abs Immature Granulocytes: 0.03 10*3/uL (ref 0.00–0.07)
Basophils Absolute: 0 10*3/uL (ref 0.0–0.1)
Basophils Relative: 0 %
Eosinophils Absolute: 0.1 10*3/uL (ref 0.0–0.5)
Eosinophils Relative: 1 %
HCT: 34.2 % — ABNORMAL LOW (ref 39.0–52.0)
Hemoglobin: 10.3 g/dL — ABNORMAL LOW (ref 13.0–17.0)
Immature Granulocytes: 0 %
Lymphocytes Relative: 21 %
Lymphs Abs: 2.2 10*3/uL (ref 0.7–4.0)
MCH: 24.1 pg — ABNORMAL LOW (ref 26.0–34.0)
MCHC: 30.1 g/dL (ref 30.0–36.0)
MCV: 79.9 fL — ABNORMAL LOW (ref 80.0–100.0)
Monocytes Absolute: 0.8 10*3/uL (ref 0.1–1.0)
Monocytes Relative: 8 %
Neutro Abs: 7 10*3/uL (ref 1.7–7.7)
Neutrophils Relative %: 70 %
Platelets: 409 10*3/uL — ABNORMAL HIGH (ref 150–400)
RBC: 4.28 MIL/uL (ref 4.22–5.81)
RDW: 14.6 % (ref 11.5–15.5)
WBC: 10.2 10*3/uL (ref 4.0–10.5)
nRBC: 0 % (ref 0.0–0.2)

## 2022-03-13 LAB — COMPREHENSIVE METABOLIC PANEL
ALT: 10 U/L (ref 0–44)
AST: 16 U/L (ref 15–41)
Albumin: 3.6 g/dL (ref 3.5–5.0)
Alkaline Phosphatase: 101 U/L (ref 38–126)
Anion gap: 11 (ref 5–15)
BUN: 23 mg/dL (ref 8–23)
CO2: 30 mmol/L (ref 22–32)
Calcium: 9.4 mg/dL (ref 8.9–10.3)
Chloride: 98 mmol/L (ref 98–111)
Creatinine, Ser: 1.58 mg/dL — ABNORMAL HIGH (ref 0.61–1.24)
GFR, Estimated: 47 mL/min — ABNORMAL LOW (ref >60)
Glucose, Bld: 101 mg/dL — ABNORMAL HIGH (ref 70–99)
Potassium: 4 mmol/L (ref 3.5–5.1)
Sodium: 139 mmol/L (ref 135–145)
Total Bilirubin: 0.6 mg/dL (ref 0.3–1.2)
Total Protein: 8.1 g/dL (ref 6.5–8.1)

## 2022-03-13 LAB — URINALYSIS, ROUTINE W REFLEX MICROSCOPIC
Bilirubin Urine: NEGATIVE
Glucose, UA: NEGATIVE mg/dL
Hgb urine dipstick: NEGATIVE
Ketones, ur: 5 mg/dL — AB
Leukocytes,Ua: NEGATIVE
Nitrite: NEGATIVE
Protein, ur: NEGATIVE mg/dL
Specific Gravity, Urine: >1.046 — ABNORMAL HIGH (ref 1.005–1.030)
pH: 6 (ref 5.0–8.0)

## 2022-03-13 LAB — PHOSPHORUS: Phosphorus: 3.4 mg/dL (ref 2.5–4.6)

## 2022-03-13 LAB — MAGNESIUM: Magnesium: 1.9 mg/dL (ref 1.7–2.4)

## 2022-03-13 LAB — TROPONIN I (HIGH SENSITIVITY): Troponin I (High Sensitivity): 4 ng/L (ref <18)

## 2022-03-13 LAB — LIPASE, BLOOD: Lipase: 75 U/L — ABNORMAL HIGH (ref 11–51)

## 2022-03-13 MED ORDER — ONDANSETRON HCL 4 MG/2ML IJ SOLN
4.0000 mg | Freq: Once | INTRAMUSCULAR | Status: AC
  Administered 2022-03-13: 4 mg via INTRAVENOUS
  Filled 2022-03-13: qty 2

## 2022-03-13 MED ORDER — MORPHINE SULFATE (PF) 4 MG/ML IV SOLN
4.0000 mg | Freq: Once | INTRAVENOUS | Status: AC
  Administered 2022-03-13: 4 mg via INTRAVENOUS
  Filled 2022-03-13: qty 1

## 2022-03-13 MED ORDER — IOHEXOL 300 MG/ML  SOLN
100.0000 mL | Freq: Once | INTRAMUSCULAR | Status: AC | PRN
  Administered 2022-03-13: 100 mL via INTRAVENOUS

## 2022-03-13 MED ORDER — VANCOMYCIN HCL 1500 MG/300ML IV SOLN
1500.0000 mg | Freq: Once | INTRAVENOUS | Status: AC
  Administered 2022-03-13: 1500 mg via INTRAVENOUS
  Filled 2022-03-13: qty 300

## 2022-03-13 MED ORDER — MORPHINE SULFATE (PF) 4 MG/ML IV SOLN
4.0000 mg | INTRAVENOUS | Status: DC | PRN
  Administered 2022-03-13 (×2): 4 mg via INTRAVENOUS
  Filled 2022-03-13 (×2): qty 1

## 2022-03-13 MED ORDER — ONDANSETRON HCL 4 MG/2ML IJ SOLN
4.0000 mg | Freq: Four times a day (QID) | INTRAMUSCULAR | Status: DC | PRN
  Administered 2022-03-13 (×2): 4 mg via INTRAVENOUS
  Filled 2022-03-13 (×2): qty 2

## 2022-03-13 MED ORDER — SODIUM CHLORIDE (PF) 0.9 % IJ SOLN
INTRAMUSCULAR | Status: AC
  Administered 2022-03-13: 10 mL
  Filled 2022-03-13: qty 50

## 2022-03-13 MED ORDER — SODIUM CHLORIDE 0.9 % IV SOLN: INTRAVENOUS | Status: DC

## 2022-03-13 MED ORDER — PIPERACILLIN-TAZOBACTAM 3.375 G IVPB 30 MIN
3.3750 g | Freq: Once | INTRAVENOUS | Status: AC
  Administered 2022-03-13: 3.375 g via INTRAVENOUS
  Filled 2022-03-13: qty 50

## 2022-03-13 NOTE — ED Notes (Signed)
Pt is a very difficult stick. Unable to collect blood cultures prior to starting antibiotics.

## 2022-03-13 NOTE — ED Notes (Addendum)
Report called and given to [facility] RN at [facility]. [phone]) Room [phone]

## 2022-03-13 NOTE — ED Notes (Signed)
Pt reports nausea since April of 2023 worse in the last few weeks.

## 2022-03-13 NOTE — ED Provider Notes (Signed)
[facility] DEPT Provider Note   CSN: [id number] Arrival date & time: 03/13/22  4163     History  Chief Complaint  Patient presents with   Dizziness    [name] is a 71 y.o. male.  Patient is a 71 year old male with a past medical history of Crohn's status post multiple bowel resections presenting to the emergency department with abdominal pain and generalized weakness.  The patient states that he had a surgery on August 2 to have part of his ileum removed as well as 2 fistulas.  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  He states that he went to rehab postop and was discharged to home on Friday.  He states throughout the weekend he has had increasing weakness and lightheadedness upon standing.  He states he has had decreased appetite.  He states that he has had increased watery output from his ileostomy.  He denies any fevers or chills.  He states he has been having abdominal pain around the ileostomy site.  He states he has had nausea with intermittent vomiting.  He denies any black or bloody stool.  He denies any dysuria or hematuria.  The history is provided by the patient.  Dizziness      Home Medications Prior to Admission medications   Medication Sig Start Date End Date Taking? Authorizing Provider  albuterol (PROVENTIL HFA;VENTOLIN HFA) 108 (90 Base) MCG/ACT inhaler INHALE 2 PUFFS INTO THE LUNGS EVERY 4 (FOUR) HOURS AS NEEDED FOR WHEEZING OR SHORTNESS OF BREATH. 10/24/18   [name], MD  diphenoxylate-atropine (LOMOTIL) 2.5-0.025 MG tablet 1-2 tablets 4 times a day as neded 08/15/21   Gatha Mayer, MD  EPINEPHrine (EPIPEN 2-PAK) 0.3 mg/0.3 mL IJ SOAJ injection Inject 0.3 mLs (0.3 mg total) into the muscle as needed for anaphylaxis. 11/09/18   Sherwood Gambler, MD  fluticasone (CUTIVATE) 0.05 % cream Apply 1 application topically 2 (two) times daily as needed for irritation. 11/02/20   [provider]   halobetasol (ULTRAVATE) 0.05 % ointment Apply topically 2 (two) times daily. Patient taking differently: Apply 1 application  topically daily as needed (for psoriasis). 01/08/18   [name], MD  hydrocortisone (ANUSOL-HC) 25 MG suppository Place 1 suppository (25 mg total) rectally 2 (two) times daily. 02/02/22   [name], MD  hyoscyamine (LEVSIN SL) 0.125 MG SL tablet PLACE 1 TABLET UNDER THE TONGUE EVERY 4 HOURS AS NEEDED FOR CRAMPING (URGENT DEFECATION) Patient taking differently: Take 0.125 mg by mouth every 4 (four) hours as needed for cramping (for cramping (urgent defecation)). 08/15/21   Gatha Mayer, MD  ketoconazole (NIZORAL) 2 % cream APPLY TWICE A DAY AS NEEDED FOR FUNGAL INFECTION 01/18/22   [name], MD  loperamide (IMODIUM) 2 MG capsule Take 2 capsules (4 mg total) by mouth 3 (three) times daily before meals. 01/06/22   Eugenie Filler, MD  losartan (COZAAR) 25 MG tablet Take 1 tablet (25 mg total) by mouth daily. Call and schedule follow up visit for further refills. [phone]. 2nd attempt 01/09/22   Eugenie Filler, MD  mirtazapine (REMERON) 15 MG tablet Take 1 tablet (15 mg total) by mouth at bedtime. 01/18/22   Gatha Mayer, MD  Multiple Vitamins-Minerals (MULTIVITAMIN WITH MINERALS) tablet Take 1 tablet by mouth daily.    [provider]  pantoprazole (PROTONIX) 40 MG tablet Take 1 tablet (40 mg total) by mouth daily at 6 (six) AM. 01/07/22  Eugenie Filler, MD  pramoxine-hydrocortisone [facility]) 1-1 % rectal cream Place 1 application rectally 2 (two) times daily. Use twice daily before SITZ baths 04/21/19   Milus Banister, MD  promethazine (PHENERGAN) 25 MG tablet Take 1 tablet (25 mg total) by mouth every 6 (six) hours as needed for nausea or vomiting. 01/18/22   [name], MD  scopolamine (TRANSDERM-SCOP) 1 MG/3DAYS Place 1 patch (1.5 mg total) onto the skin every 3 (three) days. 01/08/22   Eugenie Filler, MD  tadalafil (CIALIS) 5  MG tablet Take 1 tablet (5 mg total) by mouth daily. 11/23/17   [name], MD  vedolizumab (ENTYVIO) 300 MG injection INFUSE 300 MG IV every 8 weeks Patient taking differently: Inject 300 mg into the vein every 28 (twenty-eight) days. Every 4 weeks 08/26/21   Gatha Mayer, MD      Allergies    Mercaptopurine, Shellfish allergy, Humira [adalimumab], and Wound dressing adhesive    Review of Systems   Review of Systems  Neurological:  Positive for dizziness.    Physical Exam Updated Vital Signs BP 117/77   Pulse 77   Temp 97.9 F (36.6 C) (Oral)   Resp 18   SpO2 97%  Physical Exam Vitals and nursing note reviewed.  Constitutional:      General: He is not in acute distress.    Appearance: Normal appearance.  HENT:     Head: Normocephalic and atraumatic.     Nose: Nose normal.     Mouth/Throat:     Mouth: Mucous membranes are moist.     Pharynx: Oropharynx is clear.  Eyes:     Extraocular Movements: Extraocular movements intact.     Conjunctiva/sclera: Conjunctivae normal.  Cardiovascular:     Rate and Rhythm: Normal rate and regular rhythm.     Pulses: Normal pulses.     Heart sounds: Normal heart sounds.  Pulmonary:     Effort: Pulmonary effort is normal.     Breath sounds: Normal breath sounds.  Abdominal:     General: Abdomen is flat.     Palpations: Abdomen is soft.     Comments: Diffusely tender to palpation with voluntary guarding, no rebound, ileostomy site clean and dry without surrounding erythema or warmth, watery brown output in the bag  Musculoskeletal:        General: Normal range of motion.     Cervical back: Normal range of motion and neck supple.     Right lower leg: No edema.     Left lower leg: No edema.  Skin:    General: Skin is warm and dry.  Neurological:     General: No focal deficit present.     Mental Status: He is alert and oriented to person, place, and time.  Psychiatric:        Mood and Affect: Mood normal.        Behavior:  Behavior normal.     ED Results / Procedures / Treatments   Labs (all labs ordered are listed, but only abnormal results are displayed) Labs Reviewed  COMPREHENSIVE METABOLIC PANEL  LIPASE, BLOOD  CBC WITH DIFFERENTIAL/PLATELET  URINALYSIS, ROUTINE W REFLEX MICROSCOPIC  MAGNESIUM  PHOSPHORUS  TROPONIN I (HIGH SENSITIVITY)    EKG None  Radiology No results found.  Procedures Procedures    Medications Ordered in ED Medications  morphine (PF) 4 MG/ML injection 4 mg (has no administration in time range)  ondansetron (ZOFRAN) injection 4 mg (has no administration in  time range)    ED Course/ Medical Decision Making/ A&P Clinical Course as of 03/13/22 1311  Mon Mar 13, 2022  0917 Labs reviewed and interpreted by myself, patient's creatinine and hemoglobin are at his baseline.  He has decreased elevation to his lipase.  He has no significant signs of dehydration or electrolyte abnormality.  His CT and urine are pending at this time. [VK]  2505 Patient's CT shows evidence of free air in the abdomen, concerning for anastomotic or fistula leak.  CT also concerning for possible abscess versus hematoma in the subcutaneous abdominal tissue.  Upon reassessment, the patient is awake and alert and well-appearing.  He states initially morphine improved his pain but his pain is starting to return.  He states his nausea has resolved.  His abdomen remains soft and mildly diffusely tender.  He does have an approximately 2 cm area of erythema but no palpable fluctuance of the abdominal wall.  His surgeon, [name] will be called at [facility] for further recommendations. [VK]  45 Information regarding the patient was provided to [name], pending call back from [name] at this time. Patient remains stable. [VK]  1226 Patient remains stable pending call back from [name]. We will reach out again. [VK]  35 I spoke with Dr. Judi Cong, colorectal surgeon on call for [name], who  recommended transfer for admission to their service for further evaluation. He recommended broad spectrum antibiotics. Patient is amenable with the plan. [VK]    Clinical Course User Index [VK] Ottie Glazier, DO                           Medical Decision Making This patient presents to the ED with chief complaint(s) of weakness and abdominal pain with pertinent past medical history of Crohn's with multiple bowel resections which further complicates the presenting complaint. The complaint involves an extensive differential diagnosis and also carries with it a high risk of complications and morbidity.    The differential diagnosis includes dehydration, electrolyte abnormality, malabsorption, anemia, arrhythmia, ACS, bowel obstruction, bowel leak, intra-abdominal infection  Additional history obtained: Records reviewed Care Everywhere/External Records  ED Course and Reassessment: Due to patient's extensive bowel history and abdominal pain, he will have labs and CT of his abdomen and pelvis with oral and IV contrast to evaluate for dehydration, electrolyte abnormality, bowel obstruction, bowel leak or intra-abdominal infection as causes of his symptoms.  He was given morphine for pain and Zofran for nausea and will be closely monitored.  Independent labs interpretation:  The following labs were independently interpreted: within normal range  Independent visualization of imaging: - I independently visualized the following imaging with scope of interpretation limited to determining acute life threatening conditions related to emergency care: CTAP, which revealed pneumoperitoneum, unknown source, possible subcutaneous abscess near ileostomy site  Consultation: - Consulted or discussed management/test interpretation w/ external professional: Dr. Angela Adam, colorectal surgery at [facility]  Consideration for admission or further workup: Patient requires admission for IV antibiotics and further  surgical evaluation in the setting of his pneumoperitoneum Social Determinants of health: N/A    Amount and/or Complexity of Data Reviewed Labs: ordered. Radiology: ordered.  Risk Prescription drug management.           Final Clinical Impression(s) / ED Diagnoses Final diagnoses:  None    Rx / DC Orders ED Discharge Orders     None         Ernesto Rutherford, [place]  K, DO 03/13/22 1312

## 2022-03-13 NOTE — ED Triage Notes (Signed)
Pt BIB EMS from home for decrease in ADLs. Pt was discharged from [facility] after a resection of an ileostomy. Pt has an ostomy on his right wide w/ a decrease in appetite and more dizziness. Pt ortho vitals show 124/72 to 72/50.  Pt was given 569m Bolus  24g in right thumb

## 2022-03-13 NOTE — ED Notes (Signed)
[facility] notified. [id number]

## 2022-03-21 ENCOUNTER — Inpatient Hospital Stay: Payer: Medicare Other | Admitting: Family Medicine

## 2022-03-24 ENCOUNTER — Telehealth: Payer: Self-pay | Admitting: *Deleted

## 2022-03-24 ENCOUNTER — Encounter: Payer: Self-pay | Admitting: Family Medicine

## 2022-03-24 NOTE — Patient Outreach (Signed)
  Care Coordination   03/24/2022 Name: [name] MRN: [id number] DOB: [birth year]   Care Coordination Outreach Attempts:  An unsuccessful telephone outreach was attempted today to offer the patient information about available care coordination services as a benefit of their health plan.   Follow Up Plan:  Additional outreach attempts will be made to offer the patient care coordination information and services.   Encounter Outcome:  No Answer  Care Coordination Interventions Activated:  No   Care Coordination Interventions:  No, not indicated    Raina Mina, RN Care Management Coordinator [place] Office [phone]

## 2022-03-27 NOTE — Telephone Encounter (Signed)
Error/njr °

## 2022-03-28 ENCOUNTER — Telehealth: Payer: Self-pay | Admitting: Family Medicine

## 2022-03-28 ENCOUNTER — Ambulatory Visit (INDEPENDENT_AMBULATORY_CARE_PROVIDER_SITE_OTHER): Payer: Medicare Other | Admitting: Family Medicine

## 2022-03-28 ENCOUNTER — Encounter: Payer: Self-pay | Admitting: Family Medicine

## 2022-03-28 VITALS — BP 110/78 | HR 73 | Temp 97.9°F | Wt 143.4 lb

## 2022-03-28 DIAGNOSIS — K668 Other specified disorders of peritoneum: Secondary | ICD-10-CM | POA: Diagnosis not present

## 2022-03-28 DIAGNOSIS — M6289 Other specified disorders of muscle: Secondary | ICD-10-CM

## 2022-03-28 DIAGNOSIS — T8149XA Infection following a procedure, other surgical site, initial encounter: Secondary | ICD-10-CM | POA: Diagnosis not present

## 2022-03-28 DIAGNOSIS — K50018 Crohn's disease of small intestine with other complication: Secondary | ICD-10-CM

## 2022-03-28 MED ORDER — DIAZEPAM 10 MG PO TABS: ORAL_TABLET | ORAL | 2 refills | Status: DC

## 2022-03-28 MED ORDER — OXYCODONE HCL 10 MG PO TABS: 10.0000 mg | ORAL_TABLET | Freq: Four times a day (QID) | ORAL | 0 refills | Status: DC | PRN

## 2022-03-28 MED ORDER — AMOXICILLIN-POT CLAVULANATE 400-57 MG PO CHEW
2.0000 | CHEWABLE_TABLET | Freq: Two times a day (BID) | ORAL | 0 refills | Status: DC
Start: 2022-03-28 — End: 2022-05-03

## 2022-03-28 NOTE — Telephone Encounter (Signed)
[name] PT with [facility] is calling and need VO 1-2 per week for 8 wks

## 2022-03-28 NOTE — Progress Notes (Signed)
Subjective:    Patient ID: [name], male    DOB: 03/16/1951, 71 y.o.   MRN: [id number]  HPI Here to follow up a hospital stay at [facility] from 03-14-22 to 03-21-22 and then an ED visit on 03-26-22. In April his surgeon, Dr. Renelda Mom at [facility], did an ileal resection and created an ileostomy due to his Crohn's disease. Then in August she had to redo the ielostomy and repair 3 fistulas in the same area. Now during the last admission he presented with increasing abdominal pain around the ostomy site, and he was found to have a pneumoperitoneum and an abdominal wall abscess. He was treated with IV antibiotics and was sent home on 7 days of Augmentin. He continued to have abdominal pain (no fever) so he went to the ED on 03-26-22. There he had another CT of the abdomen and pelvis, and this should a persistent pneumoperitoneum and an abdominal wall abscess. These were much smaller than before however. His WBC was 8.8 and the Hgb was 11.8. He is scheduled for a surgical follow up with [name] on 04-05-22. His appetite is fairly good. He has some nausea but this is controlled with Zofran. His main complaint today is pelvic floor pain. This started about 10 years ago, but over the past few months this has become more severe. He has no urinary symptoms. No etiology for this pain has ever been found. He is currently taking Oxycodone 5 mg about every 4-6 hours with only minimal relief.    Review of Systems  Constitutional: Negative.   Respiratory: Negative.    Cardiovascular: Negative.   Gastrointestinal:  Positive for abdominal pain and nausea. Negative for abdominal distention, blood in stool, constipation, diarrhea, rectal pain and vomiting.  Genitourinary: Negative.   Musculoskeletal: Negative.        Objective:   Physical Exam Constitutional:      Comments: Frail, weak, quite thin   Cardiovascular:     Rate and Rhythm: Normal rate and regular rhythm.     Pulses: Normal pulses.      Heart sounds: Normal heart sounds.  Pulmonary:     Effort: Pulmonary effort is normal.     Breath sounds: Normal breath sounds.  Abdominal:     General: Abdomen is flat. There is no distension.     Palpations: Abdomen is soft.     Tenderness: There is no right CVA tenderness, left CVA tenderness, guarding or rebound.     Hernia: No hernia is present.     Comments: He is quite tender around the stoma site and the central incision site   Genitourinary:    Penis: Normal.      Testes: Normal.     Rectum: Normal.     Comments: He is very tender in the peritoneum between the scrotum and the anus   Neurological:     Mental Status: He is alert.           Assessment & Plan:  He is recovering from a repeat ileostomy procedure, as well as a pneumoperitoneum and an abdominal wall abscess. We will keep him on Augmentin for at least 10 more days. He has trouble swallowing the standard tablets, so we will change to chewable tablets. He will follow up with Surgery as above. His surgeons and GI team have turned over care of his pelvic floor pain to [place]. We will increase the Oxycodone to 10 mg to take as needed. We will add  Valium 10 mg per rectum as needed. We will also refer him to the [facility] for PT and other treatments. He will return in a few weeks to start on a formal pain management program with [place]. We spent a total of ( 35  ) minutes reviewing records and discussing these issues.  Alysia Penna, MD

## 2022-03-28 NOTE — Telephone Encounter (Signed)
Please okay these orders  ?

## 2022-03-30 ENCOUNTER — Telehealth: Payer: Self-pay | Admitting: Family Medicine

## 2022-03-30 NOTE — Telephone Encounter (Signed)
Left detailed message for Dorian PT with [facility], advised of approval of VO requested

## 2022-03-30 NOTE — Telephone Encounter (Signed)
Jim OT [facility] is calling and would like VO 1x5

## 2022-03-31 ENCOUNTER — Telehealth: Payer: Self-pay | Admitting: Family Medicine

## 2022-03-31 NOTE — Telephone Encounter (Signed)
Spoke with [name] advise that Dr [name] approved requested VO

## 2022-03-31 NOTE — Telephone Encounter (Signed)
Done

## 2022-03-31 NOTE — Telephone Encounter (Signed)
Spoke with [name], advised VO requested have been approved

## 2022-03-31 NOTE — Telephone Encounter (Signed)
Pt oncologist would not approve PT/OT orders from hospital. Pt needs PT 1X1, 2X3, 1X5 and  OT 1x5

## 2022-03-31 NOTE — Telephone Encounter (Signed)
Please okay these orders  ?

## 2022-04-03 ENCOUNTER — Encounter: Payer: Self-pay | Admitting: Family Medicine

## 2022-04-03 ENCOUNTER — Telehealth (INDEPENDENT_AMBULATORY_CARE_PROVIDER_SITE_OTHER): Payer: Medicare Other | Admitting: Family Medicine

## 2022-04-03 DIAGNOSIS — R0602 Shortness of breath: Secondary | ICD-10-CM

## 2022-04-03 DIAGNOSIS — R531 Weakness: Secondary | ICD-10-CM | POA: Diagnosis not present

## 2022-04-03 NOTE — Progress Notes (Signed)
   Subjective:    Patient ID: [name], male    DOB: [birth year], 71 y.o.   MRN: [id number]  HPI Virtual Visit via Telephone Note  I connected with the patient on 04/03/22 at 10:30 AM EDT by telephone and verified that I am speaking with the correct person using two identifiers.   I discussed the limitations, risks, security and privacy concerns of performing an evaluation and management service by telephone and the availability of in person appointments. I also discussed with the patient that there may be a patient responsible charge related to this service. The patient expressed understanding and agreed to proceed.  Location patient: home Location provider: work or home office Participants present for the call: patient, provider Patient did not have a visit in the prior 7 days to address this/these issue(s).   History of Present Illness: Here for increased generalized weakness and SOB. He saw [place] last week, and he was weak overall from his last surgery. He was taking Oxycodone 10 mg every 6 hours for pain control. He complained of pelvic pain that day, so we started him on Diazepam 10 mg per rectum as needed. He says that worked very well for the pelvic pain, but now he is extremely weak and he feels mildly SOB.    Observations/Objective: Patient sounds cheerful and well on the phone. I do not appreciate any SOB. Speech and thought processing are grossly intact. Patient reported vitals:  Assessment and Plan: He is having side effects from the combination of Oxycodone, Diazepam , and overall weakness. He will stop the Diazepam immediately (his last one was 13 hours ago). For pain control, I suggested he add Tylenol 1000 mg TID to the Oxycodone. We will report back in a few days. Alysia Penna, MD   Follow Up Instructions:     [phone] 5-10 [phone] 11-20 [phone] 21-30 I did not refer this patient for an OV in the next 24 hours for this/these issue(s).  I discussed the assessment and  treatment plan with the patient. The patient was provided an opportunity to ask questions and all were answered. The patient agreed with the plan and demonstrated an understanding of the instructions.   The patient was advised to call back or seek an in-person evaluation if the symptoms worsen or if the condition fails to improve as anticipated.  I provided 17 minutes of non-face-to-face time during this encounter.   Alysia Penna, MD     Review of Systems     Objective:   Physical Exam        Assessment & Plan:

## 2022-04-04 ENCOUNTER — Ambulatory Visit: Payer: Medicare Other

## 2022-04-07 ENCOUNTER — Telehealth: Payer: Self-pay | Admitting: Family Medicine

## 2022-04-07 NOTE — Telephone Encounter (Signed)
Spoke with Burman Nieves advised that VO requested have been approved by Dr [name]

## 2022-04-07 NOTE — Telephone Encounter (Signed)
Please okay these orders  ?

## 2022-04-07 NOTE — Telephone Encounter (Signed)
[name] is calling and needs VO for SN for illeostomy education etc. Pt illeostomy bag is leaking and maggie would like to see nurse out to pt house today.

## 2022-04-09 ENCOUNTER — Emergency Department (HOSPITAL_COMMUNITY): Payer: Medicare Other

## 2022-04-09 ENCOUNTER — Encounter (HOSPITAL_COMMUNITY): Payer: Self-pay

## 2022-04-09 ENCOUNTER — Emergency Department (HOSPITAL_COMMUNITY)
Admission: EM | Admit: 2022-04-09 | Discharge: 2022-04-09 | Disposition: A | Discharge status: Other Acute Inpatient Hospital | Payer: Medicare Other | Attending: Emergency Medicine | Admitting: Emergency Medicine

## 2022-04-09 DIAGNOSIS — Z950 Presence of cardiac pacemaker: Secondary | ICD-10-CM | POA: Insufficient documentation

## 2022-04-09 DIAGNOSIS — R7401 Elevation of levels of liver transaminase levels: Secondary | ICD-10-CM | POA: Insufficient documentation

## 2022-04-09 DIAGNOSIS — E871 Hypo-osmolality and hyponatremia: Secondary | ICD-10-CM | POA: Diagnosis not present

## 2022-04-09 DIAGNOSIS — N179 Acute kidney failure, unspecified: Secondary | ICD-10-CM | POA: Diagnosis not present

## 2022-04-09 DIAGNOSIS — J45909 Unspecified asthma, uncomplicated: Secondary | ICD-10-CM | POA: Diagnosis not present

## 2022-04-09 DIAGNOSIS — R0602 Shortness of breath: Secondary | ICD-10-CM | POA: Diagnosis present

## 2022-04-09 DIAGNOSIS — K9413 Enterostomy malfunction: Secondary | ICD-10-CM | POA: Insufficient documentation

## 2022-04-09 DIAGNOSIS — R627 Adult failure to thrive: Secondary | ICD-10-CM | POA: Insufficient documentation

## 2022-04-09 LAB — COMPREHENSIVE METABOLIC PANEL
ALT: 64 U/L — ABNORMAL HIGH (ref 0–44)
AST: 58 U/L — ABNORMAL HIGH (ref 15–41)
Albumin: 4.6 g/dL (ref 3.5–5.0)
Alkaline Phosphatase: 132 U/L — ABNORMAL HIGH (ref 38–126)
Anion gap: 13 (ref 5–15)
BUN: 42 mg/dL — ABNORMAL HIGH (ref 8–23)
CO2: 32 mmol/L (ref 22–32)
Calcium: 10.3 mg/dL (ref 8.9–10.3)
Chloride: 85 mmol/L — ABNORMAL LOW (ref 98–111)
Creatinine, Ser: 2.17 mg/dL — ABNORMAL HIGH (ref 0.61–1.24)
GFR, Estimated: 32 mL/min — ABNORMAL LOW (ref >60)
Glucose, Bld: 109 mg/dL — ABNORMAL HIGH (ref 70–99)
Potassium: 4 mmol/L (ref 3.5–5.1)
Sodium: 130 mmol/L — ABNORMAL LOW (ref 135–145)
Total Bilirubin: 1.2 mg/dL (ref 0.3–1.2)
Total Protein: 9 g/dL — ABNORMAL HIGH (ref 6.5–8.1)

## 2022-04-09 LAB — CBC WITH DIFFERENTIAL/PLATELET
Abs Immature Granulocytes: 0.03 10*3/uL (ref 0.00–0.07)
Basophils Absolute: 0 10*3/uL (ref 0.0–0.1)
Basophils Relative: 0 %
Eosinophils Absolute: 0.1 10*3/uL (ref 0.0–0.5)
Eosinophils Relative: 1 %
HCT: 50.7 % (ref 39.0–52.0)
Hemoglobin: 15.7 g/dL (ref 13.0–17.0)
Immature Granulocytes: 0 %
Lymphocytes Relative: 24 %
Lymphs Abs: 2.6 10*3/uL (ref 0.7–4.0)
MCH: 23.8 pg — ABNORMAL LOW (ref 26.0–34.0)
MCHC: 31 g/dL (ref 30.0–36.0)
MCV: 76.8 fL — ABNORMAL LOW (ref 80.0–100.0)
Monocytes Absolute: 0.6 10*3/uL (ref 0.1–1.0)
Monocytes Relative: 6 %
Neutro Abs: 7.3 10*3/uL (ref 1.7–7.7)
Neutrophils Relative %: 69 %
Platelets: 284 10*3/uL (ref 150–400)
RBC: 6.6 MIL/uL — ABNORMAL HIGH (ref 4.22–5.81)
RDW: 17.7 % — ABNORMAL HIGH (ref 11.5–15.5)
WBC: 10.7 10*3/uL — ABNORMAL HIGH (ref 4.0–10.5)
nRBC: 0 % (ref 0.0–0.2)

## 2022-04-09 LAB — URINALYSIS, ROUTINE W REFLEX MICROSCOPIC
Bilirubin Urine: NEGATIVE
Glucose, UA: NEGATIVE mg/dL
Hgb urine dipstick: NEGATIVE
Ketones, ur: NEGATIVE mg/dL
Leukocytes,Ua: NEGATIVE
Nitrite: NEGATIVE
Protein, ur: NEGATIVE mg/dL
Specific Gravity, Urine: 1.025 (ref 1.005–1.030)
pH: 5 (ref 5.0–8.0)

## 2022-04-09 LAB — CBG MONITORING, ED: Glucose-Capillary: 104 mg/dL — ABNORMAL HIGH (ref 70–99)

## 2022-04-09 MED ORDER — SODIUM CHLORIDE 0.9 % IV BOLUS: 1000.0000 mL | Freq: Once | INTRAVENOUS | Status: DC

## 2022-04-09 NOTE — ED Provider Triage Note (Signed)
Emergency Medicine Provider Triage Evaluation Note  [name] , a 71 y.o. male  was evaluated in triage.  Pt presents with 2 complaints today.  First complaint of increasing presyncopal sensation with exertion.  Feels as if he is very weak and might pass out when he exerts himself from walking from 1 side of room to the other.  States like he is unable to catch his breath.  Has been unable to keep up with nutrition, this has been increasing over the last 10 days.  Saw PCP for the same problem, recently started on a benzo/opioid combo for pelvic floor pain.  Patient concerned that "I could die from this" combo.  Also complaining leak in ileostomy bag.  States when he changes it, continues to have small leaks.  Tried to get home health out to fix it over the last 4 days, no one was able to calm her patient.  Denies fever, chills, changes in urinary habits, abdominal pain, or active shortness of breath.  Denies chest pain or headache.  Review of Systems  Positive:  Negative: See above  Physical Exam  BP 100/74 (BP Location: Left Arm)   Pulse 88   Temp 97.7 F (36.5 C) (Oral)   Resp 15   SpO2 100%  Gen:   Awake, no distress   Resp:  Normal effort, CTAB, equal chest rise MSK:   Moves extremities without difficulty  Other:  Coordination and strength of upper and lower extremities appears grossly intact.  Mildly diminished PT and radial pulses equally, CRT less than 2.  Chest non-TTP.  Abdomen soft non-TTP.  Medical Decision Making  Medically screening exam initiated at 12:11 PM.  Appropriate orders placed.  Marcelino Duster was informed that the remainder of the evaluation will be completed by another provider, this initial triage assessment does not replace that evaluation, and the importance of remaining in the ED until their evaluation is complete.     Prince Rome, PA-C 68/37/29 1220

## 2022-04-09 NOTE — ED Triage Notes (Signed)
Pt arrived via EMS, from home, c/o wkn, dizziness, SOB.

## 2022-04-09 NOTE — ED Notes (Addendum)
Nurse called [facility] to have patient transported from [facility] to [facility]

## 2022-04-09 NOTE — ED Triage Notes (Signed)
Pt ileostomy has been leaking, stoma is recessed. Pt has limited PO intake for almost a week d/t this.  Pt reported beginning to have SOB after taking oxycodone and diazepem, so stopped taking it several days ago. Pt still reporting SOB.

## 2022-04-09 NOTE — ED Provider Notes (Signed)
[facility] DEPT Provider Note   CSN: [id number] Arrival date & time: 04/09/22  1145     History  Chief Complaint  Patient presents with   Weakness   Dizziness   Shortness of Breath    [name] is a 71 y.o. male with complex history described below presenting today with lightheadedness, weakness, and a mild episode of shortness of breath.  On 02/15/2022 underwent ileostomy with bowel resection by [name].  Previous similar procedure 11/11/2021.  Decreased nutritional intake since ileostomy has been leaking over the last few weeks.  Finds it difficult to eat/drink much as continues to leak.  Expresses interest in other options available than his current ileostomy and associated situation.   Believes this may be increasing his overall weakness.  Has been having episodes of lightheadedness as well, worse with movement and exertion.  Feels significantly weak and dehydrated more than usual.  Denies chest pain, palpitations, disorientation, back pain, or leg weakness when this occurs.  Also with one recent episode of shortness of breath after taking oxycodone and diazepam together.  Was prescribed this combination of medications for his pelvic floor dysfunction and pain.  Had taken this for 3 days, began developing the shortness of breath.  After researching on the Internet, was concerned that this combination "may kill me, these can cause respiratory depression".  Stop taking this combination 2 days ago, and shortness of breath has improved since then.  No recent upper respiratory infection, cough, or chest tightness.  Hx of diverticulitis, Crohn's disease, pacemaker, severe protein and nutritional deficiencies, BPH, ADHD, asthma, GERD, HSV, hemorrhoids, dilated aortic root, and pelvic flood dysfunction.  Also with long term use of immunosuppressant (Entyvio).  The history is provided by the patient and medical records.  Weakness Associated symptoms:  dizziness and shortness of breath   Dizziness Associated symptoms: shortness of breath and weakness   Shortness of Breath    Home Medications Prior to Admission medications   Medication Sig Start Date End Date Taking? Authorizing Provider  albuterol (PROVENTIL HFA;VENTOLIN HFA) 108 (90 Base) MCG/ACT inhaler INHALE 2 PUFFS INTO THE LUNGS EVERY 4 (FOUR) HOURS AS NEEDED FOR WHEEZING OR SHORTNESS OF BREATH. 10/24/18   [name], MD  amoxicillin-clavulanate (AUGMENTIN) 400-57 MG chewable tablet Chew 2 tablets by mouth 2 (two) times daily. 03/28/22   [name], MD  diazepam (VALIUM) 10 MG tablet Use one tablet per rectum every 6 hours as needed for pelvic pain Patient not taking: Reported on 04/03/2022 03/28/22   [name], MD  diphenoxylate-atropine (LOMOTIL) 2.5-0.025 MG tablet 1-2 tablets 4 times a day as neded 08/15/21   Gatha Mayer, MD  EPINEPHrine (EPIPEN 2-PAK) 0.3 mg/0.3 mL IJ SOAJ injection Inject 0.3 mLs (0.3 mg total) into the muscle as needed for anaphylaxis. 11/09/18   Sherwood Gambler, MD  fluticasone (CUTIVATE) 0.05 % cream Apply 1 application topically 2 (two) times daily as needed for irritation. 11/02/20   [provider]  halobetasol (ULTRAVATE) 0.05 % ointment Apply topically 2 (two) times daily. Patient taking differently: Apply 1 application  topically daily as needed (for psoriasis). 01/08/18   [name], MD  hydrocortisone (ANUSOL-HC) 25 MG suppository Place 1 suppository (25 mg total) rectally 2 (two) times daily. 02/02/22   [name], MD  hyoscyamine (LEVSIN SL) 0.125 MG SL tablet PLACE 1 TABLET UNDER THE TONGUE EVERY 4 HOURS AS NEEDED FOR CRAMPING (URGENT DEFECATION) Patient taking differently: Take 0.125 mg by  mouth every 4 (four) hours as needed for cramping (for cramping (urgent defecation)). 08/15/21   Gatha Mayer, MD  ketoconazole (NIZORAL) 2 % cream APPLY TWICE A DAY AS NEEDED FOR FUNGAL INFECTION 01/18/22   [name], MD  loperamide  (IMODIUM) 2 MG capsule Take 2 capsules (4 mg total) by mouth 3 (three) times daily before meals. 01/06/22   Eugenie Filler, MD  losartan (COZAAR) 25 MG tablet Take 1 tablet (25 mg total) by mouth daily. Call and schedule follow up visit for further refills. [phone]. 2nd attempt 01/09/22   Eugenie Filler, MD  mirtazapine (REMERON) 15 MG tablet Take 1 tablet (15 mg total) by mouth at bedtime. 01/18/22   Gatha Mayer, MD  Multiple Vitamins-Minerals (MULTIVITAMIN WITH MINERALS) tablet Take 1 tablet by mouth daily.    [provider]  Oxycodone HCl 10 MG TABS Take 1 tablet (10 mg total) by mouth every 6 (six) hours as needed (pain). 03/28/22   [name], MD  pantoprazole (PROTONIX) 40 MG tablet Take 1 tablet (40 mg total) by mouth daily at 6 (six) AM. 01/07/22   Eugenie Filler, MD  pramoxine-hydrocortisone [facility]) 1-1 % rectal cream Place 1 application rectally 2 (two) times daily. Use twice daily before SITZ baths 04/21/19   Milus Banister, MD  promethazine (PHENERGAN) 25 MG tablet Take 1 tablet (25 mg total) by mouth every 6 (six) hours as needed for nausea or vomiting. 01/18/22   [name], MD  scopolamine (TRANSDERM-SCOP) 1 MG/3DAYS Place 1 patch (1.5 mg total) onto the skin every 3 (three) days. 01/08/22   Eugenie Filler, MD  tadalafil (CIALIS) 5 MG tablet Take 1 tablet (5 mg total) by mouth daily. 11/23/17   [name], MD  vedolizumab (ENTYVIO) 300 MG injection INFUSE 300 MG IV every 8 weeks Patient taking differently: Inject 300 mg into the vein every 28 (twenty-eight) days. Every 4 weeks 08/26/21   Gatha Mayer, MD      Allergies    Mercaptopurine, Shellfish allergy, Humira [adalimumab], and Wound dressing adhesive    Review of Systems   Review of Systems  Respiratory:  Positive for shortness of breath.   Neurological:  Positive for dizziness and weakness.    Physical Exam Updated Vital Signs BP 99/69   Pulse 87   Temp 97.7 F (36.5 C)    Resp 16   SpO2 100%  Physical Exam Vitals and nursing note reviewed.  Constitutional:      General: He is not in acute distress.    Appearance: He is well-developed and underweight. He is not ill-appearing or toxic-appearing.  HENT:     Head: Normocephalic and atraumatic.  Eyes:     General: Lids are normal. Gaze aligned appropriately.     Conjunctiva/sclera: Conjunctivae normal.  Neck:     Comments: Very supple on exam, no meningismus or torticollis Cardiovascular:     Rate and Rhythm: Normal rate and regular rhythm.     Pulses:          Radial pulses are 2+ on the right side and 2+ on the left side.     Heart sounds: No murmur heard. Pulmonary:     Effort: Pulmonary effort is normal. No respiratory distress.     Breath sounds: Normal breath sounds.     Comments: CTAB, able to communicate without difficulty, without increased respiratory effort Abdominal:     General: There is no distension.  Palpations: Abdomen is soft.     Tenderness: There is no abdominal tenderness. There is no right CVA tenderness, left CVA tenderness or guarding.     Comments: Ileostomy site with active leakage of bowel material, light brown/yellow-brown in color.  No evidence of obvious blood in the stool.  Tenderness of the area with mild erythema on the lateral aspect of the ileostomy site.  Bag attached to ileostomy.  Musculoskeletal:        General: No swelling.     Cervical back: Neck supple.     Right lower leg: No edema.     Left lower leg: No edema.  Skin:    General: Skin is warm and dry.     Capillary Refill: Capillary refill takes less than 2 seconds.  Neurological:     Mental Status: He is alert.  Psychiatric:        Mood and Affect: Mood normal.        Behavior: Behavior is cooperative.     ED Results / Procedures / Treatments   Labs (all labs ordered are listed, but only abnormal results are displayed) Labs Reviewed  COMPREHENSIVE METABOLIC PANEL - Abnormal; Notable for the  following components:      Result Value   Sodium 130 (*)    Chloride 85 (*)    Glucose, Bld 109 (*)    BUN 42 (*)    Creatinine, Ser 2.17 (*)    Total Protein 9.0 (*)    AST 58 (*)    ALT 64 (*)    Alkaline Phosphatase 132 (*)    GFR, Estimated 32 (*)    All other components within normal limits  CBC WITH DIFFERENTIAL/PLATELET - Abnormal; Notable for the following components:   WBC 10.7 (*)    RBC 6.60 (*)    MCV 76.8 (*)    MCH 23.8 (*)    RDW 17.7 (*)    All other components within normal limits  URINALYSIS, ROUTINE W REFLEX MICROSCOPIC - Abnormal; Notable for the following components:   Color, Urine AMBER (*)    All other components within normal limits  CBG MONITORING, ED - Abnormal; Notable for the following components:   Glucose-Capillary 104 (*)    All other components within normal limits    EKG None  Radiology DG Chest 2 View  Result Date: 04/09/2022 CLINICAL DATA:  Shortness of breath EXAM: CHEST - 2 VIEW COMPARISON:  11/26/2021 and prior studies FINDINGS: The cardiomediastinal silhouette is unremarkable. LEFT-sided pacemaker again noted. There is no evidence of focal airspace disease, pulmonary edema, suspicious pulmonary nodule/mass, pleural effusion, or pneumothorax. No acute bony abnormalities are identified. IMPRESSION: No active cardiopulmonary disease. Electronically Signed   By: Margarette Canada M.D.   On: 04/09/2022 17:11    Procedures Procedures    Medications Ordered in ED Medications  sodium chloride 0.9 % bolus 1,000 mL (has no administration in time range)    ED Course/ Medical Decision Making/ A&P Clinical Course as of 04/09/22 1938  Sun Apr 09, 2022  1430 Spoke with Network engineer, no ostomy nurse available today  [AC]  1655 Consulted with Dr. Johney Maine of [facility] surgical group.  Discussed pt case at length.  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  Dr. Johney Maine  willing to accept the pt.  Will receive a phone call back with room assignment and further accepting information. [AC]  1830 Informed by nurse that IV team  experienced significant difficulty establishing access.  Also informed that transport team has arrived to bring pt to [facility]. [AC]    Clinical Course User Index [AC] Prince Rome, PA-C                           Medical Decision Making Amount and/or Complexity of Data Reviewed Labs: ordered. Radiology: ordered.   71 y.o. male presents to the ED for concern of Weakness, Dizziness, and Shortness of Breath     This involves an extensive number of treatment options, and is a complaint that carries with it a high risk of complications and morbidity.  The emergent differential diagnosis prior to evaluation includes, but is not limited to: AKI, failure to thrive, symptomatic anemia  This is not an exhaustive differential.   Past Medical History / Co-morbidities / Social History: Hx of diverticulitis, Crohn's disease, pacemaker, severe protein malnutrition, BPH, ADHD, asthma, GERD, HSV, hemorrhoids, dilated aortic root Social Determinants of Health include: Elderly  Additional History:  Obtained by chart review.  Notably surgical note from 02/15/2022-ileostomy with bowel resection by [name].  Previous similar procedure 11/11/2021.  Lab Tests: I ordered, and personally interpreted labs.  The pertinent results include:   Creatinine 2.17, increased from 1.583 weeks ago.  With BUN 42 and GFR 32, suggestive of AKI Mild hyponatremia of 130, was 139 3 weeks ago Potassium 4.0 Mild elevated LFTs: AST 58, ALT 64, alk phos 132 Total bili 1.2, normal WBC 10.7  Imaging Studies: I personally ordered and interpreted EKG 12 lead findings, which showed: RBBB and LAFB, without significant changes since last tracing  Cardiac Monitoring: The patient was maintained on a cardiac monitor.  I personally viewed and interpreted  the cardiac monitored which showed an underlying rhythm of: NSR  ED Course / Critical Interventions: Pt well-appearing on exam.  Nontoxic, nonseptic appearing in NAD.  AAOx4.  Accompanied by family member.  Verbal permission required to discuss medical information in front of family member.  Presenting today with increased weakness, lightheadedness/presyncopal sensation with exertion, and 1 recent episode of shortness of breath.  Shortness of breath appears to be related to combination of benzodiazepine with opioid, and has mostly relieved since stopping that medication 2 days ago.  Known Hx of enlarged thoracic aorta, however without back pain, lower extremity weakness, excruciating pain.  Low concern for dissection or symptomatic aneurysm.  Without recent URI symptoms, cough, or fever.  Low suspicion for pneumonia.  However also with ileostomy dysfunction since recent surgical fixation about 2 months ago.  Has been having a slow consistent leak from the ileostomy site over the last week, though increasing.  Without issue until then.  Also with some erythema, warmth, and skin irritation of the lateral aspect of the ostomy site.  Drainage difficult to distinguish if leaking from bag attachment, wound itself, or both.  Cannot rule out possible fistula formation near ileostomy.  Patient now with even more decreased nutritional intake than before.  Presenting as failure to thrive, and has presented similarly several times in the last few months.  Usually managed at [facility] with general surgery team.  Appears underweight, with known multiple nutritional deficiencies.  Without urinary symptoms, however labs suggestive of AKI.  UA without evidence of hematuria or infection.   Consulted with Dr. Johney Maine of [facility] surgical group.  Discussed pt case at length.  Believe pt may have a ileostomy site  fistula, accompanied with FTT, AKI, and possible dehydration.  Dr. Johney Maine  willing to accept the pt.  Will receive a phone call back with room assignment and further accepting information. Upon reevaluation, pt status has remained unchanged.  CXR unremarkable.  Satting at 100% on room air.  Known difficult IV access.  IV team attempted access and had significant difficulty.  Transportation to [facility] arrived, pt hemodynamically stable and ready for transport. I have reviewed the patients home medicines and have made adjustments as needed.  Disposition: Being transferred to [facility] and has been accepted by Dr. Johney Maine of general surgery team via patient transport.  EMTALA completed.   I discussed this case with my attending, Dr. Ernesto Rutherford, who agreed with the proposed treatment course and cosigned this note including patient's presenting symptoms, physical exam, and planned diagnostics and interventions.  Attending physician stated agreement with plan or made changes to plan which were implemented.     This chart was dictated using voice recognition software.  Despite best efforts to proofread, errors can occur which can change the documentation meaning.         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

## 2022-04-10 ENCOUNTER — Ambulatory Visit (INDEPENDENT_AMBULATORY_CARE_PROVIDER_SITE_OTHER): Payer: Medicare Other

## 2022-04-10 VITALS — Ht 70.0 in | Wt 145.0 lb

## 2022-04-10 DIAGNOSIS — Z0000 Encounter for general adult medical examination without abnormal findings: Secondary | ICD-10-CM | POA: Diagnosis not present

## 2022-04-10 NOTE — Consults (Signed)
 ------------------------------------------------------------------------------- Attestation signed by Harlene Charlie Riis, MD at 04/12/2022  3:39 PM I saw and evaluated the patient and reviewed the resident's note. I agree with the resident's findings and plan.  I have independently reviewed and interpreted the most recent radiology images, laboratory testing, and echocardiogram images as well as ECGs.  Late entry for 03/10/2022.  Electronically Signed by: Charlie Riis Harlene, MD, Attending Physician 04/12/2022 3:38 PM  -------------------------------------------------------------------------------  Cardiology Consult Note  Primary Care Physician:  Johnny Garnette Knee, MD (General)  Chief Complaint/Reason for Consult: hx of SSS s/p dual-chamber pacemaker (11/2020), chest pain with ?New EKG changes  History of Present Illness:  Patient is a 71 yo male with SSS s/p dual chamber ppm 2022, bifasicular bundle branch block, SVT, crohn's disease and complex past surgical history including ex lap, SBR, LOA, mesh explantation, DLI in April 2023who presentswith stoma retraction, decreased stoma output, and decreased oral intake x5 days. Initial workup at [facility] notable for WBC 10.7 and a Cr 2.17 (baseline Cr 0.8). Hospital course c/b chest pain with ?EKG changes for which cardiology is consulted.  Patient state he has been experiencing intermittent chest pain for the past 2 weeks. 2 weeks ago patient states he was started on diazepam  and oxycodone  after which he developed severely depressed respiratory distress and chest pain. Chest pain is localized to the epigastric region, exacerbated with sitting upright and exertion with severity as high as 8/10. Denies numbness, tingling, vision changes, weakness, or other complaints.  04/09/22 EKG - sinus rhythm, HR 100, Bifasicular block (RBBB + LAFB), no evidence of ischemia   Baseline EKG from 03/03/22 - sinus rhythm, HR 90, RBBB   Active Problems:   *  No active hospital problems. *   Coronary Risk Factors: advanced age (older than 6 for men, 31 for women) and male gender  Past Medical History:  Diagnosis Date  . Pacemaker 11/03/2021    Past Surgical History:  Procedure Laterality Date  . ILEOSTOMY CLOSURE N/A 02/15/2022   Procedure: exploratory laparotomy, enterocutaneous fistula take down, loop illeostomy creation, small bowel resection, ileocolectomy, colonoscopy;  Surgeon: Cy Marcheta Brochure, MD;  Location: [facility] MAIN OR;  Service: General;  Laterality: N/A;  . LAPAROSCOPIC SMALL BOWEL RESECTION N/A 11/11/2021   Procedure: SMALL BOWEL RESECTION x2, Ex Lap, repair of enterotomy, creation of diverting loop ileostomy, explant of mesh.;  Surgeon: Cy Marcheta Brochure, MD;  Location: [facility] MAIN OR;  Service: General;  Laterality: N/A;  TAP blocks    No family history on file.  Social History   Socioeconomic History  . Marital status: Married    Spouse name: Not on file  . Number of children: Not on file  . Years of education: Not on file  . Highest education level: Not on file  Occupational History  . Not on file  Tobacco Use  . Smoking status: Never  . Smokeless tobacco: Never  Substance and Sexual Activity  . Alcohol use: Not on file  . Drug use: Not on file  . Sexual activity: Not on file  Other Topics Concern  . Not on file  Social History Narrative  . Not on file   Social Determinants of Health   Financial Resource Strain: Not on file  Food Insecurity: Not on file  Transportation Needs: Not on file  Physical Activity: Not on file  Stress: Not on file  Social Connections: Not on file  Housing Stability: Not on file   Allergies  Allergen Reactions  . Mercaptopurine  Other (See  Comments)    Pancreatitis   . Shellfish Containing Products Anaphylaxis (ALLERGY) and Arthralgias (intolerance)    Throat started to close  . Adalimumab  Other (See Comments)    Developed antibodies  . Adhesive Tape Rash (ALLERGY/intolerance)     Prior to Admission medications   Medication Sig Start Date End Date Taking? Authorizing Provider  albuterol  90 mcg/actuation inhaler Inhale 2 puffs into the lungs every 6 (six) hours as needed for Wheezing or Shortness of Breath. 11/23/21   [provider]  busPIRone  (BUSPAR ) 5 MG tablet Take 1 tablet (5 mg total) by mouth 3 times daily. 03/10/22   Abran Verneita Berger, PA-C  diphenoxylate -atropine  (LOMOTIL ) 2.5-0.025 mg per tablet Take 1 tablet by mouth 2 times daily. 03/21/22   Leonidas Lukes, MD PhD  magnesium  oxide (MAG-OX) 400 mg (241.3 mg magnesium ) tablet Take 1 tablet (400 mg total) by mouth 2 times daily. 03/10/22   Abran Verneita Berger, PA-C  melatonin 3 mg Tab Take 1 tablet (3 mg total) by mouth nightly. 03/10/22   Abran Verneita Berger, PA-C  multivitamin tablet Take 1 tablet by mouth daily.    [provider]  NON FORMULARY Tumeric gummies ( strength unknown by patient ) Take one gummy daily    [provider]  omeprazole (PRILOSEC) 20 MG DR capsule Take 1 capsule (20 mg total) by mouth daily before breakfast. 03/10/22   Abran Verneita Berger, PA-C  oxyCODONE  (ROXICODONE ) 5 MG immediate release tablet Take 1 tablet (5 mg total) by mouth every 8 (eight) hours as needed for up to 10 doses for Severe pain (7-10). 03/26/22   Emilee Pray, MD  prochlorperazine  (COMPAZINE ) 10 MG tablet Take 1 tablet (10 mg total) by mouth 2 (two)  times daily as needed for Nausea. 03/26/22   Nogle, [place] Elizabeth, DO  simethicone  (MYLICON,PHAZYME) 80 MG chewable tablet Chew 1 tablet (80 mg total) by mouth 4 times daily before meals and nightly. 02/10/22   Joesph Alm Bruckner, MD  traZODone  (DESYREL ) 50 MG tablet Take 2 tablets (100 mg total) by mouth at bedtime. 03/10/22   Abran Verneita Berger, PA-C    Review of Systems All other systems were reviewed and were negative in detail except as noted in the HPI.  Objective: Vital Signs: Temp:  [96.3 F (35.7 C)-99.3 F (37.4 C)]  96.3 F (35.7 C) Pulse:  [80-98] 80 Resp:  [12-18] 16 BP: (106-118)/(75-88) 118/88 MAP (mmHg):  [90-102] 102 SpO2:  [97 %-99 %] 98 % Continuous VISI Blood Pressure: (101-118)/(72-88) 118/88 Continuous VISI BP MAP:  [86 mmHg-102 mmHg] 102 mmHg Pain Score: 7-Seven (severe) Hemodynamics (if applicable):   Respiratory/Ventilator Data (if applicable): O2 Device: None (Room air)    I&O I/O last 3 completed shifts: In: 355 [I.V.:355] Out: 200 [Stool:200] No intake/output data recorded.   PHYSICAL EXAMINATION: Constitutional: Well developed, well nourished, well groomed and no acute distress. Skin: Normal skin turgor. Skin warm and dry, no rash or lesions. EENT: Trachea midline. No JVD. No conjunctival injection.  No scleral icterus. Moist mucous membranes.  Pulm: Unlabored without retractions. No conversational dyspnea. Symmetric expansion of chest wall. CTAB with no wheezes, rhonchi, rales/crackles. CV: RRR. Normal S1 and S2. No S3 or S4. No murmurs, rubs, gallops.  GI: Soft, non-tender, non-distended. BS 4+. Extremities: Moves all extremities. No atrophy. No edema. Distal pulses 2+ bilaterally, equal. Neuro: AAOx3, no facial droop, no focal neuro deficit, no gross motor or sensory deficits Psych: Orientated to time, place and person. Normal mood and affect.  Recent and remote memory intact. Attention and perceptions seem appropriate to situation.  LABS: Recent Results (from the past 24 hour(s))  MRSA PCR   Collection Time: 04/09/22 10:14 PM   Specimen: Swab; Nasal  Result Value Ref Range   MRSA Surveillance by PCR Positive (A) Negative, INVALID  Comprehensive Metabolic Panel   Collection Time: 04/10/22 12:25 AM  Result Value Ref Range   Sodium 128 (L) 135 - 146 MMOL/L   Potassium 3.9 3.5 - 5.3 MMOL/L   Chloride 83 (L) 98 - 110 MMOL/L   CO2 30 21 - 31 MMOL/L   BUN 44 (H) 8 - 24 MG/DL   Glucose 893 (H) 70 - 99 MG/DL   Creatinine 7.85 (H) 9.29 - 1.30 MG/DL   Calcium  10.3 8.5  - 10.5 MG/DL   Total Protein 7.7 6.4 - 8.9 G/DL   Albumin   4.5 3.5 - 5.7 G/DL   Total Bilirubin 1.0 0.0 - 1.0 MG/DL   Alkaline Phosphatase 138 (H) 34 - 104 IU/L or U/L   AST (SGOT) 47 (H) 13 - 39 IU/L or U/L   ALT (SGPT) 56 (H) 7 - 52 IU/L or U/L   Anion Gap 15 (H) 4 - 14 MMOL/L   Est. GFR 33 (L) >=60 ML/MIN/1.73 M*2   Magnesium    Collection Time: 04/10/22 12:25 AM  Result Value Ref Range   Magnesium  2.1 1.9 - 2.7 MG/DL  Potassium   Collection Time: 04/10/22 12:25 AM  Result Value Ref Range   Potassium 3.9 3.5 - 5.3 MMOL/L  CBC   Collection Time: 04/10/22 12:25 AM  Result Value Ref Range   WBC 10.7 4.4 - 11.0 x 10*3/uL   RBC 5.90 4.50 - 5.90 x 10*6/uL   Hemoglobin 14.1 14.0 - 17.5 G/DL   Hematocrit 56.3 58.4 - 50.4 %   MCV 73.9 (L) 80.0 - 96.0 FL   MCH 23.8 (L) 27.5 - 33.2 PG   MCHC 32.2 (L) 33.0 - 37.0 G/DL   RDW 82.0 (H) 87.6 - 82.9 %   Platelets 228 150 - 450 X 10*3/uL   MPV 9.3 6.8 - 10.2 FL  Lactic Acid   Collection Time: 04/10/22 12:25 AM  Result Value Ref Range   Lactic Acid 1.9 0.5 - 2.0 MMOL/L   Assessment: #Chest pain #AKI #Hyponatremia #Mild transaminitis  Patient is a 71 yo male with SSS s/p dual chamber ppm 2022, bifasicular bundle branch block, SVT, crohn's disease and complex past surgical history including ex lap, SBR, LOA, mesh explantation, DLI in April 2023who presentswith stoma retraction, decreased stoma output, and decreased oral intake x5 days. Hospital course c/b chest pain with ?EKG changes for which cardiology is consulted. Chest pain atypical with epigastric location, correlating with timing of presenting decreased stoma output although exertional nature of symptoms are concerning. Notable cardiac risk factors include male gender and advanced age. GRACE score 109. Review of EKG trends show at baseline patient has RBBB with most recent EKG notable for bifascicular block with RBBB and LAFB consistent with patient's prior history of bifascicular  block.   Recommendations: -Recommend TTE to assess LV function -Check troponin -Repeat EKG and trend troponin if chest pain recurs -Replete lytes prn. Electrolyte goal Mg>2, K>4 -From a cardiology perspective, would benefit from increased PO intake  Electronically signed by: Reyes Prentice Later, MD 04/10/2022 8:55 AM      Electronically signed by: Later Reyes Prentice, MD Resident 04/10/22 [phone]    Electronically signed by: Harlene Charlie Riis, MD 04/12/22 1539

## 2022-04-10 NOTE — Progress Notes (Signed)
I connected with [name] today by telephone and verified that I am speaking with the correct person using two identifiers. Location patient: home Location provider: work Persons participating in the virtual visit: [name], Justin Durand LPN.   I discussed the limitations, risks, security and privacy concerns of performing an evaluation and management service by telephone and the availability of in person appointments. I also discussed with the patient that there may be a patient responsible charge related to this service. The patient expressed understanding and verbally consented to this telephonic visit.    Interactive audio and video telecommunications were attempted between this provider and patient, however failed, due to patient having technical difficulties OR patient did not have access to video capability.  We continued and completed visit with audio only.     Vital signs may be patient reported or missing.  Subjective:   [name] is a 71 y.o. male who presents for an Initial Medicare Annual Wellness Visit.  Review of Systems     Cardiac Risk Factors include: advanced age (>54mn, >>62women)     Objective:    Today's Vitals   04/10/22 1041 04/10/22 1042  Weight: 145 lb (65.8 kg)   Height: 5' 10"  (1.778 m)   PainSc:  6    Body mass index is 20.81 kg/m.     04/10/2022   10:50 AM 03/13/2022    6:48 AM 02/12/2022    3:51 PM 02/11/2022    6:31 PM 01/24/2022    4:15 PM 01/02/2022    7:59 PM 11/26/2021    5:57 PM  Advanced Directives  Does Patient Have a Medical Advance Directive? No No No No No No No  Would patient like information on creating a medical advance directive?    No - Patient declined  No - Patient declined     Current Medications (verified) Outpatient Encounter Medications as of 04/10/2022  Medication Sig   albuterol (PROVENTIL HFA;VENTOLIN HFA) 108 (90 Base) MCG/ACT inhaler INHALE 2 PUFFS INTO THE LUNGS EVERY 4 (FOUR) HOURS AS NEEDED FOR  WHEEZING OR SHORTNESS OF BREATH.   diphenoxylate-atropine (LOMOTIL) 2.5-0.025 MG tablet 1-2 tablets 4 times a day as neded   fluticasone (CUTIVATE) 0.05 % cream Apply 1 application topically 2 (two) times daily as needed for irritation.   halobetasol (ULTRAVATE) 0.05 % ointment Apply topically 2 (two) times daily. (Patient taking differently: Apply 1 application  topically daily as needed (for psoriasis).)   hydrocortisone (ANUSOL-HC) 25 MG suppository Place 1 suppository (25 mg total) rectally 2 (two) times daily.   hyoscyamine (LEVSIN SL) 0.125 MG SL tablet PLACE 1 TABLET UNDER THE TONGUE EVERY 4 HOURS AS NEEDED FOR CRAMPING (URGENT DEFECATION) (Patient taking differently: Take 0.125 mg by mouth every 4 (four) hours as needed for cramping (for cramping (urgent defecation)).)   ketoconazole (NIZORAL) 2 % cream APPLY TWICE A DAY AS NEEDED FOR FUNGAL INFECTION   loperamide (IMODIUM) 2 MG capsule Take 2 capsules (4 mg total) by mouth 3 (three) times daily before meals.   losartan (COZAAR) 25 MG tablet Take 1 tablet (25 mg total) by mouth daily. Call and schedule follow up visit for further refills. [phone] 2nd attempt   Multiple Vitamins-Minerals (MULTIVITAMIN WITH MINERALS) tablet Take 1 tablet by mouth daily.   Oxycodone HCl 10 MG TABS Take 1 tablet (10 mg total) by mouth every 6 (six) hours as needed (pain).   pantoprazole (PROTONIX) 40 MG tablet Take 1 tablet (40 mg total) by mouth daily at  6 (six) AM.   pramoxine-hydrocortisone (ANALPRAM-HC) 1-1 % rectal cream Place 1 application rectally 2 (two) times daily. Use twice daily before SITZ baths   traZODone (DESYREL) 100 MG tablet Take 100 mg by mouth at bedtime.   amoxicillin-clavulanate (AUGMENTIN) 400-57 MG chewable tablet Chew 2 tablets by mouth 2 (two) times daily. (Patient not taking: Reported on 04/10/2022)   diazepam (VALIUM) 10 MG tablet Use one tablet per rectum every 6 hours as needed for pelvic pain (Patient not taking: Reported on  04/03/2022)   EPINEPHrine (EPIPEN 2-PAK) 0.3 mg/0.3 mL IJ SOAJ injection Inject 0.3 mLs (0.3 mg total) into the muscle as needed for anaphylaxis. (Patient not taking: Reported on 04/10/2022)   mirtazapine (REMERON) 15 MG tablet Take 1 tablet (15 mg total) by mouth at bedtime. (Patient not taking: Reported on 04/10/2022)   promethazine (PHENERGAN) 25 MG tablet Take 1 tablet (25 mg total) by mouth every 6 (six) hours as needed for nausea or vomiting. (Patient not taking: Reported on 04/10/2022)   scopolamine (TRANSDERM-SCOP) 1 MG/3DAYS Place 1 patch (1.5 mg total) onto the skin every 3 (three) days. (Patient not taking: Reported on 04/10/2022)   tadalafil (CIALIS) 5 MG tablet Take 1 tablet (5 mg total) by mouth daily. (Patient not taking: Reported on 04/10/2022)   vedolizumab (ENTYVIO) 300 MG injection INFUSE 300 MG IV every 8 weeks (Patient not taking: Reported on 04/10/2022)   No facility-administered encounter medications on file as of 04/10/2022.    Allergies (verified) Mercaptopurine, Shellfish allergy, Humira [adalimumab], and Wound dressing adhesive   History: Past Medical History:  Diagnosis Date   Acute prostatitis 07/24/2007   Qualifier: Diagnosis of  By: [name] MD, [name]    Allergy    mild   Arthritis    osteoarthritis   Asthma    Blood transfusion without reported diagnosis    BPH (benign prostatic hypertrophy) with urinary obstruction    Crohn's ileitis ([place]) suspected 05/03/2017   Dilated aortic root ([place])    noted on echo 08/2012   Diverticulitis of colon    EPIDIDYMITIS 02/15/2010   Qualifier: Diagnosis of  By: [name] MD, [name]    GERD (gastroesophageal reflux disease)    H/O: GI bleed    Hemorrhoids    Hepatitis 1975   unknown type    HERPES SIMPLEX INFECTION 10/14/2007   Qualifier: Diagnosis of  By: [name] MD, [name]    Hiatal hernia    Ileus following gastrointestinal surgery ([place]) 12/26/2011   Long term (current) use of systemic steroids 06/18/2018   Psoriasis    sees  [name] at [facility].   Recurrent ventral incisional hernia 05/10/2012   SVT (supraventricular tachycardia) ([place])    Ulcer 08/21/2013   ileal   Past Surgical History:  Procedure Laterality Date   BOWEL RESECTION  12/19/2011   Procedure: SMALL BOWEL RESECTION;  Surgeon: [name], MD;  Location: [facility];  Service: General;  Laterality: N/A;  with anastamosis and insertion mesh   BRONCHOSCOPY     COLON SURGERY  01/2004   x 2   COLONOSCOPY W/ BIOPSIES  04/26/2017   per [name], no polyps, benign inflammation, repeat in 5 yrs    CYSTOSCOPY     ESOPHAGOGASTRODUODENOSCOPY     HEMICOLECTOMY     left side, at [facility], diverticulitis   [place]     [phone] incisional hernia   ILEOSTOMY     ILEOSTOMY CLOSURE  INSERTION OF MESH  07/31/2012   Procedure: INSERTION OF MESH;  Surgeon: [name], MD;  Location: [facility];  Service: General;;   LAPAROTOMY  12/19/2011   Procedure: EXPLORATORY LAPAROTOMY;  Surgeon: [name], MD;  Location: [facility];  Service: General;  Laterality: N/A;   PACEMAKER IMPLANT N/A 12/01/2020   Procedure: PACEMAKER IMPLANT;  Surgeon: [name], MD;  Location: [facility];  Service: Cardiovascular;  Laterality: N/A;   PACEMAKER INSERTION Left    TONSILLECTOMY     UPPER GASTROINTESTINAL ENDOSCOPY     VASECTOMY     VENTRAL HERNIA REPAIR  07/31/2012   Procedure: HERNIA REPAIR VENTRAL ADULT;  Surgeon: [name], MD;  Location: [facility];  Service: General;  Laterality: N/A;   Family History  Problem Relation Age of Onset   Lung cancer Mother    Leukemia Father    Hypertension Father    Heart disease Father    Heart attack Father    Prostate cancer Father    Prostate cancer Paternal Uncle    Colon cancer Neg Hx    Stomach cancer Neg Hx    Colon polyps Neg Hx    Esophageal cancer Neg Hx    Rectal cancer Neg Hx    Social History   Socioeconomic History   Marital  status: Married    Spouse name: Not on file   Number of children: 2   Years of education: Not on file   Highest education level: Not on file  Occupational History   Occupation: EHS manager    Employer: [employer]  Tobacco Use   Smoking status: Never   Smokeless tobacco: Never  Vaping Use   Vaping Use: Never used  Substance and Sexual Activity   Alcohol use: Yes    Alcohol/week: 0.0 standard drinks of alcohol    Comment: occ   Drug use: Yes    Types: Oxycodone   Sexual activity: Not on file  Other Topics Concern   Not on file  Social History Narrative   He is married with 2 sons 1 son is an Art gallery manager and 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   He is Nurse, 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   Rare if any caffeine   Rare alcohol and never smoker   Social Determinants of Radio broadcast assistant Strain: Low Risk  (04/10/2022)   Overall Financial Resource Strain (CARDIA)    Difficulty of Paying Living Expenses: Not hard at all  Food Insecurity: No Food Insecurity (04/10/2022)   Hunger Vital Sign    Worried About Running Out of Food in the Last Year: Never true    [place] in the Last Year: Never true  Transportation Needs: No Transportation Needs (04/10/2022)   PRAPARE - Hydrologist (Medical): No    Lack of Transportation (Non-Medical): No  Physical Activity: Inactive (04/10/2022)   Exercise Vital Sign    Days of Exercise per Week: 0 days    Minutes of Exercise per Session: 0 min  Stress: Stress Concern Present (04/10/2022)   [place]    Feeling of Stress : To some extent  Social Connections: Not on file    Tobacco Counseling Counseling given: Not Answered   Clinical Intake:  Pre-visit preparation completed: Yes  Pain : 0-10 Pain Score: 6   Pain Type: Chronic pain Pain Location: Abdomen Pain Orientation: Right Pain Descriptors / Indicators: Aching Pain Onset: More than a month ago Pain Frequency: Intermittent     Nutritional Status: BMI of 19-24  Normal Nutritional Risks: Nausea/ vomitting/ diarrhea (has ileostomy) Diabetes: No  How often do you need to have someone help you when you read instructions, pamphlets, or other written materials from your doctor or pharmacy?: 1 - Never What is the last grade level you completed in school?: masters degree  Diabetic? no  Interpreter Needed?: No  Information entered by :: NAllen LPN   Activities of Daily Living    04/10/2022   10:51 AM 01/03/2022   10:35 PM  In your present state of health, do you have any difficulty performing the following activities:  Hearing? 1 0  Vision? 0 0  Difficulty concentrating or making decisions? 0 0  Walking or climbing stairs? 0 0  Dressing or bathing? 0 0  Doing errands, shopping? 0 0  Preparing Food and eating ? N   Using the Toilet? N   In the past six months, have you accidently leaked urine? Y   Do you have problems with loss of bowel control? N   Managing your Medications? N   Managing your Finances? N   Housekeeping or managing your Housekeeping? N     Patient Care Team: [name], MD as PCP - General Curt Bears, Ocie Doyne, MD as PCP - Electrophysiology (Cardiology) Tobi Bastos, RN as [place] any recent Medical Services you may have received from other than [facility] providers in the past year (date may be approximate).     Assessment:   This is a routine wellness examination for Nayel.  Hearing/Vision screen Vision Screening - Comments:: Regular eye exams, [facility]  Dietary issues and exercise activities discussed: Current Exercise Habits: The patient does not participate in regular exercise at present   Goals Addressed             This Visit's Progress     Patient Stated       04/10/2022, no goals       Depression Screen    04/10/2022   10:51 AM 01/18/2022    2:29 PM 10/04/2021   10:18 AM 08/20/2017    9:07 AM  PHQ 2/9 Scores  PHQ - 2 Score 0 2 3 2   PHQ- 9 Score  13 14     Fall Risk    04/10/2022   10:50 AM 01/18/2022    2:29 PM 10/04/2021   10:17 AM 08/01/2021   10:08 AM  Fall Risk   Falls in the past year? 0 0 0 0  Number falls in past yr: 0 0 0 0  Injury with Fall? 0 0 0 0  Risk for fall due to : Impaired balance/gait;Impaired mobility;Medication side effect No Fall Risks No Fall Risks No Fall Risks  Follow up Falls prevention discussed;Education provided;Falls evaluation completed Falls evaluation completed      FALL RISK PREVENTION PERTAINING TO THE HOME:  Any stairs in or around the home? Yes  If so, are there any without handrails? No  Home free of loose throw rugs in walkways, pet beds, electrical cords, etc? Yes  Adequate lighting in your home to reduce risk of falls? Yes   ASSISTIVE DEVICES UTILIZED TO PREVENT FALLS:  Life alert? No  Use of a cane, walker or w/c? Yes  Grab  bars in the bathroom? No  Shower chair or bench in shower? Yes  Elevated toilet seat or a handicapped toilet? No   TIMED UP AND GO:  Was the test performed? No .      Cognitive Function:        04/10/2022   10:52 AM  6CIT Screen  What Year? 0 points  What month? 0 points  What time? 0 points  Count back from 20 0 points  Months in reverse 0 points  Repeat phrase 2 points  Total Score 2 points    Immunizations Immunization History  Administered Date(s) Administered   Influenza Split 07/16/2012   Influenza,inj,Quad PF,6+ Mos 05/16/2013, 04/22/2014   Influenza-Unspecified 04/25/2019   Moderna Sars-Covid-2 Vaccination 11/03/2020   PFIZER(Purple Top)SARS-COV-2 Vaccination 08/22/2019, 09/12/2019, 03/24/2020   Td 07/17/1994    TDAP status: Due, Education has been provided regarding the importance of this vaccine. Advised may  receive this vaccine at local pharmacy or Health Dept. Aware to provide a copy of the vaccination record if obtained from local pharmacy or Health Dept. Verbalized acceptance and understanding.  Flu Vaccine status: Due, Education has been provided regarding the importance of this vaccine. Advised may receive this vaccine at local pharmacy or Health Dept. Aware to provide a copy of the vaccination record if obtained from local pharmacy or Health Dept. Verbalized acceptance and understanding.  Pneumococcal vaccine status: Due, Education has been provided regarding the importance of this vaccine. Advised may receive this vaccine at local pharmacy or Health Dept. Aware to provide a copy of the vaccination record if obtained from local pharmacy or Health Dept. Verbalized acceptance and understanding.  Covid-19 vaccine status: Completed vaccines  Qualifies for Shingles Vaccine? Yes   Zostavax completed No   Shingrix Completed?: No.    Education has been provided regarding the importance of this vaccine. Patient has been advised to call insurance company to determine out of pocket expense if they have not yet received this vaccine. Advised may also receive vaccine at local pharmacy or Health Dept. Verbalized acceptance and understanding.  Screening Tests Health Maintenance  Topic Date Due   Pneumonia Vaccine 33+ Years old (1 - PCV) Never done   COVID-19 Vaccine (5 - Pfizer risk series) 12/29/2020   INFLUENZA VACCINE  02/14/2022   TETANUS/TDAP  04/26/2022 (Originally 07/17/2004)   Zoster Vaccines- Shingrix (1 of 2) 04/26/2022 (Originally 01/10/1970)   COLONOSCOPY (Pts 45-63yr Insurance coverage will need to be confirmed)  09/21/2031   Hepatitis C Screening  Completed   HPV VACCINES  Aged Out    Health Maintenance  Health Maintenance Due  Topic Date Due   Pneumonia Vaccine 71 Years old (1 - PCV) Never done   COVID-19 Vaccine (5 - Pfizer risk series) 12/29/2020   INFLUENZA VACCINE  02/14/2022     Colorectal cancer screening: Type of screening: Colonoscopy. Completed 09/20/2021. Repeat every 5 years  Lung Cancer Screening: (Low Dose CT Chest recommended if Age 69-80years, 30 pack-year currently smoking OR have quit w/in 15years.) does not qualify.   Lung Cancer Screening Referral: no  Additional Screening:  Hepatitis C Screening: does qualify; Completed 10/07/2009  Vision Screening: Recommended annual ophthalmology exams for early detection of glaucoma and other disorders of the eye. Is the patient up to date with their annual eye exam?  Yes  Who is the provider or what is the name of the office in which the patient attends annual eye exams? [facility] If pt is not established with a provider, would  they like to be referred to a provider to establish care? No .   Dental Screening: Recommended annual dental exams for proper oral hygiene  Community Resource Referral / Chronic Care Management: CRR required this visit?  No   CCM required this visit?  No      Plan:     I have personally reviewed and noted the following in the patient's chart:   Medical and social history Use of alcohol, tobacco or illicit drugs  Current medications and supplements including opioid prescriptions. Patient is currently taking opioid prescriptions. Information provided to patient regarding non-opioid alternatives. Patient advised to discuss non-opioid treatment plan with their provider. Functional ability and status Nutritional status Physical activity Advanced directives List of other physicians Hospitalizations, surgeries, and ER visits in previous 12 months Vitals Screenings to include cognitive, depression, and falls Referrals and appointments  In addition, I have reviewed and discussed with patient certain preventive protocols, quality metrics, and best practice recommendations. A written personalized care plan for preventive services as well as general preventive health  recommendations were provided to patient.     Kellie Simmering, LPN   [id number]   Nurse Notes: Patient states that he is currently in the hospital.  Due to this being a virtual visit, the after visit summary with patients personalized plan was offered to patient via mail or my-chart. Patient would like to access on my-chart

## 2022-04-10 NOTE — Patient Instructions (Signed)
[name] , Thank you for taking time to come for your Medicare Wellness Visit. I appreciate your ongoing commitment to your health goals. Please review the following plan we discussed and let me know if I can assist you in the future.   Screening recommendations/referrals: Colonoscopy: completed 09/20/2021, due 09/20/2024 Recommended yearly ophthalmology/optometry visit for glaucoma screening and checkup Recommended yearly dental visit for hygiene and checkup  Vaccinations: Influenza vaccine: due Pneumococcal vaccine: due Tdap vaccine: due Shingles vaccine: discussed   Covid-19:  11/03/2020, 03/24/2020, 09/12/2019, 08/22/2019  Advanced directives: Advance directive discussed with you today.   Conditions/risks identified: patient currently in the hospital  Next appointment: Follow up in one year for your annual wellness visit.   Preventive Care 30 Years and Older, Male Preventive care refers to lifestyle choices and visits with your health care provider that can promote health and wellness. What does preventive care include? A yearly physical exam. This is also called an annual well check. Dental exams once or twice a year. Routine eye exams. Ask your health care provider how often you should have your eyes checked. Personal lifestyle choices, including: Daily care of your teeth and gums. Regular physical activity. Eating a healthy diet. Avoiding tobacco and drug use. Limiting alcohol use. Practicing safe sex. Taking low doses of aspirin every day. Taking vitamin and mineral supplements as recommended by your health care provider. What happens during an annual well check? The services and screenings done by your health care provider during your annual well check will depend on your age, overall health, lifestyle risk factors, and family history of disease. Counseling  Your health care provider may ask you questions about your: Alcohol use. Tobacco use. Drug use. Emotional  well-being. Home and relationship well-being. Sexual activity. Eating habits. History of falls. Memory and ability to understand (cognition). Work and work Statistician. Screening  You may have the following tests or measurements: Height, weight, and BMI. Blood pressure. Lipid and cholesterol levels. These may be checked every 5 years, or more frequently if you are over 62 years old. Skin check. Lung cancer screening. You may have this screening every year starting at age 46 if you have a 30-pack-year history of smoking and currently smoke or have quit within the past 15 years. Fecal occult blood test (FOBT) of the stool. You may have this test every year starting at age 34. Flexible sigmoidoscopy or colonoscopy. You may have a sigmoidoscopy every 5 years or a colonoscopy every 10 years starting at age 33. Prostate cancer screening. Recommendations will vary depending on your family history and other risks. Hepatitis C blood test. Hepatitis B blood test. Sexually transmitted disease (STD) testing. Diabetes screening. This is done by checking your blood sugar (glucose) after you have not eaten for a while (fasting). You may have this done every 1-3 years. Abdominal aortic aneurysm (AAA) screening. You may need this if you are a current or former smoker. Osteoporosis. You may be screened starting at age 42 if you are at high risk. Talk with your health care provider about your test results, treatment options, and if necessary, the need for more tests. Vaccines  Your health care provider may recommend certain vaccines, such as: Influenza vaccine. This is recommended every year. Tetanus, diphtheria, and acellular pertussis (Tdap, Td) vaccine. You may need a Td booster every 10 years. Zoster vaccine. You may need this after age 46. Pneumococcal 13-valent conjugate (PCV13) vaccine. One dose is recommended after age 5. Pneumococcal polysaccharide (PPSV23) vaccine. One dose  is recommended after  age 66. Talk to your health care provider about which screenings and vaccines you need and how often you need them. This information is not intended to replace advice given to you by your health care provider. Make sure you discuss any questions you have with your health care provider. Document Released: 07/30/2015 Document Revised: 03/22/2016 Document Reviewed: 05/04/2015 Elsevier Interactive Patient Education  2017 [place] Prevention in the Home Falls can cause injuries. They can happen to people of all ages. There are many things you can do to make your home safe and to help prevent falls. What can I do on the outside of my home? Regularly fix the edges of walkways and driveways and fix any cracks. Remove anything that might make you trip as you walk through a door, such as a raised step or threshold. Trim any bushes or trees on the path to your home. Use bright outdoor lighting. Clear any walking paths of anything that might make someone trip, such as rocks or tools. Regularly check to see if handrails are loose or broken. Make sure that both sides of any steps have handrails. Any raised decks and porches should have guardrails on the edges. Have any leaves, snow, or ice cleared regularly. Use sand or salt on walking paths during winter. Clean up any spills in your garage right away. This includes oil or grease spills. What can I do in the bathroom? Use night lights. Install grab bars by the toilet and in the tub and shower. Do not use towel bars as grab bars. Use non-skid mats or decals in the tub or shower. If you need to sit down in the shower, use a plastic, non-slip stool. Keep the floor dry. Clean up any water that spills on the floor as soon as it happens. Remove soap buildup in the tub or shower regularly. Attach bath mats securely with double-sided non-slip rug tape. Do not have throw rugs and other things on the floor that can make you trip. What can I do in the  bedroom? Use night lights. Make sure that you have a light by your bed that is easy to reach. Do not use any sheets or blankets that are too big for your bed. They should not hang down onto the floor. Have a firm chair that has side arms. You can use this for support while you get dressed. Do not have throw rugs and other things on the floor that can make you trip. What can I do in the kitchen? Clean up any spills right away. Avoid walking on wet floors. Keep items that you use a lot in easy-to-reach places. If you need to reach something above you, use a strong step stool that has a grab bar. Keep electrical cords out of the way. Do not use floor polish or wax that makes floors slippery. If you must use wax, use non-skid floor wax. Do not have throw rugs and other things on the floor that can make you trip. What can I do with my stairs? Do not leave any items on the stairs. Make sure that there are handrails on both sides of the stairs and use them. Fix handrails that are broken or loose. Make sure that handrails are as long as the stairways. Check any carpeting to make sure that it is firmly attached to the stairs. Fix any carpet that is loose or worn. Avoid having throw rugs at the top or bottom of the stairs.  If you do have throw rugs, attach them to the floor with carpet tape. Make sure that you have a light switch at the top of the stairs and the bottom of the stairs. If you do not have them, ask someone to add them for you. What else can I do to help prevent falls? Wear shoes that: Do not have high heels. Have rubber bottoms. Are comfortable and fit you well. Are closed at the toe. Do not wear sandals. If you use a stepladder: Make sure that it is fully opened. Do not climb a closed stepladder. Make sure that both sides of the stepladder are locked into place. Ask someone to hold it for you, if possible. Clearly mark and make sure that you can see: Any grab bars or  handrails. First and last steps. Where the edge of each step is. Use tools that help you move around (mobility aids) if they are needed. These include: Canes. Walkers. Scooters. Crutches. Turn on the lights when you go into a dark area. Replace any light bulbs as soon as they burn out. Set up your furniture so you have a clear path. Avoid moving your furniture around. If any of your floors are uneven, fix them. If there are any pets around you, be aware of where they are. Review your medicines with your doctor. Some medicines can make you feel dizzy. This can increase your chance of falling. Ask your doctor what other things that you can do to help prevent falls. This information is not intended to replace advice given to you by your health care provider. Make sure you discuss any questions you have with your health care provider. Document Released: 04/29/2009 Document Revised: 12/09/2015 Document Reviewed: 08/07/2014 Elsevier Interactive Patient Education  2017 [employer].

## 2022-04-12 NOTE — Unmapped External Note (Signed)
-------------------------------------------------------------------------------   Attestation signed by Justin Charlie Riis, MD at 04/12/2022  3:38 PM I reviewed the patient's status and agree with the plan of care as documented by the resident.   Electronically Signed by: Charlie Callahan Harlene, MD, Attending Physician 04/12/2022 3:38 PM  -------------------------------------------------------------------------------  Cardiology plan of care note   Assessment/Plan: 71 yo male with SSS s/p dual chamber ppm 2022, bifasicular bundle branch block, SVT, crohn's disease and complex past surgical history including ex lap, SBR, LOA, mesh explantation, DLI in April 2023who presentswith stoma retraction, decreased stoma output, and decreased oral intake x5 days. Hospital course c/b chest pain with ?EKG changes for which cardiology is consulted. Review of EKG trends show at baseline patient has RBBB with most recent EKG notable for bifascicular block with RBBB and LAFB consistent with patient's prior history of bifascicular block, no evidence of acute ischemia. Follow up troponin 7. TTE showed overall normal LV function although unable to fully assess LV regional wall motion. Abbott dual chamber ppm interrogation showed no arrhythmias while patient has been hospitalized, device functioning as anticipated.  #Chest pain #AKI #Hyponatremia #Mild transaminitis   Recommendations: -Given improving clinical presentation with negative troponins and overall unremarkable LV function, no further inpatient ischemic workup warranted at this time -Chest pain likely non-cardiac in origin, epigastric and nonradiating at this time. Consider d-dimer, lipase, CXR, increasing PPI for further deliniation of epigastric pain -Patient would benefit from increase PO intake from a cardiology perspective -Replete lytes prn. Electrolyte goal Mg>2, K>4   Cardiology will sign off. Please refer to attending addendum for final  recommendations.    Electronically signed by: Ellouise Reyes Barter, MD Resident 04/12/22 1110    Electronically signed by: Justin Charlie Riis, MD 04/12/22 1538

## 2022-04-14 ENCOUNTER — Telehealth: Payer: Self-pay | Admitting: *Deleted

## 2022-04-14 NOTE — Patient Outreach (Signed)
  Care Coordination   04/14/2022 Name: KASSIM GUERTIN MRN: [id number] DOB: Sep 29, 1950   Care Coordination Outreach Attempts:  A second unsuccessful outreach was attempted today to offer the patient with information about available care coordination services as a benefit of their health plan.     Follow Up Plan:  Additional outreach attempts will be made to offer the patient care coordination information and services.   Encounter Outcome:  No Answer  Care Coordination Interventions Activated:  No   Care Coordination Interventions:  No, not indicated    Raina Mina, RN Care Management Coordinator [place] Office [phone]

## 2022-04-24 ENCOUNTER — Other Ambulatory Visit: Payer: Self-pay | Admitting: *Deleted

## 2022-04-24 ENCOUNTER — Telehealth: Payer: Self-pay | Admitting: Cardiology

## 2022-04-24 NOTE — Patient Outreach (Signed)
Per [facility] Mr. Balfour resides in [facility]. Screening for potential [facility] care coordination services as benefit of insurance plan and PCP.   Secured communication sent to [facility] to make aware writer is following for transition plans and potential [facility] needs.   Will continue to follow.   Marthenia Rolling, MSN, RN,BSN [place] Acute Care Coordinator [phone] (Direct dial)

## 2022-04-24 NOTE — Telephone Encounter (Signed)
Patient states he received a letter regarding a missed transmission on 9/05. [facility]. Patient was recently admitted in the hospital and missed transmission during that time. Patient would like to know how to proceed.

## 2022-04-24 NOTE — Telephone Encounter (Signed)
The patient states he is still in [place] and will be there another month. I told him he is on schedule to send one in December. We can just wait until then.

## 2022-04-25 ENCOUNTER — Telehealth: Payer: Self-pay | Admitting: Family Medicine

## 2022-04-25 DIAGNOSIS — M6289 Other specified disorders of muscle: Secondary | ICD-10-CM

## 2022-04-25 NOTE — Telephone Encounter (Signed)
Please advise 

## 2022-04-25 NOTE — Telephone Encounter (Signed)
Pt is calling and said [facility] at [phone] not see males

## 2022-04-25 NOTE — Telephone Encounter (Signed)
I understand. He can either just use the Diazepam rectal pills or I can try to find a pelvic floor dysfunction clinic for men at [facility] if he wishes

## 2022-04-26 NOTE — Telephone Encounter (Signed)
Spoke with patient, he is currently a patient at [facility] from the 7 last days.  He requested  for referral to be sent to Pelvis floor dysfunction clinic at [facility].

## 2022-04-27 ENCOUNTER — Emergency Department (HOSPITAL_COMMUNITY)
Admission: EM | Admit: 2022-04-27 | Discharge: 2022-04-27 | Disposition: A | Discharge status: Skilled Nursing Facility | Payer: Medicare Other | Source: Home / Self Care | Attending: Emergency Medicine | Admitting: Emergency Medicine

## 2022-04-27 ENCOUNTER — Emergency Department (HOSPITAL_COMMUNITY): Payer: Medicare Other

## 2022-04-27 ENCOUNTER — Other Ambulatory Visit: Payer: Self-pay

## 2022-04-27 ENCOUNTER — Encounter (HOSPITAL_COMMUNITY): Payer: Self-pay

## 2022-04-27 DIAGNOSIS — E162 Hypoglycemia, unspecified: Secondary | ICD-10-CM | POA: Diagnosis not present

## 2022-04-27 DIAGNOSIS — R1084 Generalized abdominal pain: Secondary | ICD-10-CM | POA: Insufficient documentation

## 2022-04-27 DIAGNOSIS — Z7951 Long term (current) use of inhaled steroids: Secondary | ICD-10-CM | POA: Insufficient documentation

## 2022-04-27 DIAGNOSIS — R112 Nausea with vomiting, unspecified: Secondary | ICD-10-CM | POA: Insufficient documentation

## 2022-04-27 DIAGNOSIS — M6289 Other specified disorders of muscle: Secondary | ICD-10-CM | POA: Insufficient documentation

## 2022-04-27 DIAGNOSIS — J45909 Unspecified asthma, uncomplicated: Secondary | ICD-10-CM | POA: Insufficient documentation

## 2022-04-27 LAB — CBC WITH DIFFERENTIAL/PLATELET
Abs Immature Granulocytes: 0.01 10*3/uL (ref 0.00–0.07)
Basophils Absolute: 0 10*3/uL (ref 0.0–0.1)
Basophils Relative: 0 %
Eosinophils Absolute: 0.1 10*3/uL (ref 0.0–0.5)
Eosinophils Relative: 2 %
HCT: 33 % — ABNORMAL LOW (ref 39.0–52.0)
Hemoglobin: 10 g/dL — ABNORMAL LOW (ref 13.0–17.0)
Immature Granulocytes: 0 %
Lymphocytes Relative: 38 %
Lymphs Abs: 2.4 10*3/uL (ref 0.7–4.0)
MCH: 24.4 pg — ABNORMAL LOW (ref 26.0–34.0)
MCHC: 30.3 g/dL (ref 30.0–36.0)
MCV: 80.7 fL (ref 80.0–100.0)
Monocytes Absolute: 0.5 10*3/uL (ref 0.1–1.0)
Monocytes Relative: 8 %
Neutro Abs: 3.2 10*3/uL (ref 1.7–7.7)
Neutrophils Relative %: 52 %
Platelets: 175 10*3/uL (ref 150–400)
RBC: 4.09 MIL/uL — ABNORMAL LOW (ref 4.22–5.81)
RDW: 17.4 % — ABNORMAL HIGH (ref 11.5–15.5)
WBC: 6.2 10*3/uL (ref 4.0–10.5)
nRBC: 0 % (ref 0.0–0.2)

## 2022-04-27 LAB — LACTIC ACID, PLASMA: Lactic Acid, Venous: 1 mmol/L (ref 0.5–1.9)

## 2022-04-27 LAB — COMPREHENSIVE METABOLIC PANEL
ALT: 18 U/L (ref 0–44)
AST: 26 U/L (ref 15–41)
Albumin: 3.2 g/dL — ABNORMAL LOW (ref 3.5–5.0)
Alkaline Phosphatase: 73 U/L (ref 38–126)
Anion gap: 4 — ABNORMAL LOW (ref 5–15)
BUN: 25 mg/dL — ABNORMAL HIGH (ref 8–23)
CO2: 25 mmol/L (ref 22–32)
Calcium: 8.8 mg/dL — ABNORMAL LOW (ref 8.9–10.3)
Chloride: 106 mmol/L (ref 98–111)
Creatinine, Ser: 0.8 mg/dL (ref 0.61–1.24)
GFR, Estimated: >60 mL/min (ref >60)
Glucose, Bld: 88 mg/dL (ref 70–99)
Potassium: 3.9 mmol/L (ref 3.5–5.1)
Sodium: 135 mmol/L (ref 135–145)
Total Bilirubin: 0.5 mg/dL (ref 0.3–1.2)
Total Protein: 6.5 g/dL (ref 6.5–8.1)

## 2022-04-27 LAB — URINALYSIS, ROUTINE W REFLEX MICROSCOPIC
Bilirubin Urine: NEGATIVE
Glucose, UA: NEGATIVE mg/dL
Hgb urine dipstick: NEGATIVE
Ketones, ur: NEGATIVE mg/dL
Leukocytes,Ua: NEGATIVE
Nitrite: NEGATIVE
Protein, ur: NEGATIVE mg/dL
Specific Gravity, Urine: 1.028 (ref 1.005–1.030)
pH: 5 (ref 5.0–8.0)

## 2022-04-27 LAB — LIPASE, BLOOD: Lipase: 42 U/L (ref 11–51)

## 2022-04-27 MED ORDER — OXYCODONE HCL 5 MG PO TABS
10.0000 mg | ORAL_TABLET | Freq: Once | ORAL | Status: AC
  Administered 2022-04-27: 10 mg via ORAL
  Filled 2022-04-27: qty 2

## 2022-04-27 MED ORDER — ONDANSETRON 4 MG PO TBDP
4.0000 mg | ORAL_TABLET | Freq: Once | ORAL | Status: AC
  Administered 2022-04-27: 4 mg via ORAL
  Filled 2022-04-27: qty 1

## 2022-04-27 MED ORDER — OXYCODONE-ACETAMINOPHEN 5-325 MG PO TABS
1.0000 | ORAL_TABLET | Freq: Once | ORAL | Status: AC
  Administered 2022-04-27: 1 via ORAL
  Filled 2022-04-27: qty 1

## 2022-04-27 MED ORDER — IOHEXOL 300 MG/ML  SOLN
100.0000 mL | Freq: Once | INTRAMUSCULAR | Status: AC | PRN
  Administered 2022-04-27: 100 mL via INTRAVENOUS

## 2022-04-27 NOTE — ED Notes (Signed)
Attempted to call report to facility, left vm for [address] to return call for report

## 2022-04-27 NOTE — Telephone Encounter (Signed)
Patient was seen in the ED today.   Sent my chart message notifying referral has been sent.

## 2022-04-27 NOTE — Telephone Encounter (Signed)
I did the referral to [facility]. They treat men and women

## 2022-04-27 NOTE — ED Triage Notes (Signed)
Per EMS- Patient is from [facility]. Patient c/o abdominal pain and constipation. Patient had a CT scan and staff reports that the scan showed stool in the colon.

## 2022-04-27 NOTE — ED Notes (Signed)
Attempted to call report to facility, rcvd vm that [address] is unavailable to take a call at this time

## 2022-04-27 NOTE — ED Notes (Signed)
Ptar called

## 2022-04-27 NOTE — ED Provider Notes (Signed)
[facility] DEPT Provider Note   CSN: [id number] Arrival date & time: 04/27/22  1213     History  Chief Complaint  Patient presents with   Abdominal Pain   Constipation    [name] is a 71 y.o. male.  71 year old male presents with abdominal pain, nausea, vomiting.  Past medical history of Crohn's disease and complex past surgical history including ex lap with small bowel resection, lysis of adhesion, mesh explantation, and diverting loop ileostomy in April 2023. He was readmitted to the colorectal service on September 24 with failure to thrive and stoma retraction.  Presents today with worsening abdominal pain, had x-ray at facility with concern for increased amount of stool in the colon which prompted facility to send patient to the ER today.  He also reports dysuria and discomfort in his perineum.       Home Medications Prior to Admission medications   Medication Sig Start Date End Date Taking? Authorizing Provider  albuterol (PROVENTIL HFA;VENTOLIN HFA) 108 (90 Base) MCG/ACT inhaler INHALE 2 PUFFS INTO THE LUNGS EVERY 4 (FOUR) HOURS AS NEEDED FOR WHEEZING OR SHORTNESS OF BREATH. 10/24/18   [name], MD  amoxicillin-clavulanate (AUGMENTIN) 400-57 MG chewable tablet Chew 2 tablets by mouth 2 (two) times daily. Patient not taking: Reported on 04/10/2022 03/28/22   [name], MD  diazepam (VALIUM) 10 MG tablet Use one tablet per rectum every 6 hours as needed for pelvic pain Patient not taking: Reported on 04/03/2022 03/28/22   [name], MD  diphenoxylate-atropine (LOMOTIL) 2.5-0.025 MG tablet 1-2 tablets 4 times a day as neded 08/15/21   Gatha Mayer, MD  EPINEPHrine (EPIPEN 2-PAK) 0.3 mg/0.3 mL IJ SOAJ injection Inject 0.3 mLs (0.3 mg total) into the muscle as needed for anaphylaxis. Patient not taking: Reported on 04/10/2022 11/09/18   Sherwood Gambler, MD  fluticasone (CUTIVATE) 0.05 % cream Apply 1 application topically 2 (two)  times daily as needed for irritation. 11/02/20   [provider]  halobetasol (ULTRAVATE) 0.05 % ointment Apply topically 2 (two) times daily. Patient taking differently: Apply 1 application  topically daily as needed (for psoriasis). 01/08/18   [name], MD  hydrocortisone (ANUSOL-HC) 25 MG suppository Place 1 suppository (25 mg total) rectally 2 (two) times daily. 02/02/22   [name], MD  hyoscyamine (LEVSIN SL) 0.125 MG SL tablet PLACE 1 TABLET UNDER THE TONGUE EVERY 4 HOURS AS NEEDED FOR CRAMPING (URGENT DEFECATION) Patient taking differently: Take 0.125 mg by mouth every 4 (four) hours as needed for cramping (for cramping (urgent defecation)). 08/15/21   Gatha Mayer, MD  ketoconazole (NIZORAL) 2 % cream APPLY TWICE A DAY AS NEEDED FOR FUNGAL INFECTION 01/18/22   [name], MD  loperamide (IMODIUM) 2 MG capsule Take 2 capsules (4 mg total) by mouth 3 (three) times daily before meals. 01/06/22   Eugenie Filler, MD  losartan (COZAAR) 25 MG tablet Take 1 tablet (25 mg total) by mouth daily. Call and schedule follow up visit for further refills. [phone]. 2nd attempt 01/09/22   Eugenie Filler, MD  mirtazapine (REMERON) 15 MG tablet Take 1 tablet (15 mg total) by mouth at bedtime. Patient not taking: Reported on 04/10/2022 01/18/22   Gatha Mayer, MD  Multiple Vitamins-Minerals (MULTIVITAMIN WITH MINERALS) tablet Take 1 tablet by mouth daily.    [provider]  Oxycodone HCl 10 MG TABS Take 1 tablet (10 mg total) by mouth every 6 (six)  hours as needed (pain). 03/28/22   [name], MD  pantoprazole (PROTONIX) 40 MG tablet Take 1 tablet (40 mg total) by mouth daily at 6 (six) AM. 01/07/22   Eugenie Filler, MD  pramoxine-hydrocortisone [facility]) 1-1 % rectal cream Place 1 application rectally 2 (two) times daily. Use twice daily before SITZ baths 04/21/19   Milus Banister, MD  promethazine (PHENERGAN) 25 MG tablet Take 1 tablet (25 mg total) by mouth  every 6 (six) hours as needed for nausea or vomiting. Patient not taking: Reported on 04/10/2022 01/18/22   [name], MD  scopolamine (TRANSDERM-SCOP) 1 MG/3DAYS Place 1 patch (1.5 mg total) onto the skin every 3 (three) days. Patient not taking: Reported on 04/10/2022 01/08/22   Eugenie Filler, MD  tadalafil (CIALIS) 5 MG tablet Take 1 tablet (5 mg total) by mouth daily. Patient not taking: Reported on 04/10/2022 11/23/17   [name], MD  traZODone (DESYREL) 100 MG tablet Take 100 mg by mouth at bedtime.    [provider]  vedolizumab (ENTYVIO) 300 MG injection INFUSE 300 MG IV every 8 weeks Patient not taking: Reported on 04/10/2022 08/26/21   Gatha Mayer, MD      Allergies    Mercaptopurine, Shellfish allergy, Humira [adalimumab], and Wound dressing adhesive    Review of Systems   Review of Systems Negative except as per HPI Physical Exam Updated Vital Signs BP 120/61 (BP Location: Right Arm)   Pulse 65   Temp 98.4 F (36.9 C) (Oral)   Resp 16   Ht 5' 10"  (1.778 m)   Wt 65.8 kg   SpO2 100%   BMI 20.81 kg/m  Physical Exam Vitals and nursing note reviewed.  Constitutional:      General: He is not in acute distress.    Appearance: He is well-developed. He is not diaphoretic.  HENT:     Head: Normocephalic and atraumatic.  Cardiovascular:     Rate and Rhythm: Normal rate and regular rhythm.     Heart sounds: Normal heart sounds.  Pulmonary:     Effort: Pulmonary effort is normal.     Breath sounds: Normal breath sounds.  Abdominal:     General: Bowel sounds are normal.     Palpations: Abdomen is soft.     Tenderness: There is no abdominal tenderness.     Comments: Right mid abdomen ostomy   Skin:    General: Skin is warm and dry.     Coloration: Skin is not pale.     Findings: No rash.  Neurological:     Mental Status: He is alert and oriented to person, place, and time.  Psychiatric:        Behavior: Behavior normal.     ED Results /  Procedures / Treatments   Labs (all labs ordered are listed, but only abnormal results are displayed) Labs Reviewed  CBC WITH DIFFERENTIAL/PLATELET - Abnormal; Notable for the following components:      Result Value   RBC 4.09 (*)    Hemoglobin 10.0 (*)    HCT 33.0 (*)    MCH 24.4 (*)    RDW 17.4 (*)    All other components within normal limits  COMPREHENSIVE METABOLIC PANEL - Abnormal; Notable for the following components:   BUN 25 (*)    Calcium 8.8 (*)    Albumin 3.2 (*)    Anion gap 4 (*)    All other components within normal limits  LIPASE,  BLOOD  URINALYSIS, ROUTINE W REFLEX MICROSCOPIC  LACTIC ACID, PLASMA    EKG None  Radiology CT Abdomen Pelvis W Contrast  Result Date: 04/27/2022 CLINICAL DATA:  Abdominal pain, acute, nonlocalized. Progressive abdominal pain with nausea and vomiting. History of Crohn's disease and diverticulitis with multiple previous bowel surgeries. EXAM: CT ABDOMEN AND PELVIS WITH CONTRAST TECHNIQUE: Multidetector CT imaging of the abdomen and pelvis was performed using the standard protocol following bolus administration of intravenous contrast. RADIATION DOSE REDUCTION: This exam was performed according to the departmental dose-optimization program which includes automated exposure control, adjustment of the mA and/or kV according to patient size and/or use of iterative reconstruction technique. CONTRAST:  134m OMNIPAQUE IOHEXOL 300 MG/ML  SOLN COMPARISON:  Abdominopelvic CT 03/13/2022 and 01/24/2022. FINDINGS: Lower chest: Stable subpleural scarring laterally at the right lung base. No significant pleural or pericardial effusion. 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 There is mild distal esophageal wall thickening. Hepatobiliary: The liver is normal in density without suspicious focal abnormality. No evidence of gallstones, gallbladder wall thickening or biliary dilatation. Pancreas: Unremarkable. No pancreatic ductal dilatation or surrounding  inflammatory changes. Spleen: Normal in size without focal abnormality. Adrenals/Urinary Tract: Both adrenal glands appear normal. No evidence of renal or ureteral calculus. Both kidneys appear normal. No hydronephrosis or delayed contrast excretion. As before, possible dependent right-sided bladder calculi. Possible mild bladder wall thickening without other focal abnormality. Stomach/Bowel: No enteric contrast administered. As above, mild distal esophageal wall thickening. The stomach appears unremarkable for its degree of distention. The stomach appears normal for its degree of distention. No small bowel distension or wall thickening is identified. No obvious interval bowel surgery. There is a diverting ileostomy in the right lower quadrant. Evidence of previous partial right colon resection with ileocolonic anastomosis as well as left hemicolectomy with an anastomosis between the transverse and the sigmoid colon. The surrounding inflammatory changes seen in the right lower quadrant on the most recent study have improved. No suspicious extraluminal fluid or definite extraluminal air collections. Vascular/Lymphatic: There are no enlarged abdominal or pelvic lymph nodes. Mild aortic and branch vessel atherosclerosis without evidence of aneurysm or large vessel occlusion. Reproductive: Stable mild enlargement of the prostate gland. Other: Previously demonstrated large fluid collection within the anterior abdominal wall has nearly completely resolved. Otherwise stable postsurgical changes in the anterior abdominal wall. No enlarging intra-abdominal fluid collections or progressive inflammatory changes. No residual pneumoperitoneum. Musculoskeletal: No acute or significant osseous findings. The sacroiliac joints appear unremarkable. IMPRESSION: 1. No acute findings or clear explanation for the patient's symptoms. 2. Interval improvement in previously demonstrated inflammatory changes in the right lower quadrant of the  abdomen with near complete resolution of the large fluid collection in the anterior abdominal wall. No evidence of bowel obstruction or perforation. 3. Stable postsurgical changes as described with a diverting ileostomy in the right lower quadrant. 4. Possible small dependent bladder calculi, as before. 5.  Aortic Atherosclerosis (ICD10-I70.0). Electronically Signed   By: WRichardean SaleM.D.   On: 04/27/2022 15:09    Procedures Procedures    Medications Ordered in ED Medications  iohexol (OMNIPAQUE) 300 MG/ML solution 100 mL (100 mLs Intravenous Contrast Given 04/27/22 1428)  ondansetron (ZOFRAN-ODT) disintegrating tablet 4 mg (4 mg Oral Given 04/27/22 1604)  oxyCODONE-acetaminophen (PERCOCET/ROXICET) 5-325 MG per tablet 1 tablet (1 tablet Oral Given 04/27/22 1604)    ED Course/ Medical Decision Making/ A&P  Medical Decision Making Amount and/or Complexity of Data Reviewed Labs: ordered. Radiology: ordered.  Risk Prescription drug management.   This patient presents to the ED for concern of abdominal pain, nausea, vomiting, this involves an extensive number of treatment options, and is a complaint that carries with it a high risk of complications and morbidity.  The differential diagnosis includes but not limited to bowel obstruction, fistula, intra-abdominal abscess   Co morbidities that complicate the patient evaluation  Asthma, GERD, Crohn's ileitis, prior AKI, ileostomy in place   Additional history obtained:  External records from outside source obtained and reviewed including recent admission to [facility] dated 04/09/22 for AKI, FTT, PICC placed for TPN, dc to facility  Hgb 8.9 on 04/19/22   Lab Tests:  I Ordered, and personally interpreted labs.  The pertinent results include: Lipase within normal limits, CMP without second findings.  Initial lactic acid obtained and reassuring at 1.0, repeat canceled.  Urinalysis normal.  CBC reviewed, hemoglobin  uptrending compared to prior on file.  White count normal.   Imaging Studies ordered:  I ordered imaging studies including CT abdomen pelvis Read by radiology as no acute findings, improvement compared to prior study  Consultations Obtained:  I requested consultation with the ER attending Dr. Maylon Peppers,  and discussed lab and imaging findings as well as pertinent plan - they recommend: Discharge to facility if tolerating p.o.'s and pain improved   Problem List / ED Course / Critical interventions / Medication management  71 year old male presents with complaint of abdominal pain with nausea vomiting.  Patient had a x-ray at his facility showing increase in stool in the colon which prompted evaluation in the emergency room due to his complex history as discussed above.  His work-up today is reassuring including labs and CT scan.  Patient is provided with Zofran and Percocet for pain improved, tolerating p.o.'s.  Plan is for patient to return to facility for ongoing care. I ordered medication including Zofran, Percocet for nausea, pain Reevaluation of the patient after these medicines showed that the patient resolved I have reviewed the patients home medicines and have made adjustments as needed   Social Determinants of Health:  Lives at facility, currently on TPN pending ileostomy takedown   Test / Admission - Considered:  After work-up completed in the emergency room, patient is felt stable for discharge back to facility.         Final Clinical Impression(s) / ED Diagnoses Final diagnoses:  Generalized abdominal pain  Nausea and vomiting, unspecified vomiting type    Rx / DC Orders ED Discharge Orders     None         Tacy Learn, PA-C 04/27/22 1945    Kemper Durie, DO 04/28/22 [phone]

## 2022-04-27 NOTE — Discharge Instructions (Signed)
Recommend close follow-up with your care team at [facility].  Return to ER for severe or concerning symptoms.

## 2022-04-27 NOTE — ED Provider Triage Note (Signed)
Emergency Medicine Provider Triage Evaluation Note  [name] , a 71 y.o. male  was evaluated in triage.  Pt complains of abdominal pain, progressively worsening, nausea and vomiting. At [facility] on TPN in prep for iliostomy take down. Denies fevers or chills. Normal output from ostomy. Hx fistula.  Surgeon at [facility].  Review of Systems  Positive: As above Negative: As above  Physical Exam  BP (!) 109/56   Pulse 63   Temp 98.2 F (36.8 C) (Oral)   Resp 16   Ht 5' 10"  (1.778 m)   Wt 65.8 kg   SpO2 100%   BMI 20.81 kg/m  Gen:   Awake, no distress   Resp:  Normal effort  MSK:   Moves extremities without difficulty  Other:    Medical Decision Making  Medically screening exam initiated at 12:35 PM.  Appropriate orders placed.  Marcelino Duster was informed that the remainder of the evaluation will be completed by another provider, this initial triage assessment does not replace that evaluation, and the importance of remaining in the ED until their evaluation is complete.     Tacy Learn, PA-C 04/27/22 1236

## 2022-04-29 ENCOUNTER — Emergency Department (HOSPITAL_COMMUNITY): Payer: Medicare Other

## 2022-04-29 ENCOUNTER — Inpatient Hospital Stay (HOSPITAL_COMMUNITY)
Admission: EM | Admit: 2022-04-29 | Discharge: 2022-05-03 | DRG: 640 | Disposition: A | Discharge status: Skilled Nursing Facility | Payer: Medicare Other | Source: Skilled Nursing Facility | Attending: Internal Medicine | Admitting: Internal Medicine

## 2022-04-29 ENCOUNTER — Other Ambulatory Visit: Payer: Self-pay

## 2022-04-29 ENCOUNTER — Encounter (HOSPITAL_COMMUNITY): Payer: Self-pay | Admitting: Emergency Medicine

## 2022-04-29 DIAGNOSIS — R112 Nausea with vomiting, unspecified: Secondary | ICD-10-CM

## 2022-04-29 DIAGNOSIS — I959 Hypotension, unspecified: Secondary | ICD-10-CM | POA: Diagnosis present

## 2022-04-29 DIAGNOSIS — N411 Chronic prostatitis: Secondary | ICD-10-CM | POA: Diagnosis present

## 2022-04-29 DIAGNOSIS — Z888 Allergy status to other drugs, medicaments and biological substances status: Secondary | ICD-10-CM

## 2022-04-29 DIAGNOSIS — E162 Hypoglycemia, unspecified: Secondary | ICD-10-CM | POA: Diagnosis not present

## 2022-04-29 DIAGNOSIS — Z8249 Family history of ischemic heart disease and other diseases of the circulatory system: Secondary | ICD-10-CM

## 2022-04-29 DIAGNOSIS — R0789 Other chest pain: Secondary | ICD-10-CM | POA: Diagnosis present

## 2022-04-29 DIAGNOSIS — R3912 Poor urinary stream: Secondary | ICD-10-CM | POA: Diagnosis present

## 2022-04-29 DIAGNOSIS — Z91013 Allergy to seafood: Secondary | ICD-10-CM

## 2022-04-29 DIAGNOSIS — F909 Attention-deficit hyperactivity disorder, unspecified type: Secondary | ICD-10-CM | POA: Diagnosis present

## 2022-04-29 DIAGNOSIS — N401 Enlarged prostate with lower urinary tract symptoms: Secondary | ICD-10-CM | POA: Diagnosis present

## 2022-04-29 DIAGNOSIS — Z932 Ileostomy status: Secondary | ICD-10-CM

## 2022-04-29 DIAGNOSIS — E43 Unspecified severe protein-calorie malnutrition: Secondary | ICD-10-CM | POA: Diagnosis present

## 2022-04-29 DIAGNOSIS — N3289 Other specified disorders of bladder: Secondary | ICD-10-CM | POA: Diagnosis present

## 2022-04-29 DIAGNOSIS — K50918 Crohn's disease, unspecified, with other complication: Secondary | ICD-10-CM | POA: Diagnosis not present

## 2022-04-29 DIAGNOSIS — I700 Atherosclerosis of aorta: Secondary | ICD-10-CM | POA: Diagnosis present

## 2022-04-29 DIAGNOSIS — N400 Enlarged prostate without lower urinary tract symptoms: Secondary | ICD-10-CM | POA: Diagnosis present

## 2022-04-29 DIAGNOSIS — K5090 Crohn's disease, unspecified, without complications: Secondary | ICD-10-CM | POA: Diagnosis present

## 2022-04-29 DIAGNOSIS — Z79899 Other long term (current) drug therapy: Secondary | ICD-10-CM

## 2022-04-29 DIAGNOSIS — Z950 Presence of cardiac pacemaker: Secondary | ICD-10-CM | POA: Diagnosis present

## 2022-04-29 DIAGNOSIS — N210 Calculus in bladder: Secondary | ICD-10-CM | POA: Diagnosis present

## 2022-04-29 DIAGNOSIS — K219 Gastro-esophageal reflux disease without esophagitis: Secondary | ICD-10-CM | POA: Diagnosis present

## 2022-04-29 DIAGNOSIS — R300 Dysuria: Secondary | ICD-10-CM

## 2022-04-29 DIAGNOSIS — Z6822 Body mass index (BMI) 22.0-22.9, adult: Secondary | ICD-10-CM

## 2022-04-29 LAB — BRAIN NATRIURETIC PEPTIDE: B Natriuretic Peptide: 57.7 pg/mL (ref 0.0–100.0)

## 2022-04-29 LAB — CBC WITH DIFFERENTIAL/PLATELET
Abs Immature Granulocytes: 0.04 10*3/uL (ref 0.00–0.07)
Basophils Absolute: 0 10*3/uL (ref 0.0–0.1)
Basophils Relative: 1 %
Eosinophils Absolute: 0 10*3/uL (ref 0.0–0.5)
Eosinophils Relative: 1 %
HCT: 30 % — ABNORMAL LOW (ref 39.0–52.0)
Hemoglobin: 9.2 g/dL — ABNORMAL LOW (ref 13.0–17.0)
Immature Granulocytes: 1 %
Lymphocytes Relative: 25 %
Lymphs Abs: 1.5 10*3/uL (ref 0.7–4.0)
MCH: 24.6 pg — ABNORMAL LOW (ref 26.0–34.0)
MCHC: 30.7 g/dL (ref 30.0–36.0)
MCV: 80.2 fL (ref 80.0–100.0)
Monocytes Absolute: 0.7 10*3/uL (ref 0.1–1.0)
Monocytes Relative: 11 %
Neutro Abs: 3.9 10*3/uL (ref 1.7–7.7)
Neutrophils Relative %: 61 %
Platelets: 154 10*3/uL (ref 150–400)
RBC: 3.74 MIL/uL — ABNORMAL LOW (ref 4.22–5.81)
RDW: 17.3 % — ABNORMAL HIGH (ref 11.5–15.5)
WBC: 6.2 10*3/uL (ref 4.0–10.5)
nRBC: 0 % (ref 0.0–0.2)

## 2022-04-29 LAB — URINALYSIS, ROUTINE W REFLEX MICROSCOPIC
Bilirubin Urine: NEGATIVE
Glucose, UA: 150 mg/dL — AB
Hgb urine dipstick: NEGATIVE
Ketones, ur: NEGATIVE mg/dL
Leukocytes,Ua: NEGATIVE
Nitrite: NEGATIVE
Protein, ur: NEGATIVE mg/dL
Specific Gravity, Urine: <1.005 — ABNORMAL LOW (ref 1.005–1.030)
pH: 5 (ref 5.0–8.0)

## 2022-04-29 LAB — LACTIC ACID, PLASMA
Lactic Acid, Venous: 1.8 mmol/L (ref 0.5–1.9)
Lactic Acid, Venous: 1.2 mmol/L (ref 0.5–1.9)

## 2022-04-29 LAB — LIPASE, BLOOD: Lipase: 41 U/L (ref 11–51)

## 2022-04-29 LAB — COMPREHENSIVE METABOLIC PANEL
ALT: 16 U/L (ref 0–44)
AST: 34 U/L (ref 15–41)
Albumin: 3.2 g/dL — ABNORMAL LOW (ref 3.5–5.0)
Alkaline Phosphatase: 70 U/L (ref 38–126)
Anion gap: 4 — ABNORMAL LOW (ref 5–15)
BUN: 22 mg/dL (ref 8–23)
CO2: 23 mmol/L (ref 22–32)
Calcium: 8.7 mg/dL — ABNORMAL LOW (ref 8.9–10.3)
Chloride: 111 mmol/L (ref 98–111)
Creatinine, Ser: 0.84 mg/dL (ref 0.61–1.24)
GFR, Estimated: >60 mL/min (ref >60)
Glucose, Bld: 45 mg/dL — ABNORMAL LOW (ref 70–99)
Potassium: 3.5 mmol/L (ref 3.5–5.1)
Sodium: 138 mmol/L (ref 135–145)
Total Bilirubin: 0.7 mg/dL (ref 0.3–1.2)
Total Protein: 6.3 g/dL — ABNORMAL LOW (ref 6.5–8.1)

## 2022-04-29 LAB — TROPONIN I (HIGH SENSITIVITY)
Troponin I (High Sensitivity): 6 ng/L (ref <18)
Troponin I (High Sensitivity): 9 ng/L (ref <18)

## 2022-04-29 LAB — MRSA NEXT GEN BY PCR, NASAL: MRSA by PCR Next Gen: DETECTED — AB

## 2022-04-29 LAB — MAGNESIUM: Magnesium: 2 mg/dL (ref 1.7–2.4)

## 2022-04-29 LAB — CBG MONITORING, ED
Glucose-Capillary: 124 mg/dL — ABNORMAL HIGH (ref 70–99)
Glucose-Capillary: 30 mg/dL — CL (ref 70–99)

## 2022-04-29 LAB — TRIGLYCERIDES: Triglycerides: 84 mg/dL (ref <150)

## 2022-04-29 LAB — PHOSPHORUS: Phosphorus: 1.1 mg/dL — ABNORMAL LOW (ref 2.5–4.6)

## 2022-04-29 MED ORDER — MORPHINE SULFATE 15 MG PO TABS
15.0000 mg | ORAL_TABLET | ORAL | Status: DC | PRN
  Administered 2022-04-29 – 2022-05-03 (×17): 15 mg via ORAL
  Filled 2022-04-29 (×18): qty 1

## 2022-04-29 MED ORDER — POLYETHYLENE GLYCOL 3350 17 G PO PACK: 17.0000 g | PACK | Freq: Every day | ORAL | Status: DC | PRN

## 2022-04-29 MED ORDER — EPINEPHRINE 0.3 MG/0.3ML IJ SOAJ: 0.3000 mg | INTRAMUSCULAR | Status: DC | PRN

## 2022-04-29 MED ORDER — SODIUM CHLORIDE 0.9 % IV BOLUS
1000.0000 mL | Freq: Once | INTRAVENOUS | Status: AC
  Administered 2022-04-29: 1000 mL via INTRAVENOUS

## 2022-04-29 MED ORDER — IOHEXOL 350 MG/ML SOLN
100.0000 mL | Freq: Once | INTRAVENOUS | Status: AC | PRN
  Administered 2022-04-29: 100 mL via INTRAVENOUS

## 2022-04-29 MED ORDER — ORAL CARE MOUTH RINSE
15.0000 mL | OROMUCOSAL | Status: DC
  Administered 2022-04-29 – 2022-05-02 (×10): 15 mL via OROMUCOSAL

## 2022-04-29 MED ORDER — DEXTROSE 10 % IV SOLN: INTRAVENOUS | Status: AC

## 2022-04-29 MED ORDER — POTASSIUM PHOSPHATES 15 MMOLE/5ML IV SOLN
30.0000 mmol | Freq: Once | INTRAVENOUS | Status: AC
  Administered 2022-04-29: 30 mmol via INTRAVENOUS
  Filled 2022-04-29: qty 10

## 2022-04-29 MED ORDER — ONDANSETRON HCL 4 MG/2ML IJ SOLN
4.0000 mg | Freq: Once | INTRAMUSCULAR | Status: AC
  Administered 2022-04-29: 4 mg via INTRAVENOUS
  Filled 2022-04-29: qty 2

## 2022-04-29 MED ORDER — FENTANYL CITRATE PF 50 MCG/ML IJ SOSY
50.0000 ug | PREFILLED_SYRINGE | Freq: Once | INTRAMUSCULAR | Status: AC
  Administered 2022-04-29: 50 ug via INTRAVENOUS
  Filled 2022-04-29: qty 1

## 2022-04-29 MED ORDER — DEXTROSE 50 % IV SOLN
INTRAVENOUS | Status: AC
  Administered 2022-04-29: 50 mL
  Filled 2022-04-29: qty 50

## 2022-04-29 MED ORDER — SIMETHICONE 80 MG PO CHEW
80.0000 mg | CHEWABLE_TABLET | Freq: Three times a day (TID) | ORAL | Status: DC
  Administered 2022-04-29 – 2022-05-03 (×17): 80 mg via ORAL
  Filled 2022-04-29 (×18): qty 1

## 2022-04-29 MED ORDER — SENNOSIDES-DOCUSATE SODIUM 8.6-50 MG PO TABS
1.0000 | ORAL_TABLET | Freq: Two times a day (BID) | ORAL | Status: DC
  Administered 2022-04-29 – 2022-05-03 (×7): 1 via ORAL
  Filled 2022-04-29 (×7): qty 1

## 2022-04-29 MED ORDER — TRAZODONE HCL 50 MG PO TABS
100.0000 mg | ORAL_TABLET | Freq: Every day | ORAL | Status: DC
  Administered 2022-04-29 – 2022-05-02 (×4): 100 mg via ORAL
  Filled 2022-04-29 (×4): qty 2

## 2022-04-29 MED ORDER — ALBUTEROL SULFATE (2.5 MG/3ML) 0.083% IN NEBU: 2.5000 mg | INHALATION_SOLUTION | Freq: Four times a day (QID) | RESPIRATORY_TRACT | Status: DC | PRN

## 2022-04-29 MED ORDER — PANTOPRAZOLE SODIUM 40 MG PO TBEC: 40.0000 mg | DELAYED_RELEASE_TABLET | Freq: Every day | ORAL | Status: DC

## 2022-04-29 MED ORDER — CALCIUM CARBONATE ANTACID 500 MG PO CHEW: 500.0000 mg | CHEWABLE_TABLET | Freq: Three times a day (TID) | ORAL | Status: DC | PRN

## 2022-04-29 MED ORDER — SODIUM CHLORIDE 0.9 % IV BOLUS
500.0000 mL | Freq: Once | INTRAVENOUS | Status: AC
  Administered 2022-04-29: 500 mL via INTRAVENOUS

## 2022-04-29 MED ORDER — MELATONIN 3 MG PO TABS
3.0000 mg | ORAL_TABLET | Freq: Every day | ORAL | Status: DC
  Administered 2022-04-29 – 2022-05-02 (×4): 3 mg via ORAL
  Filled 2022-04-29 (×4): qty 1

## 2022-04-29 MED ORDER — ONDANSETRON HCL 4 MG PO TABS: 4.0000 mg | ORAL_TABLET | Freq: Four times a day (QID) | ORAL | Status: DC | PRN

## 2022-04-29 MED ORDER — SORBITOL 70 % SOLN
30.0000 mL | Freq: Every day | Status: DC | PRN
  Filled 2022-04-29: qty 30

## 2022-04-29 MED ORDER — MUPIROCIN 2 % EX OINT
1.0000 | TOPICAL_OINTMENT | Freq: Two times a day (BID) | CUTANEOUS | Status: DC
  Administered 2022-04-29 – 2022-05-03 (×8): 1 via NASAL
  Filled 2022-04-29: qty 22

## 2022-04-29 MED ORDER — PSYLLIUM 95 % PO PACK
1.0000 | PACK | Freq: Every day | ORAL | Status: DC
  Administered 2022-04-30 – 2022-05-03 (×4): 1 via ORAL
  Filled 2022-04-29 (×4): qty 1

## 2022-04-29 MED ORDER — ORAL CARE MOUTH RINSE: 15.0000 mL | OROMUCOSAL | Status: DC | PRN

## 2022-04-29 MED ORDER — SODIUM CHLORIDE 0.9% FLUSH
3.0000 mL | Freq: Two times a day (BID) | INTRAVENOUS | Status: DC
  Administered 2022-04-29 – 2022-05-03 (×9): 3 mL via INTRAVENOUS

## 2022-04-29 MED ORDER — ACETAMINOPHEN 325 MG PO TABS: 650.0000 mg | ORAL_TABLET | Freq: Four times a day (QID) | ORAL | Status: DC | PRN

## 2022-04-29 MED ORDER — BUSPIRONE HCL 5 MG PO TABS
5.0000 mg | ORAL_TABLET | Freq: Three times a day (TID) | ORAL | Status: DC
  Administered 2022-04-29 – 2022-05-03 (×13): 5 mg via ORAL
  Filled 2022-04-29 (×13): qty 1

## 2022-04-29 MED ORDER — DEXTROSE 50 % IV SOLN
50.0000 mL | Freq: Once | INTRAVENOUS | Status: AC
  Administered 2022-04-29: 50 mL via INTRAVENOUS

## 2022-04-29 MED ORDER — SODIUM CHLORIDE (PF) 0.9 % IJ SOLN
INTRAMUSCULAR | Status: AC
  Filled 2022-04-29: qty 50

## 2022-04-29 MED ORDER — ONDANSETRON HCL 4 MG/2ML IJ SOLN
4.0000 mg | Freq: Four times a day (QID) | INTRAMUSCULAR | Status: DC | PRN
  Administered 2022-04-29 – 2022-05-01 (×3): 4 mg via INTRAVENOUS
  Filled 2022-04-29 (×3): qty 2

## 2022-04-29 MED ORDER — CHLORHEXIDINE GLUCONATE CLOTH 2 % EX PADS
6.0000 | MEDICATED_PAD | Freq: Every day | CUTANEOUS | Status: DC
  Administered 2022-04-29 – 2022-05-02 (×3): 6 via TOPICAL

## 2022-04-29 MED ORDER — ALBUTEROL SULFATE HFA 108 (90 BASE) MCG/ACT IN AERS: 2.0000 | INHALATION_SPRAY | Freq: Four times a day (QID) | RESPIRATORY_TRACT | Status: DC | PRN

## 2022-04-29 MED ORDER — HYDROMORPHONE HCL 1 MG/ML IJ SOLN
0.5000 mg | INTRAMUSCULAR | Status: DC | PRN
  Administered 2022-04-29 – 2022-05-03 (×14): 1 mg via INTRAVENOUS
  Filled 2022-04-29 (×15): qty 1

## 2022-04-29 MED ORDER — ENOXAPARIN SODIUM 40 MG/0.4ML IJ SOSY
40.0000 mg | PREFILLED_SYRINGE | Freq: Every day | INTRAMUSCULAR | Status: DC
  Filled 2022-04-29 (×3): qty 0.4

## 2022-04-29 MED ORDER — HYDROMORPHONE HCL 1 MG/ML IJ SOLN: 0.5000 mg | INTRAMUSCULAR | Status: DC | PRN

## 2022-04-29 MED ORDER — MAGNESIUM CITRATE PO SOLN: 1.0000 | Freq: Once | ORAL | Status: DC | PRN

## 2022-04-29 MED ORDER — ACETAMINOPHEN 650 MG RE SUPP: 650.0000 mg | Freq: Four times a day (QID) | RECTAL | Status: DC | PRN

## 2022-04-29 NOTE — ED Provider Notes (Signed)
[facility] DEPT Provider Note   CSN: [id number] Arrival date & time: 04/29/22  1102     History  Chief Complaint  Patient presents with   Abdominal Pain    [name] is a 71 y.o. male.  a 71 year old male with past medical history of Crohn's disease and complex past surgical history including ex lap with small bowel resection, lysis of adhesion, mesh explantation, and diverting loop ileostomy in April 2023. He was readmitted to the colorectal service on September 24 with failure to thrive and stoma retraction.  Patient presents from his facility with abdominal pain, nausea and vomiting and shortness of breath.  This onset after he received a liter of TPN over 1 hour by mistake.  He is supposed to receive TPN at 60 cc/h.  He states after receiving the fluid he had diffuse abdominal pain, aches all over, multiple episodes of dry heaving with nausea and vomiting times about 10.  Also has some shortness of breath and chest tightness as well which is improving.  No tongue or lip swelling.  His abdominal pain is chronic and appears similar to his previous chronic pain exacerbations.  States his ostomy output has been stable.  Does not believe he has had a fever.  Denies having any chest pain or shortness of breath currently and feels like he is breathing better. No rash.   Abdominal Pain Associated symptoms: diarrhea, fatigue, nausea, shortness of breath and vomiting        Home Medications Prior to Admission medications   Medication Sig Start Date End Date Taking? Authorizing Provider  albuterol (PROVENTIL HFA;VENTOLIN HFA) 108 (90 Base) MCG/ACT inhaler INHALE 2 PUFFS INTO THE LUNGS EVERY 4 (FOUR) HOURS AS NEEDED FOR WHEEZING OR SHORTNESS OF BREATH. 10/24/18   [name], MD  amoxicillin-clavulanate (AUGMENTIN) 400-57 MG chewable tablet Chew 2 tablets by mouth 2 (two) times daily. Patient not taking: Reported on 04/10/2022 03/28/22   [name], MD   diazepam (VALIUM) 10 MG tablet Use one tablet per rectum every 6 hours as needed for pelvic pain Patient not taking: Reported on 04/03/2022 03/28/22   [name], MD  diphenoxylate-atropine (LOMOTIL) 2.5-0.025 MG tablet 1-2 tablets 4 times a day as neded 08/15/21   Gatha Mayer, MD  EPINEPHrine (EPIPEN 2-PAK) 0.3 mg/0.3 mL IJ SOAJ injection Inject 0.3 mLs (0.3 mg total) into the muscle as needed for anaphylaxis. Patient not taking: Reported on 04/10/2022 11/09/18   Sherwood Gambler, MD  fluticasone (CUTIVATE) 0.05 % cream Apply 1 application topically 2 (two) times daily as needed for irritation. 11/02/20   [provider]  halobetasol (ULTRAVATE) 0.05 % ointment Apply topically 2 (two) times daily. Patient taking differently: Apply 1 application  topically daily as needed (for psoriasis). 01/08/18   [name], MD  hydrocortisone (ANUSOL-HC) 25 MG suppository Place 1 suppository (25 mg total) rectally 2 (two) times daily. 02/02/22   [name], MD  hyoscyamine (LEVSIN SL) 0.125 MG SL tablet PLACE 1 TABLET UNDER THE TONGUE EVERY 4 HOURS AS NEEDED FOR CRAMPING (URGENT DEFECATION) Patient taking differently: Take 0.125 mg by mouth every 4 (four) hours as needed for cramping (for cramping (urgent defecation)). 08/15/21   Gatha Mayer, MD  ketoconazole (NIZORAL) 2 % cream APPLY TWICE A DAY AS NEEDED FOR FUNGAL INFECTION 01/18/22   [name], MD  loperamide (IMODIUM) 2 MG capsule Take 2 capsules (4 mg total) by mouth 3 (three) times daily before  meals. 01/06/22   Eugenie Filler, MD  losartan (COZAAR) 25 MG tablet Take 1 tablet (25 mg total) by mouth daily. Call and schedule follow up visit for further refills. [phone]. 2nd attempt 01/09/22   Eugenie Filler, MD  mirtazapine (REMERON) 15 MG tablet Take 1 tablet (15 mg total) by mouth at bedtime. Patient not taking: Reported on 04/10/2022 01/18/22   Gatha Mayer, MD  Multiple Vitamins-Minerals (MULTIVITAMIN WITH MINERALS)  tablet Take 1 tablet by mouth daily.    [provider]  Oxycodone HCl 10 MG TABS Take 1 tablet (10 mg total) by mouth every 6 (six) hours as needed (pain). 03/28/22   [name], MD  pantoprazole (PROTONIX) 40 MG tablet Take 1 tablet (40 mg total) by mouth daily at 6 (six) AM. 01/07/22   Eugenie Filler, MD  pramoxine-hydrocortisone [facility]) 1-1 % rectal cream Place 1 application rectally 2 (two) times daily. Use twice daily before SITZ baths 04/21/19   Milus Banister, MD  promethazine (PHENERGAN) 25 MG tablet Take 1 tablet (25 mg total) by mouth every 6 (six) hours as needed for nausea or vomiting. Patient not taking: Reported on 04/10/2022 01/18/22   [name], MD  scopolamine (TRANSDERM-SCOP) 1 MG/3DAYS Place 1 patch (1.5 mg total) onto the skin every 3 (three) days. Patient not taking: Reported on 04/10/2022 01/08/22   Eugenie Filler, MD  tadalafil (CIALIS) 5 MG tablet Take 1 tablet (5 mg total) by mouth daily. Patient not taking: Reported on 04/10/2022 11/23/17   [name], MD  traZODone (DESYREL) 100 MG tablet Take 100 mg by mouth at bedtime.    [provider]  vedolizumab (ENTYVIO) 300 MG injection INFUSE 300 MG IV every 8 weeks Patient not taking: Reported on 04/10/2022 08/26/21   Gatha Mayer, MD      Allergies    Mercaptopurine, Shellfish allergy, Humira [adalimumab], and Wound dressing adhesive    Review of Systems   Review of Systems  Constitutional:  Positive for activity change, appetite change and fatigue.  Respiratory:  Positive for shortness of breath.   Gastrointestinal:  Positive for abdominal pain, diarrhea, nausea and vomiting.  Musculoskeletal:  Positive for arthralgias and myalgias.  Skin:  Negative for rash.  Neurological:  Positive for weakness.   all other systems are negative except as noted in the HPI and PMH.    Physical Exam Updated Vital Signs BP (!) 107/55   Pulse 82   Temp 97.8 F (36.6 C) (Oral)   Ht 5' 10"   (1.778 m)   Wt 70.3 kg   SpO2 95%   BMI 22.24 kg/m  Physical Exam Vitals and nursing note reviewed.  Constitutional:      General: He is not in acute distress.    Appearance: He is well-developed.  HENT:     Head: Normocephalic and atraumatic.     Mouth/Throat:     Pharynx: No oropharyngeal exudate.     Comments: No tongue or lip swelling Eyes:     Conjunctiva/sclera: Conjunctivae normal.     Pupils: Pupils are equal, round, and reactive to light.  Neck:     Comments: No meningismus. Cardiovascular:     Rate and Rhythm: Normal rate and regular rhythm.     Heart sounds: Normal heart sounds. No murmur heard. Pulmonary:     Effort: Pulmonary effort is normal. No respiratory distress.     Breath sounds: Normal breath sounds. No wheezing.  Abdominal:  Palpations: Abdomen is soft.     Tenderness: There is abdominal tenderness. There is no guarding or rebound.     Comments: Abdomen soft, ostomy in place with liquid green stool.  No guarding or rebound  Musculoskeletal:        General: No tenderness. Normal range of motion.     Cervical back: Normal range of motion and neck supple.  Skin:    General: Skin is warm.  Neurological:     Mental Status: He is alert and oriented to person, place, and time.     Cranial Nerves: No cranial nerve deficit.     Motor: No abnormal muscle tone.     Coordination: Coordination normal.     Comments:  5/5 strength throughout. CN 2-12 intact.Equal grip strength.   Psychiatric:        Behavior: Behavior normal.     ED Results / Procedures / Treatments   Labs (all labs ordered are listed, but only abnormal results are displayed) Labs Reviewed  COMPREHENSIVE METABOLIC PANEL - Abnormal; Notable for the following components:      Result Value   Glucose, Bld 45 (*)    Calcium 8.7 (*)    Total Protein 6.3 (*)    Albumin 3.2 (*)    Anion gap 4 (*)    All other components within normal limits  PHOSPHORUS - Abnormal; Notable for the following  components:   Phosphorus 1.1 (*)    All other components within normal limits  CBG MONITORING, ED - Abnormal; Notable for the following components:   Glucose-Capillary 30 (*)    All other components within normal limits  CBG MONITORING, ED - Abnormal; Notable for the following components:   Glucose-Capillary 124 (*)    All other components within normal limits  CULTURE, BLOOD (ROUTINE X 2)  CULTURE, BLOOD (ROUTINE X 2)  URINE CULTURE  MRSA NEXT GEN BY PCR, NASAL  LIPASE, BLOOD  LACTIC ACID, PLASMA  MAGNESIUM  TRIGLYCERIDES  CBC WITH DIFFERENTIAL/PLATELET  LACTIC ACID, PLASMA  URINALYSIS, ROUTINE W REFLEX MICROSCOPIC  BRAIN NATRIURETIC PEPTIDE  CBC WITH DIFFERENTIAL/PLATELET  CBC WITH DIFFERENTIAL/PLATELET  PHOSPHORUS  MAGNESIUM  CBC WITH DIFFERENTIAL/PLATELET  TSH  COMPREHENSIVE METABOLIC PANEL  PROTIME-INR  TRIGLYCERIDES  TROPONIN I (HIGH SENSITIVITY)  TROPONIN I (HIGH SENSITIVITY)    EKG EKG Interpretation  Date/Time:  Saturday April 29 2022 11:37:49 EDT Ventricular Rate:  82 PR Interval:  158 QRS Duration: 98 QT Interval:  384 QTC Calculation: 449 R Axis:   -83 Text Interpretation: Sinus rhythm Left anterior fascicular block Consider right ventricular hypertrophy No significant change was found Confirmed by Ezequiel Essex [phone]) on 04/29/2022 12:34:57 PM  Radiology CT Angio Chest PE W and/or Wo Contrast  Result Date: 04/29/2022 CLINICAL DATA:  Positive D-dimers. Assess for pulmonary embolus. Acute nonlocalized abdomen pain. EXAM: CT ANGIOGRAPHY CHEST CT ABDOMEN AND PELVIS WITH CONTRAST TECHNIQUE: Multidetector CT imaging of the chest was performed using the standard protocol during bolus administration of intravenous contrast. Multiplanar CT image reconstructions and MIPs were obtained to evaluate the vascular anatomy. Multidetector CT imaging of the abdomen and pelvis was performed using the standard protocol during bolus administration of intravenous  contrast. RADIATION DOSE REDUCTION: This exam was performed according to the departmental dose-optimization program which includes automated exposure control, adjustment of the mA and/or kV according to patient size and/or use of iterative reconstruction technique. CONTRAST:  140m OMNIPAQUE IOHEXOL 350 MG/ML SOLN COMPARISON:  CT abdomen and pelvis April 27, 2022, abdomen pelvic  CT September 06, 2018 FINDINGS: CTA CHEST FINDINGS Cardiovascular: Satisfactory opacification of the pulmonary arteries to the segmental level. No evidence of pulmonary embolism. Normal heart size. No pericardial effusion. Mediastinum/Nodes: No enlarged mediastinal, hilar, or axillary lymph nodes. Thyroid gland, trachea, and esophagus demonstrate no significant findings. Lungs/Pleura: There is no focal pneumonia or pulmonary edema. Minimal bilateral pleural effusions are noted. Stable lateral right lower lobe subpleural scar is unchanged compared to prior exams. Minimal dependent atelectasis of posterior lung bases are noted. Musculoskeletal: Degenerative joint changes of the spine are noted. Review of the MIP images confirms the above findings. CT ABDOMEN and PELVIS FINDINGS Hepatobiliary: No focal liver abnormality is seen. No gallstones, gallbladder wall thickening, or biliary dilatation. Pancreas: Unremarkable. No pancreatic ductal dilatation or surrounding inflammatory changes. Spleen: Normal in size without focal abnormality. Adrenals/Urinary Tract: The bilateral adrenal glands are normal. No renal calculi is identified. No focal kidney lesion is noted. No hydronephrosis bilaterally. Dependent bladder calculi identified in a partially decompressed bladder. Stomach/Bowel: A diverting ileostomy is identified in the right lower quadrant. There is evidence of previous partial right colon resection with ileocolonic anastomosis and left hemi colectomy with anastomosis between transverse and sigmoid colon. The surrounding inflammatory change  of the right lower quadrant is not significantly changed compared to prior CT of April 27, 2022. the stomach is normal. No free air is identified. Vascular/Lymphatic: Aortic atherosclerosis. No enlarged abdominal or pelvic lymph nodes. Reproductive: Calcifications identified within the prostate gland measuring 5 cm in diameter. Other: Stable postsurgical changes in the anterior abdominal wall compared to prior exam of April 27, 2022. Musculoskeletal: No acute abnormality. Review of the MIP images confirms the above findings. IMPRESSION: 1. No pulmonary embolus. 2. Minimal bilateral pleural effusions with minimal dependent atelectasis of posterior lung bases. 3. No acute abnormality identified in the abdomen and pelvis. 4. Evidence of previous partial right colon resection with ileocolonic anastomosis and left hemi colectomy with anastomosis between transverse and sigmoid colon. The surrounding inflammatory change of the right lower quadrant is not significantly changed compared to prior CT of April 27, 2022. 5. Aortic atherosclerosis. Aortic Atherosclerosis (ICD10-I70.0). Electronically Signed   By: Abelardo Diesel M.D.   On: 04/29/2022 14:45   DG Chest Portable 1 View  Result Date: 04/29/2022 CLINICAL DATA:  Generalized weakness with chest pain EXAM: PORTABLE CHEST 1 VIEW COMPARISON:  04/09/2022 FINDINGS: Dual-chamber pacer leads from the left in unchanged position, including outward directed right atrial lead. Central line on the right with tip at the upper cavoatrial junction. Artifact from EKG leads. There is no edema, consolidation, effusion, or pneumothorax. IMPRESSION: No acute finding when compared to 04/09/2022 Electronically Signed   By: Jorje Guild M.D.   On: 04/29/2022 12:29    Procedures .Critical Care  Performed by: Ezequiel Essex, MD Authorized by: Ezequiel Essex, MD   Critical care provider statement:    Critical care time (minutes):  45   Critical care time was exclusive  of:  Separately billable procedures and treating other patients   Critical care was necessary to treat or prevent imminent or life-threatening deterioration of the following conditions:  Endocrine crisis (Hypoglycemia, hypotension, lactic acidosis)   Critical care was time spent personally by me on the following activities:  Development of treatment plan with patient or surrogate, discussions with consultants, evaluation of patient's response to treatment, examination of patient, ordering and review of laboratory studies, ordering and review of radiographic studies, ordering and performing treatments and interventions, pulse oximetry, re-evaluation of patient's  condition and review of old charts   I assumed direction of critical care for this patient from another provider in my specialty: no     Care discussed with: admitting provider       Medications Ordered in ED Medications  fentaNYL (SUBLIMAZE) injection 50 mcg (has no administration in time range)  ondansetron (ZOFRAN) injection 4 mg (has no administration in time range)  sodium chloride 0.9 % bolus 1,000 mL (has no administration in time range)    ED Course/ Medical Decision Making/ A&P                           Medical Decision Making Amount and/or Complexity of Data Reviewed Labs: ordered. Decision-making details documented in ED Course. Radiology: ordered and independent interpretation performed. Decision-making details documented in ED Course. ECG/medicine tests: ordered and independent interpretation performed. Decision-making details documented in ED Course.  Risk Prescription drug management. Decision regarding hospitalization.  Complex surgical patient here with nausea, vomiting, abdominal pain and shortness of breath after receiving rapid bolus of TPN.  He is not hypoxic and has no increased work of breathing.  Abdomen is soft without peritoneal signs.  No tongue or lip swelling or evidence of anaphylaxis.BP soft in 90s  but he states this is typical for him.   We will hydrate, check electrolytes, EKG, CBG.  Discussed with Magnus Ivan.D.  He states there is some concern for hyperkalemia or hyperglycemia or hypertriglyceridemia given the electrolytes and the TPN solution.  However no recommended prophylactic treatment.  Recommends laboratory studies to assess for electrolyte disturbances.  Labs with severe hypoglycemia. D50 and d10 gtt given. Hypophos. K is normal. Mag is normal.  CT shows stable postoperative changes without evidence of infection. No pulmonary embolism or pneumonia.   Blood sugar improving to 120s. No CP, SOB, wheezing, rash, tongue or lip swelling. Do not favor anaphylaxis so will hold steroids and antihistamines at this time.   Observation admission for hypoglycemia and electrolyte derangements d/w Dr. Grandville Silos.       Final Clinical Impression(s) / ED Diagnoses Final diagnoses:  Hypoglycemia  Nausea and vomiting, unspecified vomiting type    Rx / DC Orders ED Discharge Orders     None         Dillan Lunden, Annie Main, MD 04/29/22 1709

## 2022-04-29 NOTE — H&P (Signed)
History and Physical    DEAUNDRE ALLSTON ZYS:[id number] DOB: 1950/09/25 DOA: 04/29/2022  PCP: [name], MD  Patient coming from: SNF  I have personally briefly reviewed patient's old medical records in [place]  Chief Complaint: Nausea vomiting abdominal pain  HPI: [name] is a 71 y.o. male with medical history significant of Crohn's disease and complex past surgical history including exploratory laparotomy with small bowel resection, lysis of adhesions, mesh explantation, diverting loop ileostomy April 2023, recently [facility] with failure to thrive and stoma retraction.  Patient started on TPN during that hospitalization in addition to oral intake and discharged to skilled nursing facility.  Patient presenting back from facility with abdominal pain nausea vomiting some shortness of breath and chest tightness which started soon after he received a liter of TPN over 1 hour bolus by mistake.  Patient noted to supposedly be on TPN at 60 cc an hour.  Patient did state that after receiving the fluid he had diffuse abdominal pain, diffuse aches all over, nausea, dry heaves, vomiting, some shortness of breath, chest tightness which had improved at the time of my evaluation.  Patient denied any fevers, no diarrhea or constipation, no melena, no lip or tongue swelling, no difficulty swallowing.  Patient does endorse some lightheadedness, dizziness, dysuria.  Patient denies any penile discharge.  Patient does state ostomy output has been stable.  Chronic abdominal pain also stable.  ED Course: Patient seen in the ED, comprehensive metabolic profile done with a glucose of 45, creatinine of 0.84, potassium of 3.5, calcium of 8.7, albumin of 3.2, protein of 6.3 otherwise within normal limits.  Phosphorus level was 1.1, magnesium at 2.0.  Triglycerides at 84.  High-sensitivity troponin negative.  Lactic acid level at 1.8.  CBC  pending.  Urinalysis pending.  Chest x-ray with no acute cardiopulmonary abnormalities.  CT angiogram chest done negative for PE.  Minimal bilateral pleural effusions with minimal dependent atelectasis of the posterior lung bases.  CT abdomen and pelvis with no acute abnormalities identified, evidence of previous partial right colon resection with ileocolonic anastomosis and left hemicolectomy with anastomosis between transverse and sigmoid colon.  Surrounding inflammatory change of right lower quadrant is not significantly changed compared to prior CT of April 27, 2022.  Aortic atherosclerosis.  Patient noted to have hypotensive/soft blood pressures in the ED with systolics in the 93A with some response to IV fluids.  Patient with no wheezing and no significant shortness of breath and as such not given any IV steroids.  CBG initially noted at 30 and patient placed on a D10 infusion.  Review of Systems: As per HPI otherwise all other systems reviewed and are negative.  Past Medical History:  Diagnosis Date   Acute prostatitis 07/24/2007   Qualifier: Diagnosis of  By: [name] MD, [name]    Allergy    mild   Arthritis    osteoarthritis   Asthma    Blood transfusion without reported diagnosis    BPH (benign prostatic hypertrophy) with urinary obstruction    Crohn's ileitis ([place]) suspected 05/03/2017   Dilated aortic root ([place])    noted on echo 08/2012   Diverticulitis of colon    EPIDIDYMITIS 02/15/2010   Qualifier: Diagnosis of  By: [name] MD, [name]    GERD (gastroesophageal reflux disease)    H/O: GI bleed    Hemorrhoids    Hepatitis 1975   unknown type  HERPES SIMPLEX INFECTION 10/14/2007   Qualifier: Diagnosis of  By: [name] MD, [name]    Hiatal hernia    Ileus following gastrointestinal surgery ([place]) 12/26/2011   Long term (current) use of systemic steroids 06/18/2018   Psoriasis    sees [name] at [facility].   Recurrent ventral incisional hernia 05/10/2012   SVT  (supraventricular tachycardia)    Ulcer 08/21/2013   ileal    Past Surgical History:  Procedure Laterality Date   BOWEL RESECTION  12/19/2011   Procedure: SMALL BOWEL RESECTION;  Surgeon: [name], MD;  Location: [facility];  Service: General;  Laterality: N/A;  with anastamosis and insertion mesh   BRONCHOSCOPY     COLON SURGERY  01/2004   x 2   COLONOSCOPY W/ BIOPSIES  04/26/2017   per [name], no polyps, benign inflammation, repeat in 5 yrs    CYSTOSCOPY     ESOPHAGOGASTRODUODENOSCOPY     HEMICOLECTOMY     left side, at [facility], diverticulitis   [place]     [phone] incisional hernia   ILEOSTOMY     ILEOSTOMY CLOSURE     INSERTION OF MESH  07/31/2012   Procedure: INSERTION OF MESH;  Surgeon: [name], MD;  Location: [facility];  Service: General;;   LAPAROTOMY  12/19/2011   Procedure: EXPLORATORY LAPAROTOMY;  Surgeon: [name], MD;  Location: [facility];  Service: General;  Laterality: N/A;   PACEMAKER IMPLANT N/A 12/01/2020   Procedure: PACEMAKER IMPLANT;  Surgeon: [name], MD;  Location: [place] [facility];  Service: Cardiovascular;  Laterality: N/A;   PACEMAKER INSERTION Left    TONSILLECTOMY     UPPER GASTROINTESTINAL ENDOSCOPY     VASECTOMY     VENTRAL HERNIA REPAIR  07/31/2012   Procedure: HERNIA REPAIR VENTRAL ADULT;  Surgeon: [name], MD;  Location: [facility];  Service: General;  Laterality: N/A;    Social History  reports that he has never smoked. He has never used smokeless tobacco. He reports that he does not currently use alcohol. He reports current drug use. Drug: Oxycodone.  Allergies  Allergen Reactions   Mercaptopurine Other (See Comments)    Pancreatitis    Shellfish Allergy Anaphylaxis    Throat started to close   Humira [Adalimumab]     Developed antibodies   Wound Dressing Adhesive Rash    Family History  Problem Relation Age of Onset   Lung cancer Mother    Leukemia  Father    Hypertension Father    Heart disease Father    Heart attack Father    Prostate cancer Father    Prostate cancer Paternal Uncle    Colon cancer Neg Hx    Stomach cancer Neg Hx    Colon polyps Neg Hx    Esophageal cancer Neg Hx    Rectal cancer Neg Hx    Mother deceased age 23 from lung cancer.  Father deceased age 7 from leukemia.  Prior to Admission medications   Medication Sig Start Date End Date Taking? Authorizing Provider  acetaminophen (TYLENOL) 325 MG tablet Take 650 mg by mouth every 6 (six) hours as needed for moderate pain.   Yes [provider]  albuterol (PROVENTIL HFA;VENTOLIN HFA) 108 (90 Base) MCG/ACT inhaler INHALE 2 PUFFS INTO THE LUNGS EVERY 4 (FOUR) HOURS AS NEEDED FOR WHEEZING OR SHORTNESS OF BREATH. Patient taking differently: Inhale 2 puffs into the lungs every 6 (  six) hours as needed for wheezing or shortness of breath. 10/24/18  Yes [name], MD  Amino Ac Elect-Calc in D15W [facility] E/DEXTROSE, 5/15, IV) Inject 65 mL/hr into the vein continuous. Via powerline TPN IV (104gm aa/183 GM DEC/50GM)   Yes [provider]  busPIRone (BUSPAR) 5 MG tablet Take 5 mg by mouth 3 (three) times daily.   Yes [provider]  calcium carbonate (TUMS - DOSED IN MG ELEMENTAL CALCIUM) 500 MG chewable tablet Chew 500 mg by mouth 3 (three) times daily as needed for indigestion or heartburn.   Yes [provider]  melatonin 3 MG TABS tablet Take 3 mg by mouth at bedtime.   Yes [provider]  morphine (MSIR) 15 MG tablet Take 15 mg by mouth every 4 (four) hours as needed for severe pain.   Yes [provider]  Multiple Vitamin (MULTIVITAMIN WITH MINERALS) TABS tablet Take 1 tablet by mouth daily.   Yes [provider]  omeprazole (PRILOSEC OTC) 20 MG tablet Take 20 mg by mouth daily.   Yes [provider]  ondansetron (ZOFRAN-ODT) 4 MG disintegrating tablet Take 4 mg by mouth every 6 (six) hours as  needed for nausea or vomiting.   Yes [provider]  OVER THE COUNTER MEDICATION Take 1 Bottle by mouth in the morning and at bedtime. Medpass 173m supplement   Yes [provider]  psyllium (HYDROCIL/METAMUCIL) 95 % PACK Take 1 packet by mouth daily.   Yes [provider]  simethicone (MYLICON) 80 MG chewable tablet Chew 80 mg by mouth 4 (four) times daily -  with meals and at bedtime.   Yes [provider]  traZODone (DESYREL) 50 MG tablet Take 100 mg by mouth at bedtime.   Yes [provider]  amoxicillin-clavulanate (AUGMENTIN) 400-57 MG chewable tablet Chew 2 tablets by mouth 2 (two) times daily. Patient not taking: Reported on 04/10/2022 03/28/22   FLaurey Morale MD  diazepam (VALIUM) 10 MG tablet Use one tablet per rectum every 6 hours as needed for pelvic pain Patient not taking: Reported on 04/03/2022 03/28/22   FLaurey Morale MD  EPINEPHrine (EPIPEN 2-PAK) 0.3 mg/0.3 mL IJ SOAJ injection Inject 0.3 mLs (0.3 mg total) into the muscle as needed for anaphylaxis. Patient not taking: Reported on 04/10/2022 11/09/18   GSherwood Gambler MD  halobetasol (ULTRAVATE) 0.05 % ointment Apply topically 2 (two) times daily. Patient not taking: Reported on 04/29/2022 01/08/18   FLaurey Morale MD  hydrocortisone (ANUSOL-HC) 25 MG suppository Place 1 suppository (25 mg total) rectally 2 (two) times daily. Patient not taking: Reported on 04/29/2022 02/02/22   FLaurey Morale MD  ketoconazole (NIZORAL) 2 % cream APPLY TWICE A DAY AS NEEDED FOR FUNGAL INFECTION Patient not taking: Reported on 04/29/2022 01/18/22   FLaurey Morale MD  loperamide (IMODIUM) 2 MG capsule Take 2 capsules (4 mg total) by mouth 3 (three) times daily before meals. Patient not taking: Reported on 04/29/2022 01/06/22   TEugenie Filler MD  losartan (COZAAR) 25 MG tablet Take 1 tablet (25 mg total) by mouth daily. Call and schedule follow up visit for further refills. [phone] 2nd  attempt Patient not taking: Reported on 04/29/2022 01/09/22   TEugenie Filler MD  mirtazapine (REMERON) 15 MG tablet Take 1 tablet (15 mg total) by mouth at bedtime. Patient not taking: Reported on 04/29/2022 01/18/22   GGatha Mayer MD  Oxycodone HCl 10 MG TABS Take 1 tablet (10  mg total) by mouth every 6 (six) hours as needed (pain). Patient not taking: Reported on 04/29/2022 03/28/22   [name], MD  pantoprazole (PROTONIX) 40 MG tablet Take 1 tablet (40 mg total) by mouth daily at 6 (six) AM. Patient not taking: Reported on 04/29/2022 01/07/22   Eugenie Filler, MD  promethazine (PHENERGAN) 25 MG tablet Take 1 tablet (25 mg total) by mouth every 6 (six) hours as needed for nausea or vomiting. Patient not taking: Reported on 04/10/2022 01/18/22   [name], MD  scopolamine (TRANSDERM-SCOP) 1 MG/3DAYS Place 1 patch (1.5 mg total) onto the skin every 3 (three) days. Patient not taking: Reported on 04/10/2022 01/08/22   Eugenie Filler, MD  vedolizumab (ENTYVIO) 300 MG injection INFUSE 300 MG IV every 8 weeks Patient not taking: Reported on 04/10/2022 08/26/21   Gatha Mayer, MD    Physical Exam: Vitals:   04/29/22 1406 04/29/22 1426 04/29/22 1430 04/29/22 1440  BP:  (!) 92/53 (!) 95/49 (!) 98/50  Pulse: 63 63 64 63  Resp: 16 16 17 16   Temp:      TempSrc:      SpO2: 99% 99% 100% 100%  Weight:      Height:        Constitutional: NAD, calm, comfortable.  Dry mucous membranes. Vitals:   04/29/22 1406 04/29/22 1426 04/29/22 1430 04/29/22 1440  BP:  (!) 92/53 (!) 95/49 (!) 98/50  Pulse: 63 63 64 63  Resp: 16 16 17 16   Temp:      TempSrc:      SpO2: 99% 99% 100% 100%  Weight:      Height:       Eyes: PERRL, lids and conjunctivae normal ENMT: Mucous membranes are dry. Posterior pharynx clear of any exudate or lesions.Normal dentition.  Neck: normal, supple, no masses, no thyromegaly Respiratory: clear to auscultation bilaterally, no wheezing, no crackles. Normal  respiratory effort. No accessory muscle use.  Cardiovascular: Regular rate and rhythm, no murmurs / rubs / gallops. No extremity edema. 2+ pedal pulses. No carotid bruits.  Abdomen: Soft, nondistended, nontender to palpation, no hepatosplenomegaly, positive bowel sounds.  Ileostomy with loose greenish stool noted.  , Musculoskeletal: no clubbing / cyanosis. No joint deformity upper and lower extremities. Good ROM, no contractures. Normal muscle tone.  Skin: no rashes, lesions, ulcers. No induration Neurologic: CN 2-12 grossly intact. Sensation intact, DTR normal. Strength 5/5 in all 4.  Psychiatric: Normal judgment and insight. Alert and oriented x 3. Normal mood.    Labs on Admission: I have personally reviewed following labs and imaging studies  CBC: Recent Labs  Lab 04/27/22 1238  WBC 6.2  NEUTROABS 3.2  HGB 10.0*  HCT 33.0*  MCV 80.7  PLT 283    Basic Metabolic Panel: Recent Labs  Lab 04/27/22 1238 04/29/22 1229 04/29/22 1234  NA 135 138  --   K 3.9 3.5  --   CL 106 111  --   CO2 25 23  --   GLUCOSE 88 45*  --   BUN 25* 22  --   CREATININE 0.80 0.84  --   CALCIUM 8.8* 8.7*  --   MG  --   --  2.0  PHOS  --   --  1.1*    GFR: Estimated Creatinine Clearance: 82.5 mL/min (by C-G formula based on SCr of 0.84 mg/dL).  Liver Function Tests: Recent Labs  Lab 04/27/22 1238 04/29/22 1229  AST 26 34  ALT  18 16  ALKPHOS 73 70  BILITOT 0.5 0.7  PROT 6.5 6.3*  ALBUMIN 3.2* 3.2*    Urine analysis:    Component Value Date/Time   COLORURINE YELLOW 04/27/2022 [place] 04/27/2022 1238   LABSPEC 1.028 04/27/2022 1238   PHURINE 5.0 04/27/2022 1238   GLUCOSEU NEGATIVE 04/27/2022 1238   HGBUR NEGATIVE 04/27/2022 1238   HGBUR negative 02/15/2010 1105   BILIRUBINUR NEGATIVE 04/27/2022 1238   BILIRUBINUR neg 10/04/2021 1015   KETONESUR NEGATIVE 04/27/2022 1238   PROTEINUR NEGATIVE 04/27/2022 1238   UROBILINOGEN 1.0 10/04/2021 1015   UROBILINOGEN 0.2  12/19/2011 1101   NITRITE NEGATIVE 04/27/2022 1238   LEUKOCYTESUR NEGATIVE 04/27/2022 1238    Radiological Exams on Admission: CT Angio Chest PE W and/or Wo Contrast  Result Date: 04/29/2022 CLINICAL DATA:  Positive D-dimers. Assess for pulmonary embolus. Acute nonlocalized abdomen pain. EXAM: CT ANGIOGRAPHY CHEST CT ABDOMEN AND PELVIS WITH CONTRAST TECHNIQUE: Multidetector CT imaging of the chest was performed using the standard protocol during bolus administration of intravenous contrast. Multiplanar CT image reconstructions and MIPs were obtained to evaluate the vascular anatomy. Multidetector CT imaging of the abdomen and pelvis was performed using the standard protocol during bolus administration of intravenous contrast. RADIATION DOSE REDUCTION: This exam was performed according to the departmental dose-optimization program which includes automated exposure control, adjustment of the mA and/or kV according to patient size and/or use of iterative reconstruction technique. CONTRAST:  173m OMNIPAQUE IOHEXOL 350 MG/ML SOLN COMPARISON:  CT abdomen and pelvis April 27, 2022, abdomen pelvic CT September 06, 2018 FINDINGS: CTA CHEST FINDINGS Cardiovascular: Satisfactory opacification of the pulmonary arteries to the segmental level. No evidence of pulmonary embolism. Normal heart size. No pericardial effusion. Mediastinum/Nodes: No enlarged mediastinal, hilar, or axillary lymph nodes. Thyroid gland, trachea, and esophagus demonstrate no significant findings. Lungs/Pleura: There is no focal pneumonia or pulmonary edema. Minimal bilateral pleural effusions are noted. Stable lateral right lower lobe subpleural scar is unchanged compared to prior exams. Minimal dependent atelectasis of posterior lung bases are noted. Musculoskeletal: Degenerative joint changes of the spine are noted. Review of the MIP images confirms the above findings. CT ABDOMEN and PELVIS FINDINGS Hepatobiliary: No focal liver abnormality is  seen. No gallstones, gallbladder wall thickening, or biliary dilatation. Pancreas: Unremarkable. No pancreatic ductal dilatation or surrounding inflammatory changes. Spleen: Normal in size without focal abnormality. Adrenals/Urinary Tract: The bilateral adrenal glands are normal. No renal calculi is identified. No focal kidney lesion is noted. No hydronephrosis bilaterally. Dependent bladder calculi identified in a partially decompressed bladder. Stomach/Bowel: A diverting ileostomy is identified in the right lower quadrant. There is evidence of previous partial right colon resection with ileocolonic anastomosis and left hemi colectomy with anastomosis between transverse and sigmoid colon. The surrounding inflammatory change of the right lower quadrant is not significantly changed compared to prior CT of April 27, 2022. the stomach is normal. No free air is identified. Vascular/Lymphatic: Aortic atherosclerosis. No enlarged abdominal or pelvic lymph nodes. Reproductive: Calcifications identified within the prostate gland measuring 5 cm in diameter. Other: Stable postsurgical changes in the anterior abdominal wall compared to prior exam of April 27, 2022. Musculoskeletal: No acute abnormality. Review of the MIP images confirms the above findings. IMPRESSION: 1. No pulmonary embolus. 2. Minimal bilateral pleural effusions with minimal dependent atelectasis of posterior lung bases. 3. No acute abnormality identified in the abdomen and pelvis. 4. Evidence of previous partial right colon resection with ileocolonic anastomosis and left hemi colectomy with anastomosis  between transverse and sigmoid colon. The surrounding inflammatory change of the right lower quadrant is not significantly changed compared to prior CT of April 27, 2022. 5. Aortic atherosclerosis. Aortic Atherosclerosis (ICD10-I70.0). Electronically Signed   By: Abelardo Diesel M.D.   On: 04/29/2022 14:45   DG Chest Portable 1 View  Result Date:  04/29/2022 CLINICAL DATA:  Generalized weakness with chest pain EXAM: PORTABLE CHEST 1 VIEW COMPARISON:  04/09/2022 FINDINGS: Dual-chamber pacer leads from the left in unchanged position, including outward directed right atrial lead. Central line on the right with tip at the upper cavoatrial junction. Artifact from EKG leads. There is no edema, consolidation, effusion, or pneumothorax. IMPRESSION: No acute finding when compared to 04/09/2022 Electronically Signed   By: Jorje Guild M.D.   On: 04/29/2022 12:29    EKG: Independently reviewed.  Normal sinus rhythm with LAFB.  Assessment/Plan Principal Problem:   Hypoglycemia Active Problems:   Hypotension   Crohn's disease (HCC)   Hypophosphatemia   Attention deficit hyperactivity disorder (ADHD)   GERD   Ileostomy in place [place] [facility])   Pacemaker   Protein-calorie malnutrition, severe    #1 hypoglycemia/abdominal pain/nausea and vomiting -Likely secondary to inadvertent TPN bolus given that SNF. -CBG noted at 30 and patient placed on a D10 IV fluids. -Patient with no signs or symptoms of infection.  Currently afebrile.  Normal white count.  CT angiogram chest, abdomen and pelvis negative for any acute abnormalities. -Check a UA with cultures and sensitivities. -Blood cultures ordered and pending. -Continue D10 IV fluids. -Check CBGs every 4 hours for the next 24 hours. -Likely will TPN for the next 24 hours. -Supportive care.  2.  Hypotension -Patient noted to be hypotensive on presentation to the ED, blood pressure seems to be somewhat responding to IV fluids. -Patient with no signs or symptoms of infection.  Afebrile. -Patient with no overt bleeding.  CBC pending. -Continue IV fluids, supportive care. -May consider dose of IV albumin if blood pressure remains low. -Follow.  3.  Hypophosphatemia -K-Phos 30 mmol IV x1. -Repeat labs in the AM.  4.  GERD -PPI.  5.  Status post PPM -Stable.  6.  History of Crohn's disease with  complex past surgical history including exploratory laparotomy with small bowel resection, lysis of adhesion, mesh explantation, diverting loop ileostomy April 2023 -Currently stable. -CT abdomen and pelvis done with no acute changes. -Ileostomy intact.  Stable output. -IV fluids, supportive care. -Outpatient follow-up with primary general surgeon and primary gastroenterologist.  7.  Severe protein calorie malnutrition -On TPN and regular diet orally prior to admission. -Due to accidental bolus of TPN given prior to presentation we will hold TPN for the next 24 hours and resume tomorrow. -Place on a regular diet.  DVT prophylaxis: Lovenox Code Status:   Full Family Communication:  Updated patient.  No family at bedside. Disposition Plan:   Patient is from:  SNF  Anticipated DC to:  SNF  Anticipated DC date:  TBD  Anticipated DC barriers: Clinical improvement, resolution of hypoglycemia, reinitiation of TPN. Consults called:  None Admission status:  Placed in observation/stepdown unit  Severity of Illness: The appropriate patient status for this patient is OBSERVATION. Observation status is judged to be reasonable and necessary in order to provide the required intensity of service to ensure the patient's safety. The patient's presenting symptoms, physical exam findings, and initial radiographic and laboratory data in the context of their medical condition is felt to place them at decreased risk for further  clinical deterioration. Furthermore, it is anticipated that the patient will be medically stable for discharge from the hospital within 2 midnights of admission.     Irine Seal MD [facility]  How to contact the [facility] Attending or Consulting provider [place] or covering provider during after hours [place], for this patient?   Check the care team in [facility] and look for a) attending/consulting [facility] provider listed and b) the [facility] team listed Log into [email or web address] and use [facility]'s universal password to access. If you do not have the password, please contact the hospital operator. Locate the [facility] provider you are looking for under [facility] and page to a number that you can be directly reached. If you still have difficulty reaching the provider, please page the [facility] (Director on Call) for the Hospitalists listed on amion for assistance.  04/29/2022, 3:44 PM

## 2022-04-29 NOTE — ED Triage Notes (Signed)
Pt BIB EMS from [facility], c/o abdominal  pain Staff reported pt set the TPN infusion to open rate. Reportedly self administered 1 liter of TPN at rapid rate. Now c/o generalized body aches and N/V.  BP 138/40 P 88 RR 18 spO2 98%

## 2022-04-29 NOTE — ED Notes (Addendum)
Patient transported to CT 

## 2022-04-30 DIAGNOSIS — N400 Enlarged prostate without lower urinary tract symptoms: Secondary | ICD-10-CM | POA: Diagnosis not present

## 2022-04-30 DIAGNOSIS — I700 Atherosclerosis of aorta: Secondary | ICD-10-CM | POA: Diagnosis present

## 2022-04-30 DIAGNOSIS — N3289 Other specified disorders of bladder: Secondary | ICD-10-CM | POA: Diagnosis present

## 2022-04-30 DIAGNOSIS — K5090 Crohn's disease, unspecified, without complications: Secondary | ICD-10-CM | POA: Diagnosis present

## 2022-04-30 DIAGNOSIS — N210 Calculus in bladder: Secondary | ICD-10-CM | POA: Diagnosis present

## 2022-04-30 DIAGNOSIS — Z888 Allergy status to other drugs, medicaments and biological substances status: Secondary | ICD-10-CM | POA: Diagnosis not present

## 2022-04-30 DIAGNOSIS — R3912 Poor urinary stream: Secondary | ICD-10-CM | POA: Diagnosis present

## 2022-04-30 DIAGNOSIS — R0789 Other chest pain: Secondary | ICD-10-CM | POA: Diagnosis present

## 2022-04-30 DIAGNOSIS — N411 Chronic prostatitis: Secondary | ICD-10-CM | POA: Diagnosis present

## 2022-04-30 DIAGNOSIS — Z6822 Body mass index (BMI) 22.0-22.9, adult: Secondary | ICD-10-CM | POA: Diagnosis not present

## 2022-04-30 DIAGNOSIS — R112 Nausea with vomiting, unspecified: Secondary | ICD-10-CM

## 2022-04-30 DIAGNOSIS — Z79899 Other long term (current) drug therapy: Secondary | ICD-10-CM | POA: Diagnosis not present

## 2022-04-30 DIAGNOSIS — K219 Gastro-esophageal reflux disease without esophagitis: Secondary | ICD-10-CM | POA: Diagnosis present

## 2022-04-30 DIAGNOSIS — Z950 Presence of cardiac pacemaker: Secondary | ICD-10-CM | POA: Diagnosis not present

## 2022-04-30 DIAGNOSIS — E43 Unspecified severe protein-calorie malnutrition: Secondary | ICD-10-CM | POA: Diagnosis present

## 2022-04-30 DIAGNOSIS — K50918 Crohn's disease, unspecified, with other complication: Secondary | ICD-10-CM | POA: Diagnosis not present

## 2022-04-30 DIAGNOSIS — I959 Hypotension, unspecified: Secondary | ICD-10-CM | POA: Diagnosis present

## 2022-04-30 DIAGNOSIS — E162 Hypoglycemia, unspecified: Secondary | ICD-10-CM | POA: Diagnosis present

## 2022-04-30 DIAGNOSIS — F909 Attention-deficit hyperactivity disorder, unspecified type: Secondary | ICD-10-CM | POA: Diagnosis present

## 2022-04-30 DIAGNOSIS — N401 Enlarged prostate with lower urinary tract symptoms: Secondary | ICD-10-CM | POA: Diagnosis present

## 2022-04-30 DIAGNOSIS — Z8249 Family history of ischemic heart disease and other diseases of the circulatory system: Secondary | ICD-10-CM | POA: Diagnosis not present

## 2022-04-30 DIAGNOSIS — R300 Dysuria: Secondary | ICD-10-CM

## 2022-04-30 DIAGNOSIS — Z91013 Allergy to seafood: Secondary | ICD-10-CM | POA: Diagnosis not present

## 2022-04-30 LAB — GLUCOSE, CAPILLARY
Glucose-Capillary: 100 mg/dL — ABNORMAL HIGH (ref 70–99)
Glucose-Capillary: 104 mg/dL — ABNORMAL HIGH (ref 70–99)
Glucose-Capillary: 124 mg/dL — ABNORMAL HIGH (ref 70–99)
Glucose-Capillary: 132 mg/dL — ABNORMAL HIGH (ref 70–99)
Glucose-Capillary: 133 mg/dL — ABNORMAL HIGH (ref 70–99)
Glucose-Capillary: 87 mg/dL (ref 70–99)
Glucose-Capillary: 96 mg/dL (ref 70–99)
Glucose-Capillary: 99 mg/dL (ref 70–99)

## 2022-04-30 LAB — CBC WITH DIFFERENTIAL/PLATELET
Abs Immature Granulocytes: 0.01 10*3/uL (ref 0.00–0.07)
Basophils Absolute: 0 10*3/uL (ref 0.0–0.1)
Basophils Relative: 0 %
Eosinophils Absolute: 0.3 10*3/uL (ref 0.0–0.5)
Eosinophils Relative: 5 %
HCT: 30.2 % — ABNORMAL LOW (ref 39.0–52.0)
Hemoglobin: 9.1 g/dL — ABNORMAL LOW (ref 13.0–17.0)
Immature Granulocytes: 0 %
Lymphocytes Relative: 40 %
Lymphs Abs: 1.9 10*3/uL (ref 0.7–4.0)
MCH: 24.2 pg — ABNORMAL LOW (ref 26.0–34.0)
MCHC: 30.1 g/dL (ref 30.0–36.0)
MCV: 80.3 fL (ref 80.0–100.0)
Monocytes Absolute: 0.5 10*3/uL (ref 0.1–1.0)
Monocytes Relative: 11 %
Neutro Abs: 2.2 10*3/uL (ref 1.7–7.7)
Neutrophils Relative %: 44 %
Platelets: 144 10*3/uL — ABNORMAL LOW (ref 150–400)
RBC: 3.76 MIL/uL — ABNORMAL LOW (ref 4.22–5.81)
RDW: 17.5 % — ABNORMAL HIGH (ref 11.5–15.5)
WBC: 4.9 10*3/uL (ref 4.0–10.5)
nRBC: 0 % (ref 0.0–0.2)

## 2022-04-30 LAB — COMPREHENSIVE METABOLIC PANEL
ALT: 14 U/L (ref 0–44)
AST: 18 U/L (ref 15–41)
Albumin: 2.7 g/dL — ABNORMAL LOW (ref 3.5–5.0)
Alkaline Phosphatase: 57 U/L (ref 38–126)
Anion gap: 5 (ref 5–15)
BUN: 13 mg/dL (ref 8–23)
CO2: 24 mmol/L (ref 22–32)
Calcium: 8.1 mg/dL — ABNORMAL LOW (ref 8.9–10.3)
Chloride: 110 mmol/L (ref 98–111)
Creatinine, Ser: 0.77 mg/dL (ref 0.61–1.24)
GFR, Estimated: >60 mL/min (ref >60)
Glucose, Bld: 102 mg/dL — ABNORMAL HIGH (ref 70–99)
Potassium: 3.4 mmol/L — ABNORMAL LOW (ref 3.5–5.1)
Sodium: 139 mmol/L (ref 135–145)
Total Bilirubin: 0.5 mg/dL (ref 0.3–1.2)
Total Protein: 5.5 g/dL — ABNORMAL LOW (ref 6.5–8.1)

## 2022-04-30 LAB — TRIGLYCERIDES: Triglycerides: 42 mg/dL (ref <150)

## 2022-04-30 LAB — TSH: TSH: 2.417 u[IU]/mL (ref 0.350–4.500)

## 2022-04-30 LAB — URINE CULTURE: Culture: NO GROWTH

## 2022-04-30 LAB — PROTIME-INR
INR: 1.2 (ref 0.8–1.2)
Prothrombin Time: 15.2 seconds (ref 11.4–15.2)

## 2022-04-30 LAB — MAGNESIUM: Magnesium: 1.7 mg/dL (ref 1.7–2.4)

## 2022-04-30 LAB — PHOSPHORUS: Phosphorus: 4.4 mg/dL (ref 2.5–4.6)

## 2022-04-30 MED ORDER — TRACE MINERALS CU-MN-SE-ZN 300-55-60-3000 MCG/ML IV SOLN
INTRAVENOUS | Status: AC
  Filled 2022-04-30 (×2): qty 676

## 2022-04-30 MED ORDER — PROCHLORPERAZINE EDISYLATE 10 MG/2ML IJ SOLN
5.0000 mg | Freq: Once | INTRAMUSCULAR | Status: AC | PRN
  Administered 2022-04-30: 5 mg via INTRAVENOUS
  Filled 2022-04-30: qty 2

## 2022-04-30 MED ORDER — SULFAMETHOXAZOLE-TRIMETHOPRIM 800-160 MG PO TABS
1.0000 | ORAL_TABLET | Freq: Two times a day (BID) | ORAL | Status: DC
  Administered 2022-04-30 – 2022-05-03 (×7): 1 via ORAL
  Filled 2022-04-30 (×7): qty 1

## 2022-04-30 MED ORDER — TAMSULOSIN HCL 0.4 MG PO CAPS
0.4000 mg | ORAL_CAPSULE | Freq: Every day | ORAL | Status: DC
  Administered 2022-04-30 – 2022-05-03 (×4): 0.4 mg via ORAL
  Filled 2022-04-30 (×4): qty 1

## 2022-04-30 MED ORDER — MAGNESIUM SULFATE 2 GM/50ML IV SOLN
2.0000 g | Freq: Once | INTRAVENOUS | Status: AC
  Administered 2022-04-30: 2 g via INTRAVENOUS
  Filled 2022-04-30: qty 50

## 2022-04-30 MED ORDER — ALBUMIN HUMAN 25 % IV SOLN
25.0000 g | Freq: Once | INTRAVENOUS | Status: AC
  Administered 2022-04-30: 25 g via INTRAVENOUS
  Filled 2022-04-30: qty 100

## 2022-04-30 MED ORDER — SODIUM CHLORIDE 0.9 % IV SOLN: INTRAVENOUS | Status: DC | PRN

## 2022-04-30 MED ORDER — INSULIN ASPART 100 UNIT/ML IJ SOLN
0.0000 [IU] | Freq: Four times a day (QID) | INTRAMUSCULAR | Status: DC
  Administered 2022-04-30 (×2): 1 [IU] via SUBCUTANEOUS
  Administered 2022-05-02: 2 [IU] via SUBCUTANEOUS
  Administered 2022-05-02: 1 [IU] via SUBCUTANEOUS

## 2022-04-30 MED ORDER — POTASSIUM CHLORIDE CRYS ER 20 MEQ PO TBCR
20.0000 meq | EXTENDED_RELEASE_TABLET | Freq: Once | ORAL | Status: AC
  Administered 2022-04-30: 20 meq via ORAL
  Filled 2022-04-30: qty 1

## 2022-04-30 NOTE — Evaluation (Signed)
Occupational Therapy Evaluation Patient Details Name: [name] MRN: [id number] DOB: [birth year] Today's Date: 04/30/2022   History of Present Illness Patient is a 71 year old male who presented from short term rehab with abdomina pain with nausea and vomitting s/p receiving 1L of TPN over 1 hour by mistake. patient was found to have hypoglycemia, and hypotension in ED resulting in admission to ICU. 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 Patient reported living at home alone prior level with  patient presenting to the hospital from SNF short term rehab stay.patient endorses not being at functional level to transition back home alone at this time.  Patient was noted to have decreased functional activity tolerance, decreased endurance, decreased standing balance, decreased safety awareness, and decreased knowledge of AD/AE impacting participation in ADLs. Patient would continue to benefit from skilled OT services at this time while admitted and after d/c to address noted deficits in order to improve overall safety and independence in ADLs.       Recommendations for follow up therapy are one component of a multi-disciplinary discharge planning process, led by the attending physician.  Recommendations may be updated based on patient status, additional functional criteria and insurance authorization.   Follow Up Recommendations  Skilled nursing-short term rehab (<3 hours/day)    Assistance Recommended at Discharge Frequent or constant Supervision/Assistance  Patient can return home with the following A little help with walking and/or transfers;A little help with bathing/dressing/bathroom;Direct supervision/assist for financial  management;Assistance with cooking/housework;Assist for transportation;Help with stairs or ramp for entrance;Direct supervision/assist for medications management    Functional Status Assessment  Patient has had a recent decline in their functional status and demonstrates the ability to make significant improvements in function in a reasonable and predictable amount of time.  Equipment Recommendations  None recommended by OT    Recommendations for Other Services       Precautions / Restrictions Precautions Precaution Comments: monitor BP Restrictions Weight Bearing Restrictions: No      Mobility Bed Mobility Overal bed mobility: Needs Assistance Bed Mobility: Supine to Sit, Sit to Supine     Supine to sit: Supervision Sit to supine: Supervision   General bed mobility comments: Increased time with assist to manage lines    Transfers Overall transfer level: Needs assistance Equipment used: Rolling walker (2 wheels) Transfers: Sit to/from Stand Sit to Stand: Min guard, Supervision           General transfer comment: Steady assist only      Balance Overall balance assessment: Needs assistance Sitting-balance support: No upper extremity supported, Feet supported Sitting balance-Leahy Scale: Good     Standing balance support: No upper extremity supported Standing balance-Leahy Scale: Fair                             ADL either performed or assessed with clinical judgement   ADL Overall ADL's : Needs assistance/impaired   Eating/Feeding Details (indicate cue type and reason): uses TPN at this time. Grooming: Set up;Sitting;Brushing hair Grooming Details (indicate cue type and reason): EOB. patient decilned to wash up brush teeth and use bathroom at this time. Upper Body Bathing: Set up;Sitting   Lower Body Bathing: Min guard;Sit to/from stand;Sitting/lateral leans  Upper Body Dressing : Set up;Sitting   Lower Body Dressing: Min guard;Sit to/from  stand;Sitting/lateral leans   Toilet Transfer: Supervision/safety;Ambulation;Rolling walker (2 wheels) Toilet Transfer Details (indicate cue type and reason): transfer on and off bed with mobility in hallway as well. BLE noted to be shakiness but able to maintain balance with RW Toileting- Clothing Manipulation and Hygiene: Sit to/from stand;Minimal assistance               Vision Baseline Vision/History: 1 Wears glasses Patient Visual Report: No change from baseline       Perception     Praxis      Pertinent Vitals/Pain Pain Assessment Pain Assessment: No/denies pain     Hand Dominance     Extremity/Trunk Assessment Upper Extremity Assessment Upper Extremity Assessment: Overall WFL for tasks assessed   Lower Extremity Assessment Lower Extremity Assessment: Generalized weakness   Cervical / Trunk Assessment Cervical / Trunk Assessment: Normal   Communication Communication Communication: No difficulties   Cognition Arousal/Alertness: Awake/alert Behavior During Therapy: WFL for tasks assessed/performed Overall Cognitive Status: Within Functional Limits for tasks assessed                                       General Comments  BP was 155/69mhg with nurse clearing patient to participate in session. BP is beign taken on R calf.    Exercises     Shoulder Instructions      Home Living Family/patient expects to be discharged to:: Skilled nursing facility                                 Additional Comments: patient reported living at home alone but plans to return to SNF for continued short term placement pending surgical interventions.      Prior Functioning/Environment Prior Level of Function : Independent/Modified Independent             Mobility Comments: Pt states has RW but has not been using it recently          OT Problem List: Impaired balance (sitting and/or standing);Decreased safety awareness;Decreased  activity tolerance      OT Treatment/Interventions: Self-care/ADL training;Therapeutic exercise;Neuromuscular education;Energy conservation;DME and/or AE instruction;Therapeutic activities;Balance training;Patient/family education    OT Goals(Current goals can be found in the care plan section) Acute Rehab OT Goals Patient Stated Goal: to get back to SNF OT Goal Formulation: With patient Time For Goal Achievement: 05/14/22 Potential to Achieve Goals: Fair  OT Frequency: Min 1X/week    Co-evaluation PT/OT/SLP Co-Evaluation/Treatment: Yes Reason for Co-Treatment: For patient/therapist safety PT goals addressed during session: Mobility/safety with mobility OT goals addressed during session: ADL's and self-care      AM-PAC OT "6 Clicks" Daily Activity     Outcome Measure Help from another person eating meals?: A Little Help from another person taking care of personal grooming?: A Little Help from another person toileting, which includes using toliet, bedpan, or urinal?: A Lot Help from another person bathing (including washing, rinsing, drying)?: A Little Help from another person to put on and taking off regular upper body clothing?: A Little Help from another person to put on and taking off regular lower body clothing?: A Lot 6 Click Score: 16   End of Session Equipment Utilized During Treatment: Gait belt;Rolling walker (2 wheels) Nurse Communication: Mobility status  Activity Tolerance:  Patient tolerated treatment well Patient left: in bed;with call bell/phone within reach;with bed alarm set  OT Visit Diagnosis: Unsteadiness on feet (R26.81);Other abnormalities of gait and mobility (R26.89);Muscle weakness (generalized) (M62.81)                Time: 1140-1200 OT Time Calculation (min): 20 min Charges:  OT General Charges $OT Visit: 1 Visit OT Evaluation $OT Eval Low Complexity: 1 Low  Neziah Braley OTR/L, MS Acute Rehabilitation Department Office# [phone]   Marcellina Millin 04/30/2022, 1:38 PM

## 2022-04-30 NOTE — Evaluation (Signed)
Physical Therapy Evaluation Patient Details Name: [name] MRN: [id number] DOB: 1951-06-10 Today's Date: 04/30/2022  History of Present Illness  Patient is a 71 year old male who presented from short term rehab with abdomina pain with nausea and vomitting s/p receiving 1L of TPN over 1 hour by mistake. patient was found to have hypoglycemia, and hypotension in ED resulting in admission to ICU. 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  Pt reports planned return to prior SNF to continue rehabilitation in preparation for future surgery.     Recommendations for follow up therapy are one component of a multi-disciplinary discharge planning process, led by the attending physician.  Recommendations may be updated based on patient status, additional functional criteria and insurance authorization.  Follow Up Recommendations Skilled nursing-short term rehab (<3 hours/day) Can patient physically be transported by private vehicle: Yes    Assistance Recommended at Discharge Set up Supervision/Assistance  Patient can return home with the following  A little help with walking and/or transfers;A little help with bathing/dressing/bathroom;Assist for transportation;Help with stairs or ramp for entrance    Equipment Recommendations None recommended by PT  Recommendations for Other Services       Functional Status Assessment Patient has had a recent decline in their functional status and demonstrates the ability to make significant improvements in function in a reasonable and predictable amount of time.     Precautions / Restrictions  Precautions Precaution Comments: monitor BP Restrictions Weight Bearing Restrictions: No      Mobility  Bed Mobility Overal bed mobility: Needs Assistance Bed Mobility: Supine to Sit, Sit to Supine     Supine to sit: Supervision Sit to supine: Supervision   General bed mobility comments: Increased time with assist to manage lines    Transfers Overall transfer level: Needs assistance Equipment used: Rolling walker (2 wheels) Transfers: Sit to/from Stand Sit to Stand: Min guard, Supervision           General transfer comment: Steady assist only    Ambulation/Gait Ambulation/Gait assistance: Min guard Gait Distance (Feet): 180 Feet Assistive device: Rolling walker (2 wheels) Gait Pattern/deviations: Step-through pattern, Decreased step length - right, Decreased step length - left, Shuffle, Trunk flexed       General Gait Details: min cues for posture and position from [employer]            Wheelchair Mobility    Modified Rankin (Stroke Patients Only)       Balance Overall balance assessment: Needs assistance Sitting-balance support: No upper extremity supported, Feet supported Sitting balance-Leahy Scale: Good     Standing balance support: No upper extremity supported Standing balance-Leahy Scale: Fair                               Pertinent Vitals/Pain Pain Assessment Pain Assessment: No/denies pain    Home Living Family/patient expects to be discharged to:: Skilled nursing facility                   Additional Comments: patient reported living at home alone but plans to return to SNF for continued short term placement pending surgical interventions.    Prior  Function Prior Level of Function : Independent/Modified Independent             Mobility Comments: Pt states has RW but has not been using it recently       Hand Dominance        Extremity/Trunk Assessment   Upper Extremity Assessment Upper Extremity  Assessment: Overall WFL for tasks assessed    Lower Extremity Assessment Lower Extremity Assessment: Generalized weakness    Cervical / Trunk Assessment Cervical / Trunk Assessment: Normal  Communication   Communication: No difficulties  Cognition Arousal/Alertness: Awake/alert Behavior During Therapy: WFL for tasks assessed/performed Overall Cognitive Status: Within Functional Limits for tasks assessed                                          General Comments General comments (skin integrity, edema, etc.): BP was 155/66mhg with nurse clearing patient to participate in session. BP is beign taken on R calf.    Exercises     Assessment/Plan    PT Assessment Patient needs continued PT services  PT Problem List Decreased strength;Decreased activity tolerance;Decreased balance;Decreased knowledge of use of DME;Decreased mobility       PT Treatment Interventions DME instruction;Gait training;Functional mobility training;Therapeutic activities;Therapeutic exercise;Patient/family education    PT Goals (Current goals can be found in the Care Plan section)  Acute Rehab PT Goals Patient Stated Goal: Regain IND, gain enough weight for surgery PT Goal Formulation: With patient Time For Goal Achievement: 05/14/22 Potential to Achieve Goals: Good    Frequency Min 3X/week     Co-evaluation PT/OT/SLP Co-Evaluation/Treatment: Yes Reason for Co-Treatment: For patient/therapist safety PT goals addressed during session: Mobility/safety with mobility OT goals addressed during session: ADL's and self-care       AM-PAC PT "6 Clicks" Mobility  Outcome Measure Help needed turning from your back to your side while in a flat bed without using bedrails?: None Help needed moving from lying on your back to sitting on the side of a flat bed without using bedrails?: A Little Help needed moving to and from a bed to a chair (including a wheelchair)?: A Little Help needed  standing up from a chair using your arms (e.g., wheelchair or bedside chair)?: A Little Help needed to walk in hospital room?: A Little Help needed climbing 3-5 steps with a railing? : A Lot 6 Click Score: 18    End of Session   Activity Tolerance: Patient tolerated treatment well Patient left: in bed;with call bell/phone within reach;with chair alarm set Nurse Communication: Mobility status PT Visit Diagnosis: Difficulty in walking, not elsewhere classified (R26.2)    Time: 10814-4818PT Time Calculation (min) (ACUTE ONLY): 18 min   Charges:   PT Evaluation $PT Eval Low Complexity: 1 Low          HMerriamPager [phone] [phone]  [place] 04/30/2022, 1:26 PM

## 2022-04-30 NOTE — Progress Notes (Addendum)
PHARMACY - TOTAL PARENTERAL NUTRITION CONSULT NOTE   Indication: Crohn's disease and complex past surgical history including ex lap with small bowel resection, lysis of adhesion, mesh explantation, and diverting loop ileostomy in April 2023, on TPN prior to admission  Patient Measurements: Height: 5' 10"  (177.8 cm) Weight: 72.9 kg (160 lb 11.5 oz) IBW/kg (Calculated) : 73 TPN AdjBW (KG): 70.3 Body mass index is 23.06 kg/m.  Assessment: Patient presented with hypoglycemia/abdominal pain/nausea and vomiting. Staff from SNF reported pt set the TPN infusion to open rate causing current issues that presented to ER with. To continue TPN as inpatient, will get formula as close to outpatient formula as possible  Discharge from [facility] 04/19/22:  [facility]. Their contact information is as follows: Name: [name], PharmD, ph: [phone], email: [email or web address].   Previous RD: Estimated Nutrient Requirements at [facility] Protein: 1.5 - 1.7 g/kg/day (98 - 104 g/day) Total kcal: 30 - 35 kcal/kg/day (1830 - 1983 kcal/day)   TPN Formulation   [A 6.6%, D 20%, F 3.1%] @ goal rate, 65 mL/hr (TV: 1560 mL/day) provides:  Protein 1.7 g/kg/day 103 g/day  Total kcal 32 kcal/kg/day 1971 kcal/day  NPC: N ratio 94   TPN Goal rate: 65 mL/hr (TV: 1560 mL/day)   Glucose / Insulin: patient currently on D10W at 140m/hr before TPN starts this evening at 1800, CBGs at goal < 150 Electrolytes: WNL except K slightly low at 3.4 - Md already ordered PO repletion Renal: SCr 0.77 Hepatic: LFTs WNL, tbili 0.5 Intake / Output; MIVF: D10W at 1026mhr GI Imaging: GI Surgeries / Procedures:   Central access: had prior to admission - PICC TPN start date: started PTA  Nutritional Goals: Goal TPN rate is 65 mL/hr (provides 101 g of protein and 1775 kcals per day)  RD Assessment:    Current Nutrition:  Eating regular diet  Plan:  Restart TPN at goal rate 65 mL/hr at  1800 Electrolytes in TPN: Na 50106mL, K 86m68m, Ca 5mEq42m Mg 5mEq/60mand Phos 15mmol65mCl:Ac 1:1 Add standard MVI and trace elements to TPN Start Sensitive q6h SSI and adjust as needed  Stop D10W at 1800 when TPN starts, further IVFs per Md Monitor TPN labs on Mon/Thurs and prn  Richie Bonanno, Kara Mead2023,9:38 AM

## 2022-04-30 NOTE — Consult Note (Signed)
[facility] Nurse ostomy consult note Stoma type/location: RUQ ileostomy. Patient known to our service from his last admission in June, 2023. Surgery performed at [facility]. He was anticipating reanastomosis surgery in August, I do not know why that was not done. Stomal assessment/size: At last visit, less than 1-inch round, red, budded and moist Peristomal assessment: Not seen today Treatment options for stomal/peristomal skin: skin barrier ring Output: liquid Ostomy pouching: 2pc. convex supplies brought from home Education provided: None, patient has an established ostomy.  Enrolled patient in [place] program:No. Patient is established with a supplier and has supplies provided at [facility] where he resides.   Patient wears a 2-piece convex pouching system and as his supplies with him. We do not carry the 2-piece convex system and he has leaked in a 2-piece flat system previously, which is the one we carry. We have a 1-piece convex pouching system and I have provided those supply orders in the event his  stay is longer than his supplies will last.   I have communicated with his Bedside ICU nurse A. Duanne Limerick today via [employer] and she will perform pouch change today with patient's supplies.  [facility] nursing team will not follow, but will remain available to this patient, the nursing and medical teams.  Please re-consult if needed.  Thank you for inviting [place] to participate in this patient's Plan of Care.  Maudie Flakes, MSN, RN, CNS, [place], Serita Grammes, [employer], [employer] phone:  [phone]

## 2022-04-30 NOTE — Progress Notes (Signed)
PROGRESS NOTE    [name]  FBP:[id number] DOB: [birth year] DOA: 04/29/2022 PCP: [name], MD    Chief Complaint  Patient presents with   Abdominal Pain    Brief Narrative:   [name] is a 71 y.o. male with medical history significant of Crohn's disease and complex past surgical history including exploratory laparotomy with small bowel resection, lysis of adhesions, mesh explantation, diverting loop ileostomy April 2023, recently readmitted to the colorectal service at [facility] on April 09, 2022 with failure to thrive and stoma retraction.  Patient started on TPN during that hospitalization in addition to oral intake and discharged to skilled nursing facility.  Patient presenting back from facility with abdominal pain nausea vomiting some shortness of breath and chest tightness which started soon after he received a liter of TPN over 1 hour bolus by mistake.  Patient noted to supposedly be on TPN at 60 cc an hour.  Patient did state that after receiving the fluid he had diffuse abdominal pain, diffuse aches all over, nausea, dry heaves, vomiting, some shortness of breath, chest tightness which had improved at the time of my evaluation.  Patient denied any fevers, no diarrhea or constipation, no melena, no lip or tongue swelling, no difficulty swallowing.  Patient does endorse some lightheadedness, dizziness, dysuria.  Patient denies any penile discharge.  Patient does state ostomy output has been stable.  Chronic abdominal pain also stable.   ED Course: Patient seen in the ED, comprehensive metabolic profile done with a glucose of 45, creatinine of 0.84, potassium of 3.5, calcium of 8.7, albumin of 3.2, protein of 6.3 otherwise within normal limits.  Phosphorus level was 1.1, magnesium at 2.0.  Triglycerides at 84.  High-sensitivity troponin negative.  Lactic acid level at 1.8.  CBC pending.  Urinalysis pending.  Chest x-ray with no acute cardiopulmonary abnormalities.  CT  angiogram chest done negative for PE.  Minimal bilateral pleural effusions with minimal dependent atelectasis of the posterior lung bases.  CT abdomen and pelvis with no acute abnormalities identified, evidence of previous partial right colon resection with ileocolonic anastomosis and left hemicolectomy with anastomosis between transverse and sigmoid colon.  Surrounding inflammatory change of right lower quadrant is not significantly changed compared to prior CT of April 27, 2022.  Aortic atherosclerosis.  Patient noted to have hypotensive/soft blood pressures in the ED with systolics in the 82U with some response to IV fluids.  Patient with no wheezing and no significant shortness of breath and as such not given any IV steroids.  CBG initially noted at 30 and patient placed on a D10 infusion.   Assessment & Plan:   Principal Problem:   Hypoglycemia Active Problems:   Hypotension   Crohn's disease (HCC)   Hypophosphatemia   Attention deficit hyperactivity disorder (ADHD)   GERD   Ileostomy in place [facility])   Pacemaker   Protein-calorie malnutrition, severe   Nausea and vomiting   Dysuria  #1 hypoglycemia/abdominal pain/nausea and vomiting -Likely secondary to inadvertent TPN bolus given that SNF. -CBG noted at 30 and patient placed on a D10 IV fluids. -Patient with no signs or symptoms of infection.  Currently afebrile.  Normal white count.  CT angiogram chest, abdomen and pelvis negative for any acute abnormalities. -Urinalysis nitrite negative, leukocytes negative.   -Blood cultures pending.  -Continue D10 IV fluids until TPN is started this evening and then discontinue D10. -Change CBGs to before meals and at bedtime. -TPN to be resumed this evening  per pharmacy. -IV antiemetics, supportive care.   2.  Hypotension -Patient noted to be hypotensive on presentation to the ED, blood pressure seems to be somewhat responding to IV fluids. -Patient with no signs or symptoms of infection.   -Blood pressure somewhat soft.   Afebrile. -Patient with no overt bleeding.  -Hemoglobin stable at 9.1. -Continue IV fluids, supportive care. -We will give a dose of IV albumin x1 today. -Follow.   3.  Hypophosphatemia -Status post K-Phos 30 mmol IV x1. -Phosphorus at 4.4 today from 1.1 on admission. -Repeat labs in the AM.   4.  GERD -Continue PPI.   5.  Status post PPM -Stable.   6.  History of Crohn's disease with complex past surgical history including exploratory laparotomy with small bowel resection, lysis of adhesion, mesh explantation, diverting loop ileostomy April 2023 -Currently stable. -CT abdomen and pelvis done with no acute changes. -Ileostomy intact.  Stable output. -IV fluids, supportive care. -Outpatient follow-up with primary general surgeon and primary gastroenterologist.   7.  Severe protein calorie malnutrition -On TPN and regular diet orally prior to admission. -Due to accidental bolus of TPN given prior to presentation TPN held on admission and will be resumed this evening at 6 PM.   -Continue current regular diet orally.   -Supportive care.  8.  Dysuria/urinary pain -Patient with complaints of dysuria/urinary pain ongoing for the past 2 to 3 weeks, states he has outpatient appointment with urology however is requesting that he be evaluated by urology during this hospitalization. -Urinalysis which was done nonconcerning for UTI. -Consult with urology for further evaluation and management per patient request.    DVT prophylaxis: Lovenox Code Status: Full Family Communication: Updated patient.  No family at bedside. Disposition: Back to SNF once medically stable, electrolytes corrected, resolution of hypoglycemia.  Status is: Observation The patient remains OBS appropriate and will d/c before 2 midnights.   Consultants:  None  Procedures:  CT angiogram chest 04/29/2022 CT abdomen and pelvis 04/29/2022 Chest x-ray  04/29/2022   Antimicrobials: None   Subjective: Laying in bed.  Overall feeling better.  Tolerating diet.  Denies any further nausea or vomiting.  Abdominal pain improved.  Still complaining of urinary pain, requesting to be seen by urologist as he states was told to call urology if urinary pain worsened and has been ongoing for about 2 to 3 weeks.  Currently receiving IV albumin.  Objective: Vitals:   04/30/22 0855 04/30/22 0900 04/30/22 0950 04/30/22 1000  BP:  (!) 111/54  (!) 151/48  Pulse:  65  65  Resp:  14  14  Temp: 97.7 F (36.5 C)     TempSrc: Oral     SpO2:  100%  100%  Weight:   72.8 kg   Height:        Intake/Output Summary (Last 24 hours) at 04/30/2022 1016 Last data filed at 04/30/2022 0700 Gross per 24 hour  Intake 6169.98 ml  Output 2755 ml  Net 3414.98 ml   Filed Weights   04/29/22 1118 04/30/22 0500 04/30/22 0950  Weight: 70.3 kg 72.9 kg 72.8 kg    Examination:  General exam: Appears calm and comfortable  Respiratory system: Clear to auscultation. Respiratory effort normal. Cardiovascular system: S1 & S2 heard, RRR. No JVD, murmurs, rubs, gallops or clicks. No pedal edema. Gastrointestinal system: Abdomen is nondistended, soft and nontender. No organomegaly or masses felt. Normal bowel sounds heard.  PEG tube intact. Central nervous system: Alert and oriented. No focal neurological  deficits. Extremities: Symmetric 5 x 5 power. Skin: No rashes, lesions or ulcers Psychiatry: Judgement and insight appear normal. Mood & affect appropriate.     Data Reviewed: I have personally reviewed following labs and imaging studies  CBC: Recent Labs  Lab 04/27/22 1238 04/29/22 1619 04/30/22 0303  WBC 6.2 6.2 4.9  NEUTROABS 3.2 3.9 2.2  HGB 10.0* 9.2* 9.1*  HCT 33.0* 30.0* 30.2*  MCV 80.7 80.2 80.3  PLT 175 154 144*    Basic Metabolic Panel: Recent Labs  Lab 04/27/22 1238 04/29/22 1229 04/29/22 1234 04/30/22 0303  NA 135 138  --  139  K 3.9 3.5   --  3.4*  CL 106 111  --  110  CO2 25 23  --  24  GLUCOSE 88 45*  --  102*  BUN 25* 22  --  13  CREATININE 0.80 0.84  --  0.77  CALCIUM 8.8* 8.7*  --  8.1*  MG  --   --  2.0 1.7  PHOS  --   --  1.1* 4.4    GFR: Estimated Creatinine Clearance: 89.7 mL/min (by C-G formula based on SCr of 0.77 mg/dL).  Liver Function Tests: Recent Labs  Lab 04/27/22 1238 04/29/22 1229 04/30/22 0303  AST 26 34 18  ALT 18 16 14   ALKPHOS 73 70 57  BILITOT 0.5 0.7 0.5  PROT 6.5 6.3* 5.5*  ALBUMIN 3.2* 3.2* 2.7*    CBG: Recent Labs  Lab 04/29/22 1738 04/29/22 2058 04/29/22 2339 04/30/22 0320 04/30/22 0821  GLUCAP 87 104* 133* 96 100*     Recent Results (from the past 240 hour(s))  MRSA Next Gen by PCR, Nasal     Status: Abnormal   Collection Time: 04/29/22  5:16 PM   Specimen: Nasal Mucosa; Nasal Swab  Result Value Ref Range Status   MRSA by PCR Next Gen DETECTED (A) NOT DETECTED Final    Comment: CRITICAL RESULT CALLED TO, READ BACK BY AND VERIFIED WITH: DOSTER,S AT 2117 ON 04/29/22 BY LUZOLOP (NOTE) The GeneXpert MRSA Assay (FDA approved for NASAL specimens only), is one component of a comprehensive MRSA colonization surveillance program. It is not intended to diagnose MRSA infection nor to guide or monitor treatment for MRSA infections. Test performance is not FDA approved in patients less than 85 years old. Performed at [facility], [place] [address], [place], [place] [address]          Radiology Studies: CT Angio Chest PE W and/or Wo Contrast  Result Date: 04/29/2022 CLINICAL DATA:  Positive D-dimers. Assess for pulmonary embolus. Acute nonlocalized abdomen pain. EXAM: CT ANGIOGRAPHY CHEST CT ABDOMEN AND PELVIS WITH CONTRAST TECHNIQUE: Multidetector CT imaging of the chest was performed using the standard protocol during bolus administration of intravenous contrast. Multiplanar CT image reconstructions and MIPs were obtained to evaluate the vascular  anatomy. Multidetector CT imaging of the abdomen and pelvis was performed using the standard protocol during bolus administration of intravenous contrast. RADIATION DOSE REDUCTION: This exam was performed according to the departmental dose-optimization program which includes automated exposure control, adjustment of the mA and/or kV according to patient size and/or use of iterative reconstruction technique. CONTRAST:  126m OMNIPAQUE IOHEXOL 350 MG/ML SOLN COMPARISON:  CT abdomen and pelvis April 27, 2022, abdomen pelvic CT September 06, 2018 FINDINGS: CTA CHEST FINDINGS Cardiovascular: Satisfactory opacification of the pulmonary arteries to the segmental level. No evidence of pulmonary embolism. Normal heart size. No pericardial effusion. Mediastinum/Nodes: No enlarged mediastinal, hilar, or  axillary lymph nodes. Thyroid gland, trachea, and esophagus demonstrate no significant findings. Lungs/Pleura: There is no focal pneumonia or pulmonary edema. Minimal bilateral pleural effusions are noted. Stable lateral right lower lobe subpleural scar is unchanged compared to prior exams. Minimal dependent atelectasis of posterior lung bases are noted. Musculoskeletal: Degenerative joint changes of the spine are noted. Review of the MIP images confirms the above findings. CT ABDOMEN and PELVIS FINDINGS Hepatobiliary: No focal liver abnormality is seen. No gallstones, gallbladder wall thickening, or biliary dilatation. Pancreas: Unremarkable. No pancreatic ductal dilatation or surrounding inflammatory changes. Spleen: Normal in size without focal abnormality. Adrenals/Urinary Tract: The bilateral adrenal glands are normal. No renal calculi is identified. No focal kidney lesion is noted. No hydronephrosis bilaterally. Dependent bladder calculi identified in a partially decompressed bladder. Stomach/Bowel: A diverting ileostomy is identified in the right lower quadrant. There is evidence of previous partial right colon  resection with ileocolonic anastomosis and left hemi colectomy with anastomosis between transverse and sigmoid colon. The surrounding inflammatory change of the right lower quadrant is not significantly changed compared to prior CT of April 27, 2022. the stomach is normal. No free air is identified. Vascular/Lymphatic: Aortic atherosclerosis. No enlarged abdominal or pelvic lymph nodes. Reproductive: Calcifications identified within the prostate gland measuring 5 cm in diameter. Other: Stable postsurgical changes in the anterior abdominal wall compared to prior exam of April 27, 2022. Musculoskeletal: No acute abnormality. Review of the MIP images confirms the above findings. IMPRESSION: 1. No pulmonary embolus. 2. Minimal bilateral pleural effusions with minimal dependent atelectasis of posterior lung bases. 3. No acute abnormality identified in the abdomen and pelvis. 4. Evidence of previous partial right colon resection with ileocolonic anastomosis and left hemi colectomy with anastomosis between transverse and sigmoid colon. The surrounding inflammatory change of the right lower quadrant is not significantly changed compared to prior CT of April 27, 2022. 5. Aortic atherosclerosis. Aortic Atherosclerosis (ICD10-I70.0). Electronically Signed   By: Abelardo Diesel M.D.   On: 04/29/2022 14:45   CT ABDOMEN PELVIS W CONTRAST  Result Date: 04/29/2022 CLINICAL DATA:  Positive D-dimers. Assess for pulmonary embolus. Acute nonlocalized abdomen pain. EXAM: CT ANGIOGRAPHY CHEST CT ABDOMEN AND PELVIS WITH CONTRAST TECHNIQUE: Multidetector CT imaging of the chest was performed using the standard protocol during bolus administration of intravenous contrast. Multiplanar CT image reconstructions and MIPs were obtained to evaluate the vascular anatomy. Multidetector CT imaging of the abdomen and pelvis was performed using the standard protocol during bolus administration of intravenous contrast. RADIATION DOSE  REDUCTION: This exam was performed according to the departmental dose-optimization program which includes automated exposure control, adjustment of the mA and/or kV according to patient size and/or use of iterative reconstruction technique. CONTRAST:  170m OMNIPAQUE IOHEXOL 350 MG/ML SOLN COMPARISON:  CT abdomen and pelvis April 27, 2022, abdomen pelvic CT September 06, 2018 FINDINGS: CTA CHEST FINDINGS Cardiovascular: Satisfactory opacification of the pulmonary arteries to the segmental level. No evidence of pulmonary embolism. Normal heart size. No pericardial effusion. Mediastinum/Nodes: No enlarged mediastinal, hilar, or axillary lymph nodes. Thyroid gland, trachea, and esophagus demonstrate no significant findings. Lungs/Pleura: There is no focal pneumonia or pulmonary edema. Minimal bilateral pleural effusions are noted. Stable lateral right lower lobe subpleural scar is unchanged compared to prior exams. Minimal dependent atelectasis of posterior lung bases are noted. Musculoskeletal: Degenerative joint changes of the spine are noted. Review of the MIP images confirms the above findings. CT ABDOMEN and PELVIS FINDINGS Hepatobiliary: No focal liver abnormality is seen. No gallstones,  gallbladder wall thickening, or biliary dilatation. Pancreas: Unremarkable. No pancreatic ductal dilatation or surrounding inflammatory changes. Spleen: Normal in size without focal abnormality. Adrenals/Urinary Tract: The bilateral adrenal glands are normal. No renal calculi is identified. No focal kidney lesion is noted. No hydronephrosis bilaterally. Dependent bladder calculi identified in a partially decompressed bladder. Stomach/Bowel: A diverting ileostomy is identified in the right lower quadrant. There is evidence of previous partial right colon resection with ileocolonic anastomosis and left hemi colectomy with anastomosis between transverse and sigmoid colon. The surrounding inflammatory change of the right lower  quadrant is not significantly changed compared to prior CT of April 27, 2022. the stomach is normal. No free air is identified. Vascular/Lymphatic: Aortic atherosclerosis. No enlarged abdominal or pelvic lymph nodes. Reproductive: Calcifications identified within the prostate gland measuring 5 cm in diameter. Other: Stable postsurgical changes in the anterior abdominal wall compared to prior exam of April 27, 2022. Musculoskeletal: No acute abnormality. Review of the MIP images confirms the above findings. IMPRESSION: 1. No pulmonary embolus. 2. Minimal bilateral pleural effusions with minimal dependent atelectasis of posterior lung bases. 3. No acute abnormality identified in the abdomen and pelvis. 4. Evidence of previous partial right colon resection with ileocolonic anastomosis and left hemi colectomy with anastomosis between transverse and sigmoid colon. The surrounding inflammatory change of the right lower quadrant is not significantly changed compared to prior CT of April 27, 2022. 5. Aortic atherosclerosis. Aortic Atherosclerosis (ICD10-I70.0). Electronically Signed   By: Abelardo Diesel M.D.   On: 04/29/2022 14:45   DG Chest Portable 1 View  Result Date: 04/29/2022 CLINICAL DATA:  Generalized weakness with chest pain EXAM: PORTABLE CHEST 1 VIEW COMPARISON:  04/09/2022 FINDINGS: Dual-chamber pacer leads from the left in unchanged position, including outward directed right atrial lead. Central line on the right with tip at the upper cavoatrial junction. Artifact from EKG leads. There is no edema, consolidation, effusion, or pneumothorax. IMPRESSION: No acute finding when compared to 04/09/2022 Electronically Signed   By: Jorje Guild M.D.   On: 04/29/2022 12:29        Scheduled Meds:  busPIRone  5 mg Oral TID   Chlorhexidine Gluconate Cloth  6 each Topical Daily   enoxaparin (LOVENOX) injection  40 mg Subcutaneous QHS   insulin aspart  0-9 Units Subcutaneous Q6H   melatonin  3 mg Oral  QHS   mupirocin ointment  1 Application Nasal BID   mouth rinse  15 mL Mouth Rinse 4 times per day   psyllium  1 packet Oral Daily   senna-docusate  1 tablet Oral BID   simethicone  80 mg Oral TID WC & HS   sodium chloride flush  3 mL Intravenous Q12H   traZODone  100 mg Oral QHS   Continuous Infusions:  dextrose 100 mL/hr at 04/30/22 0700   TPN ADULT (ION)       LOS: 0 days    Time spent: 40 minutes    Irine Seal, MD [facility]   To contact the attending provider between 7A-7P or the covering provider during after hours 7P-7A, please log into the web site [email or web address] and access using universal [place] password for that web site. If you do not have the password, please call the hospital operator.  04/30/2022, 10:16 AM

## 2022-04-30 NOTE — Consult Note (Signed)
Urology Consult  Referring physician: Dr. Grandville Silos Reason for referral: dysuria  Chief Complaint: Dysuria  History of Present Illness: [name] is a 71yo who was admitted with nausea and vomiting. During this hospitalization he complaints of chronic dysuria and a weak urinary stream. His symptoms have been present for 5 months. He previously saw Dr. Gloriann Loan at [facility] and has transferred his care to Dr. Luberta Robertson. He has not seen Dr. Luberta Robertson for his urinary issues. 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 He has mild dysuria which every urination. He has a weak urinary stream and straining to urinate. Nocturia 1-2x.  UA from yesterday only showed glucose. Ct abd/pelvis from yesterday shows multiple bladder calculi.   Past Medical History:  Diagnosis Date   Acute prostatitis 07/24/2007   Qualifier: Diagnosis of  By: [name] MD, [name]    Allergy    mild   Arthritis    osteoarthritis   Asthma    Blood transfusion without reported diagnosis    BPH (benign prostatic hypertrophy) with urinary obstruction    Crohn's ileitis ([place]) suspected 05/03/2017   Dilated aortic root ([place])    noted on echo 08/2012   Diverticulitis of colon    EPIDIDYMITIS 02/15/2010   Qualifier: Diagnosis of  By: [name] MD, [name]    GERD (gastroesophageal reflux disease)    H/O: GI bleed    Hemorrhoids    Hepatitis 1975   unknown type    HERPES SIMPLEX INFECTION 10/14/2007   Qualifier: Diagnosis of  By: [name] MD, [name]    Hiatal hernia    Ileus following gastrointestinal surgery ([place]) 12/26/2011   Long term (current) use of systemic steroids 06/18/2018   Psoriasis    sees [name] at [facility].   Recurrent ventral incisional hernia 05/10/2012   SVT (supraventricular tachycardia)    Ulcer 08/21/2013   ileal   Past Surgical History:  Procedure Laterality Date   BOWEL RESECTION  12/19/2011   Procedure: SMALL BOWEL RESECTION;  Surgeon: [name], MD;  Location: [facility];  Service: General;  Laterality: N/A;  with anastamosis and insertion mesh   BRONCHOSCOPY     COLON SURGERY  01/2004   x 2   COLONOSCOPY W/ BIOPSIES  04/26/2017   per [name], no polyps, benign inflammation, repeat in 5 yrs    CYSTOSCOPY     ESOPHAGOGASTRODUODENOSCOPY     HEMICOLECTOMY     left side, at [facility], diverticulitis   [place]     [phone] incisional hernia   ILEOSTOMY     ILEOSTOMY CLOSURE     INSERTION OF MESH  07/31/2012   Procedure: INSERTION OF MESH;  Surgeon: [name], MD;  Location: [facility];  Service: General;;   LAPAROTOMY  12/19/2011   Procedure: EXPLORATORY LAPAROTOMY;  Surgeon: [name], MD;  Location: [facility];  Service: General;  Laterality: N/A;   PACEMAKER IMPLANT N/A 12/01/2020   Procedure: PACEMAKER IMPLANT;  Surgeon: [name], MD;  Location: [place] [facility];  Service: Cardiovascular;  Laterality: N/A;   PACEMAKER INSERTION Left    TONSILLECTOMY     UPPER GASTROINTESTINAL ENDOSCOPY     VASECTOMY     VENTRAL HERNIA REPAIR  07/31/2012   Procedure: HERNIA REPAIR VENTRAL ADULT;  Surgeon: [name], MD;  Location: [facility];  Service: General;  Laterality: N/A;    Medications: I have reviewed  the patient's current medications. Allergies:  Allergies  Allergen Reactions   Mercaptopurine Other (See Comments)    Pancreatitis    Shellfish Allergy Anaphylaxis    Throat started to close   Humira [Adalimumab]     Developed antibodies   Wound Dressing Adhesive Rash    Family History  Problem Relation Age of Onset   Lung cancer Mother    Leukemia Father    Hypertension Father    Heart disease Father    Heart attack Father    Prostate cancer Father    Prostate cancer Paternal Uncle    Colon cancer Neg Hx    Stomach cancer Neg Hx    Colon polyps Neg Hx    Esophageal cancer Neg Hx    Rectal cancer Neg Hx    Social History:  reports that he has never  smoked. He has never used smokeless tobacco. He reports that he does not currently use alcohol. He reports current drug use. Drug: Oxycodone.  Review of Systems  Gastrointestinal:  Positive for abdominal pain.  Genitourinary:  Positive for difficulty urinating.  All other systems reviewed and are negative.   Physical Exam:  Vital signs in last 24 hours: Temp:  [97.3 F (36.3 C)-98.8 F (37.1 C)] 97.3 F (36.3 C) (10/15 1251) Pulse Rate:  [60-68] 63 (10/15 1100) Resp:  [7-21] 15 (10/15 1100) BP: (87-151)/(25-54) 151/25 (10/15 1100) SpO2:  [95 %-100 %] 99 % (10/15 1100) Weight:  [72.8 kg-72.9 kg] 72.8 kg (10/15 0950) Physical Exam Vitals reviewed.  Constitutional:      Appearance: He is well-developed.  HENT:     Head: Normocephalic and atraumatic.  Cardiovascular:     Rate and Rhythm: Normal rate and regular rhythm.  Pulmonary:     Effort: Pulmonary effort is normal. No respiratory distress.  Abdominal:     General: Abdomen is flat. There is no distension.  Skin:    General: Skin is warm and dry.  Neurological:     General: No focal deficit present.     Mental Status: He is alert and oriented to person, place, and time.  Psychiatric:        Mood and Affect: Mood normal.        Behavior: Behavior normal.     Laboratory Data:  Results for orders placed or performed during the hospital encounter of 04/29/22 (from the past 72 hour(s))  Comprehensive metabolic panel     Status: Abnormal   Collection Time: 04/29/22 12:29 PM  Result Value Ref Range   Sodium 138 135 - 145 mmol/L   Potassium 3.5 3.5 - 5.1 mmol/L   Chloride 111 98 - 111 mmol/L   CO2 23 22 - 32 mmol/L   Glucose, Bld 45 (L) 70 - 99 mg/dL    Comment: Glucose reference range applies only to samples taken after fasting for at least 8 hours.   BUN 22 8 - 23 mg/dL   Creatinine, Ser 0.84 0.61 - 1.24 mg/dL   Calcium 8.7 (L) 8.9 - 10.3 mg/dL   Total Protein 6.3 (L) 6.5 - 8.1 g/dL   Albumin 3.2 (L) 3.5 - 5.0 g/dL    AST 34 15 - 41 U/L   ALT 16 0 - 44 U/L   Alkaline Phosphatase 70 38 - 126 U/L   Total Bilirubin 0.7 0.3 - 1.2 mg/dL   GFR, Estimated >60 >60 mL/min    Comment: (NOTE) Calculated using the CKD-EPI Creatinine Equation (2021)    Anion gap 4 (L)  5 - 15    Comment: Performed at [facility], [place] [address], [place], [place] [address]  Lipase, blood     Status: None   Collection Time: 04/29/22 12:29 PM  Result Value Ref Range   Lipase 41 11 - 51 U/L    Comment: Performed at [facility], [place] [address] [place], [place] [address]  Troponin I (High Sensitivity)     Status: None   Collection Time: 04/29/22 12:29 PM  Result Value Ref Range   Troponin I (High Sensitivity) 6 <18 ng/L    Comment: (NOTE) Elevated high sensitivity troponin I (hsTnI) values and significant  changes across serial measurements may suggest ACS but many other  chronic and acute conditions are known to elevate hsTnI results.  Refer to the "Links" section for chest pain algorithms and additional  guidance. Performed at [facility], [place] [address] [place], [place] [address]   Lactic acid, plasma     Status: None   Collection Time: 04/29/22 12:34 PM  Result Value Ref Range   Lactic Acid, Venous 1.8 0.5 - 1.9 mmol/L    Comment: Performed at [facility], [place] [address] [place], [place] [address]  Magnesium     Status: None   Collection Time: 04/29/22 12:34 PM  Result Value Ref Range   Magnesium 2.0 1.7 - 2.4 mg/dL    Comment: Performed at [facility], [place] [address], [place], [place] [address]  Phosphorus     Status: Abnormal   Collection Time: 04/29/22 12:34 PM  Result Value Ref Range   Phosphorus 1.1 (L) 2.5 - 4.6 mg/dL    Comment: Performed at [facility], [place] [address] [place], [place] [address]  Triglycerides     Status: None   Collection Time: 04/29/22 12:53 PM  Result Value Ref Range    Triglycerides 84 <150 mg/dL    Comment: Performed at [facility], [place] [address], [place], [place] [address]  CBG monitoring, ED     Status: Abnormal   Collection Time: 04/29/22  1:37 PM  Result Value Ref Range   Glucose-Capillary 30 (LL) 70 - 99 mg/dL    Comment: Glucose reference range applies only to samples taken after fasting for at least 8 hours.  CBG monitoring, ED     Status: Abnormal   Collection Time: 04/29/22  2:08 PM  Result Value Ref Range   Glucose-Capillary 124 (H) 70 - 99 mg/dL    Comment: Glucose reference range applies only to samples taken after fasting for at least 8 hours.  Lactic acid, plasma     Status: None   Collection Time: 04/29/22  4:19 PM  Result Value Ref Range   Lactic Acid, Venous 1.2 0.5 - 1.9 mmol/L    Comment: Performed at [facility], [place] [address], [place], [place] [address]  Brain natriuretic peptide     Status: None   Collection Time: 04/29/22  4:19 PM  Result Value Ref Range   B Natriuretic Peptide 57.7 0.0 - 100.0 pg/mL    Comment: Performed at [facility], [place] [address], [place], [place] [address]  Troponin I (High Sensitivity)     Status: None   Collection Time: 04/29/22  4:19 PM  Result Value Ref Range   Troponin I (High Sensitivity) 9 <18 ng/L    Comment: (NOTE) Elevated high sensitivity troponin I (hsTnI) values and significant  changes across serial measurements  may suggest ACS but many other  chronic and acute conditions are known to elevate hsTnI results.  Refer to the "Links" section for chest pain algorithms and additional  guidance. Performed at [facility], [place] [address] [place], [place] [address]   CBC with Differential/Platelet     Status: Abnormal   Collection Time: 04/29/22  4:19 PM  Result Value Ref Range   WBC 6.2 4.0 - 10.5 K/uL   RBC 3.74 (L) 4.22 - 5.81 MIL/uL   Hemoglobin 9.2 (L) 13.0 - 17.0 g/dL   HCT 30.0 (L) 39.0 - 52.0 %    MCV 80.2 80.0 - 100.0 fL   MCH 24.6 (L) 26.0 - 34.0 pg   MCHC 30.7 30.0 - 36.0 g/dL   RDW 17.3 (H) 11.5 - 15.5 %   Platelets 154 150 - 400 K/uL   nRBC 0.0 0.0 - 0.2 %   Neutrophils Relative % 61 %   Neutro Abs 3.9 1.7 - 7.7 K/uL   Lymphocytes Relative 25 %   Lymphs Abs 1.5 0.7 - 4.0 K/uL   Monocytes Relative 11 %   Monocytes Absolute 0.7 0.1 - 1.0 K/uL   Eosinophils Relative 1 %   Eosinophils Absolute 0.0 0.0 - 0.5 K/uL   Basophils Relative 1 %   Basophils Absolute 0.0 0.0 - 0.1 K/uL   Immature Granulocytes 1 %   Abs Immature Granulocytes 0.04 0.00 - 0.07 K/uL    Comment: Performed at [facility], [place] [address], [place], [place] [address]  Urinalysis, Routine w reflex microscopic Urine, Clean Catch     Status: Abnormal   Collection Time: 04/29/22  4:41 PM  Result Value Ref Range   Color, Urine YELLOW YELLOW   APPearance CLEAR CLEAR   Specific Gravity, Urine <1.005 (L) 1.005 - 1.030   pH 5.0 5.0 - 8.0   Glucose, UA 150 (A) NEGATIVE mg/dL   Hgb urine dipstick NEGATIVE NEGATIVE   Bilirubin Urine NEGATIVE NEGATIVE   Ketones, ur NEGATIVE NEGATIVE mg/dL   Protein, ur NEGATIVE NEGATIVE mg/dL   Nitrite NEGATIVE NEGATIVE   Leukocytes,Ua NEGATIVE NEGATIVE    Comment: Performed at [place] [address], [place], [place] [address]  MRSA Next Gen by PCR, Nasal     Status: Abnormal   Collection Time: 04/29/22  5:16 PM   Specimen: Nasal Mucosa; Nasal Swab  Result Value Ref Range   MRSA by PCR Next Gen DETECTED (A) NOT DETECTED    Comment: CRITICAL RESULT CALLED TO, READ BACK BY AND VERIFIED WITH: DOSTER,S AT 2117 ON 04/29/22 BY LUZOLOP (NOTE) The GeneXpert MRSA Assay (FDA approved for NASAL specimens only), is one component of a comprehensive MRSA colonization surveillance program. It is not intended to diagnose MRSA infection nor to guide or monitor treatment for MRSA infections. Test performance is not FDA approved in patients less than  9 years old. Performed at [facility], [place] [address], [place], [place] [address]   Glucose, capillary     Status: None   Collection Time: 04/29/22  5:38 PM  Result Value Ref Range   Glucose-Capillary 87 70 - 99 mg/dL    Comment: Glucose reference range applies only to samples taken after fasting for at least 8 hours.  Glucose, capillary     Status: Abnormal   Collection Time: 04/29/22  8:58 PM  Result Value Ref Range   Glucose-Capillary 104 (H) 70 - 99 mg/dL    Comment: Glucose reference range applies  only to samples taken after fasting for at least 8 hours.  Glucose, capillary     Status: Abnormal   Collection Time: 04/29/22 11:39 PM  Result Value Ref Range   Glucose-Capillary 133 (H) 70 - 99 mg/dL    Comment: Glucose reference range applies only to samples taken after fasting for at least 8 hours.   Comment 1 Notify RN    Comment 2 Document in Chart   Phosphorus     Status: None   Collection Time: 04/30/22  3:03 AM  Result Value Ref Range   Phosphorus 4.4 2.5 - 4.6 mg/dL    Comment: Performed at [facility], [place] [address], [place], [place] [address]  Magnesium     Status: None   Collection Time: 04/30/22  3:03 AM  Result Value Ref Range   Magnesium 1.7 1.7 - 2.4 mg/dL    Comment: Performed at [facility], [place] [address] [place], [place] [address]  CBC with Differential/Platelet     Status: Abnormal   Collection Time: 04/30/22  3:03 AM  Result Value Ref Range   WBC 4.9 4.0 - 10.5 K/uL   RBC 3.76 (L) 4.22 - 5.81 MIL/uL   Hemoglobin 9.1 (L) 13.0 - 17.0 g/dL   HCT 30.2 (L) 39.0 - 52.0 %   MCV 80.3 80.0 - 100.0 fL   MCH 24.2 (L) 26.0 - 34.0 pg   MCHC 30.1 30.0 - 36.0 g/dL   RDW 17.5 (H) 11.5 - 15.5 %   Platelets 144 (L) 150 - 400 K/uL   nRBC 0.0 0.0 - 0.2 %   Neutrophils Relative % 44 %   Neutro Abs 2.2 1.7 - 7.7 K/uL   Lymphocytes Relative 40 %   Lymphs Abs 1.9 0.7 - 4.0 K/uL   Monocytes Relative 11 %    Monocytes Absolute 0.5 0.1 - 1.0 K/uL   Eosinophils Relative 5 %   Eosinophils Absolute 0.3 0.0 - 0.5 K/uL   Basophils Relative 0 %   Basophils Absolute 0.0 0.0 - 0.1 K/uL   Immature Granulocytes 0 %   Abs Immature Granulocytes 0.01 0.00 - 0.07 K/uL    Comment: Performed at [facility], [place] [address], [place], [place] [address]  TSH     Status: None   Collection Time: 04/30/22  3:03 AM  Result Value Ref Range   TSH 2.417 0.350 - 4.500 uIU/mL    Comment: Performed by a 3rd Generation assay with a functional sensitivity of <=0.01 uIU/mL. Performed at [facility], [place] [address] [place], [place] [address]   Comprehensive metabolic panel     Status: Abnormal   Collection Time: 04/30/22  3:03 AM  Result Value Ref Range   Sodium 139 135 - 145 mmol/L   Potassium 3.4 (L) 3.5 - 5.1 mmol/L   Chloride 110 98 - 111 mmol/L   CO2 24 22 - 32 mmol/L   Glucose, Bld 102 (H) 70 - 99 mg/dL    Comment: Glucose reference range applies only to samples taken after fasting for at least 8 hours.   BUN 13 8 - 23 mg/dL   Creatinine, Ser 0.77 0.61 - 1.24 mg/dL   Calcium 8.1 (L) 8.9 - 10.3 mg/dL   Total Protein 5.5 (L) 6.5 - 8.1 g/dL   Albumin 2.7 (L) 3.5 - 5.0 g/dL   AST 18 15 - 41 U/L   ALT 14 0 - 44 U/L   Alkaline Phosphatase 57 38 - 126  U/L   Total Bilirubin 0.5 0.3 - 1.2 mg/dL   GFR, Estimated >60 >60 mL/min    Comment: (NOTE) Calculated using the CKD-EPI Creatinine Equation (2021)    Anion gap 5 5 - 15    Comment: Performed at [facility], [place] [address] [place], [place] [address]  Protime-INR     Status: None   Collection Time: 04/30/22  3:03 AM  Result Value Ref Range   Prothrombin Time 15.2 11.4 - 15.2 seconds   INR 1.2 0.8 - 1.2    Comment: (NOTE) INR goal varies based on device and disease states. Performed at [facility], [place] [address] [place], [place] [address]   Triglycerides     Status: None    Collection Time: 04/30/22  3:03 AM  Result Value Ref Range   Triglycerides 42 <150 mg/dL    Comment: Performed at [facility], [place] [address], [place], [place] [address]  Glucose, capillary     Status: None   Collection Time: 04/30/22  3:20 AM  Result Value Ref Range   Glucose-Capillary 96 70 - 99 mg/dL    Comment: Glucose reference range applies only to samples taken after fasting for at least 8 hours.  Glucose, capillary     Status: Abnormal   Collection Time: 04/30/22  8:21 AM  Result Value Ref Range   Glucose-Capillary 100 (H) 70 - 99 mg/dL    Comment: Glucose reference range applies only to samples taken after fasting for at least 8 hours.   Comment 1 Notify RN    Comment 2 Document in Chart   Glucose, capillary     Status: Abnormal   Collection Time: 04/30/22 12:09 PM  Result Value Ref Range   Glucose-Capillary 132 (H) 70 - 99 mg/dL    Comment: Glucose reference range applies only to samples taken after fasting for at least 8 hours.   Comment 1 Notify RN    Comment 2 Document in Chart    Recent Results (from the past 240 hour(s))  MRSA Next Gen by PCR, Nasal     Status: Abnormal   Collection Time: 04/29/22  5:16 PM   Specimen: Nasal Mucosa; Nasal Swab  Result Value Ref Range Status   MRSA by PCR Next Gen DETECTED (A) NOT DETECTED Final    Comment: CRITICAL RESULT CALLED TO, READ BACK BY AND VERIFIED WITH: DOSTER,S AT 2117 ON 04/29/22 BY LUZOLOP (NOTE) The GeneXpert MRSA Assay (FDA approved for NASAL specimens only), is one component of a comprehensive MRSA colonization surveillance program. It is not intended to diagnose MRSA infection nor to guide or monitor treatment for MRSA infections. Test performance is not FDA approved in patients less than 63 years old. Performed at [facility], [place] [address], [place], [place] [address]    Creatinine: Recent Labs    04/27/22 1238 04/29/22 1229 04/30/22 0303  CREATININE 0.80 0.84  0.77   Baseline Creatinine: 0.8  Impression/Assessment:  71yo with BPH with dysuria, bladder calculi, possible prostatitis  Plan:  We discussed the management of his BPH including continued medical therapy, Rezum, Urolift, TURP and simple prostatectomy. After discussing the options the patient has elected to proceed with medical therapy. We will start flomax 0.36m daily  Bladder calculi: I discussed the management of bladder calculi with the patient including transurethral removal. The patient would likel to schedule this procedure with Dr. DLuberta Robertsonas an outpatient Chronci prostatitis: We will start bactrim DS BID for 28  days  Nicolette Bang 04/30/2022, 12:57 PM

## 2022-05-01 DIAGNOSIS — E162 Hypoglycemia, unspecified: Secondary | ICD-10-CM | POA: Diagnosis not present

## 2022-05-01 DIAGNOSIS — N400 Enlarged prostate without lower urinary tract symptoms: Secondary | ICD-10-CM

## 2022-05-01 DIAGNOSIS — N210 Calculus in bladder: Secondary | ICD-10-CM | POA: Diagnosis present

## 2022-05-01 DIAGNOSIS — K219 Gastro-esophageal reflux disease without esophagitis: Secondary | ICD-10-CM | POA: Diagnosis not present

## 2022-05-01 DIAGNOSIS — K50918 Crohn's disease, unspecified, with other complication: Secondary | ICD-10-CM | POA: Diagnosis not present

## 2022-05-01 DIAGNOSIS — N411 Chronic prostatitis: Secondary | ICD-10-CM | POA: Diagnosis present

## 2022-05-01 LAB — COMPREHENSIVE METABOLIC PANEL
ALT: 13 U/L (ref 0–44)
AST: 21 U/L (ref 15–41)
Albumin: 2.8 g/dL — ABNORMAL LOW (ref 3.5–5.0)
Alkaline Phosphatase: 55 U/L (ref 38–126)
Anion gap: 3 — ABNORMAL LOW (ref 5–15)
BUN: 9 mg/dL (ref 8–23)
CO2: 26 mmol/L (ref 22–32)
Calcium: 7.9 mg/dL — ABNORMAL LOW (ref 8.9–10.3)
Chloride: 107 mmol/L (ref 98–111)
Creatinine, Ser: 0.87 mg/dL (ref 0.61–1.24)
GFR, Estimated: >60 mL/min (ref >60)
Glucose, Bld: 109 mg/dL — ABNORMAL HIGH (ref 70–99)
Potassium: 3.7 mmol/L (ref 3.5–5.1)
Sodium: 136 mmol/L (ref 135–145)
Total Bilirubin: 0.4 mg/dL (ref 0.3–1.2)
Total Protein: 5.5 g/dL — ABNORMAL LOW (ref 6.5–8.1)

## 2022-05-01 LAB — CBC WITH DIFFERENTIAL/PLATELET
Abs Immature Granulocytes: 0 10*3/uL (ref 0.00–0.07)
Basophils Absolute: 0 10*3/uL (ref 0.0–0.1)
Basophils Relative: 1 %
Eosinophils Absolute: 0.1 10*3/uL (ref 0.0–0.5)
Eosinophils Relative: 4 %
HCT: 29.7 % — ABNORMAL LOW (ref 39.0–52.0)
Hemoglobin: 8.8 g/dL — ABNORMAL LOW (ref 13.0–17.0)
Immature Granulocytes: 0 %
Lymphocytes Relative: 33 %
Lymphs Abs: 1.1 10*3/uL (ref 0.7–4.0)
MCH: 24.2 pg — ABNORMAL LOW (ref 26.0–34.0)
MCHC: 29.6 g/dL — ABNORMAL LOW (ref 30.0–36.0)
MCV: 81.6 fL (ref 80.0–100.0)
Monocytes Absolute: 0.4 10*3/uL (ref 0.1–1.0)
Monocytes Relative: 11 %
Neutro Abs: 1.8 10*3/uL (ref 1.7–7.7)
Neutrophils Relative %: 51 %
Platelets: 142 10*3/uL — ABNORMAL LOW (ref 150–400)
RBC: 3.64 MIL/uL — ABNORMAL LOW (ref 4.22–5.81)
RDW: 17.4 % — ABNORMAL HIGH (ref 11.5–15.5)
WBC: 3.4 10*3/uL — ABNORMAL LOW (ref 4.0–10.5)
nRBC: 0 % (ref 0.0–0.2)

## 2022-05-01 LAB — PHOSPHORUS: Phosphorus: 3.3 mg/dL (ref 2.5–4.6)

## 2022-05-01 LAB — GLUCOSE, CAPILLARY
Glucose-Capillary: 106 mg/dL — ABNORMAL HIGH (ref 70–99)
Glucose-Capillary: 108 mg/dL — ABNORMAL HIGH (ref 70–99)
Glucose-Capillary: 109 mg/dL — ABNORMAL HIGH (ref 70–99)
Glucose-Capillary: 110 mg/dL — ABNORMAL HIGH (ref 70–99)
Glucose-Capillary: 99 mg/dL (ref 70–99)

## 2022-05-01 LAB — MAGNESIUM: Magnesium: 2 mg/dL (ref 1.7–2.4)

## 2022-05-01 LAB — TRIGLYCERIDES: Triglycerides: 90 mg/dL (ref <150)

## 2022-05-01 MED ORDER — PROCHLORPERAZINE EDISYLATE 10 MG/2ML IJ SOLN: 10.0000 mg | Freq: Four times a day (QID) | INTRAMUSCULAR | Status: DC | PRN

## 2022-05-01 MED ORDER — PROCHLORPERAZINE EDISYLATE 10 MG/2ML IJ SOLN
5.0000 mg | Freq: Four times a day (QID) | INTRAMUSCULAR | Status: DC | PRN
  Administered 2022-05-01 – 2022-05-03 (×2): 5 mg via INTRAVENOUS
  Filled 2022-05-01 (×2): qty 2

## 2022-05-01 MED ORDER — TRACE MINERALS CU-MN-SE-ZN 300-55-60-3000 MCG/ML IV SOLN
INTRAVENOUS | Status: AC
  Filled 2022-05-01: qty 676

## 2022-05-01 MED ORDER — ENSURE ENLIVE PO LIQD
237.0000 mL | ORAL | Status: DC
  Administered 2022-05-01 – 2022-05-03 (×2): 237 mL via ORAL

## 2022-05-01 NOTE — Progress Notes (Signed)
PROGRESS NOTE    [name]  VHQ:[id number] DOB: [birth year] DOA: 04/29/2022 PCP: [name], MD    Chief Complaint  Patient presents with   Abdominal Pain    Brief Narrative:   [name] is a 71 y.o. male with medical history significant of Crohn's disease and complex past surgical history including exploratory laparotomy with small bowel resection, lysis of adhesions, mesh explantation, diverting loop ileostomy April 2023, recently [facility] with failure to thrive and stoma retraction.  Patient started on TPN during that hospitalization in addition to oral intake and discharged to skilled nursing facility.  Patient presenting back from facility with abdominal pain nausea vomiting some shortness of breath and chest tightness which started soon after he received a liter of TPN over 1 hour bolus by mistake.  Patient noted to supposedly be on TPN at 60 cc an hour.  Patient did state that after receiving the fluid he had diffuse abdominal pain, diffuse aches all over, nausea, dry heaves, vomiting, some shortness of breath, chest tightness which had improved at the time of my evaluation.  Patient denied any fevers, no diarrhea or constipation, no melena, no lip or tongue swelling, no difficulty swallowing.  Patient does endorse some lightheadedness, dizziness, dysuria.  Patient denies any penile discharge.  Patient does state ostomy output has been stable.  Chronic abdominal pain also stable.   ED Course: Patient seen in the ED, comprehensive metabolic profile done with a glucose of 45, creatinine of 0.84, potassium of 3.5, calcium of 8.7, albumin of 3.2, protein of 6.3 otherwise within normal limits.  Phosphorus level was 1.1, magnesium at 2.0.  Triglycerides at 84.  High-sensitivity troponin negative.  Lactic acid level at 1.8.  CBC pending.  Urinalysis pending.  Chest x-ray with no acute cardiopulmonary abnormalities.  CT  angiogram chest done negative for PE.  Minimal bilateral pleural effusions with minimal dependent atelectasis of the posterior lung bases.  CT abdomen and pelvis with no acute abnormalities identified, evidence of previous partial right colon resection with ileocolonic anastomosis and left hemicolectomy with anastomosis between transverse and sigmoid colon.  Surrounding inflammatory change of right lower quadrant is not significantly changed compared to prior CT of April 27, 2022.  Aortic atherosclerosis.  Patient noted to have hypotensive/soft blood pressures in the ED with systolics in the 41L with some response to IV fluids.  Patient with no wheezing and no significant shortness of breath and as such not given any IV steroids.  CBG initially noted at 30 and patient placed on a D10 infusion.   Assessment & Plan:   Principal Problem:   Hypoglycemia Active Problems:   Hypotension   Crohn's disease (HCC)   Hypophosphatemia   Attention deficit hyperactivity disorder (ADHD)   GERD   BPH (benign prostatic hyperplasia)   Ileostomy in place [facility])   Pacemaker   Protein-calorie malnutrition, severe   Nausea and vomiting   Dysuria   Chronic prostatitis   Bladder calculi  #1 hypoglycemia/abdominal pain/nausea and vomiting -Likely secondary to inadvertent TPN bolus given that SNF. -CBG noted at 30 and patient placed on a D10 IV fluids. -Patient with no signs or symptoms of infection.  Currently afebrile.  Normal white count.  CT angiogram chest, abdomen and pelvis negative for any acute abnormalities. -Urinalysis nitrite negative, leukocytes negative.   -Blood cultures pending with no growth to date. -Urine cultures negative. -D10 has been discontinued as TPN has  been started.   -Blood glucose of 99 this morning. -IV antiemetics, supportive care.   2.  Hypotension -Patient noted to be hypotensive on presentation to the ED, blood pressure seems to be somewhat responding to IV  fluids. -Patient with no signs or symptoms of infection.  -Blood pressure improving however diastolic still low.   Afebrile. -Patient with no overt bleeding.  -Hemoglobin stable at 8.8. -Repeat blood pressure manually. -Status post IV albumin x1 04/30/2022. -IV fluids saline lock. -Supportive care.   3.  Hypophosphatemia -Status post K-Phos 30 mmol IV x1. -Phosphorus at 3.3 today from 1.1 on admission. -On TPN. -Repeat labs in the AM.   4.  GERD -PPI.     5.  Status post PPM -Stable.   6.  History of Crohn's disease with complex past surgical history including exploratory laparotomy with small bowel resection, lysis of adhesion, mesh explantation, diverting loop ileostomy April 2023 -Currently stable. -CT abdomen and pelvis done with no acute changes. -Ileostomy intact.  Stable output. -IV fluids, supportive care. -Outpatient follow-up with primary general surgeon and primary gastroenterologist.   7.  Severe protein calorie malnutrition -On TPN and regular diet orally prior to admission. -Due to accidental bolus of TPN given prior to presentation TPN held on admission and resumed yesterday evening, 04/30/2022 per pharmacy.  -Continue current regular diet orally.   -Supportive care.  8.  Dysuria/urinary pain/felt secondary to chronic prostatitis/BPH -Patient with complaints of dysuria/urinary pain ongoing for the past 2 to 3 weeks, states he has outpatient appointment with urology however is requesting that he be evaluated by urology during this hospitalization. -Urinalysis which was done nonconcerning for UTI. -Patient seen in consultation by urology who assessed patient and patient started on treatment for chronic prostatitis with Bactrim DS twice daily x28 days, patient also started on Flomax 0.4 mg daily for BPH.   -Outpatient follow-up with urology.  9.  Bladder calculi -Urology discussed with patient management of bladder calculi including transurethral removal, it is  noted per urology that patient would like to schedule this procedure with Dr. Diona Fanti in the outpatient setting.     DVT prophylaxis: Lovenox Code Status: Full Family Communication: Updated patient.  No family at bedside. Disposition: Back to SNF once medically stable, electrolytes corrected, resolution of hypoglycemia.  Status is: Observation The patient remains OBS appropriate and will d/c before 2 midnights.   Consultants:  Urology: [name] 04/30/2022  Procedures:  CT angiogram chest 04/29/2022 CT abdomen and pelvis 04/29/2022 Chest x-ray 04/29/2022   Antimicrobials: Bactrim 04/30/2022>>>>   Subjective: Laying in bed, overall feeling better.  Still with some dysuria and bladder pain.  States when TPN was started worsening his urinary/pelvic pain.  No nausea or vomiting.  Tolerating oral intake.  No chest pain.  No shortness of breath.   Objective: Vitals:   05/01/22 0600 05/01/22 0700 05/01/22 0735 05/01/22 0800  BP: (!) 137/34 (!) 136/26  (!) 134/31  Pulse: 75 72 77 72  Resp: (!) 6 12 10 12   Temp:    98.3 F (36.8 C)  TempSrc:    Oral  SpO2: 95% 95% 97% 96%  Weight:      Height:        Intake/Output Summary (Last 24 hours) at 05/01/2022 0850 Last data filed at 05/01/2022 0735 Gross per 24 hour  Intake 2127.56 ml  Output 2515 ml  Net -387.44 ml    Filed Weights   04/30/22 0500 04/30/22 0950 05/01/22 0438  Weight: 72.9 kg 72.8  kg 70 kg    Examination:  General exam: NAD. Respiratory system: Lungs clear to auscultation bilaterally anterior lung fields.  No wheezes, no crackles, no rhonchi.  Fair air movement.  Speaking in full sentences.   Cardiovascular system: Regular rate rhythm no murmurs rubs or gallops.  No JVD.  No lower extremity edema.  Gastrointestinal system: Abdomen is soft, nondistended, nontender, positive bowel sounds.  PEG tube intact.  Ileostomy intact. Central nervous system: Alert and oriented. No focal neurological  deficits. Extremities: Symmetric 5 x 5 power. Skin: No rashes, lesions or ulcers Psychiatry: Judgement and insight appear normal. Mood & affect appropriate.     Data Reviewed: I have personally reviewed following labs and imaging studies  CBC: Recent Labs  Lab 04/27/22 1238 04/29/22 1619 04/30/22 0303 05/01/22 0447  WBC 6.2 6.2 4.9 3.4*  NEUTROABS 3.2 3.9 2.2 1.8  HGB 10.0* 9.2* 9.1* 8.8*  HCT 33.0* 30.0* 30.2* 29.7*  MCV 80.7 80.2 80.3 81.6  PLT 175 154 144* 142*     Basic Metabolic Panel: Recent Labs  Lab 04/27/22 1238 04/29/22 1229 04/29/22 1234 04/30/22 0303 05/01/22 0447  NA 135 138  --  139 136  K 3.9 3.5  --  3.4* 3.7  CL 106 111  --  110 107  CO2 25 23  --  24 26  GLUCOSE 88 45*  --  102* 109*  BUN 25* 22  --  13 9  CREATININE 0.80 0.84  --  0.77 0.87  CALCIUM 8.8* 8.7*  --  8.1* 7.9*  MG  --   --  2.0 1.7 2.0  PHOS  --   --  1.1* 4.4 3.3     GFR: Estimated Creatinine Clearance: 79.3 mL/min (by C-G formula based on SCr of 0.87 mg/dL).  Liver Function Tests: Recent Labs  Lab 04/27/22 1238 04/29/22 1229 04/30/22 0303 05/01/22 0447  AST 26 34 18 21  ALT 18 16 14 13   ALKPHOS 73 70 57 55  BILITOT 0.5 0.7 0.5 0.4  PROT 6.5 6.3* 5.5* 5.5*  ALBUMIN 3.2* 3.2* 2.7* 2.8*     CBG: Recent Labs  Lab 04/30/22 1209 04/30/22 1612 04/30/22 1943 04/30/22 2329 05/01/22 0543  GLUCAP 132* 108* 99 124* 99      Recent Results (from the past 240 hour(s))  Urine Culture     Status: None   Collection Time: 04/29/22  4:42 PM   Specimen: Urine, Clean Catch  Result Value Ref Range Status   Specimen Description   Final    URINE, CLEAN CATCH Performed at [facility], [place] [address] [place], [place] [address]    Special Requests   Final    NONE Performed at [facility], [place] [address], [place], [place] [address]    Culture   Final    NO GROWTH Performed at [facility], [place] [address] [place],  [place] [address]    Report Status 04/30/2022 FINAL  Final  MRSA Next Gen by PCR, Nasal     Status: Abnormal   Collection Time: 04/29/22  5:16 PM   Specimen: Nasal Mucosa; Nasal Swab  Result Value Ref Range Status   MRSA by PCR Next Gen DETECTED (A) NOT DETECTED Final    Comment: CRITICAL RESULT CALLED TO, READ BACK BY AND VERIFIED WITH: DOSTER,S AT 2117 ON 04/29/22 BY LUZOLOP (NOTE) The GeneXpert MRSA Assay (FDA approved for NASAL specimens only), is one component of a comprehensive MRSA colonization surveillance program.  It is not intended to diagnose MRSA infection nor to guide or monitor treatment for MRSA infections. Test performance is not FDA approved in patients less than 49 years old. Performed at [facility], [place] [address], [place], [place] [address]          Radiology Studies: CT Angio Chest PE W and/or Wo Contrast  Result Date: 04/29/2022 CLINICAL DATA:  Positive D-dimers. Assess for pulmonary embolus. Acute nonlocalized abdomen pain. EXAM: CT ANGIOGRAPHY CHEST CT ABDOMEN AND PELVIS WITH CONTRAST TECHNIQUE: Multidetector CT imaging of the chest was performed using the standard protocol during bolus administration of intravenous contrast. Multiplanar CT image reconstructions and MIPs were obtained to evaluate the vascular anatomy. Multidetector CT imaging of the abdomen and pelvis was performed using the standard protocol during bolus administration of intravenous contrast. RADIATION DOSE REDUCTION: This exam was performed according to the departmental dose-optimization program which includes automated exposure control, adjustment of the mA and/or kV according to patient size and/or use of iterative reconstruction technique. CONTRAST:  151m OMNIPAQUE IOHEXOL 350 MG/ML SOLN COMPARISON:  CT abdomen and pelvis April 27, 2022, abdomen pelvic CT September 06, 2018 FINDINGS: CTA CHEST FINDINGS Cardiovascular: Satisfactory opacification of the pulmonary arteries to the  segmental level. No evidence of pulmonary embolism. Normal heart size. No pericardial effusion. Mediastinum/Nodes: No enlarged mediastinal, hilar, or axillary lymph nodes. Thyroid gland, trachea, and esophagus demonstrate no significant findings. Lungs/Pleura: There is no focal pneumonia or pulmonary edema. Minimal bilateral pleural effusions are noted. Stable lateral right lower lobe subpleural scar is unchanged compared to prior exams. Minimal dependent atelectasis of posterior lung bases are noted. Musculoskeletal: Degenerative joint changes of the spine are noted. Review of the MIP images confirms the above findings. CT ABDOMEN and PELVIS FINDINGS Hepatobiliary: No focal liver abnormality is seen. No gallstones, gallbladder wall thickening, or biliary dilatation. Pancreas: Unremarkable. No pancreatic ductal dilatation or surrounding inflammatory changes. Spleen: Normal in size without focal abnormality. Adrenals/Urinary Tract: The bilateral adrenal glands are normal. No renal calculi is identified. No focal kidney lesion is noted. No hydronephrosis bilaterally. Dependent bladder calculi identified in a partially decompressed bladder. Stomach/Bowel: A diverting ileostomy is identified in the right lower quadrant. There is evidence of previous partial right colon resection with ileocolonic anastomosis and left hemi colectomy with anastomosis between transverse and sigmoid colon. The surrounding inflammatory change of the right lower quadrant is not significantly changed compared to prior CT of April 27, 2022. the stomach is normal. No free air is identified. Vascular/Lymphatic: Aortic atherosclerosis. No enlarged abdominal or pelvic lymph nodes. Reproductive: Calcifications identified within the prostate gland measuring 5 cm in diameter. Other: Stable postsurgical changes in the anterior abdominal wall compared to prior exam of April 27, 2022. Musculoskeletal: No acute abnormality. Review of the MIP images  confirms the above findings. IMPRESSION: 1. No pulmonary embolus. 2. Minimal bilateral pleural effusions with minimal dependent atelectasis of posterior lung bases. 3. No acute abnormality identified in the abdomen and pelvis. 4. Evidence of previous partial right colon resection with ileocolonic anastomosis and left hemi colectomy with anastomosis between transverse and sigmoid colon. The surrounding inflammatory change of the right lower quadrant is not significantly changed compared to prior CT of April 27, 2022. 5. Aortic atherosclerosis. Aortic Atherosclerosis (ICD10-I70.0). Electronically Signed   By: WAbelardo DieselM.D.   On: 04/29/2022 14:45   CT ABDOMEN PELVIS W CONTRAST  Result Date: 04/29/2022 CLINICAL DATA:  Positive D-dimers. Assess for pulmonary embolus. Acute nonlocalized abdomen pain. EXAM: CT  ANGIOGRAPHY CHEST CT ABDOMEN AND PELVIS WITH CONTRAST TECHNIQUE: Multidetector CT imaging of the chest was performed using the standard protocol during bolus administration of intravenous contrast. Multiplanar CT image reconstructions and MIPs were obtained to evaluate the vascular anatomy. Multidetector CT imaging of the abdomen and pelvis was performed using the standard protocol during bolus administration of intravenous contrast. RADIATION DOSE REDUCTION: This exam was performed according to the departmental dose-optimization program which includes automated exposure control, adjustment of the mA and/or kV according to patient size and/or use of iterative reconstruction technique. CONTRAST:  160m OMNIPAQUE IOHEXOL 350 MG/ML SOLN COMPARISON:  CT abdomen and pelvis April 27, 2022, abdomen pelvic CT September 06, 2018 FINDINGS: CTA CHEST FINDINGS Cardiovascular: Satisfactory opacification of the pulmonary arteries to the segmental level. No evidence of pulmonary embolism. Normal heart size. No pericardial effusion. Mediastinum/Nodes: No enlarged mediastinal, hilar, or axillary lymph nodes. Thyroid  gland, trachea, and esophagus demonstrate no significant findings. Lungs/Pleura: There is no focal pneumonia or pulmonary edema. Minimal bilateral pleural effusions are noted. Stable lateral right lower lobe subpleural scar is unchanged compared to prior exams. Minimal dependent atelectasis of posterior lung bases are noted. Musculoskeletal: Degenerative joint changes of the spine are noted. Review of the MIP images confirms the above findings. CT ABDOMEN and PELVIS FINDINGS Hepatobiliary: No focal liver abnormality is seen. No gallstones, gallbladder wall thickening, or biliary dilatation. Pancreas: Unremarkable. No pancreatic ductal dilatation or surrounding inflammatory changes. Spleen: Normal in size without focal abnormality. Adrenals/Urinary Tract: The bilateral adrenal glands are normal. No renal calculi is identified. No focal kidney lesion is noted. No hydronephrosis bilaterally. Dependent bladder calculi identified in a partially decompressed bladder. Stomach/Bowel: A diverting ileostomy is identified in the right lower quadrant. There is evidence of previous partial right colon resection with ileocolonic anastomosis and left hemi colectomy with anastomosis between transverse and sigmoid colon. The surrounding inflammatory change of the right lower quadrant is not significantly changed compared to prior CT of April 27, 2022. the stomach is normal. No free air is identified. Vascular/Lymphatic: Aortic atherosclerosis. No enlarged abdominal or pelvic lymph nodes. Reproductive: Calcifications identified within the prostate gland measuring 5 cm in diameter. Other: Stable postsurgical changes in the anterior abdominal wall compared to prior exam of April 27, 2022. Musculoskeletal: No acute abnormality. Review of the MIP images confirms the above findings. IMPRESSION: 1. No pulmonary embolus. 2. Minimal bilateral pleural effusions with minimal dependent atelectasis of posterior lung bases. 3. No acute  abnormality identified in the abdomen and pelvis. 4. Evidence of previous partial right colon resection with ileocolonic anastomosis and left hemi colectomy with anastomosis between transverse and sigmoid colon. The surrounding inflammatory change of the right lower quadrant is not significantly changed compared to prior CT of April 27, 2022. 5. Aortic atherosclerosis. Aortic Atherosclerosis (ICD10-I70.0). Electronically Signed   By: WAbelardo DieselM.D.   On: 04/29/2022 14:45   DG Chest Portable 1 View  Result Date: 04/29/2022 CLINICAL DATA:  Generalized weakness with chest pain EXAM: PORTABLE CHEST 1 VIEW COMPARISON:  04/09/2022 FINDINGS: Dual-chamber pacer leads from the left in unchanged position, including outward directed right atrial lead. Central line on the right with tip at the upper cavoatrial junction. Artifact from EKG leads. There is no edema, consolidation, effusion, or pneumothorax. IMPRESSION: No acute finding when compared to 04/09/2022 Electronically Signed   By: JJorje GuildM.D.   On: 04/29/2022 12:29        Scheduled Meds:  busPIRone  5 mg Oral TID  Chlorhexidine Gluconate Cloth  6 each Topical Daily   enoxaparin (LOVENOX) injection  40 mg Subcutaneous QHS   insulin aspart  0-9 Units Subcutaneous Q6H   melatonin  3 mg Oral QHS   mupirocin ointment  1 Application Nasal BID   mouth rinse  15 mL Mouth Rinse 4 times per day   psyllium  1 packet Oral Daily   senna-docusate  1 tablet Oral BID   simethicone  80 mg Oral TID WC & HS   sodium chloride flush  3 mL Intravenous Q12H   sulfamethoxazole-trimethoprim  1 tablet Oral Q12H   tamsulosin  0.4 mg Oral QPC supper   traZODone  100 mg Oral QHS   Continuous Infusions:  sodium chloride 10 mL/hr at 05/01/22 0700   TPN ADULT (ION) 65 mL/hr at 05/01/22 0700   TPN ADULT (ION)       LOS: 1 day    Time spent: 40 minutes    Irine Seal, MD [facility]   To contact the attending provider between 7A-7P or  the covering provider during after hours 7P-7A, please log into the web site [email or web address] and access using universal [place] password for that web site. If you do not have the password, please call the hospital operator.  05/01/2022, 8:50 AM

## 2022-05-01 NOTE — Consult Note (Signed)
WOC re consulted essentially patient wanted to see one of the ostomy nurse.  I have seen Mr. Conerly in the past and realize he does have a bit of depression and anxiety.  Confirmed with patient the 1pc flex convex is working well. He has 3 more in the room now with 3 barrier rings. Wanted a belt however the large belt is too large for him, I am not able to obtain a medium belt for him, not in stock at the [facility].   Discussed POC with patient and bedside nurse.  Re consult if needed, will not follow at this time. Thanks  Jessel Gettinger [employer], RN,CWOCN, CNS, [place] [phone])

## 2022-05-01 NOTE — Progress Notes (Signed)
PHARMACY - TOTAL PARENTERAL NUTRITION CONSULT NOTE   Indication: Crohn's disease and complex past surgical history including ex lap with small bowel resection, lysis of adhesion, mesh explantation, and diverting loop ileostomy in April 2023, on TPN prior to admission  Patient Measurements: Height: 5' 10"  (177.8 cm) Weight: 70 kg (154 lb 5.2 oz) IBW/kg (Calculated) : 73 TPN AdjBW (KG): 70.3 Body mass index is 22.14 kg/m.  Assessment: Patient presented with hypoglycemia/abdominal pain/nausea and vomiting. Staff from SNF reported pt set the TPN infusion to open rate causing current issues that presented to ER with. To continue TPN as inpatient, will get formula as close to outpatient formula as possible  Discharge from [facility] 04/19/22:  [facility]. Their contact information is as follows: Name: [name], PharmD, ph: [phone], email: [email or web address].   Previous RD: Estimated Nutrient Requirements at [facility] Protein: 1.5 - 1.7 g/kg/day (98 - 104 g/day) Total kcal: 30 - 35 kcal/kg/day (1830 - 1983 kcal/day)   TPN Formulation   [A 6.6%, D 20%, F 3.1%] @ goal rate, 65 mL/hr (TV: 1560 mL/day) provides:  Protein 1.7 g/kg/day 103 g/day  Total kcal 32 kcal/kg/day 1971 kcal/day  NPC: N ratio 94   TPN Goal rate: 65 mL/hr (TV: 1560 mL/day)   Glucose / Insulin: No hx DM - CBGs at goal < 150 - 2 units SSI required Electrolytes: WNL except K slightly at low end of goal 3.7 - PO repletion given yesterday; Mg WNL after repletion yesterday Renal: BUN/SCr WNL, UOP 16106m/24hr Hepatic: LFTs WNL, Albumin x 1 10/15 Intake / Output; [facility] GI Imaging: - 10/14 CTa/p: no acute abnormalities; Evidence of previous partial right colon resection with ileocolonic anastomosis and left hemi colectomy with anastomosis between transverse and sigmoid colon. The surrounding inflammatory change of the right lower quadrant is not significantly changed  compared to prior CT of April 27, 2022. GI Surgeries / Procedures:   Central access: had prior to admission - PICC TPN start date: started PTA  Nutritional Goals: Goal TPN rate is 65 mL/hr (provides 101 g of protein and 1775 kcals per day)  RD Assessment:    Current Nutrition:  Eating regular diet  Plan:  Continue TPN at goal rate 65 mL/hr at 1800 Electrolytes in TPN: Na 55m/L, K 5551mL, Ca 5mE85m, Mg 5mEq37m and Phos 15mmo74m Cl:Ac 1:1 Add standard MVI and trace elements to TPN Start Sensitive q6h SSI and adjust as needed  Continue IVFs per Md Monitor TPN labs on Mon/Thurs and prn  [name] WPeggyann JubamD, BCPS Pharmacy: [phone]/2023,7:02 AM

## 2022-05-01 NOTE — TOC Initial Note (Addendum)
Transition of Care [facility]) - Initial/Assessment Note    Patient Details  Name: [name] MRN: [id number] Date of Birth: 01-13-1951  Transition [facility] CM/SW Contact:    Leeroy Cha, RN Phone Number: 05/01/2022, 7:45 AM  Clinical Narrative:                 Pt work up for snf placement.   NIO:[id number] Passar : [phone] A Fl2 sent to [facility] snf where patient is from. Expected Discharge Plan: Skilled Nursing Facility Barriers to Discharge: Continued Medical Work up   Patient Goals and CMS Choice Patient states their goals for this hospitalization and ongoing recovery are:: i would like to be able to go back home CMS Medicare.gov Compare Post Acute Care list provided to:: Patient Choice offered to / list presented to : Patient  Expected Discharge Plan and Services Expected Discharge Plan: [place]   Discharge Planning Services: CM Consult Post Acute Care Choice: [place] Living arrangements for the past 2 months: Single Family Home                                      Prior Living Arrangements/Services Living arrangements for the past 2 months: Single Family Home Lives with:: Spouse Patient language and need for interpreter reviewed:: Yes              Criminal Activity/Legal Involvement Pertinent to Current Situation/Hospitalization: No - Comment as needed  Activities of Daily Living Home Assistive Devices/Equipment: Eyeglasses ADL Screening (condition at time of admission) Patient's cognitive ability adequate to safely complete daily activities?: Yes Is the patient deaf or have difficulty hearing?: No Does the patient have difficulty seeing, even when wearing glasses/contacts?: No Does the patient have difficulty concentrating, remembering, or making decisions?: No Patient able to express need for assistance with ADLs?: Yes Does the patient have difficulty dressing or bathing?: No Independently performs  ADLs?: Yes (appropriate for developmental age) Does the patient have difficulty walking or climbing stairs?: No Weakness of Legs: None Weakness of Arms/Hands: None  Permission Sought/Granted                  Emotional Assessment Appearance:: Appears stated age Attitude/Demeanor/Rapport: Engaged Affect (typically observed): Calm Orientation: : Oriented to Situation, Oriented to  Time, Oriented to Place, Oriented to Self Alcohol / Substance Use: Not Applicable Psych Involvement: No (comment)  Admission diagnosis:  Hypoglycemia [E16.2] Patient Active Problem List   Diagnosis Date Noted   Chronic prostatitis 05/01/2022   Bladder calculi 05/01/2022   Nausea and vomiting    Dysuria    Hypoglycemia 04/29/2022   Hypotension 04/29/2022   Pelvic floor dysfunction 04/27/2022   Crohn's disease of small intestine with other complication ([place]) 96/78/9381   Protein-calorie malnutrition, severe 01/05/2022   Nausea 01/05/2022   AKI (acute kidney injury) ([place]) 01/03/2022   Leukocytosis 01/03/2022   Transaminitis 01/03/2022   Crohn's disease ([place]) 01/03/2022   Ileostomy in place [facility]) 01/03/2022   Pacemaker 01/03/2022   Sepsis ([place]) 09/21/2021   Thrombocytopenia ([place]) 09/21/2021   Hypocalcemia 09/21/2021   Hypophosphatemia 09/21/2021   Hypokalemia 09/21/2021   External hemorrhoid, thrombosed 05/27/2019   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   Drug-induced acute pancreatitis without infection or necrosis - from 6 MP 09/24/2017   Long-term use of immunosuppressant medication  - Entyvio 06/28/2017   Crohn's ileitis ([place])  05/03/2017   Psoriasis 08/23/2016  Insomnia 04/22/2014   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

## 2022-05-01 NOTE — Progress Notes (Signed)
04/30/2022 Patient refused 2200 dose of Lovenox. States he has been refusing doses because he "does not like subcutaneous injections due to pain they cause". Explained rationale for the injections, patient verbalized understanding but still refused. On-call provider notified. Cindy S. Brigitte Pulse BSN, RN, [place] 05/01/2022 3:13 AM

## 2022-05-01 NOTE — Progress Notes (Signed)
Initial Nutrition Assessment  DOCUMENTATION CODES:   Severe malnutrition in context of chronic illness  INTERVENTION:  -Recommend increasing caloric content of TPN tomorrow if pt remains admitted on 10/17: 110g amino acids (1.57g/kg, 23%kcal), 275g dextrose (GIR 2.73, 50%kcal), and 50g lipids (27%kcal) to provide 1875kcal/day (~27kcal/kg).  -Provide Boost Plus HP strawberry at 1500 per pt request (350kcal, 20g protein) -Monitor labs, weight and clinical course and adjust nutrition as clinically appropriate  NUTRITION DIAGNOSIS:  Severe Malnutrition related to chronic illness (Crohn's disease and malabsorption) as evidenced by percent weight loss, moderate fat depletion, severe muscle depletion.  GOAL:  Patient will meet greater than or equal to 90% of their needs  MONITOR:  Weight trends, PO intake, Supplement acceptance, Other (Comment) (TPN nutrient provision)  REASON FOR ASSESSMENT:  Consult New TPN/TNA  ASSESSMENT:  Pt is a 71yo M with PMH of Crohn's disease (chronic TPN), asthma, GERD, hiatal hernia, complex past surgical history including exploratory laparotomy with small bowel resection, lysis of adhesions, mesh explantation, diverting loop ileostomy April 2023, [facility] with failure to thrive and stoma retraction who presents with abdominal pain, n/v, SOB and chest tightness after receiving a 1L bolus of TPN over 1 hour.  Visited pt at bedside during breakfast. He reports following an "ostomy diet-no stringy vegetables or meats." Reports poor po intake and significant weight loss despite being maintained on TPN. Pt reports a significant 5% weight loss in the last week. Current TPN order reviewed and providing 1775kcal, 101g AA(23%kcal), 265g dextorse (GIR 2.63), and 46.8g lipids (26% kcal). Pt c/o urinary pain when TPN was started. No concern for UTI or yeast in urine per urinalysis. Could be r/t need to  urinate w/ bladder calculi. NFPE completed and reveals moderate fat loss, severe fat loss and increase hair loss. Pt meets ASPEN criteria for severe protein calorie malnutrition.   TPN resumed on 10/15. Recommend 5.6% caloric increase w/ 110g amino acids (1.57g/kg, 23%kcal), 275g dextrose (GIR 2.73, 50%kcal), and 50g lipids (27%kcal) to provide 1875kcal/day (~27kcal/kg). Discussed with pharmacy for possible adjustments tomorrow if pt remains admitted. Will also provide Strawberry Boost Plus HP q day (350kcal, 20g protein) per pt request.   Medications reviewed and include: TPN, novolog, psyllium, senokot-s, simethicone, prn tums, prn dilaudid, prn morphine  Labs reviewed: BG:96-124, corrected Ca:8.5 (ca:7.9, albumin:2.5), triglycerides 90   NUTRITION - FOCUSED PHYSICAL EXAM:  Flowsheet Row Most Recent Value  Orbital Region Moderate depletion  Upper Arm Region Moderate depletion  Thoracic and Lumbar Region Moderate depletion  Buccal Region Mild depletion  Temple Region Moderate depletion  Clavicle Bone Region Severe depletion  Clavicle and Acromion Bone Region Severe depletion  Scapular Bone Region Unable to assess  Dorsal Hand Mild depletion  Patellar Region Severe depletion  Anterior Thigh Region Severe depletion  Posterior Calf Region Mild depletion  Hair Reviewed  [falling out more than usual]  Eyes Reviewed  Mouth Reviewed  Skin Reviewed  Nails Reviewed       Diet Order:   Diet Order             Diet regular Room service appropriate? Yes; Fluid consistency: Thin  Diet effective now                   EDUCATION NEEDS:   No education needs have been identified at this time  Skin:  Skin Assessment: Reviewed RN Assessment  Last BM:  10/16 via ostomy  Height:  Ht Readings from Last 1 Encounters:  04/29/22 5' 10"  (1.778 m)    Weight:   Wt Readings from Last 1 Encounters:  05/01/22 70 kg    Ideal Body Weight:     BMI:  Body mass index is 22.14  kg/m.  Estimated Nutritional Needs:   Kcal:  2100-2450kcal (30-35kcal/kg)  Protein:  105-130g (1.5-1.8g/kg)  Fluid:  1750-2129m (25-324mkg)  KaCandise BowensMS, RD, LDN, CNSC See AMiON for contact information

## 2022-05-01 NOTE — NC FL2 (Signed)
[place] LEVEL OF CARE SCREENING TOOL     IDENTIFICATION  Patient Name: [name] Birthdate: 1950-10-29 Sex: male Admission Date (Current Location): 04/29/2022  [facility] and [place] Number:  Herbalist and Address:  [facility],  [place] [address], [place]      Provider Number:    Attending Physician Name and Address:  Eugenie Filler, MD  Relative Name and Phone Number:       Current Level of Care: Hospital Recommended Level of Care: [place] Prior Approval Number:    Date Approved/Denied:   PASRR Number: [id number]  Discharge Plan: SNF    Current Diagnoses: Patient Active Problem List   Diagnosis Date Noted   Chronic prostatitis 05/01/2022   Bladder calculi 05/01/2022   Nausea and vomiting    Dysuria    Hypoglycemia 04/29/2022   Hypotension 04/29/2022   Pelvic floor dysfunction 04/27/2022   Crohn's disease of small intestine with other complication ([place]) 40/34/7425   Protein-calorie malnutrition, severe 01/05/2022   Nausea 01/05/2022   AKI (acute kidney injury) ([place]) 01/03/2022   Leukocytosis 01/03/2022   Transaminitis 01/03/2022   Crohn's disease ([place]) 01/03/2022   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   Pacemaker 01/03/2022   Sepsis ([place]) 09/21/2021   Thrombocytopenia ([place]) 09/21/2021   Hypocalcemia 09/21/2021   Hypophosphatemia 09/21/2021   Hypokalemia 09/21/2021   External hemorrhoid, thrombosed 05/27/2019   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   Drug-induced acute pancreatitis without infection or necrosis - from 6 MP 09/24/2017   Long-term use of immunosuppressant medication  - Entyvio 06/28/2017   Crohn's ileitis ([place])  05/03/2017   Psoriasis 08/23/2016   Insomnia 04/22/2014   Enlarged thoracic aorta ([place]) 09/06/2012   SVT (supraventricular tachycardia) 08/04/2012   Vitamin D deficiency 06/18/2012   Attention deficit hyperactivity disorder (ADHD) 05/28/2009   BPH  (benign prostatic hyperplasia) 07/24/2007   Asthma 03/06/2007   GERD 03/06/2007   DIVERTICULITIS, HX OF 03/06/2007    Orientation RESPIRATION BLADDER Height & Weight     Self, Time, Situation, Place  Normal Continent Weight: 70 kg Height:  5' 10"  (177.8 cm)  BEHAVIORAL SYMPTOMS/MOOD NEUROLOGICAL BOWEL NUTRITION STATUS      Continent Diet, TNA (regular,liquid thin)  AMBULATORY STATUS COMMUNICATION OF NEEDS Skin   Extensive Assist Verbally Normal                       Personal Care Assistance Level of Assistance  Bathing, Feeding, Dressing Bathing Assistance: Limited assistance Feeding assistance: Limited assistance Dressing Assistance: Limited assistance     Functional Limitations Info  Sight, Hearing, Speech Sight Info: Adequate Hearing Info: Adequate Speech Info: Adequate    SPECIAL CARE FACTORS FREQUENCY  PT (By licensed PT), OT (By licensed OT)     PT Frequency: 5 x weekly OT Frequency: 5 x weekly            Contractures Contractures Info: Not present    Additional Factors Info  Code Status Code Status Info: full             Current Medications (05/01/2022):  This is the current hospital active medication list Current Facility-Administered Medications  Medication Dose Route Frequency Provider Last Rate Last Admin   0.9 %  sodium chloride infusion   Intravenous PRN Eugenie Filler, MD 10 mL/hr at 05/01/22 0700 Infusion Verify at 05/01/22 0700   acetaminophen (TYLENOL) tablet 650 mg  650 mg Oral Q6H PRN Irine Seal  V, MD       Or   acetaminophen (TYLENOL) suppository 650 mg  650 mg Rectal Q6H PRN Eugenie Filler, MD       albuterol (PROVENTIL) (2.5 MG/3ML) 0.083% nebulizer solution 2.5 mg  2.5 mg Nebulization Q6H PRN Eugenie Filler, MD       busPIRone (BUSPAR) tablet 5 mg  5 mg Oral TID Eugenie Filler, MD   5 mg at 04/30/22 2151   calcium carbonate (TUMS - dosed in mg elemental calcium) chewable tablet 500 mg  500 mg Oral TID PRN  Eugenie Filler, MD       Chlorhexidine Gluconate Cloth 2 % PADS 6 each  6 each Topical Daily Eugenie Filler, MD   6 each at 04/30/22 0812   enoxaparin (LOVENOX) injection 40 mg  40 mg Subcutaneous QHS Eugenie Filler, MD       EPINEPHrine (EPI-PEN) injection 0.3 mg  0.3 mg Intramuscular PRN Eugenie Filler, MD       HYDROmorphone (DILAUDID) injection 0.5-1 mg  0.5-1 mg Intravenous Q4H PRN Eugenie Filler, MD   1 mg at 05/01/22 0504   insulin aspart (novoLOG) injection 0-9 Units  0-9 Units Subcutaneous Q6H Adrian Saran, [facility]   1 Units at 04/30/22 2343   magnesium citrate solution 1 Bottle  1 Bottle Oral Once PRN Eugenie Filler, MD       melatonin tablet 3 mg  3 mg Oral QHS Eugenie Filler, MD   3 mg at 04/30/22 2151   morphine (MSIR) tablet 15 mg  15 mg Oral Q4H PRN Eugenie Filler, MD   15 mg at 05/01/22 0732   mupirocin ointment (BACTROBAN) 2 % 1 Application  1 Application Nasal BID Eugenie Filler, MD   1 Application at 24/09/73 2224   ondansetron (ZOFRAN) tablet 4 mg  4 mg Oral Q6H PRN Eugenie Filler, MD       Or   ondansetron [facility]) injection 4 mg  4 mg Intravenous Q6H PRN Eugenie Filler, MD   4 mg at 04/30/22 1740   Oral care mouth rinse  15 mL Mouth Rinse 4 times per day Eugenie Filler, MD   15 mL at 05/01/22 0732   polyethylene glycol (MIRALAX / GLYCOLAX) packet 17 g  17 g Oral Daily PRN Eugenie Filler, MD       psyllium (HYDROCIL/METAMUCIL) 1 packet  1 packet Oral Daily Eugenie Filler, MD   1 packet at 04/30/22 5329   senna-docusate (Senokot-S) tablet 1 tablet  1 tablet Oral BID Eugenie Filler, MD   1 tablet at 04/30/22 2151   simethicone (MYLICON) chewable tablet 80 mg  80 mg Oral TID WC & HS Eugenie Filler, MD   80 mg at 05/01/22 0732   sodium chloride flush (NS) 0.9 % injection 3 mL  3 mL Intravenous Q12H Eugenie Filler, MD   3 mL at 04/30/22 2153   sorbitol 70 % solution 30 mL  30 mL Oral Daily PRN Eugenie Filler, MD        sulfamethoxazole-trimethoprim (BACTRIM DS) 800-160 MG per tablet 1 tablet  1 tablet Oral Q12H Cleon Gustin, MD   1 tablet at 04/30/22 2151   tamsulosin (FLOMAX) capsule 0.4 mg  0.4 mg Oral QPC supper Cleon Gustin, MD   0.4 mg at 04/30/22 1716   TPN ADULT (ION)   Intravenous Continuous TPN Adrian Saran, RPH 65  mL/hr at 05/01/22 0700 Infusion Verify at 05/01/22 0700   TPN ADULT (ION)   Intravenous Continuous TPN Emiliano Dyer, RPH       traZODone (DESYREL) tablet 100 mg  100 mg Oral QHS Eugenie Filler, MD   100 mg at 04/30/22 2151     Discharge Medications: Please see discharge summary for a list of discharge medications.  Relevant Imaging Results:  Relevant Lab Results:   Additional Information DCV:[id number]  Leeroy Cha, RN

## 2022-05-02 DIAGNOSIS — K50918 Crohn's disease, unspecified, with other complication: Secondary | ICD-10-CM | POA: Diagnosis not present

## 2022-05-02 DIAGNOSIS — E162 Hypoglycemia, unspecified: Secondary | ICD-10-CM | POA: Diagnosis not present

## 2022-05-02 DIAGNOSIS — K219 Gastro-esophageal reflux disease without esophagitis: Secondary | ICD-10-CM | POA: Diagnosis not present

## 2022-05-02 LAB — CBC WITH DIFFERENTIAL/PLATELET
Abs Immature Granulocytes: 0 10*3/uL (ref 0.00–0.07)
Basophils Absolute: 0 10*3/uL (ref 0.0–0.1)
Basophils Relative: 1 %
Eosinophils Absolute: 0.2 10*3/uL (ref 0.0–0.5)
Eosinophils Relative: 6 %
HCT: 31 % — ABNORMAL LOW (ref 39.0–52.0)
Hemoglobin: 9.2 g/dL — ABNORMAL LOW (ref 13.0–17.0)
Immature Granulocytes: 0 %
Lymphocytes Relative: 40 %
Lymphs Abs: 1.6 10*3/uL (ref 0.7–4.0)
MCH: 24.2 pg — ABNORMAL LOW (ref 26.0–34.0)
MCHC: 29.7 g/dL — ABNORMAL LOW (ref 30.0–36.0)
MCV: 81.6 fL (ref 80.0–100.0)
Monocytes Absolute: 0.5 10*3/uL (ref 0.1–1.0)
Monocytes Relative: 12 %
Neutro Abs: 1.6 10*3/uL — ABNORMAL LOW (ref 1.7–7.7)
Neutrophils Relative %: 41 %
Platelets: 149 10*3/uL — ABNORMAL LOW (ref 150–400)
RBC: 3.8 MIL/uL — ABNORMAL LOW (ref 4.22–5.81)
RDW: 17.2 % — ABNORMAL HIGH (ref 11.5–15.5)
WBC: 3.8 10*3/uL — ABNORMAL LOW (ref 4.0–10.5)
nRBC: 0 % (ref 0.0–0.2)

## 2022-05-02 LAB — RENAL FUNCTION PANEL
Albumin: 2.7 g/dL — ABNORMAL LOW (ref 3.5–5.0)
Anion gap: 4 — ABNORMAL LOW (ref 5–15)
BUN: 15 mg/dL (ref 8–23)
CO2: 28 mmol/L (ref 22–32)
Calcium: 8.5 mg/dL — ABNORMAL LOW (ref 8.9–10.3)
Chloride: 105 mmol/L (ref 98–111)
Creatinine, Ser: 0.75 mg/dL (ref 0.61–1.24)
GFR, Estimated: >60 mL/min (ref >60)
Glucose, Bld: 101 mg/dL — ABNORMAL HIGH (ref 70–99)
Phosphorus: 3.6 mg/dL (ref 2.5–4.6)
Potassium: 4.2 mmol/L (ref 3.5–5.1)
Sodium: 137 mmol/L (ref 135–145)

## 2022-05-02 LAB — GLUCOSE, CAPILLARY
Glucose-Capillary: 106 mg/dL — ABNORMAL HIGH (ref 70–99)
Glucose-Capillary: 113 mg/dL — ABNORMAL HIGH (ref 70–99)
Glucose-Capillary: 129 mg/dL — ABNORMAL HIGH (ref 70–99)
Glucose-Capillary: 163 mg/dL — ABNORMAL HIGH (ref 70–99)

## 2022-05-02 MED ORDER — ORAL CARE MOUTH RINSE: 15.0000 mL | OROMUCOSAL | Status: DC | PRN

## 2022-05-02 MED ORDER — OXYBUTYNIN CHLORIDE 5 MG PO TABS
5.0000 mg | ORAL_TABLET | Freq: Three times a day (TID) | ORAL | Status: DC
  Administered 2022-05-02 – 2022-05-03 (×3): 5 mg via ORAL
  Filled 2022-05-02 (×3): qty 1

## 2022-05-02 MED ORDER — PANTOPRAZOLE SODIUM 40 MG PO TBEC
40.0000 mg | DELAYED_RELEASE_TABLET | Freq: Every day | ORAL | Status: DC
  Administered 2022-05-02 – 2022-05-03 (×2): 40 mg via ORAL
  Filled 2022-05-02 (×2): qty 1

## 2022-05-02 MED ORDER — TRACE MINERALS CU-MN-SE-ZN 300-55-60-3000 MCG/ML IV SOLN
INTRAVENOUS | Status: AC
  Filled 2022-05-02: qty 728

## 2022-05-02 MED ORDER — ALBUMIN HUMAN 25 % IV SOLN
25.0000 g | Freq: Two times a day (BID) | INTRAVENOUS | Status: DC
  Administered 2022-05-02 – 2022-05-03 (×3): 25 g via INTRAVENOUS
  Filled 2022-05-02 (×3): qty 100

## 2022-05-02 NOTE — Progress Notes (Signed)
PHARMACY - TOTAL PARENTERAL NUTRITION CONSULT NOTE   Indication: Crohn's disease and complex past surgical history including ex lap with small bowel resection, lysis of adhesion, mesh explantation, and diverting loop ileostomy in April 2023, on TPN prior to admission  Patient Measurements: Height: 5' 10"  (177.8 cm) Weight: 72 kg (158 lb 11.7 oz) IBW/kg (Calculated) : 73 TPN AdjBW (KG): 70.3 Body mass index is 22.78 kg/m.  Assessment: Patient presented with hypoglycemia/abdominal pain/nausea and vomiting. Staff from SNF reported pt set the TPN infusion to open rate causing current issues that presented to ER with. To continue TPN as inpatient, will get formula as close to outpatient formula as possible  Discharge from [place] 04/19/22:  [facility]. Their contact information is as follows: Name: [name], PharmD, ph: [phone], email: [email or web address].   Previous RD: Estimated Nutrient Requirements at [facility] Protein: 1.5 - 1.7 g/kg/day (98 - 104 g/day) Total kcal: 30 - 35 kcal/kg/day (1830 - 1983 kcal/day)   TPN Formulation   [A 6.6%, D 20%, F 3.1%] @ goal rate, 65 mL/hr (TV: 1560 mL/day) provides:  Protein 1.7 g/kg/day 103 g/day  Total kcal 32 kcal/kg/day 1971 kcal/day  NPC: N ratio 94   TPN Goal rate: 65 mL/hr (TV: 1560 mL/day)   Glucose / Insulin: No hx DM - most CBGs at goal < 150 - 2 units SSI required Electrolytes: all WNL including CorrCa Renal: BUN/SCr WNL, UOP 1757m/24hr Hepatic: LFTs WNL on 10/16, Albumin low 2.7; given 25g 10/15 Intake / Output; [facility] GI Imaging: - 10/14 CTa/p: no acute abnormalities; Evidence of previous partial right colon resection with ileocolonic anastomosis and left hemi colectomy with anastomosis between transverse and sigmoid colon. The surrounding inflammatory change of the right lower quadrant is not significantly changed compared to prior CT of April 27, 2022. GI Surgeries /  Procedures:   Central access: had prior to admission - PICC TPN start date: started PTA  Nutritional Goals: Goal TPN rate is 75 mL/hr (provides 109 g of protein and 1911 kcals per day) - 285g dextrose, 50g lipids  RD Assessment: (updated 10/16) Estimated Needs Total Energy Estimated Needs: 2100-2450kcal (30-35kcal/kg) Total Protein Estimated Needs: 105-130g (1.5-1.8g/kg) Total Fluid Estimated Needs: 1750-21055m(25-3017mg)  Current Nutrition:  Eating regular diet  Plan:  Adjust TPN formula and increase rate to 70 mL/hr at 1800 Electrolytes in TPN:  Na 73m39m,  K 73mE55m  Ca 5mEq/61m Mg 5mEq/L75mPhos 15mmol/8mCl:Ac 1:1 Add standard MVI and trace elements to TPN Sensitive q6h SSI and adjust as needed  Continue IVFs per Md Monitor TPN labs on Mon/Thurs and prn  Jorey Dollard WilPeggyann Juba, BCPS Pharmacy: [phone],7:07 AM

## 2022-05-02 NOTE — Progress Notes (Signed)
PROGRESS NOTE    [name]  GDJ:[id number] DOB: January 20, 1951 DOA: 04/29/2022 PCP: [name], MD    Chief Complaint  Patient presents with   Abdominal Pain    Brief Narrative:   [name] is a 71 y.o. male with medical history significant of Crohn's disease and complex past surgical history including exploratory laparotomy with small bowel resection, lysis of adhesions, mesh explantation, diverting loop ileostomy April 2023, recently [facility], [facility] with failure to thrive and stoma retraction.  Patient started on TPN during that hospitalization in addition to oral intake and discharged to skilled nursing facility.  Patient presenting back from facility with abdominal pain nausea vomiting some shortness of breath and chest tightness which started soon after he received a liter of TPN over 1 hour bolus by mistake.  Patient noted to supposedly be on TPN at 60 cc an hour.  Patient did state that after receiving the fluid he had diffuse abdominal pain, diffuse aches all over, nausea, dry heaves, vomiting, some shortness of breath, chest tightness which had improved at the time of my evaluation.  Patient denied any fevers, no diarrhea or constipation, no melena, no lip or tongue swelling, no difficulty swallowing.  Patient does endorse some lightheadedness, dizziness, dysuria.  Patient denies any penile discharge.  Patient does state ostomy output has been stable.  Chronic abdominal pain also stable.   ED Course: Patient seen in the ED, comprehensive metabolic profile done with a glucose of 45, creatinine of 0.84, potassium of 3.5, calcium of 8.7, albumin of 3.2, protein of 6.3 otherwise within normal limits.  Phosphorus level was 1.1, magnesium at 2.0.  Triglycerides at 84.  High-sensitivity troponin negative.  Lactic acid level at 1.8.  CBC pending.  Urinalysis pending.  Chest x-ray with no acute cardiopulmonary abnormalities.  CT  angiogram chest done negative for PE.  Minimal bilateral pleural effusions with minimal dependent atelectasis of the posterior lung bases.  CT abdomen and pelvis with no acute abnormalities identified, evidence of previous partial right colon resection with ileocolonic anastomosis and left hemicolectomy with anastomosis between transverse and sigmoid colon.  Surrounding inflammatory change of right lower quadrant is not significantly changed compared to prior CT of April 27, 2022.  Aortic atherosclerosis.  Patient noted to have hypotensive/soft blood pressures in the ED with systolics in the 62I with some response to IV fluids.  Patient with no wheezing and no significant shortness of breath and as such not given any IV steroids.  CBG initially noted at 30 and patient placed on a D10 infusion.   Assessment & Plan:   Principal Problem:   Hypoglycemia Active Problems:   Hypotension   Crohn's disease (HCC)   Hypophosphatemia   Attention deficit hyperactivity disorder (ADHD)   GERD   BPH (benign prostatic hyperplasia)   Ileostomy in place [facility])   Pacemaker   Protein-calorie malnutrition, severe   Nausea and vomiting   Dysuria   Chronic prostatitis   Bladder calculi  #1 hypoglycemia/abdominal pain/nausea and vomiting -Likely secondary to inadvertent TPN bolus given that SNF. -CBG noted at 30 and patient placed on a D10 IV fluids. -Patient with no signs or symptoms of infection.  Currently afebrile.  Normal white count.  CT angiogram chest, abdomen and pelvis negative for any acute abnormalities. -Improving clinically. -Urinalysis nitrite negative, leukocytes negative.   -Blood cultures pending with no growth to date. -Urine cultures negative. -D10 has been discontinued as  TPN has been started.   -Blood glucose of 163 this morning. -IV antiemetics, supportive care.   2.  Hypotension -Patient noted to be hypotensive on presentation to the ED, blood pressure seems to be somewhat  responding to IV fluids. -Patient with no signs or symptoms of infection.  -Blood pressure improving and somewhat soft this morning.  Afebrile. -Patient with no overt bleeding.  -Hemoglobin stable at 9.2. -Repeat blood pressure manually noted at 110/58 this morning. -Status post IV albumin x1 04/30/2022. -IV albumin every 12 hours x4 doses. -IV fluids saline lock. -Supportive care.   3.  Hypophosphatemia -Status post K-Phos 30 mmol IV x1. -Phosphorus at 3.6 today from 1.1 on admission. -On TPN. -Repeat labs in the AM.   4.  GERD -Continue PPI.    5.  Status post PPM -Stable.   6.  History of Crohn's disease with complex past surgical history including exploratory laparotomy with small bowel resection, lysis of adhesion, mesh explantation, diverting loop ileostomy April 2023 -Currently stable. -CT abdomen and pelvis done with no acute changes. -Ileostomy intact.  Stable output. -Saline lock IV fluids.  Supportive care.   -Outpatient follow-up with primary general surgeon and primary gastroenterologist.   7.  Severe protein calorie malnutrition -On TPN and regular diet orally prior to admission. -Due to accidental bolus of TPN given prior to presentation TPN held on admission and resumed yesterday evening, 04/30/2022 per pharmacy.  -Continue current regular diet orally.   -Supportive care.  8.  Dysuria/urinary pain/felt secondary to chronic prostatitis/BPH -Patient with complaints of dysuria/urinary pain ongoing for the past 2 to 3 weeks, states he has outpatient appointment with urology however is requesting that he be evaluated by urology during this hospitalization. -Urinalysis which was done nonconcerning for UTI. -Patient seen in consultation by urology who assessed patient and patient started on treatment for chronic prostatitis with Bactrim DS twice daily x28 days, patient also started on Flomax 0.4 mg daily for BPH.   -Outpatient follow-up with urology.  9.  Bladder  calculi -Urology discussed with patient management of bladder calculi including transurethral removal, it is noted per urology that patient would like to schedule this procedure with Dr. Diona Fanti in the outpatient setting.     DVT prophylaxis: Lovenox Code Status: Full Family Communication: Updated patient.  No family at bedside. Disposition: Transfer to telemetry.  Back to SNF once medically stable, electrolytes corrected, resolution of hypoglycemia.  Status is: Observation The patient remains OBS appropriate and will d/c before 2 midnights.   Consultants:  Urology: [name] 04/30/2022  Procedures:  CT angiogram chest 04/29/2022 CT abdomen and pelvis 04/29/2022 Chest x-ray 04/29/2022   Antimicrobials: Bactrim 04/30/2022>>>>   Subjective: Patient laying in bed.  Denies any chest pain.  No shortness of breath.  Denies any significant abdominal pain.  No nausea or vomiting.  Tolerating oral intake.  States urinary pain is slowly improving.  Blood pressure noted to be borderline/soft.  Repeat BP manually this afternoon 110/58.  Objective: Vitals:   05/02/22 0700 05/02/22 0800 05/02/22 0840 05/02/22 0901  BP: (!) 109/50  (!) 108/49 (!) 100/43  Pulse: 70  72 70  Resp: (!) 7  11 11   Temp:  97.6 F (36.4 C)    TempSrc:  Oral    SpO2: 95%  94% 94%  Weight:      Height:        Intake/Output Summary (Last 24 hours) at 05/02/2022 8144 Last data filed at 05/02/2022 0700 Gross per 24 hour  Intake 1786.86 ml  Output 1775 ml  Net 11.86 ml    Filed Weights   04/30/22 0950 05/01/22 0438 05/02/22 0500  Weight: 72.8 kg 70 kg 72 kg    Examination:  General exam: NAD. Respiratory system: CTA B. No wheezes, no crackles, no rhonchi.  Fair air movement.  Speaking in full sentences.  Cardiovascular system: RRR no murmurs rubs or gallops.  No JVD.  No lower extremity edema.  Gastrointestinal system: Abdomen is soft, nontender, nondistended, positive bowel sounds.  PEG tube  intact.  Ileostomy intact.  Central nervous system: Alert and oriented. No focal neurological deficits. Extremities: Symmetric 5 x 5 power. Skin: No rashes, lesions or ulcers Psychiatry: Judgement and insight appear normal. Mood & affect appropriate.     Data Reviewed: I have personally reviewed following labs and imaging studies  CBC: Recent Labs  Lab 04/27/22 1238 04/29/22 1619 04/30/22 0303 05/01/22 0447 05/02/22 0500  WBC 6.2 6.2 4.9 3.4* 3.8*  NEUTROABS 3.2 3.9 2.2 1.8 1.6*  HGB 10.0* 9.2* 9.1* 8.8* 9.2*  HCT 33.0* 30.0* 30.2* 29.7* 31.0*  MCV 80.7 80.2 80.3 81.6 81.6  PLT 175 154 144* 142* 149*     Basic Metabolic Panel: Recent Labs  Lab 04/27/22 1238 04/29/22 1229 04/29/22 1234 04/30/22 0303 05/01/22 0447 05/02/22 0500  NA 135 138  --  139 136 137  K 3.9 3.5  --  3.4* 3.7 4.2  CL 106 111  --  110 107 105  CO2 25 23  --  24 26 28   GLUCOSE 88 45*  --  102* 109* 101*  BUN 25* 22  --  13 9 15   CREATININE 0.80 0.84  --  0.77 0.87 0.75  CALCIUM 8.8* 8.7*  --  8.1* 7.9* 8.5*  MG  --   --  2.0 1.7 2.0  --   PHOS  --   --  1.1* 4.4 3.3 3.6     GFR: Estimated Creatinine Clearance: 88.8 mL/min (by C-G formula based on SCr of 0.75 mg/dL).  Liver Function Tests: Recent Labs  Lab 04/27/22 1238 04/29/22 1229 04/30/22 0303 05/01/22 0447 05/02/22 0500  AST 26 34 18 21  --   ALT 18 16 14 13   --   ALKPHOS 73 70 57 55  --   BILITOT 0.5 0.7 0.5 0.4  --   PROT 6.5 6.3* 5.5* 5.5*  --   ALBUMIN 3.2* 3.2* 2.7* 2.8* 2.7*     CBG: Recent Labs  Lab 05/01/22 0543 05/01/22 1226 05/01/22 1828 05/01/22 2337 05/02/22 0547  GLUCAP 99 106* 110* 109* 163*      Recent Results (from the past 240 hour(s))  Urine Culture     Status: None   Collection Time: 04/29/22  4:42 PM   Specimen: Urine, Clean Catch  Result Value Ref Range Status   Specimen Description   Final    URINE, CLEAN CATCH Performed at [facility], [place] [address]  [place], [place] [address]    Special Requests   Final    NONE Performed at [facility], [place] [address] [place], [place] [address]    Culture   Final    NO GROWTH Performed at [facility], [place] [address] [place], [place] [address]    Report Status 04/30/2022 FINAL  Final  MRSA Next Gen by PCR, Nasal     Status: Abnormal   Collection Time: 04/29/22  5:16 PM   Specimen: Nasal Mucosa; Nasal Swab  Result Value Ref Range Status   MRSA by PCR Next Gen DETECTED (A) NOT DETECTED Final    Comment: CRITICAL RESULT CALLED TO, READ BACK BY AND VERIFIED WITH: DOSTER,S AT 2117 ON 04/29/22 BY LUZOLOP (NOTE) The GeneXpert MRSA Assay (FDA approved for NASAL specimens only), is one component of a comprehensive MRSA colonization surveillance program. It is not intended to diagnose MRSA infection nor to guide or monitor treatment for MRSA infections. Test performance is not FDA approved in patients less than 39 years old. Performed at [facility], [place] [address], [place], [place] [address]          Radiology Studies: No results found.      Scheduled Meds:  busPIRone  5 mg Oral TID   Chlorhexidine Gluconate Cloth  6 each Topical Daily   enoxaparin (LOVENOX) injection  40 mg Subcutaneous QHS   feeding supplement  237 mL Oral Q24H   insulin aspart  0-9 Units Subcutaneous Q6H   melatonin  3 mg Oral QHS   mupirocin ointment  1 Application Nasal BID   mouth rinse  15 mL Mouth Rinse 4 times per day   psyllium  1 packet Oral Daily   senna-docusate  1 tablet Oral BID   simethicone  80 mg Oral TID WC & HS   sodium chloride flush  3 mL Intravenous Q12H   sulfamethoxazole-trimethoprim  1 tablet Oral Q12H   tamsulosin  0.4 mg Oral QPC supper   traZODone  100 mg Oral QHS   Continuous Infusions:  sodium chloride 5 mL/hr at 05/02/22 0700   TPN ADULT (ION) 65 mL/hr at 05/02/22 0700   TPN ADULT (ION)       LOS: 2 days    Time spent: 35  minutes    Irine Seal, MD [facility]   To contact the attending provider between 7A-7P or the covering provider during after hours 7P-7A, please log into the web site [email or web address] and access using universal [place] password for that web site. If you do not have the password, please call the hospital operator.  05/02/2022, 9:52 AM

## 2022-05-02 NOTE — Progress Notes (Signed)
Physical Therapy Treatment Patient Details Name: [name] MRN: [id number] DOB: [birth year] Today's Date: 05/02/2022   History of Present Illness Patient is a 71 year old male who presented from short term rehab with abdomina pain with nausea and vomitting s/p receiving 1L of TPN over 1 hour by mistake. patient was found to have hypoglycemia, and hypotension in ED resulting in admission to ICU. PMH: Crohn's disease,exploratory laparotomy with small bowel resection, lysis of adhesions, mesh explantation, diverting loop ileostomy April 2023 and recent admission with stoma retraction and failure to thrive 04/09/22    PT Comments    Pt assisted into bathroom and then ambulated in hallway.  Pt currently min/guard-min assist with min assist for challenges to balance.  Pt plans to return to SNF upon d/c.    Recommendations for follow up therapy are one component of a multi-disciplinary discharge planning process, led by the attending physician.  Recommendations may be updated based on patient status, additional functional criteria and insurance authorization.  Follow Up Recommendations  Skilled nursing-short term rehab (<3 hours/day) Can patient physically be transported by private vehicle: Yes   Assistance Recommended at Discharge Set up Supervision/Assistance  Patient can return home with the following A little help with walking and/or transfers;A little help with bathing/dressing/bathroom;Assist for transportation;Help with stairs or ramp for entrance   Equipment Recommendations  None recommended by PT    Recommendations for Other Services       Precautions / Restrictions Precautions Precautions: Fall     Mobility  Bed Mobility Overal bed mobility: Needs Assistance Bed Mobility: Supine to Sit, Sit to Supine     Supine to sit: Supervision Sit to supine: Supervision        Transfers Overall transfer level: Needs assistance Equipment used: Rolling walker (2  wheels) Transfers: Sit to/from Stand Sit to Stand: Min guard           General transfer comment: min/guard for safety    Ambulation/Gait Ambulation/Gait assistance: Min guard, Min assist Gait Distance (Feet): 180 Feet Assistive device: Rolling walker (2 wheels) Gait Pattern/deviations: Step-through pattern, Trunk flexed, Decreased stride length Gait velocity: decr     General Gait Details: cues for RW positioning and posture; min assist for occasional stability with challenges upon starting (going into bathroom, anything involving a hand lifted from RW) then min/guard near end of ambulation with Bil UE supported on RW   Stairs             Wheelchair Mobility    Modified Rankin (Stroke Patients Only)       Balance Overall balance assessment: Needs assistance         Standing balance support: Bilateral upper extremity supported, Reliant on assistive device for balance Standing balance-Leahy Scale: Poor                              Cognition Arousal/Alertness: Awake/alert Behavior During Therapy: WFL for tasks assessed/performed Overall Cognitive Status: Within Functional Limits for tasks assessed                                          Exercises      General Comments        Pertinent Vitals/Pain Pain Assessment Pain Assessment: No/denies pain    Home Living  Prior Function            PT Goals (current goals can now be found in the care plan section) Progress towards PT goals: Progressing toward goals    Frequency    Min 2X/week      PT Plan Current plan remains appropriate    Co-evaluation              AM-PAC PT "6 Clicks" Mobility   Outcome Measure  Help needed turning from your back to your side while in a flat bed without using bedrails?: None Help needed moving from lying on your back to sitting on the side of a flat bed without using bedrails?: A  Little Help needed moving to and from a bed to a chair (including a wheelchair)?: A Little Help needed standing up from a chair using your arms (e.g., wheelchair or bedside chair)?: A Little Help needed to walk in hospital room?: A Little Help needed climbing 3-5 steps with a railing? : A Lot 6 Click Score: 18    End of Session Equipment Utilized During Treatment:  (declined use of gait belt) Activity Tolerance: Patient tolerated treatment well Patient left: in bed;with call bell/phone within reach Nurse Communication: Mobility status PT Visit Diagnosis: Difficulty in walking, not elsewhere classified (R26.2)     Time: 0931-1216 PT Time Calculation (min) (ACUTE ONLY): 21 min  Charges:  $Gait Training: 8-22 mins                    Arlyce Dice, DPT Physical Therapist Acute Rehabilitation Services Preferred contact method: Secure Chat Weekend Pager Only: [phone] Office: [place] 05/02/2022, 3:14 PM

## 2022-05-03 LAB — CBC
HCT: 30.2 % — ABNORMAL LOW (ref 39.0–52.0)
Hemoglobin: 9.1 g/dL — ABNORMAL LOW (ref 13.0–17.0)
MCH: 24.3 pg — ABNORMAL LOW (ref 26.0–34.0)
MCHC: 30.1 g/dL (ref 30.0–36.0)
MCV: 80.7 fL (ref 80.0–100.0)
Platelets: 144 10*3/uL — ABNORMAL LOW (ref 150–400)
RBC: 3.74 MIL/uL — ABNORMAL LOW (ref 4.22–5.81)
RDW: 17.2 % — ABNORMAL HIGH (ref 11.5–15.5)
WBC: 4.1 10*3/uL (ref 4.0–10.5)
nRBC: 0 % (ref 0.0–0.2)

## 2022-05-03 LAB — COMPREHENSIVE METABOLIC PANEL
ALT: 11 U/L (ref 0–44)
AST: 17 U/L (ref 15–41)
Albumin: 3.7 g/dL (ref 3.5–5.0)
Alkaline Phosphatase: 49 U/L (ref 38–126)
Anion gap: 3 — ABNORMAL LOW (ref 5–15)
BUN: 19 mg/dL (ref 8–23)
CO2: 29 mmol/L (ref 22–32)
Calcium: 8.7 mg/dL — ABNORMAL LOW (ref 8.9–10.3)
Chloride: 104 mmol/L (ref 98–111)
Creatinine, Ser: 0.94 mg/dL (ref 0.61–1.24)
GFR, Estimated: >60 mL/min (ref >60)
Glucose, Bld: 100 mg/dL — ABNORMAL HIGH (ref 70–99)
Potassium: 3.9 mmol/L (ref 3.5–5.1)
Sodium: 136 mmol/L (ref 135–145)
Total Bilirubin: 0.4 mg/dL (ref 0.3–1.2)
Total Protein: 6.3 g/dL — ABNORMAL LOW (ref 6.5–8.1)

## 2022-05-03 LAB — MAGNESIUM: Magnesium: 2.2 mg/dL (ref 1.7–2.4)

## 2022-05-03 LAB — GLUCOSE, CAPILLARY
Glucose-Capillary: 96 mg/dL (ref 70–99)
Glucose-Capillary: 107 mg/dL — ABNORMAL HIGH (ref 70–99)

## 2022-05-03 LAB — PHOSPHORUS: Phosphorus: 3.6 mg/dL (ref 2.5–4.6)

## 2022-05-03 MED ORDER — TRAVASOL 10 % IV SOLN
INTRAVENOUS | Status: DC
  Filled 2022-05-03: qty 1132.8

## 2022-05-03 MED ORDER — SULFAMETHOXAZOLE-TRIMETHOPRIM 800-160 MG PO TABS: 1.0000 | ORAL_TABLET | Freq: Two times a day (BID) | ORAL | 0 refills | Status: DC

## 2022-05-03 MED ORDER — ADULT MULTIVITAMIN W/MINERALS CH
1.0000 | ORAL_TABLET | Freq: Every day | ORAL | Status: DC
  Administered 2022-05-03: 1 via ORAL
  Filled 2022-05-03: qty 1

## 2022-05-03 MED ORDER — TAMSULOSIN HCL 0.4 MG PO CAPS: 0.4000 mg | ORAL_CAPSULE | Freq: Every day | ORAL | 2 refills | Status: DC

## 2022-05-03 MED ORDER — OXYBUTYNIN CHLORIDE 5 MG PO TABS: 5.0000 mg | ORAL_TABLET | Freq: Three times a day (TID) | ORAL | 2 refills | Status: DC

## 2022-05-03 NOTE — Assessment & Plan Note (Deleted)
   Likely secondary to inadvertent TPN bolus given that SNF.  -CBG noted at 30 and patient placed on a D10 IV fluids. -Patient with no signs or symptoms of infection. Currently afebrile. Normal white count. CT angiogram chest, abdomen and pelvis negative for any acute abnormalities. -Improving clinically. -Urinalysis nitrite negative, leukocytes negative.   -Blood cultures pending with no growth to date. -Urine cultures negative. -D10 has been discontinued as TPN has been started.   -Blood glucose of 163 this morning. -IV antiemetics, supportive care.

## 2022-05-03 NOTE — Hospital Course (Addendum)
Patient with history of Crohn's disease status post complex surgical history being followed at [facility] status post ileostomy recently discharged from [facility] September 2023 to SNF presenting back with nausea vomiting abdominal pain after accidentally receiving TPN as a bolus.    On arrival to [facility], patient was noted to be hypoglycemic with hypotension.  Patient had been placed on a D10 infusion to stabilize bouts of hypoglycemia and placed the stepdown unit.  Patient was admitted for observation to follow-up on electrolytes, triglycerides levels, blood glucose levels. Patient noted to have some soft blood pressure with low diastolics and placed on scheduled IV albumin.  Seen by urology as well for bladder spasm secondary to bladder stones as well as BPH and was prescribed Ditropan as well as Flomax per their recommendations.  Patient clinically improved with resolving hypoglycemia and stable electrolytes.  Nausea and vomiting had additionally resolved.  Arrangements were made for the patient to be discharged back to his skilled nursing facility on his previous regimen of TPN.  Patient was additionally instructed to follow-up with both his GI/colorectal specialist at [facility] as well as [facility] for management of his bladder stones and BPH.

## 2022-05-03 NOTE — Progress Notes (Signed)
Occupational Therapy Treatment Patient Details Name: [name] MRN: [id number] DOB: [birth year] Today's Date: 05/03/2022   History of present illness Patient is a 71 year old male who presented from short term rehab with abdomina pain with nausea and vomitting s/p receiving 1L of TPN over 1 hour by mistake. patient was found to have hypoglycemia, and hypotension in ED resulting in admission to ICU. PMH: Crohn's disease,exploratory laparotomy with small bowel resection, lysis of adhesions, mesh explantation, diverting loop ileostomy April 2023 and recent admission with stoma retraction and failure to thrive 04/09/22   OT comments  Patient was able to participate in grooming and toileting hygiene in standing with min guard with RW with increased time and some noted unsteadiness with no UE support but no LOB. Patient was educated on importance of getting out of bed each day. Patient verbalized understanding and agreed to sit up in recliner. Patient would continue to benefit from skilled OT services at this time while admitted and after d/c to address noted deficits in order to improve overall safety and independence in ADLs.     Recommendations for follow up therapy are one component of a multi-disciplinary discharge planning process, led by the attending physician.  Recommendations may be updated based on patient status, additional functional criteria and insurance authorization.    Follow Up Recommendations  Skilled nursing-short term rehab (<3 hours/day)    Assistance Recommended at Discharge Frequent or constant Supervision/Assistance  Patient can return home with the following  A little help with walking and/or transfers;A little help with bathing/dressing/bathroom;Direct supervision/assist for financial management;Assistance with cooking/housework;Assist for transportation;Help with stairs or ramp for entrance;Direct supervision/assist for medications management   Equipment  Recommendations  None recommended by OT    Recommendations for Other Services      Precautions / Restrictions Precautions Precautions: Fall Precaution Comments: monitor BP Restrictions Weight Bearing Restrictions: No       Mobility Bed Mobility Overal bed mobility: Needs Assistance Bed Mobility: Supine to Sit, Sit to Supine     Supine to sit: Supervision          Transfers                         Balance                                           ADL either performed or assessed with clinical judgement   ADL Overall ADL's : Needs assistance/impaired     Grooming: Wash/dry hands;Wash/dry face;Standing;Min guard Grooming Details (indicate cue type and reason): at sink with RW             Lower Body Dressing: Bed level;Set up Lower Body Dressing Details (indicate cue type and reason): donning bilateral socks with increased time Toilet Transfer: Supervision/safety;Ambulation;Rolling walker (2 wheels);Min guard Toilet Transfer Details (indicate cue type and reason): standing at commode to void blader with min guard when no BUE support in provided. Toileting- Clothing Manipulation and Hygiene: Sit to/from stand;Min guard       Functional mobility during ADLs: Min guard;Rolling walker (2 wheels) General ADL Comments: functional mobility in hallway with RW with noted distraction looking into rooms with cues to maintain straight in hallway    Extremity/Trunk Assessment              Vision       Perception  Praxis      Cognition                                                Exercises      Shoulder Instructions       General Comments      Pertinent Vitals/ Pain       Pain Assessment Pain Assessment: No/denies pain  Home Living                                          Prior Functioning/Environment              Frequency  Min 1X/week        Progress Toward  Goals  OT Goals(current goals can now be found in the care plan section)  Progress towards OT goals: Progressing toward goals     Plan Discharge plan remains appropriate    Co-evaluation                 AM-PAC OT "6 Clicks" Daily Activity     Outcome Measure   Help from another person eating meals?: A Little Help from another person taking care of personal grooming?: A Little Help from another person toileting, which includes using toliet, bedpan, or urinal?: A Little Help from another person bathing (including washing, rinsing, drying)?: A Little Help from another person to put on and taking off regular upper body clothing?: A Little Help from another person to put on and taking off regular lower body clothing?: A Little 6 Click Score: 18    End of Session Equipment Utilized During Treatment: Rolling walker (2 wheels)  OT Visit Diagnosis: Unsteadiness on feet (R26.81);Other abnormalities of gait and mobility (R26.89);Muscle weakness (generalized) (M62.81)   Activity Tolerance Patient tolerated treatment well   Patient Left in chair;with call bell/phone within reach;with chair alarm set   Nurse Communication Mobility status        Time: 9507-2257 OT Time Calculation (min): 17 min  Charges: OT General Charges $OT Visit: 1 Visit OT Treatments $Self Care/Home Management : 8-22 mins  Justin Plowman, MS Acute Rehabilitation Department Office# [phone]   [name] 05/03/2022, 11:21 AM

## 2022-05-03 NOTE — Discharge Summary (Signed)
Physician Discharge Summary   Patient: [name] MRN: [id number] DOB: 07-Dec-1950  Admit date:     04/29/2022  Discharge date: 05/03/22  Discharge Physician: Vernelle Emerald   PCP: [name], MD   Recommendations at discharge:   Please follow-up with your primary care provider Dr. [name] in 1 to 2 weeks Please follow-up with [name] at [facility] at your previously scheduled appointment time Please follow-up with Dr. Frann Rider with [facility] in 2 to 4 weeks or first available appointment for your diagnosed bladder stones and enlarged prostate Continue TPN regimen as previously prescribed Oral intake as tolerated  Discharge Diagnoses: Principal Problem:   Hypoglycemia Active Problems:   Hypotension   Crohn's disease ([place])   Hypophosphatemia   Attention deficit hyperactivity disorder (ADHD)   GERD   BPH (benign prostatic hyperplasia)   Ileostomy in place [facility])   Pacemaker   Protein-calorie malnutrition, severe   Nausea and vomiting   Dysuria   Chronic prostatitis   Bladder calculi  Resolved Problems:   * No resolved hospital problems. [facility] Course: Patient with history of Crohn's disease status post complex surgical history [facility] from [facility] to SNF presenting back with nausea vomiting abdominal pain after accidentally receiving TPN as a bolus.    On arrival to [facility] long hospital, patient was noted to be hypoglycemic with hypotension.  Patient had been placed on a D10 infusion to stabilize bouts of hypoglycemia and placed the stepdown unit.  Patient was admitted for observation to follow-up on electrolytes, triglycerides levels, blood glucose levels. Patient noted to have some soft blood pressure with low diastolics and placed on scheduled IV albumin.  Seen by urology as well for bladder spasm secondary to bladder stones as well as BPH and was prescribed  Ditropan as well as Flomax per their recommendations.  Patient clinically improved with resolving hypoglycemia and stable electrolytes.  Nausea and vomiting had additionally resolved.  Arrangements were made for the patient to be discharged back to his skilled nursing facility on his previous regimen of TPN.  Patient was additionally instructed to follow-up with both his GI/colorectal specialist at [facility] as well as [facility] for management of his bladder stones and BPH.      Pain control - [employer] Controlled Substance Reporting System database was reviewed. and patient was instructed, not to drive, operate heavy machinery, perform activities at heights, swimming or participation in water activities or provide baby-sitting services while on Pain, Sleep and Anxiety Medications; until their outpatient Physician has advised to do so again. Also recommended to not to take more than prescribed Pain, Sleep and Anxiety Medications.  Consultants: [name] with Urology Procedures performed: None  Disposition: Skilled nursing facility Diet recommendation:  Continue TPN with PO intake (regular diet) as tolerated  DISCHARGE MEDICATION: Allergies as of 05/03/2022       Reactions   Mercaptopurine Other (See Comments)   Pancreatitis   Shellfish Allergy Anaphylaxis   Throat started to close   Humira [adalimumab]    Developed antibodies   Wound Dressing Adhesive Rash        Medication List     STOP taking these medications    amoxicillin-clavulanate 400-57 MG chewable tablet Commonly known as: AUGMENTIN   diazepam 10 MG tablet Commonly known as: Valium   Entyvio 300 MG injection Generic drug: vedolizumab   EPINEPHrine 0.3 mg/0.3 mL Soaj injection  Commonly known as: EpiPen 2-Pak   halobetasol 0.05 % ointment Commonly known as: ULTRAVATE   hydrocortisone 25 MG suppository Commonly known as: ANUSOL-HC   ketoconazole 2 % cream Commonly known as:  NIZORAL   loperamide 2 MG capsule Commonly known as: IMODIUM   losartan 25 MG tablet Commonly known as: COZAAR   mirtazapine 15 MG tablet Commonly known as: Remeron   Oxycodone HCl 10 MG Tabs   pantoprazole 40 MG tablet Commonly known as: PROTONIX   promethazine 25 MG tablet Commonly known as: PHENERGAN   scopolamine 1 MG/3DAYS Commonly known as: TRANSDERM-SCOP       TAKE these medications    acetaminophen 325 MG tablet Commonly known as: TYLENOL Take 650 mg by mouth every 6 (six) hours as needed for moderate pain.   albuterol 108 (90 Base) MCG/ACT inhaler Commonly known as: VENTOLIN HFA INHALE 2 PUFFS INTO THE LUNGS EVERY 4 (FOUR) HOURS AS NEEDED FOR WHEEZING OR SHORTNESS OF BREATH. What changed: when to take this   busPIRone 5 MG tablet Commonly known as: BUSPAR Take 5 mg by mouth 3 (three) times daily.   calcium carbonate 500 MG chewable tablet Commonly known as: TUMS - dosed in mg elemental calcium Chew 500 mg by mouth 3 (three) times daily as needed for indigestion or heartburn.   CLINIMIX E/DEXTROSE (5/15) IV Inject 65 mL/hr into the vein continuous. Via powerline TPN IV (104gm aa/183 GM DEC/50GM)   melatonin 3 MG Tabs tablet Take 3 mg by mouth at bedtime.   morphine 15 MG tablet Commonly known as: MSIR Take 15 mg by mouth every 4 (four) hours as needed for severe pain.   multivitamin with minerals Tabs tablet Take 1 tablet by mouth daily.   omeprazole 20 MG tablet Commonly known as: PRILOSEC OTC Take 20 mg by mouth daily.   ondansetron 4 MG disintegrating tablet Commonly known as: ZOFRAN-ODT Take 4 mg by mouth every 6 (six) hours as needed for nausea or vomiting.   OVER THE COUNTER MEDICATION Take 1 Bottle by mouth in the morning and at bedtime. Medpass 184m supplement   oxybutynin 5 MG tablet Commonly known as: DITROPAN Take 1 tablet (5 mg total) by mouth 3 (three) times daily.   psyllium 95 % Pack Commonly known as:  HYDROCIL/METAMUCIL Take 1 packet by mouth daily.   simethicone 80 MG chewable tablet Commonly known as: MYLICON Chew 80 mg by mouth 4 (four) times daily -  with meals and at bedtime.   sulfamethoxazole-trimethoprim 800-160 MG tablet Commonly known as: BACTRIM DS Take 1 tablet by mouth every 12 (twelve) hours for 25 days.   tamsulosin 0.4 MG Caps capsule Commonly known as: FLOMAX Take 1 capsule (0.4 mg total) by mouth daily after supper.   traZODone 50 MG tablet Commonly known as: DESYREL Take 100 mg by mouth at bedtime.        Follow-up Information     FLaurey Morale MD. Schedule an appointment as soon as possible for a visit in 1 week(s).   Specialty: Family Medicine Why: Make a follow-up appointment with Dr. FSarajane Jewsyour primary care provider in 1 to 2 weeks. Contact information: [phone]         ARenelda Mom MD. Go to.   Specialty: General Surgery Why: [facility] Contact information: [place] [phone]  Dr. Frann Rider. Schedule an appointment as soon as possible for a visit.   Why: Please call and make an appointment with Dr. Frann Rider wtih Urology for your bladder stones and enlarged prostate Contact information: [facility] [place], [place] [address] Phone # [phone]               Discharge Exam: Danley Danker Weights   04/30/22 0950 05/01/22 0438 05/02/22 0500  Weight: 72.8 kg 70 kg 72 kg   Constitutional: Awake alert and oriented x3, no associated distress.   Respiratory: clear to auscultation bilaterally, no wheezing, no crackles. Normal respiratory effort. No accessory muscle use.  Cardiovascular: Regular rate and rhythm, no murmurs / rubs / gallops. No extremity edema. 2+ pedal pulses. No carotid bruits.  Abdomen: Ostomy bag in place draining watery brown  stool.  Abdomen is soft and nontender.  No evidence of intra-abdominal masses.  Positive bowel sounds noted in all quadrants.   Musculoskeletal: No joint deformity upper and lower extremities. Good ROM, no contractures. Normal muscle tone.     Condition at discharge: fair  The results of significant diagnostics from this hospitalization (including imaging, microbiology, ancillary and laboratory) are listed below for reference.   Imaging Studies: CT Angio Chest PE W and/or Wo Contrast  Result Date: 04/29/2022 CLINICAL DATA:  Positive D-dimers. Assess for pulmonary embolus. Acute nonlocalized abdomen pain. EXAM: CT ANGIOGRAPHY CHEST CT ABDOMEN AND PELVIS WITH CONTRAST TECHNIQUE: Multidetector CT imaging of the chest was performed using the standard protocol during bolus administration of intravenous contrast. Multiplanar CT image reconstructions and MIPs were obtained to evaluate the vascular anatomy. Multidetector CT imaging of the abdomen and pelvis was performed using the standard protocol during bolus administration of intravenous contrast. RADIATION DOSE REDUCTION: This exam was performed according to the departmental dose-optimization program which includes automated exposure control, adjustment of the mA and/or kV according to patient size and/or use of iterative reconstruction technique. CONTRAST:  156m OMNIPAQUE IOHEXOL 350 MG/ML SOLN COMPARISON:  CT abdomen and pelvis April 27, 2022, abdomen pelvic CT September 06, 2018 FINDINGS: CTA CHEST FINDINGS Cardiovascular: Satisfactory opacification of the pulmonary arteries to the segmental level. No evidence of pulmonary embolism. Normal heart size. No pericardial effusion. Mediastinum/Nodes: No enlarged mediastinal, hilar, or axillary lymph nodes. Thyroid gland, trachea, and esophagus demonstrate no significant findings. Lungs/Pleura: There is no focal pneumonia or pulmonary edema. Minimal bilateral pleural effusions are noted. Stable lateral right  lower lobe subpleural scar is unchanged compared to prior exams. Minimal dependent atelectasis of posterior lung bases are noted. Musculoskeletal: Degenerative joint changes of the spine are noted. Review of the MIP images confirms the above findings. CT ABDOMEN and PELVIS FINDINGS Hepatobiliary: No focal liver abnormality is seen. No gallstones, gallbladder wall thickening, or biliary dilatation. Pancreas: Unremarkable. No pancreatic ductal dilatation or surrounding inflammatory changes. Spleen: Normal in size without focal abnormality. Adrenals/Urinary Tract: The bilateral adrenal glands are normal. No renal calculi is identified. No focal kidney lesion is noted. No hydronephrosis bilaterally. Dependent bladder calculi identified in a partially decompressed bladder. Stomach/Bowel: A diverting ileostomy is identified in the right lower quadrant. There is evidence of previous partial right colon resection with ileocolonic anastomosis and left hemi colectomy with anastomosis between transverse and sigmoid colon. The surrounding inflammatory change of the right lower quadrant is not significantly changed compared to prior CT of April 27, 2022. the stomach is normal. No free air is identified. Vascular/Lymphatic: Aortic atherosclerosis. No enlarged abdominal or pelvic lymph nodes. Reproductive: Calcifications identified  within the prostate gland measuring 5 cm in diameter. Other: Stable postsurgical changes in the anterior abdominal wall compared to prior exam of April 27, 2022. Musculoskeletal: No acute abnormality. Review of the MIP images confirms the above findings. IMPRESSION: 1. No pulmonary embolus. 2. Minimal bilateral pleural effusions with minimal dependent atelectasis of posterior lung bases. 3. No acute abnormality identified in the abdomen and pelvis. 4. Evidence of previous partial right colon resection with ileocolonic anastomosis and left hemi colectomy with anastomosis between transverse and  sigmoid colon. The surrounding inflammatory change of the right lower quadrant is not significantly changed compared to prior CT of April 27, 2022. 5. Aortic atherosclerosis. Aortic Atherosclerosis (ICD10-I70.0). Electronically Signed   By: Abelardo Diesel M.D.   On: 04/29/2022 14:45   CT ABDOMEN PELVIS W CONTRAST  Result Date: 04/29/2022 CLINICAL DATA:  Positive D-dimers. Assess for pulmonary embolus. Acute nonlocalized abdomen pain. EXAM: CT ANGIOGRAPHY CHEST CT ABDOMEN AND PELVIS WITH CONTRAST TECHNIQUE: Multidetector CT imaging of the chest was performed using the standard protocol during bolus administration of intravenous contrast. Multiplanar CT image reconstructions and MIPs were obtained to evaluate the vascular anatomy. Multidetector CT imaging of the abdomen and pelvis was performed using the standard protocol during bolus administration of intravenous contrast. RADIATION DOSE REDUCTION: This exam was performed according to the departmental dose-optimization program which includes automated exposure control, adjustment of the mA and/or kV according to patient size and/or use of iterative reconstruction technique. CONTRAST:  138m OMNIPAQUE IOHEXOL 350 MG/ML SOLN COMPARISON:  CT abdomen and pelvis April 27, 2022, abdomen pelvic CT September 06, 2018 FINDINGS: CTA CHEST FINDINGS Cardiovascular: Satisfactory opacification of the pulmonary arteries to the segmental level. No evidence of pulmonary embolism. Normal heart size. No pericardial effusion. Mediastinum/Nodes: No enlarged mediastinal, hilar, or axillary lymph nodes. Thyroid gland, trachea, and esophagus demonstrate no significant findings. Lungs/Pleura: There is no focal pneumonia or pulmonary edema. Minimal bilateral pleural effusions are noted. Stable lateral right lower lobe subpleural scar is unchanged compared to prior exams. Minimal dependent atelectasis of posterior lung bases are noted. Musculoskeletal: Degenerative joint changes of the  spine are noted. Review of the MIP images confirms the above findings. CT ABDOMEN and PELVIS FINDINGS Hepatobiliary: No focal liver abnormality is seen. No gallstones, gallbladder wall thickening, or biliary dilatation. Pancreas: Unremarkable. No pancreatic ductal dilatation or surrounding inflammatory changes. Spleen: Normal in size without focal abnormality. Adrenals/Urinary Tract: The bilateral adrenal glands are normal. No renal calculi is identified. No focal kidney lesion is noted. No hydronephrosis bilaterally. Dependent bladder calculi identified in a partially decompressed bladder. Stomach/Bowel: A diverting ileostomy is identified in the right lower quadrant. There is evidence of previous partial right colon resection with ileocolonic anastomosis and left hemi colectomy with anastomosis between transverse and sigmoid colon. The surrounding inflammatory change of the right lower quadrant is not significantly changed compared to prior CT of April 27, 2022. the stomach is normal. No free air is identified. Vascular/Lymphatic: Aortic atherosclerosis. No enlarged abdominal or pelvic lymph nodes. Reproductive: Calcifications identified within the prostate gland measuring 5 cm in diameter. Other: Stable postsurgical changes in the anterior abdominal wall compared to prior exam of April 27, 2022. Musculoskeletal: No acute abnormality. Review of the MIP images confirms the above findings. IMPRESSION: 1. No pulmonary embolus. 2. Minimal bilateral pleural effusions with minimal dependent atelectasis of posterior lung bases. 3. No acute abnormality identified in the abdomen and pelvis. 4. Evidence of previous partial right colon resection with ileocolonic anastomosis and left hemi  colectomy with anastomosis between transverse and sigmoid colon. The surrounding inflammatory change of the right lower quadrant is not significantly changed compared to prior CT of April 27, 2022. 5. Aortic atherosclerosis. Aortic  Atherosclerosis (ICD10-I70.0). Electronically Signed   By: Abelardo Diesel M.D.   On: 04/29/2022 14:45   DG Chest Portable 1 View  Result Date: 04/29/2022 CLINICAL DATA:  Generalized weakness with chest pain EXAM: PORTABLE CHEST 1 VIEW COMPARISON:  04/09/2022 FINDINGS: Dual-chamber pacer leads from the left in unchanged position, including outward directed right atrial lead. Central line on the right with tip at the upper cavoatrial junction. Artifact from EKG leads. There is no edema, consolidation, effusion, or pneumothorax. IMPRESSION: No acute finding when compared to 04/09/2022 Electronically Signed   By: Jorje Guild M.D.   On: 04/29/2022 12:29   CT Abdomen Pelvis W Contrast  Result Date: 04/27/2022 CLINICAL DATA:  Abdominal pain, acute, nonlocalized. Progressive abdominal pain with nausea and vomiting. History of Crohn's disease and diverticulitis with multiple previous bowel surgeries. EXAM: CT ABDOMEN AND PELVIS WITH CONTRAST TECHNIQUE: Multidetector CT imaging of the abdomen and pelvis was performed using the standard protocol following bolus administration of intravenous contrast. RADIATION DOSE REDUCTION: This exam was performed according to the departmental dose-optimization program which includes automated exposure control, adjustment of the mA and/or kV according to patient size and/or use of iterative reconstruction technique. CONTRAST:  144m OMNIPAQUE IOHEXOL 300 MG/ML  SOLN COMPARISON:  Abdominopelvic CT 03/13/2022 and 01/24/2022. FINDINGS: Lower chest: Stable subpleural scarring laterally at the right lung base. No significant pleural or pericardial effusion. 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 There is mild distal esophageal wall thickening. Hepatobiliary: The liver is normal in density without suspicious focal abnormality. No evidence of gallstones, gallbladder wall thickening or biliary dilatation. Pancreas: Unremarkable. No pancreatic ductal dilatation or surrounding  inflammatory changes. Spleen: Normal in size without focal abnormality. Adrenals/Urinary Tract: Both adrenal glands appear normal. No evidence of renal or ureteral calculus. Both kidneys appear normal. No hydronephrosis or delayed contrast excretion. As before, possible dependent right-sided bladder calculi. Possible mild bladder wall thickening without other focal abnormality. Stomach/Bowel: No enteric contrast administered. As above, mild distal esophageal wall thickening. The stomach appears unremarkable for its degree of distention. The stomach appears normal for its degree of distention. No small bowel distension or wall thickening is identified. No obvious interval bowel surgery. There is a diverting ileostomy in the right lower quadrant. Evidence of previous partial right colon resection with ileocolonic anastomosis as well as left hemicolectomy with an anastomosis between the transverse and the sigmoid colon. The surrounding inflammatory changes seen in the right lower quadrant on the most recent study have improved. No suspicious extraluminal fluid or definite extraluminal air collections. Vascular/Lymphatic: There are no enlarged abdominal or pelvic lymph nodes. Mild aortic and branch vessel atherosclerosis without evidence of aneurysm or large vessel occlusion. Reproductive: Stable mild enlargement of the prostate gland. Other: Previously demonstrated large fluid collection within the anterior abdominal wall has nearly completely resolved. Otherwise stable postsurgical changes in the anterior abdominal wall. No enlarging intra-abdominal fluid collections or progressive inflammatory changes. No residual pneumoperitoneum. Musculoskeletal: No acute or significant osseous findings. The sacroiliac joints appear unremarkable. IMPRESSION: 1. No acute findings or clear explanation for the patient's symptoms. 2. Interval improvement in previously demonstrated inflammatory changes in the right lower quadrant of the  abdomen with near complete resolution of the large fluid collection in the anterior abdominal wall. No evidence of bowel obstruction or perforation. 3.  Stable postsurgical changes as described with a diverting ileostomy in the right lower quadrant. 4. Possible small dependent bladder calculi, as before. 5.  Aortic Atherosclerosis (ICD10-I70.0). Electronically Signed   By: Richardean Sale M.D.   On: 04/27/2022 15:09   DG Chest 2 View  Result Date: 04/09/2022 CLINICAL DATA:  Shortness of breath EXAM: CHEST - 2 VIEW COMPARISON:  11/26/2021 and prior studies FINDINGS: The cardiomediastinal silhouette is unremarkable. LEFT-sided pacemaker again noted. There is no evidence of focal airspace disease, pulmonary edema, suspicious pulmonary nodule/mass, pleural effusion, or pneumothorax. No acute bony abnormalities are identified. IMPRESSION: No active cardiopulmonary disease. Electronically Signed   By: Margarette Canada M.D.   On: 04/09/2022 17:11    Microbiology: Results for orders placed or performed during the hospital encounter of 04/29/22  Blood culture (routine x 2)     Status: None (Preliminary result)   Collection Time: 04/29/22  4:11 PM   Specimen: BLOOD  Result Value Ref Range Status   Specimen Description   Final    BLOOD LEFT ANTECUBITAL Performed at [facility] [address] [place], [place] [address]    Special Requests   Final    BOTTLES DRAWN AEROBIC ONLY Blood Culture results may not be optimal due to an inadequate volume of blood received in culture bottles Performed at [facility] [address] [place], [place] [address]    Culture   Final    NO GROWTH 4 DAYS Performed at [facility], [place] [address] [place], [place] [address]    Report Status PENDING  Incomplete  Blood culture (routine x 2)     Status: None (Preliminary result)   Collection Time: 04/29/22  4:11 PM   Specimen: BLOOD  Result Value Ref Range Status   Specimen  Description   Final    BLOOD RIGHT ANTECUBITAL Performed at [facility] [address] [place], [place] [address]    Special Requests   Final    BOTTLES DRAWN AEROBIC ONLY Blood Culture adequate volume Performed at [facility] [address] [place], [place] [address]    Culture   Final    NO GROWTH 4 DAYS Performed at [facility], [place] [address] [place], [place] [address]    Report Status PENDING  Incomplete  Urine Culture     Status: None   Collection Time: 04/29/22  4:42 PM   Specimen: Urine, Clean Catch  Result Value Ref Range Status   Specimen Description   Final    URINE, CLEAN CATCH Performed at [facility], [place] [address], [place], [place] [address]    Special Requests   Final    NONE Performed at [facility], [place] [address], [place], [place] [address]    Culture   Final    NO GROWTH Performed at [facility], [place] [address], [place],  [address]    Report Status 04/30/2022 FINAL  Final  MRSA Next Gen by PCR, Nasal     Status: Abnormal   Collection Time: 04/29/22  5:16 PM   Specimen: Nasal Mucosa; Nasal Swab  Result Value Ref Range Status   MRSA by PCR Next Gen DETECTED (A) NOT DETECTED Final    Comment: CRITICAL RESULT CALLED TO, READ BACK BY AND VERIFIED WITH: DOSTER,S AT 2117 ON 04/29/22 BY LUZOLOP (NOTE) The GeneXpert MRSA Assay (FDA approved for NASAL specimens only), is one component of a comprehensive MRSA  colonization surveillance program. It is not intended to diagnose MRSA infection nor to guide or monitor treatment for MRSA infections. Test performance is not FDA approved in patients less than 69 years old. Performed at [facility], [place] [address], [place], [place] [address]     Labs: CBC: Recent Labs  Lab 04/27/22 1238 04/29/22 1619 04/30/22 0303 05/01/22 0447 05/02/22 0500 05/03/22 0613  WBC 6.2 6.2 4.9 3.4* 3.8* 4.1   NEUTROABS 3.2 3.9 2.2 1.8 1.6*  --   HGB 10.0* 9.2* 9.1* 8.8* 9.2* 9.1*  HCT 33.0* 30.0* 30.2* 29.7* 31.0* 30.2*  MCV 80.7 80.2 80.3 81.6 81.6 80.7  PLT 175 154 144* 142* 149* 330*   Basic Metabolic Panel: Recent Labs  Lab 04/29/22 1229 04/29/22 1234 04/30/22 0303 05/01/22 0447 05/02/22 0500 05/03/22 0613  NA 138  --  139 136 137 136  K 3.5  --  3.4* 3.7 4.2 3.9  CL 111  --  110 107 105 104  CO2 23  --  24 26 28 29   GLUCOSE 45*  --  102* 109* 101* 100*  BUN 22  --  13 9 15 19   CREATININE 0.84  --  0.77 0.87 0.75 0.94  CALCIUM 8.7*  --  8.1* 7.9* 8.5* 8.7*  MG  --  2.0 1.7 2.0  --  2.2  PHOS  --  1.1* 4.4 3.3 3.6 3.6   Liver Function Tests: Recent Labs  Lab 04/27/22 1238 04/29/22 1229 04/30/22 0303 05/01/22 0447 05/02/22 0500 05/03/22 0613  AST 26 34 18 21  --  17  ALT 18 16 14 13   --  11  ALKPHOS 73 70 57 55  --  49  BILITOT 0.5 0.7 0.5 0.4  --  0.4  PROT 6.5 6.3* 5.5* 5.5*  --  6.3*  ALBUMIN 3.2* 3.2* 2.7* 2.8* 2.7* 3.7   CBG: Recent Labs  Lab 05/02/22 1256 05/02/22 1733 05/02/22 2347 05/03/22 0607 05/03/22 1205  GLUCAP 129* 113* 106* 96 107*    Discharge time spent: greater than 30 minutes.  Signed: Vernelle Emerald, MD [facility] 05/03/2022

## 2022-05-03 NOTE — Progress Notes (Signed)
Pt discharged to [facility] via [facility], report called to RN at facility, pt satisfied all belonging returned, pt discharged with subclavien PICC in place. Ventura Bruns, RN 05/03/22  6:28 PM

## 2022-05-03 NOTE — Progress Notes (Signed)
PHARMACY - TOTAL PARENTERAL NUTRITION CONSULT NOTE   Indication: Crohn's disease and complex past surgical history including ex lap with small bowel resection, lysis of adhesion, mesh explantation, and diverting loop ileostomy in April 2023, on TPN prior to admission  Patient Measurements: Height: 5' 10"  (177.8 cm) Weight: 72 kg (158 lb 11.7 oz) IBW/kg (Calculated) : 73 TPN AdjBW (KG): 70.3 Body mass index is 22.78 kg/m.  Assessment: Patient presented with hypoglycemia/abdominal pain/nausea and vomiting. Staff from SNF reported pt set the TPN infusion to open rate just prior to admission.To continue TPN as inpatient; attempting to match outpatient formula.  Discharge from [place] 04/19/22:  [facility]: [name], PharmD, ph: [phone]  Previous RD: Estimated Nutrient Requirements at [facility] Protein: 1.5 - 1.7 g/kg/day (98 - 104 g/day) Total kcal: 30 - 35 kcal/kg/day (1830 - 1983 kcal/day)   TPN Formulation: [A 6.6%, D 20%, F 3.1%] @ goal rate, 65 mL/hr (TV: 1560 mL/day) provides:  Protein 1.7 g/kg/day 103 g/day  Total kcal 32 kcal/kg/day 1971 kcal/day  NPC: N ratio 94   TPN Goal rate: 65 mL/hr (TV: 1560 mL/day)   Glucose / Insulin: No hx DM - only 1 CBG > 150 and no episodes of hypoglycemia since 10/15 - 1 units SSI required yesterday Electrolytes: all  stable WNL Renal: BUN, SCr stable WNL; UOP down today (previously WNL) - RN reported bladder spasms yesterday; no Foley/condom cath in place Hepatic: LFTs/TG stable WNL; albumin improved to WNL I/O: ostomy OP stable ~400-600 cc/day - MIVF: none GI Imaging: - 10/14 CTa/p: previous surgical changes noted (transverse-sigmoid anastomosis) with stable inflammation since 10/12 CT GI Surgeries / Procedures: n/a  Central access: had prior to admission - PICC TPN start date: started PTA  Nutritional Goals: Goal TPN rate is 80 mL/hr (provides 113 g of protein and 1943 kcals per day)  RD Assessment: (updated  10/16) Estimated Needs Total Energy Estimated Needs: 2100-2450kcal (30-35kcal/kg) Total Protein Estimated Needs: 105-130g (1.5-1.8g/kg) Total Fluid Estimated Needs: 1750-216m (25-334mkg)  Current Nutrition:  Regular diet  Plan:  Ensure pt able to void appropriately  Adjust TPN to 80 mL/hr (switching to 10% AA as pt not fluid-restricted) Current formulation provides 113g protein, 1943 kcal/day Electrolytes in TPN: no changes Na 5034mL K 15m29m Ca 5mEq48m Mg 5mEq/16mPhos 15mmol46ml:Ac 1:1 Convert MVI/trace to PO (as doing PTA) Stop SSI with well-controlled CBGs (continue standing RN protocol to check CBG if suspicion of hypoglycemia) Continue IVFs per Md TPN labs on Mon/Thurs  Britaney Espaillat WoReuel BoomD, BCPS [phone], 10:23 AM

## 2022-05-03 NOTE — TOC Transition Note (Signed)
Transition of Care [facility]) - CM/SW Discharge Note   Patient Details  Name: [name] MRN: [id number] Date of Birth: Dec 19, 1950  [facility] CM/SW Contact:  Leeroy Cha, RN Phone Number: 05/03/2022, 3:09 PM   Clinical Narrative:     Patient to return to [facility] sn[facility].  Will continue on the tpn . Ptar called by the floor rn when dc summary is in.  Final next level of care: Skilled Nursing Facility Barriers to Discharge: Barriers Resolved   Patient Goals and CMS Choice Patient states their goals for this hospitalization and ongoing recovery are:: i would like to be able to go back home CMS Medicare.gov Compare Post Acute Care list provided to:: Patient Choice offered to / list presented to : Patient  Discharge Placement                       Discharge Plan and Services   Discharge Planning Services: CM Consult Post Acute Care Choice: Skilled Nursing Facility                               Social Determinants of Health (SDOH) Interventions     Readmission Risk Interventions   No data to display

## 2022-05-03 NOTE — Consult Note (Addendum)
[facility] Nurse ostomy follow up Pt requested a review of his ostomy supplies prior to discharge.  Pt is familiar to [facility] team from recent admission; refer to consult notes from 10/15 and 10/16.  He had ostomy surgery performed earlier this year at [facility] and has been using a one piece convex pouch from [employer] prior to admission.  He is currently in a one piece flexible convex pouch from [employer] with a barrier ring and a belt, and is aware we do not carry the other brand.  Current pouch is intact with good seal.  Reviewed pouching routines with patient and he states he performed them prior to admission. Made sure patient had 2 extra sets of pouching supplies to transfer with him today, since he expects to discharge.  He denies need for further assistance of questions.  Use supplies: barrier ring, [employer] # [id number] and convex pouch [employer] # [id number]. Please re-consult if further assistance is needed.  Thank-you,  Julien Girt MSN, [place], [place], [place], [place]

## 2022-05-04 ENCOUNTER — Telehealth: Payer: Self-pay

## 2022-05-04 LAB — CULTURE, BLOOD (ROUTINE X 2)
Culture: NO GROWTH
Culture: NO GROWTH
Special Requests: ADEQUATE

## 2022-05-04 NOTE — Patient Outreach (Signed)
[name] 06-15-1951 [id number]  . [place] [facility] [ACO] Patient: Medicare ACO REACH   Primary Care Provider: [name], MD [place] Management team and program and is listed for the [facility] follow up needs  [facility] Liaison remote coverage review for patient at [facility]    Patient screened for length of stay hospitalization with noted extreme high risk score for unplanned readmission risk.  Reviewed to assess for potential [place] Management service needs for post hospital transition for readmission prevention needs.  Review of patient's medical record reveals patient transitioned to a skilled      Plan:  Patient can be followed by [facility] RN, will alert to transition follow up.   For questions contact:    Natividad Brood, RN BSN [facility] Liaison  [phone] business mobile phone Toll free office [phone]  Fax number: [phone] [email or web address]

## 2022-05-04 NOTE — Telephone Encounter (Signed)
Transition Care Management Unsuccessful Follow-up Telephone Call  Date of discharge and from where:  [facility] 05/03/2022  Attempts:  1st Attempt  Reason for unsuccessful TCM follow-up call:  Left voice message Juanda Crumble, [place] Direct Dial [phone]

## 2022-05-09 ENCOUNTER — Other Ambulatory Visit: Payer: Self-pay | Admitting: *Deleted

## 2022-05-09 NOTE — Telephone Encounter (Signed)
Transition Care Management Unsuccessful Follow-up Telephone Call  Date of discharge and from where:  [facility] 05/03/2022  Attempts:  2nd Attempt  Reason for unsuccessful TCM follow-up call:  Unable to leave message Juanda Crumble, [place] Direct Dial [phone]

## 2022-05-09 NOTE — Patient Outreach (Signed)
[facility] Post- Acute Care Coordinator follow up. Mr. Calabrese resides in [facility]. Screening for potential [facility] care coordination services as benefit of insurance plan and PCP.  Facility site visit to [facility] earlier today. Went to bedside to speak with Mr. Culp. He was sleeping soundly. Left [facility] literature and writer's contact information at bedside.   Will follow up at later time.    Marthenia Rolling, MSN, RN,BSN [place] Acute Care Coordinator [phone] (Direct dial)

## 2022-05-12 ENCOUNTER — Emergency Department (HOSPITAL_COMMUNITY)
Admission: EM | Admit: 2022-05-12 | Discharge: 2022-05-12 | Discharge status: Against Medical Advice | Payer: Medicare Other | Source: Home / Self Care | Attending: Emergency Medicine | Admitting: Emergency Medicine

## 2022-05-12 ENCOUNTER — Emergency Department (HOSPITAL_COMMUNITY): Payer: Medicare Other

## 2022-05-12 DIAGNOSIS — Z7951 Long term (current) use of inhaled steroids: Secondary | ICD-10-CM | POA: Insufficient documentation

## 2022-05-12 DIAGNOSIS — R0781 Pleurodynia: Secondary | ICD-10-CM | POA: Insufficient documentation

## 2022-05-12 DIAGNOSIS — G9341 Metabolic encephalopathy: Secondary | ICD-10-CM | POA: Diagnosis not present

## 2022-05-12 DIAGNOSIS — J45909 Unspecified asthma, uncomplicated: Secondary | ICD-10-CM | POA: Insufficient documentation

## 2022-05-12 DIAGNOSIS — K9403 Colostomy malfunction: Secondary | ICD-10-CM | POA: Insufficient documentation

## 2022-05-12 DIAGNOSIS — T424X2A Poisoning by benzodiazepines, intentional self-harm, initial encounter: Secondary | ICD-10-CM | POA: Diagnosis not present

## 2022-05-12 DIAGNOSIS — Z950 Presence of cardiac pacemaker: Secondary | ICD-10-CM | POA: Insufficient documentation

## 2022-05-12 DIAGNOSIS — R112 Nausea with vomiting, unspecified: Secondary | ICD-10-CM | POA: Insufficient documentation

## 2022-05-12 DIAGNOSIS — R531 Weakness: Secondary | ICD-10-CM | POA: Insufficient documentation

## 2022-05-12 DIAGNOSIS — E871 Hypo-osmolality and hyponatremia: Secondary | ICD-10-CM | POA: Insufficient documentation

## 2022-05-12 DIAGNOSIS — R5383 Other fatigue: Secondary | ICD-10-CM | POA: Insufficient documentation

## 2022-05-12 DIAGNOSIS — K929 Disease of digestive system, unspecified: Secondary | ICD-10-CM

## 2022-05-12 DIAGNOSIS — R110 Nausea: Secondary | ICD-10-CM

## 2022-05-12 LAB — CBC WITH DIFFERENTIAL/PLATELET
Abs Immature Granulocytes: 0.03 10*3/uL (ref 0.00–0.07)
Basophils Absolute: 0 10*3/uL (ref 0.0–0.1)
Basophils Relative: 0 %
Eosinophils Absolute: 0.3 10*3/uL (ref 0.0–0.5)
Eosinophils Relative: 3 %
HCT: 31.6 % — ABNORMAL LOW (ref 39.0–52.0)
Hemoglobin: 9.6 g/dL — ABNORMAL LOW (ref 13.0–17.0)
Immature Granulocytes: 0 %
Lymphocytes Relative: 12 %
Lymphs Abs: 1.1 10*3/uL (ref 0.7–4.0)
MCH: 24.5 pg — ABNORMAL LOW (ref 26.0–34.0)
MCHC: 30.4 g/dL (ref 30.0–36.0)
MCV: 80.6 fL (ref 80.0–100.0)
Monocytes Absolute: 0.7 10*3/uL (ref 0.1–1.0)
Monocytes Relative: 7 %
Neutro Abs: 7.1 10*3/uL (ref 1.7–7.7)
Neutrophils Relative %: 78 %
Platelets: 160 10*3/uL (ref 150–400)
RBC: 3.92 MIL/uL — ABNORMAL LOW (ref 4.22–5.81)
RDW: 17.7 % — ABNORMAL HIGH (ref 11.5–15.5)
WBC: 9.2 10*3/uL (ref 4.0–10.5)
nRBC: 0 % (ref 0.0–0.2)

## 2022-05-12 LAB — COMPREHENSIVE METABOLIC PANEL
ALT: 15 U/L (ref 0–44)
AST: 22 U/L (ref 15–41)
Albumin: 3.5 g/dL (ref 3.5–5.0)
Alkaline Phosphatase: 84 U/L (ref 38–126)
Anion gap: 5 (ref 5–15)
BUN: 25 mg/dL — ABNORMAL HIGH (ref 8–23)
CO2: 22 mmol/L (ref 22–32)
Calcium: 8.5 mg/dL — ABNORMAL LOW (ref 8.9–10.3)
Chloride: 107 mmol/L (ref 98–111)
Creatinine, Ser: 0.94 mg/dL (ref 0.61–1.24)
GFR, Estimated: >60 mL/min (ref >60)
Glucose, Bld: 92 mg/dL (ref 70–99)
Potassium: 3.9 mmol/L (ref 3.5–5.1)
Sodium: 134 mmol/L — ABNORMAL LOW (ref 135–145)
Total Bilirubin: 0.6 mg/dL (ref 0.3–1.2)
Total Protein: 6.9 g/dL (ref 6.5–8.1)

## 2022-05-12 LAB — MAGNESIUM: Magnesium: 1.8 mg/dL (ref 1.7–2.4)

## 2022-05-12 LAB — URINALYSIS, ROUTINE W REFLEX MICROSCOPIC
Bilirubin Urine: NEGATIVE
Glucose, UA: NEGATIVE mg/dL
Hgb urine dipstick: NEGATIVE
Ketones, ur: NEGATIVE mg/dL
Leukocytes,Ua: NEGATIVE
Nitrite: NEGATIVE
Protein, ur: NEGATIVE mg/dL
Specific Gravity, Urine: 1.019 (ref 1.005–1.030)
pH: 5 (ref 5.0–8.0)

## 2022-05-12 LAB — PHOSPHORUS: Phosphorus: 3 mg/dL (ref 2.5–4.6)

## 2022-05-12 LAB — LIPASE, BLOOD: Lipase: 34 U/L (ref 11–51)

## 2022-05-12 MED ORDER — IOHEXOL 300 MG/ML  SOLN
100.0000 mL | Freq: Once | INTRAMUSCULAR | Status: AC | PRN
  Administered 2022-05-12: 100 mL via INTRAVENOUS

## 2022-05-12 MED ORDER — OXYCODONE HCL 5 MG PO TABS
5.0000 mg | ORAL_TABLET | Freq: Once | ORAL | Status: AC
  Administered 2022-05-12: 5 mg via ORAL
  Filled 2022-05-12: qty 1

## 2022-05-12 MED ORDER — LACTATED RINGERS IV BOLUS
1000.0000 mL | Freq: Once | INTRAVENOUS | Status: AC
  Administered 2022-05-12: 1000 mL via INTRAVENOUS

## 2022-05-12 MED ORDER — MORPHINE SULFATE (PF) 4 MG/ML IV SOLN
4.0000 mg | Freq: Once | INTRAVENOUS | Status: AC
  Administered 2022-05-12: 4 mg via INTRAVENOUS
  Filled 2022-05-12: qty 1

## 2022-05-12 MED ORDER — ONDANSETRON HCL 4 MG/2ML IJ SOLN
4.0000 mg | Freq: Once | INTRAMUSCULAR | Status: AC
  Administered 2022-05-12: 4 mg via INTRAVENOUS
  Filled 2022-05-12: qty 2

## 2022-05-12 NOTE — Discharge Instructions (Addendum)
  You have been seen in the Emergency Department (ED) for pain at stoma site and nausea. Your workup was generally reassuring and did not require admission to the hospital today or further work-up by a surgeon at this time.  You had no evidence of infection or bowel obstruction.  There is just minimal inflammation at the anastomosis site of your ileostomy. Discuss your concerns with your surgeon.  Continue to take Tylenol and ibuprofen as needed for pain control as well as your home pain medications for severe pain.  Please follow up with your primary care doctor as soon as possible regarding today's ED visit and the symptoms that are bothering you.  Return to the ED if your abdominal pain worsens or fails to improve, you develop bloody vomiting, bloody diarrhea, you are unable to tolerate fluids due to vomiting, fever greater than 101, or any other concerning symptoms.

## 2022-05-12 NOTE — ED Notes (Signed)
Ptar called

## 2022-05-12 NOTE — ED Notes (Signed)
Attempted to call report - nurse unavailable

## 2022-05-12 NOTE — ED Triage Notes (Signed)
EMS brings pt in from [facility]. Pt states something is wrong with his ileostomy.

## 2022-05-12 NOTE — ED Provider Notes (Signed)
[facility] DEPT Provider Note   CSN: [id number] Arrival date & time: 05/12/22  0909     History  Chief Complaint  Patient presents with   GI Problem    [name] is a 71 y.o. male. With pmh Crohn's disease status post complex surgical history including left hemicolectomy, ileostomy (exploratory laparotomy, enterocutaneous fistula take down, loop illeostomy creation, small bowel resection, ileocolectomy, colonoscopy 02/15/22 with Dr Drue Flirt [facility]) on TPN, chronic prostatitis, SVT with pacemaker, asthma, recent admission and discharge 05/03/22 for malnutrition, urology issues and hypotension presenting today with complaints of increasing pain and retraction of stoma site and generalized fatigue and weakness.  Since patient's recent discharge from the hospital, he still feels generally fatigued and weak although slightly improved.  He takes Zofran which prevents him from vomiting but he still feels persistently nauseous.  He still does TPN daily.  He has had decreased p.o. intake and decreased ostomy output.  He came here today for increasing pain at his ostomy site.  He feels like it is more retracted than usual and has noted some foul-smelling discharge coming from the site.  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  He has had no fevers at home, no URI symptoms chest pain or shortness of breath.  He feels generally weak with no focal weakness.  He also is complaining of some left-sided rib pain from a recent fall this week.  Did not hit his head or lose consciousness.   GI Problem       Home Medications Prior to Admission medications   Medication Sig Start Date End Date Taking? Authorizing Provider  acetaminophen (TYLENOL) 325 MG tablet Take 650 mg by mouth every 6 (six) hours as needed for moderate pain.    [provider]  albuterol (PROVENTIL HFA;VENTOLIN HFA) 108 (90 Base)  MCG/ACT inhaler INHALE 2 PUFFS INTO THE LUNGS EVERY 4 (FOUR) HOURS AS NEEDED FOR WHEEZING OR SHORTNESS OF BREATH. Patient taking differently: Inhale 2 puffs into the lungs every 6 (six) hours as needed for wheezing or shortness of breath. 10/24/18   [name], MD  Amino Ac Elect-Calc in D[facility] E/DEXTROSE, 5/15, IV) Inject 65 mL/hr into the vein continuous. Via powerline TPN IV (104gm aa/183 GM DEC/50GM)    [provider]  busPIRone (BUSPAR) 5 MG tablet Take 5 mg by mouth 3 (three) times daily.    [provider]  calcium carbonate (TUMS - DOSED IN MG ELEMENTAL CALCIUM) 500 MG chewable tablet Chew 500 mg by mouth 3 (three) times daily as needed for indigestion or heartburn.    [provider]  melatonin 3 MG TABS tablet Take 3 mg by mouth at bedtime.    [provider]  morphine (MSIR) 15 MG tablet Take 15 mg by mouth every 4 (four) hours as needed for severe pain.    [provider]  Multiple Vitamin (MULTIVITAMIN WITH MINERALS) TABS tablet Take 1 tablet by mouth daily.    [provider]  omeprazole (PRILOSEC OTC) 20 MG tablet Take 20 mg by mouth daily.    [provider]  ondansetron (ZOFRAN-ODT) 4 MG disintegrating tablet Take 4 mg by mouth every 6 (six) hours as needed for nausea or vomiting.    [provider]  OVER THE COUNTER MEDICATION Take 1 Bottle by mouth in the morning and at bedtime. Medpass 128m supplement  [provider]  oxybutynin (DITROPAN) 5 MG tablet Take 1 tablet (5 mg total) by mouth 3 (three) times daily. 05/03/22   Shalhoub, Sherryll Burger, MD  psyllium (HYDROCIL/METAMUCIL) 95 % PACK Take 1 packet by mouth daily.    [provider]  simethicone (MYLICON) 80 MG chewable tablet Chew 80 mg by mouth 4 (four) times daily -  with meals and at bedtime.    [provider]  sulfamethoxazole-trimethoprim (BACTRIM DS) 800-160 MG tablet Take 1 tablet by mouth every 12 (twelve) hours  for 25 days. 05/03/22 05/28/22  Shalhoub, Sherryll Burger, MD  tamsulosin (FLOMAX) 0.4 MG CAPS capsule Take 1 capsule (0.4 mg total) by mouth daily after supper. 05/03/22   Shalhoub, Sherryll Burger, MD  traZODone (DESYREL) 50 MG tablet Take 100 mg by mouth at bedtime.    [provider]      Allergies    Mercaptopurine, Shellfish allergy, Humira [adalimumab], and Wound dressing adhesive    Review of Systems   Review of Systems  Physical Exam Updated Vital Signs BP (!) 111/55   Pulse 63   Temp (!) 97.5 F (36.4 C) (Oral)   Resp 11   SpO2 99%  Physical Exam Constitutional: Alert and oriented. Chronically ill appearing but no acute distress Eyes: Conjunctivae are normal. ENT      Head: Normocephalic and atraumatic.      Nose: No congestion.      Mouth/Throat: Mucous membranes are dry, cracked peeling lips.      Neck: No stridor. Cardiovascular: S1, S2, regular rate and rhythm Chest wall: Tenderness to palpation of the lower left posterior lateral chest wall with no external evidence of injury, no ecchymoses, no edema, no erythema Respiratory: Normal respiratory effort. Breath sounds are normal.  O2 sat 99 Gastrointestinal: Soft and tenderness around stoma site.  Stoma is red/pink and healthy-appearing with no necrosis, no purulent drainage, no surrounding skin breakdown, no surrounding hernia.  Visualized picture in media Musculoskeletal: Normal range of motion in all extremities.      Right lower leg: No tenderness or edema.      Left lower leg: No tenderness or edema. Neurologic: Normal speech and language.  No facial droop.  Sensation grossly intact.  Range of motion of all extremities intact.  No gross focal neurologic deficits are appreciated. Skin: Skin is warm, dry and intact. No rash noted. Psychiatric: Mood and affect are normal. Speech and behavior are normal.    ED Results / Procedures / Treatments   Labs (all labs ordered are listed, but only abnormal results are  displayed) Labs Reviewed  CBC WITH DIFFERENTIAL/PLATELET - Abnormal; Notable for the following components:      Result Value   RBC 3.92 (*)    Hemoglobin 9.6 (*)    HCT 31.6 (*)    MCH 24.5 (*)    RDW 17.7 (*)    All other components within normal limits  COMPREHENSIVE METABOLIC PANEL - Abnormal; Notable for the following components:   Sodium 134 (*)    BUN 25 (*)    Calcium 8.5 (*)    All other components within normal limits  LIPASE, BLOOD  MAGNESIUM  PHOSPHORUS  URINALYSIS, ROUTINE W REFLEX MICROSCOPIC    EKG EKG Interpretation  Date/Time:  Friday May 12 2022 10:48:59 EDT Ventricular Rate:  62 PR Interval:    QRS Duration: 101 QT Interval:  418 QTC Calculation: 425 R Axis:   -56 Text Interpretation: Age not entered, assumed to be  71  years old for purpose of ECG interpretation Junctional rhythm Incomplete RBBB and LAFB Abnormal R-wave progression, early transition NSR; No significant change since last tracing Confirmed by Georgina Snell [phone]) on 05/12/2022 10:55:55 AM  Radiology CT CHEST ABDOMEN PELVIS W CONTRAST  Result Date: 05/12/2022 CLINICAL DATA:  Left posterolateral rib pain.  Pain at ostomy site. EXAM: CT CHEST, ABDOMEN, AND PELVIS WITH CONTRAST TECHNIQUE: Multidetector CT imaging of the chest, abdomen and pelvis was performed following the standard protocol during bolus administration of intravenous contrast. RADIATION DOSE REDUCTION: This exam was performed according to the departmental dose-optimization program which includes automated exposure control, adjustment of the mA and/or kV according to patient size and/or use of iterative reconstruction technique. CONTRAST:  156m OMNIPAQUE IOHEXOL 300 MG/ML  SOLN COMPARISON:  CT chest/abdomen/pelvis 04/29/2022, CT abdomen/pelvis 04/28/2019 FINDINGS: CT CHEST FINDINGS Cardiovascular: The heart size is normal. There is no pericardial effusion. The major vasculature of the chest is normal. A left chest wall cardiac  device is in place with leads terminating in the right atrium and right ventricle. A right-sided IJ vascular catheter is in place terminating in the lower SVC/cavoatrial junction. Mediastinum/Nodes: The thyroid is unremarkable. The esophagus is grossly unremarkable. There is no mediastinal, hilar, or axillary lymphadenopathy. Lungs/Pleura: The trachea and central airways are patent. The lungs well inflated. Subpleural scarring in the lateral right lower lobe is unchanged. There is no other focal airspace disease. There is no pulmonary edema. There is no pleural effusion or pneumothorax. There are no suspicious nodules. Musculoskeletal: There is no acute rib fracture. There is no other acute osseous abnormality. There is no suspicious osseous lesion. CT ABDOMEN PELVIS FINDINGS Hepatobiliary: The liver and gallbladder are unremarkable. There is no biliary ductal dilatation. Pancreas: Unremarkable. Spleen: Unremarkable. Adrenals/Urinary Tract: The adrenals are unremarkable. The kidneys are unremarkable, with no focal lesion, renal or ureteral stone, hydronephrosis, or hydroureter. The small stones layering dependently in the bladder are unchanged. There is symmetric excretion of contrast into the collecting systems on the delayed images. Stomach/Bowel: The stomach is unremarkable. There is no evidence of bowel obstruction. Postsurgical changes reflecting partial colectomy with anastomosis in the right lower quadrant and right lower quadrant diverting loop ileostomy are again seen. Inflammatory changes in the right lower quadrant are overall similar to the prior study from 04/29/2022 and 04/27/2022. There is no organized or drainable fluid collection. There is no free intraperitoneal air. There is no other abnormal bowel wall thickening or inflammatory change. There are a few colonic diverticuli in the remaining colon without evidence of acute diverticulitis. Vascular/Lymphatic: Scattered calcified atherosclerotic  plaque is seen in the nonaneurysmal abdominal aorta. The major branch vessels are patent. The main portal and splenic veins are patent. There is no abdominal or pelvic lymphadenopathy. Reproductive: The prostate and seminal vesicles are unremarkable. Other: There is no ascites or free air. Postsurgical changes along the midline anterior abdominal wall are stable. Musculoskeletal: There is no acute osseous abnormality or suspicious osseous lesion. IMPRESSION: 1. Overall, no significant interval change since the CT chest/abdomen/pelvis from 04/29/2022. 2. Stable inflammatory change in the right lower quadrant in the region of the anastomosis and diverting ileostomy. No evidence of bowel obstruction or other complicating feature at the anastomotic site or ostomy. 3. Unchanged small stones layering dependently in the bladder. 4. No acute pathology in the chest. Electronically Signed   By: PValetta MoleM.D.   On: 05/12/2022 12:06    Procedures Procedures  Remained on constant cardiac monitoring, NSR.   Medications  Ordered in ED Medications  morphine (PF) 4 MG/ML injection 4 mg (4 mg Intravenous Given 05/12/22 1002)  ondansetron (ZOFRAN) injection 4 mg (4 mg Intravenous Given 05/12/22 1000)  lactated ringers bolus 1,000 mL (0 mLs Intravenous Stopped 05/12/22 1108)  iohexol (OMNIPAQUE) 300 MG/ML solution 100 mL (100 mLs Intravenous Contrast Given 05/12/22 1129)    ED Course/ Medical Decision Making/ A&P                           Medical Decision Making [name] is a 71 y.o. male. With pmh Crohn's disease status post complex surgical history including left hemicolectomy, ileostomy (exploratory laparotomy, enterocutaneous fistula take down, loop illeostomy creation, small bowel resection, ileocolectomy, colonoscopy 02/15/22 with Dr Drue Flirt [facility]) on TPN, chronic prostatitis, SVT with pacemaker, asthma, recent admission and discharge 05/03/22 for malnutrition, urology issues and hypotension presenting today  with complaints of increasing pain and retraction of stoma site and generalized fatigue and weakness.  Patient has very complex GI surgical history as listed above with stoma complaints.  On exam, it does not appear necrotic, it does not appear infected, there is no bleeding there is no issue such as redness or drainage concerning for parastomal cellulitis.  Considering his increasing pain at the site, consider possible internal hernia or obstruction, retraction among multiple other etiologies.  CTAP with contrast obtained to further evaluate for these issues. On my personal review, no evidence of obstruction or free air. Radiology read which showed only mild inflammation in the right lower quadrant at the anastomosis of the diverting ileostomy.  Otherwise no obstruction, no abscess, no other concerning findings or need for surgery.  CT chest obtained due to concern for rib fracture from previous fall in the past week which showed no pneumonia, no fractured ribs, no pneumothorax.  Considering generalized fatigue and weakness, with decreased p.o. intake, more likely due to metabolic or acute electrolyte abnormalities or possible AKI.  No concern for stroke or neurologic issue with no focal deficits and chronicity of issues.  Labs obtained and reviewed which showed mild hyponatremia 134.  Reassuring potassium 3.9 with creatinine 0.94 within normal limits.  Magnesium 1.8 and phosphorus within normal limits.  Anemia hemoglobin 9.6 at baseline.  Normal white blood cell count 9.2.  Since work-up was generally unremarkable and reassuring, advised patient to follow-up with his surgeon regarding his concerns of pain at ostomy site and mild inflammation.  Discussed there is no evidence of infection at the ostomy and with no systemic signs of illness and no fever, no need for antibiotics.  Advised continued home meds with Tylenol and ibuprofen for pain control.  Discussed strict return precautions.  He is safe for  discharge.  Amount and/or Complexity of Data Reviewed Labs: ordered. Radiology: ordered.  Risk Prescription drug management.    Final Clinical Impression(s) / ED Diagnoses Final diagnoses:  Nausea  Disorder of stoma    Rx / DC Orders ED Discharge Orders     None         Elgie Congo, MD 05/12/22 1219

## 2022-05-13 ENCOUNTER — Emergency Department (HOSPITAL_COMMUNITY): Payer: Medicare Other

## 2022-05-13 ENCOUNTER — Inpatient Hospital Stay (HOSPITAL_COMMUNITY)
Admission: EM | Admit: 2022-05-13 | Discharge: 2022-06-23 | DRG: 917 | Disposition: A | Discharge status: Home Health | Payer: Medicare Other | Attending: Student | Admitting: Student

## 2022-05-13 ENCOUNTER — Other Ambulatory Visit: Payer: Self-pay

## 2022-05-13 DIAGNOSIS — N411 Chronic prostatitis: Secondary | ICD-10-CM | POA: Diagnosis present

## 2022-05-13 DIAGNOSIS — E538 Deficiency of other specified B group vitamins: Secondary | ICD-10-CM | POA: Diagnosis present

## 2022-05-13 DIAGNOSIS — J690 Pneumonitis due to inhalation of food and vomit: Secondary | ICD-10-CM | POA: Diagnosis not present

## 2022-05-13 DIAGNOSIS — T424X2A Poisoning by benzodiazepines, intentional self-harm, initial encounter: Secondary | ICD-10-CM | POA: Diagnosis present

## 2022-05-13 DIAGNOSIS — E43 Unspecified severe protein-calorie malnutrition: Secondary | ICD-10-CM | POA: Diagnosis not present

## 2022-05-13 DIAGNOSIS — Z950 Presence of cardiac pacemaker: Secondary | ICD-10-CM | POA: Diagnosis not present

## 2022-05-13 DIAGNOSIS — J9601 Acute respiratory failure with hypoxia: Secondary | ICD-10-CM | POA: Diagnosis not present

## 2022-05-13 DIAGNOSIS — Z1152 Encounter for screening for COVID-19: Secondary | ICD-10-CM | POA: Diagnosis not present

## 2022-05-13 DIAGNOSIS — Z20822 Contact with and (suspected) exposure to covid-19: Secondary | ICD-10-CM | POA: Diagnosis present

## 2022-05-13 DIAGNOSIS — K90829 Short bowel syndrome, unspecified: Secondary | ICD-10-CM | POA: Diagnosis present

## 2022-05-13 DIAGNOSIS — T1491XA Suicide attempt, initial encounter: Secondary | ICD-10-CM | POA: Diagnosis not present

## 2022-05-13 DIAGNOSIS — T50902A Poisoning by unspecified drugs, medicaments and biological substances, intentional self-harm, initial encounter: Secondary | ICD-10-CM | POA: Diagnosis not present

## 2022-05-13 DIAGNOSIS — G894 Chronic pain syndrome: Secondary | ICD-10-CM | POA: Diagnosis present

## 2022-05-13 DIAGNOSIS — Z515 Encounter for palliative care: Secondary | ICD-10-CM | POA: Diagnosis not present

## 2022-05-13 DIAGNOSIS — Z79899 Other long term (current) drug therapy: Secondary | ICD-10-CM | POA: Diagnosis not present

## 2022-05-13 DIAGNOSIS — F064 Anxiety disorder due to known physiological condition: Secondary | ICD-10-CM | POA: Diagnosis present

## 2022-05-13 DIAGNOSIS — F332 Major depressive disorder, recurrent severe without psychotic features: Secondary | ICD-10-CM | POA: Diagnosis present

## 2022-05-13 DIAGNOSIS — Z806 Family history of leukemia: Secondary | ICD-10-CM

## 2022-05-13 DIAGNOSIS — D509 Iron deficiency anemia, unspecified: Secondary | ICD-10-CM | POA: Diagnosis present

## 2022-05-13 DIAGNOSIS — Y92009 Unspecified place in unspecified non-institutional (private) residence as the place of occurrence of the external cause: Secondary | ICD-10-CM | POA: Diagnosis not present

## 2022-05-13 DIAGNOSIS — K50918 Crohn's disease, unspecified, with other complication: Secondary | ICD-10-CM | POA: Diagnosis not present

## 2022-05-13 DIAGNOSIS — D649 Anemia, unspecified: Secondary | ICD-10-CM

## 2022-05-13 DIAGNOSIS — R401 Stupor: Secondary | ICD-10-CM | POA: Diagnosis not present

## 2022-05-13 DIAGNOSIS — G9341 Metabolic encephalopathy: Secondary | ICD-10-CM | POA: Diagnosis not present

## 2022-05-13 DIAGNOSIS — K5090 Crohn's disease, unspecified, without complications: Secondary | ICD-10-CM | POA: Diagnosis present

## 2022-05-13 DIAGNOSIS — Z79891 Long term (current) use of opiate analgesic: Secondary | ICD-10-CM

## 2022-05-13 DIAGNOSIS — R531 Weakness: Secondary | ICD-10-CM

## 2022-05-13 DIAGNOSIS — R102 Pelvic and perineal pain: Secondary | ICD-10-CM | POA: Diagnosis present

## 2022-05-13 DIAGNOSIS — Z888 Allergy status to other drugs, medicaments and biological substances status: Secondary | ICD-10-CM

## 2022-05-13 DIAGNOSIS — E876 Hypokalemia: Secondary | ICD-10-CM | POA: Diagnosis not present

## 2022-05-13 DIAGNOSIS — R300 Dysuria: Secondary | ICD-10-CM | POA: Diagnosis present

## 2022-05-13 DIAGNOSIS — Z8042 Family history of malignant neoplasm of prostate: Secondary | ICD-10-CM

## 2022-05-13 DIAGNOSIS — Z932 Ileostomy status: Secondary | ICD-10-CM | POA: Diagnosis not present

## 2022-05-13 DIAGNOSIS — G4700 Insomnia, unspecified: Secondary | ICD-10-CM | POA: Diagnosis present

## 2022-05-13 DIAGNOSIS — N210 Calculus in bladder: Secondary | ICD-10-CM | POA: Diagnosis present

## 2022-05-13 DIAGNOSIS — G928 Other toxic encephalopathy: Secondary | ICD-10-CM | POA: Diagnosis present

## 2022-05-13 DIAGNOSIS — Z91013 Allergy to seafood: Secondary | ICD-10-CM

## 2022-05-13 DIAGNOSIS — Z801 Family history of malignant neoplasm of trachea, bronchus and lung: Secondary | ICD-10-CM

## 2022-05-13 DIAGNOSIS — K9413 Enterostomy malfunction: Secondary | ICD-10-CM | POA: Diagnosis not present

## 2022-05-13 DIAGNOSIS — F322 Major depressive disorder, single episode, severe without psychotic features: Secondary | ICD-10-CM | POA: Diagnosis not present

## 2022-05-13 DIAGNOSIS — N401 Enlarged prostate with lower urinary tract symptoms: Secondary | ICD-10-CM | POA: Diagnosis not present

## 2022-05-13 DIAGNOSIS — I4891 Unspecified atrial fibrillation: Secondary | ICD-10-CM | POA: Diagnosis not present

## 2022-05-13 DIAGNOSIS — A419 Sepsis, unspecified organism: Secondary | ICD-10-CM | POA: Diagnosis not present

## 2022-05-13 DIAGNOSIS — K219 Gastro-esophageal reflux disease without esophagitis: Secondary | ICD-10-CM | POA: Diagnosis present

## 2022-05-13 DIAGNOSIS — N400 Enlarged prostate without lower urinary tract symptoms: Secondary | ICD-10-CM | POA: Diagnosis present

## 2022-05-13 DIAGNOSIS — Z8719 Personal history of other diseases of the digestive system: Secondary | ICD-10-CM | POA: Diagnosis not present

## 2022-05-13 DIAGNOSIS — Z6822 Body mass index (BMI) 22.0-22.9, adult: Secondary | ICD-10-CM

## 2022-05-13 DIAGNOSIS — L309 Dermatitis, unspecified: Secondary | ICD-10-CM | POA: Diagnosis not present

## 2022-05-13 DIAGNOSIS — Z8249 Family history of ischemic heart disease and other diseases of the circulatory system: Secondary | ICD-10-CM

## 2022-05-13 DIAGNOSIS — R4182 Altered mental status, unspecified: Secondary | ICD-10-CM | POA: Diagnosis not present

## 2022-05-13 DIAGNOSIS — I495 Sick sinus syndrome: Secondary | ICD-10-CM | POA: Diagnosis not present

## 2022-05-13 LAB — BLOOD GAS, ARTERIAL
Acid-Base Excess: 0.5 mmol/L (ref 0.0–2.0)
Bicarbonate: 26 mmol/L (ref 20.0–28.0)
O2 Saturation: 100 %
Patient temperature: 37
pCO2 arterial: 45 mmHg (ref 32–48)
pH, Arterial: 7.37 (ref 7.35–7.45)
pO2, Arterial: 136 mmHg — ABNORMAL HIGH (ref 83–108)

## 2022-05-13 LAB — COMPREHENSIVE METABOLIC PANEL
ALT: 16 U/L (ref 0–44)
AST: 23 U/L (ref 15–41)
Albumin: 3.4 g/dL — ABNORMAL LOW (ref 3.5–5.0)
Alkaline Phosphatase: 87 U/L (ref 38–126)
Anion gap: 7 (ref 5–15)
BUN: 18 mg/dL (ref 8–23)
CO2: 24 mmol/L (ref 22–32)
Calcium: 9 mg/dL (ref 8.9–10.3)
Chloride: 105 mmol/L (ref 98–111)
Creatinine, Ser: 0.99 mg/dL (ref 0.61–1.24)
GFR, Estimated: >60 mL/min (ref >60)
Glucose, Bld: 87 mg/dL (ref 70–99)
Potassium: 3.6 mmol/L (ref 3.5–5.1)
Sodium: 136 mmol/L (ref 135–145)
Total Bilirubin: 0.8 mg/dL (ref 0.3–1.2)
Total Protein: 6.8 g/dL (ref 6.5–8.1)

## 2022-05-13 LAB — CBC
HCT: 32.6 % — ABNORMAL LOW (ref 39.0–52.0)
Hemoglobin: 9.8 g/dL — ABNORMAL LOW (ref 13.0–17.0)
MCH: 24.3 pg — ABNORMAL LOW (ref 26.0–34.0)
MCHC: 30.1 g/dL (ref 30.0–36.0)
MCV: 80.9 fL (ref 80.0–100.0)
Platelets: 164 10*3/uL (ref 150–400)
RBC: 4.03 MIL/uL — ABNORMAL LOW (ref 4.22–5.81)
RDW: 17.9 % — ABNORMAL HIGH (ref 11.5–15.5)
WBC: 5.5 10*3/uL (ref 4.0–10.5)
nRBC: 0 % (ref 0.0–0.2)

## 2022-05-13 LAB — RAPID URINE DRUG SCREEN, HOSP PERFORMED
Amphetamines: NOT DETECTED
Barbiturates: NOT DETECTED
Benzodiazepines: POSITIVE — AB
Cocaine: NOT DETECTED
Opiates: POSITIVE — AB
Tetrahydrocannabinol: NOT DETECTED

## 2022-05-13 LAB — ETHANOL: Alcohol, Ethyl (B): <10 mg/dL (ref <10)

## 2022-05-13 LAB — CBG MONITORING, ED: Glucose-Capillary: 78 mg/dL (ref 70–99)

## 2022-05-13 LAB — ACETAMINOPHEN LEVEL: Acetaminophen (Tylenol), Serum: <10 ug/mL — ABNORMAL LOW (ref 10–30)

## 2022-05-13 LAB — SALICYLATE LEVEL: Salicylate Lvl: <7 mg/dL — ABNORMAL LOW (ref 7.0–30.0)

## 2022-05-13 MED ORDER — ONDANSETRON HCL 4 MG PO TABS: 4.0000 mg | ORAL_TABLET | Freq: Four times a day (QID) | ORAL | Status: DC | PRN

## 2022-05-13 MED ORDER — DEXTROSE-NACL 5-0.9 % IV SOLN: INTRAVENOUS | Status: DC

## 2022-05-13 MED ORDER — SODIUM CHLORIDE 0.9% FLUSH
10.0000 mL | INTRAVENOUS | Status: DC | PRN
  Administered 2022-05-17: 10 mL
  Administered 2022-05-23: 20 mL
  Administered 2022-05-23: 10 mL
  Administered 2022-05-23: 20 mL
  Administered 2022-06-03 – 2022-06-09 (×2): 10 mL
  Administered 2022-06-12 (×2): 20 mL

## 2022-05-13 MED ORDER — ONDANSETRON HCL 4 MG/2ML IJ SOLN
4.0000 mg | Freq: Four times a day (QID) | INTRAMUSCULAR | Status: DC | PRN
  Administered 2022-05-21 – 2022-05-25 (×6): 4 mg via INTRAVENOUS
  Filled 2022-05-13 (×6): qty 2

## 2022-05-13 MED ORDER — CHLORHEXIDINE GLUCONATE CLOTH 2 % EX PADS
6.0000 | MEDICATED_PAD | Freq: Every day | CUTANEOUS | Status: DC
  Administered 2022-05-13 – 2022-06-23 (×42): 6 via TOPICAL

## 2022-05-13 MED ORDER — ACETAMINOPHEN 650 MG RE SUPP
325.0000 mg | RECTAL | Status: DC | PRN
  Administered 2022-05-15 – 2022-05-23 (×6): 325 mg via RECTAL
  Filled 2022-05-13 (×6): qty 1

## 2022-05-13 MED ORDER — ENOXAPARIN SODIUM 40 MG/0.4ML IJ SOSY
40.0000 mg | PREFILLED_SYRINGE | INTRAMUSCULAR | Status: DC
  Administered 2022-05-13 – 2022-06-15 (×28): 40 mg via SUBCUTANEOUS
  Filled 2022-05-13 (×39): qty 0.4

## 2022-05-13 MED ORDER — ACETAMINOPHEN 325 MG PO TABS: 650.0000 mg | ORAL_TABLET | Freq: Four times a day (QID) | ORAL | Status: DC | PRN

## 2022-05-13 MED ORDER — NALOXONE HCL 2 MG/2ML IJ SOSY
1.0000 mg | PREFILLED_SYRINGE | Freq: Once | INTRAMUSCULAR | Status: AC
  Administered 2022-05-13: 1 mg via INTRAVENOUS
  Filled 2022-05-13: qty 2

## 2022-05-13 MED ORDER — DEXTROSE 50 % IV SOLN
25.0000 g | Freq: Once | INTRAVENOUS | Status: AC
  Administered 2022-05-13: 25 g via INTRAVENOUS
  Filled 2022-05-13: qty 50

## 2022-05-13 NOTE — ED Provider Notes (Signed)
[facility] DEPT Provider Note   CSN: [id number] Arrival date & time: 05/13/22  1142     History  Chief Complaint  Patient presents with   Altered Mental Status    MEHKI KLUMPP is a 71 y.o. male.  Pt presents via EMS noted with altered mental status. Pt not verbally responsive - level 5 caveat. Pt in ED yesterday with general weakness and chronic/worsening abd pain - had labs and imaging. Was awaiting transport back to facility when took [place] home.  Today, spouse found pt with some open pill bottles and altered/unresponsive. EMS indicates cbg normal. Narcan given without immediate change.  The names of open pills bottles and/or number of pills missing is not known, but by report there were pills left in each bottle.   The history is provided by the patient, the EMS personnel and medical records. The history is limited by the condition of the patient.  Altered Mental Status      Home Medications Prior to Admission medications   Medication Sig Start Date End Date Taking? Authorizing Provider  acetaminophen (TYLENOL) 325 MG tablet Take 650 mg by mouth every 6 (six) hours as needed for moderate pain.    [provider]  albuterol (PROVENTIL HFA;VENTOLIN HFA) 108 (90 Base) MCG/ACT inhaler INHALE 2 PUFFS INTO THE LUNGS EVERY 4 (FOUR) HOURS AS NEEDED FOR WHEEZING OR SHORTNESS OF BREATH. Patient taking differently: Inhale 2 puffs into the lungs every 6 (six) hours as needed for wheezing or shortness of breath. 10/24/18   [name], MD  Amino Ac Elect-Calc in D15W [facility] E/DEXTROSE, 5/15, IV) Inject 65 mL/hr into the vein continuous. Via powerline TPN IV (104gm aa/183 GM DEC/50GM)    [provider]  busPIRone (BUSPAR) 5 MG tablet Take 5 mg by mouth 3 (three) times daily.    [provider]  calcium carbonate (TUMS - DOSED IN MG ELEMENTAL CALCIUM) 500 MG chewable tablet Chew 500 mg by mouth 3 (three) times daily as needed  for indigestion or heartburn.    [provider]  melatonin 3 MG TABS tablet Take 3 mg by mouth at bedtime.    [provider]  morphine (MSIR) 15 MG tablet Take 15 mg by mouth every 4 (four) hours as needed for severe pain.    [provider]  Multiple Vitamin (MULTIVITAMIN WITH MINERALS) TABS tablet Take 1 tablet by mouth daily.    [provider]  omeprazole (PRILOSEC OTC) 20 MG tablet Take 20 mg by mouth daily.    [provider]  ondansetron (ZOFRAN-ODT) 4 MG disintegrating tablet Take 4 mg by mouth every 6 (six) hours as needed for nausea or vomiting.    [provider]  OVER THE COUNTER MEDICATION Take 1 Bottle by mouth in the morning and at bedtime. Medpass 152m supplement    [provider]  oxybutynin (DITROPAN) 5 MG tablet Take 1 tablet (5 mg total) by mouth 3 (three) times daily. 05/03/22   Shalhoub, GSherryll Burger MD  psyllium (HYDROCIL/METAMUCIL) 95 % PACK Take 1 packet by mouth daily.    [provider]  simethicone (MYLICON) 80 MG chewable tablet Chew 80 mg by mouth 4 (four) times daily -  with meals and at bedtime.    [provider]  sulfamethoxazole-trimethoprim (BACTRIM DS) 800-160 MG tablet Take 1 tablet by mouth every 12 (twelve) hours for 25 days. 05/03/22 05/28/22  Shalhoub, GSherryll Burger MD  tamsulosin (FLOMAX) 0.4 MG CAPS capsule Take  1 capsule (0.4 mg total) by mouth daily after supper. 05/03/22   Shalhoub, Sherryll Burger, MD  traZODone (DESYREL) 50 MG tablet Take 100 mg by mouth at bedtime.    [provider]      Allergies    Mercaptopurine, Shellfish allergy, Humira [adalimumab], and Wound dressing adhesive    Review of Systems   Review of Systems  Unable to perform ROS: Patient unresponsive    Physical Exam Updated Vital Signs BP (!) 157/49   Pulse 80   Temp 97.6 F (36.4 C) (Axillary)   Resp 15   SpO2 100%  Physical Exam Vitals and nursing note reviewed.  Constitutional:       Appearance: Normal appearance. He is well-developed.  HENT:     Head: Atraumatic.     Nose: Nose normal.     Mouth/Throat:     Mouth: Mucous membranes are moist.     Pharynx: Oropharynx is clear.  Eyes:     General: No scleral icterus.    Conjunctiva/sclera: Conjunctivae normal.     Pupils: Pupils are equal, round, and reactive to light.     Comments: Pupils ~ 4 mm.   Neck:     Vascular: No carotid bruit.     Trachea: No tracheal deviation.     Comments: No stiffness or rigidity.  Cardiovascular:     Rate and Rhythm: Normal rate and regular rhythm.     Pulses: Normal pulses.     Heart sounds: Normal heart sounds. No murmur heard.    No friction rub. No gallop.  Pulmonary:     Effort: Pulmonary effort is normal. No accessory muscle usage or respiratory distress.     Breath sounds: Normal breath sounds.     Comments: CVL without sign of infection.  Abdominal:     General: Bowel sounds are normal. There is no distension.     Palpations: Abdomen is soft.     Tenderness: There is no abdominal tenderness. There is no guarding.     Comments: Ostomy, patent, functioning, stool in bag.   Genitourinary:    Comments: No cva tenderness. Normal external gu exam.  Musculoskeletal:        General: No swelling or tenderness.     Cervical back: Normal range of motion and neck supple. No rigidity.     Right lower leg: No edema.     Left lower leg: No edema.  Skin:    General: Skin is warm and dry.     Findings: No rash.  Neurological:     Mental Status: He is alert.     Comments: Snoring/unresponsive verbally/does not open eyes to voice. Will open eyes w noxious stimuli, and will move bil extremities with stimuli. Does not follow commands.    Psychiatric:     Comments: Altered.      ED Results / Procedures / Treatments   Labs (all labs ordered are listed, but only abnormal results are displayed) Results for orders placed or performed during the hospital encounter of 05/13/22   Comprehensive metabolic panel  Result Value Ref Range   Sodium 136 135 - 145 mmol/L   Potassium 3.6 3.5 - 5.1 mmol/L   Chloride 105 98 - 111 mmol/L   CO2 24 22 - 32 mmol/L   Glucose, Bld 87 70 - 99 mg/dL   BUN 18 8 - 23 mg/dL   Creatinine, Ser 0.99 0.61 - 1.24 mg/dL   Calcium 9.0 8.9 - 10.3 mg/dL   Total Protein  6.8 6.5 - 8.1 g/dL   Albumin 3.4 (L) 3.5 - 5.0 g/dL   AST 23 15 - 41 U/L   ALT 16 0 - 44 U/L   Alkaline Phosphatase 87 38 - 126 U/L   Total Bilirubin 0.8 0.3 - 1.2 mg/dL   GFR, Estimated >60 >60 mL/min   Anion gap 7 5 - 15  CBC  Result Value Ref Range   WBC 5.5 4.0 - 10.5 K/uL   RBC 4.03 (L) 4.22 - 5.81 MIL/uL   Hemoglobin 9.8 (L) 13.0 - 17.0 g/dL   HCT 32.6 (L) 39.0 - 52.0 %   MCV 80.9 80.0 - 100.0 fL   MCH 24.3 (L) 26.0 - 34.0 pg   MCHC 30.1 30.0 - 36.0 g/dL   RDW 17.9 (H) 11.5 - 15.5 %   Platelets 164 150 - 400 K/uL   nRBC 0.0 0.0 - 0.2 %  Rapid urine drug screen (hospital performed)  Result Value Ref Range   Opiates POSITIVE (A) NONE DETECTED   Cocaine NONE DETECTED NONE DETECTED   Benzodiazepines POSITIVE (A) NONE DETECTED   Amphetamines NONE DETECTED NONE DETECTED   Tetrahydrocannabinol NONE DETECTED NONE DETECTED   Barbiturates NONE DETECTED NONE DETECTED  Ethanol  Result Value Ref Range   Alcohol, Ethyl (B) <10 <10 mg/dL  Acetaminophen level  Result Value Ref Range   Acetaminophen (Tylenol), Serum <10 (L) 10 - 30 ug/mL  Salicylate level  Result Value Ref Range   Salicylate Lvl <1.3 (L) 7.0 - 30.0 mg/dL  Blood gas, arterial (at [facility])  Result Value Ref Range   pH, Arterial 7.37 7.35 - 7.45   pCO2 arterial 45 32 - 48 mmHg   pO2, Arterial 136 (H) 83 - 108 mmHg   Bicarbonate 26.0 20.0 - 28.0 mmol/L   Acid-Base Excess 0.5 0.0 - 2.0 mmol/L   O2 Saturation 100 %   Patient temperature 37.0    Allens test (pass/fail) PASS PASS  POC CBG, ED  Result Value Ref Range   Glucose-Capillary 78 70 - 99 mg/dL   CT CHEST ABDOMEN PELVIS W CONTRAST  Result  Date: 05/12/2022 CLINICAL DATA:  Left posterolateral rib pain.  Pain at ostomy site. EXAM: CT CHEST, ABDOMEN, AND PELVIS WITH CONTRAST TECHNIQUE: Multidetector CT imaging of the chest, abdomen and pelvis was performed following the standard protocol during bolus administration of intravenous contrast. RADIATION DOSE REDUCTION: This exam was performed according to the departmental dose-optimization program which includes automated exposure control, adjustment of the mA and/or kV according to patient size and/or use of iterative reconstruction technique. CONTRAST:  162m OMNIPAQUE IOHEXOL 300 MG/ML  SOLN COMPARISON:  CT chest/abdomen/pelvis 04/29/2022, CT abdomen/pelvis 04/28/2019 FINDINGS: CT CHEST FINDINGS Cardiovascular: The heart size is normal. There is no pericardial effusion. The major vasculature of the chest is normal. A left chest wall cardiac device is in place with leads terminating in the right atrium and right ventricle. A right-sided IJ vascular catheter is in place terminating in the lower SVC/cavoatrial junction. Mediastinum/Nodes: The thyroid is unremarkable. The esophagus is grossly unremarkable. There is no mediastinal, hilar, or axillary lymphadenopathy. Lungs/Pleura: The trachea and central airways are patent. The lungs well inflated. Subpleural scarring in the lateral right lower lobe is unchanged. There is no other focal airspace disease. There is no pulmonary edema. There is no pleural effusion or pneumothorax. There are no suspicious nodules. Musculoskeletal: There is no acute rib fracture. There is no other acute osseous abnormality. There is no suspicious osseous lesion.  CT ABDOMEN PELVIS FINDINGS Hepatobiliary: The liver and gallbladder are unremarkable. There is no biliary ductal dilatation. Pancreas: Unremarkable. Spleen: Unremarkable. Adrenals/Urinary Tract: The adrenals are unremarkable. The kidneys are unremarkable, with no focal lesion, renal or ureteral stone, hydronephrosis, or  hydroureter. The small stones layering dependently in the bladder are unchanged. There is symmetric excretion of contrast into the collecting systems on the delayed images. Stomach/Bowel: The stomach is unremarkable. There is no evidence of bowel obstruction. Postsurgical changes reflecting partial colectomy with anastomosis in the right lower quadrant and right lower quadrant diverting loop ileostomy are again seen. Inflammatory changes in the right lower quadrant are overall similar to the prior study from 04/29/2022 and 04/27/2022. There is no organized or drainable fluid collection. There is no free intraperitoneal air. There is no other abnormal bowel wall thickening or inflammatory change. There are a few colonic diverticuli in the remaining colon without evidence of acute diverticulitis. Vascular/Lymphatic: Scattered calcified atherosclerotic plaque is seen in the nonaneurysmal abdominal aorta. The major branch vessels are patent. The main portal and splenic veins are patent. There is no abdominal or pelvic lymphadenopathy. Reproductive: The prostate and seminal vesicles are unremarkable. Other: There is no ascites or free air. Postsurgical changes along the midline anterior abdominal wall are stable. Musculoskeletal: There is no acute osseous abnormality or suspicious osseous lesion. IMPRESSION: 1. Overall, no significant interval change since the CT chest/abdomen/pelvis from 04/29/2022. 2. Stable inflammatory change in the right lower quadrant in the region of the anastomosis and diverting ileostomy. No evidence of bowel obstruction or other complicating feature at the anastomotic site or ostomy. 3. Unchanged small stones layering dependently in the bladder. 4. No acute pathology in the chest. Electronically Signed   By: Valetta Mole M.D.   On: 05/12/2022 12:06   CT Angio Chest PE W and/or Wo Contrast  Result Date: 04/29/2022 CLINICAL DATA:  Positive D-dimers. Assess for pulmonary embolus. Acute  nonlocalized abdomen pain. EXAM: CT ANGIOGRAPHY CHEST CT ABDOMEN AND PELVIS WITH CONTRAST TECHNIQUE: Multidetector CT imaging of the chest was performed using the standard protocol during bolus administration of intravenous contrast. Multiplanar CT image reconstructions and MIPs were obtained to evaluate the vascular anatomy. Multidetector CT imaging of the abdomen and pelvis was performed using the standard protocol during bolus administration of intravenous contrast. RADIATION DOSE REDUCTION: This exam was performed according to the departmental dose-optimization program which includes automated exposure control, adjustment of the mA and/or kV according to patient size and/or use of iterative reconstruction technique. CONTRAST:  127m OMNIPAQUE IOHEXOL 350 MG/ML SOLN COMPARISON:  CT abdomen and pelvis April 27, 2022, abdomen pelvic CT September 06, 2018 FINDINGS: CTA CHEST FINDINGS Cardiovascular: Satisfactory opacification of the pulmonary arteries to the segmental level. No evidence of pulmonary embolism. Normal heart size. No pericardial effusion. Mediastinum/Nodes: No enlarged mediastinal, hilar, or axillary lymph nodes. Thyroid gland, trachea, and esophagus demonstrate no significant findings. Lungs/Pleura: There is no focal pneumonia or pulmonary edema. Minimal bilateral pleural effusions are noted. Stable lateral right lower lobe subpleural scar is unchanged compared to prior exams. Minimal dependent atelectasis of posterior lung bases are noted. Musculoskeletal: Degenerative joint changes of the spine are noted. Review of the MIP images confirms the above findings. CT ABDOMEN and PELVIS FINDINGS Hepatobiliary: No focal liver abnormality is seen. No gallstones, gallbladder wall thickening, or biliary dilatation. Pancreas: Unremarkable. No pancreatic ductal dilatation or surrounding inflammatory changes. Spleen: Normal in size without focal abnormality. Adrenals/Urinary Tract: The bilateral adrenal glands  are normal. No renal calculi  is identified. No focal kidney lesion is noted. No hydronephrosis bilaterally. Dependent bladder calculi identified in a partially decompressed bladder. Stomach/Bowel: A diverting ileostomy is identified in the right lower quadrant. There is evidence of previous partial right colon resection with ileocolonic anastomosis and left hemi colectomy with anastomosis between transverse and sigmoid colon. The surrounding inflammatory change of the right lower quadrant is not significantly changed compared to prior CT of April 27, 2022. the stomach is normal. No free air is identified. Vascular/Lymphatic: Aortic atherosclerosis. No enlarged abdominal or pelvic lymph nodes. Reproductive: Calcifications identified within the prostate gland measuring 5 cm in diameter. Other: Stable postsurgical changes in the anterior abdominal wall compared to prior exam of April 27, 2022. Musculoskeletal: No acute abnormality. Review of the MIP images confirms the above findings. IMPRESSION: 1. No pulmonary embolus. 2. Minimal bilateral pleural effusions with minimal dependent atelectasis of posterior lung bases. 3. No acute abnormality identified in the abdomen and pelvis. 4. Evidence of previous partial right colon resection with ileocolonic anastomosis and left hemi colectomy with anastomosis between transverse and sigmoid colon. The surrounding inflammatory change of the right lower quadrant is not significantly changed compared to prior CT of April 27, 2022. 5. Aortic atherosclerosis. Aortic Atherosclerosis (ICD10-I70.0). Electronically Signed   By: Abelardo Diesel M.D.   On: 04/29/2022 14:45   CT ABDOMEN PELVIS W CONTRAST  Result Date: 04/29/2022 CLINICAL DATA:  Positive D-dimers. Assess for pulmonary embolus. Acute nonlocalized abdomen pain. EXAM: CT ANGIOGRAPHY CHEST CT ABDOMEN AND PELVIS WITH CONTRAST TECHNIQUE: Multidetector CT imaging of the chest was performed using the standard protocol  during bolus administration of intravenous contrast. Multiplanar CT image reconstructions and MIPs were obtained to evaluate the vascular anatomy. Multidetector CT imaging of the abdomen and pelvis was performed using the standard protocol during bolus administration of intravenous contrast. RADIATION DOSE REDUCTION: This exam was performed according to the departmental dose-optimization program which includes automated exposure control, adjustment of the mA and/or kV according to patient size and/or use of iterative reconstruction technique. CONTRAST:  153m OMNIPAQUE IOHEXOL 350 MG/ML SOLN COMPARISON:  CT abdomen and pelvis April 27, 2022, abdomen pelvic CT September 06, 2018 FINDINGS: CTA CHEST FINDINGS Cardiovascular: Satisfactory opacification of the pulmonary arteries to the segmental level. No evidence of pulmonary embolism. Normal heart size. No pericardial effusion. Mediastinum/Nodes: No enlarged mediastinal, hilar, or axillary lymph nodes. Thyroid gland, trachea, and esophagus demonstrate no significant findings. Lungs/Pleura: There is no focal pneumonia or pulmonary edema. Minimal bilateral pleural effusions are noted. Stable lateral right lower lobe subpleural scar is unchanged compared to prior exams. Minimal dependent atelectasis of posterior lung bases are noted. Musculoskeletal: Degenerative joint changes of the spine are noted. Review of the MIP images confirms the above findings. CT ABDOMEN and PELVIS FINDINGS Hepatobiliary: No focal liver abnormality is seen. No gallstones, gallbladder wall thickening, or biliary dilatation. Pancreas: Unremarkable. No pancreatic ductal dilatation or surrounding inflammatory changes. Spleen: Normal in size without focal abnormality. Adrenals/Urinary Tract: The bilateral adrenal glands are normal. No renal calculi is identified. No focal kidney lesion is noted. No hydronephrosis bilaterally. Dependent bladder calculi identified in a partially decompressed bladder.  Stomach/Bowel: A diverting ileostomy is identified in the right lower quadrant. There is evidence of previous partial right colon resection with ileocolonic anastomosis and left hemi colectomy with anastomosis between transverse and sigmoid colon. The surrounding inflammatory change of the right lower quadrant is not significantly changed compared to prior CT of April 27, 2022. the stomach is normal. No free  air is identified. Vascular/Lymphatic: Aortic atherosclerosis. No enlarged abdominal or pelvic lymph nodes. Reproductive: Calcifications identified within the prostate gland measuring 5 cm in diameter. Other: Stable postsurgical changes in the anterior abdominal wall compared to prior exam of April 27, 2022. Musculoskeletal: No acute abnormality. Review of the MIP images confirms the above findings. IMPRESSION: 1. No pulmonary embolus. 2. Minimal bilateral pleural effusions with minimal dependent atelectasis of posterior lung bases. 3. No acute abnormality identified in the abdomen and pelvis. 4. Evidence of previous partial right colon resection with ileocolonic anastomosis and left hemi colectomy with anastomosis between transverse and sigmoid colon. The surrounding inflammatory change of the right lower quadrant is not significantly changed compared to prior CT of April 27, 2022. 5. Aortic atherosclerosis. Aortic Atherosclerosis (ICD10-I70.0). Electronically Signed   By: Abelardo Diesel M.D.   On: 04/29/2022 14:45   DG Chest Portable 1 View  Result Date: 04/29/2022 CLINICAL DATA:  Generalized weakness with chest pain EXAM: PORTABLE CHEST 1 VIEW COMPARISON:  04/09/2022 FINDINGS: Dual-chamber pacer leads from the left in unchanged position, including outward directed right atrial lead. Central line on the right with tip at the upper cavoatrial junction. Artifact from EKG leads. There is no edema, consolidation, effusion, or pneumothorax. IMPRESSION: No acute finding when compared to 04/09/2022  Electronically Signed   By: Jorje Guild M.D.   On: 04/29/2022 12:29   CT Abdomen Pelvis W Contrast  Result Date: 04/27/2022 CLINICAL DATA:  Abdominal pain, acute, nonlocalized. Progressive abdominal pain with nausea and vomiting. History of Crohn's disease and diverticulitis with multiple previous bowel surgeries. EXAM: CT ABDOMEN AND PELVIS WITH CONTRAST TECHNIQUE: Multidetector CT imaging of the abdomen and pelvis was performed using the standard protocol following bolus administration of intravenous contrast. RADIATION DOSE REDUCTION: This exam was performed according to the departmental dose-optimization program which includes automated exposure control, adjustment of the mA and/or kV according to patient size and/or use of iterative reconstruction technique. CONTRAST:  163m OMNIPAQUE IOHEXOL 300 MG/ML  SOLN COMPARISON:  Abdominopelvic CT 03/13/2022 and 01/24/2022. FINDINGS: Lower chest: Stable subpleural scarring laterally at the right lung base. No significant pleural or pericardial effusion. 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 There is mild distal esophageal wall thickening. Hepatobiliary: The liver is normal in density without suspicious focal abnormality. No evidence of gallstones, gallbladder wall thickening or biliary dilatation. Pancreas: Unremarkable. No pancreatic ductal dilatation or surrounding inflammatory changes. Spleen: Normal in size without focal abnormality. Adrenals/Urinary Tract: Both adrenal glands appear normal. No evidence of renal or ureteral calculus. Both kidneys appear normal. No hydronephrosis or delayed contrast excretion. As before, possible dependent right-sided bladder calculi. Possible mild bladder wall thickening without other focal abnormality. Stomach/Bowel: No enteric contrast administered. As above, mild distal esophageal wall thickening. The stomach appears unremarkable for its degree of distention. The stomach appears normal for its degree of  distention. No small bowel distension or wall thickening is identified. No obvious interval bowel surgery. There is a diverting ileostomy in the right lower quadrant. Evidence of previous partial right colon resection with ileocolonic anastomosis as well as left hemicolectomy with an anastomosis between the transverse and the sigmoid colon. The surrounding inflammatory changes seen in the right lower quadrant on the most recent study have improved. No suspicious extraluminal fluid or definite extraluminal air collections. Vascular/Lymphatic: There are no enlarged abdominal or pelvic lymph nodes. Mild aortic and branch vessel atherosclerosis without evidence of aneurysm or large vessel occlusion. Reproductive: Stable mild enlargement of the prostate gland. Other: Previously  demonstrated large fluid collection within the anterior abdominal wall has nearly completely resolved. Otherwise stable postsurgical changes in the anterior abdominal wall. No enlarging intra-abdominal fluid collections or progressive inflammatory changes. No residual pneumoperitoneum. Musculoskeletal: No acute or significant osseous findings. The sacroiliac joints appear unremarkable. IMPRESSION: 1. No acute findings or clear explanation for the patient's symptoms. 2. Interval improvement in previously demonstrated inflammatory changes in the right lower quadrant of the abdomen with near complete resolution of the large fluid collection in the anterior abdominal wall. No evidence of bowel obstruction or perforation. 3. Stable postsurgical changes as described with a diverting ileostomy in the right lower quadrant. 4. Possible small dependent bladder calculi, as before. 5.  Aortic Atherosclerosis (ICD10-I70.0). Electronically Signed   By: Richardean Sale M.D.   On: 04/27/2022 15:09     EKG EKG Interpretation  Date/Time:  Saturday May 13 2022 11:51:12 EDT Ventricular Rate:  75 PR Interval:  159 QRS Duration: 100 QT Interval:  416 QTC  Calculation: 465 R Axis:   -59 Text Interpretation: Sinus rhythm Incomplete RBBB and LAFB Confirmed by Lajean Saver [phone]) on 05/13/2022 12:05:53 PM  Radiology CT CHEST ABDOMEN PELVIS W CONTRAST  Result Date: 05/12/2022 CLINICAL DATA:  Left posterolateral rib pain.  Pain at ostomy site. EXAM: CT CHEST, ABDOMEN, AND PELVIS WITH CONTRAST TECHNIQUE: Multidetector CT imaging of the chest, abdomen and pelvis was performed following the standard protocol during bolus administration of intravenous contrast. RADIATION DOSE REDUCTION: This exam was performed according to the departmental dose-optimization program which includes automated exposure control, adjustment of the mA and/or kV according to patient size and/or use of iterative reconstruction technique. CONTRAST:  117m OMNIPAQUE IOHEXOL 300 MG/ML  SOLN COMPARISON:  CT chest/abdomen/pelvis 04/29/2022, CT abdomen/pelvis 04/28/2019 FINDINGS: CT CHEST FINDINGS Cardiovascular: The heart size is normal. There is no pericardial effusion. The major vasculature of the chest is normal. A left chest wall cardiac device is in place with leads terminating in the right atrium and right ventricle. A right-sided IJ vascular catheter is in place terminating in the lower SVC/cavoatrial junction. Mediastinum/Nodes: The thyroid is unremarkable. The esophagus is grossly unremarkable. There is no mediastinal, hilar, or axillary lymphadenopathy. Lungs/Pleura: The trachea and central airways are patent. The lungs well inflated. Subpleural scarring in the lateral right lower lobe is unchanged. There is no other focal airspace disease. There is no pulmonary edema. There is no pleural effusion or pneumothorax. There are no suspicious nodules. Musculoskeletal: There is no acute rib fracture. There is no other acute osseous abnormality. There is no suspicious osseous lesion. CT ABDOMEN PELVIS FINDINGS Hepatobiliary: The liver and gallbladder are unremarkable. There is no biliary ductal  dilatation. Pancreas: Unremarkable. Spleen: Unremarkable. Adrenals/Urinary Tract: The adrenals are unremarkable. The kidneys are unremarkable, with no focal lesion, renal or ureteral stone, hydronephrosis, or hydroureter. The small stones layering dependently in the bladder are unchanged. There is symmetric excretion of contrast into the collecting systems on the delayed images. Stomach/Bowel: The stomach is unremarkable. There is no evidence of bowel obstruction. Postsurgical changes reflecting partial colectomy with anastomosis in the right lower quadrant and right lower quadrant diverting loop ileostomy are again seen. Inflammatory changes in the right lower quadrant are overall similar to the prior study from 04/29/2022 and 04/27/2022. There is no organized or drainable fluid collection. There is no free intraperitoneal air. There is no other abnormal bowel wall thickening or inflammatory change. There are a few colonic diverticuli in the remaining colon without evidence of acute diverticulitis. Vascular/Lymphatic: Scattered  calcified atherosclerotic plaque is seen in the nonaneurysmal abdominal aorta. The major branch vessels are patent. The main portal and splenic veins are patent. There is no abdominal or pelvic lymphadenopathy. Reproductive: The prostate and seminal vesicles are unremarkable. Other: There is no ascites or free air. Postsurgical changes along the midline anterior abdominal wall are stable. Musculoskeletal: There is no acute osseous abnormality or suspicious osseous lesion. IMPRESSION: 1. Overall, no significant interval change since the CT chest/abdomen/pelvis from 04/29/2022. 2. Stable inflammatory change in the right lower quadrant in the region of the anastomosis and diverting ileostomy. No evidence of bowel obstruction or other complicating feature at the anastomotic site or ostomy. 3. Unchanged small stones layering dependently in the bladder. 4. No acute pathology in the chest.  Electronically Signed   By: Valetta Mole M.D.   On: 05/12/2022 12:06    Procedures Procedures    Medications Ordered in ED Medications  naloxone (NARCAN) injection 1 mg (1 mg Intravenous Given 05/13/22 1200)  dextrose 50 % solution 25 g (25 g Intravenous Given 05/13/22 1206)    ED Course/ Medical Decision Making/ A&P                           Medical Decision Making Problems Addressed: Chronic anemia: chronic illness or injury that poses a threat to life or bodily functions Crohn's disease with other complication, unspecified gastrointestinal tract location [facility]): chronic illness or injury that poses a threat to life or bodily functions Generalized weakness: acute illness or injury with systemic symptoms that poses a threat to life or bodily functions Metabolic encephalopathy: acute illness or injury with systemic symptoms that poses a threat to life or bodily functions  Amount and/or Complexity of Data Reviewed Independent Historian: EMS    Details: hx External Data Reviewed: labs, radiology and notes. Labs: ordered. Decision-making details documented in ED Course. Radiology: ordered and independent interpretation performed. Decision-making details documented in ED Course. ECG/medicine tests: ordered and independent interpretation performed. Decision-making details documented in ED Course. Discussion of management or test interpretation with external provider(s): Hospitalists, discussed pt  Risk Prescription drug management. Decision regarding hospitalization.   Iv ns. Continuous pulse ox and cardiac monitoring. Labs ordered/sent. Imaging ordered.   Reviewed nursing notes and prior charts for additional history. External reports reviewed. Additional history from: EMS.   Narcan iv. Cbg. D50.   Cardiac monitor: sinus rhythm, rate 80.  Labs reviewed/interpreted by me - chem normal.   Xrays reviewed/interpreted by me - no pna.   CT reviewed/interpreted by me - no hem.    Given altered mental status/metabolic encephalopathy, suspect from polypharmacy ?intentional overdose vs overuse of meds - will admit to hospitalists.   Multiple rechecks. Abd soft nt. Chest cta. Pt slowly becoming easier to arouse, less lethargic. Still not able to indicate if took meds or able to identify other new symptoms.   CRITICAL CARE RRE: acute, severe alteration in mental status, encephalopathy, possible overdose Performed by: Mirna Mires Total critical care time: 120 minutes Critical care time was exclusive of separately billable procedures and treating other patients. Critical care was necessary to treat or prevent imminent or life-threatening deterioration. Critical care was time spent personally by me on the following activities: development of treatment plan with patient and/or surrogate as well as nursing, discussions with consultants, evaluation of patient's response to treatment, examination of patient, obtaining history from patient or surrogate, ordering and performing treatments and interventions, ordering and review of  laboratory studies, ordering and review of radiographic studies, pulse oximetry and re-evaluation of patient's condition.  Hospitalists consulted for admission, discussed w Dr Thereasa Solo - will admit.            Final Clinical Impression(s) / ED Diagnoses Final diagnoses:  None    Rx / DC Orders ED Discharge Orders     None         Lajean Saver, MD 05/13/22 [phone]

## 2022-05-13 NOTE — H&P (Signed)
History and Physical   [name] IRS:[id number] DOB: 1951/03/02 DOA: 05/13/2022  PCP: [name], MD Patient coming from: home   Chief Complaint: altered mental status   HPI:  71yo w/ a hx of Crohn's disease resulting in a complicated surgical history followed at [facility] to include left hemicolectomy and ileostomy with multiple other complications to include TNA dependence, chronic prostatitis, SVT, and asthma who was transported to the ER from his SNF ([facility]) the day prior to this admit w/ generalized weakness and acute worsening of his chronic abdom pain. He decided to return home instead of his SNF, and then this morning at home was found by his wife in an unresponsive state with multiple of his pill bottles open around him (name of pills and how amny missing unknown). EMS was summoned, and the patient was brought back to the ED 10/28. EMS confirmed normal CBG. Narcan given in the ED had no appreciable effect.  CT head was without acute findings.  CXR unrevealing.  He had a CT abdomen/pelvis 10/27 which revealed no acute findings.  UA unrevealing.  Ethanol level undetectable.  Acetaminophen and aspirin levels undetectable.  ABG without significant derangement.  UDS positive for opiates and benzos, both of which he is prescribed.  Afebrile.  Vital signs stable.  Of note the patient was admitted to the hospital 10/14 >05/03/2022 after treatment for hypotension abdominal pain and complications related to accidentally receiving a TNA bolus at his SNF.  At the time of my visit the patient is fully obtunded.  He does not respond to voice or tactile stimuli.  He makes no purposeful movements.  He is in no respiratory distress whatsoever and his vitals are perfectly stable at the time of my visit, to include his oxygen saturation.  His pupils are dilated but constrict with light and are equal bilaterally.  There is no Babinski and no posturing.  Assessment/Plan  Obtundation of unclear  etiology POA History suggest possible drug overdose (intentional versus unintentional) -other than obtundation the patient is remarkably stable otherwise - CT head unrevealing -monitor with supportive care - Narcan had no effect in the ER - and the patient is otherwise stable will avoid Romazicon as patient appears to chronically be on Valium - check EEG to r/o occult seizure, though I have very low suspicion   Crohn's disease with complex surgical history including diverting loop ileostomy April 2023 Abdom benign on exam - no apparent issues w/ ostomy - monitor clinically   Severe protein calorie malnutrition On chronic TNA - hold for nor until clinical picture more clear - has a L chest central line   Recent dysuria with chronic prostatitis/BPH Currently receiving a 28-day course of Bactrim DS - continue Flomax when able to to take orals  Bladder calculi To follow-up with urology in the outpatient setting to consider transurethral calculi removal  Normocytic anemia Monitor Hgb trend   S/P PPM  GERD  DVT prophylaxis: Lovenox Code Status: Full Disposition Plan: Admit  Review of Systems: As per HPI otherwise 10 point review of systems negative.   Past Medical History:  Diagnosis Date   Acute prostatitis 07/24/2007   Qualifier: Diagnosis of  By: [name] MD, [name]    Allergy    mild   Arthritis    osteoarthritis   Asthma    Blood transfusion without reported diagnosis    BPH (benign prostatic hypertrophy) with urinary obstruction    Crohn's ileitis ([place]) suspected 05/03/2017   Dilated aortic  root ([place])    noted on echo 08/2012   Diverticulitis of colon    EPIDIDYMITIS 02/15/2010   Qualifier: Diagnosis of  By: [name] MD, [name]    GERD (gastroesophageal reflux disease)    H/O: GI bleed    Hemorrhoids    Hepatitis 1975   unknown type    HERPES SIMPLEX INFECTION 10/14/2007   Qualifier: Diagnosis of  By: [name] MD, [name]    Hiatal hernia    Ileus following gastrointestinal  surgery ([place]) 12/26/2011   Long term (current) use of systemic steroids 06/18/2018   Psoriasis    sees [name] at [facility].   Recurrent ventral incisional hernia 05/10/2012   SVT (supraventricular tachycardia)    Ulcer 08/21/2013   ileal    Past Surgical History:  Procedure Laterality Date   BOWEL RESECTION  12/19/2011   Procedure: SMALL BOWEL RESECTION;  Surgeon: [name], MD;  Location: [facility];  Service: General;  Laterality: N/A;  with anastamosis and insertion mesh   BRONCHOSCOPY     COLON SURGERY  01/2004   x 2   COLONOSCOPY W/ BIOPSIES  04/26/2017   per [name], no polyps, benign inflammation, repeat in 5 yrs    CYSTOSCOPY     ESOPHAGOGASTRODUODENOSCOPY     HEMICOLECTOMY     left side, at [facility], diverticulitis   [place]     [phone] incisional hernia   ILEOSTOMY     ILEOSTOMY CLOSURE     INSERTION OF MESH  07/31/2012   Procedure: INSERTION OF MESH;  Surgeon: [name], MD;  Location: [facility];  Service: General;;   LAPAROTOMY  12/19/2011   Procedure: EXPLORATORY LAPAROTOMY;  Surgeon: [name], MD;  Location: [facility];  Service: General;  Laterality: N/A;   PACEMAKER IMPLANT N/A 12/01/2020   Procedure: PACEMAKER IMPLANT;  Surgeon: [name], MD;  Location: [place] [facility];  Service: Cardiovascular;  Laterality: N/A;   PACEMAKER INSERTION Left    TONSILLECTOMY     UPPER GASTROINTESTINAL ENDOSCOPY     VASECTOMY     VENTRAL HERNIA REPAIR  07/31/2012   Procedure: HERNIA REPAIR VENTRAL ADULT;  Surgeon: [name], MD;  Location: [facility];  Service: General;  Laterality: N/A;    Family History  Family History  Problem Relation Age of Onset   Lung cancer Mother    Leukemia Father    Hypertension Father    Heart disease Father    Heart attack Father    Prostate cancer Father    Prostate cancer Paternal Uncle    Colon cancer Neg Hx    Stomach cancer Neg Hx    Colon polyps Neg  Hx    Esophageal cancer Neg Hx    Rectal cancer Neg Hx     Social History   reports that he has never smoked. He has never used smokeless tobacco. He reports that he does not currently use alcohol. He reports current drug use. Drug: Oxycodone.  Allergies Allergies  Allergen Reactions   Mercaptopurine Other (See Comments)    Pancreatitis    Shellfish Allergy Anaphylaxis    Throat started to close   Humira [Adalimumab]     Developed antibodies   Wound Dressing Adhesive Rash    Prior to Admission medications   Medication Sig Start Date End Date Taking? Authorizing Provider  acetaminophen (TYLENOL) 325 MG tablet Take 650 mg by mouth every 6 (six) hours  as needed for moderate pain.    [provider]  albuterol (PROVENTIL HFA;VENTOLIN HFA) 108 (90 Base) MCG/ACT inhaler INHALE 2 PUFFS INTO THE LUNGS EVERY 4 (FOUR) HOURS AS NEEDED FOR WHEEZING OR SHORTNESS OF BREATH. Patient taking differently: Inhale 2 puffs into the lungs every 6 (six) hours as needed for wheezing or shortness of breath. 10/24/18   [name], MD  Amino Ac Elect-Calc in D15W [facility] E/DEXTROSE, 5/15, IV) Inject 65 mL/hr into the vein continuous. Via powerline TPN IV (104gm aa/183 GM DEC/50GM)    [provider]  busPIRone (BUSPAR) 5 MG tablet Take 5 mg by mouth 3 (three) times daily.    [provider]  calcium carbonate (TUMS - DOSED IN MG ELEMENTAL CALCIUM) 500 MG chewable tablet Chew 500 mg by mouth 3 (three) times daily as needed for indigestion or heartburn.    [provider]  melatonin 3 MG TABS tablet Take 3 mg by mouth at bedtime.    [provider]  morphine (MSIR) 15 MG tablet Take 15 mg by mouth every 4 (four) hours as needed for severe pain.    [provider]  Multiple Vitamin (MULTIVITAMIN WITH MINERALS) TABS tablet Take 1 tablet by mouth daily.    [provider]  omeprazole (PRILOSEC OTC) 20 MG tablet Take 20 mg by mouth daily.    [provider]  ondansetron (ZOFRAN-ODT) 4 MG disintegrating tablet Take 4 mg by mouth every 6 (six) hours as needed for nausea or vomiting.    [provider]  OVER THE COUNTER MEDICATION Take 1 Bottle by mouth in the morning and at bedtime. Medpass 113m supplement    [provider]  oxybutynin (DITROPAN) 5 MG tablet Take 1 tablet (5 mg total) by mouth 3 (three) times daily. 05/03/22   Shalhoub, GSherryll Burger MD  psyllium (HYDROCIL/METAMUCIL) 95 % PACK Take 1 packet by mouth daily.    [provider]  simethicone (MYLICON) 80 MG chewable tablet Chew 80 mg by mouth 4 (four) times daily -  with meals and at bedtime.    [provider]  sulfamethoxazole-trimethoprim (BACTRIM DS) 800-160 MG tablet Take 1 tablet by mouth every 12 (twelve) hours for 25 days. 05/03/22 05/28/22  Shalhoub, GSherryll Burger MD  tamsulosin (FLOMAX) 0.4 MG CAPS capsule Take 1 capsule (0.4 mg total) by mouth daily after supper. 05/03/22   Shalhoub, GSherryll Burger MD  traZODone (DESYREL) 50 MG tablet Take 100 mg by mouth at bedtime.    [provider]    Physical Exam: Vitals:   05/13/22 1230 05/13/22 1245 05/13/22 1300 05/13/22 1330  BP: (!) 159/65 (!) 130/58 110/62 116/65  Pulse: 80 81 78 72  Resp: 19 19 19  (!) 21  Temp:      TempSrc:      SpO2: 100% 100% 100% 100%    Constitutional: fully obtunded  Eyes: PERRL, lids and conjunctivae normal ENMT: Mucous membranes are dry - no abnormalities of oral cavity/pharynx  Neck: normal, supple, no masses, no thyromegaly Respiratory: clear to auscultation bilaterally, no wheezing, no crackles. Normal respiratory effort. No accessory muscle use.  Cardiovascular: Regular rate and rhythm, no murmurs / rubs / gallops. No extremity edema. 2+ pedal pulses. No carotid bruits.  Abdomen: no tenderness, no masses palpated. No hepatosplenomegaly. Bowel sounds positive. Ostomy in R abdom w/o apparent complication.  Musculoskeletal: no clubbing / cyanosis.  No joint deformity upper and lower extremities. Good ROM, no contractures. Normal muscle tone.  Skin:  no rashes, lesions, ulcers. No induration Neurologic: no facial asymmetry - no babinski - no posturing - extremities flaccid w/o evidence   Labs on Admission:   CBC: Recent Labs  Lab 05/12/22 0943 05/13/22 1212  WBC 9.2 5.5  NEUTROABS 7.1  --   HGB 9.6* 9.8*  HCT 31.6* 32.6*  MCV 80.6 80.9  PLT 160 466   Basic Metabolic Panel: Recent Labs  Lab 05/12/22 0943 05/13/22 1212  NA 134* 136  K 3.9 3.6  CL 107 105  CO2 22 24  GLUCOSE 92 87  BUN 25* 18  CREATININE 0.94 0.99  CALCIUM 8.5* 9.0  MG 1.8  --   PHOS 3.0  --    GFR: Estimated Creatinine Clearance: 71.7 mL/min (by C-G formula based on SCr of 0.99 mg/dL).  CBG: Recent Labs  Lab 05/13/22 1200  GLUCAP 78   Urine analysis:    Component Value Date/Time   COLORURINE YELLOW 05/12/2022 1116   APPEARANCEUR CLEAR 05/12/2022 1116   LABSPEC 1.019 05/12/2022 1116   PHURINE 5.0 05/12/2022 1116   GLUCOSEU NEGATIVE 05/12/2022 1116   HGBUR NEGATIVE 05/12/2022 1116   HGBUR negative 02/15/2010 1105   BILIRUBINUR NEGATIVE 05/12/2022 1116   BILIRUBINUR neg 10/04/2021 [place] 05/12/2022 1116   PROTEINUR NEGATIVE 05/12/2022 1116   UROBILINOGEN 1.0 10/04/2021 1015   UROBILINOGEN 0.2 12/19/2011 1101   NITRITE NEGATIVE 05/12/2022 1116   LEUKOCYTESUR NEGATIVE 05/12/2022 1116   Radiological Exams on Admission: DG Chest Port 1 View  Result Date: 05/13/2022 CLINICAL DATA:  Altered mental status. Patient found unresponsive adjacent to a pill bottle. EXAM: PORTABLE CHEST 1 VIEW COMPARISON:  04/29/2022. FINDINGS: Cardiac silhouette is normal in size. No mediastinal or hilar masses. Stable left anterior chest wall dual lead pacemaker and right anterior chest wall tunneled central venous catheter. Clear lungs.  No pleural effusion or pneumothorax. Skeletal structures are grossly intact. IMPRESSION: No active disease.  Electronically Signed   By: Lajean Manes M.D.   On: 05/13/2022 13:00   CT Head Wo Contrast  Result Date: 05/13/2022 CLINICAL DATA:  Altered mental status starting this morning EXAM: CT HEAD WITHOUT CONTRAST TECHNIQUE: Contiguous axial images were obtained from the base of the skull through the vertex without intravenous contrast. RADIATION DOSE REDUCTION: This exam was performed according to the departmental dose-optimization program which includes automated exposure control, adjustment of the mA and/or kV according to patient size and/or use of iterative reconstruction technique. COMPARISON:  05/27/2020 FINDINGS: Brain: Small likely dystrophic calcifications along the right parietal periventricular white matter adjacent to the margin of the right lateral ventricle (subependymal). These are unchanged from 05/27/2020. Otherwise, the brainstem, cerebellum, cerebral peduncles, thalamus, basal ganglia, basilar cisterns, and ventricular system appear within normal limits. Partially empty sella. No intracranial hemorrhage, mass lesion, or acute CVA. Vascular: There is atherosclerotic calcification of the cavernous carotid arteries bilaterally. Skull: Unremarkable Sinuses/Orbits: Unremarkable Other: No supplemental non-categorized findings. IMPRESSION: 1. No acute intracranial findings. 2. Partially empty sella. 3. Atherosclerosis. 4. Chronically stable small likely dystrophic calcifications along the right parietal periventricular white matter. Electronically Signed   By: Van Clines M.D.   On: 05/13/2022 12:53   CT CHEST ABDOMEN PELVIS W CONTRAST  Result Date: 05/12/2022 CLINICAL DATA:  Left posterolateral rib pain.  Pain at ostomy site. EXAM: CT CHEST, ABDOMEN, AND PELVIS WITH CONTRAST TECHNIQUE: Multidetector CT imaging of the chest, abdomen and pelvis was performed following the standard protocol during bolus administration of intravenous contrast. RADIATION DOSE REDUCTION: This  exam was performed  according to the departmental dose-optimization program which includes automated exposure control, adjustment of the mA and/or kV according to patient size and/or use of iterative reconstruction technique. CONTRAST:  138m OMNIPAQUE IOHEXOL 300 MG/ML  SOLN COMPARISON:  CT chest/abdomen/pelvis 04/29/2022, CT abdomen/pelvis 04/28/2019 FINDINGS: CT CHEST FINDINGS Cardiovascular: The heart size is normal. There is no pericardial effusion. The major vasculature of the chest is normal. A left chest wall cardiac device is in place with leads terminating in the right atrium and right ventricle. A right-sided IJ vascular catheter is in place terminating in the lower SVC/cavoatrial junction. Mediastinum/Nodes: The thyroid is unremarkable. The esophagus is grossly unremarkable. There is no mediastinal, hilar, or axillary lymphadenopathy. Lungs/Pleura: The trachea and central airways are patent. The lungs well inflated. Subpleural scarring in the lateral right lower lobe is unchanged. There is no other focal airspace disease. There is no pulmonary edema. There is no pleural effusion or pneumothorax. There are no suspicious nodules. Musculoskeletal: There is no acute rib fracture. There is no other acute osseous abnormality. There is no suspicious osseous lesion. CT ABDOMEN PELVIS FINDINGS Hepatobiliary: The liver and gallbladder are unremarkable. There is no biliary ductal dilatation. Pancreas: Unremarkable. Spleen: Unremarkable. Adrenals/Urinary Tract: The adrenals are unremarkable. The kidneys are unremarkable, with no focal lesion, renal or ureteral stone, hydronephrosis, or hydroureter. The small stones layering dependently in the bladder are unchanged. There is symmetric excretion of contrast into the collecting systems on the delayed images. Stomach/Bowel: The stomach is unremarkable. There is no evidence of bowel obstruction. Postsurgical changes reflecting partial colectomy with anastomosis in the right lower quadrant  and right lower quadrant diverting loop ileostomy are again seen. Inflammatory changes in the right lower quadrant are overall similar to the prior study from 04/29/2022 and 04/27/2022. There is no organized or drainable fluid collection. There is no free intraperitoneal air. There is no other abnormal bowel wall thickening or inflammatory change. There are a few colonic diverticuli in the remaining colon without evidence of acute diverticulitis. Vascular/Lymphatic: Scattered calcified atherosclerotic plaque is seen in the nonaneurysmal abdominal aorta. The major branch vessels are patent. The main portal and splenic veins are patent. There is no abdominal or pelvic lymphadenopathy. Reproductive: The prostate and seminal vesicles are unremarkable. Other: There is no ascites or free air. Postsurgical changes along the midline anterior abdominal wall are stable. Musculoskeletal: There is no acute osseous abnormality or suspicious osseous lesion. IMPRESSION: 1. Overall, no significant interval change since the CT chest/abdomen/pelvis from 04/29/2022. 2. Stable inflammatory change in the right lower quadrant in the region of the anastomosis and diverting ileostomy. No evidence of bowel obstruction or other complicating feature at the anastomotic site or ostomy. 3. Unchanged small stones layering dependently in the bladder. 4. No acute pathology in the chest. Electronically Signed   By: PValetta MoleM.D.   On: 05/12/2022 12:06     JCherene Altes MD [facility] Office  [phone] - Text Page per Amion as per below:  On-Call/Text Page:      [email or web address]  If 7PM-7AM, please contact night-coverage [email or web address] 05/13/2022, 3:25 PM

## 2022-05-13 NOTE — ED Triage Notes (Signed)
Ems brings pt in from home. Wife states pt is from [place] but took an uber home yesterday. Pt found today unresponsive with open pill bottles.

## 2022-05-13 NOTE — ED Notes (Signed)
Pt's son updated.

## 2022-05-13 NOTE — ED Notes (Signed)
Pt to CT with RN

## 2022-05-14 DIAGNOSIS — R401 Stupor: Secondary | ICD-10-CM | POA: Diagnosis not present

## 2022-05-14 LAB — AMMONIA: Ammonia: 26 umol/L (ref 9–35)

## 2022-05-14 LAB — RETICULOCYTES
Immature Retic Fract: 23.7 % — ABNORMAL HIGH (ref 2.3–15.9)
RBC.: 4.02 MIL/uL — ABNORMAL LOW (ref 4.22–5.81)
Retic Count, Absolute: 79.2 10*3/uL (ref 19.0–186.0)
Retic Ct Pct: 2 % (ref 0.4–3.1)

## 2022-05-14 LAB — CBC
HCT: 32.6 % — ABNORMAL LOW (ref 39.0–52.0)
Hemoglobin: 9.8 g/dL — ABNORMAL LOW (ref 13.0–17.0)
MCH: 24.3 pg — ABNORMAL LOW (ref 26.0–34.0)
MCHC: 30.1 g/dL (ref 30.0–36.0)
MCV: 80.9 fL (ref 80.0–100.0)
Platelets: 180 10*3/uL (ref 150–400)
RBC: 4.03 MIL/uL — ABNORMAL LOW (ref 4.22–5.81)
RDW: 17.8 % — ABNORMAL HIGH (ref 11.5–15.5)
WBC: 8 10*3/uL (ref 4.0–10.5)
nRBC: 0 % (ref 0.0–0.2)

## 2022-05-14 LAB — COMPREHENSIVE METABOLIC PANEL
ALT: 16 U/L (ref 0–44)
AST: 22 U/L (ref 15–41)
Albumin: 3.2 g/dL — ABNORMAL LOW (ref 3.5–5.0)
Alkaline Phosphatase: 90 U/L (ref 38–126)
Anion gap: 6 (ref 5–15)
BUN: 15 mg/dL (ref 8–23)
CO2: 26 mmol/L (ref 22–32)
Calcium: 8.7 mg/dL — ABNORMAL LOW (ref 8.9–10.3)
Chloride: 108 mmol/L (ref 98–111)
Creatinine, Ser: 0.88 mg/dL (ref 0.61–1.24)
GFR, Estimated: >60 mL/min (ref >60)
Glucose, Bld: 106 mg/dL — ABNORMAL HIGH (ref 70–99)
Potassium: 3.6 mmol/L (ref 3.5–5.1)
Sodium: 140 mmol/L (ref 135–145)
Total Bilirubin: 0.6 mg/dL (ref 0.3–1.2)
Total Protein: 6.7 g/dL (ref 6.5–8.1)

## 2022-05-14 LAB — FERRITIN: Ferritin: 22 ng/mL — ABNORMAL LOW (ref 24–336)

## 2022-05-14 LAB — TSH: TSH: 0.436 u[IU]/mL (ref 0.350–4.500)

## 2022-05-14 LAB — CORTISOL: Cortisol, Plasma: 19.4 ug/dL

## 2022-05-14 LAB — PHOSPHORUS: Phosphorus: 2.7 mg/dL (ref 2.5–4.6)

## 2022-05-14 LAB — MAGNESIUM: Magnesium: 2 mg/dL (ref 1.7–2.4)

## 2022-05-14 LAB — IRON AND TIBC
Iron: 18 ug/dL — ABNORMAL LOW (ref 45–182)
Saturation Ratios: 5 % — ABNORMAL LOW (ref 17.9–39.5)
TIBC: 347 ug/dL (ref 250–450)
UIBC: 329 ug/dL

## 2022-05-14 LAB — FOLATE: Folate: 16.8 ng/mL (ref >5.9)

## 2022-05-14 LAB — VITAMIN B12: Vitamin B-12: 301 pg/mL (ref 180–914)

## 2022-05-14 MED ORDER — CYANOCOBALAMIN 1000 MCG/ML IJ SOLN
1000.0000 ug | Freq: Every day | INTRAMUSCULAR | Status: AC
  Administered 2022-05-14 – 2022-05-15 (×2): 1000 ug via SUBCUTANEOUS
  Filled 2022-05-14 (×2): qty 1

## 2022-05-14 NOTE — Progress Notes (Signed)
Patient is still lethargic throughout shift, only responds to pain but not interactive. Family at the bedside visiting, updated with plan of care.

## 2022-05-14 NOTE — Progress Notes (Signed)
MINOR IDEN  SWH:[id number] DOB: 11/10/1950 DOA: 05/13/2022 PCP: [name], MD    Brief Narrative:  [phone] w/ a hx of Crohn's disease resulting in a complicated surgical history followed at [facility] to include left hemicolectomy and ileostomy with multiple other complications to include TNA dependence, chronic prostatitis, SVT, and asthma who was transported to the ER from his SNF ([facility]) the day prior to this admit w/ generalized weakness and acute worsening of his chronic abdom pain. He decided to return home instead of his SNF, and then the morning of this admit was found by his wife at home in an unresponsive state with multiple pill bottles open around him (name of pills and how many missing is unknown). EMS was summoned, and the patient was brought back to the ED 10/28. EMS confirmed normal CBG. Narcan given in the ED had no appreciable effect.  CT head was without acute findings.  CXR unrevealing.  He had a CT abdomen/pelvis 10/27 which revealed no acute findings.  UA unrevealing.  Ethanol level undetectable.  Acetaminophen and aspirin levels undetectable.  ABG without significant derangement.  UDS positive for opiates and benzos, both of which he is prescribed.  Afebrile.  Vital signs stable.   Of note the patient was admitted to the hospital 10/14 >05/03/2022 after treatment for hypotension abdominal pain and complications related to accidentally receiving a TNA bolus at his SNF.  Consultants:  None  Goals of Care:  Code Status: Full Code   DVT prophylaxis: Lovenox  Interim Hx: Afebrile.  Vital signs remain stable.  Saturation 98% room air.  Electrolytes balanced.  Renal function normal.  No change in the patient's level of responsiveness since my evaluation yesterday, but while I am in the room meeting with the family he is spontaneously moving his legs.  He is in no apparent distress.  I met with multiple family members at bedside and we had an extensive discussion.  Family  is not suspicious that there was suicidal ideation or intent but through their investigation feels it is most likely he ended up taking extra doses of trazodone.  Again we cannot be sure of this but this is their best guess.  Assessment & Plan:  Obtundation of unclear etiology POA History suggests possible drug overdose, likely trazodone - other than obtundation the patient remains remarkably stable- CT head unrevealing - Narcan had no effect in the ER - EEG pending - TSH normal - B12 and folate not severely abnormal - ammonia normal -EKG without significant QTc derangement -telemetry thus far unimpressive -appears to be slowly waking up -if fails to significantly improve over the next 24 hours will repeat CT head tomorrow  Modestly low B12 B12 301 - supplement to goal of 400 or >   Crohn's disease with complex surgical history including diverting loop ileostomy April 2023 Abdom benign on exam - no apparent issues w/ ostomy - monitor clinically    Severe protein calorie malnutrition On chronic TNA - has a L chest central line - resume TNA today    Recent dysuria with chronic prostatitis/BPH Was receiving a 28-day course of Bactrim DS prior to his admission - continue Flomax when able to to take orals   Bladder calculi To follow-up with Urology in the outpatient setting to consider transurethral calculi removal   Normocytic anemia Monitor Hgb trend    S/P PPM   GERD  Family Communication: Spoke with the patient's wife and multiple other family members at bedside Disposition: Remain  inpatient until mental status significantly improved -anticipate return to SNF will be indicated  Objective: Blood pressure 112/64, pulse 79, temperature 98.8 F (37.1 C), temperature source Oral, resp. rate 18, weight 67.2 kg, SpO2 98 %.  Intake/Output Summary (Last 24 hours) at 05/14/2022 0937 Last data filed at 05/14/2022 0600 Gross per 24 hour  Intake 1057.94 ml  Output 1050 ml  Net 7.94 ml    Filed Weights   05/13/22 1832  Weight: 67.2 kg    Examination: General: No acute respiratory distress -remains obtunded Lungs: Clear to auscultation bilaterally without wheezes or crackles Cardiovascular: Regular rate and rhythm without murmur gallop or rub normal S1 and S2 Abdomen: Nontender, nondistended, soft, bowel sounds positive, no rebound, no ascites, no appreciable mass Extremities: No significant cyanosis, clubbing, or edema bilateral lower extremities  CBC: Recent Labs  Lab 05/12/22 0943 05/13/22 1212 05/14/22 0427  WBC 9.2 5.5 8.0  NEUTROABS 7.1  --   --   HGB 9.6* 9.8* 9.8*  HCT 31.6* 32.6* 32.6*  MCV 80.6 80.9 80.9  PLT 160 164 180   Basic Metabolic Panel: Recent Labs  Lab 05/12/22 0943 05/13/22 1212 05/14/22 0427  NA 134* 136 140  K 3.9 3.6 3.6  CL 107 105 108  CO2 22 24 26  GLUCOSE 92 87 106*  BUN 25* 18 15  CREATININE 0.94 0.99 0.88  CALCIUM 8.5* 9.0 8.7*  MG 1.8  --   --   PHOS 3.0  --   --    GFR: Estimated Creatinine Clearance: 75.3 mL/min (by C-G formula based on SCr of 0.88 mg/dL).   Scheduled Meds:  Chlorhexidine Gluconate Cloth  6 each Topical Daily   enoxaparin (LOVENOX) injection  40 mg Subcutaneous Q24H   Continuous Infusions:  dextrose 5 % and 0.9% NaCl 75 mL/hr at 05/14/22 0453     LOS: 1 day    T. , MD [facility] Office  [phone] Pager - Text Page per Amion  If 7PM-7AM, please contact night-coverage per Amion 05/14/2022, 9:37 AM     

## 2022-05-14 NOTE — Progress Notes (Signed)
PHARMACY - TOTAL PARENTERAL NUTRITION CONSULT NOTE   Indication: Crohn's disease and complex past surgical history including ex lap with small bowel resection, lysis of adhesion, mesh explantation, and diverting loop ileostomy in April 2023, on TPN prior to admission  Patient Measurements: Height: 5' 10"  Weight: 67.2 kg (148 lb 2.4 oz)   Body mass index is 21.26 kg/m.  Assessment: Patient with PMH of Crohn's disease and complex surgical history as above on chronic TPN who presented on 05/13/22 with altered mental status after spouse found patient unresponsive with multiple pill bottles open around him. Patient was transported from SNF to ED the day prior for generalized weakness and acute worsening of his chronic abdominal pain. Patient decided to return home instead of his SNF. Pharmacy consulted to resume TPN as inpatient.   Discharge from [place] 04/19/22:  [facility]: [name], PharmD, ph: [phone]  Per notes from TPN pharmacist at [place]:  Estimated Nutrient Requirements at [place]: 1.5 - 1.7 g/kg/day (98 - 104 g/day) Total kcal: 30 - 35 kcal/kg/day (1830 - 1983 kcal/day)   TPN Formulation at [place] per TPN pharmacist: [A 6.6%, D 20%, F 3.1%] @ goal rate, 65 mL/hr (TV: 1560 mL/day) provides:  Protein 1.7 g/kg/day 103 g/day  Total kcal 32 kcal/kg/day 1971 kcal/day  NPC: N ratio 94   Admitted at [place] 04/29/22-05/03/22: TPN formula provided 113 g of protein and 1943 kcal per day at goal rate of 80 mL/hr   Glucose / Insulin: No hx DM -Blood glucose 106 on CMET this AM Electrolytes: all, including Corrected Calcium, WNL Renal: SCr, BUN WNL Hepatic: LFTs, T.bili WNL. TG ordered for AM (WNL on 10/16). Albumin low at 3.2.  I/O: + 9m UOP: 400 mL Stool: 650 mL + 1 unmeasured occurrence - MIVF: D5NS at 75 mL/hr GI Imaging: - 10/27 CTa/p: no significant interval change since 10/14 CT. No evidence of bowel obstruction or other  complicating feature at the anastomotic site or ostomy. - 10/14 CTa/p: previous surgical changes noted (transverse-sigmoid anastomosis) with stable inflammation since 10/12 CT GI Surgeries / Procedures: n/a  Central access: had prior to admission - double lumen CVC TPN start date: started PTA  Nutritional Goals: See above  RD Assessment this admission: pending     Current Nutrition:  NPO  Plan:  After cutoff time to order TPN today. Have ordered TPN labs for tomorrow. TPN will resume tomorrow at 1800.  Will reach out to [facility] to obtain most current TPN formula.   Continue IVF per MD.    JLindell Spar PharmD, BCPS [phone]/29/2023, 1:03 PM

## 2022-05-15 ENCOUNTER — Inpatient Hospital Stay (HOSPITAL_COMMUNITY): Payer: Medicare Other

## 2022-05-15 ENCOUNTER — Inpatient Hospital Stay (HOSPITAL_COMMUNITY)
Admit: 2022-05-15 | Discharge: 2022-05-15 | Disposition: A | Discharge status: Home / Self Care | Payer: Medicare Other | Attending: Internal Medicine | Admitting: Internal Medicine

## 2022-05-15 DIAGNOSIS — R4182 Altered mental status, unspecified: Secondary | ICD-10-CM

## 2022-05-15 DIAGNOSIS — R401 Stupor: Secondary | ICD-10-CM | POA: Diagnosis not present

## 2022-05-15 DIAGNOSIS — G9341 Metabolic encephalopathy: Secondary | ICD-10-CM

## 2022-05-15 LAB — CBC
HCT: 33.6 % — ABNORMAL LOW (ref 39.0–52.0)
Hemoglobin: 10.2 g/dL — ABNORMAL LOW (ref 13.0–17.0)
MCH: 24.2 pg — ABNORMAL LOW (ref 26.0–34.0)
MCHC: 30.4 g/dL (ref 30.0–36.0)
MCV: 79.6 fL — ABNORMAL LOW (ref 80.0–100.0)
Platelets: 192 10*3/uL (ref 150–400)
RBC: 4.22 MIL/uL (ref 4.22–5.81)
RDW: 18.6 % — ABNORMAL HIGH (ref 11.5–15.5)
WBC: 17.7 10*3/uL — ABNORMAL HIGH (ref 4.0–10.5)
nRBC: 0 % (ref 0.0–0.2)

## 2022-05-15 LAB — MRSA NEXT GEN BY PCR, NASAL: MRSA by PCR Next Gen: DETECTED — AB

## 2022-05-15 LAB — COMPREHENSIVE METABOLIC PANEL
ALT: 16 U/L (ref 0–44)
AST: 18 U/L (ref 15–41)
Albumin: 3.2 g/dL — ABNORMAL LOW (ref 3.5–5.0)
Alkaline Phosphatase: 84 U/L (ref 38–126)
Anion gap: 7 (ref 5–15)
BUN: 12 mg/dL (ref 8–23)
CO2: 24 mmol/L (ref 22–32)
Calcium: 8.6 mg/dL — ABNORMAL LOW (ref 8.9–10.3)
Chloride: 108 mmol/L (ref 98–111)
Creatinine, Ser: 0.96 mg/dL (ref 0.61–1.24)
GFR, Estimated: >60 mL/min (ref >60)
Glucose, Bld: 132 mg/dL — ABNORMAL HIGH (ref 70–99)
Potassium: 3.4 mmol/L — ABNORMAL LOW (ref 3.5–5.1)
Sodium: 139 mmol/L (ref 135–145)
Total Bilirubin: 0.9 mg/dL (ref 0.3–1.2)
Total Protein: 7 g/dL (ref 6.5–8.1)

## 2022-05-15 LAB — BLOOD GAS, ARTERIAL
Acid-Base Excess: 1.3 mmol/L (ref 0.0–2.0)
Bicarbonate: 24.9 mmol/L (ref 20.0–28.0)
O2 Saturation: 99.2 %
Patient temperature: 39.1
pCO2 arterial: 38 mmHg (ref 32–48)
pH, Arterial: 7.43 (ref 7.35–7.45)
pO2, Arterial: 196 mmHg — ABNORMAL HIGH (ref 83–108)

## 2022-05-15 LAB — BASIC METABOLIC PANEL
Anion gap: 6 (ref 5–15)
BUN: 16 mg/dL (ref 8–23)
CO2: 23 mmol/L (ref 22–32)
Calcium: 8.1 mg/dL — ABNORMAL LOW (ref 8.9–10.3)
Chloride: 111 mmol/L (ref 98–111)
Creatinine, Ser: 1.01 mg/dL (ref 0.61–1.24)
GFR, Estimated: >60 mL/min (ref >60)
Glucose, Bld: 401 mg/dL — ABNORMAL HIGH (ref 70–99)
Potassium: 3 mmol/L — ABNORMAL LOW (ref 3.5–5.1)
Sodium: 140 mmol/L (ref 135–145)

## 2022-05-15 LAB — GLUCOSE, CAPILLARY
Glucose-Capillary: 116 mg/dL — ABNORMAL HIGH (ref 70–99)
Glucose-Capillary: 179 mg/dL — ABNORMAL HIGH (ref 70–99)
Glucose-Capillary: 99 mg/dL (ref 70–99)

## 2022-05-15 LAB — COOXEMETRY PANEL
Carboxyhemoglobin: 1.7 % — ABNORMAL HIGH (ref 0.5–1.5)
Methemoglobin: <0.7 % (ref 0.0–1.5)
O2 Saturation: 82.4 %
Total hemoglobin: 7.3 g/dL — ABNORMAL LOW (ref 12.0–16.0)

## 2022-05-15 LAB — TRIGLYCERIDES: Triglycerides: 61 mg/dL (ref <150)

## 2022-05-15 LAB — MAGNESIUM
Magnesium: 1.8 mg/dL (ref 1.7–2.4)
Magnesium: 1.8 mg/dL (ref 1.7–2.4)

## 2022-05-15 LAB — PHOSPHORUS: Phosphorus: 2.4 mg/dL — ABNORMAL LOW (ref 2.5–4.6)

## 2022-05-15 MED ORDER — MIDAZOLAM HCL 2 MG/2ML IJ SOLN: 1.0000 mg | INTRAMUSCULAR | Status: DC | PRN

## 2022-05-15 MED ORDER — POTASSIUM PHOSPHATES 15 MMOLE/5ML IV SOLN
20.0000 mmol | Freq: Once | INTRAVENOUS | Status: AC
  Administered 2022-05-15: 20 mmol via INTRAVENOUS
  Filled 2022-05-15: qty 6.67

## 2022-05-15 MED ORDER — PANTOPRAZOLE 2 MG/ML SUSPENSION
40.0000 mg | Freq: Every day | ORAL | Status: DC
  Administered 2022-05-16: 40 mg
  Filled 2022-05-15 (×2): qty 20

## 2022-05-15 MED ORDER — FENTANYL CITRATE (PF) 100 MCG/2ML IJ SOLN
100.0000 ug | Freq: Once | INTRAMUSCULAR | Status: AC
  Administered 2022-05-15: 100 ug via INTRAVENOUS

## 2022-05-15 MED ORDER — MIDAZOLAM HCL 2 MG/2ML IJ SOLN
2.0000 mg | Freq: Once | INTRAMUSCULAR | Status: AC
  Administered 2022-05-15: 2 mg via INTRAVENOUS

## 2022-05-15 MED ORDER — ORAL CARE MOUTH RINSE: 15.0000 mL | OROMUCOSAL | Status: DC | PRN

## 2022-05-15 MED ORDER — MUPIROCIN 2 % EX OINT
1.0000 | TOPICAL_OINTMENT | Freq: Two times a day (BID) | CUTANEOUS | Status: AC
  Administered 2022-05-15 – 2022-05-20 (×10): 1 via NASAL
  Filled 2022-05-15 (×2): qty 22

## 2022-05-15 MED ORDER — FENTANYL CITRATE PF 50 MCG/ML IJ SOSY
25.0000 ug | PREFILLED_SYRINGE | INTRAMUSCULAR | Status: DC | PRN
  Administered 2022-05-15: 25 ug via INTRAVENOUS
  Filled 2022-05-15: qty 1

## 2022-05-15 MED ORDER — FENTANYL CITRATE PF 50 MCG/ML IJ SOSY
25.0000 ug | PREFILLED_SYRINGE | INTRAMUSCULAR | Status: DC | PRN
  Administered 2022-05-16: 75 ug via INTRAVENOUS
  Administered 2022-05-16: 50 ug via INTRAVENOUS
  Administered 2022-05-16: 100 ug via INTRAVENOUS
  Filled 2022-05-15: qty 1
  Filled 2022-05-15 (×2): qty 2

## 2022-05-15 MED ORDER — ORAL CARE MOUTH RINSE
15.0000 mL | OROMUCOSAL | Status: DC
  Administered 2022-05-15 – 2022-05-17 (×18): 15 mL via OROMUCOSAL

## 2022-05-15 MED ORDER — ROCURONIUM BROMIDE 50 MG/5ML IV SOLN
70.0000 mg | Freq: Once | INTRAVENOUS | Status: AC
  Administered 2022-05-15: 70 mg via INTRAVENOUS
  Filled 2022-05-15: qty 7

## 2022-05-15 MED ORDER — ETOMIDATE 2 MG/ML IV SOLN
20.0000 mg | Freq: Once | INTRAVENOUS | Status: AC
  Administered 2022-05-15: 20 mg via INTRAVENOUS

## 2022-05-15 MED ORDER — PANTOPRAZOLE 2 MG/ML SUSPENSION: 40.0000 mg | Freq: Every day | ORAL | Status: DC

## 2022-05-15 MED ORDER — TRAVASOL 10 % IV SOLN
INTRAVENOUS | Status: AC
  Filled 2022-05-15: qty 1152

## 2022-05-15 MED ORDER — SODIUM CHLORIDE 0.9 % IV SOLN
3.0000 g | Freq: Four times a day (QID) | INTRAVENOUS | Status: AC
  Administered 2022-05-15 – 2022-05-20 (×19): 3 g via INTRAVENOUS
  Filled 2022-05-15 (×19): qty 8

## 2022-05-15 NOTE — Progress Notes (Signed)
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 Information was given to MRI staff regarding the patient's pacemaker (lt axillary vein, [employer] model IsoFlex Optimum 8828/00 (serial number  [id number]), right atrial lead and an [employer] model Tendril MRI LPA1200M (serial number  [id number]; both of which were connected to the [employer] MRI  model [id number] (serial number [id number]) pacemaker) .  Per Bailey Mech with MRI this will have to be scheduled with staff on 05/16/22 as the current staff must call/schedule with the manufacturer(s) to have the device evaluated/disconnected for the procedure.

## 2022-05-15 NOTE — Procedures (Signed)
Intubation Procedure Note  KOLTON KIENLE  [id number]  10-04-1950  Date:05/15/22  Time:5:46 PM   Provider Performing:Brent Jaqwan Wieber    Procedure: Intubation (31500)  Indication(s) Respiratory Failure  Consent Risks of the procedure as well as the alternatives and risks of each were explained to the patient and/or caregiver.  Consent for the procedure was obtained and is signed in the bedside chart   Anesthesia Etomidate, Versed, Fentanyl, and Rocuronium   Time Out Verified patient identification, verified procedure, site/side was marked, verified correct patient position, special equipment/implants available, medications/allergies/relevant history reviewed, required imaging and test results available.   Sterile Technique Usual hand hygeine, masks, and gloves were used   Procedure Description Patient positioned in bed supine.  Sedation given as noted above.  Patient was intubated with endotracheal tube using Glidescope.  View was Grade 1 full glottis .  Number of attempts was 1.  Colorimetric CO2 detector was consistent with tracheal placement.   Complications/Tolerance None; patient tolerated the procedure well. Chest X-ray is ordered to verify placement.   EBL Minimal   Specimen(s) None  Roselie Awkward, MD [facility] Pager: [phone] Cell: [phone] After 7:00 pm call Elink  [phone]

## 2022-05-15 NOTE — Progress Notes (Addendum)
PHARMACY - TOTAL PARENTERAL NUTRITION CONSULT NOTE   Indication: Crohn's disease and complex past surgical history including ex lap with small bowel resection, lysis of adhesion, mesh explantation, and diverting loop ileostomy in April 2023, on TPN prior to admission  Patient Measurements: Height: 5' 10"  Height: 5' 10"  (177.8 cm) Weight: 67.2 kg (148 lb 2.4 oz) IBW/kg (Calculated) : 73   Body mass index is 21.26 kg/m.  Assessment: Patient with PMH of Crohn's disease and complex surgical history as above on chronic TPN who presented on 05/13/22 with altered mental status after spouse found patient unresponsive with multiple pill bottles open around him. Patient was transported from SNF to ED the day prior for generalized weakness and acute worsening of his chronic abdominal pain. Patient decided to return home instead of his SNF. Pharmacy consulted to resume TPN as inpatient.   Discharge from [place] 04/19/22:  TPN pharmacist at [place]:  Estimated Nutrient Requirements: Protein: 1.5 - 1.7 g/kg/day (98 - 104 g/day), Total kcal: 30 - 35 kcal/kg/day (1830 - 1983 kcal/day)  TPN Formulation at [place] per TPN pharmacist: [A 6.6%, D 20%, F 3.1%] @ goal rate, 65 mL/hr (TV: 1560 mL/day) provides: Protein 1.7 g/kg/day 103 g/day, Total kcal 32 kcal/kg/day 1971 kcal/day  NPC: N ratio 94   Admitted at [place] 04/29/22-05/03/22: Nutritional Goals: TPN 80 mL/hr (provides 113 g of protein and 1943 kcals per day) RD Assessment: (10/16): 2100-2450kcal (30-35kcal/kg), Protein 105-130g (1.5-1.8g/kg) Utilize intralipid rather than SMOF as 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  [facility]: [name], PharmD, ph: [phone] 10/28 Most recent formula: Total volume 1560 mL:  Dextrose 220 g/day, Plenamine 120 g/day, SMOFlipid 50 g/day, NaCl 75 mEq, CaGluc 6 mEq, Potassium Ac 20 mEq, Potassium Phos 13.5 nM, Mag Sulfate 8 mEq, Vitamins/trace  elements  Glucose / Insulin: No hx DM -Blood glucose 132 on CMET this AM Electrolytes: K and Phos low, others WNL Renal: SCr, BUN WNL Hepatic: LFTs, T.bili WNL. TG WNL. Albumin low at 3.2.  I/O: net -260m, adequate UOP, ileostomy output.  - MIVF: D5NS at 75 mL/hr GI Imaging: - 10/27 CTa/p: no significant interval change since 10/14 CT. No evidence of bowel obstruction or other complicating feature at the anastomotic site or ostomy. - 10/14 CTa/p: previous surgical changes noted (transverse-sigmoid anastomosis) with stable inflammation since 10/12 CT GI Surgeries / Procedures: n/a  Central access: prior to admission - double lumen CVC TPN start date: started PTA  Nutritional Goals: TPN goal rate is 80 mL/hr and provides 115 g of protein and 1950 kcals per day.  RD Assessment this admission: pending     Current Nutrition:  NPO  Plan:  Now, KPhos 20 mmol IV At 1800, Start TPN at 80 mL/hr  Using standard SMOF lipid formulation despite shellfish allergy.  He previously received intra-lipid in TPN due to allergy (10/15-10/18), but outpatient [facility] confirms that TPN formula has been using SMOF lipids since 04/18/22 without reaction.   Electrolytes in TPN: Na 51m/L, K 3040mL, Ca 5mE62m, Mg 8mEq32m and Phos 15mmo65m Cl:Ac 1:2 Add standard MVI and trace elements to TPN Initiate Sensitive q8h SSI and adjust as needed  Reduce MIVF to KVO at 1800 Monitor TPN labs on Mon/Thurs, and daily x3 days.   ChristGretta ArabD, BCPS [facility] main pharmacy [phone]/2023 7:38 AM

## 2022-05-15 NOTE — Progress Notes (Signed)
Informed Dr. Heber [place] of pink-tinged urine post foley insertion. Inquired as to whether the provider would like a u/a collected. Per provider to continue to assess the patient's urine output with PM shift and inform provider if there is a decrease in urine output or if the color of the urine persists with the pink-tinged color.

## 2022-05-15 NOTE — Progress Notes (Signed)
Pt intubated by CCM with no complications

## 2022-05-15 NOTE — Progress Notes (Signed)
Initial Nutrition Assessment  DOCUMENTATION CODES:   Severe malnutrition in context of chronic illness  INTERVENTION:  -Increase nutrition provided in TPN, recommend: 300g dextrose (GIR 3.1), 115g AA (1.7g/kg), and 60g lipids to provide 2080kcal (31kcal/kg). -Advance diet as clinically appropriate at mental status improves  NUTRITION DIAGNOSIS:  Severe Malnutrition related to chronic illness as evidenced by percent weight loss, severe fat depletion, severe muscle depletion.  GOAL:  Patient will meet greater than or equal to 90% of their needs  MONITOR:  Diet advancement, Labs, Other (Comment) (TPN)  REASON FOR ASSESSMENT:  Consult New TPN/TNA  ASSESSMENT:  Pt is a 71yo M with PMH of Crohn's disease resulting in a complicated surgical history followed at [facility] to include left hemicolectomy and ileostomy with multiple other complications to include TPN dependence, chronic prostatitis, SVT, and asthma who presents with generalized weakness, acute worsening of his chronic abdominal pain and AMS.  Pt known from previous admission. RD previously increased calories in TPN by 6% due to continued weight loss. Per chart review, pt continues to lose weight. Weight history in EMR shows a significant 8% weight loss in the last 3 months. Pt is NPO due to AMS. Recommend increasing calories by 11%.   TPN recommendations: 300g dextrose (GIR 3.1), 115g AA (1.7g/kg), and 60g lipids to provide 2080kcal (31kcal/kg).  Visited pt at bedside with wife and sister present. Wife reports pt has not been eating well recently. He is offered Ensure supplements, but only drinks them while admitted to the hospital. He continues to follow an "ostomy diet" and avoid raw vegetables and tough meats. She reports continued weight loss over the last month. She says he'll gain weight and be doing well but then something happens and he ends up back in the hospital and loses weight. Chart review shows a significant 8%  unintended weight loss in the last 3 months. NFPE reveals severe muscle wasting and severe fat loss. Pt meets ASPEN criteria for severe protein calorie malnutrition.  Spoke with pt's wife about increasing the nutrition provided by the TPN while NPO status persists, she is agreeable. Recommend >30kcal/kg/day.  Medications reviewed and include: D5NS @ 20m/hr, KPhos  Labs reviewed: Na:139, K:3.4, BG:132, Phos:2.4, triglycerides:61, iron:18, ferritin:22, B12:301  Weight history: 05/13/22 67.2 kg  05/02/22 72 kg  04/27/22 65.8 kg  04/10/22 65.8 kg  03/28/22 65 kg  02/14/22 72.8 kg  02/13/22 73.1 kg  Significant 8% unintended weight loss in the last 3 months   NUTRITION - FOCUSED PHYSICAL EXAM:  Flowsheet Row Most Recent Value  Orbital Region Severe depletion  Upper Arm Region Severe depletion  Thoracic and Lumbar Region Severe depletion  Buccal Region Moderate depletion  Temple Region Severe depletion  Clavicle Bone Region Moderate depletion  Clavicle and Acromion Bone Region Severe depletion  Scapular Bone Region Unable to assess  Dorsal Hand Unable to assess  [in mitts]  Patellar Region Severe depletion  Anterior Thigh Region Severe depletion  Posterior Calf Region Severe depletion  Hair Reviewed  Eyes Unable to assess  Mouth Reviewed  Skin Reviewed  Nails Unable to assess      Diet Order:   Diet Order             Diet NPO time specified  Diet effective now                   EDUCATION NEEDS:  Education needs have been addressed  Skin:  Skin Assessment: Reviewed RN Assessment  Last BM:  10/30-ostomy  Height:  Ht Readings from Last 1 Encounters:  05/14/22 5' 10"  (1.778 m)   Weight:  Wt Readings from Last 1 Encounters:  05/15/22 66.3 kg   Ideal Body Weight:   75.5kg  BMI:  Body mass index is 20.97 kg/m.  Estimated Nutritional Needs:  Kcal:  2015-2350kcal (30-35kcal/kg) Protein:  100-135 (1.5-2.0g/kg) Fluid:  1700-2184m  KCandise Bowens MS,  RD, LDN, CNSC See AMiON for contact information

## 2022-05-15 NOTE — Procedures (Signed)
Patient Name: [name]  MRN: [id number]  Epilepsy Attending: Lora Havens  Referring Physician/Provider: Cherene Altes, MD  Date: 05/15/2022  Duration: 23.35 mins  Patient history: 71yo M with ams. EEG to evaluate for seizure  Level of alertness: Lethargic, asleep  AEDs during EEG study: None   Technical aspects: This EEG study was done with scalp electrodes positioned according to the 10-20 International system of electrode placement. Electrical activity was reviewed with band pass filter of 1-70Hz , sensitivity of 7 uV/mm, display speed of 69m/sec with a 60Hz  notched filter applied as appropriate. EEG data were recorded continuously and digitally stored.  Video monitoring was available and reviewed as appropriate.  Description: During awake state, EEG showed continuous generalized 2-3Hz  delta slowing, at times with triphasic morphology. Sleep was characterized by sleep spindles (12 to 14 Hz), maximal frontocentral region. Hyperventilation and photic stimulation were not performed.     ABNORMALITY - Continuous slow, generalized  IMPRESSION: This study is suggestive of severe diffuse encephalopathy, nonspecific etiology. No seizures or epileptiform discharges were seen throughout the recording.  [place] OBarbra Sarks

## 2022-05-15 NOTE — Progress Notes (Signed)
EEG complete - results pending 

## 2022-05-15 NOTE — Progress Notes (Signed)
Pharmacy Antibiotic Note  [name] is a 71 y.o. male admitted on 05/13/2022 with aspiration pneumonia.  Pharmacy has been consulted for unasyn dosing.  Plan: Unasyn 3g IV q6h No dose adjustments anticipated.  Pharmacy will sign off and monitor peripherally via electronic surveillance software for any changes in renal function or micro data.   Height: 5' 10"  (177.8 cm) Weight: 64.7 kg (142 lb 10.2 oz) IBW/kg (Calculated) : 73  Temp (24hrs), Avg:98.9 F (37.2 C), Min:97.4 F (36.3 C), Max:102.3 F (39.1 C)  Recent Labs  Lab 05/12/22 0943 05/13/22 1212 05/14/22 0427 05/15/22 0307  WBC 9.2 5.5 8.0 17.7*  CREATININE 0.94 0.99 0.88 0.96    Estimated Creatinine Clearance: 66.5 mL/min (by C-G formula based on SCr of 0.96 mg/dL).    Allergies  Allergen Reactions   Purinethol [Mercaptopurine] Other (See Comments)    Pancreatitis    Shellfish Allergy Anaphylaxis and Swelling   Humira [Adalimumab] Other (See Comments)    Developed antibodies   Tape Rash   Wound Dressing Adhesive Rash    Antimicrobials this admission: 10/30 Unasyn >>  Dose adjustments this admission:  Microbiology results: MRSA PCR:  Thank you for allowing pharmacy to be a part of this patient's care.  Peggyann Juba, PharmD, BCPS Pharmacy: [phone] 05/15/2022 6:22 PM

## 2022-05-15 NOTE — TOC Initial Note (Signed)
Transition of Care [facility]) - Initial/Assessment Note    Patient Details  Name: [name] MRN: [id number] Date of Birth: [birth year]  [facility] CM/SW Contact:    Dessa Phi, RN Phone Number: 05/15/2022, 11:15 AM  Clinical Narrative: Noted from home. Recent stay at [facility]. Will await MD recc & cons to asst with plans.                  Expected Discharge Plan:  (TBD) Barriers to Discharge: Continued Medical Work up   Patient Goals and CMS Choice Patient states their goals for this hospitalization and ongoing recovery are::  (Per [name](son) prefer rehab.) CMS Medicare.gov Compare Post Acute Care list provided to:: Patient Represenative (must comment) ([name](Son)) Choice offered to / list presented to : Adult Children  Expected Discharge Plan and Services Expected Discharge Plan:  (TBD)   Discharge Planning Services: CM Consult Post Acute Care Choice: [place] Living arrangements for the past 2 months: Single Family Home                                      Prior Living Arrangements/Services Living arrangements for the past 2 months: Single Family Home Lives with:: Spouse Patient language and need for interpreter reviewed:: Yes Do you feel safe going back to the place where you live?: Yes      Need for Family Participation in Patient Care: Yes (Comment) Care giver support system in place?: Yes (comment)   Criminal Activity/Legal Involvement Pertinent to Current Situation/Hospitalization: No - Comment as needed  Activities of Daily Living Home Assistive Devices/Equipment: Eyeglasses ADL Screening (condition at time of admission) Patient's cognitive ability adequate to safely complete daily activities?: No Is the patient deaf or have difficulty hearing?: No Does the patient have difficulty seeing, even when wearing glasses/contacts?: No Does the patient have difficulty concentrating, remembering, or making decisions?:  Yes Patient able to express need for assistance with ADLs?: No Does the patient have difficulty dressing or bathing?: Yes Independently performs ADLs?: No Walks in Home: Independent Does the patient have difficulty walking or climbing stairs?: No Weakness of Legs: Both Weakness of Arms/Hands: Both  Permission Sought/Granted Permission sought to share information with : Case Manager Permission granted to share information with : Yes, Verbal Permission Granted  Share Information with NAME:  (Case manager)           Emotional Assessment Appearance:: Appears stated age Attitude/Demeanor/Rapport: Unable to Assess Affect (typically observed): Unable to Assess Orientation: :  (unable to assess) Alcohol / Substance Use: Not Applicable Psych Involvement: No (comment)  Admission diagnosis:  Metabolic encephalopathy [J85.63] Obtundation [R40.1] Chronic anemia [D64.9] Generalized weakness [R53.1] Crohn's disease with other complication, unspecified gastrointestinal tract location [facility]) [K50.918] Patient Active Problem List   Diagnosis Date Noted   Obtundation 05/13/2022   Chronic prostatitis 05/01/2022   Bladder calculi 05/01/2022   Nausea and vomiting    Dysuria    Hypoglycemia 04/29/2022   Hypotension 04/29/2022   Pelvic floor dysfunction 04/27/2022   Crohn's disease of small intestine with other complication ([place]) 14/97/0263   Protein-calorie malnutrition, severe 01/05/2022   Nausea 01/05/2022   AKI (acute kidney injury) ([place]) 01/03/2022   Leukocytosis 01/03/2022   Transaminitis 01/03/2022   Crohn's disease ([place]) 01/03/2022   Ileostomy in place [facility]) 01/03/2022   Pacemaker 01/03/2022   Sepsis ([place]) 09/21/2021   Thrombocytopenia ([place]) 09/21/2021   Hypocalcemia  09/21/2021   Hypophosphatemia 09/21/2021   Hypokalemia 09/21/2021   External hemorrhoid, thrombosed 05/27/2019   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   Drug-induced acute pancreatitis without infection or  necrosis - from 6 MP 09/24/2017   Long-term use of immunosuppressant medication  - Entyvio 06/28/2017   Crohn's ileitis ([place])  05/03/2017   Psoriasis 08/23/2016   Insomnia 04/22/2014   Enlarged thoracic aorta ([place]) 09/06/2012   SVT (supraventricular tachycardia) 08/04/2012   Vitamin D deficiency 06/18/2012   Attention deficit hyperactivity disorder (ADHD) 05/28/2009   BPH (benign prostatic hyperplasia) 07/24/2007   Asthma 03/06/2007   GERD 03/06/2007   DIVERTICULITIS, HX OF 03/06/2007   PCP:  [name], MD Pharmacy:  No Pharmacies Listed    Social Determinants of Health (SDOH) Interventions    Readmission Risk Interventions    05/15/2022   10:53 AM  Readmission Risk Prevention Plan  Transportation Screening Complete  Medication Review (RN Care Manager) Complete  PCP or Specialist appointment within 3-5 days of discharge Complete  HRI or Home Care Consult Complete  SW Recovery Care/Counseling Consult Complete  Palliative Care Screening Not [place] Complete

## 2022-05-15 NOTE — Consult Note (Addendum)
Neurology Consultation    Reason for Consult:Obtunded  CC: Altered Mental Status  HISTORY OF PRESENT ILLNESS   HPI  History is obtained from: Chart Review 71yo w/ a hx of Crohn's disease resulting in a complicated surgical history followed at [facility] to include left hemicolectomy and ileostomy with multiple other complications to include TPN dependence, chronic prostatitis, SVT with pacemaker, and asthma who was transported to the ER from his SNF ([facility]) the day prior to this admit w/ generalized weakness and acute worsening of his chronic abdominal pain. He decided to return home instead of his SNF, and then the morning of this admit was found by his wife at home in an unresponsive state with multiple pill bottles open around him (name of pills and how many missing is unknown, suspect trazodone). Narcan given in the ED had no appreciable effect.  CT head was without acute findings.  CXR unrevealing.  He had a CT abdomen/pelvis 10/27 which revealed no acute findings.  UA unrevealing.  Ethanol level undetectable.  Acetaminophen and aspirin levels undetectable.  ABG without significant derangement.  UDS positive for opiates and benzos, both of which he is prescribed.     Of note the patient was admitted to the hospital 10/14 >05/03/2022 after treatment for hypotension abdominal pain and complications related to accidentally receiving a TPN bolus at his SNF.  He was also seen in the ER on 10/27 and then discharged home.  On exam patient appears to be in respiratory distress, hospitalist is at the bedside. Plan to transfer patient to ICU for emergent intubation.   ROS: Unable to obtain due to altered mental status.   PAST MEDICAL HISTORY    Past Medical History:  Past Medical History:  Diagnosis Date   Acute prostatitis 07/24/2007   Qualifier: Diagnosis of  By: [name] MD, [name]    Allergy    mild   Arthritis    osteoarthritis   Asthma    Blood transfusion without reported diagnosis     BPH (benign prostatic hypertrophy) with urinary obstruction    Crohn's ileitis ([place]) suspected 05/03/2017   Dilated aortic root ([place])    noted on echo 08/2012   Diverticulitis of colon    EPIDIDYMITIS 02/15/2010   Qualifier: Diagnosis of  By: [name] MD, [name]    GERD (gastroesophageal reflux disease)    H/O: GI bleed    Hemorrhoids    Hepatitis 1975   unknown type    HERPES SIMPLEX INFECTION 10/14/2007   Qualifier: Diagnosis of  By: [name] MD, [name]    Hiatal hernia    Ileus following gastrointestinal surgery ([place]) 12/26/2011   Long term (current) use of systemic steroids 06/18/2018   Psoriasis    se[name].   Recurrent ventral incisional hernia 05/10/2012   SVT (supraventricular tachycardia)    Ulcer 08/21/2013   ileal    No family history on file. Family History  Problem Relation Age of Onset   Lung cancer Mother    Leukemia Father    Hypertension Father    Heart disease Father    Heart attack Father    Prostate cancer Father    Prostate cancer Paternal Uncle    Colon cancer Neg Hx    Stomach cancer Neg Hx    Colon polyps Neg Hx    Esophageal cancer Neg Hx    Rectal cancer Neg Hx     Allergies:  Allergies  Allergen Reactions   Purinethol [Mercaptopurine]  Other (See Comments)    Pancreatitis    Shellfish Allergy Anaphylaxis and Swelling   Humira [Adalimumab] Other (See Comments)    Developed antibodies   Tape Rash   Wound Dressing Adhesive Rash    Social History:   reports that he has never smoked. He has never used smokeless tobacco. He reports that he does not currently use alcohol. He reports current drug use. Drug: Oxycodone.    Medications Medications Prior to Admission  Medication Sig Dispense Refill   acetaminophen (TYLENOL) 325 MG tablet Take 650 mg by mouth every 6 (six) hours as needed for moderate pain.     albuterol (PROVENTIL HFA;VENTOLIN HFA) 108 (90 Base) MCG/ACT inhaler INHALE 2 PUFFS INTO THE LUNGS EVERY 4 (FOUR)  HOURS AS NEEDED FOR WHEEZING OR SHORTNESS OF BREATH. 8.5 Inhaler 2   Amino Ac Elect-Calc in D15W (CLINIMIX E/DEXTROSE, 5/15, IV) Inject 65 mL/hr into the vein continuous. Via powerline TPN IV (104gm aa/183 GM DEC/50GM)     busPIRone (BUSPAR) 5 MG tablet Take 5 mg by mouth 3 (three) times daily.     calcium carbonate (TUMS - DOSED IN MG ELEMENTAL CALCIUM) 500 MG chewable tablet Chew 500 mg by mouth 3 (three) times daily as needed for indigestion or heartburn.     melatonin 3 MG TABS tablet Take 3 mg by mouth at bedtime.     METAMUCIL FIBER PO Take 1 packet by mouth in the morning.     morphine (MSIR) 15 MG tablet Take 15 mg by mouth every 4 (four) hours as needed for severe pain.     Multiple Vitamin (MULTIVITAMIN WITH MINERALS) TABS tablet Take 1 tablet by mouth daily.     omeprazole (PRILOSEC) 20 MG capsule Take 20 mg by mouth in the morning.     ondansetron (ZOFRAN-ODT) 4 MG disintegrating tablet Take 4 mg by mouth every 6 (six) hours as needed for nausea or vomiting.     OVER THE COUNTER MEDICATION Take 120 mLs by mouth See admin instructions. Medpass 126m twice daily     oxybutynin (DITROPAN) 5 MG tablet Take 1 tablet (5 mg total) by mouth 3 (three) times daily. 90 tablet 2   simethicone (MYLICON) 80 MG chewable tablet Chew 80 mg by mouth 4 (four) times daily -  with meals and at bedtime.     sulfamethoxazole-trimethoprim (BACTRIM DS) 800-160 MG tablet Take 1 tablet by mouth every 12 (twelve) hours for 25 days. (Patient taking differently: Take 1 tablet by mouth every 12 (twelve) hours. 25 day course. From 10/18-11/12) 50 tablet 0   tamsulosin (FLOMAX) 0.4 MG CAPS capsule Take 1 capsule (0.4 mg total) by mouth daily after supper. 30 capsule 2   traZODone (DESYREL) 50 MG tablet Take 100 mg by mouth at bedtime.      EXAMINATION    Current vital signs:    05/15/2022    2:00 PM 05/15/2022    1:27 PM 05/15/2022    5:00 AM  Vitals with BMI  Weight 146 lbs 3 oz    BMI 261.60   Systolic   [id number] Diastolic  61 64  Pulse  96 103    Examination:  GENERAL: Eyes slightly open, inconsistently tracking examiner HEENT: - Normocephalic and atraumatic, dry mm, no lymphadenopathy, no Thyromegally LUNGS - bilateral breath sounds coarse, tachypnea CV - S1S2 RRR, equal pulses bilaterally. ABDOMEN - Soft, nontender, nondistended with normoactive BS Ext: warm, well perfused, intact peripheral pulses, no pedal edema  NEURO:  Mental Status: eyes slightly open, intermittently tracking examiner laterally   Does not follow commands, no speech appreciated  Cranial Nerves:  II: Hippus is noted; pupils are continuously constricting and dilating 2 mm -> 4 mm.  III, IV, VI: Corneal reflex intact, inconsistent blink to threat VII: right facial droop VIII: hearing intact to voice IX, X: Palate elevates symmetrically. Phonation is normal.  KX:FGHWEXHB shrug 5/5 and symmetrical  XII: tongue is midline without fasciculations. Motor:  RUE: withdraws to pain   LUE: withdraws to pain RLE: withdraws to pain   LLE: moves antigravity  Tone: diminished DTRs: 2+ and symmetrical throughout   Sensation- withdraws to pain in all extremities, more on the left than the right  Follow up exam by attending at 8 PM: Follows some simple commands but with increased latencies of responses   LABS   I have reviewed labs in epic and the results pertinent to this consultation are:  Lab Results  Component Value Date   [facility]  09/05/2008    53        Total Cholesterol/HDL:CHD Risk Coronary Heart Disease Risk Table                     Men   Women  1/2 Average Risk   3.4   3.3  Average Risk       5.0   4.4  2 X Average Risk   9.6   7.1  3 X Average Risk  23.4   11.0        Use the calculated Patient Ratio above and the CHD Risk Table to determine the patient's CHD Risk.        ATP III CLASSIFICATION (LDL):  <100     mg/dL   Optimal  100-129  mg/dL   Near or Above                    Optimal  130-159   mg/dL   Borderline  160-189  mg/dL   High  >190     mg/dL   Very High   Lab Results  Component Value Date   ALT 16 05/15/2022   AST 18 05/15/2022   ALKPHOS 84 05/15/2022   BILITOT 0.9 05/15/2022   No results found for: "HGBA1C" Lab Results  Component Value Date   WBC 17.7 (H) 05/15/2022   HGB 10.2 (L) 05/15/2022   HCT 33.6 (L) 05/15/2022   MCV 79.6 (L) 05/15/2022   PLT 192 05/15/2022   Lab Results  Component Value Date   VITAMINB12 301 05/14/2022   Lab Results  Component Value Date   FOLATE 16.8 05/14/2022   Lab Results  Component Value Date   NA 139 05/15/2022   K 3.4 (L) 05/15/2022   CL 108 05/15/2022   CO2 24 05/15/2022     DIAGNOSTIC IMAGING/PROCEDURES   I have reviewed the images obtained:, as below    Initial CT-head 1. No acute intracranial findings. 2. Partially empty sella. 3. Atherosclerosis. 4. Chronically stable small likely dystrophic calcifications along the right parietal periventricular white matter.  Follow-up CT on 10/30-stable with no acute intracranial pathology  EEG  This study is suggestive of severe diffuse encephalopathy, nonspecific etiology. No seizures or epileptiform discharges were seen throughout the recording.  ASSESSMENT/PLAN    Assessment: 71 yo male w/ a hx of Crohn's disease resulting in a complicated surgical history followed at [facility] to include left hemicolectomy and ileostomy with multiple other complications to include  TPN dependence, chronic prostatitis, SVT, and asthma presenting after being found at home unresponsive.  There was concern for an overdose and he has been obtunded since his admission.  Neurology was consulted to further assist in his work-up given the lack of change in his neurological status.  Upon our arrival hospitalist was at the bedside and the patient was in respiratory distress he was subsequently transferred to the ICU for intubation.  Concern for stroke given right facial droop and decreased  movement on the right side.  - Consider MRI imaging of the brain when respiratory status is stabilized (now ordered by CCM). - EEG: Continuous slow, generalized. This study is suggestive of severe diffuse encephalopathy, nonspecific etiology. No seizures or epileptiform discharges were seen throughout the recording. - AST, ALT, ammonia, BUN and Cr are normal. Mg and Na are normal. Ca mildly low in the context of low albumin level - UDS positive for benzodiazepines and opiates - WBC has increased this over 24 hours from 8 to 17.7  - Clinical picture is most consistent with severe sedation versus obtundation due to a primary neurological or metabolic cause. There is no fever and no agitation noted on exam, therefore serotonin syndrome is unlikely. No fever or neck stiffness to suggest a meningitis.  - DDx: Left hemisphere infarct vs anoxic brain injury vs trazodone or other medication overdose.    Recommendations: - MRI brain w/o if pacemaker is compatible - Consider vessel imaging - 2D Echo - Agree with intubation for respiratory distress and transfer to the ICU - Has improved in the 4 hour interval since he was intubated, now following commands. Continue to monitor for possible further improvement overnight, which would be expected in the setting of medication overdose, while also managing his other comorbidities.    -- Patient seen and examined by NP/APP with MD. MD to update note as needed.  Janine Ores, DNP, FNP-BC [facility] Pager: [phone]  I have seen and examined the patient. I have formulated the assessment and recommendations. 71 year old male with obtundation suspected to be secondary to medication overdose. Exam improving since he was intubated. Currently off all sedation. Recommendations as above.  Electronically signed: Dr. Kerney Elbe

## 2022-05-15 NOTE — Progress Notes (Signed)
Attending:    Subjective: 71 y/o male with Crohn's disease and a complicated abdominal surgical history with prior hemocolectomy and ileostomy who now resides in a SNF and uses TPN chronically presented to [facility] with confusion at home.  He had previously been sent ot the ER for evaluation by the SNF because he was experiencing genralized weakness.  He decided to take an [place] home and left AMA.  At home his wife says that he was noted to be unresponsive the following morning when she was making him breakfast so she called 911 and had him brought back to the ER.  He was given narcan with no effect and had a CT head performed which was normal.  An EEG today was normal.  UDS showed opiates and benzos.  He had a bottle of trazodone nearby when she found him prior to his second ER visit.  He was admitted yesterday and has remained encephalopathic since admission.  Today his respiratory status was worsening so Dr. Thereasa Solo discussed goals of care with the patient's family.  Given then relatively abrupt onset of this symptom they felt appropriate for him to be intubated for airway protection while investigating further for a diagnosis.  Past Medical History:  Diagnosis Date   Acute prostatitis 07/24/2007   Qualifier: Diagnosis of  By: [name] MD, [name]    Allergy    mild   Arthritis    osteoarthritis   Asthma    Blood transfusion without reported diagnosis    BPH (benign prostatic hypertrophy) with urinary obstruction    Crohn's ileitis ([place]) suspected 05/03/2017   Dilated aortic root ([place])    noted on echo 08/2012   Diverticulitis of colon    EPIDIDYMITIS 02/15/2010   Qualifier: Diagnosis of  By: [name] MD, [name]    GERD (gastroesophageal reflux disease)    H/O: GI bleed    Hemorrhoids    Hepatitis 1975   unknown type    HERPES SIMPLEX INFECTION 10/14/2007   Qualifier: Diagnosis of  By: [name] MD, [name]    Hiatal hernia    Ileus following gastrointestinal surgery ([place]) 12/26/2011   Long term  (current) use of systemic steroids 06/18/2018   Psoriasis    sees [name] at [facility]   Recurrent ventral incisional hernia 05/10/2012   SVT (supraventricular tachycardia)    Ulcer 08/21/2013   ileal     Objective: Vitals:   05/15/22 1656 05/15/22 1658 05/15/22 1700 05/15/22 1727  BP: 139/69 (!) 148/67 130/68 117/62  Pulse: 99 98 96 100  Resp: 16   16  Temp:    (!) 102.3 F (39.1 C)  TempSrc:    Axillary  SpO2: 100% 100% 100% 100%  Weight:    64.7 kg  Height:       Vent Mode: PRVC FiO2 (%):  [21 %-100 %] 100 % Set Rate:  [16 bmp] 16 bmp Vt Set:  [580 mL] 580 mL PEEP:  [5 cmH20] 5 cmH20 Plateau Pressure:  [14 cmH20] 14 cmH20  Intake/Output Summary (Last 24 hours) at 05/15/2022 1811 Last data filed at 05/15/2022 1600 Gross per 24 hour  Intake 2295.13 ml  Output 2510 ml  Net -214.87 ml    General:  obtunded in bed HENT: NCAT mucus membranes very dry PULM: Rhonchi bilaterally, normal effort CV: Tachy, regular, no mgr GI: BS+, soft, nontender MSK: normal bulk and tone Neuro: eyes open, glances leftward at me when I say his name but is otherwise unresponsive, doesn't  move to command or tactile stimulus   CBC    Component Value Date/Time   WBC 17.7 (H) 05/15/2022 0307   RBC 4.22 05/15/2022 0307   HGB 10.2 (L) 05/15/2022 0307   HGB 14.9 11/29/2020 1001   HCT 33.6 (L) 05/15/2022 0307   HCT 44.3 11/29/2020 1001   PLT 192 05/15/2022 0307   PLT 184 11/29/2020 1001   MCV 79.6 (L) 05/15/2022 0307   MCV 88 11/29/2020 1001   MCH 24.2 (L) 05/15/2022 0307   MCHC 30.4 05/15/2022 0307   RDW 18.6 (H) 05/15/2022 0307   RDW 14.3 11/29/2020 1001   LYMPHSABS 1.1 05/12/2022 0943   MONOABS 0.7 05/12/2022 0943   EOSABS 0.3 05/12/2022 0943   BASOSABS 0.0 05/12/2022 0943    BMET    Component Value Date/Time   NA 139 05/15/2022 0307   NA 141 11/29/2020 1001   K 3.4 (L) 05/15/2022 0307   CL 108 05/15/2022 0307   CO2 24 05/15/2022 0307   GLUCOSE 132 (H)  05/15/2022 0307   BUN 12 05/15/2022 0307   BUN 11 11/29/2020 1001   CREATININE 0.96 05/15/2022 0307   CALCIUM 8.6 (L) 05/15/2022 0307   GFRNONAA >60 05/15/2022 0307   GFRAA >60 09/06/2018 1103    CXR images personally reviewed> ETT in place, cardiomegaly, pacemaker noted, question RML infiltrate vs atelectasis  Impression/Plan: Acute encephalopathy, undetermined etiology, abrupt onset per wife: picture worrisome for primary neuro process (posterior circulation process? Midbrain or pons stroke?) as no clear infectious, metabolic or toxic cause at this point.  Continue to hold sedatives (home trazodone, morphine, , intubate for airway protection, MRI brain, neuro consult; consider LP Acute respiratory failure with hypoxemia due to inability to protect airway> intubate, full vent support Concern for aspiration> copious secretions with no gag reflex noted at time of initial assessment and intubation, cover with unasyn, check resp culture Chronic malnutrition> TPN per pharmacy BPH> hold flomax with OG tube, foley, consider bethanechol Short gut> simethicone, no tube feeding Anxiety/insomnia> holding buspar and trazodone and melatonin Chronic prostatitis> bactrim on hold for now, continue unasyn  My cc time 40 minutes  Roselie Awkward, MD [facility] Pager: [phone] Cell: [phone] After 7pm: [phone]

## 2022-05-15 NOTE — Progress Notes (Signed)
[name]  ZJI:[id number] DOB: Jan 12, 1951 DOA: 05/13/2022 PCP: [name], MD    Brief Narrative:  [phone] w/ a hx of Crohn's disease resulting in a complicated surgical history followed at [facility] to include left hemicolectomy and ileostomy with multiple other complications to include TNA dependence, chronic prostatitis, SVT, and asthma who was transported to the ER from his SNF ([facility]) the day prior to this admit w/ generalized weakness and acute worsening of his chronic abdom pain. He decided to return home instead of his SNF, and then the morning of this admit was found by his wife at home in an unresponsive state with multiple pill bottles open around him (name of pills and how many missing is unknown). EMS was summoned, and the patient was brought back to the ED 10/28. EMS confirmed normal CBG. Narcan given in the ED had no appreciable effect.  CT head was without acute findings.  CXR unrevealing.  He had a CT abdomen/pelvis 10/27 which revealed no acute findings.  UA unrevealing.  Ethanol level undetectable.  Acetaminophen and aspirin levels undetectable.  ABG without significant derangement.  UDS positive for opiates and benzos, both of which he is prescribed.  Afebrile.  Vital signs stable.   Of note the patient was admitted to the hospital 10/14 >05/03/2022 after treatment for hypotension abdominal pain and complications related to accidentally receiving a TNA bolus at his SNF.  Consultants:  None  Goals of Care:  Code Status: Full Code   DVT prophylaxis: Lovenox  Interim Hx: Patient remained lethargic throughout the day yesterday.  No acute events reported over night shift.  Afebrile.  Some tachypnea up to 45 bpm recorded this morning.  Blood pressure stable with saturations at 97% on room air. Slightly more responsive today meaning he will groan with sternal pressure or nail bed pressure, but does not open eyes and no spontaneous movement noted on exam.   Assessment &  Plan:  Obtundation of unclear etiology POA History suggests possible drug overdose, likely trazodone - other than obtundation the patient remains remarkably stable- CT head unrevealing at presentation - Narcan had no effect in the ER - EEG pending - TSH normal - B12 and folate not severely abnormal - ammonia normal - EKG without significant QTc derangement - telemetry thus far unimpressive - appears to be slowly waking up - if fails to significantly improve over the next 24 hours will repeat CT head tomorrow  Modestly low B12 B12 301 - supplement to goal of 400 or >   Crohn's disease with complex surgical history including diverting loop ileostomy April 2023 Abdom benign on exam - no apparent issues w/ ostomy - monitor clinically    Severe protein calorie malnutrition On chronic TNA - has a L chest central line - TNA resumption ordered    Recent dysuria with chronic prostatitis/BPH Was receiving a 28-day course of Bactrim DS prior to his admission - continue Flomax when able to to take orals   Bladder calculi To follow-up with Urology in the outpatient setting to consider transurethral calculi removal   Normocytic anemia Monitor Hgb trend    S/P PPM   GERD  Family Communication: no family present at time of exam today  Disposition: Remain inpatient until mental status significantly improved -anticipate return to SNF will be indicated  Objective: Blood pressure (!) 140/64, pulse (!) 103, temperature 97.7 F (36.5 C), temperature source Oral, resp. rate 17, height 5' 10"  (1.778 m), weight 67.2 kg, SpO2 97 %.  Intake/Output Summary (Last 24 hours) at 05/15/2022 0843 Last data filed at 05/15/2022 0608 Gross per 24 hour  Intake 1800.14 ml  Output 2050 ml  Net -249.86 ml    Filed Weights   05/13/22 1832  Weight: 67.2 kg    Examination: General: No acute respiratory distress - remains obtunded - groans with painful stimuli  Lungs: Clear to auscultation bilaterally - no  wheezing  Cardiovascular: Regular rate and rhythm without murmur - no rub  Abdomen: NT/ND, soft, bs+, no mass - ostomy in place w/ no apparent complications Extremities: No significant edema bilateral lower extremities Neuro: obtunded - no purposeful movement - pupils round and equal B and responsive to light - extremities flaccid - no response to DRT testing at patella - no Babinski B - no posturing   CBC: Recent Labs  Lab 05/12/22 0943 05/13/22 1212 05/14/22 0427 05/15/22 0307  WBC 9.2 5.5 8.0 17.7*  NEUTROABS 7.1  --   --   --   HGB 9.6* 9.8* 9.8* 10.2*  HCT 31.6* 32.6* 32.6* 33.6*  MCV 80.6 80.9 80.9 79.6*  PLT 160 164 180 163    Basic Metabolic Panel: Recent Labs  Lab 05/12/22 0943 05/13/22 1212 05/14/22 0427 05/15/22 0307  NA 134* 136 140 139  K 3.9 3.6 3.6 3.4*  CL 107 105 108 108  CO2 22 24 26 24   GLUCOSE 92 87 106* 132*  BUN 25* 18 15 12   CREATININE 0.94 0.99 0.88 0.96  CALCIUM 8.5* 9.0 8.7* 8.6*  MG 1.8  --  2.0 1.8  PHOS 3.0  --  2.7 2.4*    GFR: Estimated Creatinine Clearance: 69 mL/min (by C-G formula based on SCr of 0.96 mg/dL).   Scheduled Meds:  Chlorhexidine Gluconate Cloth  6 each Topical Daily   cyanocobalamin  1,000 mcg Subcutaneous Daily   enoxaparin (LOVENOX) injection  40 mg Subcutaneous Q24H   Continuous Infusions:  dextrose 5 % and 0.9% NaCl 75 mL/hr at 05/15/22 0601   potassium PHOSPHATE IVPB (in mmol)       LOS: 2 days   Cherene Altes, MD [facility] Office  [phone] Pager - Text Page per Shea Evans  If 7PM-7AM, please contact night-coverage per Amion 05/15/2022, 8:43 AM

## 2022-05-15 NOTE — Consult Note (Signed)
NAME:  [name], MRN:  [id number], DOB:  10-07-1950, LOS: 2 ADMISSION DATE:  05/13/2022, CONSULTATION DATE:  10/30 REFERRING MD:  Dr. Thereasa Solo, CHIEF COMPLAINT:  obtundation   History of Present Illness:  Patient is encephalopathic and/or intubated. Therefore history has been obtained from chart review.   71 year old male with complicated past medical history as outlined below, which is significant for Crohn's disease with persistent abdominal pain.  On April of this year he underwent exploratory laparotomy with small bowel resection, repair of enterotomy, and creation of a diverting loop ileostomy at [facility].  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  He has been residing in [facility] SNF from where he presented to the emergency department on 10/27 for pain at the site of his ostomy with foul-smelling discharge.  Work-up of the ostomy and abdomen including CT imaging was unremarkable.  He was discharged to SNF, however, rather than returning to SNF he found private transport to his home.  Then on 10/28 he again presented to [facility] long emergency department with chief complaint of unresponsiveness.  His wife is spoken to him earlier that morning, went downstairs to make him breakfast, and upon returning to his side he was unresponsive with open pill bottles nearby of which names and quantities were not known.  He did not respond to Narcan and ABG was unremarkable.  CT of the head was unremarkable.  He was admitted to the hospitalist service for ongoing care.  10/30 he remained unresponsive but developed tachypnea and paradoxical respirations prompting transfer to the ICU and consideration of intubation.  PCCM was consulted  Pertinent  Medical History   has a past medical history of Acute prostatitis (07/24/2007), Allergy, Arthritis, Asthma, Blood transfusion without reported diagnosis, BPH (benign prostatic hypertrophy) with urinary obstruction, Crohn's ileitis ([place]) suspected (05/03/2017),  Dilated aortic root ([place]), Diverticulitis of colon, EPIDIDYMITIS (02/15/2010), GERD (gastroesophageal reflux disease), H/O: GI bleed, Hemorrhoids, Hepatitis (1975), HERPES SIMPLEX INFECTION (10/14/2007), Hiatal hernia, Ileus following gastrointestinal surgery ([place]) (12/26/2011), Long term (current) use of systemic steroids (06/18/2018), Psoriasis, Recurrent ventral incisional hernia (05/10/2012), SVT (supraventricular tachycardia), and Ulcer (08/21/2013).   Significant Hospital Events: Including procedures, antibiotic start and stop dates in addition to other pertinent events     Interim History / Subjective:    Objective   Blood pressure 130/68, pulse 96, temperature 99.4 F (37.4 C), temperature source Oral, resp. rate 16, height 5' 10"  (1.778 m), weight 66.3 kg, SpO2 100 %.    Vent Mode: PRVC FiO2 (%):  [21 %-100 %] 100 % Set Rate:  [16 bmp] 16 bmp Vt Set:  [580 mL] 580 mL PEEP:  [5 cmH20] 5 cmH20 Plateau Pressure:  [14 cmH20] 14 cmH20   Intake/Output Summary (Last 24 hours) at 05/15/2022 1719 Last data filed at 05/15/2022 1600 Gross per 24 hour  Intake 2295.13 ml  Output 2510 ml  Net -214.87 ml   Filed Weights   05/13/22 1832 05/15/22 1400  Weight: 67.2 kg 66.3 kg    Examination: General: Frail elderly gentleman in moderate respiratory distress HENT: [place]/AT, PERRL, no JVD Lungs: Clear bilateral breath sounds Cardiovascular: RRR, no MRG Abdomen: Stoma observed unremarkable.  Extremities: No significant edema Neuro: Eyes open to voice, but unable to track. Does not follow commands.   Resolved Hospital Problem list     Assessment & Plan:   Altered mental status: obtunded upon arrival and marginal, if any, improvement in the last 24 hours. CT head negative. Found with open pill bottles. Known  to take opioids, benzos, and trazodone chronically. UDS positive for opioids and benzos. Did not respond to Narcan. EEG consistent with severe diffuse encephalopathy. Ammonia normal. TSH  normal. B12 and folate mildly reduced. - Intubate for airway protection - MRI brain - Minimize sedation. PRN only for RASS goal -1 to -2 - Monitor telemetry and QTC - Give B12  Acute respiratory failure with hypoxia. Possible aspiration - Unasyn - Full vent support - ABG pending - VAP bundle - SBT when appropriate  Chron's with complex surgical history Severe protein calorie malnutrition  - Continue TPN per pharmacy - Stoma care per RN  Chronic prostatitis - Continue outpatient course of bactrim - Resume Flomax once taking PO  Bladder calculi - To follow-up with Urology in the outpatient setting to consider transurethral calculi removal   Best Practice (right click and "Reselect all SmartList Selections" daily)   Diet/type: NPO DVT prophylaxis: LMWH GI prophylaxis: PPI Lines: Central line Foley:  Yes, and it is still needed Code Status:  full code Last date of multidisciplinary goals of care discussion [10/30]  Labs   CBC: Recent Labs  Lab 05/12/22 0943 05/13/22 1212 05/14/22 0427 05/15/22 0307  WBC 9.2 5.5 8.0 17.7*  NEUTROABS 7.1  --   --   --   HGB 9.6* 9.8* 9.8* 10.2*  HCT 31.6* 32.6* 32.6* 33.6*  MCV 80.6 80.9 80.9 79.6*  PLT 160 164 180 664    Basic Metabolic Panel: Recent Labs  Lab 05/12/22 0943 05/13/22 1212 05/14/22 0427 05/15/22 0307  NA 134* 136 140 139  K 3.9 3.6 3.6 3.4*  CL 107 105 108 108  CO2 22 24 26 24   GLUCOSE 92 87 106* 132*  BUN 25* 18 15 12   CREATININE 0.94 0.99 0.88 0.96  CALCIUM 8.5* 9.0 8.7* 8.6*  MG 1.8  --  2.0 1.8  PHOS 3.0  --  2.7 2.4*   GFR: Estimated Creatinine Clearance: 68.1 mL/min (by C-G formula based on SCr of 0.96 mg/dL). Recent Labs  Lab 05/12/22 0943 05/13/22 1212 05/14/22 0427 05/15/22 0307  WBC 9.2 5.5 8.0 17.7*    Liver Function Tests: Recent Labs  Lab 05/12/22 0943 05/13/22 1212 05/14/22 0427 05/15/22 0307  AST 22 23 22 18   ALT 15 16 16 16   ALKPHOS 84 87 90 84  BILITOT 0.6 0.8 0.6  0.9  PROT 6.9 6.8 6.7 7.0  ALBUMIN 3.5 3.4* 3.2* 3.2*   Recent Labs  Lab 05/12/22 0943  LIPASE 34   Recent Labs  Lab 05/14/22 0427  AMMONIA 26    ABG    Component Value Date/Time   PHART 7.37 05/13/2022 1221   PCO2ART 45 05/13/2022 1221   PO2ART 136 (H) 05/13/2022 1221   HCO3 26.0 05/13/2022 1221   TCO2 22.5 12/21/2011 0925   ACIDBASEDEF 1.0 12/21/2011 0355   O2SAT 100 05/13/2022 1221     Coagulation Profile: No results for input(s): "INR", "PROTIME" in the last 168 hours.  Cardiac Enzymes: No results for input(s): "CKTOTAL", "CKMB", "CKMBINDEX", "TROPONINI" in the last 168 hours.  HbA1C: No results found for: "HGBA1C"  CBG: Recent Labs  Lab 05/13/22 1200 05/15/22 1713  GLUCAP 78 99    Review of Systems:   Patient is encephalopathic and/or intubated. Therefore history has been obtained from chart review.    Past Medical History:  He,  has a past medical history of Acute prostatitis (07/24/2007), Allergy, Arthritis, Asthma, Blood transfusion without reported diagnosis, BPH (benign prostatic hypertrophy) with urinary obstruction, Crohn's  ileitis ([place]) suspected (05/03/2017), Dilated aortic root ([place]), Diverticulitis of colon, EPIDIDYMITIS (02/15/2010), GERD (gastroesophageal reflux disease), H/O: GI bleed, Hemorrhoids, Hepatitis (1975), HERPES SIMPLEX INFECTION (10/14/2007), Hiatal hernia, Ileus following gastrointestinal surgery ([place]) (12/26/2011), Long term (current) use of systemic steroids (06/18/2018), Psoriasis, Recurrent ventral incisional hernia (05/10/2012), SVT (supraventricular tachycardia), and Ulcer (08/21/2013).   Surgical History:   Past Surgical History:  Procedure Laterality Date   BOWEL RESECTION  12/19/2011   Procedure: SMALL BOWEL RESECTION;  Surgeon: [name], MD;  Location: [facility];  Service: General;  Laterality: N/A;  with anastamosis and insertion mesh   BRONCHOSCOPY     COLON SURGERY  01/2004   x 2   COLONOSCOPY W/ BIOPSIES   04/26/2017   per [name], no polyps, benign inflammation, repeat in 5 yrs    CYSTOSCOPY     ESOPHAGOGASTRODUODENOSCOPY     HEMICOLECTOMY     left side, at [facility], diverticulitis   [place]     [phone] incisional hernia   ILEOSTOMY     ILEOSTOMY CLOSURE     INSERTION OF MESH  07/31/2012   Procedure: INSERTION OF MESH;  Surgeon: [name], MD;  Location: [facility];  Service: General;;   LAPAROTOMY  12/19/2011   Procedure: EXPLORATORY LAPAROTOMY;  Surgeon: [name], MD;  Location: [facility];  Service: General;  Laterality: N/A;   PACEMAKER IMPLANT N/A 12/01/2020   Procedure: PACEMAKER IMPLANT;  Surgeon: [name], MD;  Location: [facility];  Service: Cardiovascular;  Laterality: N/A;   PACEMAKER INSERTION Left    TONSILLECTOMY     UPPER GASTROINTESTINAL ENDOSCOPY     VASECTOMY     VENTRAL HERNIA REPAIR  07/31/2012   Procedure: HERNIA REPAIR VENTRAL ADULT;  Surgeon: [name], MD;  Location: [facility];  Service: General;  Laterality: N/A;     Social History:   reports that he has never smoked. He has never used smokeless tobacco. He reports that he does not currently use alcohol. He reports current drug use. Drug: Oxycodone.   Family History:  His family history includes Heart attack in his father; Heart disease in his father; Hypertension in his father; Leukemia in his father; Lung cancer in his mother; Prostate cancer in his father and paternal uncle. There is no history of Colon cancer, Stomach cancer, Colon polyps, Esophageal cancer, or Rectal cancer.   Allergies Allergies  Allergen Reactions   Purinethol [Mercaptopurine] Other (See Comments)    Pancreatitis    Shellfish Allergy Anaphylaxis and Swelling   Humira [Adalimumab] Other (See Comments)    Developed antibodies   Tape Rash   Wound Dressing Adhesive Rash     Home Medications  Prior to Admission medications   Medication Sig Start Date End Date  Taking? Authorizing Provider  acetaminophen (TYLENOL) 325 MG tablet Take 650 mg by mouth every 6 (six) hours as needed for moderate pain.   Yes [provider]  albuterol (PROVENTIL HFA;VENTOLIN HFA) 108 (90 Base) MCG/ACT inhaler INHALE 2 PUFFS INTO THE LUNGS EVERY 4 (FOUR) HOURS AS NEEDED FOR WHEEZING OR SHORTNESS OF BREATH. 10/24/18  Yes [name], MD  Amino Ac Elect-Calc in D15W [facility] E/DEXTROSE, 5/15, IV) Inject 65 mL/hr into the vein continuous. Via powerline TPN IV (104gm aa/183 GM DEC/50GM)   Yes [provider]  busPIRone (BUSPAR) 5 MG tablet Take 5 mg by mouth 3 (three) times daily.   Yes [provider]  calcium carbonate (TUMS - DOSED IN MG ELEMENTAL CALCIUM) 500 MG chewable tablet Chew 500 mg by mouth 3 (three) times daily as needed for indigestion or heartburn.   Yes [provider]  melatonin 3 MG TABS tablet Take 3 mg by mouth at bedtime.   Yes [provider]  METAMUCIL FIBER PO Take 1 packet by mouth in the morning.   Yes [provider]  morphine (MSIR) 15 MG tablet Take 15 mg by mouth every 4 (four) hours as needed for severe pain.   Yes [provider]  Multiple Vitamin (MULTIVITAMIN WITH MINERALS) TABS tablet Take 1 tablet by mouth daily.   Yes [provider]  omeprazole (PRILOSEC) 20 MG capsule Take 20 mg by mouth in the morning.   Yes [provider]  ondansetron (ZOFRAN-ODT) 4 MG disintegrating tablet Take 4 mg by mouth every 6 (six) hours as needed for nausea or vomiting.   Yes [provider]  OVER THE COUNTER MEDICATION Take 120 mLs by mouth See admin instructions. Medpass 136m twice daily   Yes [provider]  oxybutynin (DITROPAN) 5 MG tablet Take 1 tablet (5 mg total) by mouth 3 (three) times daily. 05/03/22  Yes Shalhoub, GSherryll Burger MD  simethicone (MYLICON) 80 MG chewable tablet Chew 80 mg by mouth 4 (four) times daily -  with meals and at bedtime.   Yes [provider]  sulfamethoxazole-trimethoprim (BACTRIM DS) 800-160 MG tablet Take 1 tablet by mouth every 12 (twelve) hours for 25 days. Patient taking differently: Take 1 tablet by mouth every 12 (twelve) hours. 25 day course. From 10/18-11/12 05/03/22 05/28/22 Yes Shalhoub, GSherryll Burger MD  tamsulosin (FLOMAX) 0.4 MG CAPS capsule Take 1 capsule (0.4 mg total) by mouth daily after supper. 05/03/22  Yes Shalhoub, GSherryll Burger MD  traZODone (DESYREL) 50 MG tablet Take 100 mg by mouth at bedtime.   Yes [provider]     Critical care time: 43 minutes     PGeorgann Housekeeper AGACNP-BC [facility]  See Amion for personal pager PCCM on call pager ([phone] 7pm. Please call Elink 7p-7a. [phone] 05/15/2022 6:22 PM

## 2022-05-16 ENCOUNTER — Inpatient Hospital Stay (HOSPITAL_COMMUNITY): Payer: Medicare Other

## 2022-05-16 DIAGNOSIS — J9601 Acute respiratory failure with hypoxia: Secondary | ICD-10-CM | POA: Diagnosis present

## 2022-05-16 DIAGNOSIS — G9341 Metabolic encephalopathy: Secondary | ICD-10-CM | POA: Diagnosis present

## 2022-05-16 DIAGNOSIS — D649 Anemia, unspecified: Secondary | ICD-10-CM | POA: Diagnosis present

## 2022-05-16 DIAGNOSIS — R401 Stupor: Secondary | ICD-10-CM | POA: Diagnosis not present

## 2022-05-16 LAB — GLUCOSE, CAPILLARY
Glucose-Capillary: 122 mg/dL — ABNORMAL HIGH (ref 70–99)
Glucose-Capillary: 155 mg/dL — ABNORMAL HIGH (ref 70–99)
Glucose-Capillary: 118 mg/dL — ABNORMAL HIGH (ref 70–99)
Glucose-Capillary: 97 mg/dL (ref 70–99)
Glucose-Capillary: 123 mg/dL — ABNORMAL HIGH (ref 70–99)
Glucose-Capillary: 74 mg/dL (ref 70–99)

## 2022-05-16 LAB — CBC
HCT: 34 % — ABNORMAL LOW (ref 39.0–52.0)
Hemoglobin: 10.2 g/dL — ABNORMAL LOW (ref 13.0–17.0)
MCH: 24 pg — ABNORMAL LOW (ref 26.0–34.0)
MCHC: 30 g/dL (ref 30.0–36.0)
MCV: 80 fL (ref 80.0–100.0)
Platelets: 180 10*3/uL (ref 150–400)
RBC: 4.25 MIL/uL (ref 4.22–5.81)
RDW: 18.6 % — ABNORMAL HIGH (ref 11.5–15.5)
WBC: 18.1 10*3/uL — ABNORMAL HIGH (ref 4.0–10.5)
nRBC: 0 % (ref 0.0–0.2)

## 2022-05-16 LAB — BASIC METABOLIC PANEL
Anion gap: 6 (ref 5–15)
Anion gap: 5 (ref 5–15)
BUN: 20 mg/dL (ref 8–23)
BUN: 21 mg/dL (ref 8–23)
CO2: 25 mmol/L (ref 22–32)
CO2: 24 mmol/L (ref 22–32)
Calcium: 8.4 mg/dL — ABNORMAL LOW (ref 8.9–10.3)
Calcium: 8.3 mg/dL — ABNORMAL LOW (ref 8.9–10.3)
Chloride: 109 mmol/L (ref 98–111)
Chloride: 110 mmol/L (ref 98–111)
Creatinine, Ser: 0.85 mg/dL (ref 0.61–1.24)
Creatinine, Ser: 0.78 mg/dL (ref 0.61–1.24)
GFR, Estimated: >60 mL/min (ref >60)
GFR, Estimated: >60 mL/min (ref >60)
Glucose, Bld: 133 mg/dL — ABNORMAL HIGH (ref 70–99)
Glucose, Bld: 127 mg/dL — ABNORMAL HIGH (ref 70–99)
Potassium: 3.4 mmol/L — ABNORMAL LOW (ref 3.5–5.1)
Potassium: 3.1 mmol/L — ABNORMAL LOW (ref 3.5–5.1)
Sodium: 140 mmol/L (ref 135–145)
Sodium: 139 mmol/L (ref 135–145)

## 2022-05-16 LAB — MAGNESIUM: Magnesium: 2.4 mg/dL (ref 1.7–2.4)

## 2022-05-16 MED ORDER — INSULIN ASPART 100 UNIT/ML IJ SOLN
0.0000 [IU] | Freq: Three times a day (TID) | INTRAMUSCULAR | Status: DC
  Administered 2022-05-16 – 2022-05-17 (×2): 1 [IU] via SUBCUTANEOUS

## 2022-05-16 MED ORDER — DILTIAZEM HCL-DEXTROSE 125-5 MG/125ML-% IV SOLN (PREMIX)
INTRAVENOUS | Status: AC
  Administered 2022-05-16: 15 mg via INTRAVENOUS
  Filled 2022-05-16: qty 125

## 2022-05-16 MED ORDER — TRAVASOL 10 % IV SOLN
INTRAVENOUS | Status: AC
  Filled 2022-05-16: qty 1224

## 2022-05-16 MED ORDER — AMIODARONE HCL IN DEXTROSE 360-4.14 MG/200ML-% IV SOLN
60.0000 mg/h | INTRAVENOUS | Status: AC
  Administered 2022-05-16 (×2): 60 mg/h via INTRAVENOUS
  Filled 2022-05-16: qty 200

## 2022-05-16 MED ORDER — POTASSIUM CHLORIDE 10 MEQ/50ML IV SOLN
10.0000 meq | INTRAVENOUS | Status: AC
  Administered 2022-05-16 (×2): 10 meq via INTRAVENOUS
  Filled 2022-05-16 (×2): qty 50

## 2022-05-16 MED ORDER — AMIODARONE LOAD VIA INFUSION
150.0000 mg | Freq: Once | INTRAVENOUS | Status: AC
  Administered 2022-05-16: 150 mg via INTRAVENOUS
  Filled 2022-05-16: qty 83.34

## 2022-05-16 MED ORDER — DILTIAZEM LOAD VIA INFUSION
15.0000 mg | Freq: Once | INTRAVENOUS | Status: AC
  Filled 2022-05-16: qty 15

## 2022-05-16 MED ORDER — AMIODARONE HCL IN DEXTROSE 360-4.14 MG/200ML-% IV SOLN
30.0000 mg/h | INTRAVENOUS | Status: DC
  Administered 2022-05-17 – 2022-05-18 (×3): 30 mg/h via INTRAVENOUS
  Filled 2022-05-16 (×4): qty 200

## 2022-05-16 MED ORDER — DILTIAZEM HCL-DEXTROSE 125-5 MG/125ML-% IV SOLN (PREMIX)
5.0000 mg/h | INTRAVENOUS | Status: DC
  Administered 2022-05-16: 5 mg/h via INTRAVENOUS

## 2022-05-16 MED ORDER — TRAVASOL 10 % IV SOLN: INTRAVENOUS | Status: DC

## 2022-05-16 MED ORDER — POTASSIUM CHLORIDE 10 MEQ/100ML IV SOLN
10.0000 meq | INTRAVENOUS | Status: AC
  Administered 2022-05-16 (×4): 10 meq via INTRAVENOUS
  Filled 2022-05-16 (×4): qty 100

## 2022-05-16 NOTE — Progress Notes (Signed)
[facility] PCCM  Notified that he has Atrial fibrillation with RVR Confirmed on 12 lead EKG MAP 90's Start dilt drip with infusion per order set  Roselie Awkward, MD [facility] PCCM Pager: [phone] Cell: [phone] After 7:00 pm call Elink  [phone]

## 2022-05-16 NOTE — Progress Notes (Signed)
Monitor reading runs of SVT. Assessed patient, EKG read Atrial fibrillation with RVR. Notified MD of this finding and BP of 113/87 (map of 93). Orders were placed to start Diltiazem drip. Drip started.

## 2022-05-16 NOTE — Progress Notes (Signed)
Spiritual Care Note  Followed up with family (son, DIL, grandson [name] [69m, sister) at bedside. They are eagerly awaiting MRI results and are providing support for one another during this time of waiting for information or changes.  Provided pastoral presence and supportive listening. Following family for support.   CLoco Hills MDiv, [facility] 24/7 pager [phone]

## 2022-05-16 NOTE — Progress Notes (Signed)
Spiritual Care Note  Referred by [name] former wife [name] for emotional support for [name]. Visited at bedside while [name] was intubated and sleeping. [name] (and, later, [name]'s brother-in-law) was welcoming of [name] and used opportunity to share and process his dad's updates and to review sources of meaning and strength in his life.  Provided pastoral presence, empathic listening, and emotional support. Family is aware of ongoing chaplain availability. Also plan to stop by for another pastoral check-in this afternoon.   05/16/22 1200  Clinical Encounter Type  Visited With Family;Patient not available ([name], [place])  Visit Type Initial;Spiritual support;Social support  Referral From [facility] Encounters  Spiritual Needs Emotional    Chaplain Shelva Majestic, [facility] Pager [phone] Voicemail [phone]

## 2022-05-16 NOTE — Progress Notes (Signed)
K 3.4. Not replaced d/t potassium in TPN

## 2022-05-16 NOTE — Progress Notes (Signed)
PHARMACY - TOTAL PARENTERAL NUTRITION CONSULT NOTE   Indication: Crohn's disease and complex past surgical history including ex lap with small bowel resection, lysis of adhesion, mesh explantation, and diverting loop ileostomy in April 2023, on TPN prior to admission  Patient Measurements: Height: 5' 10"  Height: 5' 10"  (177.8 cm) Weight: 64.7 kg (142 lb 10.2 oz) IBW/kg (Calculated) : 73   Body mass index is 20.47 kg/m.  Assessment: Patient with PMH of Crohn's disease and complex surgical history as above on chronic TPN who presented on 05/13/22 with altered mental status after spouse found patient unresponsive with multiple pill bottles open around him. Patient was transported from SNF to ED the day prior for generalized weakness and acute worsening of his chronic abdominal pain. Patient decided to return home instead of his SNF. Pharmacy consulted to resume TPN as inpatient.   Discharge from [place] 04/19/22:  TPN pharmacist at [facility]:  Estimated Nutrient Requirements: Protein: 1.5 - 1.7 g/kg/day (98 - 104 g/day), Total kcal: 30 - 35 kcal/kg/day (1830 - 1983 kcal/day)  TPN Formulation at [facility] per TPN pharmacist: [A 6.6%, D 20%, F 3.1%] @ goal rate, 65 mL/hr (TV: 1560 mL/day) provides: Protein 1.7 g/kg/day 103 g/day, Total kcal 32 kcal/kg/day 1971 kcal/day  NPC: N ratio 94   Admitted at [facility] 04/29/22-05/03/22: Nutritional Goals: TPN 80 mL/hr (provides 113 g of protein and 1943 kcals per day) RD Assessment: (10/16): 2100-2450kcal (30-35kcal/kg), Protein 105-130g (1.5-1.8g/kg) Utilize intralipid rather than SMOF as 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  [facility]: [name], PharmD, ph: [phone] 10/28 Most recent formula: Total volume 1560 mL:  Dextrose 220 g/day, Plenamine 120 g/day, SMOFlipid 50 g/day, NaCl 75 mEq, CaGluc 6 mEq, Potassium Ac 20 mEq, Potassium Phos 13.5 nM, Mag Sulfate 8 mEq, Vitamins/trace  elements  Glucose / Insulin: No hx DM -CBGs < 180 Electrolytes: K low Renal: SCr, BUN WNL Hepatic: LFTs, T.bili WNL. TG WNL. Albumin low at 3.2.  I/O: adequate UOP, ileostomy output.  GI Imaging: - 10/27 CTa/p: no significant interval change since 10/14 CT. No evidence of bowel obstruction or other complicating feature at the anastomotic site or ostomy. - 10/14 CTa/p: previous surgical changes noted (transverse-sigmoid anastomosis) with stable inflammation since 10/12 CT GI Surgeries / Procedures: n/a  Central access: prior to admission - double lumen CVC TPN start date: started PTA  Nutritional Goals: TPN goal rate is 85 mL/hr and provides 122 g of protein and 2092 kcals per day.  RD Assessment this admission: 10/30  Estimated Needs Total Energy Estimated Needs: 2015-2350kcal (30-35kcal/kg) Total Protein Estimated Needs: 100-135 (1.5-2.0g/kg) Total Fluid Estimated Needs: 1700-2122m  Current Nutrition:  NPO  Plan:  Now, KCl 10 mEq IV x 2 runs At 1800, increase TPN to 85 mL/hr Electrolytes in TPN: Na 45 mEq/L, K 35 mEq/L, Ca 5 mEq/L, Mg 5 mEq/L, and Phos 15 mmol/L. Cl:Ac 1:1 Add standard MVI and trace elements to TPN Start Sensitive q8h SSI and adjust as needed  [facility] labs on Mon/Thurs at minimum, f/u labs tomorrow since we are adjusting TPN   ATawnya Crook PharmD, BCPS Clinical Pharmacist 05/16/2022 9:04 AM

## 2022-05-16 NOTE — Progress Notes (Signed)
NAME:  [name], MRN:  [id number], DOB:  [birth year], LOS: 3 ADMISSION DATE:  05/13/2022, CONSULTATION DATE:  10/30 REFERRING MD:  Dr. Thereasa Solo, CHIEF COMPLAINT:  obtundation   History of Present Illness:  71 year old male with complicated past medical history as outlined below, which is significant for Crohn's disease with persistent abdominal pain.  On April of this year he underwent exploratory laparotomy with small bowel resection, repair of enterotomy, and creation of a diverting loop ileostomy at [facility].  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  He has been residing in [facility] SNF from where he presented to the emergency department on 10/27 for pain at the site of his ostomy with foul-smelling discharge.  Work-up of the ostomy and abdomen including CT imaging was unremarkable.  He was discharged to SNF, however, rather than returning to SNF he found private transport to his home.  Then on 10/28 he again presented to [facility] long emergency department with chief complaint of unresponsiveness.  His wife is spoken to him earlier that morning, went downstairs to make him breakfast, and upon returning to his side he was unresponsive with open pill bottles nearby of which names and quantities were not known.  He did not respond to Narcan and ABG was unremarkable.  CT of the head was unremarkable.  He was admitted to the hospitalist service for ongoing care.  10/30 he remained unresponsive but developed tachypnea and paradoxical respirations prompting transfer to the ICU and consideration of intubation.  PCCM was consulted  Pertinent  Medical History   has a past medical history of Acute prostatitis (07/24/2007), Allergy, Arthritis, Asthma, Blood transfusion without reported diagnosis, BPH (benign prostatic hypertrophy) with urinary obstruction, Crohn's ileitis ([place]) suspected (05/03/2017), Dilated aortic root ([place]), Diverticulitis of colon, EPIDIDYMITIS (02/15/2010), GERD (gastroesophageal  reflux disease), H/O: GI bleed, Hemorrhoids, Hepatitis (1975), HERPES SIMPLEX INFECTION (10/14/2007), Hiatal hernia, Ileus following gastrointestinal surgery ([place]) (12/26/2011), Long term (current) use of systemic steroids (06/18/2018), Psoriasis, Recurrent ventral incisional hernia (05/10/2012), SVT (supraventricular tachycardia), and Ulcer (08/21/2013).   Significant Hospital Events: Including procedures, antibiotic start and stop dates in addition to other pertinent events   10/28 admit obtunded found down 10/30 tx to ICU for intubation 10/31 waking up and following commands overnight.   Interim History / Subjective:  Reportedly he was waking up and following commands overnight. Did receive additional sedation so currently has low RASS.   Objective   Blood pressure (!) 151/67, pulse (!) 105, temperature 98.8 F (37.1 C), temperature source Axillary, resp. rate (!) 31, height 5' 10"  (1.778 m), weight 64.7 kg, SpO2 100 %.    Vent Mode: PSV;CPAP FiO2 (%):  [40 %-100 %] 40 % Set Rate:  [16 bmp] 16 bmp Vt Set:  [580 mL] 580 mL PEEP:  [5 cmH20] 5 cmH20 Pressure Support:  [5 cmH20] 5 cmH20 Plateau Pressure:  [14 cmH20-15 cmH20] 15 cmH20   Intake/Output Summary (Last 24 hours) at 05/16/2022 0807 Last data filed at 05/16/2022 0615 Gross per 24 hour  Intake 3008.08 ml  Output 2715 ml  Net 293.08 ml    Filed Weights   05/13/22 1832 05/15/22 1400 05/15/22 1727  Weight: 67.2 kg 66.3 kg 64.7 kg    Examination: General: Frail elderly appearing gentleman in NAD HENT: Trooper/AT, PERRL, no JVD Lungs: Clear bilateral breath sounds Cardiovascular: RRR, no MRG Abdomen: Stoma unremarkable. Soft, non-distended Extremities: No significant edema Neuro: RASS -3.   Resolved Hospital Problem list     Assessment & Plan:  Altered mental status: obtunded upon arrival and marginal, if any, improvement in the last 24 hours. CT head negative. Found with open pill bottles. Known to take opioids, benzos,  and trazodone chronically. UDS positive for opioids and benzos. Did not respond to Narcan. EEG consistent with severe diffuse encephalopathy. Ammonia normal. TSH normal. B12 and folate mildly reduced.  Reportedly waking up and following commands overnight - No additional sedatives - SBT now, tolerating. Awaiting further wakefulness - MRI pending, but can likely DC if he is able to wake up. Will monitor - Continue B12 supplementation - hold home trazodone, morphine  Acute respiratory failure with hypoxia. Possible aspiration - Unasyn - Full vent support -VAP bundle - SBT  Chron's with complex surgical history Severe protein calorie malnutrition  Short gut - Continue TPN per pharmacy - Stoma care per RN - no tube feeding  Hypokalemia - pharmacy to adjust TPN as indicated.   Chronic prostatitis - Hold outpatient course of bactrim - Resume Flomax once taking PO  Bladder calculi - To follow-up with Urology in the outpatient setting to consider transurethral calculi removal   Best Practice (right click and "Reselect all SmartList Selections" daily)   Diet/type: NPO DVT prophylaxis: LMWH GI prophylaxis: PPI Lines: Central line Foley:  Yes, and it is still needed Code Status:  full code Last date of multidisciplinary goals of care discussion [10/30]  Labs   CBC: Recent Labs  Lab 05/12/22 0943 05/13/22 1212 05/14/22 0427 05/15/22 0307 05/16/22 0446  WBC 9.2 5.5 8.0 17.7* 18.1*  NEUTROABS 7.1  --   --   --   --   HGB 9.6* 9.8* 9.8* 10.2* 10.2*  HCT 31.6* 32.6* 32.6* 33.6* 34.0*  MCV 80.6 80.9 80.9 79.6* 80.0  PLT 160 164 180 192 180     Basic Metabolic Panel: Recent Labs  Lab 05/12/22 0943 05/13/22 1212 05/14/22 0427 05/15/22 0307 05/15/22 1956 05/16/22 0446  NA 134* 136 140 139 140 140  K 3.9 3.6 3.6 3.4* 3.0* 3.4*  CL 107 105 108 108 111 109  CO2 22 24 26 24 23 25   GLUCOSE 92 87 106* 132* 401* 133*  BUN 25* 18 15 12 16 20   CREATININE 0.94 0.99 0.88  0.96 1.01 0.85  CALCIUM 8.5* 9.0 8.7* 8.6* 8.1* 8.4*  MG 1.8  --  2.0 1.8 1.8  --   PHOS 3.0  --  2.7 2.4*  --   --     GFR: Estimated Creatinine Clearance: 75.1 mL/min (by C-G formula based on SCr of 0.85 mg/dL). Recent Labs  Lab 05/13/22 1212 05/14/22 0427 05/15/22 0307 05/16/22 0446  WBC 5.5 8.0 17.7* 18.1*     Liver Function Tests: Recent Labs  Lab 05/12/22 0943 05/13/22 1212 05/14/22 0427 05/15/22 0307  AST 22 23 22 18   ALT 15 16 16 16   ALKPHOS 84 87 90 84  BILITOT 0.6 0.8 0.6 0.9  PROT 6.9 6.8 6.7 7.0  ALBUMIN 3.5 3.4* 3.2* 3.2*    Recent Labs  Lab 05/12/22 0943  LIPASE 34    Recent Labs  Lab 05/14/22 0427  AMMONIA 26     ABG    Component Value Date/Time   PHART 7.43 05/15/2022 1822   PCO2ART 38 05/15/2022 1822   PO2ART 196 (H) 05/15/2022 1822   HCO3 24.9 05/15/2022 1822   TCO2 22.5 12/21/2011 0925   ACIDBASEDEF 1.0 12/21/2011 0355   O2SAT 82.4 05/15/2022 1900     Coagulation Profile: No results for input(s): "INR", "  PROTIME" in the last 168 hours.  Cardiac Enzymes: No results for input(s): "CKTOTAL", "CKMB", "CKMBINDEX", "TROPONINI" in the last 168 hours.  HbA1C: No results found for: "HGBA1C"  CBG: Recent Labs  Lab 05/13/22 1200 05/15/22 1713 05/15/22 1939 05/15/22 2357  GLUCAP 78 99 179* 116*     Review of Systems:   Patient is encephalopathic and/or intubated. Therefore history has been obtained from chart review.    Past Medical History:  He,  has a past medical history of Acute prostatitis (07/24/2007), Allergy, Arthritis, Asthma, Blood transfusion without reported diagnosis, BPH (benign prostatic hypertrophy) with urinary obstruction, Crohn's ileitis ([place]) suspected (05/03/2017), Dilated aortic root ([place]), Diverticulitis of colon, EPIDIDYMITIS (02/15/2010), GERD (gastroesophageal reflux disease), H/O: GI bleed, Hemorrhoids, Hepatitis (1975), HERPES SIMPLEX INFECTION (10/14/2007), Hiatal hernia, Ileus following gastrointestinal  surgery ([place]) (12/26/2011), Long term (current) use of systemic steroids (06/18/2018), Psoriasis, Recurrent ventral incisional hernia (05/10/2012), SVT (supraventricular tachycardia), and Ulcer (08/21/2013).   Surgical History:   Past Surgical History:  Procedure Laterality Date   BOWEL RESECTION  12/19/2011   Procedure: SMALL BOWEL RESECTION;  Surgeon: [name], MD;  Location: [facility] ORS;  Service: General;  Laterality: N/A;  with anastamosis and insertion mesh   BRONCHOSCOPY     COLON SURGERY  01/2004   x 2   COLONOSCOPY W/ BIOPSIES  04/26/2017   per [name], no polyps, benign inflammation, repeat in 5 yrs    CYSTOSCOPY     ESOPHAGOGASTRODUODENOSCOPY     HEMICOLECTOMY     left side, at [facility], diverticulitis   [place]     [phone] incisional hernia   ILEOSTOMY     ILEOSTOMY CLOSURE     INSERTION OF MESH  07/31/2012   Procedure: INSERTION OF MESH;  Surgeon: [name], MD;  Location: [facility];  Service: General;;   LAPAROTOMY  12/19/2011   Procedure: EXPLORATORY LAPAROTOMY;  Surgeon: [name], MD;  Location: [facility];  Service: General;  Laterality: N/A;   PACEMAKER IMPLANT N/A 12/01/2020   Procedure: PACEMAKER IMPLANT;  Surgeon: [name], MD;  Location: [place] CV LAB;  Service: Cardiovascular;  Laterality: N/A;   PACEMAKER INSERTION Left    TONSILLECTOMY     UPPER GASTROINTESTINAL ENDOSCOPY     VASECTOMY     VENTRAL HERNIA REPAIR  07/31/2012   Procedure: HERNIA REPAIR VENTRAL ADULT;  Surgeon: [name], MD;  Location: [facility];  Service: General;  Laterality: N/A;     Social History:   reports that he has never smoked. He has never used smokeless tobacco. He reports that he does not currently use alcohol. He reports current drug use. Drug: Oxycodone.   Family History:  His family history includes Heart attack in his father; Heart disease in his father; Hypertension in his father; Leukemia in his  father; Lung cancer in his mother; Prostate cancer in his father and paternal uncle. There is no history of Colon cancer, Stomach cancer, Colon polyps, Esophageal cancer, or Rectal cancer.   Allergies Allergies  Allergen Reactions   Purinethol [Mercaptopurine] Other (See Comments)    Pancreatitis    Shellfish Allergy Anaphylaxis and Swelling   Humira [Adalimumab] Other (See Comments)    Developed antibodies   Tape Rash   Wound Dressing Adhesive Rash     Home Medications  Prior to Admission medications   Medication Sig Start Date End Date Taking? Authorizing Provider  acetaminophen (TYLENOL) 325 MG tablet Take 650 mg  by mouth every 6 (six) hours as needed for moderate pain.   Yes [provider]  albuterol (PROVENTIL HFA;VENTOLIN HFA) 108 (90 Base) MCG/ACT inhaler INHALE 2 PUFFS INTO THE LUNGS EVERY 4 (FOUR) HOURS AS NEEDED FOR WHEEZING OR SHORTNESS OF BREATH. 10/24/18  Yes [name], MD  Amino Ac Elect-Calc in D15W [facility] E/DEXTROSE, 5/15, IV) Inject 65 mL/hr into the vein continuous. Via powerline TPN IV (104gm aa/183 GM DEC/50GM)   Yes [provider]  busPIRone (BUSPAR) 5 MG tablet Take 5 mg by mouth 3 (three) times daily.   Yes [provider]  calcium carbonate (TUMS - DOSED IN MG ELEMENTAL CALCIUM) 500 MG chewable tablet Chew 500 mg by mouth 3 (three) times daily as needed for indigestion or heartburn.   Yes [provider]  melatonin 3 MG TABS tablet Take 3 mg by mouth at bedtime.   Yes [provider]  METAMUCIL FIBER PO Take 1 packet by mouth in the morning.   Yes [provider]  morphine (MSIR) 15 MG tablet Take 15 mg by mouth every 4 (four) hours as needed for severe pain.   Yes [provider]  Multiple Vitamin (MULTIVITAMIN WITH MINERALS) TABS tablet Take 1 tablet by mouth daily.   Yes [provider]  omeprazole (PRILOSEC) 20 MG capsule Take 20 mg by mouth in the morning.   Yes [provider]  ondansetron (ZOFRAN-ODT) 4 MG disintegrating tablet Take 4 mg by mouth every 6 (six) hours as needed for nausea or vomiting.   Yes [provider]  OVER THE COUNTER MEDICATION Take 120 mLs by mouth See admin instructions. Medpass 169m twice daily   Yes [provider]  oxybutynin (DITROPAN) 5 MG tablet Take 1 tablet (5 mg total) by mouth 3 (three) times daily. 05/03/22  Yes Shalhoub, GSherryll Burger MD  simethicone (MYLICON) 80 MG chewable tablet Chew 80 mg by mouth 4 (four) times daily -  with meals and at bedtime.   Yes [provider]  sulfamethoxazole-trimethoprim (BACTRIM DS) 800-160 MG tablet Take 1 tablet by mouth every 12 (twelve) hours for 25 days. Patient taking differently: Take 1 tablet by mouth every 12 (twelve) hours. 25 day course. From 10/18-11/12 05/03/22 05/28/22 Yes Shalhoub, GSherryll Burger MD  tamsulosin (FLOMAX) 0.4 MG CAPS capsule Take 1 capsule (0.4 mg total) by mouth daily after supper. 05/03/22  Yes Shalhoub, GSherryll Burger MD  traZODone (DESYREL) 50 MG tablet Take 100 mg by mouth at bedtime.   Yes [provider]     Critical care time: 37 minutes     PGeorgann Housekeeper AGACNP-BC [facility]  See Amion for personal pager PCCM on call pager [phone] 7pm. Please call Elink 7p-7a. [phone] 05/16/2022 8:07 AM

## 2022-05-16 NOTE — Progress Notes (Signed)
[facility] PCCM  Pressures dropped after dilt bolus  Remains in RVR rates 140s. Transition to amio bolus/drip   Georgann Housekeeper, AGACNP-BC [facility]  See Amion for personal pager PCCM on call pager [phone] until 7pm. Please call Elink 7p-7a. [phone]  05/16/2022 7:07 PM

## 2022-05-16 NOTE — Progress Notes (Addendum)
  Amiodarone Drug - Drug Interaction Consult Note  Recommendations: Prior to admission meds include trazodoen.  Use caution or avoid use of Trazodone which may increase risk of QT prolongation.  If concomitant therapy is required, closely monitor for QT prolongation and keep magnesium/potassium WNL.  Review medication list daily for any new medications and drug interactions with amiodarone.   Amiodarone is metabolized by the cytochrome P450 system and therefore has the potential to cause many drug interactions. Amiodarone has an average plasma half-life of 50 days (range 20 to 100 days).   There is potential for drug interactions to occur several weeks or months after stopping treatment and the onset of drug interactions may be slow after initiating amiodarone.   []  Statins: Increased risk of myopathy. Simvastatin- restrict dose to 35m daily. Other statins: counsel patients to report any muscle pain or weakness immediately.  []  Anticoagulants: Amiodarone can increase anticoagulant effect. Consider warfarin dose reduction. Patients should be monitored closely and the dose of anticoagulant altered accordingly, remembering that amiodarone levels take several weeks to stabilize.  []  Antiepileptics: Amiodarone can increase plasma concentration of phenytoin, the dose should be reduced. Note that small changes in phenytoin dose can result in large changes in levels. Monitor patient and counsel on signs of toxicity.  []  Beta blockers: increased risk of bradycardia, AV block and myocardial depression. Sotalol - avoid concomitant use.  []   Calcium channel blockers (diltiazem and verapamil): increased risk of bradycardia, AV block and myocardial depression.  []   Cyclosporine: Amiodarone increases levels of cyclosporine. Reduced dose of cyclosporine is recommended.  []  Digoxin dose should be halved when amiodarone is started.  []  Diuretics: increased risk of cardiotoxicity if hypokalemia  occurs.  []  Oral hypoglycemic agents (glyburide, glipizide, glimepiride): increased risk of hypoglycemia. Patient's glucose levels should be monitored closely when initiating amiodarone therapy.   [x]  Drugs that prolong the QT interval:  Torsades de pointes risk may be increased with concurrent use - avoid if possible.  Monitor QTc, also keep magnesium/potassium WNL if concurrent therapy can't be avoided.  Antibiotics: e.g. fluoroquinolones, erythromycin.  Antiarrhythmics: e.g. quinidine, procainamide, disopyramide, sotalol.  Antipsychotics: e.g. phenothiazines, haloperidol.   Lithium, tricyclic antidepressants, and methadone.   Thank You,  MSuzzanne Cloud, PharmD, BCPS 05/16/2022 7:15 PM

## 2022-05-17 DIAGNOSIS — R401 Stupor: Secondary | ICD-10-CM | POA: Diagnosis not present

## 2022-05-17 DIAGNOSIS — E43 Unspecified severe protein-calorie malnutrition: Secondary | ICD-10-CM | POA: Diagnosis present

## 2022-05-17 LAB — CBC
HCT: 30.5 % — ABNORMAL LOW (ref 39.0–52.0)
Hemoglobin: 9.2 g/dL — ABNORMAL LOW (ref 13.0–17.0)
MCH: 24.1 pg — ABNORMAL LOW (ref 26.0–34.0)
MCHC: 30.2 g/dL (ref 30.0–36.0)
MCV: 80.1 fL (ref 80.0–100.0)
Platelets: 174 10*3/uL (ref 150–400)
RBC: 3.81 MIL/uL — ABNORMAL LOW (ref 4.22–5.81)
RDW: 18.6 % — ABNORMAL HIGH (ref 11.5–15.5)
WBC: 11.7 10*3/uL — ABNORMAL HIGH (ref 4.0–10.5)
nRBC: 0 % (ref 0.0–0.2)

## 2022-05-17 LAB — GLUCOSE, CAPILLARY
Glucose-Capillary: 111 mg/dL — ABNORMAL HIGH (ref 70–99)
Glucose-Capillary: 111 mg/dL — ABNORMAL HIGH (ref 70–99)
Glucose-Capillary: 117 mg/dL — ABNORMAL HIGH (ref 70–99)
Glucose-Capillary: 126 mg/dL — ABNORMAL HIGH (ref 70–99)
Glucose-Capillary: 105 mg/dL — ABNORMAL HIGH (ref 70–99)
Glucose-Capillary: 86 mg/dL (ref 70–99)

## 2022-05-17 LAB — BASIC METABOLIC PANEL
Anion gap: 4 — ABNORMAL LOW (ref 5–15)
BUN: 24 mg/dL — ABNORMAL HIGH (ref 8–23)
CO2: 24 mmol/L (ref 22–32)
Calcium: 8.2 mg/dL — ABNORMAL LOW (ref 8.9–10.3)
Chloride: 113 mmol/L — ABNORMAL HIGH (ref 98–111)
Creatinine, Ser: 0.66 mg/dL (ref 0.61–1.24)
GFR, Estimated: >60 mL/min (ref >60)
Glucose, Bld: 134 mg/dL — ABNORMAL HIGH (ref 70–99)
Potassium: 3.5 mmol/L (ref 3.5–5.1)
Sodium: 141 mmol/L (ref 135–145)

## 2022-05-17 LAB — PHOSPHORUS: Phosphorus: 2.1 mg/dL — ABNORMAL LOW (ref 2.5–4.6)

## 2022-05-17 LAB — MAGNESIUM: Magnesium: 2.2 mg/dL (ref 1.7–2.4)

## 2022-05-17 MED ORDER — POTASSIUM CHLORIDE 20 MEQ PO PACK
40.0000 meq | PACK | Freq: Once | ORAL | Status: AC
  Administered 2022-05-17: 40 meq
  Filled 2022-05-17: qty 2

## 2022-05-17 MED ORDER — LIDOCAINE 5 % EX PTCH
1.0000 | MEDICATED_PATCH | CUTANEOUS | Status: DC
  Administered 2022-05-17 – 2022-06-19 (×34): 1 via TRANSDERMAL
  Filled 2022-05-17 (×38): qty 1

## 2022-05-17 MED ORDER — FAMOTIDINE 20 MG PO TABS
20.0000 mg | ORAL_TABLET | Freq: Two times a day (BID) | ORAL | Status: DC
  Administered 2022-05-17: 20 mg
  Filled 2022-05-17: qty 1

## 2022-05-17 MED ORDER — ORAL CARE MOUTH RINSE: 15.0000 mL | OROMUCOSAL | Status: DC | PRN

## 2022-05-17 MED ORDER — MORPHINE SULFATE (PF) 2 MG/ML IV SOLN
1.0000 mg | Freq: Once | INTRAVENOUS | Status: AC | PRN
  Administered 2022-05-18: 1 mg via INTRAVENOUS
  Filled 2022-05-17: qty 1

## 2022-05-17 MED ORDER — ORAL CARE MOUTH RINSE: 15.0000 mL | OROMUCOSAL | Status: DC

## 2022-05-17 MED ORDER — SODIUM PHOSPHATES 45 MMOLE/15ML IV SOLN
15.0000 mmol | Freq: Once | INTRAVENOUS | Status: AC
  Administered 2022-05-17: 15 mmol via INTRAVENOUS
  Filled 2022-05-17: qty 5

## 2022-05-17 MED ORDER — FAMOTIDINE IN NACL 20-0.9 MG/50ML-% IV SOLN
20.0000 mg | INTRAVENOUS | Status: DC
  Administered 2022-05-18: 20 mg via INTRAVENOUS
  Filled 2022-05-17: qty 50

## 2022-05-17 MED ORDER — TRAVASOL 10 % IV SOLN
INTRAVENOUS | Status: AC
  Filled 2022-05-17: qty 1224

## 2022-05-17 NOTE — Progress Notes (Signed)
PHARMACY - TOTAL PARENTERAL NUTRITION CONSULT NOTE   Indication: Crohn's disease and complex past surgical history including ex lap with small bowel resection, lysis of adhesion, mesh explantation, and diverting loop ileostomy in April 2023, on TPN prior to admission  Patient Measurements: Height: 5' 10"  Height: 5' 10"  (177.8 cm) Weight: 64.7 kg (142 lb 10.2 oz) IBW/kg (Calculated) : 73   Body mass index is 20.47 kg/m.  Assessment: Patient with PMH of Crohn's disease and complex surgical history as above on chronic TPN who presented on 05/13/22 with altered mental status after spouse found patient unresponsive with multiple pill bottles open around him. Patient was transported from SNF to ED the day prior for generalized weakness and acute worsening of his chronic abdominal pain. Patient decided to return home instead of his SNF. Pharmacy consulted to resume TPN as inpatient.   Discharge from [facility] 04/19/22:  TPN pharmacist at [facility]:  Estimated Nutrient Requirements: Protein: 1.5 - 1.7 g/kg/day (98 - 104 g/day), Total kcal: 30 - 35 kcal/kg/day (1830 - 1983 kcal/day)  TPN Formulation at [facility] per TPN pharmacist: [A 6.6%, D 20%, F 3.1%] @ goal rate, 65 mL/hr (TV: 1560 mL/day) provides: Protein 1.7 g/kg/day 103 g/day, Total kcal 32 kcal/kg/day 1971 kcal/day  NPC: N ratio 94   Admitted at [facility] 04/29/22-05/03/22: Nutritional Goals: TPN 80 mL/hr (provides 113 g of protein and 1943 kcals per day) RD Assessment: (10/16): 2100-2450kcal (30-35kcal/kg), Protein 105-130g (1.5-1.8g/kg) Utilize intralipid rather than SMOF as 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  [facility]: [name], PharmD, ph: [phone] 10/28 Most recent formula: Total volume 1560 mL:  Dextrose 220 g/day, Plenamine 120 g/day, SMOFlipid 50 g/day, NaCl 75 mEq, CaGluc 6 mEq, Potassium Ac 20 mEq, Potassium Phos 13.5 nM, Mag Sulfate 8 mEq, Vitamins/trace  elements  Glucose / Insulin: No hx DM -CBGs < 180 -Received 1u SSI past 24 hr Electrolytes: phos low Renal: SCr, BUN WNL Hepatic: LFTs, T.bili WNL. TG WNL. Albumin low at 3.2.  I/O: adequate UOP, ileostomy output.  GI Imaging: - 10/27 CTa/p: no significant interval change since 10/14 CT. No evidence of bowel obstruction or other complicating feature at the anastomotic site or ostomy. - 10/14 CTa/p: previous surgical changes noted (transverse-sigmoid anastomosis) with stable inflammation since 10/12 CT GI Surgeries / Procedures: n/a  Central access: prior to admission - double lumen CVC TPN start date: started PTA  Nutritional Goals: TPN goal rate is 85 mL/hr and provides 122 g of protein and 2072 kcals per day.  RD Assessment this admission: 10/30  Estimated Needs Total Energy Estimated Needs: 2015-2350kcal (30-35kcal/kg) Total Protein Estimated Needs: 100-135 (1.5-2.0g/kg) Total Fluid Estimated Needs: 1700-2111m  Current Nutrition:  NPO  Plan:  KCl 40 mEq given per tube this morning Now - Na phos 15 mmol IV x 1 Continue TPN at 85 mL/hr Electrolytes in TPN: Na 45 mEq/L, K 45 mEq/L (increased), Ca 5 mEq/L, Mg 5 mEq/L, and Phos 20 mmol/L (increased). Cl:Ac 1:2 Add standard MVI and trace elements to TPN Continue Sensitive q8h SSI and adjust as needed - may consider d/c if CBGs remain < 180 Monitor TPN labs on Mon/Thurs at minimum   ATawnya Crook PharmD, BCPS Clinical Pharmacist 05/17/2022 8:08 AM

## 2022-05-17 NOTE — Progress Notes (Signed)
Assesed pt at hand-off and determined that the pt is alert and oriented x4 at time of assessment.  Pts son at bedside.  Upon preparing to leave, the son approached me in the hallway and informed me that his father is now endorsing that this was a suicide attempt.  He relayed that his father said "I just couldn't take it anymore" and that he took lorazepam.  This RN spoke to the pt to confirm this discussion and the pt did reluctantly confirm that he had attempted to harm himself by drug overdose.  Pt reports that he does not currently have a desire to hurt himself or others and that he does not have any plans to hurt himself or others.  He is very tearful upon discussion and repeats that he "felt like I couldn't take it anymore after my surgery".  [facility] RN notified and communication with PCCM MD Genevive Bi was confirmed.  Suicide precaution orders being placed and [facility] called to request suicide sitter ASAP.

## 2022-05-17 NOTE — Progress Notes (Signed)
[place] Progress Note Patient Name: [name] DOB: 09-Feb-1951 MRN: [id number]   Date of Service  05/17/2022  HPI/Events of Note  Notified that patient admitted to suicide attempt although no current active suicidal ideation  eICU Interventions  Ordered safety precaution, suicide watch with sitter     Intervention Category Intermediate Interventions: Other:  Judd Lien 05/17/2022, 8:17 PM

## 2022-05-17 NOTE — Progress Notes (Signed)
Suicide precaution orders in place.  Suicide Sitter at bedside.  CSSRS Screening completed and documented.

## 2022-05-17 NOTE — Progress Notes (Signed)
[place] Progress Note Patient Name: [name] DOB: 01-Jun-1951 MRN: [id number]   Date of Service  05/17/2022  HPI/Events of Note  Having 8/10 abd pain. Asking for PRN pain meds via IV. He is NPO post extubation  Also can we change pepcid to IV  Seen asleep at this time  eICU Interventions  Ordered a one time dose of morphine prn Switched Pepcid to IV while NPO     Intervention Category Minor Interventions: Routine modifications to care plan (e.g. PRN medications for pain, fever)  Shona Needles Mylin Gignac 05/17/2022, 11:59 PM

## 2022-05-17 NOTE — Progress Notes (Signed)
[place] Progress Note Patient Name: CREG GILMER DOB: May 29, 1951 MRN: [id number]   Date of Service  05/17/2022  HPI/Events of Note  Hypokalemia - K+ = 3.5 and Creatinine = 0.66.   eICU Interventions  Will replace K+.     Intervention Category Major Interventions: Electrolyte abnormality - evaluation and management  Kallum Jorgensen Eugene 05/17/2022, 5:05 AM

## 2022-05-17 NOTE — Progress Notes (Signed)
NAME:  [name], MRN:  [id number], DOB:  Jan 12, 1951, LOS: 4 ADMISSION DATE:  05/13/2022, CONSULTATION DATE:  10/30 REFERRING MD:  Dr. Thereasa Solo, CHIEF COMPLAINT:  obtundation   History of Present Illness:  71 year old male with complicated past medical history as outlined below, which is significant for Crohn's disease with persistent abdominal pain.  On April of this year he underwent exploratory laparotomy with small bowel resection, repair of enterotomy, and creation of a diverting loop ileostomy at [facility].  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  He has been residing in [facility] from where he presented to the emergency department on 10/27 for pain at the site of his ostomy with foul-smelling discharge.  Work-up of the ostomy and abdomen including CT imaging was unremarkable.  He was discharged to SNF, however, rather than returning to SNF he found private transport to his home.  Then on 10/28 he again presented to [facility] long emergency department with chief complaint of unresponsiveness.  His wife is spoken to him earlier that morning, went downstairs to make him breakfast, and upon returning to his side he was unresponsive with open pill bottles nearby of which names and quantities were not known.  He did not respond to Narcan and ABG was unremarkable.  CT of the head was unremarkable.  He was admitted to the hospitalist service for ongoing care.  10/30 he remained unresponsive but developed tachypnea and paradoxical respirations prompting transfer to the ICU and consideration of intubation.  PCCM was consulted  Pertinent  Medical History   has a past medical history of Acute prostatitis (07/24/2007), Allergy, Arthritis, Asthma, Blood transfusion without reported diagnosis, BPH (benign prostatic hypertrophy) with urinary obstruction, Crohn's ileitis ([place]) suspected (05/03/2017), Dilated aortic root ([place]), Diverticulitis of colon, EPIDIDYMITIS (02/15/2010), GERD (gastroesophageal  reflux disease), H/O: GI bleed, Hemorrhoids, Hepatitis (1975), HERPES SIMPLEX INFECTION (10/14/2007), Hiatal hernia, Ileus following gastrointestinal surgery ([place]) (12/26/2011), Long term (current) use of systemic steroids (06/18/2018), Psoriasis, Recurrent ventral incisional hernia (05/10/2012), SVT (supraventricular tachycardia), and Ulcer (08/21/2013).   Significant Hospital Events: Including procedures, antibiotic start and stop dates in addition to other pertinent events   10/28 admit obtunded found down 10/30 tx to ICU for intubation 10/31 waking up and following commands overnight.  11/1 SBT. Hopefully extubate   Interim History / Subjective:  Afib yesterday, now sinus  K replaced overnight   Changed to Sbt this morning and doing well  Objective   Blood pressure (!) 139/59, pulse 75, temperature 99 F (37.2 C), temperature source Axillary, resp. rate 16, height 5' 10"  (1.778 m), weight 64.7 kg, SpO2 100 %.    Vent Mode: PRVC FiO2 (%):  [30 %-40 %] 30 % Set Rate:  [16 bmp] 16 bmp Vt Set:  [580 mL] 580 mL PEEP:  [5 cmH20] 5 cmH20 Pressure Support:  [5 cmH20] 5 cmH20 Plateau Pressure:  [11 cmH20-15 cmH20] 13 cmH20   Intake/Output Summary (Last 24 hours) at 05/17/2022 0756 Last data filed at 05/17/2022 6301 Gross per 24 hour  Intake 3260.05 ml  Output 2075 ml  Net 1185.05 ml   Filed Weights   05/13/22 1832 05/15/22 1400 05/15/22 1727  Weight: 67.2 kg 66.3 kg 64.7 kg    Examination: General: Chronically ill older adult M intubated NAD  HENT: NCAT pink mm ETT secure  Lungs: CTAb even unlabored on PSV  Cardiovascular: rrr s1s2 no rgm  Abdomen: soft ndnt. Stoma  Extremities: no acute joint deformity  Neuro: Awake alert following commands.  Resolved Hospital Problem list     Assessment & Plan:   Acute encephalopathy, metabolic vs toxic metabolic  -polypharm, ? OD on home meds +/- sepsis   P -holding home meds -delirium precautions  -assess ?SI when extubated    Acute respiratory failure with hypoxia likely due to aspiration - Unasyn -WUA/SBT -- hopefully extubate 11/1 -VAP, pulm hygiene   Sepsis, due to aspiration PNA -cont unasyn as above   Afib, improved P -cont amio for now  -optimize lytes  -cardiac monitoring -defer hep gtt for now unless goes back in fib    Crohn's dz  Short gut syndrome Severe protein calorie malnutrition  - Continue TPN per pharmacy - Stoma care per RN - no tube feeding  Hypokalemia  - replaced this morning + TPN   Chronic prostatitis - holding op bactrim and flowmax   Bladder calculi - To follow-up with Urology in the outpatient setting to consider transurethral calculi removal  Anemia -follow cbc    Best Practice (right click and "Reselect all SmartList Selections" daily)   Diet/type: NPO DVT prophylaxis: LMWH GI prophylaxis: PPI Lines: Central line Foley:  Yes, and it is still needed Code Status:  full code Last date of multidisciplinary goals of care discussion [10/30]  Labs   CBC: Recent Labs  Lab 05/12/22 0943 05/13/22 1212 05/14/22 0427 05/15/22 0307 05/16/22 0446 05/17/22 0419  WBC 9.2 5.5 8.0 17.7* 18.1* 11.7*  NEUTROABS 7.1  --   --   --   --   --   HGB 9.6* 9.8* 9.8* 10.2* 10.2* 9.2*  HCT 31.6* 32.6* 32.6* 33.6* 34.0* 30.5*  MCV 80.6 80.9 80.9 79.6* 80.0 80.1  PLT 160 164 180 192 180 237    Basic Metabolic Panel: Recent Labs  Lab 05/12/22 0943 05/13/22 1212 05/14/22 0427 05/15/22 0307 05/15/22 1956 05/16/22 0446 05/16/22 1845 05/17/22 0419  NA 134*   < > 140 139 140 140 139 141  K 3.9   < > 3.6 3.4* 3.0* 3.4* 3.1* 3.5  CL 107   < > 108 108 111 109 110 113*  CO2 22   < > 26 24 23 25 24 24   GLUCOSE 92   < > 106* 132* 401* 133* 127* 134*  BUN 25*   < > 15 12 16 20 21  24*  CREATININE 0.94   < > 0.88 0.96 1.01 0.85 0.78 0.66  CALCIUM 8.5*   < > 8.7* 8.6* 8.1* 8.4* 8.3* 8.2*  MG 1.8  --  2.0 1.8 1.8  --  2.4 2.2  PHOS 3.0  --  2.7 2.4*  --   --   --  2.1*    < > = values in this interval not displayed.   GFR: Estimated Creatinine Clearance: 79.8 mL/min (by C-G formula based on SCr of 0.66 mg/dL). Recent Labs  Lab 05/14/22 0427 05/15/22 0307 05/16/22 0446 05/17/22 0419  WBC 8.0 17.7* 18.1* 11.7*    Liver Function Tests: Recent Labs  Lab 05/12/22 0943 05/13/22 1212 05/14/22 0427 05/15/22 0307  AST 22 23 22 18   ALT 15 16 16 16   ALKPHOS 84 87 90 84  BILITOT 0.6 0.8 0.6 0.9  PROT 6.9 6.8 6.7 7.0  ALBUMIN 3.5 3.4* 3.2* 3.2*   Recent Labs  Lab 05/12/22 0943  LIPASE 34   Recent Labs  Lab 05/14/22 0427  AMMONIA 26    ABG    Component Value Date/Time   PHART 7.43 05/15/2022 1822   PCO2ART 38  05/15/2022 1822   PO2ART 196 (H) 05/15/2022 1822   HCO3 24.9 05/15/2022 1822   TCO2 22.5 12/21/2011 0925   ACIDBASEDEF 1.0 12/21/2011 0355   O2SAT 82.4 05/15/2022 1900     Coagulation Profile: No results for input(s): "INR", "PROTIME" in the last 168 hours.  Cardiac Enzymes: No results for input(s): "CKTOTAL", "CKMB", "CKMBINDEX", "TROPONINI" in the last 168 hours.  HbA1C: No results found for: "HGBA1C"  CBG: Recent Labs  Lab 05/16/22 1921 05/16/22 2258 05/17/22 0316 05/17/22 0600 05/17/22 0736  GLUCAP 123* 74 111* 111* 117*    CRITICAL CARE Performed by: Cristal Generous   Total critical care time: 38 minutes  Critical care time was exclusive of separately billable procedures and treating other patients.  Critical care was necessary to treat or prevent imminent or life-threatening deterioration.  Critical care was time spent personally by me on the following activities: development of treatment plan with patient and/or surrogate as well as nursing, discussions with consultants, evaluation of patient's response to treatment, examination of patient, obtaining history from patient or surrogate, ordering and performing treatments and interventions, ordering and review of laboratory studies, ordering and review of  radiographic studies, pulse oximetry and re-evaluation of patient's condition.  Eliseo Gum MSN, AGACNP-BC [place] for pager  05/17/2022, 7:56 AM

## 2022-05-17 NOTE — Procedures (Signed)
Extubation Procedure Note  Patient Details:   Name: [name] DOB: [birth year] MRN: [id number]   Airway Documentation:    Vent end date: 05/17/22 Vent end time: 1026   Evaluation  O2 sats: stable throughout Complications: No apparent complications Patient did tolerate procedure well. Bilateral Breath Sounds: Clear   Yes  Johnette Abraham 05/17/2022, 10:26 AM

## 2022-05-17 NOTE — Progress Notes (Signed)
Pt placed on wean by NP unknown time. RT extubated per MD order. No distress noted at this time.

## 2022-05-18 DIAGNOSIS — R401 Stupor: Secondary | ICD-10-CM

## 2022-05-18 LAB — GLUCOSE, CAPILLARY
Glucose-Capillary: 113 mg/dL — ABNORMAL HIGH (ref 70–99)
Glucose-Capillary: 91 mg/dL (ref 70–99)
Glucose-Capillary: 93 mg/dL (ref 70–99)
Glucose-Capillary: 99 mg/dL (ref 70–99)

## 2022-05-18 LAB — COMPREHENSIVE METABOLIC PANEL
ALT: 37 U/L (ref 0–44)
AST: 46 U/L — ABNORMAL HIGH (ref 15–41)
Albumin: 2.4 g/dL — ABNORMAL LOW (ref 3.5–5.0)
Alkaline Phosphatase: 77 U/L (ref 38–126)
Anion gap: 8 (ref 5–15)
BUN: 23 mg/dL (ref 8–23)
CO2: 23 mmol/L (ref 22–32)
Calcium: 8.1 mg/dL — ABNORMAL LOW (ref 8.9–10.3)
Chloride: 109 mmol/L (ref 98–111)
Creatinine, Ser: 0.69 mg/dL (ref 0.61–1.24)
GFR, Estimated: >60 mL/min (ref >60)
Glucose, Bld: 110 mg/dL — ABNORMAL HIGH (ref 70–99)
Potassium: 3.3 mmol/L — ABNORMAL LOW (ref 3.5–5.1)
Sodium: 140 mmol/L (ref 135–145)
Total Bilirubin: 0.5 mg/dL (ref 0.3–1.2)
Total Protein: 5.9 g/dL — ABNORMAL LOW (ref 6.5–8.1)

## 2022-05-18 LAB — CBC
HCT: 30.2 % — ABNORMAL LOW (ref 39.0–52.0)
Hemoglobin: 9.2 g/dL — ABNORMAL LOW (ref 13.0–17.0)
MCH: 24.3 pg — ABNORMAL LOW (ref 26.0–34.0)
MCHC: 30.5 g/dL (ref 30.0–36.0)
MCV: 79.7 fL — ABNORMAL LOW (ref 80.0–100.0)
Platelets: 181 10*3/uL (ref 150–400)
RBC: 3.79 MIL/uL — ABNORMAL LOW (ref 4.22–5.81)
RDW: 18.9 % — ABNORMAL HIGH (ref 11.5–15.5)
WBC: 6.9 10*3/uL (ref 4.0–10.5)
nRBC: 0 % (ref 0.0–0.2)

## 2022-05-18 LAB — BASIC METABOLIC PANEL
Anion gap: 6 (ref 5–15)
BUN: 26 mg/dL — ABNORMAL HIGH (ref 8–23)
CO2: 22 mmol/L (ref 22–32)
Calcium: 8.2 mg/dL — ABNORMAL LOW (ref 8.9–10.3)
Chloride: 112 mmol/L — ABNORMAL HIGH (ref 98–111)
Creatinine, Ser: 0.8 mg/dL (ref 0.61–1.24)
GFR, Estimated: >60 mL/min (ref >60)
Glucose, Bld: 106 mg/dL — ABNORMAL HIGH (ref 70–99)
Potassium: 3.8 mmol/L (ref 3.5–5.1)
Sodium: 140 mmol/L (ref 135–145)

## 2022-05-18 LAB — PHOSPHORUS: Phosphorus: 3.3 mg/dL (ref 2.5–4.6)

## 2022-05-18 LAB — CULTURE, RESPIRATORY W GRAM STAIN

## 2022-05-18 LAB — MAGNESIUM: Magnesium: 2 mg/dL (ref 1.7–2.4)

## 2022-05-18 LAB — TRIGLYCERIDES: Triglycerides: 70 mg/dL (ref <150)

## 2022-05-18 MED ORDER — POTASSIUM CHLORIDE 10 MEQ/100ML IV SOLN
10.0000 meq | INTRAVENOUS | Status: AC
  Administered 2022-05-18 (×4): 10 meq via INTRAVENOUS
  Filled 2022-05-18 (×4): qty 100

## 2022-05-18 MED ORDER — ORAL CARE MOUTH RINSE
15.0000 mL | OROMUCOSAL | Status: DC | PRN
  Administered 2022-06-09: 15 mL via OROMUCOSAL

## 2022-05-18 MED ORDER — TRAVASOL 10 % IV SOLN
INTRAVENOUS | Status: AC
  Filled 2022-05-18: qty 1224

## 2022-05-18 MED ORDER — ALTEPLASE 2 MG IJ SOLR
2.0000 mg | Freq: Once | INTRAMUSCULAR | Status: AC
  Administered 2022-05-18: 2 mg
  Filled 2022-05-18: qty 2

## 2022-05-18 MED ORDER — ORAL CARE MOUTH RINSE: 15.0000 mL | OROMUCOSAL | Status: DC

## 2022-05-18 MED ORDER — ORAL CARE MOUTH RINSE: 15.0000 mL | OROMUCOSAL | Status: DC | PRN

## 2022-05-18 MED ORDER — KETOROLAC TROMETHAMINE 15 MG/ML IJ SOLN
15.0000 mg | Freq: Three times a day (TID) | INTRAMUSCULAR | Status: AC | PRN
  Administered 2022-05-18 – 2022-05-21 (×6): 15 mg via INTRAVENOUS
  Filled 2022-05-18 (×6): qty 1

## 2022-05-18 NOTE — Progress Notes (Signed)
PHARMACY - TOTAL PARENTERAL NUTRITION CONSULT NOTE   Indication: Crohn's disease and complex past surgical history including ex lap with small bowel resection, lysis of adhesion, mesh explantation, and diverting loop ileostomy in April 2023, on TPN prior to admission  Patient Measurements: Height: 5' 10"  Height: 5' 10"  (177.8 cm) Weight: 64.7 kg (142 lb 10.2 oz) IBW/kg (Calculated) : 73   Body mass index is 20.47 kg/m.  Assessment: Patient with PMH of Crohn's disease and complex surgical history as above on chronic TPN who presented on 05/13/22 with altered mental status after spouse found patient unresponsive with multiple pill bottles open around him. Patient was transported from SNF to ED the day prior for generalized weakness and acute worsening of his chronic abdominal pain. Patient decided to return home instead of his SNF. Pharmacy consulted to resume TPN as inpatient.   Discharge from [facility] 04/19/22:  TPN pharmacist at [facility]:  Estimated Nutrient Requirements: Protein: 1.5 - 1.7 g/kg/day (98 - 104 g/day), Total kcal: 30 - 35 kcal/kg/day (1830 - 1983 kcal/day)  TPN Formulation at [facility] per TPN pharmacist: [A 6.6%, D 20%, F 3.1%] @ goal rate, 65 mL/hr (TV: 1560 mL/day) provides: Protein 1.7 g/kg/day 103 g/day, Total kcal 32 kcal/kg/day 1971 kcal/day  NPC: N ratio 94   Admitted at [facility] 04/29/22-05/03/22: Nutritional Goals: TPN 80 mL/hr (provides 113 g of protein and 1943 kcals per day) RD Assessment: (10/16): 2100-2450kcal (30-35kcal/kg), Protein 105-130g (1.5-1.8g/kg) Utilize intralipid rather than SMOF as 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  [facility]: [name], PharmD, ph: [phone] 10/28 Most recent formula: Total volume 1560 mL:  Dextrose 220 g/day, Plenamine 120 g/day, SMOFlipid 50 g/day, NaCl 75 mEq, CaGluc 6 mEq, Potassium Ac 20 mEq, Potassium Phos 13.5 nM, Mag Sulfate 8 mEq, Vitamins/trace  elements  Glucose / Insulin: No hx DM -CBGs < 180 -Received no SSI past 24 hr Electrolytes: K low despite repletion and increased amount in TPN yesterday; others stable WNL or improved to WNL Renal: SCr, BUN WNL; UOP borderline low or incompletely charted Hepatic: LFTs, bili, TG stable WNL; albumin remains low and decreasing I/O: ileostomy output peaked 10/30, now decreased to ~500 ml/day GI Imaging: - 10/27 CTa/p: no significant interval change since 10/14 CT. No evidence of bowel obstruction or other complicating feature at the anastomotic site or ostomy. - 10/14 CTa/p: previous surgical changes noted (transverse-sigmoid anastomosis) with stable inflammation since 10/12 CT GI Surgeries / Procedures: n/a  Central access: prior to admission - double lumen CVC TPN start date: started PTA  Nutritional Goals: TPN goal rate is 85 mL/hr and provides 122 g of protein and 2018 kcals per day.  RD Assessment this admission: 10/30  Estimated Needs Total Energy Estimated Needs: 2015-2350kcal (30-35kcal/kg) Total Protein Estimated Needs: 100-135 (1.5-2.0g/kg) Total Fluid Estimated Needs: 1700-2137m  Current Nutrition:  NPO  Plan:  KCl 10 mEq IV x 4 per MD  Continue TPN at 85 mL/hr Electrolytes in TPN: increase K Na 45 mEq/ K 55 mEq/L Ca 5 mEq/L Mg 5 mEq/L Phos 20 mmol/L Cl:Ac = 1:2 Add standard MVI and trace elements to TPN Will also add pepcid to TPN while ordered D/c SSI with well-controlled CBGs Monitor TPN labs on Mon/Thurs at minimum BMP tomorrow AM   Abdulahad Mederos, DCindie Laroche PharmD, BCPS Clinical Pharmacist 05/18/2022 11:44 AM

## 2022-05-18 NOTE — Evaluation (Addendum)
Physical Therapy Evaluation Patient Details Name: [name] MRN: [id number] DOB: 1950-09-22 Today's Date: 05/18/2022  History of Present Illness  71 year old male with complicated past medical history as outlined below, which is significant for Crohn's disease with persistent abdominal pain.  On April of this year he underwent exploratory laparotomy with small bowel resection, repair of enterotomy, and creation of a diverting loop ileostomy at [facility].  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  He has been residing in [facility] from where he presented to the emergency department on 10/27 for pain at the site of his ostomy with foul-smelling discharge.  Work-up of the ostomy and abdomen including CT imaging was unremarkable.  He was discharged to SNF, however, rather than returning to SNF he found private transport to his home.Then on 10/28 he again presented to [facility] long emergency department with chief complaint of unresponsiveness.11/2 reveals that ingestion of pills at home was in fact a suicide attempt. 1:1 sitter, psych consult  Clinical Impression  The patient was admitted for above medical problems.  Patient resting in bed, son at bedside.  Placed bed in chair position at max  upright position. Patient more alert and interactive for about 20" then became more lethargic. Left patient in more upright position . CNA in room .  Patient's voice very effortful , Patient  emotional , talking to son. Patient was ambulating short distances at SNF prior to DC home. Pt admitted with above diagnosis.   Pt currently with functional limitations due to the deficits listed below (see PT Problem List). Pt will benefit from skilled PT to increase their independence and safety with mobility to allow discharge to the venue listed below.    .        Recommendations for follow up therapy are one component of a multi-disciplinary discharge planning process, led by the attending physician.   Recommendations may be updated based on patient status, additional functional criteria and insurance authorization.  Follow Up Recommendations Long-term institutional care without follow-up therapy Can patient physically be transported by private vehicle: No    Assistance Recommended at Discharge Frequent or constant Supervision/Assistance  Patient can return home with the following  A little help with walking and/or transfers;A little help with bathing/dressing/bathroom;Assist for transportation;Help with stairs or ramp for entrance    Equipment Recommendations None recommended by PT  Recommendations for Other Services       Functional Status Assessment Patient has had a recent decline in their functional status and demonstrates the ability to make significant improvements in function in a reasonable and predictable amount of time.     Precautions / Restrictions Precautions Precautions: Fall Precaution Comments: right colostomy      Mobility  Bed Mobility               General bed mobility comments: placed bed in chair position(max upright). Assisted both UE's to grip the rails and lean forward with total  + 2 using bed  pad.    Transfers                        Ambulation/Gait                  Stairs            Wheelchair Mobility    Modified Rankin (Stroke Patients Only)       Balance Overall balance assessment: Needs assistance Sitting-balance support: Feet supported, Bilateral upper extremity supported Sitting  balance-Leahy Scale: Zero                                       Pertinent Vitals/Pain Pain Assessment Pain Assessment: Faces Faces Pain Scale: Hurts even more Pain Location: abdomen Pain Descriptors / Indicators: Discomfort, Grimacing Pain Intervention(s): Monitored during session, RN gave pain meds during session    Home Living Family/patient expects to be discharged to:: Skilled nursing facility                    Additional Comments: per son, ambulated with Rw and assistance short distances at Recent SNF    Prior Function Prior Level of Function : Needs assist             Mobility Comments: unsure if he ambulated after   last DC from hospital to home       Hand Dominance        Extremity/Trunk Assessment   Upper Extremity Assessment Upper Extremity Assessment: Generalized weakness    Lower Extremity Assessment Lower Extremity Assessment: Generalized weakness    Cervical / Trunk Assessment Cervical / Trunk Assessment: Other exceptions Cervical / Trunk Exceptions: holds head at midline, unable to sit unsupported at the trunk  Communication      Cognition Arousal/Alertness: Lethargic Behavior During Therapy: Flat affect Overall Cognitive Status: Impaired/Different from baseline Area of Impairment: Orientation, Following commands, Attention, Safety/judgement, Awareness                 Orientation Level: Time Current Attention Level: Focused   Following Commands: Follows one step commands with increased time   Awareness: Intellectual   General Comments: frequently appears to doze, verbal stimulation to arouse and engage in  therapy. Emotional several times when talking to his soon, voice very low and does not complete words, decreased effort.        General Comments      Exercises     Assessment/Plan    PT Assessment Patient needs continued PT services  PT Problem List Decreased strength;Decreased activity tolerance;Decreased balance;Decreased knowledge of use of DME;Decreased mobility;Pain       PT Treatment Interventions Therapeutic activities;Balance training;Cognitive remediation;Functional mobility training;Therapeutic exercise;Patient/family education    PT Goals (Current goals can be found in the Care Plan section)  Acute Rehab PT Goals Patient Stated Goal: to get better PT Goal Formulation: With patient/family Time For Goal  Achievement: 06/01/22 Potential to Achieve Goals: Fair    Frequency Min 2X/week     Co-evaluation               AM-PAC PT "6 Clicks" Mobility  Outcome Measure Help needed turning from your back to your side while in a flat bed without using bedrails?: Total Help needed moving from lying on your back to sitting on the side of a flat bed without using bedrails?: Total Help needed moving to and from a bed to a chair (including a wheelchair)?: Total Help needed standing up from a chair using your arms (e.g., wheelchair or bedside chair)?: Total Help needed to walk in hospital room?: Total Help needed climbing 3-5 steps with a railing? : Total 6 Click Score: 6    End of Session   Activity Tolerance: Patient tolerated treatment well;Patient limited by fatigue;Patient limited by lethargy Patient left: in bed;with call bell/phone within reach;with family/visitor present;with nursing/sitter in room Nurse Communication: Mobility status;Need for lift equipment PT Visit  Diagnosis: Difficulty in walking, not elsewhere classified (R26.2)    Time: 1555-1630 PT Time Calculation (min) (ACUTE ONLY): 35 min   Charges:   PT Evaluation $PT Eval Low Complexity: 1 Low PT Treatments $Therapeutic Activity: 8-22 mins        Tresa Endo PT Acute Rehabilitation Services Office [phone] Weekend [phone]  Claretha Cooper 05/18/2022, 5:07 PM

## 2022-05-18 NOTE — Consult Note (Signed)
Complete psychiatric evaluation deferred due to patient being hoarse from recent extubation.   71 year old male with complicated past medical history as outlined below, which is significant for Crohn's disease with persistent abdominal pain.  On April of this year he underwent exploratory laparotomy with small bowel resection, repair of enterotomy, and creation of a diverting loop ileostomy at [facility].  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    He was discharged to SNF, however, rather than returning to SNF he found private transport to his home. Then on 10/28 he again presented to [facility] long emergency department with chief complaint of unresponsiveness.  His wife is spoken to him earlier that morning, went downstairs to make him breakfast, and upon returning to his side he was unresponsive with open pill bottles nearby of which names and quantities were not known.     After recent extubation, patient did admit to staff nurse that he did in fact overdose on pills in an attempt to end his life.  On further evaluation, patient did endorse intentionally catching a [place] home, in order to say goodbye to his wife.  He reports he has been contemplating suicide for 1 month.  He reports prior to developing suicidal thoughts he had been requesting psychiatric help, however no one assisted and or attempted to get him additional services whether at [facility] or inpatient hospital.  Patient reports he has been in chronic pain "physical pain" for the past 8 months.  He states for the past year he has been in what he describes as a depressed mood, recently developing suicidal thoughts x 1 month ago.  He rates his emotional pain at a 6 out of 10 with 10 being the worst on a [place] scale.  We did pause during HPI and subjective history, due to patient needing to suction, ongoing coughing.  Writer was able to briefly obtain previous psychiatric history prior to terminating interview for patient to be able to rest  and get some sleep. Pt denies ever been hospitalized for mental health concerns in the past. Denies any previous history of suicidal thoughts prior to one month ago, suicidal ideations, suicide attempt, and or non suicidal self injurious behaviors. Pt denies history of aggression, agitation, violent behavior, and or history of homicidal ideations/thoughts.  Patient further denies any current, previous legal charges.  Patient further denies access to guns, weapons, or any engagement with the legal system.  Patient denies history of illicit substances to include synthetic substances, any cannabidiol, supplemental herbs.    Considering findings listed above will recommend inpatient psychiatric admission once medically stable. -At this time patient is willing to voluntarily be admitted to inpatient psychiatric facility.  In the event patient attempts to leave hospital , he will need to be placed under involuntary commitment. -Consent was obtained to speak with family and provide an update to his son and daughter-in-law.  Both do agree with current plan, and seem to be very supportive. -Psychiatry consult service will continue to follow at this time.  Will see patient daily, consider starting SSRI tomorrow if medically stable. -TOC consult for inpatient psychiatric gero unit once medically stable.

## 2022-05-18 NOTE — Evaluation (Addendum)
SLP Cancellation Note  Patient Details Name: [name] MRN: [id number] DOB: June 17, 1951   Cancelled treatment:       Reason Eval/Treat Not Completed: Other (comment) Shirlee Limerick Bowser with CCM advised SLP defer swallow eval at this time as pt is sleeping; will continue efforts)  Kathleen Lime, MS [facility] SLP [place] Office [phone] Pager [phone]   Macario Golds 05/18/2022, 12:23 PM

## 2022-05-18 NOTE — Progress Notes (Signed)
Spiritual Care Note  Followed up with son [name] at bedside while Mr Gauss was asleep. Met Mr Ettinger's wife, as well. Family was processing the updates of Mr Maclaughlin's extubation and beginning to rouse/engage. We discussed the importance of self-care and the ongoing work of trying to strike a dynamic balance between showing support for the patient at the bedside and ensuring that one rests, eats, and cares for one's own daily responsibilities as well.  Provided pastoral presence, empathic listening, and emotional/social support. Family appreciative of pastoral check-ins and aware of ongoing chaplain availability.   Chaplain  , MDiv, BCC [facility] 24/7 pager [phone]  

## 2022-05-18 NOTE — Consult Note (Signed)
   [facility] Inpatient Consult   05/18/2022  [name] 1951/06/16 [id number]  [facility] [ACO] Patient Medicare [facility] Liaison remote coverage for patient at [facility] for readmission review  Primary Care Provider:  [name], MD with [place] at [facility] which is listed to provide the transition of care follow up   Patient has been referred to a [place] Management for care coordination prior to admissions for outreach of services.   [facility] community care coordinator has attempted out reach for care coordination management and community resource support.   Patient's electronic medical record was reviewed for readmission less than 30 days as he was extubated 05/17/22 and now on suicide safety precautions for potential self harm noted. With sitter.  Patient is progressed to a med-surg level of care for transfer out of ICU. Patient could benefit from PT/OT evaluation as progresses to assist in determining post hospital follow up transitional needs.  Plan: Continue to follow progression and disposition needs for community follow up. Will update [facility] RN listed on care team of progress and shows [facility] RN has been unable to successfully reach in the past.  Of note, [facility] Care Management services does not replace or interfere with any services that are needed or arranged by inpatient [facility] care management team.  For additional questions or referrals please contact:  Natividad Brood, RN BSN [place]  [phone] business mobile phone Toll free office [phone]  *[place]  [phone] Fax number: [phone] [email or web address]

## 2022-05-18 NOTE — Progress Notes (Addendum)
Nutrition Follow-up  DOCUMENTATION CODES:  Severe malnutrition in context of chronic illness  INTERVENTION:  -Check and replete lytes prn, K repletion ordered for today, recommend increasing K in TPN -Monitor LFTs and cycle TPN if they continue to trend up -Consider checking ionized Ca for more accurate result -Continue TPN at goal rate of 1m/hr to provide 122.4g AA(1.89g/kg), 285.6g dextrose(GIR:3.016mkg/min), 61.2g SMOF lipids (29.5%kcal), and 2040101mluid to provide 2073kcal/day (32kcal/kg). -Advance po diet as clinically appropriate -Provide Strawberry Ensure Plus HP (350kcal, 20g protein) q day as diet is advanced.  NUTRITION DIAGNOSIS:  Severe Malnutrition related to chronic illness as evidenced by percent weight loss, severe fat depletion, severe muscle depletion.  GOAL:  Patient will meet greater than or equal to 90% of their needs  MONITOR:  Diet advancement, Labs, Other (Comment) (TPN)  REASON FOR ASSESSMENT:  Consult New TPN/TNA  ASSESSMENT:  Pt is a [age]yowith PMH of Crohn's disease resulting in a complicated surgical history followed at [facility] to include left hemicolectomy and ileostomy with multiple other complications to include TPN dependence, chronic prostatitis, SVT, and asthma who presents with generalized weakness, acute worsening of his chronic abdominal pain and AMS.  Pt extubated 11/1. Continues on TPN at higher calorie provision to prevent further weight loss. Current TPN order provides 122.4g AA(1.89g/kg), 285.6g dextrose(GIR:3.84m39m/min), 61.2g SMOF lipids (29.5%kcal), and 2040mL6mid to provide 2073kcal/day (32kcal/kg). Pt meet estimated needs with TPN. Resume po intake as clinically appropriate. Pt will accept Ensure Plus HP strawberry q day to provide an additional 350kcal and 20g protein.   Note labs: K low at 3.3-repletion ordered, recommend increasing K in TPN as well. BG maintaining <130, appropriate control. Corrected Ca:9.0 (Ca:8.1,  albumin:2.4), consider checking ionized Ca. AST:46, continue to trend and consider cycling TPN if LFTs worsen. Triglycerides WNL at 70.  Diet Order:   Diet Order             Diet NPO time specified  Diet effective now                   EDUCATION NEEDS:  Education needs have been addressed  Skin:  Skin Assessment: Reviewed RN Assessment  Last BM:  10/30-ostomy  Height:  Ht Readings from Last 1 Encounters:  05/14/22 5' 10"  (1.778 m)   Weight:  Wt Readings from Last 1 Encounters:  05/15/22 64.7 kg    BMI:  Body mass index is 20.47 kg/m.  Estimated Nutritional Needs:  Kcal:  2015-2350kcal (30-35kcal/kg) Protein:  100-135 (1.5-2.0g/kg) Fluid:  1700-2100mL 68mie Candise BowensRD, LDN, CNSC See AMiON for contact information

## 2022-05-18 NOTE — Progress Notes (Signed)
NAME:  [name], MRN:  [id number], DOB:  06-Oct-1950, LOS: 5 ADMISSION DATE:  05/13/2022, CONSULTATION DATE:  10/30 REFERRING MD:  Dr. Thereasa Solo, CHIEF COMPLAINT:  obtundation   History of Present Illness:  71 year old male with complicated past medical history as outlined below, which is significant for Crohn's disease with persistent abdominal pain.  On April of this year he underwent exploratory laparotomy with small bowel resection, repair of enterotomy, and creation of a diverting loop ileostomy at [facility].  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  He has been residing in [facility] from where he presented to the emergency department on 10/27 for pain at the site of his ostomy with foul-smelling discharge.  Work-up of the ostomy and abdomen including CT imaging was unremarkable.  He was discharged to SNF, however, rather than returning to SNF he found private transport to his home.  Then on 10/28 he again presented to [facility] long emergency department with chief complaint of unresponsiveness.  His wife is spoken to him earlier that morning, went downstairs to make him breakfast, and upon returning to his side he was unresponsive with open pill bottles nearby of which names and quantities were not known.  He did not respond to Narcan and ABG was unremarkable.  CT of the head was unremarkable.  He was admitted to the hospitalist service for ongoing care.  10/30 he remained unresponsive but developed tachypnea and paradoxical respirations prompting transfer to the ICU and consideration of intubation.  PCCM was consulted  Pertinent  Medical History   has a past medical history of Acute prostatitis (07/24/2007), Allergy, Arthritis, Asthma, Blood transfusion without reported diagnosis, BPH (benign prostatic hypertrophy) with urinary obstruction, Crohn's ileitis ([facility]) suspected (05/03/2017), Dilated aortic root ([place]), Diverticulitis of colon, EPIDIDYMITIS (02/15/2010), GERD (gastroesophageal  reflux disease), H/O: GI bleed, Hemorrhoids, Hepatitis (1975), HERPES SIMPLEX INFECTION (10/14/2007), Hiatal hernia, Ileus following gastrointestinal surgery ([place]) (12/26/2011), Long term (current) use of systemic steroids (06/18/2018), Psoriasis, Recurrent ventral incisional hernia (05/10/2012), SVT (supraventricular tachycardia), and Ulcer (08/21/2013).   Significant Hospital Events: Including procedures, antibiotic start and stop dates in addition to other pertinent events   10/28 admit obtunded found down 10/30 tx to ICU for intubation 10/31 waking up and following commands overnight.  11/1 SBT. Extubated 11/2 reveals that ingestion of pills at home was in fact a suicide attempt. 1:1 sitter, psych consult. Transfer out of ICU   Interim History / Subjective:  Extubated 11/1   Overnight revealed that ingestion of pills was a suicide attempt. Denied active SI. 1:1 sitter was ordered  Objective   Blood pressure 135/68, pulse 64, temperature (!) 97 F (36.1 C), temperature source Axillary, resp. rate (!) 25, height 5' 10"  (1.778 m), weight 64.7 kg, SpO2 99 %.        Intake/Output Summary (Last 24 hours) at 05/18/2022 0852 Last data filed at 05/18/2022 0600 Gross per 24 hour  Intake 3139.36 ml  Output 1750 ml  Net 1389.36 ml   Filed Weights   05/13/22 1832 05/15/22 1400 05/15/22 1727  Weight: 67.2 kg 66.3 kg 64.7 kg    Examination: General: Chronically ill older adult M  HENT: dry mm poor dentition  Lungs: CTAb even unlabored  Cardiovascular: rrr s1s2 2+ radial pulses  Abdomen: soft ndnt  Extremities: no acute joint deformity  Neuro: Awake lethargic, following commands   Resolved Hospital Problem list     Assessment & Plan:   Intentional overdose Depression Toxic metabolic encephalopathy due to intentional overdose -  suicide attempt w home BZDs  -denies ongoing SI P -psych consult -1:1 -hold home meds  -delirium precautions   Aspiration PNA Possible dysphagia -  Unasyn -SLP consult  -NPO   Crohn's dz  Short gut syndrome Severe protein calorie malnutrition   Crohn's disease Short gut syndrome Severe protein calorie malnutrition Chronic pain  - Cont TPN  Afib, improved -brief, converted with amio  P -dc amio  -defer AC for now  Hypokalemia - replaced this morning + TPN   Chronic prostatitis - after course of unasyn for aspiration PNA complete, restart bactrim (tmp/smx to end 11/12) -holding home PO meds pending SLP consult   Bladder calculi - To follow-up with Urology in the outpatient setting to consider transurethral calculi removal  Anemia, stable  -PRN CBC    Best Practice (right click and "Reselect all SmartList Selections" daily)   Diet/type: NPO -- TPN DVT prophylaxis: LMWH GI prophylaxis: PPI Lines: Central line Foley:  Yes, and it is still needed Code Status:  full code Last date of multidisciplinary goals of care discussion [11/1]  Labs   CBC: Recent Labs  Lab 05/12/22 0943 05/13/22 1212 05/14/22 0427 05/15/22 0307 05/16/22 0446 05/17/22 0419 05/18/22 0541  WBC 9.2   < > 8.0 17.7* 18.1* 11.7* 6.9  NEUTROABS 7.1  --   --   --   --   --   --   HGB 9.6*   < > 9.8* 10.2* 10.2* 9.2* 9.2*  HCT 31.6*   < > 32.6* 33.6* 34.0* 30.5* 30.2*  MCV 80.6   < > 80.9 79.6* 80.0 80.1 79.7*  PLT 160   < > 180 192 180 174 181   < > = values in this interval not displayed.    Basic Metabolic Panel: Recent Labs  Lab 05/12/22 0943 05/13/22 1212 05/14/22 0427 05/15/22 0307 05/15/22 1956 05/16/22 0446 05/16/22 1845 05/17/22 0419 05/18/22 0541  NA 134*   < > 140 139 140 140 139 141 140  K 3.9   < > 3.6 3.4* 3.0* 3.4* 3.1* 3.5 3.3*  CL 107   < > 108 108 111 109 110 113* 109  CO2 22   < > 26 24 23 25 24 24 23   GLUCOSE 92   < > 106* 132* 401* 133* 127* 134* 110*  BUN 25*   < > 15 12 16 20 21  24* 23  CREATININE 0.94   < > 0.88 0.96 1.01 0.85 0.78 0.66 0.69  CALCIUM 8.5*   < > 8.7* 8.6* 8.1* 8.4* 8.3* 8.2* 8.1*  MG  1.8  --  2.0 1.8 1.8  --  2.4 2.2 2.0  PHOS 3.0  --  2.7 2.4*  --   --   --  2.1* 3.3   < > = values in this interval not displayed.   GFR: Estimated Creatinine Clearance: 79.8 mL/min (by C-G formula based on SCr of 0.69 mg/dL). Recent Labs  Lab 05/15/22 0307 05/16/22 0446 05/17/22 0419 05/18/22 0541  WBC 17.7* 18.1* 11.7* 6.9    Liver Function Tests: Recent Labs  Lab 05/12/22 0943 05/13/22 1212 05/14/22 0427 05/15/22 0307 05/18/22 0541  AST 22 23 22 18  46*  ALT 15 16 16 16  37  ALKPHOS 84 87 90 84 77  BILITOT 0.6 0.8 0.6 0.9 0.5  PROT 6.9 6.8 6.7 7.0 5.9*  ALBUMIN 3.5 3.4* 3.2* 3.2* 2.4*   Recent Labs  Lab 05/12/22 0943  LIPASE 34   Recent Labs  Lab  05/14/22 0427  AMMONIA 26    ABG    Component Value Date/Time   PHART 7.43 05/15/2022 1822   PCO2ART 38 05/15/2022 1822   PO2ART 196 (H) 05/15/2022 1822   HCO3 24.9 05/15/2022 1822   TCO2 22.5 12/21/2011 0925   ACIDBASEDEF 1.0 12/21/2011 0355   O2SAT 82.4 05/15/2022 1900     Coagulation Profile: No results for input(s): "INR", "PROTIME" in the last 168 hours.  Cardiac Enzymes: No results for input(s): "CKTOTAL", "CKMB", "CKMBINDEX", "TROPONINI" in the last 168 hours.  HbA1C: No results found for: "HGBA1C"  CBG: Recent Labs  Lab 05/17/22 1152 05/17/22 1717 05/17/22 2039 05/18/22 0016 05/18/22 0519  GLUCAP 126* 105* 86 113* 99    CCT n/a  Eliseo Gum MSN, AGACNP-BC [place] for pager  05/18/2022, 8:52 AM

## 2022-05-18 NOTE — Progress Notes (Signed)
[place] Physician-Brief Progress Note Patient Name: [name] DOB: 1950/08/05 MRN: [id number]   Date of Service  05/18/2022  HPI/Events of Note  Notified of K 3.3 Creatinine 0.69 On TPN  eICU Interventions  Potassium 10 meqs x 4 doses ordered     Intervention Category Intermediate Interventions: Electrolyte abnormality - evaluation and management  Judd Lien 05/18/2022, 6:46 AM

## 2022-05-19 ENCOUNTER — Encounter (HOSPITAL_COMMUNITY): Payer: Self-pay | Admitting: Internal Medicine

## 2022-05-19 DIAGNOSIS — K50918 Crohn's disease, unspecified, with other complication: Secondary | ICD-10-CM

## 2022-05-19 DIAGNOSIS — T1491XA Suicide attempt, initial encounter: Secondary | ICD-10-CM

## 2022-05-19 DIAGNOSIS — R401 Stupor: Secondary | ICD-10-CM | POA: Diagnosis not present

## 2022-05-19 DIAGNOSIS — D649 Anemia, unspecified: Secondary | ICD-10-CM | POA: Diagnosis not present

## 2022-05-19 LAB — GLUCOSE, CAPILLARY
Glucose-Capillary: 105 mg/dL — ABNORMAL HIGH (ref 70–99)
Glucose-Capillary: 105 mg/dL — ABNORMAL HIGH (ref 70–99)
Glucose-Capillary: 98 mg/dL (ref 70–99)

## 2022-05-19 MED ORDER — DIPHENHYDRAMINE HCL 50 MG/ML IJ SOLN
12.5000 mg | Freq: Every evening | INTRAMUSCULAR | Status: DC | PRN
  Administered 2022-05-21: 12.5 mg via INTRAVENOUS
  Filled 2022-05-19: qty 1

## 2022-05-19 MED ORDER — TRAVASOL 10 % IV SOLN
INTRAVENOUS | Status: AC
  Filled 2022-05-19: qty 1224

## 2022-05-19 NOTE — Consult Note (Signed)
[place] Psychiatry Consult   Reason for Consult:  Suicide attempt Referring Physician:  Dr. Wyline Copas Patient Identification: [name] MRN:  [id number] Principal Diagnosis: Obtundation Diagnosis:  Principal Problem:   Obtundation Active Problems:   Metabolic encephalopathy   Chronic anemia   Acute respiratory failure with hypoxia (HCC)   Severe malnutrition ([place])   Total Time spent with patient: 45 minutes  Subjective:   [name] is a 71 y.o. male patient admitted after suicide attempt by overdose on pills.  Patient does admit to worsening depression secondary to chronic pain, multiple hospital admissions, and anxiety.  He reports that he has been contemplating and this particular suicide attempt for the past month.  He has reached out to several persons and facilities to request psychiatric help, with no assistance.  Patient remains open to inpatient psychiatric care at this time.  Once patient is medically stable, labs have improved will consider starting SSRI.   [name] is a 71 y.o.  male with past psychiatric history of major depressive disorder recently admitted for suicide attempt secondary to pain. Patient is able to vocalize his pain is a major contributing factor to his worsening suicidal ideations and worsening depressive symptoms at this time.  Although the patient does present with slow speech, depressed mood and affect he denies active suicidal ideations.  It appears patient's primary goal is to no longer suffer, receiving adequate treatment for pain management, and seek psychiatric help for his underlying depression.  Will keep suicide precautions in place, will recommend inpatient psychiatric admission to a geriatric facility once medically stable.  Patient is TPN dependent, and this may be a barrier to possible admission.  Will plan to add SSRI to target depressive symptoms and suicidality, once medically stable.  Spoke with patient's wife [name], who is  frustrated with lack of communication as she is the next of kin.  She is very upset that no one has reached out to her, 10 of 12 minutes phone call was geared towards her feelings.  She is advised psychiatry was recently consulted yesterday, and son has been present at bedside both visits from psychiatry.  Did discuss patient's suicide attempt, in which she did not appear to be receptive to " he has not told me that himself.  I know he loves me.  All he has told me is that he is sorry."  She is also advised current treatment plan is for inpatient psychiatric admission once medically stable.  Will also consult palliative medicine team for Improve his quality of life, help with symptom management, pain management, and establishing goals of care.  HPI:71 year old male with complicated past medical history as outlined below, which is significant for Crohn's disease with persistent abdominal pain.  On April of this year he underwent exploratory laparotomy with small bowel resection, repair of enterotomy, and creation of a diverting loop ileostomy at [facility].  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    He was discharged to SNF, however, rather than returning to SNF he found private transport to his home. Then on 10/28 he again presented to [facility] long emergency department with chief complaint of unresponsiveness.  His wife is spoken to him earlier that morning, went downstairs to make him breakfast, and upon returning to his side he was unresponsive with open pill bottles nearby of which names and quantities were not known.        Past Psychiatric History: Depression  Risk to Self:   yes suicide  attempt Risk to Others:   Denies Prior Inpatient Therapy:   Denies Prior Outpatient Therapy:   Denies  Past Medical History:  Past Medical History:  Diagnosis Date   Acute prostatitis 07/24/2007   Qualifier: Diagnosis of  By: [name] MD, [name]    Allergy    mild   Arthritis    osteoarthritis   Asthma     Blood transfusion without reported diagnosis    BPH (benign prostatic hypertrophy) with urinary obstruction    Crohn's ileitis ([place]) suspected 05/03/2017   Dilated aortic root ([place])    noted on echo 08/2012   Diverticulitis of colon    EPIDIDYMITIS 02/15/2010   Qualifier: Diagnosis of  By: [name] MD, [name]    GERD (gastroesophageal reflux disease)    H/O: GI bleed    Hemorrhoids    Hepatitis 1975   unknown type    HERPES SIMPLEX INFECTION 10/14/2007   Qualifier: Diagnosis of  By: [name] MD, [name]    Hiatal hernia    Ileus following gastrointestinal surgery ([place]) 12/26/2011   Long term (current) use of systemic steroids 06/18/2018   Psoriasis    sees [name] at [facility].   Recurrent ventral incisional hernia 05/10/2012   SVT (supraventricular tachycardia)    Ulcer 08/21/2013   ileal    Past Surgical History:  Procedure Laterality Date   BOWEL RESECTION  12/19/2011   Procedure: SMALL BOWEL RESECTION;  Surgeon: [name], MD;  Location: [facility];  Service: General;  Laterality: N/A;  with anastamosis and insertion mesh   BRONCHOSCOPY     COLON SURGERY  01/2004   x 2   COLONOSCOPY W/ BIOPSIES  04/26/2017   per [name], no polyps, benign inflammation, repeat in 5 yrs    CYSTOSCOPY     ESOPHAGOGASTRODUODENOSCOPY     HEMICOLECTOMY     left side, at [facility], diverticulitis   [place]     [phone] incisional hernia   ILEOSTOMY     ILEOSTOMY CLOSURE     INSERTION OF MESH  07/31/2012   Procedure: INSERTION OF MESH;  Surgeon: [name], MD;  Location: [facility];  Service: General;;   LAPAROTOMY  12/19/2011   Procedure: EXPLORATORY LAPAROTOMY;  Surgeon: [name], MD;  Location: [facility];  Service: General;  Laterality: N/A;   PACEMAKER IMPLANT N/A 12/01/2020   Procedure: PACEMAKER IMPLANT;  Surgeon: [name], MD;  Location: [place] [facility];  Service: Cardiovascular;  Laterality: N/A;   PACEMAKER INSERTION  Left    TONSILLECTOMY     UPPER GASTROINTESTINAL ENDOSCOPY     VASECTOMY     VENTRAL HERNIA REPAIR  07/31/2012   Procedure: HERNIA REPAIR VENTRAL ADULT;  Surgeon: [name], MD;  Location: [facility];  Service: General;  Laterality: N/A;   Family History:  Family History  Problem Relation Age of Onset   Lung cancer Mother    Leukemia Father    Hypertension Father    Heart disease Father    Heart attack Father    Prostate cancer Father    Prostate cancer Paternal Uncle    Colon cancer Neg Hx    Stomach cancer Neg Hx    Colon polyps Neg Hx    Esophageal cancer Neg Hx    Rectal cancer Neg Hx    Family Psychiatric  History: Denies Social History:  Social History   Substance and Sexual Activity  Alcohol  Use Not Currently   Comment: occ     Social History   Substance and Sexual Activity  Drug Use Yes   Types: Oxycodone    Social History   Socioeconomic History   Marital status: Married    Spouse name: Not on file   Number of children: 2   Years of education: Not on file   Highest education level: Not on file  Occupational History   Occupation: EHS manager    Employer: [employer] education [employer]  Tobacco Use   Smoking status: Never   Smokeless tobacco: Never  Vaping Use   Vaping Use: Never used  Substance and Sexual Activity   Alcohol use: Not Currently    Comment: occ   Drug use: Yes    Types: Oxycodone   Sexual activity: Not on file  Other Topics Concern   Not on file  Social History Narrative   He is married with 2 sons 1 son is an Art gallery manager and 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   He is Nurse, 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   Rare if any caffeine   Rare alcohol and never smoker   Social Determinants of Radio broadcast assistant Strain: Low Risk  (04/10/2022)   Overall Financial Resource Strain (CARDIA)    Difficulty of Paying  Living Expenses: Not hard at all  Food Insecurity: No Food Insecurity (05/14/2022)   Hunger Vital Sign    Worried About Running Out of Food in the Last Year: Never true    [place] in the Last Year: Never true  Transportation Needs: No Transportation Needs (05/14/2022)   PRAPARE - Hydrologist (Medical): No    Lack of Transportation (Non-Medical): No  Physical Activity: Inactive (04/10/2022)   Exercise Vital Sign    Days of Exercise per Week: 0 days    Minutes of Exercise per Session: 0 min  Stress: Stress Concern Present (04/10/2022)   [place]    Feeling of Stress : To some extent  Social Connections: Not on file   Additional Social History:    Allergies:   Allergies  Allergen Reactions   Purinethol [Mercaptopurine] Other (See Comments)    Pancreatitis    Shellfish Allergy Anaphylaxis and Swelling    Can use standard SMOF lipid formulation for TPN without any issue.    Humira [Adalimumab] Other (See Comments)    Developed antibodies   Tape Rash   Wound Dressing Adhesive Rash    Labs:  Results for orders placed or performed during the hospital encounter of 05/13/22 (from the past 48 hour(s))  Glucose, capillary     Status: None   Collection Time: 05/17/22  8:39 PM  Result Value Ref Range   Glucose-Capillary 86 70 - 99 mg/dL    Comment: Glucose reference range applies only to samples taken after fasting for at least 8 hours.  Glucose, capillary     Status: Abnormal   Collection Time: 05/18/22 12:16 AM  Result Value Ref Range   Glucose-Capillary 113 (H) 70 - 99 mg/dL    Comment: Glucose reference range applies only to samples taken after fasting for at least 8 hours.   Comment 1 Notify RN    Comment 2 Document in Chart   Glucose, capillary     Status: None  Collection Time: 05/18/22  5:19 AM  Result Value Ref Range   Glucose-Capillary 99 70 - 99 mg/dL    Comment:  Glucose reference range applies only to samples taken after fasting for at least 8 hours.   Comment 1 Notify RN    Comment 2 Document in Chart   Comprehensive metabolic panel     Status: Abnormal   Collection Time: 05/18/22  5:41 AM  Result Value Ref Range   Sodium 140 135 - 145 mmol/L   Potassium 3.3 (L) 3.5 - 5.1 mmol/L   Chloride 109 98 - 111 mmol/L   CO2 23 22 - 32 mmol/L   Glucose, Bld 110 (H) 70 - 99 mg/dL    Comment: Glucose reference range applies only to samples taken after fasting for at least 8 hours.   BUN 23 8 - 23 mg/dL   Creatinine, Ser 0.69 0.61 - 1.24 mg/dL   Calcium 8.1 (L) 8.9 - 10.3 mg/dL   Total Protein 5.9 (L) 6.5 - 8.1 g/dL   Albumin 2.4 (L) 3.5 - 5.0 g/dL   AST 46 (H) 15 - 41 U/L   ALT 37 0 - 44 U/L   Alkaline Phosphatase 77 38 - 126 U/L   Total Bilirubin 0.5 0.3 - 1.2 mg/dL   GFR, Estimated >60 >60 mL/min    Comment: (NOTE) Calculated using the CKD-EPI Creatinine Equation (2021)    Anion gap 8 5 - 15    Comment: Performed at [facility], [place] [address], [place], [place] [address]  Magnesium     Status: None   Collection Time: 05/18/22  5:41 AM  Result Value Ref Range   Magnesium 2.0 1.7 - 2.4 mg/dL    Comment: Performed at [facility], [place] [address], [place], [place] [address]  Phosphorus     Status: None   Collection Time: 05/18/22  5:41 AM  Result Value Ref Range   Phosphorus 3.3 2.5 - 4.6 mg/dL    Comment: Performed at [facility], [place] [address], [place], [place] [address]  Triglycerides     Status: None   Collection Time: 05/18/22  5:41 AM  Result Value Ref Range   Triglycerides 70 <150 mg/dL    Comment: Performed at [facility], [place] [address], [place], [place] [address]  CBC     Status: Abnormal   Collection Time: 05/18/22  5:41 AM  Result Value Ref Range   WBC 6.9 4.0 - 10.5 K/uL   RBC 3.79 (L) 4.22 - 5.81 MIL/uL   Hemoglobin 9.2 (L) 13.0 - 17.0 g/dL   HCT  30.2 (L) 39.0 - 52.0 %   MCV 79.7 (L) 80.0 - 100.0 fL   MCH 24.3 (L) 26.0 - 34.0 pg   MCHC 30.5 30.0 - 36.0 g/dL   RDW 18.9 (H) 11.5 - 15.5 %   Platelets 181 150 - 400 K/uL   nRBC 0.0 0.0 - 0.2 %    Comment: Performed at [facility], [place] [address], [place], [place] [address]  Glucose, capillary     Status: None   Collection Time: 05/18/22  4:29 PM  Result Value Ref Range   Glucose-Capillary 91 70 - 99 mg/dL    Comment: Glucose reference range applies only to samples taken after fasting for at least 8 hours.  Glucose, capillary     Status: None   Collection Time: 05/18/22  8:16 PM  Result Value Ref Range   Glucose-Capillary 93  70 - 99 mg/dL    Comment: Glucose reference range applies only to samples taken after fasting for at least 8 hours.   Comment 1 Notify RN   Basic metabolic panel     Status: Abnormal   Collection Time: 05/18/22 10:02 PM  Result Value Ref Range   Sodium 140 135 - 145 mmol/L   Potassium 3.8 3.5 - 5.1 mmol/L   Chloride 112 (H) 98 - 111 mmol/L   CO2 22 22 - 32 mmol/L   Glucose, Bld 106 (H) 70 - 99 mg/dL    Comment: Glucose reference range applies only to samples taken after fasting for at least 8 hours.   BUN 26 (H) 8 - 23 mg/dL   Creatinine, Ser 0.80 0.61 - 1.24 mg/dL   Calcium 8.2 (L) 8.9 - 10.3 mg/dL   GFR, Estimated >60 >60 mL/min    Comment: (NOTE) Calculated using the CKD-EPI Creatinine Equation (2021)    Anion gap 6 5 - 15    Comment: Performed at [facility], [place] [address] [place], [place] [address]  Glucose, capillary     Status: Abnormal   Collection Time: 05/19/22 12:18 AM  Result Value Ref Range   Glucose-Capillary 105 (H) 70 - 99 mg/dL    Comment: Glucose reference range applies only to samples taken after fasting for at least 8 hours.  Glucose, capillary     Status: None   Collection Time: 05/19/22  5:09 AM  Result Value Ref Range   Glucose-Capillary 98 70 - 99 mg/dL    Comment: Glucose reference  range applies only to samples taken after fasting for at least 8 hours.  Glucose, capillary     Status: Abnormal   Collection Time: 05/19/22  7:19 AM  Result Value Ref Range   Glucose-Capillary 105 (H) 70 - 99 mg/dL    Comment: Glucose reference range applies only to samples taken after fasting for at least 8 hours.    Current Facility-Administered Medications  Medication Dose Route Frequency Provider Last Rate Last Admin   acetaminophen (TYLENOL) suppository 325 mg  325 mg Rectal Q4H PRN Cherene Altes, MD   325 mg at 05/19/22 1105   Ampicillin-Sulbactam (UNASYN) 3 g in sodium chloride 0.9 % 100 mL IVPB  3 g Intravenous Q6H Donne Hazel, MD 200 mL/hr at 05/19/22 1627 3 g at 05/19/22 1627   Chlorhexidine Gluconate Cloth 2 % PADS 6 each  6 each Topical Daily Cherene Altes, MD   6 each at 05/19/22 1105   enoxaparin (LOVENOX) injection 40 mg  40 mg Subcutaneous Q24H Joette Catching T, MD   40 mg at 05/18/22 2352   ketorolac (TORADOL) 15 MG/ML injection 15 mg  15 mg Intravenous Q8H PRN Bowser, Laurel Dimmer, NP   15 mg at 05/19/22 1513   lidocaine (LIDODERM) 5 % 1 patch  1 patch Transdermal Q24H Cristal Generous, NP   1 patch at 05/19/22 1628   mupirocin ointment (BACTROBAN) 2 % 1 Application  1 Application Nasal BID Juanito Doom, MD   1 Application at 87/68/11 1103   ondansetron (ZOFRAN) tablet 4 mg  4 mg Oral Q6H PRN Cherene Altes, MD       Or   ondansetron [facility]) injection 4 mg  4 mg Intravenous Q6H PRN Cherene Altes, MD       Oral care mouth rinse  15 mL Mouth Rinse PRN Candee Furbish, MD       sodium  chloride flush (NS) 0.9 % injection 10-40 mL  10-40 mL Intracatheter PRN Cherene Altes, MD   10 mL at 05/17/22 1733   TPN ADULT (ION)   Intravenous Continuous TPN Polly Cobia, RPH 85 mL/hr at 05/18/22 1744 New Bag at 05/18/22 1744   TPN ADULT (ION)   Intravenous Continuous TPN Donne Hazel, MD        Musculoskeletal: Strength & Muscle Tone: decreased Gait  & Station: unable to stand Patient leans: Left            Psychiatric Specialty Exam:  Presentation  General Appearance:  Appropriate for Environment; Casual  Eye Contact: Fair  Speech: Clear and Coherent; Slow  Speech Volume: Decreased  Handedness: Right   Mood and Affect  Mood: Depressed  Affect: Appropriate; Congruent   Thought Process  Thought Processes: Coherent; Linear  Descriptions of Associations:Intact  Orientation:Full (Time, Place and Person)  Thought Content:Logical  History of Schizophrenia/Schizoaffective disorder:No data recorded Duration of Psychotic Symptoms:No data recorded Hallucinations:Hallucinations: None  Ideas of Reference:None  Suicidal Thoughts:Suicidal Thoughts: Yes, Active SI Active Intent and/or Plan: With Intent; With Plan; With Access to Means; With Means to Carry Out  Homicidal Thoughts:Homicidal Thoughts: No   Sensorium  Memory: Immediate Fair; Recent Fair; Remote Fair  Judgment: Fair  Insight: Fair   Community education officer  Concentration: Fair  Attention Span: Fair  Recall: Good  Fund of Knowledge: Good  Language: Good   Psychomotor Activity  Psychomotor Activity: Psychomotor Activity: Normal   Assets  Assets: Communication Skills; Desire for Improvement; Leisure Time; Physical Health; Resilience; Social Support   Sleep  Sleep: Sleep: Fair   Physical Exam: Physical Exam Vitals and nursing note reviewed.  Constitutional:      General: He is awake.     Appearance: He is underweight.  Neurological:     Mental Status: He is alert and easily aroused.    Review of Systems  Psychiatric/Behavioral:  Positive for depression and suicidal ideas. Negative for hallucinations, memory loss and substance abuse. The patient is nervous/anxious. The patient does not have insomnia.   All other systems reviewed and are negative.  Blood pressure (!) 133/58, pulse 81, temperature 98.4 F  (36.9 C), temperature source Oral, resp. rate (!) 22, height 5' 10"  (1.778 m), weight 64.7 kg, SpO2 99 %. Body mass index is 20.47 kg/m.  Treatment Plan Summary: Daily contact with patient to assess and evaluate symptoms and progress in treatment, Medication management, and Plan    -Will order palliative medicine consult to help improve quality of life, reduce suffering, symptoms/pain management, and establish goals of care. -At this time patient is willing to voluntarily be admitted to inpatient psychiatric facility.  In the event patient attempts to leave hospital , he will need to be placed under involuntary commitment. -Psychiatry consult service will continue to follow at this time.  Will see patient daily, consider starting SSRI tomorrow if medically stable. -TOC consult for inpatient psychiatric gero unit once medically stable.   Disposition: Recommend psychiatric Inpatient admission when medically cleared.  Suella Broad, FNP 05/19/2022 5:29 PM

## 2022-05-19 NOTE — Progress Notes (Signed)
PHARMACY - TOTAL PARENTERAL NUTRITION CONSULT NOTE   Indication: Crohn's disease and complex past surgical history including ex lap with small bowel resection, lysis of adhesion, mesh explantation, and diverting loop ileostomy in April 2023, on TPN prior to admission  Patient Measurements: Height: 5' 10"  Height: 5' 10"  (177.8 cm) Weight: 64.7 kg (142 lb 10.2 oz) IBW/kg (Calculated) : 73   Body mass index is 20.47 kg/m.  Assessment: Patient with PMH of Crohn's disease and complex surgical history as above on chronic TPN who presented on 05/13/22 with altered mental status after spouse found patient unresponsive with multiple pill bottles open around him. Patient was transported from SNF to ED the day prior for generalized weakness and acute worsening of his chronic abdominal pain. Patient decided to return home instead of his SNF. Pharmacy consulted to resume TPN as inpatient.   Discharge from [facility] 04/19/22:  TPN pharmacist at [facility]:  Estimated Nutrient Requirements: Protein: 1.5 - 1.7 g/kg/day (98 - 104 g/day), Total kcal: 30 - 35 kcal/kg/day (1830 - 1983 kcal/day)  TPN Formulation at [facility] per TPN pharmacist: [A 6.6%, D 20%, F 3.1%] @ goal rate, 65 mL/hr (TV: 1560 mL/day) provides: Protein 1.7 g/kg/day 103 g/day, Total kcal 32 kcal/kg/day 1971 kcal/day  NPC: N ratio 94   Admitted at [facility] 04/29/22-05/03/22: Nutritional Goals: TPN 80 mL/hr (provides 113 g of protein and 1943 kcals per day) RD Assessment: (10/16): 2100-2450kcal (30-35kcal/kg), Protein 105-130g (1.5-1.8g/kg) Utilize intralipid rather than SMOF as 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  [facility]: [name], PharmD, ph: [phone] 10/28 Most recent formula: Total volume 1560 mL:  Dextrose 220 g/day, Plenamine 120 g/day, SMOFlipid 50 g/day, NaCl 75 mEq, CaGluc 6 mEq, Potassium Ac 20 mEq, Potassium Phos 13.5 nM, Mag Sulfate 8 mEq, Vitamins/trace  elements  Glucose / Insulin: No hx DM -CBGs < 180 - SSI discontinued. Will also DC the glucose checks  Electrolytes:  Of note labs drawn late 11/2  K+ is 3.8 after repletion and increased amount in TPN yesterday; others stable WNL or improved to WNL including CorrCa of 9.5  Renal: SCr, BUN slightly high at 26; UOP borderline low or incompletely charted Hepatic: small uptrend in LFTs. Bili, TG stable WNL; albumin remains low and decreasing I/O: ileostomy output peaked 10/30, now decreased to ~500 ml/day GI Imaging: - 10/27 CTa/p: no significant interval change since 10/14 CT. No evidence of bowel obstruction or other complicating feature at the anastomotic site or ostomy. - 10/14 CTa/p: previous surgical changes noted (transverse-sigmoid anastomosis) with stable inflammation since 10/12 CT GI Surgeries / Procedures: n/a  Central access: prior to admission - double lumen CVC TPN start date: started PTA  Nutritional Goals: TPN goal rate is 85 mL/hr and provides 122 g of protein and 2018 kcals per day.  RD Assessment this admission: 10/30  Estimated Needs Total Energy Estimated Needs: 2015-2350kcal (30-35kcal/kg) Total Protein Estimated Needs: 100-135 (1.5-2.0g/kg) Total Fluid Estimated Needs: 1700-2160m  Current Nutrition:  NPO  Plan:  Continue TPN at 85 mL/hr Electrolytes in TPN: increase K Na 45 mEq/ K 60 mEq/L Ca 5 mEq/L Mg 5 mEq/L Phos 20 mmol/L Cl:Ac = 1:2 Add standard MVI and trace elements to TPN Will also add pepcid to TPN while ordered D/c SSI with well-controlled CBGs Monitor TPN labs on Mon/Thurs at minimum BMP, magnesium, phosphorus tomorrow AM    NRoyetta Asal PharmD, BCPS 05/19/2022 9:01 AM

## 2022-05-19 NOTE — Progress Notes (Signed)
BSE completed - full report to follow.  Pt currently sleepy but participative.  He is grossly weak, endorses h/o dysphagia - coughing with liquids more than foods his "whole life".  Today pt xerostomic without significant dysarthria but marginally weak cough.    After oral care, po of ice chips and italian ice provided.  Multiple swallows with each bolus *up to 7 swallows* with each bolus - causing him to cough post-swallow.  Expectoration of viscous green/yellow tinged secretions achieved - but even after pt advised "lugie" was clear, he continued with clinical indications of severe dysphagia - ? Suspect retention and laryngeal infiltration.  Differential phases of swallow- pharyngeal and/or cervical esophageal deficits.    At this time, recommend only single small ice chips and tsps of water after oral care with strict precautions to decrease disuse muscle atrophy.  Do not recommend po medications due to level of dysphagia.  Pt endorses that his current dysphagia is much worse now than baseline - ? Source? ? Deconditioning?    If approved to have small amount of barium - MBS (when clinically judged to be ready) would be indicated to elucidate dysphagia.  Thanks so much.    Kathleen Lime, MS [facility] SLP Acute Rehab Services Office [phone] Pager [phone]

## 2022-05-19 NOTE — Evaluation (Signed)
Clinical/Bedside Swallow Evaluation Patient Details  Name: [name] MRN: [id number] Date of Birth: 03-10-1951  Today's Date: 05/19/2022 Time: SLP Start Time (ACUTE ONLY): 90 SLP Stop Time (ACUTE ONLY): 1045 SLP Time Calculation (min) (ACUTE ONLY): 40 min  Past Medical History:  Past Medical History:  Diagnosis Date   Acute prostatitis 07/24/2007   Qualifier: Diagnosis of  By: [name] MD, [name]    Allergy    mild   Arthritis    osteoarthritis   Asthma    Blood transfusion without reported diagnosis    BPH (benign prostatic hypertrophy) with urinary obstruction    Crohn's ileitis ([place]) suspected 05/03/2017   Dilated aortic root ([place])    noted on echo 08/2012   Diverticulitis of colon    EPIDIDYMITIS 02/15/2010   Qualifier: Diagnosis of  By: [name] MD, [name]    GERD (gastroesophageal reflux disease)    H/O: GI bleed    Hemorrhoids    Hepatitis 1975   unknown type    HERPES SIMPLEX INFECTION 10/14/2007   Qualifier: Diagnosis of  By: [name] MD, [name]    Hiatal hernia    Ileus following gastrointestinal surgery ([place]) 12/26/2011   Long term (current) use of systemic steroids 06/18/2018   Psoriasis    sees [name] at [facility].   Recurrent ventral incisional hernia 05/10/2012   SVT (supraventricular tachycardia)    Ulcer 08/21/2013   ileal   Past Surgical History:  Past Surgical History:  Procedure Laterality Date   BOWEL RESECTION  12/19/2011   Procedure: SMALL BOWEL RESECTION;  Surgeon: [name], MD;  Location: [facility];  Service: General;  Laterality: N/A;  with anastamosis and insertion mesh   BRONCHOSCOPY     COLON SURGERY  01/2004   x 2   COLONOSCOPY W/ BIOPSIES  04/26/2017   per [name], no polyps, benign inflammation, repeat in 5 yrs    CYSTOSCOPY     ESOPHAGOGASTRODUODENOSCOPY     HEMICOLECTOMY     left side, at [facility], diverticulitis   [name]     [phone] incisional hernia   ILEOSTOMY     ILEOSTOMY  CLOSURE     INSERTION OF MESH  07/31/2012   Procedure: INSERTION OF MESH;  Surgeon: [name], MD;  Location: [facility];  Service: General;;   LAPAROTOMY  12/19/2011   Procedure: EXPLORATORY LAPAROTOMY;  Surgeon: [name], MD;  Location: [facility];  Service: General;  Laterality: N/A;   PACEMAKER IMPLANT N/A 12/01/2020   Procedure: PACEMAKER IMPLANT;  Surgeon: [name], MD;  Location: [place] [facility];  Service: Cardiovascular;  Laterality: N/A;   PACEMAKER INSERTION Left    TONSILLECTOMY     UPPER GASTROINTESTINAL ENDOSCOPY     VASECTOMY     VENTRAL HERNIA REPAIR  07/31/2012   Procedure: HERNIA REPAIR VENTRAL ADULT;  Surgeon: [name], MD;  Location: [facility];  Service: General;  Laterality: N/A;   HPI:  Pt is a 71 yo male admit 10/28 after being obtunded found down 10/30 - ? polypharmacy,  tx to ICU for intubation, 10/31 waking up and following commands overnight.  He was extubated 11/1 and failed swallow screen thus SLP eval was ordered.  He also has h/o Crohns disease - complex hx including small bowel perf -surgical history, short gut- TPN.  MRI showed No acute intracranial abnormality.  2. Small amount of nonspecific T2 hyperintense lesions of the  white  matter, may represent early chronic microangiopathy.  Chest imaging concerning for right infrahilar airspace consolidation concerning  for pneumonia or aspiration.    Assessment / Plan / Recommendation  Clinical Impression  Pt currently sleepy but participative.  He is grossly weak, endorses h/o dysphagia - coughing with liquids more than foods his "whole life".  Today pt xerostomic without significant dysarthria but marginally weak cough.         After oral care, po of ice chips and italian ice provided.  Multiple swallows with each bolus *up to 7 swallows* with each bolus - causing him to cough post-swallow.  Expectoration of viscous green/yellow tinged secretions achieved - but even after pt advised  "lugie" was clear, he continued with clinical indications of severe dysphagia - ? Suspect retention and laryngeal infiltration.  Differential phases of swallow- pharyngeal and/or cervical esophageal and esophageal deficits.         At this time, recommend only single small ice chips and tsps of water after oral care with strict precautions to decrease disuse muscle atrophy.  Do not recommend po medications due to level of dysphagia.  Pt endorses that his current dysphagia is much worse now than baseline - ? Source? ? Deconditioning?        Pt educated to recommendations using teach back and posted swallow precaution signs.   If approved to have small amount of barium - MBS (when clinically judged to be ready) would be indicated to elucidate dysphagia.  Thanks so much.  SLP Visit Diagnosis: Dysphagia, pharyngeal phase (R13.13);Dysphagia, pharyngoesophageal phase (R13.14);Dysphagia, unspecified (R13.10)    Aspiration Risk  Severe aspiration risk;Risk for inadequate nutrition/hydration    Diet Recommendation NPO;Ice chips PRN after oral care (tsps thin water)   Liquid Administration via: Spoon (water only) Medication Administration: Via alternative means Compensations: Multiple dry swallows after each bite/sip Postural Changes: Seated upright at 90 degrees;Remain upright for at least 30 minutes after po intake    Other  Recommendations Oral Care Recommendations: Oral care prior to ice chip/H20 Other Recommendations: Have oral suction available    Recommendations for follow up therapy are one component of a multi-disciplinary discharge planning process, led by the attending physician.  Recommendations may be updated based on patient status, additional functional criteria and insurance authorization.  Follow up Recommendations SLP at Long-term acute care hospital      Assistance Recommended at Discharge Frequent or constant Supervision/Assistance  Functional Status Assessment Patient has had  a recent decline in their functional status and demonstrates the ability to make significant improvements in function in a reasonable and predictable amount of time.  Frequency and Duration min 2x/week  1 week       Prognosis Prognosis for Safe Diet Advancement: Guarded Barriers to Reach Goals: Time post onset;Severity of deficits      Swallow Study   General Date of Onset: 05/19/22 HPI: Pt is a 71 yo male admit 10/28 after being obtunded found down 10/30 - ? polypharmacy,  tx to ICU for intubation, 10/31 waking up and following commands overnight.  He was extubated 11/1 and failed swallow screen thus SLP eval was ordered.  He also has h/o Crohns disease - complex hx including small bowel perf -surgical history, short gut- TPN.  MRI showed No acute intracranial abnormality.  2. 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  Chest imaging concerning for right infrahilar airspace consolidation concerning  for pneumonia or aspiration. Type  of Study: Bedside Swallow Evaluation Diet Prior to this Study: NPO;TNA Temperature Spikes Noted: No Respiratory Status: Nasal cannula History of Recent Intubation: No Behavior/Cognition: Alert;Distractible;Requires cueing Oral Cavity Assessment: Within Functional Limits Oral Care Completed by SLP: Yes Oral Cavity - Dentition: Adequate natural dentition Vision: Impaired for self-feeding Self-Feeding Abilities: Able to feed self Patient Positioning: Upright in bed Baseline Vocal Quality: Normal Volitional Cough: Strong Volitional Swallow: Able to elicit    Oral/Motor/Sensory Function Overall Oral Motor/Sensory Function: Generalized oral weakness (? minimal lingual deviation to the right upon protrusion)   Ice Chips Ice chips: Impaired (Italian ice and ice chips) Presentation: Spoon Pharyngeal Phase Impairments: Multiple swallows   Thin Liquid Thin Liquid: Impaired Presentation:  Spoon Pharyngeal  Phase Impairments: Cough - Delayed;Multiple swallows    Nectar Thick Nectar Thick Liquid: Not tested   Honey Thick Honey Thick Liquid: Not tested   Puree Puree: Not tested   Solid    Kathleen Lime, MS [facility] SLP Acute Rehab Services Office [phone] Pager [phone]  Solid: Not tested      Macario Golds 05/19/2022,2:36 PM

## 2022-05-19 NOTE — Progress Notes (Signed)
  Progress Note   Patient: [name] ZJI:[id number] DOB: 10/29/1950 DOA: 05/13/2022     6 DOS: the patient was seen and examined on 05/19/2022   Brief hospital course: 71yo with hx Crohn's with persistent abd pain s/p extensive surgeries and is currently TPN dependent. Pt presented with unresponsiveness in apparent suicide attempt via overdose. Pt remained unresponsive and later was unable to maintain airway, prompting intubation and transfer to ICU. Pt now extubated and care transferred to hospitalist.  Assessment and Plan: Intentional overdose, depression -Psychiatry following. Recs for inpatient Psych -Per Psychiatry, consideration to start SSRI in the next day or so -Sitter at bedside this AM  Toxic metabolic encephalopathy -Appropriately conversant -Suspect mental status change related to intentional medication overdose -Home meds were on hold per PCCM -Recent CT head and MRI brain reviewed, neg for acute pathology  Aspiration PNA -cont on unasyn -Did not pass SLP eval, NPO at this time -Ultimately, would consider MBS when abld to tolerate small amount of barium  Chron's disease -s/p multiple surgeries in past -Currently TPN dependent  Severe protein calorie malnutrition -On TPN -Recheck cmp in  AM  Chronic pain -Was on morphine PTA -Now on toradol PRN -Agree with Palliative Care consultation to assist with pain and anxiety management  Afib -Rate controlled at this time -Noted to be transient event while in ICU that quickly resolved  Chronic prostatitis -Continued on Unasyn at this time -Once able to tolerate PO, would resume suppressive bactrim and oxybutynin   Bladder calculi -Recommend f/u with Urology as outpatient -Renal function normal  Anemia -Stable -No evidence of acute blood loss      Subjective: Tearful. Primary concerns stemming around pain and symptom control  Physical Exam: Vitals:   05/18/22 1426 05/18/22 1732 05/19/22 0516 05/19/22  1351  BP: 123/72 130/71 125/86 (!) 133/58  Pulse: 69 67 79 81  Resp: 19 (!) 22    Temp:  (!) 97.5 F (36.4 C) 98.9 F (37.2 C) 98.4 F (36.9 C)  TempSrc:   Axillary Oral  SpO2: 100% 97% 97% 99%  Weight:      Height:       General exam: Awake, laying in bed Respiratory system: Normal respiratory effort, no wheezing Cardiovascular system: regular rate, s1, s2 Gastrointestinal system: Soft, nondistended, positive BS Central nervous system: CN2-12 grossly intact, strength intact Extremities: Perfused, no clubbing Skin: Normal skin turgor, post-op scar over suprapubic region Psychiatry: Mood depressed // tearful at times   Data Reviewed:  Labs reviewed: Na 140, K 3.8, Cr 0.8, WBC 6.9, Hgb 9.2, Plts 181   Family Communication: Pt in room, family at bedside  Disposition: Status is: Inpatient Remains inpatient appropriate because: Severity of illness  Planned Discharge Destination:  Inpatient Psychiatry    Author: Marylu Lund, MD 05/19/2022 6:18 PM  For on call review [email or web address].

## 2022-05-19 NOTE — TOC Progression Note (Addendum)
Transition of Care [facility]) - Progression Note    Patient Details  Name: OSTEN JANEK MRN: [id number] Date of Birth: August 18, 1950  [facility] CM/SW Contact  Lennart Pall, LCSW Phone Number: 05/19/2022, 3:31 PM  Clinical Narrative:    Per psychiatry consult on 11/2, recommendation has been made for voluntary inpatient gero psych admission.  Have spoken with pt's wife, son and sister today and all in agreement with this plan.  Wife admits she is concerned she is not getting regular communication about her husband's status.  She asks that she be the primary contact for pt and adds that she is "fine if information is shared with Lysbeth Galas (son) but I am the decision maker here."  Have alerted tx team.  She is noted as 1st contact on chart information.   Will begin bed search process when pt is medically cleared.   Expected Discharge Plan:  (TBD) Barriers to Discharge: Continued Medical Work up  Expected Discharge Plan and Services Expected Discharge Plan:  (TBD)   Discharge Planning Services: CM Consult Post Acute Care Choice: [place] Living arrangements for the past 2 months: Single Family Home                                       Social Determinants of Health (SDOH) Interventions    Readmission Risk Interventions    05/15/2022   10:53 AM  Readmission Risk Prevention Plan  Transportation Screening Complete  Medication Review (RN Care Manager) Complete  PCP or Specialist appointment within 3-5 days of discharge Complete  HRI or Home Care Consult Complete  SW Recovery Care/Counseling Consult Complete  [place] Complete

## 2022-05-19 NOTE — Hospital Course (Addendum)
[phone] with hx Crohn's with persistent abd pain s/p extensive surgeries and is currently TPN dependent. Pt presented with unresponsiveness in apparent suicide attempt via overdose. Pt remained unresponsive and later was unable to maintain airway, prompting intubation and transfer to ICU. Pt now extubated and care transferred to hospitalist.  Medically stable.  Requiring inpatient psychiatric placement. Now plan is to go to his sister's place in [place] with outpatient psychiatric support and home health.  Most likely on Thursday.

## 2022-05-19 NOTE — Care Management Important Message (Signed)
Important Message  Patient Details IM Letter placed in Patients room. Name: [name] MRN: [id number] Date of Birth: Jul 09, 1951   Medicare Important Message Given:  Yes     Kerin Salen 05/19/2022, 10:02 AM

## 2022-05-20 ENCOUNTER — Encounter (HOSPITAL_COMMUNITY): Payer: Self-pay | Admitting: Internal Medicine

## 2022-05-20 DIAGNOSIS — G9341 Metabolic encephalopathy: Secondary | ICD-10-CM | POA: Diagnosis not present

## 2022-05-20 DIAGNOSIS — K50918 Crohn's disease, unspecified, with other complication: Secondary | ICD-10-CM | POA: Diagnosis not present

## 2022-05-20 DIAGNOSIS — R401 Stupor: Secondary | ICD-10-CM | POA: Diagnosis not present

## 2022-05-20 DIAGNOSIS — R531 Weakness: Secondary | ICD-10-CM

## 2022-05-20 DIAGNOSIS — D649 Anemia, unspecified: Secondary | ICD-10-CM | POA: Diagnosis not present

## 2022-05-20 LAB — BASIC METABOLIC PANEL
Anion gap: 7 (ref 5–15)
BUN: 31 mg/dL — ABNORMAL HIGH (ref 8–23)
CO2: 23 mmol/L (ref 22–32)
Calcium: 8.4 mg/dL — ABNORMAL LOW (ref 8.9–10.3)
Chloride: 109 mmol/L (ref 98–111)
Creatinine, Ser: 1.1 mg/dL (ref 0.61–1.24)
GFR, Estimated: >60 mL/min (ref >60)
Glucose, Bld: 107 mg/dL — ABNORMAL HIGH (ref 70–99)
Potassium: 4.3 mmol/L (ref 3.5–5.1)
Sodium: 139 mmol/L (ref 135–145)

## 2022-05-20 LAB — MAGNESIUM: Magnesium: 2.3 mg/dL (ref 1.7–2.4)

## 2022-05-20 LAB — PHOSPHORUS: Phosphorus: 5.1 mg/dL — ABNORMAL HIGH (ref 2.5–4.6)

## 2022-05-20 MED ORDER — FLUOXETINE HCL 20 MG/5ML PO SOLN
10.0000 mg | Freq: Every day | ORAL | Status: AC
  Administered 2022-05-20: 10 mg via ORAL
  Filled 2022-05-20: qty 5

## 2022-05-20 MED ORDER — LACTATED RINGERS IV SOLN: INTRAVENOUS | Status: AC

## 2022-05-20 MED ORDER — TRAVASOL 10 % IV SOLN
INTRAVENOUS | Status: AC
  Filled 2022-05-20: qty 1224

## 2022-05-20 MED ORDER — MORPHINE SULFATE (PF) 2 MG/ML IV SOLN
1.0000 mg | Freq: Once | INTRAVENOUS | Status: AC
  Administered 2022-05-20: 1 mg via INTRAVENOUS
  Filled 2022-05-20: qty 1

## 2022-05-20 MED ORDER — FLUOXETINE HCL 20 MG/5ML PO SOLN
20.0000 mg | Freq: Every day | ORAL | Status: AC
  Administered 2022-05-21: 20 mg via ORAL
  Filled 2022-05-20: qty 5

## 2022-05-20 NOTE — Progress Notes (Signed)
  Progress Note   Patient: [name] BDZ:[id number] DOB: 1951/01/03 DOA: 05/13/2022     7 DOS: the patient was seen and examined on 05/20/2022   Brief hospital course: 71yo with hx Crohn's with persistent abd pain s/p extensive surgeries and is currently TPN dependent. Pt presented with unresponsiveness in apparent suicide attempt via overdose. Pt remained unresponsive and later was unable to maintain airway, prompting intubation and transfer to ICU. Pt now extubated and care transferred to hospitalist.  Assessment and Plan: Intentional overdose, depression -Psychiatry following. Recs for inpatient Psych -Discussed with Psychiatry. Recs for trial of SL prozac -Sitter at bedside today  Toxic metabolic encephalopathy -Appropriately conversant -Suspect mental status change related to intentional medication overdose -Home meds were on hold per PCCM -Recent CT head and MRI brain reviewed, neg for acute pathology -Mentation seems improved compared to yesterday, family at bedside agrees  Aspiration PNA -cont on unasyn -Did not pass SLP eval, NPO at this time -Ultimately, would consider MBS when abld to tolerate small amount of barium  Chron's disease -s/p multiple surgeries in past -Currently TPN dependent  Severe protein calorie malnutrition -On TPN -Recheck cmp in  AM -Membranes appear somewhat dry. Pt reports hx dehydration in past even when eating with TPN -Will cont on basal fluids  Chronic pain -Was on morphine PTA -Now on toradol PRN -Palliative Care consulted to assist with symptom management in light of hx uncontrolled pain -At present, pt is complaining of marked lower pain, knees flexed -Pt is mentation well and is oriented. Will give one dose morphine  Afib -Rate controlled at this time -Noted to be transient event while in ICU that quickly resolved  Chronic prostatitis -Continued on Unasyn at this time -Once able to tolerate PO, would resume suppressive  bactrim and oxybutynin   Bladder calculi -Recommend f/u with Urology as outpatient -Renal function unremarkable  Anemia -Stable -No evidence of acute blood loss      Subjective: More awake and conversant. Complaining of on-going chronic lower abd pains  Physical Exam: Vitals:   05/18/22 1732 05/19/22 0516 05/19/22 1351 05/19/22 2143  BP: 130/71 125/86 (!) 133/58 (!) 148/62  Pulse: 67 79 81 91  Resp: (!) 22   16  Temp: (!) 97.5 F (36.4 C) 98.9 F (37.2 C) 98.4 F (36.9 C) 99.2 F (37.3 C)  TempSrc:  Axillary Oral Axillary  SpO2: 97% 97% 99% 98%  Weight:      Height:       General exam: Conversant, in no acute distress Respiratory system: normal chest rise, clear, no audible wheezing Cardiovascular system: regular rhythm, s1-s2 Gastrointestinal system: Nondistended, pos bs Central nervous system: No seizures, no tremors Extremities: No cyanosis, no joint deformities Skin: No rashes, no pallor Psychiatry: Affect normal // no auditory hallucinations   Data Reviewed:  Labs reviewed: Na 139, K 4.3, Cr 1.1   Family Communication: Pt in room, family at bedside  Disposition: Status is: Inpatient Remains inpatient appropriate because: Severity of illness  Planned Discharge Destination:  Inpatient Psychiatry    Author: Marylu Lund, MD 05/20/2022 5:30 PM  For on call review [email or web address].

## 2022-05-20 NOTE — Consult Note (Signed)
Face-to-Face Psychiatry Consult   Reason for Consult:  suicide attempt  Referring Physician:  Red Christians   Patient Identification: [name] MRN:  [id number] Principal Diagnosis: Obtundation Diagnosis:  Principal Problem:   Obtundation Active Problems:   Metabolic encephalopathy   Chronic anemia   Acute respiratory failure with hypoxia (HCC)   Severe malnutrition ([place])   Total Time spent with patient: 45 minutes  Subjective:   71yo with hx Crohn's with persistent abd pain s/p extensive surgeries and is currently TPN dependent. Pt presented with unresponsiveness in apparent suicide attempt via overdose. Pt remained unresponsive and later was unable to maintain airway, prompting intubation and transfer to ICU. Pt now extubated and care transferred to hospitalist.   HPI:    Pt interviewed with wife and son at bedside.  He is still very suicidal, active with intent, no plan. He wishes that he had not survived his suicide attempt. He is at severe risk of suicide without treatment and hospitalization. He has numerous risk factors for suicide including race, age, acute and chronic pain, medical decline, sense of wellbeing, untreated psychiatric illness, h/o suicide.  He reports that his mood is very depressed. Reports anhedonia. Reports high level of anxiety. Reports poor sleep, hunger (pt is npo on tpn). Denies HI and psychotic symptoms.   Supportive psychotherapy provided.  We extensively discussed the treatment plan including sublingual prozac and psychiatric hospitalization.  Pt is in agreement and gives verbal consent.   I communicated option about sublingual prozac to primary team and pharmacist, and we reached agreement on this. Pharmacist to call nurse to discuss administration instructions.     Past Psychiatric History: Depression  Risk to Self:   yes suicide attempt Risk to Others:   Denies Prior Inpatient Therapy:   Denies Prior Outpatient Therapy:   Denies  Past  Medical History:  Past Medical History:  Diagnosis Date   Acute prostatitis 07/24/2007   Qualifier: Diagnosis of  By: [name] MD, [name]    Allergy    mild   Arthritis    osteoarthritis   Asthma    Blood transfusion without reported diagnosis    BPH (benign prostatic hypertrophy) with urinary obstruction    Crohn's ileitis ([place]) suspected 05/03/2017   Dilated aortic root ([place])    noted on echo 08/2012   Diverticulitis of colon    EPIDIDYMITIS 02/15/2010   Qualifier: Diagnosis of  By: [name] MD, [name]    GERD (gastroesophageal reflux disease)    H/O: GI bleed    Hemorrhoids    Hepatitis 1975   unknown type    HERPES SIMPLEX INFECTION 10/14/2007   Qualifier: Diagnosis of  By: [name] MD, [name]    Hiatal hernia    Ileus following gastrointestinal surgery ([place]) 12/26/2011   Long term (current) use of systemic steroids 06/18/2018   Psoriasis    sees [name] at [facility].   Recurrent ventral incisional hernia 05/10/2012   SVT (supraventricular tachycardia)    Ulcer 08/21/2013   ileal    Past Surgical History:  Procedure Laterality Date   BOWEL RESECTION  12/19/2011   Procedure: SMALL BOWEL RESECTION;  Surgeon: [name], MD;  Location: [facility];  Service: General;  Laterality: N/A;  with anastamosis and insertion mesh   BRONCHOSCOPY     COLON SURGERY  01/2004   x 2   COLONOSCOPY W/ BIOPSIES  04/26/2017   per [name], no polyps, benign inflammation, repeat in 5 yrs  CYSTOSCOPY     ESOPHAGOGASTRODUODENOSCOPY     HEMICOLECTOMY     left side, at [facility], diverticulitis   HEMORRHOID BANDING     HERNIA REPAIR     2'6333 incisional hernia   ILEOSTOMY     ILEOSTOMY CLOSURE     INSERTION OF MESH  07/31/2012   Procedure: INSERTION OF MESH;  Surgeon: [name], MD;  Location: [facility];  Service: General;;   LAPAROTOMY  12/19/2011   Procedure: EXPLORATORY LAPAROTOMY;  Surgeon: [name], MD;  Location: [facility];  Service: General;  Laterality:  N/A;   PACEMAKER IMPLANT N/A 12/01/2020   Procedure: PACEMAKER IMPLANT;  Surgeon: [name], MD;  Location: [place] [facility];  Service: Cardiovascular;  Laterality: N/A;   PACEMAKER INSERTION Left    TONSILLECTOMY     UPPER GASTROINTESTINAL ENDOSCOPY     VASECTOMY     VENTRAL HERNIA REPAIR  07/31/2012   Procedure: HERNIA REPAIR VENTRAL ADULT;  Surgeon: [name], MD;  Location: [facility];  Service: General;  Laterality: N/A;   Family History:  Family History  Problem Relation Age of Onset   Lung cancer Mother    Leukemia Father    Hypertension Father    Heart disease Father    Heart attack Father    Prostate cancer Father    Prostate cancer Paternal Uncle    Colon cancer Neg Hx    Stomach cancer Neg Hx    Colon polyps Neg Hx    Esophageal cancer Neg Hx    Rectal cancer Neg Hx    Family Psychiatric  History: none reported  Social History:  Social History   Substance and Sexual Activity  Alcohol Use Not Currently   Comment: occ     Social History   Substance and Sexual Activity  Drug Use Yes   Types: Oxycodone    Social History   Socioeconomic History   Marital status: Married    Spouse name: Not on file   Number of children: 2   Years of education: Not on file   Highest education level: Not on file  Occupational History   Occupation: EHS Freight forwarder    Employer: [employer] education [employer]  Tobacco Use   Smoking status: Never   Smokeless tobacco: Never  Vaping Use   Vaping Use: Never used  Substance and Sexual Activity   Alcohol use: Not Currently    Comment: occ   Drug use: Yes    Types: Oxycodone   Sexual activity: Not on file  Other Topics Concern   Not on file  Social History Narrative   He is married with 2 sons 1 son is an Art gallery manager and 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   He is Nurse, 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    Rare if any caffeine   Rare alcohol and never smoker   Social Determinants of Radio broadcast assistant Strain: Low Risk  (04/10/2022)   Overall Financial Resource Strain (CARDIA)    Difficulty of Paying Living Expenses: Not hard at all  Food Insecurity: No Food Insecurity (05/14/2022)   Hunger Vital Sign    Worried About Running Out of Food in the Last Year: Never true    Ran Out of Food in the Last Year: Never true  Transportation Needs: No Transportation Needs (05/14/2022)   [place] - Transportation  Lack of Transportation (Medical): No    Lack of Transportation (Non-Medical): No  Physical Activity: Inactive (04/10/2022)   Exercise Vital Sign    Days of Exercise per Week: 0 days    Minutes of Exercise per Session: 0 min  Stress: Stress Concern Present (04/10/2022)   [place]    Feeling of Stress : To some extent  Social Connections: Not on file   Additional Social History:    Allergies:   Allergies  Allergen Reactions   Purinethol [Mercaptopurine] Other (See Comments)    Pancreatitis    Shellfish Allergy Anaphylaxis and Swelling    Can use standard SMOF lipid formulation for TPN without any issue.    Humira [Adalimumab] Other (See Comments)    Developed antibodies   Tape Rash   Wound Dressing Adhesive Rash    Labs:  Results for orders placed or performed during the hospital encounter of 05/13/22 (from the past 48 hour(s))  Glucose, capillary     Status: None   Collection Time: 05/18/22  4:29 PM  Result Value Ref Range   Glucose-Capillary 91 70 - 99 mg/dL    Comment: Glucose reference range applies only to samples taken after fasting for at least 8 hours.  Glucose, capillary     Status: None   Collection Time: 05/18/22  8:16 PM  Result Value Ref Range   Glucose-Capillary 93 70 - 99 mg/dL    Comment: Glucose reference range applies only to samples taken after fasting for at least 8 hours.    Comment 1 Notify RN   Basic metabolic panel     Status: Abnormal   Collection Time: 05/18/22 10:02 PM  Result Value Ref Range   Sodium 140 135 - 145 mmol/L   Potassium 3.8 3.5 - 5.1 mmol/L   Chloride 112 (H) 98 - 111 mmol/L   CO2 22 22 - 32 mmol/L   Glucose, Bld 106 (H) 70 - 99 mg/dL    Comment: Glucose reference range applies only to samples taken after fasting for at least 8 hours.   BUN 26 (H) 8 - 23 mg/dL   Creatinine, Ser 0.80 0.61 - 1.24 mg/dL   Calcium 8.2 (L) 8.9 - 10.3 mg/dL   GFR, Estimated >60 >60 mL/min    Comment: (NOTE) Calculated using the CKD-EPI Creatinine Equation (2021)    Anion gap 6 5 - 15    Comment: Performed at [facility], [place] [address] [place], [place] [address]  Glucose, capillary     Status: Abnormal   Collection Time: 05/19/22 12:18 AM  Result Value Ref Range   Glucose-Capillary 105 (H) 70 - 99 mg/dL    Comment: Glucose reference range applies only to samples taken after fasting for at least 8 hours.  Glucose, capillary     Status: None   Collection Time: 05/19/22  5:09 AM  Result Value Ref Range   Glucose-Capillary 98 70 - 99 mg/dL    Comment: Glucose reference range applies only to samples taken after fasting for at least 8 hours.  Glucose, capillary     Status: Abnormal   Collection Time: 05/19/22  7:19 AM  Result Value Ref Range   Glucose-Capillary 105 (H) 70 - 99 mg/dL    Comment: Glucose reference range applies only to samples taken after fasting for at least 8 hours.  Basic metabolic panel     Status: Abnormal   Collection Time: 05/20/22  4:19 AM  Result Value  Ref Range   Sodium 139 135 - 145 mmol/L   Potassium 4.3 3.5 - 5.1 mmol/L   Chloride 109 98 - 111 mmol/L   CO2 23 22 - 32 mmol/L   Glucose, Bld 107 (H) 70 - 99 mg/dL    Comment: Glucose reference range applies only to samples taken after fasting for at least 8 hours.   BUN 31 (H) 8 - 23 mg/dL   Creatinine, Ser 1.10 0.61 - 1.24 mg/dL   Calcium 8.4 (L) 8.9 -  10.3 mg/dL   GFR, Estimated >60 >60 mL/min    Comment: (NOTE) Calculated using the CKD-EPI Creatinine Equation (2021)    Anion gap 7 5 - 15    Comment: Performed at [facility], [place] [address], [place], [place] [address]  Magnesium     Status: None   Collection Time: 05/20/22  4:19 AM  Result Value Ref Range   Magnesium 2.3 1.7 - 2.4 mg/dL    Comment: Performed at [facility], [place] [address] [place], [place] [address]  Phosphorus     Status: Abnormal   Collection Time: 05/20/22  4:19 AM  Result Value Ref Range   Phosphorus 5.1 (H) 2.5 - 4.6 mg/dL    Comment: Performed at [facility], [place] [address], [place], [place] [address]    Current Facility-Administered Medications  Medication Dose Route Frequency Provider Last Rate Last Admin   acetaminophen (TYLENOL) suppository 325 mg  325 mg Rectal Q4H PRN Cherene Altes, MD   325 mg at 05/19/22 1105   Ampicillin-Sulbactam (UNASYN) 3 g in sodium chloride 0.9 % 100 mL IVPB  3 g Intravenous Q6H Donne Hazel, MD 200 mL/hr at 05/20/22 1021 3 g at 05/20/22 1021   Chlorhexidine Gluconate Cloth 2 % PADS 6 each  6 each Topical Daily Cherene Altes, MD   6 each at 05/20/22 1005   diphenhydrAMINE (BENADRYL) injection 12.5 mg  12.5 mg Intravenous QHS PRN Donne Hazel, MD       enoxaparin (LOVENOX) injection 40 mg  40 mg Subcutaneous Q24H Joette Catching T, MD   40 mg at 05/19/22 2338   ketorolac (TORADOL) 15 MG/ML injection 15 mg  15 mg Intravenous Q8H PRN Cristal Generous, NP   15 mg at 05/20/22 1035   lidocaine (LIDODERM) 5 % 1 patch  1 patch Transdermal Q24H Cristal Generous, NP   1 patch at 05/19/22 1628   ondansetron (ZOFRAN) tablet 4 mg  4 mg Oral Q6H PRN Cherene Altes, MD       Or   ondansetron [facility]) injection 4 mg  4 mg Intravenous Q6H PRN Cherene Altes, MD       Oral care mouth rinse  15 mL Mouth Rinse PRN Candee Furbish, MD       sodium chloride flush (NS)  0.9 % injection 10-40 mL  10-40 mL Intracatheter PRN Cherene Altes, MD   10 mL at 05/17/22 1733   TPN ADULT (ION)   Intravenous Continuous TPN Donne Hazel, MD 85 mL/hr at 05/19/22 1736 New Bag at 05/19/22 1736   TPN ADULT (ION)   Intravenous Continuous TPN Shade, Haze Justin, RPH        Musculoskeletal: Strength & Muscle Tone: Laying in bed   Gait & Station: Laying in bed   Patient leans: Laying in bed              Psychiatric Specialty Exam:  Presentation  General Appearance:  Fairly Groomed  Eye Contact: Good  Speech: Slow  Speech Volume: Normal  Handedness: Right   Mood and Affect  Mood: Anxious; Depressed; Worthless; Hopeless  Affect: Tearful; Full Range   Thought Process  Thought Processes: Linear  Descriptions of Associations:Intact  Orientation:Full (Time, Place and Person)  Thought Content:Logical  Hallucinations:Hallucinations: None  Ideas of Reference:None  Suicidal Thoughts:Suicidal Thoughts: Yes, Active SI Active Intent and/or Plan: With Intent; Without Plan  Homicidal Thoughts:Homicidal Thoughts: No   Sensorium  Memory: Immediate Good; Recent Good; Remote Good  Judgment: Impaired  Insight: Fair   Materials engineer: Fair  Attention Span: Fair  Recall: Good  Fund of Knowledge: Good  Language: Good   Psychomotor Activity  Psychomotor Activity: Psychomotor Activity: Normal   Assets  Assets: Communication Skills; Desire for Improvement; Leisure Time; Physical Health; Resilience; Social Support   Sleep  Sleep: Sleep: Fair   Physical Exam: Physical Exam Vitals reviewed.    Review of Systems  Psychiatric/Behavioral:  Positive for depression and suicidal ideas. The patient is nervous/anxious and has insomnia.    Blood pressure (!) 148/62, pulse 91, temperature 99.2 F (37.3 C), temperature source Axillary, resp. rate 16, height 5' 10"  (1.778 m), weight 64.7 kg, SpO2  98 %. Body mass index is 20.47 kg/m.  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 [email or web address]   -Pt can have access to his cell phone when family is present.    Disposition: Recommend psychiatric Inpatient admission when medically cleared.  Christoper Allegra, MD 05/20/2022 4:12 PM  Total Time Spent in Direct Patient Care:  I personally spent 70 minutes on the unit in direct patient care. The direct patient care time included face-to-face time with the patient, reviewing the patient's chart, communicating with other professionals, and coordinating care. Greater than 50% of this time was spent in counseling or coordinating care with the patient regarding goals of hospitalization, psycho-education, and discharge planning needs.   Janine Limbo, MD Psychiatrist

## 2022-05-20 NOTE — Progress Notes (Signed)
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  Assessment: Patient with PMH of Crohn's disease and complex surgical history as above on chronic TPN who presented on 05/13/22 with altered mental status after spouse found patient unresponsive with multiple pill bottles open around him. Patient was transported from SNF to ED the day prior for generalized weakness and acute worsening of his chronic abdominal pain. Patient decided to return home instead of his SNF. Pharmacy consulted to resume TPN as inpatient.   Discharge from [facility] 04/19/22:  TPN pharmacist at [facility]:  Estimated Nutrient Requirements: Protein: 1.5 - 1.7 g/kg/day (98 - 104 g/day), Total kcal: 30 - 35 kcal/kg/day (1830 - 1983 kcal/day)  TPN Formulation at [place] per TPN pharmacist: [A 6.6%, D 20%, F 3.1%] @ goal rate, 65 mL/hr (TV: 1560 mL/day) provides: Protein 1.7 g/kg/day 103 g/day, Total kcal 32 kcal/kg/day 1971 kcal/day  NPC: N ratio 94   Admitted at [facility] 04/29/22-05/03/22: Nutritional Goals: TPN 80 mL/hr (provides 113 g of protein and 1943 kcals per day) RD Assessment: (10/16): 2100-2450kcal (30-35kcal/kg), Protein 105-130g (1.5-1.8g/kg) Utilize intralipid rather than SMOF as 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  [facility]: [name], PharmD, ph: [phone] 10/28 Most recent formula: Total volume 1560 mL:  Dextrose 220 g/day, Plenamine 120 g/day, SMOFlipid 50 g/day, NaCl 75 mEq, CaGluc 6 mEq, Potassium Ac 20 mEq, Potassium Phos 13.5 nM, Mag Sulfate 8 mEq, Vitamins/trace  elements  Glucose / Insulin: No hx DM, CBGs < 180 - SSI discontinued. Will also DC the glucose checks  Electrolytes:  K+ up to 4.3, Phos elevated at 5.1 (outside TPN phos replacement: Kphos 10/30, NaPhos 11/1), Mag 2.3 near upper limit, others WNL.  Renal: SCr increasing (0.6 > 0.8 > 1.1), BUN increased to 31; UOP increased. Hepatic: small uptrend in LFTs. Bili, TG stable WNL; albumin remains low and decreasing (last on 11/2) I/O: ileostomy output peaked 10/30, now decreased to 350 mL.  UOP increased to 1850 mL.  Overall, net + 424m  GI Imaging: - 10/27 CTa/p: no significant interval change since 10/14 CT. No evidence of bowel obstruction or other complicating feature at the anastomotic site or ostomy. - 10/14 CTa/p: previous surgical changes noted (transverse-sigmoid anastomosis) with stable inflammation since 10/12 CT GI Surgeries / Procedures: n/a  Central access: prior to admission - double lumen CVC TPN start date: started PTA  Nutritional Goals: TPN goal rate is 85 mL/hr and provides 122 g of protein and 2018 kcals per day.  RD Assessment this admission:  11/2 - Continues on TPN at higher calorie provision to prevent further weight loss.  Estimated Needs Total Energy Estimated Needs: 2015-2350kcal (30-35kcal/kg) Total Protein Estimated Needs: 100-135 (1.5-2.0g/kg) Total Fluid Estimated Needs: 1700-21060m Current Nutrition:  NPO  Plan:  Continue TPN at 85 mL/hr Electrolytes in TPN:  Na 45 mEq/ K 40 mEq/L Ca 5 mEq/L Mg 2.5 mEq/L Phos 10 mmol/L Cl:Ac = 1:2 Add standard MVI and trace elements to TPN Add pepcid to TPN  D/c SSI with well-controlled CBGs Monitor TPN labs on Mon/Thurs at minimum BMP, magnesium, phosphorus  tomorrow AM   Gretta Arab PharmD, BCPS [facility] [phone] 05/20/2022 8:49 AM

## 2022-05-20 NOTE — Plan of Care (Signed)
  Problem: Education: Goal: Knowledge of General Education information will improve Description: Including pain rating scale, medication(s)/side effects and non-pharmacologic comfort measures Outcome: Progressing   Problem: Pain Managment: Goal: General experience of comfort will improve Outcome: Progressing   Problem: Safety: Goal: Ability to remain free from injury will improve Outcome: Progressing   

## 2022-05-21 ENCOUNTER — Encounter (HOSPITAL_COMMUNITY): Payer: Self-pay | Admitting: Internal Medicine

## 2022-05-21 DIAGNOSIS — R531 Weakness: Secondary | ICD-10-CM | POA: Diagnosis not present

## 2022-05-21 DIAGNOSIS — R401 Stupor: Secondary | ICD-10-CM | POA: Diagnosis not present

## 2022-05-21 DIAGNOSIS — K50918 Crohn's disease, unspecified, with other complication: Secondary | ICD-10-CM | POA: Diagnosis not present

## 2022-05-21 DIAGNOSIS — D649 Anemia, unspecified: Secondary | ICD-10-CM | POA: Diagnosis not present

## 2022-05-21 LAB — BASIC METABOLIC PANEL
Anion gap: 7 (ref 5–15)
BUN: 33 mg/dL — ABNORMAL HIGH (ref 8–23)
CO2: 21 mmol/L — ABNORMAL LOW (ref 22–32)
Calcium: 8.3 mg/dL — ABNORMAL LOW (ref 8.9–10.3)
Chloride: 110 mmol/L (ref 98–111)
Creatinine, Ser: 1.02 mg/dL (ref 0.61–1.24)
GFR, Estimated: >60 mL/min (ref >60)
Glucose, Bld: 91 mg/dL (ref 70–99)
Potassium: 4 mmol/L (ref 3.5–5.1)
Sodium: 138 mmol/L (ref 135–145)

## 2022-05-21 LAB — PHOSPHORUS: Phosphorus: 3.3 mg/dL (ref 2.5–4.6)

## 2022-05-21 LAB — MAGNESIUM: Magnesium: 2.3 mg/dL (ref 1.7–2.4)

## 2022-05-21 LAB — GLUCOSE, CAPILLARY
Glucose-Capillary: 104 mg/dL — ABNORMAL HIGH (ref 70–99)
Glucose-Capillary: 98 mg/dL (ref 70–99)

## 2022-05-21 MED ORDER — TRAVASOL 10 % IV SOLN
INTRAVENOUS | Status: AC
  Filled 2022-05-21: qty 1224

## 2022-05-21 MED ORDER — HYDROMORPHONE HCL 1 MG/ML IJ SOLN
0.5000 mg | Freq: Four times a day (QID) | INTRAMUSCULAR | Status: DC | PRN
  Administered 2022-05-21 – 2022-06-03 (×32): 0.5 mg via INTRAVENOUS
  Filled 2022-05-21 (×34): qty 0.5

## 2022-05-21 MED ORDER — FLUOXETINE HCL 20 MG/5ML PO SOLN
20.0000 mg | Freq: Every day | ORAL | Status: DC
  Administered 2022-05-22: 20 mg via ORAL
  Filled 2022-05-21: qty 5

## 2022-05-21 NOTE — Consult Note (Signed)
Consultation Note Date: 05/21/2022   Patient Name: [name]  DOB: April 17, 1951  MRN: [id number]  Age / Sex: 71 y.o., male  PCP: [name], MD Referring Physician: Donne Hazel, MD  Reason for Consultation: Establishing goals of care and Pain control  HPI/Patient Profile: 71 y.o. male   admitted on 05/13/2022  .   Clinical Assessment and Goals of Care: 71 year old gentleman status post extensive abdominal surgeries, currently TPN dependent, admitted to hospital medicine service with psychiatry following closely for unresponsiveness in apparent suicide attempt via overdose, on home medications patient has morphine sulfate immediate release, patient was unresponsive unable to maintain airway prompting intubation and transferred to ICU, when he was extubated he was transferred to hospitalist care, psychiatry following closely because of intentional overdose and suicidal intent, patient has a Actuary, is a being tried on sublingual Prozac, CT head and MRI brain has also been done, he is on antibiotics for aspiration pneumonia, SLP to reevaluate, has had multiple surgeries because of Crohn's disease, has severe protein calorie malnutrition and is on TPN.  Palliative medicine team consulted for pain management and for additional support Patient is awake alert resting in bed, his son Lysbeth Galas is present at the bedside Palliative medicine is specialized medical care for people living with serious illness. It focuses on providing relief from the symptoms and stress of a serious illness. The goal is to improve quality of life for both the patient and the family. Goals of care: Broad aims of medical therapy in relation to the patient's values and preferences. Our aim is to provide medical care aimed at enabling patients to achieve the goals that matter most to them, given the circumstances of their particular medical  situation and their constraints.  Patient is quite tearful, brief life review performed, goals wishes and values attempted to be explored.  Discussed about patient's current condition, discussed about optimal pain management along with maintaining awakeness and alertness and avoiding respiratory depression.  NEXT OF KIN  Wife and son.  Sister lives in [place] area.   SUMMARY OF RECOMMENDATIONS   Full code/full scope care Add low-dose IV hydromorphone every 6 hours on an as-needed basis and continue to monitor, extensive discussions with patient as well as son who was present at bedside about low-dose opioids for pain management while current care is being pursued Continue current mode of care Appreciate psychiatry input Thank you for the consult Discharge disposition appears to be inpatient psychiatry, currently on TPN  Code Status/Advance Care Planning: Full code   Symptom Management:     Palliative Prophylaxis:  Delirium Protocol  Additional Recommendations (Limitations, Scope, Preferences): Full Scope Treatment  Psycho-social/Spiritual:  Desire for further Chaplaincy support:yes Additional Recommendations: Caregiving  Support/Resources  Prognosis:  Unable to determine  Discharge Planning: To Be Determined      Primary Diagnoses: Present on Admission:  Obtundation   I have reviewed the medical record, interviewed the patient and family, and examined the patient. The following aspects are pertinent.  Past Medical History:  Diagnosis Date   Acute prostatitis 07/24/2007   Qualifier: Diagnosis of  By: [name] MD, [name]    Allergy    mild   Arthritis    osteoarthritis   Asthma    Blood transfusion without reported diagnosis    BPH (benign prostatic hypertrophy) with urinary obstruction    Crohn's ileitis ([place]) suspected 05/03/2017   Dilated aortic root ([place])    noted on echo 08/2012   Diverticulitis of colon    EPIDIDYMITIS 02/15/2010   Qualifier: Diagnosis of   By: [name] MD, [name]    GERD (gastroesophageal reflux disease)    H/O: GI bleed    Hemorrhoids    Hepatitis 1975   unknown type    HERPES SIMPLEX INFECTION 10/14/2007   Qualifier: Diagnosis of  By: [name] MD, [name]    Hiatal hernia    Ileus following gastrointestinal surgery ([facility]) 12/26/2011   Long term (current) use of systemic steroids 06/18/2018   Psoriasis    sees [name] at [facility]   Recurrent ventral incisional hernia 05/10/2012   SVT (supraventricular tachycardia)    Ulcer 08/21/2013   ileal   Social History   Socioeconomic History   Marital status: Married    Spouse name: Not on file   Number of children: 2   Years of education: Not on file   Highest education level: Not on file  Occupational History   Occupation: EHS manager    Employer: [employer]  Tobacco Use   Smoking status: Never   Smokeless tobacco: Never  Vaping Use   Vaping Use: Never used  Substance and Sexual Activity   Alcohol use: Not Currently    Comment: occ   Drug use: Yes    Types: Oxycodone   Sexual activity: Not on file  Other Topics Concern   Not on file  Social History Narrative   He is married with 2 sons 1 son is an Art gallery manager and 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   He is Nurse, 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   Rare if any caffeine   Rare alcohol and never smoker   Social Determinants of Radio broadcast assistant Strain: [place]  (04/10/2022)   Overall Financial Resource Strain (CARDIA)    Difficulty of Paying Living Expenses: Not hard at all  Food Insecurity: No Food Insecurity (05/14/2022)   Hunger Vital Sign    Worried About Running Out of Food in the Last Year: Never true    [place] in the Last Year: Never true  Transportation Needs: No Transportation Needs (05/14/2022)   PRAPARE - Hydrologist  (Medical): No    Lack of Transportation (Non-Medical): No  Physical Activity: Inactive (04/10/2022)   Exercise Vital Sign    Days of Exercise per Week: 0 days    Minutes of Exercise per Session: 0 min  Stress: Stress Concern Present (04/10/2022)   [place]    Feeling of Stress : To some extent  Social Connections: Not on file   Family History  Problem Relation Age of Onset   Lung cancer Mother    Leukemia Father    Hypertension Father    Heart disease Father    Heart attack Father    Prostate cancer Father    Prostate  cancer Paternal Uncle    Colon cancer Neg Hx    Stomach cancer Neg Hx    Colon polyps Neg Hx    Esophageal cancer Neg Hx    Rectal cancer Neg Hx    Scheduled Meds:  Chlorhexidine Gluconate Cloth  6 each Topical Daily   enoxaparin (LOVENOX) injection  40 mg Subcutaneous Q24H   lidocaine  1 patch Transdermal Q24H   Continuous Infusions:  lactated ringers 75 mL/hr at 05/21/22 0409   TPN ADULT (ION) 85 mL/hr at 05/20/22 1732   TPN ADULT (ION)     PRN Meds:.acetaminophen, diphenhydrAMINE, HYDROmorphone (DILAUDID) injection, ketorolac, ondansetron **OR** ondansetron (ZOFRAN) IV, mouth rinse, sodium chloride flush Medications Prior to Admission:  Prior to Admission medications   Medication Sig Start Date End Date Taking? Authorizing Provider  acetaminophen (TYLENOL) 325 MG tablet Take 650 mg by mouth every 6 (six) hours as needed for moderate pain.   Yes [provider]  albuterol (PROVENTIL HFA;VENTOLIN HFA) 108 (90 Base) MCG/ACT inhaler INHALE 2 PUFFS INTO THE LUNGS EVERY 4 (FOUR) HOURS AS NEEDED FOR WHEEZING OR SHORTNESS OF BREATH. 10/24/18  Yes [name], MD  [place] Ac Elect-Calc in D15W [facility] E/DEXTROSE, 5/15, IV) Inject 65 mL/hr into the vein continuous. Via powerline TPN IV (104gm aa/183 GM DEC/50GM)   Yes [provider]  busPIRone (BUSPAR) 5 MG tablet Take 5 mg by mouth  3 (three) times daily.   Yes [provider]  calcium carbonate (TUMS - DOSED IN MG ELEMENTAL CALCIUM) 500 MG chewable tablet Chew 500 mg by mouth 3 (three) times daily as needed for indigestion or heartburn.   Yes [provider]  melatonin 3 MG TABS tablet Take 3 mg by mouth at bedtime.   Yes [provider]  METAMUCIL FIBER PO Take 1 packet by mouth in the morning.   Yes [provider]  morphine (MSIR) 15 MG tablet Take 15 mg by mouth every 4 (four) hours as needed for severe pain.   Yes [provider]  Multiple Vitamin (MULTIVITAMIN WITH MINERALS) TABS tablet Take 1 tablet by mouth daily.   Yes [provider]  omeprazole (PRILOSEC) 20 MG capsule Take 20 mg by mouth in the morning.   Yes [provider]  ondansetron (ZOFRAN-ODT) 4 MG disintegrating tablet Take 4 mg by mouth every 6 (six) hours as needed for nausea or vomiting.   Yes [provider]  OVER THE COUNTER MEDICATION Take 120 mLs by mouth See admin instructions. Medpass 163m twice daily   Yes [provider]  oxybutynin (DITROPAN) 5 MG tablet Take 1 tablet (5 mg total) by mouth 3 (three) times daily. 05/03/22  Yes Shalhoub, GSherryll Burger MD  simethicone (MYLICON) 80 MG chewable tablet Chew 80 mg by mouth 4 (four) times daily -  with meals and at bedtime.   Yes [provider]  sulfamethoxazole-trimethoprim (BACTRIM DS) 800-160 MG tablet Take 1 tablet by mouth every 12 (twelve) hours for 25 days. Patient taking differently: Take 1 tablet by mouth every 12 (twelve) hours. 25 day course. From 10/18-11/12 05/03/22 05/28/22 Yes Shalhoub, GSherryll Burger MD  tamsulosin (FLOMAX) 0.4 MG CAPS capsule Take 1 capsule (0.4 mg total) by mouth daily after supper. 05/03/22  Yes Shalhoub, GSherryll Burger MD  traZODone (DESYREL) 50 MG tablet Take 100 mg by mouth at bedtime.   Yes [provider]   Allergies  Allergen Reactions   Purinethol [Mercaptopurine] Other (See  Comments)    Pancreatitis  Shellfish Allergy Anaphylaxis and Swelling    Can use standard SMOF lipid formulation for TPN without any issue.    Humira [Adalimumab] Other (See Comments)    Developed antibodies   Tape Rash   Wound Dressing Adhesive Rash   Review of Systems Complains of abdominal pain, complains of generalized pain Physical Exam Awake alert oriented Very tearful Regular work of breathing Appears with generalized weakness No edema  Vital Signs: BP 121/65 (BP Location: Right Arm)   Pulse 84   Temp 98.5 F (36.9 C) (Oral)   Resp 16   Ht 5' 10"  (1.778 m)   Wt 64.7 kg   SpO2 96%   BMI 20.47 kg/m  Pain Scale: 0-10 POSS *See Group Information*: 1-Acceptable,Awake and alert Pain Score: Asleep   SpO2: SpO2: 96 % O2 Device:SpO2: 96 % O2 Flow Rate: .O2 Flow Rate (L/min): 100 L/min  IO: Intake/output summary:  Intake/Output Summary (Last 24 hours) at 05/21/2022 1227 Last data filed at 05/21/2022 1208 Gross per 24 hour  Intake 1947.84 ml  Output 3070 ml  Net -1122.16 ml    LBM: Last BM Date : 05/19/22 Baseline Weight: Weight: 67.2 kg Most recent weight: Weight: 64.7 kg     Palliative Assessment/Data:   PPS 50%  Time In:  11.30 Time Out:  12.30 Time Total:  60  Greater than 50%  of this time was spent counseling and coordinating care related to the above assessment and plan.  Signed by: Loistine Chance, MD   Please contact Palliative Medicine Team phone at [phone] for questions and concerns.  For individual provider: See Shea Evans

## 2022-05-21 NOTE — Progress Notes (Signed)
Physical Therapy Treatment Patient Details Name: [name] MRN: [id number] DOB: Jun 17, 1951 Today's Date: 05/21/2022   History of Present Illness 71 year old male with complicated past medical history as outlined below, which is significant for Crohn's disease with persistent abdominal pain.  On April of this year he underwent exploratory laparotomy with small bowel resection, repair of enterotomy, and creation of a diverting loop ileostomy at [facility].  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  He has been residing in [facility] SNF from where he presented to the emergency department on 10/27 for pain at the site of his ostomy with foul-smelling discharge.  Work-up of the ostomy and abdomen including CT imaging was unremarkable.  He was discharged to SNF, however, rather than returning to SNF he found private transport to his home.Then on 10/28 he again presented to [facility] long emergency department with chief complaint of unresponsiveness.11/2 reveals that ingestion of pills at home was in fact a suicide attempt. 1:1 sitter, psych consult    PT Comments    Pt pleased to mobilize reporting buttocks and back pain.  Pt assisted to recliner and requiring mod +2 assist for safety due to weakness.  Pt reports concern about his inability to ambulate and feeling very weak.  Pt has not yet been OOB until today per his report (admitted 8 days ago).  Pt encouraged to be OOB a couple times a day with nursing.  Current plan upon d/c is for inpatient geripsych.  Pt not yet mobilizing very well and requiring assist, so may need SNF first?    Recommendations for follow up therapy are one component of a multi-disciplinary discharge planning process, led by the attending physician.  Recommendations may be updated based on patient status, additional functional criteria and insurance authorization.  Follow Up Recommendations  Skilled nursing-short term rehab (<3 hours/day) (plan is for inpatient  geripsych) Can patient physically be transported by private vehicle: No   Assistance Recommended at Discharge Frequent or constant Supervision/Assistance  Patient can return home with the following A little help with bathing/dressing/bathroom;Assist for transportation;Help with stairs or ramp for entrance;A lot of help with walking and/or transfers   Equipment Recommendations  None recommended by PT    Recommendations for Other Services       Precautions / Restrictions Precautions Precautions: Fall Precaution Comments: right colostomy     Mobility  Bed Mobility Overal bed mobility: Needs Assistance Bed Mobility: Supine to Sit     Supine to sit: Mod assist     General bed mobility comments: assist for trunk upright and LEs over EOB    Transfers Overall transfer level: Needs assistance Equipment used: Rolling walker (2 wheels) Transfers: Sit to/from Stand, Bed to chair/wheelchair/BSC Sit to Stand: Mod assist, +2 physical assistance   Step pivot transfers: Mod assist, +2 physical assistance       General transfer comment: assist to rise and steady, performed twice, pt assisted over to recliner with cues for small steps and upright posture, difficulty lifting LEs, effortful movement    Ambulation/Gait                   Stairs             Wheelchair Mobility    Modified Rankin (Stroke Patients Only)       Balance Overall balance assessment: Needs assistance         Standing balance support: Bilateral upper extremity supported, Reliant on assistive device for balance Standing balance-Leahy Scale: Poor  Cognition Arousal/Alertness: Awake/alert Behavior During Therapy: WFL for tasks assessed/performed Overall Cognitive Status: Impaired/Different from baseline Area of Impairment: Following commands, Safety/judgement                       Following Commands: Follows one step commands with  increased time       General Comments: more interactive today and wanting to participate        Exercises      General Comments        Pertinent Vitals/Pain Pain Assessment Pain Assessment: Faces Faces Pain Scale: Hurts even more Pain Location: bottom, back Pain Descriptors / Indicators: Discomfort, Grimacing Pain Intervention(s): Repositioned, Monitored during session (RN in room to assess skin with mobility)    Home Living                          Prior Function            PT Goals (current goals can now be found in the care plan section) Progress towards PT goals: Progressing toward goals    Frequency    Min 2X/week      PT Plan Discharge plan needs to be updated    Co-evaluation              AM-PAC PT "6 Clicks" Mobility   Outcome Measure  Help needed turning from your back to your side while in a flat bed without using bedrails?: A Lot Help needed moving from lying on your back to sitting on the side of a flat bed without using bedrails?: A Lot Help needed moving to and from a bed to a chair (including a wheelchair)?: A Lot Help needed standing up from a chair using your arms (e.g., wheelchair or bedside chair)?: Total Help needed to walk in hospital room?: Total Help needed climbing 3-5 steps with a railing? : Total 6 Click Score: 9    End of Session Equipment Utilized During Treatment: Gait belt Activity Tolerance: Patient tolerated treatment well;Patient limited by fatigue Patient left: in chair;with call bell/phone within reach;with family/visitor present;with nursing/sitter in room Freight forwarder)   PT Visit Diagnosis: Difficulty in walking, not elsewhere classified (R26.2)     Time: 1245-1301 PT Time Calculation (min) (ACUTE ONLY): 16 min  Charges:  $Therapeutic Activity: 8-22 mins                     Jannette Spanner PT, DPT Physical Therapist Acute Rehabilitation Services Preferred contact method: Secure Chat Weekend Pager Only:  [phone] Office: [phone]    Myrtis Hopping [place] 05/21/2022, 2:55 PM

## 2022-05-21 NOTE — Consult Note (Signed)
Face-to-Face Psychiatry Consult   Reason for Consult:  suicide attempt  Referring Physician:  Red Christians   Patient Identification: [name] MRN:  [id number] Principal Diagnosis: Obtundation Diagnosis:  Principal Problem:   Obtundation Active Problems:   Metabolic encephalopathy   Chronic anemia   Acute respiratory failure with hypoxia (HCC)   Severe malnutrition ([place])   Total Time spent with patient: 45 minutes  Subjective:   71yo with hx Crohn's with persistent abd pain s/p extensive surgeries and is currently TPN dependent. Pt presented with unresponsiveness in apparent suicide attempt via overdose. Pt remained unresponsive and later was unable to maintain airway, prompting intubation and transfer to ICU. Pt now extubated and care transferred to hospitalist.   HPI:   Pt attempted to interview twice (PT doing eval once, then later pt was asleep and tried multiple times to awaken but he did not).   Pt case discussed with wife and son at bedside.   They say that pt overall has no change in mood, anxiety, or suicidal thoughts since last evaluation yesterday. They state he has no s/e since starting SL prozac except he does not like the taste. They report he told son that he had a dream about him putting a bomb at his son's house - we discussed the interpretation of this as the pt has guilt about his suicide attempt and how it has affected his son/family.   Pt remains at severe risk of suicide without inpatient psychiatric admission and treatment.     Past Psychiatric History: Depression  Risk to Self:   yes suicide attempt Risk to Others:   Denies Prior Inpatient Therapy:   Denies Prior Outpatient Therapy:   Denies  Past Medical History:  Past Medical History:  Diagnosis Date   Acute prostatitis 07/24/2007   Qualifier: Diagnosis of  By: [name] MD, [name]    Allergy    mild   Arthritis    osteoarthritis   Asthma    Blood transfusion without reported diagnosis    BPH  (benign prostatic hypertrophy) with urinary obstruction    Crohn's ileitis ([place]) suspected 05/03/2017   Dilated aortic root ([place])    noted on echo 08/2012   Diverticulitis of colon    EPIDIDYMITIS 02/15/2010   Qualifier: Diagnosis of  By: [name] MD, [name]    GERD (gastroesophageal reflux disease)    H/O: GI bleed    Hemorrhoids    Hepatitis 1975   unknown type    HERPES SIMPLEX INFECTION 10/14/2007   Qualifier: Diagnosis of  By: [name] MD, [name]    Hiatal hernia    Ileus following gastrointestinal surgery ([place]) 12/26/2011   Long term (current) use of systemic steroids 06/18/2018   Psoriasis    sees [name] at [facility].   Recurrent ventral incisional hernia 05/10/2012   SVT (supraventricular tachycardia)    Ulcer 08/21/2013   ileal    Past Surgical History:  Procedure Laterality Date   BOWEL RESECTION  12/19/2011   Procedure: SMALL BOWEL RESECTION;  Surgeon: [name], MD;  Location: [facility];  Service: General;  Laterality: N/A;  with anastamosis and insertion mesh   BRONCHOSCOPY     COLON SURGERY  01/2004   x 2   COLONOSCOPY W/ BIOPSIES  04/26/2017   per [name], no polyps, benign inflammation, repeat in 5 yrs    CYSTOSCOPY     ESOPHAGOGASTRODUODENOSCOPY     HEMICOLECTOMY     left side, at [facility],  diverticulitis   HEMORRHOID BANDING     HERNIA REPAIR     8'8325 incisional hernia   ILEOSTOMY     ILEOSTOMY CLOSURE     INSERTION OF MESH  07/31/2012   Procedure: INSERTION OF MESH;  Surgeon: [name], MD;  Location: [facility];  Service: General;;   LAPAROTOMY  12/19/2011   Procedure: EXPLORATORY LAPAROTOMY;  Surgeon: [name], MD;  Location: [facility];  Service: General;  Laterality: N/A;   PACEMAKER IMPLANT N/A 12/01/2020   Procedure: PACEMAKER IMPLANT;  Surgeon: [name], MD;  Location: [facility];  Service: Cardiovascular;  Laterality: N/A;   PACEMAKER INSERTION Left    TONSILLECTOMY     UPPER GASTROINTESTINAL  ENDOSCOPY     VASECTOMY     VENTRAL HERNIA REPAIR  07/31/2012   Procedure: HERNIA REPAIR VENTRAL ADULT;  Surgeon: [name], MD;  Location: [facility];  Service: General;  Laterality: N/A;   Family History:  Family History  Problem Relation Age of Onset   Lung cancer Mother    Leukemia Father    Hypertension Father    Heart disease Father    Heart attack Father    Prostate cancer Father    Prostate cancer Paternal Uncle    Colon cancer Neg Hx    Stomach cancer Neg Hx    Colon polyps Neg Hx    Esophageal cancer Neg Hx    Rectal cancer Neg Hx    Family Psychiatric  History: none reported  Social History:  Social History   Substance and Sexual Activity  Alcohol Use Not Currently   Comment: occ     Social History   Substance and Sexual Activity  Drug Use Yes   Types: Oxycodone    Social History   Socioeconomic History   Marital status: Married    Spouse name: Not on file   Number of children: 2   Years of education: Not on file   Highest education level: Not on file  Occupational History   Occupation: EHS Freight forwarder    Employer: [employer] education [employer]  Tobacco Use   Smoking status: Never   Smokeless tobacco: Never  Vaping Use   Vaping Use: Never used  Substance and Sexual Activity   Alcohol use: Not Currently    Comment: occ   Drug use: Yes    Types: Oxycodone   Sexual activity: Not on file  Other Topics Concern   Not on file  Social History Narrative   He is married with 2 sons 1 son is an Art gallery manager and 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   He is Nurse, 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   Rare if any caffeine   Rare alcohol and never smoker   Social Determinants of Radio broadcast assistant Strain: Low Risk  (04/10/2022)   Overall Financial Resource Strain (CARDIA)    Difficulty of Paying Living Expenses: Not hard at all  Food  Insecurity: No Food Insecurity (05/14/2022)   Hunger Vital Sign    Worried About Running Out of Food in the Last Year: Never true    Ran Out of Food in the Last Year: Never true  Transportation Needs: No Transportation Needs (05/14/2022)   PRAPARE - Hydrologist (Medical): No    Lack of Transportation (Non-Medical): No  Physical Activity: Inactive (04/10/2022)  Exercise Vital Sign    Days of Exercise per Week: 0 days    Minutes of Exercise per Session: 0 min  Stress: Stress Concern Present (04/10/2022)   [place]    Feeling of Stress : To some extent  Social Connections: Not on file   Additional Social History:    Allergies:   Allergies  Allergen Reactions   Purinethol [Mercaptopurine] Other (See Comments)    Pancreatitis    Shellfish Allergy Anaphylaxis and Swelling    Can use standard SMOF lipid formulation for TPN without any issue.    Humira [Adalimumab] Other (See Comments)    Developed antibodies   Tape Rash   Wound Dressing Adhesive Rash    Labs:  Results for orders placed or performed during the hospital encounter of 05/13/22 (from the past 48 hour(s))  Basic metabolic panel     Status: Abnormal   Collection Time: 05/20/22  4:19 AM  Result Value Ref Range   Sodium 139 135 - 145 mmol/L   Potassium 4.3 3.5 - 5.1 mmol/L   Chloride 109 98 - 111 mmol/L   CO2 23 22 - 32 mmol/L   Glucose, Bld 107 (H) 70 - 99 mg/dL    Comment: Glucose reference range applies only to samples taken after fasting for at least 8 hours.   BUN 31 (H) 8 - 23 mg/dL   Creatinine, Ser 1.10 0.61 - 1.24 mg/dL   Calcium 8.4 (L) 8.9 - 10.3 mg/dL   GFR, Estimated >60 >60 mL/min    Comment: (NOTE) Calculated using the CKD-EPI Creatinine Equation (2021)    Anion gap 7 5 - 15    Comment: Performed at [facility], [place] [address], [place], [place] [address]  Magnesium     Status: None    Collection Time: 05/20/22  4:19 AM  Result Value Ref Range   Magnesium 2.3 1.7 - 2.4 mg/dL    Comment: Performed at [facility], [place] [address], [place], [place] [address]  Phosphorus     Status: Abnormal   Collection Time: 05/20/22  4:19 AM  Result Value Ref Range   Phosphorus 5.1 (H) 2.5 - 4.6 mg/dL    Comment: Performed at [facility], [place] [address] [place], [place] [address]  Basic metabolic panel     Status: Abnormal   Collection Time: 05/21/22  2:57 AM  Result Value Ref Range   Sodium 138 135 - 145 mmol/L   Potassium 4.0 3.5 - 5.1 mmol/L   Chloride 110 98 - 111 mmol/L   CO2 21 (L) 22 - 32 mmol/L   Glucose, Bld 91 70 - 99 mg/dL    Comment: Glucose reference range applies only to samples taken after fasting for at least 8 hours.   BUN 33 (H) 8 - 23 mg/dL   Creatinine, Ser 1.02 0.61 - 1.24 mg/dL   Calcium 8.3 (L) 8.9 - 10.3 mg/dL   GFR, Estimated >60 >60 mL/min    Comment: (NOTE) Calculated using the CKD-EPI Creatinine Equation (2021)    Anion gap 7 5 - 15    Comment: Performed at [facility], [place] [address] [place],  [address]  Magnesium     Status: None   Collection Time: 05/21/22  2:57 AM  Result Value Ref Range   Magnesium 2.3 1.7 - 2.4 mg/dL    Comment: Performed at [facility], [place] [address] [place], [place]  [address]  Phosphorus     Status: None   Collection Time: 05/21/22  2:57 AM  Result Value Ref Range   Phosphorus 3.3 2.5 - 4.6 mg/dL    Comment: Performed at [facility], [place] [address] [place], [place] [address]    Current Facility-Administered Medications  Medication Dose Route Frequency Provider Last Rate Last Admin   acetaminophen (TYLENOL) suppository 325 mg  325 mg Rectal Q4H PRN Cherene Altes, MD   325 mg at 05/19/22 1105   Chlorhexidine Gluconate Cloth 2 % PADS 6 each  6 each Topical Daily Cherene Altes, MD   6 each at 05/21/22  0936   diphenhydrAMINE (BENADRYL) injection 12.5 mg  12.5 mg Intravenous QHS PRN Donne Hazel, MD   12.5 mg at 05/21/22 0046   enoxaparin (LOVENOX) injection 40 mg  40 mg Subcutaneous Q24H Joette Catching T, MD   40 mg at 05/20/22 2126   [START ON 05/22/2022] FLUoxetine (PROZAC) 20 MG/5ML solution 20 mg  20 mg Oral Daily Neha Waight, MD       HYDROmorphone (DILAUDID) injection 0.5 mg  0.5 mg Intravenous Q6H PRN Loistine Chance, MD       ketorolac (TORADOL) 15 MG/ML injection 15 mg  15 mg Intravenous Q8H PRN Cristal Generous, NP   15 mg at 05/21/22 7902   lactated ringers infusion   Intravenous Continuous Donne Hazel, MD 75 mL/hr at 05/21/22 0409 New Bag at 05/21/22 0409   lidocaine (LIDODERM) 5 % 1 patch  1 patch Transdermal Q24H Cristal Generous, NP   1 patch at 05/20/22 1757   ondansetron (ZOFRAN) tablet 4 mg  4 mg Oral Q6H PRN Cherene Altes, MD       Or   ondansetron [facility]) injection 4 mg  4 mg Intravenous Q6H PRN Cherene Altes, MD   4 mg at 05/21/22 0047   Oral care mouth rinse  15 mL Mouth Rinse PRN Candee Furbish, MD       sodium chloride flush (NS) 0.9 % injection 10-40 mL  10-40 mL Intracatheter PRN Cherene Altes, MD   10 mL at 05/17/22 1733   TPN ADULT (ION)   Intravenous Continuous TPN Randa Spike, RPH 85 mL/hr at 05/20/22 1732 New Bag at 05/20/22 1732   TPN ADULT (ION)   Intravenous Continuous TPN Shade, Haze Justin, RPH        Musculoskeletal: Strength & Muscle Tone: Laying in bed   Gait & Station: Laying in bed   Patient leans: Laying in bed              Psychiatric Specialty Exam: pt asleep - could not assess today.   Physical Exam: Physical Exam Vitals reviewed.    ROS pt asleep unable to assess  Blood pressure 121/65, pulse 84, temperature 98.5 F (36.9 C), temperature source Oral, resp. rate 16, height 5' 10"  (1.778 m), weight 64.7 kg, SpO2 96 %. Body mass index is 20.47 kg/m.  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  [email or web address]   -Pt can have access to his cell  phone when family is present.    Disposition: Recommend psychiatric Inpatient admission when medically cleared.  Christoper Allegra, MD 05/21/2022 1:42 PM  Total Time Spent in Direct Patient Care:  I personally spent 25 minutes on the unit in direct patient care. The direct patient care time included face-to-face time with the patient, reviewing the patient's chart, communicating with other professionals, and coordinating care. Greater than 50% of this time was spent in counseling or coordinating care with the patient regarding goals of hospitalization, psycho-education, and discharge planning needs.   Janine Limbo, MD Psychiatrist

## 2022-05-21 NOTE — Progress Notes (Addendum)
Patient expressed suicidal feelings overnight and wishes to speak to psych when available. Foley catheter remains in place.

## 2022-05-21 NOTE — Progress Notes (Signed)
  Progress Note   Patient: [name] BEM:[id number] DOB: May 26, 1951 DOA: 05/13/2022     8 DOS: the patient was seen and examined on 05/21/2022   Brief hospital course: 71yo with hx Crohn's with persistent abd pain s/p extensive surgeries and is currently TPN dependent. Pt presented with unresponsiveness in apparent suicide attempt via overdose. Pt remained unresponsive and later was unable to maintain airway, prompting intubation and transfer to ICU. Pt now extubated and care transferred to hospitalist.  Assessment and Plan: Intentional overdose, depression -Psychiatry following. Recs for inpatient Psych -Discussed with Psychiatry. Recs to continue SL prozac -Sitter at bedside today  Toxic metabolic encephalopathy -Appropriately conversant -Suspect mental status change related to intentional medication overdose -Home meds were on hold per PCCM -Recent CT head and MRI brain reviewed, neg for acute pathology -Mentation seems much improved  Aspiration PNA -cont on unasyn -Did not pass SLP eval, NPO at this time -Ultimately, would consider MBS when abld to tolerate small amount of barium  Chron's disease -s/p multiple surgeries in past -Currently TPN dependent  Severe protein calorie malnutrition -On TPN -Recheck cmp in  AM -Membranes appear somewhat dry. Pt reports hx dehydration in past even when eating with TPN -Tolerating basal IVF  Chronic pain -Was on morphine PTA -Now on toradol PRN -Palliative Care consulted to assist with symptom management in light of hx uncontrolled pain -Appreciate assistance by Palliative Care. Recs noted for addition of PRN dilaudid  Afib -Rate controlled at this time -Noted to be transient event while in ICU that quickly resolved  Chronic prostatitis -Continued on Unasyn at this time -Once able to tolerate PO, would resume suppressive bactrim and oxybutynin   Bladder calculi -Recommend f/u with Urology as outpatient -Renal function  unremarkable  Anemia -Stable -No evidence of acute blood loss      Subjective: Tearful today. Reports dream overnight about placing bomb in son's house and blowing it up  Physical Exam: Vitals:   05/19/22 1351 05/19/22 2143 05/20/22 2159 05/21/22 0631  BP: (!) 133/58 (!) 148/62 117/63 121/65  Pulse: 81 91 88 84  Resp:  16 18 16   Temp: 98.4 F (36.9 C) 99.2 F (37.3 C) 98.1 F (36.7 C) 98.5 F (36.9 C)  TempSrc: Oral Axillary Oral Oral  SpO2: 99% 98% 99% 96%  Weight:      Height:       General exam: Awake, laying in bed, in nad Respiratory system: Normal respiratory effort, no wheezing Cardiovascular system: regular rate, s1, s2 Gastrointestinal system: Soft, nondistended, positive BS Central nervous system: CN2-12 grossly intact, strength intact Extremities: Perfused, no clubbing Skin: Normal skin turgor, no notable skin lesions seen Psychiatry: Mood depressed // tearful at times  Data Reviewed:  Labs reviewed: Na 138, K 4.0, Cr 1.02   Family Communication: Pt in room, family at bedside  Disposition: Status is: Inpatient Remains inpatient appropriate because: Severity of illness  Planned Discharge Destination:  Inpatient Psychiatry    Author: Marylu Lund, MD 05/21/2022 3:37 PM  For on call review [email or web address].

## 2022-05-21 NOTE — Progress Notes (Signed)
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  Assessment: Patient with PMH of Crohn's disease and complex surgical history as above on chronic TPN who presented on 05/13/22 with altered mental status after spouse found patient unresponsive with multiple pill bottles open around him. Patient was transported from SNF to ED the day prior for generalized weakness and acute worsening of his chronic abdominal pain. Patient decided to return home instead of his SNF. Pharmacy consulted to resume TPN as inpatient.   Discharge from [facility] 04/19/22:  TPN pharmacist at [facility]:  Estimated Nutrient Requirements: Protein: 1.5 - 1.7 g/kg/day (98 - 104 g/day), Total kcal: 30 - 35 kcal/kg/day (1830 - 1983 kcal/day)  TPN Formulation at [facility] per TPN pharmacist: [A 6.6%, D 20%, F 3.1%] @ goal rate, 65 mL/hr (TV: 1560 mL/day) provides: Protein 1.7 g/kg/day 103 g/day, Total kcal 32 kcal/kg/day 1971 kcal/day  NPC: N ratio 94   Admitted at [facility] 04/29/22-05/03/22: Nutritional Goals: TPN 80 mL/hr (provides 113 g of protein and 1943 kcals per day) RD Assessment: (10/16): 2100-2450kcal (30-35kcal/kg), Protein 105-130g (1.5-1.8g/kg) Utilize intralipid rather than SMOF as 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  [facility]: [name], PharmD, ph: [phone] 10/28 Most recent formula: Total volume 1560 mL:  Dextrose 220 g/day, Plenamine 120 g/day, SMOFlipid 50 g/day, NaCl 75 mEq, CaGluc 6 mEq, Potassium Ac 20 mEq, Potassium Phos 13.5 nM, Mag Sulfate 8 mEq, Vitamins/trace  elements  Glucose / Insulin: No hx DM, Glucose remains < 180 - SSI and CBG checks have been discontinued. Electrolytes:  WNL.  K, Phos, and Mag stabilized. CO2 decreased.  Renal: SCr decreased to ~1, BUN increased to 33; good UOP Hepatic: small uptrend in LFTs. Bili, TG stable WNL; albumin remains low and decreasing (last on 11/2) I/O: ileostomy output inc to 845 mL.  UOP increased to 2050 mL.  Overall, net -952m GI Imaging: - 10/27 CTa/p: no significant interval change since 10/14 CT. No evidence of bowel obstruction or other complicating feature at the anastomotic site or ostomy. - 10/14 CTa/p: previous surgical changes noted (transverse-sigmoid anastomosis) with stable inflammation since 10/12 CT GI Surgeries / Procedures: n/a  Central access: prior to admission - double lumen CVC TPN start date: started PTA  Nutritional Goals: TPN goal rate is 85 mL/hr and provides 122 g of protein and 2018 kcals per day.  RD Assessment this admission:  11/2 - Continues on TPN at higher calorie provision to prevent further weight loss.  Estimated Needs Total Energy Estimated Needs: 2015-2350kcal (30-35kcal/kg) Total Protein Estimated Needs: 100-135 (1.5-2.0g/kg) Total Fluid Estimated Needs: 1700-2102m Current Nutrition:  NPO - 11/3 Did not pass SLP eval, continue NPO at this time with no PO meds. - Ultimately, would consider MBS when abld to tolerate small amount of barium  Plan:  Continue TPN at 85 mL/hr Electrolytes in TPN:  Na 45 mEq/L K 40 mEq/L Ca 5 mEq/L Mg 2.5 mEq/L Phos 15 mmol/L Cl:Ac = max Ac Add standard MVI and trace elements to TPN Add pepcid to TPN  Monitor TPN labs on Mon/Thurs at minimum  Gretta Arab PharmD, BCPS [facility] main pharmacy [phone] 05/21/2022 8:35 AM

## 2022-05-22 ENCOUNTER — Other Ambulatory Visit: Payer: Self-pay | Admitting: Pharmacy Technician

## 2022-05-22 ENCOUNTER — Inpatient Hospital Stay (HOSPITAL_COMMUNITY): Payer: Medicare Other

## 2022-05-22 DIAGNOSIS — R401 Stupor: Secondary | ICD-10-CM | POA: Diagnosis not present

## 2022-05-22 DIAGNOSIS — D649 Anemia, unspecified: Secondary | ICD-10-CM | POA: Diagnosis not present

## 2022-05-22 DIAGNOSIS — G9341 Metabolic encephalopathy: Secondary | ICD-10-CM | POA: Diagnosis not present

## 2022-05-22 DIAGNOSIS — K50918 Crohn's disease, unspecified, with other complication: Secondary | ICD-10-CM | POA: Diagnosis not present

## 2022-05-22 LAB — COMPREHENSIVE METABOLIC PANEL
ALT: 135 U/L — ABNORMAL HIGH (ref 0–44)
AST: 103 U/L — ABNORMAL HIGH (ref 15–41)
Albumin: 2.4 g/dL — ABNORMAL LOW (ref 3.5–5.0)
Alkaline Phosphatase: 169 U/L — ABNORMAL HIGH (ref 38–126)
Anion gap: 5 (ref 5–15)
BUN: 26 mg/dL — ABNORMAL HIGH (ref 8–23)
CO2: 23 mmol/L (ref 22–32)
Calcium: 8.2 mg/dL — ABNORMAL LOW (ref 8.9–10.3)
Chloride: 108 mmol/L (ref 98–111)
Creatinine, Ser: 0.77 mg/dL (ref 0.61–1.24)
GFR, Estimated: >60 mL/min (ref >60)
Glucose, Bld: 96 mg/dL (ref 70–99)
Potassium: 3.8 mmol/L (ref 3.5–5.1)
Sodium: 136 mmol/L (ref 135–145)
Total Bilirubin: 0.5 mg/dL (ref 0.3–1.2)
Total Protein: 6 g/dL — ABNORMAL LOW (ref 6.5–8.1)

## 2022-05-22 LAB — GLUCOSE, CAPILLARY
Glucose-Capillary: 99 mg/dL (ref 70–99)
Glucose-Capillary: 107 mg/dL — ABNORMAL HIGH (ref 70–99)
Glucose-Capillary: 109 mg/dL — ABNORMAL HIGH (ref 70–99)

## 2022-05-22 LAB — TRIGLYCERIDES: Triglycerides: 116 mg/dL (ref <150)

## 2022-05-22 LAB — MAGNESIUM: Magnesium: 2 mg/dL (ref 1.7–2.4)

## 2022-05-22 LAB — PHOSPHORUS: Phosphorus: 2.6 mg/dL (ref 2.5–4.6)

## 2022-05-22 MED ORDER — FLUOXETINE HCL 20 MG PO CAPS: 20.0000 mg | ORAL_CAPSULE | Freq: Every day | ORAL | Status: DC

## 2022-05-22 MED ORDER — INSULIN ASPART 100 UNIT/ML IJ SOLN
0.0000 [IU] | INTRAMUSCULAR | Status: DC
  Administered 2022-05-24: 1 [IU] via SUBCUTANEOUS

## 2022-05-22 MED ORDER — TRAVASOL 10 % IV SOLN
INTRAVENOUS | Status: AC
  Filled 2022-05-22: qty 1224

## 2022-05-22 MED ORDER — FLUOXETINE HCL 20 MG/5ML PO SOLN
20.0000 mg | Freq: Every day | ORAL | Status: DC
  Administered 2022-05-23: 20 mg via ORAL
  Filled 2022-05-22: qty 5

## 2022-05-22 NOTE — Plan of Care (Signed)
  Problem: Education: Goal: Knowledge of General Education information will improve Description: Including pain rating scale, medication(s)/side effects and non-pharmacologic comfort measures Outcome: Progressing   Problem: Health Behavior/Discharge Planning: Goal: Ability to manage health-related needs will improve Outcome: Progressing   Problem: Clinical Measurements: Goal: Ability to maintain clinical measurements within normal limits will improve Outcome: Progressing   Problem: Activity: Goal: Risk for activity intolerance will decrease Outcome: Progressing   Problem: Nutrition: Goal: Adequate nutrition will be maintained Outcome: Progressing   Problem: Elimination: Goal: Will not experience complications related to bowel motility Outcome: Progressing   Problem: Pain Managment: Goal: General experience of comfort will improve Outcome: Progressing   Problem: Safety: Goal: Ability to remain free from injury will improve Outcome: Progressing   Problem: Skin Integrity: Goal: Risk for impaired skin integrity will decrease Outcome: Progressing

## 2022-05-22 NOTE — Progress Notes (Signed)
Physical Therapy Treatment Patient Details Name: [name] MRN: [id number] DOB: 03-27-1951 Today's Date: 05/22/2022   History of Present Illness 71 year old male with complicated past medical history as outlined below, which is significant for Crohn's disease with persistent abdominal pain.  On April of this year he underwent exploratory laparotomy with small bowel resection, repair of enterotomy, and creation of a diverting loop ileostomy at [facility].  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  He has been residing in [facility] SNF from where he presented to the emergency department on 10/27 for pain at the site of his ostomy with foul-smelling discharge.  Work-up of the ostomy and abdomen including CT imaging was unremarkable.  He was discharged to SNF, however, rather than returning to SNF he found private transport to his home.Then on 10/28 he again presented to [facility] emergency department with chief complaint of unresponsiveness.11/2 reveals that ingestion of pills at home was in fact a suicide attempt. 1:1 sitter, psych consult    PT Comments    General Comments: AxO x 3 very quiet, soft spoken, cooperative and "worried" about "what's to come". Assisted OOB to amb was very difficult.  General bed mobility comments: assist for trunk upright and LEs over EOB used bed pad to complete scooting.  General transfer comment: + 2 side by side Mod Assist sit to stand.  Pt nervous.  Max encouragement to increase activity.  Mild c/o dizziness.  Feeling "really weak". General Gait Details: VERY limited amb distance due to profound weakness.  Third asisst following with recliner.  Pt present with mild anxiety/fear of falling/fear of inability.  BP 110/74  HR 84 RA 100%. Pt will need ST Rehab at SNF to address mobility and functional decline prior to safely returning home.   Recommendations for follow up therapy are one component of a multi-disciplinary discharge planning process, led by the  attending physician.  Recommendations may be updated based on patient status, additional functional criteria and insurance authorization.  Follow Up Recommendations  Skilled nursing-short term rehab (<3 hours/day) Can patient physically be transported by private vehicle: No   Assistance Recommended at Discharge Frequent or constant Supervision/Assistance  Patient can return home with the following A little help with bathing/dressing/bathroom;Assist for transportation;Help with stairs or ramp for entrance;A lot of help with walking and/or transfers   Equipment Recommendations       Recommendations for Other Services       Precautions / Restrictions Precautions Precaution Comments: right colostomy Restrictions Weight Bearing Restrictions: No     Mobility  Bed Mobility Overal bed mobility: Needs Assistance Bed Mobility: Supine to Sit     Supine to sit: Max assist     General bed mobility comments: assist for trunk upright and LEs over EOB used bed pad to complete scooting    Transfers Overall transfer level: Needs assistance Equipment used: Rolling walker (2 wheels) Transfers: Sit to/from Stand Sit to Stand: Mod assist, +2 physical assistance           General transfer comment: + 2 side by side Mod Assist sit to stand.  Pt nervous.  Max encouragement to increase activity.  Mild c/o dizziness.  Feeling "really weak".    Ambulation/Gait Ambulation/Gait assistance: Mod assist, +2 physical assistance, +2 safety/equipment Gait Distance (Feet): 5 Feet Assistive device: Rolling walker (2 wheels) Gait Pattern/deviations: Step-through pattern, Trunk flexed, Decreased stride length Gait velocity: decreased     General Gait Details: VERY limited amb distance due to profound weakness.  Third  asisst following with recliner.  Pt present with mild anxiety/fear of falling/fear of inability.  BP 110/74  HR 84 RA 100%.   Stairs             Wheelchair Mobility     Modified Rankin (Stroke Patients Only)       Balance                                            Cognition Arousal/Alertness: Awake/alert Behavior During Therapy: WFL for tasks assessed/performed Overall Cognitive Status: Within Functional Limits for tasks assessed                                 General Comments: AxO x 3 very quiet, soft spoken, cooperative and "worried" about "what's to come".        Exercises      General Comments        Pertinent Vitals/Pain Pain Assessment Pain Assessment: Faces Faces Pain Scale: Hurts even more Pain Location: ABD Pain Descriptors / Indicators: Discomfort, Grimacing Pain Intervention(s): Monitored during session    Home Living                          Prior Function            PT Goals (current goals can now be found in the care plan section) Progress towards PT goals: Progressing toward goals    Frequency    Min 2X/week      PT Plan Discharge plan needs to be updated    Co-evaluation              AM-PAC PT "6 Clicks" Mobility   Outcome Measure  Help needed turning from your back to your side while in a flat bed without using bedrails?: A Lot Help needed moving from lying on your back to sitting on the side of a flat bed without using bedrails?: A Lot Help needed moving to and from a bed to a chair (including a wheelchair)?: A Lot Help needed standing up from a chair using your arms (e.g., wheelchair or bedside chair)?: A Lot Help needed to walk in hospital room?: A Lot Help needed climbing 3-5 steps with a railing? : Total 6 Click Score: 11    End of Session Equipment Utilized During Treatment: Gait belt Activity Tolerance: Patient limited by fatigue Patient left: in chair;with call bell/phone within reach;with family/visitor present;with nursing/sitter in room Nurse Communication: Mobility status;Need for lift equipment PT Visit Diagnosis: Difficulty in  walking, not elsewhere classified (R26.2)     Time: 3845-3646 PT Time Calculation (min) (ACUTE ONLY): 18 min  Charges:  $Gait Training: 8-22 mins                     Rica Koyanagi  PTA [place] Office M-F          [phone] Weekend pager [phone]

## 2022-05-22 NOTE — TOC Progression Note (Addendum)
Transition of Care [facility]) - Progression Note    Patient Details  Name: [name] MRN: [id number] Date of Birth: May 10, 1951  Transition of [facility] CM/SW Contact  Lennart Pall, LCSW Phone Number: 05/22/2022, 3:15 PM  Clinical Narrative:    Continue to conduct bed search for inpatient geropsychiatric bed at facility that can manage pt's current functional care needs and TPN management.  Have updated pt, wife and son.   Expected Discharge Plan:  (TBD) Barriers to Discharge: Continued Medical Work up  Expected Discharge Plan and Services Expected Discharge Plan:  (TBD)   Discharge Planning Services: CM Consult Post Acute Care Choice: [place] Living arrangements for the past 2 months: Single Family Home                                       Social Determinants of Health (SDOH) Interventions    Readmission Risk Interventions    05/15/2022   10:53 AM  Readmission Risk Prevention Plan  Transportation Screening Complete  Medication Review (RN Care Manager) Complete  PCP or Specialist appointment within 3-5 days of discharge Complete  HRI or Home Care Consult Complete  SW Recovery Care/Counseling Consult Complete  [place] Complete

## 2022-05-22 NOTE — Care Management Important Message (Signed)
Important Message  Patient Details IM Letter placed in Patients room. Name: [name] MRN: [id number] Date of Birth: Dec 18, 1950   Medicare Important Message Given:  Yes     Kerin Salen 05/22/2022, 3:09 PM

## 2022-05-22 NOTE — Progress Notes (Signed)
PHARMACY - TOTAL PARENTERAL NUTRITION CONSULT NOTE   Indication: Crohn's disease and complex past surgical history including ex lap with small bowel resection, lysis of adhesion, mesh explantation, and diverting loop ileostomy in April 2023, on TPN prior to admission  Patient Measurements: Height: 5' 10" (177.8 cm) Weight: 64.7 kg (142 lb 10.2 oz) IBW/kg (Calculated) : 73   Body mass index is 20.47 kg/m.  Assessment: Patient with PMH of Crohn's disease and complex surgical history as above on chronic TPN who presented on 05/13/22 with altered mental status after spouse found patient unresponsive with multiple pill bottles open around him. Patient was transported from SNF to ED the day prior for generalized weakness and acute worsening of his chronic abdominal pain. Patient decided to return home instead of his SNF. Pharmacy consulted to resume TPN as inpatient.   Discharge from [facility] 04/19/22:  TPN pharmacist at [place]:  Estimated Nutrient Requirements: Protein: 1.5 - 1.7 g/kg/day (98 - 104 g/day), Total kcal: 30 - 35 kcal/kg/day (1830 - 1983 kcal/day)  TPN Formulation at [place] per TPN pharmacist: [A 6.6%, D 20%, F 3.1%] @ goal rate, 65 mL/hr (TV: 1560 mL/day) provides: Protein 1.7 g/kg/day 103 g/day, Total kcal 32 kcal/kg/day 1971 kcal/day  NPC: N ratio 94   Admitted at [place] 04/29/22-05/03/22: Nutritional Goals: TPN 80 mL/hr (provides 113 g of protein and 1943 kcals per day) RD Assessment: (10/16): 2100-2450kcal (30-35kcal/kg), Protein 105-130g (1.5-1.8g/kg) Utilize intralipid rather than SMOF as 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  [facility]: [name], PharmD, ph: [phone] 10/28 Most recent formula: Total volume 1560 mL:  Dextrose 220 g/day, Plenamine 120 g/day, SMOFlipid 50 g/day, NaCl 75 mEq, CaGluc 6 mEq, Potassium Ac 20 mEq, Potassium Phos 13.5 nM, Mag Sulfate 8 mEq, Vitamins/trace  elements  Glucose / Insulin: No hx DM, Glucose remains < 180 - SSI and CBG checks have been discontinued. Electrolytes:  WNL. CO2 21> 23 w/ change to max acetate 11/5  Renal: SCr WNL.  BUN 26; good UOP Hepatic:  AST/ALT/Alk phos all increased and elevated. TBili WNL, TG stable WNL; albumin remains low (2.4 11/6)  I/O: ileostomy output 575 mL.  UOP increased to 2575 mL.  Overall, net -1632 ml  IVF LR @ 75 ml/hr GI Imaging: - 10/27 CTa/p: no significant interval change since 10/14 CT. No evidence of bowel obstruction or other complicating feature at the anastomotic site or ostomy. - 10/14 CTa/p: previous surgical changes noted (transverse-sigmoid anastomosis) with stable inflammation since 10/12 CT GI Surgeries / Procedures: n/a  Central access: prior to admission - double lumen CVC TPN start date: started PTA  Nutritional Goals: TPN goal rate is 85 mL/hr and provides 122 g of protein and 2018 kcals per day.  RD Assessment this admission:  11/2 - Continues on TPN at higher calorie provision to prevent further weight loss.  Estimated Needs Total Energy Estimated Needs: 2015-2350kcal (30-35kcal/kg) Total Protein Estimated Needs: 100-135 (1.5-2.0g/kg) Total Fluid Estimated Needs: 1700-2141m  Current Nutrition:  NPO - 11/3 Did not pass SLP eval, continue NPO at this time with no PO meds. - Ultimately, would consider MBS when abld to tolerate small amount of barium  Plan:  Change TPN from 85 mL/hr to 18 hour cycle and assess tolerance Will not be able to advance cycle to 12 hrs until IVF rate is lower.  Max fluid rate (TPN + MIVF) usually ~ 200 ml/hr CBGs to evaluate cyclic TPN: CBG  2 hrs past cyclic TPN start 73:22: 1 hour past TPN DC 13:00; middle of TPN 06:00  Electrolytes in TPN:  Na 45 mEq/L K 40 mEq/L Ca 5 mEq/L Mg 2.5 mEq/L Phos 15 mmol/L Cl:Ac = max Ac Add standard MVI and trace elements to TPN continue pepcid to TPN  IVF LR @ 75 ml/hr per MD Monitor TPN labs on  Mon/Thurs at minimum   Eudelia Bunch, Pharm.D Use secure chat for questions 05/22/2022 9:37 AM

## 2022-05-22 NOTE — Consult Note (Cosign Needed Addendum)
Face-to-Face Psychiatry Consult   Reason for Consult:  suicide attempt  Referring Physician:  Red Christians   Patient Identification: [name] MRN:  [id number] Principal Diagnosis: Obtundation Diagnosis:  Principal Problem:   Obtundation Active Problems:   Metabolic encephalopathy   Chronic anemia   Acute respiratory failure with hypoxia (HCC)   Severe malnutrition ([place])   Total Time spent with patient: 20 minutes  Subjective:   71yo with hx Crohn's with persistent abd pain s/p extensive surgeries and is currently TPN dependent. Pt presented with unresponsiveness in apparent suicide attempt via overdose. Pt remained unresponsive and later was unable to maintain airway, prompting intubation and transfer to ICU. Pt now extubated and care transferred to hospitalist.   HPI: Patient seen and reassessed by the psychiatric nurse practitioner.  Patient was initially working with physical therapy, and appeared to be in good spirits.  Patient presented today with much brighter affect to include saying" oh Lord" and smiling when this nurse practitioner into the room.  Patient reports overall having a good weekend.  States he started a new medication, has no current complaints about it with the exception of the taste.  He reports the taste is disgusting, and inquires about changing to appeal.  We did review and briefly go over his speech and language therapy today, which did improve him with clear liquids with a spoon due to aspiration on today's evaluation.  Patient verbalizes he is at risk for aspirating, however willing to try p.o. and or capsule to avoid liquid fluoxetine.   In regards to his mood, anxiety, and suicidality they continue to remain present.  Although he does remain hopeful that things will get better.  Patient does report having a very vivid dream about building a bomb at his son's house and [facility].  He reports he was very fearful after waking up, even after he knew it was a  dream.  Psychiatric consult service will continue to monitor at this time.  Pt remains at severe risk of suicide without inpatient psychiatric admission and treatment.     Past Psychiatric History: Depression  Risk to Self:   yes suicide attempt Risk to Others:   Denies Prior Inpatient Therapy:   Denies Prior Outpatient Therapy:   Denies  Past Medical History:  Past Medical History:  Diagnosis Date   Acute prostatitis 07/24/2007   Qualifier: Diagnosis of  By: [name] MD, [name]    Allergy    mild   Arthritis    osteoarthritis   Asthma    Blood transfusion without reported diagnosis    BPH (benign prostatic hypertrophy) with urinary obstruction    Crohn's ileitis ([place]) suspected 05/03/2017   Dilated aortic root ([place])    noted on echo 08/2012   Diverticulitis of colon    EPIDIDYMITIS 02/15/2010   Qualifier: Diagnosis of  By: [name] MD, [name]    GERD (gastroesophageal reflux disease)    H/O: GI bleed    Hemorrhoids    Hepatitis 1975   unknown type    HERPES SIMPLEX INFECTION 10/14/2007   Qualifier: Diagnosis of  By: [name] MD, [name]    Hiatal hernia    Ileus following gastrointestinal surgery ([place]) 12/26/2011   Long term (current) use of systemic steroids 06/18/2018   Psoriasis    sees [name] at [facility]   Recurrent ventral incisional hernia 05/10/2012   SVT (supraventricular tachycardia)    Ulcer 08/21/2013   ileal    Past Surgical History:  Procedure Laterality Date   BOWEL RESECTION  12/19/2011   Procedure: SMALL BOWEL RESECTION;  Surgeon: [name], MD;  Location: [facility];  Service: General;  Laterality: N/A;  with anastamosis and insertion mesh   BRONCHOSCOPY     COLON SURGERY  01/2004   x 2   COLONOSCOPY W/ BIOPSIES  04/26/2017   per [name], no polyps, benign inflammation, repeat in 5 yrs    CYSTOSCOPY     ESOPHAGOGASTRODUODENOSCOPY     HEMICOLECTOMY     left side, at [facility], diverticulitis   [place]      [phone] incisional hernia   ILEOSTOMY     ILEOSTOMY CLOSURE     INSERTION OF MESH  07/31/2012   Procedure: INSERTION OF MESH;  Surgeon: [name], MD;  Location: [facility];  Service: General;;   LAPAROTOMY  12/19/2011   Procedure: EXPLORATORY LAPAROTOMY;  Surgeon: [name], MD;  Location: [facility];  Service: General;  Laterality: N/A;   PACEMAKER IMPLANT N/A 12/01/2020   Procedure: PACEMAKER IMPLANT;  Surgeon: [name], MD;  Location: [place] [facility];  Service: Cardiovascular;  Laterality: N/A;   PACEMAKER INSERTION Left    TONSILLECTOMY     UPPER GASTROINTESTINAL ENDOSCOPY     VASECTOMY     VENTRAL HERNIA REPAIR  07/31/2012   Procedure: HERNIA REPAIR VENTRAL ADULT;  Surgeon: [name], MD;  Location: [facility];  Service: General;  Laterality: N/A;   Family History:  Family History  Problem Relation Age of Onset   Lung cancer Mother    Leukemia Father    Hypertension Father    Heart disease Father    Heart attack Father    Prostate cancer Father    Prostate cancer Paternal Uncle    Colon cancer Neg Hx    Stomach cancer Neg Hx    Colon polyps Neg Hx    Esophageal cancer Neg Hx    Rectal cancer Neg Hx    Family Psychiatric  History: none reported  Social History:  Social History   Substance and Sexual Activity  Alcohol Use Not Currently   Comment: occ     Social History   Substance and Sexual Activity  Drug Use Yes   Types: Oxycodone    Social History   Socioeconomic History   Marital status: Married    Spouse name: Not on file   Number of children: 2   Years of education: Not on file   Highest education level: Not on file  Occupational History   Occupation: EHS Freight forwarder    Employer: [employer] education [employer]  Tobacco Use   Smoking status: Never   Smokeless tobacco: Never  Vaping Use   Vaping Use: Never used  Substance and Sexual Activity   Alcohol use: Not Currently    Comment: occ   Drug use: Yes    Types: Oxycodone    Sexual activity: Not on file  Other Topics Concern   Not on file  Social History Narrative   He is married with 2 sons 1 son is an Art gallery manager and 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   He is Nurse, 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   Rare if any caffeine   Rare alcohol and never smoker   Social Determinants of Radio broadcast assistant Strain: Low Risk  (  04/10/2022)   Overall Financial Resource Strain (CARDIA)    Difficulty of Paying Living Expenses: Not hard at all  Food Insecurity: No Food Insecurity (05/14/2022)   Hunger Vital Sign    Worried About Running Out of Food in the Last Year: Never true    Ran Out of Food in the Last Year: Never true  Transportation Needs: No Transportation Needs (05/14/2022)   PRAPARE - Hydrologist (Medical): No    Lack of Transportation (Non-Medical): No  Physical Activity: Inactive (04/10/2022)   Exercise Vital Sign    Days of Exercise per Week: 0 days    Minutes of Exercise per Session: 0 min  Stress: Stress Concern Present (04/10/2022)   [place]    Feeling of Stress : To some extent  Social Connections: Not on file   Additional Social History:    Allergies:   Allergies  Allergen Reactions   Purinethol [Mercaptopurine] Other (See Comments)    Pancreatitis    Shellfish Allergy Anaphylaxis and Swelling    Can use standard SMOF lipid formulation for TPN without any issue.    Humira [Adalimumab] Other (See Comments)    Developed antibodies   Tape Rash   Wound Dressing Adhesive Rash    Labs:  Results for orders placed or performed during the hospital encounter of 05/13/22 (from the past 48 hour(s))  Basic metabolic panel     Status: Abnormal   Collection Time: 05/21/22  2:57 AM  Result Value Ref Range   Sodium 138 135 -  145 mmol/L   Potassium 4.0 3.5 - 5.1 mmol/L   Chloride 110 98 - 111 mmol/L   CO2 21 (L) 22 - 32 mmol/L   Glucose, Bld 91 70 - 99 mg/dL    Comment: Glucose reference range applies only to samples taken after fasting for at least 8 hours.   BUN 33 (H) 8 - 23 mg/dL   Creatinine, Ser 1.02 0.61 - 1.24 mg/dL   Calcium 8.3 (L) 8.9 - 10.3 mg/dL   GFR, Estimated >60 >60 mL/min    Comment: (NOTE) Calculated using the CKD-EPI Creatinine Equation (2021)    Anion gap 7 5 - 15    Comment: Performed at [facility], [place] [address] [place], [place] [address]  Magnesium     Status: None   Collection Time: 05/21/22  2:57 AM  Result Value Ref Range   Magnesium 2.3 1.7 - 2.4 mg/dL    Comment: Performed at [facility], [place] [address], [place], [place] [address]  Phosphorus     Status: None   Collection Time: 05/21/22  2:57 AM  Result Value Ref Range   Phosphorus 3.3 2.5 - 4.6 mg/dL    Comment: Performed at [facility], [place] [address] [place], [place] [address]  Comprehensive metabolic panel     Status: Abnormal   Collection Time: 05/22/22  2:57 AM  Result Value Ref Range   Sodium 136 135 - 145 mmol/L   Potassium 3.8 3.5 - 5.1 mmol/L   Chloride 108 98 - 111 mmol/L   CO2 23 22 - 32 mmol/L   Glucose, Bld 96 70 - 99 mg/dL    Comment: Glucose reference range applies only to samples taken after fasting for at least 8 hours.   BUN 26 (H) 8 - 23 mg/dL   Creatinine, Ser 0.77 0.61 - 1.24 mg/dL   Calcium 8.2 (L) 8.9 -  10.3 mg/dL   Total Protein 6.0 (L) 6.5 - 8.1 g/dL   Albumin 2.4 (L) 3.5 - 5.0 g/dL   AST 103 (H) 15 - 41 U/L   ALT 135 (H) 0 - 44 U/L   Alkaline Phosphatase 169 (H) 38 - 126 U/L   Total Bilirubin 0.5 0.3 - 1.2 mg/dL   GFR, Estimated >60 >60 mL/min    Comment: (NOTE) Calculated using the CKD-EPI Creatinine Equation (2021)    Anion gap 5 5 - 15    Comment: Performed at [facility], [place] [address],  [place], [place] [address]  Magnesium     Status: None   Collection Time: 05/22/22  2:57 AM  Result Value Ref Range   Magnesium 2.0 1.7 - 2.4 mg/dL    Comment: Performed at [facility], [place] [address] [place], [place] [address]  Phosphorus     Status: None   Collection Time: 05/22/22  2:57 AM  Result Value Ref Range   Phosphorus 2.6 2.5 - 4.6 mg/dL    Comment: Performed at [facility], [place] [address], [place], [place] [address]  Triglycerides     Status: None   Collection Time: 05/22/22  2:57 AM  Result Value Ref Range   Triglycerides 116 <150 mg/dL    Comment: Performed at [facility], [place] [address], [place], [place] [address]  Glucose, capillary     Status: None   Collection Time: 05/22/22 10:42 AM  Result Value Ref Range   Glucose-Capillary 99 70 - 99 mg/dL    Comment: Glucose reference range applies only to samples taken after fasting for at least 8 hours.  Glucose, capillary     Status: Abnormal   Collection Time: 05/22/22  2:13 PM  Result Value Ref Range   Glucose-Capillary 107 (H) 70 - 99 mg/dL    Comment: Glucose reference range applies only to samples taken after fasting for at least 8 hours.    Current Facility-Administered Medications  Medication Dose Route Frequency Provider Last Rate Last Admin   acetaminophen (TYLENOL) suppository 325 mg  325 mg Rectal Q4H PRN Cherene Altes, MD   325 mg at 05/22/22 1443   Chlorhexidine Gluconate Cloth 2 % PADS 6 each  6 each Topical Daily Cherene Altes, MD   6 each at 05/22/22 1037   diphenhydrAMINE (BENADRYL) injection 12.5 mg  12.5 mg Intravenous QHS PRN Donne Hazel, MD   12.5 mg at 05/21/22 0046   enoxaparin (LOVENOX) injection 40 mg  40 mg Subcutaneous Q24H Joette Catching T, MD   40 mg at 05/21/22 2018   [START ON 05/23/2022] FLUoxetine (PROZAC) capsule 20 mg  20 mg Oral Daily Suella Broad, FNP       HYDROmorphone (DILAUDID) injection 0.5 mg  0.5 mg  Intravenous Q6H PRN Loistine Chance, MD   0.5 mg at 05/22/22 1030   insulin aspart (novoLOG) injection 0-9 Units  0-9 Units Subcutaneous 3 times per day Leodis Sias T, RPH       lactated ringers infusion   Intravenous Continuous Donne Hazel, MD 75 mL/hr at 05/22/22 1049 Infusion Verify at 05/22/22 1049   lidocaine (LIDODERM) 5 % 1 patch  1 patch Transdermal Q24H Cristal Generous, NP   1 patch at 05/21/22 1653   ondansetron (ZOFRAN) tablet 4 mg  4 mg Oral Q6H PRN Cherene Altes, MD       Or   ondansetron [facility]) injection 4 mg  4 mg Intravenous Q6H PRN Cherene Altes, MD   4 mg at 05/21/22 0047   Oral care mouth rinse  15 mL Mouth Rinse PRN Candee Furbish, MD       sodium chloride flush (NS) 0.9 % injection 10-40 mL  10-40 mL Intracatheter PRN Cherene Altes, MD   10 mL at 05/17/22 1733   TPN ADULT (ION)   Intravenous Continuous TPN Randa Spike, RPH 85 mL/hr at 05/22/22 1049 Infusion Verify at 09/98/33 8250   TPN CYCLIC-ADULT (ION)   Intravenous Cyclic-See Admin Instructions Eudelia Bunch, [facility]        Musculoskeletal: Strength & Muscle Tone: Laying in bed   Gait & Station: Laying in bed   Patient leans: Laying in bed              Psychiatric Specialty Exam: pt asleep - could not assess today.   Physical Exam: Physical Exam Vitals reviewed.  Constitutional:      Appearance: Normal appearance. He is normal weight.  Neurological:     General: No focal deficit present.     Mental Status: He is alert and oriented to person, place, and time. Mental status is at baseline.  Psychiatric:        Attention and Perception: Attention and perception normal.        Mood and Affect: Affect normal. Mood is anxious and depressed.        Speech: Speech normal.        Behavior: Behavior normal. Behavior is cooperative.        Thought Content: Thought content normal.        Cognition and Memory: Cognition normal.        Judgment: Judgment normal.    Review of  Systems  Psychiatric/Behavioral:  Positive for depression and suicidal ideas. Negative for hallucinations and substance abuse. The patient is nervous/anxious. The patient does not have insomnia.   All other systems reviewed and are negative.  pt asleep unable to assess  Blood pressure 128/66, pulse 78, temperature 98 F (36.7 C), temperature source Oral, resp. rate 16, height 5' 10"  (1.778 m), weight 64.7 kg, SpO2 96 %. Body mass index is 20.47 kg/m.  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 daily.  Attempted to switch from liquid to capsule, capsule can't not be manipulated or opened.  No flavoring agents available in pharmacy.   [email or web address]   -Pt can have access to his cell phone when family is present.  Disposition: Recommend psychiatric Inpatient admission when medically cleared.  Suella Broad, FNP 05/22/2022 3:11 PM

## 2022-05-22 NOTE — Progress Notes (Signed)
Speech Language Pathology Treatment: Dysphagia  Patient Details Name: [name] MRN: [id number] DOB: October 29, 1950 Today's Date: 05/22/2022 Time: 0810-0825 SLP Time Calculation (min) (ACUTE ONLY): 15 min  Assessment / Plan / Recommendation Clinical Impression  Patient seen by SLP for skilled treatment focused on dysphagia goals. Patient was awake and alert, cooperative but very weak still. SLP assessed patient's toleration of a single cup sip of thin liquids (water) which led to immediate and prolonged coughing. SLP recommending to proceed with MBS to objectively assess swallow function prior to making any PO recommendations.   HPI HPI: Pt is a 71 yo male admit 10/28 after being obtunded found down 10/30 - ? polypharmacy,  tx to ICU for intubation, 10/31 waking up and following commands overnight.  He was extubated 11/1 and failed swallow screen thus SLP eval was ordered.  He also has h/o Crohns disease - complex hx including small bowel perf -surgical history, short gut- TPN.  MRI showed No acute intracranial abnormality.  2. 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  Chest imaging concerning for right infrahilar airspace consolidation concerning  for pneumonia or aspiration.      SLP Plan  MBS;Continue with current plan of care      Recommendations for follow up therapy are one component of a multi-disciplinary discharge planning process, led by the attending physician.  Recommendations may be updated based on patient status, additional functional criteria and insurance authorization.    Recommendations  Diet recommendations: NPO Medication Administration: Via alternative means                Oral Care Recommendations: Oral care prior to ice chip/H20 Follow Up Recommendations: SLP at Long-term acute care hospital Assistance recommended at discharge: Frequent or constant Supervision/Assistance SLP Visit  Diagnosis: Dysphagia, unspecified (R13.10) Plan: MBS;Continue with current plan of care           Sonia Baller, MA, CCC-SLP Speech Therapy

## 2022-05-22 NOTE — Progress Notes (Signed)
  Progress Note   Patient: [name]:[id number] DOB: 09-06-1950 DOA: 05/13/2022     9 DOS: the patient was seen and examined on 05/22/2022   Brief hospital course: 71yo with hx Crohn's with persistent abd pain s/p extensive surgeries and is currently TPN dependent. Pt presented with unresponsiveness in apparent suicide attempt via overdose. Pt remained unresponsive and later was unable to maintain airway, prompting intubation and transfer to ICU. Pt now extubated and care transferred to hospitalist.  Assessment and Plan: Intentional overdose, depression -Psychiatry following. Recs for inpatient Psych -Currently continued on prozac 3m nightly -Sitter at bedside  Toxic metabolic encephalopathy -Appropriately conversant -Suspect mental status change related to intentional medication overdose -Home meds were on hold per PCCM -Recent CT head and MRI brain reviewed, neg for acute pathology -Mentation seems much improved and pt speaking more clearly  Aspiration PNA -cont on unasyn -Initially did not pass SLP eval -On 11/6, pt underwent MBS and is now able to have clears with   Chron's disease -s/p multiple surgeries in past -Currently TPN dependent  Severe protein calorie malnutrition -On TPN -Recheck cmp in  AM -Membranes appear somewhat dry. Pt reports hx dehydration in past even when eating with TPN -Continued on basal IVF  Chronic pain -Was on morphine PTA -Now on toradol PRN -Palliative Care consulted to assist with symptom management in light of hx uncontrolled pain -Appreciate assistance by Palliative Care. Now on PRN dilaudid  Afib -Rate controlled at this time -Noted to be transient event while in ICU that quickly resolved  Chronic prostatitis -Continued on Unasyn at this time -Clears started today. If pt demonstrates being able to reliably tolerate PO, would resume suppressive bactrim and oxybutynin   Bladder calculi -Recommend f/u with Urology as  outpatient -Renal function unremarkable  Anemia -Stable -No evidence of acute blood loss      Subjective: Reports feeling better today. [facility]am: Vitals:   05/21/22 2306 05/22/22 0612 05/22/22 0954 05/22/22 1649  BP: 120/68 130/67 128/66 129/86  Pulse: 75 77 78 74  Resp: 18 14 16 17   Temp: 97.6 F (36.4 C) (!) 97.5 F (36.4 C) 98 F (36.7 C) 97.9 F (36.6 C)  TempSrc: Oral  Oral Oral  SpO2: 98% 97% 96% 97%  Weight:      Height:       General exam: Conversant, in no acute distress Respiratory system: normal chest rise, clear, no audible wheezing Cardiovascular system: regular rhythm, s1-s2 Gastrointestinal system: Nondistended, nontender, pos BS Central nervous system: No seizures, no tremors Extremities: No cyanosis, no joint deformities Skin: No rashes, no pallor Psychiatry: Affect normal // no auditory hallucinations   Data Reviewed:  Labs reviewed: Na 136, K 3.8, Cr 0.77   Family Communication: Pt in room, family at bedside  Disposition: Status is: Inpatient Remains inpatient appropriate because: Severity of illness  Planned Discharge Destination:  Inpatient Psychiatry    Author: SMarylu Lund MD 05/22/2022 5:23 PM  For on call review [email or web address]

## 2022-05-22 NOTE — Progress Notes (Signed)
Modified Barium Swallow Progress Note  Patient Details  Name: [name] MRN: [id number] Date of Birth: January 15, 1951  Today's Date: 05/22/2022  Modified Barium Swallow completed.  Full report located under Chart Review in the Imaging Section.  Brief recommendations include the following:  Clinical Impression  Patient presents with a mild oral and a moderate pharyngeal phase dysphagia as per this MBS. In addition, questionable narrowing of cervical esophagus (no radiologist present to confirm). SLP assessed patient's toleration of the following barium consistencies: thin, nectar and honey thick liquids, puree/pudding solids. During oral phase, patient exhibited decreased anterior to posterior transit of boluses which was mostly noted with thicker liquids (honey thick, puree/pudding). Minimal amount of barium remained on lips and trace to min in oral cavity post initial swallows and with puree solids and honey thick liquids, patient exhibited piecemeal swallowing. During pharyngeal phase of swallow, patient exhibited instances of swallow initiation delay at level of vallecular sinus, however this was not consistent or frequent. He did exhibit vallecular and posterior pharyngeal wall residuals with puree, nectar, thin and honey thick liquids, and pyrifom sinus residuals s/p sips of thin and nectar thick liquids. The amount of vallecular sinus residuals was moderate with puree solids and the amount of vallecular, posterior pharyngeal wall and pyriform sinus residuals was mild overall, clearing to trace to mild with cued dry swallows. He exhibited aspiration during the swallow (PAS 7) with cup sip of thin liquids and with patient reacting with a cough response. He exhibited penetration of nectar thick liquids via cup sip but with cough response effective to clear penetrate. He also exhibited an instance of penetration after the swallow with nectar thick liquids from pyriform sinus residuals. With teaspoon  sips of thin liquids, patient did not exhibit any instances of penetration or aspiration during or after the swallow, but did continue with pharyngeal residuals. Patient did sense penetration and aspiration with observed cough and cough was effective to clear penetrate. A few instances of retrograde movement of barium in the cervical esophagus were observed. SLP is recommending initiate PO diet of thin liquids only by spoon sips only with strict aspiration precautions to only have PO's when patient is fully awake and alert, must be sitting upright and after oral care, stop if excessive coughing observed. SLP will continue to follow patient for diet toleration and ability to upgrade (likely will need repeat MBS prior to any PO changes).   Swallow Evaluation Recommendations       SLP Diet Recommendations: Thin liquid   Liquid Administration via: Spoon   Medication Administration: Other (Comment) (medications in liquid form via spoon sips or by alternative means)   Supervision: Full supervision/cueing for compensatory strategies;Staff to assist with self feeding   Compensations: Slow rate;Small sips/bites;Clear throat intermittently       Oral Care Recommendations: Oral care BID;Staff/trained caregiver to provide oral care;Oral care before and after PO   Other Recommendations: Have oral suction available   Sonia Baller, MA, CCC-SLP Speech Therapy

## 2022-05-23 DIAGNOSIS — R401 Stupor: Secondary | ICD-10-CM | POA: Diagnosis not present

## 2022-05-23 DIAGNOSIS — D649 Anemia, unspecified: Secondary | ICD-10-CM | POA: Diagnosis not present

## 2022-05-23 DIAGNOSIS — G9341 Metabolic encephalopathy: Secondary | ICD-10-CM | POA: Diagnosis not present

## 2022-05-23 DIAGNOSIS — K50918 Crohn's disease, unspecified, with other complication: Secondary | ICD-10-CM | POA: Diagnosis not present

## 2022-05-23 LAB — GLUCOSE, CAPILLARY
Glucose-Capillary: 106 mg/dL — ABNORMAL HIGH (ref 70–99)
Glucose-Capillary: 87 mg/dL (ref 70–99)
Glucose-Capillary: 120 mg/dL — ABNORMAL HIGH (ref 70–99)

## 2022-05-23 MED ORDER — TRAVASOL 10 % IV SOLN
INTRAVENOUS | Status: AC
  Filled 2022-05-23: qty 1224

## 2022-05-23 MED ORDER — SERTRALINE HCL 25 MG PO TABS: 25.0000 mg | ORAL_TABLET | Freq: Every day | ORAL | Status: DC

## 2022-05-23 NOTE — Progress Notes (Signed)
  Progress Note   Patient: [name] FHL:[id number] DOB: 22-Sep-1950 DOA: 05/13/2022     10 DOS: the patient was seen and examined on 05/23/2022   Brief hospital course: 71yo with hx Crohn's with persistent abd pain s/p extensive surgeries and is currently TPN dependent. Pt presented with unresponsiveness in apparent suicide attempt via overdose. Pt remained unresponsive and later was unable to maintain airway, prompting intubation and transfer to ICU. Pt now extubated and care transferred to hospitalist.  Assessment and Plan: Intentional overdose, depression -Psychiatry following. Recs for inpatient Psych -Currently continued on prozac 55m nightly. Discussed with psychiatry, likely transition to zoloft liquid as it tastes better -Pt reports feeling stronger, both emotionally and physically today -Sitter at bedside  Toxic metabolic encephalopathy -Appropriately conversant -Suspect mental status change related to intentional medication overdose -Home meds were on hold per PCCM -Recent CT head and MRI brain reviewed, neg for acute pathology -Mentation seems much improved and pt speaking more clearly  Aspiration PNA -cont on unasyn -Initially did not pass SLP eval -On 11/6, pt underwent MBS and is now able to have clears with thickened liquids  Chron's disease -s/p multiple surgeries in past -Currently TPN dependent  Severe protein calorie malnutrition -On TPN -Recheck cmp in  AM -Membranes appear somewhat dry. Pt reports hx dehydration in past even when eating with TPN -Continued on basal IVF  Chronic pain -Was on morphine PTA -Now on toradol PRN -Palliative Care consulted to assist with symptom management in light of hx uncontrolled pain -Appreciate assistance by Palliative Care. Now on PRN dilaudid per Palliative  Afib -Rate controlled at this time -Noted to be transient event while in ICU that quickly resolved  Chronic prostatitis -Continued on Unasyn at this  time -Clears started today. If pt demonstrates being able to reliably tolerate PO, would resume suppressive bactrim and oxybutynin   Bladder calculi -Recommend f/u with Urology as outpatient -Renal function unremarkable  Anemia -Stable -No evidence of acute blood loss      Subjective: In better spirits this AM. Reports feeling stronger  Physical Exam: Vitals:   05/22/22 1649 05/23/22 0105 05/23/22 0617 05/23/22 1402  BP: 129/86 119/74 (!) 126/53 131/64  Pulse: 74 73 69 66  Resp: 17 14 14 17   Temp: 97.9 F (36.6 C) 97.7 F (36.5 C) 97.9 F (36.6 C) (!) 97.5 F (36.4 C)  TempSrc: Oral Oral Oral Axillary  SpO2: 97% 98% 98% 99%  Weight:      Height:       General exam: Awake, laying in bed, in nad Respiratory system: Normal respiratory effort, no wheezing Cardiovascular system: regular rate, s1, s2 Gastrointestinal system: Soft, nondistended, positive BS Central nervous system: CN2-12 grossly intact, strength intact Extremities: Perfused, no clubbing Skin: Normal skin turgor, no notable skin lesions seen Psychiatry: Mood normal // no visual hallucinations   Data Reviewed:  There are no new results to review at this time.  Family Communication: Pt in room, family at bedside  Disposition: Status is: Inpatient Remains inpatient appropriate because: Severity of illness  Planned Discharge Destination:  Inpatient Psychiatry    Author: SMarylu Lund MD 05/23/2022 2:26 PM  For on call review [email or web address]

## 2022-05-23 NOTE — Plan of Care (Signed)
  Problem: Education: Goal: Knowledge of General Education information will improve Description: Including pain rating scale, medication(s)/side effects and non-pharmacologic comfort measures Outcome: Progressing   Problem: Pain Managment: Goal: General experience of comfort will improve Outcome: Progressing   Problem: Safety: Goal: Ability to remain free from injury will improve Outcome: Progressing   

## 2022-05-23 NOTE — Progress Notes (Signed)
PHARMACY - TOTAL PARENTERAL NUTRITION CONSULT NOTE   Indication: Crohn's disease and complex past surgical history including ex lap with small bowel resection, lysis of adhesion, mesh explantation, and diverting loop ileostomy in April 2023, on TPN prior to admission  Patient Measurements: Height: _0  (177.8 cm) Weight: 64.7 kg (142 lb 10.2 oz) IBW/kg (Calculated) : 73   Body mass index is 20.47 kg/m.  Assessment: Patient with PMH of Crohn's disease and complex surgical history as above on chronic TPN who presented on 05/13/22 with altered mental status after spouse found patient unresponsive with multiple pill bottles open around him. Patient was transported from SNF to ED the day prior for generalized weakness and acute worsening of his chronic abdominal pain. Patient decided to return home instead of his SNF. Pharmacy consulted to resume TPN as inpatient.   Discharge from [facility] 04/19/22:  TPN pharmacist at [facility]:  Estimated Nutrient Requirements: Protein: 1.5 - 1.7 g/kg/day (98 - 104 g/day), Total kcal: 30 - 35 kcal/kg/day (1830 - 1983 kcal/day)  TPN Formulation at [place] per TPN pharmacist: [A 6.6%, D 20%, F 3.1%] @ goal rate, 65 mL/hr (TV: 1560 mL/day) provides: Protein 1.7 g/kg/day 103 g/day, Total kcal 32 kcal/kg/day 1971 kcal/day  NPC: N ratio 94   Admitted at [facility] 04/29/22-05/03/22: Nutritional Goals: TPN 80 mL/hr (provides 113 g of protein and 1943 kcals per day) RD Assessment: (10/16): 2100-2450kcal (30-35kcal/kg), Protein 105-130g (1.5-1.8g/kg) Utilize intralipid rather than SMOF as 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  [facility]: [name], PharmD, ph: [phone] 10/28 Most recent formula: Total volume 1560 mL:  Dextrose 220 g/day, Plenamine 120 g/day, SMOFlipid 50 g/day, NaCl 75 mEq, CaGluc 6 mEq, Potassium Ac 20 mEq, Potassium Phos 13.5 nM, Mag Sulfate 8 mEq, Vitamins/trace  elements  Glucose / Insulin: No hx DM. CBG 109 & 106 during 18 hr cyclic TPN rate of 527 ml/hr  Electrolytes:  no labs 11/7: on 11/6: WNL. CO2 21> 23 w/ change to max acetate 11/5  Renal: SCr WNL.  BUN 26; good UOP Hepatic:  11/6: AST/ALT/Alk phos all increased and elevated. TBili WNL, TG stable WNL; albumin remains low (2.4 11/6)  I/O: ileostomy output 650 mL.  UOP 2775 mL.  Overall, net + 540.78m  IVF LR @ 75 ml/hr GI Imaging: - 10/27 CTa/p: no significant interval change since 10/14 CT. No evidence of bowel obstruction or other complicating feature at the anastomotic site or ostomy. - 10/14 CTa/p: previous surgical changes noted (transverse-sigmoid anastomosis) with stable inflammation since 10/12 CT GI Surgeries / Procedures: n/a  Central access: prior to admission - double lumen CVC TPN start date: started PTA  Nutritional Goals: TPN goal rate is 85 mL/hr and provides 122 g of protein and 2018 kcals per day.  RD Assessment this admission:  11/2 - Continues on TPN at higher calorie provision to prevent further weight loss.  Estimated Needs Total Energy Estimated Needs: 2015-2350kcal (30-35kcal/kg) Total Protein Estimated Needs: 100-135 (1.5-2.0g/kg) Total Fluid Estimated Needs: 1700-21036m Current Nutrition:  NPO>> CLD 11/6 - 11/3 Did not pass SLP eval, continue NPO at this time with no PO meds. 11/6  MBS: SLP rec thin liquid via spoon. Small sips/bites. Clear throat immediately  Plan:  Continue 18 hour cycle of TPN until able to decrease MIVF rate not able to advance cycle to 12 hrs until IVF rate is lower.  Max fluid rate (TPN + MIVF) usually ~ 200  ml/hr CBGs to evaluate cyclic TPN: CBG 2 hrs past cyclic TPN start 67:20: 1 hour past TPN DC 13:00; middle of TPN 06:00  Electrolytes in TPN:  Na 45 mEq/L K 40 mEq/L Ca 5 mEq/L Mg 2.5 mEq/L Phos 15 mmol/L Cl:Ac = max Ac Add standard MVI and trace elements to TPN continue pepcid to TPN  IVF LR @ 75 ml/hr per MD Monitor TPN  labs on Mon/Thurs at minimum   Eudelia Bunch, Pharm.D Use secure chat for questions 05/23/2022 8:11 AM

## 2022-05-23 NOTE — Progress Notes (Signed)
Chaplain met with [name] to provide support.  [name] relayed that he was feeling guilty that he had hurt his family so much through his suicide attempt. Chaplain facilitated him processing what he was feeling in the moment

## 2022-05-24 DIAGNOSIS — G9341 Metabolic encephalopathy: Secondary | ICD-10-CM | POA: Diagnosis not present

## 2022-05-24 DIAGNOSIS — R531 Weakness: Secondary | ICD-10-CM

## 2022-05-24 DIAGNOSIS — J9601 Acute respiratory failure with hypoxia: Secondary | ICD-10-CM | POA: Diagnosis not present

## 2022-05-24 DIAGNOSIS — G894 Chronic pain syndrome: Secondary | ICD-10-CM | POA: Diagnosis present

## 2022-05-24 DIAGNOSIS — E43 Unspecified severe protein-calorie malnutrition: Secondary | ICD-10-CM | POA: Diagnosis not present

## 2022-05-24 DIAGNOSIS — T50902A Poisoning by unspecified drugs, medicaments and biological substances, intentional self-harm, initial encounter: Secondary | ICD-10-CM | POA: Diagnosis present

## 2022-05-24 DIAGNOSIS — J690 Pneumonitis due to inhalation of food and vomit: Secondary | ICD-10-CM

## 2022-05-24 DIAGNOSIS — R401 Stupor: Secondary | ICD-10-CM | POA: Diagnosis not present

## 2022-05-24 DIAGNOSIS — D649 Anemia, unspecified: Secondary | ICD-10-CM | POA: Diagnosis not present

## 2022-05-24 DIAGNOSIS — K50918 Crohn's disease, unspecified, with other complication: Secondary | ICD-10-CM | POA: Diagnosis not present

## 2022-05-24 HISTORY — DX: Pneumonitis due to inhalation of food and vomit: J69.0

## 2022-05-24 LAB — CBC WITH DIFFERENTIAL/PLATELET
Abs Immature Granulocytes: 0.11 10*3/uL — ABNORMAL HIGH (ref 0.00–0.07)
Basophils Absolute: 0 10*3/uL (ref 0.0–0.1)
Basophils Relative: 0 %
Eosinophils Absolute: 0.3 10*3/uL (ref 0.0–0.5)
Eosinophils Relative: 5 %
HCT: 30.6 % — ABNORMAL LOW (ref 39.0–52.0)
Hemoglobin: 9.1 g/dL — ABNORMAL LOW (ref 13.0–17.0)
Immature Granulocytes: 2 %
Lymphocytes Relative: 29 %
Lymphs Abs: 1.9 10*3/uL (ref 0.7–4.0)
MCH: 23.6 pg — ABNORMAL LOW (ref 26.0–34.0)
MCHC: 29.7 g/dL — ABNORMAL LOW (ref 30.0–36.0)
MCV: 79.5 fL — ABNORMAL LOW (ref 80.0–100.0)
Monocytes Absolute: 0.5 10*3/uL (ref 0.1–1.0)
Monocytes Relative: 7 %
Neutro Abs: 3.9 10*3/uL (ref 1.7–7.7)
Neutrophils Relative %: 57 %
Platelets: 231 10*3/uL (ref 150–400)
RBC: 3.85 MIL/uL — ABNORMAL LOW (ref 4.22–5.81)
RDW: 18.8 % — ABNORMAL HIGH (ref 11.5–15.5)
WBC: 6.7 10*3/uL (ref 4.0–10.5)
nRBC: 0 % (ref 0.0–0.2)

## 2022-05-24 LAB — COMPREHENSIVE METABOLIC PANEL
ALT: 122 U/L — ABNORMAL HIGH (ref 0–44)
AST: 74 U/L — ABNORMAL HIGH (ref 15–41)
Albumin: 2.5 g/dL — ABNORMAL LOW (ref 3.5–5.0)
Alkaline Phosphatase: 219 U/L — ABNORMAL HIGH (ref 38–126)
Anion gap: 7 (ref 5–15)
BUN: 23 mg/dL (ref 8–23)
CO2: 26 mmol/L (ref 22–32)
Calcium: 8.6 mg/dL — ABNORMAL LOW (ref 8.9–10.3)
Chloride: 108 mmol/L (ref 98–111)
Creatinine, Ser: 0.75 mg/dL (ref 0.61–1.24)
GFR, Estimated: >60 mL/min (ref >60)
Glucose, Bld: 100 mg/dL — ABNORMAL HIGH (ref 70–99)
Potassium: 4.1 mmol/L (ref 3.5–5.1)
Sodium: 141 mmol/L (ref 135–145)
Total Bilirubin: 0.6 mg/dL (ref 0.3–1.2)
Total Protein: 6 g/dL — ABNORMAL LOW (ref 6.5–8.1)

## 2022-05-24 LAB — MAGNESIUM: Magnesium: 2 mg/dL (ref 1.7–2.4)

## 2022-05-24 LAB — GLUCOSE, CAPILLARY
Glucose-Capillary: 125 mg/dL — ABNORMAL HIGH (ref 70–99)
Glucose-Capillary: 95 mg/dL (ref 70–99)
Glucose-Capillary: 83 mg/dL (ref 70–99)
Glucose-Capillary: 125 mg/dL — ABNORMAL HIGH (ref 70–99)

## 2022-05-24 MED ORDER — TRAVASOL 10 % IV SOLN
INTRAVENOUS | Status: AC
  Filled 2022-05-24: qty 1218

## 2022-05-24 MED ORDER — SERTRALINE HCL 50 MG PO TABS
50.0000 mg | ORAL_TABLET | Freq: Every day | ORAL | Status: DC
  Administered 2022-05-24 – 2022-05-29 (×6): 50 mg via ORAL
  Filled 2022-05-24 (×8): qty 1

## 2022-05-24 MED ORDER — INSULIN ASPART 100 UNIT/ML IJ SOLN
0.0000 [IU] | INTRAMUSCULAR | Status: DC
  Administered 2022-05-24 – 2022-05-25 (×2): 1 [IU] via SUBCUTANEOUS

## 2022-05-24 NOTE — Progress Notes (Signed)
Nutrition Follow-up  DOCUMENTATION CODES:  Severe malnutrition in context of chronic illness  INTERVENTION:  -Check LFTs and lytes daily until TPN stable -Cycle TPN for elevated LFTs -Current TPN: 121.8g AA (1.88g/kg), 273g dextrose (GIR 3.19m/kg/min), 61g SMOFlipids, in 21036m -Recommend checking serum osmolality and adjust TPN and IVF as clinically appropriate -Consider increasing TPN volume to [id number] as appropriate  NUTRITION DIAGNOSIS:  Severe Malnutrition related to chronic illness as evidenced by percent weight loss, severe fat depletion, severe muscle depletion. ongoing  GOAL:  Patient will meet greater than or equal to 90% of their needs Met   MONITOR:  Diet advancement, Labs, Other (Comment) (TPN)  REASON FOR ASSESSMENT:  F/U TPN  ASSESSMENT:  Pt is a 71y4yowith PMH of Crohn's disease resulting in a complicated surgical history followed at [facility] to include left hemicolectomy and ileostomy with multiple other complications to include TPN dependence, chronic prostatitis, SVT, and asthma who presents with generalized weakness, acute worsening of his chronic abdominal pain and AMS.  Spoke with pharmacy this AM about TPN. TPN is being cycled due to LFTs trending up. He continues to receive 1800m55m/day, and 2108mL69m/day. Reassessed fluid needs. Consider adding additional fluid to TPN to provide 2400mL/33mand reduce IVF as appropriate. Recommend checking serum osmolality to determine hydration status. Pt is also ordered a clear liquid diet but minimal documented po intake. Pharmacy adjusting lytes per labs. RD to continue to follow.  Current TPN order: 121.8g AA (1.88g/kg), 273g dextrose (GIR 3.9mg/kg41mn), 61g SMOFlipids, in 2100mL.  41m Order:   Diet Order             Diet clear liquid Room service appropriate? Yes; Fluid consistency: Thin  Diet effective now                   EDUCATION NEEDS:  Education needs have been addressed  Skin:  Skin  Assessment: Reviewed RN Assessment  Last BM:  10/30-ostomy  Height:  Ht Readings from Last 1 Encounters:  05/14/22 _0  (1.778 m)    Weight:  Wt Readings from Last 1 Encounters:  05/15/22 64.7 kg    BMI:  Body mass index is 20.47 kg/m.  Estimated Nutritional Needs:  Kcal:  2015-2350kcal Protein:  100-135g Fluid:  2400mL (Ho93may Segar)   Katie BusCandise Bowens LDN, CNSC See AMiON for contact information

## 2022-05-24 NOTE — Plan of Care (Signed)
  Problem: Education: Goal: Knowledge of General Education information will improve Description: Including pain rating scale, medication(s)/side effects and non-pharmacologic comfort measures Outcome: Progressing   Problem: Pain Managment: Goal: General experience of comfort will improve Outcome: Progressing   Problem: Safety: Goal: Ability to remain free from injury will improve Outcome: Progressing   

## 2022-05-24 NOTE — Progress Notes (Addendum)
PHARMACY - TOTAL PARENTERAL NUTRITION CONSULT NOTE   Indication: Crohn's disease and complex past surgical history including ex lap with small bowel resection, lysis of adhesion, mesh explantation, and diverting loop ileostomy in April 2023, on TPN prior to admission  Patient Measurements: Height: _0  (177.8 cm) Weight: 64.7 kg (142 lb 10.2 oz) IBW/kg (Calculated) : 73   Body mass index is 20.47 kg/m.  Assessment: Patient with PMH of Crohn's disease and complex surgical history as above on chronic TPN who presented on 05/13/22 with altered mental status after spouse found patient unresponsive with multiple pill bottles open around him. Patient was transported from SNF to ED the day prior for generalized weakness and acute worsening of his chronic abdominal pain. Patient decided to return home instead of his SNF. Pharmacy consulted to resume TPN as inpatient.   Discharge from [facility] 04/19/22:  TPN pharmacist at [facility]:  Estimated Nutrient Requirements: Protein: 1.5 - 1.7 g/kg/day (98 - 104 g/day), Total kcal: 30 - 35 kcal/kg/day (1830 - 1983 kcal/day)  TPN Formulation at [place] per TPN pharmacist: [A 6.6%, D 20%, F 3.1%] @ goal rate, 65 mL/hr (TV: 1560 mL/day) provides: Protein 1.7 g/kg/day 103 g/day, Total kcal 32 kcal/kg/day 1971 kcal/day  NPC: N ratio 94   Admitted at [facility] 04/29/22-05/03/22: Nutritional Goals: TPN 80 mL/hr (provides 113 g of protein and 1943 kcals per day) RD Assessment: (10/16): 2100-2450kcal (30-35kcal/kg), Protein 105-130g (1.5-1.8g/kg) Utilize intralipid rather than SMOF as 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  [facility]: [name], PharmD, ph: [phone] 10/28 Most recent formula: Total volume 1560 mL:  Dextrose 220 g/day, Plenamine 120 g/day, SMOFlipid 50 g/day, NaCl 75 mEq, CaGluc 6 mEq, Potassium Ac 20 mEq, Potassium Phos 13.5 nM, Mag Sulfate 8 mEq, Vitamins/trace elements  Last  labs 11/6 Glucose / Insulin: No hx DM. CBG well controlled while on and off TPN Electrolytes:  no labs 11/7: on 11/6: WNL. CO2 21> 23 w/ change to max acetate 11/5  Renal: SCr WNL.  BUN 26; good UOP Hepatic:  11/6: AST/ALT/Alk phos all increased and elevated. TBili WNL, TG stable WNL; albumin remains low (2.4 11/6)  I/O: ileostomy output remains stable 5404260820 ml/day; UOP excellent; +2L this admission - MIVF LR @ 75 ml/hr - Pt still requiring almost 2L daily in addition to TPN (2L daily) to maintain clinical hydration per MD; ostomy does not appear to be high-output, although UOP may be contributing as well. GI Imaging: - 11/6 abd [place] - normal liver (checked d/t elevated LFTs) - 10/27 CTa/p: no significant interval change since 10/14 CT. No evidence of bowel obstruction or other complicating feature at the anastomotic site or ostomy. - 10/14 CTa/p: previous surgical changes noted (transverse-sigmoid anastomosis) with stable inflammation since 10/12 CT GI Surgeries / Procedures: n/a  Central access: prior to admission - double lumen CVC TPN start date: started PTA  Nutritional Goals: TPN provides 122 g of protein and 2065 kcals per day.  RD Assessment this admission:  11/2 - Continues on TPN at higher calorie provision to prevent further weight loss.  Estimated Needs Total Energy Estimated Needs: 2015-2350kcal (30-35kcal/kg) Total Protein Estimated Needs: 100-135 (1.5-2.0g/kg) Total Fluid Estimated Needs: 1700-2160m  Current Nutrition:  TPN CLD 11/6 >>   Plan:  MIVF is limiting ability to transition to 12-hr TPN cycle (prefer to keep max total fluid rate < 200 ml/hr Discussed fluid requirements w/ RD; if continues to need ~4L  daily, may need to reconsider cyclic TPN strategy RD planning to check serum osmolality tomorrow Continue 18 hour cyclic TPN for now Electrolytes in TPN: increase Na Na 75 mEq/L K 40 mEq/L Ca 5 mEq/L Mg 2.5 mEq/L Phos 15 mmol/L Cl:Ac = max Ac Add  standard MVI and trace elements to TPN continue pepcid in TPN  CBGs for cyclic TPN: 2 hrs after start, 1 hour after stop; middle of ON cycle; middle of OFF cycle IVF per MD Monitor TPN labs on Mon/Thurs at minimum  Reuel Boom, PharmD, BCPS [phone] 05/24/2022, 7:34 AM

## 2022-05-24 NOTE — Consult Note (Signed)
Face-to-Face Psychiatry Consult   Reason for Consult:  Suicide Attempt  Referring Physician:  Red Christians   Patient Identification: [name] MRN:  [id number] Principal Diagnosis: Obtundation Diagnosis:  Principal Problem:   Obtundation Active Problems:   Metabolic encephalopathy   Chronic anemia   Acute respiratory failure with hypoxia (HCC)   Severe malnutrition ([place])   Total Time spent with patient: 20 minutes  Subjective:   71yo with hx Crohn's with persistent abd pain s/p extensive surgeries and is currently TPN dependent. Pt presented with unresponsiveness in apparent suicide attempt via overdose. Pt remained unresponsive and later was unable to maintain airway, prompting intubation and transfer to ICU. Pt now extubated and care transferred to hospitalist.   HPI: Patient seen and reassessed by the psychiatric nurse practitioner.  Patient was observed to be sitting upright in his chair, essentially slumped down yet comfortable. He began to smile and offered a warm welcome to this provider. He reports that he is feeling much better. Patient initially said he would be able to take pills, however after reading SLT note at the bedside he remains on clear liquids by spoon. Patient remains in good spirits, and seems motivated to go to inpatient psychiatric hospital. He was inquiring about what to expect, duration of time and treatment plan. He endorses ongoing depression symptoms, yet remains hopeful. He continues to show remorse and regret for his suicide attempt. In regards to his mood, anxiety, and suicidality they continue to remain present.   Psychiatric consult service will continue to monitor at this time. Pt remains at severe risk of suicide without inpatient psychiatric admission and treatment. He denies any active suicidal ideations at this time.   Past Psychiatric History: Depression  Risk to Self:   yes suicide attempt Risk to Others:   Denies Prior Inpatient Therapy:    Denies Prior Outpatient Therapy:   Denies  Past Medical History:  Past Medical History:  Diagnosis Date   Acute prostatitis 07/24/2007   Qualifier: Diagnosis of  By: [name] MD, [name]    Allergy    mild   Arthritis    osteoarthritis   Asthma    Blood transfusion without reported diagnosis    BPH (benign prostatic hypertrophy) with urinary obstruction    Crohn's ileitis ([place]) suspected 05/03/2017   Dilated aortic root ([place])    noted on echo 08/2012   Diverticulitis of colon    EPIDIDYMITIS 02/15/2010   Qualifier: Diagnosis of  By: [name] MD, [name]    GERD (gastroesophageal reflux disease)    H/O: GI bleed    Hemorrhoids    Hepatitis 1975   unknown type    HERPES SIMPLEX INFECTION 10/14/2007   Qualifier: Diagnosis of  By: [name] MD, [name]    Hiatal hernia    Ileus following gastrointestinal surgery ([place]) 12/26/2011   Long term (current) use of systemic steroids 06/18/2018   Psoriasis    sees [name] at [facility].   Recurrent ventral incisional hernia 05/10/2012   SVT (supraventricular tachycardia)    Ulcer 08/21/2013   ileal    Past Surgical History:  Procedure Laterality Date   BOWEL RESECTION  12/19/2011   Procedure: SMALL BOWEL RESECTION;  Surgeon: [name], MD;  Location: [facility];  Service: General;  Laterality: N/A;  with anastamosis and insertion mesh   BRONCHOSCOPY     COLON SURGERY  01/2004   x 2   COLONOSCOPY W/ BIOPSIES  04/26/2017   per [name],  no polyps, benign inflammation, repeat in 5 yrs    CYSTOSCOPY     ESOPHAGOGASTRODUODENOSCOPY     HEMICOLECTOMY     left side, at [facility], diverticulitis   HEMORRHOID BANDING     HERNIA REPAIR     [phone] incisional hernia   ILEOSTOMY     ILEOSTOMY CLOSURE     INSERTION OF MESH  07/31/2012   Procedure: INSERTION OF MESH;  Surgeon: [name], MD;  Location: [facility];  Service: General;;   LAPAROTOMY  12/19/2011   Procedure: EXPLORATORY LAPAROTOMY;  Surgeon: [name], MD;   Location: [facility];  Service: General;  Laterality: N/A;   PACEMAKER IMPLANT N/A 12/01/2020   Procedure: PACEMAKER IMPLANT;  Surgeon: [name], MD;  Location: [facility];  Service: Cardiovascular;  Laterality: N/A;   PACEMAKER INSERTION Left    TONSILLECTOMY     UPPER GASTROINTESTINAL ENDOSCOPY     VASECTOMY     VENTRAL HERNIA REPAIR  07/31/2012   Procedure: HERNIA REPAIR VENTRAL ADULT;  Surgeon: [name], MD;  Location: [facility];  Service: General;  Laterality: N/A;   Family History:  Family History  Problem Relation Age of Onset   Lung cancer Mother    Leukemia Father    Hypertension Father    Heart disease Father    Heart attack Father    Prostate cancer Father    Prostate cancer Paternal Uncle    Colon cancer Neg Hx    Stomach cancer Neg Hx    Colon polyps Neg Hx    Esophageal cancer Neg Hx    Rectal cancer Neg Hx    Family Psychiatric  History: none reported  Social History:  Social History   Substance and Sexual Activity  Alcohol Use Not Currently   Comment: occ     Social History   Substance and Sexual Activity  Drug Use Yes   Types: Oxycodone    Social History   Socioeconomic History   Marital status: Married    Spouse name: Not on file   Number of children: 2   Years of education: Not on file   Highest education level: Not on file  Occupational History   Occupation: EHS Freight forwarder    Employer: [employer]  Tobacco Use   Smoking status: Never   Smokeless tobacco: Never  Vaping Use   Vaping Use: Never used  Substance and Sexual Activity   Alcohol use: Not Currently    Comment: occ   Drug use: Yes    Types: Oxycodone   Sexual activity: Not on file  Other Topics Concern   Not on file  Social History Narrative   He is married with 2 sons 1 son is an Art gallery manager and 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   He is Nurse, 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   Rare if any caffeine   Rare alcohol and never smoker   Social Determinants of Radio broadcast assistant Strain: Low Risk  (04/10/2022)   Overall Financial Resource Strain (CARDIA)    Difficulty of Paying Living Expenses: Not hard at all  Food Insecurity: No Food Insecurity (05/14/2022)   Hunger Vital Sign    Worried About Running Out of Food in the Last Year: Never true    Ran Out of Food in the Last Year: Never true  Transportation Needs:  No Transportation Needs (05/14/2022)   PRAPARE - Hydrologist (Medical): No    Lack of Transportation (Non-Medical): No  Physical Activity: Inactive (04/10/2022)   Exercise Vital Sign    Days of Exercise per Week: 0 days    Minutes of Exercise per Session: 0 min  Stress: Stress Concern Present (04/10/2022)   [place]    Feeling of Stress : To some extent  Social Connections: Not on file   Additional Social History:    Allergies:   Allergies  Allergen Reactions   Purinethol [Mercaptopurine] Other (See Comments)    Pancreatitis    Shellfish Allergy Anaphylaxis and Swelling    Can use standard SMOF lipid formulation for TPN without any issue.    Humira [Adalimumab] Other (See Comments)    Developed antibodies   Tape Rash   Wound Dressing Adhesive Rash    Labs:  Results for orders placed or performed during the hospital encounter of 05/13/22 (from the past 48 hour(s))  Glucose, capillary     Status: Abnormal   Collection Time: 05/22/22  8:06 PM  Result Value Ref Range   Glucose-Capillary 109 (H) 70 - 99 mg/dL    Comment: Glucose reference range applies only to samples taken after fasting for at least 8 hours.  Glucose, capillary     Status: Abnormal   Collection Time: 05/23/22  5:58 AM  Result Value Ref Range   Glucose-Capillary 106 (H) 70 - 99 mg/dL    Comment: Glucose reference range  applies only to samples taken after fasting for at least 8 hours.  Glucose, capillary     Status: None   Collection Time: 05/23/22 12:45 PM  Result Value Ref Range   Glucose-Capillary 87 70 - 99 mg/dL    Comment: Glucose reference range applies only to samples taken after fasting for at least 8 hours.  Glucose, capillary     Status: Abnormal   Collection Time: 05/23/22  7:53 PM  Result Value Ref Range   Glucose-Capillary 120 (H) 70 - 99 mg/dL    Comment: Glucose reference range applies only to samples taken after fasting for at least 8 hours.  Glucose, capillary     Status: Abnormal   Collection Time: 05/24/22  5:58 AM  Result Value Ref Range   Glucose-Capillary 125 (H) 70 - 99 mg/dL    Comment: Glucose reference range applies only to samples taken after fasting for at least 8 hours.  Comprehensive metabolic panel     Status: Abnormal   Collection Time: 05/24/22 10:44 AM  Result Value Ref Range   Sodium 141 135 - 145 mmol/L   Potassium 4.1 3.5 - 5.1 mmol/L   Chloride 108 98 - 111 mmol/L   CO2 26 22 - 32 mmol/L   Glucose, Bld 100 (H) 70 - 99 mg/dL    Comment: Glucose reference range applies only to samples taken after fasting for at least 8 hours.   BUN 23 8 - 23 mg/dL   Creatinine, Ser 0.75 0.61 - 1.24 mg/dL   Calcium 8.6 (L) 8.9 - 10.3 mg/dL   Total Protein 6.0 (L) 6.5 - 8.1 g/dL   Albumin 2.5 (L) 3.5 - 5.0 g/dL   AST 74 (H) 15 - 41 U/L   ALT 122 (H) 0 - 44 U/L   Alkaline Phosphatase 219 (H) 38 - 126 U/L   Total Bilirubin 0.6 0.3 - 1.2 mg/dL   GFR, Estimated >60 >  60 mL/min    Comment: (NOTE) Calculated using the CKD-EPI Creatinine Equation (2021)    Anion gap 7 5 - 15    Comment: Performed at [facility], [place] [address] [place], [place] [address]  CBC with Differential/Platelet     Status: Abnormal   Collection Time: 05/24/22 10:44 AM  Result Value Ref Range   WBC 6.7 4.0 - 10.5 K/uL   RBC 3.85 (L) 4.22 - 5.81 MIL/uL   Hemoglobin 9.1 (L) 13.0 - 17.0  g/dL   HCT 30.6 (L) 39.0 - 52.0 %   MCV 79.5 (L) 80.0 - 100.0 fL   MCH 23.6 (L) 26.0 - 34.0 pg   MCHC 29.7 (L) 30.0 - 36.0 g/dL   RDW 18.8 (H) 11.5 - 15.5 %   Platelets 231 150 - 400 K/uL   nRBC 0.0 0.0 - 0.2 %   Neutrophils Relative % 57 %   Neutro Abs 3.9 1.7 - 7.7 K/uL   Lymphocytes Relative 29 %   Lymphs Abs 1.9 0.7 - 4.0 K/uL   Monocytes Relative 7 %   Monocytes Absolute 0.5 0.1 - 1.0 K/uL   Eosinophils Relative 5 %   Eosinophils Absolute 0.3 0.0 - 0.5 K/uL   Basophils Relative 0 %   Basophils Absolute 0.0 0.0 - 0.1 K/uL   Immature Granulocytes 2 %   Abs Immature Granulocytes 0.11 (H) 0.00 - 0.07 K/uL    Comment: Performed at [facility], [place] [address] [place], [place] [address]  Magnesium     Status: None   Collection Time: 05/24/22 10:44 AM  Result Value Ref Range   Magnesium 2.0 1.7 - 2.4 mg/dL    Comment: Performed at [facility], [place] [address] [place], [place] [address]  Glucose, capillary     Status: None   Collection Time: 05/24/22  1:41 PM  Result Value Ref Range   Glucose-Capillary 95 70 - 99 mg/dL    Comment: Glucose reference range applies only to samples taken after fasting for at least 8 hours.  Glucose, capillary     Status: None   Collection Time: 05/24/22  3:49 PM  Result Value Ref Range   Glucose-Capillary 83 70 - 99 mg/dL    Comment: Glucose reference range applies only to samples taken after fasting for at least 8 hours.    Current Facility-Administered Medications  Medication Dose Route Frequency Provider Last Rate Last Admin   acetaminophen (TYLENOL) suppository 325 mg  325 mg Rectal Q4H PRN Cherene Altes, MD   325 mg at 05/23/22 1124   Chlorhexidine Gluconate Cloth 2 % PADS 6 each  6 each Topical Daily Cherene Altes, MD   6 each at 05/24/22 0900   diphenhydrAMINE (BENADRYL) injection 12.5 mg  12.5 mg Intravenous QHS PRN Donne Hazel, MD   12.5 mg at 05/21/22 0046   enoxaparin (LOVENOX)  injection 40 mg  40 mg Subcutaneous Q24H Joette Catching T, MD   40 mg at 05/23/22 2110   HYDROmorphone (DILAUDID) injection 0.5 mg  0.5 mg Intravenous Q6H PRN Loistine Chance, MD   0.5 mg at 05/24/22 1541   insulin aspart (novoLOG) injection 0-9 Units  0-9 Units Subcutaneous 4 times per day Polly Cobia, RPH       lactated ringers infusion   Intravenous Continuous Donne Hazel, MD 75 mL/hr at 05/24/22 1144 New Bag at 05/24/22 1144   lidocaine (LIDODERM) 5 % 1 patch  1 patch Transdermal Q24H  Cristal Generous, NP   1 patch at 05/23/22 1727   ondansetron (ZOFRAN) tablet 4 mg  4 mg Oral Q6H PRN Cherene Altes, MD       Or   ondansetron [facility]) injection 4 mg  4 mg Intravenous Q6H PRN Cherene Altes, MD   4 mg at 05/24/22 1546   Oral care mouth rinse  15 mL Mouth Rinse PRN Candee Furbish, MD       sertraline (ZOLOFT) tablet 50 mg  50 mg Oral Daily Suella Broad, FNP   50 mg at 05/24/22 1040   sodium chloride flush (NS) 0.9 % injection 10-40 mL  10-40 mL Intracatheter PRN Cherene Altes, MD   20 mL at 10/93/23 5573   TPN CYCLIC-ADULT (ION)   Intravenous Cyclic-See Admin Instructions Polly Cobia, [facility]        Musculoskeletal: Strength & Muscle Tone: Laying in bed   Gait & Station: Laying in bed   Patient leans: Laying in bed      Psychiatric Specialty Exam: Psychiatric Specialty Exam: Physical Exam Vitals and nursing note reviewed.  Constitutional:      Appearance: Normal appearance. He is normal weight.  Skin:    General: Skin is warm.     Capillary Refill: Capillary refill takes less than 2 seconds.  Neurological:     General: No focal deficit present.     Mental Status: He is alert and oriented to person, place, and time. Mental status is at baseline.  Psychiatric:        Attention and Perception: Attention and perception normal.        Mood and Affect: Affect normal. Mood is anxious and depressed.        Speech: Speech normal.        Behavior: Behavior  normal. Behavior is cooperative.        Thought Content: Thought content normal.        Cognition and Memory: Cognition normal.        Judgment: Judgment normal.     Review of Systems  Psychiatric/Behavioral:  Positive for depression and suicidal ideas (denies). Negative for hallucinations and substance abuse. The patient is nervous/anxious. The patient does not have insomnia.   All other systems reviewed and are negative.   Blood pressure 119/64, pulse 70, temperature 98 F (36.7 C), temperature source Oral, resp. rate 16, height 5' 10"  (1.778 m), weight 64.7 kg, SpO2 96 %.Body mass index is 20.47 kg/m.  General Appearance: Fairly Groomed  Eye Contact:  Good  Speech:  Clear and Coherent and Normal Rate  Volume:  Normal  Mood:  Anxious and Depressed  Affect:  Appropriate and Congruent  Thought Process:  Coherent, Linear, and Descriptions of Associations: Intact  Orientation:  Full (Time, Place, and Person)  Thought Content:  Logical  Suicidal Thoughts:  No  Homicidal Thoughts:  No  Memory:  Immediate;   Fair Recent;   Fair  Judgement:  Fair  Insight:  Fair  Psychomotor Activity:  Normal  Concentration:  Concentration: Fair and Attention Span: Fair  Recall:  [employer] of Knowledge:  Good  Language:  Good  Akathisia:  No  Handed:  Right  AIMS (if indicated):     Assets:  Communication Skills Desire for Improvement Financial Resources/Insurance Leisure Time Resilience Social Support  ADL's:  Intact  Cognition:  WNL  Sleep:         Physical Exam: Physical Exam Vitals and nursing note reviewed.  Constitutional:      Appearance: Normal appearance. He is normal weight.  Skin:    General: Skin is warm.     Capillary Refill: Capillary refill takes less than 2 seconds.  Neurological:     General: No focal deficit present.     Mental Status: He is alert and oriented to person, place, and time. Mental status is at baseline.  Psychiatric:        Attention and  Perception: Attention and perception normal.        Mood and Affect: Affect normal. Mood is anxious and depressed.        Speech: Speech normal.        Behavior: Behavior normal. Behavior is cooperative.        Thought Content: Thought content normal.        Cognition and Memory: Cognition normal.        Judgment: Judgment normal.    Review of Systems  Psychiatric/Behavioral:  Positive for depression and suicidal ideas (denies). Negative for hallucinations and substance abuse. The patient is nervous/anxious. The patient does not have insomnia.   All other systems reviewed and are negative.  pt asleep unable to assess  Blood pressure 119/64, pulse 70, temperature 98 F (36.7 C), temperature source Oral, resp. rate 16, height 5' 10"  (1.778 m), weight 64.7 kg, SpO2 96 %. Body mass index is 20.47 kg/m.  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 daily.  Switched from prozac liquid to sertraline on 11/07 due to taste.   [email or web address]   -Pt can have access to his cell phone when family is present.   Disposition: Recommend psychiatric Inpatient admission when medically cleared.  Suella Broad, FNP 05/24/2022 4:27 PM

## 2022-05-24 NOTE — Progress Notes (Signed)
PROGRESS NOTE    SHASHANK KWASNIK  NOB:[id number] DOB: 04-Mar-1951 DOA: 05/13/2022 PCP: [name], MD    Chief Complaint  Patient presents with   Altered Mental Status    Brief Narrative:  [id number] with hx Crohn's with persistent abd pain s/p extensive surgeries and is currently TPN dependent. Pt presented with unresponsiveness in apparent suicide attempt via overdose. Pt remained unresponsive and later was unable to maintain airway, prompting intubation and transfer to ICU. Pt now extubated and care transferred to hospitalist.     Assessment & Plan:   Principal Problem:   Obtundation Active Problems:   Metabolic encephalopathy   Chronic anemia   Acute respiratory failure with hypoxia (HCC)   Severe malnutrition ([place])  #1 intentional drug overdose/MDD -Patient noted to have intentionally taking pain medication and anxiolytics with intentional suicidal attempt. -Patient with some clinical improvement feeling stronger tolerating current clear diet. -Sitter at bedside. -Continue Zoloft 50 mg daily. -Patient was on Prozac and switched to liquid sertraline per psychiatry. -Continue one-to-one sitter. -Psychiatry recommending inpatient psych admission when medically cleared.  2.  Toxic metabolic encephalopathy -Likely secondary to intentional medication overdose. -Patient clinically improved conversant following commands answering questions appropriately. -Head CT, MRI brain done negative for any acute abnormalities. -Continue supportive care.  3.  Aspiration pneumonia -Seen by speech therapy underwent MBS 05/22/2022 currently on clears with thickened liquids. -Status post IV Unasyn.  4.  Crohn's disease -Status with multiple surgeries in the past. -TPN dependent.  5.  Severe protein calorie malnutrition -Currently on TPN. -Gentle hydration. -On clears. -SLP following for advancement of diet.  6.  Chronic pain -Was on morphine prior to admission currently on Toradol  as needed. -Palliative care consulted for symptom management in light of history of uncontrolled pain. -Currently on Dilaudid as needed for palliative care.  7.  A-fib -Noted to be transient A-fib in the ICU that is quickly resolved. -Currently rate controlled.  8.  Chronic prostatitis -Status post IV Unasyn. -Currently on clears. -If patient reliably able to tolerate p.o. could resume home regimen Bactrim and oxybutynin in the next 24 to 48 hours.  9.  Bladder calculi -Outpatient follow-up with urology.  10.  Anemia -Hemoglobin stable at 9.1. -Follow H&H. -Transfusion threshold hemoglobin < 7.    DVT prophylaxis: Lovenox Code Status: Full Family Communication: Updated patient, brother-in-law at bedside. Disposition: Inpatient psychiatry when medically stable.  Status is: Inpatient Remains inpatient appropriate because: Needs inpatient psychiatric admission once medically stable.   Consultants:  Neurology: Dr.Lindzen 05/15/2022 PCCM: Dr. Lake Bells 05/15/2022 Psychiatry: Sheran Fava FNP, 05/18/2022 Palliative care: Dr. Rowe Pavy 05/21/2022 Wound care RN, Phineas Douglas 05/24/2022  Procedures:  CT head 05/13/2022, 05/15/2022 CT chest abdomen and pelvis 05/12/2022 Abdominal films 05/16/2022 Chest x-ray 05/15/2022, 05/13/2022 MRI brain 05/16/2022 Right upper quadrant ultrasound 04/21/2022 EEG  Significant Hospital Events: Including procedures, antibiotic start and stop dates in addition to other pertinent events   10/28 admit obtunded found down 10/30 tx to ICU for intubation 10/31 waking up and following commands overnight.  11/1 SBT. Extubated 11/2 reveals that ingestion of pills at home was in fact a suicide attempt. 1:1 sitter, psych consult. Transfer out of ICU   Antimicrobials:  Anti-infectives (From admission, onward)    Start     Dose/Rate Route Frequency Ordered Stop   05/15/22 1830  Ampicillin-Sulbactam (UNASYN) 3 g in sodium chloride 0.9 % 100 mL IVPB         3 g 200 mL/hr over 30 Minutes Intravenous  Every 6 hours 05/15/22 1821 05/20/22 1629         Subjective: Patient laying in bed.  Stated due to his Crohn's disease and abdominal surgeries took a combination of anxiolytics and pain medications in a suicide attempt prior to admission.  Denies any current chest pain, no shortness of breath.  No change in chronic abdominal pain.  Brother-in-law at bedside.  Patient tolerating clears.  Objective: Vitals:   05/23/22 0617 05/23/22 1402 05/23/22 2104 05/24/22 0659  BP: (!) 126/53 131/64 121/64 128/60  Pulse: 69 66 65 68  Resp: 14 17 16 16   Temp: 97.9 F (36.6 C) (!) 97.5 F (36.4 C) 97.8 F (36.6 C) 97.8 F (36.6 C)  TempSrc: Oral Axillary Axillary Axillary  SpO2: 98% 99% 97% 98%  Weight:      Height:        Intake/Output Summary (Last 24 hours) at 05/24/2022 1246 Last data filed at 05/24/2022 1058 Gross per 24 hour  Intake 3414.29 ml  Output 2975 ml  Net 439.29 ml   Filed Weights   05/13/22 1832 05/15/22 1400 05/15/22 1727  Weight: 67.2 kg 66.3 kg 64.7 kg    Examination:  General exam: Appears calm and comfortable  Respiratory system: Clear to auscultation anterior lung fields.  No wheezes, no crackles, no rhonchi.  Fair air movement. Respiratory effort normal. Cardiovascular system: S1 & S2 heard, RRR. No JVD, murmurs, rubs, gallops or clicks. No pedal edema. Gastrointestinal system: Abdomen is nondistended, soft and chronic tenderness to palpation in the lower abdominal region.  Positive bowel sounds.  No rebound.  No guarding.  Ileostomy bag with loose stool noted. Central nervous system: Alert and oriented. No focal neurological deficits. Extremities: Symmetric 5 x 5 power. Skin: No rashes, lesions or ulcers Psychiatry: Judgement and insight appear fair. Mood & affect appropriate.     Data Reviewed: I have personally reviewed following labs and imaging studies  CBC: Recent Labs  Lab 05/18/22 0541 05/24/22 1044   WBC 6.9 6.7  NEUTROABS  --  3.9  HGB 9.2* 9.1*  HCT 30.2* 30.6*  MCV 79.7* 79.5*  PLT 181 413    Basic Metabolic Panel: Recent Labs  Lab 05/18/22 0541 05/18/22 2202 05/20/22 0419 05/21/22 0257 05/22/22 0257 05/24/22 1044  NA 140 140 139 138 136 141  K 3.3* 3.8 4.3 4.0 3.8 4.1  CL 109 112* 109 110 108 108  CO2 23 22 23  21* 23 26  GLUCOSE 110* 106* 107* 91 96 100*  BUN 23 26* 31* 33* 26* 23  CREATININE 0.69 0.80 1.10 1.02 0.77 0.75  CALCIUM 8.1* 8.2* 8.4* 8.3* 8.2* 8.6*  MG 2.0  --  2.3 2.3 2.0 2.0  PHOS 3.3  --  5.1* 3.3 2.6  --     GFR: Estimated Creatinine Clearance: 79.8 mL/min (by C-G formula based on SCr of 0.75 mg/dL).  Liver Function Tests: Recent Labs  Lab 05/18/22 0541 05/22/22 0257 05/24/22 1044  AST 46* 103* 74*  ALT 37 135* 122*  ALKPHOS 77 169* 219*  BILITOT 0.5 0.5 0.6  PROT 5.9* 6.0* 6.0*  ALBUMIN 2.4* 2.4* 2.5*    CBG: Recent Labs  Lab 05/22/22 2006 05/23/22 0558 05/23/22 1245 05/23/22 1953 05/24/22 0558  GLUCAP 109* 106* 87 120* 125*     Recent Results (from the past 240 hour(s))  Culture, Respiratory w Gram Stain     Status: None   Collection Time: 05/15/22  6:30 PM   Specimen: Tracheal Aspirate; Respiratory  Result  Value Ref Range Status   Specimen Description   Final    TRACHEAL ASPIRATE Performed at [facility] [address] [place], [place] [address]    Special Requests   Final    NONE Performed at [facility], [place] [address], [place], [place] [address]    Gram Stain   Final    FEW WBC PRESENT,BOTH PMN AND MONONUCLEAR FEW GRAM POSITIVE RODS    Culture   Final    FEW CORYNEBACTERIUM STRIATUM Standardized susceptibility testing for this organism is not available. No Pseudomonas species isolated NO STAPHYLOCOCCUS AUREUS ISOLATED Performed at [facility] [address] [place], [place] [address]    Report Status 05/18/2022 FINAL  Final  MRSA Next Gen by PCR, Nasal      Status: Abnormal   Collection Time: 05/15/22  6:44 PM   Specimen: Nasal Mucosa; Nasal Swab  Result Value Ref Range Status   MRSA by PCR Next Gen DETECTED (A) NOT DETECTED Final    Comment: (NOTE) The GeneXpert MRSA Assay (FDA approved for NASAL specimens only), is one component of a comprehensive MRSA colonization surveillance program. It is not intended to diagnose MRSA infection nor to guide or monitor treatment for MRSA infections. Test performance is not FDA approved in patients less than 67 years old. Performed at [facility], [place] [address] [place], [place] [address]          Radiology Studies: US Abdomen Limited RUQ (LIVER/GB)  Result Date: 05/22/2022 CLINICAL DATA:  Elevated LFTs. EXAM: ULTRASOUND ABDOMEN LIMITED RIGHT UPPER QUADRANT COMPARISON:  05/12/2022. FINDINGS: Gallbladder: No gallstones or wall thickening visualized. No sonographic Murphy sign noted by sonographer. Common bile duct: Diameter: 3.4 mm. Liver: No focal lesion identified. Within normal limits in parenchymal echogenicity. Portal vein is patent on color Doppler imaging with normal direction of blood flow towards the liver. Other: No free fluid. IMPRESSION: Normal exam. Electronically Signed   By: Brett Fairy M.D.   On: 05/22/2022 20:50   DG Swallowing Func-Speech Pathology  Result Date: 05/22/2022 Table formatting from the original result was not included. Objective Swallowing Evaluation: Type of Study: MBS-Modified Barium Swallow Study  Patient Details Name: DAYVIN ABER MRN: [id number] Date of Birth: 06-16-1951 Today's Date: 05/22/2022 Time: SLP Start Time (ACUTE ONLY): 30 -SLP Stop Time (ACUTE ONLY): 6759 SLP Time Calculation (min) (ACUTE ONLY): 25 min Past Medical History: Past Medical History: Diagnosis Date  Acute prostatitis 07/24/2007  Qualifier: Diagnosis of  By: [name] MD, [name]   Allergy   mild  Arthritis   osteoarthritis  Asthma   Blood transfusion without reported diagnosis    BPH (benign prostatic hypertrophy) with urinary obstruction   Crohn's ileitis ([place]) suspected 05/03/2017  Dilated aortic root ([place])   noted on echo 08/2012  Diverticulitis of colon   EPIDIDYMITIS 02/15/2010  Qualifier: Diagnosis of  By: [name] MD, [name]   GERD (gastroesophageal reflux disease)   H/O: GI bleed   Hemorrhoids   Hepatitis 1975  unknown type   HERPES SIMPLEX INFECTION 10/14/2007  Qualifier: Diagnosis of  By: [name] MD, [name]   Hiatal hernia   Ileus following gastrointestinal surgery ([place]) 12/26/2011  Long term (current) use of systemic steroids 06/18/2018  Psoriasis   sees [name] at [facility].  Recurrent ventral incisional hernia 05/10/2012  SVT (supraventricular tachycardia)   Ulcer 08/21/2013  ileal Past Surgical History: Past Surgical History: Procedure Laterality Date  BOWEL RESECTION  12/19/2011  Procedure: SMALL BOWEL RESECTION;  Surgeon: [name], MD;  Location: [facility];  Service: General;  Laterality: N/A;  with anastamosis and insertion mesh  BRONCHOSCOPY    COLON SURGERY  01/2004  x 2  COLONOSCOPY W/ BIOPSIES  04/26/2017  per [name], no polyps, benign inflammation, repeat in 5 yrs   CYSTOSCOPY    ESOPHAGOGASTRODUODENOSCOPY    HEMICOLECTOMY    left side, at [facility], diverticulitis  [place]    [phone] incisional hernia  ILEOSTOMY    ILEOSTOMY CLOSURE    INSERTION OF MESH  07/31/2012  Procedure: INSERTION OF MESH;  Surgeon: [name], MD;  Location: [facility];  Service: General;;  LAPAROTOMY  12/19/2011  Procedure: EXPLORATORY LAPAROTOMY;  Surgeon: [name], MD;  Location: [facility];  Service: General;  Laterality: N/A;  PACEMAKER IMPLANT N/A 12/01/2020  Procedure: PACEMAKER IMPLANT;  Surgeon: [name], MD;  Location: [facility];  Service: Cardiovascular;  Laterality: N/A;  PACEMAKER INSERTION Left   TONSILLECTOMY    UPPER GASTROINTESTINAL ENDOSCOPY    VASECTOMY    VENTRAL HERNIA REPAIR  07/31/2012  Procedure:  HERNIA REPAIR VENTRAL ADULT;  Surgeon: [name], MD;  Location: [facility];  Service: General;  Laterality: N/A; HPI: Pt is a 71 yo male admit 10/28 after being obtunded found down 10/30 - ? polypharmacy,  tx to ICU for intubation, 10/31 waking up and following commands overnight.  He was extubated 11/1 and failed swallow screen thus SLP eval was ordered.  He also has h/o Crohns disease - complex hx including small bowel perf -surgical history, short gut- TPN.  MRI showed No acute intracranial abnormality.  2. 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  Chest imaging concerning for right infrahilar airspace consolidation concerning  for pneumonia or aspiration.  Subjective: awake, alert, cooperative; wife present  Recommendations for follow up therapy are one component of a multi-disciplinary discharge planning process, led by the attending physician.  Recommendations may be updated based on patient status, additional functional criteria and insurance authorization. Assessment / Plan / Recommendation   05/22/2022   2:08 PM Clinical Impressions Clinical Impression Patient presents with a mild oral and a moderate pharyngeal phase dysphagia as per this MBS. In addition, questionable narrowing of cervical esophagus (no radiologist present to confirm). SLP assessed patient's toleration of the following barium consistencies: thin, nectar and honey thick liquids, puree/pudding solids. During oral phase, patient exhibited decreased anterior to posterior transit of boluses which was mostly noted with thicker liquids (honey thick, puree/pudding). Minimal amount of barium remained on lips and trace to min in oral cavity post initial swallows and with puree solids and honey thick liquids, patient exhibited piecemeal swallowing. During pharyngeal phase of swallow, patient exhibited instances of swallow initiation delay at level of vallecular sinus,  however this was not consistent or frequent. He did exhibit vallecular and posterior pharyngeal wall residuals with puree, nectar, thin and honey thick liquids, and pyrifom sinus residuals s/p sips of thin and nectar thick liquids. The amount of vallecular sinus residuals was moderate with puree solids and the amount of vallecular, posterior pharyngeal wall and pyriform sinus residuals was mild overall, clearing to trace to mild with cued dry swallows. He exhibited aspiration during the swallow (PAS 7) with cup sip of thin liquids and with patient reacting with a cough response. He exhibited penetration of nectar thick liquids via cup  sip but with cough response effective to clear penetrate. He also exhibited an instance of penetration after the swallow with nectar thick liquids from pyriform sinus residuals. With teaspoon sips of thin liquids, patient did not exhibit any instances of penetration or aspiration during or after the swallow, but did continue with pharyngeal residuals. Patient did sense penetration and aspiration with observed cough and cough was effective to clear penetrate. A few instances of retrograde movement of barium in the cervical esophagus were observed. SLP is recommending initiate PO diet of thin liquids only by spoon sips only with strict aspiration precautions to only have PO's when patient is fully awake and alert, must be sitting upright and after oral care, stop if excessive coughing observed. SLP will continue to follow patient for diet toleration and ability to upgrade (likely will need repeat MBS prior to any PO changes).     05/22/2022   2:03 PM Treatment Recommendations Treatment Recommendations Therapy as outlined in treatment plan below     05/22/2022   2:05 PM Prognosis Prognosis for Safe Diet Advancement Fair Barriers to Reach Goals Time post onset;Severity of deficits   05/22/2022   2:03 PM Diet Recommendations SLP Diet Recommendations Thin liquid Liquid Administration via Spoon  Medication Administration Other (Comment) Compensations Slow rate;Small sips/bites;Clear throat intermittently     05/22/2022   2:03 PM Other Recommendations Oral Care Recommendations Oral care BID;Staff/trained caregiver to provide oral care;Oral care before and after PO Other Recommendations Have oral suction available Follow Up Recommendations SLP at Long-term acute care hospital Assistance recommended at discharge Frequent or constant Supervision/Assistance Functional Status Assessment Patient has had a recent decline in their functional status and demonstrates the ability to make significant improvements in function in a reasonable and predictable amount of time.   05/22/2022   2:03 PM Frequency and Duration  Speech Therapy Frequency (ACUTE ONLY) min 2x/week Treatment Duration 1 week     05/22/2022   1:57 PM Oral Phase Oral Phase Impaired Oral - Nectar Cup Weak lingual manipulation Oral - Thin Teaspoon Weak lingual manipulation Oral - Thin Cup Weak lingual manipulation Oral - Puree Weak lingual manipulation;Reduced posterior propulsion;Piecemeal swallowing;Delayed oral transit    05/22/2022   2:08 PM Pharyngeal Phase Pharyngeal- Honey Cup Pharyngeal residue - valleculae;Pharyngeal residue - posterior pharnyx    05/22/2022   2:02 PM Cervical Esophageal Phase  Cervical Esophageal Phase Impaired Nectar Teaspoon Reduced cricopharyngeal relaxation Nectar Cup Reduced cricopharyngeal relaxation;Esophageal backflow into cervical esophagus Thin Teaspoon Reduced cricopharyngeal relaxation Thin Cup Reduced cricopharyngeal relaxation Puree Esophageal backflow into cervical esophagus;Reduced cricopharyngeal relaxation Cervical Esophageal Comment question narrowing of cervical esophagus (no radiologist present to confirm) Sonia Baller, MA, CCC-SLP Speech Therapy                          Scheduled Meds:  Chlorhexidine Gluconate Cloth  6 each Topical Daily   enoxaparin (LOVENOX) injection  40 mg Subcutaneous Q24H    insulin aspart  0-9 Units Subcutaneous 4 times per day   lidocaine  1 patch Transdermal Q24H   sertraline  50 mg Oral Daily   Continuous Infusions:  lactated ringers 75 mL/hr at 85/88/50 2774   TPN CYCLIC-ADULT (ION)       LOS: 11 days    Time spent: 35 minutes    Irine Seal, MD [facility]   To contact the attending provider between 7A-7P or the covering provider during after hours 7P-7A, please log into the web site  [email or web address] and access using universal [place] password for that web site. If you do not have the password, please call the hospital operator.  05/24/2022, 12:46 PM

## 2022-05-24 NOTE — Progress Notes (Signed)
Physical Therapy Treatment Patient Details Name: [name] MRN: [id number] DOB: Jun 25, 1951 Today's Date: 05/24/2022   History of Present Illness 71 year old male with complicated past medical history as outlined below, which is significant for Crohn's disease with persistent abdominal pain.  On April of this year he underwent exploratory laparotomy with small bowel resection, repair of enterotomy, and creation of a diverting loop ileostomy at [facility].  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  He has been residing in [facility] SNF from where he presented to the emergency department on 10/27 for pain at the site of his ostomy with foul-smelling discharge.  Work-up of the ostomy and abdomen including CT imaging was unremarkable.  He was discharged to SNF, however, rather than returning to SNF he found private transport to his home.Then on 10/28 he again presented to [facility] long emergency department with chief complaint of unresponsiveness.11/2 reveals that ingestion of pills at home was in fact a suicide attempt. 1:1 sitter, psych consult    PT Comments    General Comments: AxO x 3 very quiet, soft spoken, cooperative.  C/O 8/10 ABD pain and dizziness with mobility however vitals WNL.  Safety sitter in room.  Assisted OOB required increased time and + 2 assist for amb.  General bed mobility comments: assist for trunk upright and LEs over EOB used bed pad to complete scooting.  General transfer comment: + 2 side by side Mod Assist sit to stand.  Pt nervous.  Max encouragement to increase activity.  Mild c/o dizziness.  Feeling "really weak". General Gait Details: still requires + 2 asisst with a third following with recliner.  VERY limited amb distance due to MAX c/o weakness/fatigue and increased c/o dizziness along with nervousness.  Required POSITIVE re-enforcement.  AMB twice.  Fist 9 feet then another 4 feet after a brief seated rest break. Positioned in recliner to comfort. Pt will need ST  Rehab at SNF to address mobility and functional decline prior to safely returning home.    Recommendations for follow up therapy are one component of a multi-disciplinary discharge planning process, led by the attending physician.  Recommendations may be updated based on patient status, additional functional criteria and insurance authorization.  Follow Up Recommendations  Skilled nursing-short term rehab (<3 hours/day) Can patient physically be transported by private vehicle: No   Assistance Recommended at Discharge Frequent or constant Supervision/Assistance  Patient can return home with the following A little help with bathing/dressing/bathroom;Assist for transportation;Help with stairs or ramp for entrance;A lot of help with walking and/or transfers   Equipment Recommendations  None recommended by PT    Recommendations for Other Services       Precautions / Restrictions Precautions Precautions: Fall Precaution Comments: right colostomy Restrictions Weight Bearing Restrictions: No     Mobility  Bed Mobility Overal bed mobility: Needs Assistance Bed Mobility: Supine to Sit     Supine to sit: Max assist     General bed mobility comments: assist for trunk upright and LEs over EOB used bed pad to complete scooting    Transfers Overall transfer level: Needs assistance Equipment used: Rolling walker (2 wheels) Transfers: Sit to/from Stand Sit to Stand: Mod assist, +2 physical assistance           General transfer comment: + 2 side by side Mod Assist sit to stand.  Pt nervous.  Max encouragement to increase activity.  Mild c/o dizziness.  Feeling "really weak".    Ambulation/Gait Ambulation/Gait assistance: Mod assist, +2 physical  assistance, +2 safety/equipment Gait Distance (Feet): 13 Feet (9 feet, another 4 feet) Assistive device: Rolling walker (2 wheels) Gait Pattern/deviations: Step-through pattern, Trunk flexed, Decreased stride length Gait velocity:  decreased     General Gait Details: still requires + 2 asisst with a third following with recliner.  VERY limited amb distance due to MAX c/o weakness/fatigue and increased c/o dizziness along with nervousness.  Required POSITIVE re-enforcement.  AMB twice.  Fist 9 feet then another 4 feet after a brief seated rest break.   Stairs             Wheelchair Mobility    Modified Rankin (Stroke Patients Only)       Balance                                            Cognition Arousal/Alertness: Awake/alert Behavior During Therapy: WFL for tasks assessed/performed Overall Cognitive Status: Within Functional Limits for tasks assessed                                 General Comments: AxO x 3 very quiet, soft spoken, cooperative.  C/O 8/10 ABD pain and dizziness with mobility however vitals WNL        Exercises      General Comments        Pertinent Vitals/Pain Pain Assessment Pain Assessment: 0-10 Pain Score: 8  Pain Location: ABD Pain Descriptors / Indicators: Discomfort, Grimacing Pain Intervention(s): Monitored during session, Patient requesting pain meds-RN notified, Repositioned    Home Living                          Prior Function            PT Goals (current goals can now be found in the care plan section) Progress towards PT goals: Progressing toward goals    Frequency    Min 2X/week      PT Plan Current plan remains appropriate    Co-evaluation              AM-PAC PT "6 Clicks" Mobility   Outcome Measure  Help needed turning from your back to your side while in a flat bed without using bedrails?: A Lot Help needed moving from lying on your back to sitting on the side of a flat bed without using bedrails?: A Lot Help needed moving to and from a bed to a chair (including a wheelchair)?: A Lot Help needed standing up from a chair using your arms (e.g., wheelchair or bedside chair)?: A Lot Help  needed to walk in hospital room?: A Lot Help needed climbing 3-5 steps with a railing? : Total 6 Click Score: 11    End of Session Equipment Utilized During Treatment: Gait belt Activity Tolerance: Patient limited by fatigue Patient left: in chair;with call bell/phone within reach;with family/visitor present;with nursing/sitter in room Nurse Communication: Mobility status;Patient requests pain meds PT Visit Diagnosis: Difficulty in walking, not elsewhere classified (R26.2)     Time: 1607-3710 PT Time Calculation (min) (ACUTE ONLY): 20 min  Charges:  $Gait Training: 8-22 mins                     Rica Koyanagi  PTA Acute  Rehabilitation Services Office M-F  [phone] Weekend pager [phone]

## 2022-05-24 NOTE — Consult Note (Signed)
[facility] Nurse ostomy consult note Stoma type/location: RUQ ileostomy. Patient known to our service from previous admission/visits. Surgery performed at [facility] surgery was scheduled for August 2023; I do not know why that was no done. Stomal assessment/size: At last measurement, stoma is slightly less than 1 inch round, red, budded Peristomal assessment: Not seen today Treatment options for stomal/peristomal skin: skin barrier ring, convex pouch that is cut to fit stoma Ostomy pouching: 1pc.convex pouch with skin barrier ring Education provided: None Enrolled patient in [place] program: No. Patient wears a 2-piece convex Convatec pouching system when not in house. Here, we transition to our formulary product, a 1-piece compressible convex ostomy pouching system with skin barrier ring. Nursing staff are to assist with pouch emptying and twice weekly and PRN pouch changing. I have provided guidance for supply ordering, including in house [place] #s, via the Orders.  [facility] nursing team will not follow, but will remain available to this patient, the nursing and medical teams.  Please re-consult if needed.  Thank you for inviting [place] to participate in this patient's Plan of Care.  Maudie Flakes, MSN, RN, CNS, [place], Serita Grammes, [employer], [employer] phone:  [phone]

## 2022-05-25 DIAGNOSIS — J9601 Acute respiratory failure with hypoxia: Secondary | ICD-10-CM | POA: Diagnosis not present

## 2022-05-25 DIAGNOSIS — G9341 Metabolic encephalopathy: Secondary | ICD-10-CM | POA: Diagnosis not present

## 2022-05-25 DIAGNOSIS — D649 Anemia, unspecified: Secondary | ICD-10-CM | POA: Diagnosis not present

## 2022-05-25 DIAGNOSIS — F332 Major depressive disorder, recurrent severe without psychotic features: Secondary | ICD-10-CM

## 2022-05-25 DIAGNOSIS — E43 Unspecified severe protein-calorie malnutrition: Secondary | ICD-10-CM | POA: Diagnosis not present

## 2022-05-25 LAB — GLUCOSE, CAPILLARY
Glucose-Capillary: 121 mg/dL — ABNORMAL HIGH (ref 70–99)
Glucose-Capillary: 96 mg/dL (ref 70–99)
Glucose-Capillary: 110 mg/dL — ABNORMAL HIGH (ref 70–99)

## 2022-05-25 LAB — CBC WITH DIFFERENTIAL/PLATELET
Abs Immature Granulocytes: 0.1 10*3/uL — ABNORMAL HIGH (ref 0.00–0.07)
Basophils Absolute: 0 10*3/uL (ref 0.0–0.1)
Basophils Relative: 0 %
Eosinophils Absolute: 0.3 10*3/uL (ref 0.0–0.5)
Eosinophils Relative: 4 %
HCT: 29.6 % — ABNORMAL LOW (ref 39.0–52.0)
Hemoglobin: 9 g/dL — ABNORMAL LOW (ref 13.0–17.0)
Immature Granulocytes: 1 %
Lymphocytes Relative: 26 %
Lymphs Abs: 1.8 10*3/uL (ref 0.7–4.0)
MCH: 24.5 pg — ABNORMAL LOW (ref 26.0–34.0)
MCHC: 30.4 g/dL (ref 30.0–36.0)
MCV: 80.4 fL (ref 80.0–100.0)
Monocytes Absolute: 0.5 10*3/uL (ref 0.1–1.0)
Monocytes Relative: 6 %
Neutro Abs: 4.5 10*3/uL (ref 1.7–7.7)
Neutrophils Relative %: 63 %
Platelets: 248 10*3/uL (ref 150–400)
RBC: 3.68 MIL/uL — ABNORMAL LOW (ref 4.22–5.81)
RDW: 19 % — ABNORMAL HIGH (ref 11.5–15.5)
WBC: 7.1 10*3/uL (ref 4.0–10.5)
nRBC: 0 % (ref 0.0–0.2)

## 2022-05-25 LAB — COMPREHENSIVE METABOLIC PANEL
ALT: 109 U/L — ABNORMAL HIGH (ref 0–44)
AST: 63 U/L — ABNORMAL HIGH (ref 15–41)
Albumin: 2.4 g/dL — ABNORMAL LOW (ref 3.5–5.0)
Alkaline Phosphatase: 220 U/L — ABNORMAL HIGH (ref 38–126)
Anion gap: 5 (ref 5–15)
BUN: 24 mg/dL — ABNORMAL HIGH (ref 8–23)
CO2: 28 mmol/L (ref 22–32)
Calcium: 8.4 mg/dL — ABNORMAL LOW (ref 8.9–10.3)
Chloride: 107 mmol/L (ref 98–111)
Creatinine, Ser: 0.79 mg/dL (ref 0.61–1.24)
GFR, Estimated: >60 mL/min (ref >60)
Glucose, Bld: 100 mg/dL — ABNORMAL HIGH (ref 70–99)
Potassium: 3.6 mmol/L (ref 3.5–5.1)
Sodium: 140 mmol/L (ref 135–145)
Total Bilirubin: 0.5 mg/dL (ref 0.3–1.2)
Total Protein: 6.2 g/dL — ABNORMAL LOW (ref 6.5–8.1)

## 2022-05-25 LAB — MAGNESIUM: Magnesium: 1.8 mg/dL (ref 1.7–2.4)

## 2022-05-25 LAB — PHOSPHORUS: Phosphorus: 3.3 mg/dL (ref 2.5–4.6)

## 2022-05-25 LAB — TRIGLYCERIDES: Triglycerides: 105 mg/dL (ref <150)

## 2022-05-25 MED ORDER — LACTATED RINGERS IV SOLN: INTRAVENOUS | Status: DC

## 2022-05-25 MED ORDER — PANTOPRAZOLE SODIUM 40 MG PO TBEC
40.0000 mg | DELAYED_RELEASE_TABLET | Freq: Every day | ORAL | Status: DC
  Administered 2022-05-25 – 2022-06-14 (×21): 40 mg via ORAL
  Filled 2022-05-25 (×21): qty 1

## 2022-05-25 MED ORDER — OXYBUTYNIN CHLORIDE 5 MG PO TABS
5.0000 mg | ORAL_TABLET | Freq: Three times a day (TID) | ORAL | Status: DC
  Administered 2022-05-25 – 2022-05-31 (×18): 5 mg via ORAL
  Filled 2022-05-25 (×18): qty 1

## 2022-05-25 MED ORDER — MORPHINE SULFATE 15 MG PO TABS
15.0000 mg | ORAL_TABLET | Freq: Four times a day (QID) | ORAL | Status: DC | PRN
  Administered 2022-05-25 – 2022-06-03 (×23): 15 mg via ORAL
  Filled 2022-05-25 (×26): qty 1

## 2022-05-25 MED ORDER — INSULIN ASPART 100 UNIT/ML IJ SOLN
0.0000 [IU] | INTRAMUSCULAR | Status: AC
  Administered 2022-05-29: 1 [IU] via SUBCUTANEOUS
  Administered 2022-05-31: 2 [IU] via SUBCUTANEOUS

## 2022-05-25 MED ORDER — PSYLLIUM 95 % PO PACK
1.0000 | PACK | Freq: Every day | ORAL | Status: DC
  Administered 2022-05-26 – 2022-06-10 (×4): 1 via ORAL
  Filled 2022-05-25 (×20): qty 1

## 2022-05-25 MED ORDER — TAMSULOSIN HCL 0.4 MG PO CAPS
0.4000 mg | ORAL_CAPSULE | Freq: Every day | ORAL | Status: DC
  Administered 2022-05-25 – 2022-06-22 (×28): 0.4 mg via ORAL
  Filled 2022-05-25 (×29): qty 1

## 2022-05-25 MED ORDER — BUSPIRONE HCL 5 MG PO TABS
5.0000 mg | ORAL_TABLET | Freq: Two times a day (BID) | ORAL | Status: DC
  Administered 2022-05-25 – 2022-06-23 (×59): 5 mg via ORAL
  Filled 2022-05-25 (×59): qty 1

## 2022-05-25 MED ORDER — SIMETHICONE 80 MG PO CHEW
80.0000 mg | CHEWABLE_TABLET | Freq: Three times a day (TID) | ORAL | Status: DC
  Administered 2022-05-25 – 2022-06-23 (×113): 80 mg via ORAL
  Filled 2022-05-25 (×114): qty 1

## 2022-05-25 MED ORDER — ONDANSETRON 4 MG PO TBDP
4.0000 mg | ORAL_TABLET | Freq: Four times a day (QID) | ORAL | Status: DC | PRN
  Administered 2022-05-26 – 2022-05-27 (×2): 4 mg via ORAL
  Filled 2022-05-25 (×2): qty 1

## 2022-05-25 MED ORDER — PROCHLORPERAZINE EDISYLATE 10 MG/2ML IJ SOLN
10.0000 mg | Freq: Once | INTRAMUSCULAR | Status: AC
  Administered 2022-05-25: 10 mg via INTRAVENOUS
  Filled 2022-05-25: qty 2

## 2022-05-25 MED ORDER — TRAVASOL 10 % IV SOLN
INTRAVENOUS | Status: AC
  Filled 2022-05-25: qty 1344

## 2022-05-25 MED ORDER — SULFAMETHOXAZOLE-TRIMETHOPRIM 800-160 MG PO TABS
1.0000 | ORAL_TABLET | Freq: Two times a day (BID) | ORAL | Status: AC
  Administered 2022-05-25 – 2022-06-03 (×18): 1 via ORAL
  Filled 2022-05-25 (×19): qty 1

## 2022-05-25 NOTE — TOC Progression Note (Addendum)
Transition of Care [facility]) - Progression Note    Patient Details  Name: [name] MRN: [id number] Date of Birth: [birth year]  Transition of [facility]) CM/SW Contact  Jaxson Keener, Juliann Pulse, RN Phone Number: 05/25/2022, 11:32 AM  Clinical Narrative: [facility] to see if can accept gero psych w/TPN-they are unable to provide medical mgmnt-no referral sent.Not likely to get any gero psych to accept with TPN. Noted supv Beverlee Nims also following.     Expected Discharge Plan:  (TBD) Barriers to Discharge: Continued Medical Work up  Expected Discharge Plan and Services Expected Discharge Plan:  (TBD)   Discharge Planning Services: CM Consult Post Acute Care Choice: [place] Living arrangements for the past 2 months: Single Family Home                                       Social Determinants of Health (SDOH) Interventions    Readmission Risk Interventions    05/15/2022   10:53 AM  Readmission Risk Prevention Plan  Transportation Screening Complete  Medication Review (RN Care Manager) Complete  PCP or Specialist appointment within 3-5 days of discharge Complete  HRI or Home Care Consult Complete  SW Recovery Care/Counseling Consult Complete  Black Complete

## 2022-05-25 NOTE — Progress Notes (Signed)
PROGRESS NOTE    [name]  TXM:[id number] DOB: 21-Apr-1951 DOA: 05/13/2022 PCP: [name], MD    Chief Complaint  Patient presents with   Altered Mental Status    Brief Narrative:  [phone] with hx Crohn's with persistent abd pain s/p extensive surgeries and is currently TPN dependent. Pt presented with unresponsiveness in apparent suicide attempt via overdose. Pt remained unresponsive and later was unable to maintain airway, prompting intubation and transfer to ICU. Pt now extubated and care transferred to hospitalist.     Assessment & Plan:   Principal Problem:   Metabolic encephalopathy Active Problems:   Chronic anemia   Acute respiratory failure with hypoxia (HCC)   Severe malnutrition (HCC)   Generalized weakness   Drug overdose, intentional, initial encounter ([place])   Chronic pain syndrome   Aspiration pneumonia ([place])  #1 intentional drug overdose/MDD -Patient noted to have intentionally taking pain medication and anxiolytics with intentional suicidal attempt. -Patient with some clinical improvement feeling stronger tolerating current clear diet. -Sitter at bedside. -Continue Zoloft 50 mg daily. -Patient was on Prozac and switched to liquid sertraline per psychiatry. -Place back on decreased home regimen of BuSpar 5 mg twice daily. -Continue one-to-one sitter. -Psychiatry recommending inpatient psych admission when medically cleared.  2.  Toxic metabolic encephalopathy -Likely secondary to intentional medication overdose. -Patient clinically improved conversant following commands answering questions appropriately. -Head CT, MRI brain done negative for any acute abnormalities. -Continue supportive care.  3.  Aspiration pneumonia -Seen by speech therapy underwent MBS 05/22/2022 currently on clears with thickened liquids. -Status post IV Unasyn.  4.  Crohn's disease -Status with multiple surgeries in the past. -TPN dependent.  5.  Severe protein calorie  malnutrition -Currently on TPN. -Gentle hydration. -On clears. -SLP following for advancement of diet.  6.  Chronic pain -Was on morphine prior to admission currently on Toradol as needed. -Palliative care consulted for symptom management in light of history of uncontrolled pain. -Currently on Dilaudid as needed for palliative care. -Resume back MS IR 15 mg every 6 hours as needed for breakthrough pain. -Try to wean off IV Dilaudid.  7.  A-fib -Noted to be transient A-fib in the ICU that is quickly resolved. -Currently rate controlled.  8.  Chronic prostatitis -Status post IV Unasyn. -Currently on clears. -Resume home regimen Bactrim and oxybutynin.   9.  Bladder calculi -Outpatient follow-up with urology.  10.  Anemia -Hemoglobin stable at 9.0. -Follow H&H. -Transfusion threshold hemoglobin < 7.    DVT prophylaxis: Lovenox Code Status: Full Family Communication: Updated patient, wife at bedside.  Disposition: Inpatient psychiatry when medically stable.  Status is: Inpatient Remains inpatient appropriate because: Needs inpatient psychiatric admission once medically stable.   Consultants:  Neurology: Dr.Lindzen 05/15/2022 PCCM: Dr. Lake Bells 05/15/2022 Psychiatry: Sheran Fava FNP, 05/18/2022 Palliative care: Dr. Rowe Pavy 05/21/2022 Wound care RN, Phineas Douglas 05/24/2022  Procedures:  CT head 05/13/2022, 05/15/2022 CT chest abdomen and pelvis 05/12/2022 Abdominal films 05/16/2022 Chest x-ray 05/15/2022, 05/13/2022 MRI brain 05/16/2022 Right upper quadrant ultrasound 04/21/2022 EEG  Significant Hospital Events: Including procedures, antibiotic start and stop dates in addition to other pertinent events   10/28 admit obtunded found down 10/30 tx to ICU for intubation 10/31 waking up and following commands overnight.  11/1 SBT. Extubated 11/2 reveals that ingestion of pills at home was in fact a suicide attempt. 1:1 sitter, psych consult. Transfer out of ICU    Antimicrobials:  Anti-infectives (From admission, onward)    Start  Dose/Rate Route Frequency Ordered Stop   05/15/22 1830  Ampicillin-Sulbactam (UNASYN) 3 g in sodium chloride 0.9 % 100 mL IVPB        3 g 200 mL/hr over 30 Minutes Intravenous Every 6 hours 05/15/22 1821 05/20/22 1629         Subjective: Patient laying in bed.  Wife at bedside as well as sister-in-law.  No chest pain or shortness of breath.  Complains of abdominal pain states currently getting pain medication every 12 hours whereby at home was getting every 4 hours as needed.  Some complaints of nausea.  Tolerating current clear liquids.    Objective: Vitals:   05/24/22 0659 05/24/22 1350 05/24/22 2050 05/25/22 0607  BP: 128/60 119/64 126/64 (!) 128/57  Pulse: 68 70 68 70  Resp: 16 16 16 16   Temp: 97.8 F (36.6 C) 98 F (36.7 C) 97.7 F (36.5 C) 97.7 F (36.5 C)  TempSrc: Axillary Oral Oral Axillary  SpO2: 98% 96% 96% 97%  Weight:      Height:        Intake/Output Summary (Last 24 hours) at 05/25/2022 1142 Last data filed at 05/25/2022 1020 Gross per 24 hour  Intake 3743.64 ml  Output 2700 ml  Net 1043.64 ml    Filed Weights   05/13/22 1832 05/15/22 1400 05/15/22 1727  Weight: 67.2 kg 66.3 kg 64.7 kg    Examination:  General exam: NAD.  Respiratory system: CTA B anterior lung fields.  No wheezes, no crackles, no rhonchi.  Fair air movement.   Cardiovascular system: Regular rate rhythm no murmurs rubs or gallops.  No JVD.  No lower extremity edema. Gastrointestinal system: Abdomen is soft, nondistended, chronic tenderness to palpation lower abdominal region.  Positive bowel sounds.  No rebound.  No guarding.  Ileostomy bag with loose stools noted.  Central nervous system: Alert and oriented. No focal neurological deficits. Extremities: Symmetric 5 x 5 power. Skin: No rashes, lesions or ulcers Psychiatry: Judgement and insight appear fair. Mood & affect appropriate.     Data Reviewed: I  have personally reviewed following labs and imaging studies  CBC: Recent Labs  Lab 05/24/22 1044 05/25/22 0226  WBC 6.7 7.1  NEUTROABS 3.9 4.5  HGB 9.1* 9.0*  HCT 30.6* 29.6*  MCV 79.5* 80.4  PLT 231 248     Basic Metabolic Panel: Recent Labs  Lab 05/20/22 0419 05/21/22 0257 05/22/22 0257 05/24/22 1044 05/25/22 0226  NA 139 138 136 141 140  K 4.3 4.0 3.8 4.1 3.6  CL 109 110 108 108 107  CO2 23 21* 23 26 28   GLUCOSE 107* 91 96 100* 100*  BUN 31* 33* 26* 23 24*  CREATININE 1.10 1.02 0.77 0.75 0.79  CALCIUM 8.4* 8.3* 8.2* 8.6* 8.4*  MG 2.3 2.3 2.0 2.0 1.8  PHOS 5.1* 3.3 2.6  --  3.3     GFR: Estimated Creatinine Clearance: 79.8 mL/min (by C-G formula based on SCr of 0.79 mg/dL).  Liver Function Tests: Recent Labs  Lab 05/22/22 0257 05/24/22 1044 05/25/22 0226  AST 103* 74* 63*  ALT 135* 122* 109*  ALKPHOS 169* 219* 220*  BILITOT 0.5 0.6 0.5  PROT 6.0* 6.0* 6.2*  ALBUMIN 2.4* 2.5* 2.4*     CBG: Recent Labs  Lab 05/24/22 0558 05/24/22 1341 05/24/22 1549 05/24/22 1959 05/25/22 0602  GLUCAP 125* 95 83 125* 121*      Recent Results (from the past 240 hour(s))  Culture, Respiratory w Gram Stain  Status: None   Collection Time: 05/15/22  6:30 PM   Specimen: Tracheal Aspirate; Respiratory  Result Value Ref Range Status   Specimen Description   Final    TRACHEAL ASPIRATE Performed at [facility] [address], [place], [place] [address]    Special Requests   Final    NONE Performed at [facility], [place] [address] [place], [place] [address]    Gram Stain   Final    FEW WBC PRESENT,BOTH PMN AND MONONUCLEAR FEW GRAM POSITIVE RODS    Culture   Final    FEW CORYNEBACTERIUM STRIATUM Standardized susceptibility testing for this organism is not available. No Pseudomonas species isolated NO STAPHYLOCOCCUS AUREUS ISOLATED Performed at [facility] [address], [place], [place] [address]     Report Status 05/18/2022 FINAL  Final  MRSA Next Gen by PCR, Nasal     Status: Abnormal   Collection Time: 05/15/22  6:44 PM   Specimen: Nasal Mucosa; Nasal Swab  Result Value Ref Range Status   MRSA by PCR Next Gen DETECTED (A) NOT DETECTED Final    Comment: (NOTE) The GeneXpert MRSA Assay (FDA approved for NASAL specimens only), is one component of a comprehensive MRSA colonization surveillance program. It is not intended to diagnose MRSA infection nor to guide or monitor treatment for MRSA infections. Test performance is not FDA approved in patients less than 13 years old. Performed at [facility], [place] [address], [place], [place] [address]          Radiology Studies: No results found.      Scheduled Meds:  Chlorhexidine Gluconate Cloth  6 each Topical Daily   enoxaparin (LOVENOX) injection  40 mg Subcutaneous Q24H   insulin aspart  0-9 Units Subcutaneous 2 times per day   lidocaine  1 patch Transdermal Q24H   sertraline  50 mg Oral Daily   Continuous Infusions:  lactated ringers 75 mL/hr at 05/25/22 0135   lactated ringers     TPN CYCLIC-ADULT (ION) 62 mL/hr at 45/36/46 8032   TPN CYCLIC-ADULT (ION)       LOS: 12 days    Time spent: 35 minutes    Irine Seal, MD [facility]   To contact the attending provider between 7A-7P or the covering provider during after hours 7P-7A, please log into the web site [email or web address] and access using universal [place] password for that web site. If you do not have the password, please call the hospital operator.  05/25/2022, 11:42 AM

## 2022-05-25 NOTE — Progress Notes (Addendum)
Nutrition Follow-up  DOCUMENTATION CODES:  Severe malnutrition in context of chronic illness  INTERVENTION:  -Obtain new weight as feasible and adjust nutrition as appropriate -Increase TPN volume to 2426m/day and decrease IVF -Maintain current nutrition: 121.8g AA (1.88g/kg), 273g dextrose (GIR 3.961mkg/min), 61g SMOFlipids to provide 2025kcal (31kcal/kg). Over feeding can worsen LFTs -Continue to cycle TPN due to liver function -Advance diet to full liquid as clinically appropriate -When diet is advanced, provide Ensure Plus HP strawberry 1-2x/day (350kcal, 20g protein) -As po intake improves, adjust TPN to avoid over feeding. -Likely can decrease lipids to 25-28%kcal if pt will accept po supplement  NUTRITION DIAGNOSIS:  Severe Malnutrition related to chronic illness as evidenced by percent weight loss, severe fat depletion, severe muscle depletion. progressing  GOAL:  Patient will meet greater than or equal to 90% of their needs met  MONITOR:  Diet advancement, Labs, Other (Comment) (TPN)  REASON FOR ASSESSMENT:  Consult New TPN/TNA  ASSESSMENT:  Pt is a 71yo with PMH of Crohn's disease resulting in a complicated surgical history followed at [facility] to include left hemicolectomy and ileostomy with multiple other complications to include TPN dependence, chronic prostatitis, SVT, and asthma who presents with generalized weakness, acute worsening of his chronic abdominal pain and AMS.  Spoke with pharmacy this AM about TPN.  ALT and AST are trending down with cyclical TPN. However, Alk Phos remains elevated but stable. Plan to continue cycling today and increase fluid in TPN to 240094may and decrease IVF. Pt has been NPO/CLD for 12 day LOS. Due to long term TPN and no po sources of carnitine, discussed adding carnitine to TPN for lipid metabolism, however carnitine is unavailable at this time. Contacted SLP to determine if po diet will be able to be advanced. Pt can get po  carnitine from dairy products and full liquid supplements. Po choline may also be beneficial for lipid metabolism. When diet is advanced, Recommend Ensure Plus HP 1-2x/day. As diet is advanced, can consider reducing lipids in TPN to 25-28% or providing MWF. Protein, and dextrose should also be reduced to avoid over feeding as po intake improves. Recommend maintaining current macronutrients and increasing volume with fluid.  Current TPN order: 121.8g AA (1.88g/kg), 273g dextrose (GIR 3.9mg30m/min), 61g SMOFlipids, in 2100mL68m Diet Order:   Diet Order             Diet clear liquid Room service appropriate? Yes; Fluid consistency: Thin  Diet effective now                   EDUCATION NEEDS:   Education needs have been addressed  Skin:  Skin Assessment: Reviewed RN Assessment  Last BM:  11/8-ostomy  Height:  Ht Readings from Last 1 Encounters:  05/14/22 _0  (1.778 m)    Weight:  Wt Readings from Last 1 Encounters:  05/15/22 64.7 kg    BMI:  Body mass index is 20.47 kg/m.  Estimated Nutritional Needs:  Kcal:  2015-2350kcal Protein:  100-135g Fluid:  2400mL 66mliday [place])  Katie Candise BowensRD, LDN, CNSC See AMiON for contact information

## 2022-05-25 NOTE — Plan of Care (Signed)
  Problem: Activity: Goal: Risk for activity intolerance will decrease Outcome: Progressing   Problem: Pain Managment: Goal: General experience of comfort will improve Outcome: Progressing   Problem: Safety: Goal: Ability to remain free from injury will improve Outcome: Progressing   

## 2022-05-25 NOTE — Progress Notes (Signed)
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  Assessment: Patient with PMH of Crohn's disease and complex surgical history as above on chronic TPN who presented on 05/13/22 with altered mental status after spouse found patient unresponsive with multiple pill bottles open around him. Patient was transported from SNF to ED the day prior for generalized weakness and acute worsening of his chronic abdominal pain. Patient decided to return home instead of his SNF. Pharmacy consulted to resume TPN as inpatient.   Discharge from [facility] 04/19/22:  TPN pharmacist at [facility]:  Estimated Nutrient Requirements: Protein: 1.5 - 1.7 g/kg/day (98 - 104 g/day), Total kcal: 30 - 35 kcal/kg/day (1830 - 1983 kcal/day)  TPN Formulation at [place] [facility] per TPN pharmacist: [A 6.6%, D 20%, F 3.1%] @ goal rate, 65 mL/hr (TV: 1560 mL/day) provides: Protein 1.7 g/kg/day 103 g/day, Total kcal 32 kcal/kg/day 1971 kcal/day  NPC: N ratio 94   Admitted at [facility] 04/29/22-05/03/22: Nutritional Goals: TPN 80 mL/hr (provides 113 g of protein and 1943 kcals per day) RD Assessment: (10/16): 2100-2450kcal (30-35kcal/kg), Protein 105-130g (1.5-1.8g/kg) Utilize intralipid rather than SMOF as 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  [facility]: [name], PharmD, ph: [phone] 10/28 Most recent formula: Total volume 1560 mL:  Dextrose 220 g/day, Plenamine 120 g/day, SMOFlipid 50 g/day, NaCl 75 mEq, CaGluc 6 mEq, Potassium Ac 20 mEq, Potassium Phos 13.5 nM, Mag Sulfate 8 mEq, Vitamins/trace  elements  Glucose / Insulin: No hx DM. CBG well controlled while on and off TPN Electrolytes: All WNL; K, Mg trending slowly down; bicarb trending slowly up Renal: SCr stable WNL; BUN borderline elevated but stable; UOP remains good Hepatic: LFTs bumped on 11/6; still elevated but trending down; tbili, TG remain stable WNL; albumin remains low but stable I/O: ileostomy output remains stable between [id number] ml/day; +3.5L this admission - MIVF: LR @ 75 ml/hr - Pt still requiring almost 2L daily in addition to TPN (2L daily) to maintain clinical hydration per MD; ostomy does not appear to be high-output, although UOP may be contributing as well (no recent diuretics). GI Imaging: - 11/6 abd [place] - normal liver (checked d/t elevated LFTs) - 10/27 CTa/p: no significant interval change since 10/14 CT. No evidence of bowel obstruction or other complicating feature at the anastomotic site or ostomy. - 10/14 CTa/p: previous surgical changes noted (transverse-sigmoid anastomosis) with stable inflammation since 10/12 CT GI Surgeries / Procedures: n/a  Central access: prior to admission - double lumen CVC TPN start date: started PTA  Nutritional Goals: TPN provides 134 g of protein and 2200 kcals per day.  RD Assessment this admission:  11/2 - Continues on TPN at higher calorie provision to prevent further weight loss.  Estimated Needs Total Energy Estimated Needs: 2015-2350kcal Total Protein Estimated Needs: 100-135g Total Fluid Estimated Needs: 2490m (Holliday Segar)  Current Nutrition:  TPN CLD 11/6 >>   Plan:  MIVF is limiting ability to transition to 12-hr TPN cycle (prefer to keep max total fluid rate < 200 ml/hr) Increase TPN volume to 2400 ml given over 18 hour cycle RD recommends  checking serum osmolality with volume increase Electrolytes in TPN: increase K, Mg, decrease Ac Na 75 mEq/L K 45 mEq/L Ca 5 mEq/L Mg 4 mEq/L Phos 15 mmol/L Cl:Ac = max Ac Add standard MVI and trace  elements to TPN continue pepcid in TPN  With excellent CBGs, will limit finger sticks to twice daily - 2 hrs after TPN start, 1 hour after TPN stop Decrease LR to 50 ml/hr Monitor TPN labs on Mon/Thurs at minimum Bmet, serum osmolality tomorrow AM Mg ordered for 11/11   Reuel Boom, PharmD, BCPS [phone] 05/25/2022, 7:31 AM

## 2022-05-25 NOTE — Progress Notes (Signed)
Speech Language Pathology Treatment: Dysphagia  Patient Details Name: [name] MRN: [id number] DOB: 03/26/1951 Today's Date: 05/25/2022 Time: 1410-1500 SLP Time Calculation (min) (ACUTE ONLY): 50 min  Assessment / Plan / Recommendation Clinical Impression  Pt seen per wife's request to determine readiness for advancement.  His positioning is suboptimal and he required being slid up twice during session due to sliding down from discomfort.  Voice and cough are much stronger but is still not baseline per pt and his wife.  SLP observed him self feeding graham cracker, applesauce,  Italian ice, and thin water.  Much improved mastication and oral transiting with solids/liquids noted.  He continued with moderately strong cough immediately post-swallow of thin via cup- and his report of sensing retention in his trachea.  Cough was not productive and expectoration attempts not effective.  After solids, pt did sense pharyngeal retention- pointing to vallecular region - which is where residuals noted on MBS.    Tsp of liquids facilitated sensation of clearance.  Pt admits to premorbid occasional sensation of pharyngeal retention with po.   Recommend advance diet to dys3/thin - liquids via tsp.  Wife and pt report many modifications needed for diet - thus advised them to order ALL of his meals.  Put restrictions in diet comments in hopes they will be followed.      Pt and wife educated to recommendations using teach back with mod cues.  Pt has made excellent progress.      HPI HPI: Pt is a 71 yo male admit 10/28 after being obtunded found down 10/30 - ? polypharmacy,  tx to ICU for intubation, 10/31 waking up and following commands overnight.  He was extubated 11/1 and failed swallow screen thus SLP eval was ordered.  He also has h/o Crohns disease - complex hx including small bowel perf -surgical history, short gut- TPN.  MRI showed No acute intracranial abnormality.  2. 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  Chest imaging concerning for right infrahilar airspace consolidation concerning  for pneumonia or aspiration.      SLP Plan  MBS;Continue with current plan of care      Recommendations for follow up therapy are one component of a multi-disciplinary discharge planning process, led by the attending physician.  Recommendations may be updated based on patient status, additional functional criteria and insurance authorization.    Recommendations  Diet recommendations: Dysphagia 3 (mechanical soft);Thin liquid Liquids provided via: Teaspoon;No straw Medication Administration: Whole meds with puree Compensations: Slow rate;Small sips/bites;Clear throat intermittently Postural Changes and/or Swallow Maneuvers: Seated upright 90 degrees;Upright 30-60 min after meal                Oral Care Recommendations: Oral care prior to ice chip/H20 Follow Up Recommendations: SLP at [facility] acute [facility] hospital Assistance recommended at discharge: Frequent or constant Supervision/Assistance SLP Visit Diagnosis: Dysphagia, unspecified (R13.10) Plan: MBS;Continue with current plan of care          Kathleen Lime, MS [place] Office [phone] Pager [phone]  [name]  05/25/2022, 3:19 PM

## 2022-05-26 DIAGNOSIS — G9341 Metabolic encephalopathy: Secondary | ICD-10-CM | POA: Diagnosis not present

## 2022-05-26 DIAGNOSIS — R401 Stupor: Secondary | ICD-10-CM | POA: Diagnosis not present

## 2022-05-26 DIAGNOSIS — D649 Anemia, unspecified: Secondary | ICD-10-CM | POA: Diagnosis not present

## 2022-05-26 DIAGNOSIS — E43 Unspecified severe protein-calorie malnutrition: Secondary | ICD-10-CM | POA: Diagnosis not present

## 2022-05-26 DIAGNOSIS — J9601 Acute respiratory failure with hypoxia: Secondary | ICD-10-CM | POA: Diagnosis not present

## 2022-05-26 LAB — CBC WITH DIFFERENTIAL/PLATELET
Abs Immature Granulocytes: 0.07 10*3/uL (ref 0.00–0.07)
Basophils Absolute: 0 10*3/uL (ref 0.0–0.1)
Basophils Relative: 0 %
Eosinophils Absolute: 0.2 10*3/uL (ref 0.0–0.5)
Eosinophils Relative: 3 %
HCT: 28.1 % — ABNORMAL LOW (ref 39.0–52.0)
Hemoglobin: 8.5 g/dL — ABNORMAL LOW (ref 13.0–17.0)
Immature Granulocytes: 1 %
Lymphocytes Relative: 20 %
Lymphs Abs: 1.5 10*3/uL (ref 0.7–4.0)
MCH: 24.4 pg — ABNORMAL LOW (ref 26.0–34.0)
MCHC: 30.2 g/dL (ref 30.0–36.0)
MCV: 80.5 fL (ref 80.0–100.0)
Monocytes Absolute: 0.3 10*3/uL (ref 0.1–1.0)
Monocytes Relative: 4 %
Neutro Abs: 5.2 10*3/uL (ref 1.7–7.7)
Neutrophils Relative %: 72 %
Platelets: 205 10*3/uL (ref 150–400)
RBC: 3.49 MIL/uL — ABNORMAL LOW (ref 4.22–5.81)
RDW: 19 % — ABNORMAL HIGH (ref 11.5–15.5)
WBC: 7.3 10*3/uL (ref 4.0–10.5)
nRBC: 0 % (ref 0.0–0.2)

## 2022-05-26 LAB — BASIC METABOLIC PANEL
Anion gap: 5 (ref 5–15)
BUN: 26 mg/dL — ABNORMAL HIGH (ref 8–23)
CO2: 28 mmol/L (ref 22–32)
Calcium: 8.5 mg/dL — ABNORMAL LOW (ref 8.9–10.3)
Chloride: 107 mmol/L (ref 98–111)
Creatinine, Ser: 0.83 mg/dL (ref 0.61–1.24)
GFR, Estimated: >60 mL/min (ref >60)
Glucose, Bld: 108 mg/dL — ABNORMAL HIGH (ref 70–99)
Potassium: 4 mmol/L (ref 3.5–5.1)
Sodium: 140 mmol/L (ref 135–145)

## 2022-05-26 LAB — GLUCOSE, CAPILLARY
Glucose-Capillary: 120 mg/dL — ABNORMAL HIGH (ref 70–99)
Glucose-Capillary: 93 mg/dL (ref 70–99)
Glucose-Capillary: 116 mg/dL — ABNORMAL HIGH (ref 70–99)

## 2022-05-26 LAB — OSMOLALITY: Osmolality: 298 mOsm/kg — ABNORMAL HIGH (ref 275–295)

## 2022-05-26 MED ORDER — TRAVASOL 10 % IV SOLN
INTRAVENOUS | Status: AC
  Filled 2022-05-26: qty 1312.5

## 2022-05-26 NOTE — Consult Note (Signed)
Face-to-Face Psychiatry Consult   Reason for Consult:  Suicide Attempt  Referring Physician:  Red Christians   Patient Identification: [name] MRN:  [id number] Principal Diagnosis: Metabolic encephalopathy Diagnosis:  Principal Problem:   Metabolic encephalopathy Active Problems:   Chronic anemia   Acute respiratory failure with hypoxia (HCC)   Severe malnutrition (HCC)   Generalized weakness   Drug overdose, intentional, initial encounter ([place])   Chronic pain syndrome   Aspiration pneumonia ([place])   Total Time spent with patient: 20 minutes  Subjective:   71yo with hx Crohn's with persistent abd pain s/p extensive surgeries and is currently TPN dependent. Pt presented with unresponsiveness in apparent suicide attempt via overdose. Pt remained unresponsive and later was unable to maintain airway, prompting intubation and transfer to ICU. Pt now extubated and care transferred to hospitalist.   HPI: Currently on interview, the patient. Alert and oriented x4. Patient reports presenting to the emergency department after a suicide attempt via after taking multiple pills in an attempt to end his life. Over the past couple months he reports worsening depression and anxiety. He reports symptoms of depression including hopelessness, guilt/worthlessness, disturbed sleep pattern (sleeping 3 to 4 hours during the day) loss of interest, low energy, decreased appetite, isolative, crying spells deterioration of ADLs and recurrent thoughts of death.  He reports that his symptoms have increased due to multiple stomach surgeries, deconditioning, and increase in pain. Patient is on TPN to improve nutrition, assist with weight gain. He is also on a dysphagia 3 diet at this time, with no assistance required to feed himself. Patient has met his medically goals and now medically stable for transfer to inpatient psychiatric unit.   Patient does not appear acutely manic on exam and he does not elicit or endorse  any current psychotic symptoms. Patient currently denies SI/HI/AVH.  Recommend inpatient psychiatric hospitalization for crisis stabilization to address symptoms of depression, anxiety, and suicidal ideations. He reports not having any suicidal thoughts since he attempted. He continues to deny suicidal ideations at this time. Patient agrees to voluntary admission at this time.  He endorses ongoing depression symptoms, yet remains hopeful to be placed inpatient psychiatric soon.  He continues to show remorse and regret for his suicide attempt.  Psychiatric consult service will continue to monitor at this time. Pt remains at severe risk of suicide without inpatient psychiatric admission and treatment.  Past Psychiatric History: Depression  Risk to Self:   yes suicide attempt Risk to Others:   Denies Prior Inpatient Therapy:   Denies Prior Outpatient Therapy:   Denies  Past Medical History:  Past Medical History:  Diagnosis Date   Acute prostatitis 07/24/2007   Qualifier: Diagnosis of  By: [name] MD, [name]    Allergy    mild   Arthritis    osteoarthritis   Asthma    Blood transfusion without reported diagnosis    BPH (benign prostatic hypertrophy) with urinary obstruction    Crohn's ileitis ([place]) suspected 05/03/2017   Dilated aortic root ([place])    noted on echo 08/2012   Diverticulitis of colon    EPIDIDYMITIS 02/15/2010   Qualifier: Diagnosis of  By: [name] MD, [name]    GERD (gastroesophageal reflux disease)    H/O: GI bleed    Hemorrhoids    Hepatitis 1975   unknown type    HERPES SIMPLEX INFECTION 10/14/2007   Qualifier: Diagnosis of  By: [name] MD, [name]    Hiatal hernia    Ileus  following gastrointestinal surgery ([place]) 12/26/2011   Long term (current) use of systemic steroids 06/18/2018   Psoriasis    sees [name] at [facility].   Recurrent ventral incisional hernia 05/10/2012   SVT (supraventricular tachycardia)    Ulcer 08/21/2013   ileal    Past Surgical  History:  Procedure Laterality Date   BOWEL RESECTION  12/19/2011   Procedure: SMALL BOWEL RESECTION;  Surgeon: [name], MD;  Location: [facility];  Service: General;  Laterality: N/A;  with anastamosis and insertion mesh   BRONCHOSCOPY     COLON SURGERY  01/2004   x 2   COLONOSCOPY W/ BIOPSIES  04/26/2017   per [name], no polyps, benign inflammation, repeat in 5 yrs    CYSTOSCOPY     ESOPHAGOGASTRODUODENOSCOPY     HEMICOLECTOMY     left side, at [facility], diverticulitis   [place]     [phone] incisional hernia   ILEOSTOMY     ILEOSTOMY CLOSURE     INSERTION OF MESH  07/31/2012   Procedure: INSERTION OF MESH;  Surgeon: [name], MD;  Location: [facility];  Service: General;;   LAPAROTOMY  12/19/2011   Procedure: EXPLORATORY LAPAROTOMY;  Surgeon: [name], MD;  Location: [facility];  Service: General;  Laterality: N/A;   PACEMAKER IMPLANT N/A 12/01/2020   Procedure: PACEMAKER IMPLANT;  Surgeon: [name], MD;  Location: [place] [facility];  Service: Cardiovascular;  Laterality: N/A;   PACEMAKER INSERTION Left    TONSILLECTOMY     UPPER GASTROINTESTINAL ENDOSCOPY     VASECTOMY     VENTRAL HERNIA REPAIR  07/31/2012   Procedure: HERNIA REPAIR VENTRAL ADULT;  Surgeon: [name], MD;  Location: [facility];  Service: General;  Laterality: N/A;   Family History:  Family History  Problem Relation Age of Onset   Lung cancer Mother    Leukemia Father    Hypertension Father    Heart disease Father    Heart attack Father    Prostate cancer Father    Prostate cancer Paternal Uncle    Colon cancer Neg Hx    Stomach cancer Neg Hx    Colon polyps Neg Hx    Esophageal cancer Neg Hx    Rectal cancer Neg Hx    Family Psychiatric  History: none reported  Social History:  Social History   Substance and Sexual Activity  Alcohol Use Not Currently   Comment: occ     Social History   Substance and Sexual Activity   Drug Use Yes   Types: Oxycodone    Social History   Socioeconomic History   Marital status: Married    Spouse name: Not on file   Number of children: 2   Years of education: Not on file   Highest education level: Not on file  Occupational History   Occupation: EHS Freight forwarder    Employer: [employer]  Tobacco Use   Smoking status: Never   Smokeless tobacco: Never  Vaping Use   Vaping Use: Never used  Substance and Sexual Activity   Alcohol use: Not Currently    Comment: occ   Drug use: Yes    Types: Oxycodone   Sexual activity: Not on file  Other Topics Concern   Not on file  Social History Narrative   He is married with 2 sons 1 son is an Art gallery manager and 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 and 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   He is Nurse, 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 [place]   Rare if any caffeine   Rare alcohol and never smoker   Social Determinants of Radio broadcast assistant Strain: Low Risk  (04/10/2022)   Overall Financial Resource Strain (CARDIA)    Difficulty of Paying Living Expenses: Not hard at all  Food Insecurity: No Food Insecurity (05/14/2022)   Hunger Vital Sign    Worried About Running Out of Food in the Last Year: Never true    [place] in the Last Year: Never true  Transportation Needs: No Transportation Needs (05/14/2022)   PRAPARE - Hydrologist (Medical): No    Lack of Transportation (Non-Medical): No  Physical Activity: Inactive (04/10/2022)   Exercise Vital Sign    Days of Exercise per Week: 0 days    Minutes of Exercise per Session: 0 min  Stress: Stress Concern Present (04/10/2022)   [place]    Feeling of Stress : To some extent  Social Connections: Not on file   Additional Social History:    Allergies:   Allergies  Allergen Reactions   Purinethol  [Mercaptopurine] Other (See Comments)    Pancreatitis    Shellfish Allergy Anaphylaxis and Swelling    Can use standard SMOF lipid formulation for TPN without any issue.    Humira [Adalimumab] Other (See Comments)    Developed antibodies   Tape Rash   Wound Dressing Adhesive Rash    Labs:  Results for orders placed or performed during the hospital encounter of 05/13/22 (from the past 48 hour(s))  Glucose, capillary     Status: None   Collection Time: 05/24/22  1:41 PM  Result Value Ref Range   Glucose-Capillary 95 70 - 99 mg/dL    Comment: Glucose reference range applies only to samples taken after fasting for at least 8 hours.  Glucose, capillary     Status: None   Collection Time: 05/24/22  3:49 PM  Result Value Ref Range   Glucose-Capillary 83 70 - 99 mg/dL    Comment: Glucose reference range applies only to samples taken after fasting for at least 8 hours.  Glucose, capillary     Status: Abnormal   Collection Time: 05/24/22  7:59 PM  Result Value Ref Range   Glucose-Capillary 125 (H) 70 - 99 mg/dL    Comment: Glucose reference range applies only to samples taken after fasting for at least 8 hours.  Comprehensive metabolic panel     Status: Abnormal   Collection Time: 05/25/22  2:26 AM  Result Value Ref Range   Sodium 140 135 - 145 mmol/L   Potassium 3.6 3.5 - 5.1 mmol/L   Chloride 107 98 - 111 mmol/L   CO2 28 22 - 32 mmol/L   Glucose, Bld 100 (H) 70 - 99 mg/dL    Comment: Glucose reference range applies only to samples taken after fasting for at least 8 hours.   BUN 24 (H) 8 - 23 mg/dL   Creatinine, Ser 0.79 0.61 - 1.24 mg/dL   Calcium 8.4 (L) 8.9 - 10.3 mg/dL   Total Protein 6.2 (L) 6.5 - 8.1 g/dL   Albumin 2.4 (L) 3.5 - 5.0 g/dL   AST 63 (H) 15 - 41 U/L   ALT 109 (H) 0 - 44 U/L   Alkaline  Phosphatase 220 (H) 38 - 126 U/L   Total Bilirubin 0.5 0.3 - 1.2 mg/dL   GFR, Estimated >60 >60 mL/min    Comment: (NOTE) Calculated using the CKD-EPI Creatinine Equation  (2021)    Anion gap 5 5 - 15    Comment: Performed at [facility], [place] [address], [place], [place] [address]  Magnesium     Status: None   Collection Time: 05/25/22  2:26 AM  Result Value Ref Range   Magnesium 1.8 1.7 - 2.4 mg/dL    Comment: Performed at [facility], [place] [address] [place], [place] [address]  Phosphorus     Status: None   Collection Time: 05/25/22  2:26 AM  Result Value Ref Range   Phosphorus 3.3 2.5 - 4.6 mg/dL    Comment: Performed at [facility], [place] [address], [place], [place] [address]  Triglycerides     Status: None   Collection Time: 05/25/22  2:26 AM  Result Value Ref Range   Triglycerides 105 <150 mg/dL    Comment: Performed at [facility], [place] [address], [place], [place] [address]  CBC with Differential/Platelet     Status: Abnormal   Collection Time: 05/25/22  2:26 AM  Result Value Ref Range   WBC 7.1 4.0 - 10.5 K/uL   RBC 3.68 (L) 4.22 - 5.81 MIL/uL   Hemoglobin 9.0 (L) 13.0 - 17.0 g/dL   HCT 29.6 (L) 39.0 - 52.0 %   MCV 80.4 80.0 - 100.0 fL   MCH 24.5 (L) 26.0 - 34.0 pg   MCHC 30.4 30.0 - 36.0 g/dL   RDW 19.0 (H) 11.5 - 15.5 %   Platelets 248 150 - 400 K/uL   nRBC 0.0 0.0 - 0.2 %   Neutrophils Relative % 63 %   Neutro Abs 4.5 1.7 - 7.7 K/uL   Lymphocytes Relative 26 %   Lymphs Abs 1.8 0.7 - 4.0 K/uL   Monocytes Relative 6 %   Monocytes Absolute 0.5 0.1 - 1.0 K/uL   Eosinophils Relative 4 %   Eosinophils Absolute 0.3 0.0 - 0.5 K/uL   Basophils Relative 0 %   Basophils Absolute 0.0 0.0 - 0.1 K/uL   Immature Granulocytes 1 %   Abs Immature Granulocytes 0.10 (H) 0.00 - 0.07 K/uL    Comment: Performed at [facility], [place] [address], [place], [place] [address]  Glucose, capillary     Status: Abnormal   Collection Time: 05/25/22  6:02 AM  Result Value Ref Range   Glucose-Capillary 121 (H) 70 - 99 mg/dL    Comment: Glucose reference range  applies only to samples taken after fasting for at least 8 hours.  Glucose, capillary     Status: None   Collection Time: 05/25/22  1:20 PM  Result Value Ref Range   Glucose-Capillary 96 70 - 99 mg/dL    Comment: Glucose reference range applies only to samples taken after fasting for at least 8 hours.  Glucose, capillary     Status: Abnormal   Collection Time: 05/25/22  8:19 PM  Result Value Ref Range   Glucose-Capillary 110 (H) 70 - 99 mg/dL    Comment: Glucose reference range applies only to samples taken after fasting for at least 8 hours.  Osmolality     Status: Abnormal   Collection Time: 05/26/22  4:41 AM  Result Value Ref Range   Osmolality 298 (H) 275 - 295 mOsm/kg    Comment:  Performed at [facility], [place] [place], [place] [address]  Basic metabolic panel     Status: Abnormal   Collection Time: 05/26/22  4:41 AM  Result Value Ref Range   Sodium 140 135 - 145 mmol/L   Potassium 4.0 3.5 - 5.1 mmol/L   Chloride 107 98 - 111 mmol/L   CO2 28 22 - 32 mmol/L   Glucose, Bld 108 (H) 70 - 99 mg/dL    Comment: Glucose reference range applies only to samples taken after fasting for at least 8 hours.   BUN 26 (H) 8 - 23 mg/dL   Creatinine, Ser 0.83 0.61 - 1.24 mg/dL   Calcium 8.5 (L) 8.9 - 10.3 mg/dL   GFR, Estimated >60 >60 mL/min    Comment: (NOTE) Calculated using the CKD-EPI Creatinine Equation (2021)    Anion gap 5 5 - 15    Comment: Performed at [facility], [place] [address], [place], [place] [address]  CBC with Differential/Platelet     Status: Abnormal   Collection Time: 05/26/22  4:41 AM  Result Value Ref Range   WBC 7.3 4.0 - 10.5 K/uL   RBC 3.49 (L) 4.22 - 5.81 MIL/uL   Hemoglobin 8.5 (L) 13.0 - 17.0 g/dL   HCT 28.1 (L) 39.0 - 52.0 %   MCV 80.5 80.0 - 100.0 fL   MCH 24.4 (L) 26.0 - 34.0 pg   MCHC 30.2 30.0 - 36.0 g/dL   RDW 19.0 (H) 11.5 - 15.5 %   Platelets 205 150 - 400 K/uL   nRBC 0.0 0.0 - 0.2 %   Neutrophils Relative %  72 %   Neutro Abs 5.2 1.7 - 7.7 K/uL   Lymphocytes Relative 20 %   Lymphs Abs 1.5 0.7 - 4.0 K/uL   Monocytes Relative 4 %   Monocytes Absolute 0.3 0.1 - 1.0 K/uL   Eosinophils Relative 3 %   Eosinophils Absolute 0.2 0.0 - 0.5 K/uL   Basophils Relative 0 %   Basophils Absolute 0.0 0.0 - 0.1 K/uL   Immature Granulocytes 1 %   Abs Immature Granulocytes 0.07 0.00 - 0.07 K/uL    Comment: Performed at [facility], [place] [address] [place], [place] [address]    Current Facility-Administered Medications  Medication Dose Route Frequency Provider Last Rate Last Admin   acetaminophen (TYLENOL) suppository 325 mg  325 mg Rectal Q4H PRN Cherene Altes, MD   325 mg at 05/23/22 1124   busPIRone (BUSPAR) tablet 5 mg  5 mg Oral BID Eugenie Filler, MD   5 mg at 05/26/22 1023   Chlorhexidine Gluconate Cloth 2 % PADS 6 each  6 each Topical Daily Cherene Altes, MD   6 each at 05/26/22 1023   diphenhydrAMINE (BENADRYL) injection 12.5 mg  12.5 mg Intravenous QHS PRN Donne Hazel, MD   12.5 mg at 05/21/22 0046   enoxaparin (LOVENOX) injection 40 mg  40 mg Subcutaneous Q24H Joette Catching T, MD   40 mg at 05/25/22 2142   HYDROmorphone (DILAUDID) injection 0.5 mg  0.5 mg Intravenous Q6H PRN Loistine Chance, MD   0.5 mg at 05/26/22 0700   insulin aspart (novoLOG) injection 0-9 Units  0-9 Units Subcutaneous 2 times per day Polly Cobia, RPH       lactated ringers infusion   Intravenous Continuous Polly Cobia, RPH 50 mL/hr at 05/25/22 1759 New Bag at 05/25/22 1759   lidocaine (LIDODERM) 5 % 1 patch  1 patch Transdermal Q24H Cristal Generous, NP   1 patch at 05/25/22 1724   morphine (MSIR) tablet 15 mg  15 mg Oral Q6H PRN Eugenie Filler, MD   15 mg at 05/26/22 1028   ondansetron (ZOFRAN) injection 4 mg  4 mg Intravenous Q6H PRN Cherene Altes, MD   4 mg at 05/25/22 1617   ondansetron (ZOFRAN-ODT) disintegrating tablet 4 mg  4 mg Oral Q6H PRN Eugenie Filler, MD        Oral care mouth rinse  15 mL Mouth Rinse PRN Candee Furbish, MD       oxybutynin (DITROPAN) tablet 5 mg  5 mg Oral TID Eugenie Filler, MD   5 mg at 05/26/22 1022   pantoprazole (PROTONIX) EC tablet 40 mg  40 mg Oral Daily Eugenie Filler, MD   40 mg at 05/26/22 1023   psyllium (HYDROCIL/METAMUCIL) 1 packet  1 packet Oral Daily Eugenie Filler, MD   1 packet at 05/26/22 1040   sertraline (ZOLOFT) tablet 50 mg  50 mg Oral Daily Suella Broad, FNP   50 mg at 05/26/22 1023   simethicone (MYLICON) chewable tablet 80 mg  80 mg Oral TID WC & HS Eugenie Filler, MD   80 mg at 05/26/22 1153   sodium chloride flush (NS) 0.9 % injection 10-40 mL  10-40 mL Intracatheter PRN Cherene Altes, MD   20 mL at 05/23/22 1733   sulfamethoxazole-trimethoprim (BACTRIM DS) 800-160 MG per tablet 1 tablet  1 tablet Oral Q12H Eugenie Filler, MD   1 tablet at 05/26/22 1040   tamsulosin (FLOMAX) capsule 0.4 mg  0.4 mg Oral QPC supper Eugenie Filler, MD   0.4 mg at 94/85/46 2703   TPN CYCLIC-ADULT (ION)   Intravenous Cyclic-See Admin Instructions Polly Cobia, [facility]        Musculoskeletal: Strength & Muscle Tone: Laying in bed   Gait & Station: Laying in bed   Patient leans: Laying in bed      Psychiatric Specialty Exam: Psychiatric Specialty Exam: Physical Exam Vitals and nursing note reviewed.  Constitutional:      Appearance: Normal appearance. He is normal weight.  Skin:    General: Skin is warm.     Capillary Refill: Capillary refill takes less than 2 seconds.  Neurological:     General: No focal deficit present.     Mental Status: He is alert and oriented to person, place, and time. Mental status is at baseline.  Psychiatric:        Attention and Perception: Attention and perception normal.        Mood and Affect: Affect normal. Mood is anxious and depressed.        Speech: Speech normal.        Behavior: Behavior normal. Behavior is cooperative.        Thought  Content: Thought content normal.        Cognition and Memory: Cognition normal.        Judgment: Judgment normal.     Review of Systems  Psychiatric/Behavioral:  Positive for depression and suicidal ideas (denies). Negative for hallucinations and substance abuse. The patient is nervous/anxious. The patient does not have insomnia.   All other systems reviewed and are negative.   Blood pressure (!) 104/54, pulse 83, temperature 97.8 F (36.6 C), temperature source Oral, resp. rate 18, height _0  (1.778 m), weight 68.1 kg, SpO2 98 %.Body mass index is  21.54 kg/m.  General Appearance: Fairly Groomed  Eye Contact:  Good  Speech:  Clear and Coherent and Normal Rate  Volume:  Normal  Mood:  Anxious and Depressed  Affect:  Appropriate and Congruent  Thought Process:  Coherent, Linear, and Descriptions of Associations: Intact  Orientation:  Full (Time, Place, and Person)  Thought Content:  Logical  Suicidal Thoughts:  No  Homicidal Thoughts:  No  Memory:  Immediate;   Fair Recent;   Fair  Judgement:  Fair  Insight:  Fair  Psychomotor Activity:  Normal  Concentration:  Concentration: Fair and Attention Span: Fair  Recall:  [employer] of Knowledge:  Good  Language:  Good  Akathisia:  No  Handed:  Right  AIMS (if indicated):     Assets:  Communication Skills Desire for Improvement Financial Resources/Insurance Leisure Time Resilience Social Support  ADL's:  Intact  Cognition:  WNL  Sleep:         Physical Exam: Physical Exam Vitals and nursing note reviewed.  Constitutional:      Appearance: Normal appearance. He is normal weight.  Skin:    General: Skin is warm.     Capillary Refill: Capillary refill takes less than 2 seconds.  Neurological:     General: No focal deficit present.     Mental Status: He is alert and oriented to person, place, and time. Mental status is at baseline.  Psychiatric:        Attention and Perception: Attention and perception normal.         Mood and Affect: Affect normal. Mood is anxious and depressed.        Speech: Speech normal.        Behavior: Behavior normal. Behavior is cooperative.        Thought Content: Thought content normal.        Cognition and Memory: Cognition normal.        Judgment: Judgment normal.    Review of Systems  Psychiatric/Behavioral:  Positive for depression and suicidal ideas (denies). Negative for hallucinations and substance abuse. The patient is nervous/anxious. The patient does not have insomnia.   All other systems reviewed and are negative.  pt asleep unable to assess  Blood pressure (!) 104/54, pulse 83, temperature 97.8 F (36.6 C), temperature source Oral, resp. rate 18, height _0  (1.778 m), weight 68.1 kg, SpO2 98 %. Body mass index is 21.54 kg/m.  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 daily.   -Pt can have access to his cell phone when family is present.   Disposition: Recommend psychiatric Inpatient admission when medically cleared.  Suella Broad, FNP 05/26/2022 12:52 PM

## 2022-05-26 NOTE — Progress Notes (Signed)
Speech Language Pathology Treatment: Dysphagia  Patient Details Name: [name] MRN: [id number] DOB: 1951/06/12 Today's Date: 05/26/2022 Time: 0569-7948 SLP Time Calculation (min) (ACUTE ONLY): 52 min  Assessment / Plan / Recommendation Clinical Impression  Pt seen today for skilled SLP to assure tolerating po diet and for swallow education.  Pt lying in bed, scooted to bottom of bed and son present states he has been doing this since he was at [facility] for therapy.  Advised he be upright for po and for 30 minutes after for airway protection.   Observed pt consuming sugar cookie, cranberry juice and thin water.  No indication despite pt consuming sequential liquid boluses. Cough and voice continue to get stronger, although pt reports they are still not at baseline.    Pt consumed only jello and broth for lunch despite being on solid food diet. Encouraged him to consume solids and obtain adequate protein to help himself heal.   Using demonstration, teach back, written instructions - pt demonstrated maximizing vocal strength, cough and "hock".  He had made excellent progress and is in much better spirits today.    Will follow up next week to continue to address dysphagia and pharyngeal muscular exercises.  Son present and educate as well.     HPI HPI: Pt is a 71 yo male admit 10/28 after being obtunded found down 10/30 - ? polypharmacy,  tx to ICU for intubation, 10/31 waking up and following commands overnight.  He was extubated 11/1 and failed swallow screen thus SLP eval was ordered.  He also has h/o Crohns disease - complex hx including small bowel perf -surgical history, short gut- TPN.  MRI showed No acute intracranial abnormality.  2. 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  Chest imaging concerning for right infrahilar airspace consolidation concerning  for pneumonia or aspiration.      SLP Plan   MBS;Continue with current plan of care      Recommendations for follow up therapy are one component of a multi-disciplinary discharge planning process, led by the attending physician.  Recommendations may be updated based on patient status, additional functional criteria and insurance authorization.    Recommendations  Diet recommendations: Dysphagia 3 (mechanical soft);Thin liquid Liquids provided via: Teaspoon;No straw Medication Administration: Whole meds with puree Compensations: Slow rate;Small sips/bites;Clear throat intermittently Postural Changes and/or Swallow Maneuvers: Seated upright 90 degrees;Upright 30-60 min after meal                Oral Care Recommendations: Oral care prior to ice chip/H20 Follow Up Recommendations: SLP at Long-term acute care hospital Assistance recommended at discharge: Frequent or constant Supervision/Assistance SLP Visit Diagnosis: Dysphagia, unspecified (R13.10) Plan: MBS;Continue with current plan of care          Kathleen Lime, MS [place] Office [phone] Pager [phone]  Macario Golds  05/26/2022, 5:15 PM

## 2022-05-26 NOTE — Progress Notes (Signed)
I attempted follow up with [name], but his son was present and I didn't want to interrupt family time.  [name] was very appreciative that I had come and asked me to return a different day.  [address], [place] Pager, [phone]

## 2022-05-26 NOTE — Progress Notes (Signed)
PROGRESS NOTE    [name]  DXI:[id number] DOB: 12/25/1950 DOA: 05/13/2022 PCP: [name], MD    Chief Complaint  Patient presents with   Altered Mental Status    Brief Narrative:  [phone] with hx Crohn's with persistent abd pain s/p extensive surgeries and is currently TPN dependent. Pt presented with unresponsiveness in apparent suicide attempt via overdose. Pt remained unresponsive and later was unable to maintain airway, prompting intubation and transfer to ICU. Pt now extubated and care transferred to hospitalist.     Assessment & Plan:   Principal Problem:   Metabolic encephalopathy Active Problems:   Chronic anemia   Acute respiratory failure with hypoxia (HCC)   Severe malnutrition (HCC)   Generalized weakness   Drug overdose, intentional, initial encounter ([place])   Chronic pain syndrome   Aspiration pneumonia ([place])  #1 intentional drug overdose/MDD -Patient noted to have intentionally taking pain medication and anxiolytics with intentional suicidal attempt. -Patient with some clinical improvement feeling stronger tolerating current clear diet. -Sitter at bedside. -Continue Zoloft 50 mg daily. -Patient was on Prozac and switched to liquid sertraline per psychiatry. -Patient started back on reduced home regimen of BuSpar 5 mg twice daily.  -Continue one-to-one sitter. -Psychiatry recommending inpatient psych admission when medically cleared. -Patient currently medically stable. -Psychiatry following pending admission to inpatient psychiatric facility.  2.  Toxic metabolic encephalopathy -Likely secondary to intentional medication overdose. -Patient clinically improved conversant following commands answering questions appropriately. -Head CT, MRI brain done negative for any acute abnormalities. -Continue supportive care.  3.  Aspiration pneumonia -Seen by speech therapy underwent MBS 05/22/2022 currently on clears with thickened liquids. -Status post IV  Unasyn.  4.  Crohn's disease -Status with multiple surgeries in the past. -TPN dependent.  5.  Severe protein calorie malnutrition -Currently on TPN. -Gentle hydration. -Diet advanced to dysphagia 3 diet which she seems to be tolerating. -SLP following.  6.  Chronic pain -Was on morphine prior to admission currently on Toradol as needed. -Palliative care consulted for symptom management in light of history of uncontrolled pain. -Currently on Dilaudid as needed for palliative care. -Patient placed back on home regimen MS IR 15 mg every 6 hours as needed for breakthrough pain. -Try to wean off IV Dilaudid.  7.  A-fib -Noted to be transient A-fib in the ICU that is quickly resolved. -Currently rate controlled.  8.  Chronic prostatitis -Status post IV Unasyn. -Currently on clears. -Continue home regimen Bactrim and oxybutynin.   9.  Bladder calculi -Outpatient follow-up with urology.  10.  Anemia -Hemoglobin stable at 8.5. -Follow H&H. -Transfusion threshold hemoglobin < 7.    DVT prophylaxis: Lovenox Code Status: Full Family Communication: Updated patient, no family at bedside.   Disposition: Patient currently medically stable.  Awaiting inpatient psychiatric admission.    Status is: Inpatient Remains inpatient appropriate because: Needs inpatient psychiatric admission once medically stable.   Consultants:  Neurology: Dr.Lindzen 05/15/2022 PCCM: Dr. Lake Bells 05/15/2022 Psychiatry: Sheran Fava FNP, 05/18/2022 Palliative care: Dr. Rowe Pavy 05/21/2022 Wound care RN, Phineas Douglas 05/24/2022  Procedures:  CT head 05/13/2022, 05/15/2022 CT chest abdomen and pelvis 05/12/2022 Abdominal films 05/16/2022 Chest x-ray 05/15/2022, 05/13/2022 MRI brain 05/16/2022 Right upper quadrant ultrasound 04/21/2022 EEG  Significant Hospital Events: Including procedures, antibiotic start and stop dates in addition to other pertinent events   10/28 admit obtunded found down 10/30  tx to ICU for intubation 10/31 waking up and following commands overnight.  11/1 SBT. Extubated 11/2 reveals that ingestion of  pills at home was in fact a suicide attempt. 1:1 sitter, psych consult. Transfer out of ICU   Antimicrobials:  Anti-infectives (From admission, onward)    Start     Dose/Rate Route Frequency Ordered Stop   05/25/22 2200  sulfamethoxazole-trimethoprim (BACTRIM DS) 800-160 MG per tablet 1 tablet       Note to Pharmacy: Patient taking differently: 25 day course. From 10/18-11/12     1 tablet Oral Every 12 hours 05/25/22 1529 06/03/22 2159   05/15/22 1830  Ampicillin-Sulbactam (UNASYN) 3 g in sodium chloride 0.9 % 100 mL IVPB        3 g 200 mL/hr over 30 Minutes Intravenous Every 6 hours 05/15/22 1821 05/20/22 1629         Subjective: Patient laying in bed sleeping but easily arousable.  Stated did not sleep much last night.  No chest pain.  No shortness of breath.  Still with chronic abdominal pain however addition of pain medication helping.  On dysphagia 3 diet which seems to be tolerating.  Sitter at bedside.    Objective: Vitals:   05/25/22 1349 05/25/22 2145 05/26/22 0657 05/26/22 1215  BP: 117/62 (!) 108/43 (!) 104/54   Pulse: 70 82 83   Resp: 17 18 18    Temp: 98.4 F (36.9 C) 99.1 F (37.3 C) 97.8 F (36.6 C)   TempSrc: Oral  Oral   SpO2: 99% 98% 98%   Weight:    68.1 kg  Height:    5' 10"  (1.778 m)    Intake/Output Summary (Last 24 hours) at 05/26/2022 1310 Last data filed at 05/26/2022 1100 Gross per 24 hour  Intake 3000.57 ml  Output 2250 ml  Net 750.57 ml    Filed Weights   05/15/22 1400 05/15/22 1727 05/26/22 1215  Weight: 66.3 kg 64.7 kg 68.1 kg    Examination:  General exam: NAD.  Respiratory system: Lungs clear to auscultation bilaterally anterior lung fields.  No wheezes, no crackles, rhonchi.  Fair air movement.   Cardiovascular system: RRR no murmurs rubs or gallops.  No JVD.  No lower extremity edema.  Gastrointestinal  system: Abdomen is soft, nondistended, decreased chronic tenderness to palpation lower abdominal region, positive bowel sounds.  No rebound.  No guarding.  Ileostomy bag with stool noted.   Central nervous system: Alert and oriented. No focal neurological deficits. Extremities: Symmetric 5 x 5 power. Skin: No rashes, lesions or ulcers Psychiatry: Judgement and insight appear fair. Mood & affect appropriate.     Data Reviewed: I have personally reviewed following labs and imaging studies  CBC: Recent Labs  Lab 05/24/22 1044 05/25/22 0226 05/26/22 0441  WBC 6.7 7.1 7.3  NEUTROABS 3.9 4.5 5.2  HGB 9.1* 9.0* 8.5*  HCT 30.6* 29.6* 28.1*  MCV 79.5* 80.4 80.5  PLT 231 248 205     Basic Metabolic Panel: Recent Labs  Lab 05/20/22 0419 05/21/22 0257 05/22/22 0257 05/24/22 1044 05/25/22 0226 05/26/22 0441  NA 139 138 136 141 140 140  K 4.3 4.0 3.8 4.1 3.6 4.0  CL 109 110 108 108 107 107  CO2 23 21* 23 26 28 28   GLUCOSE 107* 91 96 100* 100* 108*  BUN 31* 33* 26* 23 24* 26*  CREATININE 1.10 1.02 0.77 0.75 0.79 0.83  CALCIUM 8.4* 8.3* 8.2* 8.6* 8.4* 8.5*  MG 2.3 2.3 2.0 2.0 1.8  --   PHOS 5.1* 3.3 2.6  --  3.3  --      GFR: Estimated Creatinine Clearance:  80.9 mL/min (by C-G formula based on SCr of 0.83 mg/dL).  Liver Function Tests: Recent Labs  Lab 05/22/22 0257 05/24/22 1044 05/25/22 0226  AST 103* 74* 63*  ALT 135* 122* 109*  ALKPHOS 169* 219* 220*  BILITOT 0.5 0.6 0.5  PROT 6.0* 6.0* 6.2*  ALBUMIN 2.4* 2.5* 2.4*     CBG: Recent Labs  Lab 05/25/22 0602 05/25/22 1320 05/25/22 2019 05/26/22 0617 05/26/22 1303  GLUCAP 121* 96 110* 120* 93      No results found for this or any previous visit (from the past 240 hour(s)).        Radiology Studies: No results found.      Scheduled Meds:  busPIRone  5 mg Oral BID   Chlorhexidine Gluconate Cloth  6 each Topical Daily   enoxaparin (LOVENOX) injection  40 mg Subcutaneous Q24H   insulin aspart   0-9 Units Subcutaneous 2 times per day   lidocaine  1 patch Transdermal Q24H   oxybutynin  5 mg Oral TID   pantoprazole  40 mg Oral Daily   psyllium  1 packet Oral Daily   sertraline  50 mg Oral Daily   simethicone  80 mg Oral TID WC & HS   sulfamethoxazole-trimethoprim  1 tablet Oral Q12H   tamsulosin  0.4 mg Oral QPC supper   Continuous Infusions:  lactated ringers 50 mL/hr at 68/61/68 3729   TPN CYCLIC-ADULT (ION)       LOS: 13 days    Time spent: 35 minutes    Irine Seal, MD [facility]   To contact the attending provider between 7A-7P or the covering provider during after hours 7P-7A, please log into the web site [email or web address] and access using universal [place] password for that web site. If you do not have the password, please call the hospital operator.  05/26/2022, 1:10 PM

## 2022-05-26 NOTE — Plan of Care (Signed)
  Problem: Health Behavior/Discharge Planning: Goal: Ability to manage health-related needs will improve Outcome: Progressing   Problem: Coping: Goal: Level of anxiety will decrease Outcome: Progressing

## 2022-05-26 NOTE — Plan of Care (Signed)
Plan of care reviewed. 

## 2022-05-26 NOTE — Care Management Important Message (Signed)
Important Message  Patient Details IM Letter given Name: [name] MRN: [id number] Date of Birth: 1951/03/23   Medicare Important Message Given:  Yes     Kerin Salen 05/26/2022, 2:58 PM

## 2022-05-26 NOTE — Progress Notes (Signed)
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  Assessment: Patient with PMH of Crohn's disease and complex surgical history as above on chronic TPN who presented on 05/13/22 with altered mental status after spouse found patient unresponsive with multiple pill bottles open around him. Patient was transported from SNF to ED the day prior for generalized weakness and acute worsening of his chronic abdominal pain. Patient decided to return home instead of his SNF. Pharmacy consulted to resume TPN as inpatient.   Discharge from [facility] 04/19/22:  TPN pharmacist at [facility]:  Estimated Nutrient Requirements: Protein: 1.5 - 1.7 g/kg/day (98 - 104 g/day), Total kcal: 30 - 35 kcal/kg/day (1830 - 1983 kcal/day)  TPN Formulation at [facility] per TPN pharmacist: [A 6.6%, D 20%, F 3.1%] @ goal rate, 65 mL/hr (TV: 1560 mL/day) provides: Protein 1.7 g/kg/day 103 g/day, Total kcal 32 kcal/kg/day 1971 kcal/day  NPC: N ratio 94   Admitted at [facility] 04/29/22-05/03/22: Nutritional Goals: TPN 80 mL/hr (provides 113 g of protein and 1943 kcals per day) RD Assessment: (10/16): 2100-2450kcal (30-35kcal/kg), Protein 105-130g (1.5-1.8g/kg) Utilize intralipid rather than SMOF as 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  [facility]: [name], PharmD, ph: [phone] 10/28 Most recent formula: Total volume 1560 mL:  Dextrose 220 g/day, Plenamine 120 g/day, SMOFlipid 50 g/day, NaCl 75 mEq, CaGluc 6 mEq, Potassium Ac 20 mEq, Potassium Phos 13.5 nM, Mag Sulfate 8 mEq, Vitamins/trace  elements  Glucose / Insulin: No hx DM. CBG very well controlled while on and off TPN (max 2 units SSI/24 hr since admission) Electrolytes: All WNL; bicarb trending slowly up Renal: SCr stable WNL; BUN borderline elevated; UOP remains good Hepatic: LFTs bumped on 11/6; still elevated but trending down; tbili, TG remain stable WNL; albumin remains low but stable I/O: ileostomy output remains stable between [id number] ml/day; +4L this admission - Still waiting for osmolality to result (send-out lab) - MIVF: LR @ 50 ml/hr GI Imaging: - 11/6 abd [place] - normal liver (checked d/t elevated LFTs) - 10/27 CTa/p: no significant interval change since 10/14 CT. No evidence of bowel obstruction or other complicating feature at the anastomotic site or ostomy. - 10/14 CTa/p: previous surgical changes noted (transverse-sigmoid anastomosis) with stable inflammation since 10/12 CT GI Surgeries / Procedures: n/a  Central access: prior to admission - double lumen CVC TPN start date: started PTA  Nutritional Goals: TPN provides 131 g of protein and 2010 kcals per day.  RD Assessment this admission:  11/2 - Continues on TPN at higher calorie provision to prevent further weight loss.  Estimated Needs Total Energy Estimated Needs: 2015-2350kcal Total Protein Estimated Needs: 100-135g Total Fluid Estimated Needs: 2413m (Holliday Segar)  Current Nutrition:  TPN Dysphagia-3 163/0>>   Plan:  Cyclic TPN with 21601ml over 18 hr cycle Max GIR 4.36 Electrolytes in TPN: decrease Ac Na 75 mEq/L K 45 mEq/L Ca 5 mEq/L Mg 4 mEq/L Phos 15 mmol/L Cl:Ac = 1:1 Add standard MVI and trace elements to TPN continue pepcid in TPN  With excellent CBGs, will limit finger sticks to twice daily -  2 hrs after TPN start, 1 hour after TPN stop Continue LR at 50 ml/hr; further adjustments per MD Monitor TPN labs on Mon/Thurs at minimum Cmet, Mg tomorrow AM   Reuel Boom, PharmD, BCPS [phone] 05/26/2022, 7:24 AM

## 2022-05-26 NOTE — Progress Notes (Signed)
Physical Therapy Treatment Patient Details Name: [name] MRN: [id number] DOB: June 21, 1951 Today's Date: 05/26/2022   History of Present Illness 71 year old male with complicated past medical history as outlined below, which is significant for Crohn's disease with persistent abdominal pain.  On April of this year he underwent exploratory laparotomy with small bowel resection, repair of enterotomy, and creation of a diverting loop ileostomy at [facility].  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  He has been residing in [facility] SNF from where he presented to the emergency department on 10/27 for pain at the site of his ostomy with foul-smelling discharge.  Work-up of the ostomy and abdomen including CT imaging was unremarkable.  He was discharged to SNF, however, rather than returning to SNF he found private transport to his home.Then on 10/28 he again presented to [facility] emergency department with chief complaint of unresponsiveness.11/2 reveals that ingestion of pills at home was in fact a suicide attempt. 1:1 sitter, psych consult    PT Comments    Pt in bed with safety sitter in room.  Assisted OOB to amb required increased time and + 2 assist.  General transfer comment: + 2 side by side Mod Assist sit to stand.  Pt nervous.  Max encouragement to increase activity.  Mild c/o dizziness.  Feeling "really weak". Quick to say, "I'm dizzy".  General Gait Details: still requires + 2 asisst with a third following with recliner.  VERY limited amb distance due to MAX c/o weakness/fatigue and increased c/o dizziness along with nervousness.  Required POSITIVE re-enforcement. Pt quick to say "I'm dizzy". Vitals WNL.  AMB twice 20 feet x 2 one seated rest break.  Assisted back to bed per pt to rest.   Pt will need ST Rehab at SNF to address mobility and functional decline prior to safely returning home.   Recommendations for follow up therapy are one component of a multi-disciplinary discharge  planning process, led by the attending physician.  Recommendations may be updated based on patient status, additional functional criteria and insurance authorization.  Follow Up Recommendations  Skilled nursing-short term rehab (<3 hours/day) Can patient physically be transported by private vehicle: No   Assistance Recommended at Discharge Frequent or constant Supervision/Assistance  Patient can return home with the following A little help with bathing/dressing/bathroom;Assist for transportation;Help with stairs or ramp for entrance;A lot of help with walking and/or transfers   Equipment Recommendations  None recommended by PT    Recommendations for Other Services       Precautions / Restrictions Precautions Precautions: Fall Precaution Comments: right colostomy Restrictions Weight Bearing Restrictions: No     Mobility  Bed Mobility Overal bed mobility: Needs Assistance Bed Mobility: Supine to Sit     Supine to sit: Mod assist, Max assist     General bed mobility comments: assist for trunk upright and LEs over EOB used bed pad to complete scooting    Transfers Overall transfer level: Needs assistance Equipment used: Rolling walker (2 wheels) Transfers: Sit to/from Stand Sit to Stand: Mod assist, +2 physical assistance   Step pivot transfers: Mod assist, +2 physical assistance       General transfer comment: + 2 side by side Mod Assist sit to stand.  Pt nervous.  Max encouragement to increase activity.  Mild c/o dizziness.  Feeling "really weak". Quick to say, "I'm dizzy".    Ambulation/Gait Ambulation/Gait assistance: Mod assist, +2 physical assistance, +2 safety/equipment Gait Distance (Feet): 40 Feet (20 feet x 2  one seated rest break) Assistive device: Rolling walker (2 wheels) Gait Pattern/deviations: Step-through pattern, Trunk flexed, Decreased stride length Gait velocity: decreased     General Gait Details: still requires + 2 asisst with a third  following with recliner.  VERY limited amb distance due to MAX c/o weakness/fatigue and increased c/o dizziness along with nervousness.  Required POSITIVE re-enforcement. Pt quick to say "I'm dizzy". Vitals WNL.  AMB twice 20 feet x 2 one seated rest break.   Stairs             Wheelchair Mobility    Modified Rankin (Stroke Patients Only)       Balance                                            Cognition Arousal/Alertness: Awake/alert Behavior During Therapy: WFL for tasks assessed/performed Overall Cognitive Status: Within Functional Limits for tasks assessed Area of Impairment: Following commands, Safety/judgement                 Orientation Level: Time Current Attention Level: Focused   Following Commands: Follows one step commands with increased time       General Comments: AxO x 3 very quiet, soft spoken, cooperative.  C/O 8/10 ABD pain and dizziness with mobility however vitals WNL        Exercises      General Comments        Pertinent Vitals/Pain Pain Assessment Pain Assessment: Faces Faces Pain Scale: Hurts little more Pain Location: ABD "middle" Pain Descriptors / Indicators: Discomfort, Grimacing Pain Intervention(s): Monitored during session, Repositioned    Home Living                          Prior Function            PT Goals (current goals can now be found in the care plan section) Progress towards PT goals: Progressing toward goals    Frequency    Min 2X/week      PT Plan Current plan remains appropriate    Co-evaluation              AM-PAC PT "6 Clicks" Mobility   Outcome Measure  Help needed turning from your back to your side while in a flat bed without using bedrails?: A Lot Help needed moving from lying on your back to sitting on the side of a flat bed without using bedrails?: A Lot Help needed moving to and from a bed to a chair (including a wheelchair)?: A Lot Help needed  standing up from a chair using your arms (e.g., wheelchair or bedside chair)?: A Lot Help needed to walk in hospital room?: A Lot Help needed climbing 3-5 steps with a railing? : Total 6 Click Score: 11    End of Session Equipment Utilized During Treatment: Gait belt Activity Tolerance: Patient limited by fatigue Patient left: with nursing/sitter in room;in bed Nurse Communication: Mobility status;Patient requests pain meds PT Visit Diagnosis: Difficulty in walking, not elsewhere classified (R26.2)     Time: 1104-1130 PT Time Calculation (min) (ACUTE ONLY): 26 min  Charges:  $Gait Training: 8-22 mins $Therapeutic Activity: 8-22 mins                    Rica Koyanagi  PTA Acute  Rehabilitation Services Office M-F  [phone] Weekend pager [phone]

## 2022-05-26 NOTE — Progress Notes (Signed)
Chaplain attempted follow up, but [name] was not feeling well and asked if chaplain could return at a later time.  [address], [place] Pager, [phone]

## 2022-05-27 DIAGNOSIS — J9601 Acute respiratory failure with hypoxia: Secondary | ICD-10-CM | POA: Diagnosis not present

## 2022-05-27 DIAGNOSIS — E43 Unspecified severe protein-calorie malnutrition: Secondary | ICD-10-CM | POA: Diagnosis not present

## 2022-05-27 DIAGNOSIS — G9341 Metabolic encephalopathy: Secondary | ICD-10-CM | POA: Diagnosis not present

## 2022-05-27 DIAGNOSIS — D649 Anemia, unspecified: Secondary | ICD-10-CM | POA: Diagnosis not present

## 2022-05-27 LAB — COMPREHENSIVE METABOLIC PANEL
ALT: 61 U/L — ABNORMAL HIGH (ref 0–44)
AST: 33 U/L (ref 15–41)
Albumin: 2.3 g/dL — ABNORMAL LOW (ref 3.5–5.0)
Alkaline Phosphatase: 174 U/L — ABNORMAL HIGH (ref 38–126)
Anion gap: 4 — ABNORMAL LOW (ref 5–15)
BUN: 24 mg/dL — ABNORMAL HIGH (ref 8–23)
CO2: 27 mmol/L (ref 22–32)
Calcium: 8.3 mg/dL — ABNORMAL LOW (ref 8.9–10.3)
Chloride: 108 mmol/L (ref 98–111)
Creatinine, Ser: 0.92 mg/dL (ref 0.61–1.24)
GFR, Estimated: >60 mL/min (ref >60)
Glucose, Bld: 100 mg/dL — ABNORMAL HIGH (ref 70–99)
Potassium: 3.8 mmol/L (ref 3.5–5.1)
Sodium: 139 mmol/L (ref 135–145)
Total Bilirubin: 0.5 mg/dL (ref 0.3–1.2)
Total Protein: 5.6 g/dL — ABNORMAL LOW (ref 6.5–8.1)

## 2022-05-27 LAB — GLUCOSE, CAPILLARY
Glucose-Capillary: 91 mg/dL (ref 70–99)
Glucose-Capillary: 110 mg/dL — ABNORMAL HIGH (ref 70–99)

## 2022-05-27 LAB — MAGNESIUM: Magnesium: 1.9 mg/dL (ref 1.7–2.4)

## 2022-05-27 MED ORDER — PROCHLORPERAZINE EDISYLATE 10 MG/2ML IJ SOLN
10.0000 mg | Freq: Four times a day (QID) | INTRAMUSCULAR | Status: DC | PRN
  Administered 2022-05-27 – 2022-05-29 (×3): 10 mg via INTRAVENOUS
  Filled 2022-05-27 (×4): qty 2

## 2022-05-27 MED ORDER — PROCHLORPERAZINE MALEATE 10 MG PO TABS
10.0000 mg | ORAL_TABLET | Freq: Four times a day (QID) | ORAL | Status: DC | PRN
  Administered 2022-05-28 – 2022-06-06 (×10): 10 mg via ORAL
  Filled 2022-05-27 (×14): qty 1

## 2022-05-27 MED ORDER — TRAVASOL 10 % IV SOLN
INTRAVENOUS | Status: AC
  Filled 2022-05-27: qty 1312.5

## 2022-05-27 MED ORDER — OXYBUTYNIN CHLORIDE 5 MG PO TABS
5.0000 mg | ORAL_TABLET | Freq: Once | ORAL | Status: AC
  Administered 2022-05-27: 5 mg via ORAL
  Filled 2022-05-27: qty 1

## 2022-05-27 NOTE — Progress Notes (Signed)
PROGRESS NOTE    [name]  VVO:[id number] DOB: 06-25-1951 DOA: 05/13/2022 PCP: [name], MD    Chief Complaint  Patient presents with   Altered Mental Status    Brief Narrative:  [phone] with hx Crohn's with persistent abd pain s/p extensive surgeries and is currently TPN dependent. Pt presented with unresponsiveness in apparent suicide attempt via overdose. Pt remained unresponsive and later was unable to maintain airway, prompting intubation and transfer to ICU. Pt now extubated and care transferred to hospitalist.     Assessment & Plan:   Principal Problem:   Metabolic encephalopathy Active Problems:   Chronic anemia   Acute respiratory failure with hypoxia (HCC)   Severe malnutrition (HCC)   Generalized weakness   Drug overdose, intentional, initial encounter ([place])   Chronic pain syndrome   Aspiration pneumonia ([place])  #1 intentional drug overdose/MDD -Patient noted to have intentionally taking pain medication and anxiolytics with intentional suicidal attempt. -Patient with some clinical improvement feeling stronger tolerating current clear diet. -Sitter at bedside. -Continue Zoloft 50 mg daily. -Patient was on Prozac and switched to liquid sertraline per psychiatry. -Patient started back on reduced home regimen of BuSpar 5 mg twice daily.  -Continue one-to-one sitter. -Psychiatry recommending inpatient psych admission when medically cleared. -Patient currently medically stable. -Psychiatry following pending admission to inpatient psychiatric facility.  2.  Toxic metabolic encephalopathy -Likely secondary to intentional medication overdose. -Patient clinically improved conversant following commands answering questions appropriately. -Head CT, MRI brain done negative for any acute abnormalities. -Continue supportive care.  3.  Aspiration pneumonia -Seen by speech therapy underwent MBS 05/22/2022 was initially on clear liquids and diet subsequently advanced  to a dysphagia 3 diet which she seems to be tolerating.  -Status post IV Unasyn.  4.  Crohn's disease -Status post multiple surgeries in the past. -TPN dependent.  5.  Severe protein calorie malnutrition -Currently on TPN. -Saline lock IV fluids. -Diet advanced to dysphagia 3 diet which he seems to be tolerating. -SLP following.  6.  Chronic pain -Was on morphine prior to admission currently on Toradol as needed. -Palliative care consulted for symptom management in light of history of uncontrolled pain. -Currently on Dilaudid as needed for palliative care. -Patient placed back on home regimen MS IR 15 mg every 6 hours as needed for breakthrough pain. -Try to wean off IV Dilaudid.  7.  A-fib -Noted to be transient A-fib in the ICU that is quickly resolved. -Currently rate controlled.  8.  Chronic prostatitis -Status post IV Unasyn. -Continue home regimen Bactrim and oxybutynin.   9.  Bladder calculi -Outpatient follow-up with urology.  10.  Anemia -Hemoglobin stable at 8.5. -Follow H&H. -Transfusion threshold hemoglobin < 7.    DVT prophylaxis: Lovenox Code Status: Full Family Communication: Updated patient, and wife at bedside.  Disposition: Patient currently medically stable.  Awaiting inpatient psychiatric admission.    Status is: Inpatient Remains inpatient appropriate because: Needs inpatient psychiatric admission once medically stable.   Consultants:  Neurology: Dr.Lindzen 05/15/2022 PCCM: Dr. Lake Bells 05/15/2022 Psychiatry: Sheran Fava FNP, 05/18/2022 Palliative care: Dr. Rowe Pavy 05/21/2022 Wound care RN, Phineas Douglas 05/24/2022  Procedures:  CT head 05/13/2022, 05/15/2022 CT chest abdomen and pelvis 05/12/2022 Abdominal films 05/16/2022 Chest x-ray 05/15/2022, 05/13/2022 MRI brain 05/16/2022 Right upper quadrant ultrasound 04/21/2022 EEG  Significant Hospital Events: Including procedures, antibiotic start and stop dates in addition to other  pertinent events   10/28 admit obtunded found down 10/30 tx to ICU for intubation 10/31 waking up  and following commands overnight.  11/1 SBT. Extubated 11/2 reveals that ingestion of pills at home was in fact a suicide attempt. 1:1 sitter, psych consult. Transfer out of ICU   Antimicrobials:  Anti-infectives (From admission, onward)    Start     Dose/Rate Route Frequency Ordered Stop   05/25/22 2200  sulfamethoxazole-trimethoprim (BACTRIM DS) 800-160 MG per tablet 1 tablet       Note to Pharmacy: Patient taking differently: 25 day course. From 10/18-11/12     1 tablet Oral Every 12 hours 05/25/22 1529 06/03/22 2159   05/15/22 1830  Ampicillin-Sulbactam (UNASYN) 3 g in sodium chloride 0.9 % 100 mL IVPB        3 g 200 mL/hr over 30 Minutes Intravenous Every 6 hours 05/15/22 1821 05/20/22 1629         Subjective: Laying in bed.  Tolerating dysphagia 3 diet.  States still with nausea a few Zofran is not helping states uses antiemetic at home that starts with a C.  Wife at bedside.  Sitter at bedside.   Objective: Vitals:   05/26/22 1215 05/26/22 1405 05/26/22 2157 05/27/22 0542  BP:  (!) 103/57 (!) 107/53 (!) 116/57  Pulse:  77 78 83  Resp:  18 16 17   Temp:  97.6 F (36.4 C) 98.4 F (36.9 C) 98.2 F (36.8 C)  TempSrc:  Oral Oral Oral  SpO2:  98% 97% 97%  Weight: 68.1 kg     Height: 5' 10"  (1.778 m)       Intake/Output Summary (Last 24 hours) at 05/27/2022 1244 Last data filed at 05/27/2022 1135 Gross per 24 hour  Intake 2888.01 ml  Output 1900 ml  Net 988.01 ml    Filed Weights   05/15/22 1400 05/15/22 1727 05/26/22 1215  Weight: 66.3 kg 64.7 kg 68.1 kg    Examination:  General exam: NAD.  Respiratory system: CTA B anterior lung fields.  No wheezes, no crackles, no rhonchi.  Fair air movement.  Speaking in full sentences.   Cardiovascular system: Regular rate rhythm no murmurs rubs or gallops.  No JVD.  No lower extremity edema. Gastrointestinal system: Abdomen  is soft, nondistended, no abdominal chronic tenderness to palpation, positive bowel sounds.  No rebound.  No guarding.  Ileostomy bag with stool.  Central nervous system: Alert and oriented. No focal neurological deficits. Extremities: Symmetric 5 x 5 power. Skin: No rashes, lesions or ulcers Psychiatry: Judgement and insight appear fair. Mood & affect appropriate.     Data Reviewed: I have personally reviewed following labs and imaging studies  CBC: Recent Labs  Lab 05/24/22 1044 05/25/22 0226 05/26/22 0441  WBC 6.7 7.1 7.3  NEUTROABS 3.9 4.5 5.2  HGB 9.1* 9.0* 8.5*  HCT 30.6* 29.6* 28.1*  MCV 79.5* 80.4 80.5  PLT 231 248 205     Basic Metabolic Panel: Recent Labs  Lab 05/21/22 0257 05/22/22 0257 05/24/22 1044 05/25/22 0226 05/26/22 0441 05/27/22 0204  NA 138 136 141 140 140 139  K 4.0 3.8 4.1 3.6 4.0 3.8  CL 110 108 108 107 107 108  CO2 21* 23 26 28 28 27   GLUCOSE 91 96 100* 100* 108* 100*  BUN 33* 26* 23 24* 26* 24*  CREATININE 1.02 0.77 0.75 0.79 0.83 0.92  CALCIUM 8.3* 8.2* 8.6* 8.4* 8.5* 8.3*  MG 2.3 2.0 2.0 1.8  --  1.9  PHOS 3.3 2.6  --  3.3  --   --      GFR: Estimated Creatinine  Clearance: 73 mL/min (by C-G formula based on SCr of 0.92 mg/dL).  Liver Function Tests: Recent Labs  Lab 05/22/22 0257 05/24/22 1044 05/25/22 0226 05/27/22 0204  AST 103* 74* 63* 33  ALT 135* 122* 109* 61*  ALKPHOS 169* 219* 220* 174*  BILITOT 0.5 0.6 0.5 0.5  PROT 6.0* 6.0* 6.2* 5.6*  ALBUMIN 2.4* 2.5* 2.4* 2.3*     CBG: Recent Labs  Lab 05/25/22 2019 05/26/22 0617 05/26/22 1303 05/26/22 2034 05/27/22 1238  GLUCAP 110* 120* 93 116* 91      No results found for this or any previous visit (from the past 240 hour(s)).        Radiology Studies: No results found.      Scheduled Meds:  busPIRone  5 mg Oral BID   Chlorhexidine Gluconate Cloth  6 each Topical Daily   enoxaparin (LOVENOX) injection  40 mg Subcutaneous Q24H   insulin aspart   0-9 Units Subcutaneous 2 times per day   lidocaine  1 patch Transdermal Q24H   oxybutynin  5 mg Oral TID   pantoprazole  40 mg Oral Daily   psyllium  1 packet Oral Daily   sertraline  50 mg Oral Daily   simethicone  80 mg Oral TID WC & HS   sulfamethoxazole-trimethoprim  1 tablet Oral Q12H   tamsulosin  0.4 mg Oral QPC supper   Continuous Infusions:  TPN CYCLIC-ADULT (ION)       LOS: 14 days    Time spent: 35 minutes    Irine Seal, MD [facility]   To contact the attending provider between 7A-7P or the covering provider during after hours 7P-7A, please log into the web site [email or web address] and access using universal [place] password for that web site. If you do not have the password, please call the hospital operator.  05/27/2022, 12:44 PM

## 2022-05-27 NOTE — Progress Notes (Signed)
  Daily Progress Note   Patient Name: [name]       Date: 05/27/2022 DOB: 1950/12/11  Age: 71 y.o. MRN#: [id number] Attending Physician: Eugenie Filler, MD Primary Care Physician: [name], MD Admit Date: 05/13/2022 Length of Stay: 14 days  Patient now back on home regimen for chronic pain management. Will need to follow up with his chronic pain provider for management when appropriate. Psych following patient; he is awaiting inpatient psychiatric admission. As goals for medical care are currently determined and patient has returned to chronic pain regimen, palliative care team will sign off. Please reach out if our team can be of further assistance in the future. Thank you for involving our team in patient's care.    Chelsea Aus, DO Palliative Care Provider PMT # [phone]

## 2022-05-27 NOTE — Progress Notes (Signed)
PHARMACY - TOTAL PARENTERAL NUTRITION CONSULT NOTE   Indication: Crohn's disease and complex past surgical history including ex lap with small bowel resection, lysis of adhesion, mesh explantation, and diverting loop ileostomy in April 2023, on TPN prior to admission  Patient Measurements: Height: 5' 10"  (177.8 cm) Weight: 68.1 kg (150 lb 1.6 oz) IBW/kg (Calculated) : 73 TPN AdjBW (KG): 68.1 Body mass index is 21.54 kg/m.  Assessment: Patient with PMH of Crohn's disease and complex surgical history as above on chronic TPN who presented on 05/13/22 with altered mental status after spouse found patient unresponsive with multiple pill bottles open around him. Patient was transported from SNF to ED the day prior for generalized weakness and acute worsening of his chronic abdominal pain. Patient decided to return home instead of his SNF. Pharmacy consulted to resume TPN as inpatient.   Discharge from [facility] 04/19/22:  TPN pharmacist at [facility]:  Estimated Nutrient Requirements: Protein: 1.5 - 1.7 g/kg/day (98 - 104 g/day), Total kcal: 30 - 35 kcal/kg/day (1830 - 1983 kcal/day)  TPN Formulation at [facility] per TPN pharmacist: [A 6.6%, D 20%, F 3.1%] @ goal rate, 65 mL/hr (TV: 1560 mL/day) provides: Protein 1.7 g/kg/day 103 g/day, Total kcal 32 kcal/kg/day 1971 kcal/day  NPC: N ratio 94   Admitted at [facility] 04/29/22-05/03/22: Nutritional Goals: TPN 80 mL/hr (provides 113 g of protein and 1943 kcals per day) RD Assessment: (10/16): 2100-2450kcal (30-35kcal/kg), Protein 105-130g (1.5-1.8g/kg) Utilize intralipid rather than SMOF as 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  [facility]: [name], PharmD, ph: [phone] 10/28 Most recent formula: Total volume 1560 mL:  Dextrose 220 g/day, Plenamine 120 g/day, SMOFlipid 50 g/day, NaCl 75 mEq, CaGluc 6 mEq, Potassium Ac 20 mEq, Potassium Phos 13.5 nM, Mag Sulfate 8 mEq, Vitamins/trace  elements  Glucose / Insulin: No hx DM. CBG very well controlled while on and off TPN (max 2 units SSI/24 hr since admission) Electrolytes: All WNL Renal: SCr stable WNL; BUN borderline elevated; UOP remains good Hepatic: LFTs bumped on 11/6; still elevated but trending down; tbili, TG remain stable WNL; albumin remains low but stable I/O: ileostomy output remains down 200 ml/day; +5.6L this admission - osmolality 298 slightly elevated - MIVF: LR @ 50 ml/hr to be dc'd this AM GI Imaging: - 11/6 abd [place] - normal liver (checked d/t elevated LFTs) - 10/27 CTa/p: no significant interval change since 10/14 CT. No evidence of bowel obstruction or other complicating feature at the anastomotic site or ostomy. - 10/14 CTa/p: previous surgical changes noted (transverse-sigmoid anastomosis) with stable inflammation since 10/12 CT GI Surgeries / Procedures: n/a  Central access: prior to admission - double lumen CVC TPN start date: started PTA  Nutritional Goals: TPN provides 131 g of protein and 2010 kcals per day.  RD Assessment this admission:  11/2 - Continues on TPN at higher calorie provision to prevent further weight loss.  Estimated Needs Total Energy Estimated Needs: 2015-2350kcal Total Protein Estimated Needs: 100-135g Total Fluid Estimated Needs: 2473m (Holliday Segar)  Current Nutrition:  TPN Dysphagia-3 135/0>>   Plan:  Cyclic TPN with 20938ml over 18 hr cycle Max GIR 4.36 Electrolytes in TPN:  Na 75 mEq/L K 45 mEq/L Ca 5 mEq/L Mg 4 mEq/L Phos 15 mmol/L Cl:Ac = 1:1 Add standard MVI and trace elements to TPN continue pepcid in TPN  With excellent CBGs, will limit finger sticks to twice daily - 2 hrs after  TPN start, 1 hour after TPN stop IVF dc'd by MD Monitor TPN labs on Mon/Thurs at minimum [place], PharmD, [place]: [phone] 05/27/2022, 8:19 AM

## 2022-05-28 DIAGNOSIS — E43 Unspecified severe protein-calorie malnutrition: Secondary | ICD-10-CM | POA: Diagnosis not present

## 2022-05-28 DIAGNOSIS — J9601 Acute respiratory failure with hypoxia: Secondary | ICD-10-CM | POA: Diagnosis not present

## 2022-05-28 DIAGNOSIS — G9341 Metabolic encephalopathy: Secondary | ICD-10-CM | POA: Diagnosis not present

## 2022-05-28 DIAGNOSIS — D649 Anemia, unspecified: Secondary | ICD-10-CM | POA: Diagnosis not present

## 2022-05-28 LAB — COMPREHENSIVE METABOLIC PANEL
ALT: 48 U/L — ABNORMAL HIGH (ref 0–44)
AST: 28 U/L (ref 15–41)
Albumin: 2.4 g/dL — ABNORMAL LOW (ref 3.5–5.0)
Alkaline Phosphatase: 156 U/L — ABNORMAL HIGH (ref 38–126)
Anion gap: 6 (ref 5–15)
BUN: 22 mg/dL (ref 8–23)
CO2: 26 mmol/L (ref 22–32)
Calcium: 8.3 mg/dL — ABNORMAL LOW (ref 8.9–10.3)
Chloride: 106 mmol/L (ref 98–111)
Creatinine, Ser: 0.92 mg/dL (ref 0.61–1.24)
GFR, Estimated: >60 mL/min (ref >60)
Glucose, Bld: 106 mg/dL — ABNORMAL HIGH (ref 70–99)
Potassium: 3.9 mmol/L (ref 3.5–5.1)
Sodium: 138 mmol/L (ref 135–145)
Total Bilirubin: 0.4 mg/dL (ref 0.3–1.2)
Total Protein: 6.2 g/dL — ABNORMAL LOW (ref 6.5–8.1)

## 2022-05-28 LAB — GLUCOSE, CAPILLARY
Glucose-Capillary: 107 mg/dL — ABNORMAL HIGH (ref 70–99)
Glucose-Capillary: 114 mg/dL — ABNORMAL HIGH (ref 70–99)
Glucose-Capillary: 95 mg/dL (ref 70–99)

## 2022-05-28 MED ORDER — TRAVASOL 10 % IV SOLN
INTRAVENOUS | Status: AC
  Filled 2022-05-28: qty 1312.5

## 2022-05-28 MED ORDER — TRAVASOL 10 % IV SOLN
INTRAVENOUS | Status: DC
  Filled 2022-05-28: qty 1312.5

## 2022-05-28 NOTE — Progress Notes (Signed)
PHARMACY - TOTAL PARENTERAL NUTRITION CONSULT NOTE   Indication: Crohn's disease and complex past surgical history including ex lap with small bowel resection, lysis of adhesion, mesh explantation, and diverting loop ileostomy in April 2023, on TPN prior to admission  Patient Measurements: Height: 5' 10"  (177.8 cm) Weight: 68.1 kg (150 lb 1.6 oz) IBW/kg (Calculated) : 73 TPN AdjBW (KG): 68.1 Body mass index is 21.54 kg/m.  Assessment: Patient with PMH of Crohn's disease and complex surgical history as above on chronic TPN who presented on 05/13/22 with altered mental status after spouse found patient unresponsive with multiple pill bottles open around him. Patient was transported from SNF to ED the day prior for generalized weakness and acute worsening of his chronic abdominal pain. Patient decided to return home instead of his SNF. Pharmacy consulted to resume TPN as inpatient.   Discharge from [facility] 04/19/22:  TPN pharmacist at [facility]:  Estimated Nutrient Requirements: Protein: 1.5 - 1.7 g/kg/day (98 - 104 g/day), Total kcal: 30 - 35 kcal/kg/day (1830 - 1983 kcal/day)  TPN Formulation at [facility] per TPN pharmacist: [A 6.6%, D 20%, F 3.1%] @ goal rate, 65 mL/hr (TV: 1560 mL/day) provides: Protein 1.7 g/kg/day 103 g/day, Total kcal 32 kcal/kg/day 1971 kcal/day  NPC: N ratio 94   Admitted at [facility] 04/29/22-05/03/22: Nutritional Goals: TPN 80 mL/hr (provides 113 g of protein and 1943 kcals per day) RD Assessment: (10/16): 2100-2450kcal (30-35kcal/kg), Protein 105-130g (1.5-1.8g/kg) Utilize intralipid rather than SMOF as 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  [facility]: [name], PharmD, ph: [phone] 10/28 Most recent formula: Total volume 1560 mL:  Dextrose 220 g/day, Plenamine 120 g/day, SMOFlipid 50 g/day, NaCl 75 mEq, CaGluc 6 mEq, Potassium Ac 20 mEq, Potassium Phos 13.5 nM, Mag Sulfate 8 mEq, Vitamins/trace  elements  Glucose / Insulin: No hx DM. CBG very well controlled while on and off TPN (max 2 units SSI/24 hr since admission) Electrolytes: All WNL Renal: SCr stable WNL; BUN borderline elevated; UOP remains good Hepatic: LFTs bumped on 11/6; still elevated but trending down; tbili, TG remain stable WNL; albumin remains low but stable I/O: ileostomy output 525 ml/day; +3.5L this admission - osmolality 298 slightly elevated - MIVF: dc'd 11/11 GI Imaging: - 11/6 abd [place] - normal liver (checked d/t elevated LFTs) - 10/27 CTa/p: no significant interval change since 10/14 CT. No evidence of bowel obstruction or other complicating feature at the anastomotic site or ostomy. - 10/14 CTa/p: previous surgical changes noted (transverse-sigmoid anastomosis) with stable inflammation since 10/12 CT GI Surgeries / Procedures: n/a  Central access: prior to admission - double lumen CVC TPN start date: started PTA  Nutritional Goals: TPN provides 131 g of protein and 2010 kcals per day.  RD Assessment this admission:    Estimated Needs Total Energy Estimated Needs: 2015-2350kcal Total Protein Estimated Needs: 100-135g Total Fluid Estimated Needs: 2447m (Holliday Segar)  Current Nutrition:  TPN Dysphagia-3 196/2>>   Plan:  Cyclic TPN with 29528ml over 18 hr cycle Max GIR 4.36 Electrolytes in TPN:  Na 75 mEq/L K 45 mEq/L Ca 5 mEq/L Mg 4 mEq/L Phos 15 mmol/L Cl:Ac = 1:1 Add standard MVI and trace elements to TPN continue pepcid in TPN  With excellent CBGs, will limit finger sticks to twice daily - 2 hrs after TPN start, 1 hour after TPN stop IVF dc'd by MD Monitor TPN labs on Mon/Thurs at minimum   EPeggyann Juba  PharmD, [facility]: [phone] 05/28/2022, 8:12 AM

## 2022-05-28 NOTE — Progress Notes (Signed)
PROGRESS NOTE    [name]  PXT:[id number] DOB: 07-30-1950 DOA: 05/13/2022 PCP: [name], MD    Chief Complaint  Patient presents with   Altered Mental Status    Brief Narrative:  [phone] with hx Crohn's with persistent abd pain s/p extensive surgeries and is currently TPN dependent. Pt presented with unresponsiveness in apparent suicide attempt via overdose. Pt remained unresponsive and later was unable to maintain airway, prompting intubation and transfer to ICU. Pt now extubated and care transferred to hospitalist.     Assessment & Plan:   Principal Problem:   Metabolic encephalopathy Active Problems:   Chronic anemia   Acute respiratory failure with hypoxia (HCC)   Severe malnutrition (HCC)   Generalized weakness   Drug overdose, intentional, initial encounter ([place])   Chronic pain syndrome   Aspiration pneumonia ([place])  #1 intentional drug overdose/MDD -Patient noted to have intentionally taking pain medication and anxiolytics with intentional suicidal attempt. -Patient with some clinical improvement feeling stronger tolerating current clear diet. -Sitter at bedside. -Continue Zoloft 50 mg daily. -Patient was on Prozac and switched to liquid sertraline per psychiatry. -Patient started back on reduced home regimen of BuSpar 5 mg twice daily.  -Continue one-to-one sitter. -Psychiatry recommending inpatient psych admission when medically cleared. -Patient currently medically stable. -Psychiatry following pending admission to inpatient psychiatric facility.  2.  Toxic metabolic encephalopathy -Likely secondary to intentional medication overdose. -Patient clinically improved conversant following commands answering questions appropriately. -Head CT, MRI brain done negative for any acute abnormalities. -Continue supportive care.  3.  Aspiration pneumonia -Seen by speech therapy underwent MBS 05/22/2022 was initially on clear liquids and diet subsequently advanced  to a dysphagia 3 diet which she seems to be tolerating.  -Status post IV Unasyn.  4.  Crohn's disease -Status post multiple surgeries in the past. -TPN dependent.  5.  Severe protein calorie malnutrition -Currently on TPN. -Saline lock IV fluids. -Diet advanced to dysphagia 3 diet which he seems to be tolerating. -Give antiemetics 30 to 40 minutes prior to meals. -SLP following.  6.  Chronic pain -Was on morphine prior to admission currently on Toradol as needed. -Palliative care consulted for symptom management in light of history of uncontrolled pain. -Currently on Dilaudid as needed for palliative care. -Patient placed back on home regimen MS IR 15 mg every 6 hours as needed for breakthrough pain. -Try to wean off IV Dilaudid.  7.  A-fib -Noted to be transient A-fib in the ICU that is quickly resolved. -Currently rate controlled.  8.  Chronic prostatitis -Status post IV Unasyn. -Continue home regimen Bactrim and oxybutynin.   9.  Bladder calculi -Outpatient follow-up with urology.  10.  Anemia -Hemoglobin stable at 8.5. -Follow H&H. -Transfusion threshold hemoglobin < 7.    DVT prophylaxis: Lovenox Code Status: Full Family Communication: Updated patient, and son at bedside.  Disposition: Patient currently medically stable.  Awaiting inpatient psychiatric admission.    Status is: Inpatient Remains inpatient appropriate because: Needs inpatient psychiatric admission once medically stable.   Consultants:  Neurology: Dr.Lindzen 05/15/2022 PCCM: Dr. Lake Bells 05/15/2022 Psychiatry: Sheran Fava FNP, 05/18/2022 Palliative care: Dr. Rowe Pavy 05/21/2022 Wound care RN, Phineas Douglas 05/24/2022  Procedures:  CT head 05/13/2022, 05/15/2022 CT chest abdomen and pelvis 05/12/2022 Abdominal films 05/16/2022 Chest x-ray 05/15/2022, 05/13/2022 MRI brain 05/16/2022 Right upper quadrant ultrasound 04/21/2022 EEG  Significant Hospital Events: Including procedures,  antibiotic start and stop dates in addition to other pertinent events   10/28 admit obtunded found down  10/30 tx to ICU for intubation 10/31 waking up and following commands overnight.  11/1 SBT. Extubated 11/2 reveals that ingestion of pills at home was in fact a suicide attempt. 1:1 sitter, psych consult. Transfer out of ICU   Antimicrobials:  Anti-infectives (From admission, onward)    Start     Dose/Rate Route Frequency Ordered Stop   05/25/22 2200  sulfamethoxazole-trimethoprim (BACTRIM DS) 800-160 MG per tablet 1 tablet       Note to Pharmacy: Patient taking differently: 25 day course. From 10/18-11/12     1 tablet Oral Every 12 hours 05/25/22 1529 06/03/22 2159   05/15/22 1830  Ampicillin-Sulbactam (UNASYN) 3 g in sodium chloride 0.9 % 100 mL IVPB        3 g 200 mL/hr over 30 Minutes Intravenous Every 6 hours 05/15/22 1821 05/20/22 1629         Subjective: Laying in bed.  Complaining of some neck pain.  Still with complaints of nausea.  Tolerating dysphagia 3 diet.  Still with complaints of chronic abdominal pain.  Son at bedside.   Objective: Vitals:   05/27/22 0542 05/27/22 1333 05/27/22 2019 05/28/22 0513  BP: (!) 116/57 (!) 113/56 120/62 (!) 114/55  Pulse: 83 78 72 75  Resp: 17 18 16 16   Temp: 98.2 F (36.8 C) 98.2 F (36.8 C) 98.1 F (36.7 C) 97.9 F (36.6 C)  TempSrc: Oral Oral Oral Oral  SpO2: 97% 97% 96% 96%  Weight:      Height:        Intake/Output Summary (Last 24 hours) at 05/28/2022 1153 Last data filed at 05/28/2022 0951 Gross per 24 hour  Intake 128.12 ml  Output 1400 ml  Net -1271.88 ml    Filed Weights   05/15/22 1400 05/15/22 1727 05/26/22 1215  Weight: 66.3 kg 64.7 kg 68.1 kg    Examination:  General exam: NAD. Respiratory system: Lungs auscultation bilaterally anterior lung fields.  No wheezes, no crackles, no rhonchi.  Fair air movement.  Speaking in full sentences.   Cardiovascular system: RRR no murmurs rubs or gallops.  No JVD.   No lower extremity edema.  Gastrointestinal system: Abdomen is soft, nondistended, chronic diffuse tenderness to palpation.  Positive bowel sounds.  No rebound.  No guarding.  Ileostomy bag with loose stool.  Central nervous system: Alert and oriented. No focal neurological deficits. Extremities: Symmetric 5 x 5 power. Skin: No rashes, lesions or ulcers Psychiatry: Judgement and insight appear fair. Mood & affect appropriate.     Data Reviewed: I have personally reviewed following labs and imaging studies  CBC: Recent Labs  Lab 05/24/22 1044 05/25/22 0226 05/26/22 0441  WBC 6.7 7.1 7.3  NEUTROABS 3.9 4.5 5.2  HGB 9.1* 9.0* 8.5*  HCT 30.6* 29.6* 28.1*  MCV 79.5* 80.4 80.5  PLT 231 248 205     Basic Metabolic Panel: Recent Labs  Lab 05/22/22 0257 05/24/22 1044 05/25/22 0226 05/26/22 0441 05/27/22 0204 05/28/22 0303  NA 136 141 140 140 139 138  K 3.8 4.1 3.6 4.0 3.8 3.9  CL 108 108 107 107 108 106  CO2 23 26 28 28 27 26   GLUCOSE 96 100* 100* 108* 100* 106*  BUN 26* 23 24* 26* 24* 22  CREATININE 0.77 0.75 0.79 0.83 0.92 0.92  CALCIUM 8.2* 8.6* 8.4* 8.5* 8.3* 8.3*  MG 2.0 2.0 1.8  --  1.9  --   PHOS 2.6  --  3.3  --   --   --  GFR: Estimated Creatinine Clearance: 73 mL/min (by C-G formula based on SCr of 0.92 mg/dL).  Liver Function Tests: Recent Labs  Lab 05/22/22 0257 05/24/22 1044 05/25/22 0226 05/27/22 0204 05/28/22 0303  AST 103* 74* 63* 33 28  ALT 135* 122* 109* 61* 48*  ALKPHOS 169* 219* 220* 174* 156*  BILITOT 0.5 0.6 0.5 0.5 0.4  PROT 6.0* 6.0* 6.2* 5.6* 6.2*  ALBUMIN 2.4* 2.5* 2.4* 2.3* 2.4*     CBG: Recent Labs  Lab 05/26/22 0617 05/26/22 1303 05/26/22 2034 05/27/22 1238 05/27/22 1957  GLUCAP 120* 93 116* 91 110*      No results found for this or any previous visit (from the past 240 hour(s)).        Radiology Studies: No results found.      Scheduled Meds:  busPIRone  5 mg Oral BID   Chlorhexidine Gluconate  Cloth  6 each Topical Daily   enoxaparin (LOVENOX) injection  40 mg Subcutaneous Q24H   insulin aspart  0-9 Units Subcutaneous 2 times per day   lidocaine  1 patch Transdermal Q24H   oxybutynin  5 mg Oral TID   pantoprazole  40 mg Oral Daily   psyllium  1 packet Oral Daily   sertraline  50 mg Oral Daily   simethicone  80 mg Oral TID WC & HS   sulfamethoxazole-trimethoprim  1 tablet Oral Q12H   tamsulosin  0.4 mg Oral QPC supper   Continuous Infusions:  TPN CYCLIC-ADULT (ION)       LOS: 15 days    Time spent: 35 minutes    Irine Seal, MD [facility]   To contact the attending provider between 7A-7P or the covering provider during after hours 7P-7A, please log into the web site [email or web address] and access using universal [place] password for that web site. If you do not have the password, please call the hospital operator.  05/28/2022, 11:53 AM

## 2022-05-29 DIAGNOSIS — E43 Unspecified severe protein-calorie malnutrition: Secondary | ICD-10-CM | POA: Diagnosis not present

## 2022-05-29 DIAGNOSIS — J9601 Acute respiratory failure with hypoxia: Secondary | ICD-10-CM | POA: Diagnosis not present

## 2022-05-29 DIAGNOSIS — D649 Anemia, unspecified: Secondary | ICD-10-CM | POA: Diagnosis not present

## 2022-05-29 DIAGNOSIS — G9341 Metabolic encephalopathy: Secondary | ICD-10-CM | POA: Diagnosis not present

## 2022-05-29 LAB — GLUCOSE, CAPILLARY
Glucose-Capillary: 122 mg/dL — ABNORMAL HIGH (ref 70–99)
Glucose-Capillary: 83 mg/dL (ref 70–99)
Glucose-Capillary: 99 mg/dL (ref 70–99)
Glucose-Capillary: 124 mg/dL — ABNORMAL HIGH (ref 70–99)

## 2022-05-29 LAB — COMPREHENSIVE METABOLIC PANEL
ALT: 42 U/L (ref 0–44)
AST: 25 U/L (ref 15–41)
Albumin: 2.5 g/dL — ABNORMAL LOW (ref 3.5–5.0)
Alkaline Phosphatase: 158 U/L — ABNORMAL HIGH (ref 38–126)
Anion gap: 6 (ref 5–15)
BUN: 25 mg/dL — ABNORMAL HIGH (ref 8–23)
CO2: 25 mmol/L (ref 22–32)
Calcium: 8.2 mg/dL — ABNORMAL LOW (ref 8.9–10.3)
Chloride: 105 mmol/L (ref 98–111)
Creatinine, Ser: 1.01 mg/dL (ref 0.61–1.24)
GFR, Estimated: >60 mL/min (ref >60)
Glucose, Bld: 106 mg/dL — ABNORMAL HIGH (ref 70–99)
Potassium: 4.1 mmol/L (ref 3.5–5.1)
Sodium: 136 mmol/L (ref 135–145)
Total Bilirubin: 0.4 mg/dL (ref 0.3–1.2)
Total Protein: 6.4 g/dL — ABNORMAL LOW (ref 6.5–8.1)

## 2022-05-29 LAB — PHOSPHORUS: Phosphorus: 3.4 mg/dL (ref 2.5–4.6)

## 2022-05-29 LAB — TRIGLYCERIDES: Triglycerides: 51 mg/dL (ref <150)

## 2022-05-29 LAB — MAGNESIUM: Magnesium: 1.9 mg/dL (ref 1.7–2.4)

## 2022-05-29 MED ORDER — TRAVASOL 10 % IV SOLN
INTRAVENOUS | Status: AC
  Filled 2022-05-29: qty 1312.5

## 2022-05-29 MED ORDER — SERTRALINE HCL 50 MG PO TABS
75.0000 mg | ORAL_TABLET | Freq: Every day | ORAL | Status: DC
  Administered 2022-05-30 – 2022-06-01 (×3): 75 mg via ORAL
  Filled 2022-05-29 (×5): qty 1

## 2022-05-29 NOTE — Progress Notes (Signed)
PROGRESS NOTE    [name]  WCH:[id number] DOB: [birth year] DOA: 05/13/2022 PCP: [name], MD    Chief Complaint  Patient presents with   Altered Mental Status    Brief Narrative:  [phone] with hx Crohn's with persistent abd pain s/p extensive surgeries and is currently TPN dependent. Pt presented with unresponsiveness in apparent suicide attempt via overdose. Pt remained unresponsive and later was unable to maintain airway, prompting intubation and transfer to ICU. Pt now extubated and care transferred to hospitalist.     Assessment & Plan:   Principal Problem:   Metabolic encephalopathy Active Problems:   Chronic anemia   Acute respiratory failure with hypoxia (HCC)   Severe malnutrition (HCC)   Generalized weakness   Drug overdose, intentional, initial encounter ([place])   Chronic pain syndrome   Aspiration pneumonia ([place])  #1 intentional drug overdose/MDD -Patient noted to have intentionally taking pain medication and anxiolytics with intentional suicidal attempt. -Patient with some clinical improvement feeling stronger tolerating current clear diet. -Sitter at bedside. -Continue Zoloft 50 mg daily. -Patient was on Prozac and switched to liquid sertraline per psychiatry. -Patient started back on reduced home regimen of BuSpar 5 mg twice daily.  -Continue one-to-one sitter. -Psychiatry recommending inpatient psych admission when medically cleared. -Patient currently medically stable. -Psychiatry following pending admission to inpatient psychiatric facility.  2.  Toxic metabolic encephalopathy -Likely secondary to intentional medication overdose. -Patient clinically improved conversant following commands answering questions appropriately. -Head CT, MRI brain done negative for any acute abnormalities. -Continue supportive care.  3.  Aspiration pneumonia -Seen by speech therapy underwent MBS 05/22/2022 was initially on clear liquids and diet subsequently advanced  to a dysphagia 3 diet which he seems to be tolerating.  -Status post IV Unasyn.  4.  Crohn's disease -Status post multiple surgeries in the past. -TPN dependent.  5.  Severe protein calorie malnutrition -Currently on TPN. -Saline lock IV fluids. -Diet advanced to dysphagia 3 diet which he seems to be tolerating. -Give antiemetics 30 to 40 minutes prior to meals. -SLP following.  6.  Chronic pain -Was on morphine prior to admission currently on Toradol as needed. -Palliative care consulted for symptom management in light of history of uncontrolled pain. -Currently on Dilaudid as needed for palliative care. -Patient placed back on home regimen MSIR 15 mg every 6 hours as needed for breakthrough pain. -Try to wean off IV Dilaudid.  7.  A-fib -Noted to be transient A-fib in the ICU that is quickly resolved. -Currently rate controlled.  8.  Chronic prostatitis -Status post IV Unasyn. -Continue home regimen Bactrim and oxybutynin.   9.  Bladder calculi -Outpatient follow-up with urology.  10.  Anemia -Hemoglobin stable at 8.5. -Repeat labs pending. -Follow H&H. -Transfusion threshold hemoglobin < 7.    DVT prophylaxis: Lovenox Code Status: Full Family Communication: Updated patient, and son at bedside.  Disposition: Patient currently medically stable.  Awaiting inpatient psychiatric admission.    Status is: Inpatient Remains inpatient appropriate because: Needs inpatient psychiatric admission once medically stable.   Consultants:  Neurology: Dr.Lindzen 05/15/2022 PCCM: Dr. Lake Bells 05/15/2022 Psychiatry: Sheran Fava FNP, 05/18/2022 Palliative care: Dr. Rowe Pavy 05/21/2022 Wound care RN, Phineas Douglas 05/24/2022  Procedures:  CT head 05/13/2022, 05/15/2022 CT chest abdomen and pelvis 05/12/2022 Abdominal films 05/16/2022 Chest x-ray 05/15/2022, 05/13/2022 MRI brain 05/16/2022 Right upper quadrant ultrasound 04/21/2022 EEG  Significant Hospital  Events: Including procedures, antibiotic start and stop dates in addition to other pertinent events   10/28 admit obtunded  found down 10/30 tx to ICU for intubation 10/31 waking up and following commands overnight.  11/1 SBT. Extubated 11/2 reveals that ingestion of pills at home was in fact a suicide attempt. 1:1 sitter, psych consult. Transfer out of ICU   Antimicrobials:  Anti-infectives (From admission, onward)    Start     Dose/Rate Route Frequency Ordered Stop   05/25/22 2200  sulfamethoxazole-trimethoprim (BACTRIM DS) 800-160 MG per tablet 1 tablet       Note to Pharmacy: Patient taking differently: 25 day course. From 10/18-11/12     1 tablet Oral Every 12 hours 05/25/22 1529 06/03/22 2159   05/15/22 1830  Ampicillin-Sulbactam (UNASYN) 3 g in sodium chloride 0.9 % 100 mL IVPB        3 g 200 mL/hr over 30 Minutes Intravenous Every 6 hours 05/15/22 1821 05/20/22 1629         Subjective: Laying in bed.  States some improvement with nausea with adjustment in medications prior to his meals.  Still with complaints of chronic abdominal pain.  States just does not feel too well concerned may have a fever.  Son at bedside.  Objective: Vitals:   05/28/22 0513 05/28/22 1406 05/28/22 2231 05/29/22 0659  BP: (!) 114/55 108/62 123/60 (!) 121/58  Pulse: 75 74 79 79  Resp: 16 18 16 18   Temp: 97.9 F (36.6 C) (!) 97.5 F (36.4 C) 98.4 F (36.9 C) 98.6 F (37 C)  TempSrc: Oral  Oral Oral  SpO2: 96% 96% 96% 97%  Weight:      Height:        Intake/Output Summary (Last 24 hours) at 05/29/2022 1050 Last data filed at 05/29/2022 0756 Gross per 24 hour  Intake 1397.87 ml  Output 2045 ml  Net -647.13 ml    Filed Weights   05/15/22 1400 05/15/22 1727 05/26/22 1215  Weight: 66.3 kg 64.7 kg 68.1 kg    Examination:  General exam: NAD. Respiratory system: CTA B anterior lung fields.  No wheezes, no crackles, no rhonchi.  Fair air movement.  Speaking in full sentences.   Cardiovascular system: Regular rate rhythm no murmurs rubs or gallops.  No JVD.  No lower extremity edema.  Gastrointestinal system: Abdomen is soft, nondistended, chronic diffuse tenderness to palpation.  Positive bowel sounds.  No rebound.  No guarding.  Ileostomy bag with loose stool.  Central nervous system: Alert and oriented. No focal neurological deficits. Extremities: Symmetric 5 x 5 power. Skin: No rashes, lesions or ulcers Psychiatry: Judgement and insight appear fair. Mood & affect appropriate.     Data Reviewed: I have personally reviewed following labs and imaging studies  CBC: Recent Labs  Lab 05/24/22 1044 05/25/22 0226 05/26/22 0441  WBC 6.7 7.1 7.3  NEUTROABS 3.9 4.5 5.2  HGB 9.1* 9.0* 8.5*  HCT 30.6* 29.6* 28.1*  MCV 79.5* 80.4 80.5  PLT 231 248 205     Basic Metabolic Panel: Recent Labs  Lab 05/24/22 1044 05/25/22 0226 05/26/22 0441 05/27/22 0204 05/28/22 0303 05/29/22 0214  NA 141 140 140 139 138 136  K 4.1 3.6 4.0 3.8 3.9 4.1  CL 108 107 107 108 106 105  CO2 26 28 28 27 26 25   GLUCOSE 100* 100* 108* 100* 106* 106*  BUN 23 24* 26* 24* 22 25*  CREATININE 0.75 0.79 0.83 0.92 0.92 1.01  CALCIUM 8.6* 8.4* 8.5* 8.3* 8.3* 8.2*  MG 2.0 1.8  --  1.9  --  1.9  PHOS  --  3.3  --   --   --  3.4     GFR: Estimated Creatinine Clearance: 66.5 mL/min (by C-G formula based on SCr of 1.01 mg/dL).  Liver Function Tests: Recent Labs  Lab 05/24/22 1044 05/25/22 0226 05/27/22 0204 05/28/22 0303 05/29/22 0214  AST 74* 63* 33 28 25  ALT 122* 109* 61* 48* 42  ALKPHOS 219* 220* 174* 156* 158*  BILITOT 0.6 0.5 0.5 0.4 0.4  PROT 6.0* 6.2* 5.6* 6.2* 6.4*  ALBUMIN 2.5* 2.4* 2.3* 2.4* 2.5*     CBG: Recent Labs  Lab 05/27/22 1957 05/28/22 0509 05/28/22 1154 05/28/22 2032 05/29/22 0733  GLUCAP 110* 107* 114* 95 122*      No results found for this or any previous visit (from the past 240 hour(s)).        Radiology Studies: No results  found.      Scheduled Meds:  busPIRone  5 mg Oral BID   Chlorhexidine Gluconate Cloth  6 each Topical Daily   enoxaparin (LOVENOX) injection  40 mg Subcutaneous Q24H   insulin aspart  0-9 Units Subcutaneous 2 times per day   lidocaine  1 patch Transdermal Q24H   oxybutynin  5 mg Oral TID   pantoprazole  40 mg Oral Daily   psyllium  1 packet Oral Daily   sertraline  50 mg Oral Daily   simethicone  80 mg Oral TID WC & HS   sulfamethoxazole-trimethoprim  1 tablet Oral Q12H   tamsulosin  0.4 mg Oral QPC supper   Continuous Infusions:  TPN CYCLIC-ADULT (ION) 507 mL/hr at 22/57/50 5183   TPN CYCLIC-ADULT (ION)       LOS: 16 days    Time spent: 35 minutes    Irine Seal, MD [facility]   To contact the attending provider between 7A-7P or the covering provider during after hours 7P-7A, please log into the web site [email or web address] and access using universal [place] password for that web site. If you do not have the password, please call the hospital operator.  05/29/2022, 10:50 AM

## 2022-05-29 NOTE — Progress Notes (Signed)
Physical Therapy Treatment Patient Details Name: [name] MRN: [id number] DOB: 1950-09-07 Today's Date: 05/29/2022   History of Present Illness 71 year old male with complicated past medical history as outlined below, which is significant for Crohn's disease with persistent abdominal pain.  On April of this year he underwent exploratory laparotomy with small bowel resection, repair of enterotomy, and creation of a diverting loop ileostomy at [facility].  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  He has been residing in [facility] from where he presented to the emergency department on 10/27 for pain at the site of his ostomy with foul-smelling discharge.  Work-up of the ostomy and abdomen including CT imaging was unremarkable.  He was discharged to SNF, however, rather than returning to SNF he found private transport to his home.Then on 10/28 he again presented to [facility] emergency department with chief complaint of unresponsiveness.11/2 reveals that ingestion of pills at home was in fact a suicide attempt. 1:1 sitter, psych consult    PT Comments    Patient c/o of 6/10 pain in right side of ABD at beginning of session and 5/10 at end of session. Required mod assist for bed mobility to bring trunk upright and LE's off of bed, pt was able to scoot himself to EOB. Required min assist +1 for transfers for safety. Pt nervous with complaints of mild dizziness during initial standing; Min assist +2 needed for recliner follow with ambulation. Pt needed one rest break due to fatigue/complaints of feeling dizzy along with nervousnes. Vitals WNL. After rest break pt stated "my goal is to walk to that blue thing" (steady in hallway about 20 ft away.) Pt was able to ambulate further than his goal. Pt will need ST Rehab at SNF to address mobility and functional decline prior to safely returning home.   Ambulation                 BP: 106/65       RA:95% At end of session        BP: 114/55           RA: 98%   Recommendations for follow up therapy are one component of a multi-disciplinary discharge planning process, led by the attending physician.  Recommendations may be updated based on patient status, additional functional criteria and insurance authorization.  Follow Up Recommendations  Skilled nursing-short term rehab (<3 hours/day) Can patient physically be transported by private vehicle: No   Assistance Recommended at Discharge Frequent or constant Supervision/Assistance  Patient can return home with the following A little help with bathing/dressing/bathroom;Assist for transportation;Help with stairs or ramp for entrance;A lot of help with walking and/or transfers   Equipment Recommendations  None recommended by PT    Recommendations for Other Services       Precautions / Restrictions Precautions Precautions: Fall Precaution Comments: right colostomy Restrictions Weight Bearing Restrictions: No     Mobility  Bed Mobility Overal bed mobility: Needs Assistance Bed Mobility: Supine to Sit     Supine to sit: Mod assist     General bed mobility comments: assist for trunk upright and LE's over EOB; patient was able to scoot himself to EOB.    Transfers Overall transfer level: Needs assistance Equipment used: Rolling walker (2 wheels) Transfers: Sit to/from Stand Sit to Stand: Min assist           General transfer comment: min assist +1 needed for transfer. Pt nervous and c/o of mild dizziness when sitting EOB.  Ambulation/Gait Ambulation/Gait assistance: Min assist, +2 safety/equipment Gait Distance (Feet): 35 Feet Assistive device: Rolling walker (2 wheels) Gait Pattern/deviations: Step-through pattern, Trunk flexed, Decreased stride length Gait velocity: decreased     General Gait Details: min assist +2 needed for recliner follow. Pt needed one rest rest break due to fatigue/complaints of feeling dizzy along with nervousnes. Vitals WNL. After rest  break pt stated "my goal is to walk to that blue thing (steady in hallway about 20 ft away.) Pt was able to ambulate further than his goal.   Stairs             Wheelchair Mobility    Modified Rankin (Stroke Patients Only)       Balance                                            Cognition Arousal/Alertness: Awake/alert Behavior During Therapy: WFL for tasks assessed/performed Overall Cognitive Status: Within Functional Limits for tasks assessed Area of Impairment: Following commands, Safety/judgement                 Orientation Level: Time Current Attention Level: Focused   Following Commands: Follows one step commands with increased time   Awareness: Intellectual   General Comments: AxO x 3 very quiet, soft spoken, cooperative.  C/O 610 ABD pain at rest and 5/10 ABD pain at end of session. Also c/o of dizziness throughout session, monitored vitals.        Exercises      General Comments        Pertinent Vitals/Pain Pain Assessment Pain Score: 6  Pain Location: ABD "middle" Pain Descriptors / Indicators: Discomfort, Grimacing Pain Intervention(s): Monitored during session, Premedicated before session, Limited activity within patient's tolerance    Home Living                          Prior Function            PT Goals (current goals can now be found in the care plan section) Acute Rehab PT Goals Patient Stated Goal: to get better PT Goal Formulation: With patient/family Time For Goal Achievement: 06/01/22 Potential to Achieve Goals: Fair Progress towards PT goals: Progressing toward goals    Frequency    Min 2X/week      PT Plan Current plan remains appropriate    Co-evaluation              AM-PAC PT "6 Clicks" Mobility   Outcome Measure  Help needed turning from your back to your side while in a flat bed without using bedrails?: A Lot Help needed moving from lying on your back to sitting on  the side of a flat bed without using bedrails?: A Lot Help needed moving to and from a bed to a chair (including a wheelchair)?: A Lot Help needed standing up from a chair using your arms (e.g., wheelchair or bedside chair)?: A Lot Help needed to walk in hospital room?: A Lot Help needed climbing 3-5 steps with a railing? : Total 6 Click Score: 11    End of Session Equipment Utilized During Treatment: Gait belt Activity Tolerance: Patient limited by fatigue Patient left: with nursing/sitter in room;in chair;with call bell/phone within reach Nurse Communication: Mobility status PT Visit Diagnosis: Difficulty in walking, not elsewhere classified (R26.2)     Time:  -  Charges:                           Allyson Sabal 05/29/2022, 2:15 PM

## 2022-05-29 NOTE — Progress Notes (Signed)
Nutrition Follow-up  DOCUMENTATION CODES:  Severe malnutrition in context of chronic illness  INTERVENTION:  -Continue dysphagia 3 diet per SLP recommendations -Continue current TPN: 131.25g amino acids(1.9g/kg), 288.75g dextrose (GIR 3.93), 50.4g lipds (25%kcal), to provide 2010kcal (29.5kcal/kg) -Monitor weight, labs and intake and adjust nutrition support as clinically appropriate  NUTRITION DIAGNOSIS:  Severe Malnutrition related to chronic illness as evidenced by percent weight loss, severe fat depletion, severe muscle depletion.  GOAL:  Patient will meet greater than or equal to 90% of their needs  MONITOR:  Diet advancement, Labs, Other (Comment) (TPN)  REASON FOR ASSESSMENT:  Consult New TPN/TNA  ASSESSMENT:  Pt is a 71yo M with PMH of Crohn's disease resulting in a complicated surgical history followed at [facility] to include left hemicolectomy and ileostomy with multiple other complications to include TPN dependence, chronic prostatitis, SVT, and asthma who presents with generalized weakness, acute worsening of his chronic abdominal pain and AMS.  Pt assessed by SLP and diet advanced to dysphagia 3. Pt with variable acceptance from 0-50% of meals. Reviewed menu choices and pt is choosing balanced meals. Continues on TPN, cycled to 18hrs. Current order: 131.25g amino acids(1.9g/kg), 288.75g dextrose (GIR 3.93), 50.4g lipds (25%kcal), to provide 2010kcal (29.5kcal/kg). Pt is meeting estimated nutrient needs. Desirable weight gain during admission, from 67.2kg to 68.1kg in 2 weeks. Labs reviewed, ALT and AST WNL, Alk Phos improving, BG within acceptable limits.  With weight gain, adequate labs, and tolerating po diet will hold ONS at this time.  Labs: Na:136, K:4.1, XB:939-030, BUN:25, Cr:1.01, Phos:3.4, Mg:1.9, ALT:42, AST:25, Alk Phos:158  Diet Order:   Diet Order             DIET DYS 3 Room service appropriate? Yes; Fluid consistency: Thin  Diet effective now                    EDUCATION NEEDS:  Education needs have been addressed  Skin:  Skin Assessment: Reviewed RN Assessment  Last BM:  11/13-ostomy  Height:  Ht Readings from Last 1 Encounters:  05/26/22 _0  (1.778 m)    Weight:  Wt Readings from Last 1 Encounters:  05/26/22 68.1 kg    BMI:  Body mass index is 21.54 kg/m.  Estimated Nutritional Needs:  Kcal:  2015-2350kcal Protein:  100-135g Fluid:  2431m (Holliday Segar)  KCandise Bowens MS, RD, LDN, CNSC See AMiON for contact information

## 2022-05-29 NOTE — Consult Note (Signed)
Patient seen and attempted to assess, however patient observed in bedside chair with audible snoring.  Safety sitter unable to identify how long patient has been asleep.  The decision was made to allow patient to rest, stating he did not have a peaceful night.  This nurse practitioner did contact patient's wife and provide her an update.  All questions, comments, and concerns were addressed by this psychiatric provider.  Psychiatry consult team will attempt to reassess tomorrow. -Will increase Zoloft to 75 mg p.o. daily, with plans to increase Zoloft 100 mg p.o. daily on November 15/16.

## 2022-05-29 NOTE — Progress Notes (Signed)
PHARMACY - TOTAL PARENTERAL NUTRITION CONSULT NOTE   Indication: Crohn's disease and complex past surgical history including ex lap with small bowel resection, lysis of adhesion, mesh explantation, and diverting loop ileostomy in April 2023, on TPN prior to admission  Patient Measurements: Height: _0  (177.8 cm) Weight: 68.1 kg (150 lb 1.6 oz) IBW/kg (Calculated) : 73 TPN AdjBW (KG): 68.1 Body mass index is 21.54 kg/m.  Assessment: Patient with PMH of Crohn's disease and complex surgical history as above on chronic TPN who presented on 05/13/22 with altered mental status after spouse found patient unresponsive with multiple pill bottles open around him. Patient was transported from SNF to ED the day prior for generalized weakness and acute worsening of his chronic abdominal pain. Patient decided to return home instead of his SNF. Pharmacy consulted to resume TPN as inpatient.   Discharge from [place] 04/19/22:  TPN pharmacist at [place]:  Estimated Nutrient Requirements: Protein: 1.5 - 1.7 g/kg/day (98 - 104 g/day), Total kcal: 30 - 35 kcal/kg/day (1830 - 1983 kcal/day)  TPN Formulation at [place] per TPN pharmacist: [A 6.6%, D 20%, F 3.1%] @ goal rate, 65 mL/hr (TV: 1560 mL/day) provides: Protein 1.7 g/kg/day 103 g/day, Total kcal 32 kcal/kg/day 1971 kcal/day  NPC: N ratio 94   Admitted at [place] 04/29/22-05/03/22: Nutritional Goals: TPN 80 mL/hr (provides 113 g of protein and 1943 kcals per day) RD Assessment: (10/16): 2100-2450kcal (30-35kcal/kg), Protein 105-130g (1.5-1.8g/kg) Utilize intralipid rather than SMOF as 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  [facility]: [name], PharmD, ph: [phone] 10/28 Most recent formula: Total volume 1560 mL:  Dextrose 220 g/day, Plenamine 120 g/day, SMOFlipid 50 g/day, NaCl 75 mEq, CaGluc 6 mEq, Potassium Ac 20 mEq, Potassium Phos 13.5 nM, Mag Sulfate 8 mEq, Vitamins/trace  elements  Glucose / Insulin: No hx DM.  - on sSSI (has not used any insulin in the past 24 hrs) - cbgs (goal <150): wnl Electrolytes: Na trending down with 136 (will watch closely for now); other lytes wnl including CorrCa Renal: SCr 1.01 (crcl~66); BUN slightly elevated at 25; UOP remains good Hepatic:  - AST/ALT bumped on 11/6, but now down to wnl - Alk phos elevated  - Albumin low 2.5 - TG 51 (11/13) I/O: - 597 mL - ileostomy output 400 ml/day - osmolality 298 slightly elevated - MIVF: dc'd 11/11 GI Imaging: - 11/6 abd [place] - normal liver (checked d/t elevated LFTs) - 10/27 CTa/p: no significant interval change since 10/14 CT. No evidence of bowel obstruction or other complicating feature at the anastomotic site or ostomy. - 10/14 CTa/p: previous surgical changes noted (transverse-sigmoid anastomosis) with stable inflammation since 10/12 CT GI Surgeries / Procedures: n/a  Central access: prior to admission - double lumen CVC TPN start date: started PTA  Nutritional Goals: TPN provides 131 g of protein and 2010 kcals per day.  RD Assessment this admission:    Estimated Needs Total Energy Estimated Needs: 2015-2350kcal Total Protein Estimated Needs: 100-135g Total Fluid Estimated Needs: 2446m (Holliday Segar)  Current Nutrition:  TPN Dysphagia-3 101/6>>   Plan:  Cyclic TPN with 25537ml over 18 hr cycle GIR 20.9 - 4.17 Electrolytes in TPN:  Na 75 mEq/L K 45 mEq/L Ca 5 mEq/L Mg 4 mEq/L Phos 15 mmol/L Cl:Ac = 1:1 Add standard MVI and trace elements to TPN continue pepcid in TPN  With excellent CBGs, will limit finger sticks to twice daily -  2 hrs after TPN start, 1 hour after TPN stop IVF dc'd by MD Monitor TPN labs on Mon/Thurs at minimum  Dia Sitter, PharmD, BCPS 05/29/2022 7:06 AM

## 2022-05-30 DIAGNOSIS — G9341 Metabolic encephalopathy: Secondary | ICD-10-CM | POA: Diagnosis not present

## 2022-05-30 DIAGNOSIS — J9601 Acute respiratory failure with hypoxia: Secondary | ICD-10-CM | POA: Diagnosis not present

## 2022-05-30 DIAGNOSIS — D649 Anemia, unspecified: Secondary | ICD-10-CM | POA: Diagnosis not present

## 2022-05-30 DIAGNOSIS — R401 Stupor: Secondary | ICD-10-CM | POA: Diagnosis not present

## 2022-05-30 DIAGNOSIS — E43 Unspecified severe protein-calorie malnutrition: Secondary | ICD-10-CM | POA: Diagnosis not present

## 2022-05-30 LAB — COMPREHENSIVE METABOLIC PANEL
ALT: 35 U/L (ref 0–44)
AST: 25 U/L (ref 15–41)
Albumin: 2.6 g/dL — ABNORMAL LOW (ref 3.5–5.0)
Alkaline Phosphatase: 157 U/L — ABNORMAL HIGH (ref 38–126)
Anion gap: 6 (ref 5–15)
BUN: 25 mg/dL — ABNORMAL HIGH (ref 8–23)
CO2: 26 mmol/L (ref 22–32)
Calcium: 8.5 mg/dL — ABNORMAL LOW (ref 8.9–10.3)
Chloride: 106 mmol/L (ref 98–111)
Creatinine, Ser: 0.94 mg/dL (ref 0.61–1.24)
GFR, Estimated: >60 mL/min (ref >60)
Glucose, Bld: 104 mg/dL — ABNORMAL HIGH (ref 70–99)
Potassium: 3.9 mmol/L (ref 3.5–5.1)
Sodium: 138 mmol/L (ref 135–145)
Total Bilirubin: 0.5 mg/dL (ref 0.3–1.2)
Total Protein: 6.1 g/dL — ABNORMAL LOW (ref 6.5–8.1)

## 2022-05-30 LAB — CBC WITH DIFFERENTIAL/PLATELET
Abs Immature Granulocytes: 0.03 10*3/uL (ref 0.00–0.07)
Basophils Absolute: 0 10*3/uL (ref 0.0–0.1)
Basophils Relative: 0 %
Eosinophils Absolute: 0.3 10*3/uL (ref 0.0–0.5)
Eosinophils Relative: 5 %
HCT: 27.4 % — ABNORMAL LOW (ref 39.0–52.0)
Hemoglobin: 8.1 g/dL — ABNORMAL LOW (ref 13.0–17.0)
Immature Granulocytes: 1 %
Lymphocytes Relative: 31 %
Lymphs Abs: 1.9 10*3/uL (ref 0.7–4.0)
MCH: 23.7 pg — ABNORMAL LOW (ref 26.0–34.0)
MCHC: 29.6 g/dL — ABNORMAL LOW (ref 30.0–36.0)
MCV: 80.1 fL (ref 80.0–100.0)
Monocytes Absolute: 0.6 10*3/uL (ref 0.1–1.0)
Monocytes Relative: 10 %
Neutro Abs: 3.3 10*3/uL (ref 1.7–7.7)
Neutrophils Relative %: 53 %
Platelets: 250 10*3/uL (ref 150–400)
RBC: 3.42 MIL/uL — ABNORMAL LOW (ref 4.22–5.81)
RDW: 18.2 % — ABNORMAL HIGH (ref 11.5–15.5)
WBC: 6 10*3/uL (ref 4.0–10.5)
nRBC: 0 % (ref 0.0–0.2)

## 2022-05-30 LAB — MAGNESIUM: Magnesium: 2 mg/dL (ref 1.7–2.4)

## 2022-05-30 LAB — PHOSPHORUS: Phosphorus: 3.6 mg/dL (ref 2.5–4.6)

## 2022-05-30 LAB — GLUCOSE, CAPILLARY
Glucose-Capillary: 128 mg/dL — ABNORMAL HIGH (ref 70–99)
Glucose-Capillary: 106 mg/dL — ABNORMAL HIGH (ref 70–99)

## 2022-05-30 MED ORDER — TRAVASOL 10 % IV SOLN
INTRAVENOUS | Status: AC
  Filled 2022-05-30: qty 1312.5

## 2022-05-30 NOTE — TOC Progression Note (Signed)
Transition of Care [facility]) - Progression Note    Patient Details  Name: SHAW DOBEK MRN: [id number] Date of Birth: 04-16-1951  [facility]) CM/SW Contact  Lennart Pall, LCSW Phone Number: 05/30/2022, 3:15 PM  Clinical Narrative:     Have updated pt's family that [facility] continues to work on securing an Inpatient psychiatry bed.  Have submitted a referral today to [facility] due to multiple declines from other facilities.  Continue to follow.  Expected Discharge Plan:  (TBD) Barriers to Discharge: Continued Medical Work up  Expected Discharge Plan and Services Expected Discharge Plan:  (TBD)   Discharge Planning Services: CM Consult Post Acute Care Choice: [place] Living arrangements for the past 2 months: Single Family Home                                       Social Determinants of Health (SDOH) Interventions    Readmission Risk Interventions    05/15/2022   10:53 AM  Readmission Risk Prevention Plan  Transportation Screening Complete  Medication Review (RN Care Manager) Complete  PCP or Specialist appointment within 3-5 days of discharge Complete  HRI or Home Care Consult Complete  SW Recovery Care/Counseling Consult Complete  [place] Complete

## 2022-05-30 NOTE — Care Management Important Message (Signed)
Important Message  Patient Details  Name: [name] MRN: [id number] Date of Birth: 07/15/1951   Medicare Important Message Given:  Yes     Memory Argue 05/30/2022, 2:01 PM

## 2022-05-30 NOTE — Progress Notes (Signed)
PHARMACY - TOTAL PARENTERAL NUTRITION CONSULT NOTE   Indication: Crohn's disease and complex past surgical history including ex lap with small bowel resection, lysis of adhesion, mesh explantation, and diverting loop ileostomy in April 2023, on TPN prior to admission  Patient Measurements: Height: _0  (177.8 cm) Weight: 68.1 kg (150 lb 1.6 oz) IBW/kg (Calculated) : 73 TPN AdjBW (KG): 68.1 Body mass index is 21.54 kg/m.  Assessment: Patient with PMH of Crohn's disease and complex surgical history as above on chronic TPN who presented on 05/13/22 with altered mental status after spouse found patient unresponsive with multiple pill bottles open around him. Patient was transported from SNF to ED the day prior for generalized weakness and acute worsening of his chronic abdominal pain. Patient decided to return home instead of his SNF. Pharmacy consulted to resume TPN as inpatient.   Discharge from [place] 04/19/22:  TPN pharmacist at [place]:  Estimated Nutrient Requirements: Protein: 1.5 - 1.7 g/kg/day (98 - 104 g/day), Total kcal: 30 - 35 kcal/kg/day (1830 - 1983 kcal/day)  TPN Formulation at [place] per TPN pharmacist: [A 6.6%, D 20%, F 3.1%] @ goal rate, 65 mL/hr (TV: 1560 mL/day) provides: Protein 1.7 g/kg/day 103 g/day, Total kcal 32 kcal/kg/day 1971 kcal/day  NPC: N ratio 94   Admitted at [place] 04/29/22-05/03/22: Nutritional Goals: TPN 80 mL/hr (provides 113 g of protein and 1943 kcals per day) RD Assessment: (10/16): 2100-2450kcal (30-35kcal/kg), Protein 105-130g (1.5-1.8g/kg) Utilize intralipid rather than SMOF as 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  [facility]: [name], PharmD, ph: [phone] 10/28 Most recent formula: Total volume 1560 mL:  Dextrose 220 g/day, Plenamine 120 g/day, SMOFlipid 50 g/day, NaCl 75 mEq, CaGluc 6 mEq, Potassium Ac 20 mEq, Potassium Phos 13.5 nM, Mag Sulfate 8 mEq, Vitamins/trace  elements  Glucose / Insulin: No hx DM.  - on sSSI (used 1 unit of insulin in the past 24 hrs) - cbgs (goal <150): wnl Electrolytes: lytes wnl Renal: SCr ok; BUN slightly elevated at 25; UOP remains good Hepatic:  - AST/ALT bumped on 11/6, but now down to wnl - Alk phos elevated  - Albumin low 2.5 - TG 51 (11/13) I/O: - 1134 mL - ileostomy output 550 ml/day - osmolality 298 slightly elevated - MIVF: dc'd 11/11 GI Imaging: - 11/6 abd [place] - normal liver (checked d/t elevated LFTs) - 10/27 CTa/p: no significant interval change since 10/14 CT. No evidence of bowel obstruction or other complicating feature at the anastomotic site or ostomy. - 10/14 CTa/p: previous surgical changes noted (transverse-sigmoid anastomosis) with stable inflammation since 10/12 CT GI Surgeries / Procedures: n/a  Central access: prior to admission - double lumen CVC TPN start date: started PTA  Nutritional Goals: TPN provides 131 g of protein and 2010 kcals per day.  RD Assessment this admission:    Estimated Needs Total Energy Estimated Needs: 2015-2350kcal Total Protein Estimated Needs: 100-135g Total Fluid Estimated Needs: 2421m (Holliday Segar)  Current Nutrition:  TPN Dysphagia-3 138/1>>   Plan:  Cyclic TPN with 20175ml over 18 hr cycle GIR 20.9 - 4.17 Electrolytes in TPN:  Na 75 mEq/L K 45 mEq/L Ca 5 mEq/L Mg 4 mEq/L Phos 15 mmol/L Cl:Ac = 1:1 Add standard MVI and trace elements to TPN continue pepcid in TPN  With excellent CBGs, will limit finger sticks to twice daily - 2 hrs after TPN start, 1 hour after TPN stop IVF dc'd by MD  Monitor TPN labs on Mon/Thurs at minimum  Dia Sitter, PharmD, BCPS 05/30/2022 7:10 AM

## 2022-05-30 NOTE — Progress Notes (Signed)
PROGRESS NOTE    [name]  JSH:[id number] DOB: [birth year] DOA: 05/13/2022 PCP: [name], MD    Chief Complaint  Patient presents with   Altered Mental Status    Brief Narrative:  [phone] with hx Crohn's with persistent abd pain s/p extensive surgeries and is currently TPN dependent. Pt presented with unresponsiveness in apparent suicide attempt via overdose. Pt remained unresponsive and later was unable to maintain airway, prompting intubation and transfer to ICU. Pt now extubated and care transferred to hospitalist.     Assessment & Plan:   Principal Problem:   Metabolic encephalopathy Active Problems:   Chronic anemia   Acute respiratory failure with hypoxia (HCC)   Severe malnutrition (HCC)   Generalized weakness   Drug overdose, intentional, initial encounter ([place])   Chronic pain syndrome   Aspiration pneumonia ([place])  #1 intentional drug overdose/MDD -Patient noted to have intentionally taking pain medication and anxiolytics with intentional suicidal attempt. -Patient with some clinical improvement feeling stronger tolerating current clear diet. -Sitter at bedside. -Continue Zoloft 50 mg daily. -Patient was on Prozac and switched to liquid sertraline per psychiatry. -Patient started back on reduced home regimen of BuSpar 5 mg twice daily.  -Continue one-to-one sitter. -Psychiatry recommending inpatient psych admission when medically cleared. -Patient currently medically stable. -Psychiatry following pending admission to inpatient psychiatric facility.  2.  Toxic metabolic encephalopathy -Likely secondary to intentional medication overdose. -Patient clinically improved conversant following commands answering questions appropriately. -Head CT, MRI brain done negative for any acute abnormalities. -Continue supportive care.  3.  Aspiration pneumonia -Seen by speech therapy underwent MBS 05/22/2022 was initially on clear liquids and diet subsequently advanced  to a dysphagia 3 diet which he seems to be tolerating.  -Status post IV Unasyn.  4.  Crohn's disease -Status post multiple surgeries in the past. -TPN dependent.  5.  Severe protein calorie malnutrition -Currently on TPN. -Saline lock IV fluids. -Diet advanced to dysphagia 3 diet which he seems to be tolerating. -Give antiemetics 30 to 40 minutes prior to meals. -SLP following.  6.  Chronic pain -Was on morphine prior to admission currently on Toradol as needed. -Palliative care consulted for symptom management in light of history of uncontrolled pain. -Currently on Dilaudid as needed for palliative care. -Patient placed back on reduced home regimen MSIR 15 mg every 6 hours as needed for breakthrough pain. -IV Dilaudid as needed.    7.  A-fib -Noted to be transient A-fib in the ICU that is quickly resolved. -Currently rate controlled.  8.  Chronic prostatitis -Status post IV Unasyn. -Continue home regimen Bactrim and oxybutynin.   9.  Bladder calculi -Outpatient follow-up with urology.  10.  Anemia -Hemoglobin stable at 8.1. -Follow H&H. -Transfusion threshold hemoglobin < 7.    DVT prophylaxis: Lovenox Code Status: Full Family Communication: Updated patient, and sister at bedside.  Disposition: Patient currently medically stable.  Awaiting inpatient psychiatric admission.    Status is: Inpatient Remains inpatient appropriate because: Needs inpatient psychiatric admission once medically stable.   Consultants:  Neurology: Dr.Lindzen 05/15/2022 PCCM: Dr. Lake Bells 05/15/2022 Psychiatry: Sheran Fava FNP, 05/18/2022 Palliative care: Dr. Rowe Pavy 05/21/2022 Wound care RN, Phineas Douglas 05/24/2022  Procedures:  CT head 05/13/2022, 05/15/2022 CT chest abdomen and pelvis 05/12/2022 Abdominal films 05/16/2022 Chest x-ray 05/15/2022, 05/13/2022 MRI brain 05/16/2022 Right upper quadrant ultrasound 04/21/2022 EEG  Significant Hospital Events: Including procedures,  antibiotic start and stop dates in addition to other pertinent events   10/28 admit obtunded found down  10/30 tx to ICU for intubation 10/31 waking up and following commands overnight.  11/1 SBT. Extubated 11/2 reveals that ingestion of pills at home was in fact a suicide attempt. 1:1 sitter, psych consult. Transfer out of ICU   Antimicrobials:  Anti-infectives (From admission, onward)    Start     Dose/Rate Route Frequency Ordered Stop   05/25/22 2200  sulfamethoxazole-trimethoprim (BACTRIM DS) 800-160 MG per tablet 1 tablet       Note to Pharmacy: Patient taking differently: 25 day course. From 10/18-11/12     1 tablet Oral Every 12 hours 05/25/22 1529 06/03/22 2159   05/15/22 1830  Ampicillin-Sulbactam (UNASYN) 3 g in sodium chloride 0.9 % 100 mL IVPB        3 g 200 mL/hr over 30 Minutes Intravenous Every 6 hours 05/15/22 1821 05/20/22 1629         Subjective: Laying in bed.  Some improvement with nausea after being given just prior to meals.  No change in chronic abdominal pain.  Sister at bedside.   Objective: Vitals:   05/29/22 1052 05/29/22 1128 05/29/22 2317 05/30/22 0734  BP: (!) 117/56 106/65 (!) 108/55 117/62  Pulse: 78 98 77 79  Resp: 20  18 18   Temp: 98.6 F (37 C)  97.9 F (36.6 C) 98.4 F (36.9 C)  TempSrc: Oral  Axillary Oral  SpO2: 98% 95% 98% 100%  Weight:      Height:        Intake/Output Summary (Last 24 hours) at 05/30/2022 1150 Last data filed at 05/30/2022 1002 Gross per 24 hour  Intake 1096 ml  Output 2650 ml  Net -1554 ml    Filed Weights   05/15/22 1400 05/15/22 1727 05/26/22 1215  Weight: 66.3 kg 64.7 kg 68.1 kg    Examination:  General exam: NAD. Respiratory system: CTA B.  No wheezes, no crackles, no rhonchi.  Fair air movement.  Speaking in full sentences.  Cardiovascular system: RRR no murmurs rubs or gallops.  No JVD.  No lower extremity edema.  Gastrointestinal system: Abdomen is soft, nondistended, chronic diffuse tenderness  to palpation, positive bowel sounds.  No rebound.  No guarding.  Ileostomy bag with loose stool.  Central nervous system: Alert and oriented. No focal neurological deficits. Extremities: Symmetric 5 x 5 power. Skin: No rashes, lesions or ulcers Psychiatry: Judgement and insight appear fair. Mood & affect depressed and flat.     Data Reviewed: I have personally reviewed following labs and imaging studies  CBC: Recent Labs  Lab 05/24/22 1044 05/25/22 0226 05/26/22 0441 05/30/22 0200  WBC 6.7 7.1 7.3 6.0  NEUTROABS 3.9 4.5 5.2 3.3  HGB 9.1* 9.0* 8.5* 8.1*  HCT 30.6* 29.6* 28.1* 27.4*  MCV 79.5* 80.4 80.5 80.1  PLT 231 248 205 250     Basic Metabolic Panel: Recent Labs  Lab 05/24/22 1044 05/25/22 0226 05/26/22 0441 05/27/22 0204 05/28/22 0303 05/29/22 0214 05/30/22 0200  NA 141 140 140 139 138 136 138  K 4.1 3.6 4.0 3.8 3.9 4.1 3.9  CL 108 107 107 108 106 105 106  CO2 26 28 28 27 26 25 26   GLUCOSE 100* 100* 108* 100* 106* 106* 104*  BUN 23 24* 26* 24* 22 25* 25*  CREATININE 0.75 0.79 0.83 0.92 0.92 1.01 0.94  CALCIUM 8.6* 8.4* 8.5* 8.3* 8.3* 8.2* 8.5*  MG 2.0 1.8  --  1.9  --  1.9 2.0  PHOS  --  3.3  --   --   --  3.4 3.6     GFR: Estimated Creatinine Clearance: 71.4 mL/min (by C-G formula based on SCr of 0.94 mg/dL).  Liver Function Tests: Recent Labs  Lab 05/25/22 0226 05/27/22 0204 05/28/22 0303 05/29/22 0214 05/30/22 0200  AST 63* 33 28 25 25   ALT 109* 61* 48* 42 35  ALKPHOS 220* 174* 156* 158* 157*  BILITOT 0.5 0.5 0.4 0.4 0.5  PROT 6.2* 5.6* 6.2* 6.4* 6.1*  ALBUMIN 2.4* 2.3* 2.4* 2.5* 2.6*     CBG: Recent Labs  Lab 05/28/22 2032 05/29/22 0733 05/29/22 1254 05/29/22 1652 05/29/22 1957  GLUCAP 95 122* 83 99 124*      No results found for this or any previous visit (from the past 240 hour(s)).        Radiology Studies: No results found.      Scheduled Meds:  busPIRone  5 mg Oral BID   Chlorhexidine Gluconate Cloth  6  each Topical Daily   enoxaparin (LOVENOX) injection  40 mg Subcutaneous Q24H   insulin aspart  0-9 Units Subcutaneous 2 times per day   lidocaine  1 patch Transdermal Q24H   oxybutynin  5 mg Oral TID   pantoprazole  40 mg Oral Daily   psyllium  1 packet Oral Daily   sertraline  75 mg Oral Daily   simethicone  80 mg Oral TID WC & HS   sulfamethoxazole-trimethoprim  1 tablet Oral Q12H   tamsulosin  0.4 mg Oral QPC supper   Continuous Infusions:  TPN CYCLIC-ADULT (ION) 62 mL/hr at 29/56/21 3086   TPN CYCLIC-ADULT (ION)       LOS: 17 days    Time spent: 35 minutes    Irine Seal, MD [facility]   To contact the attending provider between 7A-7P or the covering provider during after hours 7P-7A, please log into the web site [email or web address] and access using universal [place] password for that web site. If you do not have the password, please call the hospital operator.  05/30/2022, 11:50 AM

## 2022-05-30 NOTE — Consult Note (Cosign Needed Addendum)
Face-to-Face Psychiatry Consult   Reason for Consult:  Suicide Attempt  Referring Physician:  Red Christians   Patient Identification: [name] MRN:  [id number] Principal Diagnosis: Metabolic encephalopathy Diagnosis:  Principal Problem:   Metabolic encephalopathy Active Problems:   Chronic anemia   Acute respiratory failure with hypoxia (HCC)   Severe malnutrition (HCC)   Generalized weakness   Drug overdose, intentional, initial encounter ([place])   Chronic pain syndrome   Aspiration pneumonia ([place])   Total Time spent with patient: 20 minutes  Subjective:   71yo with hx Crohn's with persistent abd pain s/p extensive surgeries and is currently TPN dependent. Pt presented with unresponsiveness in apparent suicide attempt via overdose.    During evaluation [name] is seated at the edge of hospital bed. He is alert/oriented x 4; calm/cooperative; and mood congruent with affect.  Patient is speaking in a clear tone at moderate volume, and normal pace; with good eye contact.  His thought process is coherent and relevant; There is no indication that he is currently responding to internal/external stimuli or experiencing delusional thought content.  Patient denies suicidal/self-harm/homicidal ideation, psychosis, and paranoia.  Patient has remained calm throughout assessment and has answered questions appropriately.  He asked appropriate questions regarding his treatment plan, next steps, what to expect and average length of stay.  We did review his current medication regimen to include increase in Zoloft to 75 mg, which he currently denies any side effects.  Specifically we discussed nausea, patient reports having chronic history of nausea however his nausea has not increased since starting and or adjustment of Zoloft.  We also discussed BuSpar versus bupropion, he reports previously taking bupropion for depression.  He is advised that while bupropion may have work for him in the future, it  does contribute to weight loss, which would not be a good indication for him at this time due to recent weight loss, TPN, and history of failure to thrive.  Patient verbalizes understanding, and is advised we will continue BuSpar and Zoloft at this time.  We did review expected course of treatment for Zoloft minimal 4 to 6 weeks. He continues to deny suicidal ideation, homicidal ideation and hallucinations.   Patient was able to utilize his time during this psychiatric reassessment, to vocalize and verbally express his remorse following his actions.  He further reports being accountable, and taking care of himself so that this does not happen again.  We were able to review other protective factors that include disposition and a safety plan.  His sister who was present at the bedside, also participated in his plan and has been updated on his current level of care.  As discussed our current goal is inpatient psychiatric hospitalization, after achieving maintenance goal on Zoloft, stabilization of depression as well as pain; then we can consider a safety plan and a safe disposition for patient.  Psychiatric consult service will continue to monitor at this time.  Updated [name], about plan a versus Plan B.  Son does report when it is time for patient to discharge home, he can come and stay with him in [place].  All questions, comments, and answers were addressed. HPI: Currently on interview, the patient. Alert and oriented x4. Patient reports presenting to the emergency department after a suicide attempt via after taking multiple pills in an attempt to end his life. Over the past couple months he reports worsening depression and anxiety. He reports symptoms of depression including hopelessness, guilt/worthlessness, disturbed sleep  pattern (sleeping 3 to 4 hours during the day) loss of interest, low energy, decreased appetite, isolative, crying spells deterioration of ADLs and recurrent thoughts of  death.  He reports that his symptoms have increased due to multiple stomach surgeries, deconditioning, and increase in pain. Patient is on TPN to improve nutrition, assist with weight gain. He is also on a dysphagia 3 diet at this time, with no assistance required to feed himself. Patient has met his medically goals and now medically stable for transfer to inpatient psychiatric unit.   Past Psychiatric History: Depression  Risk to Self:   yes suicide attempt Risk to Others:   Denies Prior Inpatient Therapy:   Denies Prior Outpatient Therapy:   Denies  Past Medical History:  Past Medical History:  Diagnosis Date   Acute prostatitis 07/24/2007   Qualifier: Diagnosis of  By: [name] MD, [name]    Allergy    mild   Arthritis    osteoarthritis   Asthma    Blood transfusion without reported diagnosis    BPH (benign prostatic hypertrophy) with urinary obstruction    Crohn's ileitis ([place]) suspected 05/03/2017   Dilated aortic root ([place])    noted on echo 08/2012   Diverticulitis of colon    EPIDIDYMITIS 02/15/2010   Qualifier: Diagnosis of  By: [name] MD, [name]    GERD (gastroesophageal reflux disease)    H/O: GI bleed    Hemorrhoids    Hepatitis 1975   unknown type    HERPES SIMPLEX INFECTION 10/14/2007   Qualifier: Diagnosis of  By: [name] MD, [name]    Hiatal hernia    Ileus following gastrointestinal surgery ([place]) 12/26/2011   Long term (current) use of systemic steroids 06/18/2018   Psoriasis    sees [name] at [facility].   Recurrent ventral incisional hernia 05/10/2012   SVT (supraventricular tachycardia)    Ulcer 08/21/2013   ileal    Past Surgical History:  Procedure Laterality Date   BOWEL RESECTION  12/19/2011   Procedure: SMALL BOWEL RESECTION;  Surgeon: [name], MD;  Location: [facility];  Service: General;  Laterality: N/A;  with anastamosis and insertion mesh   BRONCHOSCOPY     COLON SURGERY  01/2004   x 2   COLONOSCOPY W/ BIOPSIES  04/26/2017    per [name], no polyps, benign inflammation, repeat in 5 yrs    CYSTOSCOPY     ESOPHAGOGASTRODUODENOSCOPY     HEMICOLECTOMY     left side, at [facility], diverticulitis   [place]     [phone] incisional hernia   ILEOSTOMY     ILEOSTOMY CLOSURE     INSERTION OF MESH  07/31/2012   Procedure: INSERTION OF MESH;  Surgeon: [name], MD;  Location: [facility];  Service: General;;   LAPAROTOMY  12/19/2011   Procedure: EXPLORATORY LAPAROTOMY;  Surgeon: [name], MD;  Location: [facility];  Service: General;  Laterality: N/A;   PACEMAKER IMPLANT N/A 12/01/2020   Procedure: PACEMAKER IMPLANT;  Surgeon: [name], MD;  Location: [facility];  Service: Cardiovascular;  Laterality: N/A;   PACEMAKER INSERTION Left    TONSILLECTOMY     UPPER GASTROINTESTINAL ENDOSCOPY     VASECTOMY     VENTRAL HERNIA REPAIR  07/31/2012   Procedure: HERNIA REPAIR VENTRAL ADULT;  Surgeon: [name], MD;  Location: [facility];  Service: General;  Laterality: N/A;   Family History:  Family History  Problem Relation Age of Onset   Lung cancer Mother    Leukemia Father    Hypertension Father    Heart disease Father    Heart attack Father    Prostate cancer Father    Prostate cancer Paternal Uncle    Colon cancer Neg Hx    Stomach cancer Neg Hx    Colon polyps Neg Hx    Esophageal cancer Neg Hx    Rectal cancer Neg Hx    Family Psychiatric  History: none reported  Social History:  Social History   Substance and Sexual Activity  Alcohol Use Not Currently   Comment: occ     Social History   Substance and Sexual Activity  Drug Use Yes   Types: Oxycodone    Social History   Socioeconomic History   Marital status: Married    Spouse name: Not on file   Number of children: 2   Years of education: Not on file   Highest education level: Not on file  Occupational History   Occupation: EHS Freight forwarder    Employer: [employer]  Tobacco Use    Smoking status: Never   Smokeless tobacco: Never  Vaping Use   Vaping Use: Never used  Substance and Sexual Activity   Alcohol use: Not Currently    Comment: occ   Drug use: Yes    Types: Oxycodone   Sexual activity: Not on file  Other Topics Concern   Not on file  Social History Narrative   He is married with 2 sons 1 son is an Art gallery manager and 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   He is Nurse, 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   Rare if any caffeine   Rare alcohol and never smoker   Social Determinants of Radio broadcast assistant Strain: Low Risk  (04/10/2022)   Overall Financial Resource Strain (CARDIA)    Difficulty of Paying Living Expenses: Not hard at all  Food Insecurity: No Food Insecurity (05/14/2022)   Hunger Vital Sign    Worried About Running Out of Food in the Last Year: Never true    Ran Out of Food in the Last Year: Never true  Transportation Needs: No Transportation Needs (05/14/2022)   PRAPARE - Hydrologist (Medical): No    Lack of Transportation (Non-Medical): No  Physical Activity: Inactive (04/10/2022)   Exercise Vital Sign    Days of Exercise per Week: 0 days    Minutes of Exercise per Session: 0 min  Stress: Stress Concern Present (04/10/2022)   [place]    Feeling of Stress : To some extent  Social Connections: Not on file   Additional Social History:    Allergies:   Allergies  Allergen Reactions   Purinethol [Mercaptopurine] Other (See Comments)    Pancreatitis    Shellfish Allergy Anaphylaxis and Swelling    Can use standard SMOF lipid formulation for TPN without any issue.    Humira [Adalimumab] Other (See Comments)    Developed antibodies   Tape Rash   Wound Dressing Adhesive Rash    Labs:  Results for orders placed  or performed during the hospital encounter of 05/13/22 (from the past 48 hour(s))  Glucose, capillary     Status: None   Collection Time: 05/28/22  8:32 PM  Result Value Ref Range   Glucose-Capillary 95 70 - 99 mg/dL    Comment: Glucose reference range applies only to samples taken after fasting for at least 8 hours.  Comprehensive metabolic panel     Status: Abnormal   Collection Time: 05/29/22  2:14 AM  Result Value Ref Range   Sodium 136 135 - 145 mmol/L   Potassium 4.1 3.5 - 5.1 mmol/L   Chloride 105 98 - 111 mmol/L   CO2 25 22 - 32 mmol/L   Glucose, Bld 106 (H) 70 - 99 mg/dL    Comment: Glucose reference range applies only to samples taken after fasting for at least 8 hours.   BUN 25 (H) 8 - 23 mg/dL   Creatinine, Ser 1.01 0.61 - 1.24 mg/dL   Calcium 8.2 (L) 8.9 - 10.3 mg/dL   Total Protein 6.4 (L) 6.5 - 8.1 g/dL   Albumin 2.5 (L) 3.5 - 5.0 g/dL   AST 25 15 - 41 U/L   ALT 42 0 - 44 U/L   Alkaline Phosphatase 158 (H) 38 - 126 U/L   Total Bilirubin 0.4 0.3 - 1.2 mg/dL   GFR, Estimated >60 >60 mL/min    Comment: (NOTE) Calculated using the CKD-EPI Creatinine Equation (2021)    Anion gap 6 5 - 15    Comment: Performed at [facility] [place] [address], [place], [place] [address]  Magnesium     Status: None   Collection Time: 05/29/22  2:14 AM  Result Value Ref Range   Magnesium 1.9 1.7 - 2.4 mg/dL    Comment: Performed at [facility], [place] [address], [place], [place] [address]  Phosphorus     Status: None   Collection Time: 05/29/22  2:14 AM  Result Value Ref Range   Phosphorus 3.4 2.5 - 4.6 mg/dL    Comment: Performed at [facility], [place] [address] [place], [place] [address]  Triglycerides     Status: None   Collection Time: 05/29/22  5:04 AM  Result Value Ref Range   Triglycerides 51 <150 mg/dL    Comment: Performed at [facility], [place] [address], [place],  [address]  Glucose,  capillary     Status: Abnormal   Collection Time: 05/29/22  7:33 AM  Result Value Ref Range   Glucose-Capillary 122 (H) 70 - 99 mg/dL    Comment: Glucose reference range applies only to samples taken after fasting for at least 8 hours.   Comment 1 Notify RN    Comment 2 Document in Chart   Glucose, capillary     Status: None   Collection Time: 05/29/22 12:54 PM  Result Value Ref Range   Glucose-Capillary 83 70 - 99 mg/dL    Comment: Glucose reference range applies only to samples taken after fasting for at least 8 hours.   Comment 1 Notify RN    Comment 2 Document in Chart   Glucose, capillary     Status: None   Collection Time: 05/29/22  4:52 PM  Result Value Ref Range   Glucose-Capillary 99 70 - 99 mg/dL    Comment: Glucose reference range applies only to samples taken after fasting for at least 8 hours.  Glucose, capillary     Status: Abnormal   Collection Time: 05/29/22  7:57 PM  Result Value Ref Range   Glucose-Capillary 124 (H) 70 - 99 mg/dL    Comment: Glucose reference range applies only to samples taken after fasting  for at least 8 hours.  CBC with Differential/Platelet     Status: Abnormal   Collection Time: 05/30/22  2:00 AM  Result Value Ref Range   WBC 6.0 4.0 - 10.5 K/uL   RBC 3.42 (L) 4.22 - 5.81 MIL/uL   Hemoglobin 8.1 (L) 13.0 - 17.0 g/dL   HCT 27.4 (L) 39.0 - 52.0 %   MCV 80.1 80.0 - 100.0 fL   MCH 23.7 (L) 26.0 - 34.0 pg   MCHC 29.6 (L) 30.0 - 36.0 g/dL   RDW 18.2 (H) 11.5 - 15.5 %   Platelets 250 150 - 400 K/uL   nRBC 0.0 0.0 - 0.2 %   Neutrophils Relative % 53 %   Neutro Abs 3.3 1.7 - 7.7 K/uL   Lymphocytes Relative 31 %   Lymphs Abs 1.9 0.7 - 4.0 K/uL   Monocytes Relative 10 %   Monocytes Absolute 0.6 0.1 - 1.0 K/uL   Eosinophils Relative 5 %   Eosinophils Absolute 0.3 0.0 - 0.5 K/uL   Basophils Relative 0 %   Basophils Absolute 0.0 0.0 - 0.1 K/uL   Immature Granulocytes 1 %   Abs Immature Granulocytes 0.03 0.00 - 0.07 K/uL    Comment: Performed  at [facility], [place] [address], [place], [place] [address]  Comprehensive metabolic panel     Status: Abnormal   Collection Time: 05/30/22  2:00 AM  Result Value Ref Range   Sodium 138 135 - 145 mmol/L   Potassium 3.9 3.5 - 5.1 mmol/L   Chloride 106 98 - 111 mmol/L   CO2 26 22 - 32 mmol/L   Glucose, Bld 104 (H) 70 - 99 mg/dL    Comment: Glucose reference range applies only to samples taken after fasting for at least 8 hours.   BUN 25 (H) 8 - 23 mg/dL   Creatinine, Ser 0.94 0.61 - 1.24 mg/dL   Calcium 8.5 (L) 8.9 - 10.3 mg/dL   Total Protein 6.1 (L) 6.5 - 8.1 g/dL   Albumin 2.6 (L) 3.5 - 5.0 g/dL   AST 25 15 - 41 U/L   ALT 35 0 - 44 U/L   Alkaline Phosphatase 157 (H) 38 - 126 U/L   Total Bilirubin 0.5 0.3 - 1.2 mg/dL   GFR, Estimated >60 >60 mL/min    Comment: (NOTE) Calculated using the CKD-EPI Creatinine Equation (2021)    Anion gap 6 5 - 15    Comment: Performed at [facility], [place] [address] [place], [place] [address]  Magnesium     Status: None   Collection Time: 05/30/22  2:00 AM  Result Value Ref Range   Magnesium 2.0 1.7 - 2.4 mg/dL    Comment: Performed at [facility], [place] [address] [place], [place] [address]  Phosphorus     Status: None   Collection Time: 05/30/22  2:00 AM  Result Value Ref Range   Phosphorus 3.6 2.5 - 4.6 mg/dL    Comment: Performed at [facility], [place] [address] [place], [place] [address]  Glucose, capillary     Status: Abnormal   Collection Time: 05/30/22  1:32 PM  Result Value Ref Range   Glucose-Capillary 128 (H) 70 - 99 mg/dL    Comment: Glucose reference range applies only to samples taken after fasting for at least 8 hours.    Current Facility-Administered Medications  Medication Dose Route Frequency Provider Last Rate Last Admin   acetaminophen (TYLENOL) suppository 325 mg  325 mg Rectal Q4H PRN Cherene Altes, MD   325 mg at 05/23/22 1124   busPIRone  (BUSPAR) tablet 5 mg  5 mg Oral BID Eugenie Filler, MD   5 mg at 05/30/22 1049   Chlorhexidine Gluconate Cloth 2 % PADS 6 each  6 each Topical Daily Cherene Altes, MD   6 each at 05/30/22 0951   diphenhydrAMINE (BENADRYL) injection 12.5 mg  12.5 mg Intravenous QHS PRN Donne Hazel, MD   12.5 mg at 05/21/22 0046   enoxaparin (LOVENOX) injection 40 mg  40 mg Subcutaneous Q24H Joette Catching T, MD   40 mg at 05/29/22 2308   HYDROmorphone (DILAUDID) injection 0.5 mg  0.5 mg Intravenous Q6H PRN Loistine Chance, MD   0.5 mg at 05/30/22 1100   insulin aspart (novoLOG) injection 0-9 Units  0-9 Units Subcutaneous 2 times per day Polly Cobia, [place]   1 Units at 05/29/22 2100   lidocaine (LIDODERM) 5 % 1 patch  1 patch Transdermal Q24H Cristal Generous, NP   1 patch at 05/30/22 1629   morphine (MSIR) tablet 15 mg  15 mg Oral Q6H PRN Eugenie Filler, MD   15 mg at 05/30/22 1629   Oral care mouth rinse  15 mL Mouth Rinse PRN Candee Furbish, MD       oxybutynin (DITROPAN) tablet 5 mg  5 mg Oral TID Eugenie Filler, MD   5 mg at 05/30/22 1603   pantoprazole (PROTONIX) EC tablet 40 mg  40 mg Oral Daily Eugenie Filler, MD   40 mg at 05/30/22 0810   prochlorperazine (COMPAZINE) injection 10 mg  10 mg Intravenous Q6H PRN Eugenie Filler, MD   10 mg at 05/29/22 2354   prochlorperazine (COMPAZINE) tablet 10 mg  10 mg Oral Q6H PRN Eugenie Filler, MD   10 mg at 05/30/22 0809   psyllium (HYDROCIL/METAMUCIL) 1 packet  1 packet Oral Daily Eugenie Filler, MD   1 packet at 05/28/22 2831   sertraline (ZOLOFT) tablet 75 mg  75 mg Oral Daily Suella Broad, FNP   75 mg at 05/30/22 1048   simethicone (MYLICON) chewable tablet 80 mg  80 mg Oral TID WC & HS Eugenie Filler, MD   80 mg at 05/30/22 1603   sodium chloride flush (NS) 0.9 % injection 10-40 mL  10-40 mL Intracatheter PRN Cherene Altes, MD   20 mL at 05/23/22 1733   sulfamethoxazole-trimethoprim (BACTRIM DS) 800-160 MG per  tablet 1 tablet  1 tablet Oral Q12H Eugenie Filler, MD   1 tablet at 05/30/22 1049   tamsulosin (FLOMAX) capsule 0.4 mg  0.4 mg Oral QPC supper Eugenie Filler, MD   0.4 mg at 51/76/16 0737   TPN CYCLIC-ADULT (ION)   Intravenous Cyclic-See Admin Instructions Lynelle Doctor, RPH        Musculoskeletal: Strength & Muscle Tone: Laying in bed   Gait & Station: Laying in bed   Patient leans: Laying in bed      Psychiatric Specialty Exam: Psychiatric Specialty Exam: Physical Exam Vitals and nursing note reviewed.  Constitutional:      Appearance: Normal appearance. He is normal weight.  Skin:    General: Skin is warm.     Capillary Refill: Capillary refill takes less than 2 seconds.  Neurological:     General: No focal deficit present.     Mental Status: He is alert and oriented to person,  place, and time. Mental status is at baseline.  Psychiatric:        Attention and Perception: Attention and perception normal.        Mood and Affect: Affect normal. Mood is anxious and depressed.        Speech: Speech normal.        Behavior: Behavior normal. Behavior is cooperative.        Thought Content: Thought content normal.        Cognition and Memory: Cognition normal.        Judgment: Judgment normal.     Review of Systems  Psychiatric/Behavioral:  Positive for depression and suicidal ideas (denies). Negative for hallucinations and substance abuse. The patient is nervous/anxious. The patient does not have insomnia.   All other systems reviewed and are negative.   Blood pressure (!) 107/55, pulse 92, temperature 98.6 F (37 C), temperature source Oral, resp. rate 18, height _0  (1.778 m), weight 68.1 kg, SpO2 97 %.Body mass index is 21.54 kg/m.  General Appearance: Fairly Groomed  Eye Contact:  Good  Speech:  Clear and Coherent and Normal Rate  Volume:  Normal  Mood:  Anxious  Affect:  Appropriate and Congruent  Thought Process:  Coherent, Goal Directed, Linear, and  Descriptions of Associations: Intact  Orientation:  Full (Time, Place, and Person)  Thought Content:  Logical  Suicidal Thoughts:  No  Homicidal Thoughts:  No  Memory:  Immediate;   Fair Recent;   Fair  Judgement:  Fair  Insight:  Fair  Psychomotor Activity:  Normal  Concentration:  Concentration: Fair and Attention Span: Fair  Recall:  [employer] of Knowledge:  Good  Language:  Good  Akathisia:  No  Handed:  Right  AIMS (if indicated):     Assets:  Communication Skills Desire for Improvement Financial Resources/Insurance Leisure Time Resilience Social Support  ADL's:  Intact  Cognition:  WNL  Sleep:         Physical Exam: Physical Exam Vitals and nursing note reviewed.  Constitutional:      Appearance: Normal appearance. He is normal weight.  Skin:    General: Skin is warm.     Capillary Refill: Capillary refill takes less than 2 seconds.  Neurological:     General: No focal deficit present.     Mental Status: He is alert and oriented to person, place, and time. Mental status is at baseline.  Psychiatric:        Attention and Perception: Attention and perception normal.        Mood and Affect: Affect normal. Mood is anxious and depressed.        Speech: Speech normal.        Behavior: Behavior normal. Behavior is cooperative.        Thought Content: Thought content normal.        Cognition and Memory: Cognition normal.        Judgment: Judgment normal.    Review of Systems  Psychiatric/Behavioral:  Positive for depression and suicidal ideas (denies). Negative for hallucinations and substance abuse. The patient is nervous/anxious. The patient does not have insomnia.   All other systems reviewed and are negative.  pt asleep unable to assess  Blood pressure (!) 107/55, pulse 92, temperature 98.6 F (37 C), temperature source Oral, resp. rate 18, height _1  (1.778 m), weight 68.1 kg, SpO2 97 %. Body mass index is 21.54 kg/m.  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  Recommend referral to [facility], as patient has been difficult to place. -Continue sertraline 75 mg p.o. daily.   -Pt can have access to his cell phone when family is present.   Disposition: Recommend psychiatric Inpatient admission when medically cleared.  Suella Broad, FNP 05/30/2022 4:56 PM

## 2022-05-31 DIAGNOSIS — G9341 Metabolic encephalopathy: Secondary | ICD-10-CM | POA: Diagnosis not present

## 2022-05-31 LAB — GLUCOSE, CAPILLARY
Glucose-Capillary: 124 mg/dL — ABNORMAL HIGH (ref 70–99)
Glucose-Capillary: 158 mg/dL — ABNORMAL HIGH (ref 70–99)

## 2022-05-31 LAB — SARS CORONAVIRUS 2 BY RT PCR: SARS Coronavirus 2 by RT PCR: NEGATIVE

## 2022-05-31 MED ORDER — SODIUM CHLORIDE 0.9 % IV SOLN
250.0000 mg | Freq: Once | INTRAVENOUS | Status: AC
  Administered 2022-05-31: 250 mg via INTRAVENOUS
  Filled 2022-05-31: qty 20

## 2022-05-31 MED ORDER — TRAVASOL 10 % IV SOLN
INTRAVENOUS | Status: AC
  Filled 2022-05-31: qty 1312.5

## 2022-05-31 MED ORDER — CYANOCOBALAMIN 1000 MCG/ML IJ SOLN
1000.0000 ug | Freq: Once | INTRAMUSCULAR | Status: AC
  Administered 2022-05-31: 1000 ug via SUBCUTANEOUS
  Filled 2022-05-31: qty 1

## 2022-05-31 NOTE — Progress Notes (Signed)
PHARMACY - TOTAL PARENTERAL NUTRITION CONSULT NOTE   Indication: Crohn's disease and complex past surgical history including ex lap with small bowel resection, lysis of adhesion, mesh explantation, and diverting loop ileostomy in April 2023, on TPN prior to admission  Patient Measurements: Height: _0  (177.8 cm) Weight: 68.1 kg (150 lb 1.6 oz) IBW/kg (Calculated) : 73 TPN AdjBW (KG): 68.1 Body mass index is 21.54 kg/m.  Assessment: Patient with PMH of Crohn's disease and complex surgical history as above on chronic TPN who presented on 05/13/22 with altered mental status after spouse found patient unresponsive with multiple pill bottles open around him. Patient was transported from SNF to ED the day prior for generalized weakness and acute worsening of his chronic abdominal pain. Patient decided to return home instead of his SNF. Pharmacy consulted to resume TPN as inpatient.   Discharge from [place] 04/19/22:  TPN pharmacist at [place] [facility]:  Estimated Nutrient Requirements: Protein: 1.5 - 1.7 g/kg/day (98 - 104 g/day), Total kcal: 30 - 35 kcal/kg/day (1830 - 1983 kcal/day)  TPN Formulation at [facility] per TPN pharmacist: [A 6.6%, D 20%, F 3.1%] @ goal rate, 65 mL/hr (TV: 1560 mL/day) provides: Protein 1.7 g/kg/day 103 g/day, Total kcal 32 kcal/kg/day 1971 kcal/day  NPC: N ratio 94   Admitted at [facility] 04/29/22-05/03/22: Nutritional Goals: TPN 80 mL/hr (provides 113 g of protein and 1943 kcals per day) RD Assessment: (10/16): 2100-2450kcal (30-35kcal/kg), Protein 105-130g (1.5-1.8g/kg) Utilize intralipid rather than SMOF as 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  [facility]: [name], PharmD, ph: [phone] 10/28 Most recent formula: Total volume 1560 mL:  Dextrose 220 g/day, Plenamine 120 g/day, SMOFlipid 50 g/day, NaCl 75 mEq, CaGluc 6 mEq, Potassium Ac 20 mEq, Potassium Phos 13.5 nM, Mag Sulfate 8 mEq, Vitamins/trace  elements  Glucose / Insulin: No hx DM.  - on sSSI (none required in the past 24 hrs) - cbgs (goal <150): wnl Electrolytes: lytes wnl Renal: SCr ok; BUN slightly elevated at 25; UOP charting questionable Hepatic:  - AST/ALT bumped on 11/6, but now down to wnl - Alk phos elevated  - Albumin low 2.5 - TG 51 (11/13) I/O: - 305 mL questionable - ileostomy output 275 ml/day - osmolality 298 slightly elevated - MIVF: dc'd 11/11 GI Imaging: - 11/6 abd [place] - normal liver (checked d/t elevated LFTs) - 10/27 CTa/p: no significant interval change since 10/14 CT. No evidence of bowel obstruction or other complicating feature at the anastomotic site or ostomy. - 10/14 CTa/p: previous surgical changes noted (transverse-sigmoid anastomosis) with stable inflammation since 10/12 CT GI Surgeries / Procedures: n/a  Central access: prior to admission - double lumen CVC TPN start date: started PTA  Nutritional Goals: TPN provides 131 g of protein and 2010 kcals per day.  RD Assessment this admission:    Estimated Needs Total Energy Estimated Needs: 2015-2350kcal Total Protein Estimated Needs: 100-135g Total Fluid Estimated Needs: 2430m (Holliday Segar)  Current Nutrition:  TPN Dysphagia-3 144/0>>   Plan:  Cyclic TPN with 23474ml over 18 hr cycle GIR 20.9 - 4.17 Electrolytes in TPN:  Na 75 mEq/L K 45 mEq/L Ca 5 mEq/L Mg 4 mEq/L Phos 15 mmol/L Cl:Ac = 1:1 Add standard MVI and trace elements to TPN continue pepcid in TPN  With excellent CBGs, will limit finger sticks to twice daily - 2 hrs after TPN start, 1 hour after TPN stop IVF dc'd by MD Monitor TPN  labs on Mon/Thurs at minimum  Peggyann Juba, PharmD, BCPS Pharmacy: [phone] 05/31/2022 6:53 AM

## 2022-05-31 NOTE — Progress Notes (Signed)
Physical Therapy Treatment Patient Details Name: [name] MRN: [id number] DOB: 02-Jul-1951 Today's Date: 05/31/2022   History of Present Illness 71 year old male with complicated past medical history as outlined below, which is significant for Crohn's disease with persistent abdominal pain.  On April of this year he underwent exploratory laparotomy with small bowel resection, repair of enterotomy, and creation of a diverting loop ileostomy at [facility].  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  He has been residing in [facility] SNF from where he presented to the emergency department on 10/27 for pain at the site of his ostomy with foul-smelling discharge.  Work-up of the ostomy and abdomen including CT imaging was unremarkable.  He was discharged to SNF, however, rather than returning to SNF he found private transport to his home.Then on 10/28 he again presented to [facility] emergency department with chief complaint of unresponsiveness.11/2 reveals that ingestion of pills at home was in fact a suicide attempt. 1:1 sitter, psych consult    PT Comments    Pt agreeable to PT however requiring more assist overall  this session. [name] c/o severe L quad "cramps" limiting him from mobilizing. Pt is in a dark room, 1:1 sitter and wife present. RN states family closes blinds and keeps room dark. Encouraged pt to perfom a few simple exercises on his own as well as to be OOB each day, including days that PT does not see him.  Continue efforts to progress as able   Recommendations for follow up therapy are one component of a multi-disciplinary discharge planning process, led by the attending physician.  Recommendations may be updated based on patient status, additional functional criteria and insurance authorization.  Follow Up Recommendations  Skilled nursing-short term rehab (<3 hours/day) Can patient physically be transported by private vehicle: No   Assistance Recommended at Discharge  Frequent or constant Supervision/Assistance  Patient can return home with the following A lot of help with walking and/or transfers;A lot of help with bathing/dressing/bathroom;Assist for transportation;Help with stairs or ramp for entrance;Direct supervision/assist for medications management;Assistance with cooking/housework   Equipment Recommendations  None recommended by PT    Recommendations for Other Services       Precautions / Restrictions Precautions Precautions: Fall Precaution Comments: ostomy     Mobility  Bed Mobility Overal bed mobility: Needs Assistance Bed Mobility: Supine to Sit     Supine to sit: Mod assist     General bed mobility comments: assist for trunk upright and LE's over EOB; patient was able to scoot himself to EOB with incr time    Transfers Overall transfer level: Needs assistance Equipment used: Rolling walker (2 wheels) Transfers: Sit to/from Stand Sit to Stand: Min assist, +2 safety/equipment           General transfer comment: step by step multi-modal cues for wt shift, hand placement, foot position. incr time and assist to rise and transition to RW    Ambulation/Gait Ambulation/Gait assistance: +2 safety/equipment, Min assist, Mod assist Gait Distance (Feet): 6 Feet (3' initial attempt) Assistive device: Rolling walker (2 wheels) Gait Pattern/deviations: Step-through pattern, Decreased stride length, Knee flexed in stance - right, Knee flexed in stance - left, Decreased dorsiflexion - right, Decreased dorsiflexion - left, Trunk flexed       General Gait Details: min assist +2 needed for recliner follow. pt took a few steps on intial attempt and then began to sit unexpectedly d/t "leg cramp" requiring +2 to lower to chair and prevent fall.  assit  for balance and multi-modal cues for posture, incr step length and RW position. very unsteady gait   Stairs             Wheelchair Mobility    Modified Rankin (Stroke Patients  Only)       Balance     Sitting balance-Leahy Scale: Fair     Standing balance support: Bilateral upper extremity supported, Reliant on assistive device for balance Standing balance-Leahy Scale: Poor                              Cognition Arousal/Alertness: Awake/alert Behavior During Therapy: Flat affect   Area of Impairment: Following commands, Safety/judgement                 Orientation Level: Disoriented to, Time Current Attention Level: Focused   Following Commands: Follows one step commands with increased time Safety/Judgement: Decreased awareness of safety, Decreased awareness of deficits     General Comments: thinks it is Friday, oriented to month, place, self; pt with sitter and wife present in dark room; family keeps room dark per RN        Exercises General Exercises - Lower Extremity Ankle Circles/Pumps: AROM, Both, 10 reps Heel Slides: AROM, Both, 10 reps Hip ABduction/ADduction: AROM, AAROM, Both, 10 reps    General Comments        Pertinent Vitals/Pain Pain Assessment Pain Assessment: Faces Faces Pain Scale: Hurts whole lot Pain Location: L thigh Pain Descriptors / Indicators: Contraction, Cramping Pain Intervention(s): Limited activity within patient's tolerance, Monitored during session, Repositioned    Home Living                          Prior Function            PT Goals (current goals can now be found in the care plan section) Acute Rehab PT Goals Patient Stated Goal: to get better PT Goal Formulation: With patient/family Time For Goal Achievement: 06/01/22 Potential to Achieve Goals: Fair Progress towards PT goals: Not progressing toward goals - comment (pain, fatigue)    Frequency    Min 2X/week      PT Plan Current plan remains appropriate    Co-evaluation              AM-PAC PT "6 Clicks" Mobility   Outcome Measure  Help needed turning from your back to your side while in a flat  bed without using bedrails?: A Lot Help needed moving from lying on your back to sitting on the side of a flat bed without using bedrails?: A Lot Help needed moving to and from a bed to a chair (including a wheelchair)?: A Lot Help needed standing up from a chair using your arms (e.g., wheelchair or bedside chair)?: A Lot Help needed to walk in hospital room?: Total Help needed climbing 3-5 steps with a railing? : Total 6 Click Score: 10    End of Session Equipment Utilized During Treatment: Gait belt Activity Tolerance: Patient limited by fatigue;Patient limited by pain Patient left: with call bell/phone within reach;in chair;with chair alarm set Nurse Communication: Mobility status PT Visit Diagnosis: Difficulty in walking, not elsewhere classified (R26.2)     Time: 7425-9563 PT Time Calculation (min) (ACUTE ONLY): 21 min  Charges:  $Gait Training: 8-22 mins  Baxter Flattery, PT  Acute Rehab Dept [facility]) [phone]  [facility] Weekend Pager [facility] only)  [phone]  05/31/2022    [facility] 05/31/2022, 3:14 PM

## 2022-05-31 NOTE — Consult Note (Addendum)
[facility] [place] Nurse ostomy follow up Refer to previous [place] consult note on 11/8.  Bedside nurses requested assistance with ostomy care.  Pouch was leaking this morning and a new one was recently applied.  It is intact with god seal, large amt liquid brown stool.  Pt requested current pouch be left in place since it is not leaking.  I have ordered 5 sets of barrier rings and one piece convex pouches to be placed in the room and will provide instructions for staff nurses to use to attempt to troubleshoot and avoid leaking. Stoma type/location: Stoma is red and viable when visualized through the pouch.  Pt states he previously was cutting the opening to just inside 1 inch.  He states he was independent prior to admission, but now requires total assistance with pouch application and emptying. Discussed pouching steps with patient and family member at the bedside and they are in agreement with the plan of care.   Instructions provided for bedside nurses to perform as follows: Empty pouch when 1/3  to  full of stool/flatus Clean bottom 2-inches of fecal pouch prior to resealing Assist patient in emptying pouch Change pouch twice weekly and PRN.  Write date of pouch application on pouch. Have spare pouch at bedside at all times  Apply as directed below: (all products are at the bedside) 1. Use adhesive remover wipes to assist with removing previous pouch.  More can be found in the supply room. 2. Cleanse skin with moist washcloth, then apply stoma powder, Kellie Simmering # 6) brush away extra powder, then blot with skin prep wipes 3. Apply barrier ring, stretch around stoma to fit the edges Kellie Simmering # [phone]) 4. Cut opening to just inside 1 inch, then apply one piece convex pouch Kellie Simmering # [phone]) in an up and down fashion, with spout pointing down towards leg 5. Apply belt Kellie Simmering # [phone]) into belt loops on the pouch  and tuck to a smaller size and tape this in place so it is not too loose 6. Empty every hour to avoid  overfilling which contributes towards leakage.  Please re-consult if further assistance is needed.  Thank-you,  Julien Girt MSN, [place], [place], [place], [place]

## 2022-05-31 NOTE — Progress Notes (Signed)
[facility] Progress Note Patient: [name] MWN:[id number] DOB: 1951/05/28 DOA: 05/13/2022  DOS: the patient was seen and examined on 05/31/2022  Brief hospital course: 71yo with hx Crohn's with persistent abd pain s/p extensive surgeries and is currently TPN dependent. Pt presented with unresponsiveness in apparent suicide attempt via overdose. Pt remained unresponsive and later was unable to maintain airway, prompting intubation and transfer to ICU. Pt now extubated and care transferred to hospitalist. Assessment and Plan: Intentional drug overdose. Suicide ideation. MDD. Patient presented with an unresponsive event at home. Noted to have intentionally taken pain medication and anxiolytics with intention for suicide. Psychiatry was consulted. Recommended inpatient psychiatric admission. Patient currently medically stable although unable to find a place that can accommodate TPN. Referred to [place]  Acute toxic and metabolic encephalopathy. Likely combination of medication overdose as well as hypoxia from pneumonia. Head CT and MRI brain negative for any acute abnormality. Mentation improving. Monitor.  Aspiration pneumonia. Dysphagia. Completed IV antibiotic therapy.  Dysphagia. Speech therapy following the patient. MBS was performed and patient is currently on dysphagia 3 diet. Tolerating it well. Monitor.  Crohn's disease. Patient had multiple surgeries in the past.currently not on any medical therapy. 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 Management per pharmacy.  Severe protein calorie malnutrition. Body mass index is 21.54 kg/m.  Nutrition Problem: Severe Malnutrition Etiology: chronic illness Interventions: TPN (increase nutrients) Continue the same.  Chronic prostatitis. Bladder calculi. BPH. Seen by [name] on 10/15. Patient is on Bactrim for 28 days since then although missed some doses while he was  intubated. Currently based on my discussion with urology on 11/15 we will continue total 14-day treatment course for Bactrim. Patient had a Foley catheter placed in the ICU most likely due to critical condition. Patient is on Flomax since last 7 days. For now I will attempt voiding trial with Foley catheter removal early in the morning of 11/16. Discontinue oxybutynin as that can cause retention and increased PVR. May consider starting him on finasteride as well. Patient will follow-up with [name] for outpatient procedure for BPH.  Ileostomy leakage with irritation and dermatitis.. Wound care consulted. Currently being treated with stoma powder and frequent emptying.  COVID exposure. Patient's wife and son both got tested positive on 11/15 for COVID-19. Family has been visiting the patient throughout the hospital stay. We will check the patient for COVID-19 infection. For now given the patient does not have any symptoms I would not continue any isolation. We will perform a recheck of COVID-19 test on 11/19.  Chronic pain syndrome. Patient actually is on chronic narcotics. Presented with encephalopathy from overdose. Currently back on home regimen. Monitor.  Transient A-fib. Resolved. Monitor.  Normocytic anemia.  Iron deficiency.  Relative vitamin B12 deficiency Baseline hemoglobin around 10. Currently hemoglobin around 8.  Iron level 18.  Will provide iron supplementation. Suspect this is from frequent blood draws and poor p.o. intake and nutritional status. Vitamin B12 301.  We will replace percutaneously.   Subjective: Patient reports irritation of the ileostomy site.  No other acute complaint.  Reports fatigue and tiredness.  No nausea no vomiting.  No chest pain.  Physical Exam: General: in moderate distress;  Cardiovascular: S1 and S2 Present, no Murmur Respiratory: normal respiratory effort, Bilateral Air entry present, no Crackles, no wheezes Abdomen: Bowel  Sound present, Non tender  Extremities: no edema Neurology: alert and oriented to time, place, and person no asterixis.  Data Reviewed: I have Reviewed nursing  notes, Vitals, and Lab results. Since last encounter, pertinent lab results CBC and BMP, iron level, B12 level   . I have ordered test including CBC, BMP  . I have discussed pt's care plan and test results with urology  .   Disposition: Status is: Inpatient Remains inpatient appropriate because: Unsafe discharge plan.  Need SNF and TPN with inpatient psychiatric admission.  enoxaparin (LOVENOX) injection 40 mg Start: 05/13/22 2200   Family Communication: Wife at bedside Level of care: Med-Surg  Vitals:   05/30/22 1334 05/30/22 2018 05/31/22 0705 05/31/22 1404  BP: (!) 107/55 127/61 (!) 124/55 (!) 127/56  Pulse: 92 75 83 80  Resp: 18 16 16 18   Temp: 98.6 F (37 C) (!) 97.5 F (36.4 C) 97.6 F (36.4 C) 97.8 F (36.6 C)  TempSrc: Oral Oral Oral Oral  SpO2: 97% 99% 97% 98%  Weight:      Height:         Author: Berle Mull, MD 05/31/2022 3:58 PM  Please look on [email or web address] to find out who is on call.

## 2022-05-31 NOTE — Plan of Care (Signed)
  Problem: Education: Goal: Knowledge of General Education information will improve Description: Including pain rating scale, medication(s)/side effects and non-pharmacologic comfort measures Outcome: Progressing   Problem: Pain Managment: Goal: General experience of comfort will improve Outcome: Progressing   Problem: Safety: Goal: Ability to remain free from injury will improve Outcome: Progressing   

## 2022-06-01 DIAGNOSIS — G9341 Metabolic encephalopathy: Secondary | ICD-10-CM | POA: Diagnosis not present

## 2022-06-01 DIAGNOSIS — R401 Stupor: Secondary | ICD-10-CM | POA: Diagnosis not present

## 2022-06-01 LAB — COMPREHENSIVE METABOLIC PANEL
ALT: 30 U/L (ref 0–44)
AST: 26 U/L (ref 15–41)
Albumin: 2.5 g/dL — ABNORMAL LOW (ref 3.5–5.0)
Alkaline Phosphatase: 163 U/L — ABNORMAL HIGH (ref 38–126)
Anion gap: 4 — ABNORMAL LOW (ref 5–15)
BUN: 26 mg/dL — ABNORMAL HIGH (ref 8–23)
CO2: 26 mmol/L (ref 22–32)
Calcium: 8.2 mg/dL — ABNORMAL LOW (ref 8.9–10.3)
Chloride: 107 mmol/L (ref 98–111)
Creatinine, Ser: 0.96 mg/dL (ref 0.61–1.24)
GFR, Estimated: >60 mL/min (ref >60)
Glucose, Bld: 115 mg/dL — ABNORMAL HIGH (ref 70–99)
Potassium: 3.7 mmol/L (ref 3.5–5.1)
Sodium: 137 mmol/L (ref 135–145)
Total Bilirubin: 0.6 mg/dL (ref 0.3–1.2)
Total Protein: 6.3 g/dL — ABNORMAL LOW (ref 6.5–8.1)

## 2022-06-01 LAB — TRIGLYCERIDES: Triglycerides: 47 mg/dL (ref <150)

## 2022-06-01 LAB — GLUCOSE, CAPILLARY
Glucose-Capillary: 88 mg/dL (ref 70–99)
Glucose-Capillary: 104 mg/dL — ABNORMAL HIGH (ref 70–99)

## 2022-06-01 LAB — PHOSPHORUS: Phosphorus: 3.2 mg/dL (ref 2.5–4.6)

## 2022-06-01 LAB — MAGNESIUM: Magnesium: 1.9 mg/dL (ref 1.7–2.4)

## 2022-06-01 MED ORDER — SERTRALINE HCL 100 MG PO TABS
100.0000 mg | ORAL_TABLET | Freq: Every day | ORAL | Status: DC
  Administered 2022-06-02 – 2022-06-13 (×12): 100 mg via ORAL
  Filled 2022-06-01 (×12): qty 1

## 2022-06-01 MED ORDER — POTASSIUM CHLORIDE 10 MEQ/100ML IV SOLN
10.0000 meq | INTRAVENOUS | Status: AC
  Administered 2022-06-01 (×4): 10 meq via INTRAVENOUS
  Filled 2022-06-01 (×4): qty 100

## 2022-06-01 MED ORDER — ONDANSETRON HCL 4 MG/2ML IJ SOLN
4.0000 mg | Freq: Four times a day (QID) | INTRAMUSCULAR | Status: DC | PRN
  Administered 2022-06-01 – 2022-06-14 (×22): 4 mg via INTRAVENOUS
  Filled 2022-06-01 (×26): qty 2

## 2022-06-01 MED ORDER — FINASTERIDE 5 MG PO TABS
5.0000 mg | ORAL_TABLET | Freq: Every day | ORAL | Status: DC
  Administered 2022-06-01 – 2022-06-23 (×23): 5 mg via ORAL
  Filled 2022-06-01 (×23): qty 1

## 2022-06-01 MED ORDER — INSULIN ASPART 100 UNIT/ML IJ SOLN: 0.0000 [IU] | INTRAMUSCULAR | Status: AC

## 2022-06-01 MED ORDER — POTASSIUM CHLORIDE CRYS ER 20 MEQ PO TBCR
40.0000 meq | EXTENDED_RELEASE_TABLET | Freq: Once | ORAL | Status: DC
  Filled 2022-06-01: qty 2

## 2022-06-01 MED ORDER — TRAVASOL 10 % IV SOLN
INTRAVENOUS | Status: AC
  Filled 2022-06-01: qty 1312.5

## 2022-06-01 NOTE — Progress Notes (Signed)
[facility] Progress Note Patient: [name] HQP:[id number] DOB: 1951/06/06 DOA: 05/13/2022  DOS: the patient was seen and examined on 06/01/2022  Brief hospital course: 71yo with hx Crohn's with persistent abd pain s/p extensive surgeries and is currently TPN dependent. Pt presented with unresponsiveness in apparent suicide attempt via overdose. Pt remained unresponsive and later was unable to maintain airway, prompting intubation and transfer to ICU. Pt now extubated and care transferred to hospitalist. Assessment and Plan: Intentional drug overdose. Suicide ideation. MDD. Patient presented with an unresponsive event at home. Noted to have intentionally taken pain medication and anxiolytics with intention for suicide. Psychiatry was consulted. Recommended inpatient psychiatric admission. Patient currently medically stable although unable to find a place that can accommodate TPN. Referred to [place]   Acute toxic and metabolic encephalopathy. Likely combination of medication overdose as well as hypoxia from pneumonia. Head CT and MRI brain negative for any acute abnormality. Mentation improving. Monitor.   Aspiration pneumonia. Dysphagia. Completed IV antibiotic therapy.   Dysphagia. Speech therapy following the patient. MBS was performed and patient is currently on dysphagia 3 diet. Tolerating it well. Monitor.   Crohn's disease. Patient had multiple surgeries in the past.currently not on any medical therapy. 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 Management per pharmacy.   Severe protein calorie malnutrition. Body mass index is 21.54 kg/m.  Nutrition Problem: Severe Malnutrition Etiology: chronic illness Interventions: TPN (increase nutrients) Continue the same.   Chronic prostatitis. Bladder calculi. BPH. Seen by [name] on 10/15. Patient is on Bactrim for 28 days since then although missed some doses while he was  intubated. Currently based on my discussion with urology on 11/15 we will continue total 14-day treatment course for Bactrim. Patient had a Foley catheter placed in the ICU most likely due to critical condition. Patient is on Flomax also. Foley removed on 11/16.  Monitor bladder scan. We will replace Foley catheter if retaining more than 300 mL. Discontinue oxybutynin as that can cause retention and increased PVR. Start on finasteride as well. Patient will follow-up with [name] for outpatient procedure for BPH.   Ileostomy leakage with irritation and dermatitis.. Wound care consulted. Currently being treated with stoma powder and frequent emptying.   COVID exposure. Patient's wife and son both got tested positive on 11/15 for COVID-19. Family has been visiting the patient throughout the hospital stay. COVID-19 negative.  Currently no indication for isolation. We will perform a recheck of COVID-19 test on 11/19.   Chronic pain syndrome. Patient actually is on chronic narcotics. Presented with encephalopathy from overdose. Currently back on home regimen. Monitor.   Transient A-fib. Resolved. Monitor.   Normocytic anemia.  Iron deficiency.  Relative vitamin B12 deficiency Baseline hemoglobin around 10. Currently hemoglobin around 8.  Iron level 18.  Will provide iron supplementation. Suspect this is from frequent blood draws and poor p.o. intake and nutritional status. Vitamin B12 301.  We will replace percutaneously.   Subjective: No nausea or vomiting.  Foley catheter removed on 11/16.  Physical Exam: General: in mild distress;  Cardiovascular: S1 and S2 Present, no Murmur Respiratory: normal respiratory effort, Bilateral Air entry present, no Crackles, no wheezes Abdomen: Bowel Sound present, mild periileostomy tenderness. Extremities: no edema Neurology: alert and oriented to time, place, and person   Data Reviewed: I have Reviewed nursing notes, Vitals, and Lab  results. Since last encounter, pertinent lab results CBC and BMP   . I have ordered test including CBC and BMP  .  I have discussed pt's care plan and test results with psychiatric  .   Disposition: Status is: Inpatient Remains inpatient appropriate because: Continue one-to-one sitter, unsafe discharge.  Needs [facility].  enoxaparin (LOVENOX) injection 40 mg Start: 05/13/22 2200   Family Communication: No one at bedside. Level of care: Med-Surg  Vitals:   05/31/22 1404 05/31/22 2004 06/01/22 0540 06/01/22 1316  BP: (!) 127/56 (!) 128/59 107/61 (!) 103/53  Pulse: 80 (!) 104 77 71  Resp: 18 17 17 17   Temp: 97.8 F (36.6 C) 100.2 F (37.9 C) 98 F (36.7 C) 98.3 F (36.8 C)  TempSrc: Oral Oral Oral Oral  SpO2: 98% 96% 97% 96%  Weight:      Height:         Author: Berle Mull, MD 06/01/2022 5:28 PM  Please look on [email or web address] to find out who is on call.

## 2022-06-01 NOTE — Consult Note (Signed)
Face-to-Face Psychiatry Consult   Reason for Consult:  Suicide Attempt  Referring Physician:  Red Christians   Patient Identification: [name] MRN:  [id number] Principal Diagnosis: Metabolic encephalopathy Diagnosis:  Principal Problem:   Metabolic encephalopathy Active Problems:   Chronic anemia   Acute respiratory failure with hypoxia (HCC)   Severe malnutrition (HCC)   Generalized weakness   Drug overdose, intentional, initial encounter ([place])   Chronic pain syndrome   Aspiration pneumonia ([place])   Total Time spent with patient: 20 minutes  Subjective:   71yo with hx Crohn's with persistent abd pain s/p extensive surgeries and is currently TPN dependent. Pt presented with unresponsiveness in apparent suicide attempt via overdose.     Patient seen and reassessed, chart reviewed and case discussed with primary team.  Patient appears to be tolerating Zoloft 75 mg p.o. daily will at this time.  He remains compliant with medication at this time.  He reports improvement in his depressive symptoms, and continues to deny suicidal ideations.  He is able to answer age appropriate questions, provide understanding, and appreciation for services being received up until this point.  Did discuss with patient possibility of discharging home with family members, receiving outpatient services, and then returning home.  Patient was in agreement with this decision.  As noted above he continues to deny suicidal ideation.  He further denies homicidal ideation, psychosis, and thoughts to harm self.   Made an attempt to update wife regarding discharge planning.  Patient's wife currently does not agree with referral to [facility]).  She also was not agreements with patient discharging to family members home, and has declined him transferring to his sister's home in his son's home.  Did make attempts to explain to wife safety planning importance of safety planning, stabilization  from a psychiatric standpoint, and smooth transition back home after he has been stabilized with appropriate resources in place.  At which point she became labile, irate, began threatening legal measures.  She did request to have psychiatrist to contact her on the weekend as she did not wish to speak with me any further.  Writer attempted to answer any questions she may have, call was terminated after mutual agreement due to patient being upset and becoming irate and yelling at this provider.  Please see below for ongoing recommendations.  Will continue with current recommendations for inpatient psychiatric admission.  Would like to proceed with safety planning and discharge planning to a safe facility.  Historically referrals to state hospital have been weeks to months, and would rather a for patient the opportunity to pursue outpatient psychiatric services in a safe environment.  HPI: Currently on interview, the patient. Alert and oriented x4. Patient reports presenting to the emergency department after a suicide attempt via after taking multiple pills in an attempt to end his life. Over the past couple months he reports worsening depression and anxiety. He reports symptoms of depression including hopelessness, guilt/worthlessness, disturbed sleep pattern (sleeping 3 to 4 hours during the day) loss of interest, low energy, decreased appetite, isolative, crying spells deterioration of ADLs and recurrent thoughts of death.  He reports that his symptoms have increased due to multiple stomach surgeries, deconditioning, and increase in pain. Patient is on TPN to improve nutrition, assist with weight gain. He is also on a dysphagia 3 diet at this time, with no assistance required to feed himself. Patient has met his medically goals and now medically stable for transfer to inpatient psychiatric unit.  Past Psychiatric History: Depression  Risk to Self:   yes suicide attempt Risk to Others:   Denies Prior  Inpatient Therapy:   Denies Prior Outpatient Therapy:   Denies  Past Medical History:  Past Medical History:  Diagnosis Date   Acute prostatitis 07/24/2007   Qualifier: Diagnosis of  By: [name] MD, [name]    Allergy    mild   Arthritis    osteoarthritis   Asthma    Blood transfusion without reported diagnosis    BPH (benign prostatic hypertrophy) with urinary obstruction    Crohn's ileitis ([place]) suspected 05/03/2017   Dilated aortic root ([place])    noted on echo 08/2012   Diverticulitis of colon    EPIDIDYMITIS 02/15/2010   Qualifier: Diagnosis of  By: [name] MD, [name]    GERD (gastroesophageal reflux disease)    H/O: GI bleed    Hemorrhoids    Hepatitis 1975   unknown type    HERPES SIMPLEX INFECTION 10/14/2007   Qualifier: Diagnosis of  By: [name] MD, [name]    Hiatal hernia    Ileus following gastrointestinal surgery ([place]) 12/26/2011   Long term (current) use of systemic steroids 06/18/2018   Psoriasis    sees [name] at [facility].   Recurrent ventral incisional hernia 05/10/2012   SVT (supraventricular tachycardia)    Ulcer 08/21/2013   ileal    Past Surgical History:  Procedure Laterality Date   BOWEL RESECTION  12/19/2011   Procedure: SMALL BOWEL RESECTION;  Surgeon: [name], MD;  Location: [facility];  Service: General;  Laterality: N/A;  with anastamosis and insertion mesh   BRONCHOSCOPY     COLON SURGERY  01/2004   x 2   COLONOSCOPY W/ BIOPSIES  04/26/2017   per [name], no polyps, benign inflammation, repeat in 5 yrs    CYSTOSCOPY     ESOPHAGOGASTRODUODENOSCOPY     HEMICOLECTOMY     left side, at [facility], diverticulitis   [place]     [phone] incisional hernia   ILEOSTOMY     ILEOSTOMY CLOSURE     INSERTION OF MESH  07/31/2012   Procedure: INSERTION OF MESH;  Surgeon: [name], MD;  Location: [facility];  Service: General;;   LAPAROTOMY  12/19/2011   Procedure: EXPLORATORY LAPAROTOMY;  Surgeon:  [name], MD;  Location: [facility];  Service: General;  Laterality: N/A;   PACEMAKER IMPLANT N/A 12/01/2020   Procedure: PACEMAKER IMPLANT;  Surgeon: [name], MD;  Location: [place] CV [facility];  Service: Cardiovascular;  Laterality: N/A;   PACEMAKER INSERTION Left    TONSILLECTOMY     UPPER GASTROINTESTINAL ENDOSCOPY     VASECTOMY     VENTRAL HERNIA REPAIR  07/31/2012   Procedure: HERNIA REPAIR VENTRAL ADULT;  Surgeon: [name], MD;  Location: [facility];  Service: General;  Laterality: N/A;   Family History:  Family History  Problem Relation Age of Onset   Lung cancer Mother    Leukemia Father    Hypertension Father    Heart disease Father    Heart attack Father    Prostate cancer Father    Prostate cancer Paternal Uncle    Colon cancer Neg Hx    Stomach cancer Neg Hx    Colon polyps Neg Hx    Esophageal cancer Neg Hx    Rectal cancer Neg Hx    Family Psychiatric  History: none reported  Social History:  Social History   Substance and Sexual Activity  Alcohol Use Not Currently   Comment: occ     Social History   Substance and Sexual Activity  Drug Use Yes   Types: Oxycodone    Social History   Socioeconomic History   Marital status: Married    Spouse name: Not on file   Number of children: 2   Years of education: Not on file   Highest education level: Not on file  Occupational History   Occupation: EHS Freight forwarder    Employer: [employer] education [employer]  Tobacco Use   Smoking status: Never   Smokeless tobacco: Never  Vaping Use   Vaping Use: Never used  Substance and Sexual Activity   Alcohol use: Not Currently    Comment: occ   Drug use: Yes    Types: Oxycodone   Sexual activity: Not on file  Other Topics Concern   Not on file  Social History Narrative   He is married with 2 sons 1 son is an Art gallery manager and 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   He is  Nurse, 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   Rare if any caffeine   Rare alcohol and never smoker   Social Determinants of Radio broadcast assistant Strain: [name]  (04/10/2022)   Overall Financial Resource Strain (CARDIA)    Difficulty of Paying Living Expenses: Not hard at all  Food Insecurity: No Food Insecurity (05/14/2022)   Hunger Vital Sign    Worried About Running Out of Food in the Last Year: Never true    [place] in the Last Year: Never true  Transportation Needs: No Transportation Needs (05/14/2022)   PRAPARE - Hydrologist (Medical): No    Lack of Transportation (Non-Medical): No  Physical Activity: Inactive (04/10/2022)   Exercise Vital Sign    Days of Exercise per Week: 0 days    Minutes of Exercise per Session: 0 min  Stress: Stress Concern Present (04/10/2022)   [place]    Feeling of Stress : To some extent  Social Connections: Not on file   Additional Social History:    Allergies:   Allergies  Allergen Reactions   Purinethol [Mercaptopurine] Other (See Comments)    Pancreatitis    Shellfish Allergy Anaphylaxis and Swelling    Can use standard SMOF lipid formulation for TPN without any issue.    Humira [Adalimumab] Other (See Comments)    Developed antibodies   Tape Rash   Wound Dressing Adhesive Rash    Labs:  Results for orders placed or performed during the hospital encounter of 05/13/22 (from the past 48 hour(s))  Glucose, capillary     Status: Abnormal   Collection Time: 05/30/22  8:24 PM  Result Value Ref Range   Glucose-Capillary 106 (H) 70 - 99 mg/dL    Comment: Glucose reference range applies only to samples taken after fasting for at least 8 hours.  Glucose, capillary     Status: Abnormal   Collection Time: 05/31/22 12:56 PM  Result Value Ref Range   Glucose-Capillary 124 (H) 70 - 99 mg/dL    Comment:  Glucose reference range applies only to samples taken after fasting for at least 8 hours.  SARS Coronavirus 2 by RT PCR (hospital order, performed in  [place] hospital lab) *cepheid single result test* Anterior Nasal Swab     Status: None   Collection Time: 05/31/22  5:50 PM   Specimen: Anterior Nasal Swab  Result Value Ref Range   SARS Coronavirus 2 by RT PCR NEGATIVE NEGATIVE    Comment: (NOTE) SARS-CoV-2 target nucleic acids are NOT DETECTED.  The SARS-CoV-2 RNA is generally detectable in upper and lower respiratory specimens during the acute phase of infection. The lowest concentration of SARS-CoV-2 viral copies this assay can detect is 250 copies / mL. A negative result does not preclude SARS-CoV-2 infection and should not be used as the sole basis for treatment or other patient management decisions.  A negative result may occur with improper specimen collection / handling, submission of specimen other than nasopharyngeal swab, presence of viral mutation(s) within the areas targeted by this assay, and inadequate number of viral copies (<250 copies / mL). A negative result must be combined with clinical observations, patient history, and epidemiological information.  Fact Sheet for Patients:   https://www.fda.gov/media/158405/download  Fact Sheet for Healthcare Providers: https://www.fda.gov/media/158404/download  This test is not yet approved or  cleared by the [place] FDA and has been authorized for detection and/or diagnosis of SARS-CoV-2 by FDA under an Emergency Use Authorization (EUA).  This EUA will remain in effect (meaning this test can be used) for the duration of the COVID-19 declaration under Section 564(b)(1) of the Act, 21 U.S.C. section 360bbb-3(b)(1), unless the authorization is terminated or revoked sooner.  Performed at [facility], [address] [place], [place] [address]   Glucose, capillary     Status: Abnormal    Collection Time: 05/31/22  8:07 PM  Result Value Ref Range   Glucose-Capillary 158 (H) 70 - 99 mg/dL    Comment: Glucose reference range applies only to samples taken after fasting for at least 8 hours.  Comprehensive metabolic panel     Status: Abnormal   Collection Time: 06/01/22  4:22 AM  Result Value Ref Range   Sodium 137 135 - 145 mmol/L   Potassium 3.7 3.5 - 5.1 mmol/L   Chloride 107 98 - 111 mmol/L   CO2 26 22 - 32 mmol/L   Glucose, Bld 115 (H) 70 - 99 mg/dL    Comment: Glucose reference range applies only to samples taken after fasting for at least 8 hours.   BUN 26 (H) 8 - 23 mg/dL   Creatinine, Ser 0.96 0.61 - 1.24 mg/dL   Calcium 8.2 (L) 8.9 - 10.3 mg/dL   Total Protein 6.3 (L) 6.5 - 8.1 g/dL   Albumin 2.5 (L) 3.5 - 5.0 g/dL   AST 26 15 - 41 U/L   ALT 30 0 - 44 U/L   Alkaline Phosphatase 163 (H) 38 - 126 U/L   Total Bilirubin 0.6 0.3 - 1.2 mg/dL   GFR, Estimated >60 >60 mL/min    Comment: (NOTE) Calculated using the CKD-EPI Creatinine Equation (2021)    Anion gap 4 (L) 5 - 15    Comment: Performed at [facility], [address] [place], [place] [address]  Magnesium     Status: None   Collection Time: 06/01/22  4:22 AM  Result Value Ref Range   Magnesium 1.9 1.7 - 2.4 mg/dL    Comment: Performed at [facility], [address] [place], [place] [address]  Phosphorus     Status: None   Collection Time: 06/01/22  4:22 AM  Result Value Ref   Range   Phosphorus 3.2 2.5 - 4.6 mg/dL    Comment: Performed at [facility], [address] [place], [place] [address]  Triglycerides     Status: None   Collection Time: 06/01/22  4:22 AM  Result Value Ref Range   Triglycerides 47 <150 mg/dL    Comment: Performed at [facility], [address] [place], [place] [address]  Glucose, capillary     Status: None   Collection Time: 06/01/22  1:13 PM  Result Value Ref Range   Glucose-Capillary 88 70 - 99  mg/dL    Comment: Glucose reference range applies only to samples taken after fasting for at least 8 hours.    Current Facility-Administered Medications  Medication Dose Route Frequency Provider Last Rate Last Admin   acetaminophen (TYLENOL) suppository 325 mg  325 mg Rectal Q4H PRN McClung, Jeffrey T, MD   325 mg at 05/23/22 1124   busPIRone (BUSPAR) tablet 5 mg  5 mg Oral BID Thompson, Daniel V, MD   5 mg at 06/01/22 0933   Chlorhexidine Gluconate Cloth 2 % PADS 6 each  6 each Topical Daily McClung, Jeffrey T, MD   6 each at 06/01/22 1130   enoxaparin (LOVENOX) injection 40 mg  40 mg Subcutaneous Q24H McClung, Jeffrey T, MD   40 mg at 05/31/22 2257   HYDROmorphone (DILAUDID) injection 0.5 mg  0.5 mg Intravenous Q6H PRN Anwar, Zeba, MD   0.5 mg at 06/01/22 1204   insulin aspart (novoLOG) injection 0-9 Units  0-9 Units Subcutaneous 2 times per day Williamson, Erin R, RPH   2 Units at 05/31/22 2055   insulin aspart (novoLOG) injection 0-9 Units  0-9 Units Subcutaneous 2 times per day Williamson, Erin R, RPH       lidocaine (LIDODERM) 5 % 1 patch  1 patch Transdermal Q24H Bowser, Grace E, NP   1 patch at 05/31/22 1737   morphine (MSIR) tablet 15 mg  15 mg Oral Q6H PRN Thompson, Daniel V, MD   15 mg at 06/01/22 1435   ondansetron (ZOFRAN) injection 4 mg  4 mg Intravenous Q6H PRN Patel, Pranav M, MD   4 mg at 06/01/22 1046   Oral care mouth rinse  15 mL Mouth Rinse PRN Smith, Daniel C, MD       pantoprazole (PROTONIX) EC tablet 40 mg  40 mg Oral Daily Thompson, Daniel V, MD   40 mg at 06/01/22 0933   prochlorperazine (COMPAZINE) tablet 10 mg  10 mg Oral Q6H PRN Thompson, Daniel V, MD   10 mg at 06/01/22 0704   psyllium (HYDROCIL/METAMUCIL) 1 packet  1 packet Oral Daily Thompson, Daniel V, MD   1 packet at 05/28/22 0909   sertraline (ZOLOFT) tablet 75 mg  75 mg Oral Daily Starkes-Perry, Mlissa Tamayo S, FNP   75 mg at 06/01/22 0933   simethicone (MYLICON) chewable tablet 80 mg  80 mg Oral TID WC & HS  Thompson, Daniel V, MD   80 mg at 06/01/22 1233   sodium chloride flush (NS) 0.9 % injection 10-40 mL  10-40 mL Intracatheter PRN McClung, Jeffrey T, MD   20 mL at 05/23/22 1733   sulfamethoxazole-trimethoprim (BACTRIM DS) 800-160 MG per tablet 1 tablet  1 tablet Oral Q12H Thompson, Daniel V, MD   1 tablet at 06/01/22 0933   tamsulosin (FLOMAX) capsule 0.4 mg  0.4 mg Oral QPC supper Thompson, Daniel V, MD   0.4 mg at 05/31/22 1738   TPN   CYCLIC-ADULT (ION)   Intravenous Cyclic-See Admin Instructions Williamson, Erin R, RPH        Musculoskeletal: Strength & Muscle Tone: Laying in bed   Gait & Station: Laying in bed   Patient leans: Laying in bed      Psychiatric Specialty Exam: Psychiatric Specialty Exam: Physical Exam Vitals and nursing note reviewed.  Constitutional:      Appearance: Normal appearance. He is normal weight.  Skin:    General: Skin is warm.     Capillary Refill: Capillary refill takes less than 2 seconds.  Neurological:     General: No focal deficit present.     Mental Status: He is alert and oriented to person, place, and time. Mental status is at baseline.  Psychiatric:        Attention and Perception: Attention and perception normal.        Mood and Affect: Affect normal. Mood is anxious and depressed.        Speech: Speech normal.        Behavior: Behavior normal. Behavior is cooperative.        Thought Content: Thought content normal.        Cognition and Memory: Cognition normal.        Judgment: Judgment normal.     Review of Systems  Psychiatric/Behavioral:  Positive for depression and suicidal ideas (denies). Negative for hallucinations and substance abuse. The patient is nervous/anxious. The patient does not have insomnia.   All other systems reviewed and are negative.   Blood pressure (!) 103/53, pulse 71, temperature 98.3 F (36.8 C), temperature source Oral, resp. rate 17, height 5' 10" (1.778 m), weight 68.1 kg, SpO2 96 %.Body mass index is  21.54 kg/m.  General Appearance: Fairly Groomed  Eye Contact:  Good  Speech:  Clear and Coherent and Normal Rate  Volume:  Normal  Mood:  Anxious  Affect:  Appropriate and Congruent  Thought Process:  Coherent, Goal Directed, Linear, and Descriptions of Associations: Intact  Orientation:  Full (Time, Place, and Person)  Thought Content:  Logical  Suicidal Thoughts:  No  Homicidal Thoughts:  No  Memory:  Immediate;   Fair Recent;   Fair  Judgement:  Fair  Insight:  Fair  Psychomotor Activity:  Normal  Concentration:  Concentration: Fair and Attention Span: Fair  Recall:  Fair  Fund of Knowledge:  Good  Language:  Good  Akathisia:  No  Handed:  Right  AIMS (if indicated):     Assets:  Communication Skills Desire for Improvement Financial Resources/Insurance Leisure Time Resilience Social Support  ADL's:  Intact  Cognition:  WNL  Sleep:         Physical Exam: Physical Exam Vitals and nursing note reviewed.  Constitutional:      Appearance: Normal appearance. He is normal weight.  Skin:    General: Skin is warm.     Capillary Refill: Capillary refill takes less than 2 seconds.  Neurological:     General: No focal deficit present.     Mental Status: He is alert and oriented to person, place, and time. Mental status is at baseline.  Psychiatric:        Attention and Perception: Attention and perception normal.        Mood and Affect: Affect normal. Mood is anxious and depressed.        Speech: Speech normal.        Behavior: Behavior normal. Behavior is cooperative.          Thought Content: Thought content normal.        Cognition and Memory: Cognition normal.        Judgment: Judgment normal.    Review of Systems  Psychiatric/Behavioral:  Positive for depression and suicidal ideas (denies). Negative for hallucinations and substance abuse. The patient is nervous/anxious. The patient does not have insomnia.   All other systems reviewed and are negative.  pt  asleep unable to assess  Blood pressure (!) 103/53, pulse 71, temperature 98.3 F (36.8 C), temperature source Oral, resp. rate 17, height 5' 10" (1.778 m), weight 68.1 kg, SpO2 96 %. Body mass index is 21.54 kg/m.  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  Recommend [facility] to [facility], as patient has been difficult to place. -Increase sertraline 100 mg p.o. daily.   -Pt can have access to his cell phone when family is present.   Disposition: Recommend psychiatric Inpatient admission when medically cleared.  Suella Broad, FNP 06/01/2022 3:08 PM

## 2022-06-01 NOTE — Progress Notes (Signed)
PHARMACY - TOTAL PARENTERAL NUTRITION CONSULT NOTE   Indication: Crohn's disease and complex past surgical history including ex lap with small bowel resection, lysis of adhesion, mesh explantation, and diverting loop ileostomy in April 2023, on TPN prior to admission  Patient Measurements: Height: _0  (177.8 cm) Weight: 68.1 kg (150 lb 1.6 oz) IBW/kg (Calculated) : 73 TPN AdjBW (KG): 68.1 Body mass index is 21.54 kg/m.  Assessment: Patient with PMH of Crohn's disease and complex surgical history as above on chronic TPN who presented on 05/13/22 with altered mental status after spouse found patient unresponsive with multiple pill bottles open around him. Patient was transported from SNF to ED the day prior for generalized weakness and acute worsening of his chronic abdominal pain. Patient decided to return home instead of his SNF. Pharmacy consulted to resume TPN as inpatient.   Discharge from [place] 04/19/22:  TPN pharmacist at [facility]:  Estimated Nutrient Requirements: Protein: 1.5 - 1.7 g/kg/day (98 - 104 g/day), Total kcal: 30 - 35 kcal/kg/day (1830 - 1983 kcal/day)  TPN Formulation at [facility] per TPN pharmacist: [A 6.6%, D 20%, F 3.1%] @ goal rate, 65 mL/hr (TV: 1560 mL/day) provides: Protein 1.7 g/kg/day 103 g/day, Total kcal 32 kcal/kg/day 1971 kcal/day  NPC: N ratio 94   Admitted at [facility] 04/29/22-05/03/22: Nutritional Goals: TPN 80 mL/hr (provides 113 g of protein and 1943 kcals per day) RD Assessment: (10/16): 2100-2450kcal (30-35kcal/kg), Protein 105-130g (1.5-1.8g/kg) Utilize intralipid rather than SMOF as 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  [facility]: [name], PharmD, ph: [phone] 10/28 Most recent formula: Total volume 1560 mL:  Dextrose 220 g/day, Plenamine 120 g/day, SMOFlipid 50 g/day, NaCl 75 mEq, CaGluc 6 mEq, Potassium Ac 20 mEq, Potassium Phos 13.5 nM, Mag Sulfate 8 mEq, Vitamins/trace  elements  Glucose / Insulin: No hx DM.  - on sSSI (2 units required in the past 24 hrs) - cbgs (goal <150): wnl Electrolytes: lytes wnl Renal: SCr ok; BUN slightly elevated at 26; UOP 1589m/24hr Hepatic:  - AST/ALT bumped on 11/6, but now down to wnl - Alk phos elevated  - Albumin low 2.5 - TG 47 (11/16) I/O: - 227 mL recorded - ileostomy output 650 ml/day - osmolality 298 slightly elevated - MIVF: dc'd 11/11 GI Imaging: - 11/6 abd [place]- normal liver (checked d/t elevated LFTs) - 10/27 CTa/p: no significant interval change since 10/14 CT. No evidence of bowel obstruction or other complicating feature at the anastomotic site or ostomy. - 10/14 CTa/p: previous surgical changes noted (transverse-sigmoid anastomosis) with stable inflammation since 10/12 CT GI Surgeries / Procedures: n/a  Central access: prior to admission - double lumen CVC TPN start date: started PTA  Nutritional Goals: TPN provides 131 g of protein and 2010 kcals per day.  RD Assessment this admission:    Estimated Needs Total Energy Estimated Needs: 2015-2350kcal Total Protein Estimated Needs: 100-135g Total Fluid Estimated Needs: 24064m(Holliday Segar)  Current Nutrition:  TPN Dysphagia-3 11/9 >>   Plan:  Now: KDur 4059DGLO x 1  Cyclic TPN with 218756l, change cycle interval to 16 hr cycle GIR 2.36 - 4.71 Electrolytes in TPN:  Na 75 mEq/L K 45 mEq/L Ca 5 mEq/L Mg 4 mEq/L Phos 15 mmol/L Cl:Ac = 1:1 Add standard MVI and trace elements to TPN continue pepcid in TPN  With excellent CBGs, will limit finger sticks to twice daily - 2 hrs after TPN start, 1  hour after TPN stop IVF dc'd by MD Monitor TPN labs on Mon/Thurs at minimum  Peggyann Juba, PharmD, [place]: [phone] 06/01/2022 7:03 AM

## 2022-06-01 NOTE — Plan of Care (Signed)
  Problem: Education: Goal: Knowledge of General Education information will improve Description: Including pain rating scale, medication(s)/side effects and non-pharmacologic comfort measures Outcome: Progressing   Problem: Health Behavior/Discharge Planning: Goal: Ability to manage health-related needs will improve Outcome: Progressing   Problem: Clinical Measurements: Goal: Ability to maintain clinical measurements within normal limits will improve Outcome: Progressing Goal: Will remain free from infection Outcome: Progressing Goal: Diagnostic test results will improve Outcome: Progressing Goal: Cardiovascular complication will be avoided Outcome: Progressing   Problem: Activity: Goal: Risk for activity intolerance will decrease Outcome: Progressing   Problem: Nutrition: Goal: Adequate nutrition will be maintained Outcome: Progressing   Problem: Coping: Goal: Level of anxiety will decrease Outcome: Progressing   Problem: Elimination: Goal: Will not experience complications related to urinary retention Outcome: Progressing   Problem: Pain Managment: Goal: General experience of comfort will improve Outcome: Progressing   Problem: Safety: Goal: Ability to remain free from injury will improve Outcome: Progressing   Problem: Skin Integrity: Goal: Risk for impaired skin integrity will decrease Outcome: Progressing   Problem: Activity: Goal: Ability to tolerate increased activity will improve Outcome: Progressing

## 2022-06-01 NOTE — Progress Notes (Signed)
SLP Cancellation Note  Patient Details Name: [name] SEES MRN: [id number] DOB: 1951-06-11   Cancelled treatment:       Reason Eval/Treat Not Completed: Other (comment);Medical issues which prohibited therapy (pt reports he recently vomited and doesn't feel well, received Zofran and scheduled medication, requested Dr Carlean Purl see him due to nausea) Will attempt to follow up next date - in hopes to start pharyngeal exercises to improve his swallow.   Pt agreeable.   Kathleen Lime, MS [facility] SLP Acute Rehab Services Office [phone] Pager [phone]  Macario Golds 06/01/2022, 11:47 AM

## 2022-06-01 NOTE — TOC Progression Note (Signed)
Transition of Care [facility]) - Progression Note    Patient Details  Name: [name] MRN: [id number] Date of Birth: [birth year]  Transition of Care [facility]) CM/SW Contact  Joaquin Courts, RN Phone Number: 06/01/2022, 12:34 PM  Clinical Narrative:    CM outreached to [facility] admissions, who report patient is still under review with not etimated timeline for decision.   Expected Discharge Plan:  (TBD) Barriers to Discharge: Continued Medical Work up  Expected Discharge Plan and Services Expected Discharge Plan:  (TBD)   Discharge Planning Services: CM Consult Post Acute Care Choice: [place] Living arrangements for the past 2 months: Single Family Home                                       Social Determinants of Health (SDOH) Interventions    Readmission Risk Interventions    05/15/2022   10:53 AM  Readmission Risk Prevention Plan  Transportation Screening Complete  Medication Review (RN Care Manager) Complete  PCP or Specialist appointment within 3-5 days of discharge Complete  HRI or Home Care Consult Complete  SW Recovery Care/Counseling Consult Complete  [place] Complete

## 2022-06-01 NOTE — Plan of Care (Signed)
  Problem: Pain Managment: Goal: General experience of comfort will improve Outcome: Progressing   Problem: Safety: Goal: Ability to remain free from injury will improve Outcome: Progressing   

## 2022-06-01 NOTE — Progress Notes (Signed)
Nutrition Follow-up  DOCUMENTATION CODES:  Severe malnutrition in context of chronic illness  INTERVENTION:  -Continue current TPN: 131.25g protein (1.9g/kg), 288.75g dextrose (GIR 4.50m/kg/min), 50.4g lipids (25% calories) and 2011kcal (29.5kcal/kg) in 21039mfluid. -Continue diet per SLP recommendations  NUTRITION DIAGNOSIS:  Severe Malnutrition related to chronic illness as evidenced by percent weight loss, severe fat depletion, severe muscle depletion. progressing  GOAL:  Patient will meet greater than or equal to 90% of their needs progressing  MONITOR:  Diet advancement, Labs, Other (Comment) (TPN)  REASON FOR ASSESSMENT:  Consult New TPN/TNA  ASSESSMENT:  Pt is a 71yo with PMH of Crohn's disease resulting in a complicated surgical history followed at [facility] to include left hemicolectomy and ileostomy with multiple other complications to include TPN dependence, chronic prostatitis, SVT, and asthma who presents with generalized weakness, acute worsening of his chronic abdominal pain and AMS.  Pt with poor po intake today due to n/v. On previous date was consuming 50% of meals. TPN continues to meet estimated nutrient needs. Labs reviewed, Alk phos remains elevated but stable, ALT and AST WNL with cyclical TPN, toal bili WNL and lytes WNL. Weight history in EMR shows a desirable 0.9kg weight gain during admission.  Date Weight  05/26/22 1215 68.1 kg  05/15/22 1727 64.7 kg  05/15/22 1400 66.3 kg  05/13/22 18:32:53 67.2 kg   Current TPN order: 131.25g protein (1.9g/kg), 288.75g dextrose (GIR 4.5m87mg/min), 50.4g lipids (25% calories) and 2011kcal (29.5kcal/kg) in 2100m63muid.  Diet Order:   Diet Order             DIET DYS 3 Room service appropriate? Yes; Fluid consistency: Thin  Diet effective now                   EDUCATION NEEDS:  Education needs have been addressed  Skin:  Skin Assessment: Reviewed RN Assessment  Height:  Ht Readings from Last 1  Encounters:  05/26/22 _0  (1.778 m)    Weight:  Wt Readings from Last 1 Encounters:  05/26/22 68.1 kg    BMI:  Body mass index is 21.54 kg/m.  Estimated Nutritional Needs:  Kcal:  2015-2350kcal Protein:  100-135g Fluid:  2400mL8mlliday Segar)  KatieCandise Bowens RD, LDN, CNSC See AMiON for contact information

## 2022-06-02 DIAGNOSIS — J9601 Acute respiratory failure with hypoxia: Secondary | ICD-10-CM | POA: Diagnosis not present

## 2022-06-02 DIAGNOSIS — G9341 Metabolic encephalopathy: Secondary | ICD-10-CM | POA: Diagnosis not present

## 2022-06-02 DIAGNOSIS — D649 Anemia, unspecified: Secondary | ICD-10-CM | POA: Diagnosis not present

## 2022-06-02 DIAGNOSIS — E43 Unspecified severe protein-calorie malnutrition: Secondary | ICD-10-CM | POA: Diagnosis not present

## 2022-06-02 LAB — COMPREHENSIVE METABOLIC PANEL
ALT: 35 U/L (ref 0–44)
AST: 49 U/L — ABNORMAL HIGH (ref 15–41)
Albumin: 2.4 g/dL — ABNORMAL LOW (ref 3.5–5.0)
Alkaline Phosphatase: 129 U/L — ABNORMAL HIGH (ref 38–126)
Anion gap: 5 (ref 5–15)
BUN: 25 mg/dL — ABNORMAL HIGH (ref 8–23)
CO2: 25 mmol/L (ref 22–32)
Calcium: 8.3 mg/dL — ABNORMAL LOW (ref 8.9–10.3)
Chloride: 106 mmol/L (ref 98–111)
Creatinine, Ser: 0.84 mg/dL (ref 0.61–1.24)
GFR, Estimated: >60 mL/min (ref >60)
Glucose, Bld: 101 mg/dL — ABNORMAL HIGH (ref 70–99)
Potassium: 3.9 mmol/L (ref 3.5–5.1)
Sodium: 136 mmol/L (ref 135–145)
Total Bilirubin: 0.4 mg/dL (ref 0.3–1.2)
Total Protein: 6.1 g/dL — ABNORMAL LOW (ref 6.5–8.1)

## 2022-06-02 LAB — GLUCOSE, CAPILLARY
Glucose-Capillary: 118 mg/dL — ABNORMAL HIGH (ref 70–99)
Glucose-Capillary: 95 mg/dL (ref 70–99)

## 2022-06-02 MED ORDER — TRAVASOL 10 % IV SOLN
INTRAVENOUS | Status: AC
  Filled 2022-06-02: qty 1312.5

## 2022-06-02 MED ORDER — VITAMIN B-12 1000 MCG PO TABS
1000.0000 ug | ORAL_TABLET | Freq: Every day | ORAL | Status: DC
  Administered 2022-06-03 – 2022-06-23 (×20): 1000 ug via ORAL
  Filled 2022-06-02 (×21): qty 1

## 2022-06-02 MED ORDER — INSULIN ASPART 100 UNIT/ML IJ SOLN
0.0000 [IU] | INTRAMUSCULAR | Status: DC
  Administered 2022-06-04 – 2022-06-18 (×7): 1 [IU] via SUBCUTANEOUS

## 2022-06-02 NOTE — Progress Notes (Signed)
PHARMACY - TOTAL PARENTERAL NUTRITION CONSULT NOTE   Indication: Crohn's disease and complex past surgical history including ex lap with small bowel resection, lysis of adhesion, mesh explantation, and diverting loop ileostomy in April 2023, on TPN prior to admission  Patient Measurements: Height: _0  (177.8 cm) Weight: 68.1 kg (150 lb 1.6 oz) IBW/kg (Calculated) : 73 TPN AdjBW (KG): 68.1 Body mass index is 21.54 kg/m.  Assessment: Patient with PMH of Crohn's disease and complex surgical history as above on chronic TPN who presented on 05/13/22 with altered mental status after spouse found patient unresponsive with multiple pill bottles open around him. Patient was transported from SNF to ED the day prior for generalized weakness and acute worsening of his chronic abdominal pain. Patient decided to return home instead of his SNF. Pharmacy consulted to resume TPN as inpatient.   Discharge from [place] 04/19/22:  TPN pharmacist at [place]:  Estimated Nutrient Requirements: Protein: 1.5 - 1.7 g/kg/day (98 - 104 g/day), Total kcal: 30 - 35 kcal/kg/day (1830 - 1983 kcal/day)  TPN Formulation at [place] per TPN pharmacist: [A 6.6%, D 20%, F 3.1%] @ goal rate, 65 mL/hr (TV: 1560 mL/day) provides: Protein 1.7 g/kg/day 103 g/day, Total kcal 32 kcal/kg/day 1971 kcal/day  NPC: N ratio 94   Admitted at [place] 04/29/22-05/03/22: Nutritional Goals: TPN 80 mL/hr (provides 113 g of protein and 1943 kcals per day) RD Assessment: (10/16): 2100-2450kcal (30-35kcal/kg), Protein 105-130g (1.5-1.8g/kg) Utilize intralipid rather than SMOF as 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  [facility]: [name], PharmD, ph: [phone] 10/28 Most recent formula: Total volume 1560 mL:  Dextrose 220 g/day, Plenamine 120 g/day, SMOFlipid 50 g/day, NaCl 75 mEq, CaGluc 6 mEq, Potassium Ac 20 mEq, Potassium Phos 13.5 nM, Mag Sulfate 8 mEq, Vitamins/trace  elements  Glucose / Insulin: No hx DM.  - on sSSI (none required in the past 24 hrs) - cbgs (goal <150): wnl Electrolytes: lytes wnl; 4 runs KCl given yesterday Renal: SCr ok; BUN slightly elevated at 25; UOP remains adequate, foley dc'd 11/16 Hepatic:  - AST/ALT bumped on 11/6, but now down to wnl - Alk phos remains elevated but improving  - Albumin low 2.4 - TG 47 (11/16) I/O: - 736 mL recorded - ileostomy output 515 ml/day - osmolality 298 slightly elevated on 11/10 - MIVF: dc'd 11/11 GI Imaging: - 11/6 abd [place] - normal liver (checked d/t elevated LFTs) - 10/27 CTa/p: no significant interval change since 10/14 CT. No evidence of bowel obstruction or other complicating feature at the anastomotic site or ostomy. - 10/14 CTa/p: previous surgical changes noted (transverse-sigmoid anastomosis) with stable inflammation since 10/12 CT GI Surgeries / Procedures: n/a  Central access: prior to admission - double lumen CVC TPN start date: started PTA  Nutritional Goals: TPN provides 131 g of protein and 2010 kcals per day.  RD Assessment this admission:    Estimated Needs Total Energy Estimated Needs: 2015-2350kcal Total Protein Estimated Needs: 100-135g Total Fluid Estimated Needs: 2427m (Holliday Segar)  Current Nutrition:  TPN Dysphagia-3 11/9 >>   Plan:   Cyclic TPN with 28592ml, change interval to 14 hr cycle GIR 2.73 - 5.45 Electrolytes in TPN:  Na 75 mEq/L K 45 mEq/L Ca 5 mEq/L Mg 4 mEq/L Phos 15 mmol/L Cl:Ac = 1:1 Add standard MVI and trace elements to TPN continue pepcid in TPN  With excellent CBGs, will limit finger sticks to twice daily -  2 hrs after TPN start, 1 hour after TPN stop IVF dc'd by MD Monitor TPN labs on Mon/Thurs at minimum; CMET in AM  Peggyann Juba, PharmD, [place]: [phone] 06/02/2022 7:15 AM

## 2022-06-02 NOTE — TOC Progression Note (Signed)
Transition of Care [facility]) - Progression Note    Patient Details  Name: [name] MRN: [id number] Date of Birth: 22-Nov-1950  Transition [facility]) CM/SW Contact  Lennart Pall, LCSW Phone Number: 06/02/2022, 1:24 PM  Clinical Narrative:    Have confirmed with [facility] that pt has been accepted for admission and placed on the waitlist for a bed.  They will inform TOC when bed is available.   Expected Discharge Plan:  (TBD) Barriers to Discharge: Continued Medical Work up  Expected Discharge Plan and Services Expected Discharge Plan:  (TBD)   Discharge Planning Services: CM Consult Post Acute Care Choice: [place] Living arrangements for the past 2 months: Single Family Home                                       Social Determinants of Health (SDOH) Interventions    Readmission Risk Interventions    05/15/2022   10:53 AM  Readmission Risk Prevention Plan  Transportation Screening Complete  Medication Review (RN Care Manager) Complete  PCP or Specialist appointment within 3-5 days of discharge Complete  HRI or Home Care Consult Complete  SW Recovery Care/Counseling Consult Complete  [place] Complete

## 2022-06-02 NOTE — Progress Notes (Signed)
Care call to patient's wife, [name] [phone]):  Patient feels that she was treated with empathy.  She does not want her husband treated with drugs for her husband to treat her pain. She is concerned about his discharge planning, noting that patient's son being a IT trainer and in the [employer].  Miss [name] would only like to speak with the psychiatrist for future discharge planning discussions. Please ask the social work team to coordinate a discharge planning meeting with wife.  Wife states that she is going to get advice from a Chief Executive Officer.  Lavella Hammock, MD

## 2022-06-02 NOTE — Consult Note (Addendum)
[place] Nurse ostomy follow up Refer to previous [place] consult note on 11/15.  Bedside nurses requested assistance with ostomy care yesterday evening when [place] nurse was not available since pouch was leaking. A new one was applied by the bedside nurse.  It is intact with good seal, large amt liquid brown stool.  Pt requested current pouch be left in place since it is not leaking.     Instructions have been provided for bedside nurses to perfom as follows: Apply as directed below: (all products are at the bedside) 1. Use adhesive remover wipes to assist with removing previous pouch.  More can be found in the supply room. 2. Cleanse skin with moist washcloth, then apply stoma powder, Kellie Simmering # 6) brush away extra powder, then blot with skin prep wipes 3. Apply barrier ring, stretch around stoma to fit the edges Kellie Simmering # [phone]) 4. Cut opening to just inside 1 inch, then apply one piece convex pouch Kellie Simmering # [id number]) in an up and down fashion, with spout pointing down towards leg 5. Apply belt Kellie Simmering # 621) into belt loops on the pouch  and tuck to a smaller size and tape this in place so it is not too loose 6. Empty every hour to avoid overfilling which contributes towards leakage.  Please re-consult if further assistance is needed.    5 sets of barrier rings and flexible convex pouches ordered to the room for staff nurse's use. Gae Dry MSN, RN, [place], [place], [place]      Revision History

## 2022-06-02 NOTE — Progress Notes (Signed)
[facility] Progress Note Patient: [name] GTX:[id number] DOB: 05/31/1951 DOA: 05/13/2022  DOS: the patient was seen and examined on 06/02/2022  Brief hospital course: 71yo with hx Crohn's with persistent abd pain s/p extensive surgeries and is currently TPN dependent. Pt presented with unresponsiveness in apparent suicide attempt via overdose. Pt remained unresponsive and later was unable to maintain airway, prompting intubation and transfer to ICU. Pt now extubated and care transferred to hospitalist. Assessment and Plan: Intentional drug overdose. Suicide ideation. MDD. Patient presented with an unresponsive event at home. Noted to have intentionally taken pain medication and anxiolytics with intention for suicide. Psychiatry was consulted. Recommended inpatient psychiatric admission. Patient currently medically stable although unable to find a place that can accommodate TPN. Referred to [facility], accepted and now on a wait list for a bed.   Acute toxic and metabolic encephalopathy. Likely combination of medication overdose as well as hypoxia from pneumonia. Head CT and MRI brain negative for any acute abnormality. Mentation improving. Monitor.   Aspiration pneumonia. Dysphagia. Completed IV antibiotic therapy.   Dysphagia. Speech therapy following the patient. MBS was performed and patient was on dysphagia 3 diet.  Per speech therapy changed to regular diet on the 11/17 Monitor.   Crohn's disease. Patient had multiple surgeries in the past.currently not on any medical therapy. 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 Management per pharmacy.   Severe protein calorie malnutrition. Body mass index is 21.54 kg/m.  Nutrition Problem: Severe Malnutrition Etiology: chronic illness Interventions: TPN (increase nutrients) Continue the same.   Chronic prostatitis. Bladder calculi. BPH. Seen by [name] on 10/15. Patient is on Bactrim  for 28 days since then although missed some doses while he was intubated. Currently based on my discussion with urology on 11/15 we will continue total 14-day treatment course for Bactrim. Foley catheter was placed in the ICU.  Removed on 11/16 and able to void without retention. Discontinue oxybutynin as that can cause retention and increased PVR. Start on finasteride as well. Patient will follow-up with [name] for outpatient procedure for BPH.   Ileostomy leakage with irritation and dermatitis.. Wound care consulted. Currently being treated with stoma powder and frequent emptying.   COVID exposure. Patient's family, son got tested positive on 11/15 for COVID-19. Family has been visiting the patient throughout the hospital stay. COVID-19 negative.  Currently no indication for isolation. We will perform a recheck of COVID-19 test on 11/19.   Chronic pain syndrome. Patient actually is on chronic narcotics. Presented with encephalopathy from overdose. Currently back on home regimen. Monitor.   Transient A-fib. Resolved. Monitor.   Normocytic anemia.  Iron deficiency.  Relative vitamin B12 deficiency Baseline hemoglobin around 10. Currently hemoglobin around 8.  Iron level 18.  Will provide iron supplementation. Suspect this is from frequent blood draws and poor p.o. intake and nutritional status. Vitamin B12 301.  Continue replacement.   Subjective: Denies any acute complaint.  No nausea no vomiting.  No cough no shortness of breath.  Physical Exam: General: in no distress;  Cardiovascular: S1 and S2 Present, no Murmur Respiratory: normal respiratory effort, Bilateral Air entry present, no Crackles, no wheezes Abdomen: Bowel Sound present, Non tender  Extremities: no edema Neurology: alert and oriented to time, place, and person   Data Reviewed: I have Reviewed nursing notes, Vitals, and Lab results. Since last encounter, pertinent lab results CBC and BMP   . I have  ordered test including CBC and BMP  .  Disposition: Status is: Inpatient Remains inpatient appropriate because: Unsafe discharge.  enoxaparin (LOVENOX) injection 40 mg Start: 05/13/22 2200   Family Communication: No one at bedside Level of care: Med-Surg  Vitals:   06/01/22 1316 06/01/22 2200 06/02/22 0605 06/02/22 1411  BP: (!) 103/53 (!) 113/57 (!) 121/52 (!) 117/58  Pulse: 71 77 80 85  Resp: 17 16 16 18   Temp: 98.3 F (36.8 C) 98 F (36.7 C) 98.5 F (36.9 C) 99 F (37.2 C)  TempSrc: Oral Oral Oral   SpO2: 96% 99% 97% 97%  Weight:      Height:         Author: Berle Mull, MD 06/02/2022 6:11 PM  Please look on [email or web address] to find out who is on call.

## 2022-06-02 NOTE — TOC Progression Note (Signed)
Transition of Care [facility]) - Progression Note    Patient Details  Name: [name] MRN: [id number] Date of Birth: 09/16/1950  [facility]) CM/SW Contact  Lennart Pall, LCSW Phone Number: 06/02/2022, 9:30 AM  Clinical Narrative:     Just spoke with pt's wife who requested a call from me; she was pleasant but very frustrated with overall situation.   She asked me what her "options" are for him and I explained that this is a discussion with psychiatry team and TOC will facilitate what is needed for the discharge per their recommendations. She is asking that the psychiatrist following to please call her and discuss pt's care. I have alerted psychiatry who are planning to reach out to her today.  At this point, we continue to await decision from [facility] for possible admission.  Expected Discharge Plan:  (TBD) Barriers to Discharge: Continued Medical Work up  Expected Discharge Plan and Services Expected Discharge Plan:  (TBD)   Discharge Planning Services: CM Consult Post Acute Care Choice: [place] Living arrangements for the past 2 months: Single Family Home                                       Social Determinants of Health (SDOH) Interventions    Readmission Risk Interventions    05/15/2022   10:53 AM  Readmission Risk Prevention Plan  Transportation Screening Complete  Medication Review (RN Care Manager) Complete  PCP or Specialist appointment within 3-5 days of discharge Complete  HRI or Home Care Consult Complete  SW Recovery Care/Counseling Consult Complete  [place] Complete

## 2022-06-02 NOTE — Progress Notes (Signed)
Speech Language Pathology Treatment: Dysphagia  Patient Details Name: [name] MRN: [id number] DOB: 1951/02/17 Today's Date: 06/02/2022 Time: 0233-4356 SLP Time Calculation (min) (ACUTE ONLY): 28 min  Assessment / Plan / Recommendation Clinical Impression  Pt seen to to address dysphagia goals. Pt continues with poor intake - with direct question, states he would likely eat more if diet advanced to regular.    Pt willing to consume Fritos and lemonade and he consumed approx 1/3 of frito bag and 4 sips of lemonade. Adequate mastication ability noted - with just trace oral retention.  Cough immediately post-swallow when pt "washed food down into throat" with liquids - x1 of 3 trials. Pt admits to some premorbid issues with dysphagia causing him to cough with intake.  At this time, pt appears at baseline swallow function per his statement and given he is medically stable, recommend advance diet to regular/thin.  Using teach back, reviewed clinical reasoning to diminish aspiration including pt stating "I'll choke" and then "I'll get sick" with mod I.  Pt needs diet approprite for Crohn's - stating low fiber - reached out to dietician who advised pt could choose regular and select food items. Pt reported comfort with choosing foods he can manage independently, thus order placed after MD approved. No SLP follow up needed as pt has met his swallowing goals.     HPI HPI: Pt is a 71 yo male admit 10/28 after being obtunded found down 10/30 - ? polypharmacy,  tx to ICU for intubation, 10/31 waking up and following commands overnight.  He was extubated 11/1 and failed swallow screen thus SLP eval was ordered.  He also has h/o Crohns disease - complex hx including small bowel perf -surgical history, short gut- TPN.  MRI showed No acute intracranial abnormality.  2. 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  Chest imaging concerning for  right infrahilar airspace consolidation concerning  for pneumonia or aspiration.      SLP Plan  All goals met      Recommendations for follow up therapy are one component of a multi-disciplinary discharge planning process, led by the attending physician.  Recommendations may be updated based on patient status, additional functional criteria and insurance authorization.    Recommendations  Diet recommendations: Regular;Thin liquid Liquids provided via: Teaspoon;No straw (per pt GI issue) Medication Administration: Whole meds with puree Compensations: Slow rate;Small sips/bites;Clear throat intermittently Postural Changes and/or Swallow Maneuvers: Seated upright 90 degrees;Upright 30-60 min after meal (DO NOT [place] FOOD DOWN with DRINK, FOLLOW solid with liquid)                Follow Up Recommendations: No SLP follow up Assistance recommended at discharge: None Plan: All goals met        Kathleen Lime, [facility] Office [phone] Pager [phone]    [name]  06/02/2022, 3:52 PM

## 2022-06-02 NOTE — Progress Notes (Signed)
Physical Therapy Treatment Patient Details Name: [name] MRN: [id number] DOB: 10-19-1950 Today's Date: 06/02/2022   History of Present Illness 71 year old male with complicated past medical history as outlined below, which is significant for Crohn's disease with persistent abdominal pain.  On April of this year he underwent exploratory laparotomy with small bowel resection, repair of enterotomy, and creation of a diverting loop ileostomy at [facility].  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  He has been residing in [facility] SNF from where he presented to the emergency department on 10/27 for pain at the site of his ostomy with foul-smelling discharge.  Work-up of the ostomy and abdomen including CT imaging was unremarkable.  He was discharged to SNF, however, rather than returning to SNF he found private transport to his home.Then on 10/28 he again presented to [facility] long emergency department with chief complaint of unresponsiveness.11/2 reveals that ingestion of pills at home was in fact a suicide attempt. 1:1 sitter, psych consult    PT Comments    "[name]" is making slow progress with his mobility.  General Comments: AxO x 2 slightly groggy, slow to respond.  Did just swake from a nap.  Safety sitter in room.  Reported he only; ate a part of a banana today. Pt already OOB in recliner.  Assisted with amb in hallway.  General transfer comment: pt was able to rise from recliner + 1 asisst this session but still with much effort and 75% VC's on proper hand placement to push up from nad "reach back" prior to sit to control desend. General Gait Details: Requires MAX encouragement and + 2 assist for safety such that recliner was following.  Slow progress.  50% VC's for proper walker to self distance as pt tends to push walker out too far.  Unsteady with turns.  Weak.  Tolerated amb 22 feet then needed a seated rest break.  Amb another 6 feet when B LE weakness set in.  Assisted back to bed.  General bed mobility comments: Max Asisst back to bed with increased to position to comfort. Pt will need ST Rehab at SNF to address mobility and functional decline prior to safely returning home.   Recommendations for follow up therapy are one component of a multi-disciplinary discharge planning process, led by the attending physician.  Recommendations may be updated based on patient status, additional functional criteria and insurance authorization.  Follow Up Recommendations  Skilled nursing-short term rehab (<3 hours/day)     Assistance Recommended at Discharge Frequent or constant Supervision/Assistance  Patient can return home with the following A lot of help with walking and/or transfers;A lot of help with bathing/dressing/bathroom;Assist for transportation;Help with stairs or ramp for entrance;Direct supervision/assist for medications management;Assistance with cooking/housework   Equipment Recommendations  None recommended by PT    Recommendations for Other Services       Precautions / Restrictions Precautions Precautions: Fall Precaution Comments: ostomy Restrictions Weight Bearing Restrictions: No     Mobility  Bed Mobility Overal bed mobility: Needs Assistance Bed Mobility: Sit to Supine     Supine to sit: Max assist     General bed mobility comments: Max Asisst back to bed with increased to position to comfort.    Transfers Overall transfer level: Needs assistance Equipment used: Rolling walker (2 wheels) Transfers: Sit to/from Stand Sit to Stand: Min assist, +2 safety/equipment   Step pivot transfers: Mod assist, +2 physical assistance       General transfer comment: pt was able  to rise from recliner + 1 asisst this session but still with much effort and 75% VC's on proper hand placement to push up from nad "reach back" prior to sit to control desend.    Ambulation/Gait Ambulation/Gait assistance: Mod assist Gait Distance (Feet): 28 Feet (22 feet, 6  feet) Assistive device: Rolling walker (2 wheels) Gait Pattern/deviations: Step-through pattern, Decreased stride length, Knee flexed in stance - right, Knee flexed in stance - left, Decreased dorsiflexion - right, Decreased dorsiflexion - left, Trunk flexed Gait velocity: decreased     General Gait Details: Requires MAX encouragement and + 2 assist for safety such that recliner was following.  Slow progress.  50% VC's for proper walker to self distance as pt tends to push walker out too far.  Unsteady with turns.  Weak.  Tolerated amb 22 feet then needed a seated rest break.  Amb another 6 feet when B LE weakness set in.  Assisted back to bed.   Stairs             Wheelchair Mobility    Modified Rankin (Stroke Patients Only)       Balance                                            Cognition Arousal/Alertness: Awake/alert Behavior During Therapy: Flat affect Overall Cognitive Status: Within Functional Limits for tasks assessed                                 General Comments: AxO x 2 slightly groggy, slow to respond.  Did just swake from a nap.  Safety sitter in room.  Reported he only; ate a part of a banana today.        Exercises      General Comments        Pertinent Vitals/Pain Pain Assessment Pain Assessment: Faces Faces Pain Scale: Hurts a little bit Pain Location: R knee but not consitant Pain Descriptors / Indicators: Aching Pain Intervention(s): Monitored during session    Home Living                          Prior Function            PT Goals (current goals can now be found in the care plan section) Progress towards PT goals: Progressing toward goals    Frequency    Min 2X/week      PT Plan Current plan remains appropriate    Co-evaluation              AM-PAC PT "6 Clicks" Mobility   Outcome Measure  Help needed turning from your back to your side while in a flat bed without using  bedrails?: A Lot Help needed moving from lying on your back to sitting on the side of a flat bed without using bedrails?: A Lot Help needed moving to and from a bed to a chair (including a wheelchair)?: A Lot Help needed standing up from a chair using your arms (e.g., wheelchair or bedside chair)?: A Lot Help needed to walk in hospital room?: A Lot   6 Click Score: 10    End of Session Equipment Utilized During Treatment: Gait belt Activity Tolerance: Patient limited by fatigue Patient left: in bed;with call bell/phone within  reach;with bed alarm set;with family/visitor present Nurse Communication: Mobility status PT Visit Diagnosis: Difficulty in walking, not elsewhere classified (R26.2)     Time: 7471-5953 PT Time Calculation (min) (ACUTE ONLY): 25 min  Charges:  $Gait Training: 8-22 mins $Therapeutic Activity: 8-22 mins                     {Benelli Winther  PTA Acute  [employer] M-F          [phone] Weekend pager [phone]

## 2022-06-03 ENCOUNTER — Inpatient Hospital Stay (HOSPITAL_COMMUNITY): Payer: Medicare Other

## 2022-06-03 DIAGNOSIS — G9341 Metabolic encephalopathy: Secondary | ICD-10-CM | POA: Diagnosis not present

## 2022-06-03 LAB — COMPREHENSIVE METABOLIC PANEL
ALT: 37 U/L (ref 0–44)
AST: 51 U/L — ABNORMAL HIGH (ref 15–41)
Albumin: 2.4 g/dL — ABNORMAL LOW (ref 3.5–5.0)
Alkaline Phosphatase: 131 U/L — ABNORMAL HIGH (ref 38–126)
Anion gap: 3 — ABNORMAL LOW (ref 5–15)
BUN: 29 mg/dL — ABNORMAL HIGH (ref 8–23)
CO2: 25 mmol/L (ref 22–32)
Calcium: 8.1 mg/dL — ABNORMAL LOW (ref 8.9–10.3)
Chloride: 107 mmol/L (ref 98–111)
Creatinine, Ser: 0.9 mg/dL (ref 0.61–1.24)
GFR, Estimated: >60 mL/min (ref >60)
Glucose, Bld: 108 mg/dL — ABNORMAL HIGH (ref 70–99)
Potassium: 4.3 mmol/L (ref 3.5–5.1)
Sodium: 135 mmol/L (ref 135–145)
Total Bilirubin: 0.4 mg/dL (ref 0.3–1.2)
Total Protein: 5.9 g/dL — ABNORMAL LOW (ref 6.5–8.1)

## 2022-06-03 LAB — GLUCOSE, CAPILLARY
Glucose-Capillary: 100 mg/dL — ABNORMAL HIGH (ref 70–99)
Glucose-Capillary: 118 mg/dL — ABNORMAL HIGH (ref 70–99)

## 2022-06-03 MED ORDER — SODIUM CHLORIDE 0.9 % IV SOLN: INTRAVENOUS | Status: DC | PRN

## 2022-06-03 MED ORDER — TRAVASOL 10 % IV SOLN
INTRAVENOUS | Status: AC
  Filled 2022-06-03: qty 1312.5

## 2022-06-03 MED ORDER — MORPHINE SULFATE 15 MG PO TABS
15.0000 mg | ORAL_TABLET | ORAL | Status: DC | PRN
  Administered 2022-06-03 – 2022-06-04 (×3): 15 mg via ORAL
  Filled 2022-06-03 (×3): qty 1

## 2022-06-03 MED ORDER — HYDROMORPHONE HCL 1 MG/ML IJ SOLN
0.5000 mg | Freq: Three times a day (TID) | INTRAMUSCULAR | Status: DC | PRN
  Administered 2022-06-03 – 2022-06-04 (×2): 0.5 mg via INTRAVENOUS
  Filled 2022-06-03 (×2): qty 0.5

## 2022-06-03 MED ORDER — METHOCARBAMOL 500 MG PO TABS
250.0000 mg | ORAL_TABLET | Freq: Three times a day (TID) | ORAL | Status: DC
  Administered 2022-06-03 – 2022-06-04 (×3): 250 mg via ORAL
  Filled 2022-06-03 (×3): qty 1

## 2022-06-03 NOTE — Progress Notes (Signed)
PHARMACY - TOTAL PARENTERAL NUTRITION CONSULT NOTE   Indication: Crohn's disease and complex past surgical history including ex lap with small bowel resection, lysis of adhesion, mesh explantation, and diverting loop ileostomy in April 2023, on TPN prior to admission  Patient Measurements: Height: 5' 10" (177.8 cm) Weight: 68.1 kg (150 lb 1.6 oz) IBW/kg (Calculated) : 73 TPN AdjBW (KG): 68.1 Body mass index is 21.54 kg/m.  Assessment: Patient with PMH of Crohn's disease and complex surgical history as above on chronic TPN who presented on 05/13/22 with altered mental status after spouse found patient unresponsive with multiple pill bottles open around him. Patient was transported from SNF to ED the day prior for generalized weakness and acute worsening of his chronic abdominal pain. Patient decided to return home instead of his SNF. Pharmacy consulted to resume TPN as inpatient.   Discharge from [place] 04/19/22:  TPN pharmacist at [place]:  Estimated Nutrient Requirements: Protein: 1.5 - 1.7 g/kg/day (98 - 104 g/day), Total kcal: 30 - 35 kcal/kg/day (1830 - 1983 kcal/day)  TPN Formulation at [place] per TPN pharmacist: [A 6.6%, D 20%, F 3.1%] @ goal rate, 65 mL/hr (TV: 1560 mL/day) provides: Protein 1.7 g/kg/day 103 g/day, Total kcal 32 kcal/kg/day 1971 kcal/day  NPC: N ratio 94   Admitted at [place] 04/29/22-05/03/22: Nutritional Goals: TPN 80 mL/hr (provides 113 g of protein and 1943 kcals per day) RD Assessment: (10/16): 2100-2450kcal (30-35kcal/kg), Protein 105-130g (1.5-1.8g/kg) Utilize intralipid rather than SMOF as 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  [facility]: [name], PharmD, ph: [phone] 10/28 Most recent formula: Total volume 1560 mL:  Dextrose 220 g/day, Plenamine 120 g/day, SMOFlipid 50 g/day, NaCl 75 mEq, CaGluc 6 mEq, Potassium Ac 20 mEq, Potassium Phos 13.5 nM, Mag Sulfate 8 mEq, Vitamins/trace  elements  Glucose / Insulin: No hx DM.  - on sSSI (none required in the past 24 hrs) - cbgs (goal <150): wnl Electrolytes: lytes wnl including CorrCa which is 9.4  Renal: SCr ok; BUN slightly elevated at 29; UOP remains adequate, foley dc'd 11/16 Hepatic:  - AST/ALT slightly bumped - Alk phos remains elevated but improving  - Albumin low 2.4 - TG 47 (11/16) I/O: - 1863  mL recorded - ileostomy output 1215  ml/day - osmolality 298 slightly elevated on 11/10 - MIVF: dc'd 11/11 GI Imaging: - 11/6 abd [place] - normal liver (checked d/t elevated LFTs) - 10/27 CTa/p: no significant interval change since 10/14 CT. No evidence of bowel obstruction or other complicating feature at the anastomotic site or ostomy. - 10/14 CTa/p: previous surgical changes noted (transverse-sigmoid anastomosis) with stable inflammation since 10/12 CT GI Surgeries / Procedures: n/a  Central access: prior to admission - double lumen CVC TPN start date: started PTA  Nutritional Goals: TPN provides 131 g of protein and 2010 kcals per day.  RD Assessment this admission:    Estimated Needs Total Energy Estimated Needs: 2015-2350kcal Total Protein Estimated Needs: 100-135g Total Fluid Estimated Needs: 2450m (Holliday Segar)  Current Nutrition:  TPN Dysphagia-3 11/9 >>   Plan:   Cyclic TPN with 20737ml, change interval to 14 hr cycle GIR 2.73 - 5.45 Electrolytes in TPN:  Na 75 mEq/L K 45 mEq/L Ca 5 mEq/L Mg 4 mEq/L Phos 15 mmol/L Cl:Ac = 1:1 Add standard MVI and trace elements to TPN continue pepcid in TPN  With excellent CBGs, will limit finger sticks to twice daily - 2 hrs after  TPN start, 1 hour after TPN stop IVF dc'd by MD Monitor TPN labs on Mon/Thurs at minimum BMP, magnesium, phosphorus with AM labs    Royetta Asal, PharmD, BCPS 06/03/2022 10:01 AM

## 2022-06-03 NOTE — Plan of Care (Signed)
  Problem: Health Behavior/Discharge Planning: Goal: Ability to manage health-related needs will improve Outcome: Progressing   Problem: Nutrition: Goal: Adequate nutrition will be maintained Outcome: Progressing   Problem: Pain Managment: Goal: General experience of comfort will improve Outcome: Progressing

## 2022-06-03 NOTE — Progress Notes (Signed)
[facility] PROGRESS NOTE  Patient: [name] [id number]:[id number]   PCP: [name], MD DOB: [birth year]   DOA: 05/13/2022   DOS: 06/03/2022    Subjective: No nausea no vomiting no fever no chills.  Reporting pelvic pain primarily in his inner thigh area as well as in his gluteal area.  He feels this is a spasmodic pain.  Objective:   Vitals:   06/02/22 0605 06/02/22 1411 06/03/22 0147 06/03/22 1337  BP: (!) 121/52 (!) 117/58 (!) 126/50 (!) 110/52  Pulse: 80 85 86 79  Resp: 16 18 18 14   Temp: 98.5 F (36.9 C) 99 F (37.2 C) 99 F (37.2 C) 99.2 F (37.3 C)  TempSrc: Oral  Axillary   SpO2: 97% 97% 97% 95%  Weight:      Height:        S1-S2 present. Clear to auscultation. Bowel sound present. No edema. No significant abnormalities in the gluteal pelvic region area.  Assessment and plan: Active Issues Pelvic pain. X-ray hip and pelvis unremarkable for any acute abnormality. We will add scheduled Robaxin. Recommended patient to minimize use of IV Dilaudid. We will use MS IR for moderate and severe pain.  Suicidal ideation. Landscape architect.  Dysphagia. Currently tolerating oral regular diet. On chronic TPN.  Brief Hospital course [phone] with hx Crohn's with persistent abd pain s/p extensive surgeries and is currently TPN dependent. Pt presented with unresponsiveness in apparent suicide attempt via overdose. Pt remained unresponsive and later was unable to maintain airway, prompting intubation and transfer to ICU. Pt now extubated and care transferred to hospitalist.   Principal Problem:   Metabolic encephalopathy Active Problems:   Chronic anemia   Acute respiratory failure with hypoxia (HCC)   Severe malnutrition (HCC)   Generalized weakness   Drug overdose, intentional, initial encounter ([place])   Chronic pain syndrome   Aspiration pneumonia [facility])    Author: Berle Mull, MD [facility] 06/03/2022 4:25 PM   If 7PM-7AM, please contact  night-coverage at [email or web address]

## 2022-06-04 DIAGNOSIS — J9601 Acute respiratory failure with hypoxia: Secondary | ICD-10-CM | POA: Diagnosis not present

## 2022-06-04 DIAGNOSIS — E43 Unspecified severe protein-calorie malnutrition: Secondary | ICD-10-CM | POA: Diagnosis not present

## 2022-06-04 DIAGNOSIS — G9341 Metabolic encephalopathy: Secondary | ICD-10-CM | POA: Diagnosis not present

## 2022-06-04 DIAGNOSIS — D649 Anemia, unspecified: Secondary | ICD-10-CM | POA: Diagnosis not present

## 2022-06-04 LAB — BASIC METABOLIC PANEL
Anion gap: 5 (ref 5–15)
BUN: 28 mg/dL — ABNORMAL HIGH (ref 8–23)
CO2: 24 mmol/L (ref 22–32)
Calcium: 7.9 mg/dL — ABNORMAL LOW (ref 8.9–10.3)
Chloride: 107 mmol/L (ref 98–111)
Creatinine, Ser: 0.97 mg/dL (ref 0.61–1.24)
GFR, Estimated: >60 mL/min (ref >60)
Glucose, Bld: 108 mg/dL — ABNORMAL HIGH (ref 70–99)
Potassium: 4 mmol/L (ref 3.5–5.1)
Sodium: 136 mmol/L (ref 135–145)

## 2022-06-04 LAB — MAGNESIUM: Magnesium: 1.8 mg/dL (ref 1.7–2.4)

## 2022-06-04 LAB — PHOSPHORUS: Phosphorus: 3 mg/dL (ref 2.5–4.6)

## 2022-06-04 LAB — GLUCOSE, CAPILLARY
Glucose-Capillary: 115 mg/dL — ABNORMAL HIGH (ref 70–99)
Glucose-Capillary: 122 mg/dL — ABNORMAL HIGH (ref 70–99)

## 2022-06-04 MED ORDER — TRAVASOL 10 % IV SOLN
INTRAVENOUS | Status: AC
  Filled 2022-06-04: qty 1312.5

## 2022-06-04 MED ORDER — METHOCARBAMOL 500 MG PO TABS
500.0000 mg | ORAL_TABLET | Freq: Three times a day (TID) | ORAL | Status: DC
  Administered 2022-06-04 – 2022-06-21 (×50): 500 mg via ORAL
  Filled 2022-06-04 (×50): qty 1

## 2022-06-04 MED ORDER — MORPHINE SULFATE 15 MG PO TABS: 15.0000 mg | ORAL_TABLET | ORAL | Status: DC | PRN

## 2022-06-04 MED ORDER — SULFAMETHOXAZOLE-TRIMETHOPRIM 800-160 MG PO TABS
1.0000 | ORAL_TABLET | Freq: Two times a day (BID) | ORAL | Status: AC
  Administered 2022-06-04 – 2022-06-08 (×8): 1 via ORAL
  Filled 2022-06-04 (×8): qty 1

## 2022-06-04 MED ORDER — MORPHINE SULFATE 15 MG PO TABS
15.0000 mg | ORAL_TABLET | ORAL | Status: DC | PRN
  Administered 2022-06-04 – 2022-06-23 (×56): 15 mg via ORAL
  Filled 2022-06-04 (×58): qty 1

## 2022-06-04 NOTE — Progress Notes (Signed)
[facility] PROGRESS NOTE  Patient: [name] [id number]   PCP: [name], MD DOB: 1950/09/25   DOA: 05/13/2022   DOS: 06/04/2022    Subjective: Continues to report ' pelvic floor pain' no nausea no vomiting.  [facility].  No other acute complaint.  Objective:   Vitals:   06/03/22 1337 06/03/22 2018 06/04/22 0653 06/04/22 1328  BP: (!) 110/52 119/61 (!) 101/58 (!) 110/55  Pulse: 79 83 74 69  Resp: 14 19 18 18   Temp: 99.2 F (37.3 C) 98.6 F (37 C) 98.4 F (36.9 C) 98 F (36.7 C)  TempSrc:   Oral Oral  SpO2: 95% 95% 96% 96%  Weight:      Height:        S1 and 2 present.  Clear to auscultation.  Bowel sound present.  Assessment and plan: Active Issues Pelvic pain. Based on the x-ray being unremarkable currently I will discontinue IV Dilaudid. I will use as needed MS IR every 3 hours. Increase the dose of the Robaxin that was initiated yesterday from 250 mg to 500 mg and continue as scheduled. Tells me that he was in the process of seeing a urologist take care of his pelvic floor pain.  Prostatitis. Continue Bactrim for a total of 14 days starting from 11/9.  BPH. Continue Flomax continue finasteride.  Suicidal ideation. Landscape architect.  COVID exposure. Currently no symptoms.  No indication for testing.  Ileostomy Intermittently case. Discussed with patient that at present it does not appear that he requires surgical consultation. Ileostomy/WOC RN evaluated the patient on 11/17 and have placed recommendation. Explained that currently there is no indication for me to initiate a transfer [facility]. Wife verbalized understanding.  Brief Hospital course [phone] with hx Crohn's with persistent abd pain s/p extensive surgeries and is currently TPN dependent. Pt presented with unresponsiveness in apparent suicide attempt via overdose. Pt remained unresponsive and later was  unable to maintain airway, prompting intubation and transfer to ICU. Pt now extubated and care transferred to hospitalist.   Principal Problem:   Metabolic encephalopathy Active Problems:   Chronic anemia   Acute respiratory failure with hypoxia (HCC)   Severe malnutrition (HCC)   Generalized weakness   Drug overdose, intentional, initial encounter ([place])   Chronic pain syndrome   Aspiration pneumonia [facility])    Author: Berle Mull, MD [facility] 06/04/2022 7:36 PM   If 7PM-7AM, please contact night-coverage at [email or web address]

## 2022-06-04 NOTE — Progress Notes (Signed)
PHARMACY - TOTAL PARENTERAL NUTRITION CONSULT NOTE   Indication: Crohn's disease and complex past surgical history including ex lap with small bowel resection, lysis of adhesion, mesh explantation, and diverting loop ileostomy in April 2023, on TPN prior to admission  Patient Measurements: Height: 5' 10" (177.8 cm) Weight: 68.1 kg (150 lb 1.6 oz) IBW/kg (Calculated) : 73 TPN AdjBW (KG): 68.1 Body mass index is 21.54 kg/m.  Assessment: Patient with PMH of Crohn's disease and complex surgical history as above on chronic TPN who presented on 05/13/22 with altered mental status after spouse found patient unresponsive with multiple pill bottles open around him. Patient was transported from SNF to ED the day prior for generalized weakness and acute worsening of his chronic abdominal pain. Patient decided to return home instead of his SNF. Pharmacy consulted to resume TPN as inpatient.   Discharge from [facility] 04/19/22:  TPN pharmacist at [facility]:  Estimated Nutrient Requirements: Protein: 1.5 - 1.7 g/kg/day (98 - 104 g/day), Total kcal: 30 - 35 kcal/kg/day (1830 - 1983 kcal/day)  TPN Formulation at [facility] per TPN pharmacist: [A 6.6%, D 20%, F 3.1%] @ goal rate, 65 mL/hr (TV: 1560 mL/day) provides: Protein 1.7 g/kg/day 103 g/day, Total kcal 32 kcal/kg/day 1971 kcal/day  NPC: N ratio 94   Admitted at [place] 04/29/22-05/03/22: Nutritional Goals: TPN 80 mL/hr (provides 113 g of protein and 1943 kcals per day) RD Assessment: (10/16): 2100-2450kcal (30-35kcal/kg), Protein 105-130g (1.5-1.8g/kg) Utilize intralipid rather than SMOF as 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  [facility]: Alan Knight, PharmD, ph: [phone] 10/28 Most recent formula: Total volume 1560 mL:  Dextrose 220 g/day, Plenamine 120 g/day, SMOFlipid 50 g/day, NaCl 75 mEq, CaGluc 6 mEq, Potassium Ac 20 mEq, Potassium Phos 13.5 nM, Mag Sulfate 8 mEq, Vitamins/trace  elements  Glucose / Insulin: No hx DM.  - on sSSI (none required in the past 24 hrs) - cbgs (goal <150): wnl Electrolytes: lytes wnl including CorrCa which is 9.2  Renal: SCr ok; BUN slightly elevated at 29; UOP remains adequate, foley dc'd 11/16 Hepatic:  - AST/ALT slightly bumped - Alk phos remains elevated but improving  - Albumin low 2.4 - TG 47 (11/16) I/O: (+) 893 mL recorded - ileostomy output 1000  ml/day - osmolality 298 slightly elevated on 11/10 - MIVF: dc'd 11/11 GI Imaging: - 11/6 abd US - normal liver (checked d/t elevated LFTs) - 10/27 CTa/p: no significant interval change since 10/14 CT. No evidence of bowel obstruction or other complicating feature at the anastomotic site or ostomy. - 10/14 CTa/p: previous surgical changes noted (transverse-sigmoid anastomosis) with stable inflammation since 10/12 CT GI Surgeries / Procedures: n/a  Central access: prior to admission - double lumen CVC TPN start date: started PTA  Nutritional Goals: TPN provides 131 g of protein and 2010 kcals per day.  RD Assessment this admission:    Estimated Needs Total Energy Estimated Needs: 2015-2350kcal Total Protein Estimated Needs: 100-135g Total Fluid Estimated Needs: 2400mL (Holliday Segar)  Current Nutrition:  TPN Dysphagia-3 11/9 >>   Plan:   Cyclic TPN with 2100 ml, change interval to 14 hr cycle GIR 2.73 - 5.45 Electrolytes in TPN:  Na 75 mEq/L K 45 mEq/L Ca 5 mEq/L Mg 5 mEq/L Phos 15 mmol/L Cl:Ac = 1:1 Add standard MVI and trace elements to TPN continue pepcid in TPN  With excellent CBGs, will limit finger sticks to twice daily - 2 hrs after TPN   start, 1 hour after TPN stop IVF dc'd by MD Monitor TPN labs on Mon/Thurs at minimum     , PharmD, BCPS 06/04/2022 11:02 AM  

## 2022-06-05 ENCOUNTER — Inpatient Hospital Stay (HOSPITAL_COMMUNITY): Payer: Medicare Other

## 2022-06-05 DIAGNOSIS — G9341 Metabolic encephalopathy: Secondary | ICD-10-CM | POA: Diagnosis not present

## 2022-06-05 DIAGNOSIS — T1491XA Suicide attempt, initial encounter: Secondary | ICD-10-CM | POA: Diagnosis not present

## 2022-06-05 DIAGNOSIS — R401 Stupor: Secondary | ICD-10-CM | POA: Diagnosis not present

## 2022-06-05 LAB — COMPREHENSIVE METABOLIC PANEL
ALT: 38 U/L (ref 0–44)
AST: 49 U/L — ABNORMAL HIGH (ref 15–41)
Albumin: 2.5 g/dL — ABNORMAL LOW (ref 3.5–5.0)
Alkaline Phosphatase: 127 U/L — ABNORMAL HIGH (ref 38–126)
Anion gap: 6 (ref 5–15)
BUN: 24 mg/dL — ABNORMAL HIGH (ref 8–23)
CO2: 25 mmol/L (ref 22–32)
Calcium: 8 mg/dL — ABNORMAL LOW (ref 8.9–10.3)
Chloride: 108 mmol/L (ref 98–111)
Creatinine, Ser: 0.77 mg/dL (ref 0.61–1.24)
GFR, Estimated: >60 mL/min (ref >60)
Glucose, Bld: 116 mg/dL — ABNORMAL HIGH (ref 70–99)
Potassium: 4 mmol/L (ref 3.5–5.1)
Sodium: 139 mmol/L (ref 135–145)
Total Bilirubin: 0.3 mg/dL (ref 0.3–1.2)
Total Protein: 6 g/dL — ABNORMAL LOW (ref 6.5–8.1)

## 2022-06-05 LAB — GLUCOSE, CAPILLARY
Glucose-Capillary: 83 mg/dL (ref 70–99)
Glucose-Capillary: 100 mg/dL — ABNORMAL HIGH (ref 70–99)

## 2022-06-05 LAB — TRIGLYCERIDES: Triglycerides: 84 mg/dL (ref <150)

## 2022-06-05 LAB — MAGNESIUM: Magnesium: 2 mg/dL (ref 1.7–2.4)

## 2022-06-05 LAB — PHOSPHORUS: Phosphorus: 2.8 mg/dL (ref 2.5–4.6)

## 2022-06-05 MED ORDER — IOHEXOL 300 MG/ML  SOLN
100.0000 mL | Freq: Once | INTRAMUSCULAR | Status: AC | PRN
  Administered 2022-06-05: 100 mL via INTRAVENOUS

## 2022-06-05 MED ORDER — TRAVASOL 10 % IV SOLN
INTRAVENOUS | Status: AC
  Filled 2022-06-05: qty 1312.5

## 2022-06-05 MED ORDER — IOHEXOL 9 MG/ML PO SOLN
ORAL | Status: AC
  Administered 2022-06-05: 500 mL
  Filled 2022-06-05: qty 1000

## 2022-06-05 NOTE — Progress Notes (Signed)
PHARMACY - TOTAL PARENTERAL NUTRITION CONSULT NOTE   Indication: Crohn's disease and complex past surgical history including ex lap with small bowel resection, lysis of adhesion, mesh explantation, and diverting loop ileostomy in April 2023, on TPN prior to admission  Patient Measurements: Height: _0  (177.8 cm) Weight: 68.1 kg (150 lb 1.6 oz) IBW/kg (Calculated) : 73 TPN AdjBW (KG): 68.1 Body mass index is 21.54 kg/m.  Assessment: Patient with PMH of Crohn's disease and complex surgical history as above on chronic TPN who presented on 05/13/22 with altered mental status after spouse found patient unresponsive with multiple pill bottles open around him. Patient was transported from SNF to ED the day prior for generalized weakness and acute worsening of his chronic abdominal pain. Patient decided to return home instead of his SNF. Pharmacy consulted to resume TPN as inpatient.   Discharge from [place] 04/19/22:  TPN pharmacist at [place]:  Estimated Nutrient Requirements: Protein: 1.5 - 1.7 g/kg/day (98 - 104 g/day), Total kcal: 30 - 35 kcal/kg/day (1830 - 1983 kcal/day)  TPN Formulation at [place] per TPN pharmacist: [A 6.6%, D 20%, F 3.1%] @ goal rate, 65 mL/hr (TV: 1560 mL/day) provides: Protein 1.7 g/kg/day 103 g/day, Total kcal 32 kcal/kg/day 1971 kcal/day  NPC: N ratio 94   Admitted at [place] 04/29/22-05/03/22: Nutritional Goals: TPN 80 mL/hr (provides 113 g of protein and 1943 kcals per day) RD Assessment: (10/16): 2100-2450kcal (30-35kcal/kg), Protein 105-130g (1.5-1.8g/kg) Utilize intralipid rather than SMOF as 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  [facility]: [name], PharmD, ph: [phone] 10/28 Most recent formula: Total volume 1560 mL:  Dextrose 220 g/day, Plenamine 120 g/day, SMOFlipid 50 g/day, NaCl 75 mEq, CaGluc 6 mEq, Potassium Ac 20 mEq, Potassium Phos 13.5 nM, Mag Sulfate 8 mEq, Vitamins/trace  elements  Glucose / Insulin: No hx DM.  CBGs at goal <150. Electrolytes: lytes wnl including CorrCa which is 9.2  Note Bactrim started on 11/19, continue to monitor electrolytes.  Renal: SCr ok; BUN slightly elevated at 24; UOP remains adequate Hepatic:  - AST slightly bumped; ALT wnl, Alk phos remains elevated but improving  - Albumin low 2.5 - TG 84 (11/20) I/O: (+) 209 mL recorded - ileostomy output 1000  ml/day - osmolality 298 slightly elevated on 11/10 GI Imaging: - 11/6 abd [place] - normal liver (checked d/t elevated LFTs) - 10/27 CTa/p: no significant interval change since 10/14 CT. No evidence of bowel obstruction or other complicating feature at the anastomotic site or ostomy. - 10/14 CTa/p: previous surgical changes noted (transverse-sigmoid anastomosis) with stable inflammation since 10/12 CT GI Surgeries / Procedures: n/a  Central access: prior to admission - double lumen CVC TPN start date: started PTA  Nutritional Goals: TPN provides 131 g of protein and 2010 kcals per day.  RD Assessment this admission:    Estimated Needs Total Energy Estimated Needs: 2015-2350kcal Total Protein Estimated Needs: 100-135g Total Fluid Estimated Needs: 2447m (Holliday Segar)  Current Nutrition:  TPN Dysphagia-3 11/9 >> Regular 176/22>>   Plan:  Cyclic TPN with 26333ml over 14 hrs GIR 2.73 - 5.45  Electrolytes in TPN:  Na 75 mEq/L K 45 mEq/L Ca 5 mEq/L Mg 5 mEq/L Phos 15 mmol/L Cl:Ac = 1:1 Add standard MVI and trace elements to TPN Continue Pepcid in TPN  With excellent CBGs, will limit finger sticks to twice daily - 2 hrs after TPN start, 1 hour after TPN stop  IVF dc'd by MD Monitor TPN labs on Mon/Thurs at minimum  [facility] PharmD, BCPS [facility] main pharmacy [phone] 06/05/2022 7:33 AM

## 2022-06-05 NOTE — Progress Notes (Signed)
[facility] Progress Note Patient: [name] XHB:[id number] DOB: Sep 24, 1950 DOA: 05/13/2022  DOS: the patient was seen and examined on 06/05/2022  Brief hospital course: 71yo with hx Crohn's with persistent abd pain s/p extensive surgeries and is currently TPN dependent. Pt presented with unresponsiveness in apparent suicide attempt via overdose. Pt remained unresponsive and later was unable to maintain airway, prompting intubation and transfer to ICU. Pt now extubated and care transferred to hospitalist. Assessment and Plan: Intentional drug overdose. Suicide ideation. MDD. Patient presented with an unresponsive event at home. Noted to have intentionally taken pain medication and anxiolytics with intention for suicide. Psychiatry was consulted. Recommended inpatient psychiatric admission. Patient currently medically stable although unable to find a place that can accommodate TPN. Referred to [place].  Accepted.  Awaiting a bed   Acute toxic and metabolic encephalopathy. Likely combination of medication overdose as well as hypoxia from pneumonia. Head CT and MRI brain negative for any acute abnormality. Mentation improving. Monitor.   Aspiration pneumonia. Dysphagia. Completed IV antibiotic therapy.   Dysphagia. Speech therapy following the patient. MBS was performed and patient is currently on dysphagia 3 diet. Tolerating it well. Monitor.   Crohn's disease. Patient had multiple surgeries in the past.currently not on any medical therapy. 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 Management per pharmacy.   Severe protein calorie malnutrition. Body mass index is 21.54 kg/m.  Nutrition Problem: Severe Malnutrition Etiology: chronic illness Interventions: TPN (increase nutrients) Continue the same.   Chronic prostatitis. Bladder calculi. BPH. Seen by [name] on 10/15. Patient is on Bactrim for 28 days since then although missed some  doses while he was intubated. Currently based on my discussion with urology on 11/15 we will continue total 14-day treatment course for Bactrim. Patient had a Foley catheter placed in the ICU most likely due to critical condition. Patient is on Flomax also. Foley removed on 11/16.  Patient able to void after the removal. Discontinue oxybutynin as that can cause retention and increased PVR. Start on finasteride as well. Patient will follow-up with [name] for outpatient procedure for BPH.   Ileostomy leakage with irritation and dermatitis.. Wound care consulted. Currently being treated with stoma powder and frequent emptying. Stoma appears healthy and pink.  No surrounding skin cellulitis seen.  Patient is [name].  Patient had a PICC line placed in September 24 with the plan to continue TPN until the patient gains 30 to 40 pounds and then consider ileostomy closure. Patient was 130 pound at the placement of the PICC line. Photograph below taken on 06/05/2022.Marland Kitchen    COVID exposure. Patient's wife and son both got tested positive on 11/15 for COVID-19. Family has been visiting the patient throughout the hospital stay. COVID-19 negative.  Currently no indication for isolation. We will perform a recheck of COVID-19 test on 11/19.   Chronic pelvic pain. Patient continues to report worsening of his pelvic pain. X-ray hip and pelvis is unremarkable for any acute etiology. Patient had a CT abdomen and pelvis as well as CT chest angio abdomen and pelvis earlier last month during this hospital stay which were unremarkable. Patient is requiring IV Dilaudid. Patient reported to the same to psychiatry that the pain is actually causing him anxiety. We will perform another CT abdomen and pelvis with contrast due to patient's complaint of nausea as well as pelvic pain. If this is unremarkable.  No further work-up is necessary. Patient was informed that he does  not  require any IV narcotics as there is no acute fracture or ruptured viscus or any other acute abnormality. Patient told me that discussion with regards to transitioning from narcotics to other kind of medicine was associated with his suicidal attempt.   Transient A-fib. Resolved. Monitor.   Normocytic anemia.  Iron deficiency.  Relative vitamin B12 deficiency Baseline hemoglobin around 10. Currently hemoglobin around 8.  Iron level 18.  Will provide iron supplementation. Suspect this is from frequent blood draws and poor p.o. intake and nutritional status. Vitamin B12 301.  Continue with replacement.   Subjective: Appears anxious with regards to his pelvic pain.  Reports nausea.  No vomiting.  Appears anxious with regards to his ileostomy as well.  Physical Exam: General: in mild distress;  Cardiovascular: S1 and S2 Present, no Murmur Respiratory: no respiratory effort, Bilateral Air entry present, no Crackles, no wheezes Abdomen: Bowel Sound present, Non tender  Extremities: no edema Neurology: alert and oriented to time, place, and person   Data Reviewed: I have Reviewed nursing notes, Vitals, and Lab results. Since last encounter, pertinent lab results magnesium and BMP   . I have ordered test including CBC and BMP  . I have discussed pt's care plan and test results with psychiatric  . I have ordered imaging CT abdomen and pelvis with contrast  .   Disposition: Status is: Inpatient Remains inpatient appropriate because: Unsafe discharge.  Need placement to [place].  enoxaparin (LOVENOX) injection 40 mg Start: 05/13/22 2200   Family Communication: Wife at bedside Level of care: Med-Surg  Vitals:   06/04/22 1328 06/04/22 1951 06/05/22 0608 06/05/22 1447  BP: (!) 110/55 (!) 110/52 (!) 103/47 112/62  Pulse: 69 72 70 72  Resp: 18 18 18 17   Temp: 98 F (36.7 C) 97.7 F (36.5 C) 97.8 F (36.6 C) 98.3 F (36.8 C)  TempSrc: Oral Axillary Axillary   SpO2: 96% 97% 97% 96%   Weight:      Height:         Author: Berle Mull, MD 06/05/2022 7:41 PM  Please look on [email or web address] to find out who is on call.

## 2022-06-05 NOTE — Progress Notes (Signed)
Nutrition Follow-up  DOCUMENTATION CODES:   Severe malnutrition in context of chronic illness  INTERVENTION:  -Continue current cyclic TPN 1 hour taper up/down for total x14 hour run time: 131.25g protein (1.9g/kg), 288.75g dextrose (GIR 2.72-5.45mg/kg/min), 50.4g lipids (25% calories) and 2011kcal (29.5kcal/kg) in 2133m fluid. -Continue Regular per SLP recommendations. Low fiber food choices recommended.   NUTRITION DIAGNOSIS:   Severe Malnutrition related to chronic illness as evidenced by percent weight loss, severe fat depletion, severe muscle depletion. *ongoing  GOAL:   Patient will meet greater than or equal to 90% of their needs *being met with TPN  MONITOR:   Diet advancement, Labs, Other (Comment) (TPN)  REASON FOR ASSESSMENT:   Consult New TPN/TNA  ASSESSMENT:   Pt is a 70yoM with PMH of Crohn's disease resulting in a complicated surgical history followed at [facility] to include left hemicolectomy and ileostomy with multiple other complications to include TPN dependence, chronic prostatitis, SVT, and asthma who presents with generalized weakness, acute worsening of his chronic abdominal pain and AMS.  11/17 diet adv to Regular  Patient's diet advanced to regular and patient noted to be eating well since that time.  He remains on goal cyclic TPN which meets estimated nutrient needs. Labs reviewed. Alk Phos and AST elevated but stable the past 3 days. Triglycerides and Creatinine WNL. Electrolytes WNL.  Documented meal intakes: 11/17 lunch: 45% 11/18 dinner: 75% 11/19 breakfast: 50%  Lunch: 75%  Dinner: 100% 11/20 breakfast: 25%  Lunch 75%   Medications reviewed and include: Vitamin B12, Zofran prn  Labs reviewed:  AST 49 Alk Phos 127   Diet Order:   Diet Order             Diet regular Room service appropriate? Yes with Assist; Fluid consistency: Thin  Diet effective now                   EDUCATION NEEDS:   Education needs have been  addressed  Skin:  Skin Assessment: Skin Integrity Issues: Skin Integrity Issues:: Other (Comment) Other: incontinence associated dermatitis to mid sacrum  Last BM:  ileostomy  Height:  Ht Readings from Last 1 Encounters:  05/26/22 _0  (1.778 m)   Weight:  Wt Readings from Last 1 Encounters:  05/26/22 68.1 kg    BMI:  Body mass index is 21.54 kg/m.  Estimated Nutritional Needs:  Kcal:  2015-2350kcal Protein:  100-135g Fluid:  24077m(Holliday Segar)    AsSamson FredericD, LDN For contact information, refer to [facility]

## 2022-06-05 NOTE — Progress Notes (Signed)
Physical Therapy Treatment Patient Details Name: [name] MRN: [id number] DOB: 08-14-1950 Today's Date: 06/05/2022   History of Present Illness 71 year old male with complicated past medical history as outlined below, which is significant for Crohn's disease with persistent abdominal pain.  On April of this year he underwent exploratory laparotomy with small bowel resection, repair of enterotomy, and creation of a diverting loop ileostomy at [facility].  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  He has been residing in [facility] from where he presented to the emergency department on 10/27 for pain at the site of his ostomy with foul-smelling discharge.  Work-up of the ostomy and abdomen including CT imaging was unremarkable.  He was discharged to SNF, however, rather than returning to SNF he found private transport to his home.Then on 10/28 he again presented to [facility] long emergency department with chief complaint of unresponsiveness.11/2 reveals that ingestion of pills at home was in fact a suicide attempt. 1:1 sitter, psych consult    PT Comments    General Comments: AxO x 2 improved cognition but not yet 100% prior level.  Unable to recall recent past events and poor self knowledge of his current medical status. Pt was OOB in recliner with Spouse visiting.  Assisted with amb in hallway.  General transfer comment: 75% VC's on proper hand placment to push self up as well as reach back prior to sit.  Unsteady with turns.  Requires 50% assist to navigate walker correctly.  Increased unsteadiness with turns and back steps.  Poor self coorection to midline. Slowly improving but still  HIGH FALL RISK. General Gait Details: slight improvement with stepping heel to toe and alternating with an equal stance.  Noted increased knee flexion and decreased shuffled gait pattern.  BOS remains narrow and step/stride length is short.  Pt also required 50% asisst to navigate walker correctly esp with  turns.  Posture is poor/forward flexed and balance remains impaired.  Poor spacial awareness and difficulty self correcting to midline.  Tolerated amb 30 feet x 2 with one seated rest break.  C/O mild dizziness.  BP is soft 118/66 (81) No drastic deviation from prior BP's.  Slowly improving.  Remains a HIGH FALL RISK. D/C plans are difficult as pt requires TPN, skilled Nursing and Phych  Recommendations for follow up therapy are one component of a multi-disciplinary discharge planning process, led by the attending physician.  Recommendations may be updated based on patient status, additional functional criteria and insurance authorization.  Follow Up Recommendations  Skilled nursing-short term rehab (<3 hours/day)     Assistance Recommended at Discharge Frequent or constant Supervision/Assistance  Patient can return home with the following A lot of help with walking and/or transfers;A lot of help with bathing/dressing/bathroom;Assist for transportation;Help with stairs or ramp for entrance;Direct supervision/assist for medications management;Assistance with cooking/housework   Equipment Recommendations  None recommended by PT    Recommendations for Other Services       Precautions / Restrictions Precautions Precautions: Fall Precaution Comments: ostomy     Mobility  Bed Mobility Overal bed mobility: Needs Assistance Bed Mobility: Sit to Supine       Sit to supine: Supervision, Min guard   General bed mobility comments: increased self ability to transition back to bed.    Transfers Overall transfer level: Needs assistance Equipment used: Rolling walker (2 wheels) Transfers: Sit to/from Stand Sit to Stand: Min assist, Mod assist           General transfer comment:  75% VC's on proper hand placment to push self up as well as reach back prior to sit.  Unsteady with turns.  Requires 50% assist to navigate walker correctly.  Increased unsteadiness with turns and back steps.   Poor self coorection to midline. Slowly improving but still  HIGH FALL RISK.    Ambulation/Gait Ambulation/Gait assistance: Min assist, Mod assist Gait Distance (Feet): 60 Feet (30 feet x 2 one seated rest break) Assistive device: Rolling walker (2 wheels) Gait Pattern/deviations: Step-through pattern, Decreased stride length, Knee flexed in stance - right, Knee flexed in stance - left, Decreased dorsiflexion - right, Decreased dorsiflexion - left, Trunk flexed Gait velocity: decreased     General Gait Details: slight improvement with stepping heel to toe and alternating with an equal stance.  Noted increased knee flexion and decreased shuffled gait pattern.  BOS remains narrow and step/stride length is short.  Pt also required 50% asisst to navigate walker correctly esp with turns.  Posture is poor/forward flexed and balance remains impaired.  Poor spacial awareness and difficulty self correcting to midline.  Tolerated amb 30 feet x 2 with one seated rest break.  C/O mild dizziness.  BP is soft 118/66 (81) No drastic deviation from prior BP's.  Slowly improving.  Remains a HIGH FALL RISK.   Stairs             Wheelchair Mobility    Modified Rankin (Stroke Patients Only)       Balance                                            Cognition Arousal/Alertness: Awake/alert Behavior During Therapy: WFL for tasks assessed/performed Overall Cognitive Status: Within Functional Limits for tasks assessed                                 General Comments: AxO x 2 improved cognition but not yet 100% prior level.  Unable to recall recent past events and poor self knowledge of his current medical status.        Exercises      General Comments        Pertinent Vitals/Pain Pain Assessment Pain Assessment: No/denies pain    Home Living                          Prior Function            PT Goals (current goals can now be found in the  care plan section) Progress towards PT goals: Progressing toward goals    Frequency           PT Plan Current plan remains appropriate    Co-evaluation              AM-PAC PT "6 Clicks" Mobility   Outcome Measure  Help needed turning from your back to your side while in a flat bed without using bedrails?: A Lot Help needed moving from lying on your back to sitting on the side of a flat bed without using bedrails?: A Lot Help needed moving to and from a bed to a chair (including a wheelchair)?: A Lot Help needed standing up from a chair using your arms (e.g., wheelchair or bedside chair)?: A Lot Help needed to walk in hospital room?: A  Lot Help needed climbing 3-5 steps with a railing? : A Lot 6 Click Score: 12    End of Session Equipment Utilized During Treatment: Gait belt Activity Tolerance: Patient limited by fatigue Patient left: in bed;with call bell/phone within reach;with bed alarm set;with family/visitor present Nurse Communication: Mobility status PT Visit Diagnosis: Difficulty in walking, not elsewhere classified (R26.2)     Time: 4997-1820 PT Time Calculation (min) (ACUTE ONLY): 21 min  Charges:  $Gait Training: 8-22 mins                     Rica Koyanagi  PTA [place] Office M-F          [phone] Weekend pager [phone]

## 2022-06-05 NOTE — Progress Notes (Signed)
PIV consult for radiology. Confirmed with RN and radiology that central line is power injectable.

## 2022-06-05 NOTE — Consult Note (Signed)
Face-to-Face Psychiatry Consult   Reason for Consult:  Suicide Attempt  Referring Physician:  Red Christians   Patient Identification: [name] MRN:  [id number] Principal Diagnosis: Metabolic encephalopathy Diagnosis:  Principal Problem:   Metabolic encephalopathy Active Problems:   Chronic anemia   Acute respiratory failure with hypoxia (HCC)   Severe malnutrition (HCC)   Generalized weakness   Drug overdose, intentional, initial encounter ([place])   Chronic pain syndrome   Aspiration pneumonia ([place])   Total Time spent with patient: 20 minutes  Subjective:   71yo with hx Crohn's with persistent abd pain s/p extensive surgeries and is currently TPN dependent. Pt presented with unresponsiveness in apparent suicide attempt via overdose.    Patient seen and assessed, chart reviewed, and consulted with attending psychiatrist and primary team.  Patient endorses tolerating Zoloft 100 mg p.o. daily well at this time.  He reports improvement in symptoms to include reduction in depression and improved sleep.  He has been compliant with medications thus far, no side effects and/or adverse reactions noted.  He does continue to endorse increase in anxiety, currently rating it an 8 out of 10 with 10 being the worst on a Licart scale.  He contributes his increase in anxiety to his worsening pelvic floor pain.  Patient states that his pain is managed better during the day, and is only worse at night.  Did discuss with patient with advised primary team of his concerns.  When rating patient's depression score, he was unable to identify and provide a number for his level of depression.  He is observed to seek reassurance and opinions from his safety sitter, and family members who are present at the bedside.  His safety sitter reported his depression to be about a 3 or 4 out of 10 with 10 being the worst on a Licart scale.  Patient surprisingly did open up today to discuss his contributing factors to his  worsening depression and anxiety over the past year.  He reports multiple surgeries, surgical resection, ileostomy, increased pain, decrease in autonomy that led to hopelessness and despair.  He does become tearful several times throughout today's psychiatric reevaluation, citing he had no other choice as he felt it was his only way out.  Patient further reports" while I want to go home, I cannot go back home this way.  Where do this end?  Where while I ended up if I go back home?  The same place as I was before out of options that resulted in the attempt."  While he currently denies active suicidal ideation and passive suicidal ideations, patient does continue to identify multiple risk factors for suicide attempt, although he states a repeat attempt is not likely.  Patient was able to identify coping skills to utilize during hospitalization to reduce anxiety and depression; this includes going outside, music, dancing with safety sitter.     With his family in agreement we did review several options to include current recommendation plan ongoing referral to [facility]."  I am not going to any state hospital.  I am concerned about security."  In which his wife adds to " I am concerned about his safety.  That place almost lost their funding several times and 1 of those reasons was for security."  Patient then inquired about intensive outpatient and discharging home.  In which we reviewed home and is not considered a safe disposition at this time for several reasons.  His wife again interjected, it is noted that  she spoke very condescending to this provider.  Patient then inquired about having intensive outpatient services while admitted to inpatient facility such as [facility].  Patient is advised that is currently not an option.  He then inquires about private pay, insurance, and providing services from a third party that can provide psychiatric services while hospitalized.  Patient is encouraged  to check with his insurance, and or contact private pay facilities did discuss need for hospital privileges and access.  We did also review possible transfer to a tertiary care center, however patient has no medical reason and or need to transfer to another facility at this time.  Writer did attempt to answer all questions, comments, and concerns from patient, wife, and sister.  Sister did inquire about pelvic floor therapist.   Psychiatry consult service will continue to follow at this time.  Recommendation will be for patient to continue inpatient psychiatric services, ongoing referral to [facility] has been placed.  Patient is currently voluntary, however in the event he attempts to leave the hospital he will need to be placed under involuntary commitment as he remains a danger to his self at this time of the evaluation.  Safety Assessment: Individualized risk factors include: age, caucasian , male gender and  previous suicide attempt, ongoing depression and anxiety, serious illness such as chronic pain, sense of hopelessness.. Individualized protective factors include: Patient has treatable psychiatric disorders and symptoms, No access to firearms or other means of harm, Future oriented, Reality-based and No history of head injury. Taking the aforementioned non-modifiable and modifiable risk factors in conjunction with his protective factors, the patient is currently felt to be of moderate to high imminent risk of harm to self. To further mitigate risk, please see the below treatment recommendations.    HPI: Currently on interview, the patient. Alert and oriented x4. Patient reports presenting to the emergency department after a suicide attempt via after taking multiple pills in an attempt to end his life. Over the past couple months he reports worsening depression and anxiety. He reports symptoms of depression including hopelessness, guilt/worthlessness, disturbed sleep pattern (sleeping 3  to 4 hours during the day) loss of interest, low energy, decreased appetite, isolative, crying spells deterioration of ADLs and recurrent thoughts of death.  He reports that his symptoms have increased due to multiple stomach surgeries, deconditioning, and increase in pain. Patient is on TPN to improve nutrition, assist with weight gain. He is also on a dysphagia 3 diet at this time, with no assistance required to feed himself. Patient has met his medically goals and now medically stable for transfer to inpatient psychiatric unit.   Past Psychiatric History: Depression  Risk to Self:   yes suicide attempt Risk to Others:   Denies Prior Inpatient Therapy:   Denies Prior Outpatient Therapy:   Denies  Past Medical History:  Past Medical History:  Diagnosis Date   Acute prostatitis 07/24/2007   Qualifier: Diagnosis of  By: [name] MD, [name]    Allergy    mild   Arthritis    osteoarthritis   Asthma    Blood transfusion without reported diagnosis    BPH (benign prostatic hypertrophy) with urinary obstruction    Crohn's ileitis ([place]) suspected 05/03/2017   Dilated aortic root ([place])    noted on echo 08/2012   Diverticulitis of colon    EPIDIDYMITIS 02/15/2010   Qualifier: Diagnosis of  By: [name] MD, [name]    GERD (gastroesophageal reflux disease)  H/O: GI bleed    Hemorrhoids    Hepatitis 1975   unknown type    HERPES SIMPLEX INFECTION 10/14/2007   Qualifier: Diagnosis of  By: [name] MD, [name]    Hiatal hernia    Ileus following gastrointestinal surgery ([place]) 12/26/2011   Long term (current) use of systemic steroids 06/18/2018   Psoriasis    sees [name] at [facility].   Recurrent ventral incisional hernia 05/10/2012   SVT (supraventricular tachycardia)    Ulcer 08/21/2013   ileal    Past Surgical History:  Procedure Laterality Date   BOWEL RESECTION  12/19/2011   Procedure: SMALL BOWEL RESECTION;  Surgeon: [name], MD;  Location: [facility];  Service: General;   Laterality: N/A;  with anastamosis and insertion mesh   BRONCHOSCOPY     COLON SURGERY  01/2004   x 2   COLONOSCOPY W/ BIOPSIES  04/26/2017   per [name], no polyps, benign inflammation, repeat in 5 yrs    CYSTOSCOPY     ESOPHAGOGASTRODUODENOSCOPY     HEMICOLECTOMY     left side, at [facility], diverticulitis   [place]     [phone] incisional hernia   ILEOSTOMY     ILEOSTOMY CLOSURE     INSERTION OF MESH  07/31/2012   Procedure: INSERTION OF MESH;  Surgeon: [name], MD;  Location: [facility];  Service: General;;   LAPAROTOMY  12/19/2011   Procedure: EXPLORATORY LAPAROTOMY;  Surgeon: [name], MD;  Location: [facility];  Service: General;  Laterality: N/A;   PACEMAKER IMPLANT N/A 12/01/2020   Procedure: PACEMAKER IMPLANT;  Surgeon: [name], MD;  Location: [facility];  Service: Cardiovascular;  Laterality: N/A;   PACEMAKER INSERTION Left    TONSILLECTOMY     UPPER GASTROINTESTINAL ENDOSCOPY     VASECTOMY     VENTRAL HERNIA REPAIR  07/31/2012   Procedure: HERNIA REPAIR VENTRAL ADULT;  Surgeon: [name], MD;  Location: [facility];  Service: General;  Laterality: N/A;   Family History:  Family History  Problem Relation Age of Onset   Lung cancer Mother    Leukemia Father    Hypertension Father    Heart disease Father    Heart attack Father    Prostate cancer Father    Prostate cancer Paternal Uncle    Colon cancer Neg Hx    Stomach cancer Neg Hx    Colon polyps Neg Hx    Esophageal cancer Neg Hx    Rectal cancer Neg Hx    Family Psychiatric  History: none reported  Social History:  Social History   Substance and Sexual Activity  Alcohol Use Not Currently   Comment: occ     Social History   Substance and Sexual Activity  Drug Use Yes   Types: Oxycodone    Social History   Socioeconomic History   Marital status: Married    Spouse name: Not on file   Number of children: 2   Years of  education: Not on file   Highest education level: Not on file  Occupational History   Occupation: EHS Freight forwarder    Employer: [employer]  Tobacco Use   Smoking status: Never   Smokeless tobacco: Never  Vaping Use   Vaping Use: Never used  Substance and Sexual Activity   Alcohol use: Not Currently    Comment: occ   Drug use: Yes    Types:  Oxycodone   Sexual activity: Not on file  Other Topics Concern   Not on file  Social History Narrative   He is married with 2 sons 1 son is an Art gallery manager and 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   He is Nurse, 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   Rare if any caffeine   Rare alcohol and never smoker   Social Determinants of Radio broadcast assistant Strain: Low Risk  (04/10/2022)   Overall Financial Resource Strain (CARDIA)    Difficulty of Paying Living Expenses: Not hard at all  Food Insecurity: No Food Insecurity (05/14/2022)   Hunger Vital Sign    Worried About Running Out of Food in the Last Year: Never true    [place] in the Last Year: Never true  Transportation Needs: No Transportation Needs (05/14/2022)   PRAPARE - Hydrologist (Medical): No    Lack of Transportation (Non-Medical): No  Physical Activity: Inactive (04/10/2022)   Exercise Vital Sign    Days of Exercise per Week: 0 days    Minutes of Exercise per Session: 0 min  Stress: Stress Concern Present (04/10/2022)   [place]    Feeling of Stress : To some extent  Social Connections: Not on file   Additional Social History:    Allergies:   Allergies  Allergen Reactions   Purinethol [Mercaptopurine] Other (See Comments)    Pancreatitis    Shellfish Allergy Anaphylaxis and Swelling    Can use standard SMOF lipid formulation for TPN without any issue.     Humira [Adalimumab] Other (See Comments)    Developed antibodies   Tape Rash   Wound Dressing Adhesive Rash    Labs:  Results for orders placed or performed during the hospital encounter of 05/13/22 (from the past 48 hour(s))  Glucose, capillary     Status: Abnormal   Collection Time: 06/03/22  8:00 PM  Result Value Ref Range   Glucose-Capillary 118 (H) 70 - 99 mg/dL    Comment: Glucose reference range applies only to samples taken after fasting for at least 8 hours.  Basic metabolic panel     Status: Abnormal   Collection Time: 06/04/22  2:20 AM  Result Value Ref Range   Sodium 136 135 - 145 mmol/L   Potassium 4.0 3.5 - 5.1 mmol/L   Chloride 107 98 - 111 mmol/L   CO2 24 22 - 32 mmol/L   Glucose, Bld 108 (H) 70 - 99 mg/dL    Comment: Glucose reference range applies only to samples taken after fasting for at least 8 hours.   BUN 28 (H) 8 - 23 mg/dL   Creatinine, Ser 0.97 0.61 - 1.24 mg/dL   Calcium 7.9 (L) 8.9 - 10.3 mg/dL   GFR, Estimated >60 >60 mL/min    Comment: (NOTE) Calculated using the CKD-EPI Creatinine Equation (2021)    Anion gap 5 5 - 15    Comment: Performed at [facility], [place] [address], [place], [place] [address]  Magnesium     Status: None   Collection Time: 06/04/22  2:20 AM  Result Value Ref Range   Magnesium 1.8 1.7 - 2.4 mg/dL    Comment: Performed at [facility], [place] [address], [place], [place] [address]  Phosphorus  Status: None   Collection Time: 06/04/22  2:20 AM  Result Value Ref Range   Phosphorus 3.0 2.5 - 4.6 mg/dL    Comment: Performed at [facility], [place] [address], [place], [place] [address]  Glucose, capillary     Status: Abnormal   Collection Time: 06/04/22  9:00 AM  Result Value Ref Range   Glucose-Capillary 115 (H) 70 - 99 mg/dL    Comment: Glucose reference range applies only to samples taken after fasting for at least 8 hours.  Glucose, capillary     Status: Abnormal    Collection Time: 06/04/22  7:36 PM  Result Value Ref Range   Glucose-Capillary 122 (H) 70 - 99 mg/dL    Comment: Glucose reference range applies only to samples taken after fasting for at least 8 hours.  Comprehensive metabolic panel     Status: Abnormal   Collection Time: 06/05/22  2:51 AM  Result Value Ref Range   Sodium 139 135 - 145 mmol/L   Potassium 4.0 3.5 - 5.1 mmol/L   Chloride 108 98 - 111 mmol/L   CO2 25 22 - 32 mmol/L   Glucose, Bld 116 (H) 70 - 99 mg/dL    Comment: Glucose reference range applies only to samples taken after fasting for at least 8 hours.   BUN 24 (H) 8 - 23 mg/dL   Creatinine, Ser 0.77 0.61 - 1.24 mg/dL   Calcium 8.0 (L) 8.9 - 10.3 mg/dL   Total Protein 6.0 (L) 6.5 - 8.1 g/dL   Albumin 2.5 (L) 3.5 - 5.0 g/dL   AST 49 (H) 15 - 41 U/L   ALT 38 0 - 44 U/L   Alkaline Phosphatase 127 (H) 38 - 126 U/L   Total Bilirubin 0.3 0.3 - 1.2 mg/dL   GFR, Estimated >60 >60 mL/min    Comment: (NOTE) Calculated using the CKD-EPI Creatinine Equation (2021)    Anion gap 6 5 - 15    Comment: Performed at [facility], [place] [address], [place], [place] [address]  Magnesium     Status: None   Collection Time: 06/05/22  2:51 AM  Result Value Ref Range   Magnesium 2.0 1.7 - 2.4 mg/dL    Comment: Performed at [facility], [place] [address], [place], [place] [address]  Phosphorus     Status: None   Collection Time: 06/05/22  2:51 AM  Result Value Ref Range   Phosphorus 2.8 2.5 - 4.6 mg/dL    Comment: Performed at [facility], [place] [address] [place], [place] [address]  Triglycerides     Status: None   Collection Time: 06/05/22  2:51 AM  Result Value Ref Range   Triglycerides 84 <150 mg/dL    Comment: Performed at [facility], [place] [address], [place], [place] [address]  Glucose, capillary     Status: None   Collection Time: 06/05/22  8:35 AM  Result Value Ref Range   Glucose-Capillary 83 70 -  99 mg/dL    Comment: Glucose reference range applies only to samples taken after fasting for at least 8 hours.    Current Facility-Administered Medications  Medication Dose Route Frequency Provider Last Rate Last Admin   0.9 %  sodium chloride infusion   Intravenous PRN Lavina Hamman, MD       acetaminophen (TYLENOL) suppository 325 mg  325 mg Rectal Q4H PRN Cherene Altes, MD   325 mg at 05/23/22 1124  busPIRone (BUSPAR) tablet 5 mg  5 mg Oral BID Eugenie Filler, MD   5 mg at 06/05/22 1437   Chlorhexidine Gluconate Cloth 2 % PADS 6 each  6 each Topical Daily Cherene Altes, MD   6 each at 06/05/22 1106   cyanocobalamin (VITAMIN B12) tablet 1,000 mcg  1,000 mcg Oral Daily Lavina Hamman, MD   1,000 mcg at 06/04/22 0936   enoxaparin (LOVENOX) injection 40 mg  40 mg Subcutaneous Q24H Joette Catching T, MD   40 mg at 06/04/22 2111   finasteride (PROSCAR) tablet 5 mg  5 mg Oral Daily Lavina Hamman, MD   5 mg at 06/05/22 1438   insulin aspart (novoLOG) injection 0-9 Units  0-9 Units Subcutaneous 2 times per day Emiliano Dyer, [facility]   1 Units at 06/04/22 2100   lidocaine (LIDODERM) 5 % 1 patch  1 patch Transdermal Q24H Cristal Generous, NP   1 patch at 06/04/22 1633   methocarbamol (ROBAXIN) tablet 500 mg  500 mg Oral TID Lavina Hamman, MD   500 mg at 06/05/22 1437   morphine (MSIR) tablet 15 mg  15 mg Oral Q3H PRN Lavina Hamman, MD   15 mg at 06/05/22 0018   ondansetron (ZOFRAN) injection 4 mg  4 mg Intravenous Q6H PRN Lavina Hamman, MD   4 mg at 06/05/22 6945   Oral care mouth rinse  15 mL Mouth Rinse PRN Candee Furbish, MD       pantoprazole (PROTONIX) EC tablet 40 mg  40 mg Oral Daily Eugenie Filler, MD   40 mg at 06/05/22 1104   prochlorperazine (COMPAZINE) tablet 10 mg  10 mg Oral Q6H PRN Eugenie Filler, MD   10 mg at 06/05/22 1109   psyllium (HYDROCIL/METAMUCIL) 1 packet  1 packet Oral Daily Eugenie Filler, MD   1 packet at 05/28/22 0388   sertraline  (ZOLOFT) tablet 100 mg  100 mg Oral Daily Suella Broad, FNP   100 mg at 06/05/22 1437   simethicone (MYLICON) chewable tablet 80 mg  80 mg Oral TID WC & HS Eugenie Filler, MD   80 mg at 06/05/22 0825   sodium chloride flush (NS) 0.9 % injection 10-40 mL  10-40 mL Intracatheter PRN Cherene Altes, MD   10 mL at 06/03/22 0509   sulfamethoxazole-trimethoprim (BACTRIM DS) 800-160 MG per tablet 1 tablet  1 tablet Oral Q12H Lavina Hamman, MD   1 tablet at 06/05/22 1104   tamsulosin (FLOMAX) capsule 0.4 mg  0.4 mg Oral QPC supper Eugenie Filler, MD   0.4 mg at 82/80/03 4917   TPN CYCLIC-ADULT (ION)   Intravenous Cyclic-See Admin Instructions Randa Spike, RPH        Musculoskeletal: Strength & Muscle Tone: Laying in bed   Gait & Station: Laying in bed   Patient leans: Laying in bed      Psychiatric Specialty Exam: Psychiatric Specialty Exam: Physical Exam Vitals and nursing note reviewed.  Constitutional:      Appearance: Normal appearance. He is normal weight.  Skin:    General: Skin is warm and dry.     Capillary Refill: Capillary refill takes less than 2 seconds.  Neurological:     General: No focal deficit present.     Mental Status: He is alert and oriented to person, place, and time. Mental status is at baseline.  Psychiatric:  Attention and Perception: Attention and perception normal.        Mood and Affect: Affect normal. Mood is anxious and depressed.        Speech: Speech normal.        Behavior: Behavior normal. Behavior is cooperative.        Thought Content: Thought content normal.        Cognition and Memory: Cognition normal.        Judgment: Judgment normal.     Review of Systems  Psychiatric/Behavioral:  Positive for depression and suicidal ideas (denies). Negative for hallucinations and substance abuse. The patient is nervous/anxious. The patient does not have insomnia.   All other systems reviewed and are negative.   Blood  pressure 112/62, pulse 72, temperature 98.3 F (36.8 C), resp. rate 17, height _0  (1.778 m), weight 68.1 kg, SpO2 96 %.Body mass index is 21.54 kg/m.  General Appearance: Fairly Groomed  Eye Contact:  Good  Speech:  Clear and Coherent and Normal Rate  Volume:  Normal  Mood:  Anxious  Affect:  Appropriate and Congruent  Thought Process:  Coherent, Goal Directed, Linear, and Descriptions of Associations: Intact  Orientation:  Full (Time, Place, and Person)  Thought Content:  Logical  Suicidal Thoughts:  No  Homicidal Thoughts:  No  Memory:  Immediate;   Fair Recent;   Fair  Judgement:  Fair  Insight:  Fair  Psychomotor Activity:  Normal  Concentration:  Concentration: Fair and Attention Span: Fair  Recall:  [employer] of Knowledge:  Good  Language:  Good  Akathisia:  No  Handed:  Right  AIMS (if indicated):     Assets:  Communication Skills Desire for Improvement Financial Resources/Insurance Leisure Time Resilience Social Support  ADL's:  Intact  Cognition:  WNL  Sleep:         Physical Exam: Physical Exam Vitals and nursing note reviewed.  Constitutional:      Appearance: Normal appearance. He is normal weight.  Skin:    General: Skin is warm and dry.     Capillary Refill: Capillary refill takes less than 2 seconds.  Neurological:     General: No focal deficit present.     Mental Status: He is alert and oriented to person, place, and time. Mental status is at baseline.  Psychiatric:        Attention and Perception: Attention and perception normal.        Mood and Affect: Affect normal. Mood is anxious and depressed.        Speech: Speech normal.        Behavior: Behavior normal. Behavior is cooperative.        Thought Content: Thought content normal.        Cognition and Memory: Cognition normal.        Judgment: Judgment normal.    Review of Systems  Psychiatric/Behavioral:  Positive for depression and suicidal ideas (denies). Negative for  hallucinations and substance abuse. The patient is nervous/anxious. The patient does not have insomnia.   All other systems reviewed and are negative.   Blood pressure 112/62, pulse 72, temperature 98.3 F (36.8 C), resp. rate 17, height _1  (1.778 m), weight 68.1 kg, SpO2 96 %. Body mass index is 21.54 kg/m.  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 Continue with\ referral to [facility], as patient has been difficult to place. -Continue sertraline 100 mg p.o. daily.  -Medical concerns were addressed with current hospitalist.  -Will attempt to make provisions for the patient to go outside once deemed safe and reasonable by physical therapy and or mobility specialist.  He will need to remain with safety sitter and additional staff.  Disposition: Recommend psychiatric Inpatient admission when medically cleared.  Suella Broad, FNP 06/05/2022 2:57 PM

## 2022-06-06 DIAGNOSIS — G9341 Metabolic encephalopathy: Secondary | ICD-10-CM | POA: Diagnosis not present

## 2022-06-06 LAB — CBC WITH DIFFERENTIAL/PLATELET
Abs Immature Granulocytes: 0 10*3/uL (ref 0.00–0.07)
Basophils Absolute: 0 10*3/uL (ref 0.0–0.1)
Basophils Relative: 0 %
Eosinophils Absolute: 0.2 10*3/uL (ref 0.0–0.5)
Eosinophils Relative: 7 %
HCT: 30.3 % — ABNORMAL LOW (ref 39.0–52.0)
Hemoglobin: 8.9 g/dL — ABNORMAL LOW (ref 13.0–17.0)
Immature Granulocytes: 0 %
Lymphocytes Relative: 49 %
Lymphs Abs: 1.2 10*3/uL (ref 0.7–4.0)
MCH: 23.5 pg — ABNORMAL LOW (ref 26.0–34.0)
MCHC: 29.4 g/dL — ABNORMAL LOW (ref 30.0–36.0)
MCV: 80.2 fL (ref 80.0–100.0)
Monocytes Absolute: 0.2 10*3/uL (ref 0.1–1.0)
Monocytes Relative: 7 %
Neutro Abs: 0.9 10*3/uL — ABNORMAL LOW (ref 1.7–7.7)
Neutrophils Relative %: 37 %
Platelets: 123 10*3/uL — ABNORMAL LOW (ref 150–400)
RBC: 3.78 MIL/uL — ABNORMAL LOW (ref 4.22–5.81)
RDW: 19.2 % — ABNORMAL HIGH (ref 11.5–15.5)
WBC: 2.5 10*3/uL — ABNORMAL LOW (ref 4.0–10.5)
nRBC: 0 % (ref 0.0–0.2)

## 2022-06-06 LAB — BASIC METABOLIC PANEL
Anion gap: 5 (ref 5–15)
BUN: 25 mg/dL — ABNORMAL HIGH (ref 8–23)
CO2: 25 mmol/L (ref 22–32)
Calcium: 8.1 mg/dL — ABNORMAL LOW (ref 8.9–10.3)
Chloride: 106 mmol/L (ref 98–111)
Creatinine, Ser: 0.84 mg/dL (ref 0.61–1.24)
GFR, Estimated: >60 mL/min (ref >60)
Glucose, Bld: 114 mg/dL — ABNORMAL HIGH (ref 70–99)
Potassium: 4.1 mmol/L (ref 3.5–5.1)
Sodium: 136 mmol/L (ref 135–145)

## 2022-06-06 LAB — GLUCOSE, CAPILLARY
Glucose-Capillary: 106 mg/dL — ABNORMAL HIGH (ref 70–99)
Glucose-Capillary: 120 mg/dL — ABNORMAL HIGH (ref 70–99)

## 2022-06-06 LAB — PROCALCITONIN: Procalcitonin: <0.1 ng/mL

## 2022-06-06 LAB — MAGNESIUM: Magnesium: 1.9 mg/dL (ref 1.7–2.4)

## 2022-06-06 MED ORDER — TRAVASOL 10 % IV SOLN
INTRAVENOUS | Status: AC
  Filled 2022-06-06: qty 1312.5

## 2022-06-06 NOTE — Progress Notes (Signed)
[facility] Progress Note Patient: [name] VZC:[id number] DOB: 1951/02/16 DOA: 05/13/2022  DOS: the patient was seen and examined on 06/06/2022  Brief hospital course: 71yo with hx Crohn's with persistent abd pain s/p extensive surgeries and is currently TPN dependent. Pt presented with unresponsiveness in apparent suicide attempt via overdose. Pt remained unresponsive and later was unable to maintain airway, prompting intubation and transfer to ICU. Pt now extubated and care transferred to hospitalist. Assessment and Plan: Intentional drug overdose. Suicide ideation. MDD. Patient presented with an unresponsive event at home. Noted to have intentionally taken pain medication and anxiolytics with intention for suicide. Psychiatry was consulted. Recommended inpatient psychiatric admission. Patient currently medically stable although unable to find a place that can accommodate TPN. Referred to [place].  Accepted.  Awaiting a bed   Acute toxic and metabolic encephalopathy. Likely combination of medication overdose as well as hypoxia from pneumonia. Head CT and MRI brain negative for any acute abnormality. Mentation improving. Monitor.   Aspiration pneumonia. Dysphagia. Completed IV antibiotic therapy. CT on 11/20 shows evidence of new evidence of right lower lobe infiltrate. Possibility of an atelectasis cannot be ruled out but more most likely this is pneumonitis. Procalcitonin level is negative.  Continue to monitor. Incentive spirometer added.   Dysphagia. Speech therapy following the patient. MBS was performed and patient was advanced to dysphagia 3 diet followed by advancing to regular diet. CT chest on 11/21 shows evidence of right lower lung infiltrate. Speech therapy consulted again.  Now back on D3 diet.  MBS scheduled later.   Crohn's disease. Patient had multiple surgeries in the past.currently not on any medical therapy. 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 Management per pharmacy.   Severe protein calorie malnutrition. Body mass index is 21.54 kg/m.  Nutrition Problem: Severe Malnutrition Etiology: chronic illness Interventions: TPN (increase nutrients) Continue the same.   Chronic prostatitis. Bladder calculi. BPH. Seen by [name] on 10/15. Patient is on Bactrim for 28 days since then although missed some doses while he was intubated. Currently based on my discussion with urology on 11/15 we will continue total 14-day treatment course for Bactrim. Patient had a Foley catheter placed in the ICU most likely due to critical condition. Patient is on Flomax also. Foley removed on 11/16.  Patient able to void after the removal. Discontinue oxybutynin as that can cause retention and increased PVR. Start on finasteride as well. Patient will follow-up with [name] for outpatient procedure for BPH.   Ileostomy leakage with irritation and dermatitis.. Wound care consulted. Currently being treated with stoma powder and frequent emptying. Stoma appears healthy and pink.  No surrounding skin cellulitis seen.  Patient is [name].  Patient had a PICC line placed in September 24 with the plan to continue TPN until the patient gains 30 to 40 pounds and then consider ileostomy closure. Patient was 130 pound at the placement of the PICC line. I discussed with [name] on 11/21.  I have informed her with regards to current ileostomy which appears to be healthy, as well as inform her with regards to weight gain.  Her primary concern is patient's need for weight gain as well as rehabilitation before considering any surgical intervention.  She currently agrees with the plan to go to [facility] or any inpatient psychiatric where the patient continues to have rehabilitation as well. Her main concern is patient should go to rehab to regain strength. Photograph below taken on  06/05/2022.Marland Kitchen     COVID exposure. Patient's wife and son both got tested positive on 11/15 for COVID-19. Family has been visiting the patient throughout the hospital stay. COVID-19 negative.  Currently no indication for isolation or repeating another COVID test.   Chronic pelvic pain. Patient continues to report worsening of his pelvic pain. X-ray hip and pelvis is unremarkable for any acute etiology. Patient had a CT abdomen and pelvis as well as CT chest angio abdomen and pelvis earlier last month during this hospital stay which were unremarkable. Patient is requiring IV Dilaudid. Patient reported to the same to psychiatry that the pain is actually causing him anxiety. Repeat CT abdomen pelvis on 06/05/2022 is negative for any acute abnormality to explain patient's pelvic pain.  Does have some chronic inflammation which is unchanged. Patient was informed that he does not require any IV narcotics as there is no acute fracture or ruptured viscus or any other acute abnormality. Patient told me that discussion with regards to transitioning from narcotics to other kind of medicine was associated with his suicidal attempt.   Transient A-fib. Resolved. Monitor.   Normocytic anemia.  Iron deficiency.  Relative vitamin B12 deficiency Baseline hemoglobin around 10. Currently hemoglobin around 8.  Iron level 18.  Will provide iron supplementation. Suspect this is from frequent blood draws and poor p.o. intake and nutritional status. Vitamin B12 301.  Continue with replacement.   Subjective: In good spirits.  Has some cough.  No nausea no vomiting no fever no chills.  No chest pain.  Physical Exam: General: Appear in mild distress; Cardiovascular: S1 and S2 Present, no Murmur, Respiratory: good respiratory effort, Bilateral Air entry present, CTA, no Crackles, no wheezes Abdomen: Bowel Sound present, mild diffuse tender  Extremities: no Pedal edema Neurology: alert and oriented to time, place, and person    Data Reviewed: I have Reviewed nursing notes, Vitals, and Lab results. Since last encounter, pertinent lab results CBC and BMP   . I have ordered test including CBC and BMP  . I have discussed pt's care plan and test results with patient's primary surgery team  .    Disposition: Status is: Inpatient Remains inpatient appropriate because: Unsafe discharge.  Need placement to [place].  enoxaparin (LOVENOX) injection 40 mg Start: 05/13/22 2200   Family Communication: Family at bedside Level of care: Med-Surg  Vitals:   06/05/22 2152 06/06/22 0500 06/06/22 1300 06/06/22 1402  BP: 114/64 107/67 117/63   Pulse: 64 69 66   Resp: 18 18 18    Temp: 97.8 F (36.6 C) 97.8 F (36.6 C) 97.8 F (36.6 C)   TempSrc: Axillary Axillary Oral   SpO2: 97% 98% 97%   Weight:    70.6 kg  Height:         Author: Berle Mull, MD 06/06/2022 6:28 PM  Please look on [email or web address] to find out who is on call.

## 2022-06-06 NOTE — Evaluation (Signed)
Clinical/Bedside Swallow Evaluation Patient Details  Name: [name] MRN: [id number] Date of Birth: October 05, 1950  Today's Date: 06/06/2022 Time: SLP Start Time (ACUTE ONLY): 7353 SLP Stop Time (ACUTE ONLY): 2992 SLP Time Calculation (min) (ACUTE ONLY): 30 min  Past Medical History:  Past Medical History:  Diagnosis Date   Acute prostatitis 07/24/2007   Qualifier: Diagnosis of  By: [name] MD, [name]    Allergy    mild   Arthritis    osteoarthritis   Asthma    Blood transfusion without reported diagnosis    BPH (benign prostatic hypertrophy) with urinary obstruction    Crohn's ileitis ([place]) suspected 05/03/2017   Dilated aortic root ([place])    noted on echo 08/2012   Diverticulitis of colon    EPIDIDYMITIS 02/15/2010   Qualifier: Diagnosis of  By: [name] MD, [name]    GERD (gastroesophageal reflux disease)    H/O: GI bleed    Hemorrhoids    Hepatitis 1975   unknown type    HERPES SIMPLEX INFECTION 10/14/2007   Qualifier: Diagnosis of  By: [name] MD, [name]    Hiatal hernia    Ileus following gastrointestinal surgery ([place]) 12/26/2011   Long term (current) use of systemic steroids 06/18/2018   Psoriasis    sees [name] at [facility].   Recurrent ventral incisional hernia 05/10/2012   SVT (supraventricular tachycardia)    Ulcer 08/21/2013   ileal   Past Surgical History:  Past Surgical History:  Procedure Laterality Date   BOWEL RESECTION  12/19/2011   Procedure: SMALL BOWEL RESECTION;  Surgeon: [name], MD;  Location: [facility];  Service: General;  Laterality: N/A;  with anastamosis and insertion mesh   BRONCHOSCOPY     COLON SURGERY  01/2004   x 2   COLONOSCOPY W/ BIOPSIES  04/26/2017   per [name], no polyps, benign inflammation, repeat in 5 yrs    CYSTOSCOPY     ESOPHAGOGASTRODUODENOSCOPY     HEMICOLECTOMY     left side, at [facility], diverticulitis   [place]     [id number] incisional hernia   ILEOSTOMY     ILEOSTOMY  CLOSURE     INSERTION OF MESH  07/31/2012   Procedure: INSERTION OF MESH;  Surgeon: [name], MD;  Location: [facility];  Service: General;;   LAPAROTOMY  12/19/2011   Procedure: EXPLORATORY LAPAROTOMY;  Surgeon: [name], MD;  Location: [facility];  Service: General;  Laterality: N/A;   PACEMAKER IMPLANT N/A 12/01/2020   Procedure: PACEMAKER IMPLANT;  Surgeon: [name], MD;  Location: [place] [facility];  Service: Cardiovascular;  Laterality: N/A;   PACEMAKER INSERTION Left    TONSILLECTOMY     UPPER GASTROINTESTINAL ENDOSCOPY     VASECTOMY     VENTRAL HERNIA REPAIR  07/31/2012   Procedure: HERNIA REPAIR VENTRAL ADULT;  Surgeon: [name], MD;  Location: [facility];  Service: General;  Laterality: N/A;   HPI:  Pt is a 71 yo male admit 10/28 after being obtunded found down 10/30 - ? polypharmacy,  tx to ICU for intubation, 10/31 waking up and following commands overnight.  He was extubated 11/1 and failed swallow screen thus SLP eval was ordered.  He also has h/o Crohns disease - complex hx including small bowel perf -surgical history, short gut- TPN.  MRI showed No acute intracranial abnormality.  2. Small amount of nonspecific T2 hyperintense lesions of the  white  matter, may represent early chronic microangiopathy.  Chest imaging concerning for right infrahilar airspace consolidation concerning  for pneumonia or aspiration.    Assessment / Plan / Recommendation  Clinical Impression  Repeat BSE ordered due to pt having increased coughing x1 week per pt and CT imaging concerning for pna. He is not spiking fevers nor having elevated WBC.  Admits to coughing more after intake - than during and denies issues with reflux.  Per review of prior MBS flouro loops, pt had significant vallecular retention - and sluggish recoil of epiglottis.  Today he is observed receiving medication from his RN with applesauce, which he chased with liquid - resulting in cough.  3 ounce Yale  water challenge conducted with pt immediately coughing after 2 ounces of water.  Cough is overt and nonproductive.  Question if pt's lengthy hospital coarse and further deconditioning is contributing to dysphagia and potentially poor tolerance.    Pt demonstrated use of IS - acheiving beyond 1750 with excellent efort. Using teach back, SLP reveiwed importance of taking small sips, avoiding "washing down food/pills with drink" - swallow solid first and then follow with liquids, sitting upright for po *pt tends to slip down in bed, and clinical reasoning for using IS.  Advised pt also to continue to work on his voice, cough and expectoration ability - pt advised he would do this - but one thing at a time and he wanted to work on getting his meal tray.  Repeat MBS indicated to allow viewing of pharyngeal swallow anatomy and physiology to maximize pt's airway protection.    Agree with dys3/thin diet pending testing - MBS tentatively planned for 0800 or 0830 with Xray staff. LPN and pt informed and agreeable. Thanks. SLP Visit Diagnosis: Dysphagia, pharyngeal phase (R13.13)    Aspiration Risk  Mild aspiration risk;Risk for inadequate nutrition/hydration    Diet Recommendation Dysphagia 3 (Mech soft);Thin liquid   Liquid Administration via: Cup Medication Administration: Whole meds with puree Supervision: Patient able to self feed Compensations: Slow rate;Small sips/bites Postural Changes: Seated upright at 90 degrees;Remain upright for at least 30 minutes after po intake    Other  Recommendations      Recommendations for follow up therapy are one component of a multi-disciplinary discharge planning process, led by the attending physician.  Recommendations may be updated based on patient status, additional functional criteria and insurance authorization.  Follow up Recommendations Skilled nursing-short term rehab (<3 hours/day)      Assistance Recommended at Discharge Frequent or constant  Supervision/Assistance  Functional Status Assessment Patient has had a recent decline in their functional status and demonstrates the ability to make significant improvements in function in a reasonable and predictable amount of time.  Frequency and Duration min 2x/week  1 week       Prognosis Prognosis for Safe Diet Advancement: Fair Barriers to Reach Goals: Time post onset;Severity of deficits      Swallow Study   General Date of Onset: 06/06/22 HPI: Pt is a 71 yo male admit 10/28 after being obtunded found down 10/30 - ? polypharmacy,  tx to ICU for intubation, 10/31 waking up and following commands overnight.  He was extubated 11/1 and failed swallow screen thus SLP eval was ordered.  He also has h/o Crohns disease - complex hx including small bowel perf -surgical history, short gut- TPN.  MRI showed No acute intracranial abnormality.  2. Small amount of nonspecific T2 hyperintense lesions of the white  matter, may represent  early chronic microangiopathy.  Chest imaging concerning for right infrahilar airspace consolidation concerning  for pneumonia or aspiration. Type of Study: Bedside Swallow Evaluation Previous Swallow Assessment: BSE 05/19/22 and prior MBS Diet Prior to this Study: Dysphagia 3 (soft);Thin liquids Temperature Spikes Noted: No Respiratory Status: Room air History of Recent Intubation: No Behavior/Cognition: Alert;Cooperative;Requires cueing Oral Cavity Assessment: Within Functional Limits Oral Care Completed by SLP: No Oral Cavity - Dentition: Adequate natural dentition Vision: Functional for self-feeding Self-Feeding Abilities: Able to feed self Patient Positioning: Other (comment) (pt requires total cues to attempt to maintain upright posture in bed) Baseline Vocal Quality: Normal Volitional Cough: Weak    Oral/Motor/Sensory Function Overall Oral Motor/Sensory Function: Within functional limits Facial ROM: Within Functional Limits Facial Symmetry: Within  Functional Limits Facial Strength: Within Functional Limits Facial Sensation: Within Functional Limits Lingual ROM: Within Functional Limits Lingual Symmetry: Within Functional Limits Lingual Strength: Within Functional Limits Lingual Sensation: Within Functional Limits Velum: Within Functional Limits Mandible: Within Functional Limits   Ice Chips Ice chips: Not tested   Thin Liquid Thin Liquid: Impaired Presentation: Cup Pharyngeal  Phase Impairments: Cough - Immediate Other Comments: pt did not pass 3ounce Yale swallow screen today due to overtly coughing after approx 2 ounces of intake; his cough is weak and nonproductive    Nectar Thick Nectar Thick Liquid: Not tested   Honey Thick Honey Thick Liquid: Not tested   Puree Puree: Within functional limits Presentation: Spoon   Solid     Solid: Not tested      Macario Golds 06/06/2022,6:32 PM  Kathleen Lime, MS [facility] SLP Acute Rehab Services Office [phone] Pager [phone]

## 2022-06-06 NOTE — TOC Progression Note (Addendum)
Transition of Care [facility]) - Progression Note    Patient Details  Name: [name] MRN: [id number] Date of Birth: 1951-01-10  Transition of [facility]) CM/SW [place], RN Phone Number: 06/06/2022, 12:31 PM  Clinical Narrative:  This RNCM spoke with Kenney Houseman at [facility] to refer patient for inpatient psychiatric gero (on TPN) bed. Per Kenney Houseman their facility is unable to offer an inpt psych bed due to they are at full capacity. Notified MD, RN.   TOC will continue to follow.     - 1:44pm This RNCM spoke with the patient's wife Katrina who advised her and the patient's wishes are to go to [facility] for inpatient psych services. Per Katrina, [name] is the patient's surgeon and recommends patient go to [facility] psych hospital. This RNCM advised of previous conversation with Kenney Houseman at [facility] who advised they are unable to review for placement due to no capacity. Patient's wife Katrina states she will contact [name] to see if she is able to coordinate the patient getting a bed at [facility]. This RNCM explained referral process and we can only provide details based on offers that are received.      TOC will continue to follow.   Expected Discharge Plan:  (TBD) Barriers to Discharge: Continued Medical Work up  Expected Discharge Plan and Services Expected Discharge Plan:  (TBD)   Discharge Planning Services: CM Consult Post Acute Care Choice: [place] Living arrangements for the past 2 months: Single Family Home                                       Social Determinants of Health (SDOH) Interventions    Readmission Risk Interventions    05/15/2022   10:53 AM  Readmission Risk Prevention Plan  Transportation Screening Complete  Medication Review (RN Care Manager) Complete  PCP or Specialist appointment within 3-5 days of discharge Complete  HRI or Home Care Consult  Complete  SW Recovery Care/Counseling Consult Complete  [place] Complete

## 2022-06-06 NOTE — Plan of Care (Signed)
  Problem: Nutrition: Goal: Adequate nutrition will be maintained Outcome: Progressing   Problem: Pain Managment: Goal: General experience of comfort will improve Outcome: Progressing   

## 2022-06-06 NOTE — Progress Notes (Signed)
PHARMACY - TOTAL PARENTERAL NUTRITION CONSULT NOTE   Indication: Crohn's disease and complex past surgical history including ex lap with small bowel resection, lysis of adhesion, mesh explantation, and diverting loop ileostomy in April 2023, on TPN prior to admission  Patient Measurements: Height: _0  (177.8 cm) Weight: 68.1 kg (150 lb 1.6 oz) IBW/kg (Calculated) : 73 TPN AdjBW (KG): 68.1 Body mass index is 21.54 kg/m.  Assessment: Patient with PMH of Crohn's disease and complex surgical history as above on chronic TPN who presented on 05/13/22 with altered mental status after spouse found patient unresponsive with multiple pill bottles open around him. Patient was transported from SNF to ED the day prior for generalized weakness and acute worsening of his chronic abdominal pain. Patient decided to return home instead of his SNF. Pharmacy consulted to resume TPN as inpatient.   Discharge from [facility] 04/19/22:  TPN pharmacist at [facility]:  Estimated Nutrient Requirements: Protein: 1.5 - 1.7 g/kg/day (98 - 104 g/day), Total kcal: 30 - 35 kcal/kg/day (1830 - 1983 kcal/day)  TPN Formulation at [place] per TPN pharmacist: [A 6.6%, D 20%, F 3.1%] @ goal rate, 65 mL/hr (TV: 1560 mL/day) provides: Protein 1.7 g/kg/day 103 g/day, Total kcal 32 kcal/kg/day 1971 kcal/day  NPC: N ratio 94   Admitted at [facility] 04/29/22-05/03/22: Nutritional Goals: TPN 80 mL/hr (provides 113 g of protein and 1943 kcals per day) RD Assessment: (10/16): 2100-2450kcal (30-35kcal/kg), Protein 105-130g (1.5-1.8g/kg) Utilize intralipid rather than SMOF as 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  [facility]: [name], PharmD, ph: [phone] 10/28 Most recent formula: Total volume 1560 mL:  Dextrose 220 g/day, Plenamine 120 g/day, SMOFlipid 50 g/day, NaCl 75 mEq, CaGluc 6 mEq, Potassium Ac 20 mEq, Potassium Phos 13.5 nM, Mag Sulfate 8 mEq, Vitamins/trace  elements  Glucose / Insulin: No hx DM.  CBGs at goal <150. Electrolytes: lytes wnl including CorrCa which is 9.2  Note Bactrim started on 11/19, continue to monitor electrolytes.  Renal: SCr ok; BUN slightly elevated at 25; UOP remains adequate Hepatic:  - AST slightly bumped; ALT wnl, Alk phos remains elevated but improving  - Albumin low 2.5 - TG 84 (11/20) I/O: I/O not fully recorded - ileostomy output inc to ~1.5 L/day, UOP ~ 2.5 L/day GI Imaging: - 11/6 abd [place] - normal liver (checked d/t elevated LFTs) - 10/27 CTa/p: no significant interval change since 10/14 CT. No evidence of bowel obstruction or other complicating feature at the anastomotic site or ostomy. - 10/14 CTa/p: previous surgical changes noted (transverse-sigmoid anastomosis) with stable inflammation since 10/12 CT GI Surgeries / Procedures: n/a  Central access: prior to admission - double lumen CVC TPN start date: started PTA  Nutritional Goals: TPN provides 131 g of protein and 2010 kcals per day.  RD Assessment this admission:    Estimated Needs Total Energy Estimated Needs: 2015-2350kcal Total Protein Estimated Needs: 100-135g Total Fluid Estimated Needs: 2465m (Holliday Segar)  Current Nutrition:  TPN Dysphagia-3 11/9 >> Regular 109/98>>   Plan:  Cyclic TPN with 23382ml over 14 hrs GIR 2.73 - 5.45  Electrolytes in TPN:  Na 75 mEq/L K 45 mEq/L Ca 5 mEq/L Mg 5 mEq/L Phos 15 mmol/L Cl:Ac = 1:1 Add standard MVI and trace elements to TPN Continue Pepcid in TPN  With excellent CBG control, will limit finger sticks to twice daily.  Timed 2 hrs after TPN start, 1 hour after TPN stop  IVF dc'd by MD Monitor TPN labs on Mon/Thurs at minimum  [facility] PharmD, BCPS [facility] main pharmacy [phone] 06/06/2022 8:02 AM

## 2022-06-06 NOTE — Plan of Care (Signed)
  Problem: Health Behavior/Discharge Planning: Goal: Ability to manage health-related needs will improve Outcome: Progressing   Problem: Clinical Measurements: Goal: Will remain free from infection Outcome: Progressing Goal: Diagnostic test results will improve Outcome: Progressing

## 2022-06-07 ENCOUNTER — Inpatient Hospital Stay (HOSPITAL_COMMUNITY): Payer: Medicare Other

## 2022-06-07 DIAGNOSIS — G9341 Metabolic encephalopathy: Secondary | ICD-10-CM | POA: Diagnosis not present

## 2022-06-07 DIAGNOSIS — R401 Stupor: Secondary | ICD-10-CM | POA: Diagnosis not present

## 2022-06-07 DIAGNOSIS — T1491XA Suicide attempt, initial encounter: Secondary | ICD-10-CM | POA: Diagnosis not present

## 2022-06-07 DIAGNOSIS — J9601 Acute respiratory failure with hypoxia: Secondary | ICD-10-CM | POA: Diagnosis not present

## 2022-06-07 DIAGNOSIS — D649 Anemia, unspecified: Secondary | ICD-10-CM | POA: Diagnosis not present

## 2022-06-07 DIAGNOSIS — G894 Chronic pain syndrome: Secondary | ICD-10-CM | POA: Diagnosis not present

## 2022-06-07 LAB — COMPREHENSIVE METABOLIC PANEL
ALT: 42 U/L (ref 0–44)
AST: 46 U/L — ABNORMAL HIGH (ref 15–41)
Albumin: 2.6 g/dL — ABNORMAL LOW (ref 3.5–5.0)
Alkaline Phosphatase: 137 U/L — ABNORMAL HIGH (ref 38–126)
Anion gap: 4 — ABNORMAL LOW (ref 5–15)
BUN: 28 mg/dL — ABNORMAL HIGH (ref 8–23)
CO2: 26 mmol/L (ref 22–32)
Calcium: 8.2 mg/dL — ABNORMAL LOW (ref 8.9–10.3)
Chloride: 108 mmol/L (ref 98–111)
Creatinine, Ser: 0.93 mg/dL (ref 0.61–1.24)
GFR, Estimated: >60 mL/min (ref >60)
Glucose, Bld: 100 mg/dL — ABNORMAL HIGH (ref 70–99)
Potassium: 4.4 mmol/L (ref 3.5–5.1)
Sodium: 138 mmol/L (ref 135–145)
Total Bilirubin: 0.3 mg/dL (ref 0.3–1.2)
Total Protein: 6.4 g/dL — ABNORMAL LOW (ref 6.5–8.1)

## 2022-06-07 LAB — CBC
HCT: 29.5 % — ABNORMAL LOW (ref 39.0–52.0)
Hemoglobin: 8.8 g/dL — ABNORMAL LOW (ref 13.0–17.0)
MCH: 24.3 pg — ABNORMAL LOW (ref 26.0–34.0)
MCHC: 29.8 g/dL — ABNORMAL LOW (ref 30.0–36.0)
MCV: 81.5 fL (ref 80.0–100.0)
Platelets: 120 10*3/uL — ABNORMAL LOW (ref 150–400)
RBC: 3.62 MIL/uL — ABNORMAL LOW (ref 4.22–5.81)
RDW: 18.9 % — ABNORMAL HIGH (ref 11.5–15.5)
WBC: 3.8 10*3/uL — ABNORMAL LOW (ref 4.0–10.5)
nRBC: 0 % (ref 0.0–0.2)

## 2022-06-07 LAB — GLUCOSE, CAPILLARY
Glucose-Capillary: 112 mg/dL — ABNORMAL HIGH (ref 70–99)
Glucose-Capillary: 123 mg/dL — ABNORMAL HIGH (ref 70–99)

## 2022-06-07 LAB — PROCALCITONIN: Procalcitonin: <0.1 ng/mL

## 2022-06-07 MED ORDER — TRAVASOL 10 % IV SOLN
INTRAVENOUS | Status: AC
  Filled 2022-06-07: qty 1312.5

## 2022-06-07 NOTE — Consult Note (Addendum)
Face-to-Face Psychiatry Consult   Reason for Consult:  Suicide Attempt  Referring Physician:  Red Christians   Patient Identification: [name] MRN:  [id number] Principal Diagnosis: Metabolic encephalopathy Diagnosis:  Principal Problem:   Metabolic encephalopathy Active Problems:   Chronic anemia   Acute respiratory failure with hypoxia (HCC)   Severe malnutrition (HCC)   Generalized weakness   Drug overdose, intentional, initial encounter ([place])   Chronic pain syndrome   Aspiration pneumonia ([place])   Total Time spent with patient: 20 minutes  Subjective:   71yo with hx Crohn's with persistent abd pain s/p extensive surgeries and is currently TPN dependent. Pt presented with unresponsiveness in apparent suicide attempt via overdose.    During evaluation KOA ZOELLER is seated at the edge of hospital chair. He is alert/oriented x 4; calm/cooperative; and mood congruent with affect. Patient is speaking in a clear tone at moderate volume, and normal pace; with good eye contact.  His thought process is coherent and relevant; There is no indication that he is currently responding to internal/external stimuli or experiencing delusional thought content.  Patient denies suicidal/self-harm/homicidal ideation, psychosis, and paranoia.  Patient has remained calm throughout assessment and has answered questions appropriately.  He denies any active suicidal thoughts at this time. However reports he has been more tearful, even crying today as he misses his wife. We did review all options to include inpatient psych, outpatient IOP with safe disposition, ongoing medical treatment with private therapists coming into the hospital, or transfer to tertiary center for comprehensive services to include psychiatry.    Son was present during todays evaluation. All options, questions, comments, and concerns were addressed. Son did ask about hospital transfer under the care of [name]. He is advised this will  require coordination through her office, with understanding he will need ongoing psychiatry services while hospitalized.   Psychiatry consult service will continue to follow at this time.  Recommendation will be for patient to continue inpatient psychiatric services, ongoing referral to [facility] has been placed.  Patient is currently voluntary, however in the event he attempts to leave the hospital he will need to be placed under involuntary commitment as he remains a danger to his self at this time of the evaluation.  Safety Assessment: Individualized risk factors include: age, caucasian , male gender and  previous suicide attempt, ongoing depression and anxiety, serious illness such as chronic pain, sense of hopelessness.. Individualized protective factors include: Patient has treatable psychiatric disorders and symptoms, No access to firearms or other means of harm, Future oriented, Reality-based and No history of head injury. Taking the aforementioned non-modifiable and modifiable risk factors in conjunction with his protective factors, the patient is currently felt to be of moderate to high imminent risk of harm to self. To further mitigate risk, please see the below treatment recommendations.    HPI: Currently on interview, the patient. Alert and oriented x4. Patient reports presenting to the emergency department after a suicide attempt via after taking multiple pills in an attempt to end his life. Over the past couple months he reports worsening depression and anxiety. He reports symptoms of depression including hopelessness, guilt/worthlessness, disturbed sleep pattern (sleeping 3 to 4 hours during the day) loss of interest, low energy, decreased appetite, isolative, crying spells deterioration of ADLs and recurrent thoughts of death.  He reports that his symptoms have increased due to multiple stomach surgeries, deconditioning, and increase in pain. Patient is on TPN to improve  nutrition, assist with  weight gain. He is also on a dysphagia 3 diet at this time, with no assistance required to feed himself. Patient has met his medically goals and now medically stable for transfer to inpatient psychiatric unit.   Past Psychiatric History: Depression  Risk to Self:   yes suicide attempt Risk to Others:   Denies Prior Inpatient Therapy:   Denies Prior Outpatient Therapy:   Denies  Past Medical History:  Past Medical History:  Diagnosis Date   Acute prostatitis 07/24/2007   Qualifier: Diagnosis of  By: [name] MD, [name]    Allergy    mild   Arthritis    osteoarthritis   Asthma    Blood transfusion without reported diagnosis    BPH (benign prostatic hypertrophy) with urinary obstruction    Crohn's ileitis ([place]) suspected 05/03/2017   Dilated aortic root ([place])    noted on echo 08/2012   Diverticulitis of colon    EPIDIDYMITIS 02/15/2010   Qualifier: Diagnosis of  By: [name] MD, [name]    GERD (gastroesophageal reflux disease)    H/O: GI bleed    Hemorrhoids    Hepatitis 1975   unknown type    HERPES SIMPLEX INFECTION 10/14/2007   Qualifier: Diagnosis of  By: [name] MD, [name]    Hiatal hernia    Ileus following gastrointestinal surgery ([place]) 12/26/2011   Long term (current) use of systemic steroids 06/18/2018   Psoriasis    sees [name] at [facility].   Recurrent ventral incisional hernia 05/10/2012   SVT (supraventricular tachycardia)    Ulcer 08/21/2013   ileal    Past Surgical History:  Procedure Laterality Date   BOWEL RESECTION  12/19/2011   Procedure: SMALL BOWEL RESECTION;  Surgeon: [name], MD;  Location: [facility];  Service: General;  Laterality: N/A;  with anastamosis and insertion mesh   BRONCHOSCOPY     COLON SURGERY  01/2004   x 2   COLONOSCOPY W/ BIOPSIES  04/26/2017   per [name], no polyps, benign inflammation, repeat in 5 yrs    CYSTOSCOPY     ESOPHAGOGASTRODUODENOSCOPY     HEMICOLECTOMY     left side, at  [facility], diverticulitis   [place]     [phone] incisional hernia   ILEOSTOMY     ILEOSTOMY CLOSURE     INSERTION OF MESH  07/31/2012   Procedure: INSERTION OF MESH;  Surgeon: [name], MD;  Location: [facility];  Service: General;;   LAPAROTOMY  12/19/2011   Procedure: EXPLORATORY LAPAROTOMY;  Surgeon: [name], MD;  Location: [facility];  Service: General;  Laterality: N/A;   PACEMAKER IMPLANT N/A 12/01/2020   Procedure: PACEMAKER IMPLANT;  Surgeon: [name], MD;  Location: [facility];  Service: Cardiovascular;  Laterality: N/A;   PACEMAKER INSERTION Left    TONSILLECTOMY     UPPER GASTROINTESTINAL ENDOSCOPY     VASECTOMY     VENTRAL HERNIA REPAIR  07/31/2012   Procedure: HERNIA REPAIR VENTRAL ADULT;  Surgeon: [name], MD;  Location: [facility];  Service: General;  Laterality: N/A;   Family History:  Family History  Problem Relation Age of Onset   Lung cancer Mother    Leukemia Father    Hypertension Father    Heart disease Father    Heart attack Father    Prostate cancer Father    Prostate cancer Paternal Uncle    Colon cancer Neg Hx  Stomach cancer Neg Hx    Colon polyps Neg Hx    Esophageal cancer Neg Hx    Rectal cancer Neg Hx    Family Psychiatric  History: none reported  Social History:  Social History   Substance and Sexual Activity  Alcohol Use Not Currently   Comment: occ     Social History   Substance and Sexual Activity  Drug Use Yes   Types: Oxycodone    Social History   Socioeconomic History   Marital status: Married    Spouse name: Not on file   Number of children: 2   Years of education: Not on file   Highest education level: Not on file  Occupational History   Occupation: EHS Freight forwarder    Employer: [employer]  Tobacco Use   Smoking status: Never   Smokeless tobacco: Never  Vaping Use   Vaping Use: Never used  Substance and Sexual Activity   Alcohol use: Not  Currently    Comment: occ   Drug use: Yes    Types: Oxycodone   Sexual activity: Not on file  Other Topics Concern   Not on file  Social History Narrative   He is married with 2 sons 1 son is an Art gallery manager and 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   He is Nurse, 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   Rare if any caffeine   Rare alcohol and never smoker   Social Determinants of Radio broadcast assistant Strain: Low Risk  (04/10/2022)   Overall Financial Resource Strain (CARDIA)    Difficulty of Paying Living Expenses: Not hard at all  Food Insecurity: No Food Insecurity (05/14/2022)   Hunger Vital Sign    Worried About Running Out of Food in the Last Year: Never true    [place] in the Last Year: Never true  Transportation Needs: No Transportation Needs (05/14/2022)   PRAPARE - Hydrologist (Medical): No    Lack of Transportation (Non-Medical): No  Physical Activity: Inactive (04/10/2022)   Exercise Vital Sign    Days of Exercise per Week: 0 days    Minutes of Exercise per Session: 0 min  Stress: Stress Concern Present (04/10/2022)   [place]    Feeling of Stress : To some extent  Social Connections: Not on file   Additional Social History:    Allergies:   Allergies  Allergen Reactions   Purinethol [Mercaptopurine] Other (See Comments)    Pancreatitis    Shellfish Allergy Anaphylaxis and Swelling    Can use standard SMOF lipid formulation for TPN without any issue.    Humira [Adalimumab] Other (See Comments)    Developed antibodies   Tape Rash   Wound Dressing Adhesive Rash    Labs:  Results for orders placed or performed during the hospital encounter of 05/13/22 (from the past 48 hour(s))  Glucose, capillary     Status: Abnormal   Collection Time:  06/05/22  7:48 PM  Result Value Ref Range   Glucose-Capillary 100 (H) 70 - 99 mg/dL    Comment: Glucose reference range applies only to samples taken after fasting for at least 8 hours.  Glucose, capillary     Status: Abnormal   Collection Time: 06/06/22  8:04 AM  Result Value Ref Range   Glucose-Capillary  106 (H) 70 - 99 mg/dL    Comment: Glucose reference range applies only to samples taken after fasting for at least 8 hours.  Procalcitonin - Baseline     Status: None   Collection Time: 06/06/22 10:12 AM  Result Value Ref Range   Procalcitonin <0.10 ng/mL    Comment:        Interpretation: PCT (Procalcitonin) <= 0.5 ng/mL: Systemic infection (sepsis) is not likely. Local bacterial infection is possible. (NOTE)       Sepsis PCT Algorithm           Lower Respiratory Tract                                      Infection PCT Algorithm    ----------------------------     ----------------------------         PCT < 0.25 ng/mL                PCT < 0.10 ng/mL          Strongly encourage             Strongly discourage   discontinuation of antibiotics    initiation of antibiotics    ----------------------------     -----------------------------       PCT 0.25 - 0.50 ng/mL            PCT 0.10 - 0.25 ng/mL               OR       >80% decrease in PCT            Discourage initiation of                                            antibiotics      Encourage discontinuation           of antibiotics    ----------------------------     -----------------------------         PCT >= 0.50 ng/mL              PCT 0.26 - 0.50 ng/mL               AND        <80% decrease in PCT             Encourage initiation of                                             antibiotics       Encourage continuation           of antibiotics    ----------------------------     -----------------------------        PCT >= 0.50 ng/mL                  PCT > 0.50 ng/mL               AND         increase in PCT                   Strongly encourage  initiation of antibiotics    Strongly encourage escalation           of antibiotics                                     -----------------------------                                           PCT <= 0.25 ng/mL                                                 OR                                        > 80% decrease in PCT                                      Discontinue / Do not initiate                                             antibiotics  Performed at [facility] [address] [place], [place] [address]   CBC with Differential/Platelet     Status: Abnormal   Collection Time: 06/06/22 10:12 AM  Result Value Ref Range   WBC 2.5 (L) 4.0 - 10.5 K/uL   RBC 3.78 (L) 4.22 - 5.81 MIL/uL   Hemoglobin 8.9 (L) 13.0 - 17.0 g/dL   HCT 30.3 (L) 39.0 - 52.0 %   MCV 80.2 80.0 - 100.0 fL   MCH 23.5 (L) 26.0 - 34.0 pg   MCHC 29.4 (L) 30.0 - 36.0 g/dL   RDW 19.2 (H) 11.5 - 15.5 %   Platelets 123 (L) 150 - 400 K/uL   nRBC 0.0 0.0 - 0.2 %   Neutrophils Relative % 37 %   Neutro Abs 0.9 (L) 1.7 - 7.7 K/uL   Lymphocytes Relative 49 %   Lymphs Abs 1.2 0.7 - 4.0 K/uL   Monocytes Relative 7 %   Monocytes Absolute 0.2 0.1 - 1.0 K/uL   Eosinophils Relative 7 %   Eosinophils Absolute 0.2 0.0 - 0.5 K/uL   Basophils Relative 0 %   Basophils Absolute 0.0 0.0 - 0.1 K/uL   Immature Granulocytes 0 %   Abs Immature Granulocytes 0.00 0.00 - 0.07 K/uL   Polychromasia PRESENT     Comment: Performed at [facility], [place] [address], [place],  [address]  Basic metabolic panel     Status: Abnormal   Collection Time: 06/06/22 10:12 AM  Result Value Ref Range   Sodium 136 135 - 145 mmol/L   Potassium 4.1 3.5 - 5.1 mmol/L   Chloride 106 98 - 111 mmol/L   CO2 25 22 - 32 mmol/L   Glucose, Bld 114 (H) 70 - 99 mg/dL    Comment: Glucose reference range applies only to samples taken  after fasting for at least 8  hours.   BUN 25 (H) 8 - 23 mg/dL   Creatinine, Ser 0.84 0.61 - 1.24 mg/dL   Calcium 8.1 (L) 8.9 - 10.3 mg/dL   GFR, Estimated >60 >60 mL/min    Comment: (NOTE) Calculated using the CKD-EPI Creatinine Equation (2021)    Anion gap 5 5 - 15    Comment: Performed at [facility], [place] [address], [place], [place] [address]  Magnesium     Status: None   Collection Time: 06/06/22 10:12 AM  Result Value Ref Range   Magnesium 1.9 1.7 - 2.4 mg/dL    Comment: Performed at [facility], [place] [address] [place], [place] [address]  Glucose, capillary     Status: Abnormal   Collection Time: 06/06/22  7:31 PM  Result Value Ref Range   Glucose-Capillary 120 (H) 70 - 99 mg/dL    Comment: Glucose reference range applies only to samples taken after fasting for at least 8 hours.  Comprehensive metabolic panel     Status: Abnormal   Collection Time: 06/07/22  4:23 AM  Result Value Ref Range   Sodium 138 135 - 145 mmol/L   Potassium 4.4 3.5 - 5.1 mmol/L   Chloride 108 98 - 111 mmol/L   CO2 26 22 - 32 mmol/L   Glucose, Bld 100 (H) 70 - 99 mg/dL    Comment: Glucose reference range applies only to samples taken after fasting for at least 8 hours.   BUN 28 (H) 8 - 23 mg/dL   Creatinine, Ser 0.93 0.61 - 1.24 mg/dL   Calcium 8.2 (L) 8.9 - 10.3 mg/dL   Total Protein 6.4 (L) 6.5 - 8.1 g/dL   Albumin 2.6 (L) 3.5 - 5.0 g/dL   AST 46 (H) 15 - 41 U/L   ALT 42 0 - 44 U/L   Alkaline Phosphatase 137 (H) 38 - 126 U/L   Total Bilirubin 0.3 0.3 - 1.2 mg/dL   GFR, Estimated >60 >60 mL/min    Comment: (NOTE) Calculated using the CKD-EPI Creatinine Equation (2021)    Anion gap 4 (L) 5 - 15    Comment: Performed at [facility], [place] [address] [place],  [address]  Procalcitonin     Status: None   Collection Time: 06/07/22  4:23 AM  Result Value Ref Range   Procalcitonin <0.10 ng/mL    Comment:        Interpretation: PCT (Procalcitonin) <= 0.5  ng/mL: Systemic infection (sepsis) is not likely. Local bacterial infection is possible. (NOTE)       Sepsis PCT Algorithm           Lower Respiratory Tract                                      Infection PCT Algorithm    ----------------------------     ----------------------------         PCT < 0.25 ng/mL                PCT < 0.10 ng/mL          Strongly encourage             Strongly discourage   discontinuation of antibiotics    initiation of antibiotics    ----------------------------     -----------------------------       PCT 0.25 - 0.50  ng/mL            PCT 0.10 - 0.25 ng/mL               OR       >80% decrease in PCT            Discourage initiation of                                            antibiotics      Encourage discontinuation           of antibiotics    ----------------------------     -----------------------------         PCT >= 0.50 ng/mL              PCT 0.26 - 0.50 ng/mL               AND        <80% decrease in PCT             Encourage initiation of                                             antibiotics       Encourage continuation           of antibiotics    ----------------------------     -----------------------------        PCT >= 0.50 ng/mL                  PCT > 0.50 ng/mL               AND         increase in PCT                  Strongly encourage                                      initiation of antibiotics    Strongly encourage escalation           of antibiotics                                     -----------------------------                                           PCT <= 0.25 ng/mL                                                 OR                                        > 80% decrease in PCT  Discontinue / Do not initiate                                             antibiotics  Performed at [facility] [address] [place], [place] [address]   CBC     Status: Abnormal    Collection Time: 06/07/22  4:23 AM  Result Value Ref Range   WBC 3.8 (L) 4.0 - 10.5 K/uL   RBC 3.62 (L) 4.22 - 5.81 MIL/uL   Hemoglobin 8.8 (L) 13.0 - 17.0 g/dL   HCT 29.5 (L) 39.0 - 52.0 %   MCV 81.5 80.0 - 100.0 fL   MCH 24.3 (L) 26.0 - 34.0 pg   MCHC 29.8 (L) 30.0 - 36.0 g/dL   RDW 18.9 (H) 11.5 - 15.5 %   Platelets 120 (L) 150 - 400 K/uL   nRBC 0.0 0.0 - 0.2 %    Comment: Performed at [facility], [place] [address], [place], [place] [address]  Glucose, capillary     Status: Abnormal   Collection Time: 06/07/22  8:51 AM  Result Value Ref Range   Glucose-Capillary 112 (H) 70 - 99 mg/dL    Comment: Glucose reference range applies only to samples taken after fasting for at least 8 hours.    Current Facility-Administered Medications  Medication Dose Route Frequency Provider Last Rate Last Admin   0.9 %  sodium chloride infusion   Intravenous PRN Lavina Hamman, MD       acetaminophen (TYLENOL) suppository 325 mg  325 mg Rectal Q4H PRN Cherene Altes, MD   325 mg at 05/23/22 1124   busPIRone (BUSPAR) tablet 5 mg  5 mg Oral BID Eugenie Filler, MD   5 mg at 06/07/22 0944   Chlorhexidine Gluconate Cloth 2 % PADS 6 each  6 each Topical Daily Cherene Altes, MD   6 each at 06/07/22 1131   cyanocobalamin (VITAMIN B12) tablet 1,000 mcg  1,000 mcg Oral Daily Lavina Hamman, MD   1,000 mcg at 06/07/22 0944   enoxaparin (LOVENOX) injection 40 mg  40 mg Subcutaneous Q24H Cherene Altes, MD   40 mg at 06/06/22 1942   finasteride (PROSCAR) tablet 5 mg  5 mg Oral Daily Lavina Hamman, MD   5 mg at 06/07/22 0945   insulin aspart (novoLOG) injection 0-9 Units  0-9 Units Subcutaneous 2 times per day Emiliano Dyer, [facility]   1 Units at 06/04/22 2100   lidocaine (LIDODERM) 5 % 1 patch  1 patch Transdermal Q24H Cristal Generous, NP   1 patch at 06/06/22 1703   methocarbamol (ROBAXIN) tablet 500 mg  500 mg Oral TID Lavina Hamman, MD   500 mg at 06/07/22 0944   morphine  (MSIR) tablet 15 mg  15 mg Oral Q3H PRN Lavina Hamman, MD   15 mg at 06/07/22 1200   ondansetron (ZOFRAN) injection 4 mg  4 mg Intravenous Q6H PRN Lavina Hamman, MD   4 mg at 06/07/22 1021   Oral care mouth rinse  15 mL Mouth Rinse PRN Candee Furbish, MD       pantoprazole (PROTONIX) EC tablet 40 mg  40 mg Oral Daily Eugenie Filler, MD   40 mg at 06/07/22 0944   prochlorperazine (COMPAZINE) tablet 10 mg  10 mg Oral Q6H  PRN Eugenie Filler, MD   10 mg at 06/06/22 1149   psyllium (HYDROCIL/METAMUCIL) 1 packet  1 packet Oral Daily Eugenie Filler, MD   1 packet at 05/28/22 1950   sertraline (ZOLOFT) tablet 100 mg  100 mg Oral Daily Suella Broad, FNP   100 mg at 06/07/22 0944   simethicone (MYLICON) chewable tablet 80 mg  80 mg Oral TID WC & HS Eugenie Filler, MD   80 mg at 06/07/22 1201   sodium chloride flush (NS) 0.9 % injection 10-40 mL  10-40 mL Intracatheter PRN Cherene Altes, MD   10 mL at 06/03/22 0509   sulfamethoxazole-trimethoprim (BACTRIM DS) 800-160 MG per tablet 1 tablet  1 tablet Oral Q12H Lavina Hamman, MD   1 tablet at 06/07/22 0946   tamsulosin (FLOMAX) capsule 0.4 mg  0.4 mg Oral QPC supper Eugenie Filler, MD   0.4 mg at 93/26/71 2458   TPN CYCLIC-ADULT (ION)   Intravenous Cyclic-See Admin Instructions Randa Spike, RPH        Musculoskeletal: Strength & Muscle Tone: Laying in bed   Gait & Station: Laying in bed   Patient leans: Laying in bed      Psychiatric Specialty Exam: Psychiatric Specialty Exam: Physical Exam Vitals and nursing note reviewed.  Constitutional:      Appearance: Normal appearance. He is normal weight.  Skin:    General: Skin is warm and dry.     Capillary Refill: Capillary refill takes less than 2 seconds.  Neurological:     General: No focal deficit present.     Mental Status: He is alert and oriented to person, place, and time. Mental status is at baseline.  Psychiatric:        Attention and  Perception: Attention and perception normal.        Mood and Affect: Affect normal. Mood is anxious and depressed.        Speech: Speech normal.        Behavior: Behavior normal. Behavior is cooperative.        Thought Content: Thought content normal.        Cognition and Memory: Cognition normal.        Judgment: Judgment normal.     Review of Systems  Psychiatric/Behavioral:  Positive for depression and suicidal ideas (denies). Negative for hallucinations and substance abuse. The patient is nervous/anxious. The patient does not have insomnia.   All other systems reviewed and are negative.   Blood pressure (!) 119/52, pulse 71, temperature 97.8 F (36.6 C), temperature source Axillary, resp. rate 18, height 5' 10" (1.778 m), weight 70.6 kg, SpO2 96 %.Body mass index is 22.33 kg/m.  General Appearance: Fairly Groomed  Eye Contact:  Good  Speech:  Clear and Coherent and Normal Rate  Volume:  Normal  Mood:  Anxious  Affect:  Appropriate and Congruent  Thought Process:  Coherent, Goal Directed, Linear, and Descriptions of Associations: Intact  Orientation:  Full (Time, Place, and Person)  Thought Content:  Logical  Suicidal Thoughts:  No  Homicidal Thoughts:  No  Memory:  Immediate;   Fair Recent;   Fair  Judgement:  Fair  Insight:  Fair  Psychomotor Activity:  Normal  Concentration:  Concentration: Fair and Attention Span: Fair  Recall:  [employer] of Knowledge:  Good  Language:  Good  Akathisia:  No  Handed:  Right  AIMS (if indicated):     Assets:  Communication Skills Desire  for Improvement Financial Resources/Insurance Leisure Time Resilience Social Support  ADL's:  Intact  Cognition:  WNL  Sleep:         Physical Exam: Physical Exam Vitals and nursing note reviewed.  Constitutional:      Appearance: Normal appearance. He is normal weight.  Skin:    General: Skin is warm and dry.     Capillary Refill: Capillary refill takes less than 2 seconds.   Neurological:     General: No focal deficit present.     Mental Status: He is alert and oriented to person, place, and time. Mental status is at baseline.  Psychiatric:        Attention and Perception: Attention and perception normal.        Mood and Affect: Affect normal. Mood is anxious and depressed.        Speech: Speech normal.        Behavior: Behavior normal. Behavior is cooperative.        Thought Content: Thought content normal.        Cognition and Memory: Cognition normal.        Judgment: Judgment normal.    Review of Systems  Psychiatric/Behavioral:  Positive for depression and suicidal ideas (denies). Negative for hallucinations and substance abuse. The patient is nervous/anxious. The patient does not have insomnia.   All other systems reviewed and are negative.   Blood pressure (!) 119/52, pulse 71, temperature 97.8 F (36.6 C), temperature source Axillary, resp. rate 18, height 5' 10" (1.778 m), weight 70.6 kg, SpO2 96 %. Body mass index is 22.33 kg/m.  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 Continue with_0 referral to [facility], as patient has been difficult to place. -Continue sertraline 100 mg p.o. daily.  -Medical concerns were addressed with current hospitalist.  -Will attempt to make provisions for the patient to go outside once deemed safe and reasonable by physical therapy and or mobility specialist.  He will need to remain with safety sitter and additional staff.  Disposition: Recommend psychiatric Inpatient admission when medically cleared.  Suella Broad, FNP 06/07/2022 3:24 PM

## 2022-06-07 NOTE — Progress Notes (Signed)
PHARMACY - TOTAL PARENTERAL NUTRITION CONSULT NOTE   Indication: Crohn's disease and complex past surgical history including ex lap with small bowel resection, lysis of adhesion, mesh explantation, and diverting loop ileostomy in April 2023, on TPN prior to admission  Patient Measurements: Height: _0  (177.8 cm) Weight: 70.6 kg (155 lb 10.3 oz) IBW/kg (Calculated) : 73 TPN AdjBW (KG): 68.1 Body mass index is 22.33 kg/m.  Assessment: Patient with PMH of Crohn's disease and complex surgical history as above on chronic TPN who presented on 05/13/22 with altered mental status after spouse found patient unresponsive with multiple pill bottles open around him. Patient was transported from SNF to ED the day prior for generalized weakness and acute worsening of his chronic abdominal pain. Patient decided to return home instead of his SNF. Pharmacy consulted to resume TPN as inpatient.   Discharge from [place] 04/19/22:  TPN pharmacist at [place]:  Estimated Nutrient Requirements: Protein: 1.5 - 1.7 g/kg/day (98 - 104 g/day), Total kcal: 30 - 35 kcal/kg/day (1830 - 1983 kcal/day)  TPN Formulation at [place] per TPN pharmacist: [A 6.6%, D 20%, F 3.1%] @ goal rate, 65 mL/hr (TV: 1560 mL/day) provides: Protein 1.7 g/kg/day 103 g/day, Total kcal 32 kcal/kg/day 1971 kcal/day  NPC: N ratio 94   Admitted at [place] 04/29/22-05/03/22: Nutritional Goals: TPN 80 mL/hr (provides 113 g of protein and 1943 kcals per day) RD Assessment: (10/16): 2100-2450kcal (30-35kcal/kg), Protein 105-130g (1.5-1.8g/kg) Utilize intralipid rather than SMOF as 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  [facility]: [name], PharmD, ph: [phone] 10/28 Most recent formula: Total volume 1560 mL:  Dextrose 220 g/day, Plenamine 120 g/day, SMOFlipid 50 g/day, NaCl 75 mEq, CaGluc 6 mEq, Potassium Ac 20 mEq, Potassium Phos 13.5 nM, Mag Sulfate 8 mEq, Vitamins/trace  elements  Glucose / Insulin: No hx DM.  CBGs at goal <150. Electrolytes: lytes wnl including CorrCa  Note Bactrim started on 11/19, continue to monitor electrolytes. K increased to 4.4 Renal: SCr ok; BUN slightly elevated at 28; UOP remains adequate Hepatic:  - AST slightly above ULN but decreasing; ALT wnl, Alk phos remains slightly elevated  - Albumin low 2.6 - TG 84 (11/20) I/O: Net I/O -28 mL  - ileostomy output 600 mL, UOP ~ 1.5 L/day GI Imaging: - 10/14 CTa/p: previous surgical changes noted (transverse-sigmoid anastomosis) with stable inflammation since 10/12 CT - 10/27 CTa/p: no significant interval change since 10/14 CT. No evidence of bowel obstruction or other complicating feature at the anastomotic site or ostomy. - 11/6 abd [place] - normal liver (checked d/t elevated LFTs) - 11/20 CT: Same postoperative changes. Inflammatory change in the right lower quadrant is not significantly changed since 05/12/2022. New patchy opacity in the right lung base. GI Surgeries / Procedures: n/a  Central access: prior to admission - double lumen CVC TPN start date: started PTA  Nutritional Goals: TPN provides 131 g of protein and 2010 kcals per day.  RD Assessment this admission:    Estimated Needs Total Energy Estimated Needs: 2015-2350kcal Total Protein Estimated Needs: 100-135g Total Fluid Estimated Needs: 2468m (Holliday Segar)  Current Nutrition:  TPN Dysphagia-3 11/9 >> Regular 11/17 >>  Dysphagia 3 115/72>>   Plan:  Cyclic TPN with 26203ml over 14 hrs GIR 2.73 - 5.45  Electrolytes in TPN:  Na 75 mEq/L K 40 mEq/L Ca 5 mEq/L Mg 5 mEq/L Phos 15 mmol/L Cl:Ac = 1:1 Add standard MVI and  trace elements to TPN  With excellent CBG control, will limit finger sticks to twice daily.  Timed 2 hrs after TPN start, 1 hour after TPN stop  IVF dc'd by MD Monitor TPN labs on Mon/Thurs at minimum  [facility] PharmD, BCPS [facility] main pharmacy [phone] 06/07/2022 7:07 AM

## 2022-06-07 NOTE — Progress Notes (Addendum)
Modified Barium Swallow Progress Note  Patient Details  Name: [name] MRN: [id number] Date of Birth: December 28, 1950  Today's Date: 06/07/2022  Modified Barium Swallow completed.  Full report located under Chart Review in the Imaging Section.  Brief recommendations include the following:  Clinical Impression  Patient continues with mild oral and moderate pharyngeal phase dysphagia with concern for sensorimotor deficits.  Swallow ability consistent with prior MBS.  Today t was assessed with thin, nectar, puree and soft solid. During oral phase, patient exhibited decreased anterior to posterior transit of boluses which was mostly noted with thicker liquids (honey thick, puree/pudding) resulting in delay and oral retention. Minimal amount of barium retained in oral cavity post initial swallow and then spilled into pharynx after the swallow without consistent reflexive swallow response or delayed response.   During pharyngeal phase of swallow, patient exhibited worsened delay in swallow trigger to pyriform sinus at worst with nectar and thin liquids.  Vallecular retention -mod to severe- noted as well as minimal liquid retention in lateral channels and pyriform sinus WITHOUT pt sensation.    He exhibited aspiration during the swallow (PAS 7) with cup sip of thin liquids and with patient reacting with a cough response that did not clear aspirates.  Various postures including chin tuck with and without head turn right/left and cued expectorate were not helpful to mitigate retention/aspiration. In addition, pt with very limited neck ROM.  With teaspoon sips of thin liquids, patient did not exhibit any instances of penetration or aspiration during or after the swallow, but did continue with pharyngeal residuals. Penetration and aspiration of thin liquid was inconsistent - thus recommend conservative diet at this time.    SLP is recommending to modify diet to dys3/nectar and allow tsps of thin liquid any  time. [place]ier water protocol advised to help pt with hydration given his colectomy.  Pt should be fully upright for all intake - but he is not sitting fully up with meals despite educated to importance.    SLP will continue to follow patient for diet toleration and strengthening of pharyngeal musculature to maximize swallow rehab.   Pt denies significant issues with swallowing prior to admit - and now reports awareness to his changes with swallow function.  He only took very small bites/sips during testing - unless cued to larger bolus - and his po nutrition has remained poor despite dietary advancement.    SLP is concern that his deconditioning from his lengthy hospital coarse is diminishing his tolerance of likely low grade chronic aspiration.  Educated pt thoroughly during MBS to recommendations and provided written instructions.  Upon esophageal sweep, pt appeared with barium stasis of liquids  with appearance of minimal backflow.  Pudding bolus aided clearance of liquid into stomach. Suspect component of dysmotility.  Radiologist not present to confirm and MBS does not diagnose below the UES.  No backflow of barium through UES as seen on prior MBS.    Swallow Evaluation Recommendations       SLP Diet Recommendations: Dysphagia 3 (Mech soft) solids;No mixed consistencies;Nectar thick liquid;Free water protocol after oral care (thin ok via tsp only, thin water ok between meals via cup)   Liquid Administration via: Spoon;Cup;No straw   Medication Administration:  (crushed with applesauce)   Supervision: Full supervision/cueing for compensatory strategies;Staff to assist with self feeding   Compensations: Slow rate;Small sips/bites;Other (Comment) (intermittent dry swallow)   Postural Changes: Remain semi-upright after after feeds/meals (Comment);Seated upright at 90 degrees   Oral Care  Recommendations: Oral care BID;Staff/trained caregiver to provide oral care;Oral care before and after  PO     Kathleen Lime, MS [facility] SLP Acute Rehab Services Office [phone] Pager [phone]     Macario Golds 06/07/2022,10:20 AM

## 2022-06-07 NOTE — Progress Notes (Signed)
Physical Therapy Treatment Patient Details Name: [name] MRN: [id number] DOB: 04-Oct-1950 Today's Date: 06/07/2022   History of Present Illness 71 year old male with complicated past medical history as outlined below, which is significant for Crohn's disease with persistent abdominal pain.  On April of this year he underwent exploratory laparotomy with small bowel resection, repair of enterotomy, and creation of a diverting loop ileostomy at [facility].  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  He has been residing in [facility] SNF from where he presented to the emergency department on 10/27 for pain at the site of his ostomy with foul-smelling discharge.  Work-up of the ostomy and abdomen including CT imaging was unremarkable.  He was discharged to SNF, however, rather than returning to SNF he found private transport to his home.Then on 10/28 he again presented to [facility] emergency department with chief complaint of unresponsiveness.11/2 reveals that ingestion of pills at home was in fact a suicide attempt. 1:1 sitter, psych consult    PT Comments    Progressing with PT\; much improved activity tolerance,  decr assist required overall and decr cues to self assist and stay on task. Continue to current POC, recommend SNF post acute. Will update plan accordingly as [name] continues to make gains.   Recommendations for follow up therapy are one component of a multi-disciplinary discharge planning process, led by the attending physician.  Recommendations may be updated based on patient status, additional functional criteria and insurance authorization.  Follow Up Recommendations  Skilled nursing-short term rehab (<3 hours/day) Can patient physically be transported by private vehicle: Yes   Assistance Recommended at Discharge Frequent or constant Supervision/Assistance  Patient can return home with the following A little help with walking and/or transfers;A little help with  bathing/dressing/bathroom;Assistance with cooking/housework;Assist for transportation;Help with stairs or ramp for entrance;Direct supervision/assist for medications management   Equipment Recommendations  None recommended by PT    Recommendations for Other Services       Precautions / Restrictions Precautions Precautions: Fall Precaution Comments: ostomy Restrictions Weight Bearing Restrictions: No     Mobility  Bed Mobility Overal bed mobility: Needs Assistance Bed Mobility: Supine to Sit     Supine to sit: Min guard     General bed mobility comments: improved ability to self assist, verbal and visual cues to perform partial roll and reach for rail. HOB elevated ~ 30 degrees    Transfers Overall transfer level: Needs assistance Equipment used: Rolling walker (2 wheels) Transfers: Sit to/from Stand Sit to Stand: Min guard           General transfer comment: verbal cues for hand placement and to control descent; repeated STS for activity tol and to adjust RW ht    Ambulation/Gait Ambulation/Gait assistance: Min assist, Min guard Gait Distance (Feet): 70 Feet (25' +45') Assistive device: Rolling walker (2 wheels) Gait Pattern/deviations: Step-through pattern, Decreased stride length, Trunk flexed, Narrow base of support       General Gait Details: verbal cues to incr stride length, for trunk extension. improved gait stability, intermittent assist to guide RW   Stairs             Wheelchair Mobility    Modified Rankin (Stroke Patients Only)       Balance   Sitting-balance support: Feet supported, Bilateral upper extremity supported Sitting balance-Leahy Scale: Good     Standing balance support: Bilateral upper extremity supported, Reliant on assistive device for balance Standing balance-Leahy Scale: Poor (initial posterior lean, corrects with  cues)                              Cognition Arousal/Alertness: Awake/alert Behavior  During Therapy: WFL for tasks assessed/performed                       Current Attention Level: Focused   Following Commands: Follows one step commands with increased time, Follows multi-step commands inconsistently       General Comments: A&O x3 this date, remains easily distracted and requires redirection to task, requies incr time to complete thoughts/express needs        Exercises      General Comments        Pertinent Vitals/Pain Pain Assessment Pain Assessment: No/denies pain    Home Living                          Prior Function            PT Goals (current goals can now be found in the care plan section) Acute Rehab PT Goals Patient Stated Goal: to get better PT Goal Formulation: With patient/family Time For Goal Achievement: 06/15/22 Potential to Achieve Goals: Good Progress towards PT goals: Progressing toward goals    Frequency    Min 2X/week      PT Plan Current plan remains appropriate    Co-evaluation              AM-PAC PT "6 Clicks" Mobility   Outcome Measure  Help needed turning from your back to your side while in a flat bed without using bedrails?: A Little Help needed moving from lying on your back to sitting on the side of a flat bed without using bedrails?: A Little Help needed moving to and from a bed to a chair (including a wheelchair)?: A Little Help needed standing up from a chair using your arms (e.g., wheelchair or bedside chair)?: A Little Help needed to walk in hospital room?: A Little Help needed climbing 3-5 steps with a railing? : A Lot 6 Click Score: 17    End of Session Equipment Utilized During Treatment: Gait belt Activity Tolerance: Patient tolerated treatment well Patient left: in chair;with call bell/phone within reach;with family/visitor present;with nursing/sitter in room Nurse Communication: Mobility status PT Visit Diagnosis: Difficulty in walking, not elsewhere classified (R26.2)      Time: 9735-3299 PT Time Calculation (min) (ACUTE ONLY): 20 min  Charges:  $Gait Training: 8-22 mins                     Baxter Flattery, PT  Acute Rehab Dept [facility]) [phone]  [facility] Weekend Pager (Saturday/Sunday only)  [phone]  06/07/2022    [facility] 06/07/2022, 2:26 PM

## 2022-06-07 NOTE — Progress Notes (Signed)
Progress Note   Patient: [name] QQI:[id number] DOB: [birth year] DOA: 05/13/2022     25 DOS: the patient was seen and examined on 06/07/2022   Brief hospital course: Mr. Azam was admitted to the hospital with the working diagnosis of intentional overdose, suicidal attempt. Now pending transfer to inpatient psychiatry.   [phone] with hx Crohn's with persistent abd pain s/p extensive surgeries and is currently TPN dependent. Pt presented with unresponsiveness in apparent suicide attempt via overdose. Pt remained unresponsive and later was unable to maintain airway, prompting intubation and transfer to ICU. Pt now extubated and care transferred to hospitalist.  Psychiatry has recommended patient to continue with suicidal precautions and transfer to inpatient psychiatry unit.   Intentional drug overdose. Suicide ideation. MDD. Patient presented with an unresponsive event at home. Noted to have intentionally taken pain medication and anxiolytics with intention for suicide. Psychiatry was consulted. Recommended inpatient psychiatric admission. Patient currently medically stable although unable to find a place that can accommodate TPN. Referred to [facility].  Accepted.  Awaiting a bed   Acute toxic and metabolic encephalopathy. Likely combination of medication overdose as well as hypoxia from pneumonia. Head CT and MRI brain negative for any acute abnormality. Today patient with no confusion, able to responds to questions appropriately and follow commands.  Aspiration pneumonia. Dysphagia. Completed IV antibiotic therapy. CT on 11/20 shows evidence of new evidence of right lower lobe infiltrate. Possibility of an atelectasis cannot be ruled out but more most likely this is pneumonitis. Procalcitonin level is negative.  Continue to monitor. Continue with Incentive spirometer Pneumonia has resolved.    Dysphagia. Speech therapy following the patient. MBS was performed and patient was  advanced to dysphagia 3 diet followed by advancing to regular diet. CT chest on 11/21 shows evidence of right lower lung infiltrate. Speech therapy consulted again.  Now back on D3 diet.   Continue with aspiration precautions, continue at high risk for aspiration      Crohn's disease. Patient had multiple surgeries in the past.currently not on any medical therapy. 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 Management per pharmacy.   Severe protein calorie malnutrition. Body mass index is 21.54 kg/m.  Nutrition Problem: Severe Malnutrition Etiology: chronic illness Interventions: TPN (increase nutrients) Continue the same.   Chronic prostatitis. Bladder calculi. BPH. Seen by [name] on 10/15. Patient is on Bactrim for 28 days since then although missed some doses while he was intubated. Currently based on my discussion with urology on 11/15 we will continue total 14-day treatment course for Bactrim. Patient had a Foley catheter placed in the ICU most likely due to critical condition. Patient is on Flomax also. Foley removed on 11/16.  Patient able to void after the removal. Discontinue oxybutynin as that can cause retention and increased PVR. Start on finasteride as well. Patient will follow-up with [name] for outpatient procedure for BPH.   Ileostomy leakage with irritation and dermatitis.. Wound care consulted. Currently being treated with stoma powder and frequent emptying. Stoma appears healthy and pink.  No surrounding skin cellulitis seen.  Patient is following up with [name].  Patient had a PICC line placed in September 24 with the plan to continue TPN until the patient gains 30 to 40 pounds and then consider ileostomy closure. Patient was 130 pound at the placement of the PICC line. I discussed with [name] on 11/21.  I have informed her with regards to current ileostomy which appears to be  healthy, as well as inform  her with regards to weight gain.  Her primary concern is patient's need for weight gain as well as rehabilitation before considering any surgical intervention.  She currently agrees with the plan to go to [facility] or any inpatient psychiatric where the patient continues to have rehabilitation as well. Her main concern is patient should go to rehab to regain strength.  COVID exposure. Patient's wife and son both got tested positive on 11/15 for COVID-19. Family has been visiting the patient throughout the hospital stay. COVID-19 negative.  Currently no indication for isolation or repeating another COVID test.   Chronic pelvic pain. Patient continues to report worsening of his pelvic pain. X-ray hip and pelvis is unremarkable for any acute etiology. Patient had a CT abdomen and pelvis as well as CT chest angio abdomen and pelvis earlier last month during this hospital stay which were unremarkable. Patient is requiring IV Dilaudid. Patient reported to the same to psychiatry that the pain is actually causing him anxiety. Repeat CT abdomen pelvis on 06/05/2022 is negative for any acute abnormality to explain patient's pelvic pain.  Does have some chronic inflammation which is unchanged. Patient was informed that he does not require any IV narcotics as there is no acute fracture or ruptured viscus or any other acute abnormality. Patient told me that discussion with regards to transitioning from narcotics to other kind of medicine was associated with his suicidal attempt.   Transient A-fib. Resolved. Monitor.  Assessment and Plan: No notes have been filed under this hospital service. Service: Hospitalist       Subjective: Patient with no chest pain or dyspnea, continue very weak and deconditioned   Physical Exam: Vitals:   06/06/22 1402 06/06/22 1952 06/07/22 0623 06/07/22 1652  BP:  (!) 113/59 (!) 119/52 114/62  Pulse:  63 71 63  Resp:  18 18 17   Temp:  (!) 97.5 F (36.4 C) 97.8 F (36.6  C) 98.3 F (36.8 C)  TempSrc:  Axillary Axillary Axillary  SpO2:  98% 96% 100%  Weight: 70.6 kg     Height:       Neurology awake and alert ENT with no pallor Cardiovascular with S1 and S2 present and rhythmic Respiratory with no rales or wheezing Abdomen with no distention  No lower extremity edema  Data Reviewed:    Family Communication: I spoke with patient's wife at the bedside, we talked in detail about patient's condition, plan of care and prognosis and all questions were addressed.    Disposition: Status is: Inpatient Remains inpatient appropriate because: pending transfer to inpatient psych   Planned Discharge Destination:  inpatient psych      Author: Tawni Millers, MD 06/07/2022 5:44 PM  For on call review [email or web address].

## 2022-06-08 DIAGNOSIS — R531 Weakness: Secondary | ICD-10-CM | POA: Diagnosis not present

## 2022-06-08 DIAGNOSIS — E43 Unspecified severe protein-calorie malnutrition: Secondary | ICD-10-CM | POA: Diagnosis not present

## 2022-06-08 DIAGNOSIS — G9341 Metabolic encephalopathy: Secondary | ICD-10-CM | POA: Diagnosis not present

## 2022-06-08 DIAGNOSIS — J9601 Acute respiratory failure with hypoxia: Secondary | ICD-10-CM | POA: Diagnosis not present

## 2022-06-08 LAB — PHOSPHORUS: Phosphorus: 3.1 mg/dL (ref 2.5–4.6)

## 2022-06-08 LAB — COMPREHENSIVE METABOLIC PANEL
ALT: 40 U/L (ref 0–44)
AST: 40 U/L (ref 15–41)
Albumin: 2.7 g/dL — ABNORMAL LOW (ref 3.5–5.0)
Alkaline Phosphatase: 156 U/L — ABNORMAL HIGH (ref 38–126)
Anion gap: 6 (ref 5–15)
BUN: 23 mg/dL (ref 8–23)
CO2: 24 mmol/L (ref 22–32)
Calcium: 8.2 mg/dL — ABNORMAL LOW (ref 8.9–10.3)
Chloride: 106 mmol/L (ref 98–111)
Creatinine, Ser: 0.94 mg/dL (ref 0.61–1.24)
GFR, Estimated: >60 mL/min (ref >60)
Glucose, Bld: 93 mg/dL (ref 70–99)
Potassium: 4 mmol/L (ref 3.5–5.1)
Sodium: 136 mmol/L (ref 135–145)
Total Bilirubin: 0.3 mg/dL (ref 0.3–1.2)
Total Protein: 6.4 g/dL — ABNORMAL LOW (ref 6.5–8.1)

## 2022-06-08 LAB — PROCALCITONIN: Procalcitonin: <0.1 ng/mL

## 2022-06-08 LAB — MAGNESIUM: Magnesium: 2 mg/dL (ref 1.7–2.4)

## 2022-06-08 LAB — GLUCOSE, CAPILLARY
Glucose-Capillary: 104 mg/dL — ABNORMAL HIGH (ref 70–99)
Glucose-Capillary: 106 mg/dL — ABNORMAL HIGH (ref 70–99)

## 2022-06-08 LAB — TRIGLYCERIDES: Triglycerides: 109 mg/dL (ref <150)

## 2022-06-08 MED ORDER — ACETAMINOPHEN 325 MG PO TABS
650.0000 mg | ORAL_TABLET | Freq: Four times a day (QID) | ORAL | Status: DC | PRN
  Administered 2022-06-14 – 2022-06-23 (×4): 650 mg via ORAL
  Filled 2022-06-08 (×4): qty 2

## 2022-06-08 MED ORDER — TRAVASOL 10 % IV SOLN
INTRAVENOUS | Status: AC
  Filled 2022-06-08: qty 1312.5

## 2022-06-08 NOTE — Progress Notes (Signed)
PHARMACY - TOTAL PARENTERAL NUTRITION CONSULT NOTE   Indication: Crohn's disease and complex past surgical history including ex lap with small bowel resection, lysis of adhesion, mesh explantation, and diverting loop ileostomy in April 2023, on TPN prior to admission  Patient Measurements: Height: _0  (177.8 cm) Weight: 70.6 kg (155 lb 10.3 oz) IBW/kg (Calculated) : 73 TPN AdjBW (KG): 68.1 Body mass index is 22.33 kg/m.  Assessment: Patient with PMH of Crohn's disease and complex surgical history as above on chronic TPN who presented on 05/13/22 with altered mental status after spouse found patient unresponsive with multiple pill bottles open around him. Patient was transported from SNF to ED the day prior for generalized weakness and acute worsening of his chronic abdominal pain. Patient decided to return home instead of his SNF. Pharmacy consulted to resume TPN as inpatient.   Discharge from [place] 04/19/22:  TPN pharmacist at [facility]:  Estimated Nutrient Requirements: Protein: 1.5 - 1.7 g/kg/day (98 - 104 g/day), Total kcal: 30 - 35 kcal/kg/day (1830 - 1983 kcal/day)  TPN Formulation at [place] per TPN pharmacist: [A 6.6%, D 20%, F 3.1%] @ goal rate, 65 mL/hr (TV: 1560 mL/day) provides: Protein 1.7 g/kg/day 103 g/day, Total kcal 32 kcal/kg/day 1971 kcal/day  NPC: N ratio 94   Admitted at [facility] 04/29/22-05/03/22: Nutritional Goals: TPN 80 mL/hr (provides 113 g of protein and 1943 kcals per day) RD Assessment: (10/16): 2100-2450kcal (30-35kcal/kg), Protein 105-130g (1.5-1.8g/kg) Utilize intralipid rather than SMOF as 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  [facility]: [name], PharmD, ph: [phone] 10/28 Most recent formula: Total volume 1560 mL:  Dextrose 220 g/day, Plenamine 120 g/day, SMOFlipid 50 g/day, NaCl 75 mEq, CaGluc 6 mEq, Potassium Ac 20 mEq, Potassium Phos 13.5 nM, Mag Sulfate 8 mEq, Vitamins/trace  elements  Glucose / Insulin: No hx DM.  CBGs at goal <150. Electrolytes: Lytes wnl including CorrCa  Note Bactrim started on 11/19, continue to monitor electrolytes. Renal: SCr ok; BUN slightly elevated at 28; UOP remains adequate Hepatic: AST/ALT returned to WNL, Alk phos remains slightly elevated  - Albumin low 2.7 - TG 109 (11/23) I/O: Net I/O -28 mL  - ileostomy output 600 mL, UOP ~ 1.5 L/day GI Imaging: - 10/14 CTa/p: previous surgical changes noted (transverse-sigmoid anastomosis) with stable inflammation since 10/12 CT - 10/27 CTa/p: no significant interval change since 10/14 CT. No evidence of bowel obstruction or other complicating feature at the anastomotic site or ostomy. - 11/6 abd [place] - normal liver (checked d/t elevated LFTs) - 11/20 CT: Same postoperative changes. Inflammatory change in the right lower quadrant is not significantly changed since 05/12/2022. New patchy opacity in the right lung base. GI Surgeries / Procedures: n/a  Central access: prior to admission - double lumen CVC TPN start date: started PTA  Nutritional Goals: TPN provides 131 g of protein and 2010 kcals per day.  RD Assessment this admission:    Estimated Needs Total Energy Estimated Needs: 2015-2350kcal Total Protein Estimated Needs: 100-135g Total Fluid Estimated Needs: 2410m (Holliday Segar)  Current Nutrition:  TPN Dysphagia-3 11/9 >> Regular 11/17 >>  Dysphagia 3 189/16>>   Plan:  Cyclic TPN with 29450ml over 14 hrs GIR 2.73 - 5.45  Electrolytes in TPN:  Na 75 mEq/L K 40 mEq/L Ca 5 mEq/L Mg 5 mEq/L Phos 15 mmol/L Cl:Ac = 1:1 Add standard MVI and trace elements to TPN  With excellent CBG control, will  limit finger sticks to twice daily.  Timed 2 hrs after TPN start, 1 hour after TPN stop  IVF dc'd by MD Monitor TPN labs on Mon/Thurs at minimum  [facility] PharmD, BCPS [facility] main pharmacy [phone] 06/08/2022 7:03 AM

## 2022-06-08 NOTE — Progress Notes (Signed)
Progress Note   Patient: [name] YWV:[id number] DOB: [birth year] DOA: 05/13/2022     26 DOS: the patient was seen and examined on 06/08/2022   Brief hospital course: [name] was admitted to the hospital with the working diagnosis of intentional overdose, suicidal attempt. Now pending transfer to inpatient psychiatry.   [phone] with hx Crohn's with persistent abd pain s/p extensive surgeries and is currently TPN dependent. Pt presented with unresponsiveness in apparent suicide attempt via overdose. Pt remained unresponsive and later was unable to maintain airway, prompting intubation and transfer to ICU. Pt now extubated and care transferred to hospitalist.  Psychiatry has recommended patient to continue with suicidal precautions and transfer to inpatient psychiatry unit.   Intentional drug overdose. Suicide ideation. MDD. Patient presented with an unresponsive event at home. Noted to have intentionally taken pain medication and anxiolytics with intention for suicide. Psychiatry was consulted. Recommended inpatient psychiatric admission. Patient currently medically stable although unable to find a place that can accommodate TPN. Referred to [facility].  Accepted.  Awaiting a bed   Acute toxic and metabolic encephalopathy. Likely combination of medication overdose as well as hypoxia from pneumonia. Head CT and MRI brain negative for any acute abnormality. Today patient with no confusion, able to responds to questions appropriately and follow commands.  Aspiration pneumonia. Dysphagia. Completed IV antibiotic therapy. CT on 11/20 shows evidence of new evidence of right lower lobe infiltrate. Possibility of an atelectasis cannot be ruled out but more most likely this is pneumonitis. Procalcitonin level is negative.  Continue to monitor. Continue with Incentive spirometer Pneumonia has resolved.    Dysphagia. Speech therapy following the patient. MBS was performed and patient was  advanced to dysphagia 3 diet followed by advancing to regular diet. CT chest on 11/21 shows evidence of right lower lung infiltrate. Speech therapy consulted again.  Now back on D3 diet.   Continue with aspiration precautions, continue at high risk for aspiration      Crohn's disease. Patient had multiple surgeries in the past.currently not on any medical therapy. 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 Management per pharmacy.   Severe protein calorie malnutrition. Body mass index is 21.54 kg/m.  Nutrition Problem: Severe Malnutrition Etiology: chronic illness Interventions: TPN (increase nutrients) Continue the same.   Chronic prostatitis. Bladder calculi. BPH. Seen by [name] on 10/15. Patient is on Bactrim for 28 days since then although missed some doses while he was intubated. Currently based on my discussion with urology on 11/15 we will continue total 14-day treatment course for Bactrim. Patient had a Foley catheter placed in the ICU most likely due to critical condition. Patient is on Flomax also. Foley removed on 11/16.  Patient able to void after the removal. Discontinue oxybutynin as that can cause retention and increased PVR. Start on finasteride as well. Patient will follow-up with [name] for outpatient procedure for BPH.   Ileostomy leakage with irritation and dermatitis.. Wound care consulted. Currently being treated with stoma powder and frequent emptying. Stoma appears healthy and pink.  No surrounding skin cellulitis seen.  Patient is following up [name].  Patient had a PICC line placed in September 24 with the plan to continue TPN until the patient gains 30 to 40 pounds and then consider ileostomy closure. Patient was 130 pound at the placement of the PICC line. I discussed with [name] on 11/21.  I have informed her with regards to current ileostomy which appears to be  healthy, as well as inform  her with regards to weight gain.  Her primary concern is patient's need for weight gain as well as rehabilitation before considering any surgical intervention.  She currently agrees with the plan to go to [facility] or any inpatient psychiatric where the patient continues to have rehabilitation as well. Her main concern is patient should go to rehab to regain strength.  COVID exposure. Patient's wife and son both got tested positive on 11/15 for COVID-19. Family has been visiting the patient throughout the hospital stay. COVID-19 negative.  Currently no indication for isolation or repeating another COVID test.   Chronic pelvic pain. Patient continues to report worsening of his pelvic pain. X-ray hip and pelvis is unremarkable for any acute etiology. Patient had a CT abdomen and pelvis as well as CT chest angio abdomen and pelvis earlier last month during this hospital stay which were unremarkable. Patient is requiring IV Dilaudid. Patient reported to the same to psychiatry that the pain is actually causing him anxiety. Repeat CT abdomen pelvis on 06/05/2022 is negative for any acute abnormality to explain patient's pelvic pain.  Does have some chronic inflammation which is unchanged. Patient was informed that he does not require any IV narcotics as there is no acute fracture or ruptured viscus or any other acute abnormality. Patient told me that discussion with regards to transitioning from narcotics to other kind of medicine was associated with his suicidal attempt.   Transient A-fib. Resolved. Monitor.  Assessment and Plan: No notes have been filed under this hospital service. Service: Hospitalist       Subjective: Patient with no chest pain or dyspnea, today with anxiety but not agitation, his wife is at the bedside   Physical Exam: Vitals:   06/07/22 1652 06/07/22 2009 06/08/22 0431 06/08/22 1049  BP: 114/62 102/63 113/64 117/66  Pulse: 63 (!) 59 64 61  Resp: 17 16 18 16   Temp:  98.3 F (36.8 C) 98.3 F (36.8 C) 97.7 F (36.5 C) (!) 97.5 F (36.4 C)  TempSrc: Axillary Oral Oral Oral  SpO2: 100% 99% 98% 100%  Weight:      Height:       Neurology awake and alert ENT with mild pallor Cardiovascular with S1 and S2 present  Respiratory with no wheezing Abdomen with no distention No lower extremity edema   Data Reviewed:    Family Communication: I spoke with patient's wife at the bedside, we talked in detail about patient's condition, plan of care and prognosis and all questions were addressed.   Disposition: Status is: Inpatient Remains inpatient appropriate because: pending placement in inpatient psych   Planned Discharge Destination:  inpatient psychiatry       Author: Tawni Millers, MD 06/08/2022 2:58 PM  For on call review [email or web address].

## 2022-06-08 NOTE — Plan of Care (Signed)
  Problem: Education: Goal: Knowledge of General Education information will improve Description: Including pain rating scale, medication(s)/side effects and non-pharmacologic comfort measures Outcome: Progressing   Problem: Health Behavior/Discharge Planning: Goal: Ability to manage health-related needs will improve Outcome: Progressing   Problem: Clinical Measurements: Goal: Ability to maintain clinical measurements within normal limits will improve Outcome: Progressing   Problem: Coping: Goal: Level of anxiety will decrease Outcome: Progressing   Problem: Elimination: Goal: Will not experience complications related to urinary retention Outcome: Progressing   Problem: Pain Managment: Goal: General experience of comfort will improve Outcome: Progressing   Problem: Safety: Goal: Ability to remain free from injury will improve Outcome: Progressing   Problem: Skin Integrity: Goal: Risk for impaired skin integrity will decrease Outcome: Progressing

## 2022-06-09 DIAGNOSIS — R531 Weakness: Secondary | ICD-10-CM | POA: Diagnosis not present

## 2022-06-09 DIAGNOSIS — G9341 Metabolic encephalopathy: Secondary | ICD-10-CM | POA: Diagnosis not present

## 2022-06-09 DIAGNOSIS — G894 Chronic pain syndrome: Secondary | ICD-10-CM | POA: Diagnosis not present

## 2022-06-09 DIAGNOSIS — T50902A Poisoning by unspecified drugs, medicaments and biological substances, intentional self-harm, initial encounter: Secondary | ICD-10-CM | POA: Diagnosis not present

## 2022-06-09 LAB — COMPREHENSIVE METABOLIC PANEL
ALT: 37 U/L (ref 0–44)
AST: 37 U/L (ref 15–41)
Albumin: 2.8 g/dL — ABNORMAL LOW (ref 3.5–5.0)
Alkaline Phosphatase: 155 U/L — ABNORMAL HIGH (ref 38–126)
Anion gap: 5 (ref 5–15)
BUN: 28 mg/dL — ABNORMAL HIGH (ref 8–23)
CO2: 25 mmol/L (ref 22–32)
Calcium: 8.4 mg/dL — ABNORMAL LOW (ref 8.9–10.3)
Chloride: 109 mmol/L (ref 98–111)
Creatinine, Ser: 0.87 mg/dL (ref 0.61–1.24)
GFR, Estimated: >60 mL/min (ref >60)
Glucose, Bld: 110 mg/dL — ABNORMAL HIGH (ref 70–99)
Potassium: 4.3 mmol/L (ref 3.5–5.1)
Sodium: 139 mmol/L (ref 135–145)
Total Bilirubin: 0.4 mg/dL (ref 0.3–1.2)
Total Protein: 6.4 g/dL — ABNORMAL LOW (ref 6.5–8.1)

## 2022-06-09 LAB — GLUCOSE, CAPILLARY
Glucose-Capillary: 93 mg/dL (ref 70–99)
Glucose-Capillary: 113 mg/dL — ABNORMAL HIGH (ref 70–99)

## 2022-06-09 MED ORDER — HYDROXYZINE HCL 25 MG PO TABS
25.0000 mg | ORAL_TABLET | Freq: Three times a day (TID) | ORAL | Status: DC | PRN
  Administered 2022-06-09 – 2022-06-22 (×28): 25 mg via ORAL
  Filled 2022-06-09 (×32): qty 1

## 2022-06-09 MED ORDER — TRAVASOL 10 % IV SOLN
INTRAVENOUS | Status: AC
  Filled 2022-06-09: qty 1312.5

## 2022-06-09 MED ORDER — ENSURE ENLIVE PO LIQD
237.0000 mL | ORAL | Status: DC
  Administered 2022-06-09 – 2022-06-22 (×11): 237 mL via ORAL

## 2022-06-09 NOTE — Progress Notes (Signed)
PHARMACY - TOTAL PARENTERAL NUTRITION CONSULT NOTE   Indication: Crohn's disease and complex past surgical history including ex lap with small bowel resection, lysis of adhesion, mesh explantation, and diverting loop ileostomy in April 2023, on TPN prior to admission  Patient Measurements: Height: _0  (177.8 cm) Weight: 70.6 kg (155 lb 10.3 oz) IBW/kg (Calculated) : 73 TPN AdjBW (KG): 68.1 Body mass index is 22.33 kg/m.  Assessment: Patient with PMH of Crohn's disease and complex surgical history as above on chronic TPN who presented on 05/13/22 with altered mental status after spouse found patient unresponsive with multiple pill bottles open around him. Patient was transported from SNF to ED the day prior for generalized weakness and acute worsening of his chronic abdominal pain. Patient decided to return home instead of his SNF. Pharmacy consulted to resume TPN as inpatient.   Discharge from [facility] 04/19/22:  TPN pharmacist at [facility]:  Estimated Nutrient Requirements: Protein: 1.5 - 1.7 g/kg/day (98 - 104 g/day), Total kcal: 30 - 35 kcal/kg/day (1830 - 1983 kcal/day)  TPN Formulation at [facility] per TPN pharmacist: [A 6.6%, D 20%, F 3.1%] @ goal rate, 65 mL/hr (TV: 1560 mL/day) provides: Protein 1.7 g/kg/day 103 g/day, Total kcal 32 kcal/kg/day 1971 kcal/day  NPC: N ratio 94   Admitted at [facility] 04/29/22-05/03/22: Nutritional Goals: TPN 80 mL/hr (provides 113 g of protein and 1943 kcals per day) RD Assessment: (10/16): 2100-2450kcal (30-35kcal/kg), Protein 105-130g (1.5-1.8g/kg) Utilize intralipid rather than SMOF as 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  [facility]: [name], PharmD, ph: [phone] 10/28 Most recent formula: Total volume 1560 mL:  Dextrose 220 g/day, Plenamine 120 g/day, SMOFlipid 50 g/day, NaCl 75 mEq, CaGluc 6 mEq, Potassium Ac 20 mEq, Potassium Phos 13.5 nM, Mag Sulfate 8 mEq, Vitamins/trace  elements  Glucose / Insulin: No hx DM.  CBGs at goal <150. Electrolytes: Lytes wnl including CorrCa  Renal: SCr ok; BUN slightly elevated at 28; UOP remains adequate Hepatic: AST/ALT returned to WNL, Alk phos remains slightly elevated  - Albumin low 2.8 - TG 109 (11/23) I/O: Net I/O 412 mL  - ileostomy output 585 mL, UOP ~ 1.5 L/day GI Imaging: - 10/14 CTa/p: previous surgical changes noted (transverse-sigmoid anastomosis) with stable inflammation since 10/12 CT - 10/27 CTa/p: no significant interval change since 10/14 CT. No evidence of bowel obstruction or other complicating feature at the anastomotic site or ostomy. - 11/6 abd [place] - normal liver (checked d/t elevated LFTs) - 11/20 CT: Same postoperative changes. Inflammatory change in the right lower quadrant is not significantly changed since 05/12/2022. New patchy opacity in the right lung base. GI Surgeries / Procedures: n/a  Central access: prior to admission - double lumen CVC TPN start date: started PTA  Nutritional Goals: TPN provides 131 g of protein and 2010 kcals per day.  RD Assessment this admission:    Estimated Needs Total Energy Estimated Needs: 2015-2350kcal Total Protein Estimated Needs: 100-135g Total Fluid Estimated Needs: 2462m (Holliday Segar)  Current Nutrition:  TPN Dysphagia-3 11/9 >> Regular 11/17 >>  Dysphagia 3 103/50>>   Plan:  Cyclic TPN with 20938ml over 14 hrs GIR 2.73 - 5.45  Electrolytes in TPN:  Na 75 mEq/L K 40 mEq/L Ca 5 mEq/L Mg 5 mEq/L Phos 15 mmol/L Cl:Ac = 1:1 Add standard MVI and trace elements to TPN  With excellent CBG control, will limit finger sticks to twice daily.  Timed 2  hrs after TPN start, 1 hour after TPN stop  IVF dc'd by MD BMP, magnesium, phosphorus with AM labs  Monitor TPN labs on Mon/Thurs at minimum  Royetta Asal, PharmD, BCPS 06/09/2022 9:15 AM

## 2022-06-09 NOTE — Progress Notes (Signed)
Physical Therapy Treatment Patient Details Name: [name] MRN: [id number] DOB: 1950/10/13 Today's Date: 06/09/2022   History of Present Illness 71 year old male with complicated past medical history as outlined below, which is significant for Crohn's disease with persistent abdominal pain.  On April of this year he underwent exploratory laparotomy with small bowel resection, repair of enterotomy, and creation of a diverting loop ileostomy at [facility].  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  He has been residing in [facility] SNF from where he presented to the emergency department on 10/27 for pain at the site of his ostomy with foul-smelling discharge.  Work-up of the ostomy and abdomen including CT imaging was unremarkable.  He was discharged to SNF, however, rather than returning to SNF he found private transport to his home.Then on 10/28 he again presented to [facility] emergency department with chief complaint of unresponsiveness.11/2 reveals that ingestion of pills at home was in fact a suicide attempt. 1:1 sitter, psych consult    PT Comments    Patient found in recliner prior to session with sitter present. C/o of 7/10 pain in R knee, no increase in pain throughout session. Required min guard for transfers for safety. Pt was able to recall proper hand placement w/o cueing. Min guard needed for ambulation for safety. Increased time needed to ambulate 80 ft. Cues needed to stay on task. Patient plans on discharging to inpatient psych.  Recommendations for follow up therapy are one component of a multi-disciplinary discharge planning process, led by the attending physician.  Recommendations may be updated based on patient status, additional functional criteria and insurance authorization.  Follow Up Recommendations  Skilled nursing-short term rehab (<3 hours/day) Can patient physically be transported by private vehicle: Yes   Assistance Recommended at Discharge Frequent or  constant Supervision/Assistance  Patient can return home with the following A little help with walking and/or transfers;A little help with bathing/dressing/bathroom;Assistance with cooking/housework;Assist for transportation;Help with stairs or ramp for entrance;Direct supervision/assist for medications management   Equipment Recommendations  None recommended by PT    Recommendations for Other Services       Precautions / Restrictions Precautions Precautions: Fall Precaution Comments: ostomy Restrictions Weight Bearing Restrictions: No     Mobility  Bed Mobility               General bed mobility comments: Found in recliner prior to session.    Transfers Overall transfer level: Needs assistance Equipment used: Rolling walker (2 wheels) Transfers: Sit to/from Stand Sit to Stand: Min guard           General transfer comment: Min guard needed for transfers for safety. Pt was able to recall proper hand placement w/o cueing.    Ambulation/Gait Ambulation/Gait assistance: Min guard Gait Distance (Feet): 80 Feet Assistive device: Rolling walker (2 wheels) Gait Pattern/deviations: Step-through pattern, Decreased stride length, Trunk flexed, Narrow base of support Gait velocity: decreased     General Gait Details: Ambulated with min guard for safety. Increased time needed.   Stairs             Wheelchair Mobility    Modified Rankin (Stroke Patients Only)       Balance                                            Cognition Arousal/Alertness: Awake/alert Behavior During Therapy: WFL for tasks  assessed/performed Overall Cognitive Status: Within Functional Limits for tasks assessed Area of Impairment: Following commands, Safety/judgement                 Orientation Level: Disoriented to, Time Current Attention Level: Focused   Following Commands: Follows one step commands with increased time, Follows multi-step commands  inconsistently Safety/Judgement: Decreased awareness of safety, Decreased awareness of deficits Awareness: Intellectual   General Comments: A&O x3 this date, remains easily distracted and requires redirection to task, requies incr time to complete thoughts/express needs.        Exercises      General Comments        Pertinent Vitals/Pain Pain Assessment Pain Assessment: No/denies pain Pain Score: 7  Pain Location: R knee Pain Descriptors / Indicators: Aching Pain Intervention(s): Monitored during session, Limited activity within patient's tolerance    Home Living                          Prior Function            PT Goals (current goals can now be found in the care plan section) Acute Rehab PT Goals Patient Stated Goal: to get better PT Goal Formulation: With patient/family Time For Goal Achievement: 06/15/22 Potential to Achieve Goals: Good Progress towards PT goals: Progressing toward goals    Frequency    Min 2X/week      PT Plan Current plan remains appropriate    Co-evaluation              AM-PAC PT "6 Clicks" Mobility   Outcome Measure  Help needed turning from your back to your side while in a flat bed without using bedrails?: A Little Help needed moving from lying on your back to sitting on the side of a flat bed without using bedrails?: A Little Help needed moving to and from a bed to a chair (including a wheelchair)?: A Little Help needed standing up from a chair using your arms (e.g., wheelchair or bedside chair)?: A Little Help needed to walk in hospital room?: A Little Help needed climbing 3-5 steps with a railing? : A Lot 6 Click Score: 17    End of Session Equipment Utilized During Treatment: Gait belt Activity Tolerance: Patient tolerated treatment well Patient left: in chair;with call bell/phone within reach;with nursing/sitter in room Nurse Communication: Mobility status PT Visit Diagnosis: Difficulty in walking, not  elsewhere classified (R26.2)     Time: 0762-2633 PT Time Calculation (min) (ACUTE ONLY): 24 min  Charges:  $Gait Training: 8-22 mins $Therapeutic Activity: 8-22 mins                        Allyson Sabal 06/09/2022, 3:46 PM

## 2022-06-09 NOTE — Plan of Care (Signed)
Problem: Clinical Measurements: Goal: Ability to maintain clinical measurements within normal limits will improve Outcome: Progressing   Problem: Activity: Goal: Risk for activity intolerance will decrease Outcome: Progressing   Problem: Nutrition: Goal: Adequate nutrition will be maintained Outcome: [place], RN 06/09/22 8:41 AM

## 2022-06-09 NOTE — Progress Notes (Addendum)
Progress Note   Patient: [name] WPY:[id number] DOB: Jan 30, 1951 DOA: 05/13/2022     27 DOS: the patient was seen and examined on 06/09/2022   Brief hospital course: Mr. Mccord was admitted to the hospital with the working diagnosis of intentional overdose, suicidal attempt. Now pending transfer to inpatient psychiatry.   [id number] with hx Crohn's with persistent abd pain s/p extensive surgeries and is currently TPN dependent. Pt presented with unresponsiveness in apparent suicide attempt via overdose. Pt remained unresponsive and later was unable to maintain airway, prompting intubation and transfer to ICU. Pt now extubated and care transferred to hospitalist.  Psychiatry has recommended patient to continue with suicidal precautions and transfer to inpatient psychiatry unit.   Intentional drug overdose. Suicide ideation. MDD. Patient presented with an unresponsive event at home. Noted to have intentionally taken pain medication and anxiolytics with intention for suicide. Psychiatry was consulted. Recommended inpatient psychiatric admission. Patient currently medically stable although unable to find a place that can accommodate TPN. Referred to [place].  Accepted.  Awaiting a bed Patient complains of intermittent anxiety during the day, will add hydroxyzine as needed and continue close monitoring.    Acute toxic and metabolic encephalopathy. Likely combination of medication overdose as well as hypoxia from pneumonia. Head CT and MRI brain negative for any acute abnormality. Clinically resolved.   Aspiration pneumonia. Dysphagia. Completed IV antibiotic therapy. CT on 11/20 shows evidence of new evidence of right lower lobe infiltrate. Possibility of an atelectasis cannot be ruled out but more most likely this is pneumonitis. Procalcitonin level is negative.  Continue to monitor. Continue with Incentive spirometer Pneumonia has resolved.    Dysphagia. Speech therapy following the  patient. MBS was performed and patient was advanced to dysphagia 3 diet followed by advancing to regular diet. CT chest on 11/21 shows evidence of right lower lung infiltrate. Speech therapy consulted again.   Continue with D3 diet.   Continue with aspiration precautions, continue at high risk for aspiration   Crohn's disease. Patient had multiple surgeries in the past.currently not on any medical therapy. 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 Management per pharmacy.   Severe protein calorie malnutrition. Body mass index is 21.54 kg/m.  Nutrition Problem: Severe Malnutrition Etiology: chronic illness Interventions: TPN (increase nutrients)  Chronic prostatitis. Bladder calculi. BPH. Seen by [name] on 10/15. Patient is on Bactrim for 28 days since then although missed some doses while he was intubated. Currently based on my discussion with urology on 11/15 we will continue total 14-day treatment course for Bactrim. Patient had a Foley catheter placed in the ICU most likely due to critical condition. Patient is on Flomax also. Foley removed on 11/16.  Patient able to void after the removal. Discontinue oxybutynin as that can cause retention and increased PVR. Patient will follow-up with [name] for outpatient procedure for BPH.   Ileostomy leakage with irritation and dermatitis.. Wound care consulted. Currently being treated with stoma powder and frequent emptying. Stoma appears healthy and pink.  No surrounding skin cellulitis seen.  Patient is following up [name].  Patient had a PICC line placed in September 24 with the plan to continue TPN until the patient gains 30 to 40 pounds and then consider ileostomy closure. Patient was 130 pound at the placement of the PICC line. I discussed with [name] on 11/21.  I have informed her with regards to current ileostomy which appears to be healthy, as well as inform  her with  regards to weight gain.  Her primary concern is patient's need for weight gain as well as rehabilitation before considering any surgical intervention.  She currently agrees with the plan to go to [facility] or any inpatient psychiatric where the patient continues to have rehabilitation as well. Her main concern is patient should go to rehab to regain strength.  COVID exposure. Patient's wife and son both got tested positive on 11/15 for COVID-19. Family has been visiting the patient throughout the hospital stay. COVID-19 negative.  Currently no indication for isolation or repeating another COVID test.   Chronic pelvic pain. Patient continues to report worsening of his pelvic pain. X-ray hip and pelvis is unremarkable for any acute etiology. Patient had a CT abdomen and pelvis as well as CT chest angio abdomen and pelvis earlier last month during this hospital stay which were unremarkable. Patient is requiring IV Dilaudid. Patient reported to the same to psychiatry that the pain is actually causing him anxiety. Repeat CT abdomen pelvis on 06/05/2022 is negative for any acute abnormality to explain patient's pelvic pain.  Does have some chronic inflammation which is unchanged. Patient was informed that he does not require any IV narcotics as there is no acute fracture or ruptured viscus or any other acute abnormality. Patient told me that discussion with regards to transitioning from narcotics to other kind of medicine was associated with his suicidal attempt.   Transient A-fib. Resolved. Monitor.  Assessment and Plan: No notes have been filed under this hospital service. Service: Hospitalist       Subjective: Patient with intermittent anxiety during the day, no dyspnea or chest pain. Tolerating dysphagia diet.   Physical Exam: Vitals:   06/08/22 2056 06/09/22 0522 06/09/22 1006 06/09/22 1326  BP: 118/63 128/67  (!) 103/58  Pulse: 62 66  63  Resp: 16 16  18   Temp: 97.8 F (36.6 C) 98.2  F (36.8 C)  97.8 F (36.6 C)  TempSrc: Oral Oral  Oral  SpO2: 97% 97%  96%  Weight:   67.7 kg   Height:       Neurology awake and alert ENT with mild pallor Cardiovascular with S1 and S2 present and rhythmic Respiratory with no rales or wheezing Abdomen with no distention  No lower extremity edema  Data Reviewed:    Family Communication: no family at the bedside   Disposition: Status is: Inpatient Remains inpatient appropriate because: pending transfer to inpatient psychiatry   Planned Discharge Destination:  inpatient psychiatry      Author: Tawni Millers, MD 06/09/2022 3:01 PM  For on call review [email or web address].

## 2022-06-09 NOTE — Progress Notes (Signed)
Nutrition Follow-up  DOCUMENTATION CODES:   Severe malnutrition in context of chronic illness  INTERVENTION:  - Plan to increase cyclic TPN by ~100 calories to further aid in weight gain: Cyclic TPN with1 hour taper up/down for total x14 hour run time:  131.25g protein (1.9g/kg), 305g dextrose (GIR 2.87-5.74mg/kg/min), 55g lipids (25% calories) and 2112kcal (31kcal/kg). -Continue Regular per SLP recommendations. Low fiber food choices recommended. - Ensure Enlive po once daily, provides 350 kcal and 20 grams of protein.   NUTRITION DIAGNOSIS:   Severe Malnutrition related to chronic illness as evidenced by percent weight loss, severe fat depletion, severe muscle depletion. *ongoing  GOAL:   Patient will meet greater than or equal to 90% of their needs *being met with TPN  MONITOR:   Diet advancement, Labs, Other (Comment) (TPN)  REASON FOR ASSESSMENT:   Consult New TPN/TNA  ASSESSMENT:   Pt is a 71yo M with PMH of Crohn's disease resulting in a complicated surgical history followed at [facility] to include left hemicolectomy and ileostomy with multiple other complications to include TPN dependence, chronic prostatitis, SVT, and asthma who presents with generalized weakness, acute worsening of his chronic abdominal pain and AMS.   Met with patient and wife at bedside today. Patient noted his appetite has been "mediocre", especially after diet was downgraded from regular to mechanical soft 11/22. Patient notes he always has 3 meals a day but that he doesn't usually eat more than half. Meal intakes as below:   06/06/2022 0830 06/06/2022 1230 06/06/2022 1859 06/07/2022 1130 06/07/2022 1431 06/07/2022 1734 06/08/2022 0943 06/08/2022 1500   Percent Meals Eaten (%): 75 % 50 % 20 % 10 % 25 % 25 % 50 % 25 %    Wife notes patient has had issues with placement due to being on TPN. Plan for [facility] to [facility] once able. Patient and wife report they were  told patient needs to weigh between 160-170# to be considered for surgery at other facility. Therefore patient has strong desire and goal for weight gain. New weight taken via bed during visit by sitter and patient noted to be a similar weight to admission. Weight trends below: 10/28 148# 10/30 146# 11/10 150# 11/21 156# 11/24 149#  Patient remains on cyclic TPN. Informed patient and wife that TPN is providing all of calorie and protein needs and that any taken in by mouth is extra nutrition. Patient states he likes strawberry nutrition supplements and would like to receive once daily at 3pm to aid in weight gain.  Discussed also increasing TPN by 100 calories to further aid in weight gain and patient and wife in agreement. Discussed change with pharmacist. TPN for today made but will plan for increase tomorrow. Labs reviewed. Blood glucose well controlled. Triglycerides less than 150mg/dL.   Medications reviewed and include: Vitamin B12, Insulin  Labs reviewed:  Blood Glucose 93-106 x24 hours   Diet Order:   Diet Order             DIET DYS 3 Room service appropriate? Yes; Fluid consistency: Nectar Thick  Diet effective now                   EDUCATION NEEDS:   Education needs have been addressed  Skin:  Skin Assessment: Reviewed RN Assessment Skin Integrity Issues:: Other (Comment) Other: incontinence associated dermatitis to mid sacrum  Last BM:  11/24 - ileostomy  Height:  Ht Readings from Last 1 Encounters:  05/26/22 5' 10" (1.778   m)   Weight:  Wt Readings from Last 1 Encounters:  06/06/22 70.6 kg    BMI:  Body mass index is 22.33 kg/m.  Estimated Nutritional Needs:  Kcal:  2015-2350kcal Protein:  100-135g Fluid:  2400mL (Holliday Segar)      RD, LDN For contact information, refer to [facility].   

## 2022-06-10 DIAGNOSIS — G9341 Metabolic encephalopathy: Secondary | ICD-10-CM | POA: Diagnosis not present

## 2022-06-10 LAB — PHOSPHORUS: Phosphorus: 2.8 mg/dL (ref 2.5–4.6)

## 2022-06-10 LAB — COMPREHENSIVE METABOLIC PANEL
ALT: 41 U/L (ref 0–44)
AST: 42 U/L — ABNORMAL HIGH (ref 15–41)
Albumin: 3 g/dL — ABNORMAL LOW (ref 3.5–5.0)
Alkaline Phosphatase: 168 U/L — ABNORMAL HIGH (ref 38–126)
Anion gap: 5 (ref 5–15)
BUN: 27 mg/dL — ABNORMAL HIGH (ref 8–23)
CO2: 26 mmol/L (ref 22–32)
Calcium: 8.3 mg/dL — ABNORMAL LOW (ref 8.9–10.3)
Chloride: 108 mmol/L (ref 98–111)
Creatinine, Ser: 0.94 mg/dL (ref 0.61–1.24)
GFR, Estimated: >60 mL/min (ref >60)
Glucose, Bld: 102 mg/dL — ABNORMAL HIGH (ref 70–99)
Potassium: 3.9 mmol/L (ref 3.5–5.1)
Sodium: 139 mmol/L (ref 135–145)
Total Bilirubin: 0.4 mg/dL (ref 0.3–1.2)
Total Protein: 6.7 g/dL (ref 6.5–8.1)

## 2022-06-10 LAB — GLUCOSE, CAPILLARY
Glucose-Capillary: 127 mg/dL — ABNORMAL HIGH (ref 70–99)
Glucose-Capillary: 122 mg/dL — ABNORMAL HIGH (ref 70–99)

## 2022-06-10 LAB — MAGNESIUM: Magnesium: 1.9 mg/dL (ref 1.7–2.4)

## 2022-06-10 MED ORDER — TRAVASOL 10 % IV SOLN
INTRAVENOUS | Status: AC
  Filled 2022-06-10: qty 1380

## 2022-06-10 MED ORDER — BENZONATATE 100 MG PO CAPS
100.0000 mg | ORAL_CAPSULE | Freq: Three times a day (TID) | ORAL | Status: DC | PRN
  Administered 2022-06-10 – 2022-06-17 (×2): 100 mg via ORAL
  Filled 2022-06-10 (×2): qty 1

## 2022-06-10 NOTE — Progress Notes (Signed)
Pt. Refused his 2200 Lovenox this shift 06/09/2022.  Floor coverage made aware.

## 2022-06-10 NOTE — Progress Notes (Signed)
Progress Note   Patient: [name] DXI:[id number] DOB: 1951-06-03 DOA: 05/13/2022     28 DOS: the patient was seen and examined on 06/10/2022   Brief hospital course: [name] was admitted to the hospital with the working diagnosis of intentional overdose, suicidal attempt. Now pending transfer to inpatient psychiatry.     A/P:  Intentional drug overdose. Suicide ideation. MDD. Patient presented with an unresponsive event at home. Noted to have intentionally taken pain medication and anxiolytics with intention for suicide. Psychiatry was consulted. Recommended inpatient psychiatric admission. Patient currently medically stable although unable to find a place that can accommodate TPN. Referred to [place].  Accepted.  Awaiting a bed  Acute toxic and metabolic encephalopathy. Likely combination of medication overdose as well as hypoxia from pneumonia. Head CT and MRI brain negative for any acute abnormality. Clinically resolved.   Aspiration pneumonia. Completed IV antibiotic therapy. CT on 11/20 shows evidence of new evidence of right lower lobe infiltrate. Possibility of an atelectasis cannot be ruled out but more most likely this is pneumonitis. Procalcitonin level is negative.  Continue to monitor. Continue with Incentive spirometer Pneumonia has resolved.    Dysphagia. SLP following the patient. MBS was performed and patient was advanced to dysphagia 3 diet followed by advancing to regular diet. CT chest on 11/21 shows evidence of right lower lung infiltrate. Speech therapy consulted again.   Continue with D3 diet.   Continue with aspiration precautions, continue at high risk for aspiration   Crohn's disease. Patient had multiple surgeries in the past.currently not on any medical therapy. 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 Management per pharmacy.   Severe protein calorie malnutrition. Body mass index is 21.54 kg/m.  Nutrition  Problem: Severe Malnutrition Etiology: chronic illness Interventions: TPN (increase nutrients)  Chronic prostatitis. Bladder calculi. BPH. Seen by [name] on 10/15. Patient is on Bactrim for 28 days since then although missed some doses while he was intubated. per urology(11/15): total 14-day treatment course for Bactrim. Patient had a Foley catheter placed in the ICU most likely due to critical condition. Patient is on Flomax also. Foley removed on 11/16.  Patient able to void after the removal. Discontinue oxybutynin as that can cause retention and increased PVR. Patient will follow-up with [name] for outpatient procedure for BPH.   Ileostomy leakage with irritation and dermatitis.. Wound care consulted. Currently being treated with stoma powder and frequent emptying. Stoma appears healthy and pink.  No surrounding skin cellulitis seen.  Patient is following up with [name].  Patient had a PICC line placed in September 24 with the plan to continue TPN until the patient gains 30 to 40 pounds and then consider ileostomy closure. Patient was 130 pound at the placement of the PICC line. -[name] on 11/21:  informed her with regards to current ileostomy which appears to be healthy, as well as inform her with regards to weight gain.  Her primary concern is patient's need for weight gain as well as rehabilitation before considering any surgical intervention.  She currently agrees with the plan to go to [facility] or any inpatient psychiatric where the patient continues to have rehabilitation as well. Her main concern is patient should go to rehab to regain strength.  COVID exposure. Patient's wife and son both got tested positive on 11/15 for COVID-19. Family has been visiting the patient throughout the hospital stay. COVID-19 negative.  Currently no indication for isolation or repeating another COVID test.  Chronic pelvic pain. Patient continues to report worsening  of his pelvic pain. X-ray hip and pelvis is unremarkable for any acute etiology. Patient had a CT abdomen and pelvis as well as CT chest angio abdomen and pelvis earlier last month during this hospital stay which were unremarkable. Patient is requiring IV Dilaudid. Patient reported to the same to psychiatry that the pain is actually causing him anxiety. Repeat CT abdomen pelvis on 06/05/2022 is negative for any acute abnormality to explain patient's pelvic pain.  Does have some chronic inflammation which is unchanged.    Transient A-fib. Resolved. Monitor.      Subjective: + cough  Physical Exam: Vitals:   06/09/22 1006 06/09/22 1326 06/09/22 2100 06/10/22 0500  BP:  (!) 103/58 120/70 135/72  Pulse:  63 64 68  Resp:  18 15 16   Temp:  97.8 F (36.6 C) 97.8 F (36.6 C) 98.8 F (37.1 C)  TempSrc:  Oral Axillary Axillary  SpO2:  96% 98% 97%  Weight: 67.7 kg     Height:        General: Appearance:    Elderly male in no acute distress     Lungs:     respirations unlabored  Heart:    Normal heart rate. Normal rhythm. No murmurs, rubs, or gallops.   MS:   All extremities are intact.   Neurologic:   Awake, alert       Family Communication: no family at the bedside   Disposition: Status is: Inpatient Remains inpatient appropriate because: pending transfer to inpatient psychiatry   Planned Discharge Destination:  inpatient psychiatry      Author: Geradine Girt, DO 06/10/2022 9:40 AM  For on call review [email or web address].

## 2022-06-10 NOTE — Progress Notes (Signed)
PHARMACY - TOTAL PARENTERAL NUTRITION CONSULT NOTE   Indication: Crohn's disease and complex past surgical history including ex lap with small bowel resection, lysis of adhesion, mesh explantation, and diverting loop ileostomy in April 2023, on TPN prior to admission  Patient Measurements: Height: _0  (177.8 cm) Weight: 67.7 kg (149 lb 4 oz) IBW/kg (Calculated) : 73 TPN AdjBW (KG): 68.1 Body mass index is 21.42 kg/m.  Assessment: Patient with PMH of Crohn's disease and complex surgical history as above on chronic TPN who presented on 05/13/22 with altered mental status after spouse found patient unresponsive with multiple pill bottles open around him. Patient was transported from SNF to ED the day prior for generalized weakness and acute worsening of his chronic abdominal pain. Patient decided to return home instead of his SNF. Pharmacy consulted to resume TPN as inpatient.   Discharge from [facility] 04/19/22:  TPN pharmacist at [facility]:  Estimated Nutrient Requirements: Protein: 1.5 - 1.7 g/kg/day (98 - 104 g/day), Total kcal: 30 - 35 kcal/kg/day (1830 - 1983 kcal/day)  TPN Formulation at [facility] per TPN pharmacist: [A 6.6%, D 20%, F 3.1%] @ goal rate, 65 mL/hr (TV: 1560 mL/day) provides: Protein 1.7 g/kg/day 103 g/day, Total kcal 32 kcal/kg/day 1971 kcal/day  NPC: N ratio 94   Admitted at [facility] 04/29/22-05/03/22: Nutritional Goals: TPN 80 mL/hr (provides 113 g of protein and 1943 kcals per day) RD Assessment: (10/16): 2100-2450kcal (30-35kcal/kg), Protein 105-130g (1.5-1.8g/kg) Utilize intralipid rather than SMOF as 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  [facility]: [name], PharmD, ph: [phone] 10/28 Most recent formula: Total volume 1560 mL:  Dextrose 220 g/day, Plenamine 120 g/day, SMOFlipid 50 g/day, NaCl 75 mEq, CaGluc 6 mEq, Potassium Ac 20 mEq, Potassium Phos 13.5 nM, Mag Sulfate 8 mEq, Vitamins/trace  elements   GI issues: Crohn's dz with chronic TPN, aspiration, dysphagia (followed by speech), severe PCM, ileostomy leakage  - PICC line placed in September 24 with the plan to continue TPN until the patient gains 30 to 40 pounds and then consider ileostomy closure.  Glucose / Insulin: No hx DM.  CBGs at goal <150. (CBGs BID) Electrolytes: Lytes wnl including CorrCa  Renal: SCr ok; BUN slightly elevated at 27; UOP good Hepatic: AST slight elevated today, Alk phos remains elevated  - Albumin low 3 - TG 109 (11/23) I/O: I/O -318 - ileostomy output 1250 mL up, UOP 0.74m/kg/hr (12533m - LBM 11/24 - po 400 GI Imaging: - 10/14 CTa/p: previous surgical changes noted (transverse-sigmoid anastomosis) with stable inflammation since 10/12 CT - 10/27 CTa/p: no significant interval change since 10/14 CT. No evidence of bowel obstruction or other complicating feature at the anastomotic site or ostomy. - 11/6 abd [place] normal liver (checked d/t elevated LFTs) - 11/20 CT: Same postoperative changes. Inflammatory change in the right lower quadrant is not significantly changed since 05/12/2022. New patchy opacity in the right lung base. GI Surgeries / Procedures: n/a  Central access: prior to admission - double lumen CVC (03/2022, 130lbs) TPN start date: started PTA  Nutritional Goals: TPN provides 131 g of protein and 2010 kcals per day.  RD Assessment this admission:    Estimated Needs Total Energy Estimated Needs: 2015-2350kcal Total Protein Estimated Needs: 100-135g Total Fluid Estimated Needs: 240071mHolliday Segar)  Current Nutrition:  TPN Dysphagia-3 11/9 >> Regular 11/17 >>  Dysphagia 3 11/21 >>   Plan:  Increase Cyclic TPN to [id number] over 14  hrs Will provide 138g AA, 25.91% lipids, 2222 kcal GIR 3-6.1 Electrolytes in TPN:  Na 75 mEq/L K 40 mEq/L Ca 5 mEq/L Mg 5 mEq/L Phos 15 mmol/L Cl:Ac = 1:1 Add standard MVI and trace elements to TPN  With excellent CBG control, will limit  finger sticks to twice daily.  Timed 2 hrs after TPN start, 1 hour after TPN stop  IVF dc'd by MD Monitor TPN labs on Mon/Thurs at minimum and PRN  Calianna Kim S. Alford Highland, PharmD, BCPS Clinical Staff Pharmacist [email or web address] 06/10/2022 9:50 AM

## 2022-06-11 DIAGNOSIS — G9341 Metabolic encephalopathy: Secondary | ICD-10-CM | POA: Diagnosis not present

## 2022-06-11 LAB — COMPREHENSIVE METABOLIC PANEL
ALT: 47 U/L — ABNORMAL HIGH (ref 0–44)
AST: 47 U/L — ABNORMAL HIGH (ref 15–41)
Albumin: 2.8 g/dL — ABNORMAL LOW (ref 3.5–5.0)
Alkaline Phosphatase: 179 U/L — ABNORMAL HIGH (ref 38–126)
Anion gap: 4 — ABNORMAL LOW (ref 5–15)
BUN: 30 mg/dL — ABNORMAL HIGH (ref 8–23)
CO2: 25 mmol/L (ref 22–32)
Calcium: 8.3 mg/dL — ABNORMAL LOW (ref 8.9–10.3)
Chloride: 111 mmol/L (ref 98–111)
Creatinine, Ser: 0.86 mg/dL (ref 0.61–1.24)
GFR, Estimated: >60 mL/min (ref >60)
Glucose, Bld: 107 mg/dL — ABNORMAL HIGH (ref 70–99)
Potassium: 3.8 mmol/L (ref 3.5–5.1)
Sodium: 140 mmol/L (ref 135–145)
Total Bilirubin: 0.5 mg/dL (ref 0.3–1.2)
Total Protein: 6.5 g/dL (ref 6.5–8.1)

## 2022-06-11 LAB — GLUCOSE, CAPILLARY
Glucose-Capillary: 90 mg/dL (ref 70–99)
Glucose-Capillary: 112 mg/dL — ABNORMAL HIGH (ref 70–99)

## 2022-06-11 MED ORDER — TRAVASOL 10 % IV SOLN
INTRAVENOUS | Status: AC
  Filled 2022-06-11: qty 1380

## 2022-06-11 NOTE — Progress Notes (Signed)
Progress Note   Patient: [name]:[id number] DOB: [birth year] DOA: 05/13/2022     29 DOS: the patient was seen and examined on 06/11/2022   Brief hospital course: [name] was admitted to the hospital with the working diagnosis of intentional overdose, suicidal attempt. Now pending transfer to inpatient psychiatry- difficult as he is on TPN.     A/P: Intentional drug overdose. Suicide ideation. MDD. -presented with an unresponsive event at home. Noted to have intentionally taken pain medication and anxiolytics with intention for suicide. Psychiatry was consulted: Recommended inpatient psychiatric admission. Patient currently medically stable although unable to find a place that can accommodate TPN. Referred to [facility].  Accepted.  Awaiting a bed  Acute toxic and metabolic encephalopathy. Likely combination of medication overdose as well as hypoxia from pneumonia. Head CT and MRI brain negative for any acute abnormality. Clinically resolved.   Aspiration pneumonia. Completed IV antibiotic therapy. CT on 11/20 shows evidence of new evidence of right lower lobe infiltrate. Possibility of an atelectasis cannot be ruled out but more most likely this is pneumonitis. Procalcitonin level is negative.  Continue to monitor. Continue with Incentive spirometer Pneumonia has resolved.    Dysphagia. SLP following the patient. MBS was performed and patient was advanced to dysphagia 3 diet followed by advancing to regular diet. CT chest on 11/21 shows evidence of right lower lung infiltrate. Speech therapy consulted again.   Continue with D3 diet.   Continue with aspiration precautions, continue at high risk for aspiration   Crohn's disease. Patient had multiple surgeries in the past.currently not on any medical therapy. 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 Management per pharmacy.   Severe protein calorie malnutrition. Body mass index is 21.54  kg/m.  Nutrition Problem: Severe Malnutrition Etiology: chronic illness Interventions: TPN (increase nutrients)  Chronic prostatitis. Bladder calculi. BPH. Seen by [name] on 10/15. Patient is on Bactrim for 28 days since then although missed some doses while he was intubated. per urology(11/15): total 14-day treatment course for Bactrim. Patient had a Foley catheter placed in the ICU most likely due to critical condition. Patient is on Flomax also. Foley removed on 11/16.  Patient able to void after the removal. Discontinue oxybutynin as that can cause retention and increased PVR. Patient will follow-up with [name] for outpatient procedure for BPH.   Ileostomy leakage with irritation and dermatitis.. Wound care consulted. Currently being treated with stoma powder and frequent emptying. Stoma appears healthy and pink.  No surrounding skin cellulitis seen.  Patient is [name].  Patient had a PICC line placed in September 24 with the plan to continue TPN until the patient gains 30 to 40 pounds and then consider ileostomy closure. Patient was 130 pound at the placement of the PICC line. -[name] on 11/21:  informed her with regards to current ileostomy which appears to be healthy, as well as inform her with regards to weight gain.  Her primary concern is patient's need for weight gain as well as rehabilitation before considering any surgical intervention.  She currently agrees with the plan to go to [facility] or any inpatient psychiatric where the patient continues to have rehabilitation as well. Her main concern is patient should go to rehab to regain strength.  COVID exposure. Patient's wife and son both got tested positive on 11/15 for COVID-19. Family has been visiting the patient throughout the hospital stay. COVID-19 negative.  Currently no indication for isolation or repeating  another COVID test.   Chronic pelvic pain. Patient continues to  report worsening of his pelvic pain. X-ray hip and pelvis is unremarkable for any acute etiology. Patient had a CT abdomen and pelvis as well as CT chest angio abdomen and pelvis earlier last month during this hospital stay which were unremarkable. Patient is requiring IV Dilaudid. Patient reported to the same to psychiatry that the pain is actually causing him anxiety. Repeat CT abdomen pelvis on 06/05/2022 is negative for any acute abnormality to explain patient's pelvic pain.  Does have some chronic inflammation which is unchanged.    Transient A-fib. Resolved. Monitor.      Subjective: no complaints  Physical Exam: Vitals:   06/10/22 0500 06/10/22 1436 06/10/22 2243 06/11/22 0458  BP: 135/72 118/66 122/66 118/64  Pulse: 68 71 69 75  Resp: 16 18 18 18   Temp: 98.8 F (37.1 C) 98.3 F (36.8 C) (!) 97.5 F (36.4 C) 98.5 F (36.9 C)  TempSrc: Axillary     SpO2: 97% 98% 97% 96%  Weight:      Height:        General: Appearance:    Well developed, well nourished male in no acute distress     Lungs:     respirations unlabored  Heart:    Normal heart rate.   MS:   All extremities are intact.   Neurologic:   Awake, alert         Family Communication: no family at the bedside   Disposition: Status is: Inpatient Remains inpatient appropriate because: pending transfer to inpatient psychiatry   Planned Discharge Destination:  inpatient psychiatry    Author: Geradine Girt, DO 06/11/2022 10:50 AM  For on call review [email or web address].

## 2022-06-11 NOTE — Plan of Care (Signed)
  Problem: Coping: Goal: Level of anxiety will decrease Outcome: Progressing   Problem: Pain Managment: Goal: General experience of comfort will improve Outcome: Progressing   

## 2022-06-11 NOTE — Progress Notes (Signed)
PHARMACY - TOTAL PARENTERAL NUTRITION CONSULT NOTE   Indication: Crohn's disease and complex past surgical history including ex lap with small bowel resection, lysis of adhesion, mesh explantation, and diverting loop ileostomy in April 2023, on TPN prior to admission  Patient Measurements: Height: _0  (177.8 cm) Weight: 67.7 kg (149 lb 4 oz) IBW/kg (Calculated) : 73 TPN AdjBW (KG): 68.1 Body mass index is 21.42 kg/m.  Assessment: Patient with PMH of Crohn's disease and complex surgical history as above on chronic TPN who presented on 05/13/22 with altered mental status after spouse found patient unresponsive with multiple pill bottles open around him. Patient was transported from SNF to ED the day prior for generalized weakness and acute worsening of his chronic abdominal pain. Patient decided to return home instead of his SNF. Pharmacy consulted to resume TPN as inpatient.   Discharge from [facility] 04/19/22:  TPN pharmacist at [facility]:  Estimated Nutrient Requirements: Protein: 1.5 - 1.7 g/kg/day (98 - 104 g/day), Total kcal: 30 - 35 kcal/kg/day (1830 - 1983 kcal/day)  TPN Formulation at [facility] per TPN pharmacist: [A 6.6%, D 20%, F 3.1%] @ goal rate, 65 mL/hr (TV: 1560 mL/day) provides: Protein 1.7 g/kg/day 103 g/day, Total kcal 32 kcal/kg/day 1971 kcal/day  NPC: N ratio 94   Admitted at [facility] 04/29/22-05/03/22: Nutritional Goals: TPN 80 mL/hr (provides 113 g of protein and 1943 kcals per day) RD Assessment: (10/16): 2100-2450kcal (30-35kcal/kg), Protein 105-130g (1.5-1.8g/kg) Utilize intralipid rather than SMOF as 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  [facility]: [name], PharmD, ph: [phone] 10/28 Most recent formula: Total volume 1560 mL:  Dextrose 220 g/day, Plenamine 120 g/day, SMOFlipid 50 g/day, NaCl 75 mEq, CaGluc 6 mEq, Potassium Ac 20 mEq, Potassium Phos 13.5 nM, Mag Sulfate 8 mEq, Vitamins/trace  elements   GI issues: Crohn's dz with chronic TPN, aspiration, dysphagia (followed by speech), severe PCM, ileostomy leakage  - PICC line placed in September 24 with the plan to continue TPN until the patient gains 30 to 40 pounds and then consider ileostomy closure.  Glucose / Insulin: No hx DM.  CBGs at goal <150. (CBGs BID), 1 unit SSI Electrolytes: Lytes wnl including CorrCa  Renal: SCr ok; BUN slightly elevated at 30; UOP ok Hepatic: AST slightly more elevated today, ALT now elevated, Alk phos remains elevated , Tbili WNL - Albumin low 2.8 - TG 109 (11/23) I/O:  - ileostomy output 1250 mL up, UOP 0.56m/kg/hr (9052m - LBM 11/26 - po 540, Dys 3 diet GI Imaging: - 10/14 CTa/p: previous surgical changes noted (transverse-sigmoid anastomosis) with stable inflammation since 10/12 CT - 10/27 CTa/p: no significant interval change since 10/14 CT. No evidence of bowel obstruction or other complicating feature at the anastomotic site or ostomy. - 11/6 abd [place] normal liver (checked d/t elevated LFTs) - 11/20 CT: Same postoperative changes. Inflammatory change in the right lower quadrant is not significantly changed since 05/12/2022. New patchy opacity in the right lung base. GI Surgeries / Procedures: n/a  Central access: prior to admission - double lumen CVC (03/2022, 130lbs) TPN start date: started PTA  Nutritional Goals: TPN provides 131 g of protein and 2010 kcals per day.  RD Assessment this admission:    Estimated Needs Total Energy Estimated Needs: 2015-2350kcal Total Protein Estimated Needs: 100-135g Total Fluid Estimated Needs: 240029mHolliday Segar)  Current Nutrition:  TPN Dysphagia-3 11/9 >> Regular 11/17 >>  Dysphagia 3 11/21 >>  Plan:  Increase Cyclic TPN to 5830 ml over 14 hrs Will provide (incr) 138g AA, 25.91% lipids, 2222 kcal GIR 3-6.1 Electrolytes in TPN:  Na 75 mEq/L K 40 mEq/L Ca 5 mEq/L Mg 5 mEq/L Phos 15 mmol/L Cl:Ac = 1:1 Add standard MVI and  trace elements to TPN  With excellent CBG control, will limit finger sticks to twice daily.  Timed 2 hrs after TPN start, 1 hour after TPN stop  IVF dc'd by MD Monitor TPN labs on Mon/Thurs at minimum and PRN  Dream Harman S. Alford Highland, PharmD, BCPS Clinical Staff Pharmacist [email or web address] 06/11/2022 8:53 AM

## 2022-06-12 DIAGNOSIS — G9341 Metabolic encephalopathy: Secondary | ICD-10-CM | POA: Diagnosis not present

## 2022-06-12 DIAGNOSIS — R401 Stupor: Secondary | ICD-10-CM | POA: Diagnosis not present

## 2022-06-12 LAB — COMPREHENSIVE METABOLIC PANEL
ALT: 45 U/L — ABNORMAL HIGH (ref 0–44)
AST: 45 U/L — ABNORMAL HIGH (ref 15–41)
Albumin: 2.7 g/dL — ABNORMAL LOW (ref 3.5–5.0)
Alkaline Phosphatase: 163 U/L — ABNORMAL HIGH (ref 38–126)
Anion gap: 8 (ref 5–15)
BUN: 30 mg/dL — ABNORMAL HIGH (ref 8–23)
CO2: 24 mmol/L (ref 22–32)
Calcium: 8.3 mg/dL — ABNORMAL LOW (ref 8.9–10.3)
Chloride: 109 mmol/L (ref 98–111)
Creatinine, Ser: 0.86 mg/dL (ref 0.61–1.24)
GFR, Estimated: >60 mL/min (ref >60)
Glucose, Bld: 119 mg/dL — ABNORMAL HIGH (ref 70–99)
Potassium: 3.8 mmol/L (ref 3.5–5.1)
Sodium: 141 mmol/L (ref 135–145)
Total Bilirubin: 0.6 mg/dL (ref 0.3–1.2)
Total Protein: 6.5 g/dL (ref 6.5–8.1)

## 2022-06-12 LAB — PHOSPHORUS: Phosphorus: 3.2 mg/dL (ref 2.5–4.6)

## 2022-06-12 LAB — MAGNESIUM: Magnesium: 2 mg/dL (ref 1.7–2.4)

## 2022-06-12 LAB — GLUCOSE, CAPILLARY
Glucose-Capillary: 94 mg/dL (ref 70–99)
Glucose-Capillary: 113 mg/dL — ABNORMAL HIGH (ref 70–99)

## 2022-06-12 LAB — TRIGLYCERIDES: Triglycerides: 100 mg/dL (ref <150)

## 2022-06-12 MED ORDER — TRAVASOL 10 % IV SOLN
INTRAVENOUS | Status: AC
  Filled 2022-06-12: qty 1380

## 2022-06-12 NOTE — Progress Notes (Signed)
PROGRESS NOTE    [name]  SNK:[id number] DOB: Jan 16, 1951 DOA: 05/13/2022 PCP: [name], MD   Brief Narrative:  71 year old with history of Crohn's disease with history of exploratory laparotomy with small bowel resection, creation of diverting loop/ileostomy at [facility] with persistent abdominal pain and currently TPN dependent admitted for being unresponsive likely from suicide attempt from overdose.  CT of the head was negative.  Initially patient was intubated and transferred to the ICU on 10/30.  ICU stay was also complicated by atrial fibrillation with RVR.  Eventually he was extubated on 11/1 and seen by psychiatry who recommended inpatient admission.  This has been difficult to achieve as patient is on TPN.   Assessment & Plan:  Principal Problem:   Metabolic encephalopathy Active Problems:   Chronic anemia   Acute respiratory failure with hypoxia (HCC)   Severe malnutrition (HCC)   Generalized weakness   Drug overdose, intentional, initial encounter ([place])   Chronic pain syndrome   Aspiration pneumonia ([place])     Intentional drug overdose. Suicide ideation. MDD. -Initially was in ICU intubated for short period time then extubated in stable condition.  Seen by psychiatry who has recommended inpatient admission TOC working on placement Current medications per psychiatry-BuSpar, Zoloft   Acute toxic and metabolic encephalopathy. Clinical resolved.  Initial CT head and MRI brain negative   Aspiration pneumonia.  Right lower lobe infiltrate Resolved and completed IV antibiotic treatment.   Dysphagia. Seen by speech therapy who is recommending dysphagia 3 diet   Crohn's disease. Previous extensive abdominal surgery at [facility].  Chronically on TPN   Severe protein calorie malnutrition. On TPN   Chronic prostatitis. Bladder calculi. BPH. Seen by [name] on 10/15. Patient is on Bactrim for 28 days since then although missed some  doses while he was intubated. per urology(11/15): total 14-day treatment course for Bactrim. Patient had a Foley catheter placed in the ICU most likely due to critical condition. Patient is on Flomax also. Foley removed on 11/16.  Patient able to void after the removal. Discontinue oxybutynin as that can cause retention and increased PVR. Patient will follow-up with [name] for outpatient procedure for BPH.   Ileostomy leakage with irritation and dermatitis.. Wound care consulted. Currently being treated with stoma powder and frequent emptying. Stoma appears healthy and pink.  No surrounding skin cellulitis seen.  Patient is following up [name].  Patient had a PICC line placed in September 24 with the plan to continue TPN until the patient gains 30 to 40 pounds and then consider ileostomy closure. Patient was 130 pound at the placement of the PICC line. -[name] on 11/21:  informed her with regards to current ileostomy which appears to be healthy, as well as inform her with regards to weight gain.  Her primary concern is patient's need for weight gain as well as rehabilitation before considering any surgical intervention.  She currently agrees with the plan to go to [facility] or any inpatient psychiatric where the patient continues to have rehabilitation as well. Her main concern is patient should go to rehab to regain strength.   COVID exposure. Patient's wife and son both got tested positive on 11/15 for COVID-19. Family has been visiting the patient throughout the hospital stay. COVID-19 negative.  Currently no indication for isolation or repeating another COVID test.   Chronic pelvic pain. Patient continues to report worsening of his pelvic pain. X-ray hip and pelvis is unremarkable  for any acute etiology. Patient had a CT abdomen and pelvis as well as CT chest angio abdomen and pelvis earlier last month during this hospital stay which were unremarkable. Patient  is requiring IV Dilaudid. Patient reported to the same to psychiatry that the pain is actually causing him anxiety. Repeat CT abdomen pelvis on 06/05/2022 is negative for any acute abnormality to explain patient's pelvic pain.  Does have some chronic inflammation which is unchanged.     Transient A-fib. Resolved. Monitor.      DVT prophylaxis: Lovenox Code Status: Full code Family Communication:    Status is: Inpatient Remains inpatient appropriate because: Currently awaiting inpatient bed   Nutritional status    Signs/Symptoms: percent weight loss, severe fat depletion, severe muscle depletion Percent weight loss: 8 % (in 3 months)  Interventions: TPN (increase nutrients)  Body mass index is 21.42 kg/m.         Subjective: Seen and examined at bedside, does not have any complaints.  Tolerating his TPN.   Examination:  General exam: Appears calm and comfortable  Respiratory system: Clear to auscultation. Respiratory effort normal. Cardiovascular system: S1 & S2 heard, RRR. No JVD, murmurs, rubs, gallops or clicks. No pedal edema. Gastrointestinal system: Abdomen is nondistended, soft and nontender. No organomegaly or masses felt. Normal bowel sounds heard. Central nervous system: Alert and oriented. No focal neurological deficits. Extremities: Symmetric 5 x 5 power. Skin: Surgical abdominal scars from his previous surgeries Psychiatry: Judgement and insight appear normal. Mood & affect appropriate.     Objective: Vitals:   06/11/22 0458 06/11/22 1404 06/11/22 2342 06/12/22 0549  BP: 118/64 128/63 124/63 (!) 117/53  Pulse: 75 62 69 77  Resp: 18 15 17 16   Temp: 98.5 F (36.9 C) 98 F (36.7 C) 98.4 F (36.9 C) 98.6 F (37 C)  TempSrc:  Oral Oral Oral  SpO2: 96% 100% 96% 93%  Weight:      Height:        Intake/Output Summary (Last 24 hours) at 06/12/2022 0826 Last data filed at 06/12/2022 0717 Gross per 24 hour  Intake 2868.2 ml  Output 2650 ml   Net 218.2 ml   Filed Weights   05/26/22 1215 06/06/22 1402 06/09/22 1006  Weight: 68.1 kg 70.6 kg 67.7 kg     Data Reviewed:   CBC: Recent Labs  Lab 06/06/22 1012 06/07/22 0423  WBC 2.5* 3.8*  NEUTROABS 0.9*  --   HGB 8.9* 8.8*  HCT 30.3* 29.5*  MCV 80.2 81.5  PLT 123* 093*   Basic Metabolic Panel: Recent Labs  Lab 06/06/22 1012 06/07/22 0423 06/08/22 0422 06/09/22 0440 06/10/22 0227 06/11/22 0303 06/12/22 0247  NA 136   < > 136 139 139 140 141  K 4.1   < > 4.0 4.3 3.9 3.8 3.8  CL 106   < > 106 109 108 111 109  CO2 25   < > 24 25 26 25 24   GLUCOSE 114*   < > 93 110* 102* 107* 119*  BUN 25*   < > 23 28* 27* 30* 30*  CREATININE 0.84   < > 0.94 0.87 0.94 0.86 0.86  CALCIUM 8.1*   < > 8.2* 8.4* 8.3* 8.3* 8.3*  MG 1.9  --  2.0  --  1.9  --  2.0  PHOS  --   --  3.1  --  2.8  --  3.2   < > = values in this interval not displayed.   GFR: Estimated  Creatinine Clearance: 77.6 mL/min (by C-G formula based on SCr of 0.86 mg/dL). Liver Function Tests: Recent Labs  Lab 06/08/22 0422 06/09/22 0440 06/10/22 0227 06/11/22 0303 06/12/22 0247  AST 40 37 42* 47* 45*  ALT 40 37 41 47* 45*  ALKPHOS 156* 155* 168* 179* 163*  BILITOT 0.3 0.4 0.4 0.5 0.6  PROT 6.4* 6.4* 6.7 6.5 6.5  ALBUMIN 2.7* 2.8* 3.0* 2.8* 2.7*   No results for input(s): "LIPASE", "AMYLASE" in the last 168 hours. No results for input(s): "AMMONIA" in the last 168 hours. Coagulation Profile: No results for input(s): "INR", "PROTIME" in the last 168 hours. Cardiac Enzymes: No results for input(s): "CKTOTAL", "CKMB", "CKMBINDEX", "TROPONINI" in the last 168 hours. BNP (last 3 results) No results for input(s): "PROBNP" in the last 8760 hours. HbA1C: No results for input(s): "HGBA1C" in the last 72 hours. CBG: Recent Labs  Lab 06/09/22 2052 06/10/22 0958 06/10/22 1941 06/11/22 0907 06/11/22 1942  GLUCAP 113* 127* 122* 90 112*   Lipid Profile: Recent Labs    06/12/22 0247  TRIG 100    Thyroid Function Tests: No results for input(s): "TSH", "T4TOTAL", "FREET4", "T3FREE", "THYROIDAB" in the last 72 hours. Anemia Panel: No results for input(s): "VITAMINB12", "FOLATE", "FERRITIN", "TIBC", "IRON", "RETICCTPCT" in the last 72 hours. Sepsis Labs: Recent Labs  Lab 06/06/22 1012 06/07/22 0423 06/08/22 0422  PROCALCITON <0.10 <0.10 <0.10    No results found for this or any previous visit (from the past 240 hour(s)).       Radiology Studies: No results found.      Scheduled Meds:  busPIRone  5 mg Oral BID   Chlorhexidine Gluconate Cloth  6 each Topical Daily   vitamin B-12  1,000 mcg Oral Daily   enoxaparin (LOVENOX) injection  40 mg Subcutaneous Q24H   feeding supplement  237 mL Oral Q24H   finasteride  5 mg Oral Daily   insulin aspart  0-9 Units Subcutaneous 2 times per day   lidocaine  1 patch Transdermal Q24H   methocarbamol  500 mg Oral TID   pantoprazole  40 mg Oral Daily   psyllium  1 packet Oral Daily   sertraline  100 mg Oral Daily   simethicone  80 mg Oral TID WC & HS   tamsulosin  0.4 mg Oral QPC supper   Continuous Infusions:  sodium chloride     TPN CYCLIC-ADULT (ION)       LOS: 30 days   Time spent= 35 mins    Stpehen Petitjean Arsenio Loader, MD [facility]  If 7PM-7AM, please contact night-coverage  06/12/2022, 8:26 AM

## 2022-06-12 NOTE — Progress Notes (Addendum)
Physical Therapy Treatment Patient Details Name: [name] MRN: [id number] DOB: [birth year] Today's Date: 06/12/2022   History of Present Illness 71 year old male with complicated past medical history as outlined below, which is significant for Crohn's disease with persistent abdominal pain.  On April of this year he underwent exploratory laparotomy with small bowel resection, repair of enterotomy, and creation of a diverting loop ileostomy at [facility].  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  He has been residing in [facility] from where he presented to the emergency department on 10/27 for pain at the site of his ostomy with foul-smelling discharge.  Work-up of the ostomy and abdomen including CT imaging was unremarkable.  He was discharged to SNF, however, rather than returning to SNF he found private transport to his home.Then on 10/28 he again presented to [facility] long emergency department with chief complaint of unresponsiveness.11/2 reveals that ingestion of pills at home was in fact a suicide attempt. 1:1 sitter, psych consult    PT Comments     Session focused on removing dependent devices/barriers to independence.  Primofit removed after discussion with pt and RN, pt agreeable and reports he is continent normally, encouraged him to walk to bathroom as able with staff; removed walker from pt use (he may use to amb with nursing staff if needed) pt completed UB/LB dressing with min assist in preparation for going outside; pt initially resistant and then agreeable to going outside, pleased once out; pt son present, supportive and encouraging and was able to document outing with pics for pt and family.  pt called wife and FT family. Pt in better spirits afterward however does report significant fatigue from second PT session.  Continue PT POC   Recommendations for follow up therapy are one component of a multi-disciplinary discharge planning process, led by the attending  physician.  Recommendations may be updated based on patient status, additional functional criteria and insurance authorization.  Follow Up Recommendations  Home health PT     Assistance Recommended at Discharge Intermittent Supervision/Assistance  Patient can return home with the following A little help with walking and/or transfers;A little help with bathing/dressing/bathroom;Assistance with cooking/housework;Assist for transportation;Help with stairs or ramp for entrance;Direct supervision/assist for medications management   Equipment Recommendations  Other (comment) [facility]?)    Recommendations for Other Services       Precautions / Restrictions Precautions Precautions: Fall Precaution Comments: ileostomy Restrictions Weight Bearing Restrictions: No     Mobility  Bed Mobility   Bed Mobility: Supine to Sit, Sit to Supine     Supine to sit: Supervision Sit to supine: Supervision   General bed mobility comments: incr time, bed flat, verbal cues to initiate    Transfers Overall transfer level: Needs assistance   Transfers: Sit to/from Stand Sit to Stand: Min guard, Supervision           General transfer comment: Min guard needed for transfers for safety. Pt was able to recall proper hand placement w/o cues; STS repeated from bed, w/c-varied ht surfaces    Ambulation/Gait Ambulation/Gait assistance: Min guard Gait Distance (Feet): 40 Feet (15') Assistive device: None, 1 person hand held assist Gait Pattern/deviations: Step-through pattern, Decreased stride length, Trunk flexed, Narrow base of support Gait velocity: decreased     General Gait Details: verbal cues for upward gaze, incr stride length; pt repeatedly states he is tired "allover" and requires seated rest between distances. with encouragement pt able to continue/incr distance   Stairs  Wheelchair Mobility    Modified Rankin (Stroke Patients Only)       Balance    Sitting-balance support: Feet supported, No upper extremity supported Sitting balance-Leahy Scale: Good     Standing balance support: No upper extremity supported Standing balance-Leahy Scale: Fair               High level balance activites: Head turns, Direction changes High Level Balance Comments: min/guard for safety            Cognition Arousal/Alertness: Awake/alert Behavior During Therapy: WFL for tasks assessed/performed Overall Cognitive Status: Within Functional Limits for tasks assessed                                 General Comments: alert, following commands and responds appropriately        Exercises      General Comments        Pertinent Vitals/Pain Pain Assessment Pain Assessment: No/denies pain    Home Living                          Prior Function            PT Goals (current goals can now be found in the care plan section) Acute Rehab PT Goals PT Goal Formulation: With patient/family Time For Goal Achievement: 06/15/22 Potential to Achieve Goals: Good Progress towards PT goals: Progressing toward goals    Frequency    Min 3X/week      PT Plan Current plan remains appropriate;Discharge plan needs to be updated    Co-evaluation              AM-PAC PT "6 Clicks" Mobility   Outcome Measure  Help needed turning from your back to your side while in a flat bed without using bedrails?: None Help needed moving from lying on your back to sitting on the side of a flat bed without using bedrails?: A Little Help needed moving to and from a bed to a chair (including a wheelchair)?: A Little Help needed standing up from a chair using your arms (e.g., wheelchair or bedside chair)?: A Little Help needed to walk in hospital room?: A Little Help needed climbing 3-5 steps with a railing? : A Little 6 Click Score: 19    End of Session Equipment Utilized During Treatment: Gait belt Activity Tolerance: Patient  tolerated treatment well Patient left: with call bell/phone within reach;in bed;with bed alarm set;with family/visitor present;with nursing/sitter in room   PT Visit Diagnosis: Difficulty in walking, not elsewhere classified (R26.2)     Time: 0459-9774 PT Time Calculation (min) (ACUTE ONLY): 48 min  Charges:  $Gait Training: 8-22 mins $Therapeutic Activity: 8-22 mins                     Baxter Flattery, PT  Acute Rehab Dept [facility]) [phone]  [facility] Weekend Pager (Saturday/Sunday only)  [phone]  06/12/2022    [facility] 06/12/2022, 4:16 PM

## 2022-06-12 NOTE — Progress Notes (Signed)
Pt is getting up from bed and sitting in the chair, back and forth multiple times. Able to redirect him. PRN meds given. MD is notified via secure chat.

## 2022-06-12 NOTE — Progress Notes (Signed)
PHARMACY - TOTAL PARENTERAL NUTRITION CONSULT NOTE   Indication: Crohn's disease and complex past surgical history including ex lap with small bowel resection, lysis of adhesion, mesh explantation, and diverting loop ileostomy in April 2023, on TPN prior to admission  Patient Measurements: Height: _0  (177.8 cm) Weight: 67.7 kg (149 lb 4 oz) IBW/kg (Calculated) : 73 TPN AdjBW (KG): 68.1 Body mass index is 21.42 kg/m.  Assessment: Patient with PMH of Crohn's disease and complex surgical history as above on chronic TPN who presented on 05/13/22 with altered mental status after spouse found patient unresponsive with multiple pill bottles open around him. Patient was transported from SNF to ED the day prior for generalized weakness and acute worsening of his chronic abdominal pain. Patient decided to return home instead of his SNF. Pharmacy consulted to resume TPN as inpatient.   Discharge from [facility] 04/19/22:  TPN pharmacist at [facility]:  Estimated Nutrient Requirements: Protein: 1.5 - 1.7 g/kg/day (98 - 104 g/day), Total kcal: 30 - 35 kcal/kg/day (1830 - 1983 kcal/day)  TPN Formulation at [facility] per TPN pharmacist: [A 6.6%, D 20%, F 3.1%] @ goal rate, 65 mL/hr (TV: 1560 mL/day) provides: Protein 1.7 g/kg/day 103 g/day, Total kcal 32 kcal/kg/day 1971 kcal/day  NPC: N ratio 94   Admitted at [facility] 04/29/22-05/03/22: Nutritional Goals: TPN 80 mL/hr (provides 113 g of protein and 1943 kcals per day) RD Assessment: (10/16): 2100-2450kcal (30-35kcal/kg), Protein 105-130g (1.5-1.8g/kg) Utilize intralipid rather than SMOF as 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  [facility]: [name], PharmD, ph: [phone] 10/28 Most recent formula: Total volume 1560 mL:  Dextrose 220 g/day, Plenamine 120 g/day, SMOFlipid 50 g/day, NaCl 75 mEq, CaGluc 6 mEq, Potassium Ac 20 mEq, Potassium Phos 13.5 nM, Mag Sulfate 8 mEq, Vitamins/trace  elements   GI issues: Crohn's dz with chronic TPN, aspiration, dysphagia (followed by speech), severe PCM, ileostomy leakage  - PICC line placed in September 24 with the plan to continue TPN until the patient gains 30 to 40 pounds and then consider ileostomy closure.  Glucose / Insulin: No hx DM.  CBGs at goal <150. (CBGs BID), 0 unit SSI Electrolytes: Lytes wnl including CorrCa  Renal: SCr ok; BUN slightly elevated at 30; UOP ok Hepatic: AST  elevated , ALT elevated, both have been stable, Alk phos remains elevated , Tbili WNL - Albumin low 2.7 - TG 109 (11/23). 100 (11/27)  I/O:  - ileostomy output 1150 mL up, UOP 1500  - LBM 11/26 GI Imaging: - 10/14 CTa/p: previous surgical changes noted (transverse-sigmoid anastomosis) with stable inflammation since 10/12 CT - 10/27 CTa/p: no significant interval change since 10/14 CT. No evidence of bowel obstruction or other complicating feature at the anastomotic site or ostomy. - 11/6 abd [place] - normal liver (checked d/t elevated LFTs) - 11/20 CT: Same postoperative changes. Inflammatory change in the right lower quadrant is not significantly changed since 05/12/2022. New patchy opacity in the right lung base. GI Surgeries / Procedures: n/a  Central access: prior to admission - double lumen CVC (03/2022, 130lbs) TPN start date: started PTA  Nutritional Goals: TPN provides 138 g of protein and 2222  kcals per day.  RD Assessment this admission:    Estimated Needs Total Energy Estimated Needs: 2015-2350kcal Total Protein Estimated Needs: 100-135g Total Fluid Estimated Needs: 2424m (Holliday Segar)  Current Nutrition:  TPN Dysphagia-3 11/9 >> Regular 11/17 >>  Dysphagia 3 11/21 >>  Plan:  Continue Cyclic TPN to 4290 ml over 14 hrs Will provide (incr) 138g AA, 25.91% lipids, 2222 kcal GIR 3-6.1 Electrolytes in TPN:  Na 75 mEq/L K 40 mEq/L Ca 5 mEq/L Mg 5 mEq/L Phos 15 mmol/L Cl:Ac = 1:1 Add standard MVI and trace elements to TPN   With excellent CBG control, will limit finger sticks to twice daily.  Timed 2 hrs after TPN start, 1 hour after TPN stop  IVF dc'd by MD Monitor TPN labs on Mon/Thurs at minimum and PRN BMP, magnesium, phosphorus with AM labs     Royetta Asal, PharmD, BCPS 06/12/2022 7:55 AM

## 2022-06-12 NOTE — Progress Notes (Signed)
Physical Therapy Treatment Patient Details Name: [name] MRN: [id number] DOB: 02-23-1951 Today's Date: 06/12/2022   History of Present Illness 71 year old male with complicated past medical history as outlined below, which is significant for Crohn's disease with persistent abdominal pain.  On April of this year he underwent exploratory laparotomy with small bowel resection, repair of enterotomy, and creation of a diverting loop ileostomy at [facility].  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  He has been residing in [facility] from where he presented to the emergency department on 10/27 for pain at the site of his ostomy with foul-smelling discharge.  Work-up of the ostomy and abdomen including CT imaging was unremarkable.  He was discharged to SNF, however, rather than returning to SNF he found private transport to his home.Then on 10/28 he again presented to [facility] long emergency department with chief complaint of unresponsiveness.11/2 reveals that ingestion of pills at home was in fact a suicide attempt. 1:1 sitter, psych consult    PT Comments    [name] is progressing with PT, worked on amb without device today as well as increasing activity tolerance outside his comfort zone.  He is cooperative and appropriate throughout session. Feel [name] would benefit from  being in a room with blinds open and lights on as orders state ( he is in the dark with  sitter eavery day that PT has seen him) as well as OOB to chair for meals (in addition to PT).  Will contact MD about orders to leave unit/go outside, he is ready for this from PT standpoint--travelling via w/c for safety, he would be able to amb partial distance and self propel w/c as well.    Recommendations for follow up therapy are one component of a multi-disciplinary discharge planning process, led by the attending physician.  Recommendations may be updated based on patient status, additional functional criteria and insurance  authorization.  Follow Up Recommendations  Home health PT     Assistance Recommended at Discharge Intermittent Supervision/Assistance  Patient can return home with the following A little help with walking and/or transfers;A little help with bathing/dressing/bathroom;Assistance with cooking/housework;Assist for transportation;Help with stairs or ramp for entrance;Direct supervision/assist for medications management   Equipment Recommendations  Other (comment) [facility]?)    Recommendations for Other Services       Precautions / Restrictions Precautions Precautions: Fall Precaution Comments: ileostomy Restrictions Weight Bearing Restrictions: No     Mobility  Bed Mobility   Bed Mobility: Supine to Sit     Supine to sit: Supervision     General bed mobility comments: incr time, bed flat, verbal cues to initiate    Transfers Overall transfer level: Needs assistance   Transfers: Sit to/from Stand Sit to Stand: Min guard                Ambulation/Gait Ambulation/Gait assistance: Min guard Gait Distance (Feet): 110 Feet (x2) Assistive device: None (to rail touch in hallway) Gait Pattern/deviations: Step-through pattern, Decreased stride length, Trunk flexed, Narrow base of support Gait velocity: decreased     General Gait Details: verbal cues for upward gaze, incr stride length; pt repeatedly states he is tired "allover" and requires seated rest between distances. with encouragement pt able to continue/incr distance   Stairs             Wheelchair Mobility    Modified Rankin (Stroke Patients Only)       Balance   Sitting-balance support: Feet supported, No upper extremity supported Sitting  balance-Leahy Scale: Good     Standing balance support: No upper extremity supported Standing balance-Leahy Scale: Fair               High level balance activites: Head turns, Direction changes High Level Balance Comments: min/guard for safety             Cognition Arousal/Alertness: Awake/alert Behavior During Therapy: WFL for tasks assessed/performed Overall Cognitive Status: Within Functional Limits for tasks assessed                                 General Comments: alert, following commands and responds appropriately        Exercises      General Comments        Pertinent Vitals/Pain Pain Assessment Pain Assessment: No/denies pain    Home Living                          Prior Function            PT Goals (current goals can now be found in the care plan section) Acute Rehab PT Goals PT Goal Formulation: With patient/family Time For Goal Achievement: 06/15/22 Potential to Achieve Goals: Good Progress towards PT goals: Progressing toward goals    Frequency    Min 3X/week      PT Plan Current plan remains appropriate;Discharge plan needs to be updated    Co-evaluation              AM-PAC PT "6 Clicks" Mobility   Outcome Measure  Help needed turning from your back to your side while in a flat bed without using bedrails?: None Help needed moving from lying on your back to sitting on the side of a flat bed without using bedrails?: A Little Help needed moving to and from a bed to a chair (including a wheelchair)?: A Little Help needed standing up from a chair using your arms (e.g., wheelchair or bedside chair)?: A Little Help needed to walk in hospital room?: A Little Help needed climbing 3-5 steps with a railing? : A Little 6 Click Score: 19    End of Session Equipment Utilized During Treatment: Gait belt Activity Tolerance: Patient tolerated treatment well Patient left: in chair;with call bell/phone within reach;with nursing/sitter in room   PT Visit Diagnosis: Difficulty in walking, not elsewhere classified (R26.2)     Time: 3734-2876 PT Time Calculation (min) (ACUTE ONLY): 21 min  Charges:  $Gait Training: 8-22 mins                     Baxter Flattery, PT  Acute Rehab  Dept [facility]) [phone]  [facility] Weekend Pager ([place] only)  [phone]  06/12/2022    [facility] 06/12/2022, 12:53 PM

## 2022-06-12 NOTE — Plan of Care (Signed)
  Problem: Coping: Goal: Level of anxiety will decrease Outcome: Progressing   Problem: Pain Managment: Goal: General experience of comfort will improve Outcome: Progressing   

## 2022-06-12 NOTE — Consult Note (Signed)
Face-to-Face Psychiatry Consult   Reason for Consult:  Suicide Attempt  Referring Physician:  Red Christians   Patient Identification: [name] MRN:  [id number] Principal Diagnosis: Metabolic encephalopathy Diagnosis:  Principal Problem:   Metabolic encephalopathy Active Problems:   Chronic anemia   Acute respiratory failure with hypoxia (HCC)   Severe malnutrition (HCC)   Generalized weakness   Drug overdose, intentional, initial encounter ([place])   Chronic pain syndrome   Aspiration pneumonia ([place])   Total Time spent with patient: 20 minutes  Subjective:   71yo with hx Crohn's with persistent abd pain s/p extensive surgeries and is currently TPN dependent. Pt presented with unresponsiveness in apparent suicide attempt via overdose.    During evaluation [name] is observed to be lying in bed, dark room with safety sitter in close proximity.  He is alert and oriented, mood congruent with affect.  He tells me he had a good Thanksgiving, enjoyed seeing family.  Despite his good weekend, he continues to rate his depression 2-3/10 with 10 being the worst on a [place] scale.  He also rates his anxiety an 8 out of 10 with 10 being the worst on a [place] scale.  He continues to endorse ongoing anxiety secondary to his pain which she rates as an 8 out of 10 with 10 being the worst, and awaiting surgical appointment with Dr. Jennette Bill.  Patient does report after careful consideration and family involvement, he is beginning to lean towards discharging to his sister's home as his safe and best option.  He does request this provider returned to speak with his son and or call.  He continues to take Zoloft 100 mg, with no obvious side effects or adverse reactions.  He denies any sleeping disturbances, and appears to be eating fairly at this time.  He remains on a dysphagia 3 diet, and is compliant with his Ensure plus drinks which are noted at the bedside.  Patient continues to work in towards weight  gain for surgery of his ileostomy removal.  Patient is speaking in a clear tone at moderate volume, and normal pace; with good eye contact.  His thought process is coherent and relevant; There is no indication that he is currently responding to internal/external stimuli or experiencing delusional thought content.  Patient denies suicidal/self-harm/homicidal ideation, psychosis, and paranoia.  Patient has remained calm throughout assessment and has answered questions appropriately.  He denies any active suicidal thoughts at this time. However reports he has been more tearful, even crying today as he misses his wife. We did review all options to include inpatient psych, outpatient IOP with safe disposition, ongoing medical treatment with private therapists coming into the hospital, or transfer to tertiary center for comprehensive services to include psychiatry.   No family members were present during the reassessment, however patient did provide consent to speak with his son and will return for visit.  This provider did reach out to his son via telephone, to update, address any new concerns.  Son does report patient is considering going to his sister's home in [place].  Appointment has been scheduled with Dr. Jennette Bill at [facility] for June 28, 2022.  Once mutually agreed upon all parties, will proceed with beginning plans for safe disposition and outpatient psychiatric services.    Psychiatry consult service will continue to follow at this time.  Recommendation will be for patient to continue inpatient psychiatric services, ongoing referral to [facility] has been placed.  Patient is currently  voluntary, however in the event he attempts to leave the hospital he will need to be placed under involuntary commitment as he remains a danger to his self at this time of the evaluation.  Safety Assessment: Individualized risk factors include: age, caucasian , male gender and  previous  suicide attempt, ongoing depression and anxiety, serious illness such as chronic pain, sense of hopelessness.. Individualized protective factors include: Patient has treatable psychiatric disorders and symptoms, No access to firearms or other means of harm, Future oriented, Reality-based and No history of head injury. Taking the aforementioned non-modifiable and modifiable risk factors in conjunction with his protective factors, the patient is currently felt to be of moderate to high imminent risk of harm to self. To further mitigate risk, please see the below treatment recommendations.    HPI: Currently on interview, the patient. Alert and oriented x4. Patient reports presenting to the emergency department after a suicide attempt via after taking multiple pills in an attempt to end his life. Over the past couple months he reports worsening depression and anxiety. He reports symptoms of depression including hopelessness, guilt/worthlessness, disturbed sleep pattern (sleeping 3 to 4 hours during the day) loss of interest, low energy, decreased appetite, isolative, crying spells deterioration of ADLs and recurrent thoughts of death.  He reports that his symptoms have increased due to multiple stomach surgeries, deconditioning, and increase in pain. Patient is on TPN to improve nutrition, assist with weight gain. He is also on a dysphagia 3 diet at this time, with no assistance required to feed himself. Patient has met his medically goals and now medically stable for transfer to inpatient psychiatric unit.   Past Psychiatric History: Depression  Risk to Self:   yes suicide attempt Risk to Others:   Denies Prior Inpatient Therapy:   Denies Prior Outpatient Therapy:   Denies  Past Medical History:  Past Medical History:  Diagnosis Date   Acute prostatitis 07/24/2007   Qualifier: Diagnosis of  By: [name] MD, [name]    Allergy    mild   Arthritis    osteoarthritis   Asthma    Blood transfusion without  reported diagnosis    BPH (benign prostatic hypertrophy) with urinary obstruction    Crohn's ileitis ([place]) suspected 05/03/2017   Dilated aortic root ([place])    noted on echo 08/2012   Diverticulitis of colon    EPIDIDYMITIS 02/15/2010   Qualifier: Diagnosis of  By: [name] MD, [name]    GERD (gastroesophageal reflux disease)    H/O: GI bleed    Hemorrhoids    Hepatitis 1975   unknown type    HERPES SIMPLEX INFECTION 10/14/2007   Qualifier: Diagnosis of  By: [name] MD, [name]    Hiatal hernia    Ileus following gastrointestinal surgery ([place]) 12/26/2011   Long term (current) use of systemic steroids 06/18/2018   Psoriasis    sees [name] at [facility].   Recurrent ventral incisional hernia 05/10/2012   SVT (supraventricular tachycardia)    Ulcer 08/21/2013   ileal    Past Surgical History:  Procedure Laterality Date   BOWEL RESECTION  12/19/2011   Procedure: SMALL BOWEL RESECTION;  Surgeon: [name], MD;  Location: [facility];  Service: General;  Laterality: N/A;  with anastamosis and insertion mesh   BRONCHOSCOPY     COLON SURGERY  01/2004   x 2   COLONOSCOPY W/ BIOPSIES  04/26/2017   per [name], no polyps, benign inflammation, repeat in 5  yrs    CYSTOSCOPY     ESOPHAGOGASTRODUODENOSCOPY     HEMICOLECTOMY     left side, at [facility], diverticulitis   HEMORRHOID BANDING     HERNIA REPAIR     [phone] incisional hernia   ILEOSTOMY     ILEOSTOMY CLOSURE     INSERTION OF MESH  07/31/2012   Procedure: INSERTION OF MESH;  Surgeon: [name], MD;  Location: [facility];  Service: General;;   LAPAROTOMY  12/19/2011   Procedure: EXPLORATORY LAPAROTOMY;  Surgeon: [name], MD;  Location: [facility];  Service: General;  Laterality: N/A;   PACEMAKER IMPLANT N/A 12/01/2020   Procedure: PACEMAKER IMPLANT;  Surgeon: [name], MD;  Location: [place] [facility];  Service: Cardiovascular;  Laterality: N/A;   PACEMAKER INSERTION Left    TONSILLECTOMY      UPPER GASTROINTESTINAL ENDOSCOPY     VASECTOMY     VENTRAL HERNIA REPAIR  07/31/2012   Procedure: HERNIA REPAIR VENTRAL ADULT;  Surgeon: [name], MD;  Location: [facility];  Service: General;  Laterality: N/A;   Family History:  Family History  Problem Relation Age of Onset   Lung cancer Mother    Leukemia Father    Hypertension Father    Heart disease Father    Heart attack Father    Prostate cancer Father    Prostate cancer Paternal Uncle    Colon cancer Neg Hx    Stomach cancer Neg Hx    Colon polyps Neg Hx    Esophageal cancer Neg Hx    Rectal cancer Neg Hx    Family Psychiatric  History: none reported  Social History:  Social History   Substance and Sexual Activity  Alcohol Use Not Currently   Comment: occ     Social History   Substance and Sexual Activity  Drug Use Yes   Types: Oxycodone    Social History   Socioeconomic History   Marital status: Married    Spouse name: Not on file   Number of children: 2   Years of education: Not on file   Highest education level: Not on file  Occupational History   Occupation: EHS Freight forwarder    Employer: [employer]  Tobacco Use   Smoking status: Never   Smokeless tobacco: Never  Vaping Use   Vaping Use: Never used  Substance and Sexual Activity   Alcohol use: Not Currently    Comment: occ   Drug use: Yes    Types: Oxycodone   Sexual activity: Not on file  Other Topics Concern   Not on file  Social History Narrative   He is married with 2 sons 1 son is an Art gallery manager and 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   He is Nurse, 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   Rare if any caffeine   Rare alcohol and never smoker   Social Determinants of Radio broadcast assistant Strain: Low Risk  (04/10/2022)   Overall Financial Resource Strain (CARDIA)    Difficulty of Paying Living Expenses: Not  hard at all  Food Insecurity: No Food Insecurity (05/14/2022)   Hunger Vital Sign    Worried About Running Out of Food in the Last Year: Never true    Ran Out of Food in the Last Year: Never true  Transportation Needs: No Transportation Needs (05/14/2022)   [place] -  Hydrologist (Medical): No    Lack of Transportation (Non-Medical): No  Physical Activity: Inactive (04/10/2022)   Exercise Vital Sign    Days of Exercise per Week: 0 days    Minutes of Exercise per Session: 0 min  Stress: Stress Concern Present (04/10/2022)   [place]    Feeling of Stress : To some extent  Social Connections: Not on file   Additional Social History:    Allergies:   Allergies  Allergen Reactions   Purinethol [Mercaptopurine] Other (See Comments)    Pancreatitis    Shellfish Allergy Anaphylaxis and Swelling    Can use standard SMOF lipid formulation for TPN without any issue.    Humira [Adalimumab] Other (See Comments)    Developed antibodies   Tape Rash   Wound Dressing Adhesive Rash    Labs:  Results for orders placed or performed during the hospital encounter of 05/13/22 (from the past 48 hour(s))  Glucose, capillary     Status: Abnormal   Collection Time: 06/10/22  7:41 PM  Result Value Ref Range   Glucose-Capillary 122 (H) 70 - 99 mg/dL    Comment: Glucose reference range applies only to samples taken after fasting for at least 8 hours.  Comprehensive metabolic panel     Status: Abnormal   Collection Time: 06/11/22  3:03 AM  Result Value Ref Range   Sodium 140 135 - 145 mmol/L   Potassium 3.8 3.5 - 5.1 mmol/L   Chloride 111 98 - 111 mmol/L   CO2 25 22 - 32 mmol/L   Glucose, Bld 107 (H) 70 - 99 mg/dL    Comment: Glucose reference range applies only to samples taken after fasting for at least 8 hours.   BUN 30 (H) 8 - 23 mg/dL   Creatinine, Ser 0.86 0.61 - 1.24 mg/dL   Calcium 8.3 (L) 8.9 -  10.3 mg/dL   Total Protein 6.5 6.5 - 8.1 g/dL   Albumin 2.8 (L) 3.5 - 5.0 g/dL   AST 47 (H) 15 - 41 U/L   ALT 47 (H) 0 - 44 U/L   Alkaline Phosphatase 179 (H) 38 - 126 U/L   Total Bilirubin 0.5 0.3 - 1.2 mg/dL   GFR, Estimated >60 >60 mL/min    Comment: (NOTE) Calculated using the CKD-EPI Creatinine Equation (2021)    Anion gap 4 (L) 5 - 15    Comment: Performed at [facility], [place] [address] [place], [place] [address]  Glucose, capillary     Status: None   Collection Time: 06/11/22  9:07 AM  Result Value Ref Range   Glucose-Capillary 90 70 - 99 mg/dL    Comment: Glucose reference range applies only to samples taken after fasting for at least 8 hours.  Glucose, capillary     Status: Abnormal   Collection Time: 06/11/22  7:42 PM  Result Value Ref Range   Glucose-Capillary 112 (H) 70 - 99 mg/dL    Comment: Glucose reference range applies only to samples taken after fasting for at least 8 hours.  Magnesium     Status: None   Collection Time: 06/12/22  2:47 AM  Result Value Ref Range   Magnesium 2.0 1.7 - 2.4 mg/dL    Comment: Performed at [facility], [place] [address] [place], [place] [address]  Phosphorus     Status: None   Collection Time: 06/12/22  2:47 AM  Result Value  Ref Range   Phosphorus 3.2 2.5 - 4.6 mg/dL    Comment: Performed at [facility], [place] [address], [place], [place] [address]  Comprehensive metabolic panel     Status: Abnormal   Collection Time: 06/12/22  2:47 AM  Result Value Ref Range   Sodium 141 135 - 145 mmol/L   Potassium 3.8 3.5 - 5.1 mmol/L   Chloride 109 98 - 111 mmol/L   CO2 24 22 - 32 mmol/L   Glucose, Bld 119 (H) 70 - 99 mg/dL    Comment: Glucose reference range applies only to samples taken after fasting for at least 8 hours.   BUN 30 (H) 8 - 23 mg/dL   Creatinine, Ser 0.86 0.61 - 1.24 mg/dL   Calcium 8.3 (L) 8.9 - 10.3 mg/dL   Total Protein 6.5 6.5 - 8.1 g/dL   Albumin 2.7 (L) 3.5 -  5.0 g/dL   AST 45 (H) 15 - 41 U/L   ALT 45 (H) 0 - 44 U/L   Alkaline Phosphatase 163 (H) 38 - 126 U/L   Total Bilirubin 0.6 0.3 - 1.2 mg/dL   GFR, Estimated >60 >60 mL/min    Comment: (NOTE) Calculated using the CKD-EPI Creatinine Equation (2021)    Anion gap 8 5 - 15    Comment: Performed at [facility], [place] [address], [place], [place] [address]  Triglycerides     Status: None   Collection Time: 06/12/22  2:47 AM  Result Value Ref Range   Triglycerides 100 <150 mg/dL    Comment: Performed at [facility], [place] [address] [place], [place] [address]  Glucose, capillary     Status: None   Collection Time: 06/12/22  8:58 AM  Result Value Ref Range   Glucose-Capillary 94 70 - 99 mg/dL    Comment: Glucose reference range applies only to samples taken after fasting for at least 8 hours.    Current Facility-Administered Medications  Medication Dose Route Frequency Provider Last Rate Last Admin   0.9 %  sodium chloride infusion   Intravenous PRN Lavina Hamman, MD       acetaminophen (TYLENOL) tablet 650 mg  650 mg Oral Q6H PRN Arrien, Jimmy Picket, MD       benzonatate (TESSALON) capsule 100 mg  100 mg Oral TID PRN Eulogio Bear U, DO   100 mg at 06/10/22 1016   busPIRone (BUSPAR) tablet 5 mg  5 mg Oral BID Eugenie Filler, MD   5 mg at 06/12/22 2841   Chlorhexidine Gluconate Cloth 2 % PADS 6 each  6 each Topical Daily Cherene Altes, MD   6 each at 06/12/22 0943   cyanocobalamin (VITAMIN B12) tablet 1,000 mcg  1,000 mcg Oral Daily Lavina Hamman, MD   1,000 mcg at 06/12/22 0942   enoxaparin (LOVENOX) injection 40 mg  40 mg Subcutaneous Q24H Cherene Altes, MD   40 mg at 06/08/22 2042   feeding supplement (ENSURE ENLIVE / ENSURE PLUS) liquid 237 mL  237 mL Oral Q24H Tawni Millers, MD   237 mL at 06/12/22 1400   finasteride (PROSCAR) tablet 5 mg  5 mg Oral Daily Lavina Hamman, MD   5 mg at 06/12/22 3244   hydrOXYzine  (ATARAX) tablet 25 mg  25 mg Oral TID PRN Tawni Millers, MD   25 mg at 06/11/22 2255   insulin aspart (novoLOG) injection 0-9 Units  0-9 Units Subcutaneous 2 times per  day Emiliano Dyer, RPH   1 Units at 06/10/22 1011   lidocaine (LIDODERM) 5 % 1 patch  1 patch Transdermal Q24H Cristal Generous, NP   1 patch at 06/11/22 1806   methocarbamol (ROBAXIN) tablet 500 mg  500 mg Oral TID Lavina Hamman, MD   500 mg at 06/12/22 0942   morphine (MSIR) tablet 15 mg  15 mg Oral Q3H PRN Lavina Hamman, MD   15 mg at 06/12/22 0942   ondansetron (ZOFRAN) injection 4 mg  4 mg Intravenous Q6H PRN Lavina Hamman, MD   4 mg at 06/12/22 1537   Oral care mouth rinse  15 mL Mouth Rinse PRN Candee Furbish, MD   15 mL at 06/09/22 0805   pantoprazole (PROTONIX) EC tablet 40 mg  40 mg Oral Daily Eugenie Filler, MD   40 mg at 06/12/22 0942   psyllium (HYDROCIL/METAMUCIL) 1 packet  1 packet Oral Daily Eugenie Filler, MD   1 packet at 06/10/22 1003   sertraline (ZOLOFT) tablet 100 mg  100 mg Oral Daily Suella Broad, FNP   100 mg at 06/12/22 1331   simethicone (MYLICON) chewable tablet 80 mg  80 mg Oral TID WC & HS Eugenie Filler, MD   80 mg at 06/12/22 1331   sodium chloride flush (NS) 0.9 % injection 10-40 mL  10-40 mL Intracatheter PRN Cherene Altes, MD   20 mL at 06/12/22 0852   tamsulosin (FLOMAX) capsule 0.4 mg  0.4 mg Oral QPC supper Eugenie Filler, MD   0.4 mg at 53/20/23 3435   TPN CYCLIC-ADULT (ION)   Intravenous Cyclic-See Admin Instructions Damita Lack, MD        Musculoskeletal: Strength & Muscle Tone: Laying in bed   Gait & Station: Laying in bed   Patient leans: Laying in bed      Psychiatric Specialty Exam: Psychiatric Specialty Exam: Physical Exam Vitals and nursing note reviewed.  Constitutional:      Appearance: Normal appearance. He is normal weight.  Skin:    General: Skin is warm and dry.     Capillary Refill: Capillary refill takes  less than 2 seconds.  Neurological:     General: No focal deficit present.     Mental Status: He is alert and oriented to person, place, and time. Mental status is at baseline.  Psychiatric:        Attention and Perception: Attention and perception normal.        Mood and Affect: Affect normal. Mood is anxious and depressed.        Speech: Speech normal.        Behavior: Behavior normal. Behavior is cooperative.        Thought Content: Thought content normal.        Cognition and Memory: Cognition normal.        Judgment: Judgment normal.     Review of Systems  Psychiatric/Behavioral:  Positive for depression. Negative for hallucinations, substance abuse and suicidal ideas (denies). The patient is nervous/anxious. The patient does not have insomnia.   All other systems reviewed and are negative.   Blood pressure 116/65, pulse 77, temperature 98.1 F (36.7 C), resp. rate 18, height _0  (1.778 m), weight 69.6 kg, SpO2 97 %.Body mass index is 22.02 kg/m.  General Appearance: Fairly Groomed  Eye Contact:  Good  Speech:  Clear and Coherent and Normal Rate  Volume:  Normal  Mood:  Anxious  and Depressed  Affect:  Appropriate and Congruent  Thought Process:  Coherent, Goal Directed, Linear, and Descriptions of Associations: Intact  Orientation:  Full (Time, Place, and Person)  Thought Content:  Logical  Suicidal Thoughts:  No  Homicidal Thoughts:  No  Memory:  Immediate;   Fair Recent;   Fair  Judgement:  Fair  Insight:  Fair  Psychomotor Activity:  Normal  Concentration:  Concentration: Fair and Attention Span: Fair  Recall:  [employer] of Knowledge:  Good  Language:  Good  Akathisia:  No  Handed:  Right  AIMS (if indicated):     Assets:  Communication Skills Desire for Improvement Financial Resources/Insurance Leisure Time Resilience Social Support  ADL's:  Intact  Cognition:  WNL  Sleep:         Physical Exam: Physical Exam Vitals and nursing note reviewed.   Constitutional:      Appearance: Normal appearance. He is normal weight.  Skin:    General: Skin is warm and dry.     Capillary Refill: Capillary refill takes less than 2 seconds.  Neurological:     General: No focal deficit present.     Mental Status: He is alert and oriented to person, place, and time. Mental status is at baseline.  Psychiatric:        Attention and Perception: Attention and perception normal.        Mood and Affect: Affect normal. Mood is anxious and depressed.        Speech: Speech normal.        Behavior: Behavior normal. Behavior is cooperative.        Thought Content: Thought content normal.        Cognition and Memory: Cognition normal.        Judgment: Judgment normal.    Review of Systems  Psychiatric/Behavioral:  Positive for depression. Negative for hallucinations, substance abuse and suicidal ideas (denies). The patient is nervous/anxious. The patient does not have insomnia.   All other systems reviewed and are negative.   Blood pressure 116/65, pulse 77, temperature 98.1 F (36.7 C), resp. rate 18, height _0  (1.778 m), weight 69.6 kg, SpO2 97 %. Body mass index is 22.02 kg/m.  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 [facility] as patient has been difficult to place. -Continue sertraline 100 mg p.o. daily.  -Medical concerns were addressed with current hospitalist. -Will meet with family tomorrow patient's son and sister, as well as address any additional comments, questions, and or concerns prior to starting disposition services and resources.   -Will order care consult for the patient to go outside.  He will need to remain with safety  sitter and additional staff.  Disposition: Recommend psychiatric Inpatient admission when medically cleared.  Suella Broad, FNP 06/12/2022 4:19 PM

## 2022-06-13 DIAGNOSIS — R401 Stupor: Secondary | ICD-10-CM | POA: Diagnosis not present

## 2022-06-13 DIAGNOSIS — G9341 Metabolic encephalopathy: Secondary | ICD-10-CM | POA: Diagnosis not present

## 2022-06-13 LAB — PHOSPHORUS: Phosphorus: 2.6 mg/dL (ref 2.5–4.6)

## 2022-06-13 LAB — GLUCOSE, CAPILLARY
Glucose-Capillary: 100 mg/dL — ABNORMAL HIGH (ref 70–99)
Glucose-Capillary: 134 mg/dL — ABNORMAL HIGH (ref 70–99)

## 2022-06-13 LAB — COMPREHENSIVE METABOLIC PANEL
ALT: 41 U/L (ref 0–44)
AST: 36 U/L (ref 15–41)
Albumin: 2.8 g/dL — ABNORMAL LOW (ref 3.5–5.0)
Alkaline Phosphatase: 149 U/L — ABNORMAL HIGH (ref 38–126)
Anion gap: 8 (ref 5–15)
BUN: 31 mg/dL — ABNORMAL HIGH (ref 8–23)
CO2: 24 mmol/L (ref 22–32)
Calcium: 8.4 mg/dL — ABNORMAL LOW (ref 8.9–10.3)
Chloride: 110 mmol/L (ref 98–111)
Creatinine, Ser: 0.82 mg/dL (ref 0.61–1.24)
GFR, Estimated: >60 mL/min (ref >60)
Glucose, Bld: 136 mg/dL — ABNORMAL HIGH (ref 70–99)
Potassium: 4.1 mmol/L (ref 3.5–5.1)
Sodium: 142 mmol/L (ref 135–145)
Total Bilirubin: 0.4 mg/dL (ref 0.3–1.2)
Total Protein: 6.3 g/dL — ABNORMAL LOW (ref 6.5–8.1)

## 2022-06-13 LAB — MAGNESIUM: Magnesium: 2.1 mg/dL (ref 1.7–2.4)

## 2022-06-13 MED ORDER — TRAVASOL 10 % IV SOLN
INTRAVENOUS | Status: AC
  Filled 2022-06-13: qty 1380

## 2022-06-13 NOTE — Plan of Care (Signed)
  Problem: Education: Goal: Knowledge of General Education information will improve Description: Including pain rating scale, medication(s)/side effects and non-pharmacologic comfort measures Outcome: Progressing   Problem: Activity: Goal: Risk for activity intolerance will decrease Outcome: Progressing   Problem: Nutrition: Goal: Adequate nutrition will be maintained Outcome: Progressing   

## 2022-06-13 NOTE — Progress Notes (Signed)
PHARMACY - TOTAL PARENTERAL NUTRITION CONSULT NOTE   Indication: Crohn's disease and complex past surgical history including ex lap with small bowel resection, lysis of adhesion, mesh explantation, and diverting loop ileostomy in April 2023, on TPN prior to admission  Patient Measurements: Height: 5' 10" (177.8 cm) Weight: 69.6 kg (153 lb 7 oz) IBW/kg (Calculated) : 73 TPN AdjBW (KG): 68.1 Body mass index is 22.02 kg/m.  Assessment: Patient with PMH of Crohn's disease and complex surgical history as above on chronic TPN who presented on 05/13/22 with altered mental status after spouse found patient unresponsive with multiple pill bottles open around him. Patient was transported from SNF to ED the day prior for generalized weakness and acute worsening of his chronic abdominal pain. Patient decided to return home instead of his SNF. Pharmacy consulted to resume TPN as inpatient.   Discharge from [facility] 04/19/22:  TPN pharmacist at [facility]:  Estimated Nutrient Requirements: Protein: 1.5 - 1.7 g/kg/day (98 - 104 g/day), Total kcal: 30 - 35 kcal/kg/day (1830 - 1983 kcal/day)  TPN Formulation at [facility] per TPN pharmacist: [A 6.6%, D 20%, F 3.1%] @ goal rate, 65 mL/hr (TV: 1560 mL/day) provides: Protein 1.7 g/kg/day 103 g/day, Total kcal 32 kcal/kg/day 1971 kcal/day  NPC: N ratio 94   Admitted at [facility] 04/29/22-05/03/22: Nutritional Goals: TPN 80 mL/hr (provides 113 g of protein and 1943 kcals per day) RD Assessment: (10/16): 2100-2450kcal (30-35kcal/kg), Protein 105-130g (1.5-1.8g/kg) Utilize intralipid rather than SMOF as 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  [facility]: [name], PharmD, ph: [phone] 10/28 Most recent formula: Total volume 1560 mL:  Dextrose 220 g/day, Plenamine 120 g/day, SMOFlipid 50 g/day, NaCl 75 mEq, CaGluc 6 mEq, Potassium Ac 20 mEq, Potassium Phos 13.5 nM, Mag Sulfate 8 mEq, Vitamins/trace  elements   GI issues: Crohn's dz with chronic TPN, aspiration, dysphagia (followed by speech), severe PCM, ileostomy leakage  - PICC line placed in September 24 with the plan to continue TPN until the patient gains 30 to 40 pounds and then consider ileostomy closure.  Glucose / Insulin: No hx DM.  CBGs at goal <150. (CBGs BID), 0 unit SSI Electrolytes: Lytes wnl including CorrCa  Renal: SCr ok; BUN slightly elevated at 30; UOP ok Hepatic: LFTs trending down, Alk phos remains elevated , Tbili WNL - Albumin low 2.8 - TG 109 (11/23). 100 (11/27)  I/O:  - ileostomy output 775  mL up, UOP 400 - LBM 11/27 GI Imaging: - 10/14 CTa/p: previous surgical changes noted (transverse-sigmoid anastomosis) with stable inflammation since 10/12 CT - 10/27 CTa/p: no significant interval change since 10/14 CT. No evidence of bowel obstruction or other complicating feature at the anastomotic site or ostomy. - 11/6 abd [place] - normal liver (checked d/t elevated LFTs) - 11/20 CT: Same postoperative changes. Inflammatory change in the right lower quadrant is not significantly changed since 05/12/2022. New patchy opacity in the right lung base. GI Surgeries / Procedures: n/a  Central access: prior to admission - double lumen CVC (03/2022, 130lbs) TPN start date: started PTA  Nutritional Goals: TPN provides 138 g of protein and 2222  kcals per day.  RD Assessment this admission:    Estimated Needs Total Energy Estimated Needs: 2015-2350kcal Total Protein Estimated Needs: 100-135g Total Fluid Estimated Needs: 2475m (Holliday Segar)  Current Nutrition:  TPN Dysphagia-3 11/9 >> Regular 11/17 >>  Dysphagia 3 11/21 >>   Plan:  Continue Cyclic TPN to  2300 ml over 14 hrs Will provide (incr) 138g AA, 25.91% lipids, 2222 kcal GIR 3-6.1 Electrolytes in TPN:  Na 75 mEq/L K 40 mEq/L Ca 5 mEq/L Mg 5 mEq/L Phos 15 mmol/L Cl:Ac = 1:1 Add standard MVI and trace elements to TPN  With excellent CBG control, will  limit finger sticks to twice daily.  Timed 2 hrs after TPN start, 1 hour after TPN stop  IVF dc'd by MD Monitor TPN labs on Mon/Thurs at minimum and PRN BMP, magnesium, phosphorus with AM labs     Royetta Asal, PharmD, BCPS 06/13/2022 10:58 AM

## 2022-06-13 NOTE — Consult Note (Signed)
Nurse practitioner presented to her room for scheduled family in regards to safe disposition and discharge planning.  This psychiatric provider met with patient in hospital room, along side with his Lunette Stands; home initiated a phone call with his son Lysbeth Galas and wife Katrina.  Provided introduced self and reason for call.  Patient is able to state and verbalized for all parties, that he is interested in discharging to his sister's home.  He then begins to inquire about set up for outpatient services "and what this will look like?"  Writer did discuss importance of establishing appointments in outpatient care for appropriate care coordination and transition of care.  His sister does state she is open to transferring him to his appointments to include upcoming appointment at [facility] on December 13.  This Probation officer did open the floor for additional concerns, comments, and questions for all parties involved.  Wife did express her concern that she has not been involved in discharge planning and or disposition.  She further states" while I do not agree with this decision I do not think there is anything I can do.  You have not involved me in making decisions.  It has been reported, and you will be hearing about it."  Son is also given the opportunity, in which he had denies any concerns and congratulates his father for making a tough decision.  Son does continue to offer his support, and love for his father.  At which time son and wife began to argue, his wife felt as if she has been present with him for the past 6 months and no one was there.  She states no one has shown her gratitude or thanked her for being supportive.  The patient was able to interject, and tell both his wife and son to work this out.  At which point in time patient's wife disconnected phone call.  The patient's sister was also able to answer questions, in which she declined and was thankful for her care and services that were received.   She did provide all contact information for appointments going forward.  She further reports she stayed approximately 5 minutes from the hospital.   Will update chart to reflect the following information: [place] [address] H: [phone] C: [phone] Baldo Daub: [phone]  Following family session, patient does state" I want to get this on paper that I was responsible for my medication.  Katrina was not responsible for my medication.  She was not involved in my suicide attempt.  We sleep in separate rooms, she had no idea."  Did allow patient to verbalize and express himself in a caring and therapeutic manner.  He did become very tearful when discussing his relationship with his wife, and he feels as if she is not being treated fairly.  Did discuss with patient her involvement in his care has always been open, available and welcoming.  Patient was advised during the first interaction after initial psychiatric consult was placed, patient's wife was guarded, resistant, and angered at that time.  She has continued throughout the duration of his stay, and has disagreed with the majority of his treatment plan.  He does state that he understands, however wants her to be treated better in the future.  He is advised if there is any suggestions or recommendations that he has that we can solve this problem going forward I am open to those suggestions they are willing to with his wife.  However he is reminded that she has asked to not be contacted by this provider, and will continue to honor her request until otherwise advised. Provider will continue to initiate and coordinate care with patient, and family members at this time.    Provider was also notified prior to family meeting, that patient's wife had expressed much dislike towards care she had received, and was threatening litigation, And did not like not being included in decision planning discussions.  It is  reported that she was additionally upset for feeling being blamed as for his suicide attempt.  TOC has been notified that patient does have capacity to make his own decisions, and all parties do not have to agree on the decision.  This was discussed with patient, who is able to verbalize understanding surrounding his ongoing risk for increased suicidality and need for safe disposition.  TOC stated a formal grievance will be submitted, and will reach out to risk management for further suggestions and recommendations.   -Psychiatry consult service will continue to follow.

## 2022-06-13 NOTE — Progress Notes (Signed)
PROGRESS NOTE    [name]  XIP:[id number] DOB: January 05, 1951 DOA: 05/13/2022 PCP: [name], MD   Brief Narrative:  71 year old with history of Crohn's disease with history of exploratory laparotomy with small bowel resection, creation of diverting loop/ileostomy at [facility] with persistent abdominal pain and currently TPN dependent admitted for being unresponsive likely from suicide attempt from overdose.  CT of the head was negative.  Initially patient was intubated and transferred to the ICU on 10/30.  ICU stay was also complicated by atrial fibrillation with RVR.  Eventually he was extubated on 11/1 and seen by psychiatry who recommended inpatient admission.  This has been difficult to achieve as patient is on TPN.   Assessment & Plan:  Principal Problem:   Metabolic encephalopathy Active Problems:   Chronic anemia   Acute respiratory failure with hypoxia (HCC)   Severe malnutrition (HCC)   Generalized weakness   Drug overdose, intentional, initial encounter ([place])   Chronic pain syndrome   Aspiration pneumonia ([place])     Intentional drug overdose. Suicide ideation. MDD. -Initially was in ICU intubated for short period time then extubated in stable condition.  Seen by psychiatry, recommending inpatient psych [facility] working on placement Current medications per psychiatry-on BuSpar and Zoloft   Acute toxic and metabolic encephalopathy. Clinical resolved.  Initial CT head and MRI brain negative   Aspiration pneumonia.  Right lower lobe infiltrate Resolved and completed IV antibiotic treatment.   Dysphagia. Seen by speech therapy who is recommending dysphagia 3 diet   Crohn's disease. Previous extensive abdominal surgery at [facility].  Chronically on tube urine.   Severe protein calorie malnutrition. On TPN   Chronic prostatitis. Bladder calculi. BPH. Seen by [name] on 10/15. Patient is on Bactrim for 28 days since then although missed  some doses while he was intubated. per urology(11/15): total 14-day treatment course for Bactrim. Patient had a Foley catheter placed in the ICU most likely due to critical condition. Patient is on Flomax also. Foley removed on 11/16.  Patient able to void after the removal. Discontinue oxybutynin as that can cause retention and increased PVR. Patient will follow-up with [name] for outpatient procedure for BPH.   Ileostomy leakage with irritation and dermatitis.. Wound care consulted. Currently being treated with stoma powder and frequent emptying. Stoma appears healthy and pink.  No surrounding skin cellulitis seen.  Patient is following up with [name] [facility].  Patient had a PICC line placed in September 24 with the plan to continue TPN until the patient gains 30 to 40 pounds and then consider ileostomy closure. Patient was 130 pound at the placement of the PICC line. -[name] on 11/21:  informed her with regards to current ileostomy which appears to be healthy, as well as inform her with regards to weight gain.  Her primary concern is patient's need for weight gain as well as rehabilitation before considering any surgical intervention.  She currently agrees with the plan to go to [facility] or any inpatient psychiatric where the patient continues to have rehabilitation as well. Her main concern is patient should go to rehab to regain strength.   COVID exposure. Patient's wife and son both got tested positive on 11/15 for COVID-19. Family has been visiting the patient throughout the hospital stay. COVID-19 negative.  Currently no indication for isolation or repeating another COVID test.   Chronic pelvic pain. Patient continues to report worsening of his pelvic pain. X-ray hip and pelvis is  unremarkable for any acute etiology. Patient had a CT abdomen and pelvis as well as CT chest angio abdomen and pelvis earlier last month during this hospital stay which were  unremarkable. Patient is requiring IV Dilaudid. Patient reported to the same to psychiatry that the pain is actually causing him anxiety. Repeat CT abdomen pelvis on 06/05/2022 is negative for any acute abnormality to explain patient's pelvic pain.  Does have some chronic inflammation which is unchanged.     Transient A-fib. Resolved. Monitor.      DVT prophylaxis: Lovenox Code Status: Full code Family Communication: Son and sister at bedside  Status is: Inpatient Remains inpatient appropriate because: Currently awaiting inpatient bed   Nutritional status    Signs/Symptoms: percent weight loss, severe fat depletion, severe muscle depletion Percent weight loss: 8 % (in 3 months)  Interventions: TPN (increase nutrients)  Body mass index is 22.02 kg/m.         Subjective: Seen and examined at bedside, no complaints this morning.  Able to communicate slightly better.  Son and sister at bedside.  While I was talking with the family, patient went out for a walk with the sitter Examination: Constitutional: Not in acute distress Respiratory: Clear to auscultation bilaterally Cardiovascular: Normal sinus rhythm, no rubs Abdomen: Nontender nondistended good bowel sounds Musculoskeletal: No edema noted Skin: No rashes seen Neurologic: CN 2-12 grossly intact.  And nonfocal Psychiatric: Normal judgment and insight. Alert and oriented x 3. Normal mood.  Objective: Vitals:   06/12/22 1355 06/12/22 1456 06/12/22 2044 06/13/22 0628  BP: 116/65  (!) 127/54 125/63  Pulse: 77  81 83  Resp: 18  20 16   Temp: 98.1 F (36.7 C)  97.9 F (36.6 C) 98.1 F (36.7 C)  TempSrc:   Oral Oral  SpO2: 97%  98% 96%  Weight:  69.6 kg    Height:        Intake/Output Summary (Last 24 hours) at 06/13/2022 1333 Last data filed at 06/13/2022 1314 Gross per 24 hour  Intake 2396.87 ml  Output 1525 ml  Net 871.87 ml   Filed Weights   06/06/22 1402 06/09/22 1006 06/12/22 1456  Weight:  70.6 kg 67.7 kg 69.6 kg     Data Reviewed:   CBC: Recent Labs  Lab 06/07/22 0423  WBC 3.8*  HGB 8.8*  HCT 29.5*  MCV 81.5  PLT 409*   Basic Metabolic Panel: Recent Labs  Lab 06/08/22 0422 06/09/22 0440 06/10/22 0227 06/11/22 0303 06/12/22 0247 06/13/22 0435  NA 136 139 139 140 141 142  K 4.0 4.3 3.9 3.8 3.8 4.1  CL 106 109 108 111 109 110  CO2 24 25 26 25 24 24   GLUCOSE 93 110* 102* 107* 119* 136*  BUN 23 28* 27* 30* 30* 31*  CREATININE 0.94 0.87 0.94 0.86 0.86 0.82  CALCIUM 8.2* 8.4* 8.3* 8.3* 8.3* 8.4*  MG 2.0  --  1.9  --  2.0 2.1  PHOS 3.1  --  2.8  --  3.2 2.6   GFR: Estimated Creatinine Clearance: 83.7 mL/min (by C-G formula based on SCr of 0.82 mg/dL). Liver Function Tests: Recent Labs  Lab 06/09/22 0440 06/10/22 0227 06/11/22 0303 06/12/22 0247 06/13/22 0435  AST 37 42* 47* 45* 36  ALT 37 41 47* 45* 41  ALKPHOS 155* 168* 179* 163* 149*  BILITOT 0.4 0.4 0.5 0.6 0.4  PROT 6.4* 6.7 6.5 6.5 6.3*  ALBUMIN 2.8* 3.0* 2.8* 2.7* 2.8*   No results for input(s): "LIPASE", "  AMYLASE" in the last 168 hours. No results for input(s): "AMMONIA" in the last 168 hours. Coagulation Profile: No results for input(s): "INR", "PROTIME" in the last 168 hours. Cardiac Enzymes: No results for input(s): "CKTOTAL", "CKMB", "CKMBINDEX", "TROPONINI" in the last 168 hours. BNP (last 3 results) No results for input(s): "PROBNP" in the last 8760 hours. HbA1C: No results for input(s): "HGBA1C" in the last 72 hours. CBG: Recent Labs  Lab 06/11/22 0907 06/11/22 1942 06/12/22 0858 06/12/22 2040 06/13/22 0836  GLUCAP 90 112* 94 113* 100*   Lipid Profile: Recent Labs    06/12/22 0247  TRIG 100   Thyroid Function Tests: No results for input(s): "TSH", "T4TOTAL", "FREET4", "T3FREE", "THYROIDAB" in the last 72 hours. Anemia Panel: No results for input(s): "VITAMINB12", "FOLATE", "FERRITIN", "TIBC", "IRON", "RETICCTPCT" in the last 72 hours. Sepsis Labs: Recent Labs   Lab 06/07/22 0423 06/08/22 0422  PROCALCITON <0.10 <0.10    No results found for this or any previous visit (from the past 240 hour(s)).       Radiology Studies: No results found.      Scheduled Meds:  busPIRone  5 mg Oral BID   Chlorhexidine Gluconate Cloth  6 each Topical Daily   vitamin B-12  1,000 mcg Oral Daily   enoxaparin (LOVENOX) injection  40 mg Subcutaneous Q24H   feeding supplement  237 mL Oral Q24H   finasteride  5 mg Oral Daily   insulin aspart  0-9 Units Subcutaneous 2 times per day   lidocaine  1 patch Transdermal Q24H   methocarbamol  500 mg Oral TID   pantoprazole  40 mg Oral Daily   psyllium  1 packet Oral Daily   sertraline  100 mg Oral Daily   simethicone  80 mg Oral TID WC & HS   tamsulosin  0.4 mg Oral QPC supper   Continuous Infusions:  sodium chloride     TPN CYCLIC-ADULT (ION)       LOS: 31 days   Time spent= 35 mins    Maleeyah Mccaughey Arsenio Loader, MD [facility]  If 7PM-7AM, please contact night-coverage  06/13/2022, 1:33 PM

## 2022-06-14 DIAGNOSIS — G9341 Metabolic encephalopathy: Secondary | ICD-10-CM | POA: Diagnosis not present

## 2022-06-14 LAB — COMPREHENSIVE METABOLIC PANEL
ALT: 41 U/L (ref 0–44)
AST: 38 U/L (ref 15–41)
Albumin: 2.7 g/dL — ABNORMAL LOW (ref 3.5–5.0)
Alkaline Phosphatase: 130 U/L — ABNORMAL HIGH (ref 38–126)
Anion gap: 5 (ref 5–15)
BUN: 30 mg/dL — ABNORMAL HIGH (ref 8–23)
CO2: 26 mmol/L (ref 22–32)
Calcium: 8.3 mg/dL — ABNORMAL LOW (ref 8.9–10.3)
Chloride: 110 mmol/L (ref 98–111)
Creatinine, Ser: 0.78 mg/dL (ref 0.61–1.24)
GFR, Estimated: >60 mL/min (ref >60)
Glucose, Bld: 118 mg/dL — ABNORMAL HIGH (ref 70–99)
Potassium: 3.7 mmol/L (ref 3.5–5.1)
Sodium: 141 mmol/L (ref 135–145)
Total Bilirubin: 0.4 mg/dL (ref 0.3–1.2)
Total Protein: 6.2 g/dL — ABNORMAL LOW (ref 6.5–8.1)

## 2022-06-14 LAB — GLUCOSE, CAPILLARY
Glucose-Capillary: 121 mg/dL — ABNORMAL HIGH (ref 70–99)
Glucose-Capillary: 145 mg/dL — ABNORMAL HIGH (ref 70–99)

## 2022-06-14 LAB — PHOSPHORUS: Phosphorus: 3.2 mg/dL (ref 2.5–4.6)

## 2022-06-14 LAB — MAGNESIUM: Magnesium: 2 mg/dL (ref 1.7–2.4)

## 2022-06-14 LAB — AMMONIA: Ammonia: 31 umol/L (ref 9–35)

## 2022-06-14 MED ORDER — ONDANSETRON HCL 4 MG/2ML IJ SOLN
4.0000 mg | Freq: Four times a day (QID) | INTRAMUSCULAR | Status: DC | PRN
  Administered 2022-06-18 – 2022-06-19 (×2): 4 mg via INTRAVENOUS
  Filled 2022-06-14 (×2): qty 2

## 2022-06-14 MED ORDER — PSYLLIUM 95 % PO PACK: 1.0000 | PACK | Freq: Every day | ORAL | Status: DC | PRN

## 2022-06-14 MED ORDER — SERTRALINE HCL 25 MG PO TABS
125.0000 mg | ORAL_TABLET | Freq: Every day | ORAL | Status: DC
  Administered 2022-06-14 – 2022-06-22 (×9): 125 mg via ORAL
  Filled 2022-06-14 (×9): qty 1

## 2022-06-14 MED ORDER — ONDANSETRON HCL 4 MG PO TABS
4.0000 mg | ORAL_TABLET | Freq: Four times a day (QID) | ORAL | Status: DC | PRN
  Administered 2022-06-14 – 2022-06-22 (×10): 4 mg via ORAL
  Filled 2022-06-14 (×10): qty 1

## 2022-06-14 MED ORDER — PANTOPRAZOLE SODIUM 40 MG PO TBEC
40.0000 mg | DELAYED_RELEASE_TABLET | Freq: Two times a day (BID) | ORAL | Status: DC
  Administered 2022-06-14 – 2022-06-23 (×18): 40 mg via ORAL
  Filled 2022-06-14 (×18): qty 1

## 2022-06-14 MED ORDER — GABAPENTIN 100 MG PO CAPS
100.0000 mg | ORAL_CAPSULE | Freq: Every day | ORAL | Status: DC
  Filled 2022-06-14: qty 1

## 2022-06-14 MED ORDER — TRAVASOL 10 % IV SOLN
INTRAVENOUS | Status: AC
  Filled 2022-06-14: qty 1380

## 2022-06-14 NOTE — Plan of Care (Signed)
  Problem: Health Behavior/Discharge Planning: Goal: Ability to manage health-related needs will improve Outcome: Progressing   Problem: Activity: Goal: Risk for activity intolerance will decrease Outcome: Progressing   Problem: Nutrition: Goal: Adequate nutrition will be maintained Outcome: Progressing

## 2022-06-14 NOTE — Progress Notes (Signed)
PHARMACY - TOTAL PARENTERAL NUTRITION CONSULT NOTE   Indication: Crohn's disease and complex past surgical history including ex lap with small bowel resection, lysis of adhesion, mesh explantation, and diverting loop ileostomy in April 2023, on TPN prior to admission  Patient Measurements: Height: _0  (177.8 cm) Weight: 69.6 kg (153 lb 7 oz) IBW/kg (Calculated) : 73 TPN AdjBW (KG): 68.1 Body mass index is 22.02 kg/m.  Assessment: Patient with PMH of Crohn's disease and complex surgical history as above on chronic TPN who presented on 05/13/22 with altered mental status after spouse found patient unresponsive with multiple pill bottles open around him. Patient was transported from SNF to ED the day prior for generalized weakness and acute worsening of his chronic abdominal pain. Patient decided to return home instead of his SNF. Pharmacy consulted to resume TPN as inpatient.   Discharge from [facility] 04/19/22:  TPN pharmacist at [facility]:  Estimated Nutrient Requirements: Protein: 1.5 - 1.7 g/kg/day (98 - 104 g/day), Total kcal: 30 - 35 kcal/kg/day (1830 - 1983 kcal/day)  TPN Formulation at [facility] per TPN pharmacist: [A 6.6%, D 20%, F 3.1%] @ goal rate, 65 mL/hr (TV: 1560 mL/day) provides: Protein 1.7 g/kg/day 103 g/day, Total kcal 32 kcal/kg/day 1971 kcal/day  NPC: N ratio 94   Admitted at [facility] 04/29/22-05/03/22: Nutritional Goals: TPN 80 mL/hr (provides 113 g of protein and 1943 kcals per day) RD Assessment: (10/16): 2100-2450kcal (30-35kcal/kg), Protein 105-130g (1.5-1.8g/kg) Utilize intralipid rather than SMOF as 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  [facility]: [name], PharmD, ph: [phone] 10/28 Most recent formula: Total volume 1560 mL:  Dextrose 220 g/day, Plenamine 120 g/day, SMOFlipid 50 g/day, NaCl 75 mEq, CaGluc 6 mEq, Potassium Ac 20 mEq, Potassium Phos 13.5 nM, Mag Sulfate 8 mEq, Vitamins/trace  elements   GI issues: Crohn's dz with chronic TPN, aspiration, dysphagia (followed by speech), severe PCM, ileostomy leakage  - PICC line placed in September 24 with the plan to continue TPN until the patient gains 30 to 40 pounds and then consider ileostomy closure.  Glucose / Insulin: No hx DM.  CBGs at goal <150. (CBGs BID), 2 unit SSI Electrolytes: Lytes wnl including CorrCa  Renal: SCr ok; BUN slightly elevated at 30; UOP ok Hepatic: LFTs trending down, Alk phos remains elevated , Tbili WNL - Albumin low 2.7 - TG 109 (11/23). 100 (11/27)  I/O:  - ileostomy output 775  mL up, UOP 850 mL - LBM 11/27 GI Imaging: - 10/14 CTa/p: previous surgical changes noted (transverse-sigmoid anastomosis) with stable inflammation since 10/12 CT - 10/27 CTa/p: no significant interval change since 10/14 CT. No evidence of bowel obstruction or other complicating feature at the anastomotic site or ostomy. - 11/6 abd [place] - normal liver (checked d/t elevated LFTs) - 11/20 CT: Same postoperative changes. Inflammatory change in the right lower quadrant is not significantly changed since 05/12/2022. New patchy opacity in the right lung base. GI Surgeries / Procedures: n/a  Central access: prior to admission - double lumen CVC (03/2022, 130lbs) TPN start date: started PTA  Nutritional Goals: TPN provides 138 g of protein and 2222  kcals per day.  RD Assessment this admission:    Estimated Needs Total Energy Estimated Needs: 2015-2350kcal Total Protein Estimated Needs: 100-135g Total Fluid Estimated Needs: 2469m (Holliday Segar)  Current Nutrition:  TPN Dysphagia-3 11/9 >> Regular 11/17 >>  Dysphagia 3 11/21 >>   Plan:  Continue Cyclic TPN  to 2300 ml over 14 hrs Will provide (incr) 138g AA, 25.91% lipids, 2222 kcal GIR 3-6.1 Electrolytes in TPN:  Na 75 mEq/L K 40 mEq/L Ca 5 mEq/L Mg 5 mEq/L Phos 15 mmol/L Cl:Ac = 1:1 Add standard MVI and trace elements to TPN  With excellent CBG control, will  limit finger sticks to twice daily.  Timed 2 hrs after TPN start, 1 hour after TPN stop  IVF dc'd by MD Monitor TPN labs on Mon/Thurs at minimum and PRN   Royetta Asal, PharmD, BCPS 06/14/2022 10:56 AM

## 2022-06-14 NOTE — Progress Notes (Addendum)
Nutrition Follow-up  DOCUMENTATION CODES:   Severe malnutrition in context of chronic illness  INTERVENTION:  - Continue current cyclic TPN, meeting estimated needs: Cyclic TPN with1 hour taper up/down for total x14 hour run time:  138g protein (1.9g/kg), 322g dextrose (GIR 2.87-5.74mg/kg/min), 576 lipids (26% calories) and 2223kcal (32kcal/kg).  - Continue DYS 3 per SLP recommendations. Low fiber food choices recommended. - Ensure Enlive po once daily, provides 350 kcal and 20 grams of protein. - Monitor weight trends closely. Recommend daily weights while on TPN.    NUTRITION DIAGNOSIS:   Severe Malnutrition related to chronic illness as evidenced by percent weight loss, severe fat depletion, severe muscle depletion. *ongoing  GOAL:   Patient will meet greater than or equal to 90% of their needs *being met with TPN  MONITOR:   Diet advancement, Labs, Other (Comment) (TPN)  REASON FOR ASSESSMENT:   Consult New TPN/TNA  ASSESSMENT:   Pt is a 71yo M with PMH of Crohn's disease resulting in a complicated surgical history followed at [facility] to include left hemicolectomy and ileostomy with multiple other complications to include TPN dependence, chronic prostatitis, SVT, and asthma who presents with generalized weakness, acute worsening of his chronic abdominal pain and AMS.  Patient reports no concerns with TPN, labs WNL and blood glucose well controlled.  Patient trying to eat something at all 3 meals daily. Meal intake 11/26-11/28 ranges from 10-50%, average of 28%. He is also drinking his Ensure daily to further support weight gain. Most recent weight on 11/27 shows possible weight gain over the past week but will need to continue to closely monitor.   Weight trends below: 10/28 148# 10/30 146# 11/10 150# 11/21 156# 11/24 149# 11/27 153#  Medications reviewed and include: Vitamin B12, Insulin  Labs reviewed:  - Blood Glucose 100-134 x24 hours    Diet Order:    Diet Order             DIET DYS 3 Room service appropriate? Yes; Fluid consistency: Nectar Thick  Diet effective now                   EDUCATION NEEDS:  Education needs have been addressed  Skin:  Skin Assessment: Reviewed RN Assessment Skin Integrity Issues:: Other (Comment) Other: incontinence associated dermatitis to mid sacrum  Last BM:  ileostomy  Height:  Ht Readings from Last 1 Encounters:  05/26/22 _0  (1.778 m)   Weight:  Wt Readings from Last 1 Encounters:  06/12/22 69.6 kg    BMI:  Body mass index is 22.02 kg/m.  Estimated Nutritional Needs:  Kcal:  2015-2350kcal Protein:  100-135g Fluid:  2462m (Holliday Segar)    ASamson FredericRD, LDN For contact information, refer to [facility]

## 2022-06-14 NOTE — Progress Notes (Addendum)
[facility] Progress Note Patient: [name] ZYS:[id number] DOB: [birth year] DOA: 05/13/2022  DOS: the patient was seen and examined on 06/14/2022  Brief hospital course: 71yo with hx Crohn's with persistent abd pain s/p extensive surgeries and is currently TPN dependent. Pt presented with unresponsiveness in apparent suicide attempt via overdose. Pt remained unresponsive and later was unable to maintain airway, prompting intubation and transfer to ICU. Pt now extubated and care transferred to hospitalist.  Medically stable.  Requiring inpatient psychiatric placement. Assessment and Plan: Intentional drug overdose. Suicide ideation. MDD. Severe anxiety disorder. Patient presented with an unresponsive event at home. Noted to have intentionally taken pain medication and anxiolytics with intention for suicide. Psychiatry was consulted. Recommended inpatient psychiatric admission. Patient currently medically stable although unable to find a place that can accommodate TPN. Referred to [facility].  Accepted.  Awaiting a bed. Alternative plan was to discharge patient at sister's place but currently wife not ready for this per psychiatry. Discussed with psychiatry, started on gabapentin starting 11/30.   Acute toxic and metabolic encephalopathy. Likely combination of medication overdose as well as hypoxia from pneumonia. Head CT and MRI brain negative for any acute abnormality. Mentation at baseline. Monitor.   Aspiration pneumonia. Dysphagia. Completed IV antibiotic therapy. CT on 11/20 shows evidence of new evidence of right lower lobe infiltrate. Possibility of an atelectasis cannot be ruled out but more most likely this is pneumonitis. Procalcitonin level is negative.    Dysphagia. Speech therapy following the patient. MBS was performed and patient was advanced to dysphagia 3 diet followed by advancing to regular diet. CT chest on 11/21 shows evidence of right lower lung  infiltrate. Speech therapy consulted again.  Repeat MBS on 11/22. Now back on D3 diet.  Nectar thick liquid.   Crohn's disease. Patient had multiple surgeries in the past.currently not on any medical therapy. 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 Management per pharmacy.   Severe protein calorie malnutrition. Body mass index is 21.54 kg/m.  Nutrition Problem: Severe Malnutrition Etiology: chronic illness Interventions: TPN (increase nutrients) Continue the same.   Chronic prostatitis. Bladder calculi. BPH. Seen by [name] on 10/15. Patient is on Bactrim for 28 days since then although missed some doses while he was intubated. Discussed with urology 11/15.  Completed total 14-day treatment course for Bactrim. Foley catheter placed in the ICU removed on 11/16.  Currently voiding successfully without any retention. Continue Flomax and finasteride. Patient will follow-up with [name] for outpatient procedure for BPH.   Ileostomy leakage with irritation and dermatitis.. Wound care consulted. Currently being treated with stoma powder and frequent emptying. Stoma appears healthy and pink.  No surrounding skin cellulitis seen.  Patient is following up with [name] in [facility].  Patient had a PICC line placed in September 24 with the plan to continue TPN until the patient gains 30 to 40 pounds and then consider ileostomy closure. Patient was 130 pound at the placement of the PICC line. Discussed with [name] on 11/21.  See note for 11/21 for details.   Chronic pelvic pain. Patient continues to report worsening of his pelvic pain. X-ray hip and pelvis is unremarkable for any acute etiology. Patient had a CT abdomen and pelvis as well as CT chest angio abdomen and pelvis earlier last month during this hospital stay which were unremarkable. Patient is requiring IV Dilaudid. Patient reported to the same to psychiatry that the pain is actually  causing him anxiety. Repeat CT abdomen pelvis  on 06/05/2022 is negative for any acute abnormality to explain patient's pelvic pain.  Does have some chronic inflammation which is unchanged. Patient was informed that he does not require any IV narcotics as there is no acute fracture or ruptured viscus or any other acute abnormality. Patient told me that discussion with regards to transitioning from narcotics to other kind of medicine was associated with his suicidal attempt. Will initiate gabapentin 100 mg nightly starting 11/30.   Transient A-fib. Resolved. Monitor.   Normocytic anemia.  Iron deficiency.  Relative vitamin B12 deficiency Baseline hemoglobin around 10. Currently hemoglobin around 8.  Iron level 18.  Suspect anemia is from frequent blood draws and poor p.o. intake and nutritional status. Vitamin B12 301.  Continue with replacement. We will recheck levels as it is almost a month since last values.  Intractable nausea. Etiology of his nausea is not clear as multiple CT scans have been unremarkable. Patient is requiring multiple doses of Zofran throughout the day. Related to anxiety cannot be ruled out. Will increase PPI to twice daily AC temporarily and monitor.   Subjective: No nausea no vomiting no fever no chills.  No chest pain no abdominal pain.  Physical Exam: General: in mild distress;  Cardiovascular: S1 and S2 Present, no Murmur Respiratory: normal respiratory effort, Bilateral Air entry present, no Crackles, no wheezes Abdomen: Bowel Sound present, Non tender  Extremities: no edema Neurology: alert and oriented to time, place, and person   Data Reviewed: I have Reviewed nursing notes, Vitals, and Lab results. Since last encounter, pertinent lab results BMP   . I have ordered test including CBC, BMP, iron level, N19, folic acid  .  Discussed with psychiatry.  Disposition: Status is: Inpatient Remains inpatient appropriate because: Unsafe discharge.  Need  inpatient psych.  Unable to go home with sister.  enoxaparin (LOVENOX) injection 40 mg Start: 05/13/22 2200   Family Communication: Sister at bedside. Level of care: Med-Surg  Vitals:   06/13/22 1409 06/13/22 1959 06/14/22 0641 06/14/22 1424  BP: 118/67 130/61 126/60 121/70  Pulse: 75 72 73 69  Resp: 18 16 16 17   Temp: 98.1 F (36.7 C) 97.7 F (36.5 C) (!) 97.3 F (36.3 C) 98 F (36.7 C)  TempSrc: Oral Oral Oral Oral  SpO2: 97% 95% 97% 97%  Weight:      Height:         Author: Berle Mull, MD 06/14/2022 6:24 PM  Please look on [email or web address] to find out who is on call.

## 2022-06-14 NOTE — Progress Notes (Addendum)
Physical Therapy Treatment Patient Details Name: [name] MRN: [id number] DOB: Jun 20, 1951 Today's Date: 06/14/2022   History of Present Illness 71 year old male with complicated past medical history as outlined below, which is significant for Crohn's disease with persistent abdominal pain.  On April of this year he underwent exploratory laparotomy with small bowel resection, repair of enterotomy, and creation of a diverting loop ileostomy at [facility].  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  He has been residing in [facility] SNF from where he presented to the emergency department on 10/27 for pain at the site of his ostomy with foul-smelling discharge.  Work-up of the ostomy and abdomen including CT imaging was unremarkable.  He was discharged to SNF, however, rather than returning to SNF he found private transport to his home.Then on 10/28 he again presented to [facility] emergency department with chief complaint of unresponsiveness.11/2 reveals that ingestion of pills at home was in fact a suicide attempt. 1:1 sitter, psych consult    PT Comments    Patient found ambulating from bathroom with no AD with sister present prior to session. C/o of 9/10 pain in abd. Required supervision for bed mobility with bed flat for safety; supervision needed for transfers for safety; Supervision needed to ambulate 180 ft with RW; min guard needed to ambulate 20 ft with straight cane. "I want to leave here walking without anything." refering to AD. Will attempt to ambulate with no AD during next session. Min assist needed for stairs for safety. Pt began with step to pattern and progressed to alternating pattern half way up the stairs. Pt felt "unsteady" when going down stairs. Rest break needed after stair training due to fatigue. Pt has 5 steps to get into main lvl of sister's house with one rail on L side. No AD device needed. Patient plans on staying at sister's house with support from sister.      Recommendations for follow up therapy are one component of a multi-disciplinary discharge planning process, led by the attending physician.  Recommendations may be updated based on patient status, additional functional criteria and insurance authorization.  Follow Up Recommendations  Home health PT Can patient physically be transported by private vehicle: Yes   Assistance Recommended at Discharge Intermittent Supervision/Assistance  Patient can return home with the following A little help with walking and/or transfers;A little help with bathing/dressing/bathroom;Assistance with cooking/housework;Assist for transportation;Help with stairs or ramp for entrance;Direct supervision/assist for medications management   Equipment Recommendations  Other (comment)    Recommendations for Other Services       Precautions / Restrictions Precautions Precautions: Fall Precaution Comments: ileostomy Restrictions Weight Bearing Restrictions: No     Mobility  Bed Mobility Overal bed mobility: Needs Assistance Bed Mobility: Sit to Supine      Sit to supine: Supervision   General bed mobility comments: Pt was found ambulating back from bathroom with no AD prior to session. supervision needed for bed mobility with bed flat.    Transfers Overall transfer level: Needs assistance Equipment used: Rolling walker (2 wheels) Transfers: Sit to/from Stand Sit to Stand: Supervision           General transfer comment: Supervision needed for transfers for safety.    Ambulation/Gait Ambulation/Gait assistance: Min guard, Supervision Gait Distance (Feet): 200 Feet Assistive device: Rolling walker (2 wheels), Straight cane Gait Pattern/deviations: Step-through pattern, Decreased stride length, Trunk flexed, Narrow base of support Gait velocity: decreased     General Gait Details: supervision needed to ambulate  180 ft with RW; min guard needed to ambulate 20 ft with straight cane. "I want to  leave here walking without anything." refering to AD.   Stairs Stairs: Yes Stairs assistance: Min assist Stair Management: One rail Left, Forwards, Alternating pattern, Step to pattern Number of Stairs: 10 General stair comments: Min assist needed for safety. Pt began with step to pattern and progressed to alternating pattern half way up the stairs. Pt felt "unsteady" when going down stairs. Rest break needed after stair training due to fatigue.   Wheelchair Mobility    Modified Rankin (Stroke Patients Only)       Balance                                            Cognition Arousal/Alertness: Awake/alert Behavior During Therapy: WFL for tasks assessed/performed Overall Cognitive Status: Within Functional Limits for tasks assessed Area of Impairment: Following commands, Safety/judgement                 Orientation Level: Disoriented to, Time Current Attention Level: Focused   Following Commands: Follows one step commands with increased time, Follows multi-step commands inconsistently Safety/Judgement: Decreased awareness of safety, Decreased awareness of deficits Awareness: Intellectual   General Comments: alert, following commands and responds appropriately        Exercises      General Comments        Pertinent Vitals/Pain Pain Assessment Pain Assessment: 0-10 Pain Score: 9  Pain Location: ABD Pain Descriptors / Indicators: Aching Pain Intervention(s): Limited activity within patient's tolerance, Monitored during session    Home Living                          Prior Function            PT Goals (current goals can now be found in the care plan section) Acute Rehab PT Goals Patient Stated Goal: to get better PT Goal Formulation: With patient Time For Goal Achievement: 06/28/22 Potential to Achieve Goals: Good Progress towards PT goals: Progressing toward goals    Frequency    Min 3X/week      PT Plan  Current plan remains appropriate;Discharge plan needs to be updated    Co-evaluation              AM-PAC PT "6 Clicks" Mobility   Outcome Measure  Help needed turning from your back to your side while in a flat bed without using bedrails?: None Help needed moving from lying on your back to sitting on the side of a flat bed without using bedrails?: A Little Help needed moving to and from a bed to a chair (including a wheelchair)?: A Little Help needed standing up from a chair using your arms (e.g., wheelchair or bedside chair)?: A Little Help needed to walk in hospital room?: A Little Help needed climbing 3-5 steps with a railing? : A Little 6 Click Score: 19    End of Session Equipment Utilized During Treatment: Gait belt Activity Tolerance: Patient tolerated treatment well Patient left: with call bell/phone within reach;in bed;with nursing/sitter in room Nurse Communication: Mobility status PT Visit Diagnosis: Difficulty in walking, not elsewhere classified (R26.2)     Time: 1526-     Charges:  $Gait Training: 8-22 mins  Allyson Sabal 06/14/2022, 3:59 PM

## 2022-06-15 DIAGNOSIS — G9341 Metabolic encephalopathy: Secondary | ICD-10-CM | POA: Diagnosis not present

## 2022-06-15 LAB — IRON AND TIBC
Iron: 24 ug/dL — ABNORMAL LOW (ref 45–182)
Saturation Ratios: 6 % — ABNORMAL LOW (ref 17.9–39.5)
TIBC: 376 ug/dL (ref 250–450)
UIBC: 352 ug/dL

## 2022-06-15 LAB — CBC WITH DIFFERENTIAL/PLATELET
Abs Immature Granulocytes: 0.03 10*3/uL (ref 0.00–0.07)
Basophils Absolute: 0 10*3/uL (ref 0.0–0.1)
Basophils Relative: 0 %
Eosinophils Absolute: 0.1 10*3/uL (ref 0.0–0.5)
Eosinophils Relative: 2 %
HCT: 31.3 % — ABNORMAL LOW (ref 39.0–52.0)
Hemoglobin: 9.2 g/dL — ABNORMAL LOW (ref 13.0–17.0)
Immature Granulocytes: 1 %
Lymphocytes Relative: 31 %
Lymphs Abs: 2 10*3/uL (ref 0.7–4.0)
MCH: 24.1 pg — ABNORMAL LOW (ref 26.0–34.0)
MCHC: 29.4 g/dL — ABNORMAL LOW (ref 30.0–36.0)
MCV: 81.9 fL (ref 80.0–100.0)
Monocytes Absolute: 0.7 10*3/uL (ref 0.1–1.0)
Monocytes Relative: 10 %
Neutro Abs: 3.6 10*3/uL (ref 1.7–7.7)
Neutrophils Relative %: 56 %
Platelets: 180 10*3/uL (ref 150–400)
RBC: 3.82 MIL/uL — ABNORMAL LOW (ref 4.22–5.81)
RDW: 19.5 % — ABNORMAL HIGH (ref 11.5–15.5)
WBC: 6.4 10*3/uL (ref 4.0–10.5)
nRBC: 0 % (ref 0.0–0.2)

## 2022-06-15 LAB — MAGNESIUM: Magnesium: 2.2 mg/dL (ref 1.7–2.4)

## 2022-06-15 LAB — GLUCOSE, CAPILLARY
Glucose-Capillary: 111 mg/dL — ABNORMAL HIGH (ref 70–99)
Glucose-Capillary: 118 mg/dL — ABNORMAL HIGH (ref 70–99)

## 2022-06-15 LAB — COMPREHENSIVE METABOLIC PANEL
ALT: 41 U/L (ref 0–44)
AST: 39 U/L (ref 15–41)
Albumin: 2.8 g/dL — ABNORMAL LOW (ref 3.5–5.0)
Alkaline Phosphatase: 130 U/L — ABNORMAL HIGH (ref 38–126)
Anion gap: 5 (ref 5–15)
BUN: 34 mg/dL — ABNORMAL HIGH (ref 8–23)
CO2: 25 mmol/L (ref 22–32)
Calcium: 8.6 mg/dL — ABNORMAL LOW (ref 8.9–10.3)
Chloride: 110 mmol/L (ref 98–111)
Creatinine, Ser: 0.82 mg/dL (ref 0.61–1.24)
GFR, Estimated: >60 mL/min (ref >60)
Glucose, Bld: 111 mg/dL — ABNORMAL HIGH (ref 70–99)
Potassium: 4 mmol/L (ref 3.5–5.1)
Sodium: 140 mmol/L (ref 135–145)
Total Bilirubin: 0.5 mg/dL (ref 0.3–1.2)
Total Protein: 6.5 g/dL (ref 6.5–8.1)

## 2022-06-15 LAB — FERRITIN: Ferritin: 34 ng/mL (ref 24–336)

## 2022-06-15 LAB — VITAMIN B12: Vitamin B-12: 677 pg/mL (ref 180–914)

## 2022-06-15 LAB — TRIGLYCERIDES: Triglycerides: 93 mg/dL (ref <150)

## 2022-06-15 LAB — PHOSPHORUS: Phosphorus: 2.9 mg/dL (ref 2.5–4.6)

## 2022-06-15 MED ORDER — GABAPENTIN 100 MG PO CAPS
100.0000 mg | ORAL_CAPSULE | Freq: Two times a day (BID) | ORAL | Status: DC
  Administered 2022-06-15 – 2022-06-23 (×17): 100 mg via ORAL
  Filled 2022-06-15 (×17): qty 1

## 2022-06-15 MED ORDER — HYOSCYAMINE SULFATE 0.125 MG SL SUBL
0.1250 mg | SUBLINGUAL_TABLET | Freq: Four times a day (QID) | SUBLINGUAL | Status: DC | PRN
  Administered 2022-06-19 – 2022-06-23 (×8): 0.125 mg via SUBLINGUAL
  Filled 2022-06-15 (×10): qty 1

## 2022-06-15 MED ORDER — TRAVASOL 10 % IV SOLN
INTRAVENOUS | Status: AC
  Filled 2022-06-15: qty 1380

## 2022-06-15 MED ORDER — HYOSCYAMINE SULFATE 0.125 MG PO TABS: 0.1250 mg | ORAL_TABLET | Freq: Four times a day (QID) | ORAL | Status: DC | PRN

## 2022-06-15 NOTE — Progress Notes (Signed)
PHARMACY - TOTAL PARENTERAL NUTRITION CONSULT NOTE   Indication: Crohn's disease and complex past surgical history including ex lap with small bowel resection, lysis of adhesion, mesh explantation, and diverting loop ileostomy in April 2023, on TPN prior to admission  Patient Measurements: Height: _0  (177.8 cm) Weight: 69.6 kg (153 lb 7 oz) IBW/kg (Calculated) : 73 TPN AdjBW (KG): 68.1 Body mass index is 22.02 kg/m.  Assessment: Patient with PMH of Crohn's disease and complex surgical history as above on chronic TPN who presented on 05/13/22 with altered mental status after spouse found patient unresponsive with multiple pill bottles open around him. Patient was transported from SNF to ED the day prior for generalized weakness and acute worsening of his chronic abdominal pain. Patient decided to return home instead of his SNF. Pharmacy consulted to resume TPN as inpatient.   Discharge from [facility] 04/19/22:  TPN pharmacist at [facility]:  Estimated Nutrient Requirements: Protein: 1.5 - 1.7 g/kg/day (98 - 104 g/day), Total kcal: 30 - 35 kcal/kg/day (1830 - 1983 kcal/day)  TPN Formulation at [facility] per TPN pharmacist: [A 6.6%, D 20%, F 3.1%] @ goal rate, 65 mL/hr (TV: 1560 mL/day) provides: Protein 1.7 g/kg/day 103 g/day, Total kcal 32 kcal/kg/day 1971 kcal/day  NPC: N ratio 94   Admitted at [facility] 04/29/22-05/03/22: Nutritional Goals: TPN 80 mL/hr (provides 113 g of protein and 1943 kcals per day) RD Assessment: (10/16): 2100-2450kcal (30-35kcal/kg), Protein 105-130g (1.5-1.8g/kg) Utilize intralipid rather than SMOF as 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  [facility]: [name], PharmD, ph: [phone] 10/28 Most recent formula: Total volume 1560 mL:  Dextrose 220 g/day, Plenamine 120 g/day, SMOFlipid 50 g/day, NaCl 75 mEq, CaGluc 6 mEq, Potassium Ac 20 mEq, Potassium Phos 13.5 nM, Mag Sulfate 8 mEq, Vitamins/trace  elements   GI issues: Crohn's dz with chronic TPN, aspiration, dysphagia (followed by speech), severe PCM, ileostomy leakage  - PICC line placed in September 24 with the plan to continue TPN until the patient gains 30 to 40 pounds and then consider ileostomy closure.  Glucose / Insulin: No hx DM.  CBGs at goal <150. (CBGs BID), 2 unit SSI Electrolytes: Lytes wnl including CorrCa  Renal: SCr ok; BUN slightly elevated at 34; UOP ok Hepatic: LFTs trending down, Alk phos remains elevated , Tbili WNL - Albumin low 2.7 - TG 109 (11/23). 100 (11/27)  I/O:  - ileostomy output 775  mL up, UOP likely not fully charted  - LBM 11/29 GI Imaging: - 10/14 CTa/p: previous surgical changes noted (transverse-sigmoid anastomosis) with stable inflammation since 10/12 CT - 10/27 CTa/p: no significant interval change since 10/14 CT. No evidence of bowel obstruction or other complicating feature at the anastomotic site or ostomy. - 11/6 abd [place] - normal liver (checked d/t elevated LFTs) - 11/20 CT: Same postoperative changes. Inflammatory change in the right lower quadrant is not significantly changed since 05/12/2022. New patchy opacity in the right lung base. GI Surgeries / Procedures: n/a  Central access: prior to admission - double lumen CVC (03/2022, 130lbs) TPN start date: started PTA  Nutritional Goals: TPN provides 138 g of protein and 2222  kcals per day.  RD Assessment this admission:    Estimated Needs Total Energy Estimated Needs: 2015-2350kcal Total Protein Estimated Needs: 100-135g Total Fluid Estimated Needs: 2423m (Holliday Segar)  Current Nutrition:  TPN Dysphagia-3 11/9 >> Regular 11/17 >>  Dysphagia 3 11/21 >>   Plan:  Continue Cyclic TPN to 3846 ml over 14 hrs Will provide (incr) 138g AA, 25.91% lipids, 2222 kcal GIR 3-6.1 Electrolytes in TPN:  Na 75 mEq/L K 40 mEq/L Ca 5 mEq/L Mg 5 mEq/L Phos 15 mmol/L Cl:Ac = 1:1 Add standard MVI and trace elements to TPN  With  excellent CBG control, will limit finger sticks to twice daily.  Timed 2 hrs after TPN start, 1 hour after TPN stop  IVF dc'd by MD Monitor TPN labs on Mon/Thurs at minimum and PRN As labs have been very stable, will start ordering labs q48h   Royetta Asal, PharmD, BCPS 06/15/2022 9:37 AM

## 2022-06-15 NOTE — Progress Notes (Signed)
[facility] Progress Note Patient: [name] FIE:[id number] DOB: 08-Mar-1951 DOA: 05/13/2022  DOS: the patient was seen and examined on 06/15/2022  Brief hospital course: 71yo with hx Crohn's with persistent abd pain s/p extensive surgeries and is currently TPN dependent. Pt presented with unresponsiveness in apparent suicide attempt via overdose. Pt remained unresponsive and later was unable to maintain airway, prompting intubation and transfer to ICU. Pt now extubated and care transferred to hospitalist.  Medically stable.  Requiring inpatient psychiatric placement. Assessment and Plan: Intentional drug overdose. Suicide ideation. MDD. Severe anxiety disorder. Patient presented with an unresponsive event at home. Noted to have intentionally taken pain medication and anxiolytics with intention for suicide. Psychiatry was consulted. Recommended inpatient psychiatric admission. Patient currently medically stable although unable to find a place that can accommodate TPN. Referred to [facility].  Accepted.  Awaiting a bed.  No other safe discharge plan that the patient and the family will agree on.   Acute toxic and metabolic encephalopathy. Likely combination of medication overdose as well as hypoxia from pneumonia. Head CT and MRI brain negative for any acute abnormality. Mentation at baseline. Monitor.   Aspiration pneumonia, brief episode of pneumonitis. Completed IV antibiotic therapy earlier for aspiration pneumonia. CT on 11/20 shows evidence of new evidence of right lower lobe infiltrate. Procalcitonin level is negative.    Dysphagia. Speech therapy following the patient. MBS was performed and patient was advanced to dysphagia 3 diet followed by advancing to regular diet. CT chest on 11/21 shows evidence of right lower lung infiltrate. Speech therapy consulted again.  Repeat MBS on 11/22. Now back on D3 diet.  Nectar thick liquid.   Crohn's disease. Patient had multiple  surgeries in the past.currently not on any medical therapy. 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 Management per pharmacy.   Severe protein calorie malnutrition. Body mass index is 21.54 kg/m.  Nutrition Problem: Severe Malnutrition Etiology: chronic illness Interventions: TPN (increase nutrients) Continue the same.   Chronic prostatitis. Bladder calculi. BPH. Seen by [name] on 10/15. Patient is on Bactrim for 28 days since then although missed some doses while he was intubated. Discussed with urology 11/15.  Completed total 14-day treatment course for Bactrim. Foley catheter placed in the ICU removed on 11/16.  Currently voiding successfully without any retention. Continue Flomax and finasteride. Patient will follow-up with [name] for outpatient procedure for BPH.   Ileostomy leakage with irritation and dermatitis.. Wound care consulted. Currently being treated with stoma powder and frequent emptying. Stoma appears healthy and pink.  No surrounding skin cellulitis seen.  Patient is [name]  Patient had a PICC line placed in September 24 with the plan to continue TPN until the patient gains 30 to 40 pounds and then consider ileostomy closure. Patient was 130 pound at the placement of the PICC line. Discussed with [name] on 11/21.  See note for 11/21 for details.   Chronic pelvic pain. Patient continues to report worsening of his pelvic pain. X-ray hip and pelvis is unremarkable for any acute etiology. Patient had a CT abdomen and pelvis as well as CT chest angio abdomen and pelvis earlier last month during this hospital stay which were unremarkable. Patient is requiring IV Dilaudid. Patient reported to the same to psychiatry that the pain is actually causing him anxiety. Repeat CT abdomen pelvis on 06/05/2022 is negative for any acute abnormality to explain patient's pelvic pain.  Does have some chronic  inflammation  which is unchanged. Patient was informed that he does not require any IV narcotics as there is no acute fracture or ruptured viscus or any other acute abnormality. Patient told me that discussion with regards to transitioning from narcotics to other kind of medicine was associated with his suicidal attempt. Start gabapentin twice daily.  Transient A-fib. Resolved. Monitor.   Normocytic anemia.  Iron deficiency.  Relative vitamin B12 deficiency Baseline hemoglobin around 10.  Hemoglobin stable after initial drop. Suspect anemia is from frequent blood draws and poor p.o. intake and nutritional status. Vitamin B12 301.  Continue with replacement. Repeat iron, B12 levels adequately trending up.  Intractable nausea. Etiology of his nausea is not clear as multiple CT scans have been unremarkable. Patient is requiring multiple doses of Zofran throughout the day. Related to anxiety cannot be ruled out. Continue PPI twice daily.  Add Bentyl.   Subjective: Reports anxiety.  Reports abdominal cramping. also reports nausea without any vomiting.  No fever no chills.  No change in the output from the ileostomy.  Physical Exam: General: in moderate distress;  Cardiovascular: S1 and S2 Present, no Murmur Respiratory: normal respiratory effort, Bilateral Air entry present, no Crackles, no wheezes Abdomen: Bowel Sound present, Non tender, chronic pelvic pain. Extremities: Non tender  Neurology: alert and oriented to time, place, and person   Data Reviewed: I have Reviewed nursing notes, Vitals, and Lab results. Since last encounter, pertinent lab results CBC and BMP   . I have ordered test including BMP  .   Disposition: Status is: Inpatient Remains inpatient appropriate because: Unsafe discharge.  Need inpatient psych.  enoxaparin (LOVENOX) injection 40 mg Start: 05/13/22 2200   Family Communication: Sister at bedside. Level of care: Med-Surg  Vitals:   06/13/22 1959 06/14/22  0641 06/14/22 1424 06/15/22 0934  BP: 130/61 126/60 121/70 119/65  Pulse: 72 73 69 71  Resp: 16 16 17 16   Temp: 97.7 F (36.5 C) (!) 97.3 F (36.3 C) 98 F (36.7 C) 97.7 F (36.5 C)  TempSrc: Oral Oral Oral Oral  SpO2: 95% 97% 97% 98%  Weight:      Height:         Author: Berle Mull, MD 06/15/2022 6:33 PM  Please look on [email or web address] to find out who is on call.

## 2022-06-15 NOTE — Progress Notes (Signed)
Speech Language Pathology Treatment: Dysphagia  Patient Details Name: [name] MRN: [id number] DOB: [birth year] Today's Date: 06/15/2022 Time: 0051-1021 SLP Time Calculation (min) (ACUTE ONLY): 24 min  Assessment / Plan / Recommendation Clinical Impression  Pt seen for skilled SLP to address dysphagia, po tolerance, initiate RMST to improve swallow function- TBR given vallecular retention on MBS approx 8 days ago.  Pt sitting upright on edge of bed eating his lunch!!!  He ambulated with SLP down the hall and back - which is a great improvement compared to prior visits where he required assist to slide up in bed and did not eat sitting upright.  Today he passed the 3 ounce [facility] water screen - does endorse occasional cough with liquids but states he is not coughing with use of thickener.  Observed pt consuming [place], orange juice - nectar thick and water - several boluses each.  No indications of aspiration or dysphagia. [name] did not sense pharyngeal retention on two prior instrumental swallow evaluations however and retention was mod to severe with solids.  Reviewed normal MBS and [name]'s MBS for comparison to aid in his understanding of reasoning for diet/compensations.   At this time, given pt's much improved mobility - suspect he is now tolerating potential episodic low grade aspiration.  Recommend allowing thin water anytime without food to assure he is staying hydrated with aspiration risk mitigation.    Will follow closely due to dysphagia and will initiate RMST to improve airway clearance and tongue base retraction for pharyngeal clearance. Cued cough not productive but subjectively stronger than during prior sessions.    Did not initiate RMST today due to pt eating and concern for aspiration of potential pharyngeal retention with EMST.  Pt making excellent progress, anticipate dietary advancement to be appropriate if MD agrees.     HPI HPI: Pt is a 71 yo male admit 10/28 after being  obtunded found down 10/30 - ? polypharmacy,  tx to ICU for intubation, 10/31 waking up and following commands overnight.  He was extubated 11/1 and failed swallow screen thus SLP eval was ordered.  He also has h/o Crohns disease - complex hx including small bowel perf -surgical history, short gut- TPN.  MRI showed No acute intracranial abnormality.  2. 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  Chest imaging 11/21 concerning for right infrahilar airspace consolidation concerning  for pneumonia or aspiration.  Repeat MBS indicated due to new findings on chest imaging.      SLP Plan  Continue with current plan of care      Recommendations for follow up therapy are one component of a multi-disciplinary discharge planning process, led by the attending physician.  Recommendations may be updated based on patient status, additional functional criteria and insurance authorization.    Recommendations  Diet recommendations: Dysphagia 3 (mechanical soft);Thin liquid;Nectar-thick liquid Liquids provided via: Cup Medication Administration: Whole meds with puree Supervision: Patient able to self feed Compensations: Slow rate;Small sips/bites;Other (Comment) Postural Changes and/or Swallow Maneuvers: Seated upright 90 degrees;Upright 30-60 min after meal                Oral Care Recommendations:  (thin water ok anytime when not consuming foods) Follow Up Recommendations: Follow physician's recommendations for discharge plan and follow up therapies Assistance recommended at discharge: Frequent or constant Supervision/Assistance SLP Visit Diagnosis: Dysphagia, oropharyngeal phase (R13.12) Plan: Continue with current plan of care  Kathleen Lime, MS [facility] SLP Acute Rehab Services Office [phone] Pager [phone]   Macario Golds  06/15/2022, 3:49 PM

## 2022-06-15 NOTE — Progress Notes (Signed)
Mobility Specialist - Progress Note   06/15/22 1551  Mobility  Activity Ambulated independently in hallway  Level of Assistance Independent after set-up  Assistive Device None  Distance Ambulated (ft) 200 ft  Activity Response Tolerated well  Mobility Referral Yes  $Mobility charge 1 Mobility   Pt received in bed and agreeable to mobility. Pt a little wobbly during session but was able to correct his balance without any help. No complaints during mobility. Pt to bed after session with all needs met & NT in room.     [facility]

## 2022-06-15 NOTE — TOC Progression Note (Signed)
Transition of Care [facility]) - Progression Note    Patient Details  Name: [name] MRN: [id number] Date of Birth: February 16, 1951  Transition of Care [facility]) CM/SW Contact  Lennart Pall, LCSW Phone Number: 06/15/2022, 2:57 PM  Clinical Narrative:    TOC continues to pursue inpatient psychiatric bed.  Have confirmed he remains on waitlist for [facility].  Have re-faxed information to [facility] and [facility] as well.  Continue to follow.   Expected Discharge Plan:  (TBD) Barriers to Discharge: Continued Medical Work up  Expected Discharge Plan and Services Expected Discharge Plan:  (TBD)   Discharge Planning Services: CM Consult Post Acute Care Choice: [place] Living arrangements for the past 2 months: Single Family Home                                       Social Determinants of Health (SDOH) Interventions    Readmission Risk Interventions    05/15/2022   10:53 AM  Readmission Risk Prevention Plan  Transportation Screening Complete  Medication Review (RN Care Manager) Complete  PCP or Specialist appointment within 3-5 days of discharge Complete  HRI or Home Care Consult Complete  SW Recovery Care/Counseling Consult Complete  [place] Complete

## 2022-06-15 NOTE — Progress Notes (Signed)
Mobility Specialist - Progress Note   06/15/22 1110  Mobility  Activity Ambulated independently in hallway  Level of Assistance Modified independent, requires aide device or extra time  Assistive Device None  Distance Ambulated (ft) 240 ft  Activity Response Tolerated well  Mobility Referral Yes  $Mobility charge 1 Mobility   Pt received in bed and agreeable to mobility. Pt did hold on to railing several time throughout  ambulation. No complaints during mobility. Pt to bed after session with all needs met & sitter in room.    [facility]

## 2022-06-16 DIAGNOSIS — G9341 Metabolic encephalopathy: Secondary | ICD-10-CM | POA: Diagnosis not present

## 2022-06-16 DIAGNOSIS — R401 Stupor: Secondary | ICD-10-CM | POA: Diagnosis not present

## 2022-06-16 LAB — COMPREHENSIVE METABOLIC PANEL
ALT: 38 U/L (ref 0–44)
AST: 38 U/L (ref 15–41)
Albumin: 2.7 g/dL — ABNORMAL LOW (ref 3.5–5.0)
Alkaline Phosphatase: 112 U/L (ref 38–126)
Anion gap: 5 (ref 5–15)
BUN: 33 mg/dL — ABNORMAL HIGH (ref 8–23)
CO2: 26 mmol/L (ref 22–32)
Calcium: 8.3 mg/dL — ABNORMAL LOW (ref 8.9–10.3)
Chloride: 109 mmol/L (ref 98–111)
Creatinine, Ser: 0.83 mg/dL (ref 0.61–1.24)
GFR, Estimated: >60 mL/min (ref >60)
Glucose, Bld: 112 mg/dL — ABNORMAL HIGH (ref 70–99)
Potassium: 4.2 mmol/L (ref 3.5–5.1)
Sodium: 140 mmol/L (ref 135–145)
Total Bilirubin: 0.5 mg/dL (ref 0.3–1.2)
Total Protein: 6.2 g/dL — ABNORMAL LOW (ref 6.5–8.1)

## 2022-06-16 LAB — GLUCOSE, CAPILLARY
Glucose-Capillary: 104 mg/dL — ABNORMAL HIGH (ref 70–99)
Glucose-Capillary: 105 mg/dL — ABNORMAL HIGH (ref 70–99)

## 2022-06-16 MED ORDER — TRAVASOL 10 % IV SOLN
INTRAVENOUS | Status: AC
  Filled 2022-06-16: qty 1380

## 2022-06-16 NOTE — Progress Notes (Signed)
[facility] Progress Note Patient: [name] DGL:[id number] DOB: [birth year] DOA: 05/13/2022  DOS: the patient was seen and examined on 06/16/2022  Brief hospital course: 71yo with hx Crohn's with persistent abd pain s/p extensive surgeries and is currently TPN dependent. Pt presented with unresponsiveness in apparent suicide attempt via overdose. Pt remained unresponsive and later was unable to maintain airway, prompting intubation and transfer to ICU. Pt now extubated and care transferred to hospitalist.  Medically stable.  Requiring inpatient psychiatric placement. Assessment and Plan: Intentional drug overdose. Suicide ideation. MDD. Severe anxiety disorder. Patient presented with an unresponsive event at home. Noted to have intentionally taken pain medication and anxiolytics with intention for suicide. Psychiatry was consulted. Recommended inpatient psychiatric admission. Patient currently medically stable although unable to find a place that can accommodate TPN. Referred to [facility] [place].  Accepted.  Awaiting a bed.  Patient reports good response to gabapentin.  Continue current dose.   Acute toxic and metabolic encephalopathy. Likely combination of medication overdose as well as hypoxia from pneumonia. Head CT and MRI brain negative for any acute abnormality. Mentation at baseline. Monitor.   Aspiration pneumonia, brief episode of pneumonitis. Completed IV antibiotic therapy earlier for aspiration pneumonia. CT on 11/20 shows evidence of new evidence of right lower lobe infiltrate. Procalcitonin level is negative.    Dysphagia. Speech therapy following the patient. MBS was performed and patient was advanced to dysphagia 3 diet followed by advancing to regular diet. CT chest on 11/21 shows evidence of right lower lung infiltrate. Speech therapy consulted again.  Repeat MBS on 11/22. Now back on D3 diet.  Nectar thick liquid.   Crohn's disease. Patient had multiple surgeries  in the past.currently not on any medical therapy. 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 Management per pharmacy.   Severe protein calorie malnutrition. Body mass index is 21.54 kg/m.  Nutrition Problem: Severe Malnutrition Etiology: chronic illness Interventions: TPN (increase nutrients) Continue the same.   Chronic prostatitis. Bladder calculi. BPH. Seen by [name] on 10/15. Patient is on Bactrim for 28 days since then although missed some doses while he was intubated. Discussed with urology 11/15.  Completed total 14-day treatment course for Bactrim. Foley catheter placed in the ICU removed on 11/16.  Currently voiding successfully without any retention. Continue Flomax and finasteride. Patient will follow-up with [name] for outpatient procedure for BPH.   Ileostomy leakage with irritation and dermatitis.. Wound care consulted. Currently being treated with stoma powder and frequent emptying. Stoma appears healthy and pink.  No surrounding skin cellulitis seen.  Patient is [name].  Patient had a PICC line placed in September 24 with the plan to continue TPN until the patient gains 30 to 40 pounds and then consider ileostomy closure. Patient was 130 pound at the placement of the PICC line. Discussed with [name] on 11/21.  See note for 11/21 for details.   Chronic pelvic pain. Patient continues to report worsening of his pelvic pain. X-ray hip and pelvis is unremarkable for any acute etiology. Patient had a CT abdomen and pelvis as well as CT chest angio abdomen and pelvis earlier last month during this hospital stay which were unremarkable. Patient is requiring IV Dilaudid. Patient reported to the same to psychiatry that the pain is actually causing him anxiety. Repeat CT abdomen pelvis on 06/05/2022 is negative for any acute abnormality to explain patient's pelvic pain.  Does have some chronic  inflammation which is unchanged. Patient  was informed that he does not require any IV narcotics as there is no acute fracture or ruptured viscus or any other acute abnormality. Patient told me that discussion with regards to transitioning from narcotics to other kind of medicine was associated with his suicidal attempt. Start gabapentin twice daily.  Transient A-fib. Resolved. Monitor.   Normocytic anemia.  Iron deficiency.  Relative vitamin B12 deficiency Baseline hemoglobin around 10.  Hemoglobin stable after initial drop. Suspect anemia is from frequent blood draws and poor p.o. intake and nutritional status. Vitamin B12 301.  Continue with replacement. Repeat iron, B12 levels adequately trending up.  Intractable nausea. Etiology of his nausea is not clear as multiple CT scans have been unremarkable. Patient is requiring multiple doses of Zofran throughout the day. Related to anxiety cannot be ruled out. Continue PPI twice daily.  Add Bentyl.   Subjective: Anxiety improving.  Pain improving.  Cramping resolved.  Nausea improving.  Able to sleep better last night.  Physical Exam: General: Appear in mild distress; Cardiovascular: S1 and S2 Present, no Murmur, Respiratory: good respiratory effort, Bilateral Air entry present, CTA, no Crackles, no wheezes Abdomen: Bowel Sound present, Non tender  Extremities: no Pedal edema Neurology: alert and oriented to time, place, and person   Data Reviewed: I have Reviewed nursing notes, Vitals, and Lab results. Since last encounter, pertinent lab results CMP   .    Disposition: Status is: Inpatient Remains inpatient appropriate because: Unsafe discharge.  Need inpatient psych.  enoxaparin (LOVENOX) injection 40 mg Start: 05/13/22 2200   Family Communication: Sister at bedside. Level of care: Med-Surg  Vitals:   06/15/22 0934 06/15/22 1956 06/16/22 0613 06/16/22 1400  BP: 119/65 126/65 (!) 114/55 116/64  Pulse: 71 63 77 68  Resp:  16 18 16 16   Temp: 97.7 F (36.5 C) 97.6 F (36.4 C) 98.2 F (36.8 C) 97.9 F (36.6 C)  TempSrc: Oral Oral Oral Oral  SpO2: 98% 98% 97% 96%  Weight:      Height:         Author: Berle Mull, MD 06/16/2022 6:03 PM  Please look on [email or web address] to find out who is on call.

## 2022-06-16 NOTE — Consult Note (Signed)
Face-to-Face Psychiatry Consult   Reason for Consult:  Suicide Attempt  Referring Physician:  Red Christians   Patient Identification: [name] MRN:  [id number] Principal Diagnosis: Metabolic encephalopathy Diagnosis:  Principal Problem:   Metabolic encephalopathy Active Problems:   Chronic anemia   Acute respiratory failure with hypoxia (HCC)   Severe malnutrition (HCC)   Generalized weakness   Drug overdose, intentional, initial encounter ([place])   Chronic pain syndrome   Aspiration pneumonia ([place])   Total Time spent with patient: 20 minutes  Subjective:   71yo with hx Crohn's with persistent abd pain s/p extensive surgeries and is currently TPN dependent. Pt presented with unresponsiveness in apparent suicide attempt via overdose.    During evaluation Marcelino Duster is observed to be sitting up right in bed. Patient mood brightens upon approach and he immediately sits up to engage. Patient is very interactive today and appears to be forthcoming with information at this time. Patient states he is ready to get home, and that his family is now all involved. He is alert and oriented, euthymic mood congruent with affect.  He tells me he has decided to go to his sisters home, and he is completely competent and capable to making his own decisions.  He continues to take Zoloft 125 mg, with no obvious side effects or adverse reactions.  He denies any sleeping disturbances, and appears to be eating fairly at this time.  He reports he has gained enough weight to have his surgery and he is looking forward to going home with his sister, and preparing for his ileostomy takedown. He apologizes for any miscommunication that may have taken place and states this provider can contact wife as second point of contact. He continues to deny suicidality at this time.   Patient is speaking in a clear tone at moderate volume, and normal pace; with good eye contact.  His thought process is coherent and relevant;  There is no indication that he is currently responding to internal/external stimuli or experiencing delusional thought content.  Patient denies suicidal/self-harm/homicidal ideation, psychosis, and paranoia.  Patient has remained calm throughout assessment and has answered questions appropriately.  He denies any active suicidal thoughts at this time.   Sister was present today. ALl questions and concerns were answered. Provider did inform will contact superiors and receive consent to move forward with disposition at this time. Provider met with Lorre Nick in person, and discussed the above, will proceed with safety planning once all parties have agreed. Psychiatry will continue to monitor.   Safety Assessment: Individualized risk factors include: age, caucasian , male gender and  previous suicide attempt, ongoing depression and anxiety, serious illness such as chronic pain, sense of hopelessness.. Individualized protective factors include: Patient has treatable psychiatric disorders and symptoms, No access to firearms or other means of harm, Future oriented, Reality-based and No history of head injury. Taking the aforementioned non-modifiable and modifiable risk factors in conjunction with his protective factors, the patient is currently felt to be of moderate to high imminent risk of harm to self. To further mitigate risk, please see the below treatment recommendations.    HPI: Currently on interview, the patient. Alert and oriented x4. Patient reports presenting to the emergency department after a suicide attempt via after taking multiple pills in an attempt to end his life. Over the past couple months he reports worsening depression and anxiety. He reports symptoms of depression including hopelessness, guilt/worthlessness, disturbed sleep pattern (sleeping 3 to 4 hours during the day)  loss of interest, low energy, decreased appetite, isolative, crying spells deterioration of ADLs and recurrent thoughts of death.   He reports that his symptoms have increased due to multiple stomach surgeries, deconditioning, and increase in pain. Patient is on TPN to improve nutrition, assist with weight gain. He is also on a dysphagia 3 diet at this time, with no assistance required to feed himself. Patient has met his medically goals and now medically stable for transfer to inpatient psychiatric unit.   Past Psychiatric History: Depression  Risk to Self:   yes suicide attempt Risk to Others:   Denies Prior Inpatient Therapy:   Denies Prior Outpatient Therapy:   Denies  Past Medical History:  Past Medical History:  Diagnosis Date   Acute prostatitis 07/24/2007   Qualifier: Diagnosis of  By: [name] MD, [name]    Allergy    mild   Arthritis    osteoarthritis   Asthma    Blood transfusion without reported diagnosis    BPH (benign prostatic hypertrophy) with urinary obstruction    Crohn's ileitis ([place]) suspected 05/03/2017   Dilated aortic root ([place])    noted on echo 08/2012   Diverticulitis of colon    EPIDIDYMITIS 02/15/2010   Qualifier: Diagnosis of  By: [name] MD, [name]    GERD (gastroesophageal reflux disease)    H/O: GI bleed    Hemorrhoids    Hepatitis 1975   unknown type    HERPES SIMPLEX INFECTION 10/14/2007   Qualifier: Diagnosis of  By: [name] MD, [name]    Hiatal hernia    Ileus following gastrointestinal surgery ([place]) 12/26/2011   Long term (current) use of systemic steroids 06/18/2018   Psoriasis    sees [name] at [facility].   Recurrent ventral incisional hernia 05/10/2012   SVT (supraventricular tachycardia)    Ulcer 08/21/2013   ileal    Past Surgical History:  Procedure Laterality Date   BOWEL RESECTION  12/19/2011   Procedure: SMALL BOWEL RESECTION;  Surgeon: [name], MD;  Location: [facility];  Service: General;  Laterality: N/A;  with anastamosis and insertion mesh   BRONCHOSCOPY     COLON SURGERY  01/2004   x 2   COLONOSCOPY W/ BIOPSIES  04/26/2017   per Dr.  Carlean Purl, no polyps, benign inflammation, repeat in 5 yrs    CYSTOSCOPY     ESOPHAGOGASTRODUODENOSCOPY     HEMICOLECTOMY     left side, at [name], diverticulitis   [place]     [phone] incisional hernia   ILEOSTOMY     ILEOSTOMY CLOSURE     INSERTION OF MESH  07/31/2012   Procedure: INSERTION OF MESH;  Surgeon: [name], MD;  Location: [facility];  Service: General;;   LAPAROTOMY  12/19/2011   Procedure: EXPLORATORY LAPAROTOMY;  Surgeon: [name], MD;  Location: [facility];  Service: General;  Laterality: N/A;   PACEMAKER IMPLANT N/A 12/01/2020   Procedure: PACEMAKER IMPLANT;  Surgeon: [name], MD;  Location: [facility];  Service: Cardiovascular;  Laterality: N/A;   PACEMAKER INSERTION Left    TONSILLECTOMY     UPPER GASTROINTESTINAL ENDOSCOPY     VASECTOMY     VENTRAL HERNIA REPAIR  07/31/2012   Procedure: HERNIA REPAIR VENTRAL ADULT;  Surgeon: [name], MD;  Location: [facility];  Service: General;  Laterality: N/A;   Family History:  Family History  Problem Relation Age of Onset  Lung cancer Mother    Leukemia Father    Hypertension Father    Heart disease Father    Heart attack Father    Prostate cancer Father    Prostate cancer Paternal Uncle    Colon cancer Neg Hx    Stomach cancer Neg Hx    Colon polyps Neg Hx    Esophageal cancer Neg Hx    Rectal cancer Neg Hx    Family Psychiatric  History: none reported  Social History:  Social History   Substance and Sexual Activity  Alcohol Use Not Currently   Comment: occ     Social History   Substance and Sexual Activity  Drug Use Yes   Types: Oxycodone    Social History   Socioeconomic History   Marital status: Married    Spouse name: Not on file   Number of children: 2   Years of education: Not on file   Highest education level: Not on file  Occupational History   Occupation: EHS Freight forwarder    Employer: [employer] education [employer]  Tobacco Use    Smoking status: Never   Smokeless tobacco: Never  Vaping Use   Vaping Use: Never used  Substance and Sexual Activity   Alcohol use: Not Currently    Comment: occ   Drug use: Yes    Types: Oxycodone   Sexual activity: Not on file  Other Topics Concern   Not on file  Social History Narrative   He is married with 2 sons 1 son is an Art gallery manager and 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   He is Nurse, 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   Rare if any caffeine   Rare alcohol and never smoker   Social Determinants of Radio broadcast assistant Strain: Low Risk  (04/10/2022)   Overall Financial Resource Strain (CARDIA)    Difficulty of Paying Living Expenses: Not hard at all  Food Insecurity: No Food Insecurity (05/14/2022)   Hunger Vital Sign    Worried About Running Out of Food in the Last Year: Never true    [place] in the Last Year: Never true  Transportation Needs: No Transportation Needs (05/14/2022)   PRAPARE - Hydrologist (Medical): No    Lack of Transportation (Non-Medical): No  Physical Activity: Inactive (04/10/2022)   Exercise Vital Sign    Days of Exercise per Week: 0 days    Minutes of Exercise per Session: 0 min  Stress: Stress Concern Present (04/10/2022)   [place]    Feeling of Stress : To some extent  Social Connections: Not on file   Additional Social History:    Allergies:   Allergies  Allergen Reactions   Purinethol [Mercaptopurine] Other (See Comments)    Pancreatitis    Shellfish Allergy Anaphylaxis and Swelling    Can use standard SMOF lipid formulation for TPN without any issue.    Humira [Adalimumab] Other (See Comments)    Developed antibodies   Tape Rash   Wound Dressing Adhesive Rash    Labs:  Results for orders placed or  performed during the hospital encounter of 05/13/22 (from the past 48 hour(s))  Glucose, capillary     Status: Abnormal   Collection Time: 06/14/22  7:50 PM  Result Value Ref Range   Glucose-Capillary 145 (H)  70 - 99 mg/dL    Comment: Glucose reference range applies only to samples taken after fasting for at least 8 hours.  Magnesium     Status: None   Collection Time: 06/15/22  4:25 AM  Result Value Ref Range   Magnesium 2.2 1.7 - 2.4 mg/dL    Comment: Performed at [facility], [place] [address] [place], [place] [address]  Phosphorus     Status: None   Collection Time: 06/15/22  4:25 AM  Result Value Ref Range   Phosphorus 2.9 2.5 - 4.6 mg/dL    Comment: Performed at [facility], [place] [address], [place], [place] [address]  Triglycerides     Status: None   Collection Time: 06/15/22  4:25 AM  Result Value Ref Range   Triglycerides 93 <150 mg/dL    Comment: Performed at [facility], [place] [address], [place], [place] [address]  Comprehensive metabolic panel     Status: Abnormal   Collection Time: 06/15/22  4:25 AM  Result Value Ref Range   Sodium 140 135 - 145 mmol/L   Potassium 4.0 3.5 - 5.1 mmol/L   Chloride 110 98 - 111 mmol/L   CO2 25 22 - 32 mmol/L   Glucose, Bld 111 (H) 70 - 99 mg/dL    Comment: Glucose reference range applies only to samples taken after fasting for at least 8 hours.   BUN 34 (H) 8 - 23 mg/dL   Creatinine, Ser 0.82 0.61 - 1.24 mg/dL   Calcium 8.6 (L) 8.9 - 10.3 mg/dL   Total Protein 6.5 6.5 - 8.1 g/dL   Albumin 2.8 (L) 3.5 - 5.0 g/dL   AST 39 15 - 41 U/L   ALT 41 0 - 44 U/L   Alkaline Phosphatase 130 (H) 38 - 126 U/L   Total Bilirubin 0.5 0.3 - 1.2 mg/dL   GFR, Estimated >60 >60 mL/min    Comment: (NOTE) Calculated using the CKD-EPI Creatinine Equation (2021)    Anion gap 5 5 - 15    Comment: Performed at [facility], [place] [address] [place], [place] [address]  CBC with  Differential/Platelet     Status: Abnormal   Collection Time: 06/15/22  4:25 AM  Result Value Ref Range   WBC 6.4 4.0 - 10.5 K/uL   RBC 3.82 (L) 4.22 - 5.81 MIL/uL   Hemoglobin 9.2 (L) 13.0 - 17.0 g/dL   HCT 31.3 (L) 39.0 - 52.0 %   MCV 81.9 80.0 - 100.0 fL   MCH 24.1 (L) 26.0 - 34.0 pg   MCHC 29.4 (L) 30.0 - 36.0 g/dL   RDW 19.5 (H) 11.5 - 15.5 %   Platelets 180 150 - 400 K/uL   nRBC 0.0 0.0 - 0.2 %   Neutrophils Relative % 56 %   Neutro Abs 3.6 1.7 - 7.7 K/uL   Lymphocytes Relative 31 %   Lymphs Abs 2.0 0.7 - 4.0 K/uL   Monocytes Relative 10 %   Monocytes Absolute 0.7 0.1 - 1.0 K/uL   Eosinophils Relative 2 %   Eosinophils Absolute 0.1 0.0 - 0.5 K/uL   Basophils Relative 0 %   Basophils Absolute 0.0 0.0 - 0.1 K/uL   Immature Granulocytes 1 %   Abs Immature Granulocytes 0.03 0.00 - 0.07 K/uL    Comment: Performed at [facility], [place] [address] [place], [place] [address]  Iron and TIBC     Status: Abnormal   Collection  Time: 06/15/22  4:25 AM  Result Value Ref Range   Iron 24 (L) 45 - 182 ug/dL   TIBC 376 250 - 450 ug/dL   Saturation Ratios 6 (L) 17.9 - 39.5 %   UIBC 352 ug/dL    Comment: Performed at [facility], [place] [address] [place], [place] [address]  Ferritin     Status: None   Collection Time: 06/15/22  4:25 AM  Result Value Ref Range   Ferritin 34 24 - 336 ng/mL    Comment: Performed at [facility], [place] [address], [place], [place] [address]  Vitamin B12     Status: None   Collection Time: 06/15/22  4:25 AM  Result Value Ref Range   Vitamin B-12 677 180 - 914 pg/mL    Comment: (NOTE) This assay is not validated for testing neonatal or myeloproliferative syndrome specimens for Vitamin B12 levels. Performed at [facility], [place] [address] [place], [place] [address]   Glucose, capillary     Status: Abnormal   Collection Time: 06/15/22  8:09 AM  Result Value Ref Range    Glucose-Capillary 111 (H) 70 - 99 mg/dL    Comment: Glucose reference range applies only to samples taken after fasting for at least 8 hours.  Glucose, capillary     Status: Abnormal   Collection Time: 06/15/22  7:50 PM  Result Value Ref Range   Glucose-Capillary 118 (H) 70 - 99 mg/dL    Comment: Glucose reference range applies only to samples taken after fasting for at least 8 hours.  Comprehensive metabolic panel     Status: Abnormal   Collection Time: 06/16/22  6:10 AM  Result Value Ref Range   Sodium 140 135 - 145 mmol/L   Potassium 4.2 3.5 - 5.1 mmol/L   Chloride 109 98 - 111 mmol/L   CO2 26 22 - 32 mmol/L   Glucose, Bld 112 (H) 70 - 99 mg/dL    Comment: Glucose reference range applies only to samples taken after fasting for at least 8 hours.   BUN 33 (H) 8 - 23 mg/dL   Creatinine, Ser 0.83 0.61 - 1.24 mg/dL   Calcium 8.3 (L) 8.9 - 10.3 mg/dL   Total Protein 6.2 (L) 6.5 - 8.1 g/dL   Albumin 2.7 (L) 3.5 - 5.0 g/dL   AST 38 15 - 41 U/L   ALT 38 0 - 44 U/L   Alkaline Phosphatase 112 38 - 126 U/L   Total Bilirubin 0.5 0.3 - 1.2 mg/dL   GFR, Estimated >60 >60 mL/min    Comment: (NOTE) Calculated using the CKD-EPI Creatinine Equation (2021)    Anion gap 5 5 - 15    Comment: Performed at [facility], [place] [address] [place], [place] [address]  Glucose, capillary     Status: Abnormal   Collection Time: 06/16/22  8:00 AM  Result Value Ref Range   Glucose-Capillary 104 (H) 70 - 99 mg/dL    Comment: Glucose reference range applies only to samples taken after fasting for at least 8 hours.    Current Facility-Administered Medications  Medication Dose Route Frequency Provider Last Rate Last Admin   0.9 %  sodium chloride infusion   Intravenous PRN Lavina Hamman, MD       acetaminophen (TYLENOL) tablet 650 mg  650 mg Oral Q6H PRN Arrien, Jimmy Picket, MD   650 mg at 06/14/22 2119   benzonatate (TESSALON) capsule 100 mg  100  mg Oral TID PRN Eulogio Bear U, DO    100 mg at 06/10/22 1016   busPIRone (BUSPAR) tablet 5 mg  5 mg Oral BID Eugenie Filler, MD   5 mg at 06/16/22 7096   Chlorhexidine Gluconate Cloth 2 % PADS 6 each  6 each Topical Daily Cherene Altes, MD   6 each at 06/16/22 1059   cyanocobalamin (VITAMIN B12) tablet 1,000 mcg  1,000 mcg Oral Daily Lavina Hamman, MD   1,000 mcg at 06/16/22 0939   enoxaparin (LOVENOX) injection 40 mg  40 mg Subcutaneous Q24H Joette Catching T, MD   40 mg at 06/15/22 2040   feeding supplement (ENSURE ENLIVE / ENSURE PLUS) liquid 237 mL  237 mL Oral Q24H Tawni Millers, MD   237 mL at 06/16/22 1705   finasteride (PROSCAR) tablet 5 mg  5 mg Oral Daily Lavina Hamman, MD   5 mg at 06/16/22 2836   gabapentin (NEURONTIN) capsule 100 mg  100 mg Oral BID Lavina Hamman, MD   100 mg at 06/16/22 6294   hydrOXYzine (ATARAX) tablet 25 mg  25 mg Oral TID PRN Tawni Millers, MD   25 mg at 06/16/22 1702   hyoscyamine (LEVSIN SL) SL tablet 0.125 mg  0.125 mg Sublingual Q6H PRN Lavina Hamman, MD       insulin aspart (novoLOG) injection 0-9 Units  0-9 Units Subcutaneous 2 times per day Emiliano Dyer, [facility]   1 Units at 06/14/22 2129   lidocaine (LIDODERM) 5 % 1 patch  1 patch Transdermal Q24H Cristal Generous, NP   1 patch at 06/16/22 1705   methocarbamol (ROBAXIN) tablet 500 mg  500 mg Oral TID Lavina Hamman, MD   500 mg at 06/16/22 1703   morphine (MSIR) tablet 15 mg  15 mg Oral Q3H PRN Lavina Hamman, MD   15 mg at 06/16/22 1320   ondansetron (ZOFRAN) injection 4 mg  4 mg Intravenous Q6H PRN Lavina Hamman, MD       Or   ondansetron [facility]) tablet 4 mg  4 mg Oral Q6H PRN Lavina Hamman, MD   4 mg at 06/15/22 1354   Oral care mouth rinse  15 mL Mouth Rinse PRN Candee Furbish, MD   15 mL at 06/09/22 0805   pantoprazole (PROTONIX) EC tablet 40 mg  40 mg Oral BID AC Lavina Hamman, MD   40 mg at 06/16/22 1702   psyllium (HYDROCIL/METAMUCIL) 1 packet  1 packet Oral Daily PRN Lavina Hamman,  MD       sertraline (ZOLOFT) tablet 125 mg  125 mg Oral Daily Suella Broad, FNP   125 mg at 06/16/22 1320   simethicone (MYLICON) chewable tablet 80 mg  80 mg Oral TID WC & HS Eugenie Filler, MD   80 mg at 06/16/22 1703   sodium chloride flush (NS) 0.9 % injection 10-40 mL  10-40 mL Intracatheter PRN Cherene Altes, MD   20 mL at 06/12/22 1748   tamsulosin (FLOMAX) capsule 0.4 mg  0.4 mg Oral QPC supper Eugenie Filler, MD   0.4 mg at 76/54/65 0354   TPN CYCLIC-ADULT (ION)   Intravenous Cyclic-See Admin Instructions Lavina Hamman, MD        Musculoskeletal: Strength & Muscle Tone: Laying in bed   Gait & Station: Laying in bed   Patient leans: Laying in bed      Psychiatric  Specialty Exam: Psychiatric Specialty Exam: Physical Exam Vitals and nursing note reviewed.  Constitutional:      Appearance: Normal appearance. He is normal weight.  Skin:    General: Skin is warm and dry.     Capillary Refill: Capillary refill takes less than 2 seconds.  Neurological:     General: No focal deficit present.     Mental Status: He is alert and oriented to person, place, and time. Mental status is at baseline.  Psychiatric:        Attention and Perception: Attention and perception normal.        Mood and Affect: Affect normal. Mood is anxious and depressed.        Speech: Speech normal.        Behavior: Behavior normal. Behavior is cooperative.        Thought Content: Thought content normal.        Cognition and Memory: Cognition normal.        Judgment: Judgment normal.     Review of Systems  Psychiatric/Behavioral:  Positive for depression. Negative for hallucinations, substance abuse and suicidal ideas (denies). The patient is nervous/anxious. The patient does not have insomnia.   All other systems reviewed and are negative.   Blood pressure 116/64, pulse 68, temperature 97.9 F (36.6 C), temperature source Oral, resp. rate 16, height _0  (1.778 m), weight 69.6  kg, SpO2 96 %.Body mass index is 22.02 kg/m.  General Appearance: Fairly Groomed  Eye Contact:  Good  Speech:  Clear and Coherent and Normal Rate  Volume:  Normal  Mood:  Anxious and Depressed  Affect:  Appropriate and Congruent  Thought Process:  Coherent, Goal Directed, Linear, and Descriptions of Associations: Intact  Orientation:  Full (Time, Place, and Person)  Thought Content:  Logical  Suicidal Thoughts:  No  Homicidal Thoughts:  No  Memory:  Immediate;   Fair Recent;   Fair  Judgement:  Fair  Insight:  Fair  Psychomotor Activity:  Normal  Concentration:  Concentration: Fair and Attention Span: Fair  Recall:  [employer] of Knowledge:  Good  Language:  Good  Akathisia:  No  Handed:  Right  AIMS (if indicated):     Assets:  Communication Skills Desire for Improvement Financial Resources/Insurance Leisure Time Resilience Social Support  ADL's:  Intact  Cognition:  WNL  Sleep:         Physical Exam: Physical Exam Vitals and nursing note reviewed.  Constitutional:      Appearance: Normal appearance. He is normal weight.  Skin:    General: Skin is warm and dry.     Capillary Refill: Capillary refill takes less than 2 seconds.  Neurological:     General: No focal deficit present.     Mental Status: He is alert and oriented to person, place, and time. Mental status is at baseline.  Psychiatric:        Attention and Perception: Attention and perception normal.        Mood and Affect: Affect normal. Mood is anxious and depressed.        Speech: Speech normal.        Behavior: Behavior normal. Behavior is cooperative.        Thought Content: Thought content normal.        Cognition and Memory: Cognition normal.        Judgment: Judgment normal.    Review of Systems  Psychiatric/Behavioral:  Positive for depression. Negative for hallucinations, substance abuse  and suicidal ideas (denies). The patient is nervous/anxious. The patient does not have insomnia.   All  other systems reviewed and are negative.   Blood pressure 116/64, pulse 68, temperature 97.9 F (36.6 C), temperature source Oral, resp. rate 16, height _0  (1.778 m), weight 69.6 kg, SpO2 96 %. Body mass index is 22.02 kg/m.  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 Continue with referral to [facility], as patient has been difficult to place. Begin safety planning and disposition with family (establish outpatient).  -Continue sertraline 125 mg p.o. daily.  -Will order care consult for the patient to go outside.  He will need to remain with safety sitter and additional staff.  Disposition: Recommend psychiatric Inpatient admission when medically cleared.  Suella Broad, FNP 06/16/2022 5:36 PM

## 2022-06-16 NOTE — Progress Notes (Signed)
Physical Therapy Treatment Patient Details Name: [name] MRN: [id number] DOB: [birth year] Today's Date: 06/16/2022   History of Present Illness 71 year old male with complicated past medical history as outlined below, which is significant for Crohn's disease with persistent abdominal pain.  On April of this year he underwent exploratory laparotomy with small bowel resection, repair of enterotomy, and creation of a diverting loop ileostomy at [facility].  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  He has been residing in [facility] SNF from where he presented to the emergency department on 10/27 for pain at the site of his ostomy with foul-smelling discharge.  Work-up of the ostomy and abdomen including CT imaging was unremarkable.  He was discharged to SNF, however, rather than returning to SNF he found private transport to his home.Then on 10/28 he again presented to [facility] emergency department with chief complaint of unresponsiveness.11/2 reveals that ingestion of pills at home was in fact a suicide attempt. 1:1 sitter, psych consult    PT Comments    Pt has made GREAT gains.  Pt is looking so much better and eating more.  Also more engaging and appears more happy.  Pt is now amb without any AD at Mod Indep/Supervision.   Pt has met his maximal mobility for this Acute Care setting.  Will Update/Consult LPT. Pt is on waiting list for Inpt [facility]ych but Sister has also stepped up to take him to her house.    Recommendations for follow up therapy are one component of a multi-disciplinary discharge planning process, led by the attending physician.  Recommendations may be updated based on patient status, additional functional criteria and insurance authorization.  Follow Up Recommendations  Home health PT Can patient physically be transported by private vehicle: Yes   Assistance Recommended at Discharge Intermittent Supervision/Assistance  Patient can return home with the following A little  help with walking and/or transfers;A little help with bathing/dressing/bathroom;Assistance with cooking/housework;Assist for transportation;Help with stairs or ramp for entrance;Direct supervision/assist for medications management   Equipment Recommendations  None recommended by PT    Recommendations for Other Services       Precautions / Restrictions Precautions Precautions: Fall Precaution Comments: ileostomy Restrictions Weight Bearing Restrictions: No     Mobility  Bed Mobility Overal bed mobility: Independent             General bed mobility comments: self able    Transfers Overall transfer level: Modified independent                 General transfer comment: self able    Ambulation/Gait Ambulation/Gait assistance: Supervision, Modified independent (Device/Increase time) Gait Distance (Feet): 550 Feet Assistive device: None Gait Pattern/deviations: Step-through pattern Gait velocity: decreased     General Gait Details: pt tolerated amb >500 feet with NO AD at a functional speed, aletnating gait and no LOB.   Stairs Stairs: Yes Stairs assistance: Supervision, Min guard Stair Management: Two rails Number of Stairs: 2 General stair comments: usind B rails pt was able to safely navigate 2 steps up forward/alternating steps.  Coming daown, pt performed one step at a time with slight unsteadiness but self able with rails.   Wheelchair Mobility    Modified Rankin (Stroke Patients Only)       Balance  Cognition Arousal/Alertness: Awake/alert Behavior During Therapy: WFL for tasks assessed/performed                                   General Comments: Pt is looking so much better and eating more.  Also more engaging and appears more happy.        Exercises      General Comments        Pertinent Vitals/Pain Pain Assessment Pain Assessment: Faces Faces Pain  Scale: Hurts a little bit Pain Location: ABD Pain Descriptors / Indicators: Aching Pain Intervention(s): Monitored during session, Repositioned    Home Living                          Prior Function            PT Goals (current goals can now be found in the care plan section) Progress towards PT goals: Progressing toward goals    Frequency    Min 3X/week      PT Plan Current plan remains appropriate    Co-evaluation              AM-PAC PT "6 Clicks" Mobility   Outcome Measure  Help needed turning from your back to your side while in a flat bed without using bedrails?: None Help needed moving from lying on your back to sitting on the side of a flat bed without using bedrails?: None Help needed moving to and from a bed to a chair (including a wheelchair)?: None Help needed standing up from a chair using your arms (e.g., wheelchair or bedside chair)?: None Help needed to walk in hospital room?: None Help needed climbing 3-5 steps with a railing? : A Little 6 Click Score: 23    End of Session Equipment Utilized During Treatment: Gait belt Activity Tolerance: Patient tolerated treatment well Patient left: in bed;with family/visitor present;with call bell/phone within reach Nurse Communication: Mobility status PT Visit Diagnosis: Difficulty in walking, not elsewhere classified (R26.2)     Time: 8366-2947 PT Time Calculation (min) (ACUTE ONLY): 10 min  Charges:  $Gait Training: 8-22 mins                     Rica Koyanagi  PTA [place] Office M-F          [phone] Weekend pager [phone]

## 2022-06-16 NOTE — Plan of Care (Signed)
  Problem: Activity: Goal: Risk for activity intolerance will decrease Outcome: Progressing   Problem: Nutrition: Goal: Adequate nutrition will be maintained Outcome: Progressing   Problem: Coping: Goal: Level of anxiety will decrease Outcome: Progressing   

## 2022-06-16 NOTE — Progress Notes (Signed)
PHARMACY - TOTAL PARENTERAL NUTRITION CONSULT NOTE   Indication: Crohn's disease and complex past surgical history including ex lap with small bowel resection, lysis of adhesion, mesh explantation, and diverting loop ileostomy in April 2023, on TPN prior to admission  Patient Measurements: Height: _0  (177.8 cm) Weight: 69.6 kg (153 lb 7 oz) IBW/kg (Calculated) : 73 TPN AdjBW (KG): 68.1 Body mass index is 22.02 kg/m.  Assessment: Patient with PMH of Crohn's disease and complex surgical history as above on chronic TPN who presented on 05/13/22 with altered mental status after spouse found patient unresponsive with multiple pill bottles open around him. Patient was transported from SNF to ED the day prior for generalized weakness and acute worsening of his chronic abdominal pain. Patient decided to return home instead of his SNF. Pharmacy consulted to resume TPN as inpatient.   Discharge from [place] 04/19/22:  TPN pharmacist at [place]:  Estimated Nutrient Requirements: Protein: 1.5 - 1.7 g/kg/day (98 - 104 g/day), Total kcal: 30 - 35 kcal/kg/day (1830 - 1983 kcal/day)  TPN Formulation at [place] per TPN pharmacist: [A 6.6%, D 20%, F 3.1%] @ goal rate, 65 mL/hr (TV: 1560 mL/day) provides: Protein 1.7 g/kg/day 103 g/day, Total kcal 32 kcal/kg/day 1971 kcal/day  NPC: N ratio 94   Admitted at [place] 04/29/22-05/03/22: Nutritional Goals: TPN 80 mL/hr (provides 113 g of protein and 1943 kcals per day) RD Assessment: (10/16): 2100-2450kcal (30-35kcal/kg), Protein 105-130g (1.5-1.8g/kg) Utilize intralipid rather than SMOF as 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  [facility]: [name], PharmD, ph: [phone] 10/28 Most recent formula: Total volume 1560 mL:  Dextrose 220 g/day, Plenamine 120 g/day, SMOFlipid 50 g/day, NaCl 75 mEq, CaGluc 6 mEq, Potassium Ac 20 mEq, Potassium Phos 13.5 nM, Mag Sulfate 8 mEq, Vitamins/trace  elements   GI issues: Crohn's dz with chronic TPN, aspiration, dysphagia (followed by speech), severe PCM, ileostomy leakage  - PICC line placed in September 24 with the plan to continue TPN until the patient gains 30 to 40 pounds and then consider ileostomy closure.  Glucose / Insulin: No hx DM.  CBGs at goal <150. (CBGs BID), 0 unit SSI Electrolytes: Lytes wnl including CorrCa  Renal: SCr ok; BUN slightly elevated at 34; UOP ok Hepatic: LFTs trending down, Alk phos remains elevated , Tbili WNL - Albumin low 2.7 - TG 109 (11/23). 100 (11/27)  I/O:  - ileostomy output 1083  mL up, UOP likely not fully charted  - LBM 11/29 GI Imaging: - 10/14 CTa/p: previous surgical changes noted (transverse-sigmoid anastomosis) with stable inflammation since 10/12 CT - 10/27 CTa/p: no significant interval change since 10/14 CT. No evidence of bowel obstruction or other complicating feature at the anastomotic site or ostomy. - 11/6 abd [place] - normal liver (checked d/t elevated LFTs) - 11/20 CT: Same postoperative changes. Inflammatory change in the right lower quadrant is not significantly changed since 05/12/2022. New patchy opacity in the right lung base. GI Surgeries / Procedures: n/a  Central access: prior to admission - double lumen CVC (03/2022, 130lbs) TPN start date: started PTA  Nutritional Goals: TPN provides 138 g of protein and 2222  kcals per day.  RD Assessment this admission:    Estimated Needs Total Energy Estimated Needs: 2015-2350kcal Total Protein Estimated Needs: 100-135g Total Fluid Estimated Needs: 2427m (Holliday Segar)  Current Nutrition:  TPN Dysphagia-3 11/9 >> Regular 11/17 >>  Dysphagia 3 11/21 >>   Plan:  Continue Cyclic TPN to 4970 ml over 14 hrs Will provide (incr) 138g AA, 25.91% lipids, 2222 kcal GIR 3-6.1 Electrolytes in TPN:  Na 75 mEq/L K 40 mEq/L Ca 5 mEq/L Mg 5 mEq/L Phos 15 mmol/L Cl:Ac = 1:1 Add standard MVI and trace elements to TPN  With  excellent CBG control, will limit finger sticks to twice daily.  Timed 2 hrs after TPN start, 1 hour after TPN stop  IVF dc'd by MD Monitor TPN labs on Mon/Thurs at minimum and PRN As labs have been very stable, will allow next labs to be ordered on Monday as per usual TPN labs   Royetta Asal, PharmD, BCPS 06/16/2022 7:33 AM

## 2022-06-16 NOTE — Progress Notes (Signed)
Mobility Specialist - Progress Note   06/16/22 1448  Mobility  Activity Ambulated independently in hallway  Level of Assistance Independent  Assistive Device None  Distance Ambulated (ft) 200 ft  Activity Response Tolerated well  Mobility Referral Yes  $Mobility charge 1 Mobility   Pt received in bed and agreeable to mobility. No complaints during mobility. Pt to bed after session with all needs met.      [facility]

## 2022-06-17 DIAGNOSIS — G9341 Metabolic encephalopathy: Secondary | ICD-10-CM | POA: Diagnosis not present

## 2022-06-17 LAB — COMPREHENSIVE METABOLIC PANEL
ALT: 39 U/L (ref 0–44)
AST: 43 U/L — ABNORMAL HIGH (ref 15–41)
Albumin: 2.6 g/dL — ABNORMAL LOW (ref 3.5–5.0)
Alkaline Phosphatase: 111 U/L (ref 38–126)
Anion gap: 4 — ABNORMAL LOW (ref 5–15)
BUN: 32 mg/dL — ABNORMAL HIGH (ref 8–23)
CO2: 26 mmol/L (ref 22–32)
Calcium: 8.2 mg/dL — ABNORMAL LOW (ref 8.9–10.3)
Chloride: 110 mmol/L (ref 98–111)
Creatinine, Ser: 0.9 mg/dL (ref 0.61–1.24)
GFR, Estimated: >60 mL/min (ref >60)
Glucose, Bld: 95 mg/dL (ref 70–99)
Potassium: 3.8 mmol/L (ref 3.5–5.1)
Sodium: 140 mmol/L (ref 135–145)
Total Bilirubin: 0.4 mg/dL (ref 0.3–1.2)
Total Protein: 5.9 g/dL — ABNORMAL LOW (ref 6.5–8.1)

## 2022-06-17 LAB — GLUCOSE, CAPILLARY
Glucose-Capillary: 111 mg/dL — ABNORMAL HIGH (ref 70–99)
Glucose-Capillary: 118 mg/dL — ABNORMAL HIGH (ref 70–99)

## 2022-06-17 MED ORDER — TRAVASOL 10 % IV SOLN
INTRAVENOUS | Status: AC
  Filled 2022-06-17: qty 1380

## 2022-06-17 NOTE — Progress Notes (Signed)
Mobility Specialist - Progress Note   06/17/22 1016  Mobility  Activity Ambulated independently in hallway  Level of Assistance Independent  Assistive Device None  Distance Ambulated (ft) 40 ft  Activity Response Tolerated well  Mobility Referral Yes  $Mobility charge 1 Mobility   Pt received in bed and agreeable to mobility. C/o stomach ache during mobility. Pt to bed after session with all needs met & NT in room.    [facility]

## 2022-06-17 NOTE — Progress Notes (Signed)
PT Note  Patient Details Name: [name] MRN: [id number] DOB: 07/25/1950       Pt has met all PT goals and progressing well with mobility team. Will sign off from PT and pt will continue to mobilize with mobility team while here. No PT follow up needs and no assistive device needs at this time. Thank you    ,  06/17/2022, 10:21 AM , PT, MPT Acute Rehabilitation Services Office: [phone] If a weekend: [facility] w/e pager [phone] 06/17/2022    

## 2022-06-17 NOTE — Plan of Care (Signed)
  Problem: Education: Goal: Knowledge of General Education information will improve Description: Including pain rating scale, medication(s)/side effects and non-pharmacologic comfort measures Outcome: Progressing   Problem: Health Behavior/Discharge Planning: Goal: Ability to manage health-related needs will improve Outcome: Progressing   Problem: Clinical Measurements: Goal: Ability to maintain clinical measurements within normal limits will improve Outcome: Progressing   Problem: Coping: Goal: Level of anxiety will decrease Outcome: Progressing   Problem: Elimination: Goal: Will not experience complications related to urinary retention Outcome: Progressing   Problem: Pain Managment: Goal: General experience of comfort will improve Outcome: Progressing   Problem: Safety: Goal: Ability to remain free from injury will improve Outcome: Progressing   Problem: Skin Integrity: Goal: Risk for impaired skin integrity will decrease Outcome: Progressing

## 2022-06-17 NOTE — Progress Notes (Signed)
[facility] PROGRESS NOTE  Patient: [name] [id number]:[id number]   PCP: [name], MD DOB: Mar 03, 1951   DOA: 05/13/2022   DOS: 06/17/2022    Subjective: No acute complaint.  No nausea vomiting or no fever no chills.  Had some cramping last night currently resolved.  Objective:   Vitals:   06/16/22 2146 06/17/22 0256 06/17/22 0618 06/17/22 1207  BP: 116/62 (!) 126/56 139/70   Pulse: 63 68 73   Resp: 16 18 18    Temp: 97.8 F (36.6 C) 97.9 F (36.6 C) 97.8 F (36.6 C)   TempSrc: Oral Oral Axillary   SpO2: 99% 99% 99%   Weight:    70.4 kg  Height:        Clear to auscultation. Bowel sound present. S1-S2 present. No edema.  Assessment and plan: Active Issues Suicidal ideation. Landscape architect. Plan is to go to sister's home.  Social worker will work on Chief Executive Officer on Monday.  Brief Hospital course [phone] with hx Crohn's with persistent abd pain s/p extensive surgeries and is currently TPN dependent. Pt presented with unresponsiveness in apparent suicide attempt via overdose. Pt remained unresponsive and later was unable to maintain airway, prompting intubation and transfer to ICU. Pt now extubated and care transferred to hospitalist.  Medically stable.  Requiring inpatient psychiatric placement.   Principal Problem:   Metabolic encephalopathy Active Problems:   Chronic anemia   Acute respiratory failure with hypoxia (HCC)   Severe malnutrition (HCC)   Generalized weakness   Drug overdose, intentional, initial encounter ([place])   Chronic pain syndrome   Aspiration pneumonia [facility])    Author: Berle Mull, MD [facility] 06/17/2022 6:34 PM   If 7PM-7AM, please contact night-coverage at [email or web address]

## 2022-06-17 NOTE — Progress Notes (Signed)
PHARMACY - TOTAL PARENTERAL NUTRITION CONSULT NOTE   Indication: Crohn's disease and complex past surgical history including ex lap with small bowel resection, lysis of adhesion, mesh explantation, and diverting loop ileostomy in April 2023, on TPN prior to admission  Patient Measurements: Height: 5' 10"  (177.8 cm) Weight: 69.6 kg (153 lb 7 oz) IBW/kg (Calculated) : 73 TPN AdjBW (KG): 68.1 Body mass index is 22.02 kg/m.  Assessment: Patient with PMH of Crohn's disease and complex surgical history as above on chronic TPN who presented on 05/13/22 with altered mental status after spouse found patient unresponsive with multiple pill bottles open around him. Patient was transported from SNF to ED the day prior for generalized weakness and acute worsening of his chronic abdominal pain. Patient decided to return home instead of his SNF. Pharmacy consulted to resume TPN as inpatient.   Discharge from [place] 04/19/22:  TPN pharmacist at [place]:  Estimated Nutrient Requirements: Protein: 1.5 - 1.7 g/kg/day (98 - 104 g/day), Total kcal: 30 - 35 kcal/kg/day (1830 - 1983 kcal/day)  TPN Formulation at [place] per TPN pharmacist: [A 6.6%, D 20%, F 3.1%] @ goal rate, 65 mL/hr (TV: 1560 mL/day) provides: Protein 1.7 g/kg/day 103 g/day, Total kcal 32 kcal/kg/day 1971 kcal/day  NPC: N ratio 94   Admitted at [place] 04/29/22-05/03/22: Nutritional Goals: TPN 80 mL/hr (provides 113 g of protein and 1943 kcals per day) RD Assessment: (10/16): 2100-2450kcal (30-35kcal/kg), Protein 105-130g (1.5-1.8g/kg) Utilize intralipid rather than SMOF as 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  [facility]: [name], PharmD, ph: [phone] 10/28 Most recent formula: Total volume 1560 mL:  Dextrose 220 g/day, Plenamine 120 g/day, SMOFlipid 50 g/day, NaCl 75 mEq, CaGluc 6 mEq, Potassium Ac 20 mEq, Potassium Phos 13.5 nM, Mag Sulfate 8 mEq, Vitamins/trace  elements   GI issues: Crohn's dz with chronic TPN, aspiration, dysphagia (followed by speech), severe PCM, ileostomy leakage  - PICC line placed in September 24 with the plan to continue TPN until the patient gains 30 to 40 pounds and then consider ileostomy closure.  Glucose / Insulin: No hx DM.  CBGs at goal <150. (CBGs BID), 0 unit SSI Electrolytes: Lytes wnl including CorrCa  Renal: SCr ok; BUN slightly elevated; UOP ok Hepatic: LFTs improved, Tbili WNL - Albumin low 2.6 - TG 109 (11/23), 100 (11/27)  I/O:  - ileostomy output 1970 mL, UOP 1650 mL charted - LBM 12/2 GI Imaging: - 10/14 CTa/p: previous surgical changes noted (transverse-sigmoid anastomosis) with stable inflammation since 10/12 CT - 10/27 CTa/p: no significant interval change since 10/14 CT. No evidence of bowel obstruction or other complicating feature at the anastomotic site or ostomy. - 11/6 abd [place] - normal liver (checked d/t elevated LFTs) - 11/20 CT: Same postoperative changes. Inflammatory change in the right lower quadrant is not significantly changed since 05/12/2022. New patchy opacity in the right lung base. GI Surgeries / Procedures: n/a  Central access: prior to admission - double lumen CVC (03/2022, 130lbs) TPN start date: started PTA  Nutritional Goals: TPN provides 138 g of protein and 2222  kcals per day.  RD Assessment this admission:    Estimated Needs Total Energy Estimated Needs: 2015-2350kcal Total Protein Estimated Needs: 100-135g Total Fluid Estimated Needs: 2435m (Holliday Segar)  Current Nutrition:  TPN Dysphagia-3 11/9 >> Regular 11/17 >>  Dysphagia 3 11/21 >>   Plan:  Continue Cyclic TPN at 22229ml over 14 hrs Will provide 138  g AA, 25.91% lipids, 2222 kcal GIR 3-6.1 Electrolytes in TPN:  Na 75 mEq/L K 40 mEq/L Ca 5 mEq/L Mg 5 mEq/L Phos 15 mmol/L Cl:Ac = 1:1 Add standard MVI and trace elements to TPN  With excellent CBG control, will limit finger sticks to twice daily.   Timed 2 hrs after TPN start, 1 hour after TPN stop  Monitor TPN labs on Mon/Thurs at minimum and PRN   Tawnya Crook, PharmD, BCPS Clinical Pharmacist 06/17/2022 8:38 AM

## 2022-06-18 DIAGNOSIS — G9341 Metabolic encephalopathy: Secondary | ICD-10-CM | POA: Diagnosis not present

## 2022-06-18 LAB — COMPREHENSIVE METABOLIC PANEL
ALT: 7 U/L (ref 0–44)
AST: 38 U/L (ref 15–41)
Albumin: 2.6 g/dL — ABNORMAL LOW (ref 3.5–5.0)
Alkaline Phosphatase: 107 U/L (ref 38–126)
Anion gap: 5 (ref 5–15)
BUN: 31 mg/dL — ABNORMAL HIGH (ref 8–23)
CO2: 25 mmol/L (ref 22–32)
Calcium: 8.1 mg/dL — ABNORMAL LOW (ref 8.9–10.3)
Chloride: 110 mmol/L (ref 98–111)
Creatinine, Ser: 0.9 mg/dL (ref 0.61–1.24)
GFR, Estimated: >60 mL/min (ref >60)
Glucose, Bld: 110 mg/dL — ABNORMAL HIGH (ref 70–99)
Potassium: 3.8 mmol/L (ref 3.5–5.1)
Sodium: 140 mmol/L (ref 135–145)
Total Bilirubin: 0.6 mg/dL (ref 0.3–1.2)
Total Protein: 5.9 g/dL — ABNORMAL LOW (ref 6.5–8.1)

## 2022-06-18 LAB — GLUCOSE, CAPILLARY
Glucose-Capillary: 109 mg/dL — ABNORMAL HIGH (ref 70–99)
Glucose-Capillary: 121 mg/dL — ABNORMAL HIGH (ref 70–99)

## 2022-06-18 MED ORDER — TRAVASOL 10 % IV SOLN
INTRAVENOUS | Status: AC
  Filled 2022-06-18: qty 1380

## 2022-06-18 NOTE — Plan of Care (Signed)
  Problem: Education: Goal: Knowledge of General Education information will improve Description: Including pain rating scale, medication(s)/side effects and non-pharmacologic comfort measures Outcome: Progressing   Problem: Nutrition: Goal: Adequate nutrition will be maintained Outcome: Progressing   

## 2022-06-18 NOTE — Plan of Care (Signed)
  Problem: Education: Goal: Knowledge of General Education information will improve Description Including pain rating scale, medication(s)/side effects and non-pharmacologic comfort measures Outcome: Progressing   

## 2022-06-18 NOTE — Progress Notes (Signed)
PHARMACY - TOTAL PARENTERAL NUTRITION CONSULT NOTE   Indication: Crohn's disease and complex past surgical history including ex lap with small bowel resection, lysis of adhesion, mesh explantation, and diverting loop ileostomy in April 2023, on TPN prior to admission  Patient Measurements: Height: 5' 10"  (177.8 cm) Weight: 70.4 kg (155 lb 3.2 oz) IBW/kg (Calculated) : 73 TPN AdjBW (KG): 68.1 Body mass index is 22.27 kg/m.  Assessment: Patient with PMH of Crohn's disease and complex surgical history as above on chronic TPN who presented on 05/13/22 with altered mental status after spouse found patient unresponsive with multiple pill bottles open around him. Patient was transported from SNF to ED the day prior for generalized weakness and acute worsening of his chronic abdominal pain. Patient decided to return home instead of his SNF. Pharmacy consulted to resume TPN as inpatient.   Discharge from [place] 04/19/22:  TPN pharmacist at [place]:  Estimated Nutrient Requirements: Protein: 1.5 - 1.7 g/kg/day (98 - 104 g/day), Total kcal: 30 - 35 kcal/kg/day (1830 - 1983 kcal/day)  TPN Formulation at [place] per TPN pharmacist: [A 6.6%, D 20%, F 3.1%] @ goal rate, 65 mL/hr (TV: 1560 mL/day) provides: Protein 1.7 g/kg/day 103 g/day, Total kcal 32 kcal/kg/day 1971 kcal/day  NPC: N ratio 94   Admitted at [place] 04/29/22-05/03/22: Nutritional Goals: TPN 80 mL/hr (provides 113 g of protein and 1943 kcals per day) RD Assessment: (10/16): 2100-2450kcal (30-35kcal/kg), Protein 105-130g (1.5-1.8g/kg) Utilize intralipid rather than SMOF as 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  [facility]: [name], PharmD, ph: [phone] 10/28 Most recent formula: Total volume 1560 mL:  Dextrose 220 g/day, Plenamine 120 g/day, SMOFlipid 50 g/day, NaCl 75 mEq, CaGluc 6 mEq, Potassium Ac 20 mEq, Potassium Phos 13.5 nM, Mag Sulfate 8 mEq, Vitamins/trace  elements   GI issues: Crohn's dz with chronic TPN, aspiration, dysphagia (followed by speech), severe PCM, ileostomy leakage  - PICC line placed in September 24 with the plan to continue TPN until the patient gains 30 to 40 pounds and then consider ileostomy closure.  Glucose / Insulin: No hx DM.  CBGs at goal <150. (CBGs BID), 0 unit SSI Electrolytes: Lytes wnl including CorrCa  Renal: SCr ok; BUN slightly elevated; UOP ok Hepatic: LFTs improved, Tbili WNL - Albumin low 2.6 - TG 109 (11/23), 100 (11/27)  I/O:  - no ileostomy output documented previous 24 hr, UOP 575 mL charted - LBM 12/2 GI Imaging: - 10/14 CTa/p: previous surgical changes noted (transverse-sigmoid anastomosis) with stable inflammation since 10/12 CT - 10/27 CTa/p: no significant interval change since 10/14 CT. No evidence of bowel obstruction or other complicating feature at the anastomotic site or ostomy. - 11/6 abd [place] - normal liver (checked d/t elevated LFTs) - 11/20 CT: Same postoperative changes. Inflammatory change in the right lower quadrant is not significantly changed since 05/12/2022. New patchy opacity in the right lung base. GI Surgeries / Procedures: n/a  Central access: prior to admission - double lumen CVC (03/2022, 130lbs) TPN start date: started PTA  Nutritional Goals: TPN provides 138 g of protein and 2222  kcals per day.  RD Assessment this admission:    Estimated Needs Total Energy Estimated Needs: 2015-2350kcal Total Protein Estimated Needs: 100-135g Total Fluid Estimated Needs: 246m (Holliday Segar)  Current Nutrition:  TPN Dysphagia-3 11/9 >> Regular 11/17 >>  Dysphagia 3 11/21 >>   Plan:  Continue Cyclic TPN at 23664ml over 14 hrs  Will provide 138 g AA, 25.91% lipids, 2222 kcal GIR 3-6.1 Electrolytes in TPN:  Na 75 mEq/L K 40 mEq/L Ca 5 mEq/L Mg 5 mEq/L Phos 15 mmol/L Cl:Ac = 1:1 Add standard MVI and trace elements to TPN  With excellent CBG control, will limit finger sticks  to twice daily.  Timed 2 hrs after TPN start, 1 hour after TPN stop  Monitor TPN labs on Mon/Thurs at minimum and PRN   Tawnya Crook, PharmD, BCPS Clinical Pharmacist 06/18/2022 9:17 AM

## 2022-06-18 NOTE — Progress Notes (Signed)
[facility] PROGRESS NOTE  Patient: [name] AJL:[id number]   PCP: [name], MD DOB: 1951-01-03   DOA: 05/13/2022   DOS: 06/18/2022    Subjective: No acute complaint.  No nausea no vomiting.  Patient is concerned about his ileostomy site although per RN site appears to be clear without any irritation or acute issues.  Objective:   Vitals:   06/18/22 0636 06/18/22 1301 06/18/22 1400 06/18/22 1500  BP: (!) 110/53  119/68 (!) 112/59  Pulse: 68  60 77  Resp: 16  16 16   Temp: (!) 97.5 F (36.4 C)  98.1 F (36.7 C) 98.2 F (36.8 C)  TempSrc:   Oral Oral  SpO2: 96%  99% 100%  Weight:  70.9 kg    Height:        Clear to auscultation. Bowel sound present. No edema.  Assessment and plan: Active Issues Suicidal ideation. Currently psychiatrically remaining stable. Plan is to go to sisters place once logistics of TPN and home health is arranged. Mood stable.  Ambulating well.  Brief Hospital course [phone] with hx Crohn's with persistent abd pain s/p extensive surgeries and is currently TPN dependent. Pt presented with unresponsiveness in apparent suicide attempt via overdose. Pt remained unresponsive and later was unable to maintain airway, prompting intubation and transfer to ICU. Pt now extubated and care transferred to hospitalist.  Medically stable.  Requiring inpatient psychiatric placement.   Principal Problem:   Metabolic encephalopathy Active Problems:   Chronic anemia   Acute respiratory failure with hypoxia (HCC)   Severe malnutrition (HCC)   Generalized weakness   Drug overdose, intentional, initial encounter ([place])   Chronic pain syndrome   Aspiration pneumonia [facility])   Author: Berle Mull, MD [facility] 06/18/2022 6:56 PM   If 7PM-7AM, please contact night-coverage at [email or web address]

## 2022-06-19 DIAGNOSIS — G9341 Metabolic encephalopathy: Secondary | ICD-10-CM | POA: Diagnosis not present

## 2022-06-19 LAB — COMPREHENSIVE METABOLIC PANEL
ALT: 37 U/L (ref 0–44)
AST: 42 U/L — ABNORMAL HIGH (ref 15–41)
Albumin: 2.6 g/dL — ABNORMAL LOW (ref 3.5–5.0)
Alkaline Phosphatase: 103 U/L (ref 38–126)
Anion gap: 4 — ABNORMAL LOW (ref 5–15)
BUN: 28 mg/dL — ABNORMAL HIGH (ref 8–23)
CO2: 27 mmol/L (ref 22–32)
Calcium: 8.3 mg/dL — ABNORMAL LOW (ref 8.9–10.3)
Chloride: 109 mmol/L (ref 98–111)
Creatinine, Ser: 0.94 mg/dL (ref 0.61–1.24)
GFR, Estimated: >60 mL/min (ref >60)
Glucose, Bld: 92 mg/dL (ref 70–99)
Potassium: 3.9 mmol/L (ref 3.5–5.1)
Sodium: 140 mmol/L (ref 135–145)
Total Bilirubin: 0.3 mg/dL (ref 0.3–1.2)
Total Protein: 6 g/dL — ABNORMAL LOW (ref 6.5–8.1)

## 2022-06-19 LAB — GLUCOSE, CAPILLARY
Glucose-Capillary: 106 mg/dL — ABNORMAL HIGH (ref 70–99)
Glucose-Capillary: 111 mg/dL — ABNORMAL HIGH (ref 70–99)

## 2022-06-19 LAB — PHOSPHORUS: Phosphorus: 3.7 mg/dL (ref 2.5–4.6)

## 2022-06-19 LAB — TRIGLYCERIDES: Triglycerides: 84 mg/dL (ref <150)

## 2022-06-19 LAB — MAGNESIUM: Magnesium: 2 mg/dL (ref 1.7–2.4)

## 2022-06-19 MED ORDER — TRAVASOL 10 % IV SOLN
INTRAVENOUS | Status: AC
  Filled 2022-06-19: qty 1380

## 2022-06-19 NOTE — Progress Notes (Signed)
PHARMACY - TOTAL PARENTERAL NUTRITION CONSULT NOTE   Indication: Crohn's disease and complex past surgical history including ex lap with small bowel resection, lysis of adhesion, mesh explantation, and diverting loop ileostomy in April 2023, on TPN prior to admission  Patient Measurements: Height: 5' 10"  (177.8 cm) Weight: 70.9 kg (156 lb 3.2 oz) IBW/kg (Calculated) : 73 TPN AdjBW (KG): 68.1 Body mass index is 22.41 kg/m.  Assessment: Patient with PMH of Crohn's disease and complex surgical history as above on chronic TPN who presented on 05/13/22 with altered mental status after spouse found patient unresponsive with multiple pill bottles open around him. Patient was transported from SNF to ED the day prior for generalized weakness and acute worsening of his chronic abdominal pain. Patient decided to return home instead of his SNF. Pharmacy consulted to resume TPN as inpatient.   Discharge from [facility] 04/19/22:  TPN pharmacist at [facility]:  Estimated Nutrient Requirements: Protein: 1.5 - 1.7 g/kg/day (98 - 104 g/day), Total kcal: 30 - 35 kcal/kg/day (1830 - 1983 kcal/day)  TPN Formulation at [facility] per TPN pharmacist: [A 6.6%, D 20%, F 3.1%] @ goal rate, 65 mL/hr (TV: 1560 mL/day) provides: Protein 1.7 g/kg/day 103 g/day, Total kcal 32 kcal/kg/day 1971 kcal/day  NPC: N ratio 94   Admitted at [facility] 04/29/22-05/03/22: Nutritional Goals: TPN 80 mL/hr (provides 113 g of protein and 1943 kcals per day) RD Assessment: (10/16): 2100-2450kcal (30-35kcal/kg), Protein 105-130g (1.5-1.8g/kg) Utilize intralipid rather than SMOF as 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  [facility]: [name], PharmD, ph: [phone] 10/28 Most recent formula: Total volume 1560 mL:  Dextrose 220 g/day, Plenamine 120 g/day, SMOFlipid 50 g/day, NaCl 75 mEq, CaGluc 6 mEq, Potassium Ac 20 mEq, Potassium Phos 13.5 nM, Mag Sulfate 8 mEq, Vitamins/trace  elements   GI issues: Crohn's dz with chronic TPN, aspiration, dysphagia (followed by speech), severe PCM, ileostomy leakage  - PICC line placed in September 24 with the plan to continue TPN until the patient gains 30 to 40 pounds and then consider ileostomy closure.  Glucose / Insulin: No hx DM.  - On sSSI (used 1 unit insulin in the past 24 hrs) -  CBGs at goal <150: wnl Electrolytes: Lytes wnl including CorrCa  Renal: SCr ok; BUN slightly elevated and trending down Hepatic: LFTs improved, Tbili WNL - Albumin low 2.6 - TG 109 (11/23), 100 (11/27), 84 (12/4) I/O: +1505 mL - ileostomy output: 947 mL GI Imaging: - 10/14 CTa/p: previous surgical changes noted (transverse-sigmoid anastomosis) with stable inflammation since 10/12 CT - 10/27 CTa/p: no significant interval change since 10/14 CT. No evidence of bowel obstruction or other complicating feature at the anastomotic site or ostomy. - 11/6 abd [place] - normal liver (checked d/t elevated LFTs) - 11/20 CT: Same postoperative changes. Inflammatory change in the right lower quadrant is not significantly changed since 05/12/2022. New patchy opacity in the right lung base. GI Surgeries / Procedures: n/a  Central access: prior to admission - double lumen CVC (03/2022, 130lbs) TPN start date: started PTA  Nutritional Goals: TPN provides 138 g of protein and 2222  kcals per day.  RD Assessment this admission:    Estimated Needs Total Energy Estimated Needs: 2015-2350kcal Total Protein Estimated Needs: 100-135g Total Fluid Estimated Needs: 2439m (Holliday Segar)  Current Nutrition:  TPN Dysphagia-3 11/9 >> Regular 11/17 >>  Dysphagia 3 11/21 >>   Plan:  Continue Cyclic TPN at 22458ml  over 14 hrs Will provide 138 g AA, 25.91% lipids, 2222 kcal GIR 3-6.1 Electrolytes in TPN:  Na 75 mEq/L K 40 mEq/L Ca 5 mEq/L Mg 5 mEq/L Phos 15 mmol/L Cl:Ac = 1:1 Add standard MVI and trace elements to TPN  With excellent CBG control, will limit  finger sticks to twice daily.  Timed 2 hrs after TPN start, 1 hour after TPN stop  Monitor TPN labs on Mon/Thurs at minimum and PRN   Lynelle Doctor, PharmD, BCPS Clinical Pharmacist 06/19/2022 6:46 AM

## 2022-06-19 NOTE — Progress Notes (Signed)
[facility] PROGRESS NOTE  Patient: [name] WTK:[id number]   PCP: [name], MD DOB: [birth year]   DOA: 05/13/2022   DOS: 06/19/2022    Subjective: No acute complaints.  Continues to have abdominal cramp intermittently.  Tells me that most amount of pain that he has is in the morning.  Objective:   Vitals:   06/18/22 1400 06/18/22 1500 06/19/22 0608 06/19/22 1318  BP: 119/68 (!) 112/59 (!) 122/56 116/65  Pulse: 60 77 70 72  Resp: 16 16 16 17   Temp: 98.1 F (36.7 C) 98.2 F (36.8 C) 97.8 F (36.6 C) (!) 97.5 F (36.4 C)  TempSrc: Oral Oral Oral   SpO2: 99% 100% 97% 97%  Weight:      Height:        S1-S2 present Bowel sound present. Clear to auscultation.  Assessment and plan: Active Issues Abdominal cramp. Recommend to request Bentyl which is ordered as needed. If it will help, will consider switching it to scheduled.  Suicidal attempt. Psychiatry helping. Remaining stable for now. [facility] TOC help appreciated.  Brief Hospital course [phone] with hx Crohn's with persistent abd pain s/p extensive surgeries and is currently TPN dependent. Pt presented with unresponsiveness in apparent suicide attempt via overdose. Pt remained unresponsive and later was unable to maintain airway, prompting intubation and transfer to ICU. Pt now extubated and care transferred to hospitalist.  Medically stable.  Requiring inpatient psychiatric placement.   Principal Problem:   Metabolic encephalopathy Active Problems:   Chronic anemia   Acute respiratory failure with hypoxia (HCC)   Severe malnutrition (HCC)   Generalized weakness   Drug overdose, intentional, initial encounter ([place])   Chronic pain syndrome   Aspiration pneumonia [facility])    Author: Berle Mull, MD [facility] 06/19/2022 6:41 PM   If 7PM-7AM, please contact night-coverage at [email or web address]

## 2022-06-19 NOTE — Consult Note (Signed)
Face-to-Face Psychiatry Consult   Reason for Consult:  Suicide Attempt  Referring Physician:  Red Christians   Patient Identification: [name] MRN:  [id number] Principal Diagnosis: Metabolic encephalopathy Diagnosis:  Principal Problem:   Metabolic encephalopathy Active Problems:   Chronic anemia   Acute respiratory failure with hypoxia (HCC)   Severe malnutrition (HCC)   Generalized weakness   Drug overdose, intentional, initial encounter ([place])   Chronic pain syndrome   Aspiration pneumonia ([place])   Total Time spent with patient: 20 minutes  Subjective:   71yo with hx Crohn's with persistent abd pain s/p extensive surgeries and is currently TPN dependent. Pt presented with unresponsiveness in apparent suicide attempt via overdose.    Currently on interview, the patient. Alert and oriented x4. Patient reports presenting to the emergency department after a suicide attempt via after taking multiple pills in an attempt to end his life. Over the past couple months he reports worsening depression and anxiety. He reports symptoms of depression including hopelessness, guilt/worthlessness, disturbed sleep pattern (sleeping 3 to 4 hours during the day) loss of interest, low energy, decreased appetite, isolative, crying spells deterioration of ADLs and recurrent thoughts of death.  He reports that his symptoms have increased due to multiple stomach surgeries, deconditioning, and increase in pain. Patient is on TPN to improve nutrition, assist with weight gain. He is also on a dysphagia 3 diet at this time, with no assistance required to feed himself. Patient has met his medically goals and now medically stable for transfer to inpatient psychiatric unit.  Due to medical complexity and barriers noted above, patient currently to stay of 38 days.  Patient has now denied suicidality throughout this 5-week length of stay, and will begin safety planning to ensure a safe disposition upon his return home.   Patient and his family, are open to discharging home with his sister who can provide all of his medical needs, until patient is psychiatrically stable, medically stable to resume independent care.  Will begin referral and intake process for partial hospitalization program versus intensive outpatient program.  Patient does not appear acutely manic on exam and he does not elicit or endorse any current psychotic symptoms. Patient currently denies SI/HI/AVH.  Recommend outpatient psychiatric programming for crisis stabilization to address symptoms of depression, anxiety, and suicidal ideations. He reports not having any suicidal thoughts since he attempted. He continues to deny suicidal ideations at this time. Patient agrees to voluntary enrollment in outpatient services at this time.  He denies ongoing depression symptoms, yet remains hopeful to go home for the holidays.  He continues to show remorse and regret for his suicide attempt.  Psychiatric consult service will continue to monitor at this time.   On interview today, patient remains appropriate, calm, coherent.  He remains compliant on Zoloft 125 mg p.o. daily.  We did review 3 current outpatient resources and [place] to include [place], [place].  [employer] does offer outpatient services in both [place] and [place], which will be ideal for patient as his current disposition plan is temporarily until he is stabilized and medical/psychiatric needs have been met.  There does appear to be concerned about insurance coverage and benefits, and a NPI number has been provided to family and social work, to begin prior authorization process.    HPI: Currently on interview, the patient. Alert and oriented x4. Patient reports presenting to the emergency department after a suicide attempt via after taking multiple pills  in an attempt to end his life. Over the past couple months he reports worsening  depression and anxiety. He reports symptoms of depression including hopelessness, guilt/worthlessness, disturbed sleep pattern (sleeping 3 to 4 hours during the day) loss of interest, low energy, decreased appetite, isolative, crying spells deterioration of ADLs and recurrent thoughts of death.  He reports that his symptoms have increased due to multiple stomach surgeries, deconditioning, and increase in pain. Patient is on TPN to improve nutrition, assist with weight gain. He is also on a dysphagia 3 diet at this time, with no assistance required to feed himself. Patient has met his medically goals and now medically stable for transfer to inpatient psychiatric unit.   Past Psychiatric History: Depression  Risk to Self:   yes suicide attempt Risk to Others:   Denies Prior Inpatient Therapy:   Denies Prior Outpatient Therapy:   Denies  Past Medical History:  Past Medical History:  Diagnosis Date   Acute prostatitis 07/24/2007   Qualifier: Diagnosis of  By: [name] MD, [name]    Allergy    mild   Arthritis    osteoarthritis   Asthma    Blood transfusion without reported diagnosis    BPH (benign prostatic hypertrophy) with urinary obstruction    Crohn's ileitis ([place]) suspected 05/03/2017   Dilated aortic root ([place])    noted on echo 08/2012   Diverticulitis of colon    EPIDIDYMITIS 02/15/2010   Qualifier: Diagnosis of  By: [name] MD, [name]    GERD (gastroesophageal reflux disease)    H/O: GI bleed    Hemorrhoids    Hepatitis 1975   unknown type    HERPES SIMPLEX INFECTION 10/14/2007   Qualifier: Diagnosis of  By: [name] MD, [name]    Hiatal hernia    Ileus following gastrointestinal surgery ([place]) 12/26/2011   Long term (current) use of systemic steroids 06/18/2018   Psoriasis    sees [name] at [facility].   Recurrent ventral incisional hernia 05/10/2012   SVT (supraventricular tachycardia)    Ulcer 08/21/2013   ileal    Past Surgical History:  Procedure Laterality Date    BOWEL RESECTION  12/19/2011   Procedure: SMALL BOWEL RESECTION;  Surgeon: [name], MD;  Location: [facility];  Service: General;  Laterality: N/A;  with anastamosis and insertion mesh   BRONCHOSCOPY     COLON SURGERY  01/2004   x 2   COLONOSCOPY W/ BIOPSIES  04/26/2017   per [name], no polyps, benign inflammation, repeat in 5 yrs    CYSTOSCOPY     ESOPHAGOGASTRODUODENOSCOPY     HEMICOLECTOMY     left side, at [facility], diverticulitis   [place]     [phone] incisional hernia   ILEOSTOMY     ILEOSTOMY CLOSURE     INSERTION OF MESH  07/31/2012   Procedure: INSERTION OF MESH;  Surgeon: [name], MD;  Location: [facility];  Service: General;;   LAPAROTOMY  12/19/2011   Procedure: EXPLORATORY LAPAROTOMY;  Surgeon: [name], MD;  Location: [facility];  Service: General;  Laterality: N/A;   PACEMAKER IMPLANT N/A 12/01/2020   Procedure: PACEMAKER IMPLANT;  Surgeon: [name], MD;  Location: [place] [facility];  Service: Cardiovascular;  Laterality: N/A;   PACEMAKER INSERTION Left    TONSILLECTOMY     UPPER GASTROINTESTINAL ENDOSCOPY     VASECTOMY     VENTRAL HERNIA REPAIR  07/31/2012  Procedure: HERNIA REPAIR VENTRAL ADULT;  Surgeon: [name], MD;  Location: [facility];  Service: General;  Laterality: N/A;   Family History:  Family History  Problem Relation Age of Onset   Lung cancer Mother    Leukemia Father    Hypertension Father    Heart disease Father    Heart attack Father    Prostate cancer Father    Prostate cancer Paternal Uncle    Colon cancer Neg Hx    Stomach cancer Neg Hx    Colon polyps Neg Hx    Esophageal cancer Neg Hx    Rectal cancer Neg Hx    Family Psychiatric  History: none reported  Social History:  Social History   Substance and Sexual Activity  Alcohol Use Not Currently   Comment: occ     Social History   Substance and Sexual Activity  Drug Use Yes   Types: Oxycodone     Social History   Socioeconomic History   Marital status: Married    Spouse name: Not on file   Number of children: 2   Years of education: Not on file   Highest education level: Not on file  Occupational History   Occupation: EHS Freight forwarder    Employer: [employer]  Tobacco Use   Smoking status: Never   Smokeless tobacco: Never  Vaping Use   Vaping Use: Never used  Substance and Sexual Activity   Alcohol use: Not Currently    Comment: occ   Drug use: Yes    Types: Oxycodone   Sexual activity: Not on file  Other Topics Concern   Not on file  Social History Narrative   He is married with 2 sons 1 son is an Art gallery manager and 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   He is Nurse, 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   Rare if any caffeine   Rare alcohol and never smoker   Social Determinants of Radio broadcast assistant Strain: Low Risk  (04/10/2022)   Overall Financial Resource Strain (CARDIA)    Difficulty of Paying Living Expenses: Not hard at all  Food Insecurity: No Food Insecurity (05/14/2022)   Hunger Vital Sign    Worried About Running Out of Food in the Last Year: Never true    Ran Out of Food in the Last Year: Never true  Transportation Needs: No Transportation Needs (05/14/2022)   PRAPARE - Hydrologist (Medical): No    Lack of Transportation (Non-Medical): No  Physical Activity: Inactive (04/10/2022)   Exercise Vital Sign    Days of Exercise per Week: 0 days    Minutes of Exercise per Session: 0 min  Stress: Stress Concern Present (04/10/2022)   [place]    Feeling of Stress : To some extent  Social Connections: Not on file   Additional Social History:    Allergies:   Allergies  Allergen Reactions   Purinethol [Mercaptopurine] Other (See Comments)     Pancreatitis    Shellfish Allergy Anaphylaxis and Swelling    Can use standard SMOF lipid formulation for TPN without any issue.    Humira [Adalimumab] Other (See Comments)    Developed antibodies   Tape Rash   Wound Dressing Adhesive Rash    Labs:  Results for orders placed or performed during the  hospital encounter of 05/13/22 (from the past 48 hour(s))  Glucose, capillary     Status: Abnormal   Collection Time: 06/17/22  8:25 PM  Result Value Ref Range   Glucose-Capillary 118 (H) 70 - 99 mg/dL    Comment: Glucose reference range applies only to samples taken after fasting for at least 8 hours.  Comprehensive metabolic panel     Status: Abnormal   Collection Time: 06/18/22  2:20 AM  Result Value Ref Range   Sodium 140 135 - 145 mmol/L   Potassium 3.8 3.5 - 5.1 mmol/L   Chloride 110 98 - 111 mmol/L   CO2 25 22 - 32 mmol/L   Glucose, Bld 110 (H) 70 - 99 mg/dL    Comment: Glucose reference range applies only to samples taken after fasting for at least 8 hours.   BUN 31 (H) 8 - 23 mg/dL   Creatinine, Ser 0.90 0.61 - 1.24 mg/dL   Calcium 8.1 (L) 8.9 - 10.3 mg/dL   Total Protein 5.9 (L) 6.5 - 8.1 g/dL   Albumin 2.6 (L) 3.5 - 5.0 g/dL   AST 38 15 - 41 U/L   ALT 7 0 - 44 U/L   Alkaline Phosphatase 107 38 - 126 U/L   Total Bilirubin 0.6 0.3 - 1.2 mg/dL   GFR, Estimated >60 >60 mL/min    Comment: (NOTE) Calculated using the CKD-EPI Creatinine Equation (2021)    Anion gap 5 5 - 15    Comment: Performed at [facility], [place] [address] [place], [place] [address]  Glucose, capillary     Status: Abnormal   Collection Time: 06/18/22  7:51 AM  Result Value Ref Range   Glucose-Capillary 109 (H) 70 - 99 mg/dL    Comment: Glucose reference range applies only to samples taken after fasting for at least 8 hours.  Glucose, capillary     Status: Abnormal   Collection Time: 06/18/22  8:23 PM  Result Value Ref Range   Glucose-Capillary 121 (H) 70 - 99 mg/dL     Comment: Glucose reference range applies only to samples taken after fasting for at least 8 hours.  Magnesium     Status: None   Collection Time: 06/19/22  2:05 AM  Result Value Ref Range   Magnesium 2.0 1.7 - 2.4 mg/dL    Comment: Performed at [facility], [place] [address], [place], [place] [address]  Phosphorus     Status: None   Collection Time: 06/19/22  2:05 AM  Result Value Ref Range   Phosphorus 3.7 2.5 - 4.6 mg/dL    Comment: Performed at [facility], [place] [address] [place], [place] [address]  Comprehensive metabolic panel     Status: Abnormal   Collection Time: 06/19/22  2:05 AM  Result Value Ref Range   Sodium 140 135 - 145 mmol/L   Potassium 3.9 3.5 - 5.1 mmol/L   Chloride 109 98 - 111 mmol/L   CO2 27 22 - 32 mmol/L   Glucose, Bld 92 70 - 99 mg/dL    Comment: Glucose reference range applies only to samples taken after fasting for at least 8 hours.   BUN 28 (H) 8 - 23 mg/dL   Creatinine, Ser 0.94 0.61 - 1.24 mg/dL   Calcium 8.3 (L) 8.9 - 10.3 mg/dL   Total Protein 6.0 (L) 6.5 - 8.1 g/dL   Albumin 2.6 (L) 3.5 - 5.0 g/dL   AST 42 (H) 15 - 41 U/L  ALT 37 0 - 44 U/L   Alkaline Phosphatase 103 38 - 126 U/L   Total Bilirubin 0.3 0.3 - 1.2 mg/dL   GFR, Estimated >60 >60 mL/min    Comment: (NOTE) Calculated using the CKD-EPI Creatinine Equation (2021)    Anion gap 4 (L) 5 - 15    Comment: Performed at [facility], [place] [address] [place], [place] [address]  Triglycerides     Status: None   Collection Time: 06/19/22  2:05 AM  Result Value Ref Range   Triglycerides 84 <150 mg/dL    Comment: Performed at [facility], [place] [address] [place], [place] [address]  Glucose, capillary     Status: Abnormal   Collection Time: 06/19/22  8:46 AM  Result Value Ref Range   Glucose-Capillary 106 (H) 70 - 99 mg/dL    Comment: Glucose reference range applies only to samples taken after fasting for at least 8  hours.    Current Facility-Administered Medications  Medication Dose Route Frequency Provider Last Rate Last Admin   0.9 %  sodium chloride infusion   Intravenous PRN Lavina Hamman, MD   Stopped at 06/17/22 0505   acetaminophen (TYLENOL) tablet 650 mg  650 mg Oral Q6H PRN Tawni Millers, MD   650 mg at 06/17/22 1824   benzonatate (TESSALON) capsule 100 mg  100 mg Oral TID PRN Eulogio Bear U, DO   100 mg at 06/17/22 0203   busPIRone (BUSPAR) tablet 5 mg  5 mg Oral BID Eugenie Filler, MD   5 mg at 06/19/22 2774   Chlorhexidine Gluconate Cloth 2 % PADS 6 each  6 each Topical Daily Cherene Altes, MD   6 each at 06/18/22 0836   cyanocobalamin (VITAMIN B12) tablet 1,000 mcg  1,000 mcg Oral Daily Lavina Hamman, MD   1,000 mcg at 06/19/22 0948   enoxaparin (LOVENOX) injection 40 mg  40 mg Subcutaneous Q24H Joette Catching T, MD   40 mg at 06/15/22 2040   feeding supplement (ENSURE ENLIVE / ENSURE PLUS) liquid 237 mL  237 mL Oral Q24H Tawni Millers, MD   237 mL at 06/18/22 1458   finasteride (PROSCAR) tablet 5 mg  5 mg Oral Daily Lavina Hamman, MD   5 mg at 06/19/22 0948   gabapentin (NEURONTIN) capsule 100 mg  100 mg Oral BID Lavina Hamman, MD   100 mg at 06/19/22 1287   hydrOXYzine (ATARAX) tablet 25 mg  25 mg Oral TID PRN Tawni Millers, MD   25 mg at 06/18/22 1835   hyoscyamine (LEVSIN SL) SL tablet 0.125 mg  0.125 mg Sublingual Q6H PRN Lavina Hamman, MD       insulin aspart (novoLOG) injection 0-9 Units  0-9 Units Subcutaneous 2 times per day Emiliano Dyer, [facility]   1 Units at 06/18/22 2142   lidocaine (LIDODERM) 5 % 1 patch  1 patch Transdermal Q24H Cristal Generous, NP   1 patch at 06/18/22 1837   methocarbamol (ROBAXIN) tablet 500 mg  500 mg Oral TID Lavina Hamman, MD   500 mg at 06/19/22 0949   morphine (MSIR) tablet 15 mg  15 mg Oral Q3H PRN Lavina Hamman, MD   15 mg at 06/18/22 2143   ondansetron (ZOFRAN) injection 4 mg  4 mg Intravenous  Q6H PRN Lavina Hamman, MD   4 mg at 06/19/22 0956   Or   ondansetron (ZOFRAN) tablet 4  mg  4 mg Oral Q6H PRN Lavina Hamman, MD   4 mg at 06/17/22 6387   Oral care mouth rinse  15 mL Mouth Rinse PRN Candee Furbish, MD   15 mL at 06/09/22 0805   pantoprazole (PROTONIX) EC tablet 40 mg  40 mg Oral BID AC Lavina Hamman, MD   40 mg at 06/19/22 0948   psyllium (HYDROCIL/METAMUCIL) 1 packet  1 packet Oral Daily PRN Lavina Hamman, MD       sertraline (ZOLOFT) tablet 125 mg  125 mg Oral Daily Suella Broad, FNP   125 mg at 06/18/22 1458   simethicone (MYLICON) chewable tablet 80 mg  80 mg Oral TID WC & HS Eugenie Filler, MD   80 mg at 06/19/22 1156   sodium chloride flush (NS) 0.9 % injection 10-40 mL  10-40 mL Intracatheter PRN Cherene Altes, MD   20 mL at 06/12/22 1748   tamsulosin (FLOMAX) capsule 0.4 mg  0.4 mg Oral QPC supper Eugenie Filler, MD   0.4 mg at 56/43/32 9518   TPN CYCLIC-ADULT (ION)   Intravenous Cyclic-See Admin Instructions Lynelle Doctor, RPH        Musculoskeletal: Strength & Muscle Tone: Laying in bed   Gait & Station: Laying in bed   Patient leans: Laying in bed      Psychiatric Specialty Exam: Psychiatric Specialty Exam: Physical Exam Vitals and nursing note reviewed.  Constitutional:      Appearance: Normal appearance. He is normal weight.  Skin:    General: Skin is warm and dry.     Capillary Refill: Capillary refill takes less than 2 seconds.  Neurological:     General: No focal deficit present.     Mental Status: He is alert and oriented to person, place, and time. Mental status is at baseline.  Psychiatric:        Attention and Perception: Attention and perception normal.        Mood and Affect: Affect normal. Mood is anxious and depressed.        Speech: Speech normal.        Behavior: Behavior normal. Behavior is cooperative.        Thought Content: Thought content normal.        Cognition and Memory: Cognition normal.         Judgment: Judgment normal.     Review of Systems  Psychiatric/Behavioral:  Positive for depression. Negative for hallucinations, substance abuse and suicidal ideas (denies). The patient is nervous/anxious. The patient does not have insomnia.   All other systems reviewed and are negative.   Blood pressure 116/65, pulse 72, temperature (!) 97.5 F (36.4 C), resp. rate 17, height 5' 10" (1.778 m), weight 70.9 kg, SpO2 97 %.Body mass index is 22.41 kg/m.  General Appearance: Fairly Groomed  Eye Contact:  Good  Speech:  Clear and Coherent and Normal Rate  Volume:  Normal  Mood:  Anxious and Depressed  Affect:  Appropriate and Congruent  Thought Process:  Coherent, Goal Directed, Linear, and Descriptions of Associations: Intact  Orientation:  Full (Time, Place, and Person)  Thought Content:  Logical  Suicidal Thoughts:  No  Homicidal Thoughts:  No  Memory:  Immediate;   Fair Recent;   Fair  Judgement:  Fair  Insight:  Fair  Psychomotor Activity:  Normal  Concentration:  Concentration: Fair and Attention Span: Fair  Recall:  [employer] of Knowledge:  Good  Language:  Good  Akathisia:  No  Handed:  Right  AIMS (if indicated):     Assets:  Communication Skills Desire for Improvement Financial Resources/Insurance Leisure Time Resilience Social Support  ADL's:  Intact  Cognition:  WNL  Sleep:         Physical Exam: Physical Exam Vitals and nursing note reviewed.  Constitutional:      Appearance: Normal appearance. He is normal weight.  Skin:    General: Skin is warm and dry.     Capillary Refill: Capillary refill takes less than 2 seconds.  Neurological:     General: No focal deficit present.     Mental Status: He is alert and oriented to person, place, and time. Mental status is at baseline.  Psychiatric:        Attention and Perception: Attention and perception normal.        Mood and Affect: Affect normal. Mood is anxious and depressed.        Speech: Speech  normal.        Behavior: Behavior normal. Behavior is cooperative.        Thought Content: Thought content normal.        Cognition and Memory: Cognition normal.        Judgment: Judgment normal.    Review of Systems  Psychiatric/Behavioral:  Positive for depression. Negative for hallucinations, substance abuse and suicidal ideas (denies). The patient is nervous/anxious. The patient does not have insomnia.   All other systems reviewed and are negative.   Blood pressure 116/65, pulse 72, temperature (!) 97.5 F (36.4 C), resp. rate 17, height 5' 10" (1.778 m), weight 70.9 kg, SpO2 97 %. Body mass index is 22.41 kg/m.  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  Patient is open to either option, while PHP will be more suitable to prevent further readmission into the hospital. -Continue sertraline 125 mg p.o. daily.   Disposition: Recommend psychiatric Inpatient admission when medically cleared.  Suella Broad, FNP 06/19/2022 2:25 PM

## 2022-06-19 NOTE — Progress Notes (Signed)
Mobility Specialist - Progress Note   06/19/22 1145  Mobility  Activity Ambulated independently in hallway  Level of Assistance Independent  Assistive Device None  Distance Ambulated (ft) 200 ft  Activity Response Tolerated well  Mobility Referral Yes  $Mobility charge 1 Mobility   Pt received in bed and agreeable to mobility. No complaints during mobility. Pt to bed after session with all needs met & NT in room.    [facility]

## 2022-06-19 NOTE — Progress Notes (Signed)
Nutrition Follow-up  DOCUMENTATION CODES:   Severe malnutrition in context of chronic illness  INTERVENTION:  - Continue current cyclic TPN, meeting estimated needs: Cyclic TPN with1 hour taper up/down for total x14 hour run time:  138g protein (2g/kg), 322g dextrose (GIR 2.9-5.83mg/kg/min), 576 lipid calories (26% calories) and 2223kcal (31kcal/kg).   - Continue DYS 3 per SLP recommendations. Low fiber food choices recommended.  - Ensure Enlive po once daily, provides 350 kcal and 20 grams of protein. - Vitamin B12 supplementation per MD. - Monitor weight trends closely. Recommend daily weights while on TPN.    NUTRITION DIAGNOSIS:   Severe Malnutrition related to chronic illness as evidenced by percent weight loss, severe fat depletion, severe muscle depletion. *ongoing  GOAL:   Patient will meet greater than or equal to 90% of their needs *being met with TPN  MONITOR:   Diet advancement, Labs, Other (Comment) (TPN)  REASON FOR ASSESSMENT:   Consult New TPN/TNA  ASSESSMENT:   Pt is a 71yo M with PMH of Crohn's disease resulting in a complicated surgical history followed at [facility] to include left hemicolectomy and ileostomy with multiple other complications to include TPN dependence, chronic prostatitis, SVT, and asthma who presents with generalized weakness, acute worsening of his chronic abdominal pain and AMS.  Patient reports tolerating diet well. He is documented to be eating 3 meals a day with good intake, average of 58% over the past 3 days. This is improved from last 3 day average of 28%. He reports he is still drinking his Ensure daily.   Meal Intakes: 12/1 75% breakfast         50% lunch         75% dinner 12/2 30% breakfast         30% lunch 12/3 50% breakfast         100% lunch 12/4 50% breakfast  Patient remains on goal cyclic TPN. Labs WNL.  Per review of weights, appears patient has had desirable weight gain over the past week. Patient pleased  with weight gain as he has weight gain goals for surgery candidacy. No questions or concerns.    Date/Time Weight Weight in lbs  06/18/22 1301 70.9 kg 156.2 lbs  06/17/22 1207 70.4 kg 155.2 lbs  06/12/22 1456 69.6 kg 153.44 lbs  06/09/22 1006 67.7 kg 149.25 lbs  06/06/22 1402 70.6 kg 155.65 lbs  05/26/22 1215 68.1 kg 150.1 lbs  05/15/22 1727 64.7 kg 142.64 lbs  05/15/22 1400 66.3 kg 146.17 lbs  05/13/22 18:32:53 67.2 kg 148.15 lbs    Medications reviewed and include: Vitamin B12, Insulin  Labs reviewed:  -   Diet Order:   Diet Order             DIET DYS 3 Room service appropriate? Yes; Fluid consistency: Nectar Thick  Diet effective now                   EDUCATION NEEDS:  Education needs have been addressed  Skin:  Skin Assessment: Reviewed RN Assessment Skin Integrity Issues:: Other (Comment) Other: incontinence associated dermatitis to mid sacrum  Last BM:  12/4 - ileostomy  Height:  Ht Readings from Last 1 Encounters:  05/26/22 _0  (1.778 m)   Weight:  Wt Readings from Last 1 Encounters:  06/18/22 70.9 kg    BMI:  Body mass index is 22.41 kg/m.  Estimated Nutritional Needs:  Kcal:  2015-2350kcal Protein:  100-135g Fluid:  2454m (Holliday Segar)    [employer]  Marcello Moores RD, LDN For contact information, refer to [facility].

## 2022-06-19 NOTE — Consult Note (Addendum)
[place] Nurse ostomy consult note Stoma type/location: RLQ high output ileostomy. Pouch is intact, but has been placed with a horizontal positioning. We will change today for reorientation, stoma and peristomal skin assessment as no WOC nurse has seen in quite some time (several months). According to Bedside RN seeking consult today, patient may be approaching discharge and possibly today. Patient and son are not aware of discharge. Patient and son report patient is to see the surgical team next week at [facility] for an evaluation and consideration for reanastomosis surgery. Stomal assessment/size: 1 and 1/8 inch loop ileostomy, flush with skin, numerous creases and wrinkles in the stomal plane Peristomal assessment: irritant contact dermatitis related to stomal effluent  ICD-10 CM Codes for Irritant Dermatitis  L24A9 - Due to friction or contact with other specified body fluids  L24B3 - Related to fecal or urinary stoma or fistula  Treatment options for stomal/peristomal skin: dusting with stoma powder and blotting with liquid barrier film x 2 (crusting), skin barrier ring, compressible convex pouching system. Output: thin yellow liquid effluent with stool particles Ostomy pouching: 1pc.compressible convex with skin barrier ring and belt Education provided:  Patient's son in room to assist with pouch change and records session for his aunt's use as the aunt is the person with whom the patient is expected to live at time of discharge.  Rationale for turning pouch in the vertical direction is provided as it related to capacity and the ability to wear a belt. Rationale for belt use is provided.  Enrolled patient in [place] program: Yes, previously  [place] nursing team will not follow, but will remain available to this patient, the nursing and medical teams.  Please re-consult if needed.  Supplies at bedside: 1 belt, stoma powder, skin barrier wipes, scissors, 4 pouches  (compressible convex), 6 skin barrier rings, and a stoma measuring guide.  Thank you for inviting [place] to participate in this patient's Plan of Care.  Maudie Flakes, MSN, RN, CNS, [place], Serita Grammes, [employer], [employer] phone:  [phone]

## 2022-06-19 NOTE — TOC Progression Note (Signed)
Transition of Care [facility]) - Progression Note    Patient Details  Name: [name] MRN: [id number] Date of Birth: 01/29/1951  Transition [facility]) CM/SW Contact  Lennart Pall, LCSW Phone Number: 06/19/2022, 2:32 PM  Clinical Narrative:    Met with pt and son today to review updated dc plans and dc planning needs.  Both reporting the plan now is for pt to dc to sister's home in [place] where family can provide 24/7 support.  Psychiatry service is working to secure behavioral health follow up plans and anticipating IOP in the [place] area.   I have reached out to [facility] [name] @ [phone]) who is the agency providing TPN supplies to pt PTA.  They confirm they can provide supplies to the sister's address.  Have submitted requested chart documentation for [facility] to begin getting insurance coverage for needed supplies.  Hoping to have all arrangements in place this week.   Expected Discharge Plan:  (TBD) Barriers to Discharge: Continued Medical Work up  Expected Discharge Plan and Services Expected Discharge Plan:  (TBD)   Discharge Planning Services: CM Consult Post Acute Care Choice: [facility] Living arrangements for the past 2 months: Single Family Home                                       Social Determinants of Health (SDOH) Interventions    Readmission Risk Interventions    05/15/2022   10:53 AM  Readmission Risk Prevention Plan  Transportation Screening Complete  Medication Review (RN Care Manager) Complete  PCP or Specialist appointment within 3-5 days of discharge Complete  HRI or Home Care Consult Complete  SW Recovery Care/Counseling Consult Complete  [place] Complete

## 2022-06-19 NOTE — Plan of Care (Signed)
Problem: Education: Goal: Knowledge of General Education information will improve Description: Including pain rating scale, medication(s)/side effects and non-pharmacologic comfort measures Outcome: Progressing   Problem: Health Behavior/Discharge Planning: Goal: Ability to manage health-related needs will improve Outcome: Progressing   Problem: Clinical Measurements: Goal: Ability to maintain clinical measurements within normal limits will improve Outcome: Progressing   Problem: Activity: Goal: Risk for activity intolerance will decrease Outcome: [place], RN 06/19/22 4:34 PM

## 2022-06-20 ENCOUNTER — Ambulatory Visit (INDEPENDENT_AMBULATORY_CARE_PROVIDER_SITE_OTHER): Payer: Medicare Other

## 2022-06-20 DIAGNOSIS — G9341 Metabolic encephalopathy: Secondary | ICD-10-CM | POA: Diagnosis not present

## 2022-06-20 DIAGNOSIS — I495 Sick sinus syndrome: Secondary | ICD-10-CM

## 2022-06-20 LAB — COMPREHENSIVE METABOLIC PANEL
ALT: 34 U/L (ref 0–44)
AST: 35 U/L (ref 15–41)
Albumin: 2.7 g/dL — ABNORMAL LOW (ref 3.5–5.0)
Alkaline Phosphatase: 95 U/L (ref 38–126)
Anion gap: 5 (ref 5–15)
BUN: 28 mg/dL — ABNORMAL HIGH (ref 8–23)
CO2: 26 mmol/L (ref 22–32)
Calcium: 8.3 mg/dL — ABNORMAL LOW (ref 8.9–10.3)
Chloride: 107 mmol/L (ref 98–111)
Creatinine, Ser: 0.91 mg/dL (ref 0.61–1.24)
GFR, Estimated: >60 mL/min (ref >60)
Glucose, Bld: 116 mg/dL — ABNORMAL HIGH (ref 70–99)
Potassium: 3.6 mmol/L (ref 3.5–5.1)
Sodium: 138 mmol/L (ref 135–145)
Total Bilirubin: 0.4 mg/dL (ref 0.3–1.2)
Total Protein: 6.5 g/dL (ref 6.5–8.1)

## 2022-06-20 LAB — GLUCOSE, CAPILLARY
Glucose-Capillary: 108 mg/dL — ABNORMAL HIGH (ref 70–99)
Glucose-Capillary: 95 mg/dL (ref 70–99)

## 2022-06-20 MED ORDER — TRAVASOL 10 % IV SOLN
INTRAVENOUS | Status: AC
  Filled 2022-06-20: qty 1334

## 2022-06-20 NOTE — Progress Notes (Signed)
Mobility Specialist - Progress Note   06/20/22 1455  Mobility  Activity Ambulated independently in hallway  Level of Assistance Independent  Assistive Device None  Distance Ambulated (ft) 240 ft  Activity Response Tolerated well  Mobility Referral Yes  $Mobility charge 1 Mobility   Pt received in bed and agreeable to mobility. No complaints during mobility session. Pt to room standing after session with all needs met & wife in room.  [facility]

## 2022-06-20 NOTE — Consult Note (Signed)
Face-to-Face Psychiatry Consult   Reason for Consult:  Suicide Attempt  Referring Physician:  Red Christians   Patient Identification: [name] MRN:  [id number] Principal Diagnosis: Metabolic encephalopathy Diagnosis:  Principal Problem:   Metabolic encephalopathy Active Problems:   Chronic anemia   Acute respiratory failure with hypoxia (HCC)   Severe malnutrition (HCC)   Generalized weakness   Drug overdose, intentional, initial encounter ([place])   Chronic pain syndrome   Aspiration pneumonia ([place])  Total Time spent with patient: 20 minutes  Subjective:   71yo with hx Crohn's with persistent abd pain s/p extensive surgeries and is currently TPN dependent. Pt presented with unresponsiveness in apparent suicide attempt via overdose.    Patient seen and assessed in his room, lying upright with his wife at bedside.  Patient was very welcoming, engaging and interactive today.  Provider did follow up with outpatient options to include eliminating [facility] as they are not covered by his insurance.  Emphasis will now be placed on [facility] and [facility].  Patient did request brochures for [facility], which was provided to patient and wife by this nurse practitioner.  Patient continues to report compliance with medication, currently taking Zoloft 125 mg p.o. daily.  Patient does report improvement in mood, describing it as "better and I am doing okay."  Patient currently has no concerns.  This nurse practitioner did schedule a phone assessment with [facility] to determine appropriate level of care (PHP versus IOP).  This information has been relayed to patient and wife, for Thursday, December 7 at 8 AM, via use of patient's cell phone.  Inpatient assessment will still be required however appointment for and person intake will be provided upon completion of phone assessment.  Patient profile was completed with [facility].   Considering patient's improvement in mood,  interaction and engagement with team and family; compliance with medication, and recent safety risk assessment.  Will discontinue suicide sitter.    HPI: Currently on interview, the patient. Alert and oriented x4. Patient reports presenting to the emergency department after a suicide attempt via after taking multiple pills in an attempt to end his life. Over the past couple months he reports worsening depression and anxiety. He reports symptoms of depression including hopelessness, guilt/worthlessness, disturbed sleep pattern (sleeping 3 to 4 hours during the day) loss of interest, low energy, decreased appetite, isolative, crying spells deterioration of ADLs and recurrent thoughts of death.  He reports that his symptoms have increased due to multiple stomach surgeries, deconditioning, and increase in pain. Patient is on TPN to improve nutrition, assist with weight gain. He is also on a dysphagia 3 diet at this time, with no assistance required to feed himself. Patient has met his medically goals and now medically stable for transfer to inpatient psychiatric unit.   Past Psychiatric History: Depression  Risk to Self:   yes suicide attempt Risk to Others:   Denies Prior Inpatient Therapy:   Denies Prior Outpatient Therapy:   Denies  Past Medical History:  Past Medical History:  Diagnosis Date   Acute prostatitis 07/24/2007   Qualifier: Diagnosis of  By: [name] MD, [name]    Allergy    mild   Arthritis    osteoarthritis   Asthma    Blood transfusion without reported diagnosis    BPH (benign prostatic hypertrophy) with urinary obstruction    Crohn's ileitis ([place]) suspected 05/03/2017   Dilated aortic root ([place])    noted on echo 08/2012  Diverticulitis of colon    EPIDIDYMITIS 02/15/2010   Qualifier: Diagnosis of  By: [name] MD, [name]    GERD (gastroesophageal reflux disease)    H/O: GI bleed    Hemorrhoids    Hepatitis 1975   unknown type    HERPES SIMPLEX INFECTION 10/14/2007    Qualifier: Diagnosis of  By: [name] MD, [name]    Hiatal hernia    Ileus following gastrointestinal surgery ([place]) 12/26/2011   Long term (current) use of systemic steroids 06/18/2018   Psoriasis    [name]   Recurrent ventral incisional hernia 05/10/2012   SVT (supraventricular tachycardia)    Ulcer 08/21/2013   ileal    Past Surgical History:  Procedure Laterality Date   BOWEL RESECTION  12/19/2011   Procedure: SMALL BOWEL RESECTION;  Surgeon: [name], MD;  Location: [facility];  Service: General;  Laterality: N/A;  with anastamosis and insertion mesh   BRONCHOSCOPY     COLON SURGERY  01/2004   x 2   COLONOSCOPY W/ BIOPSIES  04/26/2017   per [name], no polyps, benign inflammation, repeat in 5 yrs    CYSTOSCOPY     ESOPHAGOGASTRODUODENOSCOPY     HEMICOLECTOMY     left side, at [facility], diverticulitis   [place]     [phone] incisional hernia   ILEOSTOMY     ILEOSTOMY CLOSURE     INSERTION OF MESH  07/31/2012   Procedure: INSERTION OF MESH;  Surgeon: [name], MD;  Location: [facility];  Service: General;;   LAPAROTOMY  12/19/2011   Procedure: EXPLORATORY LAPAROTOMY;  Surgeon: [name], MD;  Location: [facility];  Service: General;  Laterality: N/A;   PACEMAKER IMPLANT N/A 12/01/2020   Procedure: PACEMAKER IMPLANT;  Surgeon: [name], MD;  Location: [place] CV LAB;  Service: Cardiovascular;  Laterality: N/A;   PACEMAKER INSERTION Left    TONSILLECTOMY     UPPER GASTROINTESTINAL ENDOSCOPY     VASECTOMY     VENTRAL HERNIA REPAIR  07/31/2012   Procedure: HERNIA REPAIR VENTRAL ADULT;  Surgeon: [name], MD;  Location: [facility];  Service: General;  Laterality: N/A;   Family History:  Family History  Problem Relation Age of Onset   Lung cancer Mother    Leukemia Father    Hypertension Father    Heart disease Father    Heart attack Father    Prostate cancer Father     Prostate cancer Paternal Uncle    Colon cancer Neg Hx    Stomach cancer Neg Hx    Colon polyps Neg Hx    Esophageal cancer Neg Hx    Rectal cancer Neg Hx    Family Psychiatric  History: none reported  Social History:  Social History   Substance and Sexual Activity  Alcohol Use Not Currently   Comment: occ     Social History   Substance and Sexual Activity  Drug Use Yes   Types: Oxycodone    Social History   Socioeconomic History   Marital status: Married    Spouse name: Not on file   Number of children: 2   Years of education: Not on file   Highest education level: Not on file  Occupational History   Occupation: EHS manager    Employer: [employer]  Tobacco Use   Smoking status: Never   Smokeless tobacco: Never  Vaping Use  Vaping Use: Never used  Substance and Sexual Activity   Alcohol use: Not Currently    Comment: occ   Drug use: Yes    Types: Oxycodone   Sexual activity: Not on file  Other Topics Concern   Not on file  Social History Narrative   He is married with 2 sons 1 son is an Art gallery manager and 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   He is Nurse, 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   Rare if any caffeine   Rare alcohol and never smoker   Social Determinants of Radio broadcast assistant Strain: Low Risk  (04/10/2022)   Overall Financial Resource Strain (CARDIA)    Difficulty of Paying Living Expenses: Not hard at all  Food Insecurity: No Food Insecurity (05/14/2022)   Hunger Vital Sign    Worried About Running Out of Food in the Last Year: Never true    [place] in the Last Year: Never true  Transportation Needs: No Transportation Needs (05/14/2022)   PRAPARE - Hydrologist (Medical): No    Lack of Transportation (Non-Medical): No  Physical Activity: Inactive (04/10/2022)   Exercise Vital  Sign    Days of Exercise per Week: 0 days    Minutes of Exercise per Session: 0 min  Stress: Stress Concern Present (04/10/2022)   Index    Feeling of Stress : To some extent  Social Connections: Not on file   Additional Social History:    Allergies:   Allergies  Allergen Reactions   Purinethol [Mercaptopurine] Other (See Comments)    Pancreatitis    Shellfish Allergy Anaphylaxis and Swelling    Can use standard SMOF lipid formulation for TPN without any issue.    Humira [Adalimumab] Other (See Comments)    Developed antibodies   Tape Rash   Wound Dressing Adhesive Rash    Labs:  Results for orders placed or performed during the hospital encounter of 05/13/22 (from the past 48 hour(s))  Glucose, capillary     Status: Abnormal   Collection Time: 06/18/22  8:23 PM  Result Value Ref Range   Glucose-Capillary 121 (H) 70 - 99 mg/dL    Comment: Glucose reference range applies only to samples taken after fasting for at least 8 hours.  Magnesium     Status: None   Collection Time: 06/19/22  2:05 AM  Result Value Ref Range   Magnesium 2.0 1.7 - 2.4 mg/dL    Comment: Performed at [facility], [place] [address] [place], [place] [address]  Phosphorus     Status: None   Collection Time: 06/19/22  2:05 AM  Result Value Ref Range   Phosphorus 3.7 2.5 - 4.6 mg/dL    Comment: Performed at [facility], [place] [address] [place], [place] [address]  Comprehensive metabolic panel     Status: Abnormal   Collection Time: 06/19/22  2:05 AM  Result Value Ref Range   Sodium 140 135 - 145 mmol/L   Potassium 3.9 3.5 - 5.1 mmol/L   Chloride 109 98 - 111 mmol/L   CO2 27 22 - 32 mmol/L   Glucose, Bld 92 70 - 99 mg/dL    Comment: Glucose reference range applies only to samples taken after fasting for at least 8 hours.  BUN 28 (H) 8 - 23 mg/dL   Creatinine, Ser 0.94 0.61 - 1.24 mg/dL    Calcium 8.3 (L) 8.9 - 10.3 mg/dL   Total Protein 6.0 (L) 6.5 - 8.1 g/dL   Albumin 2.6 (L) 3.5 - 5.0 g/dL   AST 42 (H) 15 - 41 U/L   ALT 37 0 - 44 U/L   Alkaline Phosphatase 103 38 - 126 U/L   Total Bilirubin 0.3 0.3 - 1.2 mg/dL   GFR, Estimated >60 >60 mL/min    Comment: (NOTE) Calculated using the CKD-EPI Creatinine Equation (2021)    Anion gap 4 (L) 5 - 15    Comment: Performed at [facility], [place] [address] [place], [place] [address]  Triglycerides     Status: None   Collection Time: 06/19/22  2:05 AM  Result Value Ref Range   Triglycerides 84 <150 mg/dL    Comment: Performed at [facility], [place] [address], [place], [place] [address]  Glucose, capillary     Status: Abnormal   Collection Time: 06/19/22  8:46 AM  Result Value Ref Range   Glucose-Capillary 106 (H) 70 - 99 mg/dL    Comment: Glucose reference range applies only to samples taken after fasting for at least 8 hours.  Glucose, capillary     Status: Abnormal   Collection Time: 06/19/22  8:28 PM  Result Value Ref Range   Glucose-Capillary 111 (H) 70 - 99 mg/dL    Comment: Glucose reference range applies only to samples taken after fasting for at least 8 hours.   Comment 1 Notify RN    Comment 2 Document in Chart   Comprehensive metabolic panel     Status: Abnormal   Collection Time: 06/20/22  2:13 AM  Result Value Ref Range   Sodium 138 135 - 145 mmol/L   Potassium 3.6 3.5 - 5.1 mmol/L   Chloride 107 98 - 111 mmol/L   CO2 26 22 - 32 mmol/L   Glucose, Bld 116 (H) 70 - 99 mg/dL    Comment: Glucose reference range applies only to samples taken after fasting for at least 8 hours.   BUN 28 (H) 8 - 23 mg/dL   Creatinine, Ser 0.91 0.61 - 1.24 mg/dL   Calcium 8.3 (L) 8.9 - 10.3 mg/dL   Total Protein 6.5 6.5 - 8.1 g/dL   Albumin 2.7 (L) 3.5 - 5.0 g/dL   AST 35 15 - 41 U/L   ALT 34 0 - 44 U/L   Alkaline Phosphatase 95 38 - 126 U/L   Total Bilirubin 0.4 0.3 - 1.2 mg/dL   GFR,  Estimated >60 >60 mL/min    Comment: (NOTE) Calculated using the CKD-EPI Creatinine Equation (2021)    Anion gap 5 5 - 15    Comment: Performed at [facility], [place] [address] [place], [place] [address]  Glucose, capillary     Status: Abnormal   Collection Time: 06/20/22  8:39 AM  Result Value Ref Range   Glucose-Capillary 108 (H) 70 - 99 mg/dL    Comment: Glucose reference range applies only to samples taken after fasting for at least 8 hours.    Current Facility-Administered Medications  Medication Dose Route Frequency Provider Last Rate Last Admin   0.9 %  sodium chloride infusion   Intravenous PRN Lavina Hamman, MD   Stopped at 06/17/22 0505   acetaminophen (TYLENOL) tablet 650 mg  650 mg Oral Q6H PRN Arrien, Jimmy Picket, MD  650 mg at 06/17/22 1824   benzonatate (TESSALON) capsule 100 mg  100 mg Oral TID PRN Eulogio Bear U, DO   100 mg at 06/17/22 0203   busPIRone (BUSPAR) tablet 5 mg  5 mg Oral BID Eugenie Filler, MD   5 mg at 06/20/22 2947   Chlorhexidine Gluconate Cloth 2 % PADS 6 each  6 each Topical Daily Cherene Altes, MD   6 each at 06/19/22 1301   cyanocobalamin (VITAMIN B12) tablet 1,000 mcg  1,000 mcg Oral Daily Lavina Hamman, MD   1,000 mcg at 06/20/22 0942   enoxaparin (LOVENOX) injection 40 mg  40 mg Subcutaneous Q24H Joette Catching T, MD   40 mg at 06/15/22 2040   feeding supplement (ENSURE ENLIVE / ENSURE PLUS) liquid 237 mL  237 mL Oral Q24H Tawni Millers, MD   237 mL at 06/18/22 1458   finasteride (PROSCAR) tablet 5 mg  5 mg Oral Daily Lavina Hamman, MD   5 mg at 06/20/22 6546   gabapentin (NEURONTIN) capsule 100 mg  100 mg Oral BID Lavina Hamman, MD   100 mg at 06/20/22 5035   hydrOXYzine (ATARAX) tablet 25 mg  25 mg Oral TID PRN Tawni Millers, MD   25 mg at 06/19/22 2313   hyoscyamine (LEVSIN SL) SL tablet 0.125 mg  0.125 mg Sublingual Q6H PRN Lavina Hamman, MD   0.125 mg at 06/20/22 4656   insulin  aspart (novoLOG) injection 0-9 Units  0-9 Units Subcutaneous 2 times per day Emiliano Dyer, [facility]   1 Units at 06/18/22 2142   lidocaine (LIDODERM) 5 % 1 patch  1 patch Transdermal Q24H Cristal Generous, NP   1 patch at 06/19/22 1614   methocarbamol (ROBAXIN) tablet 500 mg  500 mg Oral TID Lavina Hamman, MD   500 mg at 06/20/22 0942   morphine (MSIR) tablet 15 mg  15 mg Oral Q3H PRN Lavina Hamman, MD   15 mg at 06/20/22 0841   ondansetron (ZOFRAN) injection 4 mg  4 mg Intravenous Q6H PRN Lavina Hamman, MD   4 mg at 06/19/22 0956   Or   ondansetron (ZOFRAN) tablet 4 mg  4 mg Oral Q6H PRN Lavina Hamman, MD   4 mg at 06/20/22 8127   Oral care mouth rinse  15 mL Mouth Rinse PRN Candee Furbish, MD   15 mL at 06/09/22 0805   pantoprazole (PROTONIX) EC tablet 40 mg  40 mg Oral BID AC Lavina Hamman, MD   40 mg at 06/20/22 0837   psyllium (HYDROCIL/METAMUCIL) 1 packet  1 packet Oral Daily PRN Lavina Hamman, MD       sertraline (ZOLOFT) tablet 125 mg  125 mg Oral Daily Suella Broad, FNP   125 mg at 06/19/22 1601   simethicone (MYLICON) chewable tablet 80 mg  80 mg Oral TID WC & HS Eugenie Filler, MD   80 mg at 06/20/22 1245   sodium chloride flush (NS) 0.9 % injection 10-40 mL  10-40 mL Intracatheter PRN Cherene Altes, MD   20 mL at 06/12/22 1748   tamsulosin (FLOMAX) capsule 0.4 mg  0.4 mg Oral QPC supper Eugenie Filler, MD   0.4 mg at 51/70/01 7494   TPN CYCLIC-ADULT (ION)   Intravenous Cyclic-See Admin Instructions Lynelle Doctor, RPH        Musculoskeletal: Strength & Muscle Tone: Laying in bed  Gait & Station: Laying in bed   Patient leans: Laying in bed    Psychiatric Specialty Exam: Psychiatric Specialty Exam: Physical Exam Vitals and nursing note reviewed.  Constitutional:      Appearance: Normal appearance. He is normal weight.  Skin:    General: Skin is warm and dry.     Capillary Refill: Capillary refill takes less than 2 seconds.  Neurological:      General: No focal deficit present.     Mental Status: He is alert and oriented to person, place, and time. Mental status is at baseline.  Psychiatric:        Attention and Perception: Attention and perception normal.        Mood and Affect: Affect normal. Mood is anxious and depressed.        Speech: Speech normal.        Behavior: Behavior normal. Behavior is cooperative.        Thought Content: Thought content normal.        Cognition and Memory: Cognition normal.        Judgment: Judgment normal.     Review of Systems  Psychiatric/Behavioral:  Positive for depression. Negative for hallucinations, substance abuse and suicidal ideas (denies). The patient is nervous/anxious. The patient does not have insomnia.   All other systems reviewed and are negative.   Blood pressure 118/62, pulse 65, temperature 97.9 F (36.6 C), temperature source Oral, resp. rate 18, height _0  (1.778 m), weight 70.9 kg, SpO2 96 %.Body mass index is 22.41 kg/m.  General Appearance: Fairly Groomed  Eye Contact:  Good  Speech:  Clear and Coherent and Normal Rate  Volume:  Normal  Mood:  Anxious and Depressed  Affect:  Appropriate and Congruent  Thought Process:  Coherent, Goal Directed, Linear, and Descriptions of Associations: Intact  Orientation:  Full (Time, Place, and Person)  Thought Content:  Logical  Suicidal Thoughts:  No  Homicidal Thoughts:  No  Memory:  Immediate;   Fair Recent;   Fair  Judgement:  Fair  Insight:  Fair  Psychomotor Activity:  Normal  Concentration:  Concentration: Fair and Attention Span: Fair  Recall:  [employer] of Knowledge:  Good  Language:  Good  Akathisia:  No  Handed:  Right  AIMS (if indicated):     Assets:  Communication Skills Desire for Improvement Financial Resources/Insurance Leisure Time Resilience Social Support  ADL's:  Intact  Cognition:  WNL  Sleep:         Physical Exam: Physical Exam Vitals and nursing note reviewed.   Constitutional:      Appearance: Normal appearance. He is normal weight.  Skin:    General: Skin is warm and dry.     Capillary Refill: Capillary refill takes less than 2 seconds.  Neurological:     General: No focal deficit present.     Mental Status: He is alert and oriented to person, place, and time. Mental status is at baseline.  Psychiatric:        Attention and Perception: Attention and perception normal.        Mood and Affect: Affect normal. Mood is anxious and depressed.        Speech: Speech normal.        Behavior: Behavior normal. Behavior is cooperative.        Thought Content: Thought content normal.        Cognition and Memory: Cognition normal.        Judgment: Judgment  normal.    Review of Systems  Psychiatric/Behavioral:  Positive for depression. Negative for hallucinations, substance abuse and suicidal ideas (denies). The patient is nervous/anxious. The patient does not have insomnia.   All other systems reviewed and are negative.   Blood pressure 118/62, pulse 65, temperature 97.9 F (36.6 C), temperature source Oral, resp. rate 18, height _0  (1.778 m), weight 70.9 kg, SpO2 96 %. Body mass index is 22.41 kg/m.  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 Has continued to deny suicidality since his attempt. Will d/c suicide sitter.  -Will begin safety planning, discharge planning, referral for partial hospitalization programming versus intensive outpatient programming.  Patient is open to either option, while PHP will be more suitable to prevent further readmission into the hospital. Phone Assessment 06/22/2022 @ [facility].  -Continue sertraline 125 mg p.o. daily.   Disposition:  Recommend psychiatric Inpatient admission when medically cleared. Suella Broad, FNP 06/20/2022 2:20 PM

## 2022-06-20 NOTE — Progress Notes (Signed)
PHARMACY - TOTAL PARENTERAL NUTRITION CONSULT NOTE   Indication: Crohn's disease and complex past surgical history including ex lap with small bowel resection, lysis of adhesion, mesh explantation, and diverting loop ileostomy in April 2023, on TPN prior to admission  Patient Measurements: Height: 5' 10"  (177.8 cm) Weight: 70.9 kg (156 lb 3.2 oz) IBW/kg (Calculated) : 73 TPN AdjBW (KG): 68.1 Body mass index is 22.41 kg/m.  Assessment: Patient with PMH of Crohn's disease and complex surgical history as above on chronic TPN who presented on 05/13/22 with altered mental status after spouse found patient unresponsive with multiple pill bottles open around him. Patient was transported from SNF to ED the day prior for generalized weakness and acute worsening of his chronic abdominal pain. Patient decided to return home instead of his SNF. Pharmacy consulted to resume TPN as inpatient.   Discharge from [facility] 04/19/22:  TPN pharmacist at [facility]:  Estimated Nutrient Requirements: Protein: 1.5 - 1.7 g/kg/day (98 - 104 g/day), Total kcal: 30 - 35 kcal/kg/day (1830 - 1983 kcal/day)  TPN Formulation at [facility] per TPN pharmacist: [A 6.6%, D 20%, F 3.1%] @ goal rate, 65 mL/hr (TV: 1560 mL/day) provides: Protein 1.7 g/kg/day 103 g/day, Total kcal 32 kcal/kg/day 1971 kcal/day  NPC: N ratio 94   Admitted at [facility] 04/29/22-05/03/22: Nutritional Goals: TPN 80 mL/hr (provides 113 g of protein and 1943 kcals per day) RD Assessment: (10/16): 2100-2450kcal (30-35kcal/kg), Protein 105-130g (1.5-1.8g/kg) Utilize intralipid rather than SMOF as 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  [facility]: [name], PharmD, ph: [phone] 10/28 Most recent formula: Total volume 1560 mL:  Dextrose 220 g/day, Plenamine 120 g/day, SMOFlipid 50 g/day, NaCl 75 mEq, CaGluc 6 mEq, Potassium Ac 20 mEq, Potassium Phos 13.5 nM, Mag Sulfate 8 mEq, Vitamins/trace  elements   GI issues: Crohn's dz with chronic TPN, aspiration, dysphagia (followed by speech), severe PCM, ileostomy leakage  - PICC line placed in September 24 with the plan to continue TPN until the patient gains 30 to 40 pounds and then consider ileostomy closure.  Glucose / Insulin: No hx DM.  - On sSSI (has not used any insulin in the past 24 hrs) -  CBGs at goal <150: wnl Electrolytes: Lytes wnl including CorrCa  Renal: SCr ok; BUN slightly elevated Hepatic: LFTs improved, Tbili WNL - Albumin low 2.6 - TG 109 (11/23), 100 (11/27), 84 (12/4) I/O: -295 mL - ileostomy output: 1275 mL GI Imaging: - 10/14 CTa/p: previous surgical changes noted (transverse-sigmoid anastomosis) with stable inflammation since 10/12 CT - 10/27 CTa/p: no significant interval change since 10/14 CT. No evidence of bowel obstruction or other complicating feature at the anastomotic site or ostomy. - 11/6 abd [place] - normal liver (checked d/t elevated LFTs) - 11/20 CT: Same postoperative changes. Inflammatory change in the right lower quadrant is not significantly changed since 05/12/2022. New patchy opacity in the right lung base. GI Surgeries / Procedures: n/a  Central access: prior to admission - double lumen CVC (03/2022, 130lbs) TPN start date: started PTA  Nutritional Goals: TPN provides 133 g of protein and 2204  kcals per day.  RD Assessment this admission:    Estimated Needs Total Energy Estimated Needs: 2015-2350kcal Total Protein Estimated Needs: 100-135g Total Fluid Estimated Needs: 2412m (Holliday Segar)  Current Nutrition:  TPN Dysphagia-3 11/9 >> Regular 11/17 >>  Dysphagia 3 11/21 >>   Plan:  Continue Cyclic TPN at 24765ml over 14  hrs Will provide 133 g AA, 26.13% lipids, 2204 kcal GIR 3-6.1 Electrolytes in TPN:  Na 75 mEq/L K 40 mEq/L Ca 5 mEq/L Mg 5 mEq/L Phos 15 mmol/L Cl:Ac = 1:1 Add standard MVI and trace elements to TPN  With excellent CBG control, will limit finger sticks  to twice daily.  Timed 2 hrs after TPN start, 1 hour after TPN stop  Monitor TPN labs on Mon/Thurs at minimum and PRN   Lynelle Doctor, PharmD, BCPS Clinical Pharmacist 06/20/2022 6:58 AM

## 2022-06-20 NOTE — Progress Notes (Signed)
[facility] Progress Note Patient: [name] EXB:[id number] DOB: 07/28/1950 DOA: 05/13/2022  DOS: the patient was seen and examined on 06/20/2022  Brief hospital course: 71yo with hx Crohn's with persistent abd pain s/p extensive surgeries and is currently TPN dependent. Pt presented with unresponsiveness in apparent suicide attempt via overdose. Pt remained unresponsive and later was unable to maintain airway, prompting intubation and transfer to ICU. Pt now extubated and care transferred to hospitalist.  Medically stable.  Requiring inpatient psychiatric placement. Now plan is to go to his sister's place in [place] with outpatient psychiatric support and home health.  Most likely on Thursday. Assessment and Plan: Intentional drug overdose. Suicide ideation. MDD. Severe anxiety disorder. Patient presented with an unresponsive event at home. Noted to have intentionally taken pain medication and anxiolytics with intention for suicide. Psychiatry was consulted. Recommended inpatient psychiatric admission. Patient currently medically stable although unable to find a place that can accommodate TPN. Referred to [facility].  Accepted. Patient reports good response to gabapentin. Now plan is to go to his sister's place with outpatient psychiatric support.  Agree with the plan for not to go his own house with wife support.   Acute toxic and metabolic encephalopathy. Likely combination of medication overdose as well as hypoxia from pneumonia. Head CT and MRI brain negative for any acute abnormality. Mentation at baseline. Monitor.   Aspiration pneumonia, brief episode of pneumonitis. Completed IV antibiotic therapy earlier for aspiration pneumonia. CT on 11/20 shows evidence of new evidence of right lower lobe infiltrate. Procalcitonin level is negative.    Dysphagia. Speech therapy following the patient. MBS was performed and patient was advanced to dysphagia 3 diet followed by advancing  to regular diet. CT chest on 11/21 shows evidence of right lower lung infiltrate. Speech therapy consulted again.  Repeat MBS on 11/22. Now back on D3 diet.  Nectar thick liquid.   Crohn's disease. Patient had multiple surgeries in the past.currently not on any medical therapy. 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 Management per pharmacy.   Severe protein calorie malnutrition. Body mass index is 21.54 kg/m.  Nutrition Problem: Severe Malnutrition Etiology: chronic illness Interventions: TPN (increase nutrients) Continue the same.   Chronic prostatitis. Bladder calculi. BPH. Seen by [name] on 10/15. Patient is on Bactrim for 28 days since then although missed some doses while he was intubated. Discussed with urology 11/15.  Completed total 14-day treatment course for Bactrim. Foley catheter placed in the ICU removed on 11/16.  Currently voiding successfully without any retention. Continue Flomax and finasteride. Patient will follow-up with [name] for outpatient procedure for BPH.   Ileostomy leakage with irritation and dermatitis.. Wound care consulted. Currently being treated with stoma powder and frequent emptying. Stoma appears healthy and pink.  No surrounding skin cellulitis seen.  Patient is following up with [name].  Patient had a PICC line placed in September 24 with the plan to continue TPN until the patient gains 30 to 40 pounds and then consider ileostomy closure. Patient was 130 pound at the placement of the PICC line. Discussed with [name] on 11/21.  See note for 11/21 for details.   Chronic pelvic pain. Patient continues to report worsening of his pelvic pain. X-ray hip and pelvis is unremarkable for any acute etiology. Patient had a CT abdomen and pelvis as well as CT chest angio abdomen and pelvis earlier last month during this hospital stay which were unremarkable. Patient is requiring IV  Dilaudid.  Patient reported to the same to psychiatry that the pain is actually causing him anxiety. Repeat CT abdomen pelvis on 06/05/2022 is negative for any acute abnormality to explain patient's pelvic pain.  Does have some chronic inflammation which is unchanged. Patient was informed that he does not require any IV narcotics as there is no acute fracture or ruptured viscus or any other acute abnormality. Patient told me that discussion with regards to transitioning from narcotics to other kind of medicine was associated with his suicidal attempt. Start gabapentin twice daily.   Transient A-fib. Resolved. Monitor.   Normocytic anemia.  Iron deficiency.  Relative vitamin B12 deficiency Baseline hemoglobin around 10.  Hemoglobin stable after initial drop. Suspect anemia is from frequent blood draws and poor p.o. intake and nutritional status. Vitamin B12 301.  Continue with replacement. Repeat iron, B12 levels adequately trending up.   Intractable nausea. Continue short course of PPI twice daily and Bentyl.  No abnormality seen on CTs.   Subjective: Reports anxiety at night.  No nausea no vomiting.  Reports intermittent abdominal cramping.  Oral intake still liquid.  Ambulating in the hallway.  Physical Exam: General: in mild distress;  Cardiovascular: S1 and S2 Present, no Murmur Respiratory: normal respiratory effort, Bilateral Air entry present, no Crackles, no wheezes Abdomen: Bowel Sound present,  Extremities: No edema Neurology: alert and oriented to time, place, and person   Data Reviewed: I have Reviewed nursing notes, Vitals, and Lab results. Since last encounter, pertinent lab results CBC and BMP   . I have discussed pt's care plan and test results with psychiatric  .   Disposition: Status is: Inpatient Remains inpatient appropriate because: Awaiting safe discharge plan most likely on Thursday.  enoxaparin (LOVENOX) injection 40 mg Start: 05/13/22 2200   Family  Communication: Wife at bedside Level of care: Med-Surg Vitals:   06/19/22 1318 06/19/22 2113 06/20/22 0630 06/20/22 1254  BP: 116/65 137/69 126/63 118/62  Pulse: 72 70 66 65  Resp: 17 16 16 18   Temp: (!) 97.5 F (36.4 C) 97.7 F (36.5 C) 97.7 F (36.5 C) 97.9 F (36.6 C)  TempSrc:  Oral Oral Oral  SpO2: 97% 97% 99% 96%  Weight:      Height:         Author: Berle Mull, MD 06/20/2022 7:26 PM  Please look on [email or web address] to find out who is on call.

## 2022-06-20 NOTE — Plan of Care (Signed)
[facility] PROGRESS NOTE  Patient: [name] YME:[id number]   PCP: [name], MD DOB: 1950-09-03   DOA: 05/13/2022   DOS: 06/20/2022    Crohn's disease, asthma, history of GI bleed, with complex past surgical history including ex lap, SBR, LOA, mesh explantation, diverting loop ileostomy in April 2023  Needs long-term TPN for next 3 months.  Ordering MD for TPN. Berle Mull MD I have seen and followed the patient this hospital stay.  Long-term MD who initiated the order for the [place], [address], [place], [place] [address]  [phone] (Work)  [phone] (Fax)  TPN started on 04/09/2022 for failure to thrive despite p.o. intake resulting in severe dehydration creatinine was 2.3 in September 2023. Please review admission and discharge summary from [facility] for details of that encounter. Next follow-up with [name] 06/28/2022. On oral feed for comfort.  Unable to meet nutritional needs orally and thus enteral feeds will not also be adequate for nutrition.  Patient has failed enteral nutrition multiple times in 2023.  Author: Berle Mull, MD [facility] 06/20/2022 9:54 AM   If 7PM-7AM, please contact night-coverage at [email or web address]

## 2022-06-20 NOTE — Plan of Care (Signed)

## 2022-06-21 DIAGNOSIS — Z8719 Personal history of other diseases of the digestive system: Secondary | ICD-10-CM

## 2022-06-21 DIAGNOSIS — Z932 Ileostomy status: Secondary | ICD-10-CM

## 2022-06-21 DIAGNOSIS — R531 Weakness: Secondary | ICD-10-CM | POA: Diagnosis not present

## 2022-06-21 DIAGNOSIS — T50902A Poisoning by unspecified drugs, medicaments and biological substances, intentional self-harm, initial encounter: Secondary | ICD-10-CM | POA: Diagnosis not present

## 2022-06-21 DIAGNOSIS — N401 Enlarged prostate with lower urinary tract symptoms: Secondary | ICD-10-CM

## 2022-06-21 DIAGNOSIS — J690 Pneumonitis due to inhalation of food and vomit: Secondary | ICD-10-CM | POA: Diagnosis not present

## 2022-06-21 DIAGNOSIS — F322 Major depressive disorder, single episode, severe without psychotic features: Secondary | ICD-10-CM

## 2022-06-21 DIAGNOSIS — G9341 Metabolic encephalopathy: Secondary | ICD-10-CM | POA: Diagnosis not present

## 2022-06-21 LAB — GLUCOSE, CAPILLARY
Glucose-Capillary: 103 mg/dL — ABNORMAL HIGH (ref 70–99)
Glucose-Capillary: 92 mg/dL (ref 70–99)

## 2022-06-21 LAB — COMPREHENSIVE METABOLIC PANEL
ALT: 34 U/L (ref 0–44)
AST: 37 U/L (ref 15–41)
Albumin: 2.4 g/dL — ABNORMAL LOW (ref 3.5–5.0)
Alkaline Phosphatase: 96 U/L (ref 38–126)
Anion gap: 4 — ABNORMAL LOW (ref 5–15)
BUN: 33 mg/dL — ABNORMAL HIGH (ref 8–23)
CO2: 27 mmol/L (ref 22–32)
Calcium: 8.2 mg/dL — ABNORMAL LOW (ref 8.9–10.3)
Chloride: 107 mmol/L (ref 98–111)
Creatinine, Ser: 0.93 mg/dL (ref 0.61–1.24)
GFR, Estimated: >60 mL/min (ref >60)
Glucose, Bld: 103 mg/dL — ABNORMAL HIGH (ref 70–99)
Potassium: 4.1 mmol/L (ref 3.5–5.1)
Sodium: 138 mmol/L (ref 135–145)
Total Bilirubin: 0.5 mg/dL (ref 0.3–1.2)
Total Protein: 5.8 g/dL — ABNORMAL LOW (ref 6.5–8.1)

## 2022-06-21 MED ORDER — TRAVASOL 10 % IV SOLN
INTRAVENOUS | Status: AC
  Filled 2022-06-21: qty 1334

## 2022-06-21 MED ORDER — METHOCARBAMOL 500 MG PO TABS
500.0000 mg | ORAL_TABLET | Freq: Three times a day (TID) | ORAL | Status: DC | PRN
  Administered 2022-06-22 – 2022-06-23 (×2): 500 mg via ORAL
  Filled 2022-06-21 (×2): qty 1

## 2022-06-21 NOTE — Progress Notes (Signed)
PROGRESS NOTE  LOUDEN HOUSEWORTH HFS:[id number] DOB: 1950/11/16   PCP: [name], MD  Patient is from: Home  DOA: 05/13/2022 LOS: 44  Chief complaints Chief Complaint  Patient presents with   Altered Mental Status     Brief Narrative / Interim history: 71 year old M with PMH of Crohn's disease s/p extensive surgeries including ileostomy and TPN dependence, anxiety, depression, BPH and chronic pelvic pain found unresponsive at his apartment after suicidal attempt with overdose.  He was intubated due to inability to maintain airway.  Eventually extubated and transferred to hospitalist service.  Initially, psychiatry recommended inpatient psychiatric hospitalization.  However, he persistently denied suicidal ideation since his attempt.  Eventually, psychiatry cleared patient for outpatient follow-up on 06/20/2022.  Plan is to discharge home with his sister in [place] in the next 24 to 48 hours once [facility] RN, TPN and outpatient psychiatric resources in place.  Subjective: Seen and examined earlier this morning.  No major events overnight of this morning.  No major complaints other than some nausea and cramping abdominal pain.  This seems to be chronic issue.  No emesis.  No changes in ostomy output.  Sister at bedside.  Objective: Vitals:   06/20/22 1254 06/20/22 2051 06/21/22 0632 06/21/22 1342  BP: 118/62 133/68 121/60 124/66  Pulse: 65 64 72 (!) 58  Resp: 18 14 18 14   Temp: 97.9 F (36.6 C) 97.8 F (36.6 C) 98.2 F (36.8 C) (!) 97.5 F (36.4 C)  TempSrc: Oral Oral Oral Oral  SpO2: 96% 99% 97% 100%  Weight:      Height:        Examination:  GENERAL: No apparent distress.  Nontoxic. HEENT: MMM.  Vision and hearing grossly intact.  NECK: Supple.  No apparent JVD.  RESP:  No IWOB.  Fair aeration bilaterally. CVS:  RRR. Heart sounds normal.  ABD/GI/GU: BS+. Abd soft, NTND.  Ostomy with normal looking stool. MSK/EXT:  Moves extremities. No apparent deformity. No edema.  SKIN:  no apparent skin lesion or wound NEURO: Awake, alert and oriented appropriately.  No apparent focal neuro deficit. PSYCH: Calm. Normal affect.   Procedures:  Intubation and extubation  Microbiology summarized: COVID-19 PCR negative.  Assessment and plan: Principal Problem:   Metabolic encephalopathy Active Problems:   Chronic anemia   Acute respiratory failure with hypoxia (HCC)   Severe malnutrition (HCC)   Generalized weakness   Drug overdose, intentional, initial encounter ([place])   Chronic pain syndrome   Aspiration pneumonia ([place])  Suicidal attempt with intentional drug overdose. Major depressive disorder Severe anxiety disorder. -Cleared by psychiatry for outpatient follow-up. -Patient to go home with sister in [place] and follow-up with psychiatry  -Continue Zoloft, BuSpar and Atarax   Acute toxic and metabolic encephalopathy: Likely combination of medication overdose as well as hypoxia from pneumonia. Head CT and MRI brain negative for any acute abnormality.  Encephalopathy resolved.   Aspiration pneumonia-in the setting of encephalopathy.  Completed antibiotic course   Dysphagia-had MBS.  SLP recommended dysphagia 3 diet    Crohn's disease: Had multiple surgeries including ileostomy.  Chronically on TPN. -Continue TPN and ileostomy care -HH RN on discharge   Chronic prostatitis/bladder colliculi/BPH: Seen by [name] on 10/15.  Completed 14 days of Bactrim per recommendation by urology.  Voiding without issue. -Continue Flomax and finasteride. -Follow-up with [name] for outpatient   Ileostomy leakage with irritation and dermatitis: Following up with [name] in [facility].  Patient had a PICC line placed in September  24 with the plan to continue TPN until the patient gains 30 to 40 pounds and then consider ileostomy closure. Patient was 130 pound at the placement of the PICC line.  Previous attending discussed with [name] on 11/21.  See note for 11/21  for details. -Continue ileostomy care.   Chronic pelvic pain-hip and pelvic x-ray negative.  No significant finding on CT abdomen and pelvis.  Felt to be causing him some anxiety.  Started on gabapentin -Continue gabapentin.   Transient A-fib: Likely provoked.  Resolved.   Iron deficiency anemia H&H stable. Recent Labs    05/16/22 0446 05/17/22 0419 05/18/22 0541 05/24/22 1044 05/25/22 0226 05/26/22 0441 05/30/22 0200 06/06/22 1012 06/07/22 0423 06/15/22 0425  HGB 10.2* 9.2* 9.2* 9.1* 9.0* 8.5* 8.1* 8.9* 8.8* 9.2*  -Monitor   Nausea and abdominal cramp -Continue PPI, Levsin and Zofran.  Body mass index is 22.41 kg/m.  Severe protein calorie malnutrition: In the setting of chronic disease. Nutrition Problem: Severe Malnutrition Etiology: chronic illness Signs/Symptoms: percent weight loss, severe fat depletion, severe muscle depletion Percent weight loss: 8 % (in 3 months) Interventions: TPN (increase nutrients)   DVT prophylaxis:  enoxaparin (LOVENOX) injection 40 mg Start: 05/13/22 2200  Code Status: Full code Family Communication: Updated patient's sister at bedside Level of care: Med-Surg Status is: Inpatient Remains inpatient appropriate because: Safe disposition   Final disposition: Home with sister and [place] RN Consultants:  Pulmonology Urology Psychiatry  Sch Meds:  Scheduled Meds:  busPIRone  5 mg Oral BID   Chlorhexidine Gluconate Cloth  6 each Topical Daily   vitamin B-12  1,000 mcg Oral Daily   enoxaparin (LOVENOX) injection  40 mg Subcutaneous Q24H   feeding supplement  237 mL Oral Q24H   finasteride  5 mg Oral Daily   gabapentin  100 mg Oral BID   insulin aspart  0-9 Units Subcutaneous 2 times per day   lidocaine  1 patch Transdermal Q24H   methocarbamol  500 mg Oral TID   pantoprazole  40 mg Oral BID AC   sertraline  125 mg Oral Daily   simethicone  80 mg Oral TID WC & HS   tamsulosin  0.4 mg Oral QPC supper   Continuous Infusions:   sodium chloride Stopped (12/87/86 7672)   TPN CYCLIC-ADULT (ION)     PRN Meds:.sodium chloride, acetaminophen, benzonatate, hydrOXYzine, hyoscyamine, morphine, ondansetron (ZOFRAN) IV **OR** ondansetron, mouth rinse, psyllium, sodium chloride flush  Antimicrobials: Anti-infectives (From admission, onward)    Start     Dose/Rate Route Frequency Ordered Stop   06/04/22 2200  sulfamethoxazole-trimethoprim (BACTRIM DS) 800-160 MG per tablet 1 tablet        1 tablet Oral Every 12 hours 06/04/22 1941 06/08/22 1056   05/25/22 2200  sulfamethoxazole-trimethoprim (BACTRIM DS) 800-160 MG per tablet 1 tablet       Note to Pharmacy: Patient taking differently: 25 day course. From 10/18-11/12     1 tablet Oral Every 12 hours 05/25/22 1529 06/03/22 0947   05/15/22 1830  Ampicillin-Sulbactam (UNASYN) 3 g in sodium chloride 0.9 % 100 mL IVPB        3 g 200 mL/hr over 30 Minutes Intravenous Every 6 hours 05/15/22 1821 05/20/22 1629        I have personally reviewed the following labs and images: CBC: Recent Labs  Lab 06/15/22 0425  WBC 6.4  NEUTROABS 3.6  HGB 9.2*  HCT 31.3*  MCV 81.9  PLT 180   BMP &GFR Recent Labs  Lab 06/15/22 0425 06/16/22 0610 06/17/22 0323 06/18/22 0220 06/19/22 0205 06/20/22 0213 06/21/22 0527  NA 140   < > 140 140 140 138 138  K 4.0   < > 3.8 3.8 3.9 3.6 4.1  CL 110   < > 110 110 109 107 107  CO2 25   < > 26 25 27 26 27   GLUCOSE 111*   < > 95 110* 92 116* 103*  BUN 34*   < > 32* 31* 28* 28* 33*  CREATININE 0.82   < > 0.90 0.90 0.94 0.91 0.93  CALCIUM 8.6*   < > 8.2* 8.1* 8.3* 8.3* 8.2*  MG 2.2  --   --   --  2.0  --   --   PHOS 2.9  --   --   --  3.7  --   --    < > = values in this interval not displayed.   Estimated Creatinine Clearance: 75.2 mL/min (by C-G formula based on SCr of 0.93 mg/dL). Liver & Pancreas: Recent Labs  Lab 06/17/22 0323 06/18/22 0220 06/19/22 0205 06/20/22 0213 06/21/22 0527  AST 43* 38 42* 35 37  ALT 39 7 37 34 34   ALKPHOS 111 107 103 95 96  BILITOT 0.4 0.6 0.3 0.4 0.5  PROT 5.9* 5.9* 6.0* 6.5 5.8*  ALBUMIN 2.6* 2.6* 2.6* 2.7* 2.4*   No results for input(s): "LIPASE", "AMYLASE" in the last 168 hours. No results for input(s): "AMMONIA" in the last 168 hours. Diabetic: No results for input(s): "HGBA1C" in the last 72 hours. Recent Labs  Lab 06/19/22 0846 06/19/22 2028 06/20/22 0839 06/20/22 2048 06/21/22 0829  GLUCAP 106* 111* 108* 95 103*   Cardiac Enzymes: No results for input(s): "CKTOTAL", "CKMB", "CKMBINDEX", "TROPONINI" in the last 168 hours. No results for input(s): "PROBNP" in the last 8760 hours. Coagulation Profile: No results for input(s): "INR", "PROTIME" in the last 168 hours. Thyroid Function Tests: No results for input(s): "TSH", "T4TOTAL", "FREET4", "T3FREE", "THYROIDAB" in the last 72 hours. Lipid Profile: Recent Labs    06/19/22 0205  TRIG 84   Anemia Panel: No results for input(s): "VITAMINB12", "FOLATE", "FERRITIN", "TIBC", "IRON", "RETICCTPCT" in the last 72 hours. Urine analysis:    Component Value Date/Time   COLORURINE YELLOW 05/12/2022 1116   APPEARANCEUR CLEAR 05/12/2022 1116   LABSPEC 1.019 05/12/2022 1116   PHURINE 5.0 05/12/2022 1116   GLUCOSEU NEGATIVE 05/12/2022 1116   HGBUR NEGATIVE 05/12/2022 1116   HGBUR negative 02/15/2010 1105   [place] 05/12/2022 1116   BILIRUBINUR neg 10/04/2021 [place] 05/12/2022 1116   PROTEINUR NEGATIVE 05/12/2022 1116   UROBILINOGEN 1.0 10/04/2021 1015   UROBILINOGEN 0.2 12/19/2011 1101   NITRITE NEGATIVE 05/12/2022 1116   LEUKOCYTESUR NEGATIVE 05/12/2022 1116   Sepsis Labs: Invalid input(s): "PROCALCITONIN", "LACTICIDVEN"  Microbiology: No results found for this or any previous visit (from the past 240 hour(s)).  Radiology Studies: No results found.    Fiana Gladu T. [place]  If 7PM-7AM, please contact night-coverage [email or web address] 06/21/2022, 2:30 PM

## 2022-06-21 NOTE — Plan of Care (Signed)

## 2022-06-21 NOTE — TOC Progression Note (Signed)
Transition of Care [facility]) - Progression Note    Patient Details  Name: [name] MRN: [id number] Date of Birth: [birth year]  Transition of Care [facility]) CM/SW Contact  Lennart Pall, LCSW Phone Number: 06/21/2022, 2:59 PM  Clinical Narrative:    TOC continues to work on [facility] and TPN arrangements.  Have been able to secure [facility] coverage with [facility] branch @ [phone]) and have provided info to [facility].  [facility] is completing the referral process for home TPN supplies with [name] as MD to follow for orders.  All involved are hopeful these services/ supplies will be in place for pt beginning the end of this week.   Pt to have a phone interview tomorrow morning with [facility] to confirm acceptance into the [facility] program.   Expected Discharge Plan:  (TBD) Barriers to Discharge: Continued Medical Work up  Expected Discharge Plan and Services Expected Discharge Plan:  (TBD)   Discharge Planning Services: CM Consult Post Acute Care Choice: [place] Living arrangements for the past 2 months: Single Family Home                                       Social Determinants of Health (SDOH) Interventions    Readmission Risk Interventions    05/15/2022   10:53 AM  Readmission Risk Prevention Plan  Transportation Screening Complete  Medication Review (RN Care Manager) Complete  PCP or Specialist appointment within 3-5 days of discharge Complete  HRI or Home Care Consult Complete  SW Recovery Care/Counseling Consult Complete  Palliative Care Screening Not [place] Complete

## 2022-06-21 NOTE — Progress Notes (Signed)
PHARMACY - TOTAL PARENTERAL NUTRITION CONSULT NOTE   Indication: Crohn's disease and complex past surgical history including ex lap with small bowel resection, lysis of adhesion, mesh explantation, and diverting loop ileostomy in April 2023, on TPN prior to admission  Patient Measurements: Height: 5' 10"  (177.8 cm) Weight: 70.9 kg (156 lb 3.2 oz) IBW/kg (Calculated) : 73 TPN AdjBW (KG): 68.1 Body mass index is 22.41 kg/m.  Assessment: Patient with PMH of Crohn's disease and complex surgical history as above on chronic TPN who presented on 05/13/22 with altered mental status after spouse found patient unresponsive with multiple pill bottles open around him. Patient was transported from SNF to ED the day prior for generalized weakness and acute worsening of his chronic abdominal pain. Patient decided to return home instead of his SNF. Pharmacy consulted to resume TPN as inpatient.   Discharge from [place] 04/19/22:  TPN pharmacist at [facility]:  Estimated Nutrient Requirements: Protein: 1.5 - 1.7 g/kg/day (98 - 104 g/day), Total kcal: 30 - 35 kcal/kg/day (1830 - 1983 kcal/day)  TPN Formulation at [place] per TPN pharmacist: [A 6.6%, D 20%, F 3.1%] @ goal rate, 65 mL/hr (TV: 1560 mL/day) provides: Protein 1.7 g/kg/day 103 g/day, Total kcal 32 kcal/kg/day 1971 kcal/day  NPC: N ratio 94   Admitted at [facility] 04/29/22-05/03/22: Nutritional Goals: TPN 80 mL/hr (provides 113 g of protein and 1943 kcals per day) RD Assessment: (10/16): 2100-2450kcal (30-35kcal/kg), Protein 105-130g (1.5-1.8g/kg) Utilize intralipid rather than SMOF as 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  [facility]: [name], PharmD, ph: [phone] 10/28 Most recent formula: Total volume 1560 mL:  Dextrose 220 g/day, Plenamine 120 g/day, SMOFlipid 50 g/day, NaCl 75 mEq, CaGluc 6 mEq, Potassium Ac 20 mEq, Potassium Phos 13.5 nM, Mag Sulfate 8 mEq, Vitamins/trace  elements   GI issues: Crohn's dz with chronic TPN, aspiration, dysphagia (followed by speech), severe PCM, ileostomy leakage  - PICC line placed in September 24 with the plan to continue TPN until the patient gains 30 to 40 pounds and then consider ileostomy closure.  Glucose / Insulin: No hx DM.  - On sSSI (has not used any insulin in the past 24 hrs) -  CBGs at goal <150: wnl Electrolytes: Lytes wnl including CorrCa  Renal: SCr ok; BUN slightly elevated Hepatic: LFTs improved, Tbili WNL - Albumin low 2.4 - TG 109 (11/23), 100 (11/27), 84 (12/4) I/O: -295 mL - ileostomy output: 1275 mL GI Imaging: - 10/14 CTa/p: previous surgical changes noted (transverse-sigmoid anastomosis) with stable inflammation since 10/12 CT - 10/27 CTa/p: no significant interval change since 10/14 CT. No evidence of bowel obstruction or other complicating feature at the anastomotic site or ostomy. - 11/6 abd [place] - normal liver (checked d/t elevated LFTs) - 11/20 CT: Same postoperative changes. Inflammatory change in the right lower quadrant is not significantly changed since 05/12/2022. New patchy opacity in the right lung base. GI Surgeries / Procedures: n/a  Central access: prior to admission - double lumen CVC (03/2022, 130lbs) TPN start date: started PTA  Nutritional Goals: TPN provides 133 g of protein and 2204  kcals per day.  RD Assessment this admission:    Estimated Needs Total Energy Estimated Needs: 2015-2350kcal Total Protein Estimated Needs: 100-135g Total Fluid Estimated Needs: 2435m ([place]oll[place] Segar)  Current Nutrition:  TPN Dysphagia-3 11/9 >> Regular 11/17 >>  Dysphagia 3 11/21 >>   Plan:  Continue Cyclic TPN at 20350ml over 14  hrs Will provide 133 g AA, 26.13% lipids, 2204 kcal GIR 3-6.1 Electrolytes in TPN:  Na 75 mEq/L K 40 mEq/L Ca 5 mEq/L Mg 5 mEq/L Phos 15 mmol/L Cl:Ac = 1:1 Add standard MVI and trace elements to TPN  With excellent CBG control, will limit finger sticks  to twice daily.  Timed 2 hrs after TPN start, 1 hour after TPN stop  Monitor TPN labs on Mon/Thurs at minimum and PRN MD ordered CMET daily    Lynelle Doctor, PharmD, BCPS Clinical Pharmacist 06/21/2022 7:10 AM

## 2022-06-22 ENCOUNTER — Other Ambulatory Visit (HOSPITAL_COMMUNITY): Payer: Self-pay

## 2022-06-22 ENCOUNTER — Encounter: Payer: Self-pay | Admitting: Internal Medicine

## 2022-06-22 DIAGNOSIS — T50902A Poisoning by unspecified drugs, medicaments and biological substances, intentional self-harm, initial encounter: Secondary | ICD-10-CM | POA: Diagnosis not present

## 2022-06-22 DIAGNOSIS — J690 Pneumonitis due to inhalation of food and vomit: Secondary | ICD-10-CM | POA: Diagnosis not present

## 2022-06-22 DIAGNOSIS — R531 Weakness: Secondary | ICD-10-CM | POA: Diagnosis not present

## 2022-06-22 DIAGNOSIS — G9341 Metabolic encephalopathy: Secondary | ICD-10-CM | POA: Diagnosis not present

## 2022-06-22 LAB — MAGNESIUM: Magnesium: 2.1 mg/dL (ref 1.7–2.4)

## 2022-06-22 LAB — COMPREHENSIVE METABOLIC PANEL
ALT: 32 U/L (ref 0–44)
AST: 35 U/L (ref 15–41)
Albumin: 2.6 g/dL — ABNORMAL LOW (ref 3.5–5.0)
Alkaline Phosphatase: 95 U/L (ref 38–126)
Anion gap: 7 (ref 5–15)
BUN: 33 mg/dL — ABNORMAL HIGH (ref 8–23)
CO2: 25 mmol/L (ref 22–32)
Calcium: 8.2 mg/dL — ABNORMAL LOW (ref 8.9–10.3)
Chloride: 108 mmol/L (ref 98–111)
Creatinine, Ser: 0.87 mg/dL (ref 0.61–1.24)
GFR, Estimated: >60 mL/min (ref >60)
Glucose, Bld: 94 mg/dL (ref 70–99)
Potassium: 4 mmol/L (ref 3.5–5.1)
Sodium: 140 mmol/L (ref 135–145)
Total Bilirubin: 0.5 mg/dL (ref 0.3–1.2)
Total Protein: 6.2 g/dL — ABNORMAL LOW (ref 6.5–8.1)

## 2022-06-22 LAB — PHOSPHORUS: Phosphorus: 3.5 mg/dL (ref 2.5–4.6)

## 2022-06-22 LAB — TRIGLYCERIDES: Triglycerides: 74 mg/dL (ref <150)

## 2022-06-22 LAB — GLUCOSE, CAPILLARY
Glucose-Capillary: 93 mg/dL (ref 70–99)
Glucose-Capillary: 89 mg/dL (ref 70–99)

## 2022-06-22 MED ORDER — BUSPIRONE HCL 5 MG PO TABS
5.0000 mg | ORAL_TABLET | Freq: Three times a day (TID) | ORAL | 0 refills | Status: AC
  Filled 2022-06-22: qty 90, 30d supply, fill #0

## 2022-06-22 MED ORDER — SERTRALINE HCL 25 MG PO TABS
125.0000 mg | ORAL_TABLET | Freq: Every day | ORAL | 0 refills | Status: DC
  Filled 2022-06-22: qty 150, 30d supply, fill #0

## 2022-06-22 MED ORDER — MORPHINE SULFATE 15 MG PO TABS
15.0000 mg | ORAL_TABLET | ORAL | 0 refills | Status: AC | PRN
  Filled 2022-06-22: qty 30, 5d supply, fill #0

## 2022-06-22 MED ORDER — HYDROXYZINE HCL 25 MG PO TABS: 25.0000 mg | ORAL_TABLET | Freq: Three times a day (TID) | ORAL | 0 refills | Status: DC | PRN

## 2022-06-22 MED ORDER — ENSURE ENLIVE PO LIQD
237.0000 mL | ORAL | 0 refills | Status: AC
  Filled 2022-06-22: qty 7110, 30d supply, fill #0

## 2022-06-22 MED ORDER — TAMSULOSIN HCL 0.4 MG PO CAPS
0.4000 mg | ORAL_CAPSULE | Freq: Every day | ORAL | 2 refills | Status: DC
  Filled 2022-06-22: qty 30, 30d supply, fill #0

## 2022-06-22 MED ORDER — FINASTERIDE 5 MG PO TABS: 5.0000 mg | ORAL_TABLET | Freq: Every day | ORAL | 2 refills | Status: DC

## 2022-06-22 MED ORDER — HYOSCYAMINE SULFATE 0.125 MG SL SUBL: 0.1250 mg | SUBLINGUAL_TABLET | Freq: Four times a day (QID) | SUBLINGUAL | 0 refills | Status: DC | PRN

## 2022-06-22 MED ORDER — HYOSCYAMINE SULFATE 0.125 MG SL SUBL
0.1250 mg | SUBLINGUAL_TABLET | Freq: Four times a day (QID) | SUBLINGUAL | 0 refills | Status: DC | PRN
  Filled 2022-06-22: qty 30, 8d supply, fill #0

## 2022-06-22 MED ORDER — OXYBUTYNIN CHLORIDE 5 MG PO TABS
5.0000 mg | ORAL_TABLET | Freq: Three times a day (TID) | ORAL | 2 refills | Status: DC
  Filled 2022-06-22: qty 90, 30d supply, fill #0

## 2022-06-22 MED ORDER — SERTRALINE HCL 25 MG PO TABS: 125.0000 mg | ORAL_TABLET | Freq: Every day | ORAL | 0 refills | Status: DC

## 2022-06-22 MED ORDER — ONDANSETRON 4 MG PO TBDP
4.0000 mg | ORAL_TABLET | Freq: Four times a day (QID) | ORAL | 0 refills | Status: DC | PRN
  Filled 2022-06-22: qty 20, 5d supply, fill #0

## 2022-06-22 MED ORDER — ONDANSETRON 4 MG PO TBDP: 4.0000 mg | ORAL_TABLET | Freq: Four times a day (QID) | ORAL | 0 refills | Status: DC | PRN

## 2022-06-22 MED ORDER — TRAVASOL 10 % IV SOLN
INTRAVENOUS | Status: AC
  Filled 2022-06-22: qty 1334

## 2022-06-22 MED ORDER — ALBUTEROL SULFATE HFA 108 (90 BASE) MCG/ACT IN AERS
2.0000 | INHALATION_SPRAY | Freq: Four times a day (QID) | RESPIRATORY_TRACT | 2 refills | Status: DC | PRN
  Filled 2022-06-22: qty 6.7, 25d supply, fill #0

## 2022-06-22 MED ORDER — ENSURE ENLIVE PO LIQD: 237.0000 mL | ORAL | 0 refills | Status: DC

## 2022-06-22 MED ORDER — HYDROXYZINE HCL 25 MG PO TABS
25.0000 mg | ORAL_TABLET | Freq: Three times a day (TID) | ORAL | 0 refills | Status: AC | PRN
  Filled 2022-06-22: qty 90, 30d supply, fill #0

## 2022-06-22 MED ORDER — OXYBUTYNIN CHLORIDE 5 MG PO TABS: 5.0000 mg | ORAL_TABLET | Freq: Three times a day (TID) | ORAL | 2 refills | Status: DC

## 2022-06-22 MED ORDER — TAMSULOSIN HCL 0.4 MG PO CAPS: 0.4000 mg | ORAL_CAPSULE | Freq: Every day | ORAL | 2 refills | Status: DC

## 2022-06-22 MED ORDER — FINASTERIDE 5 MG PO TABS
5.0000 mg | ORAL_TABLET | Freq: Every day | ORAL | 2 refills | Status: AC
  Filled 2022-06-22: qty 30, 30d supply, fill #0

## 2022-06-22 MED ORDER — PANTOPRAZOLE SODIUM 40 MG PO TBEC: 40.0000 mg | DELAYED_RELEASE_TABLET | Freq: Two times a day (BID) | ORAL | 2 refills | Status: DC

## 2022-06-22 MED ORDER — MORPHINE SULFATE 15 MG PO TABS: 15.0000 mg | ORAL_TABLET | ORAL | 0 refills | Status: DC | PRN

## 2022-06-22 MED ORDER — PANTOPRAZOLE SODIUM 40 MG PO TBEC
40.0000 mg | DELAYED_RELEASE_TABLET | Freq: Two times a day (BID) | ORAL | 2 refills | Status: DC
  Filled 2022-06-22: qty 60, 30d supply, fill #0

## 2022-06-22 NOTE — Consult Note (Addendum)
Face-to-Face Psychiatry Consult   Reason for Consult:  Suicide Attempt  Referring Physician:  Red Christians   Patient Identification: [name] MRN:  [id number] Principal Diagnosis: Metabolic encephalopathy Diagnosis:  Principal Problem:   Metabolic encephalopathy Active Problems:   Chronic anemia   Acute respiratory failure with hypoxia (HCC)   Severe malnutrition (HCC)   Generalized weakness   Drug overdose, intentional, initial encounter ([place])   Chronic pain syndrome   Aspiration pneumonia ([place])  Total Time spent with patient: 20 minutes  Subjective:   71yo with hx Crohn's with persistent abd pain s/p extensive surgeries and is currently TPN dependent. Pt presented with unresponsiveness in apparent suicide attempt via overdose.    Patient seen and assessed in his room, lying upright with his wife at bedside. Patient continues to remain interested in seeking outpatient services. He reports his phone assessment with [facility] went well, and they are calling him back with information needed to move forward. He continues to deny any new or acute psychiatric concerns that warrant immediate attention. Initial safety planning was started today and he denies having access to any weapons at this time to include guns and/or knives.  Patient continues to report compliance with medication, currently taking Zoloft 125 mg p.o. daily.  Patient does report improvement in mood, describing it as "better and I am doing okay."  Patient currently has no concerns.  Will continue with outpatient recommendations at this time.    HPI: Currently on interview, the patient. Alert and oriented x4. Patient reports presenting to the emergency department after a suicide attempt via after taking multiple pills in an attempt to end his life. Over the past couple months he reports worsening depression and anxiety. He reports symptoms of depression including hopelessness, guilt/worthlessness, disturbed sleep pattern  (sleeping 3 to 4 hours during the day) loss of interest, low energy, decreased appetite, isolative, crying spells deterioration of ADLs and recurrent thoughts of death.  He reports that his symptoms have increased due to multiple stomach surgeries, deconditioning, and increase in pain. Patient is on TPN to improve nutrition, assist with weight gain. He is also on a dysphagia 3 diet at this time, with no assistance required to feed himself. Patient has met his medically goals and now medically stable for transfer to inpatient psychiatric unit.   Past Psychiatric History: Depression  Risk to Self:   yes suicide attempt Risk to Others:   Denies Prior Inpatient Therapy:   Denies Prior Outpatient Therapy:   Denies  Past Medical History:  Past Medical History:  Diagnosis Date   Acute prostatitis 07/24/2007   Qualifier: Diagnosis of  By: [name] MD, [name]    Allergy    mild   Arthritis    osteoarthritis   Asthma    Blood transfusion without reported diagnosis    BPH (benign prostatic hypertrophy) with urinary obstruction    Crohn's ileitis ([place]) suspected 05/03/2017   Dilated aortic root ([place])    noted on echo 08/2012   Diverticulitis of colon    EPIDIDYMITIS 02/15/2010   Qualifier: Diagnosis of  By: [name] MD, [name]    GERD (gastroesophageal reflux disease)    H/O: GI bleed    Hemorrhoids    Hepatitis 1975   unknown type    HERPES SIMPLEX INFECTION 10/14/2007   Qualifier: Diagnosis of  By: [name] MD, [name]    Hiatal hernia    Ileus following gastrointestinal surgery ([place]) 12/26/2011   Long term (current) use of systemic  steroids 06/18/2018   Psoriasis    sees [name] at [facility].   Recurrent ventral incisional hernia 05/10/2012   SVT (supraventricular tachycardia)    Ulcer 08/21/2013   ileal    Past Surgical History:  Procedure Laterality Date   BOWEL RESECTION  12/19/2011   Procedure: SMALL BOWEL RESECTION;  Surgeon: [name], MD;  Location: [facility];   Service: General;  Laterality: N/A;  with anastamosis and insertion mesh   BRONCHOSCOPY     COLON SURGERY  01/2004   x 2   COLONOSCOPY W/ BIOPSIES  04/26/2017   per [name], no polyps, benign inflammation, repeat in 5 yrs    CYSTOSCOPY     ESOPHAGOGASTRODUODENOSCOPY     HEMICOLECTOMY     left side, at [facility], diverticulitis   [place]     [phone] incisional hernia   ILEOSTOMY     ILEOSTOMY CLOSURE     INSERTION OF MESH  07/31/2012   Procedure: INSERTION OF MESH;  Surgeon: [name], MD;  Location: [facility];  Service: General;;   LAPAROTOMY  12/19/2011   Procedure: EXPLORATORY LAPAROTOMY;  Surgeon: [name], MD;  Location: [facility];  Service: General;  Laterality: N/A;   PACEMAKER IMPLANT N/A 12/01/2020   Procedure: PACEMAKER IMPLANT;  Surgeon: [name], MD;  Location: [facility];  Service: Cardiovascular;  Laterality: N/A;   PACEMAKER INSERTION Left    TONSILLECTOMY     UPPER GASTROINTESTINAL ENDOSCOPY     VASECTOMY     VENTRAL HERNIA REPAIR  07/31/2012   Procedure: HERNIA REPAIR VENTRAL ADULT;  Surgeon: [name], MD;  Location: [facility];  Service: General;  Laterality: N/A;   Family History:  Family History  Problem Relation Age of Onset   Lung cancer Mother    Leukemia Father    Hypertension Father    Heart disease Father    Heart attack Father    Prostate cancer Father    Prostate cancer Paternal Uncle    Colon cancer Neg Hx    Stomach cancer Neg Hx    Colon polyps Neg Hx    Esophageal cancer Neg Hx    Rectal cancer Neg Hx    Family Psychiatric  History: none reported  Social History:  Social History   Substance and Sexual Activity  Alcohol Use Not Currently   Comment: occ     Social History   Substance and Sexual Activity  Drug Use Yes   Types: Oxycodone    Social History   Socioeconomic History   Marital status: Married    Spouse name: Not on file   Number of children: 2    Years of education: Not on file   Highest education level: Not on file  Occupational History   Occupation: EHS Freight forwarder    Employer: [employer]  Tobacco Use   Smoking status: Never   Smokeless tobacco: Never  Vaping Use   Vaping Use: Never used  Substance and Sexual Activity   Alcohol use: Not Currently    Comment: occ   Drug use: Yes    Types: Oxycodone   Sexual activity: Not on file  Other Topics Concern   Not on file  Social History Narrative   He is married with 2 sons 1 son is an Art gallery manager and 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 and 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  2020   He is Nurse, 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 [place]   Rare if any caffeine   Rare alcohol and never smoker   Social Determinants of Health   Financial Resource Strain: Low Risk  (04/10/2022)   Overall Financial Resource Strain (CARDIA)    Difficulty of Paying Living Expenses: Not hard at all  Food Insecurity: No Food Insecurity (05/14/2022)   Hunger Vital Sign    Worried About Running Out of Food in the Last Year: Never true    Ran Out of Food in the Last Year: Never true  Transportation Needs: No Transportation Needs (05/14/2022)   PRAPARE - Hydrologist (Medical): No    Lack of Transportation (Non-Medical): No  Physical Activity: Inactive (04/10/2022)   Exercise Vital Sign    Days of Exercise per Week: 0 days    Minutes of Exercise per Session: 0 min  Stress: Stress Concern Present (04/10/2022)   [place]    Feeling of Stress : To some extent  Social Connections: Not on file   Additional Social History:    Allergies:   Allergies  Allergen Reactions   Purinethol [Mercaptopurine] Other (See Comments)    Pancreatitis    Shellfish Allergy Anaphylaxis and Swelling    Can use standard SMOF lipid formulation for TPN  without any issue.    Humira [Adalimumab] Other (See Comments)    Developed antibodies   Tape Rash   Wound Dressing Adhesive Rash    Labs:  Results for orders placed or performed during the hospital encounter of 05/13/22 (from the past 48 hour(s))  Glucose, capillary     Status: None   Collection Time: 06/20/22  8:48 PM  Result Value Ref Range   Glucose-Capillary 95 70 - 99 mg/dL    Comment: Glucose reference range applies only to samples taken after fasting for at least 8 hours.  Comprehensive metabolic panel     Status: Abnormal   Collection Time: 06/21/22  5:27 AM  Result Value Ref Range   Sodium 138 135 - 145 mmol/L   Potassium 4.1 3.5 - 5.1 mmol/L   Chloride 107 98 - 111 mmol/L   CO2 27 22 - 32 mmol/L   Glucose, Bld 103 (H) 70 - 99 mg/dL    Comment: Glucose reference range applies only to samples taken after fasting for at least 8 hours.   BUN 33 (H) 8 - 23 mg/dL   Creatinine, Ser 0.93 0.61 - 1.24 mg/dL   Calcium 8.2 (L) 8.9 - 10.3 mg/dL   Total Protein 5.8 (L) 6.5 - 8.1 g/dL   Albumin 2.4 (L) 3.5 - 5.0 g/dL   AST 37 15 - 41 U/L   ALT 34 0 - 44 U/L   Alkaline Phosphatase 96 38 - 126 U/L   Total Bilirubin 0.5 0.3 - 1.2 mg/dL   GFR, Estimated >60 >60 mL/min    Comment: (NOTE) Calculated using the CKD-EPI Creatinine Equation (2021)    Anion gap 4 (L) 5 - 15    Comment: Performed at [facility], [place] [address] [place], [place] [address]  Glucose, capillary     Status: Abnormal   Collection Time: 06/21/22  8:29 AM  Result Value Ref Range   Glucose-Capillary 103 (H) 70 - 99 mg/dL    Comment: Glucose reference range applies only to samples taken after fasting for at least 8 hours.  Glucose, capillary  Status: None   Collection Time: 06/21/22  7:44 PM  Result Value Ref Range   Glucose-Capillary 92 70 - 99 mg/dL    Comment: Glucose reference range applies only to samples taken after fasting for at least 8 hours.  Magnesium     Status: None    Collection Time: 06/22/22  4:25 AM  Result Value Ref Range   Magnesium 2.1 1.7 - 2.4 mg/dL    Comment: Performed at [facility], [place] [address] [place], [place] [address]  Phosphorus     Status: None   Collection Time: 06/22/22  4:25 AM  Result Value Ref Range   Phosphorus 3.5 2.5 - 4.6 mg/dL    Comment: Performed at [facility], [place] [address], [place], [place] [address]  Triglycerides     Status: None   Collection Time: 06/22/22  4:25 AM  Result Value Ref Range   Triglycerides 74 <150 mg/dL    Comment: Performed at [facility], [place] [address] [place], [place] [address]  Comprehensive metabolic panel     Status: Abnormal   Collection Time: 06/22/22  4:25 AM  Result Value Ref Range   Sodium 140 135 - 145 mmol/L   Potassium 4.0 3.5 - 5.1 mmol/L   Chloride 108 98 - 111 mmol/L   CO2 25 22 - 32 mmol/L   Glucose, Bld 94 70 - 99 mg/dL    Comment: Glucose reference range applies only to samples taken after fasting for at least 8 hours.   BUN 33 (H) 8 - 23 mg/dL   Creatinine, Ser 0.87 0.61 - 1.24 mg/dL   Calcium 8.2 (L) 8.9 - 10.3 mg/dL   Total Protein 6.2 (L) 6.5 - 8.1 g/dL   Albumin 2.6 (L) 3.5 - 5.0 g/dL   AST 35 15 - 41 U/L   ALT 32 0 - 44 U/L   Alkaline Phosphatase 95 38 - 126 U/L   Total Bilirubin 0.5 0.3 - 1.2 mg/dL   GFR, Estimated >60 >60 mL/min    Comment: (NOTE) Calculated using the CKD-EPI Creatinine Equation (2021)    Anion gap 7 5 - 15    Comment: Performed at [facility], [place] [address], [place], [place] [address]  Glucose, capillary     Status: None   Collection Time: 06/22/22  9:02 AM  Result Value Ref Range   Glucose-Capillary 93 70 - 99 mg/dL    Comment: Glucose reference range applies only to samples taken after fasting for at least 8 hours.    Current Facility-Administered Medications  Medication Dose Route Frequency Provider Last Rate Last Admin   0.9 %  sodium chloride  infusion   Intravenous PRN Lavina Hamman, MD   Stopped at 06/17/22 0505   acetaminophen (TYLENOL) tablet 650 mg  650 mg Oral Q6H PRN Tawni Millers, MD   650 mg at 06/17/22 1824   benzonatate (TESSALON) capsule 100 mg  100 mg Oral TID PRN Eulogio Bear U, DO   100 mg at 06/17/22 0203   busPIRone (BUSPAR) tablet 5 mg  5 mg Oral BID Eugenie Filler, MD   5 mg at 06/22/22 0910   Chlorhexidine Gluconate Cloth 2 % PADS 6 each  6 each Topical Daily Cherene Altes, MD   6 each at 06/22/22 0946   cyanocobalamin (VITAMIN B12) tablet 1,000 mcg  1,000 mcg Oral Daily Lavina Hamman, MD   1,000 mcg at 06/22/22 0911   enoxaparin (LOVENOX)  injection 40 mg  40 mg Subcutaneous Q24H Joette Catching T, MD   40 mg at 06/15/22 2040   feeding supplement (ENSURE ENLIVE / ENSURE PLUS) liquid 237 mL  237 mL Oral Q24H Tawni Millers, MD   237 mL at 06/21/22 1807   finasteride (PROSCAR) tablet 5 mg  5 mg Oral Daily Lavina Hamman, MD   5 mg at 06/22/22 0912   gabapentin (NEURONTIN) capsule 100 mg  100 mg Oral BID Lavina Hamman, MD   100 mg at 06/22/22 0912   hydrOXYzine (ATARAX) tablet 25 mg  25 mg Oral TID PRN Tawni Millers, MD   25 mg at 06/21/22 2033   hyoscyamine (LEVSIN SL) SL tablet 0.125 mg  0.125 mg Sublingual Q6H PRN Lavina Hamman, MD   0.125 mg at 06/22/22 2542   insulin aspart (novoLOG) injection 0-9 Units  0-9 Units Subcutaneous 2 times per day Emiliano Dyer, [facility]   1 Units at 06/18/22 2142   lidocaine (LIDODERM) 5 % 1 patch  1 patch Transdermal Q24H Cristal Generous, NP   1 patch at 06/19/22 1614   methocarbamol (ROBAXIN) tablet 500 mg  500 mg Oral Q8H PRN Mercy Riding, MD       morphine (MSIR) tablet 15 mg  15 mg Oral Q3H PRN Lavina Hamman, MD   15 mg at 06/22/22 0931   ondansetron (ZOFRAN) injection 4 mg  4 mg Intravenous Q6H PRN Lavina Hamman, MD   4 mg at 06/19/22 0956   Or   ondansetron (ZOFRAN) tablet 4 mg  4 mg Oral Q6H PRN Lavina Hamman, MD   4 mg at  06/22/22 1009   Oral care mouth rinse  15 mL Mouth Rinse PRN Candee Furbish, MD   15 mL at 06/09/22 0805   pantoprazole (PROTONIX) EC tablet 40 mg  40 mg Oral BID AC Lavina Hamman, MD   40 mg at 06/22/22 0910   psyllium (HYDROCIL/METAMUCIL) 1 packet  1 packet Oral Daily PRN Lavina Hamman, MD       sertraline (ZOLOFT) tablet 125 mg  125 mg Oral Daily Suella Broad, FNP   125 mg at 06/22/22 1252   simethicone (MYLICON) chewable tablet 80 mg  80 mg Oral TID WC & HS Eugenie Filler, MD   80 mg at 06/22/22 1134   sodium chloride flush (NS) 0.9 % injection 10-40 mL  10-40 mL Intracatheter PRN Cherene Altes, MD   20 mL at 06/12/22 1748   tamsulosin (FLOMAX) capsule 0.4 mg  0.4 mg Oral QPC supper Eugenie Filler, MD   0.4 mg at 70/62/37 6283   TPN CYCLIC-ADULT (ION)   Intravenous Cyclic-See Admin Instructions Lynelle Doctor, RPH        Musculoskeletal: Strength & Muscle Tone: Laying in bed   Gait & Station: Laying in bed   Patient leans: Laying in bed    Psychiatric Specialty Exam: Psychiatric Specialty Exam: Physical Exam Vitals and nursing note reviewed.  Constitutional:      Appearance: Normal appearance. He is normal weight.  Skin:    General: Skin is warm and dry.     Capillary Refill: Capillary refill takes less than 2 seconds.  Neurological:     General: No focal deficit present.     Mental Status: He is alert and oriented to person, place, and time. Mental status is at baseline.  Psychiatric:  Attention and Perception: Attention and perception normal.        Mood and Affect: Affect normal. Mood is anxious and depressed.        Speech: Speech normal.        Behavior: Behavior normal. Behavior is cooperative.        Thought Content: Thought content normal.        Cognition and Memory: Cognition normal.        Judgment: Judgment normal.     Review of Systems  Psychiatric/Behavioral:  Positive for depression. Negative for hallucinations, substance  abuse and suicidal ideas (denies). The patient is nervous/anxious. The patient does not have insomnia.   All other systems reviewed and are negative.   Blood pressure (!) 107/58, pulse 66, temperature 98.3 F (36.8 C), resp. rate 14, height _0  (1.778 m), weight 70.9 kg, SpO2 100 %.Body mass index is 22.41 kg/m.  General Appearance: Fairly Groomed  Eye Contact:  Good  Speech:  Clear and Coherent and Normal Rate  Volume:  Normal  Mood:  Anxious and Depressed  Affect:  Appropriate and Congruent  Thought Process:  Coherent, Goal Directed, Linear, and Descriptions of Associations: Intact  Orientation:  Full (Time, Place, and Person)  Thought Content:  Logical  Suicidal Thoughts:  No  Homicidal Thoughts:  No  Memory:  Immediate;   Fair Recent;   Fair  Judgement:  Fair  Insight:  Fair  Psychomotor Activity:  Normal  Concentration:  Concentration: Fair and Attention Span: Fair  Recall:  [employer] of Knowledge:  Good  Language:  Good  Akathisia:  No  Handed:  Right  AIMS (if indicated):     Assets:  Communication Skills Desire for Improvement Financial Resources/Insurance Leisure Time Resilience Social Support  ADL's:  Intact  Cognition:  WNL  Sleep:         Physical Exam: Physical Exam Vitals and nursing note reviewed.  Constitutional:      Appearance: Normal appearance. He is normal weight.  Skin:    General: Skin is warm and dry.     Capillary Refill: Capillary refill takes less than 2 seconds.  Neurological:     General: No focal deficit present.     Mental Status: He is alert and oriented to person, place, and time. Mental status is at baseline.  Psychiatric:        Attention and Perception: Attention and perception normal.        Mood and Affect: Affect normal. Mood is anxious and depressed.        Speech: Speech normal.        Behavior: Behavior normal. Behavior is cooperative.        Thought Content: Thought content normal.        Cognition and Memory:  Cognition normal.        Judgment: Judgment normal.    Review of Systems  Psychiatric/Behavioral:  Positive for depression. Negative for hallucinations, substance abuse and suicidal ideas (denies). The patient is nervous/anxious. The patient does not have insomnia.   All other systems reviewed and are negative.   Blood pressure (!) 107/58, pulse 66, temperature 98.3 F (36.8 C), resp. rate 14, height _1  (1.778 m), weight 70.9 kg, SpO2 100 %. Body mass index is 22.41 kg/m.  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 Has continued to deny suicidality since his attempt.  -Will begin safety planning, discharge planning, referral for partial hospitalization programming versus intensive outpatient programming.  Patient is open to either option, while PHP will be more suitable to prevent further readmission into the hospital. Intake  06/26/2022 @ 8:30am [place] [facility] [place]. Acceptance confirmation received from [name] at [phone]. Medical records have been faxed over to [phone]. -Continue sertraline 125 mg p.o. daily.   Disposition: Recommend psychiatric Inpatient admission when medically cleared. Suella Broad, FNP 06/22/2022 2:29 PM

## 2022-06-22 NOTE — Progress Notes (Signed)
Speech Language Pathology Treatment: Dysphagia  Patient Details Name: [name] MRN: [id number] DOB: [birth year] Today's Date: 06/22/2022 Time: 1210-1243 SLP Time Calculation (min) (ACUTE ONLY): 33 min  Assessment / Plan / Recommendation Clinical Impression  Pt seen to determine readiness for dietary advancement and tolerance of diet.  Pt currently fully alert and ambulating, voice is strong and clear. Pt without any indication of dysphagia across all po intake- Reeses peanut butter and 3 ounces of water.  Earlier in hospital coarse, pt with wet voice, pharyngeal retention without sensation. Denies issues with coughing/choking with any intake consumed.  Given pt ambulatory, weight gain of 15 pounds and appetite improvement with dysphagia resolution - will advance diet to regular/thin.  Educated pt to general precautions - No SLP follow up indicated.     HPI HPI: Pt is a 71 yo male admit 10/28 after being obtunded found down 10/30 - ? polypharmacy,  tx to ICU for intubation, 10/31 waking up and following commands overnight.  He was extubated 11/1 and failed swallow screen thus SLP eval was ordered.  He also has h/o Crohns disease - complex hx including small bowel perf -surgical history, short gut- TPN.  MRI showed No acute intracranial abnormality.  2. 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  Chest imaging 11/21 concerning for right infrahilar airspace consolidation concerning  for pneumonia or aspiration.  Repeat MBS indicated due to new findings on chest imaging.      SLP Plan  Continue with current plan of care      Recommendations for follow up therapy are one component of a multi-disciplinary discharge planning process, led by the attending physician.  Recommendations may be updated based on patient status, additional functional criteria and insurance authorization.    Recommendations  Diet recommendations:  Regular;Thin liquid Liquids provided via: Cup Medication Administration: Whole meds with liquid Supervision: Patient able to self feed Compensations: Slow rate;Small sips/bites;Other (Comment) Postural Changes and/or Swallow Maneuvers: Seated upright 90 degrees;Upright 30-60 min after meal                Oral Care Recommendations:  (thin water ok anytime when not consuming foods) Follow Up Recommendations: No SLP follow up Assistance recommended at discharge: Frequent or constant Supervision/Assistance SLP Visit Diagnosis: Dysphagia, oropharyngeal phase (R13.12) Plan: Continue with current plan of care        Kathleen Lime, MS [place] Office [phone] Pager [phone]    Macario Golds  06/22/2022, 12:43 PM

## 2022-06-22 NOTE — TOC Transition Note (Signed)
Transition of Care [facility]) - CM/SW Discharge Note   Patient Details  Name: [name] MRN: [id number] Date of Birth: 24-Jan-1951  [facility]) CM/SW Contact:  Lennart Pall, LCSW Phone Number: 06/22/2022, 4:28 PM   Clinical Narrative:     Anticipate pt will dc tomorrow home with his sister, Marcie Bal and brother-in-law in [place].  Have secured HHRN coverage through [facility] and TPN coverage with [facility]. Pt has been approved for behavioral health follow up with [facility] (see psychiatry progress note).   Pt, wife, son and sister all aware and in agreement with plans.  Contact information for follow up agencies to be placed on AVS.  Final next level of care:  (TBD) Barriers to Discharge: Barriers Resolved   Patient Goals and CMS Choice Patient states their goals for this hospitalization and ongoing recovery are::  (Per [name](son) prefer rehab.) CMS Medicare.gov Compare Post Acute Care list provided to:: Patient Represenative (must comment) (Kelsie(Son)) Choice offered to / list presented to : Adult Children  Discharge Placement                       Discharge Plan and Services   Discharge Planning Services: CM Consult Post Acute Care Choice: Skilled Nursing Facility                    HH Arranged: RN, TPN [facility] Agency: [place] Date [facility] Agency Contacted: 06/20/22   Representative spoke with at [place]: Verda Cumins;  [facility] [place] Determinants of Health (SDOH) Interventions     Readmission Risk Interventions    05/15/2022   10:53 AM  Readmission Risk Prevention Plan  Transportation Screening Complete  Medication Review (RN Care Manager) Complete  PCP or Specialist appointment within 3-5 days of discharge Complete  HRI or [place] Complete  SW Recovery Care/Counseling Consult Complete  Palliative Care Screening Not [place] Complete

## 2022-06-22 NOTE — Progress Notes (Signed)
PROGRESS NOTE  [name]:[id number] DOB: 1950/12/22   PCP: [name], MD  Patient is from: Home  DOA: 05/13/2022 LOS: 20  Chief complaints Chief Complaint  Patient presents with   Altered Mental Status     Brief Narrative / Interim history: 71 year old M with PMH of Crohn's disease s/p extensive surgeries including ileostomy and TPN dependence, anxiety, depression, BPH and chronic pelvic pain found unresponsive at his apartment after suicidal attempt with overdose.  He was intubated due to inability to maintain airway.  Eventually extubated and transferred to hospitalist service.  Initially, psychiatry recommended inpatient psychiatric hospitalization.  However, he persistently denied suicidal ideation since his attempt.  Eventually, psychiatry cleared patient for outpatient follow-up on 06/20/2022.  Plan is to discharge home with his sister in [place] in the next 24 to 48 hours once [facility] RN, TPN and outpatient psychiatric resources in place.  Subjective: Seen and examined earlier this morning.  No major events overnight of this morning.  No complaints.  Nausea and abdominal pain improved.  Patient's son at bedside.  Objective: Vitals:   06/20/22 2051 06/21/22 0632 06/21/22 1342 06/22/22 0555  BP: 133/68 121/60 124/66 (!) 122/59  Pulse: 64 72 (!) 58 72  Resp: 14 18 14 17   Temp:  98.2 F (36.8 C) (!) 97.5 F (36.4 C) 97.8 F (36.6 C)  TempSrc: Oral Oral Oral Oral  SpO2: 99% 97% 100% 97%  Weight:      Height:        Examination:   GENERAL: No apparent distress.  Nontoxic. HEENT: MMM.  Vision and hearing grossly intact.  NECK: Supple.  No apparent JVD.  RESP:  No IWOB.  Fair aeration bilaterally. CVS:  RRR. Heart sounds normal.  ABD/GI/GU: BS+. Abd soft, NTND.  Ostomy with normal looking stool. MSK/EXT:  Moves extremities. No apparent deformity. No edema.  SKIN: no apparent skin lesion or wound NEURO: Awake and alert. Oriented appropriately.  No apparent focal  neuro deficit. PSYCH: Calm. Normal affect.   Procedures:  Intubation and extubation  Microbiology summarized: COVID-19 PCR negative.  Assessment and plan: Principal Problem:   Metabolic encephalopathy Active Problems:   Chronic anemia   Acute respiratory failure with hypoxia (HCC)   Severe malnutrition (HCC)   Generalized weakness   Drug overdose, intentional, initial encounter ([place])   Chronic pain syndrome   Aspiration pneumonia ([place])  Suicidal attempt with intentional drug overdose. Major depressive disorder Severe anxiety disorder. -Cleared by psychiatry for outpatient follow-up.   -Patient to go home with sister in [place] and follow-up with psychiatry.  Had telephone visit on 12/7. -Continue Zoloft, BuSpar and Atarax   Acute toxic and metabolic encephalopathy: Likely combination of medication overdose as well as hypoxia from pneumonia. Head CT and MRI brain negative for any acute abnormality.  Encephalopathy resolved.   Aspiration pneumonia-in the setting of encephalopathy.  Completed antibiotic course   Dysphagia-had MBS.  SLP recommended dysphagia 3 diet    Crohn's disease: Had multiple surgeries including ileostomy.  Chronically on TPN. -Continue TPN and ileostomy care -HH RN on discharge   Chronic prostatitis/bladder colliculi/BPH: Seen by [name] on 10/15.  Completed 14 days of Bactrim per recommendation by urology.  Voiding without issue. -Continue Flomax and finasteride. -Follow-up with [name] for outpatient   Ileostomy leakage with irritation and dermatitis: [facility] up [name].  Patient had a PICC line placed in September 24 with the plan to continue TPN until the patient gains 30  to 40 pounds and then consider ileostomy closure. Patient was 130 pound at the placement of the PICC line.  Previous attending discussed with [name] on 11/21.  See note for 11/21 for details. -Continue ileostomy care.   Chronic pelvic pain-hip  and pelvic x-ray negative.  No significant finding on CT abdomen and pelvis.  Felt to be causing him some anxiety.  Started on gabapentin -Continue gabapentin.   Transient A-fib: Likely provoked.  Resolved.   Iron deficiency anemia H&H stable. Recent Labs    05/16/22 0446 05/17/22 0419 05/18/22 0541 05/24/22 1044 05/25/22 0226 05/26/22 0441 05/30/22 0200 06/06/22 1012 06/07/22 0423 06/15/22 0425  HGB 10.2* 9.2* 9.2* 9.1* 9.0* 8.5* 8.1* 8.9* 8.8* 9.2*  -Monitor   Nausea and abdominal cramp: Improved. -Continue PPI, Levsin and Zofran.  Body mass index is 22.41 kg/m.  Severe protein calorie malnutrition: In the setting of chronic disease. Nutrition Problem: Severe Malnutrition Etiology: chronic illness Signs/Symptoms: percent weight loss, severe fat depletion, severe muscle depletion Percent weight loss: 8 % (in 3 months) Interventions: TPN (increase nutrients)   DVT prophylaxis:  enoxaparin (LOVENOX) injection 40 mg Start: 05/13/22 2200  Code Status: Full code Family Communication: Updated patient's son at bedside. Level of care: Med-Surg Status is: Inpatient Remains inpatient appropriate because: Safe disposition   Final disposition: Home with sister and [place] RN Consultants:  Pulmonology Urology Psychiatry  Sch Meds:  Scheduled Meds:  busPIRone  5 mg Oral BID   Chlorhexidine Gluconate Cloth  6 each Topical Daily   vitamin B-12  1,000 mcg Oral Daily   enoxaparin (LOVENOX) injection  40 mg Subcutaneous Q24H   feeding supplement  237 mL Oral Q24H   finasteride  5 mg Oral Daily   gabapentin  100 mg Oral BID   insulin aspart  0-9 Units Subcutaneous 2 times per day   lidocaine  1 patch Transdermal Q24H   pantoprazole  40 mg Oral BID AC   sertraline  125 mg Oral Daily   simethicone  80 mg Oral TID WC & HS   tamsulosin  0.4 mg Oral QPC supper   Continuous Infusions:  sodium chloride Stopped (73/71/06 2694)   TPN CYCLIC-ADULT (ION)     PRN Meds:.sodium  chloride, acetaminophen, benzonatate, hydrOXYzine, hyoscyamine, methocarbamol, morphine, ondansetron (ZOFRAN) IV **OR** ondansetron, mouth rinse, psyllium, sodium chloride flush  Antimicrobials: Anti-infectives (From admission, onward)    Start     Dose/Rate Route Frequency Ordered Stop   06/04/22 2200  sulfamethoxazole-trimethoprim (BACTRIM DS) 800-160 MG per tablet 1 tablet        1 tablet Oral Every 12 hours 06/04/22 1941 06/08/22 1056   05/25/22 2200  sulfamethoxazole-trimethoprim (BACTRIM DS) 800-160 MG per tablet 1 tablet       Note to Pharmacy: Patient taking differently: 25 day course. From 10/18-11/12     1 tablet Oral Every 12 hours 05/25/22 1529 06/03/22 0947   05/15/22 1830  Ampicillin-Sulbactam (UNASYN) 3 g in sodium chloride 0.9 % 100 mL IVPB        3 g 200 mL/hr over 30 Minutes Intravenous Every 6 hours 05/15/22 1821 05/20/22 1629        I have personally reviewed the following labs and images: CBC: No results for input(s): "WBC", "NEUTROABS", "HGB", "HCT", "MCV", "PLT" in the last 168 hours.  BMP &GFR Recent Labs  Lab 06/18/22 0220 06/19/22 0205 06/20/22 0213 06/21/22 0527 06/22/22 0425  NA 140 140 138 138 140  K 3.8 3.9 3.6 4.1 4.0  CL  110 109 107 107 108  CO2 25 27 26 27 25   GLUCOSE 110* 92 116* 103* 94  BUN 31* 28* 28* 33* 33*  CREATININE 0.90 0.94 0.91 0.93 0.87  CALCIUM 8.1* 8.3* 8.3* 8.2* 8.2*  MG  --  2.0  --   --  2.1  PHOS  --  3.7  --   --  3.5   Estimated Creatinine Clearance: 80.4 mL/min (by C-G formula based on SCr of 0.87 mg/dL). Liver & Pancreas: Recent Labs  Lab 06/18/22 0220 06/19/22 0205 06/20/22 0213 06/21/22 0527 06/22/22 0425  AST 38 42* 35 37 35  ALT 7 37 34 34 32  ALKPHOS 107 103 95 96 95  BILITOT 0.6 0.3 0.4 0.5 0.5  PROT 5.9* 6.0* 6.5 5.8* 6.2*  ALBUMIN 2.6* 2.6* 2.7* 2.4* 2.6*   No results for input(s): "LIPASE", "AMYLASE" in the last 168 hours. No results for input(s): "AMMONIA" in the last 168 hours. Diabetic: No  results for input(s): "HGBA1C" in the last 72 hours. Recent Labs  Lab 06/20/22 0839 06/20/22 2048 06/21/22 0829 06/21/22 1944 06/22/22 0902  GLUCAP 108* 95 103* 92 93   Cardiac Enzymes: No results for input(s): "CKTOTAL", "CKMB", "CKMBINDEX", "TROPONINI" in the last 168 hours. No results for input(s): "PROBNP" in the last 8760 hours. Coagulation Profile: No results for input(s): "INR", "PROTIME" in the last 168 hours. Thyroid Function Tests: No results for input(s): "TSH", "T4TOTAL", "FREET4", "T3FREE", "THYROIDAB" in the last 72 hours. Lipid Profile: Recent Labs    06/22/22 0425  TRIG 74   Anemia Panel: No results for input(s): "VITAMINB12", "FOLATE", "FERRITIN", "TIBC", "IRON", "RETICCTPCT" in the last 72 hours. Urine analysis:    Component Value Date/Time   COLORURINE YELLOW 05/12/2022 1116   APPEARANCEUR CLEAR 05/12/2022 1116   LABSPEC 1.019 05/12/2022 1116   PHURINE 5.0 05/12/2022 1116   GLUCOSEU NEGATIVE 05/12/2022 1116   HGBUR NEGATIVE 05/12/2022 1116   HGBUR negative 02/15/2010 1105   [place] 05/12/2022 1116   BILIRUBINUR neg 10/04/2021 [place] 05/12/2022 1116   PROTEINUR NEGATIVE 05/12/2022 1116   UROBILINOGEN 1.0 10/04/2021 1015   UROBILINOGEN 0.2 12/19/2011 1101   NITRITE NEGATIVE 05/12/2022 1116   LEUKOCYTESUR NEGATIVE 05/12/2022 1116   Sepsis Labs: Invalid input(s): "PROCALCITONIN", "LACTICIDVEN"  Microbiology: No results found for this or any previous visit (from the past 240 hour(s)).  Radiology Studies: No results found.    Faye Sanfilippo T. [place]  If 7PM-7AM, please contact night-coverage [email or web address] 06/22/2022, 12:32 PM

## 2022-06-22 NOTE — Consult Note (Signed)
[place] Nurse ostomy follow up Patient seen because he reported had questions about ostomy supplies. He is established with a supplier and can order upon discharge home. He is to have and HHRN at home with his family in [place] and will see the surgeon at [facility] next Wednesday.  He as 8 pouches, 8 rings, and a spare belt in the room for discharge. Pouch applied on Tuesday is still intact.  Pouch was over-filled when I saw patient today and I encouraged him to go to the bathroom and empty. His wife is in the room and we discuss whether or not she is able to assist him with pouch changes and she says she is not. Her son, whom I meet on Tuesday and who is an EMT, does not live near his parents and is not available as a Theatre manager.  [facility] nursing team will not follow closely, but will remain available to this patient, the nursing and medical teams.  Please re-consult if needed.  Thank you for inviting [place] to participate in this patient's Plan of Care.  Maudie Flakes, MSN, RN, CNS, [place], Serita Grammes, [employer], [employer] phone:  [phone]

## 2022-06-22 NOTE — Discharge Instructions (Signed)
MY AFTER VISIT FOLLOW UP & SAFETY PLAN  Call my social support system -  Name: [name] Contact #: [phone]  Name: Contact #:   Call my doctor, psychiatrist and/or therapist -  MD Name: Contact #:  Therapist Name: Contact #:   3. Call the [place] or [phone] or the Suicide/Crisis Hotline number 597  4. If I cannot reach anyone, I will call 911 for emergency assistance  5. Go to the nearest hospital emergency department  I understand and agree with my safety plan. I will go to my follow-up appointment. If I do not have a follow-up appointment scheduled or if I miss my planned follow-up appointment I will schedule a follow-up appointment and attend.   Discussed safety plan with patient's mother who verbalized understanding.  Reviewed methods to reduce the risk of self-injury or suicide attempts: Frequent conversations regarding unsafe thoughts. Remove all significant sharps. Remove all firearms. Remove all medications, including over-the-counter meds. Consider lockbox for medications and having a responsible person dispense medications until patient has strengthened coping skills. Room checks for sharps or other harmful objects. Secure all chemical substances that can be ingested or inhaled.

## 2022-06-22 NOTE — Progress Notes (Signed)
PHARMACY - TOTAL PARENTERAL NUTRITION CONSULT NOTE   Indication: Crohn's disease and complex past surgical history including ex lap with small bowel resection, lysis of adhesion, mesh explantation, and diverting loop ileostomy in April 2023, on TPN prior to admission  Patient Measurements: Height: 5' 10"  (177.8 cm) Weight: 70.9 kg (156 lb 3.2 oz) IBW/kg (Calculated) : 73 TPN AdjBW (KG): 68.1 Body mass index is 22.41 kg/m.  Assessment: Patient with PMH of Crohn's disease and complex surgical history as above on chronic TPN who presented on 05/13/22 with altered mental status after spouse found patient unresponsive with multiple pill bottles open around him. Patient was transported from SNF to ED the day prior for generalized weakness and acute worsening of his chronic abdominal pain. Patient decided to return home instead of his SNF. Pharmacy consulted to resume TPN as inpatient.   Discharge from [facility] 04/19/22:  TPN pharmacist at [place]:  Estimated Nutrient Requirements: Protein: 1.5 - 1.7 g/kg/day (98 - 104 g/day), Total kcal: 30 - 35 kcal/kg/day (1830 - 1983 kcal/day)  TPN Formulation at [place] per TPN pharmacist: [A 6.6%, D 20%, F 3.1%] @ goal rate, 65 mL/hr (TV: 1560 mL/day) provides: Protein 1.7 g/kg/day 103 g/day, Total kcal 32 kcal/kg/day 1971 kcal/day  NPC: N ratio 94   Admitted at [facility] 04/29/22-05/03/22: Nutritional Goals: TPN 80 mL/hr (provides 113 g of protein and 1943 kcals per day) RD Assessment: (10/16): 2100-2450kcal (30-35kcal/kg), Protein 105-130g (1.5-1.8g/kg) Utilize intralipid rather than SMOF as 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  [facility]: [name], PharmD, ph: [phone] 10/28 Most recent formula: Total volume 1560 mL:  Dextrose 220 g/day, Plenamine 120 g/day, SMOFlipid 50 g/day, NaCl 75 mEq, CaGluc 6 mEq, Potassium Ac 20 mEq, Potassium Phos 13.5 nM, Mag Sulfate 8 mEq, Vitamins/trace  elements   GI issues: Crohn's dz with chronic TPN, aspiration, dysphagia (followed by speech), severe PCM, ileostomy leakage  - PICC line placed in September 24 with the plan to continue TPN until the patient gains 30 to 40 pounds and then consider ileostomy closure.  Glucose / Insulin: No hx DM.  - On sSSI (has not used any insulin in the past 24 hrs) -  CBGs at goal <150: wnl Electrolytes: Lytes wnl including CorrCa  Renal: SCr ok; BUN slightly elevated Hepatic: LFTs andTbili WNL - Albumin low 2.6 - TG 109 (11/23), 100 (11/27), 84 (12/4), 74 (12/7) I/O: +2515 mL - ileostomy output: 750 mL GI Imaging: - 10/14 CTa/p: previous surgical changes noted (transverse-sigmoid anastomosis) with stable inflammation since 10/12 CT - 10/27 CTa/p: no significant interval change since 10/14 CT. No evidence of bowel obstruction or other complicating feature at the anastomotic site or ostomy. - 11/6 abd [place] - normal liver (checked d/t elevated LFTs) - 11/20 CT: Same postoperative changes. Inflammatory change in the right lower quadrant is not significantly changed since 05/12/2022. New patchy opacity in the right lung base. GI Surgeries / Procedures: n/a  Central access: prior to admission - double lumen CVC (03/2022, 130lbs) TPN start date: started PTA  Nutritional Goals: TPN provides 133 g of protein and 2204  kcals per day.  RD Assessment this admission:    Estimated Needs Total Energy Estimated Needs: 2015-2350kcal Total Protein Estimated Needs: 100-135g Total Fluid Estimated Needs: 2472m (Holliday Segar)  Current Nutrition:  TPN Dysphagia-3 11/9 >> Regular 11/17 >>  Dysphagia 3 11/21 >>   Plan:  Continue Cyclic TPN at 23845ml over  14 hrs Will provide 133 g AA, 26.13% lipids, 2204 kcal GIR 3-6.1 Electrolytes in TPN:  Na 75 mEq/L K 40 mEq/L Ca 5 mEq/L Mg 5 mEq/L Phos 15 mmol/L Cl:Ac = 1:1 Add standard MVI and trace elements to TPN  With excellent CBG control, will limit finger  sticks to twice daily.  Timed 2 hrs after TPN start, 1 hour after TPN stop  Monitor TPN labs on Mon/Thurs at minimum and PRN MD ordered CMET daily    Lynelle Doctor, PharmD, BCPS Clinical Pharmacist 06/22/2022 7:01 AM

## 2022-06-22 NOTE — Plan of Care (Signed)
?  Problem: Activity: ?Goal: Risk for activity intolerance will decrease ?Outcome: Progressing ?  ?Problem: Safety: ?Goal: Ability to remain free from injury will improve ?Outcome: Progressing ?  ?Problem: Pain Managment: ?Goal: General experience of comfort will improve ?Outcome: Progressing ?  ?

## 2022-06-23 ENCOUNTER — Other Ambulatory Visit (HOSPITAL_COMMUNITY): Payer: Self-pay

## 2022-06-23 DIAGNOSIS — R531 Weakness: Secondary | ICD-10-CM | POA: Diagnosis not present

## 2022-06-23 DIAGNOSIS — E43 Unspecified severe protein-calorie malnutrition: Secondary | ICD-10-CM | POA: Diagnosis not present

## 2022-06-23 DIAGNOSIS — T50902A Poisoning by unspecified drugs, medicaments and biological substances, intentional self-harm, initial encounter: Secondary | ICD-10-CM | POA: Diagnosis not present

## 2022-06-23 DIAGNOSIS — G9341 Metabolic encephalopathy: Secondary | ICD-10-CM | POA: Diagnosis not present

## 2022-06-23 LAB — COMPREHENSIVE METABOLIC PANEL
ALT: 31 U/L (ref 0–44)
AST: 33 U/L (ref 15–41)
Albumin: 2.6 g/dL — ABNORMAL LOW (ref 3.5–5.0)
Alkaline Phosphatase: 91 U/L (ref 38–126)
Anion gap: 5 (ref 5–15)
BUN: 28 mg/dL — ABNORMAL HIGH (ref 8–23)
CO2: 26 mmol/L (ref 22–32)
Calcium: 8.2 mg/dL — ABNORMAL LOW (ref 8.9–10.3)
Chloride: 106 mmol/L (ref 98–111)
Creatinine, Ser: 0.83 mg/dL (ref 0.61–1.24)
GFR, Estimated: >60 mL/min (ref >60)
Glucose, Bld: 102 mg/dL — ABNORMAL HIGH (ref 70–99)
Potassium: 4 mmol/L (ref 3.5–5.1)
Sodium: 137 mmol/L (ref 135–145)
Total Bilirubin: 0.6 mg/dL (ref 0.3–1.2)
Total Protein: 5.7 g/dL — ABNORMAL LOW (ref 6.5–8.1)

## 2022-06-23 LAB — GLUCOSE, CAPILLARY: Glucose-Capillary: 111 mg/dL — ABNORMAL HIGH (ref 70–99)

## 2022-06-23 MED ORDER — TRAVASOL 10 % IV SOLN
INTRAVENOUS | Status: DC
  Filled 2022-06-23: qty 1334

## 2022-06-23 MED ORDER — HEPARIN SOD (PORK) LOCK FLUSH 100 UNIT/ML IV SOLN
250.0000 [IU] | INTRAVENOUS | Status: DC | PRN
  Administered 2022-06-23: 250 [IU]
  Filled 2022-06-23: qty 2.5

## 2022-06-23 NOTE — Consult Note (Signed)
Face-to-Face Psychiatry Consult   Reason for Consult:  Suicide Attempt  Referring Physician:  Red Christians   Patient Identification: [name] MRN:  [id number] Principal Diagnosis: Metabolic encephalopathy Diagnosis:  Principal Problem:   Metabolic encephalopathy Active Problems:   Chronic anemia   Acute respiratory failure with hypoxia (HCC)   Severe malnutrition (HCC)   Generalized weakness   Drug overdose, intentional, initial encounter ([place])   Chronic pain syndrome   Aspiration pneumonia ([place])  Total Time spent with patient: 20 minutes  Subjective:   71yo with hx Crohn's with persistent abd pain s/p extensive surgeries and is currently TPN dependent. Pt presented with unresponsiveness in apparent suicide attempt via overdose.    Patient seen and assessed in his room, lying upright with his wife at bedside. Patient continues to remain interested in seeking outpatient services. He reports his phone assessment with [facility] went well, and they are calling him back with information needed to move forward. He continues to deny any new or acute psychiatric concerns that warrant immediate attention. Initial safety planning was started today and he denies having access to any weapons at this time to include guns and/or knives.  Patient continues to report compliance with medication, currently taking Zoloft 125 mg p.o. daily.  Patient does report improvement in mood, describing it as "better and I am doing okay."  Patient currently has no concerns.  Will continue with outpatient recommendations at this time.    HPI: Currently on interview, the patient. Alert and oriented x4. Patient reports presenting to the emergency department after a suicide attempt via after taking multiple pills in an attempt to end his life. Over the past couple months he reports worsening depression and anxiety. He reports symptoms of depression including hopelessness, guilt/worthlessness, disturbed sleep pattern  (sleeping 3 to 4 hours during the day) loss of interest, low energy, decreased appetite, isolative, crying spells deterioration of ADLs and recurrent thoughts of death.  He reports that his symptoms have increased due to multiple stomach surgeries, deconditioning, and increase in pain. Patient is on TPN to improve nutrition, assist with weight gain. He is also on a dysphagia 3 diet at this time, with no assistance required to feed himself. Patient has met his medically goals and now medically stable for transfer to inpatient psychiatric unit.   Past Psychiatric History: Depression  Risk to Self:   yes suicide attempt Risk to Others:   Denies Prior Inpatient Therapy:   Denies Prior Outpatient Therapy:   Denies  Past Medical History:  Past Medical History:  Diagnosis Date   Acute prostatitis 07/24/2007   Qualifier: Diagnosis of  By: [name] MD, [name]    Allergy    mild   Arthritis    osteoarthritis   Asthma    Blood transfusion without reported diagnosis    BPH (benign prostatic hypertrophy) with urinary obstruction    Crohn's ileitis ([place]) suspected 05/03/2017   Dilated aortic root ([place])    noted on echo 08/2012   Diverticulitis of colon    EPIDIDYMITIS 02/15/2010   Qualifier: Diagnosis of  By: [name] MD, [name]    GERD (gastroesophageal reflux disease)    H/O: GI bleed    Hemorrhoids    Hepatitis 1975   unknown type    HERPES SIMPLEX INFECTION 10/14/2007   Qualifier: Diagnosis of  By: [name] MD, [name]    Hiatal hernia    Ileus following gastrointestinal surgery ([place]) 12/26/2011   Long term (current) use of systemic  steroids 06/18/2018   Psoriasis    sees [name] at [facility].   Recurrent ventral incisional hernia 05/10/2012   SVT (supraventricular tachycardia)    Ulcer 08/21/2013   ileal    Past Surgical History:  Procedure Laterality Date   BOWEL RESECTION  12/19/2011   Procedure: SMALL BOWEL RESECTION;  Surgeon: [name], MD;  Location: [facility];   Service: General;  Laterality: N/A;  with anastamosis and insertion mesh   BRONCHOSCOPY     COLON SURGERY  01/2004   x 2   COLONOSCOPY W/ BIOPSIES  04/26/2017   per [name], no polyps, benign inflammation, repeat in 5 yrs    CYSTOSCOPY     ESOPHAGOGASTRODUODENOSCOPY     HEMICOLECTOMY     left side, at [facility], diverticulitis   [place]     [phone] incisional hernia   ILEOSTOMY     ILEOSTOMY CLOSURE     INSERTION OF MESH  07/31/2012   Procedure: INSERTION OF MESH;  Surgeon: [name], MD;  Location: [facility];  Service: General;;   LAPAROTOMY  12/19/2011   Procedure: EXPLORATORY LAPAROTOMY;  Surgeon: [name], MD;  Location: [facility];  Service: General;  Laterality: N/A;   PACEMAKER IMPLANT N/A 12/01/2020   Procedure: PACEMAKER IMPLANT;  Surgeon: [name], MD;  Location: [facility];  Service: Cardiovascular;  Laterality: N/A;   PACEMAKER INSERTION Left    TONSILLECTOMY     UPPER GASTROINTESTINAL ENDOSCOPY     VASECTOMY     VENTRAL HERNIA REPAIR  07/31/2012   Procedure: HERNIA REPAIR VENTRAL ADULT;  Surgeon: [name], MD;  Location: [facility];  Service: General;  Laterality: N/A;   Family History:  Family History  Problem Relation Age of Onset   Lung cancer Mother    Leukemia Father    Hypertension Father    Heart disease Father    Heart attack Father    Prostate cancer Father    Prostate cancer Paternal Uncle    Colon cancer Neg Hx    Stomach cancer Neg Hx    Colon polyps Neg Hx    Esophageal cancer Neg Hx    Rectal cancer Neg Hx    Family Psychiatric  History: none reported  Social History:  Social History   Substance and Sexual Activity  Alcohol Use Not Currently   Comment: occ     Social History   Substance and Sexual Activity  Drug Use Yes   Types: Oxycodone    Social History   Socioeconomic History   Marital status: Married    Spouse name: Not on file   Number of children: 2    Years of education: Not on file   Highest education level: Not on file  Occupational History   Occupation: EHS Freight forwarder    Employer: [employer]  Tobacco Use   Smoking status: Never   Smokeless tobacco: Never  Vaping Use   Vaping Use: Never used  Substance and Sexual Activity   Alcohol use: Not Currently    Comment: occ   Drug use: Yes    Types: Oxycodone   Sexual activity: Not on file  Other Topics Concern   Not on file  Social History Narrative   He is married with 2 sons 1 son is an Art gallery manager and 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 and 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  2020   He is Nurse, 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 [place]   Rare if any caffeine   Rare alcohol and never smoker   Social Determinants of Health   Financial Resource Strain: Low Risk  (04/10/2022)   Overall Financial Resource Strain (CARDIA)    Difficulty of Paying Living Expenses: Not hard at all  Food Insecurity: No Food Insecurity (05/14/2022)   Hunger Vital Sign    Worried About Running Out of Food in the Last Year: Never true    Ran Out of Food in the Last Year: Never true  Transportation Needs: No Transportation Needs (05/14/2022)   PRAPARE - Hydrologist (Medical): No    Lack of Transportation (Non-Medical): No  Physical Activity: Inactive (04/10/2022)   Exercise Vital Sign    Days of Exercise per Week: 0 days    Minutes of Exercise per Session: 0 min  Stress: Stress Concern Present (04/10/2022)   [place]    Feeling of Stress : To some extent  Social Connections: Not on file   Additional Social History:    Allergies:   Allergies  Allergen Reactions   Purinethol [Mercaptopurine] Other (See Comments)    Pancreatitis    Shellfish Allergy Anaphylaxis and Swelling    Can use standard SMOF lipid formulation for TPN  without any issue.    Humira [Adalimumab] Other (See Comments)    Developed antibodies   Tape Rash   Wound Dressing Adhesive Rash    Labs:  Results for orders placed or performed during the hospital encounter of 05/13/22 (from the past 48 hour(s))  Glucose, capillary     Status: None   Collection Time: 06/21/22  7:44 PM  Result Value Ref Range   Glucose-Capillary 92 70 - 99 mg/dL    Comment: Glucose reference range applies only to samples taken after fasting for at least 8 hours.  Magnesium     Status: None   Collection Time: 06/22/22  4:25 AM  Result Value Ref Range   Magnesium 2.1 1.7 - 2.4 mg/dL    Comment: Performed at [facility], [place] [address], [place], [place] [address]  Phosphorus     Status: None   Collection Time: 06/22/22  4:25 AM  Result Value Ref Range   Phosphorus 3.5 2.5 - 4.6 mg/dL    Comment: Performed at [facility], [place] [address], [place], [place] [address]  Triglycerides     Status: None   Collection Time: 06/22/22  4:25 AM  Result Value Ref Range   Triglycerides 74 <150 mg/dL    Comment: Performed at [facility], [place] [address], [place], [place] [address]  Comprehensive metabolic panel     Status: Abnormal   Collection Time: 06/22/22  4:25 AM  Result Value Ref Range   Sodium 140 135 - 145 mmol/L   Potassium 4.0 3.5 - 5.1 mmol/L   Chloride 108 98 - 111 mmol/L   CO2 25 22 - 32 mmol/L   Glucose, Bld 94 70 - 99 mg/dL    Comment: Glucose reference range applies only to samples taken after fasting for at least 8 hours.   BUN 33 (H) 8 - 23 mg/dL   Creatinine, Ser 0.87 0.61 - 1.24 mg/dL   Calcium 8.2 (L) 8.9 - 10.3 mg/dL   Total Protein 6.2 (L) 6.5 - 8.1 g/dL   Albumin 2.6 (L) 3.5 -  5.0 g/dL   AST 35 15 - 41 U/L   ALT 32 0 - 44 U/L   Alkaline Phosphatase 95 38 - 126 U/L   Total Bilirubin 0.5 0.3 - 1.2 mg/dL   GFR, Estimated >60 >60 mL/min    Comment: (NOTE) Calculated using the CKD-EPI  Creatinine Equation (2021)    Anion gap 7 5 - 15    Comment: Performed at [facility], [place] [address], [place], [place] [address]  Glucose, capillary     Status: None   Collection Time: 06/22/22  9:02 AM  Result Value Ref Range   Glucose-Capillary 93 70 - 99 mg/dL    Comment: Glucose reference range applies only to samples taken after fasting for at least 8 hours.  Glucose, capillary     Status: None   Collection Time: 06/22/22  8:24 PM  Result Value Ref Range   Glucose-Capillary 89 70 - 99 mg/dL    Comment: Glucose reference range applies only to samples taken after fasting for at least 8 hours.  Comprehensive metabolic panel     Status: Abnormal   Collection Time: 06/23/22  4:22 AM  Result Value Ref Range   Sodium 137 135 - 145 mmol/L   Potassium 4.0 3.5 - 5.1 mmol/L   Chloride 106 98 - 111 mmol/L   CO2 26 22 - 32 mmol/L   Glucose, Bld 102 (H) 70 - 99 mg/dL    Comment: Glucose reference range applies only to samples taken after fasting for at least 8 hours.   BUN 28 (H) 8 - 23 mg/dL   Creatinine, Ser 0.83 0.61 - 1.24 mg/dL   Calcium 8.2 (L) 8.9 - 10.3 mg/dL   Total Protein 5.7 (L) 6.5 - 8.1 g/dL   Albumin 2.6 (L) 3.5 - 5.0 g/dL   AST 33 15 - 41 U/L   ALT 31 0 - 44 U/L   Alkaline Phosphatase 91 38 - 126 U/L   Total Bilirubin 0.6 0.3 - 1.2 mg/dL   GFR, Estimated >60 >60 mL/min    Comment: (NOTE) Calculated using the CKD-EPI Creatinine Equation (2021)    Anion gap 5 5 - 15    Comment: Performed at [facility], [place] [address] [place], [place] [address]  Glucose, capillary     Status: Abnormal   Collection Time: 06/23/22  7:51 AM  Result Value Ref Range   Glucose-Capillary 111 (H) 70 - 99 mg/dL    Comment: Glucose reference range applies only to samples taken after fasting for at least 8 hours.    Current Facility-Administered Medications  Medication Dose Route Frequency Provider Last Rate Last Admin   0.9 %  sodium chloride  infusion   Intravenous PRN Lavina Hamman, MD   Stopped at 06/17/22 0505   acetaminophen (TYLENOL) tablet 650 mg  650 mg Oral Q6H PRN Tawni Millers, MD   650 mg at 06/23/22 0935   benzonatate (TESSALON) capsule 100 mg  100 mg Oral TID PRN Eulogio Bear U, DO   100 mg at 06/17/22 0203   busPIRone (BUSPAR) tablet 5 mg  5 mg Oral BID Eugenie Filler, MD   5 mg at 06/23/22 2641   Chlorhexidine Gluconate Cloth 2 % PADS 6 each  6 each Topical Daily Cherene Altes, MD   6 each at 06/23/22 1101   cyanocobalamin (VITAMIN B12) tablet 1,000 mcg  1,000 mcg Oral Daily Lavina Hamman, MD   1,000 mcg at 06/23/22 [phone]  enoxaparin (LOVENOX) injection 40 mg  40 mg Subcutaneous Q24H Joette Catching T, MD   40 mg at 06/15/22 2040   feeding supplement (ENSURE ENLIVE / ENSURE PLUS) liquid 237 mL  237 mL Oral Q24H Tawni Millers, MD   237 mL at 06/22/22 1537   finasteride (PROSCAR) tablet 5 mg  5 mg Oral Daily Lavina Hamman, MD   5 mg at 06/23/22 0936   gabapentin (NEURONTIN) capsule 100 mg  100 mg Oral BID Lavina Hamman, MD   100 mg at 06/23/22 4315   hydrOXYzine (ATARAX) tablet 25 mg  25 mg Oral TID PRN Tawni Millers, MD   25 mg at 06/22/22 2244   hyoscyamine (LEVSIN SL) SL tablet 0.125 mg  0.125 mg Sublingual Q6H PRN Lavina Hamman, MD   0.125 mg at 06/23/22 0615   insulin aspart (novoLOG) injection 0-9 Units  0-9 Units Subcutaneous 2 times per day Emiliano Dyer, [facility]   1 Units at 06/18/22 2142   lidocaine (LIDODERM) 5 % 1 patch  1 patch Transdermal Q24H Cristal Generous, NP   1 patch at 06/19/22 1614   methocarbamol (ROBAXIN) tablet 500 mg  500 mg Oral Q8H PRN Wendee Beavers T, MD   500 mg at 06/23/22 0936   morphine (MSIR) tablet 15 mg  15 mg Oral Q3H PRN Lavina Hamman, MD   15 mg at 06/23/22 0615   ondansetron (ZOFRAN) injection 4 mg  4 mg Intravenous Q6H PRN Lavina Hamman, MD   4 mg at 06/19/22 0956   Or   ondansetron (ZOFRAN) tablet 4 mg  4 mg Oral Q6H PRN Lavina Hamman, MD   4 mg at 06/22/22 1753   Oral care mouth rinse  15 mL Mouth Rinse PRN Candee Furbish, MD   15 mL at 06/09/22 0805   pantoprazole (PROTONIX) EC tablet 40 mg  40 mg Oral BID AC Lavina Hamman, MD   40 mg at 06/23/22 0935   psyllium (HYDROCIL/METAMUCIL) 1 packet  1 packet Oral Daily PRN Lavina Hamman, MD       sertraline (ZOLOFT) tablet 125 mg  125 mg Oral Daily Suella Broad, FNP   125 mg at 06/22/22 1252   simethicone (MYLICON) chewable tablet 80 mg  80 mg Oral TID WC & HS Eugenie Filler, MD   80 mg at 06/23/22 0935   sodium chloride flush (NS) 0.9 % injection 10-40 mL  10-40 mL Intracatheter PRN Cherene Altes, MD   20 mL at 06/12/22 1748   tamsulosin (FLOMAX) capsule 0.4 mg  0.4 mg Oral QPC supper Eugenie Filler, MD   0.4 mg at 40/08/67 6195   TPN CYCLIC-ADULT (ION)   Intravenous Cyclic-See Admin Instructions Mercy Riding, MD        Musculoskeletal: Strength & Muscle Tone: Laying in bed   Gait & Station: Laying in bed   Patient leans: Laying in bed    Psychiatric Specialty Exam: Psychiatric Specialty Exam: Physical Exam Vitals and nursing note reviewed.  Constitutional:      Appearance: Normal appearance. He is normal weight.  Skin:    General: Skin is warm and dry.     Capillary Refill: Capillary refill takes less than 2 seconds.  Neurological:     General: No focal deficit present.     Mental Status: He is alert and oriented to person, place, and time. Mental status is at baseline.  Psychiatric:  Attention and Perception: Attention and perception normal.        Mood and Affect: Affect normal. Mood is anxious and depressed.        Speech: Speech normal.        Behavior: Behavior normal. Behavior is cooperative.        Thought Content: Thought content normal.        Cognition and Memory: Cognition normal.        Judgment: Judgment normal.     Review of Systems  Psychiatric/Behavioral:  Positive for depression. Negative for  hallucinations, substance abuse and suicidal ideas (denies). The patient is nervous/anxious. The patient does not have insomnia.   All other systems reviewed and are negative.   Blood pressure 108/60, pulse 66, temperature (!) 97.4 F (36.3 C), temperature source Oral, resp. rate 17, height 5' 10" (1.778 m), weight 70.9 kg, SpO2 98 %.Body mass index is 22.41 kg/m.  General Appearance: Fairly Groomed  Eye Contact:  Good  Speech:  Clear and Coherent and Normal Rate  Volume:  Normal  Mood:  Anxious and Depressed  Affect:  Appropriate and Congruent  Thought Process:  Coherent, Goal Directed, Linear, and Descriptions of Associations: Intact  Orientation:  Full (Time, Place, and Person)  Thought Content:  Logical  Suicidal Thoughts:  No  Homicidal Thoughts:  No  Memory:  Immediate;   Fair Recent;   Fair  Judgement:  Fair  Insight:  Fair  Psychomotor Activity:  Normal  Concentration:  Concentration: Fair and Attention Span: Fair  Recall:  [employer] of Knowledge:  Good  Language:  Good  Akathisia:  No  Handed:  Right  AIMS (if indicated):     Assets:  Communication Skills Desire for Improvement Financial Resources/Insurance Leisure Time Resilience Social Support  ADL's:  Intact  Cognition:  WNL  Sleep:         Physical Exam: Physical Exam Vitals and nursing note reviewed.  Constitutional:      Appearance: Normal appearance. He is normal weight.  Skin:    General: Skin is warm and dry.     Capillary Refill: Capillary refill takes less than 2 seconds.  Neurological:     General: No focal deficit present.     Mental Status: He is alert and oriented to person, place, and time. Mental status is at baseline.  Psychiatric:        Attention and Perception: Attention and perception normal.        Mood and Affect: Affect normal. Mood is anxious and depressed.        Speech: Speech normal.        Behavior: Behavior normal. Behavior is cooperative.        Thought Content:  Thought content normal.        Cognition and Memory: Cognition normal.        Judgment: Judgment normal.    Review of Systems  Psychiatric/Behavioral:  Positive for depression. Negative for hallucinations, substance abuse and suicidal ideas (denies). The patient is nervous/anxious. The patient does not have insomnia.   All other systems reviewed and are negative.   Blood pressure 108/60, pulse 66, temperature (!) 97.4 F (36.3 C), temperature source Oral, resp. rate 17, height 5' 10" (1.778 m), weight 70.9 kg, SpO2 98 %. Body mass index is 22.41 kg/m.  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 Has continued to deny suicidality since his attempt.  -Will begin safety planning, discharge planning, referral for partial hospitalization programming versus intensive outpatient programming.  Patient is open to either option, while PHP will be more suitable to prevent further readmission into the hospital. Intake  06/26/2022 @ 8:30am [facility] on [address]. Acceptance confirmation received from [name] at [phone]. Medical records have been faxed over to [phone]. -Continue sertraline 125 mg p.o. daily.   Disposition: No evidence of imminent risk to self or others at present.   Patient does not meet criteria for psychiatric inpatient admission. Supportive therapy provided about ongoing stressors. Refer to IOP. Discussed crisis plan, support from social network, calling 911, coming to the Emergency Department, and calling Suicide Hotline. Suella Broad, FNP 06/23/2022 11:02 AM

## 2022-06-23 NOTE — Progress Notes (Signed)
Pt alert and oriented at this time. No questions regarding discharge instructions. Waiting for IV nurse to flush and cap PICC line for home use.

## 2022-06-23 NOTE — Progress Notes (Signed)
PHARMACY - TOTAL PARENTERAL NUTRITION CONSULT NOTE   Indication: Crohn's disease and complex past surgical history including ex lap with small bowel resection, lysis of adhesion, mesh explantation, and diverting loop ileostomy in April 2023, on TPN prior to admission  Patient Measurements: Height: 5' 10"  (177.8 cm) Weight: 70.9 kg (156 lb 3.2 oz) IBW/kg (Calculated) : 73 TPN AdjBW (KG): 68.1 Body mass index is 22.41 kg/m.  Assessment: Patient with PMH of Crohn's disease and complex surgical history as above on chronic TPN who presented on 05/13/22 with altered mental status after spouse found patient unresponsive with multiple pill bottles open around him. Patient was transported from SNF to ED the day prior for generalized weakness and acute worsening of his chronic abdominal pain. Patient decided to return home instead of his SNF. Pharmacy consulted to resume TPN as inpatient.   Discharge from [facility] 04/19/22:  TPN pharmacist at [facility]:  Estimated Nutrient Requirements: Protein: 1.5 - 1.7 g/kg/day (98 - 104 g/day), Total kcal: 30 - 35 kcal/kg/day (1830 - 1983 kcal/day)  TPN Formulation at [facility] per TPN pharmacist: [A 6.6%, D 20%, F 3.1%] @ goal rate, 65 mL/hr (TV: 1560 mL/day) provides: Protein 1.7 g/kg/day 103 g/day, Total kcal 32 kcal/kg/day 1971 kcal/day  NPC: N ratio 94   Admitted at [facility] 04/29/22-05/03/22: Nutritional Goals: TPN 80 mL/hr (provides 113 g of protein and 1943 kcals per day) RD Assessment: (10/16): 2100-2450kcal (30-35kcal/kg), Protein 105-130g (1.5-1.8g/kg) Utilize intralipid rather than SMOF as 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  [facility]: [name], PharmD, ph: [phone] 10/28 Most recent formula: Total volume 1560 mL:  Dextrose 220 g/day, Plenamine 120 g/day, SMOFlipid 50 g/day, NaCl 75 mEq, CaGluc 6 mEq, Potassium Ac 20 mEq, Potassium Phos 13.5 nM, Mag Sulfate 8 mEq, Vitamins/trace  elements   GI issues: Crohn's dz with chronic TPN, aspiration, dysphagia (followed by speech), severe PCM, ileostomy leakage  - PICC line placed in September 24 with the plan to continue TPN until the patient gains 30 to 40 pounds and then consider ileostomy closure.  Glucose / Insulin: No hx DM.  - On sSSI (has not used any insulin in the past 24 hrs) -  CBGs at goal <150: wnl Electrolytes: Lytes wnl including CorrCa  Renal: SCr ok; BUN slightly elevated Hepatic: LFTs andTbili WNL - Albumin low 2.6 - TG 109 (11/23), 100 (11/27), 84 (12/4), 74 (12/7) I/O: +1910 mL - ileostomy output: 600  mL GI Imaging: - 10/14 CTa/p: previous surgical changes noted (transverse-sigmoid anastomosis) with stable inflammation since 10/12 CT - 10/27 CTa/p: no significant interval change since 10/14 CT. No evidence of bowel obstruction or other complicating feature at the anastomotic site or ostomy. - 11/6 abd [place] - normal liver (checked d/t elevated LFTs) - 11/20 CT: Same postoperative changes. Inflammatory change in the right lower quadrant is not significantly changed since 05/12/2022. New patchy opacity in the right lung base. GI Surgeries / Procedures: n/a  Central access: prior to admission - double lumen CVC (03/2022, 130lbs) TPN start date: started PTA  Nutritional Goals: TPN provides 133 g of protein and 2204  kcals per day.  RD Assessment this admission:    Estimated Needs Total Energy Estimated Needs: 2015-2350kcal Total Protein Estimated Needs: 100-135g Total Fluid Estimated Needs: 2464m (Holliday Segar)  Current Nutrition:  TPN Dysphagia-3 11/9 >> Regular 11/17 >>  Dysphagia 3 11/21 >>   Plan:  Continue Cyclic TPN at 20300ml  over 14 hrs Will provide 133 g AA, 26.13% lipids, 2204 kcal GIR 3-6.1 Electrolytes in TPN:  Na 75 mEq/L K 40 mEq/L Ca 5 mEq/L Mg 5 mEq/L Phos 15 mmol/L Cl:Ac = 1:1 Add standard MVI and trace elements to TPN  With excellent CBG control, will limit finger  sticks to twice daily.  Timed 2 hrs after TPN start, 1 hour after TPN stop  Monitor TPN labs on Mon/Thurs at minimum and PRN MD ordered CMET daily    Royetta Asal, PharmD, BCPS 06/23/2022 9:49 AM

## 2022-06-23 NOTE — Discharge Summary (Signed)
Physician Discharge Summary  [name]:[id number] DOB: 06-27-1951 DOA: 05/13/2022  PCP: [name], MD  Admit date: 05/13/2022 Discharge date: 06/23/2022 Admitted From: Home Disposition: Home with sister in [place] Recommendations for Outpatient Follow-up:  Follow up with PCP in 1 to 2 weeks Outpatient follow-up with psychiatry on 06/26/2022 at 8:30 AM Outpatient follow-up with urology in 2 to 3 weeks Check CBC and CMP 1 to 2 weeks Please follow up on the following pending results: None  Home Health: [facility] RN Equipment/Devices: None  Discharge Condition: Stable CODE STATUS: Full code  Follow-up Information     [name], MD. Schedule an appointment as soon as possible for a visit in 1 week(s).   Specialty: Family Medicine Contact information: [name] [address] [phone]         [facility] Follow up.   Why: providing TPN supplies Contact information: contact person: [name] @ [facility] Follow up.   Why: to provide home health nursing support Contact information: [phone] - office #                Hospital course 71 year old M with PMH of Crohn's disease s/p extensive surgeries including ileostomy and TPN dependence, anxiety, depression, BPH and chronic pelvic pain found unresponsive at his apartment after suicidal attempt with overdose. He was intubated due to inability to maintain airway. Eventually extubated and transferred to hospitalist service. Initially, psychiatry recommended inpatient psychiatric hospitalization. However, he persistently denied suicidal ideation since his attempt that led to his h hospitalization. Eventually, psychiatry cleared patient for outpatient follow-up as of 06/20/2022.  Per psych, he has Intake  06/26/2022 @ 8:30am [name]. Acceptance confirmation received from [name] at [phone]. Medical  records have been faxed over to [phone].  Patient to continue continue sertraline 125 mg p.o. daily.  He is also on BuSpar 5 mg.  He is discharged home with his sister in [place].  He has [facility] RN for TPN and ostomy care.  Patient's prescriptions filled at [facility] and delivered to bedside prior to discharge.  See individual problem list below for more.   Problems addressed during this hospitalization Principal Problem:   Metabolic encephalopathy Active Problems:   Chronic anemia   Acute respiratory failure with hypoxia (HCC)   Severe malnutrition (HCC)   Generalized weakness   Drug overdose, intentional, initial encounter ([place])   Chronic pain syndrome   Aspiration pneumonia ([place])    Suicidal attempt with intentional drug overdose. Major depressive disorder Severe anxiety disorder. -Denied suicidal or homicidal ideation since admission -Cleared by psychiatry for outpatient follow-up.   -Patient to go home with sister in [place] and follow-up with psychiatry outpatient as above -Continue Zoloft, BuSpar and Atarax   Acute toxic and metabolic encephalopathy: Likely combination of medication overdose as well as hypoxia from pneumonia. Head CT and MRI brain negative for any acute abnormality.  Encephalopathy resolved.   Aspiration pneumonia-in the setting of encephalopathy.  Completed antibiotic course   Dysphagia-had MBS.  SLP recommended dysphagia 3 diet    Crohn's disease: Had multiple surgeries including ileostomy.  Chronically on TPN. -Continue TPN and ileostomy care -[facility] RN on discharge   Chronic prostatitis/bladder colliculi/BPH: Seen by [name] on 10/15.  Completed 14 days of Bactrim per recommendation by urology.  Voiding without issue. -Continue Flomax and finasteride. -Follow-up with [name] for outpatient  Ileostomy leakage with irritation and dermatitis: Following up [place] [name] [facility].  Patient had a PICC line placed in  September 24 with the plan to continue TPN until the patient gains 30 to 40 pounds and then consider ileostomy closure. Patient was 130 pound at the placement of the PICC line.   -Follow-up with [name] as previously planned -Continue ileostomy care.   Chronic pelvic pain-hip and pelvic x-ray negative.  No significant finding on CT abdomen and pelvis.  Felt to be causing him some anxiety.  Started on gabapentin -Continue gabapentin and morphine.   Transient A-fib: Likely provoked.  Resolved.   Iron deficiency anemia H&H stable. Recent Labs (within last 365 days)              Recent Labs    05/16/22 0446 05/17/22 0419 05/18/22 0541 05/24/22 1044 05/25/22 0226 05/26/22 0441 05/30/22 0200 06/06/22 1012 06/07/22 0423 06/15/22 0425  HGB 10.2* 9.2* 9.2* 9.1* 9.0* 8.5* 8.1* 8.9* 8.8* 9.2*    -Monitor   Nausea and abdominal cramp: Resolved. -Continue PPI, Levsin and Zofran.   Severe protein calorie malnutrition: In the setting of chronic disease. Body mass index is 22.41 kg/m. Nutrition Problem: Severe Malnutrition Etiology: chronic illness Signs/Symptoms: percent weight loss, severe fat depletion, severe muscle depletion Percent weight loss: 8 % (in 3 months) Interventions: TPN (increase nutrients)     Vital signs Vitals:   06/22/22 0555 06/22/22 1335 06/22/22 2221 06/23/22 0605  BP: (!) 122/59 (!) 107/58 133/60 108/60  Pulse: 72 66 68 66  Temp: 97.8 F (36.6 C) 98.3 F (36.8 C) 97.6 F (36.4 C) (!) 97.4 F (36.3 C)  Resp: 17 14 17 17   Height:      Weight:      SpO2: 97% 100% 96% 98%  TempSrc: Oral   Oral  BMI (Calculated):         Discharge exam  GENERAL: No apparent distress.  Nontoxic. HEENT: MMM.  Vision and hearing grossly intact.  NECK: Supple.  No apparent JVD.  RESP:  No IWOB.  Fair aeration bilaterally. CVS:  RRR. Heart sounds normal.  ABD/GI/GU: BS+. Abd soft, NTND.  Ileostomy with normal looking stool. MSK/EXT:  Moves extremities.   Significant muscle mass and subcu fat loss. SKIN: no apparent skin lesion or wound NEURO: Awake and alert. Oriented appropriately.  No apparent focal neuro deficit. PSYCH: Calm. Normal affect.   Discharge Instructions Discharge Instructions     Call MD for:  difficulty breathing, headache or visual disturbances   Complete by: As directed    Call MD for:  extreme fatigue   Complete by: As directed    Call MD for:  persistant nausea and vomiting   Complete by: As directed    Call MD for:  severe uncontrolled pain   Complete by: As directed    Call MD for:  temperature >100.4   Complete by: As directed    Diet general   Complete by: As directed    Dysphagia 3 diet   Increase activity slowly   Complete by: As directed    No wound care   Complete by: As directed       Allergies as of 06/23/2022       Reactions   Purinethol [mercaptopurine] Other (See Comments)   Pancreatitis   Shellfish Allergy Anaphylaxis, Swelling   Can use standard SMOF lipid formulation for TPN without any issue.    Humira [adalimumab] Other (See Comments)   Developed antibodies  Tape Rash   Wound Dressing Adhesive Rash        Medication List     STOP taking these medications    omeprazole 20 MG capsule Commonly known as: PRILOSEC Replaced by: pantoprazole 40 MG tablet   OVER THE COUNTER MEDICATION   sulfamethoxazole-trimethoprim 800-160 MG tablet Commonly known as: BACTRIM DS   traZODone 50 MG tablet Commonly known as: DESYREL       TAKE these medications    acetaminophen 325 MG tablet Commonly known as: TYLENOL Take 650 mg by mouth every 6 (six) hours as needed for moderate pain.   albuterol 108 (90 Base) MCG/ACT inhaler Commonly known as: VENTOLIN HFA Inhale 2 puffs into the lungs every 6 (six) hours as needed for wheezing or shortness of breath. What changed: when to take this   busPIRone 5 MG tablet Commonly known as: BUSPAR Take 1 tablet (5 mg total) by mouth 3 (three)  times daily.   calcium carbonate 500 MG chewable tablet Commonly known as: TUMS - dosed in mg elemental calcium Chew 500 mg by mouth 3 (three) times daily as needed for indigestion or heartburn.   CLINIMIX E/DEXTROSE (5/15) IV Inject 65 mL/hr into the vein continuous. Via powerline TPN IV (104gm aa/183 GM DEC/50GM)   feeding supplement Liqd Take 237 mLs by mouth daily.   finasteride 5 MG tablet Commonly known as: PROSCAR Take 1 tablet (5 mg total) by mouth daily.   hydrOXYzine 25 MG tablet Commonly known as: ATARAX Take 1 tablet (25 mg total) by mouth 3 (three) times daily as needed for anxiety.   hyoscyamine 0.125 MG SL tablet Commonly known as: LEVSIN SL Place 1 tablet (0.125 mg total) under the tongue every 6 (six) hours as needed for up to 10 days for cramping.   melatonin 3 MG Tabs tablet Take 3 mg by mouth at bedtime.   METAMUCIL FIBER PO Take 1 packet by mouth in the morning.   morphine 15 MG tablet Commonly known as: MSIR Take 1 tablet (15 mg total) by mouth every 4 (four) hours as needed for up to 5 days for severe pain.   multivitamin with minerals Tabs tablet Take 1 tablet by mouth daily.   ondansetron 4 MG disintegrating tablet Commonly known as: ZOFRAN-ODT Dissolve 1 tablet (4 mg total) by mouth every 6 (six) hours as needed for nausea or vomiting.   oxybutynin 5 MG tablet Commonly known as: DITROPAN Take 1 tablet (5 mg total) by mouth 3 (three) times daily.   pantoprazole 40 MG tablet Commonly known as: PROTONIX Take 1 tablet (40 mg total) by mouth 2 (two) times daily before a meal. Replaces: omeprazole 20 MG capsule   sertraline 25 MG tablet Commonly known as: ZOLOFT Take 5 tablets (125 mg total) by mouth daily.   simethicone 80 MG chewable tablet Commonly known as: MYLICON Chew 80 mg by mouth 4 (four) times daily -  with meals and at bedtime.   tamsulosin 0.4 MG Caps capsule Commonly known as: FLOMAX Take 1 capsule (0.4 mg total) by mouth  daily after supper.        Consultations: Urology Pulmonology Psychiatry  Procedures/Studies: Intubation and extubation   DG Swallowing Func-Speech Pathology  Result Date: 06/07/2022 Table formatting from the original result was not included. Objective Swallowing Evaluation: Type of Study: MBS-Modified Barium Swallow Study  Patient Details Name: ADRIENE PADULA MRN: [id number] Date of Birth: [birth year] Today's Date: 06/07/2022 Time: SLP Start Time (ACUTE ONLY): 0815 -SLP Stop Time (  ACUTE ONLY): 3545 SLP Time Calculation (min) (ACUTE ONLY): 40 min Past Medical History: Past Medical History: Diagnosis Date  Acute prostatitis 07/24/2007  Qualifier: Diagnosis of  By: [name] MD, [name]   Allergy   mild  Arthritis   osteoarthritis  Asthma   Blood transfusion without reported diagnosis   BPH (benign prostatic hypertrophy) with urinary obstruction   Crohn's ileitis ([place]) suspected 05/03/2017  Dilated aortic root ([place])   noted on echo 08/2012  Diverticulitis of colon   EPIDIDYMITIS 02/15/2010  Qualifier: Diagnosis of  By: [name] MD, [name]   GERD (gastroesophageal reflux disease)   H/O: GI bleed   Hemorrhoids   Hepatitis 1975  unknown type   HERPES SIMPLEX INFECTION 10/14/2007  Qualifier: Diagnosis of  By: [name] MD, [name]   Hiatal hernia   Ileus following gastrointestinal surgery ([place]) 12/26/2011  Long term (current) use of systemic steroids 06/18/2018  Psoriasis   sees [name] at [facility].  Recurrent ventral incisional hernia 05/10/2012  SVT (supraventricular tachycardia)   Ulcer 08/21/2013  ileal Past Surgical History: Past Surgical History: Procedure Laterality Date  BOWEL RESECTION  12/19/2011  Procedure: SMALL BOWEL RESECTION;  Surgeon: [name], MD;  Location: [facility];  Service: General;  Laterality: N/A;  with anastamosis and insertion mesh  BRONCHOSCOPY    COLON SURGERY  01/2004  x 2  COLONOSCOPY W/ BIOPSIES  04/26/2017  per [name], no polyps, benign inflammation, repeat in 5 yrs    CYSTOSCOPY    ESOPHAGOGASTRODUODENOSCOPY    HEMICOLECTOMY    left side, at [facility], diverticulitis  [place]    [phone] incisional hernia  ILEOSTOMY    ILEOSTOMY CLOSURE    INSERTION OF MESH  07/31/2012  Procedure: INSERTION OF MESH;  Surgeon: [name], MD;  Location: [facility];  Service: General;;  LAPAROTOMY  12/19/2011  Procedure: EXPLORATORY LAPAROTOMY;  Surgeon: [name], MD;  Location: [facility];  Service: General;  Laterality: N/A;  PACEMAKER IMPLANT N/A 12/01/2020  Procedure: PACEMAKER IMPLANT;  Surgeon: [name], MD;  Location: [facility];  Service: Cardiovascular;  Laterality: N/A;  PACEMAKER INSERTION Left   TONSILLECTOMY    UPPER GASTROINTESTINAL ENDOSCOPY    VASECTOMY    VENTRAL HERNIA REPAIR  07/31/2012  Procedure: HERNIA REPAIR VENTRAL ADULT;  Surgeon: [name], MD;  Location: [facility];  Service: General;  Laterality: N/A; HPI: Pt is a 71 yo male admit 10/28 after being obtunded found down 10/30 - ? polypharmacy,  tx to ICU for intubation, 10/31 waking up and following commands overnight.  He was extubated 11/1 and failed swallow screen thus SLP eval was ordered.  He also has h/o Crohns disease - complex hx including small bowel perf -surgical history, short gut- TPN.  MRI showed No acute intracranial abnormality.  2. 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  Chest imaging 11/21 concerning for right infrahilar airspace consolidation concerning  for pneumonia or aspiration.  Repeat MBS indicated due to new findings on chest imaging.  Subjective: awake in swallow function chair, cooperative  Recommendations for follow up therapy are one component of a multi-disciplinary discharge planning process, led by the attending physician.  Recommendations may be updated based on patient status, additional functional criteria and insurance authorization. Assessment /  Plan / Recommendation   06/07/2022  10:03 AM Clinical Impressions Clinical Impression Patient continues with mild oral  and moderate pharyngeal phase dysphagia with concern for sensorimotor deficits.  Swallow ability consistent with prior MBS.  Today t was assessed with thin, nectar, puree and soft solid. During oral phase, patient exhibited decreased anterior to posterior transit of boluses which was mostly noted with thicker liquids (honey thick, puree/pudding) resulting in delay and oral retention. Minimal amount of barium retained in oral cavity post initial swallow and then spilled into pharynx after the swallow without consistent reflexive swallow response or delayed response. During pharyngeal phase of swallow, patient exhibited worsened delay in swallow trigger to pyriform sinus at worst with nectar and thin liquids.  Vallecular retention -mod to severe- noted as well as minimal liquid retention in lateral channels and pyriform sinus WITHOUT pt sensation.  He exhibited aspiration during the swallow (PAS 7) with cup sip of thin liquids and with patient reacting with a cough response that did not clear aspirates.  Various postures including chin tuck with and without head turn right/left and cued expectorate were not helpful to mitigate retention/aspiration. In addition, pt with very limited neck ROM.  With teaspoon sips of thin liquids, patient did not exhibit any instances of penetration or aspiration during or after the swallow, but did continue with pharyngeal residuals. Penetration and aspiration of thin liquid was inconsistent - thus recommend conservative diet at this time.  SLP is recommending to modify diet to dys3/nectar and allow tsps of thin liquid any time. [place] water protocol advised to help pt with hydration given his colectomy.  Pt should be fully upright for all intake - but he is not sitting fully up with meals despite educated to importance.  SLP will continue to follow patient for diet  toleration and strengthening of pharyngeal musculature to maximize swallow rehab. Pt denies significant issues with swallowing prior to admit - and now reports awareness to his changes with swallow function.  He only took very small bites/sips during testing - unless cued to larger bolus - and his po nutrition has remained poor despite dietary advancement.  SLP is concern that his deconditioning from his lengthy hospital coarse is diminishing his tolerance of likely low grade chronic aspiration.  Educated pt thoroughly during MBS to recommendations and provided written instructions. Upon esophageal sweep, pt appeared with barium stasis of liquids  with appearance of minimal backflow.  Pudding bolus aided clearance of liquid into stomach. Suspect component of dysmotility.  SLP Visit Diagnosis Dysphagia, oropharyngeal phase (R13.12) Impact on safety and function Mild aspiration risk;Risk for inadequate nutrition/hydration     06/07/2022  10:03 AM Treatment Recommendations Treatment Recommendations Therapy as outlined in treatment plan below     06/07/2022  10:18 AM Prognosis Prognosis for Safe Diet Advancement Fair Barriers to Reach Goals Time post onset;Severity of deficits;Motivation   06/07/2022  10:03 AM Diet Recommendations SLP Diet Recommendations Dysphagia 3 (Mech soft) solids;No mixed consistencies;Nectar thick liquid;Free water protocol after oral care Liquid Administration via Spoon;Cup;No straw Medication Administration -- Compensations Slow rate;Small sips/bites;Other (Comment) Postural Changes Remain semi-upright after after feeds/meals (Comment);Seated upright at 90 degrees     06/07/2022  10:03 AM Other Recommendations Oral Care Recommendations Oral care BID;Staff/trained caregiver to provide oral care;Oral care before and after PO Follow Up Recommendations Skilled nursing-short term rehab (<3 hours/day) Assistance recommended at discharge Frequent or constant Supervision/Assistance Functional Status  Assessment Patient has had a recent decline in their functional status and demonstrates the ability to make significant improvements in function in a reasonable and predictable amount of time.   06/07/2022  10:03 AM Frequency and Duration  Speech Therapy Frequency (ACUTE ONLY) min 1 x/week Treatment Duration 1 week     06/07/2022   9:47 AM Oral Phase Oral Phase Impaired Oral - Nectar Teaspoon Weak lingual manipulation;Delayed oral transit;Lingual/palatal residue;Premature spillage;Reduced posterior propulsion Oral - Nectar Cup Weak lingual manipulation;Delayed oral transit;Lingual/palatal residue;Premature spillage;Reduced posterior propulsion Oral - Thin Teaspoon Premature spillage;Weak lingual manipulation;Lingual/palatal residue;Delayed oral transit;Reduced posterior propulsion Oral - Thin Cup Weak lingual manipulation;Premature spillage;Delayed oral transit;Lingual/palatal residue;Reduced posterior propulsion Oral - Thin Straw Weak lingual manipulation;Premature spillage;Delayed oral transit;Lingual/palatal residue;Reduced posterior propulsion Oral - Puree Weak lingual manipulation;Delayed oral transit;Lingual/palatal residue;Reduced posterior propulsion Oral - Mech Soft Weak lingual manipulation;Premature spillage;Delayed oral transit;Reduced posterior propulsion    06/07/2022   9:50 AM Pharyngeal Phase Pharyngeal Phase Impaired Pharyngeal- Honey Cup NT Pharyngeal- Nectar Teaspoon Delayed swallow initiation-vallecula;Pharyngeal residue - valleculae;Reduced pharyngeal peristalsis Pharyngeal Material does not enter airway Pharyngeal- Nectar Cup Reduced pharyngeal peristalsis;Pharyngeal residue - valleculae;Pharyngeal residue - pyriform;Lateral channel residue Pharyngeal Material enters airway, remains ABOVE vocal cords and not ejected out Pharyngeal- Thin Teaspoon Delayed swallow initiation-pyriform sinuses;Pharyngeal residue - valleculae;Compensatory strategies attempted (with notebox);Lateral channel residue  Pharyngeal Material does not enter airway Pharyngeal- Thin Cup Penetration/Aspiration during swallow;Penetration/Apiration after swallow;Reduced airway/laryngeal closure;Delayed swallow initiation-pyriform sinuses;Delayed swallow initiation-vallecula;Pharyngeal residue - valleculae;Pharyngeal residue - pyriform;Reduced pharyngeal peristalsis;Lateral channel residue Pharyngeal Material enters airway, passes BELOW cords and not ejected out despite cough attempt by patient Pharyngeal- Thin Straw Delayed swallow initiation-pyriform sinuses;Reduced pharyngeal peristalsis;Lateral channel residue;Pharyngeal residue - valleculae;Pharyngeal residue - pyriform;Reduced airway/laryngeal closure;Penetration/Aspiration during swallow;Penetration/Apiration after swallow Pharyngeal Material enters airway, passes BELOW cords without attempt by patient to eject out (silent aspiration);Material enters airway, passes BELOW cords and not ejected out despite cough attempt by patient Pharyngeal- Puree Reduced pharyngeal peristalsis;Pharyngeal residue - valleculae;Delayed swallow initiation-vallecula Pharyngeal Material does not enter airway Pharyngeal- Mechanical Soft Delayed swallow initiation-vallecula;Pharyngeal residue - valleculae Pharyngeal Comment various postures including head turn with and without chin tuck were not helpful to prevent pharyngeal retenton nor prevent aspiration; pt with limited neck ROM - that may contribute to diminished improvement with posture; pt took small bites/sips unless otherwise instructed; follow solid with nectar did not decrease retention but did not add to it; pt did not expel vallecular retention with cued "hock" nor did he cough adequately strong to clear aspirates; cued effortful swallow did not consistently improve pharyngeal clearance    06/07/2022  10:02 AM Cervical Esophageal Phase  Cervical Esophageal Phase Darryll Capers Macario Golds 06/07/2022, 10:22 AM                     CT ABDOMEN PELVIS  W CONTRAST  Result Date: 06/05/2022 CLINICAL DATA:  Crohn's disease, abdominal pain. EXAM: CT ABDOMEN AND PELVIS WITH CONTRAST TECHNIQUE: Multidetector CT imaging of the abdomen and pelvis was performed using the standard protocol following bolus administration of intravenous contrast. RADIATION DOSE REDUCTION: This exam was performed according to the departmental dose-optimization program which includes automated exposure control, adjustment of the mA and/or kV according to patient size and/or use of iterative reconstruction technique. CONTRAST:  11m OMNIPAQUE IOHEXOL 300 MG/ML  SOLN COMPARISON:  05/12/2022 FINDINGS: Lower chest: Airspace opacity in the right lung base. This could reflect developing pneumonia. No effusions. Hepatobiliary: Insert paddle biliary Pancreas: No focal abnormality or ductal dilatation. Spleen: No focal abnormality.  Normal size. Adrenals/Urinary Tract: No adrenal abnormality. No focal renal abnormality. No stones or hydronephrosis. Urinary bladder is unremarkable. Stomach/Bowel: Right lower quadrant ostomy. Prior partial colectomy. Inflammatory stranding  seen in the right lower quadrant, not significantly changed since prior study. No evidence of bowel obstruction. No visible active Crohn's disease. Vascular/Lymphatic: No evidence of aneurysm or adenopathy. Reproductive: No visible focal abnormality. Other: No free fluid or free air. Musculoskeletal: No acute bony abnormality. IMPRESSION: Postoperative changes from partial colectomy and right lower quadrant ileostomy. Surrounding inflammatory change in the right lower quadrant is not significantly changed since 05/12/2022. Patchy opacity in the right lung base, new since prior study. This could reflect developing pneumonia. Electronically Signed   By: Rolm Baptise M.D.   On: 06/05/2022 19:06   DG Abd Portable 1V  Result Date: 06/05/2022 CLINICAL DATA:  Cough, nausea EXAM: PORTABLE ABDOMEN - 1 VIEW COMPARISON:  05/16/2022  FINDINGS: Interval removal of nasogastric tube. Ostomy RIGHT lower quadrant. Nonobstructive bowel gas pattern. No bowel dilatation or bowel wall thickening. Osseous structures demineralized. Lung bases clear. IMPRESSION: No acute abnormalities. Electronically Signed   By: Lavonia Dana M.D.   On: 06/05/2022 15:07   DG CHEST PORT 1 VIEW  Result Date: 06/05/2022 CLINICAL DATA:  Cough. EXAM: PORTABLE CHEST 1 VIEW COMPARISON:  05/16/2022 FINDINGS: Endotracheal tube and NG tube have been removed in the interval. Right sided central line remains in place with tip position obscured by overlying permanent pacemaker wires. The right infrahilar airspace disease seen previously has almost completely resolved. No new focal consolidation. No pulmonary edema or pleural effusion. The cardiopericardial silhouette is within normal limits for size. The visualized bony structures of the thorax are unremarkable. IMPRESSION: Interval extubation and removal of NG tube. Interval near complete resolution of the right infrahilar airspace disease seen previously. Electronically Signed   By: Misty Stanley M.D.   On: 06/05/2022 15:06   DG HIP PORT UNILAT WITH PELVIS 1V RIGHT  Result Date: 06/03/2022 CLINICAL DATA:  Pain without trauma EXAM: DG HIP (WITH OR WITHOUT PELVIS) 1V PORT RIGHT COMPARISON:  None Available. FINDINGS: There is no evidence of hip fracture or dislocation. There is no evidence of arthropathy or other focal bone abnormality. IMPRESSION: Negative. Electronically Signed   By: Dorise Bullion III M.D.   On: 06/03/2022 13:40   DG HIP PORT UNILAT WITH PELVIS 1V LEFT  Result Date: 06/03/2022 CLINICAL DATA:  Pain without trauma. EXAM: DG HIP (WITH OR WITHOUT PELVIS) 1V PORT LEFT COMPARISON:  None Available. FINDINGS: There is no evidence of hip fracture or dislocation. There is no evidence of arthropathy or other focal bone abnormality. IMPRESSION: Negative. Electronically Signed   By: Dorise Bullion III M.D.   On:  06/03/2022 13:40       The results of significant diagnostics from this hospitalization (including imaging, microbiology, ancillary and laboratory) are listed below for reference.     Microbiology: No results found for this or any previous visit (from the past 240 hour(s)).   Labs:  CBC: No results for input(s): "WBC", "NEUTROABS", "HGB", "HCT", "MCV", "PLT" in the last 168 hours. BMP &GFR Recent Labs  Lab 06/19/22 0205 06/20/22 0213 06/21/22 0527 06/22/22 0425 06/23/22 0422  NA 140 138 138 140 137  K 3.9 3.6 4.1 4.0 4.0  CL 109 107 107 108 106  CO2 27 26 27 25 26   GLUCOSE 92 116* 103* 94 102*  BUN 28* 28* 33* 33* 28*  CREATININE 0.94 0.91 0.93 0.87 0.83  CALCIUM 8.3* 8.3* 8.2* 8.2* 8.2*  MG 2.0  --   --  2.1  --   PHOS 3.7  --   --  3.5  --  Estimated Creatinine Clearance: 84.2 mL/min (by C-G formula based on SCr of 0.83 mg/dL). Liver & Pancreas: Recent Labs  Lab 06/19/22 0205 06/20/22 0213 06/21/22 0527 06/22/22 0425 06/23/22 0422  AST 42* 35 37 35 33  ALT 37 34 34 32 31  ALKPHOS 103 95 96 95 91  BILITOT 0.3 0.4 0.5 0.5 0.6  PROT 6.0* 6.5 5.8* 6.2* 5.7*  ALBUMIN 2.6* 2.7* 2.4* 2.6* 2.6*   No results for input(s): "LIPASE", "AMYLASE" in the last 168 hours. No results for input(s): "AMMONIA" in the last 168 hours. Diabetic: No results for input(s): "HGBA1C" in the last 72 hours. Recent Labs  Lab 06/21/22 0829 06/21/22 1944 06/22/22 0902 06/22/22 2024 06/23/22 0751  GLUCAP 103* 92 93 89 111*   Cardiac Enzymes: No results for input(s): "CKTOTAL", "CKMB", "CKMBINDEX", "TROPONINI" in the last 168 hours. No results for input(s): "PROBNP" in the last 8760 hours. Coagulation Profile: No results for input(s): "INR", "PROTIME" in the last 168 hours. Thyroid Function Tests: No results for input(s): "TSH", "T4TOTAL", "FREET4", "T3FREE", "THYROIDAB" in the last 72 hours. Lipid Profile: Recent Labs    06/22/22 0425  TRIG 74   Anemia Panel: No results  for input(s): "VITAMINB12", "FOLATE", "FERRITIN", "TIBC", "IRON", "RETICCTPCT" in the last 72 hours. Urine analysis:    Component Value Date/Time   COLORURINE YELLOW 05/12/2022 1116   APPEARANCEUR CLEAR 05/12/2022 1116   LABSPEC 1.019 05/12/2022 1116   PHURINE 5.0 05/12/2022 1116   GLUCOSEU NEGATIVE 05/12/2022 1116   HGBUR NEGATIVE 05/12/2022 1116   HGBUR negative 02/15/2010 1105   [place] 05/12/2022 1116   BILIRUBINUR neg 10/04/2021 [place] 05/12/2022 1116   PROTEINUR NEGATIVE 05/12/2022 1116   UROBILINOGEN 1.0 10/04/2021 1015   UROBILINOGEN 0.2 12/19/2011 1101   NITRITE NEGATIVE 05/12/2022 1116   LEUKOCYTESUR NEGATIVE 05/12/2022 1116   Sepsis Labs: Invalid input(s): "PROCALCITONIN", "LACTICIDVEN"   SIGNED:  Mercy Riding, MD  [facility] 06/23/2022, 11:10 AM

## 2022-06-26 ENCOUNTER — Telehealth: Payer: Self-pay

## 2022-06-26 DIAGNOSIS — I495 Sick sinus syndrome: Secondary | ICD-10-CM | POA: Diagnosis not present

## 2022-06-26 LAB — CUP PACEART REMOTE DEVICE CHECK
Battery Remaining Longevity: 116 mo
Battery Remaining Percentage: 88 %
Battery Voltage: 3.01 V
Brady Statistic AP VP Percent: 1 %
Brady Statistic AP VS Percent: 12 %
Brady Statistic AS VP Percent: 2.5 %
Brady Statistic AS VS Percent: 85 %
Brady Statistic RA Percent Paced: 12 %
Brady Statistic RV Percent Paced: 2.7 %
Date Time Interrogation Session: 20231210064652
Implantable Lead Connection Status: 753985
Implantable Lead Connection Status: 753985
Implantable Lead Implant Date: 20220518
Implantable Lead Implant Date: 20220518
Implantable Lead Location: 753859
Implantable Lead Location: 753860
Implantable Lead Model: 1944
Implantable Pulse Generator Implant Date: 20220518
Lead Channel Impedance Value: 450 Ohm
Lead Channel Impedance Value: 490 Ohm
Lead Channel Pacing Threshold Amplitude: 0.375 V
Lead Channel Pacing Threshold Amplitude: 0.5 V
Lead Channel Pacing Threshold Pulse Width: 0.5 ms
Lead Channel Pacing Threshold Pulse Width: 0.5 ms
Lead Channel Sensing Intrinsic Amplitude: 3.9 mV
Lead Channel Sensing Intrinsic Amplitude: 5 mV
Lead Channel Setting Pacing Amplitude: 0.75 V
Lead Channel Setting Pacing Amplitude: 1.375
Lead Channel Setting Pacing Pulse Width: 0.5 ms
Lead Channel Setting Sensing Sensitivity: 2 mV
Pulse Gen Model: 2272
Pulse Gen Serial Number: 3925308

## 2022-06-26 NOTE — Patient Outreach (Signed)
  Care Coordination TOC Note Transition Care Management Follow-up Telephone Call Date of discharge and from where: 06/23/22-[facility];"metabolic encephalopathy" How have you been since you were released from the hospital? Spoke very briefly with patient. He reported he was not feeling well today. States he is having some unusual pain. He is staying with family in [place] currently and they are going to take him to see a doctor right now in the area.     Care Coordination Interventions:  Education provided    Encounter Outcome:  Pt. Visit Completed    Enzo Montgomery, RN,BSN,CCM [place] [facility] Telephonic Care Management Coordinator Direct Phone: [phone] Toll Free: [phone] Fax: [phone]

## 2022-07-19 NOTE — Progress Notes (Signed)
Remote pacemaker transmission.   

## 2022-08-15 NOTE — ED Provider Notes (Signed)
 [facility] EMERGENCY DEPARTMENT  ROOM: 35  CHIEF COMPLAINT   Chief Complaint  Patient presents with  . Epigastric Pain    HPI   [name] is a 72 y.o. male with past medical history of anemia, Crohn's disease, and diverticulitis presenting to the ED for epigastric pain.  The patient reports that last night, he started having abdominal spasms that would occur when he stands up. He states that this morning, the abdominal spasms started moving into his chest. He notes that he currently feels a little pressure in his chest. Endorses feeling SOB since the onset of the abdominal cramping. He also reports that he is a TPN patient. He notes that he will have his ileostomy bag reversed but must first get his weight back up since he had lost significant weight prior to this. Denies any fevers or pain with breathing. Denies any urinary symptoms.  Patient presents to the ED unaccompanied at bedside.   I have reviewed pertinent labs, imaging studies, and electronic medical records available on the patient:  I have reviewed the results of tests ordered by a provider other than myself, including:  CT abdomen pelvis on 06/05/2022 shows Postoperative changes from partial colectomy and right lower quadrant ileostomy. Surrounding inflammatory change in the right lower quadrant is not significantly changed since 05/12/2022.  I have reviewed outside medical records.  Per chart review, patient saw colorectal surgery on 08/10/22 discussing left colectomy, ileocolonic resection, and TPN. They discussed undergoing ileostomy reversal, but Dr. Derinda would like for him to go gain weight.  PCP: Kathrin Felix, MD  PAST MEDICAL HISTORY   Past Medical History:  Diagnosis Date  . Abnormal ECG August   Tachycardia  . Anal fissure    Corrected by surgery  . Anemia 12 2023   Taking iron  . Anxiety   . Asthma Inactive  . Crohn disease (CMS-HCC)   . Depression   . Difficult intravenous access May 23    Currently have a central line 2 ports  . Diverticulitis of colon 2005   Resected colon  . Pacemaker   . Postoperative wound infection August   Absess on stomach wall    SURGICAL HISTORY   Past Surgical History:  Procedure Laterality Date  . COLON SURGERY  2005   Colon rescection due to diverticulitis ilestomy and open wound  . fisher takedown  04/17/2022  . HERNIA REPAIR     2014  . illeum  11/12/2021   ileum resction and mesh removal  . incarerated bowel     2013  . INCISIONAL HERNIA REPAIR     with mesh- 2006  . LAPAROSCOPIC COLON RESECTION     2005  . SMALL INTESTINE SURGERY  2013 2023 april august   Ileium resection and mesh removal April fistula takedown August  . VASECTOMY  1998    CURRENT MEDICATIONS No current facility-administered medications for this encounter.  Current Outpatient Medications:  .   multivitamin, ferrous fumarate/minerals/FA (HEMOCYTE-PLUS) 106 mg iron- 1 mg cap, Take 1 capsule (1 each total) by mouth daily., Disp: 90 capsule, Rfl: 1 .  acetaminophen  (TYLENOL ) 325 MG tablet, Take 2 tablets (650 mg total) by mouth nightly., Disp: , Rfl:  .  busPIRone  (BUSPAR ) 5 MG tablet, Take 1 tablet (5 mg total) by mouth Three (3) times a day., Disp: , Rfl:  .  ciprofloxacin  HCl (CIPRO ) 500 MG tablet, Take 1 tablet (500 mg total) by mouth every twelve (12) hours., Disp: ,  Rfl:  .  diphenoxylate -atropine  (LOMOTIL ) 2.5-0.025 mg per tablet, Take 1 tablet by mouth Three (3) times a day. Before meals, Disp: , Rfl:  .  fexofenadine (ALLEGRA) 180 MG tablet, Take 1 tablet (180 mg total) by mouth daily., Disp: , Rfl:  .  finasteride  (PROSCAR ) 5 mg tablet, Take 1 tablet (5 mg total) by mouth daily., Disp: , Rfl:  .  hyoscyamine  (LEVSIN /SL) 0.125 mg SL tablet, Place 1 tablet (0.125 mg total) under the tongue every four (4) hours as needed for cramping. (Patient taking differently: Place 1 tablet (0.125 mg total) under the tongue nightly.), Disp: 100 tablet, Rfl: 1 .   iron,carbonyl-vitamin C (VITRON-C) 65 mg iron- 125 mg TbEC, Take 1 tablet by mouth nightly., Disp: , Rfl:  .  loperamide  (IMODIUM  A-D) 2 mg tablet, Take 1 tablet (2 mg total) by mouth Three (3) times a day with a meal. Before meals, Disp: , Rfl:  .  melatonin 3 mg Tab, Take 1 tablet (3 mg total) by mouth nightly., Disp: , Rfl:  .  ondansetron  (ZOFRAN ) 4 MG tablet, Take 1 tablet (4 mg total) by mouth every six (6) hours., Disp: 100 tablet, Rfl: 1 .  oxybutynin  (DITROPAN ) 5 MG tablet, Take 1 tablet (5 mg total) by mouth Three (3) times a day., Disp: , Rfl:  .  pantoprazole  (PROTONIX ) 40 MG tablet, Take 1 tablet (40 mg total) by mouth two (2) times a day., Disp: 90 tablet, Rfl: 3 .  simethicone  (MYLICON) 80 MG chewable tablet, Chew 1 tablet (80 mg total) every six (6) hours as needed for flatulence., Disp: , Rfl:  .  tamsulosin  (FLOMAX ) 0.4 mg capsule, Take 1 capsule (0.4 mg total) by mouth daily., Disp: , Rfl:  .  traZODone  (DESYREL ) 100 MG tablet, Take 1 tablet (100 mg total) by mouth nightly. (Patient taking differently: Take 0.5 tablets (50 mg total) by mouth nightly.), Disp: 90 tablet, Rfl: 3 .  dicyclomine (BENTYL) 20 mg tablet, Take 1 tablet (20 mg total) by mouth two (2) times a day as needed (SPASM IN STOMACH) for up to 10 days., Disp: 20 tablet, Rfl: 0 .  sterile water  SolP 484.7 mL with amino acid (CLINISOL  SF) 15% 15 % SolP 45.9 g, dextrose  70 % SolP 162.001 g, sodium acetate  2 mEq/mL Soln 21.6 mEq, sodium chloride  4 mEq/mL SolP 21.6 mEq, potassium phosphate  3 mmol/mL Soln 16.2 mmol, potassium chloride  2 mEq/mL Soln 19.44 mEq, magnesium  sulfate 4 mEq/mL (50 %) Soln 8.6478 mEq, calcium  gluconate 100 mg/mL (10%) Soln 5.0778 mEq, multivitamin, adult injection 3,300 unit- 150 mcg/10 mL Soln 10 mL, trace elements (TRALEMENT) Zn-Cu-Mn-Se 3 mg-0.3 mg-55 mcg-60 mcg/mL Soln 1 mL, chromium chloride 4 mcg/mL Soln 10 mcg, Infuse into a venous catheter. Taper up for   Hours. Taper down for   Hours., Disp: ,  Rfl:   ALLERGIES   Allergies  Allergen Reactions  . Mercaptopurine  Analogues (Thiopurines)   . Shellfish Containing Products Anaphylaxis  . Wound Dressings Rash  . Humira  [Adalimumab ]     FAMILY HISTORY   Family History  Problem Relation Age of Onset  . Lung cancer Mother   . Alcohol abuse Mother   . Cancer Mother        Lung  . Leukemia Father   . Cancer Father        Leukemia    SOCIAL HISTORY   Social History   Socioeconomic History  . Marital status: Married    Spouse name: Katrina Development worker, international aid  .  Number of children: 2  Tobacco Use  . Smoking status: Never  . Smokeless tobacco: Never  Vaping Use  . Vaping Use: Never used  Substance and Sexual Activity  . Alcohol use: Yes    Alcohol/week: 1.0 standard drink of alcohol    Types: 1 Cans of beer per week    Comment: 1 drink oer year  . Drug use: Never  . Sexual activity: Not Currently    Partners: Female    Birth control/protection: None   Social Determinants of Health   Financial Resource Strain: Low Risk  (06/26/2022)   Overall Financial Resource Strain (CARDIA)   . Difficulty of Paying Living Expenses: Not hard at all  Food Insecurity: No Food Insecurity (08/07/2022)   Hunger Vital Sign   . Worried About Programme researcher, broadcasting/film/video in the Last Year: Never true   . Ran Out of Food in the Last Year: Never true  Transportation Needs: No Transportation Needs (08/07/2022)   PRAPARE - Transportation   . Lack of Transportation (Medical): No   . Lack of Transportation (Non-Medical): No    PHYSICAL EXAM   VITAL SIGNS:  Vitals:   08/15/22 1253 08/15/22 1610  BP: 93/59 113/59  Pulse: 68 67  Resp: 18 16  Temp: 36.4 C (97.6 F)   TempSrc: Oral   SpO2: 99% 98%    Constitutional: Norleen Curly Repine is alert and in no distress.  OP: clear Eyes: Conjunctivae are normal. No scleral icterus.  Neck: No stiffness or rigidity,  No thyromegaly present.  Cardiovascular: Normal rate, equal pulses bilaterally.   No murmur  heard. Pulmonary/Chest: breath sounds normal. Abdominal: Ileostomy. Soft. No tenderness. No masses, and no hepatosplenomegaly. Musculoskeletal:  No edema and no tenderness.  Neurological: The patient is Alert and Oriented at baseline.  No focal neuro appreciated.  Skin: Skin is warm and dry. No rash noted.  Psychiatric: the patient has a normal mood and affect.   RELEVANT LAB DATA Interpretation of remarkable lab data:  BUN of 29, hemoglobin 12.4.  Labs Reviewed  COMPREHENSIVE METABOLIC PANEL - Abnormal; Notable for the following components:      Result Value   BUN 29 (*)    Albumin  3.0 (*)    AST 38 (*)    All other components within normal limits  CBC W/ AUTO DIFF - Abnormal; Notable for the following components:   HGB 12.4 (*)    HCT 38.5 (*)    RDW 20.5 (*)    Anisocytosis Marked (*)    All other components within normal limits  LIPASE - Normal  MAGNESIUM  - Normal  CBC W/ DIFFERENTIAL   Narrative:    The following orders were created for panel order CBC w/ Differential.                 Procedure                               Abnormality         Status                                    ---------                               -----------         ------  CBC w/ Differential[[id number]]         Abnormal            Final result                                               Please view results for these tests on the individual orders.     EKG Interpretation   Encounter Date: 08/15/22  ECG 12 Lead  Result Value   EKG Systolic BP    EKG Diastolic BP    EKG Ventricular Rate 72   EKG Atrial Rate 72   EKG P-R Interval 138   EKG QRS Duration 108   EKG Q-T Interval 432   EKG QTC Calculation 473   EKG Calculated P Axis    EKG Calculated R Axis -121   EKG Calculated T Axis 105   QTC Fredericia 459   Narrative   NORMAL SINUS RHYTHM RIGHT SUPERIOR AXIS DEVIATION INCOMPLETE RIGHT BUNDLE BRANCH BLOCK POSSIBLE RIGHT VENTRICULAR  HYPERTROPHY ABNORMAL ECG WHEN COMPARED WITH ECG OF 31-Jan-2004 16:59, VENT. RATE HAS DECREASED BY 142 BPM INCOMPLETE RIGHT BUNDLE BRANCH BLOCK IS NOW PRESENT Confirmed by Baruch Motto [phone]) on 08/15/2022 1:29:47 PM     RADIOLOGY/PROCEDURES   CT Chest Abdomen Pelvis W Contrast  Result Date: 08/15/2022 Exam:  CT of the Chest (Pulmonary Embolism Protocol) and Abdomen and Pelvis with Contrast  History:  Epigastric and lower abdominal pain. Lower chest pain Technique: Chest CT with contrast (pulmonary embolism protocol). This examination was specifically tailored to evaluate the pulmonary arteries for the presence of intraluminal thrombus.  Routine abdomen and pelvis CT with IV contrast.  AEC (automated exposure control) and/or manual techniques such as size-specific kV and mAs are employed where appropriate to reduce radiation exposure for all CT exams. Comparison:  CT abdomen pelvis 06/05/2022 Chest CT Findings: PULMONARY ARTERIES:  There is appropriate enhancement of the central pulmonary arteries. There is no evidence of acute pulmonary embolism. CARDIAC/MEDIASTINUM:  Left-sided pulse generator with leads in the right atrial appendage and right ventricle. Right internal jugular catheter tip in the right atrium. Heart size is normal. Aortic and coronary artery calcifications.   No mediastinal or hilar adenopathy. LUNGS/PLEURA:  The central airways are patent. No focal pulmonary consolidation or suspicious nodule. Patchy areas of scarring or atelectasis in the right lower lobe, with near complete resolution of previously seen patchy opacity.  No pleural effusion or pneumothorax. BONES/SOFT TISSUES:  The regional bones and soft tissues appear grossly normal for age. Abdomen and Pelvis CT Findings: HEPATOBILIARY:  No suspicious hepatic mass. Normal gallbladder. No biliary duct dilation. PANCREAS:  Normal. SPLEEN:  Normal. ADRENALS:  Normal. KIDNEYS/URETERS:  Normal. VASCULAR:  The aorta is not dilated.  Atherosclerotic calcifications. LYMPH NODES:  No adenopathy. BOWEL/MESENTERY:  Prior subtotal colectomy with right abdominal loop ileostomy. Mild haziness of the intra-abdominal fat. No organized fluid collection. PELVIC ORGANS:  Enlarged prostate which contains some dystrophic calcifications. BONES/SOFT TISSUES:  Postprocedural changes in the anterior abdominal wall.   No pulmonary embolism. Prior subtotal colectomy with right abdominal loop ileostomy. Similar haziness of the intra-abdominal fat. No drainable fluid collection. Signed (Electronic Signature): 08/15/2022 3:46 PM Signed By: Kristopher Tantillo  XR Chest Portable  Result Date: 08/15/2022 Exam:  Portable Chest History:  Line placement. Technique:  Single frontal view. Comparison:  None. Findings:   Patient rotated to  the right. New right PICC line in place with tip in the right atrium. Sided bipolar pacer is stable. Lungs are clear. No effusions. No pneumothorax. No bony abnormalities.   1. New right PICC line placement. Tip in the right atrium. Signed (Electronic Signature): 08/15/2022 1:59 PM Signed By: Lindy Harriette RADDLE, MD  ECG 12 Lead  Result Date: 08/15/2022 NORMAL SINUS RHYTHM RIGHT SUPERIOR AXIS DEVIATION INCOMPLETE RIGHT BUNDLE BRANCH BLOCK POSSIBLE RIGHT VENTRICULAR HYPERTROPHY ABNORMAL ECG WHEN COMPARED WITH ECG OF 31-Jan-2004 16:59, VENT. RATE HAS DECREASED BY 142 BPM INCOMPLETE RIGHT BUNDLE BRANCH BLOCK IS NOW PRESENT Confirmed by Baruch Motto [phone]) on 08/15/2022 1:29:47 PM  I have personally reviewed the X-ray.   My interpretation is normal PICC line.   ED COURSE:  The following independent historian(s) was required: none  The patient has the following social determinants: none   MEDICAL DECISION MAKING: Neizan Debruhl is a 71 y.o. male with past medical history of anemia, Crohn's disease, and diverticulitis presenting to the ED for epigastric cramping that he states started in his abdomen into his chest this morning.  He notes feeling SOB. Vital signs are notable for patient being hypotensive (93/59). Physical exam notable for patient having ileostomy. I ordered labs, CT chest abdomen pelvis, and an EKG to evaluate for any acute processes.  Lipase is normal. No evidence of pancreatitis. Labs are otherwise within normal limits.   The history, physical exam and evaluations/ studies do not show evidence of acute abdominal pathology. Repeat examinations of the abdomen showed no tenderness or progression of symptoms to suggest acute abdominal pathologies.  Certainly this could change if the symptoms are different or progress after being discharged. CT discussion,findings, treatments  Education and followup plans were discussed accordingly. Will follow up witih his surgeon.  Would like to try Bentyl.    The patient received the following medications during ED stay: Medications  sodium chloride  0.9% (NS) bolus 1,000 mL (0 mL Intravenous Stopped 08/15/22 1458)  ondansetron  (ZOFRAN ) injection 4 mg (4 mg Intravenous Given 08/15/22 1357)  iohexol  (OMNIPAQUE ) 350 mg iodine/mL solution 100 mL (100 mL Intravenous Given 08/15/22 1517)  heparin  preservative-free injection 10 units/mL syringe (HEPARIN  LOCK FLUSH) (50 Units Intravenous Given 08/15/22 1620)     FINAL CLINICAL IMPRESSIONS:  1. Abdominal pain, unspecified abdominal location      DISPOSITION:  Admission and/or additional testing was considered and discussed with patient, appropriate final disposition was: DC    Evaluation and diagnostic testing in the emergency department does not suggest an emergent condition requiring admission or immediate intervention beyond what has been performed at this time.  The patient is safe for discharge has been instructed to return immediately for worsening symptoms, change in his symptoms or any other concerns.  Chart created using scribe services, reviewed, amended and signed by Motto Baruch, MD  Disclaimer: This chart has been  created using Occupational hygienist. Chart creation errors have been sought, but may not always be located and corrected and such creation errors do NOT reflect on the standard of medical care.   Scribe Disclaimer: This document serves as a record of the services and decisions personally performed and made by Motto Baruch, MD. It was created on his behalf by Tonna Aus, a trained medical scribe. The creation of this document is based on the provider's statements to the medical scribe.   Provider attestation: The information in this document, created by the medical scribe for me, accurately reflects the services I personally performed  and the decisions made by me. I have reviewed and approved this document for accuracy prior to leaving the patient care area.       Baruch Bernardino Mungo, MD 08/16/22 [phone]

## 2022-08-26 NOTE — ED Provider Notes (Signed)
 [facility] EMERGENCY DEPARTMENT ENCOUNTER   CHIEF COMPLAINT   Chief Complaint  Patient presents with  . Shortness of Breath    HPI   [name] is a 72 y.o. male with past medical history significant for anemia, anxiety, Crohn's disease, who presents to the [facility] Emergency Department with 2 to 3-day history of shortness of breath with chest tightness that is improved with exertion and also with right calf pains without swelling or warmth.  Patient denies history of previous blood clot but was seen in urgent care clinic and sent to ER for further evaluation.  Is without fever, cough, nausea, vomiting or other symptoms or concerns but does report a history of pelvic floor pains that are chronic, worsening over the last 2-3 days as well but has not seen his primary care or physical therapy in regards to this in many years.  States he is only managing his pain at home with Tylenol  currently, unable to take NSAIDs due to Crohn's history.  PCP - Kathrin Felix, MD   REVIEW OF SYSTEMS   A complaint focused review of systems was discussed with the patient. Pertinent positive and/or negative findings as noted in HPI. All other systems negative except as marked.   PAST MEDICAL HISTORY   Past Medical History:  Diagnosis Date  . Abnormal ECG August   Tachycardia  . Anal fissure    Corrected by surgery  . Anemia 12 2023   Taking iron  . Anxiety   . Asthma Inactive  . Crohn disease (CMS-HCC)   . Depression   . Difficult intravenous access May 23   Currently have a central line 2 ports  . Diverticulitis of colon 2005   Resected colon  . Pacemaker   . Postoperative wound infection August   Absess on stomach wall    SURGICAL HISTORY   Past Surgical History:  Procedure Laterality Date  . COLON SURGERY  2005   Colon rescection due to diverticulitis ilestomy and open wound  . fisher takedown  04/17/2022  . HERNIA REPAIR     2014  . illeum  11/12/2021   ileum  resction and mesh removal  . incarerated bowel     2013  . INCISIONAL HERNIA REPAIR     with mesh- 2006  . LAPAROSCOPIC COLON RESECTION     2005  . SMALL INTESTINE SURGERY  2013 2023 april august   Ileium resection and mesh removal April fistula takedown August  . VASECTOMY  1998    CURRENT MEDICATIONS   No current facility-administered medications for this encounter.  Current Outpatient Medications:  .   multivitamin, ferrous fumarate/minerals/FA (HEMOCYTE-PLUS) 106 mg iron- 1 mg cap, Take 1 capsule (1 each total) by mouth daily., Disp: 90 capsule, Rfl: 1 .  acetaminophen  (TYLENOL ) 325 MG tablet, Take 2 tablets (650 mg total) by mouth nightly., Disp: , Rfl:  .  busPIRone  (BUSPAR ) 5 MG tablet, Take 1 tablet (5 mg total) by mouth Three (3) times a day., Disp: , Rfl:  .  ciprofloxacin  HCl (CIPRO ) 500 MG tablet, Take 1 tablet (500 mg total) by mouth every twelve (12) hours., Disp: , Rfl:  .  diphenoxylate -atropine  (LOMOTIL ) 2.5-0.025 mg per tablet, Take 1 tablet by mouth Three (3) times a day. Before meals, Disp: , Rfl:  .  fexofenadine (ALLEGRA) 180 MG tablet, Take 1 tablet (180 mg total) by mouth daily., Disp: , Rfl:  .  finasteride  (PROSCAR ) 5 mg tablet, Take 1 tablet (5  mg total) by mouth daily., Disp: , Rfl:  .  hyoscyamine  (LEVSIN /SL) 0.125 mg SL tablet, Place 1 tablet (0.125 mg total) under the tongue every four (4) hours as needed for cramping. (Patient taking differently: Place 1 tablet (0.125 mg total) under the tongue nightly.), Disp: 100 tablet, Rfl: 1 .  iron,carbonyl-vitamin C (VITRON-C) 65 mg iron- 125 mg TbEC, Take 1 tablet by mouth nightly., Disp: , Rfl:  .  loperamide  (IMODIUM  A-D) 2 mg tablet, Take 1 tablet (2 mg total) by mouth Three (3) times a day with a meal. Before meals, Disp: , Rfl:  .  melatonin 3 mg Tab, Take 1 tablet (3 mg total) by mouth nightly., Disp: , Rfl:  .  ondansetron  (ZOFRAN ) 4 MG tablet, Take 1 tablet (4 mg total) by mouth every six (6) hours., Disp:  100 tablet, Rfl: 1 .  oxybutynin  (DITROPAN ) 5 MG tablet, Take 1 tablet (5 mg total) by mouth Three (3) times a day., Disp: , Rfl:  .  oxyCODONE -acetaminophen  (PERCOCET) 5-325 mg per tablet, Take 1 tablet by mouth every four (4) hours as needed for pain for up to 5 days., Disp: 10 tablet, Rfl: 0 .  pantoprazole  (PROTONIX ) 40 MG tablet, Take 1 tablet (40 mg total) by mouth two (2) times a day., Disp: 90 tablet, Rfl: 3 .  simethicone  (MYLICON) 80 MG chewable tablet, Chew 1 tablet (80 mg total) every six (6) hours as needed for flatulence., Disp: , Rfl:  .  sterile water  SolP 484.7 mL with amino acid (CLINISOL  SF) 15% 15 % SolP 45.9 g, dextrose  70 % SolP 162.001 g, sodium acetate  2 mEq/mL Soln 21.6 mEq, sodium chloride  4 mEq/mL SolP 21.6 mEq, potassium phosphate  3 mmol/mL Soln 16.2 mmol, potassium chloride  2 mEq/mL Soln 19.44 mEq, magnesium  sulfate 4 mEq/mL (50 %) Soln 8.6478 mEq, calcium  gluconate 100 mg/mL (10%) Soln 5.0778 mEq, multivitamin, adult injection 3,300 unit- 150 mcg/10 mL Soln 10 mL, trace elements (TRALEMENT) Zn-Cu-Mn-Se 3 mg-0.3 mg-55 mcg-60 mcg/mL Soln 1 mL, chromium chloride 4 mcg/mL Soln 10 mcg, Infuse into a venous catheter. Taper up for   Hours. Taper down for   Hours., Disp: , Rfl:  .  tamsulosin  (FLOMAX ) 0.4 mg capsule, Take 1 capsule (0.4 mg total) by mouth daily., Disp: , Rfl:  .  traZODone  (DESYREL ) 100 MG tablet, Take 1 tablet (100 mg total) by mouth nightly. (Patient taking differently: Take 0.5 tablets (50 mg total) by mouth nightly.), Disp: 90 tablet, Rfl: 3  ALLERGIES   Allergies  Allergen Reactions  . Mercaptopurine  Analogues (Thiopurines)   . Shellfish Containing Products Anaphylaxis  . Wound Dressings Rash  . Humira  [Adalimumab ]     FAMILY HISTORY   Family History  Problem Relation Age of Onset  . Lung cancer Mother   . Alcohol abuse Mother   . Cancer Mother        Lung  . Leukemia Father   . Cancer Father        Leukemia    SOCIAL HISTORY   Social History    Tobacco Use  . Smoking status: Never  . Smokeless tobacco: Never  Vaping Use  . Vaping Use: Never used  Substance Use Topics  . Alcohol use: Yes    Alcohol/week: 1.0 standard drink of alcohol    Types: 1 Cans of beer per week    Comment: 1 drink oer year  . Drug use: Never    PHYSICAL EXAM   VITAL SIGNS:  Vitals:   08/26/22  1130 08/26/22 1544  BP: 118/57 114/68  Pulse: 75 68  Resp: 15 18  Temp: 36.6 C (97.8 F)   TempSrc: Oral   SpO2: 100% 100%  Weight: 75.3 kg (166 lb)   Height: 177.8 cm (5' 10)     Body mass index is 23.82 kg/m.   Physical Exam Vitals and nursing note reviewed.  Constitutional:      General: He is not in acute distress.    Appearance: Normal appearance. He is not toxic-appearing.  HENT:     Head: Normocephalic and atraumatic.     Nose: Nose normal.  Eyes:     Conjunctiva/sclera: Conjunctivae normal.  Cardiovascular:     Rate and Rhythm: Normal rate and regular rhythm.     Heart sounds: Normal heart sounds.     Comments: DP pulse 2+ bilaterally Pulmonary:     Effort: Pulmonary effort is normal.     Breath sounds: Normal breath sounds.  Abdominal:     General: Abdomen is flat.     Palpations: Abdomen is soft.     Tenderness: There is no abdominal tenderness.     Comments: Ostomy bag noted  Musculoskeletal:        General: No tenderness.     Right lower leg: No edema.     Left lower leg: No edema.     Comments: Right lower leg diffusely nontender without erythema, warmth or swelling when compared to left lower leg  Skin:    General: Skin is warm and dry.  Neurological:     General: No focal deficit present.     Mental Status: He is alert.  Psychiatric:        Mood and Affect: Mood normal.        Behavior: Behavior normal.     ED Course Patient was placed in a bed, and seen by me as promptly as was possible. It is my usual and customary practice to review and/or amended the recorded PMH, PSH, Meds, allergies and family history  for this patient. Limitations to history are: None and therefore a third party was not necessary.  Patient's old records were reviewed - Most recent visit to [facility] on 08/15/2022 for epigastric pain, negative CT chest abdomen pelvis.  Patient's vital signs reviewed - BP 114/68   Pulse 68   Temp 36.6 C (97.8 F) (Oral)   Resp 18   Ht 177.8 cm (5' 10)   Wt 75.3 kg (166 lb)   SpO2 100%   BMI 23.82 kg/m    11:40 AM Patient initially assessed 72 y.o. male with chief complaint shortness of breath. Stable vitals without fever or tachycardia, O2 100% on room air. Benign exam. Will evaluate for DVT, PE and rule out ACS, arrhythmia, chemistry changes, pleural changes, anemia.  Presentation not consistent with COVID, flu, dissection, acute abdomen. Patient received Percocet shortly after initial evaluation. ED Course as of 08/26/22 1551  Sat Aug 26, 2022  1222 WBC: 4.6  1222 HGB(!): 11.1  1232 ECG 12 Lead EKG (as interpreted by me):  Rate - 68 PR - 168 QRS - 110 QTc - 455 Axis - Left axis deviation Rhythm - normal sinus rhythm No ST/T wave abnormalities noted No evidence of acute myocardial infarction No significant changes from prior study of 08/15/22  1304 Magnesium : 2.0  1304 hsTroponin I: <3  1400 PVL Venous Duplex Lower Extremity Right There was no evidence of acute deep vein thrombosis on this right lower extremity venous duplex exam.  1411 XR Chest 1 view - Right central venous catheter with tip in the high right atrium. - No acute airspace disease.  1527 CT Chest W Contrast 1. Negative for acute pulmonary embolism. 2. No CT evidence for acute cardiopulmonary disease. 3. Stable scarring within the lateral basal right lower lobe.  1548 Patient updated on results and is comfortable discharging home.  Questionable anxiety causing ongoing chest symptoms and encouraged him to follow-up with primary care team and neurologist in regards to ongoing chronic pelvic floor pains.   Questionable strain of right calf but with reassuring evaluation negative for DVT and without trauma.  Will prescribe patient a small amount of Percocet to manage his pain at home until you follow-up with his primary care physician, return precautions provided, patient stable for discharge.   At the time of disposition, I reviewed with the patient the results from ED diagnostic testing, and I counseled the patient about the need for follow-up with the appropriate outpatient physician.  Pt was made aware that ED evaluation is not a substitute for ongoing outpatient care. Patient counseled that if there is any change in condition or worsening of symptoms that patient is to return to the ED; return precautions and anticipatory guidance given. The patient expressed understanding of these instructions. Patient in stable condition at time of disposition.  During the active portion of the patient's stay, I ordered: Orders Placed This Encounter  Procedures  . CT Chest W Contrast  . XR Chest 1 view  . CBC w/ Differential  . Comprehensive Metabolic Panel  . Magnesium   . hsTroponin I (single, no delta)  . ECG 12 Lead  . Insert peripheral IV    MEDICATIONS ADMINISTERED  Medications  oxyCODONE -acetaminophen  (PERCOCET) 5-325 mg tablet 1 tablet (1 tablet Oral Given 08/26/22 1208)  iohexol  (OMNIPAQUE ) 350 mg iodine/mL solution 50 mL (50 mL Intravenous Given 08/26/22 1514)     FINAL IMPRESSION   1. SOB (shortness of breath)   2. Chest pain, unspecified type   3. Anxiety   4. Right calf pain   5. 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  FOLLOW UP  Kathrin Felix, MD [address] [place] [address] [phone]  Call in 1 day    **Sections of this note were created using Dragon voice recognition software.  Some transcription errors may have been missed during a final  proofread.Gurjot, [place], [place] 08/26/22 1551

## 2022-09-11 NOTE — Discharge Summary (Signed)
 ------------------------------------------------------------------------------- Attestation signed by Justin Blossom, MD at 09/11/22 [phone]   Surgeon attestation: I saw and examined the patient.  I reviewed the findings with our advanced practice provider.  I agree with the assessment and plan as documented in APP's note.   -------------------------------------------------------------------------------   Discharge Summary  Admit date: 08/30/2022  Discharge date and time: 09/11/22  Discharge to:  Home  Discharge Service: General Surgery  Discharge Attending Physician: Callahan Glynda, MD  Discharge  Diagnoses: Crohn's; h/o ileostomy (resolved)  Secondary Diagnosis: Active Problems:   * No active hospital problems. * Resolved Problems:   * No resolved hospital problems. *   OR Procedures:   ILEOSTOMY REVERSAL Date 08/30/2022 -------------------   Ancillary Procedures: CT a/p, AXR  Discharge Day Services: none  Subjective  No acute events overnight. Pain Controlled. No fever or chills.  Objective  Patient Vitals for the past 8 hrs:  BP Pulse SpO2  09/11/22 0531 114/55 60 98 %   No intake/output data recorded.  Abdomen: soft, non-tender, non-distended, no organomegaly, no hernia or masses palpated. Incision: clean, no surrounding cellulitis or swelling, min ss drainage RRR, NAD  Hospital Course:  The patient was admitted to the floor after a DLI reversal  with Dr. Derinda on 08/30/22 for Ostomy pouching difficulty, hx Crohn's disease, s/p left colectomy, ileocolonic resection with DLI with removal of mesh, and enterocutaneous fistula takedown.   Postop pain was controlled with oral pain meds. Diet was gradually advanced which was tolerated well. Pt was ambulating well, bowel function returned gradually s/p postop ileus, and they were able to void.   Pt was therefore discharged to home in stable condition on 09/11/22.  I personally spent 60 minutes in discharge  planning services including the coordination of care and patient education.    Follow up plan(s): - on 09/21/22 or 09/14/22 with Dr. Derinda, Colorectal Surgery  Pathology from surgery: Diagnosis  Small intestine (ileostomy trim): -Benign skin small intestinal tissue consistent with ostomy site.   ---  Active problems present on admission: -Ostomy pouching difficulty: surg management as above.  -Hx  Crohn's disease, s/p left colectomy, ileocolonic resection with DLI, with removal of mesh, and enterocutaneous fistula takedown.  -GERD: home PPI  -Anemia of chronic dz: preop Hgb 11.1, will monitor postop and transfuse if <7 (<8 if cardiac hx) or symptoms -Asthma  -Anxiety: cont home buspar  -Depression  -Difficult intravenous access  -Pacemaker  -BPH: cont home Proscar , Flomax  -Insomnia: cont home Trazodone , melatonin   Condition at Discharge: Improved Discharge Medications:    Medication List    START taking these medications   . apixaban 2.5 mg Tab; Commonly known as: ELIQUIS; Take 1 tablet (2.5 mg  total) by mouth two (2) times a day. After 30 days, please stop; this is  for extended DVT prophylaxis only. . docusate sodium  100 MG capsule; Commonly known as: COLACE; Take 1  capsule (100 mg total) by mouth two (2) times a day. . hydrocortisone  2.5 % ointment; Apply 1 application to external  hemorrhoid topically two (2) times a day for 10 days. . * ondansetron  4 MG disintegrating tablet; Commonly known as: ZOFRAN -ODT;  Dissolve 1 tablet (4 mg total) by mouth every six (6) hours as needed for  nausea. . * oxyCODONE  10 mg immediate release tablet; Commonly known as:  ROXICODONE ; Take ? to 1 tablet (5-10 mg total) by mouth every six (6)  hours as needed for pain. * This list has 2 medication(s) that are the same as  other medications  prescribed for you. Read the directions carefully, and ask your doctor or  other care provider to review them with you.   CONTINUE taking these  medications   . acetaminophen  325 MG tablet; Commonly known as: TYLENOL  . busPIRone  5 MG tablet; Commonly known as: BUSPAR  . dicyclomine 20 mg tablet; Commonly known as: BENTYL . ferrous sulfate  325 (65 FE) MG EC tablet . fexofenadine 180 MG tablet; Commonly known as: ALLEGRA . finasteride  5 mg tablet; Commonly known as: PROSCAR  . FLOMAX  0.4 mg capsule; Generic drug: tamsulosin  . hyoscyamine  0.125 mg SL tablet; Commonly known as: LEVSIN /SL; Place 1  tablet (0.125 mg total) under the tongue every four (4) hours as needed  for cramping. . melatonin 3 mg Tab . multivitamin per tablet; Commonly known as: TAB-A-VITE/THERAGRAN . ondansetron  4 MG tablet; Commonly known as: ZOFRAN ; Take 1 tablet (4 mg  total) by mouth every six (6) hours. . oxybutynin  5 MG tablet; Commonly known as: DITROPAN  . pantoprazole  40 MG tablet; Commonly known as: Protonix ; Take 1 tablet  (40 mg total) by mouth two (2) times a day. . simethicone  80 MG chewable tablet; Commonly known as: MYLICON . sterile water  SolP 484.7 mL with amino acid (CLINISOL  SF) 15% 15 % SolP  45.9 g, dextrose  70 % SolP 162.001 g, sodium acetate  2 mEq/mL Soln 21.6  mEq, sodium chloride  4 mEq/mL SolP 21.6 mEq, potassium phosphate  3 mmol/mL  Soln 16.2 mmol, potassium chloride  2 mEq/mL Soln 19.44 mEq, magnesium   sulfate 4 mEq/mL (50 %) Soln 8.6478 mEq, calcium  gluconate 100 mg/mL (10%)  Soln 5.0778 mEq, multivitamin, adult injection 3,300 unit- 150 mcg/10 mL  Soln 10 mL, trace elements (TRALEMENT) Zn-Cu-Mn-Se 3 mg-0.3 mg-55 mcg-60  mcg/mL Soln 1 mL, chromium chloride 4 mcg/mL Soln 10 mcg . traZODone  100 MG tablet; Commonly known as: DESYREL ; Take 1 tablet (100  mg total) by mouth nightly.   STOP taking these medications   . diphenoxylate -atropine  2.5-0.025 mg per tablet; Commonly known as:  LOMOTIL  . loperamide  2 mg tablet; Commonly known as: IMODIUM  A-D . oxyCODONE -acetaminophen  5-325 mg per tablet; Commonly known as: PERCOCET   ASK your  doctor about these medications   . * ondansetron  4 MG disintegrating tablet; Commonly known as: ZOFRAN -ODT;  Dissolve 1 tablet (4 mg total) in mouth every six (6) hours as needed for  nausea.; Ask about: Should I take this medication? . * oxyCODONE  20 mg immediate release tablet; Commonly known as:  ROXICODONE ; Take ? to 1 tablet (10-20 mg total) by mouth every six (6)  hours as needed for pain.; Ask about: Should I take this medication? . prochlorperazine  5 MG tablet; Commonly known as: COMPAZINE ; Take 1  tablet (5 mg total) by mouth every six (6) hours as needed for nausea for  up to 7 days.; Ask about: Should I take this medication? * This list has 2 medication(s) that are the same as other medications  prescribed for you. Read the directions carefully, and ask your doctor or  other care provider to review them with you.    Pending Test Results: none  Discharge Instructions: Activity:  Activity Instructions     Activity as tolerated     Lifting restrictions (specify)     Weight restriction of 10 lbs.   OK to shower (no bath)        Diet: Diet Instructions     Discharge diet (specify)     Discharge Nutrition Therapy: Regular      Other Instructions:  Other Instructions     Call MD for:  difficulty breathing, headache or visual disturbances     Call MD for:  hives     Call MD for:  persistent dizziness or light-headedness     Call MD for:  persistent nausea or vomiting     Call MD for:  redness, tenderness, or signs of infection (pain, swelling, redness, odor or green/yellow discharge around incision site)     Call MD for:  severe uncontrolled pain     Call MD for: Temperature > 38.5 Celsius ( > 101.3 Fahrenheit)     Discharge instructions     [facility] Division of Colorectal Surgery Discharge Instructions:  ACTIVITY Ambulate several times a day to promote good circulation. Light treadmill, walking, or biking is fine. Do not lift heavy weights or  perform rigorous cardiovascular activity until postop appointment. Do not drive a car or operate hazardous machinery while taking narcotic pain medication. You may resume driving when you 1) are not taking narcotics, and 2) feel safe having to apply brakes suddenly if you were driving. For most patients, this is 1-2 weeks after surgery.  Advance activity as tolerated, but avoid strenuous exercise and straining.  You may climb stairs. Do not lift >10 lbs for 6 weeks after surgery.  It will be at least 6 weeks before your energy levels return to what they were before surgery.  DIET Drink plenty of liquids (pain medications such as Ultram/tramadol, Norco/hydrocodone , or Percocet/oxycodone  may cause constipation), 6-8 glasses of water  daily. You may eat a regular diet, although we advise going slow with smaller more frequent meals.  Take Miralax  (a liquid) or Colace (a capsule), one-two doses per day, to keep your stools soft while taking pain medications ONLY if you need to.  If your stools are loose, do not take it. Call office for prolonged nausea or vomiting.   Keep your urine a light yellow color; this is a good indicator of hydration. If it is straw or amber colored, call our office as you could be getting dehydrated. Do not drink alcohol while taking narcotic pain medication.  DRESSING AND WOUND CARE - OSTOMY TAKEDOWN SITE Just shower over the wound daily for irrigation, allow to dry, cover with gauze and tape. Do this daily until a scab forms.  Do not take tub baths until your follow up appointment.  A small amount of bleeding or bruising is not unusual.  Call office for excessive bleeding, drainage, warmth, or swelling from the incision sites.  BOWEL MOVEMENTS Your bowels may move fast, loose, and urgently for the first week or so after surgery. This is ok. It is usually from a combination of the new colon connection, your rectum responding to not having been used in a while, and the stool  softeners used to counteract the pain medicine side effect of constipation. Over the next few weeks, bms should become more formed & gradually decrease in frequency. When you go home, you may start taking a fiber supplement; we recommend Citrucel (less gas-producing) or Benefiber daily. At 1 week, you can increase to 1 tbsp nightly. If you have continuous watery diarrhea multiple times daily, please call our office.  If you have blood in your stools, please call our office.   DVT PROPHYLAXIS  Given your surgery and associated diagnosis that may increase your risk of blood clots, you will be sent home with 30 days of prophylactic dose blood thinner to help prevent blood clots, either Eliquis (  a pill) or Lovenox  (an injection). Take as prescribed. This is not full anticoagulation, so it is ok to take NSAIDs with it. It is equivalent to the heparin  or lovenox  injections you received in the Hospital. After 30 days, you can stop the medication.   PAIN Acetaminophen /Tylenol : take 650mg  every 8 hours as needed for pain or fever. DO NOT take this if you were prescribed a narcotic pain medication that contains acetaminophen /Tylenol  (such as Norco or Percocet). Do not take this if you have liver disease.  Ibuprofen/Advil: take 600mg  every 8 hours as needed for pain or fever. Do not take this if you are on any blood thinning medications (such as Plavix, Eliquis, etc), if you have kidney disease, or if you have a history of (or current) gastric ulcer. Take with food. Do not take this for longer than 5 days total (if taking around the clock).  It is advised to alternate the tylenol  and ibuprofen. Walk frequently. You may use heat or ice to your abdomen &/or perineal area as needed. Take narcotic pain medications &/or muscle relaxers as prescribed only if needed.  Narcotic/Pain medication Policy: After your upcoming surgery, you may receive a narcotic pain medication.  This poses a risk of addiction and dependency  along with fatal outcomes if not used as prescribed. It is important to understand that these drugs can be very helpful, but have the potential for misuse and are therefore closely controlled by the local, state, and federal governments. Our policy is in place to help use these medications appropriately and keep you safe. You can only receive a certain amount of narcotics at the time of discharge. They are for acute, postoperative pain only.   The [place]  STOP Act (effective Jan 2018) limits the amount of narcotics that can be prescribed postoperatively, as does [facility].  That being said, if you do need additional pain medications once home, you may call our office.   Refills, if warranted, are available during office hours, Monday through Friday from 8 AM to 4:30 PM. Please plan accordingly.  No refills of narcotic pain medications are available after hours or on the weekend from the provider on call.  Your refill may take up to forty-eight (48 hours) to fill. No refills for lost or stolen medications under any circumstance.  Our providers will not refill narcotic medications for any longer than 14 days total after surgery.  Our providers do not treat chronic pain. If you are followed by an outpatient Pain Clinic or if you receive narcotics regularly from another provider, please make sure they are aware you are having surgery.  As the patient, it is your responsibility to find out their policy for acute pain control in the postoperative setting and how that may affect your pain contract.   FOLLOW-UP CARE Call our office for excessive pain, fever >101, prolonged nausea, difficulty breathing, drainage, urinary issues, excessive diarrhea, or any other questions.  For questions/forms about FMLA or disability, contact [employer] @ ph [phone], fax [phone], or [email or web address] Call the nurse line for all medical questions during office hours [phone]). After  hours, call the physician on call for all urgent medical questions [phone]). Visit our website for other helpful post-operative information & resources: [email or web address]      Labs and Other Follow-ups after Discharge: Follow Up instructions and Outpatient Referrals    Call MD for:  difficulty breathing, headache or visual disturbances     Call MD for:  hives  Call MD for:  persistent dizziness or light-headedness     Call MD for:  persistent nausea or vomiting     Call MD for:  redness, tenderness, or signs of infection (pain, swelling,  redness, odor or green/yellow discharge around incision site)     Call MD for:  severe uncontrolled pain     Call MD for: Temperature > 38.5 Celsius ( > 101.3 Fahrenheit)     Discharge instructions       Future Appointments: Appointments which have been scheduled for you    Sep 21, 2022  1:30 PM (Arrive by 1:00 PM) POST-OP 10 with Norwood Fothergill, MD [place]  [facility]) [address] [place] [address] [phone]        --- Scribe's attestation: the attending physician obtained and performed the history, PE, & medical decision making elements that were entered into the chart by me, APP.

## 2022-09-19 ENCOUNTER — Ambulatory Visit (INDEPENDENT_AMBULATORY_CARE_PROVIDER_SITE_OTHER): Payer: Medicare Other

## 2022-09-19 DIAGNOSIS — I495 Sick sinus syndrome: Secondary | ICD-10-CM | POA: Diagnosis not present

## 2022-09-19 LAB — CUP PACEART REMOTE DEVICE CHECK
Battery Remaining Longevity: 113 mo
Battery Remaining Percentage: 86 %
Battery Voltage: 3.01 V
Brady Statistic AP VP Percent: 1 %
Brady Statistic AP VS Percent: 27 %
Brady Statistic AS VP Percent: 1.5 %
Brady Statistic AS VS Percent: 71 %
Brady Statistic RA Percent Paced: 27 %
Brady Statistic RV Percent Paced: 1.8 %
Date Time Interrogation Session: 20240305032441
Implantable Lead Connection Status: 753985
Implantable Lead Connection Status: 753985
Implantable Lead Implant Date: 20220518
Implantable Lead Implant Date: 20220518
Implantable Lead Location: 753859
Implantable Lead Location: 753860
Implantable Lead Model: 1944
Implantable Pulse Generator Implant Date: 20220518
Lead Channel Impedance Value: 490 Ohm
Lead Channel Impedance Value: 530 Ohm
Lead Channel Pacing Threshold Amplitude: 0.5 V
Lead Channel Pacing Threshold Amplitude: 0.5 V
Lead Channel Pacing Threshold Pulse Width: 0.5 ms
Lead Channel Pacing Threshold Pulse Width: 0.5 ms
Lead Channel Sensing Intrinsic Amplitude: 3.6 mV
Lead Channel Sensing Intrinsic Amplitude: 5 mV
Lead Channel Setting Pacing Amplitude: 0.75 V
Lead Channel Setting Pacing Amplitude: 1.5 V
Lead Channel Setting Pacing Pulse Width: 0.5 ms
Lead Channel Setting Sensing Sensitivity: 2 mV
Pulse Gen Model: 2272
Pulse Gen Serial Number: 3925308

## 2022-09-25 ENCOUNTER — Encounter: Payer: Self-pay | Admitting: Internal Medicine

## 2022-10-04 ENCOUNTER — Encounter: Payer: Self-pay | Admitting: Internal Medicine

## 2022-10-04 ENCOUNTER — Other Ambulatory Visit (INDEPENDENT_AMBULATORY_CARE_PROVIDER_SITE_OTHER): Payer: Medicare Other

## 2022-10-04 ENCOUNTER — Ambulatory Visit (INDEPENDENT_AMBULATORY_CARE_PROVIDER_SITE_OTHER): Payer: Medicare Other | Admitting: Internal Medicine

## 2022-10-04 VITALS — BP 100/60 | HR 65 | Ht 70.0 in | Wt 145.0 lb

## 2022-10-04 DIAGNOSIS — K90821 Short bowel syndrome with colon in continuity: Secondary | ICD-10-CM | POA: Diagnosis not present

## 2022-10-04 DIAGNOSIS — E43 Unspecified severe protein-calorie malnutrition: Secondary | ICD-10-CM | POA: Diagnosis not present

## 2022-10-04 DIAGNOSIS — Z9049 Acquired absence of other specified parts of digestive tract: Secondary | ICD-10-CM | POA: Diagnosis not present

## 2022-10-04 DIAGNOSIS — K50018 Crohn's disease of small intestine with other complication: Secondary | ICD-10-CM | POA: Diagnosis not present

## 2022-10-04 DIAGNOSIS — K638219 Small intestinal bacterial overgrowth, unspecified: Secondary | ICD-10-CM | POA: Diagnosis not present

## 2022-10-04 LAB — LIPID PANEL
Cholesterol: 97 mg/dL (ref 0–200)
HDL: 35.4 mg/dL — ABNORMAL LOW (ref >39.00)
LDL Cholesterol: 27 mg/dL (ref 0–99)
NonHDL: 61.9
Total CHOL/HDL Ratio: 3
Triglycerides: 175 mg/dL — ABNORMAL HIGH (ref 0.0–149.0)
VLDL: 35 mg/dL (ref 0.0–40.0)

## 2022-10-04 LAB — VITAMIN D 25 HYDROXY (VIT D DEFICIENCY, FRACTURES): VITD: 14.41 ng/mL — ABNORMAL LOW (ref 30.00–100.00)

## 2022-10-04 LAB — CBC
HCT: 44.5 % (ref 39.0–52.0)
Hemoglobin: 14.8 g/dL (ref 13.0–17.0)
MCHC: 33.3 g/dL (ref 30.0–36.0)
MCV: 85.7 fl (ref 78.0–100.0)
Platelets: 158 10*3/uL (ref 150.0–400.0)
RBC: 5.2 Mil/uL (ref 4.22–5.81)
RDW: 16.1 % — ABNORMAL HIGH (ref 11.5–15.5)
WBC: 7.1 10*3/uL (ref 4.0–10.5)

## 2022-10-04 LAB — FERRITIN: Ferritin: 22.8 ng/mL (ref 22.0–322.0)

## 2022-10-04 LAB — COMPREHENSIVE METABOLIC PANEL
ALT: 23 U/L (ref 0–53)
AST: 25 U/L (ref 0–37)
Albumin: 3.9 g/dL (ref 3.5–5.2)
Alkaline Phosphatase: 107 U/L (ref 39–117)
BUN: 7 mg/dL (ref 6–23)
CO2: 30 mEq/L (ref 19–32)
Calcium: 9.5 mg/dL (ref 8.4–10.5)
Chloride: 101 mEq/L (ref 96–112)
Creatinine, Ser: 1.15 mg/dL (ref 0.40–1.50)
GFR: 64.83 mL/min (ref >60.00)
Glucose, Bld: 70 mg/dL (ref 70–99)
Potassium: 3.3 mEq/L — ABNORMAL LOW (ref 3.5–5.1)
Sodium: 140 mEq/L (ref 135–145)
Total Bilirubin: 0.6 mg/dL (ref 0.2–1.2)
Total Protein: 7.2 g/dL (ref 6.0–8.3)

## 2022-10-04 LAB — MAGNESIUM: Magnesium: 1.8 mg/dL (ref 1.5–2.5)

## 2022-10-04 LAB — B12 AND FOLATE PANEL
Folate: >23.8 ng/mL (ref >5.9)
Vitamin B-12: 426 pg/mL (ref 211–911)

## 2022-10-04 LAB — PHOSPHORUS: Phosphorus: 3 mg/dL (ref 2.3–4.6)

## 2022-10-04 MED ORDER — AMOXICILLIN-POT CLAVULANATE 875-125 MG PO TABS: 1.0000 | ORAL_TABLET | Freq: Two times a day (BID) | ORAL | 0 refills | Status: DC

## 2022-10-04 NOTE — Patient Instructions (Addendum)
Your provider has requested that you go to the basement level for lab work before leaving today. Press "B" on the elevator. The lab is located at the first door on the left as you exit the elevator.  We have sent the following medications to your pharmacy for you to pick up at your convenience: Augmentin  Due to recent changes in healthcare laws, you may see the results of your imaging and laboratory studies on MyChart before your provider has had a chance to review them.  We understand that in some cases there may be results that are confusing or concerning to you. Not all laboratory results come back in the same time frame and the provider may be waiting for multiple results in order to interpret others.  Please give [place] 48 hours in order for your provider to thoroughly review all the results before contacting the office for clarification of your results.      ORAL REHYDRATION SOLUTION RECIPES   Sugar and salt water   ? 1 quart water ?  teaspoon salt ? 6 teaspoons sugar ? Optional: Crystal Light to taste (especially lemonade or orange-pineapple flavors)   Gatorade G2   ? 4 cups Gatorade G2 (or one, 32 ounce bottle) ? 1/2 teaspoon salt   Chicken Broth   ? 4 cups water ? 1 dry chicken broth cube ?  teaspoon salt ? 2 tablespoon sugar OR ? 2 cups liquid broth ? 2 cups water ? 2 tablespoon sugar  Tomato Juice   ? 2  cups tomato juice ? 1  cups water  Homemade Cereal Based  ?  cup dry, precooked baby rice cereal ? 2 cups water ?  teaspoon salt ? Combine ingredients and mix until well dissolved and smooth. Refrigerate. Solution should be thick, but pourable and drinkable.    I appreciate the opportunity to care for you. Silvano Rusk, MD, [facility]

## 2022-10-04 NOTE — Progress Notes (Unsigned)
[name] 72 y.o. Mar 04, 1951 [id number]  Assessment & Plan:   Encounter Diagnoses  Name Primary?   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 Yes   Protein-calorie malnutrition, severe    Crohn's disease of ileum with other complication ([place])    S/P small bowel resection    Small intestinal bacterial overgrowth (SIBO)    He is acting like he has short bowel though technically may not based upon < 100 cm small intestine  removal though could be true depending upon Crohn's activity  I have recommended he make and take oral rehyrdation solutions and have provided short bowel diet handout from [facility]  Orders Placed This Encounter  Procedures   CBC   Ferritin   B12 and Folate Panel   Comp Met (CMET)   Phosphorus   Magnesium   Prealbumin   Vitamin D (25 hydroxy)   Vitamin B1   Zinc   Lipid Profile    Sig abnormalities Vit D = 14.41 - start 50kU/week x 12  B1 = < 6 start thiamine 00 mg daily  Ferritin 22  -willneediron supp  K 3.3 - supplement 34mEq bid   Dietitian referral may be helpful also - will work on that - await f/u visit  Meds ordered this encounter  Medications   amoxicillin-clavulanate (AUGMENTIN) 875-125 MG tablet    Sig: Take 1 tablet by mouth 2 (two) times daily.    Dispense:  20 tablet    Refill:  0   F/U me 10/16/22  Will regroup re: resuming Crohn's tx- ? Retry Entyvio - main issue seems to have been a mesh problem (2023 surgery) though did have active enteritis in one specimen  Need to consider repeating a colonoscopy vs resuming Tx w/o    Subjective:  Gastroenterology problem summary  Crohn's disease of the small intestine-ileitis in a patient status post small bowel resection as below: June 2013 47 cm small bowel resection incarcerated ventral hernia Small intestine, resection - SMALL BOWEL WITH EXTENSIVE ACUTE SEROSITIS AND ASSOCIATED FAT NECROSIS AND ADHESIONS. - NO ATYPIA OR MALIGNANCY. - RESECTION MARGINS  VIABLE.  Original Crohn's diagnosis October 2018 colonoscopy, budesonide therapy then 6-MP plus Humira early 2019, developed pancreatitis from 6-MP.  Additional budesonide and then weekly Humira but antibodies very high 04/05/2018 and loss of efficacy. Prednisone used as budesonide was not helpful, Stelara tried early 2020 and then moved to [facility] fall 2020  2022 having problems with perineal and then abdominal pain and diarrhea progressed into 2023 and in March of that year CT showing enteritis/ileitis referred to Dr. Drue Callahan and had surgery    November 11, 2021 small bowel resection, two 9 cm long sections A. SMALL BOWEL ANASTOMOSIS, RESECTION:             - Anastomotic site, present.              - Small bowel mucosa with no significant abnormalities.              - Serosa with fibrous adhesions and histiocytic giant cell reaction to mesh material.    B. MESH, REMOVAL:             - Mesh with attached adipose and membranous tissue, gross examination only.    C. SMALL BOWEL ANASTOMOSIS #2, RESECTION:             - Chronic active ileitis with pseudopolyp formation and mucosa ulceration.  - Anastomotic site identified.  - See comment.  D. MESH #2 , REMOVAL:             - Mesh with attached adipose tissue, gross examination only.   Several admissions due to high output ileostomy and need for hydration and TPN rehab stays, also complicated by abdominal wall abscess requiring drainage.  Was on home TPN.  Dr. Drue Callahan did not want to takedown ileostomy. Was staying with his sister in [place] (she could help with TPN) and saw Dr. Lovett SoxJackson Callahan at [place]  08/29/2022 preoperative colonoscopy mild diversion colitis noted throughout the colon exam to long Hartmann stump and  08/30/2022 ileostomy reversal at [place] intestine (ileostomy trim): -Benign skin small intestinal tissue consistent with ostomy site.   This electronic signature is attestation that the pathologist personally  reviewed the submitted material(s) and the final diagnosis reflects that evaluation.  Electronically signed by Justin Bottom, MD on 08/31/2022 at 10:24 AM  Clinical History   Pre-op diagnosis: CROHN'S DISEASE  Gross Description   A.  Label: Small intestine (ileostomy trim)-permanent Stoma size: 2.2 x 2.1 cm Surrounding skin: 0.5 cm wide rim of lightly pigmented skin Attached bowel: 1.5 cm in length by 1.7 cm in diameter; unremarkable Other findings: An additional 4.5 x 2.2 x 1.5 cm mucosal fragment is present; unremarkable   Postop.  Outpatient, he struggled with hydration some Lomotil helping, some question of short-bowel syndrome consideration for TPN but not used     Chief Complaint: Diarrhea weight loss some abdominal pain  HPI 72 year old white man with Crohn's disease of the ileum status post multiple surgeries in the last year, most recently an ileostomy takedown, after he had resection of a Crohn's stricture in the setting of prior hernia repair with mesh and other small bowel resections.  See above for summary of all his GI problems and clinical course.  He has moved back to [place] after staying with his sister in [place] as she had coordinated TPN for him, his wife is here today.  He still has numerous watery stools each day though doing better on Lomotil to 4 times daily.  Trying to eat as best he can struggling to maintain/gain weight.  Concerned about malnutrition.  Has some pain at the incision site though that is getting better and his ostomy wound has closed down though there is a scab there.  He was placed on metronidazole for suspected SIBO but was having nausea and vomiting with that he cannot tolerate it well.  Ondansetron did not provide adequate relief.  He is drinking Gatorade and lots of fluids he is not taking any nutritional supplements.  He is very hungry and trying to eat everything he can.  He does have to get up about once a night in the early a.m. with  liquid stools.  He has not had fevers or bleeding.  Asking about when he would go back on Entyvio.  Wt Readings from Last 3 Encounters:  10/04/22 145 lb (65.8 kg)  06/18/22 156 lb 3.2 oz (70.9 kg)  05/02/22 158 lb 11.7 oz (72 kg)           Allergies  Allergen Reactions   Purinethol [Mercaptopurine] Other (See Comments)    Pancreatitis    Shellfish Allergy Anaphylaxis and Swelling    Can use standard SMOF lipid formulation for TPN without any issue.    Humira [Adalimumab] Other (See Comments)    Developed antibodies   Tape Rash   Wound Dressing Adhesive Rash   Current  Meds  Medication Sig   acetaminophen (TYLENOL) 325 MG tablet Take 650 mg by mouth every 6 (six) hours as needed for moderate pain.   albuterol (VENTOLIN HFA) 108 (90 Base) MCG/ACT inhaler Inhale 2 puffs into the lungs every 6 (six) hours as needed for wheezing or shortness of breath.   Amino Ac Elect-Calc in D15W (CLINIMIX E/DEXTROSE, 5/15, IV) Inject 65 mL/hr into the vein continuous. Via powerline TPN IV (104gm aa/183 GM DEC/50GM)   amoxicillin-clavulanate (AUGMENTIN) 875-125 MG tablet Take 1 tablet by mouth 2 (two) times daily.   calcium carbonate (TUMS - DOSED IN MG ELEMENTAL CALCIUM) 500 MG chewable tablet Chew 500 mg by mouth 3 (three) times daily as needed for indigestion or heartburn.   melatonin 3 MG TABS tablet Take 3 mg by mouth at bedtime.   METAMUCIL FIBER PO Take 1 packet by mouth in the morning.   Multiple Vitamin (MULTIVITAMIN WITH MINERALS) TABS tablet Take 1 tablet by mouth daily.   ondansetron (ZOFRAN-ODT) 4 MG disintegrating tablet Dissolve 1 tablet (4 mg total) by mouth every 6 (six) hours as needed for nausea or vomiting.   oxybutynin (DITROPAN) 5 MG tablet Take 1 tablet (5 mg total) by mouth 3 (three) times daily.   simethicone (MYLICON) 80 MG chewable tablet Chew 80 mg by mouth 4 (four) times daily -  with meals and at bedtime.   tamsulosin (FLOMAX) 0.4 MG CAPS capsule Take 1 capsule (0.4  mg total) by mouth daily after supper.   Past Medical History:  Diagnosis Date   Acute prostatitis 07/24/2007   Qualifier: Diagnosis of  By: [name] MD, [name]    Allergy    mild   Arthritis    osteoarthritis   Asthma    Blood transfusion without reported diagnosis    BPH (benign prostatic hypertrophy) with urinary obstruction    Crohn's ileitis ([place]) suspected 05/03/2017   Dilated aortic root ([place])    noted on echo 08/2012   Diverticulitis of colon    EPIDIDYMITIS 02/15/2010   Qualifier: Diagnosis of  By: [name] MD, [name]    GERD (gastroesophageal reflux disease)    H/O: GI bleed    Hemorrhoids    Hepatitis 1975   unknown type    HERPES SIMPLEX INFECTION 10/14/2007   Qualifier: Diagnosis of  By: [name] MD, [name]    Hiatal hernia    Ileus following gastrointestinal surgery ([place]) 12/26/2011   Long term (current) use of systemic steroids 06/18/2018   Psoriasis    sees [name] at [facility].   Recurrent ventral incisional hernia 05/10/2012   Short bowel syndrome    SVT (supraventricular tachycardia)    Ulcer 08/21/2013   ileal   Past Surgical History:  Procedure Laterality Date   BOWEL RESECTION  12/19/2011   Procedure: SMALL BOWEL RESECTION;  Surgeon: [name], MD;  [facility]: [facility];  Service: General;  Laterality: N/A;  with anastamosis and insertion mesh   BRONCHOSCOPY     COLON SURGERY  01/2004   x 2   COLONOSCOPY W/ BIOPSIES  04/26/2017   per [name], no polyps, benign inflammation, repeat in 5 yrs    CYSTOSCOPY     ESOPHAGOGASTRODUODENOSCOPY     HEMICOLECTOMY     left side, at [facility], diverticulitis   [place]     [phone] incisional hernia   ILEOSTOMY     ILEOSTOMY CLOSURE     INSERTION OF MESH  07/31/2012   Procedure: INSERTION OF MESH;  Surgeon: [name], MD;  [facility];  Service: General;;   LAPAROTOMY  12/19/2011   Procedure: EXPLORATORY LAPAROTOMY;  Surgeon: [name],  MD;  [facility];  Service: General;  Laterality: N/A;   PACEMAKER IMPLANT N/A 12/01/2020   Procedure: PACEMAKER IMPLANT;  Surgeon: [name], MD;  Location: [place] [facility];  Service: Cardiovascular;  Laterality: N/A;   PACEMAKER INSERTION Left    TONSILLECTOMY     UPPER GASTROINTESTINAL ENDOSCOPY     VASECTOMY     VENTRAL HERNIA REPAIR  07/31/2012   Procedure: HERNIA REPAIR VENTRAL ADULT;  Surgeon: [name], MD;  [facility];  Service: General;  Laterality: N/A;   Social History   Social History Narrative   He is married with 2 sons 1 son is an Art gallery manager and 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   Review of Systems As perHPI  Objective:   Physical Exam BP 100/60   Pulse 65   Ht 5\' 10"  (1.778 m)   Wt 145 lb (65.8 kg)   SpO2 94%   BMI 20.81 kg/m  Thin, chronically ill wm NAD Eyes anicteric Lungs CTA HtS1S2 no rmg Abd thin, ileostomy scar/scab, NT no HSM/mass   Rectal  inspection - mild perianal erythema, no lesions Ext thin, mm wasting no edema Alert and oriented x 3 Flat affect

## 2022-10-05 LAB — PREALBUMIN: Prealbumin: 24 mg/dL (ref 21–43)

## 2022-10-08 LAB — VITAMIN B1: Vitamin B1 (Thiamine): <6 nmol/L — ABNORMAL LOW (ref 8–30)

## 2022-10-10 ENCOUNTER — Other Ambulatory Visit: Payer: Self-pay | Admitting: Internal Medicine

## 2022-10-10 ENCOUNTER — Other Ambulatory Visit (HOSPITAL_COMMUNITY): Payer: Self-pay

## 2022-10-10 MED ORDER — POTASSIUM CHLORIDE CRYS ER 20 MEQ PO TBCR
20.0000 meq | EXTENDED_RELEASE_TABLET | Freq: Two times a day (BID) | ORAL | 0 refills | Status: DC
  Filled 2022-10-10: qty 60, 30d supply, fill #0

## 2022-10-10 MED ORDER — POTASSIUM CHLORIDE CRYS ER 20 MEQ PO TBCR: 20.0000 meq | EXTENDED_RELEASE_TABLET | Freq: Two times a day (BID) | ORAL | 0 refills | Status: DC

## 2022-10-10 MED ORDER — FERROUS SULFATE 325 (65 FE) MG PO TABS: 325.0000 mg | ORAL_TABLET | Freq: Every day | ORAL | 3 refills | Status: DC

## 2022-10-10 MED ORDER — THIAMINE HCL 100 MG PO TABS: 100.0000 mg | ORAL_TABLET | Freq: Every day | ORAL | 0 refills | Status: DC

## 2022-10-10 MED ORDER — VITAMIN D (ERGOCALCIFEROL) 1.25 MG (50000 UNIT) PO CAPS: 50000.0000 [IU] | ORAL_CAPSULE | ORAL | 0 refills | Status: DC

## 2022-10-10 NOTE — Telephone Encounter (Signed)
I spoke with [facility] and no he is not already on K. He said he didn't request the 90 day rx it's just something his insurance automatic does.

## 2022-10-10 NOTE — Telephone Encounter (Signed)
Please advise Sir, thank you. 

## 2022-10-10 NOTE — Telephone Encounter (Signed)
OK - I declined the 90 day

## 2022-10-10 NOTE — Telephone Encounter (Signed)
Find out from [place] if he is already on potassium? (But not on our list)

## 2022-10-14 LAB — ZINC: Zinc: 100 ug/dL (ref 60–130)

## 2022-10-16 ENCOUNTER — Encounter: Payer: Self-pay | Admitting: Internal Medicine

## 2022-10-16 ENCOUNTER — Ambulatory Visit (INDEPENDENT_AMBULATORY_CARE_PROVIDER_SITE_OTHER): Payer: Medicare Other | Admitting: Internal Medicine

## 2022-10-16 ENCOUNTER — Other Ambulatory Visit (INDEPENDENT_AMBULATORY_CARE_PROVIDER_SITE_OTHER): Payer: Medicare Other

## 2022-10-16 VITALS — BP 116/62 | HR 74 | Ht 70.0 in | Wt 144.0 lb

## 2022-10-16 DIAGNOSIS — R202 Paresthesia of skin: Secondary | ICD-10-CM

## 2022-10-16 DIAGNOSIS — E43 Unspecified severe protein-calorie malnutrition: Secondary | ICD-10-CM

## 2022-10-16 DIAGNOSIS — E519 Thiamine deficiency, unspecified: Secondary | ICD-10-CM

## 2022-10-16 DIAGNOSIS — E876 Hypokalemia: Secondary | ICD-10-CM

## 2022-10-16 DIAGNOSIS — Z9049 Acquired absence of other specified parts of digestive tract: Secondary | ICD-10-CM | POA: Diagnosis not present

## 2022-10-16 DIAGNOSIS — K638219 Small intestinal bacterial overgrowth, unspecified: Secondary | ICD-10-CM

## 2022-10-16 DIAGNOSIS — K50018 Crohn's disease of small intestine with other complication: Secondary | ICD-10-CM

## 2022-10-16 DIAGNOSIS — E559 Vitamin D deficiency, unspecified: Secondary | ICD-10-CM

## 2022-10-16 DIAGNOSIS — K90821 Short bowel syndrome with colon in continuity: Secondary | ICD-10-CM

## 2022-10-16 LAB — BASIC METABOLIC PANEL
BUN: 7 mg/dL (ref 6–23)
CO2: 28 mEq/L (ref 19–32)
Calcium: 9.2 mg/dL (ref 8.4–10.5)
Chloride: 107 mEq/L (ref 96–112)
Creatinine, Ser: 1.12 mg/dL (ref 0.40–1.50)
GFR: 66.91 mL/min (ref >60.00)
Glucose, Bld: 95 mg/dL (ref 70–99)
Potassium: 3.8 mEq/L (ref 3.5–5.1)
Sodium: 138 mEq/L (ref 135–145)

## 2022-10-16 NOTE — Progress Notes (Unsigned)
[name] 72 y.o. August 09, 1950 [id number]  Assessment & Plan:   Encounter Diagnoses  Name Primary?   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    Small intestinal bacterial overgrowth (SIBO)    S/P small bowel resection    Thiamine deficiency    Vitamin D deficiency    Protein-calorie malnutrition, severe    Hypokalemia    Paresthesia     He seems stable.  Still fairly ill and malnourished appearing though his numbers were okay with respect to albumin and prealbumin he is still quite thin and has muscle wasting.  I am still getting a handle on this in the short-term having only seen him a couple of weeks ago.  I think you will need to go back on Crohn's therapy.  I do not know that he actually fa ed Entyvio though it is possible.  On the specimen from April there was some active Crohn's in one of the small bowel resections though much of his problems were probably related to prior mesh.  He acts like he has short-bowel syndrome at least in part.  Technically may not have had enough resected question how active Crohn's disease.  Orders Placed This Encounter  Procedures   Calprotectin, Fecal   CT ENTERO ABD/PELVIS W CONTAST   Basic Metabolic Panel (BMET)   Vitamin B1    Once I see these results we will plan the next steps.  He is going to need a QuantiFERON again I think.  Will determine Entyvio versus other therapy.  We talked about a colonoscopy but I do not think he is up-to-date at this point.  Regarding the paresthesias could be related to thiamine deficiency I am not sure what else to order at this point.  May need to see primary care versus neurology about this.  Continue other supplementations.  See me again May 7 sooner if needed.   Subjective:    Gastroenterology problem summary   Crohn's disease of the small intestine-ileitis in a patient status post small bowel resection as below: June 2013 47 cm  small bowel resection incarcerated ventral hernia Small intestine, resection - SMALL BOWEL WITH EXTENSIVE ACUTE SEROSITIS AND ASSOCIATED FAT NECROSIS AND ADHESIONS. - NO ATYPIA OR MALIGNANCY. - RESECTION MARGINS VIABLE.   Original Crohn's diagnosis October 2018 colonoscopy, budesonide therapy then 6-MP plus Humira early 2019, developed pancreatitis from 6-MP.  Additional budesonide and then weekly Humira but antibodies very high 04/05/2018 and loss of efficacy. Prednisone used as budesonide was not helpful, Stelara tried early 2020 and then moved to [facility] fall 2020   2022 having problems with perineal and then abdominal pain and diarrhea progressed into 2023 and in March of that year CT showing enteritis/ileitis referred to [name] and had surgery       November 11, 2021 small bowel resection, two 9 cm long sections A. SMALL BOWEL ANASTOMOSIS, RESECTION:             - Anastomotic site, present.              - Small bowel mucosa with no significant abnormalities.              - Serosa with fibrous adhesions and histiocytic giant cell reaction to mesh material.    B. MESH, REMOVAL:             - Mesh with attached adipose and membranous tissue, gross examination only.  C. SMALL BOWEL ANASTOMOSIS #2, RESECTION:             - Chronic active ileitis with pseudopolyp formation and mucosa ulceration.  - Anastomotic site identified.  - See comment.    D. MESH #2 , REMOVAL:             - Mesh with attached adipose tissue, gross examination only.    Several admissions due to high output ileostomy and need for hydration and TPN rehab stays, also complicated by abdominal wall abscess requiring drainage.  Was on home TPN.  [name] did not want to takedown ileostomy. Was staying with his sister in [place] (she could help with TPN) and saw Dr. Lovett SoxJackson Latino at [place]   08/29/2022 preoperative colonoscopy mild diversion colitis noted throughout the colon exam to long Hartmann stump  and   08/30/2022 ileostomy reversal at [place] intestine (ileostomy trim): -Benign skin small intestinal tissue consistent with ostomy site.   This electronic signature is attestation that the pathologist personally reviewed the submitted material(s) and the final diagnosis reflects that evaluation.  Electronically signed by [name], MD on 08/31/2022 at 10:24 AM  Clinical History    Pre-op diagnosis: CROHN'S DISEASE  Gross Description    A.  Label: Small intestine (ileostomy trim)-permanent Stoma size: 2.2 x 2.1 cm Surrounding skin: 0.5 cm wide rim of lightly pigmented skin Attached bowel: 1.5 cm in length by 1.7 cm in diameter; unremarkable Other findings: An additional 4.5 x 2.2 x 1.5 cm mucosal fragment is present; unremarkable    Postop.  Outpatient, he struggled with hydration some Lomotil helping, some question of short-bowel syndrome consideration for TPN but not used   Multiple deficiencies-thiamine, potassium iron, vitamin D detected 10/04/2022 follow-up  Chief Complaint: Follow-up of Crohn's disease and diarrhea  HPI  72 year old white man here with his wife, he has Crohn's disease and problems as outlined above and has had a rough time in the last year, presenting for follow-up after 10/04/2022 visit..  Labs summarized as below.  Thiamine level less than 6 thiamine supplementation started Zinc level normal CBC normal Ferritin 22 B12 46 folate greater than 23 Potassium 3.3 Albumin 3.9 prealbumin 24 Phosphorus and magnesium normal Vitamin D 14.4, supplementation started Triglycerides 175 cholesterol 97 LDL 27 Wt Readings from Last 3 Encounters:  10/16/22 144 lb (65.3 kg)  10/04/22 145 lb (65.8 kg)  06/18/22 156 lb 3.2 oz (70.9 kg)   He continues to have diarrhea that was getting somewhat better, he had been on Augmentin for suspected SIBO.  He finishes that today in the last couple of days has had increased watery diarrhea.  In general he  averages about 8 a day taking 2 Lomotil 3 times daily.  He is not yet back on Crohn's therapy.  He says ever since his surgeries last year he has had numbness and tingling that can be painful in his hands and feet that improves with motion  Allergies  Allergen Reactions   Purinethol [Mercaptopurine] Other (See Comments)    Pancreatitis    Shellfish Allergy Anaphylaxis and Swelling    Can use standard SMOF lipid formulation for TPN without any issue.    Humira [Adalimumab] Other (See Comments)    Developed antibodies   Tape Rash   Wound Dressing Adhesive Rash   Current Meds  Medication Sig   acetaminophen (TYLENOL) 325 MG tablet Take 650 mg by mouth every 6 (six) hours as needed  for moderate pain.   albuterol (VENTOLIN HFA) 108 (90 Base) MCG/ACT inhaler Inhale 2 puffs into the lungs every 6 (six) hours as needed for wheezing or shortness of breath.   Amino Ac Elect-Calc in D15W (CLINIMIX E/DEXTROSE, 5/15, IV) Inject 65 mL/hr into the vein continuous. Via powerline TPN IV (104gm aa/183 GM DEC/50GM)   amoxicillin-clavulanate (AUGMENTIN) 875-125 MG tablet Take 1 tablet by mouth 2 (two) times daily.   calcium carbonate (TUMS - DOSED IN MG ELEMENTAL CALCIUM) 500 MG chewable tablet Chew 500 mg by mouth 3 (three) times daily as needed for indigestion or heartburn.   ferrous sulfate 325 (65 FE) MG tablet Take 1 tablet (325 mg total) by mouth daily with breakfast.   melatonin 3 MG TABS tablet Take 3 mg by mouth at bedtime.   METAMUCIL FIBER PO Take 1 packet by mouth in the morning.   Multiple Vitamin (MULTIVITAMIN WITH MINERALS) TABS tablet Take 1 tablet by mouth daily.   ondansetron (ZOFRAN-ODT) 4 MG disintegrating tablet Dissolve 1 tablet (4 mg total) by mouth every 6 (six) hours as needed for nausea or vomiting.   oxybutynin (DITROPAN) 5 MG tablet Take 1 tablet (5 mg total) by mouth 3 (three) times daily.   potassium chloride SA (KLOR-CON M) 20 MEQ tablet Take 1 tablet (20 mEq total) by mouth 2  (two) times daily.   simethicone (MYLICON) 80 MG chewable tablet Chew 80 mg by mouth 4 (four) times daily -  with meals and at bedtime.   tamsulosin (FLOMAX) 0.4 MG CAPS capsule Take 1 capsule (0.4 mg total) by mouth daily after supper.   thiamine (VITAMIN B1) 100 MG tablet Take 1 tablet (100 mg total) by mouth daily.   Vitamin D, Ergocalciferol, (DRISDOL) 1.25 MG (50000 UNIT) CAPS capsule Take 1 capsule (50,000 Units total) by mouth every 7 (seven) days.   Past Medical History:  Diagnosis Date   Acute prostatitis 07/24/2007   Qualifier: Diagnosis of  By: [name] MD, [name]    Allergy    mild   Arthritis    osteoarthritis   Asthma    Blood transfusion without reported diagnosis    BPH (benign prostatic hypertrophy) with urinary obstruction    Crohn's ileitis ([place]) suspected 05/03/2017   Dilated aortic root    noted on echo 08/2012   Diverticulitis of colon    EPIDIDYMITIS 02/15/2010   Qualifier: Diagnosis of  By: [name] MD, [name]    GERD (gastroesophageal reflux disease)    H/O: GI bleed    Hemorrhoids    Hepatitis 1975   unknown type    HERPES SIMPLEX INFECTION 10/14/2007   Qualifier: Diagnosis of  By: [name] MD, [name]    Hiatal hernia    Ileus following gastrointestinal surgery 12/26/2011   Long term (current) use of systemic steroids 06/18/2018   Psoriasis    sees [name] at [facility].   Recurrent ventral incisional hernia 05/10/2012   Short bowel syndrome    SVT (supraventricular tachycardia)    Ulcer 08/21/2013   ileal   Past Surgical History:  Procedure Laterality Date   BOWEL RESECTION  12/19/2011   Procedure: SMALL BOWEL RESECTION;  Surgeon: [name], MD;  [facility];  Service: General;  Laterality: N/A;  with anastamosis and insertion mesh   BRONCHOSCOPY     COLON SURGERY  01/2004   x 2   COLONOSCOPY W/ BIOPSIES  04/26/2017   per [name], no polyps, benign inflammation, repeat in  5 yrs    CYSTOSCOPY      ESOPHAGOGASTRODUODENOSCOPY     HEMICOLECTOMY     left side, at [facility], diverticulitis   HEMORRHOID BANDING     HERNIA REPAIR     [phone] incisional hernia   ILEOSTOMY     ILEOSTOMY CLOSURE     INSERTION OF MESH  07/31/2012   Procedure: INSERTION OF MESH;  Surgeon: [name], MD;  [facility];  Service: General;;   LAPAROTOMY  12/19/2011   Procedure: EXPLORATORY LAPAROTOMY;  Surgeon: [name], MD;  [facility];  Service: General;  Laterality: N/A;   PACEMAKER IMPLANT N/A 12/01/2020   Procedure: PACEMAKER IMPLANT;  Surgeon: [name], MD;  Location: [place] [facility];  Service: Cardiovascular;  Laterality: N/A;   PACEMAKER INSERTION Left    TONSILLECTOMY     UPPER GASTROINTESTINAL ENDOSCOPY     VASECTOMY     VENTRAL HERNIA REPAIR  07/31/2012   Procedure: HERNIA REPAIR VENTRAL ADULT;  Surgeon: [name], MD;  [facility];  Service: General;  Laterality: N/A;   Social History   Social History Narrative   He is married with 2 sons 1 son is an Art gallery manager and 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   Review of Systems  See HPI  Objective:   Physical Exam BP 116/62   Pulse 74   Ht 5\' 10"  (1.778 m)   Wt 144 lb (65.3 kg)   BMI 20.66 kg/m  Thin chronically ill white man no acute distress Muscle wasting and malnourished in appearance Lungs are clear Normal heart sounds Abdomen is soft scaphoid with multiple surgical scars and small eschar over the site of prior ileostomy but is nontender He appears  alert and oriented x 3 Inspection of the hands shows good capillary refill they are warm and I will see any lesions

## 2022-10-16 NOTE — Patient Instructions (Addendum)
Your provider has requested that you go to the basement level for lab work before leaving today. Press "B" on the elevator. The lab is located at the first door on the left as you exit the elevator.   You have been scheduled for a CT scan of the abdomen and pelvis at [facility] , 1st floor Radiology. You are scheduled on 11/06/2022 at 3:00pm. You should arrive at 1:15pm for registration.  We are giving you 2 bottles of contrast today that you will need to drink before arriving for the exam. The solution may taste better if refrigerated so put them in the refrigerator when you get home, but do NOT add ice or any other liquid to this solution as that would dilute it. Shake well before drinking.   Please follow the written instructions below on the day of your exam:   1) Do not eat anything 4 hours prior to your test   You may take any medications as prescribed with a small amount of water, if necessary. If you take any of the following medications: METFORMIN, GLUCOPHAGE, [place], AVANDAMET, RIOMET, FORTAMET, [place] MET, JANUMET, GLUMETZA or METAGLIP, you MAY be asked to HOLD this medication 48 hours AFTER the exam.   If you have any questions regarding your exam or if you need to reschedule, you may call [facility] at [phone] between the hours of 8:00 am and 5:00 pm, Monday-Friday.     Please come back on Nov 21, 2022 on 1:30pm _______________________________________  If your blood pressure at your visit was 140/90 or greater, please contact your primary care physician to follow up on this.  _______________________________________________________  If you are age 9 or older, your body mass index should be between 23-30. Your Body mass index is 20.66 kg/m. If this is out of the aforementioned range listed, please consider follow up with your Primary Care Provider.  If you are age 83 or younger, your body mass index should be between 19-25. Your Body mass index is 20.66 kg/m.  If this is out of the aformentioned range listed, please consider follow up with your Primary Care Provider.   ________________________________________________________  The [facility] providers would like to encourage you to use [facility] to communicate with providers for non-urgent requests or questions.  Due to long hold times on the telephone, sending your provider a message by [facility] may be a faster and more efficient way to get a response.  Please allow 48 business hours for a response.  Please remember that this is for non-urgent requests.  _______________________________________________________ It was a pleasure to see you today!  Thank you for trusting me with your gastrointestinal care!

## 2022-10-17 ENCOUNTER — Other Ambulatory Visit: Payer: Self-pay | Admitting: Internal Medicine

## 2022-10-17 ENCOUNTER — Encounter: Payer: Self-pay | Admitting: Internal Medicine

## 2022-10-17 ENCOUNTER — Other Ambulatory Visit (INDEPENDENT_AMBULATORY_CARE_PROVIDER_SITE_OTHER): Payer: Medicare Other

## 2022-10-17 DIAGNOSIS — K50018 Crohn's disease of small intestine with other complication: Secondary | ICD-10-CM

## 2022-10-17 DIAGNOSIS — K638219 Small intestinal bacterial overgrowth, unspecified: Secondary | ICD-10-CM

## 2022-10-17 DIAGNOSIS — K90821 Short bowel syndrome with colon in continuity: Secondary | ICD-10-CM | POA: Diagnosis not present

## 2022-10-17 LAB — C-REACTIVE PROTEIN: CRP: <1 mg/dL (ref 0.5–20.0)

## 2022-10-17 MED ORDER — DIPHENOXYLATE-ATROPINE 2.5-0.025 MG PO TABS: 2.0000 | ORAL_TABLET | Freq: Four times a day (QID) | ORAL | 2 refills | Status: DC | PRN

## 2022-10-19 ENCOUNTER — Other Ambulatory Visit: Payer: Self-pay | Admitting: Internal Medicine

## 2022-10-19 MED ORDER — DIPHENOXYLATE-ATROPINE 2.5-0.025 MG PO TABS: 2.0000 | ORAL_TABLET | Freq: Four times a day (QID) | ORAL | 2 refills | Status: DC | PRN

## 2022-10-20 LAB — VITAMIN B1: Vitamin B1 (Thiamine): 8 nmol/L (ref 8–30)

## 2022-10-25 NOTE — Progress Notes (Signed)
Remote pacemaker transmission.   

## 2022-10-26 ENCOUNTER — Other Ambulatory Visit: Payer: Medicare Other

## 2022-10-26 DIAGNOSIS — E519 Thiamine deficiency, unspecified: Secondary | ICD-10-CM

## 2022-10-26 DIAGNOSIS — E876 Hypokalemia: Secondary | ICD-10-CM

## 2022-10-26 DIAGNOSIS — K638219 Small intestinal bacterial overgrowth, unspecified: Secondary | ICD-10-CM

## 2022-10-26 DIAGNOSIS — E559 Vitamin D deficiency, unspecified: Secondary | ICD-10-CM

## 2022-10-26 DIAGNOSIS — E43 Unspecified severe protein-calorie malnutrition: Secondary | ICD-10-CM

## 2022-10-26 DIAGNOSIS — K50018 Crohn's disease of small intestine with other complication: Secondary | ICD-10-CM

## 2022-10-26 DIAGNOSIS — Z9049 Acquired absence of other specified parts of digestive tract: Secondary | ICD-10-CM

## 2022-10-26 DIAGNOSIS — K90821 Short bowel syndrome with colon in continuity: Secondary | ICD-10-CM

## 2022-10-26 DIAGNOSIS — R202 Paresthesia of skin: Secondary | ICD-10-CM

## 2022-11-01 LAB — CALPROTECTIN, FECAL: Calprotectin, Fecal: 275 ug/g — ABNORMAL HIGH (ref 0–120)

## 2022-11-06 ENCOUNTER — Ambulatory Visit (HOSPITAL_COMMUNITY)
Admission: RE | Admit: 2022-11-06 | Discharge: 2022-11-06 | Disposition: A | Discharge status: Home / Self Care | Payer: Medicare Other | Source: Ambulatory Visit | Attending: Internal Medicine | Admitting: Internal Medicine

## 2022-11-06 DIAGNOSIS — K90821 Short bowel syndrome with colon in continuity: Secondary | ICD-10-CM | POA: Diagnosis present

## 2022-11-06 DIAGNOSIS — K50018 Crohn's disease of small intestine with other complication: Secondary | ICD-10-CM

## 2022-11-06 DIAGNOSIS — E43 Unspecified severe protein-calorie malnutrition: Secondary | ICD-10-CM

## 2022-11-06 DIAGNOSIS — E559 Vitamin D deficiency, unspecified: Secondary | ICD-10-CM

## 2022-11-06 DIAGNOSIS — K638219 Small intestinal bacterial overgrowth, unspecified: Secondary | ICD-10-CM

## 2022-11-06 DIAGNOSIS — Z9049 Acquired absence of other specified parts of digestive tract: Secondary | ICD-10-CM | POA: Diagnosis present

## 2022-11-06 DIAGNOSIS — E519 Thiamine deficiency, unspecified: Secondary | ICD-10-CM

## 2022-11-06 DIAGNOSIS — E876 Hypokalemia: Secondary | ICD-10-CM | POA: Diagnosis present

## 2022-11-06 DIAGNOSIS — R202 Paresthesia of skin: Secondary | ICD-10-CM | POA: Diagnosis present

## 2022-11-06 MED ORDER — IOHEXOL 350 MG/ML SOLN
100.0000 mL | Freq: Once | INTRAVENOUS | Status: AC | PRN
  Administered 2022-11-06: 100 mL via INTRAVENOUS

## 2022-11-07 ENCOUNTER — Other Ambulatory Visit: Payer: Self-pay | Admitting: Internal Medicine

## 2022-11-08 NOTE — Telephone Encounter (Signed)
Please advise Sir, thank you. 

## 2022-11-12 ENCOUNTER — Other Ambulatory Visit: Payer: Self-pay | Admitting: Internal Medicine

## 2022-11-12 DIAGNOSIS — Z111 Encounter for screening for respiratory tuberculosis: Secondary | ICD-10-CM

## 2022-11-12 DIAGNOSIS — K50018 Crohn's disease of small intestine with other complication: Secondary | ICD-10-CM

## 2022-11-12 DIAGNOSIS — Z7960 Long term (current) use of unspecified immunomodulators and immunosuppressants: Secondary | ICD-10-CM

## 2022-11-15 ENCOUNTER — Other Ambulatory Visit (INDEPENDENT_AMBULATORY_CARE_PROVIDER_SITE_OTHER): Payer: Medicare Other

## 2022-11-15 DIAGNOSIS — K50018 Crohn's disease of small intestine with other complication: Secondary | ICD-10-CM | POA: Diagnosis not present

## 2022-11-15 DIAGNOSIS — Z111 Encounter for screening for respiratory tuberculosis: Secondary | ICD-10-CM

## 2022-11-15 DIAGNOSIS — Z7960 Long term (current) use of unspecified immunomodulators and immunosuppressants: Secondary | ICD-10-CM

## 2022-11-15 LAB — COMPREHENSIVE METABOLIC PANEL
ALT: 13 U/L (ref 0–53)
AST: 22 U/L (ref 0–37)
Albumin: 3.8 g/dL (ref 3.5–5.2)
Alkaline Phosphatase: 70 U/L (ref 39–117)
BUN: 12 mg/dL (ref 6–23)
CO2: 28 mEq/L (ref 19–32)
Calcium: 9 mg/dL (ref 8.4–10.5)
Chloride: 103 mEq/L (ref 96–112)
Creatinine, Ser: 0.94 mg/dL (ref 0.40–1.50)
GFR: 82.52 mL/min (ref >60.00)
Glucose, Bld: 95 mg/dL (ref 70–99)
Potassium: 3.6 mEq/L (ref 3.5–5.1)
Sodium: 140 mEq/L (ref 135–145)
Total Bilirubin: 0.5 mg/dL (ref 0.2–1.2)
Total Protein: 6.4 g/dL (ref 6.0–8.3)

## 2022-11-17 LAB — QUANTIFERON-TB GOLD PLUS
Mitogen-NIL: >10 IU/mL
NIL: 0.08 IU/mL
QuantiFERON-TB Gold Plus: NEGATIVE
TB1-NIL: 0.01 IU/mL
TB2-NIL: 0 IU/mL

## 2022-11-21 ENCOUNTER — Other Ambulatory Visit: Payer: Medicare Other

## 2022-11-21 ENCOUNTER — Ambulatory Visit (INDEPENDENT_AMBULATORY_CARE_PROVIDER_SITE_OTHER): Payer: Medicare Other | Admitting: Internal Medicine

## 2022-11-21 ENCOUNTER — Encounter: Payer: Self-pay | Admitting: Internal Medicine

## 2022-11-21 VITALS — BP 110/64 | HR 62 | Ht 70.0 in | Wt 145.0 lb

## 2022-11-21 DIAGNOSIS — K90821 Short bowel syndrome with colon in continuity: Secondary | ICD-10-CM | POA: Diagnosis not present

## 2022-11-21 DIAGNOSIS — K50018 Crohn's disease of small intestine with other complication: Secondary | ICD-10-CM | POA: Diagnosis not present

## 2022-11-21 DIAGNOSIS — E519 Thiamine deficiency, unspecified: Secondary | ICD-10-CM

## 2022-11-21 DIAGNOSIS — R1031 Right lower quadrant pain: Secondary | ICD-10-CM

## 2022-11-21 DIAGNOSIS — Z789 Other specified health status: Secondary | ICD-10-CM

## 2022-11-21 DIAGNOSIS — E43 Unspecified severe protein-calorie malnutrition: Secondary | ICD-10-CM

## 2022-11-21 MED ORDER — HYDROCODONE-ACETAMINOPHEN 5-325 MG PO TABS: 1.0000 | ORAL_TABLET | Freq: Four times a day (QID) | ORAL | 0 refills | Status: DC | PRN

## 2022-11-21 NOTE — Patient Instructions (Addendum)
Your provider has requested that you go to the basement level for lab work before leaving today. Press "B" on the elevator. The lab is located at the first door on the left as you exit the elevator.  Due to recent changes in healthcare laws, you may see the results of your imaging and laboratory studies on MyChart before your provider has had a chance to review them.  We understand that in some cases there may be results that are confusing or concerning to you. Not all laboratory results come back in the same time frame and the provider may be waiting for multiple results in order to interpret others.  Please give [place] 48 hours in order for your provider to thoroughly review all the results before contacting the office for clarification of your results.    We have sent the following medications to your pharmacy for you to pick up at your convenience: Vicodin  Stop Advil per Dr Leone Payor.  Make an appointment with your [facility] dietician.   I appreciate the opportunity to care for you. Stan Head, MD, [facility]

## 2022-11-21 NOTE — Progress Notes (Signed)
[name] 72 y.o. 02-27-1951 [id number]  Assessment & Plan:   Encounter Diagnoses  Name Primary?   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    Thiamine deficiency    RLQ abdominal pain    We need to start therapy for Crohn's again.  Entyvio may not be a good option because of the IV access problems.  Enterography showed some stranding around the ileocolonic anastomosis but not major signs of inflammation of the bowel.  Obviously it could be subtle.  His fecal calprotectin is elevated.  I still wonder if he failed Entyvio I think probably not as I think most of his problems are related to the mesh and reactions to that and the obstruction that was causing.  I am going to check vedolizumab or Entyvio antibodies.  Again I might not use that but I think it would be useful to have that information.  He has been on Humira and 6-MP and Stelara as well as Entyvio in the past.  Those were all pre-Medicare.    I think he is a little bit better his wife agrees.  I think his abdominal pain could be at abdominal wall pain he actually has some on the left as well.   I am going to prescribe some hydrocodone to take at bedtime we had to change pharmacies so that is why it is listed twice.  He should continue his other medications.  I have encouraged him to try to add a protein supplement and I will send that by MyChart as I forgot to put that on his list today.  I want him to call his [facility] dietitian and have a visit with her as well.  I have told him to stop Advil and not use NSAIDs.   Meds ordered this encounter  Medications   DISCONTD: HYDROcodone-acetaminophen (NORCO/VICODIN) 5-325 MG tablet    Sig: Take 1 tablet by mouth every 6 (six) hours as needed for severe pain.    Dispense:  45 tablet    Refill:  0   HYDROcodone-acetaminophen (NORCO/VICODIN)  5-325 MG tablet    Sig: Take 1 tablet by mouth every 6 (six) hours as needed for severe pain.    Dispense:  45 tablet    Refill:  0     Orders Placed This Encounter  Procedures   Vedolizumab and Anti-Vedo Ab   Subjective:  Gastroenterology problem summary   Crohn's disease of the small intestine-ileitis in a patient status post small bowel resection as below: June 2013 47 cm small bowel resection incarcerated ventral hernia Small intestine, resection - SMALL BOWEL WITH EXTENSIVE ACUTE SEROSITIS AND ASSOCIATED FAT NECROSIS AND ADHESIONS. - NO ATYPIA OR MALIGNANCY. - RESECTION MARGINS VIABLE.   Original Crohn's diagnosis October 2018 colonoscopy, budesonide therapy then 6-MP plus Humira early 2019, developed pancreatitis from 6-MP.  Additional budesonide and then weekly Humira but antibodies very high 04/05/2018 and loss of efficacy. Prednisone used as budesonide was not helpful, Stelara tried early 2020 and then moved to [facility] fall 2020   2022 having problems with perineal and then abdominal pain and diarrhea progressed into 2023 and in March of that year CT showing enteritis/ileitis referred to Dr. Drue Dun and had surgery       November 11, 2021 small bowel resection, two 9 cm long sections A. SMALL BOWEL ANASTOMOSIS, RESECTION:             -  Anastomotic site, present.              - Small bowel mucosa with no significant abnormalities.              - Serosa with fibrous adhesions and histiocytic giant cell reaction to mesh material.    B. MESH, REMOVAL:             - Mesh with attached adipose and membranous tissue, gross examination only.    C. SMALL BOWEL ANASTOMOSIS #2, RESECTION:             - Chronic active ileitis with pseudopolyp formation and mucosa ulceration.  - Anastomotic site identified.  - See comment.    D. MESH #2 , REMOVAL:             - Mesh with attached adipose tissue, gross examination only.    Several admissions due to high output ileostomy and need  for hydration and TPN rehab stays, also complicated by abdominal wall abscess requiring drainage.  Was on home TPN.  Dr. Drue Dun did not want to takedown ileostomy. Was staying with his sister in [place] (she could help with TPN) and saw Dr. Peri JeffersonChancy Hurter at [facility]   08/29/2022 preoperative colonoscopy mild diversion colitis noted throughout the colon exam to long Hartmann stump and   08/30/2022 ileostomy reversal at [facility]      Small intestine (ileostomy trim): -Benign skin small intestinal tissue consistent with ostomy site.   This electronic signature is attestation that the pathologist personally reviewed the submitted material(s) and the final diagnosis reflects that evaluation.  Electronically signed by Rogelia Mire, MD on 08/31/2022 at 10:24 AM  Clinical History    Pre-op diagnosis: CROHN'S DISEASE  Gross Description    A.  Label: Small intestine (ileostomy trim)-permanent Stoma size: 2.2 x 2.1 cm Surrounding skin: 0.5 cm wide rim of lightly pigmented skin Attached bowel: 1.5 cm in length by 1.7 cm in diameter; unremarkable Other findings: An additional 4.5 x 2.2 x 1.5 cm mucosal fragment is present; unremarkable    Postop.  Outpatient, he struggled with hydration some Lomotil helping, some question of short-bowel syndrome consideration for TPN but not used   CT-E 11/08/22 IMPRESSION: 1. Interval ileostomy reversal and ileocolic anastomosis. 2. Multiple distal small bowel and colon resections with the remaining portions of the colon situated in the right hemiabdomen. 3. Mild fat stranding about the ileocolic anastomosis and adjacent descending portion of the colon in the right lower quadrant, similar to prior examination. This may be postoperative or reflect ongoing inflammation. 4. No other inflammatory findings of the bowel. No evidence of stricture, obstruction, fistula, or abscess at this time.  Multiple deficiencies-thiamine, potassium iron, vitamin D  detected 10/04/2022 follow-up    Chief Complaint: Abdominal pain  HPI [name] is a 72 year old white man with the problems outlined above, presenting with his wife for follow-up.  He is having several up to 7 stools a day range from watery to soft formed.  He has noted some right lower quadrant pain.  It occurs with movement and bending at times.  He is using Lomotil to 3 times a day.  He has been taking Advil at bedtime because of the pain at sleep he takes trazodone as well.  His wife said he was moving his kayak around a little bit.  He misses paddling in it, [place].  He was not lifting it.  Weight is stable  as below.  Food intake a typical breakfast is eggs with a croissant and sausage he is hydrating.  He has a sandwich and chips for lunch and ribs and sweet potatoes is what he ate last night.  Wt Readings from Last 3 Encounters:  11/21/22 145 lb (65.8 kg)  10/16/22 144 lb (65.3 kg)  10/04/22 145 lb (65.8 kg)     Chemistry      Component Value Date/Time   NA 140 11/15/2022 1436   NA 141 11/29/2020 1001   K 3.6 11/15/2022 1436   CL 103 11/15/2022 1436   CO2 28 11/15/2022 1436   BUN 12 11/15/2022 1436   BUN 11 11/29/2020 1001   CREATININE 0.94 11/15/2022 1436      Component Value Date/Time   CALCIUM 9.0 11/15/2022 1436   ALKPHOS 70 11/15/2022 1436   AST 22 11/15/2022 1436   ALT 13 11/15/2022 1436   BILITOT 0.5 11/15/2022 1436         Component Ref Range & Units 3 wk ago 1 yr ago  Calprotectin, Fecal 0 - 120 ug/g 275 High  697       Allergies  Allergen Reactions   Purinethol [Mercaptopurine] Other (See Comments)    Pancreatitis    Shellfish Allergy Anaphylaxis and Swelling    Can use standard SMOF lipid formulation for TPN without any issue.    Humira [Adalimumab] Other (See Comments)    Developed antibodies   Tape Rash   Wound Dressing Adhesive Rash   Current Meds  Medication Sig   acetaminophen (TYLENOL) 325 MG tablet Take 650 mg by  mouth every 6 (six) hours as needed for moderate pain.   albuterol (VENTOLIN HFA) 108 (90 Base) MCG/ACT inhaler Inhale 2 puffs into the lungs every 6 (six) hours as needed for wheezing or shortness of breath.   busPIRone (BUSPAR) 5 MG tablet Take 5 mg by mouth 2 (two) times daily.   calcium carbonate (TUMS - DOSED IN MG ELEMENTAL CALCIUM) 500 MG chewable tablet Chew 500 mg by mouth 3 (three) times daily as needed for indigestion or heartburn.   diphenoxylate-atropine (LOMOTIL) 2.5-0.025 MG tablet Take 2 tablets by mouth 4 (four) times daily as needed for diarrhea or loose stools.   ferrous sulfate 325 (65 FE) MG tablet Take 1 tablet (325 mg total) by mouth daily with breakfast.   finasteride (PROSCAR) 5 MG tablet Take 5 mg by mouth daily.   hyoscyamine (LEVSIN SL) 0.125 MG SL tablet Place 0.125 mg under the tongue every 6 (six) hours as needed for cramping.   melatonin 3 MG TABS tablet Take 3 mg by mouth at bedtime.   Multiple Vitamin (MULTIVITAMIN WITH MINERALS) TABS tablet Take 1 tablet by mouth daily.   ondansetron (ZOFRAN-ODT) 4 MG disintegrating tablet Dissolve 1 tablet (4 mg total) by mouth every 6 (six) hours as needed for nausea or vomiting.   oxybutynin (DITROPAN) 5 MG tablet Take 1 tablet (5 mg total) by mouth 3 (three) times daily.   pantoprazole (PROTONIX) 40 MG tablet Take 40 mg by mouth 2 (two) times daily.   Potassium Chloride ER 20 MEQ TBCR TAKE 1 TABLET(20 MEQ) BY MOUTH TWICE DAILY   simethicone (MYLICON) 80 MG chewable tablet Chew 80 mg by mouth 4 (four) times daily -  with meals and at bedtime.   tamsulosin (FLOMAX) 0.4 MG CAPS capsule Take 1 capsule (0.4 mg total) by mouth daily after supper.   thiamine (VITAMIN B1) 100 MG tablet Take 1 tablet (  100 mg total) by mouth daily.   traZODone (DESYREL) 100 MG tablet Take 200 mg by mouth at bedtime.   Vitamin D, Ergocalciferol, (DRISDOL) 1.25 MG (50000 UNIT) CAPS capsule Take 1 capsule (50,000 Units total) by mouth every 7 (seven) days.    Past Medical History:  Diagnosis Date   Acute prostatitis 07/24/2007   Qualifier: Diagnosis of  By: Clent Ridges MD, Jeannett Senior A    Acute respiratory failure with hypoxia [facility])    Allergy    mild   Arthritis    osteoarthritis   Aspiration pneumonia (HCC) 05/24/2022   Asthma    Blood transfusion without reported diagnosis    BPH (benign prostatic hypertrophy) with urinary obstruction    Crohn's ileitis (HCC) suspected 05/03/2017   Dilated aortic root (HCC)    noted on echo 08/2012   Diverticulitis of colon    Drug-induced acute pancreatitis without infection or necrosis - from 6 MP 09/24/2017   EPIDIDYMITIS 02/15/2010   Qualifier: Diagnosis of  By: Clent Ridges MD, Tera Mater    GERD (gastroesophageal reflux disease)    H/O: GI bleed    Hemorrhoids    Hepatitis 1975   unknown type    HERPES SIMPLEX INFECTION 10/14/2007   Qualifier: Diagnosis of  By: Clent Ridges MD, Jeannett Senior A    Hiatal hernia    Ileus following gastrointestinal surgery (HCC) 12/26/2011   Long term (current) use of systemic steroids 06/18/2018   Psoriasis    sees Dr. Karene Fry at [facility].   Recurrent ventral incisional hernia 05/10/2012   Short bowel syndrome    SVT (supraventricular tachycardia)    Ulcer 08/21/2013   ileal   Past Surgical History:  Procedure Laterality Date   BOWEL RESECTION  12/19/2011   Procedure: SMALL BOWEL RESECTION;  Surgeon: Mariella Saa, MD;  Location: [facility];  Service: General;  Laterality: N/A;  with anastamosis and insertion mesh   BRONCHOSCOPY     COLON SURGERY  01/2004   x 2   COLONOSCOPY W/ BIOPSIES  04/26/2017   per Dr. Leone Payor, no polyps, benign inflammation, repeat in 5 yrs    CYSTOSCOPY     ESOPHAGOGASTRODUODENOSCOPY     HEMICOLECTOMY     left side, at [facility], diverticulitis   HEMORRHOID BANDING     HERNIA REPAIR     [phone] incisional hernia   ILEOSTOMY     ILEOSTOMY CLOSURE     INSERTION OF MESH  07/31/2012   Procedure: INSERTION OF MESH;  Surgeon: Mariella Saa,  MD;  Location: [facility];  Service: General;;   LAPAROTOMY  12/19/2011   Procedure: EXPLORATORY LAPAROTOMY;  Surgeon: Mariella Saa, MD;  Location: [facility];  Service: General;  Laterality: N/A;   PACEMAKER IMPLANT N/A 12/01/2020   Procedure: PACEMAKER IMPLANT;  Surgeon: Regan Lemming, MD;  Location: [facility] INVASIVE CV LAB;  Service: Cardiovascular;  Laterality: N/A;   PACEMAKER INSERTION Left    TONSILLECTOMY     UPPER GASTROINTESTINAL ENDOSCOPY     VASECTOMY     VENTRAL HERNIA REPAIR  07/31/2012   Procedure: HERNIA REPAIR VENTRAL ADULT;  Surgeon: Mariella Saa, MD;  Location: [facility];  Service: General;  Laterality: N/A;   Social History   Social History Narrative   He is married with 2 sons 1 son is an Acupuncturist and the 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   Review of Systems As per HPI  Objective:   Physical Exam BP 110/64   Pulse 62   Ht 5\' 10"  (1.778 m)   Wt 145 lb (65.8 kg)   BMI 20.81 kg/m  [place] wm nad Lungs cta Cor NL SS2 no rmg Abd scars soft L>RLQ tenderness increase w/ mm tension

## 2022-11-24 ENCOUNTER — Encounter: Payer: Self-pay | Admitting: Internal Medicine

## 2022-11-27 LAB — SERIAL MONITORING

## 2022-11-29 ENCOUNTER — Encounter: Payer: Self-pay | Admitting: Family Medicine

## 2022-11-29 ENCOUNTER — Ambulatory Visit (INDEPENDENT_AMBULATORY_CARE_PROVIDER_SITE_OTHER): Payer: Medicare Other | Admitting: Family Medicine

## 2022-11-29 VITALS — BP 110/70 | HR 58 | Temp 97.5°F | Wt 144.2 lb

## 2022-11-29 DIAGNOSIS — N390 Urinary tract infection, site not specified: Secondary | ICD-10-CM

## 2022-11-29 DIAGNOSIS — G629 Polyneuropathy, unspecified: Secondary | ICD-10-CM

## 2022-11-29 DIAGNOSIS — R300 Dysuria: Secondary | ICD-10-CM

## 2022-11-29 LAB — POC URINALSYSI DIPSTICK (AUTOMATED)
Bilirubin, UA: NEGATIVE
Blood, UA: NEGATIVE
Glucose, UA: NEGATIVE
Ketones, UA: NEGATIVE
Leukocytes, UA: NEGATIVE
Nitrite, UA: NEGATIVE
Protein, UA: POSITIVE — AB
Spec Grav, UA: 1.02 (ref 1.010–1.025)
Urobilinogen, UA: 0.2 E.U./dL
pH, UA: 6 (ref 5.0–8.0)

## 2022-11-29 MED ORDER — CIPROFLOXACIN HCL 500 MG PO TABS: 500.0000 mg | ORAL_TABLET | Freq: Two times a day (BID) | ORAL | 0 refills | Status: AC

## 2022-11-29 MED ORDER — GABAPENTIN 300 MG PO CAPS
300.0000 mg | ORAL_CAPSULE | Freq: Two times a day (BID) | ORAL | 5 refills | Status: DC
Start: 2022-11-29 — End: 2023-01-10

## 2022-11-29 NOTE — Progress Notes (Signed)
   Subjective:    Patient ID: [name], male    DOB: 03/29/1951, 71 y.o.   MRN: [id number]  HPI Here for 2 issues. First about 3 months ago he developed a constant aching pain in both legs and both feet. No numbness or tingling. The pain is worse at night. He has had numerous blood tests in the past few months due to problems with his Crohn's disease, and these included normal glucoses and normal B12. The other issue is urinary burning and urgency that began one week ago. No fever. He is drinking lots of water.    Review of Systems  Constitutional:  Positive for fatigue. Negative for fever.  Respiratory: Negative.    Cardiovascular: Negative.   Gastrointestinal: Negative.   Musculoskeletal:  Positive for myalgias.       Objective:   Physical Exam Constitutional:      Comments: He is frail and quite thin   Cardiovascular:     Rate and Rhythm: Normal rate and regular rhythm.     Pulses: Normal pulses.     Heart sounds: Normal heart sounds.  Pulmonary:     Effort: Pulmonary effort is normal.     Breath sounds: Normal breath sounds.  Abdominal:     Tenderness: There is no right CVA tenderness or left CVA tenderness.  Musculoskeletal:     Comments: Feet are pink and warm. DP and PT pulses are full   Neurological:     Mental Status: He is alert.           Assessment & Plan:  He has a UTI, and we will treat this with 10 days of Cipro. Culture the sample. He has also developed a neuropathy, and we will treat this with Gabapentin 300 mg BID. Recheck in 4 weeks.  Gershon Crane, MD

## 2022-11-30 LAB — URINE CULTURE
MICRO NUMBER:: 14960105
Result:: NO GROWTH
SPECIMEN QUALITY:: ADEQUATE

## 2022-11-30 LAB — VEDOLIZUMAB AND ANTI-VEDO AB
Anti-Vedolizumab Antibody: <25 ng/mL
Vedolizumab: <1.3 ug/mL

## 2022-12-01 ENCOUNTER — Telehealth: Payer: Self-pay | Admitting: Pharmacy Technician

## 2022-12-01 ENCOUNTER — Telehealth: Payer: Self-pay | Admitting: Internal Medicine

## 2022-12-01 ENCOUNTER — Other Ambulatory Visit (HOSPITAL_COMMUNITY): Payer: Self-pay

## 2022-12-01 MED ORDER — RINVOQ 45 MG PO TB24: 45.0000 mg | ORAL_TABLET | Freq: Every day | ORAL | 2 refills | Status: DC

## 2022-12-01 NOTE — Telephone Encounter (Signed)
Spoke to patient about treatment for Crohn's disease.  He does not have antibodies to [facility] but he has poor venous access at this point so IV treatment not the best option.  We are going to try Rinvoq.  I have prescribed as follows  Meds ordered this encounter  Medications   Upadacitinib ER (RINVOQ) 45 MG TB24    Sig: Take 1 tablet (45 mg total) by mouth daily.    Dispense:  30 tablet    Refill:  2   Anticipate a prior authorization request.  Previous treatments have included Humira, Stelara and Entyvio and he had problems after all of these so we have reason to switch.  Sending this to you so you are aware of this, let me know if there is anything I need to do to facilitate this further.

## 2022-12-01 NOTE — Telephone Encounter (Signed)
Patient Advocate Encounter  Received notification from [employer] that prior authorization for [facility] 45MG  is required.   PA submitted on 5.17.24 Key [id number] Status is pending

## 2022-12-01 NOTE — Telephone Encounter (Signed)
PA has been submitted EXPEDITED, and telephone encounter has been created. If approved, what will the maintenance dose be, 30mg  or 15mg ?

## 2022-12-02 ENCOUNTER — Other Ambulatory Visit (HOSPITAL_COMMUNITY): Payer: Self-pay

## 2022-12-02 NOTE — Telephone Encounter (Signed)
Patient Advocate Encounter  Prior Authorization for [facility] 45MG  has been approved with [employer].    PA# [id number] Effective dates: 5.17.24 through 11.17.24  Per [facility] test claim, copay for 56 days supply is $3073.36

## 2022-12-03 NOTE — Telephone Encounter (Signed)
Thanks Monchell Maintenance dose will be 15 mg to start - if he can afford

## 2022-12-04 ENCOUNTER — Other Ambulatory Visit (HOSPITAL_COMMUNITY): Payer: Self-pay

## 2022-12-04 NOTE — Telephone Encounter (Signed)
Inbound call from patient f/u please advise.

## 2022-12-04 NOTE — Telephone Encounter (Signed)
Good morning, Pt does have a high co pay for Rinvoq. I spoke with patient this morning and he would like to proceed with patient assistance. I have faxed over the provider section if you could complete, sign, and fax back to me I will submit PAP to [employer].

## 2022-12-05 NOTE — Telephone Encounter (Signed)
Please see note below. 

## 2022-12-05 NOTE — Telephone Encounter (Signed)
Rinvoq patient access support paperwork completed and faxed back to [employer], CPhT at [phone].

## 2022-12-05 NOTE — Telephone Encounter (Signed)
Noted  

## 2022-12-06 ENCOUNTER — Encounter: Payer: Self-pay | Admitting: Family Medicine

## 2022-12-06 ENCOUNTER — Telehealth: Payer: Self-pay | Admitting: Pharmacy Technician

## 2022-12-06 NOTE — Telephone Encounter (Signed)
Submitted Patient Assistance Application to [employer] for [facility] along with provider portion and documents. Will update patient when we receive a response.  PHONE: [phone] FAX: [phone]

## 2022-12-06 NOTE — Telephone Encounter (Signed)
PAP has been submitted for this patient. Telephone encounter has been created.

## 2022-12-13 ENCOUNTER — Encounter: Payer: Self-pay | Admitting: Family Medicine

## 2022-12-13 ENCOUNTER — Ambulatory Visit (INDEPENDENT_AMBULATORY_CARE_PROVIDER_SITE_OTHER): Payer: Medicare Other | Admitting: Family Medicine

## 2022-12-13 VITALS — BP 108/68 | HR 63 | Temp 97.7°F | Wt 144.6 lb

## 2022-12-13 DIAGNOSIS — B029 Zoster without complications: Secondary | ICD-10-CM

## 2022-12-13 DIAGNOSIS — K50018 Crohn's disease of small intestine with other complication: Secondary | ICD-10-CM

## 2022-12-13 MED ORDER — GABAPENTIN 300 MG PO CAPS
300.0000 mg | ORAL_CAPSULE | Freq: Three times a day (TID) | ORAL | 0 refills | Status: DC
Start: 2022-12-13 — End: 2023-01-10

## 2022-12-13 NOTE — Patient Instructions (Addendum)
A prescription for gabapentin 300 mg 3 times a day was sent to your pharmacy.  You can take this while you are recovering from your shingles outbreak.  Once symptoms resolve return to your twice a day dosing.

## 2022-12-13 NOTE — Progress Notes (Signed)
Established Patient Office Visit   Subjective  Patient ID: [name], male    DOB: 08/17/1950  Age: 72 y.o. MRN: [id number]  Chief Complaint  Patient presents with   Herpes Zoster    Thinks he has shingles, has a lession on right hip and buttocks, scalp has also been tingling. Started about a week ago. Has been using hydrocortisone, has not helped much     Patient is a 72 year old male with pmh sig for Crohn's disease, GERD, asthma, atherosclerosis of aorta, protein calorie malnutrition, short bowel syndrome, SVT, ADHD, BPH, psoriasis, chronic pain syndrome who is followed by Dr. Clent Ridges and seen for acute concern.  Patient with rash on back of right thigh and buttock x 1 week.  Also with 2 vesicular lesions on bottom of scrotum.  Tried hydrocortisone.  Also with tingling in scalp.  States wife had shingles in the past.  Patient unable to receive shingles vaccine in the past due to being on certain GI medications.    Patient Active Problem List   Diagnosis Date Noted   Generalized weakness 05/24/2022   Drug overdose, intentional, initial encounter (HCC) 05/24/2022   Chronic pain syndrome 05/24/2022   Chronic anemia    Chronic prostatitis 05/01/2022   Bladder calculi 05/01/2022   Dysuria    Pelvic floor dysfunction 04/27/2022   Crohn's disease of small intestine with other complication (HCC) 02/13/2022   Protein-calorie malnutrition, severe 01/05/2022   Pacemaker 01/03/2022   External hemorrhoid, thrombosed 05/27/2019   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   Long-term use of immunosuppressant medication  - Entyvio 06/28/2017   Crohn's ileitis (HCC)  05/03/2017   Psoriasis 08/23/2016   Insomnia 04/22/2014   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/06/2012   SVT (supraventricular tachycardia) 08/04/2012   Vitamin D deficiency 06/18/2012   Attention deficit hyperactivity disorder (ADHD) 05/28/2009   BPH (benign prostatic hyperplasia) 07/24/2007   Asthma 03/06/2007   GERD  03/06/2007   DIVERTICULITIS, HX OF 03/06/2007   Past Surgical History:  Procedure Laterality Date   BOWEL RESECTION  12/19/2011   Procedure: SMALL BOWEL RESECTION;  Surgeon: Mariella Saa, MD;  Location: [facility];  Service: General;  Laterality: N/A;  with anastamosis and insertion mesh   BRONCHOSCOPY     COLON SURGERY  01/2004   x 2   COLONOSCOPY W/ BIOPSIES  04/26/2017   per Dr. Leone Payor, no polyps, benign inflammation, repeat in 5 yrs    CYSTOSCOPY     ESOPHAGOGASTRODUODENOSCOPY     HEMICOLECTOMY     left side, at [facility], diverticulitis   HEMORRHOID BANDING     HERNIA REPAIR     4'0981 incisional hernia   ILEOSTOMY     ILEOSTOMY CLOSURE     INSERTION OF MESH  07/31/2012   Procedure: INSERTION OF MESH;  Surgeon: Mariella Saa, MD;  [facility];  Service: General;;   LAPAROTOMY  12/19/2011   Procedure: EXPLORATORY LAPAROTOMY;  Surgeon: Mariella Saa, MD;  [facility];  Service: General;  Laterality: N/A;   PACEMAKER IMPLANT N/A 12/01/2020   Procedure: PACEMAKER IMPLANT;  Surgeon: Regan Lemming, MD;  [facility];  Service: Cardiovascular;  Laterality: N/A;   PACEMAKER INSERTION Left    TONSILLECTOMY     UPPER GASTROINTESTINAL ENDOSCOPY     VASECTOMY     VENTRAL HERNIA REPAIR  07/31/2012   Procedure: HERNIA REPAIR VENTRAL ADULT;  Surgeon: Mariella Saa, MD;  [facility];  Service: General;  Laterality: N/A;   Social History   Tobacco Use   Smoking status: Never   Smokeless tobacco: Never  Vaping Use   Vaping Use: Never used  Substance Use Topics   Alcohol use: Not Currently    Comment: occ   Drug use: Yes    Types: Oxycodone   Family History  Problem Relation Age of Onset   Lung cancer Mother    Leukemia Father    Hypertension Father    Heart disease Father    Heart attack Father    Prostate cancer Father    Prostate cancer Paternal Uncle    Colon cancer Neg Hx    Stomach cancer Neg Hx    Colon polyps  Neg Hx    Esophageal cancer Neg Hx    Rectal cancer Neg Hx    Allergies  Allergen Reactions   Purinethol [Mercaptopurine] Other (See Comments)    Pancreatitis    Shellfish Allergy Anaphylaxis and Swelling    Can use standard SMOF lipid formulation for TPN without any issue.    Humira [Adalimumab] Other (See Comments)    Developed antibodies   Tape Rash   Wound Dressing Adhesive Rash      ROS Negative unless stated above    Objective:     BP 108/68 (BP Location: Left Arm, Patient Position: Sitting, Cuff Size: Normal)   Pulse 63   Temp 97.7 F (36.5 C) (Oral)   Wt 144 lb 9.6 oz (65.6 kg)   SpO2 97%   BMI 20.75 kg/m    Physical Exam Constitutional:      Appearance: Normal appearance.  HENT:     Head: Normocephalic and atraumatic.     Nose: Nose normal.     Mouth/Throat:     Mouth: Mucous membranes are moist.  Cardiovascular:     Rate and Rhythm: Normal rate.  Pulmonary:     Effort: Pulmonary effort is normal.  Musculoskeletal:        General: Normal range of motion.     Comments: Clusters of dry erythematous appearing lesions on posterior right upper leg and right buttock.  Skin:    General: Skin is warm and dry.  Neurological:     Mental Status: He is alert and oriented to person, place, and time.      No results found for any visits on 12/13/22.    Assessment & Plan:  Herpes zoster without complication -     Gabapentin; Take 1 capsule (300 mg total) by mouth 3 (three) times daily.  Dispense: 90 capsule; Refill: 0  Crohn's disease of small intestine with other complication (HCC)  Shingles outbreak resolving.  Given duration of symptoms antiviral medication not indicated at this time.  Discussed increasing regular dose of gabapentin from 300 mg twice daily to 300 mg 3 times daily to help with pain.  After resolution of symptoms patient can return to twice daily dosing.  Return if symptoms worsen or fail to improve.   Deeann Saint, MD

## 2022-12-17 ENCOUNTER — Encounter: Payer: Self-pay | Admitting: Internal Medicine

## 2022-12-18 ENCOUNTER — Other Ambulatory Visit: Payer: Self-pay

## 2022-12-18 NOTE — Telephone Encounter (Signed)
His DMV handicapped form is ready

## 2022-12-18 NOTE — Telephone Encounter (Signed)
Spoke with pt advised that DMV form is ready for pick up, pt requested to mail out to his address. Pt form mailed out per pt request

## 2022-12-19 ENCOUNTER — Other Ambulatory Visit (HOSPITAL_COMMUNITY): Payer: Self-pay

## 2022-12-19 ENCOUNTER — Other Ambulatory Visit: Payer: Self-pay | Admitting: Internal Medicine

## 2022-12-19 ENCOUNTER — Other Ambulatory Visit: Payer: Self-pay

## 2022-12-19 ENCOUNTER — Ambulatory Visit (INDEPENDENT_AMBULATORY_CARE_PROVIDER_SITE_OTHER): Payer: Medicare Other

## 2022-12-19 DIAGNOSIS — I495 Sick sinus syndrome: Secondary | ICD-10-CM

## 2022-12-19 LAB — CUP PACEART REMOTE DEVICE CHECK
Battery Remaining Longevity: 107 mo
Battery Remaining Percentage: 84 %
Battery Voltage: 3.01 V
Brady Statistic AP VP Percent: 1 %
Brady Statistic AP VS Percent: 52 %
Brady Statistic AS VP Percent: 1 %
Brady Statistic AS VS Percent: 46 %
Brady Statistic RA Percent Paced: 52 %
Brady Statistic RV Percent Paced: 1.7 %
Date Time Interrogation Session: 20240604020014
Implantable Lead Connection Status: 753985
Implantable Lead Connection Status: 753985
Implantable Lead Implant Date: 20220518
Implantable Lead Implant Date: 20220518
Implantable Lead Location: 753859
Implantable Lead Location: 753860
Implantable Lead Model: 1944
Implantable Pulse Generator Implant Date: 20220518
Lead Channel Impedance Value: 440 Ohm
Lead Channel Impedance Value: 460 Ohm
Lead Channel Pacing Threshold Amplitude: 0.375 V
Lead Channel Pacing Threshold Amplitude: 0.625 V
Lead Channel Pacing Threshold Pulse Width: 0.5 ms
Lead Channel Pacing Threshold Pulse Width: 0.5 ms
Lead Channel Sensing Intrinsic Amplitude: 4.8 mV
Lead Channel Sensing Intrinsic Amplitude: 7.7 mV
Lead Channel Setting Pacing Amplitude: 0.875
Lead Channel Setting Pacing Amplitude: 1.375
Lead Channel Setting Pacing Pulse Width: 0.5 ms
Lead Channel Setting Sensing Sensitivity: 2 mV
Pulse Gen Model: 2272
Pulse Gen Serial Number: 3925308

## 2022-12-19 MED ORDER — DIPHENOXYLATE-ATROPINE 2.5-0.025 MG PO TABS
2.0000 | ORAL_TABLET | Freq: Four times a day (QID) | ORAL | 2 refills | Status: DC | PRN
  Filled 2022-12-19: qty 180, 23d supply, fill #0

## 2022-12-19 NOTE — Progress Notes (Signed)
Lomotil refill

## 2022-12-20 ENCOUNTER — Other Ambulatory Visit (HOSPITAL_COMMUNITY): Payer: Self-pay

## 2022-12-20 ENCOUNTER — Telehealth: Payer: Self-pay | Admitting: Internal Medicine

## 2022-12-20 NOTE — Telephone Encounter (Signed)
I spoke to [name] and he ask that we resend his lomotil to [facility] in [place]. I have cancelled the lomotil rx at [facility] pharmacy. He is going to contact his PCP to see if they can see him today or tomorrow . The soonest we could work him in is 01/02/23. Please advise. Thanks.

## 2022-12-20 NOTE — Telephone Encounter (Signed)
Add him on with [place] tomorrow - let's double book the first spot and try t see him at 120 or so

## 2022-12-20 NOTE — Telephone Encounter (Signed)
Patient called in requesting to speak with you in regards up coming appt .He said he is having a lot of rectal pain and he can not wait until August.

## 2022-12-20 NOTE — Telephone Encounter (Signed)
I spoke to [place] and he will come tomorrow around 1:10pm for [place] to get him ready.

## 2022-12-21 ENCOUNTER — Encounter: Payer: Self-pay | Admitting: Internal Medicine

## 2022-12-21 ENCOUNTER — Ambulatory Visit (INDEPENDENT_AMBULATORY_CARE_PROVIDER_SITE_OTHER): Payer: Medicare Other | Admitting: Internal Medicine

## 2022-12-21 ENCOUNTER — Ambulatory Visit: Payer: Medicare Other | Admitting: Family Medicine

## 2022-12-21 VITALS — BP 100/70 | HR 71 | Ht 70.0 in | Wt 145.0 lb

## 2022-12-21 DIAGNOSIS — K50018 Crohn's disease of small intestine with other complication: Secondary | ICD-10-CM | POA: Diagnosis not present

## 2022-12-21 DIAGNOSIS — E43 Unspecified severe protein-calorie malnutrition: Secondary | ICD-10-CM | POA: Diagnosis not present

## 2022-12-21 DIAGNOSIS — B0229 Other postherpetic nervous system involvement: Secondary | ICD-10-CM

## 2022-12-21 DIAGNOSIS — K600 Acute anal fissure: Secondary | ICD-10-CM | POA: Diagnosis not present

## 2022-12-21 MED ORDER — AMBULATORY NON FORMULARY MEDICATION: 6 refills | Status: DC

## 2022-12-21 NOTE — Patient Instructions (Signed)
Your provider has prescribed Diltiazem/Lidocanie gel for you. Please follow the directions written on your prescription bottle or given to you specifically by your provider. Since this is a specialty medication and is not readily available at most local pharmacies, we have sent your prescription to:  [facility] information is below: Address: [address] [place], [place] [address]  [phone]  *Please DO NOT go directly from our office to pick up this medication! Give the pharmacy 1 day to process the prescription as this is compounded and takes time to make.   Get the shingles vaccine at your pharmacy.   I appreciate the opportunity to care for you. Stan Head, MD, [facility]

## 2022-12-21 NOTE — Progress Notes (Signed)
[name] 71 y.o. [birth year] [id number]  Assessment & Plan:   Encounter Diagnoses  Name Primary?   Acute posterior anal fissure Yes   Crohn's disease of ileum with other complication (HCC)    Protein-calorie malnutrition, severe (HCC)    Post herpetic neuralgia     Treat anal fissure with diltiazem and lidocaine ointment 3-4 times a day plus local care sitz bath's etc. control of diarrhea should help.  Await initiation of Rinvoq.  Shingrix vaccine recommended.  It is safe for him to take this.  There is not an issue with immunocompromise status in the setting of Entyvio.  It is not a live vaccine.  Rinvoq itself increases the risk of shingles so I want him to get this and he will.  02/08/2023 visit with me for follow-up   Subjective:  Gastroenterology problem summary   Crohn's disease of the small intestine-ileitis in a patient status post small bowel resection as below: June 2013 47 cm small bowel resection incarcerated ventral hernia Small intestine, resection - SMALL BOWEL WITH EXTENSIVE ACUTE SEROSITIS AND ASSOCIATED FAT NECROSIS AND ADHESIONS. - NO ATYPIA OR MALIGNANCY. - RESECTION MARGINS VIABLE.   Original Crohn's diagnosis October 2018 colonoscopy, budesonide therapy then 6-MP plus Humira early 2019, developed pancreatitis from 6-MP.  Additional budesonide and then weekly Humira but antibodies very high 04/05/2018 and loss of efficacy. Prednisone used as budesonide was not helpful, [facility] tried early 2020 and then moved to [facility] fall 2020   2022 having problems with perineal and then abdominal pain and diarrhea progressed into 2023 and in March of that year CT showing enteritis/ileitis referred to Dr. Drue Dun and had surgery       November 11, 2021 small bowel resection, two 9 cm long sections A. SMALL BOWEL ANASTOMOSIS, RESECTION:             - Anastomotic site, present.              - Small bowel mucosa with no significant abnormalities.              -  Serosa with fibrous adhesions and histiocytic giant cell reaction to mesh material.    B. MESH, REMOVAL:             - Mesh with attached adipose and membranous tissue, gross examination only.    C. SMALL BOWEL ANASTOMOSIS #2, RESECTION:             - Chronic active ileitis with pseudopolyp formation and mucosa ulceration.  - Anastomotic site identified.  - See comment.    D. MESH #2 , REMOVAL:             - Mesh with attached adipose tissue, gross examination only.    Several admissions due to high output ileostomy and need for hydration and TPN rehab stays, also complicated by abdominal wall abscess requiring drainage.  Was on home TPN.  Dr. Drue Dun did not want to takedown ileostomy. Was staying with his sister in [place] (she could help with TPN) and saw Dr. Peri JeffersonChancy Hurter at [facility]   08/29/2022 preoperative colonoscopy mild diversion colitis noted throughout the colon exam to long Hartmann stump and   08/30/2022 ileostomy reversal at [facility]      Small intestine (ileostomy trim): -Benign skin small intestinal tissue consistent with ostomy site.   This electronic signature is attestation that the pathologist personally reviewed the submitted material(s) and the final diagnosis reflects that evaluation.  Electronically  signed by Rogelia Mire, MD on 08/31/2022 at 10:24 AM  Clinical History    Pre-op diagnosis: CROHN'S DISEASE  Gross Description    A.  Label: Small intestine (ileostomy trim)-permanent Stoma size: 2.2 x 2.1 cm Surrounding skin: 0.5 cm wide rim of lightly pigmented skin Attached bowel: 1.5 cm in length by 1.7 cm in diameter; unremarkable Other findings: An additional 4.5 x 2.2 x 1.5 cm mucosal fragment is present; unremarkable    Postop.  Outpatient, he struggled with hydration some Lomotil helping, some question of short-bowel syndrome consideration for TPN but not used     CT-E 11/08/22 IMPRESSION: 1. Interval ileostomy reversal and ileocolic  anastomosis. 2. Multiple distal small bowel and colon resections with the remaining portions of the colon situated in the right hemiabdomen. 3. Mild fat stranding about the ileocolic anastomosis and adjacent descending portion of the colon in the right lower quadrant, similar to prior examination. This may be postoperative or reflect ongoing inflammation. 4. No other inflammatory findings of the bowel. No evidence of stricture, obstruction, fistula, or abscess at this time.   Multiple deficiencies-thiamine, potassium iron, vitamin D detected 10/04/2022 follow-up   April and May 2024 visits-Entyvio antibody testing negative, decided to treat with Rinvoq.  He is awaiting that.  Calprotectin 275 in April.  C-reactive protein normal.  Thiamine was low and responded to therapy.  Ferritin 22 B12 and folate normal.  On iron.   Chief Complaint: Rectal pain  HPI  [name] has been having increasing rectal pain and soreness when he sits plus with defecation.  He is trying warm soaks.  Slight bleeding.  He had an increase in diarrhea when he ran out of his Lomotil but we got that refilled though he was out for 5 days.  He is waiting on the Rinvoq to come, he got patient assistance with I will save him $3000 a year essentially and will cost him nothing. Recent shingles on the lower extremity now on gabapentin for postherpetic neuralgia.  Apparently last year he was told he could not get the Shingrix vaccine because he was on Entyvio.  Later this month he is going to [place] and will be with some grandchildren and visiting family.  He continues to have incontinence and still wears depends.  Allergies  Allergen Reactions   Purinethol [Mercaptopurine] Other (See Comments)    Pancreatitis    Shellfish Allergy Anaphylaxis and Swelling    Can use standard SMOF lipid formulation for TPN without any issue.    Humira [Adalimumab] Other (See Comments)    Developed antibodies   Tape Rash    Wound Dressing Adhesive Rash   Current Meds  Medication Sig   acetaminophen (TYLENOL) 325 MG tablet Take 650 mg by mouth every 6 (six) hours as needed for moderate pain.   albuterol (VENTOLIN HFA) 108 (90 Base) MCG/ACT inhaler Inhale 2 puffs into the lungs every 6 (six) hours as needed for wheezing or shortness of breath.   busPIRone (BUSPAR) 5 MG tablet Take 5 mg by mouth 2 (two) times daily.   calcium carbonate (TUMS - DOSED IN MG ELEMENTAL CALCIUM) 500 MG chewable tablet Chew 500 mg by mouth 3 (three) times daily as needed for indigestion or heartburn.   ferrous sulfate 325 (65 FE) MG tablet Take 1 tablet (325 mg total) by mouth daily with breakfast.   finasteride (PROSCAR) 5 MG tablet Take 5 mg by mouth daily.   gabapentin (NEURONTIN) 300 MG capsule Take 1 capsule (300  mg total) by mouth 2 (two) times daily.   gabapentin (NEURONTIN) 300 MG capsule Take 1 capsule (300 mg total) by mouth 3 (three) times daily.   HYDROcodone-acetaminophen (NORCO/VICODIN) 5-325 MG tablet Take 1 tablet by mouth every 6 (six) hours as needed for severe pain.   hyoscyamine (LEVSIN SL) 0.125 MG SL tablet Place 0.125 mg under the tongue every 6 (six) hours as needed for cramping.   melatonin 3 MG TABS tablet Take 3 mg by mouth at bedtime.   Multiple Vitamin (MULTIVITAMIN WITH MINERALS) TABS tablet Take 1 tablet by mouth daily.   ondansetron (ZOFRAN-ODT) 4 MG disintegrating tablet Dissolve 1 tablet (4 mg total) by mouth every 6 (six) hours as needed for nausea or vomiting.   oxybutynin (DITROPAN) 5 MG tablet Take 1 tablet (5 mg total) by mouth 3 (three) times daily.   pantoprazole (PROTONIX) 40 MG tablet Take 40 mg by mouth 2 (two) times daily.   Potassium Chloride ER 20 MEQ TBCR TAKE 1 TABLET(20 MEQ) BY MOUTH TWICE DAILY   simethicone (MYLICON) 80 MG chewable tablet Chew 80 mg by mouth 4 (four) times daily -  with meals and at bedtime.   tamsulosin (FLOMAX) 0.4 MG CAPS capsule Take 1 capsule (0.4 mg total) by mouth  daily after supper.   thiamine (VITAMIN B1) 100 MG tablet Take 1 tablet (100 mg total) by mouth daily.   traZODone (DESYREL) 100 MG tablet Take 200 mg by mouth at bedtime.   Upadacitinib ER (RINVOQ) 45 MG TB24 Take 1 tablet (45 mg total) by mouth daily.   Vitamin D, Ergocalciferol, (DRISDOL) 1.25 MG (50000 UNIT) CAPS capsule Take 1 capsule (50,000 Units total) by mouth every 7 (seven) days.   Past Medical History:  Diagnosis Date   Acute prostatitis 07/24/2007   Qualifier: Diagnosis of  By: Clent Ridges MD, Jeannett Senior A    Acute respiratory failure with hypoxia [facility])    Allergy    mild   Arthritis    osteoarthritis   Aspiration pneumonia (HCC) 05/24/2022   Asthma    Blood transfusion without reported diagnosis    BPH (benign prostatic hypertrophy) with urinary obstruction    Crohn's ileitis (HCC) suspected 05/03/2017   Dilated aortic root (HCC)    noted on echo 08/2012   Diverticulitis of colon    Drug-induced acute pancreatitis without infection or necrosis - from 6 MP 09/24/2017   EPIDIDYMITIS 02/15/2010   Qualifier: Diagnosis of  By: Clent Ridges MD, Tera Mater    GERD (gastroesophageal reflux disease)    H/O: GI bleed    Hemorrhoids    Hepatitis 1975   unknown type    HERPES SIMPLEX INFECTION 10/14/2007   Qualifier: Diagnosis of  By: Clent Ridges MD, Jeannett Senior A    Hiatal hernia    Ileus following gastrointestinal surgery (HCC) 12/26/2011   Long term (current) use of systemic steroids 06/18/2018   Psoriasis    sees Dr. Karene Fry at [facility].   Recurrent ventral incisional hernia 05/10/2012   Shingles    Short bowel syndrome    SVT (supraventricular tachycardia)    Ulcer 08/21/2013   ileal   Past Surgical History:  Procedure Laterality Date   BOWEL RESECTION  12/19/2011   Procedure: SMALL BOWEL RESECTION;  Surgeon: Mariella Saa, MD;  [facility];  Service: General;  Laterality: N/A;  with anastamosis and insertion mesh   BRONCHOSCOPY     COLON SURGERY  01/2004   x 2    COLONOSCOPY W/ BIOPSIES  04/26/2017   per Dr. Leone Payor, no polyps, benign inflammation, repeat in 5 yrs    CYSTOSCOPY     ESOPHAGOGASTRODUODENOSCOPY     HEMICOLECTOMY     left side, at [facility], diverticulitis   HEMORRHOID BANDING     HERNIA REPAIR     1'6109 incisional hernia   ILEOSTOMY     ILEOSTOMY CLOSURE     INSERTION OF MESH  07/31/2012   Procedure: INSERTION OF MESH;  Surgeon: Mariella Saa, MD;  [facility];  Service: General;;   LAPAROTOMY  12/19/2011   Procedure: EXPLORATORY LAPAROTOMY;  Surgeon: Mariella Saa, MD;  [facility];  Service: General;  Laterality: N/A;   PACEMAKER IMPLANT N/A 12/01/2020   Procedure: PACEMAKER IMPLANT;  Surgeon: Regan Lemming, MD;  [facility];  Service: Cardiovascular;  Laterality: N/A;   PACEMAKER INSERTION Left    TONSILLECTOMY     UPPER GASTROINTESTINAL ENDOSCOPY     VASECTOMY     VENTRAL HERNIA REPAIR  07/31/2012   Procedure: HERNIA REPAIR VENTRAL ADULT;  Surgeon: Mariella Saa, MD;  [facility]: [facility];  Service: General;  Laterality: N/A;   Social History   Social History Narrative   He is married with 2 sons 1 son is an Acupuncturist and the 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   Review of Systems See HPI  Objective:   Physical Exam BP (!) 72/48   Pulse 71   Ht 5\' 10"  (1.778 m)   Wt 145 lb (65.8 kg)   BMI 20.81 kg/m recheck BP 100/70 sitting and 110/70 lying Thin, chronically ill  Rectal - mild perianal erythema, tender w/ small sentinel pile  posterior No sign of abscess or fistula

## 2022-12-27 NOTE — Telephone Encounter (Signed)
Received fax/notification from [employer] for [facility] patient assistance. Patient's application has been APPROVED.  Effective Dates: 5.30.24 to 12.31.24.   Phone: [phone] Fax: [phone]

## 2022-12-28 ENCOUNTER — Other Ambulatory Visit: Payer: Self-pay | Admitting: Internal Medicine

## 2022-12-28 MED ORDER — HYDROCODONE-ACETAMINOPHEN 5-325 MG PO TABS: 1.0000 | ORAL_TABLET | Freq: Four times a day (QID) | ORAL | 0 refills | Status: DC | PRN

## 2022-12-30 ENCOUNTER — Other Ambulatory Visit: Payer: Self-pay | Admitting: Internal Medicine

## 2023-01-01 NOTE — Telephone Encounter (Signed)
Sir, do you want me to refill.

## 2023-01-02 ENCOUNTER — Ambulatory Visit: Payer: Medicare Other | Admitting: Internal Medicine

## 2023-01-02 MED ORDER — VITAMIN D 50 MCG (2000 UT) PO TABS: 2000.0000 [IU] | ORAL_TABLET | Freq: Every day | ORAL | Status: DC

## 2023-01-02 NOTE — Telephone Encounter (Signed)
No refill  Start vit D 2000 international unit  daily (OTC - Rx written for list)   Will recheck vit D in July visit

## 2023-01-02 NOTE — Telephone Encounter (Signed)
[name] said that is what he is doing already.

## 2023-01-10 ENCOUNTER — Other Ambulatory Visit: Payer: Self-pay | Admitting: Internal Medicine

## 2023-01-10 ENCOUNTER — Other Ambulatory Visit: Payer: Self-pay | Admitting: Family Medicine

## 2023-01-10 DIAGNOSIS — B029 Zoster without complications: Secondary | ICD-10-CM

## 2023-01-10 NOTE — Telephone Encounter (Signed)
Please advise Sir, thank you. 

## 2023-01-11 NOTE — Progress Notes (Signed)
Remote pacemaker transmission.   

## 2023-01-20 ENCOUNTER — Other Ambulatory Visit: Payer: Self-pay | Admitting: Internal Medicine

## 2023-01-22 ENCOUNTER — Encounter: Payer: Self-pay | Admitting: Internal Medicine

## 2023-01-22 NOTE — Telephone Encounter (Signed)
Please refill if approved Sir, thank you. 

## 2023-01-23 ENCOUNTER — Ambulatory Visit (INDEPENDENT_AMBULATORY_CARE_PROVIDER_SITE_OTHER): Payer: Medicare Other | Admitting: Internal Medicine

## 2023-01-23 ENCOUNTER — Encounter: Payer: Self-pay | Admitting: Internal Medicine

## 2023-01-23 VITALS — BP 98/60 | HR 63 | Ht 70.0 in | Wt 148.0 lb

## 2023-01-23 DIAGNOSIS — K50018 Crohn's disease of small intestine with other complication: Secondary | ICD-10-CM

## 2023-01-23 DIAGNOSIS — Z7960 Long term (current) use of unspecified immunomodulators and immunosuppressants: Secondary | ICD-10-CM

## 2023-01-23 DIAGNOSIS — E43 Unspecified severe protein-calorie malnutrition: Secondary | ICD-10-CM

## 2023-01-23 DIAGNOSIS — K90821 Short bowel syndrome with colon in continuity: Secondary | ICD-10-CM

## 2023-01-23 DIAGNOSIS — K601 Chronic anal fissure: Secondary | ICD-10-CM | POA: Diagnosis not present

## 2023-01-23 MED ORDER — DIPHENOXYLATE-ATROPINE 2.5-0.025 MG PO TABS: 2.0000 | ORAL_TABLET | Freq: Four times a day (QID) | ORAL | 3 refills | Status: DC | PRN

## 2023-01-23 MED ORDER — AMBULATORY NON FORMULARY MEDICATION: 3 refills | Status: DC

## 2023-01-23 NOTE — Patient Instructions (Addendum)
I have changed your fissure treatment to nifedipine and lidocaine  Check with [facility] tomorrow about this.  I fixed the Lomotil Rx  Let me know if that does not work out the way we want.  We will refer you to [facility] about the fissure. Stay tuned and you will hear something.  You are scheduled to follow up in our office on 02-08-23 at 9:10am and 04-13-23 at 10:10am   I appreciate the opportunity to care for you. Iva Boop, MD, [facility]

## 2023-01-23 NOTE — Progress Notes (Signed)
[name] 72 y.o. 11/29/1950 [id number]  Assessment & Plan:   Encounter Diagnoses  Name Primary?   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    Long-term use of immunosuppressant medication  -Rinvoq    The fissure is persistent.  He has been on diltiazem and lidocaine for close to 2 months probably 6 to 7 weeks.  I am going to switch to nifedipine and lidocaine and referred to [facility] surgery colorectal surgeons (Dr. Maisie Fus or [facility]) to consider evaluation and treatment question he needs a Botox injection.  I do not think there is a perianal or perirectal abscess or fistula though that is possible.  He is improving with respect to his Crohn's disease now on Rinvoq he has had some weight gain and less diarrhea.  Continue that refill Lomotil he does not think he can change that at this time and I will correct that so he can get a month supply at a time #240 instead of #120.  Keep follow-up as planned below:  02/08/2023 We made him a late September appointment also.    Subjective:  Gastroenterology problem summary   Crohn's disease of the small intestine-ileitis in a patient status post small bowel resection as below: June 2013 47 cm small bowel resection incarcerated ventral hernia Small intestine, resection - SMALL BOWEL WITH EXTENSIVE ACUTE SEROSITIS AND ASSOCIATED FAT NECROSIS AND ADHESIONS. - NO ATYPIA OR MALIGNANCY. - RESECTION MARGINS VIABLE.   Original Crohn's diagnosis October 2018 colonoscopy, budesonide therapy then 6-MP plus Humira early 2019, developed pancreatitis from 6-MP.  Additional budesonide and then weekly Humira but antibodies very high 04/05/2018 and loss of efficacy. Prednisone used as budesonide was not helpful, [facility] tried early 2020 and then moved to [facility] fall 2020   2022 having problems  with perineal and then abdominal pain and diarrhea progressed into 2023 and in March of that year CT showing enteritis/ileitis referred to Dr. Drue Dun and had surgery       November 11, 2021 small bowel resection, two 9 cm long sections A. SMALL BOWEL ANASTOMOSIS, RESECTION:             - Anastomotic site, present.              - Small bowel mucosa with no significant abnormalities.              - Serosa with fibrous adhesions and histiocytic giant cell reaction to mesh material.    B. MESH, REMOVAL:             - Mesh with attached adipose and membranous tissue, gross examination only.    C. SMALL BOWEL ANASTOMOSIS #2, RESECTION:             - Chronic active ileitis with pseudopolyp formation and mucosa ulceration.  - Anastomotic site identified.  - See comment.    D. MESH #2 , REMOVAL:             - Mesh with attached adipose tissue, gross examination only.    Several admissions due to high output ileostomy and need for hydration and TPN rehab stays, also complicated by abdominal wall abscess requiring drainage.  Was on home TPN.  Dr. Drue Dun did not want to takedown ileostomy. Was staying with his sister in [place] (she could help with TPN) and saw Dr. Peri Jefferson-  Semnani at [facility]   08/29/2022 preoperative colonoscopy mild diversion colitis noted throughout the colon exam to long Hartmann stump and   08/30/2022 ileostomy reversal at [facility]      Small intestine (ileostomy trim): -Benign skin small intestinal tissue consistent with ostomy site.   This electronic signature is attestation that the pathologist personally reviewed the submitted material(s) and the final diagnosis reflects that evaluation.  Electronically signed by Rogelia Mire, MD on 08/31/2022 at 10:24 AM  Clinical History    Pre-op diagnosis: CROHN'S DISEASE  Gross Description    A.  Label: Small intestine (ileostomy trim)-permanent Stoma size: 2.2 x 2.1 cm Surrounding skin: 0.5 cm wide rim of lightly  pigmented skin Attached bowel: 1.5 cm in length by 1.7 cm in diameter; unremarkable Other findings: An additional 4.5 x 2.2 x 1.5 cm mucosal fragment is present; unremarkable    Postop.  Outpatient, he struggled with hydration some Lomotil helping, some question of short-bowel syndrome consideration for TPN but not used     CT-E 11/08/22 IMPRESSION: 1. Interval ileostomy reversal and ileocolic anastomosis. 2. Multiple distal small bowel and colon resections with the remaining portions of the colon situated in the right hemiabdomen. 3. Mild fat stranding about the ileocolic anastomosis and adjacent descending portion of the colon in the right lower quadrant, similar to prior examination. This may be postoperative or reflect ongoing inflammation. 4. No other inflammatory findings of the bowel. No evidence of stricture, obstruction, fistula, or abscess at this time.   Multiple deficiencies-thiamine, potassium iron, vitamin D detected 10/04/2022 follow-up   April and May 2024 visits-Entyvio antibody testing negative, decided to treat with Rinvoq.  He is awaiting that.  Calprotectin 275 in April.  C-reactive protein normal.  Thiamine was low and responded to therapy.  Ferritin 22 B12 and folate normal.  On iron.  Rinvoq begun June 2024   Anal fissure -posterior diagnosed June 2024 treated with diltiazem and lidocaine   Chief Complaint: Anal fissure  HPI 72 year old white man with the problems outlined above, who was diagnosed with anal fissure in early June.  He message saying he is having increasing pain.  No bleeding.  Pain is present throughout most of the day if posterior sitting etc., it intensifies with and after defecation.  Fortunately he was able to get started on his Rinvoq recently and over the past several weeks he has noted less frequency of stools and some forming of the stools.  He continues on Lomotil to 4 times a day.  He thinks he had some dark black stools on a couple of  occasions.  Mostly they are brown.  No Pepto-Bismol.  His Lomotil was filled at 120 but that is only a 15-day supply so he is asking me to correct that.  Wt Readings from Last 3 Encounters:  01/23/23 148 lb (67.1 kg)  12/21/22 145 lb (65.8 kg)  12/13/22 144 lb 9.6 oz (65.6 kg)    Allergies  Allergen Reactions   Purinethol [Mercaptopurine] Other (See Comments)    Pancreatitis    Shellfish Allergy Anaphylaxis and Swelling    Can use standard SMOF lipid formulation for TPN without any issue.    Humira [Adalimumab] Other (See Comments)    Developed antibodies   Tape Rash   Wound Dressing Adhesive Rash   Current Meds  Medication Sig   acetaminophen (TYLENOL) 325 MG tablet Take 650 mg by mouth every 6 (six) hours as needed for moderate pain.   albuterol (VENTOLIN HFA)  108 (90 Base) MCG/ACT inhaler Inhale 2 puffs into the lungs every 6 (six) hours as needed for wheezing or shortness of breath.   AMBULATORY NON FORMULARY MEDICATION Medication Name: nifedipine 0.2%/lidocaine 5% ointment 1:1 mix Apply 3-4 tmes/day into anal canal   busPIRone (BUSPAR) 5 MG tablet Take 5 mg by mouth 2 (two) times daily.   calcium carbonate (TUMS - DOSED IN MG ELEMENTAL CALCIUM) 500 MG chewable tablet Chew 500 mg by mouth 3 (three) times daily as needed for indigestion or heartburn.   Cholecalciferol (VITAMIN D) 50 MCG (2000 UT) tablet Take 1 tablet (2,000 Units total) by mouth daily.   ferrous sulfate 325 (65 FE) MG tablet Take 1 tablet (325 mg total) by mouth daily with breakfast.   finasteride (PROSCAR) 5 MG tablet Take 5 mg by mouth daily.   gabapentin (NEURONTIN) 300 MG capsule TAKE 1 CAPSULE(300 MG) BY MOUTH THREE TIMES DAILY   HYDROcodone-acetaminophen (NORCO/VICODIN) 5-325 MG tablet Take 1 tablet by mouth every 6 (six) hours as needed for severe pain.   hyoscyamine (LEVSIN SL) 0.125 MG SL tablet Place 0.125 mg under the tongue every 6 (six) hours as needed for cramping.   melatonin 3 MG TABS tablet Take  3 mg by mouth at bedtime.   Multiple Vitamin (MULTIVITAMIN WITH MINERALS) TABS tablet Take 1 tablet by mouth daily.   ondansetron (ZOFRAN-ODT) 4 MG disintegrating tablet Dissolve 1 tablet (4 mg total) by mouth every 6 (six) hours as needed for nausea or vomiting.   oxybutynin (DITROPAN) 5 MG tablet Take 1 tablet (5 mg total) by mouth 3 (three) times daily.   pantoprazole (PROTONIX) 40 MG tablet Take 40 mg by mouth 2 (two) times daily.   Potassium Chloride ER 20 MEQ TBCR TAKE 1 TABLET(20 MEQ) BY MOUTH TWICE DAILY   simethicone (MYLICON) 80 MG chewable tablet Chew 80 mg by mouth 4 (four) times daily -  with meals and at bedtime.   tamsulosin (FLOMAX) 0.4 MG CAPS capsule Take 1 capsule (0.4 mg total) by mouth daily after supper.   thiamine (VITAMIN B1) 100 MG tablet Take 1 tablet (100 mg total) by mouth daily.   traZODone (DESYREL) 100 MG tablet Take 200 mg by mouth at bedtime.   Upadacitinib ER (RINVOQ) 45 MG TB24 Take 1 tablet (45 mg total) by mouth daily.   [DISCONTINUED] AMBULATORY NON FORMULARY MEDICATION Medication Name: Diltiazem 2% mixed with Lidocaine 5%: Apply a pea size amount inside rectum 3 to 4 times a day.   [DISCONTINUED] diphenoxylate-atropine (LOMOTIL) 2.5-0.025 MG tablet TAKE 2 TABLETS BY MOUTH 4 (FOUR) TIMES DAILY AS NEEDED FOR DIARRHEA OR LOOSE STOOLS.   Past Medical History:  Diagnosis Date   Acute prostatitis 07/24/2007   Qualifier: Diagnosis of  By: Clent Ridges MD, Jeannett Senior A    Acute respiratory failure with hypoxia [facility])    Allergy    mild   Arthritis    osteoarthritis   Aspiration pneumonia (HCC) 05/24/2022   Asthma    Blood transfusion without reported diagnosis    BPH (benign prostatic hypertrophy) with urinary obstruction    Crohn's ileitis (HCC) suspected 05/03/2017   Dilated aortic root (HCC)    noted on echo 08/2012   Diverticulitis of colon    Drug-induced acute pancreatitis without infection or necrosis - from 6 MP 09/24/2017   EPIDIDYMITIS 02/15/2010    Qualifier: Diagnosis of  By: Clent Ridges MD, Tera Mater    GERD (gastroesophageal reflux disease)    H/O: GI bleed    Hemorrhoids  Hepatitis 1975   unknown type    HERPES SIMPLEX INFECTION 10/14/2007   Qualifier: Diagnosis of  By: Clent Ridges MD, Jeannett Senior A    Hiatal hernia    Ileus following gastrointestinal surgery (HCC) 12/26/2011   Long term (current) use of systemic steroids 06/18/2018   Psoriasis    sees Dr. Karene Fry at [facility].   Recurrent ventral incisional hernia 05/10/2012   Shingles    Short bowel syndrome    SVT (supraventricular tachycardia)    Ulcer 08/21/2013   ileal   Past Surgical History:  Procedure Laterality Date   BOWEL RESECTION  12/19/2011   Procedure: SMALL BOWEL RESECTION;  Surgeon: Mariella Saa, MD;  Location: [facility];  Service: General;  Laterality: N/A;  with anastamosis and insertion mesh   BRONCHOSCOPY     COLON SURGERY  01/2004   x 2   COLONOSCOPY W/ BIOPSIES  04/26/2017   per Dr. Leone Payor, no polyps, benign inflammation, repeat in 5 yrs    CYSTOSCOPY     ESOPHAGOGASTRODUODENOSCOPY     HEMICOLECTOMY     left side, at [facility], diverticulitis   HEMORRHOID BANDING     HERNIA REPAIR     [phone] incisional hernia   ILEOSTOMY     ILEOSTOMY CLOSURE     INSERTION OF MESH  07/31/2012   Procedure: INSERTION OF MESH;  Surgeon: Mariella Saa, MD;  [facility]: [facility];  Service: General;;   LAPAROTOMY  12/19/2011   Procedure: EXPLORATORY LAPAROTOMY;  Surgeon: Mariella Saa, MD;  [facility]: [facility];  Service: General;  Laterality: N/A;   PACEMAKER IMPLANT N/A 12/01/2020   Procedure: PACEMAKER IMPLANT;  Surgeon: Regan Lemming, MD;  Location: [facility];  Service: Cardiovascular;  Laterality: N/A;   PACEMAKER INSERTION Left    TONSILLECTOMY     UPPER GASTROINTESTINAL ENDOSCOPY     VASECTOMY     VENTRAL HERNIA REPAIR  07/31/2012   Procedure: HERNIA REPAIR VENTRAL ADULT;  Surgeon: Mariella Saa, MD;  Location: [facility];  Service:  General;  Laterality: N/A;   Social History   Social History Narrative   He is married with 2 sons 1 son is an Acupuncturist and 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   Review of Systems As per HPI  Objective:   Physical Exam BP 98/60   Pulse 63   Ht 5\' 10"  (1.778 m)   Wt 148 lb (67.1 kg)   BMI 21.24 kg/m  He looks less gaunt with mild weight gain Abdomen is soft and nontender multiple scars present  Rectal exam reveals a normal anoderm today, he is tender posteriorly and there is a palpable fissure though I do not see a sentinel pile, this is posterior.  Anterior nontender.  I elected not to perform a anoscopy due to the discomfort.  I can get my index finger into the anal canal.  I do not palpate nor do I see any signs of abscess or fistula

## 2023-02-08 ENCOUNTER — Other Ambulatory Visit: Payer: Medicare Other

## 2023-02-08 ENCOUNTER — Encounter: Payer: Self-pay | Admitting: Internal Medicine

## 2023-02-08 ENCOUNTER — Ambulatory Visit (INDEPENDENT_AMBULATORY_CARE_PROVIDER_SITE_OTHER): Payer: Medicare Other | Admitting: Internal Medicine

## 2023-02-08 VITALS — BP 130/60 | HR 48 | Ht 70.0 in | Wt 147.0 lb

## 2023-02-08 DIAGNOSIS — K90821 Short bowel syndrome with colon in continuity: Secondary | ICD-10-CM

## 2023-02-08 DIAGNOSIS — K601 Chronic anal fissure: Secondary | ICD-10-CM | POA: Diagnosis not present

## 2023-02-08 DIAGNOSIS — Z7960 Long term (current) use of unspecified immunomodulators and immunosuppressants: Secondary | ICD-10-CM | POA: Diagnosis not present

## 2023-02-08 DIAGNOSIS — E519 Thiamine deficiency, unspecified: Secondary | ICD-10-CM

## 2023-02-08 DIAGNOSIS — E43 Unspecified severe protein-calorie malnutrition: Secondary | ICD-10-CM

## 2023-02-08 DIAGNOSIS — R197 Diarrhea, unspecified: Secondary | ICD-10-CM | POA: Diagnosis not present

## 2023-02-08 DIAGNOSIS — K50018 Crohn's disease of small intestine with other complication: Secondary | ICD-10-CM

## 2023-02-08 MED ORDER — HYDROCODONE-ACETAMINOPHEN 5-325 MG PO TABS: 1.0000 | ORAL_TABLET | Freq: Four times a day (QID) | ORAL | 0 refills | Status: DC | PRN

## 2023-02-08 NOTE — Progress Notes (Signed)
[name] 72 y.o. 1950-07-29 [id number]  Assessment & Plan:   Encounter Diagnoses  Name Primary?   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    Long-term use of immunosuppressant medication  -Rinvoq    Thiamine deficiency    Stay on tx with nifedipine for fissure.  Explained that this will take a while.  Hold Botox injection in reserve.  Orders Placed This Encounter  Procedures   Calprotectin, Fecal   CBC with Differential/Platelet   Comprehensive metabolic panel   Magnesium   Phosphorus   Prealbumin   Vitamin B1   He will do calprotectin level when he changes to the lower dose of Rinvoq.  We will use that to try to objectively measure response.  Still going to need a colonoscopy I think.  I think the question here is how much of his diarrhea is from his short-bowel or "almost short-bowel" versus active Crohn's.  Keep trying to control the diarrhea with current medications.  I think the diarrhea aggravates the anal fissure.  04/13/2023 is his next appointment with me. We will also oh make 1 for early December as well given his problems I like to have appointments booked twice to ensure good follow-up.   He Rinvoq lasted a refill of pain medication today so that is done. Meds ordered this encounter  Medications   HYDROcodone-acetaminophen (NORCO/VICODIN) 5-325 MG tablet    Sig: Take 1 tablet by mouth every 6 (six) hours as needed for severe pain.    Dispense:  45 tablet    Refill:  0     Subjective:   Chief Complaint: Follow-up of Crohn's disease and posterior anal fissure  HPI 72 year old white man with GI medical history as above, currently dealing with active Crohn's disease and suspected short for partial short-bowel syndrome issues and a posterior anal fissure.  He saw Dr. Romie Levee recently and she offered  the possibility of Botox injection but he did not want to except the risk of fecal incontinence that may occur and decided to continue with medical therapy.  I reviewed that note.  He is similar with respect to the pain and soreness that he has with the fissure.  He says he is about 15% better on Rinvoq which was initiated in June.  He has some formed stools at times but then lately has been having more diarrhea.  It kind of comes and goes.  He had an incontinence episode which delayed him today.  That happens maybe once a week though it is much better than it was.  He is following the [employer], he had seen a dietitian there the other year and is following with that particular diet.  Weight has stabilized.  Energy level is better.   Wt Readings from Last 3 Encounters:  02/08/23 147 lb (66.7 kg)  01/23/23 148 lb (67.1 kg)  12/21/22 145 lb (65.8 kg)     Allergies  Allergen Reactions   Purinethol [Mercaptopurine] Other (See Comments)    Pancreatitis    Shellfish Allergy Anaphylaxis and Swelling    Can use standard SMOF lipid formulation for TPN without any issue.    Humira [Adalimumab] Other (See Comments)    Developed antibodies   Tape Rash   Wound Dressing Adhesive Rash   Current Meds  Medication Sig   acetaminophen (TYLENOL) 325  MG tablet Take 650 mg by mouth every 6 (six) hours as needed for moderate pain.   albuterol (VENTOLIN HFA) 108 (90 Base) MCG/ACT inhaler Inhale 2 puffs into the lungs every 6 (six) hours as needed for wheezing or shortness of breath.   AMBULATORY NON FORMULARY MEDICATION Medication Name: nifedipine 0.2%/lidocaine 5% ointment 1:1 mix Apply 3-4 tmes/day into anal canal   busPIRone (BUSPAR) 5 MG tablet Take 5 mg by mouth 2 (two) times daily.   calcium carbonate (TUMS - DOSED IN MG ELEMENTAL CALCIUM) 500 MG chewable tablet Chew 500 mg by mouth 3 (three) times daily as needed for indigestion or heartburn.   Cholecalciferol (VITAMIN D) 50 MCG (2000 UT) tablet  Take 1 tablet (2,000 Units total) by mouth daily.   diphenoxylate-atropine (LOMOTIL) 2.5-0.025 MG tablet Take 2 tablets by mouth 4 (four) times daily as needed for diarrhea or loose stools.   ferrous sulfate 325 (65 FE) MG tablet Take 1 tablet (325 mg total) by mouth daily with breakfast.   finasteride (PROSCAR) 5 MG tablet Take 5 mg by mouth daily.   gabapentin (NEURONTIN) 300 MG capsule TAKE 1 CAPSULE(300 MG) BY MOUTH THREE TIMES DAILY   HYDROcodone-acetaminophen (NORCO/VICODIN) 5-325 MG tablet Take 1 tablet by mouth every 6 (six) hours as needed for severe pain.   hyoscyamine (LEVSIN SL) 0.125 MG SL tablet Place 0.125 mg under the tongue every 6 (six) hours as needed for cramping.   melatonin 3 MG TABS tablet Take 3 mg by mouth at bedtime.   Multiple Vitamin (MULTIVITAMIN WITH MINERALS) TABS tablet Take 1 tablet by mouth daily.   ondansetron (ZOFRAN-ODT) 4 MG disintegrating tablet Dissolve 1 tablet (4 mg total) by mouth every 6 (six) hours as needed for nausea or vomiting.   oxybutynin (DITROPAN) 5 MG tablet Take 1 tablet (5 mg total) by mouth 3 (three) times daily.   pantoprazole (PROTONIX) 40 MG tablet Take 40 mg by mouth 2 (two) times daily.   Potassium Chloride ER 20 MEQ TBCR TAKE 1 TABLET(20 MEQ) BY MOUTH TWICE DAILY   simethicone (MYLICON) 80 MG chewable tablet Chew 80 mg by mouth 4 (four) times daily -  with meals and at bedtime.   tamsulosin (FLOMAX) 0.4 MG CAPS capsule Take 1 capsule (0.4 mg total) by mouth daily after supper.   thiamine (VITAMIN B1) 100 MG tablet Take 1 tablet (100 mg total) by mouth daily.   traZODone (DESYREL) 100 MG tablet Take 200 mg by mouth at bedtime.   Upadacitinib ER (RINVOQ) 45 MG TB24 Take 1 tablet (45 mg total) by mouth daily.   Vitamin D, Ergocalciferol, (DRISDOL) 1.25 MG (50000 UNIT) CAPS capsule Take 1 capsule (50,000 Units total) by mouth every 7 (seven) days.   Past Medical History:  Diagnosis Date   Acute prostatitis 07/24/2007   Qualifier:  Diagnosis of  By: Clent Ridges MD, Jeannett Senior A    Acute respiratory failure with hypoxia [facility])    Allergy    mild   Arthritis    osteoarthritis   Aspiration pneumonia (HCC) 05/24/2022   Asthma    Blood transfusion without reported diagnosis    BPH (benign prostatic hypertrophy) with urinary obstruction    Crohn's ileitis (HCC) suspected 05/03/2017   Dilated aortic root (HCC)    noted on echo 08/2012   Diverticulitis of colon    Drug-induced acute pancreatitis without infection or necrosis - from 6 MP 09/24/2017   EPIDIDYMITIS 02/15/2010   Qualifier: Diagnosis of  By: Clent Ridges MD, Tera Mater  GERD (gastroesophageal reflux disease)    H/O: GI bleed    Hemorrhoids    Hepatitis 1975   unknown type    HERPES SIMPLEX INFECTION 10/14/2007   Qualifier: Diagnosis of  By: Clent Ridges MD, Jeannett Senior A    Hiatal hernia    Ileus following gastrointestinal surgery (HCC) 12/26/2011   Long term (current) use of systemic steroids 06/18/2018   Psoriasis    sees Dr. Karene Fry at [facility].   Rectal fissure    Recurrent ventral incisional hernia 05/10/2012   Shingles    Short bowel syndrome    SVT (supraventricular tachycardia)    Ulcer 08/21/2013   ileal   Past Surgical History:  Procedure Laterality Date   BOWEL RESECTION  12/19/2011   Procedure: SMALL BOWEL RESECTION;  Surgeon: Mariella Saa, MD;  [facility];  Service: General;  Laterality: N/A;  with anastamosis and insertion mesh   BRONCHOSCOPY     COLON SURGERY  01/2004   x 2   COLONOSCOPY W/ BIOPSIES  04/26/2017   per Dr. Leone Payor, no polyps, benign inflammation, repeat in 5 yrs    CYSTOSCOPY     ESOPHAGOGASTRODUODENOSCOPY     HEMICOLECTOMY     left side, at [facility], diverticulitis   HEMORRHOID BANDING     HERNIA REPAIR     [phone] incisional hernia   ILEOSTOMY     ILEOSTOMY CLOSURE     INSERTION OF MESH  07/31/2012   Procedure: INSERTION OF MESH;  Surgeon: Mariella Saa, MD;  [facility];  Service: General;;   LAPAROTOMY   12/19/2011   Procedure: EXPLORATORY LAPAROTOMY;  Surgeon: Mariella Saa, MD;  [facility]: [facility];  Service: General;  Laterality: N/A;   PACEMAKER IMPLANT N/A 12/01/2020   Procedure: PACEMAKER IMPLANT;  Surgeon: Regan Lemming, MD;  [facility]: [facility];  Service: Cardiovascular;  Laterality: N/A;   PACEMAKER INSERTION Left    TONSILLECTOMY     UPPER GASTROINTESTINAL ENDOSCOPY     VASECTOMY     VENTRAL HERNIA REPAIR  07/31/2012   Procedure: HERNIA REPAIR VENTRAL ADULT;  Surgeon: Mariella Saa, MD;  Location: [facility];  Service: General;  Laterality: N/A;   Social History   Social History Narrative   He is married with 2 sons 1 son is an Acupuncturist and 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   Review of Systems See HPI  Objective:   Physical Exam @BP  130/60   Pulse (!) 48   Ht 5\' 10"  (1.778 m)   Wt 147 lb (66.7 kg)   BMI 21.09 kg/m @  General:  NAD Eyes:   anicteric Lungs:  clear Heart::  S1S2 no rubs, murmurs or gallops Abdomen:  soft and nontender, BS+ thin Ext:   no edema, cyanosis or clubbing  Rectal - inspection - looks NL, sl tender posterior no DRE   Data Reviewed:  See HPI

## 2023-02-08 NOTE — Patient Instructions (Addendum)
Your provider has requested that you go to the basement level for lab work before leaving today. Press "B" on the elevator. The lab is located at the first door on the left as you exit the elevator.  TURN IN YOUR STOOL TEST WHEN YOU DO THE DOSAGE CHANGE FOR YOUR RINVOQ.  I appreciate the opportunity to care for you. Stan Head, MD, [facility]

## 2023-02-12 ENCOUNTER — Other Ambulatory Visit: Payer: Medicare Other

## 2023-02-12 ENCOUNTER — Other Ambulatory Visit (INDEPENDENT_AMBULATORY_CARE_PROVIDER_SITE_OTHER): Payer: Medicare Other

## 2023-02-12 DIAGNOSIS — Z7960 Long term (current) use of unspecified immunomodulators and immunosuppressants: Secondary | ICD-10-CM | POA: Diagnosis not present

## 2023-02-12 DIAGNOSIS — E43 Unspecified severe protein-calorie malnutrition: Secondary | ICD-10-CM

## 2023-02-12 DIAGNOSIS — K90821 Short bowel syndrome with colon in continuity: Secondary | ICD-10-CM

## 2023-02-12 DIAGNOSIS — E519 Thiamine deficiency, unspecified: Secondary | ICD-10-CM

## 2023-02-12 DIAGNOSIS — K50018 Crohn's disease of small intestine with other complication: Secondary | ICD-10-CM

## 2023-02-12 LAB — COMPREHENSIVE METABOLIC PANEL
ALT: 23 U/L (ref 0–53)
AST: 34 U/L (ref 0–37)
Albumin: 3.8 g/dL (ref 3.5–5.2)
Alkaline Phosphatase: 67 U/L (ref 39–117)
BUN: 13 mg/dL (ref 6–23)
CO2: 30 mEq/L (ref 19–32)
Calcium: 8.7 mg/dL (ref 8.4–10.5)
Chloride: 102 mEq/L (ref 96–112)
Creatinine, Ser: 1.01 mg/dL (ref 0.40–1.50)
GFR: 75.57 mL/min (ref >60.00)
Glucose, Bld: 87 mg/dL (ref 70–99)
Potassium: 4.1 mEq/L (ref 3.5–5.1)
Sodium: 140 mEq/L (ref 135–145)
Total Bilirubin: 0.5 mg/dL (ref 0.2–1.2)
Total Protein: 6.1 g/dL (ref 6.0–8.3)

## 2023-02-12 LAB — MAGNESIUM: Magnesium: 1.4 mg/dL — ABNORMAL LOW (ref 1.5–2.5)

## 2023-02-12 LAB — CBC WITH DIFFERENTIAL/PLATELET
Basophils Absolute: 0 10*3/uL (ref 0.0–0.1)
Basophils Relative: 0.1 % (ref 0.0–3.0)
Eosinophils Absolute: 0 10*3/uL (ref 0.0–0.7)
Eosinophils Relative: 0.6 % (ref 0.0–5.0)
HCT: 41.1 % (ref 39.0–52.0)
Hemoglobin: 13.6 g/dL (ref 13.0–17.0)
Lymphocytes Relative: 37.2 % (ref 12.0–46.0)
Lymphs Abs: 2 10*3/uL (ref 0.7–4.0)
MCHC: 33.1 g/dL (ref 30.0–36.0)
MCV: 90.4 fl (ref 78.0–100.0)
Monocytes Absolute: 0.3 10*3/uL (ref 0.1–1.0)
Monocytes Relative: 5.7 % (ref 3.0–12.0)
Neutro Abs: 3 10*3/uL (ref 1.4–7.7)
Neutrophils Relative %: 56.4 % (ref 43.0–77.0)
Platelets: 160 10*3/uL (ref 150.0–400.0)
RBC: 4.55 Mil/uL (ref 4.22–5.81)
RDW: 15 % (ref 11.5–15.5)
WBC: 5.4 10*3/uL (ref 4.0–10.5)

## 2023-02-12 LAB — PHOSPHORUS: Phosphorus: 3 mg/dL (ref 2.3–4.6)

## 2023-02-18 ENCOUNTER — Other Ambulatory Visit: Payer: Self-pay | Admitting: Internal Medicine

## 2023-02-18 MED ORDER — MAGNESIUM OXIDE 400 MG PO TABS: 400.0000 mg | ORAL_TABLET | Freq: Two times a day (BID) | ORAL | Status: DC

## 2023-02-20 ENCOUNTER — Ambulatory Visit: Payer: Medicare Other | Admitting: Physician Assistant

## 2023-03-02 ENCOUNTER — Encounter: Payer: Self-pay | Admitting: Family Medicine

## 2023-03-02 ENCOUNTER — Ambulatory Visit (INDEPENDENT_AMBULATORY_CARE_PROVIDER_SITE_OTHER): Payer: Medicare Other | Admitting: Family Medicine

## 2023-03-02 VITALS — BP 98/64 | HR 60 | Temp 97.6°F | Wt 152.2 lb

## 2023-03-02 DIAGNOSIS — B029 Zoster without complications: Secondary | ICD-10-CM | POA: Diagnosis not present

## 2023-03-02 DIAGNOSIS — N390 Urinary tract infection, site not specified: Secondary | ICD-10-CM | POA: Diagnosis not present

## 2023-03-02 LAB — POC URINALSYSI DIPSTICK (AUTOMATED)
Blood, UA: NEGATIVE
Glucose, UA: NEGATIVE
Ketones, UA: NEGATIVE
Leukocytes, UA: NEGATIVE
Nitrite, UA: NEGATIVE
Protein, UA: POSITIVE — AB
Spec Grav, UA: 1.015 (ref 1.010–1.025)
Urobilinogen, UA: 0.2 E.U./dL
pH, UA: 6 (ref 5.0–8.0)

## 2023-03-02 MED ORDER — CIPROFLOXACIN HCL 500 MG PO TABS: 500.0000 mg | ORAL_TABLET | Freq: Two times a day (BID) | ORAL | 0 refills | Status: AC

## 2023-03-02 MED ORDER — VALACYCLOVIR HCL 1 G PO TABS: ORAL_TABLET | ORAL | 2 refills | Status: DC

## 2023-03-02 NOTE — Addendum Note (Signed)
Addended by: Carola Rhine on: 03/02/2023 11:30 AM   Modules accepted: Orders

## 2023-03-02 NOTE — Progress Notes (Signed)
   Subjective:    Patient ID: [name], male    DOB: 1951-06-22, 72 y.o.   MRN: [id number]  HPI Here for 2 issues. First he thinks he has another urinary infection. For the past 3 days he has had urgency to urinate with burning. No fever. He also started having symptoms of a shingles outbreak a few days ago. He describes a burning sensation on the scalp, which he has had before. No visible rash.    Review of Systems  Constitutional: Negative.   Respiratory: Negative.    Cardiovascular: Negative.   Genitourinary:  Positive for dysuria, frequency and urgency. Negative for flank pain and hematuria.       Objective:   Physical Exam Constitutional:      Appearance: He is not ill-appearing.     Comments: Frail   Cardiovascular:     Rate and Rhythm: Normal rate and regular rhythm.     Pulses: Normal pulses.     Heart sounds: Normal heart sounds.  Pulmonary:     Effort: Pulmonary effort is normal.     Breath sounds: Normal breath sounds.  Abdominal:     Tenderness: There is no right CVA tenderness or left CVA tenderness.  Skin:    Comments: The scalp appears normal   Neurological:     Mental Status: He is alert.           Assessment & Plan:  For the UTI, we will treat with 10 days of Cipro BID. Culture the sample. For the shingles, we will treat with 7 days of Valtrex 1000 mg TID. Gershon Crane, MD

## 2023-03-03 LAB — URINE CULTURE
MICRO NUMBER:: 15342102
SPECIMEN QUALITY:: ADEQUATE

## 2023-03-06 ENCOUNTER — Other Ambulatory Visit: Payer: Self-pay | Admitting: Internal Medicine

## 2023-03-06 ENCOUNTER — Encounter: Payer: Self-pay | Admitting: Internal Medicine

## 2023-03-07 NOTE — Telephone Encounter (Signed)
Please advise Sir, thank you. 

## 2023-03-08 ENCOUNTER — Other Ambulatory Visit: Payer: Self-pay | Admitting: Internal Medicine

## 2023-03-08 MED ORDER — DIPHENOXYLATE-ATROPINE 2.5-0.025 MG PO TABS: 2.0000 | ORAL_TABLET | Freq: Four times a day (QID) | ORAL | 5 refills | Status: DC

## 2023-03-08 NOTE — Telephone Encounter (Signed)
Spoke with patient & he stated he was not able to pick up the 240 tablets from the pharmacy, they only gave him the 120. Called pharmacy and confirmed that they do have the most updated prescription on file, and that patient can pick up refills at this time. Pt informed & verbalized all understanding.

## 2023-03-20 ENCOUNTER — Ambulatory Visit (INDEPENDENT_AMBULATORY_CARE_PROVIDER_SITE_OTHER): Payer: Medicare Other

## 2023-03-20 DIAGNOSIS — I495 Sick sinus syndrome: Secondary | ICD-10-CM | POA: Diagnosis not present

## 2023-03-20 LAB — CUP PACEART REMOTE DEVICE CHECK
Battery Remaining Longevity: 103 mo
Battery Remaining Percentage: 81 %
Battery Voltage: 3.01 V
Brady Statistic AP VP Percent: 4.9 %
Brady Statistic AP VS Percent: 60 %
Brady Statistic AS VP Percent: 1 %
Brady Statistic AS VS Percent: 35 %
Brady Statistic RA Percent Paced: 64 %
Brady Statistic RV Percent Paced: 5.6 %
Date Time Interrogation Session: 20240903020015
Implantable Lead Connection Status: 753985
Implantable Lead Connection Status: 753985
Implantable Lead Implant Date: 20220518
Implantable Lead Implant Date: 20220518
Implantable Lead Location: 753859
Implantable Lead Location: 753860
Implantable Lead Model: 1944
Implantable Pulse Generator Implant Date: 20220518
Lead Channel Impedance Value: 450 Ohm
Lead Channel Impedance Value: 490 Ohm
Lead Channel Pacing Threshold Amplitude: 0.375 V
Lead Channel Pacing Threshold Amplitude: 0.5 V
Lead Channel Pacing Threshold Pulse Width: 0.5 ms
Lead Channel Pacing Threshold Pulse Width: 0.5 ms
Lead Channel Sensing Intrinsic Amplitude: 4.5 mV
Lead Channel Sensing Intrinsic Amplitude: 5 mV
Lead Channel Setting Pacing Amplitude: 0.75 V
Lead Channel Setting Pacing Amplitude: 1.375
Lead Channel Setting Pacing Pulse Width: 0.5 ms
Lead Channel Setting Sensing Sensitivity: 2 mV
Pulse Gen Model: 2272
Pulse Gen Serial Number: 3925308

## 2023-03-21 ENCOUNTER — Encounter: Payer: Self-pay | Admitting: Internal Medicine

## 2023-03-22 ENCOUNTER — Other Ambulatory Visit: Payer: Self-pay | Admitting: Internal Medicine

## 2023-03-22 MED ORDER — HYDROCODONE-ACETAMINOPHEN 5-325 MG PO TABS: 1.0000 | ORAL_TABLET | Freq: Four times a day (QID) | ORAL | 0 refills | Status: DC | PRN

## 2023-03-29 NOTE — Progress Notes (Signed)
Remote pacemaker transmission.   

## 2023-04-12 ENCOUNTER — Ambulatory Visit: Payer: Medicare Other | Admitting: Family Medicine

## 2023-04-12 VITALS — BP 119/70 | HR 69 | Wt 153.4 lb

## 2023-04-12 DIAGNOSIS — Z0000 Encounter for general adult medical examination without abnormal findings: Secondary | ICD-10-CM | POA: Diagnosis not present

## 2023-04-12 NOTE — Patient Instructions (Signed)
I really enjoyed getting to talk with you today! I am available on Tuesdays and Thursdays for virtual visits if you have any questions or concerns, or if I can be of any further assistance.   CHECKLIST FROM ANNUAL WELLNESS VISIT:  -Follow up (please call to schedule if not scheduled after visit):   -yearly for annual wellness visit with primary care office  Here is a list of your preventive care/health maintenance measures and the plan for each if any are due:  PLAN For any measures below that may be due:   Health Maintenance  Topic Date Due   Zoster Vaccines- Shingrix (1 of 2) Never done   DTaP/Tdap/Td (2 - Tdap) 07/17/2004   Pneumonia Vaccine 47+ Years old (1 of 1 - PCV) Never done   INFLUENZA VACCINE  02/15/2023   COVID-19 Vaccine (5 - 2023-24 season) 03/18/2023   Medicare Annual Wellness (AWV)  04/11/2024   Colonoscopy  08/29/2032   Hepatitis C Screening  Completed   HPV VACCINES  Aged Out    -See a dentist at least yearly  -Get your eyes checked and then per your eye specialist's recommendations  -Other issues addressed today:   -I have included below further information regarding a healthy whole foods based diet, physical activity guidelines for adults, stress management and opportunities for social connections. I hope you find this information useful.   -----------------------------------------------------------------------------------------------------------------------------------------------------------------------------------------------------------------------------------------------------------  NUTRITION:  https://www.[employer].org/patientsandcaregivers/diet-and-nutrition/what-should-i-eat  -Avoid red meat, dairy, processed meats (like bacon, lunch meat, hotdogs, etc) and foods with chemicals and preservatives (ingredients that you don't recognize)  -Ask your gastroenterologist about an IBD certified nutritionist     EXERCISE GUIDELINES FOR  ADULTS: -if you wish to increase your physical activity, do so gradually and with the approval of your doctor -STOP and seek medical care immediately if you have any chest pain, chest discomfort or trouble breathing when starting or increasing exercise  -move and stretch your body, legs, feet and arms when sitting for long periods -Physical activity guidelines for optimal health in adults: -least 150 minutes per week of aerobic exercise (can talk, but not sing) once approved by your doctor, 20-30 minutes of sustained activity or two 10 minute episodes of sustained activity every day.  -resistance training at least 2 days per week if approved by your doctor -balance exercises 3+ days per week:   Stand somewhere where you have something sturdy to hold onto if you lose balance.    1) lift up on toes, start with 5x per day and work up to 20x   2) stand and lift on leg straight out to the side so that foot is a few inches of the floor, start with 5x each side and work up to 20x each side   3) stand on one foot, start with 5 seconds each side and work up to 20 seconds on each side  If you need ideas or help with getting more active:  -Silver sneakers https://tools.silversneakers.com  -Walk with a Doc: http://www.duncan-williams.com/  -try to include resistance (weight lifting/strength building) and balance exercises twice per week: or the following link for ideas: http://castillo-powell.com/  BuyDucts.dk  STRESS MANAGEMENT: -can try meditating, or just sitting quietly with deep breathing while intentionally relaxing all parts of your body for 5 minutes daily -if you need further help with stress, anxiety or depression please follow up with your primary doctor or contact the wonderful folks at [facility]: [phone]  SOCIAL CONNECTIONS: -options in [place] if you wish to engage in  more  social and exercise related activities:  -Silver sneakers https://tools.silversneakers.com  -Walk with a Doc: http://www.duncan-williams.com/  -Check out the [facility] Active Adults 50+ section on the [place] of [employer] (hiking clubs, book clubs, cards and games, chess, exercise classes, aquatic classes and much more) - see the website for details: https://www.[place].gov/departments/parks-recreation/active-adults50  -YouTube has lots of exercise videos for different ages and abilities as well  -[facility] (a variety of indoor and outdoor inperson activities for adults). [phone]. [address]  -Virtual Online Classes (a variety of topics): see seniorplanet.org or call [phone]  -consider volunteering at a school, hospice center, church, senior center or elsewhere

## 2023-04-12 NOTE — Progress Notes (Addendum)
PATIENT CHECK-IN and HEALTH RISK ASSESSMENT QUESTIONNAIRE:  -completed by phone/video for upcoming Medicare Preventive Visit  Pre-Visit Check-in: 1)Vitals (height, wt, BP, etc) - record in vitals section for visit on day of visit Request home vitals (wt, BP, etc.) and enter into vitals, THEN update Vital Signs SmartPhrase below at the top of the HPI. See below.  2)Review and Update Medications, Allergies PMH, Surgeries, Social history in Epic 3)Hospitalizations in the last year with date/reason? March 2024  4)Review and Update Care Team (patient's specialists) in Epic 5) Complete PHQ9 in Epic  6) Complete Fall Screening in Epic 7)Review all Health Maintenance Due and order under PCP if not done.  Medicare Wellness Patient Questionnaire:  Answer theses question about your habits: Do you drink alcohol? No   Have you ever smoked?No  Do you use smokeless tobacco?No Do you use an illicit drugs?No Do you exercises? Yes IF so, what type and how many days/minutes per week?Walking some He sees GI tomorrow and plans to discuss seeing nutritionist, he has severe chron's and needs help finding ways to get enough calories with shorter bowel syndrome without eating junk as much or foods that aggravate current flare, he can't eat fish.  Typical breakfast: Eggs, Sausage, Croissant Typical lunch: Sandwich Typical dinner: Varies Typical snacks: Protein, Jerky, Ham and bacon  Beverages: Juice Good social connections, grandchildren, family, friends  Answer theses question about you: Can you perform most household chores?Yes  Do you find it hard to follow a conversation in a noisy room?Sometimes, reports has had evaluations Do you often ask people to speak up or repeat themselves?Sometimes Do you feel that you have a problem with memory?Little bit  Do you balance your checkbook and or bank acounts?Yes Do you feel safe at home? Yes  Last dentist visit?More than 1 year  Do you need assistance with any  of the following: Please note if so No  Driving?  Feeding yourself?  Getting from bed to chair?  Getting to the toilet?  Bathing or showering?  Dressing yourself?  Managing money?  Climbing a flight of stairs  Preparing meals?    Do you have Advanced Directives in place (Living Will, Healthcare Power or Attorney)? Yes    Last eye Exam and location? More than 1 year ago   Do you currently use prescribed or non-prescribed narcotic or opioid pain medications?Yes - but only takes one at night  Do you have a history or close family history of breast, ovarian, tubal or peritoneal cancer or a family member with BRCA (breast cancer susceptibility 1 and 2) gene mutations? No  Request home vitals (wt, BP, etc.) and enter into vitals, THEN update Vital Signs SmartPhrase below at the top of the HPI. See below.   Nurse/Assistant Credentials/time stamp:   ----------------------------------------------------------------------------------------------------------------------------------------------------------------------------------------------------------------------  Because this visit was a virtual/telehealth visit, some criteria may be missing or patient reported. Any vitals not documented were not able to be obtained and vitals that have been documented are patient reported.    MEDICARE ANNUAL PREVENTIVE CARE VISIT WITH PROVIDER (Welcome to Medicare, initial annual wellness or annual wellness exam)  Virtual Visit via Video  Note  I connected with AVITAJ SCHOLLER on 04/12/23  by a video enabled telemedicine application and verified that I am speaking with the correct person using two identifiers.  Location patient: home Location provider:work or home office Persons participating in the virtual visit: patient, provider  Concerns and/or follow up today: stable, seeing Dr. Leone Payor tomorrow for his IBD.  See HM section in Epic for other details of completed HM.    ROS: negative for  report of fevers, unintentional weight loss, vision changes, vision loss, hearing loss or change, chest pain, sob, hemoptysis, melena, hematochezia, hematuria, falls, bleeding or bruising, thoughts of suicide or self harm, memory loss  Patient-completed extensive health risk assessment - reviewed and discussed with the patient: See Health Risk Assessment completed with patient prior to the visit either above or in recent phone note. This was reviewed in detailed with the patient today and appropriate recommendations, orders and referrals were placed as needed per Summary below and patient instructions.   Review of Medical History: -PMH, PSH, Family History and current specialty and care providers reviewed and updated and listed below   Patient Care Team: [name], MD as PCP - General (Family Medicine) Regan Lemming, MD as PCP - Electrophysiology (Cardiology)   Past Medical History:  Diagnosis Date   Acute prostatitis 07/24/2007   Qualifier: Diagnosis of  By: Clent Ridges MD, Jeannett Senior A    Acute respiratory failure with hypoxia (HCC)    Allergy    mild   Arthritis    osteoarthritis   Aspiration pneumonia (HCC) 05/24/2022   Asthma    Blood transfusion without reported diagnosis    BPH (benign prostatic hypertrophy) with urinary obstruction    Crohn's ileitis (HCC) suspected 05/03/2017   Dilated aortic root (HCC)    noted on echo 08/2012   Diverticulitis of colon    Drug-induced acute pancreatitis without infection or necrosis - from 6 MP 09/24/2017   EPIDIDYMITIS 02/15/2010   Qualifier: Diagnosis of  By: Clent Ridges MD, Tera Mater    GERD (gastroesophageal reflux disease)    H/O: GI bleed    Hemorrhoids    Hepatitis 1975   unknown type    HERPES SIMPLEX INFECTION 10/14/2007   Qualifier: Diagnosis of  By: Clent Ridges MD, Jeannett Senior A    Hiatal hernia    Ileus following gastrointestinal surgery (HCC) 12/26/2011   Long term (current) use of systemic steroids 06/18/2018   Psoriasis    sees Dr.  Karene Fry at [facility].   Rectal fissure    Recurrent ventral incisional hernia 05/10/2012   Shingles    Short bowel syndrome    SVT (supraventricular tachycardia)    Ulcer 08/21/2013   ileal    Past Surgical History:  Procedure Laterality Date   BOWEL RESECTION  12/19/2011   Procedure: SMALL BOWEL RESECTION;  Surgeon: Mariella Saa, MD;  Location: [facility];  Service: General;  Laterality: N/A;  with anastamosis and insertion mesh   BRONCHOSCOPY     COLON SURGERY  01/2004   x 2   COLONOSCOPY W/ BIOPSIES  04/26/2017   per Dr. Leone Payor, no polyps, benign inflammation, repeat in 5 yrs    CYSTOSCOPY     ESOPHAGOGASTRODUODENOSCOPY     HEMICOLECTOMY     left side, at [facility], diverticulitis   HEMORRHOID BANDING     HERNIA REPAIR     [phone] incisional hernia   ILEOSTOMY     ILEOSTOMY CLOSURE     INSERTION OF MESH  07/31/2012   Procedure: INSERTION OF MESH;  Surgeon: Mariella Saa, MD;  Location: [facility];  Service: General;;   LAPAROTOMY  12/19/2011   Procedure: EXPLORATORY LAPAROTOMY;  Surgeon: Mariella Saa, MD;  Location: [facility];  Service: General;  Laterality: N/A;   PACEMAKER IMPLANT N/A 12/01/2020   Procedure: PACEMAKER IMPLANT;  Surgeon: Regan Lemming, MD;  Location: [facility];  Service: Cardiovascular;  Laterality: N/A;   PACEMAKER INSERTION Left    TONSILLECTOMY     UPPER GASTROINTESTINAL ENDOSCOPY     VASECTOMY     VENTRAL HERNIA REPAIR  07/31/2012   Procedure: HERNIA REPAIR VENTRAL ADULT;  Surgeon: Mariella Saa, MD;  Location: [facility];  Service: General;  Laterality: N/A;    Social History   Socioeconomic History   Marital status: Married    Spouse name: Not on file   Number of children: 2   Years of education: Not on file   Highest education level: Not on file  Occupational History   Occupation: EHS manager-retired    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  Tobacco Use   Smoking status: Never   Smokeless tobacco: Never  Vaping Use    Vaping status: Never Used  Substance and Sexual Activity   Alcohol use: Not Currently    Comment: occ   Drug use: Yes    Types: Oxycodone   Sexual activity: Not on file  Other Topics Concern   Not on file  Social History Narrative   He is married with 2 sons 1 son is an Acupuncturist and the 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 and 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   He is Horticulturist, 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   Rare if any caffeine   Rare alcohol and never smoker   Social Determinants of Corporate investment banker Strain: Low Risk  (08/31/2022)   Received from [facility], [facility]   Overall Financial Resource Strain (CARDIA)    Difficulty of Paying Living Expenses: Not hard at all  Food Insecurity: No Food Insecurity (08/31/2022)   Received from [facility], [facility]   Hunger Vital Sign    Worried About Running Out of Food in the Last Year: Never true    Ran Out of Food in the Last Year: Never true  Transportation Needs: No Transportation Needs (08/31/2022)   Received from [facility], [facility]   PRAPARE - Transportation    Lack of Transportation (Medical): No    Lack of Transportation (Non-Medical): No  Physical Activity: Insufficiently Active (08/31/2022)   [facility], [facility]   Exercise Vital Sign    Days of Exercise per Week: 2 days    Minutes of Exercise per Session: 20 min  Stress: Stress Concern Present (08/31/2022)   Received from [facility], [facility] of Occupational Health - Occupational Stress Questionnaire    Feeling of Stress : Rather much  Social Connections: Unknown (08/31/2022)   Received from [facility], [facility]   Social Connection and Isolation Panel [NHANES]    Frequency of Communication with Friends and Family: More than three times a week    Frequency of Social Gatherings  with Friends and Family: More than three times a week    Attends Religious Services: Never    Database administrator or Organizations: No    Attends Banker Meetings: Never    Marital Status: Not on file  Intimate Partner Violence: Not At Risk (08/31/2022)   Received from [facility], [facility]   Humiliation, Afraid, Rape, and Kick questionnaire    Fear of Current or Ex-Partner: No    Emotionally Abused: No    Physically  Abused: No    Sexually Abused: No    Family History  Problem Relation Age of Onset   Lung cancer Mother    Leukemia Father    Hypertension Father    Heart disease Father    Heart attack Father    Prostate cancer Father    Prostate cancer Paternal Uncle    Colon cancer Neg Hx    Stomach cancer Neg Hx    Colon polyps Neg Hx    Esophageal cancer Neg Hx    Rectal cancer Neg Hx     Current Outpatient Medications on File Prior to Visit  Medication Sig Dispense Refill   acetaminophen (TYLENOL) 325 MG tablet Take 650 mg by mouth every 6 (six) hours as needed for moderate pain.     albuterol (VENTOLIN HFA) 108 (90 Base) MCG/ACT inhaler Inhale 2 puffs into the lungs every 6 (six) hours as needed for wheezing or shortness of breath. 6.7 g 2   AMBULATORY NON FORMULARY MEDICATION Medication Name: nifedipine 0.2%/lidocaine 5% ointment 1:1 mix Apply 3-4 tmes/day into anal canal 30 g 3   busPIRone (BUSPAR) 5 MG tablet Take 5 mg by mouth daily at 2 PM.     calcium carbonate (TUMS - DOSED IN MG ELEMENTAL CALCIUM) 500 MG chewable tablet Chew 500 mg by mouth 3 (three) times daily as needed for indigestion or heartburn.     Cholecalciferol (VITAMIN D) 50 MCG (2000 UT) tablet Take 1 tablet (2,000 Units total) by mouth daily.     diphenoxylate-atropine (LOMOTIL) 2.5-0.025 MG tablet Take 2 tablets by mouth 4 (four) times daily. 240 tablet 5   ferrous sulfate 325 (65 FE) MG tablet Take 1 tablet (325 mg total) by mouth daily with breakfast.  3   finasteride  (PROSCAR) 5 MG tablet Take 5 mg by mouth daily.     gabapentin (NEURONTIN) 300 MG capsule TAKE 1 CAPSULE(300 MG) BY MOUTH THREE TIMES DAILY 270 capsule 0   HYDROcodone-acetaminophen (NORCO/VICODIN) 5-325 MG tablet Take 1 tablet by mouth every 6 (six) hours as needed for severe pain. 20 tablet 0   hyoscyamine (LEVSIN SL) 0.125 MG SL tablet Place 0.125 mg under the tongue every 6 (six) hours as needed for cramping.     magnesium oxide (MAG-OX) 400 MG tablet Take 1 tablet (400 mg total) by mouth 2 (two) times daily.     melatonin 3 MG TABS tablet Take 3 mg by mouth at bedtime.     Multiple Vitamin (MULTIVITAMIN WITH MINERALS) TABS tablet Take 1 tablet by mouth daily.     ondansetron (ZOFRAN-ODT) 4 MG disintegrating tablet Dissolve 1 tablet (4 mg total) by mouth every 6 (six) hours as needed for nausea or vomiting. 20 tablet 0   pantoprazole (PROTONIX) 40 MG tablet Take 40 mg by mouth daily.     Potassium Chloride ER 20 MEQ TBCR TAKE 1 TABLET(20 MEQ) BY MOUTH TWICE DAILY 60 tablet 1   simethicone (MYLICON) 80 MG chewable tablet Chew 80 mg by mouth 4 (four) times daily -  with meals and at bedtime.     thiamine (VITAMIN B1) 100 MG tablet Take 1 tablet (100 mg total) by mouth daily. 90 tablet 0   traZODone (DESYREL) 100 MG tablet Take 200 mg by mouth at bedtime.     Upadacitinib ER (RINVOQ) 45 MG TB24 Take 1 tablet (45 mg total) by mouth daily. (Patient taking differently: Take 15 mg by mouth daily.) 30 tablet 2   valACYclovir (VALTREX) 1000 MG tablet  Take one tablet 3 times a day for 7 days as needed for shingles flares 100 tablet 2   No current facility-administered medications on file prior to visit.    Allergies  Allergen Reactions   Purinethol [Mercaptopurine] Other (See Comments)    Pancreatitis    Shellfish Allergy Anaphylaxis and Swelling    Can use standard SMOF lipid formulation for TPN without any issue.    Humira [Adalimumab] Other (See Comments)    Developed antibodies   Tape Rash    Wound Dressing Adhesive Rash       Physical Exam Vitals requested from patient and listed below if patient had equipment and was able to obtain at home for this virtual visit: Vitals:   04/12/23 1425  BP: 119/70  Pulse: 69   Estimated body mass index is 22.01 kg/m as calculated from the following:   Height as of 02/08/23: 5\' 10"  (1.778 m).   Weight as of this encounter: 153 lb 6.4 oz (69.6 kg).  EKG (optional): deferred due to virtual visit  GENERAL: alert, oriented, no acute distress detected; full vision exam deferred due to pandemic and/or virtual encounter  HEENT: atraumatic, conjunttiva clear, no obvious abnormalities on inspection of external nose and ears  NECK: normal movements of the head and neck  LUNGS: on inspection no signs of respiratory distress, breathing rate appears normal, no obvious gross SOB, gasping or wheezing  CV: no obvious cyanosis  MS: moves all visible extremities without noticeable abnormality  PSYCH/NEURO: pleasant and cooperative, no obvious depression or anxiety, speech and thought processing grossly intact, Cognitive function grossly intact  Flowsheet Row Office Visit from 04/12/2023 in [facility] [place]  PHQ-9 Total Score 5           04/12/2023    2:30 PM 03/02/2023   11:28 AM 03/02/2023   10:43 AM 04/10/2022   10:51 AM 01/18/2022    2:29 PM  Depression screen PHQ 2/9  Decreased Interest 0 1 0 0 1  Down, Depressed, Hopeless 0 1 2 0 1  PHQ - 2 Score 0 2 2 0 2  Altered sleeping 0 2 0  1  Tired, decreased energy 3 1 3  3   Change in appetite 0 0 0  3  Feeling bad or failure about yourself  0 0 0  1  Trouble concentrating 1 0 2  0  Moving slowly or fidgety/restless 1 0 1  2  Suicidal thoughts 0 0 0  1  PHQ-9 Score 5 5 8  13        06/22/2022    7:35 PM 06/23/2022    9:35 AM 03/02/2023   10:42 AM 04/12/2023   10:20 AM 04/12/2023    2:30 PM  Fall Risk  Falls in the past year?   0 0 0  Was there an injury  with Fall?   0 0 0  Fall Risk Category Calculator   0 0 0  (RETIRED) Patient Fall Risk Level Moderate fall risk Moderate fall risk     Patient at Risk for Falls Due to   No Fall Risks  No Fall Risks  Fall risk Follow up   Falls evaluation completed  Falls evaluation completed     SUMMARY AND PLAN:  Encounter for Medicare annual wellness exam   Discussed applicable health maintenance/preventive health measures and advised and referred or ordered per patient preferences: -discussed all measures due an how to get each vaccine, advised to let us know when  he gets vaccines so that we can update his record Health Maintenance  Topic Date Due   Zoster Vaccines- Shingrix (1 of 2) Never done   DTaP/Tdap/Td (2 - Tdap) 07/17/2004   Pneumonia Vaccine 72+ Years old (1 of 1 - PCV) Never done   INFLUENZA VACCINE  02/15/2023   COVID-19 Vaccine (5 - 2023-24 season) 03/18/2023   Medicare Annual Wellness (AWV)  04/11/2024   Colonoscopy  08/29/2032   Hepatitis C Screening  Completed   HPV VACCINES  Aged [employer] and counseling on the following was provided based on the above review of health and a plan/checklist for the patient, along with additional information discussed, was provided for the patient in the patient instructions :   -Provided  safe balance exercises that can be done at home to improve balance and discussed exercise guidelines for adults with include balance exercises at least 3 days per week.  -Advised and counseled on a healthy lifestyle - including the importance of a healthy diet, regular physical activity, social connections and stress management. -Reviewed patient's current diet. He would benefit from seeing a nutritionist skilled and knowledgeable in helping crohn's patients. Offered referral. He plans to ask Dr. Leone Payor at his upcoming visit. It would be great for him to find a way to eat less ultraprocessed meats and more nutrient dense foods but needs someone skilled  in dealing with patients in active crohn's flare - an IBD certified nutritionist. Spent a long time discussing some things he could try, healthy protein and nutrient dense foods, flare vs non-flare diets options.  -reviewed patient's current physical activity level and discussed exercise guidelines for adults. Discussed community resources and ideas for safe exercise at home to assist in meeting exercise guideline recommendations in a safe and healthy way.  -Advise yearly dental visits at minimum and regular eye exams -discussed opioid use and risks, he seems to be aware of risks and reports is trying to use as little as possible.    Follow up: see patient instructions   Patient Instructions  I really enjoyed getting to talk with you today! I am available on Tuesdays and Thursdays for virtual visits if you have any questions or concerns, or if I can be of any further assistance.   CHECKLIST FROM ANNUAL WELLNESS VISIT:  -Follow up (please call to schedule if not scheduled after visit):   -yearly for annual wellness visit with primary care office  Here is a list of your preventive care/health maintenance measures and the plan for each if any are due:  PLAN For any measures below that may be due:   Health Maintenance  Topic Date Due   Zoster Vaccines- Shingrix (1 of 2) Never done   DTaP/Tdap/Td (2 - Tdap) 07/17/2004   Pneumonia Vaccine 70+ Years old (1 of 1 - PCV) Never done   INFLUENZA VACCINE  02/15/2023   COVID-19 Vaccine (5 - 2023-24 season) 03/18/2023   Medicare Annual Wellness (AWV)  04/11/2024   Colonoscopy  08/29/2032   Hepatitis C Screening  Completed   HPV VACCINES  Aged Out    -See a dentist at least yearly  -Get your eyes checked and then per your eye specialist's recommendations  -Other issues addressed today:   -I have included below further information regarding a healthy whole foods based diet, physical activity guidelines for adults, stress management and  opportunities for social connections. I hope you find this information useful.   -----------------------------------------------------------------------------------------------------------------------------------------------------------------------------------------------------------------------------------------------------------  NUTRITION:  https://www.crohnscolitisfoundation.org/patientsandcaregivers/diet-and-nutrition/what-should-i-eat  -  Avoid red meat, dairy, processed meats (like bacon, lunch meat, hotdogs, etc) and foods with chemicals and preservatives (ingredients that you don't recognize)  -Ask your gastroenterologist about an IBD certified nutritionist     EXERCISE GUIDELINES FOR ADULTS: -if you wish to increase your physical activity, do so gradually and with the approval of your doctor -STOP and seek medical care immediately if you have any chest pain, chest discomfort or trouble breathing when starting or increasing exercise  -move and stretch your body, legs, feet and arms when sitting for long periods -Physical activity guidelines for optimal health in adults: -least 150 minutes per week of aerobic exercise (can talk, but not sing) once approved by your doctor, 20-30 minutes of sustained activity or two 10 minute episodes of sustained activity every day.  -resistance training at least 2 days per week if approved by your doctor -balance exercises 3+ days per week:   Stand somewhere where you have something sturdy to hold onto if you lose balance.    1) lift up on toes, start with 5x per day and work up to 20x   2) stand and lift on leg straight out to the side so that foot is a few inches of the floor, start with 5x each side and work up to 20x each side   3) stand on one foot, start with 5 seconds each side and work up to 20 seconds on each side  If you need ideas or help with getting more active:  -Silver sneakers https://tools.silversneakers.com  -Walk with a  Doc: http://www.duncan-williams.com/  -try to include resistance (weight lifting/strength building) and balance exercises twice per week: or the following link for ideas: http://castillo-powell.com/  BuyDucts.dk  STRESS MANAGEMENT: -can try meditating, or just sitting quietly with deep breathing while intentionally relaxing all parts of your body for 5 minutes daily -if you need further help with stress, anxiety or depression please follow up with your primary doctor or contact the wonderful folks at [facility]: [phone]  SOCIAL CONNECTIONS: -options in [place] if you wish to engage in more social and exercise related activities:  -Silver sneakers https://tools.silversneakers.com  -Walk with a Doc: http://www.duncan-williams.com/  -Check out the [facility] Active Adults 50+ section on the [place] of [employer] (hiking clubs, book clubs, cards and games, chess, exercise classes, aquatic classes and much more) - see the website for details: https://www.[place].gov/departments/parks-recreation/active-adults50  -YouTube has lots of exercise videos for different ages and abilities as well  -[facility] (a variety of indoor and outdoor inperson activities for adults). [phone]. [address].  -Virtual Online Classes (a variety of topics): see seniorplanet.org or call [phone]  -consider volunteering at a school, hospice center, church, senior center or elsewhere           Terressa Koyanagi, DO

## 2023-04-12 NOTE — Progress Notes (Signed)
Patient unable to obtain vitals due to telehealth visit

## 2023-04-13 ENCOUNTER — Ambulatory Visit (INDEPENDENT_AMBULATORY_CARE_PROVIDER_SITE_OTHER): Payer: Medicare Other | Admitting: Internal Medicine

## 2023-04-13 ENCOUNTER — Encounter: Payer: Self-pay | Admitting: Internal Medicine

## 2023-04-13 VITALS — BP 116/76 | HR 62 | Ht 70.0 in | Wt 157.0 lb

## 2023-04-13 DIAGNOSIS — Z7960 Long term (current) use of unspecified immunomodulators and immunosuppressants: Secondary | ICD-10-CM | POA: Diagnosis not present

## 2023-04-13 DIAGNOSIS — E519 Thiamine deficiency, unspecified: Secondary | ICD-10-CM

## 2023-04-13 DIAGNOSIS — K90821 Short bowel syndrome with colon in continuity: Secondary | ICD-10-CM | POA: Diagnosis not present

## 2023-04-13 DIAGNOSIS — E43 Unspecified severe protein-calorie malnutrition: Secondary | ICD-10-CM

## 2023-04-13 DIAGNOSIS — K50018 Crohn's disease of small intestine with other complication: Secondary | ICD-10-CM

## 2023-04-13 DIAGNOSIS — K601 Chronic anal fissure: Secondary | ICD-10-CM | POA: Diagnosis not present

## 2023-04-13 DIAGNOSIS — R198 Other specified symptoms and signs involving the digestive system and abdomen: Secondary | ICD-10-CM

## 2023-04-13 MED ORDER — HYDROCODONE-ACETAMINOPHEN 5-325 MG PO TABS: 1.0000 | ORAL_TABLET | Freq: Four times a day (QID) | ORAL | 0 refills | Status: DC | PRN

## 2023-04-13 NOTE — Addendum Note (Signed)
Addended by: Iva Boop on: 04/13/2023 06:02 PM   Modules accepted: Orders

## 2023-04-13 NOTE — Progress Notes (Addendum)
[name] 72 y.o. Mar 02, 1951 [id number]  Assessment & Plan:   Encounter Diagnoses  Name Primary?   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    Chronic posterior anal fissure    Long-term use of immunosuppressant medication  -Rinvoq    Thiamine deficiency    Protein-calorie malnutrition, severe (HCC) - improved    Abdominal weakness     He seems improved overall.  Plan to do lab reassessment including lipids since he is on the Rinvoq.  I have given him the name of [name] as a possible dietitian to try.  Agree with Dr. Selena Callahan that he should avoid ultra processed foods.  He has gained significant weight and it would be advisable to move away from these processed foods and sugars as possible.    PT referral for his lower abdominal symptoms and abdominal weakness.   He may need 30 mg dose of Rinvoq depending upon clinical course and lab follow-up.  He is still not inclined to pursue a colonoscopy.   Orders Placed This Encounter  Procedures   Calprotectin, Fecal   Prealbumin   Vitamin B1   Phosphorus   Lipid panel   Comp Met (CMET)   Magnesium   Ambulatory referral to Physical Therapy   Meds ordered this encounter  Medications   HYDROcodone-acetaminophen (NORCO/VICODIN) 5-325 MG tablet    Sig: Take 1 tablet by mouth every 6 (six) hours as needed for severe pain.    Dispense:  20 tablet    Refill:  0     06/25/2023 is his next visit with me.  Subjective:  Gastroenterology problem summary   Crohn's disease of the small intestine-ileitis in a patient status post small bowel resection as below: June 2013 47 cm small bowel resection incarcerated ventral hernia Small intestine, resection - SMALL BOWEL WITH EXTENSIVE ACUTE SEROSITIS AND ASSOCIATED FAT NECROSIS AND ADHESIONS. - NO ATYPIA OR MALIGNANCY. - RESECTION MARGINS VIABLE.   Original Crohn's diagnosis October 2018 colonoscopy,  budesonide therapy then 6-MP plus Humira early 2019, developed pancreatitis from 6-MP.  Additional budesonide and then weekly Humira but antibodies very high 04/05/2018 and loss of efficacy. Prednisone used as budesonide was not helpful, Stelara tried early 2020 and then moved to [facility] fall 2020   2022 having problems with perineal and then abdominal pain and diarrhea progressed into 2023 and in March of that year CT showing enteritis/ileitis referred to Dr. Drue Callahan and had surgery       November 11, 2021 small bowel resection, two 9 cm long sections A. SMALL BOWEL ANASTOMOSIS, RESECTION:             - Anastomotic site, present.              - Small bowel mucosa with no significant abnormalities.              - Serosa with fibrous adhesions and histiocytic giant cell reaction to mesh material.    B. MESH, REMOVAL:             - Mesh with attached adipose and membranous tissue, gross examination only.    C. SMALL BOWEL ANASTOMOSIS #2, RESECTION:             - Chronic active ileitis with pseudopolyp formation and mucosa ulceration.  - Anastomotic site identified.  - See comment.    D. MESH #2 , REMOVAL:             -  Mesh with attached adipose tissue, gross examination only.    Several admissions due to high output ileostomy and need for hydration and TPN rehab stays, also complicated by abdominal wall abscess requiring drainage.  Was on home TPN.  Dr. Drue Callahan did not want to takedown ileostomy. Was staying with his sister in [place] (she could help with TPN) and saw Dr. Peri JeffersonChancy Callahan at [facility]   08/29/2022 preoperative colonoscopy mild diversion colitis noted throughout the colon exam to long Hartmann stump and   08/30/2022 ileostomy reversal at [facility]      Small intestine (ileostomy trim): -Benign skin small intestinal tissue consistent with ostomy site.   This electronic signature is attestation that the pathologist personally reviewed the submitted material(s) and the final  diagnosis reflects that evaluation.  Electronically signed by Justin Mire, MD on 08/31/2022 at 10:24 AM  Clinical History    Pre-op diagnosis: CROHN'S DISEASE  Gross Description    A.  Label: Small intestine (ileostomy trim)-permanent Stoma size: 2.2 x 2.1 cm Surrounding skin: 0.5 cm wide rim of lightly pigmented skin Attached bowel: 1.5 cm in length by 1.7 cm in diameter; unremarkable Other findings: An additional 4.5 x 2.2 x 1.5 cm mucosal fragment is present; unremarkable    Postop.  Outpatient, he struggled with hydration some Lomotil helping, some question of short-bowel syndrome consideration for TPN but not used     CT-E 11/08/22 IMPRESSION: 1. Interval ileostomy reversal and ileocolic anastomosis. 2. Multiple distal small bowel and colon resections with the remaining portions of the colon situated in the right hemiabdomen. 3. Mild fat stranding about the ileocolic anastomosis and adjacent descending portion of the colon in the right lower quadrant, similar to prior examination. This may be postoperative or reflect ongoing inflammation. 4. No other inflammatory findings of the bowel. No evidence of stricture, obstruction, fistula, or abscess at this time.   Multiple deficiencies-thiamine, potassium iron, vitamin D detected 10/04/2022 follow-up   April and May 2024 visits-Entyvio antibody testing negative, decided to treat with Rinvoq.  He is awaiting that.  Calprotectin 275 in April.  C-reactive protein normal.  Thiamine was low and responded to therapy.  Ferritin 22 B12 and folate normal.  On iron.   Rinvoq begun June 2024 45 mg daily x 2 months then 15 mg daily Calprotectin February 12, 2023 132   Anal fissure -posterior diagnosed June 2024 treated with diltiazem and lidocaine and then nifedipine    ------------------------------------------------------------------------------------------ Chief Complaint: Follow-up of Crohn's disease some lower abdominal  pain  HPI [name] is a 72 year old white man with the above GI history who returns for follow-up after July 25 visit.  At that time fissure was predominant issue he is continuing on treatment with nifedipine for that and says it is okay not worse about the same but tolerable it seems like.  He is concerned about some lower abdominal discomfort.  He is gaining some weight and is more active and has had some lower abdominal discomfort.  He feels like there is more "inflammation inside" since moving from 45 mg to 15 mg Rinvoq.  Stools are formed more.  He continues on Lomotil and he still takes hydrocodone at night for pain.  By the end of the day he is kind of worn out and feels worse but overall the trend is towards improvement.  He is asking for refill in his narcotics as well.  He is wondering about going to physical therapy to try to help with lower  abdominal pain and is wondering if he has recurrent hernia issues.  Recall that he had had multiple hernia problems and surgeries and then eventually had to have the mesh removed as outlined above.  He had his wellness visit for Medicare with Dr. Selena Callahan and she mentioned avoiding processed foods.  He had a terrible appetite when he returned to me previously and was eating what ever he could and was also very thin and was trying to gain weight.  He has been trying to follow-up with the [facility] Crohn's/IBD dietitian but she wants an in person visit and he is not inclined to do that. Wt Readings from Last 3 Encounters:  04/13/23 157 lb (71.2 kg)  04/12/23 153 lb 6.4 oz (69.6 kg)  03/02/23 152 lb 3.2 oz (69 kg)     Allergies  Allergen Reactions   Purinethol [Mercaptopurine] Other (See Comments)    Pancreatitis    Shellfish Allergy Anaphylaxis and Swelling    Can use standard SMOF lipid formulation for TPN without any issue.    Humira [Adalimumab] Other (See Comments)    Developed antibodies   Tape Rash   Wound Dressing Adhesive Rash   Current Meds   Medication Sig   acetaminophen (TYLENOL) 325 MG tablet Take 650 mg by mouth every 6 (six) hours as needed for moderate pain.   albuterol (VENTOLIN HFA) 108 (90 Base) MCG/ACT inhaler Inhale 2 puffs into the lungs every 6 (six) hours as needed for wheezing or shortness of breath.   AMBULATORY NON FORMULARY MEDICATION Medication Name: nifedipine 0.2%/lidocaine 5% ointment 1:1 mix Apply 3-4 tmes/day into anal canal   ascorbic acid (VITAMIN C) 250 MG CHEW Chew by mouth.   busPIRone (BUSPAR) 5 MG tablet Take 5 mg by mouth daily at 2 PM.   calcium carbonate (TUMS - DOSED IN MG ELEMENTAL CALCIUM) 500 MG chewable tablet Chew 500 mg by mouth 3 (three) times daily as needed for indigestion or heartburn.   Cholecalciferol (VITAMIN D) 50 MCG (2000 UT) tablet Take 1 tablet (2,000 Units total) by mouth daily.   diphenoxylate-atropine (LOMOTIL) 2.5-0.025 MG tablet Take 2 tablets by mouth 4 (four) times daily.   ferrous sulfate 325 (65 FE) MG tablet Take 1 tablet (325 mg total) by mouth daily with breakfast.   finasteride (PROSCAR) 5 MG tablet Take 5 mg by mouth daily.   gabapentin (NEURONTIN) 300 MG capsule TAKE 1 CAPSULE(300 MG) BY MOUTH THREE TIMES DAILY   HYDROcodone-acetaminophen (NORCO/VICODIN) 5-325 MG tablet Take 1 tablet by mouth every 6 (six) hours as needed for severe pain.   hyoscyamine (LEVSIN SL) 0.125 MG SL tablet Place 0.125 mg under the tongue every 6 (six) hours as needed for cramping.   magnesium oxide (MAG-OX) 400 MG tablet Take 1 tablet (400 mg total) by mouth 2 (two) times daily.   melatonin 3 MG TABS tablet Take 3 mg by mouth at bedtime.   Multiple Vitamin (MULTIVITAMIN WITH MINERALS) TABS tablet Take 1 tablet by mouth daily.   ondansetron (ZOFRAN-ODT) 4 MG disintegrating tablet Dissolve 1 tablet (4 mg total) by mouth every 6 (six) hours as needed for nausea or vomiting.   pantoprazole (PROTONIX) 40 MG tablet Take 40 mg by mouth daily.   Potassium Chloride ER 20 MEQ TBCR TAKE 1 TABLET(20  MEQ) BY MOUTH TWICE DAILY   simethicone (MYLICON) 80 MG chewable tablet Chew 80 mg by mouth 4 (four) times daily -  with meals and at bedtime.   thiamine (VITAMIN B1) 100 MG tablet  Take 1 tablet (100 mg total) by mouth daily.   traZODone (DESYREL) 100 MG tablet Take 200 mg by mouth at bedtime.   Upadacitinib ER (RINVOQ) 15 MG TB24 Take 15 mg by mouth daily.   valACYclovir (VALTREX) 1000 MG tablet Take one tablet 3 times a day for 7 days as needed for shingles flares   [DISCONTINUED] Upadacitinib ER (RINVOQ) 45 MG TB24 Take 1 tablet (45 mg total) by mouth daily. (Patient taking differently: Take 15 mg by mouth daily.)   Past Medical History:  Diagnosis Date   Acute prostatitis 07/24/2007   Qualifier: Diagnosis of  By: Clent Ridges MD, Jeannett Senior A    Acute respiratory failure with hypoxia (HCC)    Allergy    mild   Arthritis    osteoarthritis   Aspiration pneumonia (HCC) 05/24/2022   Asthma    Blood transfusion without reported diagnosis    BPH (benign prostatic hypertrophy) with urinary obstruction    Crohn's ileitis (HCC) suspected 05/03/2017   Dilated aortic root (HCC)    noted on echo 08/2012   Diverticulitis of colon    Drug-induced acute pancreatitis without infection or necrosis - from 6 MP 09/24/2017   EPIDIDYMITIS 02/15/2010   Qualifier: Diagnosis of  By: Clent Ridges MD, Tera Mater    GERD (gastroesophageal reflux disease)    H/O: GI bleed    Hemorrhoids    Hepatitis 1975   unknown type    HERPES SIMPLEX INFECTION 10/14/2007   Qualifier: Diagnosis of  By: Clent Ridges MD, Jeannett Senior A    Hiatal hernia    Ileus following gastrointestinal surgery (HCC) 12/26/2011   Long term (current) use of systemic steroids 06/18/2018   Psoriasis    sees Dr. Karene Fry at [facility].   Rectal fissure    Recurrent ventral incisional hernia 05/10/2012   Shingles    Short bowel syndrome    SVT (supraventricular tachycardia)    Ulcer 08/21/2013   ileal   Past Surgical History:  Procedure Laterality Date    BOWEL RESECTION  12/19/2011   Procedure: SMALL BOWEL RESECTION;  Surgeon: Mariella Saa, MD;  Location: [facility];  Service: General;  Laterality: N/A;  with anastamosis and insertion mesh   BRONCHOSCOPY     COLON SURGERY  01/2004   x 2   COLONOSCOPY W/ BIOPSIES  04/26/2017   per Dr. Leone Payor, no polyps, benign inflammation, repeat in 5 yrs    CYSTOSCOPY     ESOPHAGOGASTRODUODENOSCOPY     HEMICOLECTOMY     left side, at [facility], diverticulitis   HEMORRHOID BANDING     HERNIA REPAIR     [phone] incisional hernia   ILEOSTOMY     ILEOSTOMY CLOSURE     INSERTION OF MESH  07/31/2012   Procedure: INSERTION OF MESH;  Surgeon: Mariella Saa, MD;  Location: [facility];  Service: General;;   LAPAROTOMY  12/19/2011   Procedure: EXPLORATORY LAPAROTOMY;  Surgeon: Mariella Saa, MD;  Location: [facility];  Service: General;  Laterality: N/A;   PACEMAKER IMPLANT N/A 12/01/2020   Procedure: PACEMAKER IMPLANT;  Surgeon: Regan Lemming, MD;  Location: [facility]AS[facility];  Service: Cardiovascular;  Laterality: N/A;   PACEMAKER INSERTION Left    TONSILLECTOMY     UPPER GASTROINTESTINAL ENDOSCOPY     VASECTOMY     VENTRAL HERNIA REPAIR  07/31/2012   Procedure: HERNIA REPAIR VENTRAL ADULT;  Surgeon: Mariella Saa, MD;  Location: [facility];  Service: General;  Laterality: N/A;   Social  History   Social History Narrative   He is married with 2 sons 1 son is an Acupuncturist and the 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   Review of Systems See HPI  Objective:    Physical Exam @BP  116/76 (BP Location: Left Arm, Patient Position: Sitting, Cuff Size: Normal)   Pulse 62   Ht 5\' 10"  (1.778 m)   Wt 157 lb (71.2 kg)   SpO2 96%   BMI 22.53 kg/m @  General:  NAD Eyes:   anicteric Lungs:  clear Heart::  S1S2 no rubs, murmurs or gallops Abdomen:  soft and mildly tender lower quadrants, BS+ multiple scars, a small bulge where ileostomy was - but no hernias and neg Carnett's Ext:   no edema, cyanosis or clubbing Declined rectal today    Data Reviewed:  See HPI

## 2023-04-13 NOTE — Patient Instructions (Addendum)
   Dietitian to consider:  Lowanda Foster - [employer]  Your provider has requested that you go to the basement level for lab work before leaving today. Press "B" on the elevator. The lab is located at the first door on the left as you exit the elevator. ONLY PICK UP THE STOOL CONTAINER TODAY, COME BACK FASTING FOR THE LABS PLEASE.  We have placed a PT referral , they will contact you about setting up an appointment.   I appreciate the opportunity to care for you. Stan Head, MD, [facility]

## 2023-04-15 ENCOUNTER — Other Ambulatory Visit: Payer: Self-pay | Admitting: Family Medicine

## 2023-04-15 DIAGNOSIS — B029 Zoster without complications: Secondary | ICD-10-CM

## 2023-04-18 ENCOUNTER — Ambulatory Visit (INDEPENDENT_AMBULATORY_CARE_PROVIDER_SITE_OTHER): Payer: Medicare Other | Admitting: Family Medicine

## 2023-04-18 ENCOUNTER — Encounter: Payer: Self-pay | Admitting: Family Medicine

## 2023-04-18 VITALS — BP 108/68 | HR 56 | Temp 97.9°F | Wt 155.0 lb

## 2023-04-18 DIAGNOSIS — N390 Urinary tract infection, site not specified: Secondary | ICD-10-CM

## 2023-04-18 LAB — POC URINALSYSI DIPSTICK (AUTOMATED)
Bilirubin, UA: NEGATIVE
Blood, UA: NEGATIVE
Glucose, UA: NEGATIVE
Ketones, UA: NEGATIVE
Leukocytes, UA: NEGATIVE
Nitrite, UA: NEGATIVE
Protein, UA: POSITIVE — AB
Spec Grav, UA: 1.015 (ref 1.010–1.025)
Urobilinogen, UA: 1 U/dL
pH, UA: 7 (ref 5.0–8.0)

## 2023-04-18 MED ORDER — SULFAMETHOXAZOLE-TRIMETHOPRIM 800-160 MG PO TABS: 1.0000 | ORAL_TABLET | Freq: Two times a day (BID) | ORAL | 0 refills | Status: DC

## 2023-04-18 NOTE — Progress Notes (Signed)
Subjective:    Patient ID: [name], male    DOB: 1950/11/02, 71 y.o.   MRN: [id number]  HPI Here for several days of urinary urgency and burning. No fever. We treated him in August for a UTI with Cipro, but the culture at tat time showed no growth.    Review of Systems  Constitutional: Negative.   Respiratory: Negative.    Cardiovascular: Negative.   Genitourinary:  Positive for dysuria, frequency and urgency. Negative for flank pain and hematuria.       Objective:   Physical Exam Constitutional:      Appearance: Normal appearance. He is not ill-appearing.  Cardiovascular:     Rate and Rhythm: Normal rate and regular rhythm.     Pulses: Normal pulses.     Heart sounds: Normal heart sounds.  Pulmonary:     Effort: Pulmonary effort is normal.     Breath sounds: Normal breath sounds.  Abdominal:     General: Abdomen is flat. Bowel sounds are normal. There is no distension.     Palpations: Abdomen is soft.     Tenderness: There is no abdominal tenderness. There is no right CVA tenderness or rebound.     Hernia: No hernia is present.  Neurological:     Mental Status: He is alert.           Assessment & Plan:  UTI, treat with 10 days of Bactrim DS. Culture the sample. He is scheduled to see his urologist, Dr. Alvester Morin, on 05-17-23.  Gershon Crane, MD

## 2023-04-18 NOTE — Addendum Note (Signed)
Addended by: Carola Rhine on: 04/18/2023 03:46 PM   Modules accepted: Orders

## 2023-04-19 LAB — URINE CULTURE
MICRO NUMBER:: 15542424
Result:: NO GROWTH
SPECIMEN QUALITY:: ADEQUATE

## 2023-04-25 ENCOUNTER — Encounter: Payer: Self-pay | Admitting: Family Medicine

## 2023-04-25 ENCOUNTER — Ambulatory Visit (INDEPENDENT_AMBULATORY_CARE_PROVIDER_SITE_OTHER): Payer: Medicare Other | Admitting: Family Medicine

## 2023-04-25 VITALS — BP 98/62 | HR 63 | Temp 97.7°F | Wt 154.2 lb

## 2023-04-25 DIAGNOSIS — R102 Pelvic and perineal pain: Secondary | ICD-10-CM | POA: Diagnosis not present

## 2023-04-25 DIAGNOSIS — R300 Dysuria: Secondary | ICD-10-CM | POA: Diagnosis not present

## 2023-04-25 LAB — POC URINALSYSI DIPSTICK (AUTOMATED)
Bilirubin, UA: NEGATIVE
Blood, UA: NEGATIVE
Clarity, UA: NEGATIVE
Glucose, UA: NEGATIVE
Ketones, UA: NEGATIVE
Nitrite, UA: NEGATIVE
Protein, UA: POSITIVE — AB
Spec Grav, UA: 1.015 (ref 1.010–1.025)
Urobilinogen, UA: 1 U/dL
pH, UA: 6 (ref 5.0–8.0)

## 2023-04-25 MED ORDER — PHENAZOPYRIDINE HCL 200 MG PO TABS: 200.0000 mg | ORAL_TABLET | Freq: Three times a day (TID) | ORAL | 2 refills | Status: DC | PRN

## 2023-04-25 MED ORDER — HYDROCODONE-ACETAMINOPHEN 5-325 MG PO TABS: 1.0000 | ORAL_TABLET | ORAL | 0 refills | Status: DC | PRN

## 2023-04-25 NOTE — Progress Notes (Signed)
Subjective:    Patient ID: [name], male    DOB: October 04, 1950, 71 y.o.   MRN: [id number]  HPI Here to follow up on burning with urination and pelvic pain. We saw him on 11-29-22 for this and gave him Cipro, we saw him again on 03-02-23 and we gave him Cipro, then we saw him again on 04-18-23 and we tried bactrim DS. Each time he felt better for awhile but then the symptoms returned. We cultured his urine on each of these three visits, and they all revealed no growth.    Review of Systems  Constitutional: Negative.   Respiratory: Negative.    Cardiovascular: Negative.   Gastrointestinal: Negative.   Genitourinary:  Positive for dysuria, frequency and urgency. Negative for flank pain, hematuria and testicular pain.       Objective:   Physical Exam Constitutional:      Appearance: Normal appearance. He is not ill-appearing.  Cardiovascular:     Rate and Rhythm: Normal rate and regular rhythm.     Pulses: Normal pulses.     Heart sounds: Normal heart sounds.  Pulmonary:     Effort: Pulmonary effort is normal.     Breath sounds: Normal breath sounds.  Abdominal:     Tenderness: There is no right CVA tenderness or left CVA tenderness.  Neurological:     Mental Status: He is alert.           Assessment & Plan:  He has recurring symptoms of dysuria and pelvic pain. Possible etiologies include chronic prostatitis and interstitial cystitis. We will let him try Pyridium 200 mg TID as needed. He is scheduled to see Dr. Alvester Morin on 05-17-23 for a urologic evaluation.  Gershon Crane, MD

## 2023-04-25 NOTE — Addendum Note (Signed)
Addended by: Carola Rhine on: 04/25/2023 03:44 PM   Modules accepted: Orders

## 2023-04-27 ENCOUNTER — Other Ambulatory Visit: Payer: Medicare Other

## 2023-04-27 ENCOUNTER — Other Ambulatory Visit: Payer: Self-pay | Admitting: Internal Medicine

## 2023-04-27 ENCOUNTER — Encounter: Payer: Self-pay | Admitting: Internal Medicine

## 2023-04-27 DIAGNOSIS — E43 Unspecified severe protein-calorie malnutrition: Secondary | ICD-10-CM

## 2023-04-27 DIAGNOSIS — K50018 Crohn's disease of small intestine with other complication: Secondary | ICD-10-CM

## 2023-04-27 DIAGNOSIS — K601 Chronic anal fissure: Secondary | ICD-10-CM

## 2023-04-27 DIAGNOSIS — E519 Thiamine deficiency, unspecified: Secondary | ICD-10-CM

## 2023-04-27 DIAGNOSIS — K90821 Short bowel syndrome with colon in continuity: Secondary | ICD-10-CM

## 2023-04-27 DIAGNOSIS — Z7960 Long term (current) use of unspecified immunomodulators and immunosuppressants: Secondary | ICD-10-CM

## 2023-04-30 ENCOUNTER — Other Ambulatory Visit (INDEPENDENT_AMBULATORY_CARE_PROVIDER_SITE_OTHER): Payer: Medicare Other

## 2023-04-30 DIAGNOSIS — K50018 Crohn's disease of small intestine with other complication: Secondary | ICD-10-CM

## 2023-04-30 DIAGNOSIS — K90821 Short bowel syndrome with colon in continuity: Secondary | ICD-10-CM

## 2023-04-30 DIAGNOSIS — Z7960 Long term (current) use of unspecified immunomodulators and immunosuppressants: Secondary | ICD-10-CM

## 2023-04-30 DIAGNOSIS — E519 Thiamine deficiency, unspecified: Secondary | ICD-10-CM | POA: Diagnosis not present

## 2023-04-30 LAB — MAGNESIUM: Magnesium: 1.5 mg/dL (ref 1.5–2.5)

## 2023-04-30 LAB — COMPREHENSIVE METABOLIC PANEL
ALT: 34 U/L (ref 0–53)
AST: 46 U/L — ABNORMAL HIGH (ref 0–37)
Albumin: 3.9 g/dL (ref 3.5–5.2)
Alkaline Phosphatase: 50 U/L (ref 39–117)
BUN: 14 mg/dL (ref 6–23)
CO2: 30 meq/L (ref 19–32)
Calcium: 8.9 mg/dL (ref 8.4–10.5)
Chloride: 103 meq/L (ref 96–112)
Creatinine, Ser: 1.14 mg/dL (ref 0.40–1.50)
GFR: 65.26 mL/min (ref >60.00)
Glucose, Bld: 85 mg/dL (ref 70–99)
Potassium: 3.6 meq/L (ref 3.5–5.1)
Sodium: 139 meq/L (ref 135–145)
Total Bilirubin: 0.6 mg/dL (ref 0.2–1.2)
Total Protein: 6.3 g/dL (ref 6.0–8.3)

## 2023-04-30 LAB — PHOSPHORUS: Phosphorus: 3.1 mg/dL (ref 2.3–4.6)

## 2023-04-30 LAB — LIPID PANEL
Cholesterol: 111 mg/dL (ref 0–200)
HDL: 31.8 mg/dL — ABNORMAL LOW (ref >39.00)
LDL Cholesterol: 20 mg/dL (ref 0–99)
NonHDL: 78.81
Total CHOL/HDL Ratio: 3
Triglycerides: 295 mg/dL — ABNORMAL HIGH (ref 0.0–149.0)
VLDL: 59 mg/dL — ABNORMAL HIGH (ref 0.0–40.0)

## 2023-05-02 LAB — CALPROTECTIN, FECAL: Calprotectin, Fecal: 59 ug/g (ref 0–120)

## 2023-05-04 LAB — VITAMIN B1: Vitamin B1 (Thiamine): 12 nmol/L (ref 8–30)

## 2023-05-04 LAB — PREALBUMIN: Prealbumin: 28 mg/dL (ref 21–43)

## 2023-05-18 ENCOUNTER — Other Ambulatory Visit: Payer: Self-pay

## 2023-05-18 ENCOUNTER — Ambulatory Visit: Payer: Medicare Other | Attending: Internal Medicine | Admitting: Physical Therapy

## 2023-05-18 ENCOUNTER — Encounter: Payer: Self-pay | Admitting: Physical Therapy

## 2023-05-18 DIAGNOSIS — M6281 Muscle weakness (generalized): Secondary | ICD-10-CM | POA: Diagnosis present

## 2023-05-18 DIAGNOSIS — R1084 Generalized abdominal pain: Secondary | ICD-10-CM | POA: Insufficient documentation

## 2023-05-18 DIAGNOSIS — R198 Other specified symptoms and signs involving the digestive system and abdomen: Secondary | ICD-10-CM | POA: Insufficient documentation

## 2023-05-18 DIAGNOSIS — R102 Pelvic and perineal pain: Secondary | ICD-10-CM | POA: Insufficient documentation

## 2023-05-18 DIAGNOSIS — R262 Difficulty in walking, not elsewhere classified: Secondary | ICD-10-CM | POA: Insufficient documentation

## 2023-05-18 DIAGNOSIS — R252 Cramp and spasm: Secondary | ICD-10-CM | POA: Insufficient documentation

## 2023-05-18 DIAGNOSIS — R293 Abnormal posture: Secondary | ICD-10-CM | POA: Insufficient documentation

## 2023-05-18 DIAGNOSIS — L905 Scar conditions and fibrosis of skin: Secondary | ICD-10-CM | POA: Insufficient documentation

## 2023-05-18 NOTE — Therapy (Signed)
OUTPATIENT PHYSICAL THERAPY MALE PELVIC EVALUATION   Patient Name: [name] MRN: [id number] DOB:1950-11-04, 72 y.o., male Today's Date: 05/18/2023  END OF SESSION:   Past Medical History:  Diagnosis Date   Acute prostatitis 07/24/2007   Qualifier: Diagnosis of  By: Clent Ridges MD, Jeannett Senior A    Acute respiratory failure with hypoxia (HCC)    Allergy    mild   Arthritis    osteoarthritis   Aspiration pneumonia (HCC) 05/24/2022   Asthma    Blood transfusion without reported diagnosis    BPH (benign prostatic hypertrophy) with urinary obstruction    Crohn's ileitis (HCC) suspected 05/03/2017   Dilated aortic root (HCC)    noted on echo 08/2012   Diverticulitis of colon    Drug-induced acute pancreatitis without infection or necrosis - from 6 MP 09/24/2017   EPIDIDYMITIS 02/15/2010   Qualifier: Diagnosis of  By: Clent Ridges MD, Tera Mater    GERD (gastroesophageal reflux disease)    H/O: GI bleed    Hemorrhoids    Hepatitis 1975   unknown type    HERPES SIMPLEX INFECTION 10/14/2007   Qualifier: Diagnosis of  By: Clent Ridges MD, Jeannett Senior A    Hiatal hernia    Ileus following gastrointestinal surgery (HCC) 12/26/2011   Long term (current) use of systemic steroids 06/18/2018   Psoriasis    sees Dr. Karene Fry at [facility].   Rectal fissure    Recurrent ventral incisional hernia 05/10/2012   Shingles    Short bowel syndrome    SVT (supraventricular tachycardia) (HCC)    Ulcer 08/21/2013   ileal   Past Surgical History:  Procedure Laterality Date   BOWEL RESECTION  12/19/2011   Procedure: SMALL BOWEL RESECTION;  Surgeon: Mariella Saa, MD;  Location: [facility];  Service: General;  Laterality: N/A;  with anastamosis and insertion mesh   BRONCHOSCOPY     COLON SURGERY  01/2004   x 2   COLONOSCOPY W/ BIOPSIES  04/26/2017   per Dr. Leone Payor, no polyps, benign inflammation, repeat in 5 yrs    CYSTOSCOPY     ESOPHAGOGASTRODUODENOSCOPY     HEMICOLECTOMY     left side, at [facility],  diverticulitis   HEMORRHOID BANDING     HERNIA REPAIR     [phone] incisional hernia   ILEOSTOMY     ILEOSTOMY CLOSURE     INSERTION OF MESH  07/31/2012   Procedure: INSERTION OF MESH;  Surgeon: Mariella Saa, MD;  Location: [facility];  Service: General;;   LAPAROTOMY  12/19/2011   Procedure: EXPLORATORY LAPAROTOMY;  Surgeon: Mariella Saa, MD;  Location: [facility];  Service: General;  Laterality: N/A;   PACEMAKER IMPLANT N/A 12/01/2020   Procedure: PACEMAKER IMPLANT;  Surgeon: Regan Lemming, MD;  Location: [facility];  Service: Cardiovascular;  Laterality: N/A;   PACEMAKER INSERTION Left    TONSILLECTOMY     UPPER GASTROINTESTINAL ENDOSCOPY     VASECTOMY     VENTRAL HERNIA REPAIR  07/31/2012   Procedure: HERNIA REPAIR VENTRAL ADULT;  Surgeon: Mariella Saa, MD;  Location: [facility];  Service: General;  Laterality: N/A;   Patient Active Problem List   Diagnosis Date Noted   Generalized weakness 05/24/2022   Drug overdose, intentional, initial encounter (HCC) 05/24/2022   Chronic pain syndrome 05/24/2022   Chronic anemia    Chronic prostatitis 05/01/2022   Bladder calculi 05/01/2022   Dysuria    Pelvic floor dysfunction 04/27/2022   Crohn's disease of small  intestine with other complication (HCC) 02/13/2022   Protein-calorie malnutrition, severe 01/05/2022   Pacemaker 01/03/2022   External hemorrhoid, thrombosed 05/27/2019   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   Long-term use of immunosuppressant medication  -Rinvoq 06/28/2017   Crohn's ileitis (HCC)  05/03/2017   Psoriasis 08/23/2016   Insomnia 04/22/2014   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/06/2012   SVT (supraventricular tachycardia) (HCC) 08/04/2012   Vitamin D deficiency 06/18/2012   Attention deficit hyperactivity disorder (ADHD) 05/28/2009   BPH (benign prostatic hyperplasia) 07/24/2007   Asthma 03/06/2007   GERD 03/06/2007   DIVERTICULITIS, HX OF 03/06/2007    PCP: [name], MD    REFERRING PROVIDER: Iva Boop, MD   REFERRING DIAG: R19.8 (ICD-10-CM) - Abdominal weakness   THERAPY DIAG:  No diagnosis found.  Rationale for Evaluation and Treatment: Rehabilitation  ONSET DATE: 2005  SUBJECTIVE:                                                                                                                                                                                           SUBJECTIVE STATEMENT: Abdominal wall weakness after 6 abdominal surgeries. 2006 hernia repair with mesh. He reports pelvic pain issues. Did pelvic floor therapy in the past and reduce his pelvic pain. Patient is going to see urologist on Monday, Dr. Alvester Morin.  Fluid intake: Yes: does not drink caffeine, water     PAIN:  Are you having pain? Yes: NPRS scale: 3/10 Pain location: abdomen Pain description: constant Aggravating factors: stress Relieving factors: treatment  PAIN:  Are you having pain? Yes NPRS scale: 4-8/10 Pain location: between anus and scrotum Pain type: dull and pressure,sharp Pain description: constant more at night  Aggravating factors: sitting, urinating, bowel movement,  Relieving factors: walking, moving  PRECAUTIONS: ICD/Pacemaker  RED FLAGS: None   WEIGHT BEARING RESTRICTIONS: No  FALLS:  Has patient fallen in last 6 months? Yes. Number of falls water main cover was not put back properly and tripped on it and recovered so not due to balance  LIVING ENVIRONMENT: Lives with: lives with their spouse   OCCUPATION: retired  PLOF: Independent  PATIENT GOALS: build endurance back, strength of abdominal wall, reduction of pelvic pain  PERTINENT HISTORY:  Chron's disease of ilium; short bowel syndrome; chronic posterior anal fissure;, small bowel anastomosis's resection x 2, mesh removal x2; ileostomy closure  BOWEL MOVEMENT: Pain with bowel movement: No Type of bowel movement:Type (Bristol Stool Scale) Type 7, Frequency 5, Strain No, and  Splinting no  goes multiple times due to short bowel syndrome Fully empty rectum: Yes:   Leakage: Yes: sometimes Pads: Yes: diaper  Fiber supplement: No he is not suppose to  URINATION: Pain with urination: Yes Fully empty bladder: Yes:   Stream: Strong Urgency: Yes: sometimes Frequency: every 2 hours Leakage:  none   INTERCOURSE:Not active     OBJECTIVE:  Note: Objective measures were completed at Evaluation unless otherwise noted.   COGNITION: Overall cognitive status: Within functional limits for tasks assessed       FUNCTIONAL TESTS:  6 minute walk test: exertion 5 for 1601 feet  GAIT: Distance walked: 6 minutes walked 1601 feet with exertion of 5 moderate Assistive device utilized: None Level of assistance: Complete Independence Comments: walks with decreased trunk extension, decreased hip extension  POSTURE: rounded shoulders, forward head, decreased lumbar lordosis, increased thoracic kyphosis, posterior pelvic tilt, and flexed trunk   PELVIC ALIGNMENT: ASIS is equal  LUMBARAROM/PROM:  A/PROM A/PROM  eval  Flexion full  Extension 100% limitation  Right lateral flexion Decreased by 25%  Left lateral flexion Decreased by 25%  Right rotation Decreased by 25%  Left rotation Decreased by 25%   (Blank rows = not tested)    LOWER EXTREMITY MMT:  MMT Right eval Left eval  Hip extension 3/5 3/5  Hip abduction 4/5 3+/5   PALPATION:   General  rib cage is restricted, multiple abdominal scars that are restricted. Unable to tighten his stomach and will lift up his rib cage instead.                 External Perineal Exam ischiocavernosus, bulbocavernosus,                              Internal Pelvic Floor tenderness located in bilateral levator ani, obturator internist, coccygeus  Patient confirms identification and approves PT to assess internal pelvic floor and treatment Yes  PELVIC MMT:   MMT eval  Internal Anal Sphincter 1/5  External Anal  Sphincter 1/5  Puborectalis 2/5  (Blank rows = not tested)        TONE: increased   TODAY'S TREATMENT:                                                                                                                              DATE: 05/18/23  EVAL see below   PATIENT EDUCATION:  Education details: lay on his stomach and add in prop on elbows to elongate his anterior trunk.  Person educated: Patient Education method: Medical illustrator Education comprehension: verbalized understanding and returned demonstration  HOME EXERCISE PROGRAM: See above.   ASSESSMENT:  CLINICAL IMPRESSION: Patient is a 72 y.o. male who was seen today for physical therapy evaluation and treatment for abdominal weakness. Patient has had 6 abdominal surgeries since 2005. Patient has Chron's disease with ileostomy closure and bowel  resections. He stands with increased thoracic kyphosis, reduced lumbar lordosis, forward head, rounded shoulders, and posteriorly rotated pelvis. Patient did the 6 minute walk test for 1601  feet with exertion of 5, moderate. Patient reports abdominal pain at level 3/10. He has multiple restricted scars on the abdomen. Decreased mobility of the rib cage. Patient does not have lumbar extension and all other lumbar movements are decreased by 25%. Patient has difficulty with contracting his abdomen and will lift his rib cage. Patient has weakness in his hips.  He has tenderness in the abdomen, levator ani, obturator internist, coccygeus, bulbocavernosus, and ischiocavernosus. External and internal anal sphincter strength is 1/5 and puborectalis is 2/5. Patient reports fecal leakage and will wear a diaper due to this. His stool is Type 7 and going 5 times per day due to short bowel syndrome. Patient will benefit from skilled therapy to improve overall endurance, posture, strength and reduce his pain.   OBJECTIVE IMPAIRMENTS: Abnormal gait, decreased activity tolerance, decreased  coordination, decreased endurance, difficulty walking, decreased ROM, decreased strength, increased fascial restrictions, increased muscle spasms, impaired tone, postural dysfunction, and pain.   ACTIVITY LIMITATIONS: sitting, standing, continence, toileting, and locomotion level  PARTICIPATION LIMITATIONS: shopping and community activity  PERSONAL FACTORS: Fitness, Time since onset of injury/illness/exacerbation, and 3+ comorbidities: Chron's disease of ilium; short bowel syndrome; chronic posterior anal fissure;, small bowel anastomosis's resection x 2, mesh removal x2; ileostomy closure  are also affecting patient's functional outcome.   REHAB POTENTIAL: Excellent  CLINICAL DECISION MAKING: Evolving/moderate complexity  EVALUATION COMPLEXITY: Moderate   GOALS: Goals reviewed with patient? Yes  SHORT TERM GOALS: Target date: 06/15/23  Patient independent with initial HEP for strengthening and flexibility.  Baseline: Goal status: INITIAL  2.  Patient understands how to perform scar massage to lengthen the tissue.  Baseline:  Goal status: INITIAL  3.  Patient reports his abdominal pain decreased >/= 25 % due to reduction of restrictions.  Baseline:  Goal status: INITIAL  4.  Patient is able to contract his abdominals with not lifting his rib cage.  Baseline:  Goal status: INITIAL  5.  Patient has increased lumbar extension by 25% due to increased lumbar ROM and length of his abdominals.  Baseline:  Goal status: INITIAL  6.  Patient able to do the 6 minute walk test with exertion </= 2/10.  Baseline:  Goal status: INITIAL  LONG TERM GOALS: Target date: 07/13/23  Patient independent with advanced HEP for strength, flexibility, and endurance.  Baseline:  Goal status: INITIAL  2.  Patient pelvic floor strength >/= 3/5 holding for 40 seconds so his fecal leakage decreased >/= 75%.  Baseline:  Goal status: INITIAL  3.  Patient is able to walk >/= 1729 feet in 6 minutes  with 0-1 exertion due to increased strength and rib mobility.  Baseline:  Goal status: INITIAL  4.  Patient is able to stand upright due to increased length of the anterior trunk and lumbar extension improve by 50%.  Baseline:  Goal status: INITIAL  5.  Patient has reduction of pelvic pain decreased >/= 50% due to improve abdominal strength and elongation.  Baseline:  Goal status: INITIAL   PLAN:  PT FREQUENCY: 2x/week  PT DURATION: 8 weeks  PLANNED INTERVENTIONS: 97110-Therapeutic exercises, 97530- Therapeutic activity, 97112- Neuromuscular re-education, 97140- Manual therapy, [id number]- Gait training, 30865- Ultrasound, Patient/Family education, Balance training, Dry Needling, Joint manipulation, Spinal mobilization, Scar mobilization, Cryotherapy, Moist heat, and Biofeedback  PLAN FOR NEXT SESSION: manual work to abdomen, dry needling to scars, kinesiotape to scars for mobility, endurance training, hip and back extensor strength, nustep,  Pelvic floor: manual work to pelvic floor, abdominal work,  If MD signs the initial summary that means he agrees for physical therapist to work on the pelvic floor.    Eulis Foster, PT 05/18/23 12:18 PM

## 2023-05-22 ENCOUNTER — Encounter: Payer: Self-pay | Admitting: Internal Medicine

## 2023-05-23 ENCOUNTER — Other Ambulatory Visit: Payer: Self-pay

## 2023-05-23 ENCOUNTER — Telehealth: Payer: Self-pay | Admitting: Family Medicine

## 2023-05-23 ENCOUNTER — Ambulatory Visit: Payer: Medicare Other

## 2023-05-23 DIAGNOSIS — L905 Scar conditions and fibrosis of skin: Secondary | ICD-10-CM

## 2023-05-23 DIAGNOSIS — R102 Pelvic and perineal pain: Secondary | ICD-10-CM

## 2023-05-23 DIAGNOSIS — M6281 Muscle weakness (generalized): Secondary | ICD-10-CM

## 2023-05-23 DIAGNOSIS — R1031 Right lower quadrant pain: Secondary | ICD-10-CM

## 2023-05-23 DIAGNOSIS — R1084 Generalized abdominal pain: Secondary | ICD-10-CM

## 2023-05-23 DIAGNOSIS — R252 Cramp and spasm: Secondary | ICD-10-CM

## 2023-05-23 DIAGNOSIS — K50919 Crohn's disease, unspecified, with unspecified complications: Secondary | ICD-10-CM

## 2023-05-23 DIAGNOSIS — R262 Difficulty in walking, not elsewhere classified: Secondary | ICD-10-CM

## 2023-05-23 MED ORDER — HYDROCODONE-ACETAMINOPHEN 5-325 MG PO TABS: 1.0000 | ORAL_TABLET | ORAL | 0 refills | Status: DC | PRN

## 2023-05-23 NOTE — Telephone Encounter (Signed)
Pt LOV was 04/25/23 Last refill was done on 04/25/23 Please advise

## 2023-05-23 NOTE — Telephone Encounter (Signed)
Prescription Request  05/23/2023  LOV: 04/25/2023  What is the name of the medication or equipment? Hydrocodone-acetaminophen.   Have you contacted your pharmacy to request a refill? No   Which pharmacy would you like this sent to? [id number] - [place], [place] - [address] [place] [address] Phone: [phone] Fax: [phone]   Patient notified that their request is being sent to the clinical staff for review and that they should receive a response within 2 business days.   Please advise at Mobile [phone] (mobile)

## 2023-05-23 NOTE — Therapy (Signed)
OUTPATIENT PHYSICAL THERAPY MALE PELVIC EVALUATION   Patient Name: [name] MRN: [id number] DOB:1951-01-01, 72 y.o., male Today's Date: 05/23/2023  END OF SESSION:  PT End of Session - 05/23/23 1023     Visit Number 2    Date for PT Re-Evaluation 07/13/23    Authorization Type medicare    Authorization - Number of Visits 10    PT Start Time 1020    PT Stop Time 1100    PT Time Calculation (min) 40 min    Activity Tolerance Patient tolerated treatment well    Behavior During Therapy [facility] for tasks assessed/performed             Past Medical History:  Diagnosis Date   Acute prostatitis 07/24/2007   Qualifier: Diagnosis of  By: Clent Ridges MD, Jeannett Senior A    Acute respiratory failure with hypoxia (HCC)    Allergy    mild   Arthritis    osteoarthritis   Aspiration pneumonia (HCC) 05/24/2022   Asthma    Blood transfusion without reported diagnosis    BPH (benign prostatic hypertrophy) with urinary obstruction    Crohn's ileitis (HCC) suspected 05/03/2017   Dilated aortic root (HCC)    noted on echo 08/2012   Diverticulitis of colon    Drug-induced acute pancreatitis without infection or necrosis - from 6 MP 09/24/2017   EPIDIDYMITIS 02/15/2010   Qualifier: Diagnosis of  By: Clent Ridges MD, Tera Mater    GERD (gastroesophageal reflux disease)    H/O: GI bleed    Hemorrhoids    Hepatitis 1975   unknown type    HERPES SIMPLEX INFECTION 10/14/2007   Qualifier: Diagnosis of  By: Clent Ridges MD, Jeannett Senior A    Hiatal hernia    Ileus following gastrointestinal surgery (HCC) 12/26/2011   Long term (current) use of systemic steroids 06/18/2018   Psoriasis    sees Dr. Karene Fry at [facility].   Rectal fissure    Recurrent ventral incisional hernia 05/10/2012   Shingles    Short bowel syndrome    SVT (supraventricular tachycardia) (HCC)    Ulcer 08/21/2013   ileal   Past Surgical History:  Procedure Laterality Date   BOWEL RESECTION  12/19/2011   Procedure: SMALL BOWEL RESECTION;   Surgeon: Mariella Saa, MD;  Location: [facility];  Service: General;  Laterality: N/A;  with anastamosis and insertion mesh   BRONCHOSCOPY     COLON SURGERY  01/2004   x 2   COLONOSCOPY W/ BIOPSIES  04/26/2017   per Dr. Leone Payor, no polyps, benign inflammation, repeat in 5 yrs    CYSTOSCOPY     ESOPHAGOGASTRODUODENOSCOPY     HEMICOLECTOMY     left side, at [facility], diverticulitis   HEMORRHOID BANDING     HERNIA REPAIR     [phone] incisional hernia   ILEOSTOMY     ILEOSTOMY CLOSURE     INSERTION OF MESH  07/31/2012   Procedure: INSERTION OF MESH;  Surgeon: Mariella Saa, MD;  Location: [facility];  Service: General;;   LAPAROTOMY  12/19/2011   Procedure: EXPLORATORY LAPAROTOMY;  Surgeon: Mariella Saa, MD;  Location: [facility];  Service: General;  Laterality: N/A;   PACEMAKER IMPLANT N/A 12/01/2020   Procedure: PACEMAKER IMPLANT;  Surgeon: Regan Lemming, MD;  Location: [facility];  Service: Cardiovascular;  Laterality: N/A;   PACEMAKER INSERTION Left    TONSILLECTOMY     UPPER GASTROINTESTINAL ENDOSCOPY     VASECTOMY  VENTRAL HERNIA REPAIR  07/31/2012   Procedure: HERNIA REPAIR VENTRAL ADULT;  Surgeon: Mariella Saa, MD;  Location: [facility];  Service: General;  Laterality: N/A;   Patient Active Problem List   Diagnosis Date Noted   Generalized weakness 05/24/2022   Drug overdose, intentional, initial encounter (HCC) 05/24/2022   Chronic pain syndrome 05/24/2022   Chronic anemia    Chronic prostatitis 05/01/2022   Bladder calculi 05/01/2022   Dysuria    Pelvic floor dysfunction 04/27/2022   Crohn's disease of small intestine with other complication (HCC) 02/13/2022   Protein-calorie malnutrition, severe 01/05/2022   Pacemaker 01/03/2022   External hemorrhoid, thrombosed 05/27/2019   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   Long-term use of immunosuppressant medication  -Rinvoq 06/28/2017   Crohn's ileitis (HCC)  05/03/2017   Psoriasis  08/23/2016   Insomnia 04/22/2014   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/06/2012   SVT (supraventricular tachycardia) (HCC) 08/04/2012   Vitamin D deficiency 06/18/2012   Attention deficit hyperactivity disorder (ADHD) 05/28/2009   BPH (benign prostatic hyperplasia) 07/24/2007   Asthma 03/06/2007   GERD 03/06/2007   DIVERTICULITIS, HX OF 03/06/2007    PCP: [name], MD   REFERRING PROVIDER: Iva Boop, MD   REFERRING DIAG: R19.8 (ICD-10-CM) - Abdominal weakness   THERAPY DIAG:  Pelvic pain  Muscle weakness (generalized)  Generalized abdominal pain  Scar adherent  Cramp and spasm  Difficulty in walking, not elsewhere classified  Rationale for Evaluation and Treatment: Rehabilitation  ONSET DATE: 2005  SUBJECTIVE:                                                                                                                                                                                           SUBJECTIVE STATEMENT: Patient states he is "not good" today.  Pain 6/10.  He states this pain is in his abdomen and pelvic floor.  Fluid intake: Yes: does not drink caffeine, water     PAIN:  Are you having pain? Yes: NPRS scale: 3/10 Pain location: abdomen Pain description: constant Aggravating factors: stress Relieving factors: treatment  PAIN:  Are you having pain? Yes NPRS scale: 4-8/10 Pain location: between anus and scrotum Pain type: dull and pressure,sharp Pain description: constant more at night  Aggravating factors: sitting, urinating, bowel movement,  Relieving factors: walking, moving  PRECAUTIONS: ICD/Pacemaker  RED FLAGS: None   WEIGHT BEARING RESTRICTIONS: No  FALLS:  Has patient fallen in last 6 months? Yes. Number of falls water main cover was not put back properly and tripped on it and recovered so not due to balance  LIVING ENVIRONMENT: Lives with: lives with their  spouse   OCCUPATION: retired  PLOF: Independent  PATIENT  GOALS: build endurance back, strength of abdominal wall, reduction of pelvic pain  PERTINENT HISTORY:  Chron's disease of ilium; short bowel syndrome; chronic posterior anal fissure;, small bowel anastomosis's resection x 2, mesh removal x2; ileostomy closure  BOWEL MOVEMENT: Pain with bowel movement: No Type of bowel movement:Type (Bristol Stool Scale) Type 7, Frequency 5, Strain No, and Splinting no  goes multiple times due to short bowel syndrome Fully empty rectum: Yes:   Leakage: Yes: sometimes Pads: Yes: diaper Fiber supplement: No he is not suppose to  URINATION: Pain with urination: Yes Fully empty bladder: Yes:   Stream: Strong Urgency: Yes: sometimes Frequency: every 2 hours Leakage:  none   INTERCOURSE:Not active     OBJECTIVE:  Note: Objective measures were completed at Evaluation unless otherwise noted.   COGNITION: Overall cognitive status: Within functional limits for tasks assessed       FUNCTIONAL TESTS:  6 minute walk test: exertion 5 for 1601 feet  GAIT: Distance walked: 6 minutes walked 1601 feet with exertion of 5 moderate Assistive device utilized: None Level of assistance: Complete Independence Comments: walks with decreased trunk extension, decreased hip extension  POSTURE: rounded shoulders, forward head, decreased lumbar lordosis, increased thoracic kyphosis, posterior pelvic tilt, and flexed trunk   PELVIC ALIGNMENT: ASIS is equal  LUMBARAROM/PROM:  A/PROM A/PROM  eval  Flexion full  Extension 100% limitation  Right lateral flexion Decreased by 25%  Left lateral flexion Decreased by 25%  Right rotation Decreased by 25%  Left rotation Decreased by 25%   (Blank rows = not tested)    LOWER EXTREMITY MMT:  MMT Right eval Left eval  Hip extension 3/5 3/5  Hip abduction 4/5 3+/5   PALPATION:   General  rib cage is restricted, multiple abdominal scars that are restricted. Unable to tighten his stomach and will lift up his rib  cage instead.                 External Perineal Exam ischiocavernosus, bulbocavernosus,                              Internal Pelvic Floor tenderness located in bilateral levator ani, obturator internist, coccygeus  Patient confirms identification and approves PT to assess internal pelvic floor and treatment Yes  PELVIC MMT:   MMT eval  Internal Anal Sphincter 1/5  External Anal Sphincter 1/5  Puborectalis 2/5  (Blank rows = not tested)        TONE: increased   TODAY'S TREATMENT:                                                                                                                              DATE: 05/23/23  Nustep x 5 min level 4 Seated upright posture with overhead lateral reach stretch x 10 Seated scapular retraction x 20 Seated green  tband rows with PT anchoring x 0 Seated bilateral ER x 20 Seated bilateral shoulder horizontal abduction x 20 with green tband Standing facing wall UE snow angels x 10 Supine abdominal massage x 10 min Fwd bent over table hip extension x 20 alternating Supine k tape technique : midline anchor with 3 strips horizontally with direction of pull to midline  DATE: 05/18/23  EVAL see below   PATIENT EDUCATION:  Education details: lay on his stomach and add in prop on elbows to elongate his anterior trunk.  Person educated: Patient Education method: Medical illustrator Education comprehension: verbalized understanding and returned demonstration  HOME EXERCISE PROGRAM: See above.   ASSESSMENT:  CLINICAL IMPRESSION: [name] was able to tolerate several exercises today without increased pain.  We initiated manual scar mobility and Ktape technique along with postural strengthening.  He had some discomfort with mobilizing midline incision but was motivated to continue working on that area.  He is working on his tolerance to lying prone.  He is compliant and well motivated.  He should continue to do well.  He would benefit from  continued skilled PT for postural strengthening and STM to the abdominal area.     He has tenderness in the abdomen, levator ani, obturator internist, coccygeus, bulbocavernosus, and ischiocavernosus. External and internal anal sphincter strength is 1/5 and puborectalis is 2/5. Patient reports fecal leakage and will wear a diaper due to this. His stool is Type 7 and going 5 times per day due to short bowel syndrome. Patient will benefit from skilled therapy to improve overall endurance, posture, strength and reduce his pain.   OBJECTIVE IMPAIRMENTS: Abnormal gait, decreased activity tolerance, decreased coordination, decreased endurance, difficulty walking, decreased ROM, decreased strength, increased fascial restrictions, increased muscle spasms, impaired tone, postural dysfunction, and pain.   ACTIVITY LIMITATIONS: sitting, standing, continence, toileting, and locomotion level  PARTICIPATION LIMITATIONS: shopping and community activity  PERSONAL FACTORS: Fitness, Time since onset of injury/illness/exacerbation, and 3+ comorbidities: Chron's disease of ilium; short bowel syndrome; chronic posterior anal fissure;, small bowel anastomosis's resection x 2, mesh removal x2; ileostomy closure  are also affecting patient's functional outcome.   REHAB POTENTIAL: Excellent  CLINICAL DECISION MAKING: Evolving/moderate complexity  EVALUATION COMPLEXITY: Moderate   GOALS: Goals reviewed with patient? Yes  SHORT TERM GOALS: Target date: 06/15/23  Patient independent with initial HEP for strengthening and flexibility.  Baseline: Goal status: INITIAL  2.  Patient understands how to perform scar massage to lengthen the tissue.  Baseline:  Goal status: INITIAL  3.  Patient reports his abdominal pain decreased >/= 25 % due to reduction of restrictions.  Baseline:  Goal status: INITIAL  4.  Patient is able to contract his abdominals with not lifting his rib cage.  Baseline:  Goal status:  INITIAL  5.  Patient has increased lumbar extension by 25% due to increased lumbar ROM and length of his abdominals.  Baseline:  Goal status: INITIAL  6.  Patient able to do the 6 minute walk test with exertion </= 2/10.  Baseline:  Goal status: INITIAL  LONG TERM GOALS: Target date: 07/13/23  Patient independent with advanced HEP for strength, flexibility, and endurance.  Baseline:  Goal status: INITIAL  2.  Patient pelvic floor strength >/= 3/5 holding for 40 seconds so his fecal leakage decreased >/= 75%.  Baseline:  Goal status: INITIAL  3.  Patient is able to walk >/= 1729 feet in 6 minutes with 0-1 exertion due to increased strength and rib mobility.  Baseline:  Goal status: INITIAL  4.  Patient is able to stand upright due to increased length of the anterior trunk and lumbar extension improve by 50%.  Baseline:  Goal status: INITIAL  5.  Patient has reduction of pelvic pain decreased >/= 50% due to improve abdominal strength and elongation.  Baseline:  Goal status: INITIAL   PLAN:  PT FREQUENCY: 2x/week  PT DURATION: 8 weeks  PLANNED INTERVENTIONS: 97110-Therapeutic exercises, 97530- Therapeutic activity, 97112- Neuromuscular re-education, 97140- Manual therapy, [phone]- Gait training, 60454- Ultrasound, Patient/Family education, Balance training, Dry Needling, Joint manipulation, Spinal mobilization, Scar mobilization, Cryotherapy, Moist heat, and Biofeedback  PLAN FOR NEXT SESSION: Continue manual work to abdomen, dry needling to scars, kinesiotape to scars for mobility, endurance training, hip and back extensor strength, nustep,  Pelvic floor: manual work to pelvic floor, abdominal work,  If MD signs the initial summary that means he agrees for physical therapist to work on the pelvic floor.    Victorino Dike B. Erline Siddoway, PT 05/23/23 11:22 AM [facility] [address] [place], [place] [address] Phone # [phone] Fax  [phone]

## 2023-05-23 NOTE — Telephone Encounter (Signed)
I sent in 30 more tabs

## 2023-05-24 NOTE — Telephone Encounter (Signed)
Pt notified via My Chart

## 2023-05-24 NOTE — Telephone Encounter (Signed)
Spoke with patient regarding MD recommendations & when/where to go for CT appointment. He stated last time he was a difficult IV stick & required an ultrasound, and asked that I make them aware. Called & spoke with Ethelene Browns with radiology scheduling, and he has made a note of what patient stated and that radiology would be aware.

## 2023-05-25 ENCOUNTER — Encounter: Payer: Self-pay | Admitting: Family Medicine

## 2023-05-25 ENCOUNTER — Ambulatory Visit (INDEPENDENT_AMBULATORY_CARE_PROVIDER_SITE_OTHER): Payer: Medicare Other | Admitting: Family Medicine

## 2023-05-25 VITALS — BP 98/68 | HR 58 | Temp 97.7°F | Wt 157.2 lb

## 2023-05-25 DIAGNOSIS — R102 Pelvic and perineal pain: Secondary | ICD-10-CM

## 2023-05-25 DIAGNOSIS — F1190 Opioid use, unspecified, uncomplicated: Secondary | ICD-10-CM

## 2023-05-25 MED ORDER — GABAPENTIN 100 MG PO CAPS: 100.0000 mg | ORAL_CAPSULE | Freq: Three times a day (TID) | ORAL | 5 refills | Status: DC

## 2023-05-25 MED ORDER — HYDROCODONE-ACETAMINOPHEN 5-325 MG PO TABS: 1.0000 | ORAL_TABLET | Freq: Every evening | ORAL | 0 refills | Status: DC | PRN

## 2023-05-25 NOTE — Progress Notes (Signed)
   Subjective:    Patient ID: [name], male    DOB: 1951-05-01, 72 y.o.   MRN: [id number]  HPI Here for his first pain management visit. He has chronic pelvic pain, and he has been seeing Urology for this. He is currently undergoing pelvic floor PT, and this seems to help. He has been taking a Norco tablet every night, and this helps him rest through the night. He has been taking Gabapentin 300 mg TID but this makes him very drowsy.    Review of Systems  Constitutional: Negative.        Objective:   Physical Exam Constitutional:      Appearance: Normal appearance.  Neurological:     Mental Status: He is alert.           Assessment & Plan:  Pain management. Indication for chronic opioid: pelvic pain Medication and dose: Norco 5-325 # pills per month: 30 Last UDS date: 05-25-23 Opioid Treatment Agreement signed (Y/N): 05-25-23 Opioid Treatment Agreement last reviewed with patient:  05-25-23 [facility] reviewed this encounter (include red flags):  yes  This was sent to his pharmacy. We wil also decrease the Gabapentin to 100 mg TID. Gershon Crane, MD

## 2023-05-25 NOTE — Addendum Note (Signed)
Addended by: Sumner Boast on: 05/25/2023 03:44 PM   Modules accepted: Orders

## 2023-05-27 LAB — DRUG MONITOR, PANEL 1, W/CONF, URINE
Amphetamine: NEGATIVE ng/mL (ref <250)
Amphetamines: NEGATIVE ng/mL (ref <500)
Barbiturates: NEGATIVE ng/mL (ref <300)
Benzodiazepines: NEGATIVE ng/mL (ref <100)
Cocaine Metabolite: NEGATIVE ng/mL (ref <150)
Codeine: NEGATIVE ng/mL (ref <50)
Creatinine: 196.1 mg/dL (ref >=20.0)
Hydrocodone: 898 ng/mL — ABNORMAL HIGH (ref <50)
Hydromorphone: NEGATIVE ng/mL (ref <50)
Marijuana Metabolite: NEGATIVE ng/mL (ref <20)
Methadone Metabolite: NEGATIVE ng/mL (ref <100)
Methamphetamine: NEGATIVE ng/mL (ref <250)
Morphine: NEGATIVE ng/mL (ref <50)
Norhydrocodone: 1689 ng/mL — ABNORMAL HIGH (ref <50)
Opiates: POSITIVE ng/mL — AB (ref <100)
Oxidant: NEGATIVE ug/mL (ref <200)
Oxycodone: NEGATIVE ng/mL (ref <100)
Phencyclidine: NEGATIVE ng/mL (ref <25)
pH: 5.8 (ref 4.5–9.0)

## 2023-05-27 LAB — DM TEMPLATE

## 2023-05-31 ENCOUNTER — Telehealth: Payer: Self-pay

## 2023-05-31 ENCOUNTER — Other Ambulatory Visit: Payer: Self-pay | Admitting: Internal Medicine

## 2023-05-31 ENCOUNTER — Ambulatory Visit (HOSPITAL_COMMUNITY)
Admission: RE | Admit: 2023-05-31 | Discharge: 2023-05-31 | Disposition: A | Discharge status: Home / Self Care | Payer: Medicare Other | Source: Ambulatory Visit | Attending: Internal Medicine | Admitting: Internal Medicine

## 2023-05-31 DIAGNOSIS — K50919 Crohn's disease, unspecified, with unspecified complications: Secondary | ICD-10-CM | POA: Diagnosis present

## 2023-05-31 DIAGNOSIS — R1031 Right lower quadrant pain: Secondary | ICD-10-CM

## 2023-05-31 MED ORDER — IOHEXOL 300 MG/ML  SOLN
30.0000 mL | Freq: Once | INTRAMUSCULAR | Status: AC | PRN
  Administered 2023-05-31: 30 mL via ORAL

## 2023-05-31 MED ORDER — IOHEXOL 300 MG/ML  SOLN: 100.0000 mL | Freq: Once | INTRAMUSCULAR | Status: DC | PRN

## 2023-05-31 NOTE — Telephone Encounter (Signed)
Spoke with [name] from radiology & he stated IV team attempted x 3 to start IV on patient for CT contrast and was not successful. He did receive PO contrast. Per Dr. Leone Payor, okay for patient to proceed w/o contrast. Radiology stated that if patient needs more scans in the future, then IV RN recommended pt have a port d/t poor access.

## 2023-05-31 NOTE — Telephone Encounter (Signed)
Please advise Sir, thank you. 

## 2023-06-12 ENCOUNTER — Encounter: Payer: Self-pay | Admitting: Family Medicine

## 2023-06-17 ENCOUNTER — Encounter: Payer: Self-pay | Admitting: Internal Medicine

## 2023-06-17 ENCOUNTER — Telehealth: Payer: Self-pay | Admitting: Internal Medicine

## 2023-06-17 NOTE — Telephone Encounter (Signed)
Left message that CT scan results are in and did not show any significant changes re: cause of pain  Will send My Chart message as well   He had messaged PCP that he has needed to increase pain medication for breakthrough pain  He may need a pain management specialist

## 2023-06-17 NOTE — Telephone Encounter (Signed)
I caught up with him and he reported that PT (orthopedic and pelvic) is helping with the pain overall.  He will continue that and he sees me on December 9.

## 2023-06-19 ENCOUNTER — Ambulatory Visit (INDEPENDENT_AMBULATORY_CARE_PROVIDER_SITE_OTHER): Payer: Medicare Other

## 2023-06-19 DIAGNOSIS — I495 Sick sinus syndrome: Secondary | ICD-10-CM

## 2023-06-20 ENCOUNTER — Ambulatory Visit: Payer: Medicare Other | Attending: Internal Medicine

## 2023-06-20 DIAGNOSIS — R252 Cramp and spasm: Secondary | ICD-10-CM | POA: Diagnosis present

## 2023-06-20 DIAGNOSIS — L905 Scar conditions and fibrosis of skin: Secondary | ICD-10-CM | POA: Diagnosis present

## 2023-06-20 DIAGNOSIS — R293 Abnormal posture: Secondary | ICD-10-CM | POA: Insufficient documentation

## 2023-06-20 DIAGNOSIS — R1084 Generalized abdominal pain: Secondary | ICD-10-CM | POA: Insufficient documentation

## 2023-06-20 DIAGNOSIS — R262 Difficulty in walking, not elsewhere classified: Secondary | ICD-10-CM | POA: Diagnosis present

## 2023-06-20 DIAGNOSIS — R102 Pelvic and perineal pain: Secondary | ICD-10-CM | POA: Diagnosis present

## 2023-06-20 DIAGNOSIS — M6281 Muscle weakness (generalized): Secondary | ICD-10-CM | POA: Diagnosis present

## 2023-06-20 LAB — CUP PACEART REMOTE DEVICE CHECK
Battery Remaining Longevity: 100 mo
Battery Remaining Percentage: 79 %
Battery Voltage: 3.01 V
Brady Statistic AP VP Percent: 8.1 %
Brady Statistic AP VS Percent: 63 %
Brady Statistic AS VP Percent: 1 %
Brady Statistic AS VS Percent: 28 %
Brady Statistic RA Percent Paced: 71 %
Brady Statistic RV Percent Paced: 8.6 %
Date Time Interrogation Session: 20241203020018
Implantable Lead Connection Status: 753985
Implantable Lead Connection Status: 753985
Implantable Lead Implant Date: 20220518
Implantable Lead Implant Date: 20220518
Implantable Lead Location: 753859
Implantable Lead Location: 753860
Implantable Lead Model: 1944
Implantable Pulse Generator Implant Date: 20220518
Lead Channel Impedance Value: 480 Ohm
Lead Channel Impedance Value: 510 Ohm
Lead Channel Pacing Threshold Amplitude: 0.375 V
Lead Channel Pacing Threshold Amplitude: 0.625 V
Lead Channel Pacing Threshold Pulse Width: 0.5 ms
Lead Channel Pacing Threshold Pulse Width: 0.5 ms
Lead Channel Sensing Intrinsic Amplitude: 5 mV
Lead Channel Sensing Intrinsic Amplitude: 7.7 mV
Lead Channel Setting Pacing Amplitude: 0.875
Lead Channel Setting Pacing Amplitude: 1.375
Lead Channel Setting Pacing Pulse Width: 0.5 ms
Lead Channel Setting Sensing Sensitivity: 2 mV
Pulse Gen Model: 2272
Pulse Gen Serial Number: 3925308

## 2023-06-20 NOTE — Therapy (Signed)
OUTPATIENT PHYSICAL THERAPY MALE PELVIC TREATMENT   Patient Name: [name] MRN: [id number] DOB:15-Jun-1951, 72 y.o., male Today's Date: 06/20/2023  END OF SESSION:  PT End of Session - 06/20/23 1403     Visit Number 3    Date for PT Re-Evaluation 07/13/23    Authorization Type medicare    Authorization - Number of Visits 10    PT Start Time 1403    PT Stop Time 1445    PT Time Calculation (min) 42 min    Activity Tolerance Patient tolerated treatment well    Behavior During Therapy [facility] for tasks assessed/performed             Past Medical History:  Diagnosis Date   Acute prostatitis 07/24/2007   Qualifier: Diagnosis of  By: Clent Ridges MD, Jeannett Senior A    Acute respiratory failure with hypoxia (HCC)    Allergy    mild   Arthritis    osteoarthritis   Aspiration pneumonia (HCC) 05/24/2022   Asthma    Blood transfusion without reported diagnosis    BPH (benign prostatic hypertrophy) with urinary obstruction    Crohn's ileitis (HCC) suspected 05/03/2017   Dilated aortic root (HCC)    noted on echo 08/2012   Diverticulitis of colon    Drug-induced acute pancreatitis without infection or necrosis - from 6 MP 09/24/2017   EPIDIDYMITIS 02/15/2010   Qualifier: Diagnosis of  By: Clent Ridges MD, Tera Mater    GERD (gastroesophageal reflux disease)    H/O: GI bleed    Hemorrhoids    Hepatitis 1975   unknown type    HERPES SIMPLEX INFECTION 10/14/2007   Qualifier: Diagnosis of  By: Clent Ridges MD, Jeannett Senior A    Hiatal hernia    Ileus following gastrointestinal surgery (HCC) 12/26/2011   Long term (current) use of systemic steroids 06/18/2018   Psoriasis    sees Dr. Karene Fry at [facility].   Rectal fissure    Recurrent ventral incisional hernia 05/10/2012   Shingles    Short bowel syndrome    SVT (supraventricular tachycardia) (HCC)    Ulcer 08/21/2013   ileal   Past Surgical History:  Procedure Laterality Date   BOWEL RESECTION  12/19/2011   Procedure: SMALL BOWEL RESECTION;   Surgeon: Mariella Saa, MD;  Location: [facility];  Service: General;  Laterality: N/A;  with anastamosis and insertion mesh   BRONCHOSCOPY     COLON SURGERY  01/2004   x 2   COLONOSCOPY W/ BIOPSIES  04/26/2017   per Dr. Leone Payor, no polyps, benign inflammation, repeat in 5 yrs    CYSTOSCOPY     ESOPHAGOGASTRODUODENOSCOPY     HEMICOLECTOMY     left side, at [facility], diverticulitis   HEMORRHOID BANDING     HERNIA REPAIR     [phone] incisional hernia   ILEOSTOMY     ILEOSTOMY CLOSURE     INSERTION OF MESH  07/31/2012   Procedure: INSERTION OF MESH;  Surgeon: Mariella Saa, MD;  Location: [facility];  Service: General;;   LAPAROTOMY  12/19/2011   Procedure: EXPLORATORY LAPAROTOMY;  Surgeon: Mariella Saa, MD;  Location: [facility];  Service: General;  Laterality: N/A;   PACEMAKER IMPLANT N/A 12/01/2020   Procedure: PACEMAKER IMPLANT;  Surgeon: Regan Lemming, MD;  Location: [facility];  Service: Cardiovascular;  Laterality: N/A;   PACEMAKER INSERTION Left    TONSILLECTOMY     UPPER GASTROINTESTINAL ENDOSCOPY     VASECTOMY  VENTRAL HERNIA REPAIR  07/31/2012   Procedure: HERNIA REPAIR VENTRAL ADULT;  Surgeon: Mariella Saa, MD;  Location: [facility];  Service: General;  Laterality: N/A;   Patient Active Problem List   Diagnosis Date Noted   Generalized weakness 05/24/2022   Drug overdose, intentional, initial encounter (HCC) 05/24/2022   Chronic pain syndrome 05/24/2022   Chronic anemia    Chronic prostatitis 05/01/2022   Bladder calculi 05/01/2022   Dysuria    Pelvic floor dysfunction 04/27/2022   Crohn's disease of small intestine with other complication (HCC) 02/13/2022   Protein-calorie malnutrition, severe 01/05/2022   Pacemaker 01/03/2022   External hemorrhoid, thrombosed 05/27/2019   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   Long-term use of immunosuppressant medication  -Rinvoq 06/28/2017   Crohn's ileitis (HCC)  05/03/2017   Psoriasis  08/23/2016   Insomnia 04/22/2014   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/06/2012   SVT (supraventricular tachycardia) (HCC) 08/04/2012   Vitamin D deficiency 06/18/2012   Attention deficit hyperactivity disorder (ADHD) 05/28/2009   BPH (benign prostatic hyperplasia) 07/24/2007   Asthma 03/06/2007   GERD 03/06/2007   DIVERTICULITIS, HX OF 03/06/2007    PCP: [name], MD   REFERRING PROVIDER: Iva Boop, MD   REFERRING DIAG: R19.8 (ICD-10-CM) - Abdominal weakness   THERAPY DIAG:  Pelvic pain  Muscle weakness (generalized)  Cramp and spasm  Generalized abdominal pain  Difficulty in walking, not elsewhere classified  Scar adherent  Rationale for Evaluation and Treatment: Rehabilitation  ONSET DATE: 2005  SUBJECTIVE:                                                                                                                                                                                           SUBJECTIVE STATEMENT: Patient states he is  Fluid intake: Yes: does not drink caffeine, water     PAIN:  Are you having pain? Yes: NPRS scale: 3/10 Pain location: abdomen Pain description: constant Aggravating factors: stress Relieving factors: treatment  PAIN:  Are you having pain? Yes NPRS scale: 4-8/10 Pain location: between anus and scrotum Pain type: dull and pressure,sharp Pain description: constant more at night  Aggravating factors: sitting, urinating, bowel movement,  Relieving factors: walking, moving  PRECAUTIONS: ICD/Pacemaker  RED FLAGS: None   WEIGHT BEARING RESTRICTIONS: No  FALLS:  Has patient fallen in last 6 months? Yes. Number of falls water main cover was not put back properly and tripped on it and recovered so not due to balance  LIVING ENVIRONMENT: Lives with: lives with their spouse   OCCUPATION: retired  PLOF: Independent  PATIENT GOALS: build endurance back, strength of abdominal wall,  reduction of pelvic  pain  PERTINENT HISTORY:  Chron's disease of ilium; short bowel syndrome; chronic posterior anal fissure;, small bowel anastomosis's resection x 2, mesh removal x2; ileostomy closure  BOWEL MOVEMENT: Pain with bowel movement: No Type of bowel movement:Type (Bristol Stool Scale) Type 7, Frequency 5, Strain No, and Splinting no  goes multiple times due to short bowel syndrome Fully empty rectum: Yes:   Leakage: Yes: sometimes Pads: Yes: diaper Fiber supplement: No he is not suppose to  URINATION: Pain with urination: Yes Fully empty bladder: Yes:   Stream: Strong Urgency: Yes: sometimes Frequency: every 2 hours Leakage:  none   INTERCOURSE:Not active     OBJECTIVE:  Note: Objective measures were completed at Evaluation unless otherwise noted.   COGNITION: Overall cognitive status: Within functional limits for tasks assessed       FUNCTIONAL TESTS:  6 minute walk test: exertion 5 for 1601 feet  GAIT: Distance walked: 6 minutes walked 1601 feet with exertion of 5 moderate Assistive device utilized: None Level of assistance: Complete Independence Comments: walks with decreased trunk extension, decreased hip extension  POSTURE: rounded shoulders, forward head, decreased lumbar lordosis, increased thoracic kyphosis, posterior pelvic tilt, and flexed trunk   PELVIC ALIGNMENT: ASIS is equal  LUMBARAROM/PROM:  A/PROM A/PROM  eval  Flexion full  Extension 100% limitation  Right lateral flexion Decreased by 25%  Left lateral flexion Decreased by 25%  Right rotation Decreased by 25%  Left rotation Decreased by 25%   (Blank rows = not tested)    LOWER EXTREMITY MMT:  MMT Right eval Left eval  Hip extension 3/5 3/5  Hip abduction 4/5 3+/5   PALPATION:   General  rib cage is restricted, multiple abdominal scars that are restricted. Unable to tighten his stomach and will lift up his rib cage instead.                 External Perineal Exam ischiocavernosus,  bulbocavernosus,                              Internal Pelvic Floor tenderness located in bilateral levator ani, obturator internist, coccygeus  Patient confirms identification and approves PT to assess internal pelvic floor and treatment Yes  PELVIC MMT:   MMT eval  Internal Anal Sphincter 1/5  External Anal Sphincter 1/5  Puborectalis 2/5  (Blank rows = not tested)        TONE: increased   TODAY'S TREATMENT:                                                                                                                              DATE: 06/20/23  Nustep x 5 min level 4 Seated upright posture with overhead lateral reach stretch x 10 Seated scapular retraction x 20 Seated green tband rows with PT anchoring  2 X 10 Seated bilateral ER x 20 with red band Seated  bilateral shoulder horizontal abduction x 20 with red tband Supine hip flexor stretch (SKTC with manual overpressure) Hooklying trunk rotation x 5 each side Removed Ktape: decided to hold on re-applying due to slight tenderness  DATE: 05/23/23  Nustep x 5 min level 4 Seated upright posture with overhead lateral reach stretch x 10 Seated scapular retraction x 20 Seated green tband rows with PT anchoring x 0 Seated bilateral ER x 20 Seated bilateral shoulder horizontal abduction x 20 with green tband Standing facing wall UE snow angels x 10 Supine abdominal massage x 10 min Fwd bent over table hip extension x 20 alternating Supine k tape technique : midline anchor with 3 strips horizontally with direction of pull to midline  DATE: 05/18/23  EVAL see below   PATIENT EDUCATION:  Education details: lay on his stomach and add in prop on elbows to elongate his anterior trunk.  Person educated: Patient Education method: Medical illustrator Education comprehension: verbalized understanding and returned demonstration  HOME EXERCISE PROGRAM: See above.   ASSESSMENT:  CLINICAL IMPRESSION: [name] did well  after last visit and is reporting feeling a little less soreness.  He continues to work on tolerance to certain positions.  We added some lower abdominal stretching today with no compliants of pain.   He is compliant and well motivated.  He should continue to do well.  He would benefit from continued skilled PT for postural strengthening and STM to the abdominal area.     He has tenderness in the abdomen, levator ani, obturator internist, coccygeus, bulbocavernosus, and ischiocavernosus. External and internal anal sphincter strength is 1/5 and puborectalis is 2/5. Patient reports fecal leakage and will wear a diaper due to this. His stool is Type 7 and going 5 times per day due to short bowel syndrome. Patient will benefit from skilled therapy to improve overall endurance, posture, strength and reduce his pain.   OBJECTIVE IMPAIRMENTS: Abnormal gait, decreased activity tolerance, decreased coordination, decreased endurance, difficulty walking, decreased ROM, decreased strength, increased fascial restrictions, increased muscle spasms, impaired tone, postural dysfunction, and pain.   ACTIVITY LIMITATIONS: sitting, standing, continence, toileting, and locomotion level  PARTICIPATION LIMITATIONS: shopping and community activity  PERSONAL FACTORS: Fitness, Time since onset of injury/illness/exacerbation, and 3+ comorbidities: Chron's disease of ilium; short bowel syndrome; chronic posterior anal fissure;, small bowel anastomosis's resection x 2, mesh removal x2; ileostomy closure  are also affecting patient's functional outcome.   REHAB POTENTIAL: Excellent  CLINICAL DECISION MAKING: Evolving/moderate complexity  EVALUATION COMPLEXITY: Moderate   GOALS: Goals reviewed with patient? Yes  SHORT TERM GOALS: Target date: 06/15/23  Patient independent with initial HEP for strengthening and flexibility.  Baseline: Goal status: INITIAL  2.  Patient understands how to perform scar massage to lengthen  the tissue.  Baseline:  Goal status: INITIAL  3.  Patient reports his abdominal pain decreased >/= 25 % due to reduction of restrictions.  Baseline:  Goal status: INITIAL  4.  Patient is able to contract his abdominals with not lifting his rib cage.  Baseline:  Goal status: INITIAL  5.  Patient has increased lumbar extension by 25% due to increased lumbar ROM and length of his abdominals.  Baseline:  Goal status: INITIAL  6.  Patient able to do the 6 minute walk test with exertion </= 2/10.  Baseline:  Goal status: INITIAL  LONG TERM GOALS: Target date: 07/13/23  Patient independent with advanced HEP for strength, flexibility, and endurance.  Baseline:  Goal status: INITIAL  2.  Patient pelvic floor strength >/= 3/5 holding for 40 seconds so his fecal leakage decreased >/= 75%.  Baseline:  Goal status: INITIAL  3.  Patient is able to walk >/= 1729 feet in 6 minutes with 0-1 exertion due to increased strength and rib mobility.  Baseline:  Goal status: INITIAL  4.  Patient is able to stand upright due to increased length of the anterior trunk and lumbar extension improve by 50%.  Baseline:  Goal status: INITIAL  5.  Patient has reduction of pelvic pain decreased >/= 50% due to improve abdominal strength and elongation.  Baseline:  Goal status: INITIAL   PLAN:  PT FREQUENCY: 2x/week  PT DURATION: 8 weeks  PLANNED INTERVENTIONS: 97110-Therapeutic exercises, 97530- Therapeutic activity, 97112- Neuromuscular re-education, 97140- Manual therapy, [phone]- Gait training, 60454- Ultrasound, Patient/Family education, Balance training, Dry Needling, Joint manipulation, Spinal mobilization, Scar mobilization, Cryotherapy, Moist heat, and Biofeedback  PLAN FOR NEXT SESSION: Continue manual work to abdomen, dry needling to scars, kinesiotape to scars for mobility, endurance training, hip and back extensor strength, nustep,  Pelvic floor: manual work to pelvic floor, abdominal work,   If MD signs the initial summary that means he agrees for physical therapist to work on the pelvic floor.    Victorino Dike B. Aramis Zobel, PT 06/20/23 5:13 PM [facility] [address], [address] [place], [place] [address] Phone # [phone] Fax [phone]

## 2023-06-25 ENCOUNTER — Other Ambulatory Visit (INDEPENDENT_AMBULATORY_CARE_PROVIDER_SITE_OTHER): Payer: Medicare Other

## 2023-06-25 ENCOUNTER — Encounter: Payer: Self-pay | Admitting: Internal Medicine

## 2023-06-25 ENCOUNTER — Ambulatory Visit (INDEPENDENT_AMBULATORY_CARE_PROVIDER_SITE_OTHER): Payer: Medicare Other | Admitting: Internal Medicine

## 2023-06-25 VITALS — BP 104/70 | HR 68 | Ht 70.0 in | Wt 155.0 lb

## 2023-06-25 DIAGNOSIS — E781 Pure hyperglyceridemia: Secondary | ICD-10-CM

## 2023-06-25 DIAGNOSIS — K90821 Short bowel syndrome with colon in continuity: Secondary | ICD-10-CM | POA: Diagnosis not present

## 2023-06-25 DIAGNOSIS — Z789 Other specified health status: Secondary | ICD-10-CM

## 2023-06-25 DIAGNOSIS — E43 Unspecified severe protein-calorie malnutrition: Secondary | ICD-10-CM

## 2023-06-25 DIAGNOSIS — R1031 Right lower quadrant pain: Secondary | ICD-10-CM

## 2023-06-25 DIAGNOSIS — K50018 Crohn's disease of small intestine with other complication: Secondary | ICD-10-CM | POA: Diagnosis not present

## 2023-06-25 DIAGNOSIS — K601 Chronic anal fissure: Secondary | ICD-10-CM | POA: Diagnosis not present

## 2023-06-25 DIAGNOSIS — Z7960 Long term (current) use of unspecified immunomodulators and immunosuppressants: Secondary | ICD-10-CM

## 2023-06-25 NOTE — Progress Notes (Signed)
[name] 72 y.o. 05/02/1951 [id number]  Assessment & Plan:   Encounter Diagnoses  Name Primary?   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    Chronic posterior anal fissure    Long-term use of immunosuppressant medication    Difficult intravenous access    RLQ abdominal pain    Protein-calorie malnutrition, severe (HCC) - improved    High triglycerides    Overall he is much better from when I started following him again after his surgical resection and ileostomy takedown.  He will continue his physical therapy.  Follow through with the cystoscopy.  I am going to have him recheck labs as listed below.  I am not sure if the diarrhea is a postop phenomenon, versus active Crohn's.  His last calprotectin was low and normal going against Crohn's flare but he is now having diarrhea again.  We could need to increase his Rinvoq.  He is on 15 mg and in some patients they need 30 mg.  I am going to recheck his lipids as well since his triglycerides are up.  Will plan on arranging follow-up pending these results.  He was asking about whether intravenous medication would be more effective he has poor IV access so I think that would be problematic and I do not think that is necessarily so.  He has not wanted to pursue a repeat colonoscopy and that subject may need to be broached again.    I think the fissure pain is related to increased diarrhea.  He has previously been evaluated for potential Botox or surgical intervention and colorectal surgeon and the patient decided against Botox.  He is getting ready to go away for the holidays and spend time with his grandchildren in the second half of December. I do anticipate discussing this further with an IBD expert who is joining our practice.  Orders Placed This Encounter  Procedures   Calprotectin, Fecal   CBC with Differential/Platelet   Ferritin   Comprehensive metabolic  panel   Lipid panel        Subjective:  Gastroenterology problem summary   Crohn's disease of the small intestine-ileitis in a patient status post small bowel resection as below: June 2013 47 cm small bowel resection incarcerated ventral hernia Small intestine, resection - SMALL BOWEL WITH EXTENSIVE ACUTE SEROSITIS AND ASSOCIATED FAT NECROSIS AND ADHESIONS. - NO ATYPIA OR MALIGNANCY. - RESECTION MARGINS VIABLE.   Original Crohn's diagnosis October 2018 colonoscopy, budesonide therapy then 6-MP plus Humira early 2019, developed pancreatitis from 6-MP.  Additional budesonide and then weekly Humira but antibodies very high 04/05/2018 and loss of efficacy. Prednisone used as budesonide was not helpful, Stelara tried early 2020 and then moved to [facility] fall 2020   2022 having problems with perineal and then abdominal pain and diarrhea progressed into 2023 and in March of that year CT showing enteritis/ileitis referred to Dr. Drue Dun and had surgery       November 11, 2021 small bowel resection, two 9 cm long sections A. SMALL BOWEL ANASTOMOSIS, RESECTION:             - Anastomotic site, present.              - Small bowel mucosa with no significant abnormalities.              - Serosa with fibrous adhesions and histiocytic giant cell reaction to mesh material.    B.  MESH, REMOVAL:             - Mesh with attached adipose and membranous tissue, gross examination only.    C. SMALL BOWEL ANASTOMOSIS #2, RESECTION:             - Chronic active ileitis with pseudopolyp formation and mucosa ulceration.  - Anastomotic site identified.  - See comment.    D. MESH #2 , REMOVAL:             - Mesh with attached adipose tissue, gross examination only.    Several admissions due to high output ileostomy and need for hydration and TPN rehab stays, also complicated by abdominal wall abscess requiring drainage.  Was on home TPN.  Dr. Drue Dun did not want to takedown ileostomy. Was staying with his  sister in [place] (she could help with TPN) and saw Dr. Peri JeffersonChancy Hurter at [name]   08/29/2022 preoperative colonoscopy mild diversion colitis noted throughout the colon exam to long Hartmann stump and   08/30/2022 ileostomy reversal at [facility]      Small intestine (ileostomy trim): -Benign skin small intestinal tissue consistent with ostomy site.   This electronic signature is attestation that the pathologist personally reviewed the submitted material(s) and the final diagnosis reflects that evaluation.  Electronically signed by Rogelia Mire, MD on 08/31/2022 at 10:24 AM  Clinical History    Pre-op diagnosis: CROHN'S DISEASE  Gross Description    A.  Label: Small intestine (ileostomy trim)-permanent Stoma size: 2.2 x 2.1 cm Surrounding skin: 0.5 cm wide rim of lightly pigmented skin Attached bowel: 1.5 cm in length by 1.7 cm in diameter; unremarkable Other findings: An additional 4.5 x 2.2 x 1.5 cm mucosal fragment is present; unremarkable    Postop.  Outpatient, he struggled with hydration some Lomotil helping, some question of short-bowel syndrome consideration for TPN but not used     CT-E 11/08/22 IMPRESSION: 1. Interval ileostomy reversal and ileocolic anastomosis. 2. Multiple distal small bowel and colon resections with the remaining portions of the colon situated in the right hemiabdomen. 3. Mild fat stranding about the ileocolic anastomosis and adjacent descending portion of the colon in the right lower quadrant, similar to prior examination. This may be postoperative or reflect ongoing inflammation. 4. No other inflammatory findings of the bowel. No evidence of stricture, obstruction, fistula, or abscess at this time.   CT abdomen pelvis without contrast (poor IV access) 05/31/2023  IMPRESSION: 1. Postoperative changes from multiple small bowel and colonic resections with right lower quadrant ileocolonic anastomosis. 2. Equivocal wall thickening involving  the neo-terminal ileum. There is also mild wall thickening involving the partially decompressed distal colon. Findings are nonspecific but may reflect mild active inflammatory bowel disease. 3. No signs of active penetrating disease, bowel obstruction or abscess. 4.  Aortic Atherosclerosis (ICD10-I70.0).  Multiple deficiencies-thiamine, potassium iron, vitamin D detected 10/04/2022 follow-up   April and May 2024 visits-Entyvio antibody testing negative, decided to treat with Rinvoq.  He is awaiting that.  Calprotectin 275 in April.  C-reactive protein normal.  Thiamine was low and responded to therapy.  Ferritin 22 B12 and folate normal.  On iron.   Rinvoq begun June 2024 45 mg daily x 2 months then 15 mg daily Calprotectin February 12, 2023 132   Anal fissure -posterior diagnosed June 2024 treated with diltiazem and lidocaine and then nifedipine   Chief Complaint: Follow-up of Crohn's, fissure pain is worse, having more diarrhea  HPI 72 year old white  man with a history of complicated Crohn's disease status post surgical resection of 47 cm of small bowel, who presents for follow-up.  He was last seen by me April 13, 2023.  Subsequent to that visit labs on October 11 showed a fecal calprotectin that was 59 down from 132 4 months ago.  He has had rising triglycerides as well possibly due to Rinvoq.  On October 14 these were 295 and his VLDL was 59.  His LDL was 20.  Total cholesterol 111.  He also had called me complaining of worsening abdominal pain or persistent so we did a CT scan on November 14 that is reflected above in the summary it was unrevealing.  Discussed the use of AI scribe software for clinical note transcription with the patient, who gave verbal consent to proceed.  History of Present Illness   The patient, with a history of Crohn's disease and post-surgical complications, presents with persistent rectal pain and diarrhea. He reports up to ten bowel movements in a 24-hour  period, which are often watery, but occasionally semi-formed. The frequency of bowel movements is inconsistent, with some days having as few as two. The patient also reports occasional dark, black stools.  The patient has been experiencing sharp, non-nocturnal abdominal pain, particularly on the right side. He speculates this could be due to adhesions from previous surgery. The pain is exacerbated by bending and twisting but tends to improve with these movements over time, also.  In addition to abdominal pain, the patient reports general pain in the abdomen and pelvis, for which he will take hydrocodone, primarily at night. The rectal pain is more pronounced post-defecation and is somewhat alleviated by the use of topical diltiazem lidocaine.  The patient also reports pelvic pain, distinct from the abdominal pain, and has been referred to urology for a cystoscopy. He has a history of multiple symptomatic prostate infections, with the most recent episode occurring a couple of months ago.  The patient is currently undergoing physical therapy for abdominal pain and pelvic floor issues. He has also made dietary modifications, such as reducing beef intake, in an attempt to manage his symptoms. Despite these challenges, the patient notes a significant improvement in his condition compared to a year ago.       Wt Readings from Last 3 Encounters:  05/25/23 157 lb 3.2 oz (71.3 kg)  04/25/23 154 lb 3.2 oz (69.9 kg)  04/18/23 155 lb (70.3 kg)    Allergies  Allergen Reactions   Purinethol [Mercaptopurine] Other (See Comments)    Pancreatitis    Shellfish Allergy Anaphylaxis and Swelling    Can use standard SMOF lipid formulation for TPN without any issue.    Humira [Adalimumab] Other (See Comments)    Developed antibodies   Tape Rash   Wound Dressing Adhesive Rash   Current Meds  Medication Sig   acetaminophen (TYLENOL) 325 MG tablet Take 650 mg by mouth every 6 (six) hours as needed for moderate  pain.   albuterol (VENTOLIN HFA) 108 (90 Base) MCG/ACT inhaler Inhale 2 puffs into the lungs every 6 (six) hours as needed for wheezing or shortness of breath.   AMBULATORY NON FORMULARY MEDICATION Medication Name: nifedipine 0.2%/lidocaine 5% ointment 1:1 mix Apply 3-4 tmes/day into anal canal   ascorbic acid (VITAMIN C) 250 MG CHEW Chew by mouth.   busPIRone (BUSPAR) 10 MG tablet Take 10 mg by mouth every morning.   calcium carbonate (TUMS - DOSED IN MG ELEMENTAL CALCIUM) 500 MG chewable tablet  Chew 500 mg by mouth 3 (three) times daily as needed for indigestion or heartburn.   Cholecalciferol (VITAMIN D) 50 MCG (2000 UT) tablet Take 1 tablet (2,000 Units total) by mouth daily.   diphenoxylate-atropine (LOMOTIL) 2.5-0.025 MG tablet Take 2 tablets by mouth 4 (four) times daily.   ferrous sulfate 325 (65 FE) MG tablet Take 1 tablet (325 mg total) by mouth daily with breakfast.   finasteride (PROSCAR) 5 MG tablet Take 5 mg by mouth daily.   gabapentin (NEURONTIN) 100 MG capsule Take 1 capsule (100 mg total) by mouth 3 (three) times daily.   HYDROcodone-acetaminophen (NORCO) 5-325 MG tablet Take 1 tablet by mouth at bedtime as needed for moderate pain (pain score 4-6).   hyoscyamine (LEVSIN SL) 0.125 MG SL tablet Place 0.125 mg under the tongue every 6 (six) hours as needed for cramping.   magnesium oxide (MAG-OX) 400 MG tablet Take 1 tablet (400 mg total) by mouth 2 (two) times daily.   melatonin 3 MG TABS tablet Take 3 mg by mouth at bedtime.   Multiple Vitamin (MULTIVITAMIN WITH MINERALS) TABS tablet Take 1 tablet by mouth daily.   ondansetron (ZOFRAN-ODT) 4 MG disintegrating tablet Dissolve 1 tablet (4 mg total) by mouth every 6 (six) hours as needed for nausea or vomiting.   pantoprazole (PROTONIX) 40 MG tablet Take 40 mg by mouth daily.   phenazopyridine (PYRIDIUM) 200 MG tablet Take 1 tablet (200 mg total) by mouth 3 (three) times daily as needed (urinary pain).   Potassium Chloride ER 20  MEQ TBCR TAKE 1 TABLET(20 MEQ) BY MOUTH TWICE DAILY   simethicone (MYLICON) 80 MG chewable tablet Chew 80 mg by mouth 4 (four) times daily -  with meals and at bedtime.   thiamine (VITAMIN B1) 100 MG tablet Take 1 tablet (100 mg total) by mouth daily.   traZODone (DESYREL) 100 MG tablet Take 200 mg by mouth at bedtime.   Upadacitinib ER (RINVOQ) 15 MG TB24 Take 15 mg by mouth daily.   valACYclovir (VALTREX) 1000 MG tablet Take one tablet 3 times a day for 7 days as needed for shingles flares   Past Medical History:  Diagnosis Date   Acute prostatitis 07/24/2007   Qualifier: Diagnosis of  By: Clent Ridges MD, Jeannett Senior A    Acute respiratory failure with hypoxia (HCC)    Allergy    mild   Arthritis    osteoarthritis   Aspiration pneumonia (HCC) 05/24/2022   Asthma    Blood transfusion without reported diagnosis    BPH (benign prostatic hypertrophy) with urinary obstruction    Crohn's ileitis (HCC) suspected 05/03/2017   Dilated aortic root (HCC)    noted on echo 08/2012   Diverticulitis of colon    Drug-induced acute pancreatitis without infection or necrosis - from 6 MP 09/24/2017   EPIDIDYMITIS 02/15/2010   Qualifier: Diagnosis of  By: Clent Ridges MD, Tera Mater    GERD (gastroesophageal reflux disease)    H/O: GI bleed    Hemorrhoids    Hepatitis 1975   unknown type    HERPES SIMPLEX INFECTION 10/14/2007   Qualifier: Diagnosis of  By: Clent Ridges MD, Jeannett Senior A    Hiatal hernia    Ileus following gastrointestinal surgery (HCC) 12/26/2011   Long term (current) use of systemic steroids 06/18/2018   Psoriasis    sees Dr. Karene Fry at [facility].   Rectal fissure    Recurrent ventral incisional hernia 05/10/2012   Shingles    Short bowel syndrome  SVT (supraventricular tachycardia) (HCC)    Ulcer 08/21/2013   ileal   Past Surgical History:  Procedure Laterality Date   BOWEL RESECTION  12/19/2011   Procedure: SMALL BOWEL RESECTION;  Surgeon: Mariella Saa, MD;  Location: [facility];   Service: General;  Laterality: N/A;  with anastamosis and insertion mesh   BRONCHOSCOPY     COLON SURGERY  01/2004   x 2   COLONOSCOPY W/ BIOPSIES  04/26/2017   per Dr. Leone Payor, no polyps, benign inflammation, repeat in 5 yrs    CYSTOSCOPY     ESOPHAGOGASTRODUODENOSCOPY     HEMICOLECTOMY     left side, at [facility], diverticulitis   HEMORRHOID BANDING     HERNIA REPAIR     [phone] incisional hernia   ILEOSTOMY     ILEOSTOMY CLOSURE     INSERTION OF MESH  07/31/2012   Procedure: INSERTION OF MESH;  Surgeon: Mariella Saa, MD;  Location: [facility];  Service: General;;   LAPAROTOMY  12/19/2011   Procedure: EXPLORATORY LAPAROTOMY;  Surgeon: Mariella Saa, MD;  Location: [facility];  Service: General;  Laterality: N/A;   PACEMAKER IMPLANT N/A 12/01/2020   Procedure: PACEMAKER IMPLANT;  Surgeon: Regan Lemming, MD;  Location: [facility];  Service: Cardiovascular;  Laterality: N/A;   PACEMAKER INSERTION Left    TONSILLECTOMY     UPPER GASTROINTESTINAL ENDOSCOPY     VASECTOMY     VENTRAL HERNIA REPAIR  07/31/2012   Procedure: HERNIA REPAIR VENTRAL ADULT;  Surgeon: Mariella Saa, MD;  Location: [facility];  Service: General;  Laterality: N/A;   Social History   Social History Narrative   He is married with 2 sons 1 son is an Acupuncturist and the 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   Review of Systems   Objective:   Physical Exam BP 104/70   Pulse 68   Ht 5\' 10"  (1.778 m)   BMI 22.56 kg/m  Physical Exam   MEASUREMENTS: WT- 158 CHEST:  Lungs clear to auscultation. CARDIOVASCULAR: Heart sounds normal, S1 and S2 distant. ABDOMEN: Abdomen is thin. Bowel sounds present. Tenderness in the right and left mid quadrants. No rebound or guarding. Multiple surgical scars, well healed. RECTAL: Inspection of the anus shows slight erythema but no rash. Posterior tenderness. Digital examination reveals posterior fissure with induration, mildly tender.

## 2023-06-25 NOTE — Patient Instructions (Signed)
Your provider has requested that you go to the basement level for lab work before leaving today. Press "B" on the elevator. The lab is located at the first door on the left as you exit the elevator. Pick up your stool container today and come back fasting for the blood work.  _______________________________________________________  If your blood pressure at your visit was 140/90 or greater, please contact your primary care physician to follow up on this.  _______________________________________________________  If you are age 55 or older, your body mass index should be between 23-30. Your Body mass index is 22.24 kg/m. If this is out of the aforementioned range listed, please consider follow up with your Primary Care Provider.  If you are age 29 or younger, your body mass index should be between 19-25. Your Body mass index is 22.24 kg/m. If this is out of the aformentioned range listed, please consider follow up with your Primary Care Provider.   ________________________________________________________  The [facility] providers would like to encourage you to use [facility] to communicate with providers for non-urgent requests or questions.  Due to long hold times on the telephone, sending your provider a message by [facility] may be a faster and more efficient way to get a response.  Please allow 48 business hours for a response.  Please remember that this is for non-urgent requests.  _______________________________________________________  I appreciate the opportunity to care for you. Stan Head, MD, [facility]

## 2023-06-26 LAB — COMPREHENSIVE METABOLIC PANEL
ALT: 38 U/L (ref 0–53)
AST: 36 U/L (ref 0–37)
Albumin: 4.3 g/dL (ref 3.5–5.2)
Alkaline Phosphatase: 45 U/L (ref 39–117)
BUN: 12 mg/dL (ref 6–23)
CO2: 31 meq/L (ref 19–32)
Calcium: 9.4 mg/dL (ref 8.4–10.5)
Chloride: 102 meq/L (ref 96–112)
Creatinine, Ser: 1.12 mg/dL (ref 0.40–1.50)
GFR: 66.58 mL/min (ref >60.00)
Glucose, Bld: 80 mg/dL (ref 70–99)
Potassium: 4.6 meq/L (ref 3.5–5.1)
Sodium: 139 meq/L (ref 135–145)
Total Bilirubin: 0.9 mg/dL (ref 0.2–1.2)
Total Protein: 6.4 g/dL (ref 6.0–8.3)

## 2023-06-26 LAB — CBC WITH DIFFERENTIAL/PLATELET
Basophils Absolute: 0 10*3/uL (ref 0.0–0.1)
Basophils Relative: 0.2 % (ref 0.0–3.0)
Eosinophils Absolute: 0 10*3/uL (ref 0.0–0.7)
Eosinophils Relative: 0.5 % (ref 0.0–5.0)
HCT: 39 % (ref 39.0–52.0)
Hemoglobin: 12.9 g/dL — ABNORMAL LOW (ref 13.0–17.0)
Lymphocytes Relative: 28.2 % (ref 12.0–46.0)
Lymphs Abs: 1.7 10*3/uL (ref 0.7–4.0)
MCHC: 33.1 g/dL (ref 30.0–36.0)
MCV: 97.5 fL (ref 78.0–100.0)
Monocytes Absolute: 0.4 10*3/uL (ref 0.1–1.0)
Monocytes Relative: 5.9 % (ref 3.0–12.0)
Neutro Abs: 3.9 10*3/uL (ref 1.4–7.7)
Neutrophils Relative %: 65.2 % (ref 43.0–77.0)
Platelets: 143 10*3/uL — ABNORMAL LOW (ref 150.0–400.0)
RBC: 4 Mil/uL — ABNORMAL LOW (ref 4.22–5.81)
RDW: 13.4 % (ref 11.5–15.5)
WBC: 6.1 10*3/uL (ref 4.0–10.5)

## 2023-06-26 LAB — FERRITIN: Ferritin: 109 ng/mL (ref 22.0–322.0)

## 2023-06-26 LAB — LIPID PANEL
Cholesterol: 95 mg/dL (ref 0–200)
HDL: 34.1 mg/dL — ABNORMAL LOW (ref >39.00)
LDL Cholesterol: 9 mg/dL (ref 0–99)
NonHDL: 61.37
Total CHOL/HDL Ratio: 3
Triglycerides: 263 mg/dL — ABNORMAL HIGH (ref 0.0–149.0)
VLDL: 52.6 mg/dL — ABNORMAL HIGH (ref 0.0–40.0)

## 2023-06-27 ENCOUNTER — Ambulatory Visit: Payer: Medicare Other

## 2023-06-27 DIAGNOSIS — R1084 Generalized abdominal pain: Secondary | ICD-10-CM

## 2023-06-27 DIAGNOSIS — R293 Abnormal posture: Secondary | ICD-10-CM

## 2023-06-27 DIAGNOSIS — L905 Scar conditions and fibrosis of skin: Secondary | ICD-10-CM

## 2023-06-27 DIAGNOSIS — R102 Pelvic and perineal pain: Secondary | ICD-10-CM

## 2023-06-27 DIAGNOSIS — R252 Cramp and spasm: Secondary | ICD-10-CM

## 2023-06-27 DIAGNOSIS — R262 Difficulty in walking, not elsewhere classified: Secondary | ICD-10-CM

## 2023-06-27 DIAGNOSIS — M6281 Muscle weakness (generalized): Secondary | ICD-10-CM

## 2023-06-27 NOTE — Therapy (Signed)
OUTPATIENT PHYSICAL THERAPY MALE PELVIC TREATMENT   Patient Name: [name] MRN: [id number] DOB:11-18-1950, 72 y.o., male Today's Date: 06/27/2023  END OF SESSION:  PT End of Session - 06/27/23 1719     Visit Number 4    Date for PT Re-Evaluation 07/13/23    Authorization Type medicare    Authorization - Number of Visits 10    PT Start Time 1404    PT Stop Time 1444    PT Time Calculation (min) 40 min    Activity Tolerance Patient tolerated treatment well    Behavior During Therapy [facility] for tasks assessed/performed             Past Medical History:  Diagnosis Date   Acute prostatitis 07/24/2007   Qualifier: Diagnosis of  By: Clent Ridges MD, Jeannett Senior A    Acute respiratory failure with hypoxia (HCC)    Allergy    mild   Arthritis    osteoarthritis   Aspiration pneumonia (HCC) 05/24/2022   Asthma    Blood transfusion without reported diagnosis    BPH (benign prostatic hypertrophy) with urinary obstruction    Crohn's ileitis (HCC) suspected 05/03/2017   Dilated aortic root (HCC)    noted on echo 08/2012   Diverticulitis of colon    Drug-induced acute pancreatitis without infection or necrosis - from 6 MP 09/24/2017   EPIDIDYMITIS 02/15/2010   Qualifier: Diagnosis of  By: Clent Ridges MD, Tera Mater    GERD (gastroesophageal reflux disease)    H/O: GI bleed    Hemorrhoids    Hepatitis 1975   unknown type    HERPES SIMPLEX INFECTION 10/14/2007   Qualifier: Diagnosis of  By: Clent Ridges MD, Jeannett Senior A    Hiatal hernia    Ileus following gastrointestinal surgery (HCC) 12/26/2011   Long term (current) use of systemic steroids 06/18/2018   Psoriasis    sees Dr. Karene Fry at [facility]   Rectal fissure    Recurrent ventral incisional hernia 05/10/2012   Shingles    Short bowel syndrome    SVT (supraventricular tachycardia) (HCC)    Ulcer 08/21/2013   ileal   Past Surgical History:  Procedure Laterality Date   BOWEL RESECTION  12/19/2011   Procedure: SMALL BOWEL RESECTION;   Surgeon: Mariella Saa, MD;  Location: [facility];  Service: General;  Laterality: N/A;  with anastamosis and insertion mesh   BRONCHOSCOPY     COLON SURGERY  01/2004   x 2   COLONOSCOPY W/ BIOPSIES  04/26/2017   per Dr. Leone Payor, no polyps, benign inflammation, repeat in 5 yrs    CYSTOSCOPY     ESOPHAGOGASTRODUODENOSCOPY     HEMICOLECTOMY     left side, at [name], diverticulitis   HEMORRHOID BANDING     HERNIA REPAIR     [phone] incisional hernia   ILEOSTOMY     ILEOSTOMY CLOSURE     INSERTION OF MESH  07/31/2012   Procedure: INSERTION OF MESH;  Surgeon: Mariella Saa, MD;  Location: [facility];  Service: General;;   LAPAROTOMY  12/19/2011   Procedure: EXPLORATORY LAPAROTOMY;  Surgeon: Mariella Saa, MD;  Location: [facility];  Service: General;  Laterality: N/A;   PACEMAKER IMPLANT N/A 12/01/2020   Procedure: PACEMAKER IMPLANT;  Surgeon: Regan Lemming, MD;  Location: [facility];  Service: Cardiovascular;  Laterality: N/A;   PACEMAKER INSERTION Left    TONSILLECTOMY     UPPER GASTROINTESTINAL ENDOSCOPY     VASECTOMY  VENTRAL HERNIA REPAIR  07/31/2012   Procedure: HERNIA REPAIR VENTRAL ADULT;  Surgeon: Mariella Saa, MD;  Location: [facility];  Service: General;  Laterality: N/A;   Patient Active Problem List   Diagnosis Date Noted   Difficult intravenous access 06/25/2023   Generalized weakness 05/24/2022   Drug overdose, intentional, initial encounter (HCC) 05/24/2022   Chronic pain syndrome 05/24/2022   Chronic anemia    Chronic prostatitis 05/01/2022   Bladder calculi 05/01/2022   Dysuria    Pelvic floor dysfunction 04/27/2022   Crohn's disease of small intestine with other complication (HCC) 02/13/2022   Protein-calorie malnutrition, severe 01/05/2022   Pacemaker 01/03/2022   External hemorrhoid, thrombosed 05/27/2019   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   Long-term use of immunosuppressant medication  -Rinvoq 06/28/2017   Crohn's  ileitis (HCC)  05/03/2017   Psoriasis 08/23/2016   Insomnia 04/22/2014   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/06/2012   SVT (supraventricular tachycardia) (HCC) 08/04/2012   Vitamin D deficiency 06/18/2012   Attention deficit hyperactivity disorder (ADHD) 05/28/2009   BPH (benign prostatic hyperplasia) 07/24/2007   Asthma 03/06/2007   GERD 03/06/2007   DIVERTICULITIS, HX OF 03/06/2007    PCP: [name], MD   REFERRING PROVIDER: Iva Boop, MD   REFERRING DIAG: R19.8 (ICD-10-CM) - Abdominal weakness   THERAPY DIAG:  Muscle weakness (generalized)  Cramp and spasm  Generalized abdominal pain  Scar adherent  Abnormal posture  Pelvic pain  Difficulty in walking, not elsewhere classified  Rationale for Evaluation and Treatment: Rehabilitation  ONSET DATE: 2005  SUBJECTIVE:                                                                                                                                                                                           SUBJECTIVE STATEMENT: Patient states he is no feeling well today.  Comes in with mask and states he is congested and tired.  However, he states he is feeling some improvement in his abdominal and pelvic floor pain.   Fluid intake: Yes: does not drink caffeine, water     PAIN:  Are you having pain? Yes: NPRS scale: 3/10 Pain location: abdomen Pain description: constant Aggravating factors: stress Relieving factors: treatment  PAIN:  Are you having pain? Yes NPRS scale: 4-8/10 Pain location: between anus and scrotum Pain type: dull and pressure,sharp Pain description: constant more at night  Aggravating factors: sitting, urinating, bowel movement,  Relieving factors: walking, moving  PRECAUTIONS: ICD/Pacemaker  RED FLAGS: None   WEIGHT BEARING RESTRICTIONS: No  FALLS:  Has patient fallen in last 6 months? Yes. Number of falls water main cover  was not put back properly and tripped on it and  recovered so not due to balance  LIVING ENVIRONMENT: Lives with: lives with their spouse   OCCUPATION: retired  PLOF: Independent  PATIENT GOALS: build endurance back, strength of abdominal wall, reduction of pelvic pain  PERTINENT HISTORY:  Chron's disease of ilium; short bowel syndrome; chronic posterior anal fissure;, small bowel anastomosis's resection x 2, mesh removal x2; ileostomy closure  BOWEL MOVEMENT: Pain with bowel movement: No Type of bowel movement:Type (Bristol Stool Scale) Type 7, Frequency 5, Strain No, and Splinting no  goes multiple times due to short bowel syndrome Fully empty rectum: Yes:   Leakage: Yes: sometimes Pads: Yes: diaper Fiber supplement: No he is not suppose to  URINATION: Pain with urination: Yes Fully empty bladder: Yes:   Stream: Strong Urgency: Yes: sometimes Frequency: every 2 hours Leakage:  none   INTERCOURSE:Not active     OBJECTIVE:  Note: Objective measures were completed at Evaluation unless otherwise noted.   COGNITION: Overall cognitive status: Within functional limits for tasks assessed       FUNCTIONAL TESTS:  6 minute walk test: exertion 5 for 1601 feet  GAIT: Distance walked: 6 minutes walked 1601 feet with exertion of 5 moderate Assistive device utilized: None Level of assistance: Complete Independence Comments: walks with decreased trunk extension, decreased hip extension  POSTURE: rounded shoulders, forward head, decreased lumbar lordosis, increased thoracic kyphosis, posterior pelvic tilt, and flexed trunk   PELVIC ALIGNMENT: ASIS is equal  LUMBARAROM/PROM:  A/PROM A/PROM  eval  Flexion full  Extension 100% limitation  Right lateral flexion Decreased by 25%  Left lateral flexion Decreased by 25%  Right rotation Decreased by 25%  Left rotation Decreased by 25%   (Blank rows = not tested)    LOWER EXTREMITY MMT:  MMT Right eval Left eval  Hip extension 3/5 3/5  Hip abduction 4/5 3+/5    PALPATION:   General  rib cage is restricted, multiple abdominal scars that are restricted. Unable to tighten his stomach and will lift up his rib cage instead.                 External Perineal Exam ischiocavernosus, bulbocavernosus,                              Internal Pelvic Floor tenderness located in bilateral levator ani, obturator internist, coccygeus  Patient confirms identification and approves PT to assess internal pelvic floor and treatment Yes  PELVIC MMT:   MMT eval  Internal Anal Sphincter 1/5  External Anal Sphincter 1/5  Puborectalis 2/5  (Blank rows = not tested)        TONE: increased   TODAY'S TREATMENT:                                                                                                                              DATE: 06/20/23  Manual scar tissue mobility, abdominal massage, general soft tissue mobility K tape to abdominal scars   DATE: 06/20/23  Nustep x 5 min level 4 Seated upright posture with overhead lateral reach stretch x 10 Seated scapular retraction x 20 Seated green tband rows with PT anchoring  2 X 10 Seated bilateral ER x 20 with red band Seated bilateral shoulder horizontal abduction x 20 with red tband Supine hip flexor stretch (SKTC with manual overpressure) Hooklying trunk rotation x 5 each side Removed Ktape: decided to hold on re-applying due to slight tenderness  DATE: 05/23/23  Nustep x 5 min level 4 Seated upright posture with overhead lateral reach stretch x 10 Seated scapular retraction x 20 Seated green tband rows with PT anchoring x 0 Seated bilateral ER x 20 Seated bilateral shoulder horizontal abduction x 20 with green tband Standing facing wall UE snow angels x 10 Supine abdominal massage x 10 min Fwd bent over table hip extension x 20 alternating Supine k tape technique : midline anchor with 3 strips horizontally with direction of pull to midline  DATE: 05/18/23  EVAL see below   PATIENT EDUCATION:   Education details: lay on his stomach and add in prop on elbows to elongate his anterior trunk.  Person educated: Patient Education method: Medical illustrator Education comprehension: verbalized understanding and returned demonstration  HOME EXERCISE PROGRAM: See above.   ASSESSMENT:  CLINICAL IMPRESSION: [name] was not feeling well today but does report he is feeling some relief with the therapy.  He reports decreased pelvic floor pain since starting PT.  We focused on all manual techniques today.  He would benefit from continued skilled PT for postural strengthening and STM to the abdominal area.     He has tenderness in the abdomen, levator ani, obturator internist, coccygeus, bulbocavernosus, and ischiocavernosus. External and internal anal sphincter strength is 1/5 and puborectalis is 2/5. Patient reports fecal leakage and will wear a diaper due to this. His stool is Type 7 and going 5 times per day due to short bowel syndrome. Patient will benefit from skilled therapy to improve overall endurance, posture, strength and reduce his pain.   OBJECTIVE IMPAIRMENTS: Abnormal gait, decreased activity tolerance, decreased coordination, decreased endurance, difficulty walking, decreased ROM, decreased strength, increased fascial restrictions, increased muscle spasms, impaired tone, postural dysfunction, and pain.   ACTIVITY LIMITATIONS: sitting, standing, continence, toileting, and locomotion level  PARTICIPATION LIMITATIONS: shopping and community activity  PERSONAL FACTORS: Fitness, Time since onset of injury/illness/exacerbation, and 3+ comorbidities: Chron's disease of ilium; short bowel syndrome; chronic posterior anal fissure;, small bowel anastomosis's resection x 2, mesh removal x2; ileostomy closure  are also affecting patient's functional outcome.   REHAB POTENTIAL: Excellent  CLINICAL DECISION MAKING: Evolving/moderate complexity  EVALUATION COMPLEXITY:  Moderate   GOALS: Goals reviewed with patient? Yes  SHORT TERM GOALS: Target date: 06/15/23  Patient independent with initial HEP for strengthening and flexibility.  Baseline: Goal status: INITIAL  2.  Patient understands how to perform scar massage to lengthen the tissue.  Baseline:  Goal status: INITIAL  3.  Patient reports his abdominal pain decreased >/= 25 % due to reduction of restrictions.  Baseline:  Goal status: INITIAL  4.  Patient is able to contract his abdominals with not lifting his rib cage.  Baseline:  Goal status: INITIAL  5.  Patient has increased lumbar extension by 25% due to increased lumbar ROM and length of his abdominals.  Baseline:  Goal status: INITIAL  6.  Patient  able to do the 6 minute walk test with exertion </= 2/10.  Baseline:  Goal status: INITIAL  LONG TERM GOALS: Target date: 07/13/23  Patient independent with advanced HEP for strength, flexibility, and endurance.  Baseline:  Goal status: INITIAL  2.  Patient pelvic floor strength >/= 3/5 holding for 40 seconds so his fecal leakage decreased >/= 75%.  Baseline:  Goal status: INITIAL  3.  Patient is able to walk >/= 1729 feet in 6 minutes with 0-1 exertion due to increased strength and rib mobility.  Baseline:  Goal status: INITIAL  4.  Patient is able to stand upright due to increased length of the anterior trunk and lumbar extension improve by 50%.  Baseline:  Goal status: INITIAL  5.  Patient has reduction of pelvic pain decreased >/= 50% due to improve abdominal strength and elongation.  Baseline:  Goal status: INITIAL   PLAN:  PT FREQUENCY: 2x/week  PT DURATION: 8 weeks  PLANNED INTERVENTIONS: 97110-Therapeutic exercises, 97530- Therapeutic activity, 97112- Neuromuscular re-education, 97140- Manual therapy, [phone]- Gait training, 91478- Ultrasound, Patient/Family education, Balance training, Dry Needling, Joint manipulation, Spinal mobilization, Scar mobilization,  Cryotherapy, Moist heat, and Biofeedback  PLAN FOR NEXT SESSION: Continue manual work to abdomen, dry needling to scars, kinesiotape to scars for mobility, endurance training, hip and back extensor strength, nustep,  Pelvic floor: manual work to pelvic floor, abdominal work,  If MD signs the initial summary that means he agrees for physical therapist to work on the pelvic floor.    Victorino Dike B. Jashley Yellin, PT 06/27/23 5:26 PM [facility] [address] [place], [place] [address] Phone # [phone] Fax [phone]

## 2023-06-28 ENCOUNTER — Telehealth: Payer: Self-pay | Admitting: Pharmacy Technician

## 2023-06-28 ENCOUNTER — Telehealth: Payer: Self-pay | Admitting: Internal Medicine

## 2023-06-28 ENCOUNTER — Other Ambulatory Visit (HOSPITAL_COMMUNITY): Payer: Self-pay

## 2023-06-28 LAB — CALPROTECTIN, FECAL: Calprotectin, Fecal: 73 ug/g (ref 0–120)

## 2023-06-28 MED ORDER — RIFAXIMIN 550 MG PO TABS: 550.0000 mg | ORAL_TABLET | Freq: Three times a day (TID) | ORAL | 0 refills | Status: DC

## 2023-06-28 NOTE — Telephone Encounter (Signed)
Pharmacy Patient Advocate Encounter   Received notification from Pt Calls Messages that prior authorization for XIFAXAN 550MG  is required/requested.   Insurance verification completed.   The patient is insured through [facility] .   Per test claim: PA required; PA submitted to above mentioned insurance via CoverMyMeds Key/confirmation #/EOC [id number] Status is pending

## 2023-06-28 NOTE — Telephone Encounter (Signed)
PA has been submitted, and telephone encounter has been created. 

## 2023-06-28 NOTE — Telephone Encounter (Signed)
Patient made aware of MD recommendations, and states he already received a call in regards to the prior auth that is needed for Xifaxan.

## 2023-06-28 NOTE — Telephone Encounter (Signed)
Spoke with patient regarding results & recommendations. OV scheduled for 09/20/22 at 3:30 pm. Patient mentioned that Dr. Leone Payor previously mentioned potentially increasing his Rinvoq, and asked that I send him a message to clarify.

## 2023-06-28 NOTE — Telephone Encounter (Signed)
Since my thought is this is not the Crohn's want to see what happens with this Xifaxan treatment before we make any other changes to treatment regimen

## 2023-06-28 NOTE — Telephone Encounter (Signed)
Please tell patient that the fecal calprotectin remains low which makes me think his diarrhea is from bowel resection and not Crohn's  I have rxed Xifaxan to see if that helps (he may have SIBO)  Meds ordered this encounter  Medications   rifaximin (XIFAXAN) 550 MG TABS tablet    Sig: Take 1 tablet (550 mg total) by mouth 3 (three) times daily.    Dispense:  42 tablet    Refill:  0   Tell him it will require prior authorization - I think - and if it is crazy expensive don't buy it and will try an alternative  Other labs were ok - triglycerides down some iron level good though slightly low Hgb and PLT's - we can monitor  Also please schedule a follow-up for March with me

## 2023-07-05 NOTE — Telephone Encounter (Signed)
Yes he can take this BID if needed

## 2023-07-07 ENCOUNTER — Other Ambulatory Visit: Payer: Self-pay | Admitting: Family Medicine

## 2023-07-09 NOTE — Telephone Encounter (Signed)
Pt LOV was 05/25/23 Last refill done on 05/25/23, Rx is too soon to refill Please advise

## 2023-07-12 ENCOUNTER — Other Ambulatory Visit: Payer: Self-pay | Admitting: Internal Medicine

## 2023-07-12 ENCOUNTER — Other Ambulatory Visit (HOSPITAL_COMMUNITY): Payer: Self-pay

## 2023-07-12 ENCOUNTER — Telehealth: Payer: Self-pay | Admitting: Internal Medicine

## 2023-07-12 NOTE — Telephone Encounter (Signed)
The approved Xifaxan was going to cost over $700. We were able to find him samples and he will come early next week to pick them up. He was very Adult nurse.

## 2023-07-12 NOTE — Telephone Encounter (Signed)
FYI Spoke with Pt, state that Rx was increased to taking twice daily as needed. States that is the reason he is almost out of the med. Pt has a PMV for 07/20/23 with Dr Clent Ridges

## 2023-07-12 NOTE — Telephone Encounter (Signed)
PA request has been Approved. New Encounter created for follow up. For additional info see Pharmacy Prior Auth telephone encounter from 12/12.   Per test claim medication was filled 12/22

## 2023-07-12 NOTE — Telephone Encounter (Signed)
Pharmacy Patient Advocate Encounter  Received notification from [facility] that Prior Authorization for Xifaxan 550MG  tablets  has been APPROVED from 06/28/2023 to 07/12/2023. Unable to obtain price due to refill too soon rejection, last fill date 12/22 next available fill date N/A

## 2023-07-12 NOTE — Telephone Encounter (Signed)
If he will be taking this medication on an ongoing basis, he will need to make an OV to start a pain management program

## 2023-07-12 NOTE — Telephone Encounter (Signed)
Please schedule this patient with Dr. Doy Hutching for a second opinion regarding his Crohn's disease.  Sheri said we can use the RN visits.  Once it is scheduled please let me know the date  Thank you

## 2023-07-13 NOTE — Telephone Encounter (Signed)
Left message for patient to call back  

## 2023-07-17 ENCOUNTER — Ambulatory Visit: Payer: Medicare Other

## 2023-07-17 DIAGNOSIS — R293 Abnormal posture: Secondary | ICD-10-CM

## 2023-07-17 DIAGNOSIS — R1084 Generalized abdominal pain: Secondary | ICD-10-CM

## 2023-07-17 DIAGNOSIS — L905 Scar conditions and fibrosis of skin: Secondary | ICD-10-CM

## 2023-07-17 DIAGNOSIS — R252 Cramp and spasm: Secondary | ICD-10-CM

## 2023-07-17 DIAGNOSIS — R102 Pelvic and perineal pain: Secondary | ICD-10-CM

## 2023-07-17 DIAGNOSIS — M6281 Muscle weakness (generalized): Secondary | ICD-10-CM

## 2023-07-17 NOTE — Telephone Encounter (Signed)
 Pt made aware of Dr. Leone Payor recommendations: Pt was notified that he has been scheduled to see Dr. Doy Hutching on 07/31/2023 at 3:40 PM Pt verbalized understanding with all questions answered.  Routed as [employer]

## 2023-07-17 NOTE — Telephone Encounter (Signed)
 Pt was scheduled to see Dr. Doy Hutching on 07/31/2023 at 3:40: Left message for pt to call back

## 2023-07-17 NOTE — Therapy (Addendum)
 OUTPATIENT PHYSICAL THERAPY MALE PELVIC TREATMENT   Patient Name: [name] MRN: [id number] DOB:[birth year], 72 y.o., male Today's Date: 07/17/2023  END OF SESSION:  PT End of Session - 07/17/23 1414     Visit Number 5    Date for PT Re-Evaluation 07/13/23    Authorization Type medicare    Authorization - Visit Number 5    Authorization - Number of Visits 10    PT Start Time 1403    PT Stop Time 1445    PT Time Calculation (min) 42 min    Activity Tolerance Patient tolerated treatment well    Behavior During Therapy [facility] for tasks assessed/performed             Past Medical History:  Diagnosis Date   Acute prostatitis 07/24/2007   Qualifier: Diagnosis of  By: Johnny MD, Garnette A    Acute respiratory failure with hypoxia (HCC)    Allergy    mild   Arthritis    osteoarthritis   Aspiration pneumonia (HCC) 05/24/2022   Asthma    Blood transfusion without reported diagnosis    BPH (benign prostatic hypertrophy) with urinary obstruction    Crohn's ileitis (HCC) suspected 05/03/2017   Dilated aortic root (HCC)    noted on echo 08/2012   Diverticulitis of colon    Drug-induced acute pancreatitis without infection or necrosis - from 6 MP 09/24/2017   EPIDIDYMITIS 02/15/2010   Qualifier: Diagnosis of  By: Johnny MD, Garnette LABOR    GERD (gastroesophageal reflux disease)    H/O: GI bleed    Hemorrhoids    Hepatitis 1975   unknown type    HERPES SIMPLEX INFECTION 10/14/2007   Qualifier: Diagnosis of  By: Johnny MD, Garnette A    Hiatal hernia    Ileus following gastrointestinal surgery (HCC) 12/26/2011   Long term (current) use of systemic steroids 06/18/2018   Psoriasis    sees Dr. Donnice Sims at [facility].   Rectal fissure    Recurrent ventral incisional hernia 05/10/2012   Shingles    Short bowel syndrome    SVT (supraventricular tachycardia) (HCC)    Ulcer 08/21/2013   ileal   Past Surgical History:  Procedure Laterality Date   BOWEL RESECTION  12/19/2011    Procedure: SMALL BOWEL RESECTION;  Surgeon: Morene ONEIDA Olives, MD;  Location: [facility];  Service: General;  Laterality: N/A;  with anastamosis and insertion mesh   BRONCHOSCOPY     COLON SURGERY  01/2004   x 2   COLONOSCOPY W/ BIOPSIES  04/26/2017   per Dr. Avram, no polyps, benign inflammation, repeat in 5 yrs    CYSTOSCOPY     ESOPHAGOGASTRODUODENOSCOPY     HEMICOLECTOMY     left side, at [facility], diverticulitis   HEMORRHOID BANDING     HERNIA REPAIR     [phone] incisional hernia   ILEOSTOMY     ILEOSTOMY CLOSURE     INSERTION OF MESH  07/31/2012   Procedure: INSERTION OF MESH;  Surgeon: Morene ONEIDA Olives, MD;  Location: [facility];  Service: General;;   LAPAROTOMY  12/19/2011   Procedure: EXPLORATORY LAPAROTOMY;  Surgeon: Morene ONEIDA Olives, MD;  Location: [facility];  Service: General;  Laterality: N/A;   PACEMAKER IMPLANT N/A 12/01/2020   Procedure: PACEMAKER IMPLANT;  Surgeon: Inocencio Soyla Lunger, MD;  Location: [facility];  Service: Cardiovascular;  Laterality: N/A;   PACEMAKER INSERTION Left    TONSILLECTOMY     UPPER GASTROINTESTINAL ENDOSCOPY  VASECTOMY     VENTRAL HERNIA REPAIR  07/31/2012   Procedure: HERNIA REPAIR VENTRAL ADULT;  Surgeon: Morene ONEIDA Olives, MD;  Location: [facility];  Service: General;  Laterality: N/A;   Patient Active Problem List   Diagnosis Date Noted   Difficult intravenous access 06/25/2023   Generalized weakness 05/24/2022   Drug overdose, intentional, initial encounter (HCC) 05/24/2022   Chronic pain syndrome 05/24/2022   Chronic anemia    Chronic prostatitis 05/01/2022   Bladder calculi 05/01/2022   Dysuria    Pelvic floor dysfunction 04/27/2022   Crohn's disease of small intestine with other complication (HCC) 02/13/2022   Protein-calorie malnutrition, severe 01/05/2022   Pacemaker 01/03/2022   External hemorrhoid, thrombosed 05/27/2019   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   Long-term use of immunosuppressant medication   -Rinvoq  06/28/2017   Crohn's ileitis (HCC)  05/03/2017   Psoriasis 08/23/2016   Insomnia 04/22/2014   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/06/2012   SVT (supraventricular tachycardia) (HCC) 08/04/2012   Vitamin D  deficiency 06/18/2012   Attention deficit hyperactivity disorder (ADHD) 05/28/2009   BPH (benign prostatic hyperplasia) 07/24/2007   Asthma 03/06/2007   GERD 03/06/2007   DIVERTICULITIS, HX OF 03/06/2007    PCP: Johnny Garnette LABOR, MD   REFERRING PROVIDER: Avram Lupita BRAVO, MD   REFERRING DIAG: R19.8 (ICD-10-CM) - Abdominal weakness   THERAPY DIAG:  Muscle weakness (generalized)  Cramp and spasm  Generalized abdominal pain  Scar adherent  Abnormal posture  Pelvic pain  Rationale for Evaluation and Treatment: Rehabilitation  ONSET DATE: 2005  SUBJECTIVE:                                                                                                                                                                                           SUBJECTIVE STATEMENT: Patient reports he is doing fairly well today.   Had a good Christmas visiting with his grandson.  Pelvic pain persists.   Fluid intake: Yes: does not drink caffeine, water      PAIN:  Are you having pain? Yes: NPRS scale: 3/10 Pain location: abdomen Pain description: constant Aggravating factors: stress Relieving factors: treatment  PAIN:  Are you having pain? Yes NPRS scale: 4-8/10 Pain location: between anus and scrotum Pain type: dull and pressure,sharp Pain description: constant more at night  Aggravating factors: sitting, urinating, bowel movement,  Relieving factors: walking, moving  PRECAUTIONS: ICD/Pacemaker  RED FLAGS: None   WEIGHT BEARING RESTRICTIONS: No  FALLS:  Has patient fallen in last 6 months? Yes. Number of falls water  main cover was not put back properly and tripped on it and recovered so not due to balance  LIVING ENVIRONMENT: Lives with: lives with their  spouse   OCCUPATION: retired  PLOF: Independent  PATIENT GOALS: build endurance back, strength of abdominal wall, reduction of pelvic pain  PERTINENT HISTORY:  Chron's disease of ilium; short bowel syndrome; chronic posterior anal fissure;, small bowel anastomosis's resection x 2, mesh removal x2; ileostomy closure  BOWEL MOVEMENT: Pain with bowel movement: No Type of bowel movement:Type (Bristol Stool Scale) Type 7, Frequency 5, Strain No, and Splinting no  goes multiple times due to short bowel syndrome Fully empty rectum: Yes:   Leakage: Yes: sometimes Pads: Yes: diaper Fiber supplement: No he is not suppose to  URINATION: Pain with urination: Yes Fully empty bladder: Yes:   Stream: Strong Urgency: Yes: sometimes Frequency: every 2 hours Leakage:  none   INTERCOURSE:Not active     OBJECTIVE:  Note: Objective measures were completed at Evaluation unless otherwise noted.   COGNITION: Overall cognitive status: Within functional limits for tasks assessed       FUNCTIONAL TESTS:  6 minute walk test: exertion 5 for 1601 feet  GAIT: Distance walked: 6 minutes walked 1601 feet with exertion of 5 moderate Assistive device utilized: None Level of assistance: Complete Independence Comments: walks with decreased trunk extension, decreased hip extension  POSTURE: rounded shoulders, forward head, decreased lumbar lordosis, increased thoracic kyphosis, posterior pelvic tilt, and flexed trunk   PELVIC ALIGNMENT: ASIS is equal  LUMBARAROM/PROM:  A/PROM A/PROM  eval  Flexion full  Extension 100% limitation  Right lateral flexion Decreased by 25%  Left lateral flexion Decreased by 25%  Right rotation Decreased by 25%  Left rotation Decreased by 25%   (Blank rows = not tested)    LOWER EXTREMITY MMT:  MMT Right eval Left eval  Hip extension 3/5 3/5  Hip abduction 4/5 3+/5   PALPATION:   General  rib cage is restricted, multiple abdominal scars that are  restricted. Unable to tighten his stomach and will lift up his rib cage instead.                 External Perineal Exam ischiocavernosus, bulbocavernosus,                              Internal Pelvic Floor tenderness located in bilateral levator ani, obturator internist, coccygeus  Patient confirms identification and approves PT to assess internal pelvic floor and treatment Yes  PELVIC MMT:   MMT eval  Internal Anal Sphincter 1/5  External Anal Sphincter 1/5  Puborectalis 2/5  (Blank rows = not tested)        TONE: increased   TODAY'S TREATMENT:                                                                                                                              DATE: 07/17/23 Nustep x 5 min level 5 Seated lateral overhead reach x 5 each side holding 5 sec  each Seated bilateral overhead reach x 5 holding 5 sec each Seated trunk rotation x 5 each side holding 5 sec each Hooklying trunk rotation x 10 each side Supine hanging LE off edge of mat table x 10 each LE holding 5 sec each  Prone lying x 1 min Prone on elbows x 1 min Manual STM and incision mobilization to all adhesions.  Kinesiotaping to right abdominal adhesions (medial to lateral pull with midline anchor) x 12 min.  DATE: 06/20/23 Manual scar tissue mobility, abdominal massage, general soft tissue mobility K tape to abdominal scars   DATE: 06/20/23  Nustep x 5 min level 4 Seated upright posture with overhead lateral reach stretch x 10 Seated scapular retraction x 20 Seated green tband rows with PT anchoring  2 X 10 Seated bilateral ER x 20 with red band Seated bilateral shoulder horizontal abduction x 20 with red tband Supine hip flexor stretch (SKTC with manual overpressure) Hooklying trunk rotation x 5 each side Removed Ktape: decided to hold on re-applying due to slight tenderness  DATE: 05/18/23  EVAL see below   PATIENT EDUCATION:  Education details: lay on his stomach and add in prop on elbows to  elongate his anterior trunk.  Person educated: Patient Education method: Medical Illustrator Education comprehension: verbalized understanding and returned demonstration  HOME EXERCISE PROGRAM: See above.   ASSESSMENT:  CLINICAL IMPRESSION: Valere was able to tolerate more activity today.  He was able to remain in prone for 1 min and then transition to prone on elbows for 1 minute.  He feels the tape is helping.  He continues to have pelvic pain but takes oxycodone  each night to help him sleep.  He should continue to do well.       He has tenderness in the abdomen, levator ani, obturator internist, coccygeus, bulbocavernosus, and ischiocavernosus. External and internal anal sphincter strength is 1/5 and puborectalis is 2/5. Patient reports fecal leakage and will wear a diaper due to this. His stool is Type 7 and going 5 times per day due to short bowel syndrome. Patient will benefit from skilled therapy to improve overall endurance, posture, strength and reduce his pain.   OBJECTIVE IMPAIRMENTS: Abnormal gait, decreased activity tolerance, decreased coordination, decreased endurance, difficulty walking, decreased ROM, decreased strength, increased fascial restrictions, increased muscle spasms, impaired tone, postural dysfunction, and pain.   ACTIVITY LIMITATIONS: sitting, standing, continence, toileting, and locomotion level  PARTICIPATION LIMITATIONS: shopping and community activity  PERSONAL FACTORS: Fitness, Time since onset of injury/illness/exacerbation, and 3+ comorbidities: Chron's disease of ilium; short bowel syndrome; chronic posterior anal fissure;, small bowel anastomosis's resection x 2, mesh removal x2; ileostomy closure  are also affecting patient's functional outcome.   REHAB POTENTIAL: Excellent  CLINICAL DECISION MAKING: Evolving/moderate complexity  EVALUATION COMPLEXITY: Moderate   GOALS: Goals reviewed with patient? Yes  SHORT TERM GOALS: Target date:  06/15/23  Patient independent with initial HEP for strengthening and flexibility.  Baseline: Goal status: MET 07/17/23  2.  Patient understands how to perform scar massage to lengthen the tissue.  Baseline:  Goal status: MET 07/17/23  3.  Patient reports his abdominal pain decreased >/= 25 % due to reduction of restrictions.  Baseline:  Goal status: In Progress  4.  Patient is able to contract his abdominals with not lifting his rib cage.  Baseline:  Goal status: In Progress  5.  Patient has increased lumbar extension by 25% due to increased lumbar ROM and length of his abdominals.  Baseline:  Goal status:MET 07/17/23  6.  Patient able to do the 6 minute walk test with exertion </= 2/10.  Baseline:  Goal status: In progress  LONG TERM GOALS: Target date: 07/13/23  Patient independent with advanced HEP for strength, flexibility, and endurance.  Baseline:  Goal status: INITIAL  2.  Patient pelvic floor strength >/= 3/5 holding for 40 seconds so his fecal leakage decreased >/= 75%.  Baseline:  Goal status: INITIAL  3.  Patient is able to walk >/= 1729 feet in 6 minutes with 0-1 exertion due to increased strength and rib mobility.  Baseline:  Goal status: INITIAL  4.  Patient is able to stand upright due to increased length of the anterior trunk and lumbar extension improve by 50%.  Baseline:  Goal status: INITIAL  5.  Patient has reduction of pelvic pain decreased >/= 50% due to improve abdominal strength and elongation.  Baseline:  Goal status: INITIAL   PLAN:  PT FREQUENCY: 2x/week  PT DURATION: 8 weeks  PLANNED INTERVENTIONS: 97110-Therapeutic exercises, 97530- Therapeutic activity, [id number]- Neuromuscular re-education, 97140- Manual therapy, [id number]- Gait training, [id number]- Ultrasound, Patient/Family education, Balance training, Dry Needling, Joint manipulation, Spinal mobilization, Scar mobilization, Cryotherapy, Moist heat, and Biofeedback  PLAN FOR NEXT SESSION:  Recertifying for 8 more weeks.  We will do 1-2 times per week.  Continue manual work to abdomen, dry needling to scars if indicated, kinesiotape to scars for mobility, endurance training, hip and back extensor strength, nustep,  Pelvic floor: manual work to pelvic floor, abdominal work,  If MD signs the initial summary that means he agrees for physical therapist to work on the pelvic floor.    Delon B. Krrish Freund, PT 07/17/23 9:23 PM [facility] [address] [place], [place] [address] Phone # [phone] Fax [phone]

## 2023-07-20 ENCOUNTER — Encounter: Payer: Self-pay | Admitting: Family Medicine

## 2023-07-20 ENCOUNTER — Ambulatory Visit (INDEPENDENT_AMBULATORY_CARE_PROVIDER_SITE_OTHER): Payer: Medicare Other | Admitting: Family Medicine

## 2023-07-20 VITALS — BP 102/68 | HR 61 | Temp 98.4°F | Wt 156.2 lb

## 2023-07-20 DIAGNOSIS — M6289 Other specified disorders of muscle: Secondary | ICD-10-CM | POA: Diagnosis not present

## 2023-07-20 DIAGNOSIS — F1190 Opioid use, unspecified, uncomplicated: Secondary | ICD-10-CM | POA: Diagnosis not present

## 2023-07-20 MED ORDER — HYDROCODONE-ACETAMINOPHEN 5-325 MG PO TABS: 1.0000 | ORAL_TABLET | Freq: Three times a day (TID) | ORAL | 0 refills | Status: DC | PRN

## 2023-07-20 NOTE — Progress Notes (Signed)
   Subjective:    Patient ID: [name], male    DOB: 1950-09-12, 73 y.o.   MRN: [id number]  HPI Here for pain management. His pelvic pain has worsened slightly, so that he now needs to take a dose or two of the Norco during the day. He is scheduled to see Dr. Inocente Hausen on 07-31-23, who is a new partner in [facility] who specializes in Crohn's disease apparently. He sees Dr. Carolee in Urology, and he has started pelvic PT for [facility]. So far he has had only the first session.    Review of Systems  Constitutional: Negative.   Gastrointestinal:  Positive for abdominal pain.       Objective:   Physical Exam Constitutional:      Appearance: Normal appearance.  Neurological:     Mental Status: He is alert.           Assessment & Plan:  Pain management.  Indication for chronic opioid: pelvic pain Medication and dose: Norco 5-325 # pills per month: 90 Last UDS date: 05-25-23 Opioid Treatment Agreement signed (Y/N): 05-25-23 Opioid Treatment Agreement last reviewed with patient:  07-20-23 [facility] reviewed this encounter (include red flags): Yes We will increase the Norco to where he can take up to 3 pills a day as needed.  Garnette Olmsted, MD

## 2023-07-23 ENCOUNTER — Ambulatory Visit: Payer: Medicare Other | Admitting: Family Medicine

## 2023-07-24 NOTE — Addendum Note (Signed)
 Addended by: Mikey Kirschner B on: 07/24/2023 09:52 AM   Modules accepted: Orders

## 2023-07-25 ENCOUNTER — Ambulatory Visit: Payer: Medicare Other | Attending: Internal Medicine | Admitting: Physical Therapy

## 2023-07-25 ENCOUNTER — Encounter: Payer: Self-pay | Admitting: Physical Therapy

## 2023-07-25 DIAGNOSIS — G7102 Facioscapulohumeral muscular dystrophy: Secondary | ICD-10-CM | POA: Diagnosis not present

## 2023-07-25 DIAGNOSIS — R252 Cramp and spasm: Secondary | ICD-10-CM

## 2023-07-25 DIAGNOSIS — R293 Abnormal posture: Secondary | ICD-10-CM | POA: Diagnosis not present

## 2023-07-25 DIAGNOSIS — R1084 Generalized abdominal pain: Secondary | ICD-10-CM

## 2023-07-25 DIAGNOSIS — R102 Pelvic and perineal pain: Secondary | ICD-10-CM | POA: Insufficient documentation

## 2023-07-25 DIAGNOSIS — M6281 Muscle weakness (generalized): Secondary | ICD-10-CM | POA: Insufficient documentation

## 2023-07-25 DIAGNOSIS — R262 Difficulty in walking, not elsewhere classified: Secondary | ICD-10-CM | POA: Diagnosis not present

## 2023-07-25 DIAGNOSIS — L905 Scar conditions and fibrosis of skin: Secondary | ICD-10-CM

## 2023-07-25 NOTE — Therapy (Signed)
 OUTPATIENT PHYSICAL THERAPY MALE PELVIC TREATMENT   Patient Name: [name] MRN: [id number] DOB:September 26, 1950, 73 y.o., male Today's Date: 07/25/2023  END OF SESSION:  PT End of Session - 07/25/23 1446     Visit Number 6    Date for PT Re-Evaluation 09/07/23    Authorization Type medicare    Authorization - Visit Number 6    Authorization - Number of Visits 10    PT Start Time 1445    PT Stop Time 1525    PT Time Calculation (min) 40 min    Activity Tolerance Patient tolerated treatment well    Behavior During Therapy [facility] for tasks assessed/performed             Past Medical History:  Diagnosis Date   Acute prostatitis 07/24/2007   Qualifier: Diagnosis of  By: Johnny MD, Garnette A    Acute respiratory failure with hypoxia (HCC)    Allergy    mild   Arthritis    osteoarthritis   Aspiration pneumonia (HCC) 05/24/2022   Asthma    Blood transfusion without reported diagnosis    BPH (benign prostatic hypertrophy) with urinary obstruction    Crohn's ileitis (HCC) suspected 05/03/2017   Dilated aortic root (HCC)    noted on echo 08/2012   Diverticulitis of colon    Drug-induced acute pancreatitis without infection or necrosis - from 6 MP 09/24/2017   EPIDIDYMITIS 02/15/2010   Qualifier: Diagnosis of  By: Johnny MD, Garnette LABOR    GERD (gastroesophageal reflux disease)    H/O: GI bleed    Hemorrhoids    Hepatitis 1975   unknown type    HERPES SIMPLEX INFECTION 10/14/2007   Qualifier: Diagnosis of  By: Johnny MD, Garnette A    Hiatal hernia    Ileus following gastrointestinal surgery (HCC) 12/26/2011   Long term (current) use of systemic steroids 06/18/2018   Psoriasis    sees Dr. Donnice Sims at [facility].   Rectal fissure    Recurrent ventral incisional hernia 05/10/2012   Shingles    Short bowel syndrome    SVT (supraventricular tachycardia) (HCC)    Ulcer 08/21/2013   ileal   Past Surgical History:  Procedure Laterality Date   BOWEL RESECTION  12/19/2011    Procedure: SMALL BOWEL RESECTION;  Surgeon: Morene ONEIDA Olives, MD;  Location: [facility];  Service: General;  Laterality: N/A;  with anastamosis and insertion mesh   BRONCHOSCOPY     COLON SURGERY  01/2004   x 2   COLONOSCOPY W/ BIOPSIES  04/26/2017   per Dr. Avram, no polyps, benign inflammation, repeat in 5 yrs    CYSTOSCOPY     ESOPHAGOGASTRODUODENOSCOPY     HEMICOLECTOMY     left side, at [facility], diverticulitis   HEMORRHOID BANDING     HERNIA REPAIR     [phone] incisional hernia   ILEOSTOMY     ILEOSTOMY CLOSURE     INSERTION OF MESH  07/31/2012   Procedure: INSERTION OF MESH;  Surgeon: Morene ONEIDA Olives, MD;  Location: [facility];  Service: General;;   LAPAROTOMY  12/19/2011   Procedure: EXPLORATORY LAPAROTOMY;  Surgeon: Morene ONEIDA Olives, MD;  Location: [facility];  Service: General;  Laterality: N/A;   PACEMAKER IMPLANT N/A 12/01/2020   Procedure: PACEMAKER IMPLANT;  Surgeon: Inocencio Soyla Lunger, MD;  Location: [facility];  Service: Cardiovascular;  Laterality: N/A;   PACEMAKER INSERTION Left    TONSILLECTOMY     UPPER GASTROINTESTINAL ENDOSCOPY  VASECTOMY     VENTRAL HERNIA REPAIR  07/31/2012   Procedure: HERNIA REPAIR VENTRAL ADULT;  Surgeon: Morene ONEIDA Olives, MD;  Location: [facility];  Service: General;  Laterality: N/A;   Patient Active Problem List   Diagnosis Date Noted   Difficult intravenous access 06/25/2023   Generalized weakness 05/24/2022   Drug overdose, intentional, initial encounter (HCC) 05/24/2022   Chronic pain syndrome 05/24/2022   Chronic anemia    Chronic prostatitis 05/01/2022   Bladder calculi 05/01/2022   Dysuria    Pelvic floor dysfunction 04/27/2022   Crohn's disease of small intestine with other complication (HCC) 02/13/2022   Protein-calorie malnutrition, severe 01/05/2022   Pacemaker 01/03/2022   External hemorrhoid, thrombosed 05/27/2019   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   Long-term use of immunosuppressant medication   -Rinvoq  06/28/2017   Crohn's ileitis (HCC)  05/03/2017   Psoriasis 08/23/2016   Insomnia 04/22/2014   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/06/2012   SVT (supraventricular tachycardia) (HCC) 08/04/2012   Vitamin D  deficiency 06/18/2012   Attention deficit hyperactivity disorder (ADHD) 05/28/2009   BPH (benign prostatic hyperplasia) 07/24/2007   Asthma 03/06/2007   GERD 03/06/2007   DIVERTICULITIS, HX OF 03/06/2007    PCP: Johnny Garnette LABOR, MD   REFERRING PROVIDER: Avram Lupita BRAVO, MD   REFERRING DIAG: R19.8 (ICD-10-CM) - Abdominal weakness   THERAPY DIAG:  Muscle weakness (generalized)  Cramp and spasm  Generalized abdominal pain  Scar adherent  Abnormal posture  Pelvic pain  Difficulty in walking, not elsewhere classified  Rationale for Evaluation and Treatment: Rehabilitation  ONSET DATE: 2005  SUBJECTIVE:                                                                                                                                                                                           SUBJECTIVE STATEMENT: Pain has gotten worse. I saw Dr. Carolee and he did a cystoscope. I had a CT scan that was unremarkable. I have a hard time in the morning with pelvic pain especially when I wake up.    Fluid intake: Yes: does not drink caffeine, water      PAIN:  Are you having pain? Yes: NPRS scale: 6/10 Pain location: abdomen Pain description: constant Aggravating factors: stress Relieving factors: treatment  PAIN:  Are you having pain? Yes NPRS scale: 4-8/10 Pain location: between anus and scrotum Pain type: dull and pressure,sharp Pain description: constant more at night  Aggravating factors: sitting, urinating, bowel movement, waking up in the morning.  Relieving factors: walking, moving  PRECAUTIONS: ICD/Pacemaker  RED FLAGS: None   WEIGHT BEARING RESTRICTIONS: No  FALLS:  Has patient  fallen in last 6 months? Yes. Number of falls water  main cover was not  put back properly and tripped on it and recovered so not due to balance  LIVING ENVIRONMENT: Lives with: lives with their spouse   OCCUPATION: retired  PLOF: Independent  PATIENT GOALS: build endurance back, strength of abdominal wall, reduction of pelvic pain  PERTINENT HISTORY:  Chron's disease of ilium; short bowel syndrome; chronic posterior anal fissure;, small bowel anastomosis's resection x 2, mesh removal x2; ileostomy closure  BOWEL MOVEMENT: Pain with bowel movement: No Type of bowel movement:Type (Bristol Stool Scale) Type 7, Frequency 5, Strain No, and Splinting no  goes multiple times due to short bowel syndrome Fully empty rectum: Yes:   Leakage: Yes: sometimes Pads: Yes: diaper Fiber supplement: No he is not suppose to  URINATION: Pain with urination: Yes Fully empty bladder: Yes:   Stream: Strong Urgency: Yes: sometimes Frequency: every 2 hours Leakage:  none   INTERCOURSE:Not active     OBJECTIVE:  Note: Objective measures were completed at Evaluation unless otherwise noted.   COGNITION: Overall cognitive status: Within functional limits for tasks assessed       FUNCTIONAL TESTS:  6 minute walk test: exertion 5 for 1601 feet  GAIT: Distance walked: 6 minutes walked 1601 feet with exertion of 5 moderate Assistive device utilized: None Level of assistance: Complete Independence Comments: walks with decreased trunk extension, decreased hip extension  POSTURE: rounded shoulders, forward head, decreased lumbar lordosis, increased thoracic kyphosis, posterior pelvic tilt, and flexed trunk   PELVIC ALIGNMENT: ASIS is equal  LUMBARAROM/PROM:  A/PROM A/PROM  eval 07/25/23  Flexion full full  Extension 100% limitation Decreased by 75%  Right lateral flexion Decreased by 25% Decreased by 25%  Left lateral flexion Decreased by 25% Decreased by 25%  Right rotation Decreased by 25% Decreased by 25%  Left rotation Decreased by 25% Decreased by 25%    (Blank rows = not tested)    LOWER EXTREMITY MMT:  MMT Right eval Left eval Right 07/25/23 Left  07/25/23  Hip extension 3/5 3/5 4/5 4/5  Hip abduction 4/5 3+/5 4/5 4/5   PALPATION:   General  rib cage is restricted, multiple abdominal scars that are restricted. Unable to tighten his stomach and will lift up his rib cage instead.                 External Perineal Exam ischiocavernosus, bulbocavernosus,                              Internal Pelvic Floor tenderness located in bilateral levator ani, obturator internist, coccygeus  Patient confirms identification and approves PT to assess internal pelvic floor and treatment Yes  PELVIC MMT:   MMT eval  Internal Anal Sphincter 1/5  External Anal Sphincter 1/5  Puborectalis 2/5  (Blank rows = not tested)        TONE: increased   TODAY'S TREATMENT:       07/25/23 Manual: Soft tissue mobilization: Manual work to the perineal body, along the pubic rami, levator ani, obturator internist, and ischiocavernous, and bulbospongiosus to elongate  Educated patient on how to perform perineal massage at home to relax the muscles. Exercises: Stretches/mobility: Happy baby holding 1 minute supine Supine piriformis stretch holding 30 sec Supine trunk rotation pulling leg across body holding 30 sec bil.  Open book stretch in sidely holding 30 sec 2 times bil.  DATE: 07/17/23 Nustep x 5 min level 5 Seated lateral overhead reach x 5 each side holding 5 sec each Seated bilateral overhead reach x 5 holding 5 sec each Seated trunk rotation x 5 each side holding 5 sec each Hooklying trunk rotation x 10 each side Supine hanging LE off edge of mat table x 10 each LE holding 5 sec each  Prone lying x 1 min Prone on elbows x 1 min Manual STM and incision mobilization to all adhesions.  Kinesiotaping to right abdominal  adhesions (medial to lateral pull with midline anchor) x 12 min.  DATE: 06/20/23 Manual scar tissue mobility, abdominal massage, general soft tissue mobility K tape to abdominal scars   DATE: 06/20/23  Nustep x 5 min level 4 Seated upright posture with overhead lateral reach stretch x 10 Seated scapular retraction x 20 Seated green tband rows with PT anchoring  2 X 10 Seated bilateral ER x 20 with red band Seated bilateral shoulder horizontal abduction x 20 with red tband Supine hip flexor stretch (SKTC with manual overpressure) Hooklying trunk rotation x 5 each side Removed Ktape: decided to hold on re-applying due to slight tenderness   PATIENT EDUCATION: 07/25/23 Education details: Access Code: [email or web address], educated patient on perineal massage and sent you tube video Person educated: Patient Education method: Explanation, Demonstration, Tactile cues, Verbal cues, and Handouts Education comprehension: verbalized understanding, returned demonstration, verbal cues required, tactile cues required, and needs further education    HOME EXERCISE PROGRAM: 07/25/23 Access Code: [email or web address] URL: [email or web address] Date: 07/25/2023 Prepared by: Channing Pereyra  Exercises - Supine Figure 4 Piriformis Stretch  - 1 x daily - 7 x weekly - 1 sets - 2 reps - 30 sec hold - Supine Piriformis Stretch with Leg Straight  - 1 x daily - 7 x weekly - 1 sets - 2 reps - 30 sec hold - Sidelying Thoracic Rotation with Open Book  - 1 x daily - 7 x weekly - 1 sets - 2 reps - 30 sec hold    ASSESSMENT:  CLINICAL IMPRESSION: Pain level decreased to 2/10 after the manual work. He had several trigger points in the pelvic floor muscles. He understands how to massage the muscles. Patient has increased lumbar extension by 25% due to lengthened trunk tissue. Patient will benefit from skilled therapy to improve overall endurance, posture, strength and reduce his pain.   OBJECTIVE IMPAIRMENTS: Abnormal gait,  decreased activity tolerance, decreased coordination, decreased endurance, difficulty walking, decreased ROM, decreased strength, increased fascial restrictions, increased muscle spasms, impaired tone, postural dysfunction, and pain.   ACTIVITY LIMITATIONS: sitting, standing, continence, toileting, and locomotion level  PARTICIPATION LIMITATIONS: shopping and community activity  PERSONAL FACTORS: Fitness, Time since onset of injury/illness/exacerbation, and 3+ comorbidities: Chron's disease of ilium; short bowel syndrome; chronic posterior anal fissure;, small bowel anastomosis's resection x 2, mesh removal x2; ileostomy closure  are also affecting patient's functional outcome.   REHAB POTENTIAL: Excellent  CLINICAL DECISION MAKING: Evolving/moderate complexity  EVALUATION COMPLEXITY: Moderate   GOALS: Goals reviewed with patient? Yes  SHORT TERM GOALS: Target date: 06/15/23  Patient independent with initial HEP for strengthening and flexibility.  Baseline: Goal status: MET 07/17/23  2.  Patient understands how to perform scar massage to lengthen the tissue.  Baseline:  Goal status: MET 07/17/23  3.  Patient reports his abdominal pain decreased >/= 25 % due to reduction of restrictions.  Baseline:  Goal status: In Progress  4.  Patient is able to contract his abdominals with  not lifting his rib cage.  Baseline:  Goal status: In Progress  5.  Patient has increased lumbar extension by 25% due to increased lumbar ROM and length of his abdominals.  Baseline:  Goal status:MET 07/17/23  6.  Patient able to do the 6 minute walk test with exertion </= 2/10.  Baseline:  Goal status: In progress  LONG TERM GOALS: Target date: 07/13/23  Patient independent with advanced HEP for strength, flexibility, and endurance.  Baseline:  Goal status: INITIAL  2.  Patient pelvic floor strength >/= 3/5 holding for 40 seconds so his fecal leakage decreased >/= 75%.  Baseline:  Goal status:  INITIAL  3.  Patient is able to walk >/= 1729 feet in 6 minutes with 0-1 exertion due to increased strength and rib mobility.  Baseline:  Goal status: INITIAL  4.  Patient is able to stand upright due to increased length of the anterior trunk and lumbar extension improve by 50%.  Baseline:  Goal status: INITIAL  5.  Patient has reduction of pelvic pain decreased >/= 50% due to improve abdominal strength and elongation.  Baseline:  Goal status: INITIAL   PLAN:  PT FREQUENCY: 2x/week  PT DURATION: 8 weeks  PLANNED INTERVENTIONS: 97110-Therapeutic exercises, 97530- Therapeutic activity, [id number]- Neuromuscular re-education, 97140- Manual therapy, [id number]- Gait training, [id number]- Ultrasound, Patient/Family education, Balance training, Dry Needling, Joint manipulation, Spinal mobilization, Scar mobilization, Cryotherapy, Moist heat, and Biofeedback  PLAN FOR NEXT SESSION:  We will do 1-2 times per week.  Continue manual work to abdomen, dry needling to scars if indicated, kinesiotape to scars for mobility, endurance training, hip and back extensor strength, nustep,  Pelvic floor: manual work to pelvic floor, abdominal work, 6 minute walk test, diaphragmatic breathing     Channing Pereyra, PT 07/25/23 3:32 PM

## 2023-07-31 ENCOUNTER — Ambulatory Visit: Payer: Medicare Other | Admitting: Pediatrics

## 2023-07-31 ENCOUNTER — Encounter: Payer: Self-pay | Admitting: Pediatrics

## 2023-07-31 VITALS — BP 106/70 | HR 87 | Ht 70.0 in | Wt 159.0 lb

## 2023-07-31 DIAGNOSIS — E569 Vitamin deficiency, unspecified: Secondary | ICD-10-CM

## 2023-07-31 DIAGNOSIS — E781 Pure hyperglyceridemia: Secondary | ICD-10-CM

## 2023-07-31 DIAGNOSIS — R141 Gas pain: Secondary | ICD-10-CM

## 2023-07-31 DIAGNOSIS — R634 Abnormal weight loss: Secondary | ICD-10-CM

## 2023-07-31 DIAGNOSIS — Z7960 Long term (current) use of unspecified immunomodulators and immunosuppressants: Secondary | ICD-10-CM

## 2023-07-31 DIAGNOSIS — K50018 Crohn's disease of small intestine with other complication: Secondary | ICD-10-CM

## 2023-07-31 DIAGNOSIS — R197 Diarrhea, unspecified: Secondary | ICD-10-CM

## 2023-07-31 DIAGNOSIS — K219 Gastro-esophageal reflux disease without esophagitis: Secondary | ICD-10-CM

## 2023-07-31 NOTE — Patient Instructions (Signed)
 A referral was place for Nutritionist they will contact you to scheduled an appointment.  Follow up as needed with Dr Avram  _______________________________________________________  If your blood pressure at your visit was 140/90 or greater, please contact your primary care physician to follow up on this.  _______________________________________________________  If you are age 71 or older, your body mass index should be between 23-30. Your Body mass index is 22.81 kg/m. If this is out of the aforementioned range listed, please consider follow up with your Primary Care Provider.  If you are age 30 or younger, your body mass index should be between 19-25. Your Body mass index is 22.81 kg/m. If this is out of the aformentioned range listed, please consider follow up with your Primary Care Provider.   ________________________________________________________  The [facility] providers would like to encourage you to use MYCHART to communicate with providers for non-urgent requests or questions.  Due to long hold times on the telephone, sending your provider a message by [facility] may be a faster and more efficient way to get a response.  Please allow 48 business hours for a response.  Please remember that this is for non-urgent requests.  _______________________________________________________  Thank you for entrusting me with your care and choosing [facility].  Dr Suzann

## 2023-07-31 NOTE — Progress Notes (Signed)
 [facility] Return Visit   Referring Provider Johnny Garnette LABOR, MD [address] [place],  [place] [address]  Primary Care Provider Johnny Garnette LABOR, MD  Patient Profile: [name] is a 73 y.o. male with a past medical history noteworthy for SVT, psoriasis, dilated aortic root, BPH, abdominal wall hernia status post mesh repair, left hemicolectomy for diverticulitis complicated by leak requiring ileostomy 2005 who returns to the [facility] for follow-up of the problem(s) noted below.  Problem List: Small bowel Crohn's disease diagnosed 2018 status post 47 cm SBR 10/2021 complicated by EC fistula, high output ileostomy, dehydration (need for TPN); ileostomy takedown 08/2022 Pancreatitis secondary to 6-MP History of diverticulitis status post left hemicolectomy complicated by leak requiring ileostomy 2005 History of incarcerated hernia status post ex lap and SBR 2013 History of anemia History of anal fissure Diarrhea Hypertriglyceridemia GERD and history of esophageal stricture status post dilation in remote past   History of Present Illness   [name] was last seen in the GI office by Dr. Avram 06/25/2023.  I am seeing him today at Dr. Darilyn request for a second opinion regarding recent symptoms of diarrhea and management of Crohn's disease   Current GI Meds  Rinvoq  15 mg po daily Pantoprazole  40 mg orally daily Simethicone  80 mg p.o. 4 times daily Vitamin D  2000 international units daily Thiamine  100 mg p.o. daily Ferrous sulfate  325 mg p.o. daily Vitamin C MVI Ondansetron  4 mg p.o. every 6 hours as needed for nausea and vomiting Hyoscyamine  0.125 mg every 6 hours as needed for cramping  Interval History  [name] presents to the office today unaccompanied.  In speaking with Mr. Froning as well as reviewing his prior medical records, his IBD history is noteworthy for the following:  2018 - Dx'd with SB Crohn's disease  (TI inflammation) tx'd w/ budesonide  + 6-MP 2019 - Initiated Humira  dose optimized to q. 7 days; 6-MP discontinued 2/2 pancreatitis 2020 - Trial of Stelara  (unresponsive) and subsequently Entyvio  with improvement 2022 - Perineal/abdominal pain with active inflammation on CT 10/2021 [facility]) - Ex lap, LOA, mesh removal, ileocolic resection, end ileostomy -managed on Entyvio  postop 02/2022 [facility]) - EC fistula takedown c/b high output ileostomy and FTT requiring TPN 08/2022 ([facility]) -Ileostomy takedown 12/2022 - Induction Rinvoq  15 mg po daily  At today's visit, Mr. Amrhein primary complaint complaints  frequent, loose, watery diarrhea and difficulty gaining weight He is currently being maintained on Rinvoq  15 mg orally daily   Reports he is having up to 10, loose, nonbloody bowel movements a day Endorses associated fecal urgency but no tenesmus, incontinence or nocturnal bowel movements Notes intermittent abdominal cramping that he associates with gas -uses Gas-X, has improved since utilizing Xifaxan  for possible SIBO Denies current perianal symptoms -anal fissure stable -Vesely seen at [facility] with recommendation for nifedipine topical treatment  Fecal calprotectin trend over past year: 275 --> 132 --> 59 --> 73  Denies extraintestinal manifestations of IBD including fevers, chills, joint pain, skin rashes or oral ulcers  Has a history of GERD currently controlled on pantoprazole  40 mg orally daily Has mild nausea but no vomiting-takes Zofran  with benefit  Weight today is 159 pounds -he identifies his healthy/premorbid weight as 165 pounds Weight has steadily improved-previous weights in spring 2024 reached a nadir of 143 pounds  Appetite waxes and wanes but has improved compared to prior In terms of his diet, he notes a dairy sensitivity Currently consuming a variety  of foods: Breakfast-sausage, toast, breakfast sandwich, orange juice Lunch-sandwich Dinner-servings of  meat, starch or vegetable Drinks water , orange juice and small amounts of ginger ale   Last colonoscopy: 09/2021 -inflammation characterized by small ulcers in the terminal ileum, diverticulosis Last endoscopy: 09/2002 -esophageal stricture dilated to 17 mm  Last Abd CT/CTE/MRE: 05/2023 -equivocal thickening of the neo-TI and distal colon which could represent underdistention   Inflammatory Bowel Disease History  2018 - Dx'd with SB Crohn's disease (TI inflammation) tx'd w/ budesonide  + 6-MP 2019 - Initiated Humira  dose optimized to q. 7 days; 6-MP discontinued 2/2 pancreatitis 2020 - Trial of Stelara  (unresponsive) and subsequently Entyvio  with improvement 2022 - Perineal/abdominal pain with active inflammation on CT 10/2021 [facility]) - Ex lap, LOA, mesh removal, ileocolic resection, end ileostomy -managed on Entyvio  postop 02/2022 [facility]) - EC fistula takedown c/b high output ileostomy and FTT requiring TPN 08/2022 ([facility]) -Ileostomy takedown 12/2022 - Induction Rinvoq  15 mg po daily  IBD Medication History Budesonide , prednisone  -reports some response 6-MP-ineffective as monotherapy, developed pancreatitis Humira  -required dose optimization to weekly dosing and was ineffective Stelara -ineffective Entyvio -ineffective Rinvoq  - Induction 12/2022  GI Review of Symptoms Significant for diarrhea, gas, abdominal cramping. Otherwise negative.  General Review of Systems  Review of systems is significant for the pertinent positives and negatives as listed per the HPI.  Full ROS is otherwise negative.  Past Medical History   Past Medical History:  Diagnosis Date   Acute prostatitis 07/24/2007   Qualifier: Diagnosis of  By: Johnny MD, Garnette A    Acute respiratory failure with hypoxia (HCC)    Allergy    mild   Arthritis    osteoarthritis   Aspiration pneumonia (HCC) 05/24/2022   Asthma    Blood transfusion without reported diagnosis    BPH (benign prostatic hypertrophy)  with urinary obstruction    Crohn's ileitis (HCC) suspected 05/03/2017   Dilated aortic root (HCC)    noted on echo 08/2012   Diverticulitis of colon    Drug-induced acute pancreatitis without infection or necrosis - from 6 MP 09/24/2017   EPIDIDYMITIS 02/15/2010   Qualifier: Diagnosis of  By: Johnny MD, Garnette LABOR    GERD (gastroesophageal reflux disease)    H/O: GI bleed    Hemorrhoids    Hepatitis 1975   unknown type    HERPES SIMPLEX INFECTION 10/14/2007   Qualifier: Diagnosis of  By: Johnny MD, Garnette A    Hiatal hernia    Ileus following gastrointestinal surgery (HCC) 12/26/2011   Long term (current) use of systemic steroids 06/18/2018   Psoriasis    sees Dr. Donnice Sims at [facility].   Rectal fissure    Recurrent ventral incisional hernia 05/10/2012   Shingles    Short bowel syndrome    SVT (supraventricular tachycardia) (HCC)    Ulcer 08/21/2013   ileal     Past Surgical History   Past Surgical History:  Procedure Laterality Date   BOWEL RESECTION  12/19/2011   Procedure: SMALL BOWEL RESECTION;  Surgeon: Morene ONEIDA Olives, MD;  Location: [facility]S;  Service: General;  Laterality: N/A;  with anastamosis and insertion mesh   BRONCHOSCOPY     COLON SURGERY  01/2004   x 2   COLONOSCOPY W/ BIOPSIES  04/26/2017   per Dr. Avram, no polyps, benign inflammation, repeat in 5 yrs    CYSTOSCOPY     ESOPHAGOGASTRODUODENOSCOPY     HEMICOLECTOMY     left side, at [facility], diverticulitis  HEMORRHOID BANDING     HERNIA REPAIR     3'7986 incisional hernia   ILEOSTOMY     ILEOSTOMY CLOSURE     INSERTION OF MESH  07/31/2012   Procedure: INSERTION OF MESH;  Surgeon: Morene ONEIDA Olives, MD;  Location: [facility];  Service: General;;   LAPAROTOMY  12/19/2011   Procedure: EXPLORATORY LAPAROTOMY;  Surgeon: Morene ONEIDA Olives, MD;  Location: [facility];  Service: General;  Laterality: N/A;   PACEMAKER IMPLANT N/A 12/01/2020   Procedure: PACEMAKER IMPLANT;  Surgeon: Inocencio Soyla Lunger,  MD;  Location: [facility] INVASIVE CV [facility];  Service: Cardiovascular;  Laterality: N/A;   PACEMAKER INSERTION Left    TONSILLECTOMY     UPPER GASTROINTESTINAL ENDOSCOPY     VASECTOMY     VENTRAL HERNIA REPAIR  07/31/2012   Procedure: HERNIA REPAIR VENTRAL ADULT;  Surgeon: Morene ONEIDA Olives, MD;  Location: [facility];  Service: General;  Laterality: N/A;     Allergies and Medications   Allergies  Allergen Reactions   Purinethol  [Mercaptopurine ] Other (See Comments)    Pancreatitis    Shellfish Allergy Anaphylaxis and Swelling    Can use standard SMOF lipid formulation for TPN without any issue.    Humira  [Adalimumab ] Other (See Comments)    Developed antibodies   Tape Rash   Wound Dressing Adhesive Rash    Current Meds  Medication Sig   acetaminophen  (TYLENOL ) 325 MG tablet Take 650 mg by mouth every 6 (six) hours as needed for moderate pain.   albuterol  (VENTOLIN  HFA) 108 (90 Base) MCG/ACT inhaler Inhale 2 puffs into the lungs every 6 (six) hours as needed for wheezing or shortness of breath.   AMBULATORY NON FORMULARY MEDICATION Medication Name: nifedipine 0.2%/lidocaine  5% ointment 1:1 mix Apply 3-4 tmes/day into anal canal   busPIRone  (BUSPAR ) 10 MG tablet Take 10 mg by mouth every morning.   calcium  carbonate (TUMS - DOSED IN MG ELEMENTAL CALCIUM ) 500 MG chewable tablet Chew 500 mg by mouth 3 (three) times daily as needed for indigestion or heartburn.   Cholecalciferol (VITAMIN D ) 50 MCG (2000 UT) tablet Take 1 tablet (2,000 Units total) by mouth daily.   diphenoxylate -atropine  (LOMOTIL ) 2.5-0.025 MG tablet Take 2 tablets by mouth 4 (four) times daily.   ferrous sulfate  325 (65 FE) MG tablet Take 1 tablet (325 mg total) by mouth daily with breakfast.   finasteride  (PROSCAR ) 5 MG tablet Take 5 mg by mouth daily.   gabapentin  (NEURONTIN ) 100 MG capsule Take 1 capsule (100 mg total) by mouth 3 (three) times daily.   HYDROcodone -acetaminophen  (NORCO) 5-325 MG tablet Take 1 tablet by mouth  every 8 (eight) hours as needed for moderate pain (pain score 4-6).   HYDROcodone -acetaminophen  (NORCO) 5-325 MG tablet Take 1 tablet by mouth every 8 (eight) hours as needed for moderate pain (pain score 4-6).   HYDROcodone -acetaminophen  (NORCO) 5-325 MG tablet Take 1 tablet by mouth every 8 (eight) hours as needed for moderate pain (pain score 4-6).   hyoscyamine  (LEVSIN  SL) 0.125 MG SL tablet Place 0.125 mg under the tongue every 6 (six) hours as needed for cramping.   magnesium  oxide (MAG-OX) 400 MG tablet Take 1 tablet (400 mg total) by mouth 2 (two) times daily.   melatonin 3 MG TABS tablet Take 3 mg by mouth at bedtime.   Multiple Vitamin (MULTIVITAMIN WITH MINERALS) TABS tablet Take 1 tablet by mouth daily.   ondansetron  (ZOFRAN -ODT) 4 MG disintegrating tablet Dissolve 1 tablet (4 mg total) by mouth every  6 (six) hours as needed for nausea or vomiting.   pantoprazole  (PROTONIX ) 40 MG tablet Take 40 mg by mouth daily.   phenazopyridine  (PYRIDIUM ) 200 MG tablet Take 1 tablet (200 mg total) by mouth 3 (three) times daily as needed (urinary pain).   Potassium Chloride  ER 20 MEQ TBCR TAKE 1 TABLET(20 MEQ) BY MOUTH TWICE DAILY   rifaximin  (XIFAXAN ) 550 MG TABS tablet Take 1 tablet (550 mg total) by mouth 3 (three) times daily.   simethicone  (MYLICON) 80 MG chewable tablet Chew 80 mg by mouth 4 (four) times daily -  with meals and at bedtime.   thiamine  (VITAMIN B1) 100 MG tablet Take 1 tablet (100 mg total) by mouth daily.   traZODone  (DESYREL ) 100 MG tablet Take 200 mg by mouth at bedtime.   Upadacitinib  ER (RINVOQ ) 15 MG TB24 Take 15 mg by mouth daily.   valACYclovir  (VALTREX ) 1000 MG tablet Take one tablet 3 times a day for 7 days as needed for shingles flares     Family History   Family History  Problem Relation Age of Onset   Lung cancer Mother    Leukemia Father    Hypertension Father    Heart disease Father    Heart attack Father    Prostate cancer Father    Prostate cancer  Paternal Uncle    Colon cancer Neg Hx    Stomach cancer Neg Hx    Colon polyps Neg Hx    Esophageal cancer Neg Hx    Rectal cancer Neg Hx     Social History   Social History   Tobacco Use   Smoking status: Never   Smokeless tobacco: Never  Vaping Use   Vaping status: Never Used  Substance Use Topics   Alcohol use: Not Currently    Comment: occ   Drug use: Yes    Types: Oxycodone    Elmore reports that he has never smoked. He has never used smokeless tobacco. He reports that he does not currently use alcohol. He reports current drug use. Drug: Oxycodone .  Vital Signs and Physical Examination   Vitals:   07/31/23 1539  BP: 106/70  Pulse: 87  Height: 5' 10 (1.778 m)  Weight: 159 lb (72.1 kg)  BMI (Calculated): 22.81     General: Thin, chronically ill-appearing, no acute distress Head: Normocephalic and atraumatic Eyes: Sclerae anicteric, EOMI Ears: Normal auditory acuity Mouth: No deformities or lesions noted Lungs: Clear throughout to auscultation Heart: Regular rate and rhythm; No murmurs, rubs or bruits Abdomen: Soft, mild newness to palpation over the right abdomen without rebound or guarding, multiple well-healed abdominal scars Rectal: Deferred Musculoskeletal: Symmetrical with no gross deformities  Pulses:  Normal pulses noted Extremities: Thin, no edema or deformities noted Neurological: Alert oriented x 4, grossly nonfocal Psychological:  Alert and cooperative. Normal mood and affect   Review of Data  The following data was reviewed at the time of this encounter:  Laboratory Studies      Latest Ref Rng & Units 06/26/2023    2:26 PM 02/12/2023    3:39 PM 10/04/2022   12:16 PM  CBC  WBC 4.0 - 10.5 K/uL 6.1  5.4  7.1   Hemoglobin 13.0 - 17.0 g/dL 87.0  86.3  85.1   Hematocrit 39.0 - 52.0 % 39.0  41.1  44.5   Platelets 150.0 - 400.0 K/uL 143.0  160.0  158.0     Lab Results  Component Value Date   LIPASE 34 05/12/2022  Latest Ref Rng & Units  06/26/2023    2:26 PM 04/30/2023   10:13 AM 02/12/2023    3:39 PM  CMP  Glucose 70 - 99 mg/dL 80  85  87   BUN 6 - 23 mg/dL 12  14  13    Creatinine 0.40 - 1.50 mg/dL 8.87  8.85  8.98   Sodium 135 - 145 mEq/L 139  139  140   Potassium 3.5 - 5.1 mEq/L 4.6  3.6  4.1   Chloride 96 - 112 mEq/L 102  103  102   CO2 19 - 32 mEq/L 31  30  30    Calcium  8.4 - 10.5 mg/dL 9.4  8.9  8.7   Total Protein 6.0 - 8.3 g/dL 6.4  6.3  6.1   Total Bilirubin 0.2 - 1.2 mg/dL 0.9  0.6  0.5   Alkaline Phos 39 - 117 U/L 45  50  67   AST 0 - 37 U/L 36  46  34   ALT 0 - 53 U/L 38  34  23    Lab Results  Component Value Date   IRON 24 (L) 06/15/2022   TIBC 376 06/15/2022   FERRITIN 109.0 06/26/2023   04/2023 Vitamin B1 12  09/2022   Vitamin D  14.4    IBD Labs  Prebiologic Labs 11/2022 Quantiferon gold negative  2018 Hepatitis B surface antigen negative HBV surface antibody negative HBV core antibody nonreactive  Therapeutic Drug Monitoring  Thiopurine metabolite levels:  Date:                6-TGN       6-MMP  Biologic level and antibodies:  Fecal Calprotectin  08/2021   697 10/2022   275 01/2023   132 04/2023     59 06/2023    73  Surgical Pathology Specimens from Surgeries  Surgical Pathology 10/2021   A. SMALL BOWEL ANASTOMOSIS, RESECTION:  - Anastomotic site, present.  - Small bowel mucosa with no significant abnormalities.  - Serosa with fibrous adhesions and histiocytic giant cell reaction to mesh material.  B. MESH, REMOVAL:  - Mesh with attached adipose and membranous tissue, gross examination only.  C. SMALL BOWEL ANASTOMOSIS #2, RESECTION:  - Chronic active ileitis with pseudopolyp formation and mucosa ulceration. - Anastomotic site identified. - See comment.  D. MESH #2 , REMOVAL:  - Mesh with attached adipose tissue, gross examination only.   Surgical Pathology 02/2022   A. TERMINAL ILEUM, SEGMENTAL RESECTION:  - One segment of small bowel with a transmural  defect and ischemic changes, focally extending to the resection margins.  - Largest segment of small bowel with submucosal fibrosis, tattoo pigment, and vascular congestion.  - Serosa of both segments with abundant fibrinopurulent exudates and fibrous adhesions. - Histiocytic giant cell reaction to mesh and suture material present in both segments of bowel.   Surgical Pathology 10/2022 Small intestine (ileostomy trim): -Benign skin small intestinal tissue consistent with ostomy site.   Imaging Studies  CTAP 06/16/2023 1. Postoperative changes from multiple small bowel and colonic resections with right lower quadrant ileocolonic anastomosis. 2. Equivocal wall thickening involving the neo-terminal ileum. There is also mild wall thickening involving the partially decompressed distal colon. Findings are nonspecific but may reflect mild active inflammatory bowel disease. 3. No signs of active penetrating disease, bowel obstruction or abscess. 4.  Aortic Atherosclerosis (ICD10-I70.0).  CTE 11/08/2022 1. Interval ileostomy reversal and ileocolic anastomosis. 2. Multiple distal small bowel and colon resections with the  remaining portions of the colon situated in the right hemiabdomen. 3. Mild fat stranding about the ileocolic anastomosis and adjacent descending portion of the colon in the right lower quadrant, similar to prior examination. This may be postoperative or reflect ongoing inflammation. 4. No other inflammatory findings of the bowel. No evidence of stricture, obstruction, fistula, or abscess at this time.  CTAP 06/05/2022 Postoperative changes from partial colectomy and right lower quadrant ileostomy. Surrounding inflammatory change in the right lower quadrant is not significantly changed since 05/12/2022.   Patchy opacity in the right lung base, new since prior study. This could reflect developing pneumonia.  CT Chest/Abd/Pelvis 05/12/2022 1. Overall, no significant  interval change since the CT chest/abdomen/pelvis from 04/29/2022. 2. Stable inflammatory change in the right lower quadrant in the region of the anastomosis and diverting ileostomy. No evidence of bowel obstruction or other complicating feature at the anastomotic site or ostomy. 3. Unchanged small stones layering dependently in the bladder. 4. No acute pathology in the chest.    GI Procedures and Studies  Colonoscopy 09/20/2021 Inflammation characterized by shallow ulcerations in the distal ileum Scattered diverticula in the right colon  Colonoscopy 04/26/2017 Patchy inflammation with serpentine lesions and shallow ulcerations in the TI Scattered diverticuli in right colon Path: TI-ulceration, colon biopsies-normal  Colonoscopy 08/21/2013 Small ulcers in the TI Mild diverticulosis in left colon Internal/external hemorrhoids Path: Active ileitis with focal erosion  EGD/colonoscopy 09/29/2002 EGD - esophageal stricture dilated to 17 mm with Maloney dilator, prolapsing HH Colonoscopy -diverticuli in descending colon, sigmoid colon, otherwise normal   Clinical Impression  It is my clinical impression that Mr. Pullin is a 73 y.o. male with;  Small bowel Crohn's disease diagnosed 2018 status post 47 cm SBR 10/2021 complicated by EC fistula, high output ileostomy, dehydration (need for TPN); ileostomy takedown 08/2022 Pancreatitis secondary to 6-MP History of diverticulitis status post left hemicolectomy complicated by leak requiring ileostomy 2005 History of incarcerated hernia status post ex lap and SBR 2013 History of anemia History of anal fissure Diarrhea Hypertriglyceridemia GERD and history of esophageal stricture status post dilation in remote past  Mr. Brodrick was diagnosed with small bowel Crohn's disease in 2018 after colonoscopy disclosed inflammatory changes in the terminal ileum.  He was initially treated with budesonide  + 6-MP without benefit and rapidly escalated to  biologic treatment with Humira  junction with 6-MP.  Records indicate he required dose optimization to weekly dosing of Humira  and 6-MP was discontinued secondary to the evolution of pancreatitis.  In 2020 he was treated with Stelara  but did not find the medication effective.  Later that year he was transitioned to Entyvio  which he felt initially yielded improvement but ultimately experienced a waning of effect by 2022.  He was subsequently referred to [facility] consideration of small bowel surgical resection given the refractory nature of his small bowel disease.  It is noteworthy that he had a previous complex surgical history including left hemicolectomy for diverticular disease c/b leak requiring temporary ileostomy (2005) and incarcerated hernia status post ex lap and SBR (2013).  Mr. Brunty underwent initial surgery for his Crohn's disease 10/2021 involving ex lap, LOA, mesh removal, ileocolic resection and end ileostomy.  He subsequently developed an EC fistula and underwent a second surgery 02/2022 for fistula takedown.  His course was later complicated by high output ileostomy, failure to thrive and need for TPN.  He ultimately had successful ileostomy takedown 08/2022 at [facility].  During his postoperative course he was treated with Entyvio  and most  recently transitioned to Jak inhibitor therapy 12/2022 with Rinvoq  15 mg po daily.  Mr. Korber has made great strides over the last year with respect to general improvement in his Crohn's disease and nutritional restitution.  He continues to be troubled by loose, watery diarrhea as well as excess gas and bloating.  Objective testing in fall 2024 has shown reassuring findings on CT imaging without significantly active inflammation and most recent fecal calprotectin 79.  I discussed with Mr. Baumgartner that I think it is less likely his current symptoms reflect active Crohn's disease and concur with Dr. Avram that more likely etiologies include SIBO +/- bile acid  diarrhea secondary to his extensive small bowel resection and multiple surgeries.  He has noted benefit from Xifaxan  but is unsure regarding Medicare coverage of the medication in 2025.  He is interested in initiation of a bile acid sequestrant to see if this may better manage his diarrhea.  At this juncture, he is tolerating Rinvoq  well but has been noted to have elevated triglycerides.  Advise monitoring this and if remain persistently elevated engaging PCP regarding treatment.   Plan  Continue Rinvoq  15 mg po daily -if Crohn's disease becomes active can consider dose titration to 30 mg daily Complete course of Xifaxan  for possible SIBO -May required cycled courses of antibiotics if SIBO is an ongoing issue.  If Xifaxan  is cost prohibitive could consider metronidazole , Augmentin , doxycycline , Bactrim , etc. Recommend trial of bile acid sequestrant - Colestid , cholestyramine  or WelChol at Dr. Darilyn discretion.  Counseled Mr. Riviere that high doses are sometimes required for management of bile acid diarrhea\ If diarrhea persist despite the use of antibiotics and bile acid sequestrant's can consider reintroduction of antidiarrheals in the form of Imodium  and Lomotil  as well as scheduled anticholinergics. Long-term immune suppressant monitoring labs every 4 to 6 months: CBC, CMP, ESR, CRP; triglycerides at least every 6 months on jacketed for therapy Ongoing periodic monitoring of potential nutrient deficiencies and Crohn's patient status post small bowel resection: Iron studies, vitamin B12, folate, vitamin D  Continue periodic disease activity assessment with fecal calprotectin Recommend restaging colonoscopy in 2025 for assessment of ileocolic anastomosis/Rutgeert's scoring for prognostication of treatment course Monitor hypertriglyceridemia and engage PCP if remains persistently elevated Continue current vitamins: MVI, thiamine , iron, vitamin D  Continue pantoprazole  40 mg orally daily Continue  hyoscyamine  and simethicone  as needed Monitor weight and anthropometrics   IBD Health Maintenance  Vaccinations Influenza: Pneumococcal: COVID19: HAV:  HBV: HBV Ab non-reactive (2018) - vaccinate if not done Shingles: Ensure vaccination on checking after therapy HPV:  DEXA Consider DEXA given history of malnutrition, low vitamin D   Eye Exam PRN   Skin Exam Recommend annual skin exam on immune suppression  Surveillance Colonoscopy Last 09/2021 - no hx of colonic disease -does not require intensive colonic surveillance; as above consider restaging colonoscopy for disease activity assessment 2025  Tobacco Use   Depression Screen History of depression and suicidality   Planned Follow Up Mr. Santori will return to Dr. Darilyn care.  I would be happy to collaborate with my colleague in the future should additional questions or concerns arise.  The patient or caregiver verbalized understanding of the material covered, with no barriers to understanding. All questions were answered. Patient or caregiver is agreeable with the plan outlined above.    It was a pleasure to see Norleen.  If you have any questions or concerns regarding this evaluation, do not hesitate to contact me.  Inocente Hausen, MD [facility]  I spent total of 45 minutes in both face-to-face and non-face-to-face activities, excluding procedures performed, for the visit on the date of this encounter.

## 2023-08-01 ENCOUNTER — Ambulatory Visit: Payer: Medicare Other | Admitting: Physical Therapy

## 2023-08-01 ENCOUNTER — Encounter: Payer: Self-pay | Admitting: Physical Therapy

## 2023-08-01 DIAGNOSIS — G7102 Facioscapulohumeral muscular dystrophy: Secondary | ICD-10-CM | POA: Diagnosis not present

## 2023-08-01 DIAGNOSIS — R262 Difficulty in walking, not elsewhere classified: Secondary | ICD-10-CM

## 2023-08-01 DIAGNOSIS — R252 Cramp and spasm: Secondary | ICD-10-CM

## 2023-08-01 DIAGNOSIS — L905 Scar conditions and fibrosis of skin: Secondary | ICD-10-CM

## 2023-08-01 DIAGNOSIS — R293 Abnormal posture: Secondary | ICD-10-CM

## 2023-08-01 DIAGNOSIS — R1084 Generalized abdominal pain: Secondary | ICD-10-CM

## 2023-08-01 DIAGNOSIS — R102 Pelvic and perineal pain: Secondary | ICD-10-CM

## 2023-08-01 DIAGNOSIS — M6281 Muscle weakness (generalized): Secondary | ICD-10-CM

## 2023-08-01 NOTE — Therapy (Signed)
 OUTPATIENT PHYSICAL THERAPY MALE PELVIC TREATMENT   Patient Name: [name] MRN: [id number] DOB:[birth year], 73 y.o., male Today's Date: 08/01/2023  END OF SESSION:  PT End of Session - 08/01/23 1530     Visit Number 7    Date for PT Re-Evaluation 09/07/23    Authorization Type medicare    Authorization - Visit Number 7    Authorization - Number of Visits 10    PT Start Time 1530    PT Stop Time 1610    PT Time Calculation (min) 40 min    Activity Tolerance Patient tolerated treatment well    Behavior During Therapy [facility] for tasks assessed/performed             Past Medical History:  Diagnosis Date   Acute prostatitis 07/24/2007   Qualifier: Diagnosis of  By: Alyne Babinski MD, Mara Seminole A    Acute respiratory failure with hypoxia (HCC)    Allergy    mild   Arthritis    osteoarthritis   Aspiration pneumonia (HCC) 05/24/2022   Asthma    Blood transfusion without reported diagnosis    BPH (benign prostatic hypertrophy) with urinary obstruction    Crohn's ileitis (HCC) suspected 05/03/2017   Dilated aortic root (HCC)    noted on echo 08/2012   Diverticulitis of colon    Drug-induced acute pancreatitis without infection or necrosis - from 6 MP 09/24/2017   EPIDIDYMITIS 02/15/2010   Qualifier: Diagnosis of  By: Alyne Babinski MD, Lenis Quin    GERD (gastroesophageal reflux disease)    H/O: GI bleed    Hemorrhoids    Hepatitis 1975   unknown type    HERPES SIMPLEX INFECTION 10/14/2007   Qualifier: Diagnosis of  By: Alyne Babinski MD, Mara Seminole A    Hiatal hernia    Ileus following gastrointestinal surgery (HCC) 12/26/2011   Long term (current) use of systemic steroids 06/18/2018   Psoriasis    sees Dr. Jaclynn Mast at [facility].   Rectal fissure    Recurrent ventral incisional hernia 05/10/2012   Shingles    Short bowel syndrome    SVT (supraventricular tachycardia) (HCC)    Ulcer 08/21/2013   ileal   Past Surgical History:  Procedure Laterality Date   BOWEL RESECTION  12/19/2011    Procedure: SMALL BOWEL RESECTION;  Surgeon: Quitman Bucy, MD;  Location: [facility];  Service: General;  Laterality: N/A;  with anastamosis and insertion mesh   BRONCHOSCOPY     COLON SURGERY  01/2004   x 2   COLONOSCOPY W/ BIOPSIES  04/26/2017   per Dr. Willy Harvest, no polyps, benign inflammation, repeat in 5 yrs    CYSTOSCOPY     ESOPHAGOGASTRODUODENOSCOPY     HEMICOLECTOMY     left side, at [facility], diverticulitis   HEMORRHOID BANDING     HERNIA REPAIR     [phone] incisional hernia   ILEOSTOMY     ILEOSTOMY CLOSURE     INSERTION OF MESH  07/31/2012   Procedure: INSERTION OF MESH;  Surgeon: Quitman Bucy, MD;  Location: [facility];  Service: General;;   LAPAROTOMY  12/19/2011   Procedure: EXPLORATORY LAPAROTOMY;  Surgeon: Quitman Bucy, MD;  Location: [facility];  Service: General;  Laterality: N/A;   PACEMAKER IMPLANT N/A 12/01/2020   Procedure: PACEMAKER IMPLANT;  Surgeon: Lei Pump, MD;  Location: [facility];  Service: Cardiovascular;  Laterality: N/A;   PACEMAKER INSERTION Left    TONSILLECTOMY     UPPER GASTROINTESTINAL ENDOSCOPY  VASECTOMY     VENTRAL HERNIA REPAIR  07/31/2012   Procedure: HERNIA REPAIR VENTRAL ADULT;  Surgeon: Quitman Bucy, MD;  Location: [facility];  Service: General;  Laterality: N/A;   Patient Active Problem List   Diagnosis Date Noted   Difficult intravenous access 06/25/2023   Generalized weakness 05/24/2022   Drug overdose, intentional, initial encounter (HCC) 05/24/2022   Chronic pain syndrome 05/24/2022   Chronic anemia    Chronic prostatitis 05/01/2022   Bladder calculi 05/01/2022   Dysuria    Pelvic floor dysfunction 04/27/2022   Crohn's disease of small intestine with other complication (HCC) 02/13/2022   Protein-calorie malnutrition, severe 01/05/2022   Pacemaker 01/03/2022   External hemorrhoid, thrombosed 05/27/2019   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   Long-term use of immunosuppressant medication   -Rinvoq  06/28/2017   Crohn's ileitis (HCC)  05/03/2017   Psoriasis 08/23/2016   Insomnia 04/22/2014   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/06/2012   SVT (supraventricular tachycardia) (HCC) 08/04/2012   Vitamin D  deficiency 06/18/2012   Attention deficit hyperactivity disorder (ADHD) 05/28/2009   BPH (benign prostatic hyperplasia) 07/24/2007   Asthma 03/06/2007   GERD 03/06/2007   DIVERTICULITIS, HX OF 03/06/2007    PCP: Donley Furth, MD   REFERRING PROVIDER: Kenney Peacemaker, MD   REFERRING DIAG: R19.8 (ICD-10-CM) - Abdominal weakness   THERAPY DIAG:  Muscle weakness (generalized)  Cramp and spasm  Generalized abdominal pain  Scar adherent  Abnormal posture  Pelvic pain  Difficulty in walking, not elsewhere classified  Rationale for Evaluation and Treatment: Rehabilitation  ONSET DATE: 2005  SUBJECTIVE:                                                                                                                                                                                           SUBJECTIVE STATEMENT: I have done the manual work to the pelvic floor. Last night was my worse night. I have been working on my sitting posture.      Fluid intake: Yes: does not drink caffeine, water      PAIN:  Are you having pain? Yes: NPRS scale: 6/10 Pain location: abdomen Pain description: constant Aggravating factors: stress Relieving factors: treatment  PAIN:  Are you having pain? Yes NPRS scale: 5/10 Pain location: between anus and scrotum Pain type: dull and pressure,sharp Pain description: constant more at night  Aggravating factors: sitting, urinating, bowel movement, waking up in the morning.  Relieving factors: walking, moving  PRECAUTIONS: ICD/Pacemaker  RED FLAGS: None   WEIGHT BEARING RESTRICTIONS: No  FALLS:  Has patient fallen in last 6 months? Yes. Number of falls water  main  cover was not put back properly and tripped on it and recovered so not  due to balance  LIVING ENVIRONMENT: Lives with: lives with their spouse   OCCUPATION: retired  PLOF: Independent  PATIENT GOALS: build endurance back, strength of abdominal wall, reduction of pelvic pain  PERTINENT HISTORY:  Chron's disease of ilium; short bowel syndrome; chronic posterior anal fissure;, small bowel anastomosis's resection x 2, mesh removal x2; ileostomy closure  BOWEL MOVEMENT: Pain with bowel movement: No Type of bowel movement:Type (Bristol Stool Scale) Type 7, Frequency 5, Strain No, and Splinting no  goes multiple times due to short bowel syndrome Fully empty rectum: Yes:   Leakage: Yes: sometimes Pads: Yes: diaper Fiber supplement: No he is not suppose to  URINATION: Pain with urination: Yes Fully empty bladder: Yes:   Stream: Strong Urgency: Yes: sometimes Frequency: every 2 hours Leakage:  none   INTERCOURSE:Not active     OBJECTIVE:  Note: Objective measures were completed at Evaluation unless otherwise noted.   COGNITION: Overall cognitive status: Within functional limits for tasks assessed       FUNCTIONAL TESTS:  6 minute walk test: exertion 5 for 1601 feet  GAIT: Distance walked: 6 minutes walked 1601 feet with exertion of 5 moderate Assistive device utilized: None Level of assistance: Complete Independence Comments: walks with decreased trunk extension, decreased hip extension 08/01/23.  1304 feet  with exertion scale is 6  POSTURE: rounded shoulders, forward head, decreased lumbar lordosis, increased thoracic kyphosis, posterior pelvic tilt, and flexed trunk   PELVIC ALIGNMENT: ASIS is equal  LUMBARAROM/PROM:  A/PROM A/PROM  eval 07/25/23  Flexion full full  Extension 100% limitation Decreased by 75%  Right lateral flexion Decreased by 25% Decreased by 25%  Left lateral flexion Decreased by 25% Decreased by 25%  Right rotation Decreased by 25% Decreased by 25%  Left rotation Decreased by 25% Decreased by 25%   (Blank  rows = not tested)    LOWER EXTREMITY MMT:  MMT Right eval Left eval Right 07/25/23 Left  07/25/23  Hip extension 3/5 3/5 4/5 4/5  Hip abduction 4/5 3+/5 4/5 4/5   PALPATION:   General  rib cage is restricted, multiple abdominal scars that are restricted. Unable to tighten his stomach and will lift up his rib cage instead.                 External Perineal Exam ischiocavernosus, bulbocavernosus,                              Internal Pelvic Floor tenderness located in bilateral levator ani, obturator internist, coccygeus  Patient confirms identification and approves PT to assess internal pelvic floor and treatment Yes  PELVIC MMT:   MMT eval  Internal Anal Sphincter 1/5  External Anal Sphincter 1/5  Puborectalis 2/5  (Blank rows = not tested)        TONE: increased   TODAY'S TREATMENT:      08/01/23 Manual: Soft tissue mobilization: Patient laying on side with therapist performing manual work to the levator ani, obturator internist, along the ischiococcygeus  Internal pelvic floor techniques: No emotional/communication barriers or cognitive limitation. Patient is motivated to learn. Patient understands and agrees with treatment goals and plan. PT explains patient will be examined in standing, sitting, and lying down to see how their muscles and joints work. When they are ready, they will be asked to remove their underwear so PT can examine their  perineum. The patient is also given the option of providing their own chaperone as one is not provided in our facility. The patient also has the right and is explained the right to defer or refuse any part of the evaluation or treatment including the internal exam. With the patient's consent, PT will use one gloved finger to gently assess the muscles of the pelvic floor, seeing how well it contracts and relaxes and if there is muscle symmetry. After, the patient will get dressed and PT and patient will discuss exam findings and plan of  care. PT and patient discuss plan of care, schedule, attendance policy and HEP activities.  Going through the rectum with patient laying on the right side performing manual work to the levator ani,  obturator internist, along the puborectalis while monitoring for pain and tissue reaction Exercises: Stretches/mobility: Educated patient on device he can purchase to use on the pelvic floor muscles going through the rectum Strengthening: Walk 6 minutes 1304 feet  with exertion scale is 6     07/25/23 Manual: Soft tissue mobilization: Manual work to the perineal body, along the pubic rami, levator ani, obturator internist, and ischiocavernous, and bulbospongiosus to elongate  Educated patient on how to perform perineal massage at home to relax the muscles. Exercises: Stretches/mobility: Happy baby holding 1 minute supine Supine piriformis stretch holding 30 sec Supine trunk rotation pulling leg across body holding 30 sec bil.  Open book stretch in sidely holding 30 sec 2 times bil.                                                                                                                            DATE: 07/17/23 Nustep x 5 min level 5 Seated lateral overhead reach x 5 each side holding 5 sec each Seated bilateral overhead reach x 5 holding 5 sec each Seated trunk rotation x 5 each side holding 5 sec each Hooklying trunk rotation x 10 each side Supine hanging LE off edge of mat table x 10 each LE holding 5 sec each  Prone lying x 1 min Prone on elbows x 1 min Manual STM and incision mobilization to all adhesions.  Kinesiotaping to right abdominal adhesions (medial to lateral pull with midline anchor) x 12 min.  DATE: 06/20/23 Manual scar tissue mobility, abdominal massage, general soft tissue mobility K tape to abdominal scars   DATE: 06/20/23  Nustep x 5 min level 4 Seated upright posture with overhead lateral reach stretch x 10 Seated scapular retraction x 20 Seated green tband  rows with PT anchoring  2 X 10 Seated bilateral ER x 20 with red band Seated bilateral shoulder horizontal abduction x 20 with red tband Supine hip flexor stretch (SKTC with manual overpressure) Hooklying trunk rotation x 5 each side Removed Ktape: decided to hold on re-applying due to slight tenderness   PATIENT EDUCATION: 07/25/23 Education details: Access Code: [email or web address], educated patient on perineal massage and sent you tube video Person  educated: Patient Education method: Explanation, Demonstration, Tactile cues, Verbal cues, and Handouts Education comprehension: verbalized understanding, returned demonstration, verbal cues required, tactile cues required, and needs further education    HOME EXERCISE PROGRAM: 07/25/23 Access Code: [email or web address] URL: [email or web address] Date: 07/25/2023 Prepared by: Marsha Skeen  Exercises - Supine Figure 4 Piriformis Stretch  - 1 x daily - 7 x weekly - 1 sets - 2 reps - 30 sec hold - Supine Piriformis Stretch with Leg Straight  - 1 x daily - 7 x weekly - 1 sets - 2 reps - 30 sec hold - Sidelying Thoracic Rotation with Open Book  - 1 x daily - 7 x weekly - 1 sets - 2 reps - 30 sec hold    ASSESSMENT:  CLINICAL IMPRESSION: Patient did 6 minute walk test with 1304 feet  with exertion scale is 6 compared to 1610 feet exertion 5 on eval.  Pain level decreased to 2/10 after the manual work from 5/10. His pelvic floor muscles responded well to the manual work and able to relax. He continues to leak urine and stool due to the muscles  being tight and decreased ability to contract.  Patient will benefit from skilled therapy to improve overall endurance, posture, strength and reduce his pain.   OBJECTIVE IMPAIRMENTS: Abnormal gait, decreased activity tolerance, decreased coordination, decreased endurance, difficulty walking, decreased ROM, decreased strength, increased fascial restrictions, increased muscle spasms, impaired tone, postural  dysfunction, and pain.   ACTIVITY LIMITATIONS: sitting, standing, continence, toileting, and locomotion level  PARTICIPATION LIMITATIONS: shopping and community activity  PERSONAL FACTORS: Fitness, Time since onset of injury/illness/exacerbation, and 3+ comorbidities: Chron's disease of ilium; short bowel syndrome; chronic posterior anal fissure;, small bowel anastomosis's resection x 2, mesh removal x2; ileostomy closure  are also affecting patient's functional outcome.   REHAB POTENTIAL: Excellent  CLINICAL DECISION MAKING: Evolving/moderate complexity  EVALUATION COMPLEXITY: Moderate   GOALS: Goals reviewed with patient? Yes  SHORT TERM GOALS: Target date: 06/15/23  Patient independent with initial HEP for strengthening and flexibility.  Baseline: Goal status: MET 07/17/23  2.  Patient understands how to perform scar massage to lengthen the tissue.  Baseline:  Goal status: MET 07/17/23  3.  Patient reports his abdominal pain decreased >/= 25 % due to reduction of restrictions.  Baseline:  Goal status: In Progress  4.  Patient is able to contract his abdominals with not lifting his rib cage.  Baseline:  Goal status: In Progress  5.  Patient has increased lumbar extension by 25% due to increased lumbar ROM and length of his abdominals.  Baseline:  Goal status:MET 07/17/23  6.  Patient able to do the 6 minute walk test with exertion </= 2/10.  Baseline:  Goal status: In progress  LONG TERM GOALS: Target date: 07/13/23  Patient independent with advanced HEP for strength, flexibility, and endurance.  Baseline:  Goal status: INITIAL  2.  Patient pelvic floor strength >/= 3/5 holding for 40 seconds so his fecal leakage decreased >/= 75%.  Baseline:  Goal status: INITIAL  3.  Patient is able to walk >/= 1729 feet in 6 minutes with 0-1 exertion due to increased strength and rib mobility.  Baseline:  Goal status: INITIAL  4.  Patient is able to stand upright due to  increased length of the anterior trunk and lumbar extension improve by 50%.  Baseline:  Goal status: INITIAL  5.  Patient has reduction of pelvic pain decreased >/= 50% due to improve abdominal strength and elongation.  Baseline:  Goal status: INITIAL   PLAN:  PT FREQUENCY: 2x/week  PT DURATION: 8 weeks  PLANNED INTERVENTIONS: 97110-Therapeutic exercises, 97530- Therapeutic activity, [id number]- Neuromuscular re-education, 97140- Manual therapy, [id number]- Gait training, 16109- Ultrasound, Patient/Family education, Balance training, Dry Needling, Joint manipulation, Spinal mobilization, Scar mobilization, Cryotherapy, Moist heat, and Biofeedback  PLAN FOR NEXT SESSION:  We will do 1-2 times per week.  Continue manual work to abdomen, dry needling to scars if indicated, kinesiotape to scars for mobility, endurance training, hip and back extensor strength, nustep,  Pelvic floor: manual work to pelvic floor, abdominal work, 6 minute walk test,see if he got the wand.     Marsha Skeen, PT 08/01/23 4:15 PM

## 2023-08-05 ENCOUNTER — Encounter: Payer: Self-pay | Admitting: Pediatrics

## 2023-08-07 ENCOUNTER — Emergency Department (HOSPITAL_BASED_OUTPATIENT_CLINIC_OR_DEPARTMENT_OTHER): Payer: Medicare Other | Admitting: Radiology

## 2023-08-07 ENCOUNTER — Encounter (HOSPITAL_BASED_OUTPATIENT_CLINIC_OR_DEPARTMENT_OTHER): Payer: Self-pay | Admitting: Emergency Medicine

## 2023-08-07 ENCOUNTER — Other Ambulatory Visit: Payer: Self-pay

## 2023-08-07 ENCOUNTER — Emergency Department (HOSPITAL_BASED_OUTPATIENT_CLINIC_OR_DEPARTMENT_OTHER)
Admission: EM | Admit: 2023-08-07 | Discharge: 2023-08-07 | Disposition: A | Discharge status: Home / Self Care | Payer: Medicare Other | Attending: Emergency Medicine | Admitting: Emergency Medicine

## 2023-08-07 DIAGNOSIS — B349 Viral infection, unspecified: Secondary | ICD-10-CM | POA: Diagnosis not present

## 2023-08-07 DIAGNOSIS — K5090 Crohn's disease, unspecified, without complications: Secondary | ICD-10-CM | POA: Insufficient documentation

## 2023-08-07 DIAGNOSIS — J45909 Unspecified asthma, uncomplicated: Secondary | ICD-10-CM | POA: Diagnosis not present

## 2023-08-07 DIAGNOSIS — R079 Chest pain, unspecified: Secondary | ICD-10-CM | POA: Diagnosis present

## 2023-08-07 DIAGNOSIS — Z20822 Contact with and (suspected) exposure to covid-19: Secondary | ICD-10-CM | POA: Diagnosis not present

## 2023-08-07 DIAGNOSIS — R0602 Shortness of breath: Secondary | ICD-10-CM | POA: Insufficient documentation

## 2023-08-07 DIAGNOSIS — K90829 Short bowel syndrome, unspecified: Secondary | ICD-10-CM | POA: Insufficient documentation

## 2023-08-07 LAB — BASIC METABOLIC PANEL
Anion gap: 6 (ref 5–15)
BUN: 18 mg/dL (ref 8–23)
CO2: 33 mmol/L — ABNORMAL HIGH (ref 22–32)
Calcium: 9.7 mg/dL (ref 8.9–10.3)
Chloride: 99 mmol/L (ref 98–111)
Creatinine, Ser: 1.17 mg/dL (ref 0.61–1.24)
GFR, Estimated: >60 mL/min (ref >60)
Glucose, Bld: 83 mg/dL (ref 70–99)
Potassium: 4.6 mmol/L (ref 3.5–5.1)
Sodium: 138 mmol/L (ref 135–145)

## 2023-08-07 LAB — RESP PANEL BY RT-PCR (RSV, FLU A&B, COVID)  RVPGX2
Influenza A by PCR: NEGATIVE
Influenza B by PCR: NEGATIVE
Resp Syncytial Virus by PCR: NEGATIVE
SARS Coronavirus 2 by RT PCR: NEGATIVE

## 2023-08-07 LAB — CBC
HCT: 40.6 % (ref 39.0–52.0)
Hemoglobin: 13.4 g/dL (ref 13.0–17.0)
MCH: 31.8 pg (ref 26.0–34.0)
MCHC: 33 g/dL (ref 30.0–36.0)
MCV: 96.4 fL (ref 80.0–100.0)
Platelets: 122 10*3/uL — ABNORMAL LOW (ref 150–400)
RBC: 4.21 MIL/uL — ABNORMAL LOW (ref 4.22–5.81)
RDW: 12.9 % (ref 11.5–15.5)
WBC: 7.6 10*3/uL (ref 4.0–10.5)
nRBC: 0 % (ref 0.0–0.2)

## 2023-08-07 LAB — TROPONIN I (HIGH SENSITIVITY): Troponin I (High Sensitivity): 4 ng/L (ref <18)

## 2023-08-07 NOTE — ED Notes (Signed)
Discharge paperwork given and verbally understood. 

## 2023-08-07 NOTE — ED Notes (Addendum)
Bloodwork obtained at 1800.Marland KitchenMarland KitchenMarland Kitchen

## 2023-08-07 NOTE — ED Triage Notes (Signed)
C/o central CP and SHOB since yesterday. Has pacemaker. Also endorses fever and body aches.

## 2023-08-07 NOTE — ED Provider Notes (Signed)
[place] EMERGENCY DEPARTMENT AT [facility] Provider Note   CSN: [id number] Arrival date & time: 08/07/23  1637     History  Chief Complaint  Patient presents with   Chest Pain    CAMARA BATTISTINI is a 73 y.o. male.   Chest Pain Patient with history of Crohn's disease.  Short gut syndrome.  States some chest pain mild shortness of breath.  Since yesterday.  States has had some chills at home.  Some myalgias.  No sick contacts.  States he feels if he has the flu.  No real change of bowel habit.  No real change in oral intake.    Past Medical History:  Diagnosis Date   Acute prostatitis 07/24/2007   Qualifier: Diagnosis of  By: Clent Ridges MD, Jeannett Senior A    Acute respiratory failure with hypoxia [facility])    Allergy    mild   Arthritis    osteoarthritis   Aspiration pneumonia (HCC) 05/24/2022   Asthma    Blood transfusion without reported diagnosis    BPH (benign prostatic hypertrophy) with urinary obstruction    Crohn's ileitis (HCC) suspected 05/03/2017   Dilated aortic root (HCC)    noted on echo 08/2012   Diverticulitis of colon    Drug-induced acute pancreatitis without infection or necrosis - from 6 MP 09/24/2017   EPIDIDYMITIS 02/15/2010   Qualifier: Diagnosis of  By: Clent Ridges MD, Tera Mater    GERD (gastroesophageal reflux disease)    H/O: GI bleed    Hemorrhoids    Hepatitis 1975   unknown type    HERPES SIMPLEX INFECTION 10/14/2007   Qualifier: Diagnosis of  By: Clent Ridges MD, Jeannett Senior A    Hiatal hernia    Ileus following gastrointestinal surgery (HCC) 12/26/2011   Long term (current) use of systemic steroids 06/18/2018   Psoriasis    sees Dr. Karene Fry at [facility].   Rectal fissure    Recurrent ventral incisional hernia 05/10/2012   Shingles    Short bowel syndrome    SVT (supraventricular tachycardia) (HCC)    Ulcer 08/21/2013   ileal    Home Medications Prior to Admission medications   Medication Sig Start Date End Date Taking? Authorizing Provider   acetaminophen (TYLENOL) 325 MG tablet Take 650 mg by mouth every 6 (six) hours as needed for moderate pain.    [provider]  albuterol (VENTOLIN HFA) 108 (90 Base) MCG/ACT inhaler Inhale 2 puffs into the lungs every 6 (six) hours as needed for wheezing or shortness of breath. 06/22/22   Almon Hercules, MD  AMBULATORY NON FORMULARY MEDICATION Medication Name: nifedipine 0.2%/lidocaine 5% ointment 1:1 mix Apply 3-4 tmes/day into anal canal 01/23/23   Iva Boop, MD  ascorbic acid (VITAMIN C) 250 MG CHEW Chew by mouth. Patient not taking: Reported on 07/31/2023    [provider]  busPIRone (BUSPAR) 10 MG tablet Take 10 mg by mouth every morning.    [provider]  calcium carbonate (TUMS - DOSED IN MG ELEMENTAL CALCIUM) 500 MG chewable tablet Chew 500 mg by mouth 3 (three) times daily as needed for indigestion or heartburn.    [provider]  Cholecalciferol (VITAMIN D) 50 MCG (2000 UT) tablet Take 1 tablet (2,000 Units total) by mouth daily. 01/02/23   Iva Boop, MD  diphenoxylate-atropine (LOMOTIL) 2.5-0.025 MG tablet Take 2 tablets by mouth 4 (four) times daily. 03/08/23   Iva Boop, MD  ferrous sulfate 325 (65 FE) MG tablet Take  1 tablet (325 mg total) by mouth daily with breakfast. 10/10/22   Iva Boop, MD  finasteride (PROSCAR) 5 MG tablet Take 5 mg by mouth daily.    [provider]  gabapentin (NEURONTIN) 100 MG capsule Take 1 capsule (100 mg total) by mouth 3 (three) times daily. 05/25/23   [name], MD  HYDROcodone-acetaminophen (NORCO) 5-325 MG tablet Take 1 tablet by mouth every 8 (eight) hours as needed for moderate pain (pain score 4-6). 07/20/23   [name], MD  HYDROcodone-acetaminophen (NORCO) 5-325 MG tablet Take 1 tablet by mouth every 8 (eight) hours as needed for moderate pain (pain score 4-6). 07/20/23   [name], MD  HYDROcodone-acetaminophen (NORCO) 5-325 MG tablet Take 1 tablet by mouth every 8  (eight) hours as needed for moderate pain (pain score 4-6). 07/20/23   [name], MD  hyoscyamine (LEVSIN SL) 0.125 MG SL tablet Place 0.125 mg under the tongue every 6 (six) hours as needed for cramping.    [provider]  magnesium oxide (MAG-OX) 400 MG tablet Take 1 tablet (400 mg total) by mouth 2 (two) times daily. 02/18/23   Iva Boop, MD  melatonin 3 MG TABS tablet Take 3 mg by mouth at bedtime.    [provider]  Multiple Vitamin (MULTIVITAMIN WITH MINERALS) TABS tablet Take 1 tablet by mouth daily.    [provider]  ondansetron (ZOFRAN-ODT) 4 MG disintegrating tablet Dissolve 1 tablet (4 mg total) by mouth every 6 (six) hours as needed for nausea or vomiting. 06/22/22   Almon Hercules, MD  pantoprazole (PROTONIX) 40 MG tablet Take 40 mg by mouth daily.    [provider]  phenazopyridine (PYRIDIUM) 200 MG tablet Take 1 tablet (200 mg total) by mouth 3 (three) times daily as needed (urinary pain). 04/25/23   [name], MD  Potassium Chloride ER 20 MEQ TBCR TAKE 1 TABLET(20 MEQ) BY MOUTH TWICE DAILY 05/31/23   Iva Boop, MD  rifaximin (XIFAXAN) 550 MG TABS tablet Take 1 tablet (550 mg total) by mouth 3 (three) times daily. 06/28/23   Iva Boop, MD  simethicone (MYLICON) 80 MG chewable tablet Chew 80 mg by mouth 4 (four) times daily -  with meals and at bedtime.    [provider]  thiamine (VITAMIN B1) 100 MG tablet Take 1 tablet (100 mg total) by mouth daily. 10/10/22   Iva Boop, MD  traZODone (DESYREL) 100 MG tablet Take 200 mg by mouth at bedtime.    [provider]  Upadacitinib ER (RINVOQ) 15 MG TB24 Take 15 mg by mouth daily.    [provider]  valACYclovir (VALTREX) 1000 MG tablet Take one tablet 3 times a day for 7 days as needed for shingles flares 03/02/23   [name], MD      Allergies    Purinethol [mercaptopurine], Shellfish allergy, Humira [adalimumab], Tape, and Wound dressing  adhesive    Review of Systems   Review of Systems  Cardiovascular:  Positive for chest pain.    Physical Exam Updated Vital Signs BP 124/65   Pulse (!) 58   Temp (!) 97.2 F (36.2 C)   Resp 13   Ht 5\' 10"  (1.778 m)   Wt 70.3 kg   SpO2 95%   BMI 22.24 kg/m  Physical Exam Constitutional:      Appearance: He is well-developed.  Cardiovascular:     Rate and Rhythm: Normal rate  and regular rhythm.  Pulmonary:     Breath sounds: No wheezing.  Chest:     Chest wall: No tenderness.  Musculoskeletal:     Right lower leg: No edema.     Left lower leg: No edema.  Neurological:     Mental Status: He is alert.     ED Results / Procedures / Treatments   Labs (all labs ordered are listed, but only abnormal results are displayed) Labs Reviewed  BASIC METABOLIC PANEL - Abnormal; Notable for the following components:      Result Value   CO2 33 (*)    All other components within normal limits  CBC - Abnormal; Notable for the following components:   RBC 4.21 (*)    Platelets 122 (*)    All other components within normal limits  RESP PANEL BY RT-PCR (RSV, FLU A&B, COVID)  [id number]  TROPONIN I (HIGH SENSITIVITY)    EKG EKG Interpretation Date/Time:  Tuesday August 07 2023 16:52:14 EST Ventricular Rate:  61 PR Interval:  188 QRS Duration:  118 QT Interval:  418 QTC Calculation: 420 R Axis:   -78  Text Interpretation: Atrial-paced rhythm Pulmonary disease pattern Right bundle branch block Left anterior fascicular block Bifascicular block Abnormal ECG When compared with ECG of 16-May-2022 18:35, Electronic atrial pacemaker has replaced Atrial fibrillation Vent. rate has decreased BY  82 BPM (RBBB and left anterior fascicular block) is now Present Confirmed by Benjiman Core [phone]) on 08/07/2023 5:34:23 PM  Radiology DG Chest 2 View Result Date: 08/07/2023 CLINICAL DATA:  Chest pain and shortness of breath. EXAM: CHEST - 2 VIEW COMPARISON:  Chest radiograph dated 06/05/2022.  FINDINGS: No focal consolidation, pleural effusion, or pneumothorax. Background of emphysema. The cardiac silhouette is within normal limits. Left pectoral pacemaker device. No acute osseous pathology. IMPRESSION: 1. No active cardiopulmonary disease. 2. Emphysema. Electronically Signed   By: Elgie Collard M.D.   On: 08/07/2023 17:31    Procedures Procedures    Medications Ordered in ED Medications - No data to display  ED Course/ Medical Decision Making/ A&P                                 Medical Decision Making Amount and/or Complexity of Data Reviewed Labs: ordered. Radiology: ordered.   Patient with chest pain.  Shortness of breath.  Began yesterday.  History of Crohn's disease.  Differential diagnosis is long but includes causes such as viral syndromes, pneumonia.  I think less likely abdominal related.  Will get basic blood work.  X-ray reassuring.  Flu COVID and RSV testing negative. Basic blood work reassuring.  Feeling somewhat better.  Likely viral syndrome.  Abdomen nontender and doubt Crohn's flare.  Patient also said he had a CT scan in November does not think he needs another one.  I think this is reasonable.  Will discharge home.        Final Clinical Impression(s) / ED Diagnoses Final diagnoses:  Viral syndrome    Rx / DC Orders ED Discharge Orders     None         Benjiman Core, MD 08/07/23 2334

## 2023-08-08 ENCOUNTER — Ambulatory Visit: Payer: Medicare Other | Admitting: Physical Therapy

## 2023-08-08 ENCOUNTER — Telehealth: Payer: Self-pay

## 2023-08-08 ENCOUNTER — Encounter: Payer: Self-pay | Admitting: Physical Therapy

## 2023-08-08 DIAGNOSIS — R1084 Generalized abdominal pain: Secondary | ICD-10-CM

## 2023-08-08 DIAGNOSIS — R252 Cramp and spasm: Secondary | ICD-10-CM

## 2023-08-08 DIAGNOSIS — M6281 Muscle weakness (generalized): Secondary | ICD-10-CM

## 2023-08-08 DIAGNOSIS — G7102 Facioscapulohumeral muscular dystrophy: Secondary | ICD-10-CM | POA: Diagnosis not present

## 2023-08-08 DIAGNOSIS — R293 Abnormal posture: Secondary | ICD-10-CM

## 2023-08-08 DIAGNOSIS — R262 Difficulty in walking, not elsewhere classified: Secondary | ICD-10-CM

## 2023-08-08 DIAGNOSIS — L905 Scar conditions and fibrosis of skin: Secondary | ICD-10-CM

## 2023-08-08 DIAGNOSIS — R102 Pelvic and perineal pain: Secondary | ICD-10-CM

## 2023-08-08 NOTE — Therapy (Signed)
OUTPATIENT PHYSICAL THERAPY MALE PELVIC TREATMENT   Patient Name: [name] MRN: [id number] DOB:1950/11/20, 73 y.o., male Today's Date: 08/08/2023  END OF SESSION:  PT End of Session - 08/08/23 1404     Visit Number 8    Date for PT Re-Evaluation 09/07/23    Authorization Type 8    Authorization - Visit Number 8    Authorization - Number of Visits 10    PT Start Time 1400    PT Stop Time 1515    PT Time Calculation (min) 75 min    Activity Tolerance Patient tolerated treatment well    Behavior During Therapy [facility] for tasks assessed/performed             Past Medical History:  Diagnosis Date   Acute prostatitis 07/24/2007   Qualifier: Diagnosis of  By: Clent Ridges MD, Jeannett Senior A    Acute respiratory failure with hypoxia (HCC)    Allergy    mild   Arthritis    osteoarthritis   Aspiration pneumonia (HCC) 05/24/2022   Asthma    Blood transfusion without reported diagnosis    BPH (benign prostatic hypertrophy) with urinary obstruction    Crohn's ileitis (HCC) suspected 05/03/2017   Dilated aortic root (HCC)    noted on echo 08/2012   Diverticulitis of colon    Drug-induced acute pancreatitis without infection or necrosis - from 6 MP 09/24/2017   EPIDIDYMITIS 02/15/2010   Qualifier: Diagnosis of  By: Clent Ridges MD, Tera Mater    GERD (gastroesophageal reflux disease)    H/O: GI bleed    Hemorrhoids    Hepatitis 1975   unknown type    HERPES SIMPLEX INFECTION 10/14/2007   Qualifier: Diagnosis of  By: Clent Ridges MD, Jeannett Senior A    Hiatal hernia    Ileus following gastrointestinal surgery (HCC) 12/26/2011   Long term (current) use of systemic steroids 06/18/2018   Psoriasis    sees Dr. Karene Fry at [facility].   Rectal fissure    Recurrent ventral incisional hernia 05/10/2012   Shingles    Short bowel syndrome    SVT (supraventricular tachycardia) (HCC)    Ulcer 08/21/2013   ileal   Past Surgical History:  Procedure Laterality Date   BOWEL RESECTION  12/19/2011   Procedure:  SMALL BOWEL RESECTION;  Surgeon: Mariella Saa, MD;  Location: [facility];  Service: General;  Laterality: N/A;  with anastamosis and insertion mesh   BRONCHOSCOPY     COLON SURGERY  01/2004   x 2   COLONOSCOPY W/ BIOPSIES  04/26/2017   per Dr. Leone Payor, no polyps, benign inflammation, repeat in 5 yrs    CYSTOSCOPY     ESOPHAGOGASTRODUODENOSCOPY     HEMICOLECTOMY     left side, at [facility], diverticulitis   HEMORRHOID BANDING     HERNIA REPAIR     [phone] incisional hernia   ILEOSTOMY     ILEOSTOMY CLOSURE     INSERTION OF MESH  07/31/2012   Procedure: INSERTION OF MESH;  Surgeon: Mariella Saa, MD;  Location: [facility];  Service: General;;   LAPAROTOMY  12/19/2011   Procedure: EXPLORATORY LAPAROTOMY;  Surgeon: Mariella Saa, MD;  Location: [facility];  Service: General;  Laterality: N/A;   PACEMAKER IMPLANT N/A 12/01/2020   Procedure: PACEMAKER IMPLANT;  Surgeon: Regan Lemming, MD;  Location: [facility];  Service: Cardiovascular;  Laterality: N/A;   PACEMAKER INSERTION Left    TONSILLECTOMY     UPPER GASTROINTESTINAL ENDOSCOPY  VASECTOMY     VENTRAL HERNIA REPAIR  07/31/2012   Procedure: HERNIA REPAIR VENTRAL ADULT;  Surgeon: Mariella Saa, MD;  Location: [facility];  Service: General;  Laterality: N/A;   Patient Active Problem List   Diagnosis Date Noted   Difficult intravenous access 06/25/2023   Generalized weakness 05/24/2022   Drug overdose, intentional, initial encounter (HCC) 05/24/2022   Chronic pain syndrome 05/24/2022   Chronic anemia    Chronic prostatitis 05/01/2022   Bladder calculi 05/01/2022   Dysuria    Pelvic floor dysfunction 04/27/2022   Crohn's disease of small intestine with other complication (HCC) 02/13/2022   Protein-calorie malnutrition, severe 01/05/2022   Pacemaker 01/03/2022   External hemorrhoid, thrombosed 05/27/2019   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   Long-term use of immunosuppressant medication  -Rinvoq  06/28/2017   Crohn's ileitis (HCC)  05/03/2017   Psoriasis 08/23/2016   Insomnia 04/22/2014   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/06/2012   SVT (supraventricular tachycardia) (HCC) 08/04/2012   Vitamin D deficiency 06/18/2012   Attention deficit hyperactivity disorder (ADHD) 05/28/2009   BPH (benign prostatic hyperplasia) 07/24/2007   Asthma 03/06/2007   GERD 03/06/2007   DIVERTICULITIS, HX OF 03/06/2007    PCP: [name], MD   REFERRING PROVIDER: Iva Boop, MD   REFERRING DIAG: R19.8 (ICD-10-CM) - Abdominal weakness   THERAPY DIAG:  Muscle weakness (generalized)  Cramp and spasm  Generalized abdominal pain  Scar adherent  Abnormal posture  Pelvic pain  Difficulty in walking, not elsewhere classified  Rationale for Evaluation and Treatment: Rehabilitation  ONSET DATE: 2005  SUBJECTIVE:                                                                                                                                                                                           SUBJECTIVE STATEMENT: I went to the hospital yesterday in the emergency visit due to chest and feeling like he had the flu. Flu test was negative. Patient got the wand and used it several times. It has helped with  the pain.  Fluid intake: Yes: does not drink caffeine, water     PAIN:  Are you having pain? Yes: NPRS scale: 6/10 Pain location: abdomen Pain description: constant Aggravating factors: stress Relieving factors: treatment  PAIN:  Are you having pain? Yes NPRS scale: 5/10 Pain location: between anus and scrotum Pain type: dull and pressure,sharp Pain description: constant more at night  Aggravating factors: sitting, urinating, bowel movement, waking up in the morning.  Relieving factors: walking, moving  PRECAUTIONS: ICD/Pacemaker  RED FLAGS: None   WEIGHT BEARING RESTRICTIONS: No  FALLS:  Has  patient fallen in last 6 months? Yes. Number of falls water main cover  was not put back properly and tripped on it and recovered so not due to balance  LIVING ENVIRONMENT: Lives with: lives with their spouse   OCCUPATION: retired  PLOF: Independent  PATIENT GOALS: build endurance back, strength of abdominal wall, reduction of pelvic pain  PERTINENT HISTORY:  Chron's disease of ilium; short bowel syndrome; chronic posterior anal fissure;, small bowel anastomosis's resection x 2, mesh removal x2; ileostomy closure  BOWEL MOVEMENT: Pain with bowel movement: No Type of bowel movement:Type (Bristol Stool Scale) Type 7, Frequency 5, Strain No, and Splinting no  goes multiple times due to short bowel syndrome Fully empty rectum: Yes:   Leakage: Yes: sometimes Pads: Yes: diaper Fiber supplement: No he is not suppose to  URINATION: Pain with urination: Yes Fully empty bladder: Yes:   Stream: Strong Urgency: Yes: sometimes Frequency: every 2 hours Leakage:  none   INTERCOURSE:Not active     OBJECTIVE:  Note: Objective measures were completed at Evaluation unless otherwise noted.   COGNITION: Overall cognitive status: Within functional limits for tasks assessed       FUNCTIONAL TESTS:  6 minute walk test: exertion 5 for 1601 feet  GAIT: Distance walked: 6 minutes walked 1601 feet with exertion of 5 moderate Assistive device utilized: None Level of assistance: Complete Independence Comments: walks with decreased trunk extension, decreased hip extension 08/01/23.  1304 feet  with exertion scale is 6  POSTURE: rounded shoulders, forward head, decreased lumbar lordosis, increased thoracic kyphosis, posterior pelvic tilt, and flexed trunk   PELVIC ALIGNMENT: ASIS is equal  LUMBARAROM/PROM:  A/PROM A/PROM  eval 07/25/23  Flexion full full  Extension 100% limitation Decreased by 75%  Right lateral flexion Decreased by 25% Decreased by 25%  Left lateral flexion Decreased by 25% Decreased by 25%  Right rotation Decreased by 25% Decreased by  25%  Left rotation Decreased by 25% Decreased by 25%   (Blank rows = not tested)    LOWER EXTREMITY MMT:  MMT Right eval Left eval Right 07/25/23 Left  07/25/23  Hip extension 3/5 3/5 4/5 4/5  Hip abduction 4/5 3+/5 4/5 4/5   PALPATION:   General  rib cage is restricted, multiple abdominal scars that are restricted. Unable to tighten his stomach and will lift up his rib cage instead.                 External Perineal Exam ischiocavernosus, bulbocavernosus,                              Internal Pelvic Floor tenderness located in bilateral levator ani, obturator internist, coccygeus  Patient confirms identification and approves PT to assess internal pelvic floor and treatment Yes  PELVIC MMT:   MMT eval  Internal Anal Sphincter 1/5  External Anal Sphincter 1/5  Puborectalis 2/5  (Blank rows = not tested)        TONE: increased   TODAY'S TREATMENT:      08/08/23 Manual: Soft tissue mobilization: Manual work to the diaphragm to lengthen the tissue Scar tissue mobilization: Lifting up the scar and mobilizing the scar in different directions to release the restrictions throughout the abdomen Myofascial release: Tissue rolling of the abdomen, around the diaphragm and lower rib cage Spinal mobilization: As the patient breaths out the therapist assist the rib cage to go downward in supine Exercises: Stretches/mobility: Educated patient on how  to use the pelvic wand rectally on the male pelvic floor model Marjo Bicker pose with diaphragmatic breathing into the back and expand the lower rib cage and the diaphragm Laying on side with ball under the rib cage to breath into the ball and open up the side of the ribcage and lungs Laying prone with breathing and focusing on expanding his abdomen into the mat to open up the abdominals Strengthening: Walk 6 minutes 1187 feet  with exertion scale is 5 and walked with his dog at the same time.     08/01/23 Manual: Soft tissue  mobilization: Patient laying on side with therapist performing manual work to the levator ani, obturator internist, along the ischiococcygeus  Internal pelvic floor techniques: No emotional/communication barriers or cognitive limitation. Patient is motivated to learn. Patient understands and agrees with treatment goals and plan. PT explains patient will be examined in standing, sitting, and lying down to see how their muscles and joints work. When they are ready, they will be asked to remove their underwear so PT can examine their perineum. The patient is also given the option of providing their own chaperone as one is not provided in our facility. The patient also has the right and is explained the right to defer or refuse any part of the evaluation or treatment including the internal exam. With the patient's consent, PT will use one gloved finger to gently assess the muscles of the pelvic floor, seeing how well it contracts and relaxes and if there is muscle symmetry. After, the patient will get dressed and PT and patient will discuss exam findings and plan of care. PT and patient discuss plan of care, schedule, attendance policy and HEP activities.  Going through the rectum with patient laying on the right side performing manual work to the levator ani,  obturator internist, along the puborectalis while monitoring for pain and tissue reaction Exercises: Stretches/mobility: Educated patient on device he can purchase to use on the pelvic floor muscles going through the rectum Strengthening: Walk 6 minutes 1304 feet  with exertion scale is 6     07/25/23 Manual: Soft tissue mobilization: Manual work to the perineal body, along the pubic rami, levator ani, obturator internist, and ischiocavernous, and bulbospongiosus to elongate  Educated patient on how to perform perineal massage at home to relax the muscles. Exercises: Stretches/mobility: Happy baby holding 1 minute supine Supine piriformis  stretch holding 30 sec Supine trunk rotation pulling leg across body holding 30 sec bil.  Open book stretch in sidely holding 30 sec 2 times bil.                                                                                                                            DATE: 07/17/23 Nustep x 5 min level 5 Seated lateral overhead reach x 5 each side holding 5 sec each Seated bilateral overhead reach x 5 holding 5 sec each Seated trunk rotation x 5 each side holding 5  sec each Hooklying trunk rotation x 10 each side Supine hanging LE off edge of mat table x 10 each LE holding 5 sec each  Prone lying x 1 min Prone on elbows x 1 min Manual STM and incision mobilization to all adhesions.  Kinesiotaping to right abdominal adhesions (medial to lateral pull with midline anchor) x 12 min.  PATIENT EDUCATION: 08/08/23 Education details: Access Code: [email or web address], educated patient on perineal massage and sent you tube video Person educated: Patient Education method: Explanation, Demonstration, Tactile cues, Verbal cues, and Handouts Education comprehension: verbalized understanding, returned demonstration, verbal cues required, tactile cues required, and needs further education    HOME EXERCISE PROGRAM: 08/08/23 Access Code: [email or web address] URL: [email or web address] Date: 08/08/2023 Prepared by: Eulis Foster  Exercises - Supine Figure 4 Piriformis Stretch  - 1 x daily - 7 x weekly - 1 sets - 2 reps - 30 sec hold - Supine Piriformis Stretch with Leg Straight  - 1 x daily - 7 x weekly - 1 sets - 2 reps - 30 sec hold - Sidelying Thoracic Rotation with Open Book  - 1 x daily - 7 x weekly - 1 sets - 2 reps - 30 sec hold - Prone Diaphragmatic Breathing  - 1 x daily - 7 x weekly - 3 sets - 10 reps - Diaphragmatic Breathing in Child's Pose with Pelvic Floor Relaxation  - 1 x daily - 7 x weekly - 3 sets - 10 reps - Sidelying Diaphragmatic Breathing  - 1 x daily - 7 x weekly - 3 sets - 10  reps  ASSESSMENT:  CLINICAL IMPRESSION: Patient did 6 minute walk test with 1187 feet  with exertion scale is 5 with is dog and was not feeling well. He was in the ER last night with flu symptoms but tests were negative. Patient had improved mobility of the scars, abdominal tissue and diaphragm after manual work. He has difficult with opening up the lower rib cage into his abdomen due to the fascial restrictions and needing to retrain the diaphragm.   He is using the wand at home through the rectum and does help with manage the pain.   Patient still needs verbal cues to stand and sit upright to elongate the anterior trunk. Patient will benefit from skilled therapy to improve overall endurance, posture, strength and reduce his pain.   OBJECTIVE IMPAIRMENTS: Abnormal gait, decreased activity tolerance, decreased coordination, decreased endurance, difficulty walking, decreased ROM, decreased strength, increased fascial restrictions, increased muscle spasms, impaired tone, postural dysfunction, and pain.   ACTIVITY LIMITATIONS: sitting, standing, continence, toileting, and locomotion level  PARTICIPATION LIMITATIONS: shopping and community activity  PERSONAL FACTORS: Fitness, Time since onset of injury/illness/exacerbation, and 3+ comorbidities: Chron's disease of ilium; short bowel syndrome; chronic posterior anal fissure;, small bowel anastomosis's resection x 2, mesh removal x2; ileostomy closure  are also affecting patient's functional outcome.   REHAB POTENTIAL: Excellent  CLINICAL DECISION MAKING: Evolving/moderate complexity  EVALUATION COMPLEXITY: Moderate   GOALS: Goals reviewed with patient? Yes  SHORT TERM GOALS: Target date: 06/15/23  Patient independent with initial HEP for strengthening and flexibility.  Baseline: Goal status: MET 07/17/23  2.  Patient understands how to perform scar massage to lengthen the tissue.  Baseline:  Goal status: MET 07/17/23  3.  Patient  reports his abdominal pain decreased >/= 25 % due to reduction of restrictions.  Baseline:  Goal status: In Progress  4.  Patient is able to contract his abdominals with not lifting his rib cage.  Baseline:  Goal status: In Progress  5.  Patient has increased lumbar extension by 25% due to increased lumbar ROM and length of his abdominals.  Baseline:  Goal status:MET 07/17/23  6.  Patient able to do the 6 minute walk test with exertion </= 2/10.  Baseline:  Goal status: In progress  LONG TERM GOALS: Target date: 07/13/23  Patient independent with advanced HEP for strength, flexibility, and endurance.  Baseline:  Goal status: INITIAL  2.  Patient pelvic floor strength >/= 3/5 holding for 40 seconds so his fecal leakage decreased >/= 75%.  Baseline:  Goal status: INITIAL  3.  Patient is able to walk >/= 1729 feet in 6 minutes with 0-1 exertion due to increased strength and rib mobility.  Baseline:  Goal status: INITIAL  4.  Patient is able to stand upright due to increased length of the anterior trunk and lumbar extension improve by 50%.  Baseline:  Goal status: INITIAL  5.  Patient has reduction of pelvic pain decreased >/= 50% due to improve abdominal strength and elongation.  Baseline:  Goal status: INITIAL   PLAN:  PT FREQUENCY: 2x/week  PT DURATION: 8 weeks  PLANNED INTERVENTIONS: 97110-Therapeutic exercises, 97530- Therapeutic activity, [id number]- Neuromuscular re-education, 97140- Manual therapy, [id number]- Gait training, 09811- Ultrasound, Patient/Family education, Balance training, Dry Needling, Joint manipulation, Spinal mobilization, Scar mobilization, Cryotherapy, Moist heat, and Biofeedback  PLAN FOR NEXT SESSION:  We will do 1-2 times per week.  Continue manual work to abdomen, dry needling to scars if indicated, endurance training, hip and back extensor strength, nustep,  Pelvic floor: manual work to pelvic floor, abdominal work, 6 minute walk test,see if he got  the wand.  Opening up of the lower rib cage and abdomen to improve diaphragmatic breathing and relaxation of the pelvic floor. Back extensor strength.    Eulis Foster, PT 08/08/23 3:24 PM

## 2023-08-08 NOTE — Transitions of Care (Post Inpatient/ED Visit) (Signed)
   08/08/2023  Name: [name] MRN: [id number] DOB: 1950/08/04  Today's TOC FU Call Status: Today's TOC FU Call Status:: Unsuccessful Call (1st Attempt) Unsuccessful Call (1st Attempt) Date: 08/08/23  Attempted to reach the patient regarding the most recent Inpatient/ED visit.  Follow Up Plan: Additional outreach attempts will be made to reach the patient to complete the Transitions of Care (Post Inpatient/ED visit) call.   Signature Karena Addison, LPN [facility] Nurse Health Advisor Direct Dial [phone]

## 2023-08-12 ENCOUNTER — Other Ambulatory Visit: Payer: Self-pay | Admitting: Family Medicine

## 2023-08-13 ENCOUNTER — Ambulatory Visit (INDEPENDENT_AMBULATORY_CARE_PROVIDER_SITE_OTHER): Payer: Medicare Other | Admitting: Family Medicine

## 2023-08-13 ENCOUNTER — Encounter: Payer: Self-pay | Admitting: Family Medicine

## 2023-08-13 VITALS — BP 102/68 | HR 61 | Temp 98.3°F | Wt 158.8 lb

## 2023-08-13 DIAGNOSIS — R400 Somnolence: Secondary | ICD-10-CM

## 2023-08-13 DIAGNOSIS — R413 Other amnesia: Secondary | ICD-10-CM | POA: Diagnosis not present

## 2023-08-13 DIAGNOSIS — B349 Viral infection, unspecified: Secondary | ICD-10-CM | POA: Diagnosis not present

## 2023-08-13 MED ORDER — TRAZODONE HCL 100 MG PO TABS: 100.0000 mg | ORAL_TABLET | Freq: Every day | ORAL | Status: DC

## 2023-08-13 MED ORDER — GABAPENTIN 100 MG PO CAPS: 100.0000 mg | ORAL_CAPSULE | Freq: Every day | ORAL | Status: DC

## 2023-08-13 NOTE — Progress Notes (Signed)
   Subjective:    Patient ID: [name], male    DOB: [birth year], 73 y.o.   MRN: [id number]  HPI Here for several issues. First he went to the ED on 08-07-23 for chills, body aches, and fatigue. He tested negative for RSV, flu, and Covid. His Wbc was normal at 7.6. His exam and CXR were normal. He was diagnosed with a vital illness, and sent home. He does feel better now with no aches or chills. The fatigue persists however, and says this has been going on for several months. He tries to get in plenty of calories, and he has kept his weight stable. He describes feeling very sleepy or groggy in the mornings. He currently takes Trazodone 200 mg, melatonin, and Gabapentin 100 mg at bedtime every night. He has been sleeping fairly well. The third issue is memory loss. He says he is more forgetful of things in the past 6 months. He often loses his train of thought and forgets what he is talking about. He can still drive without getting lost, but this requires much more attention than it used to. He loses things around the house. He is concerned that he may be developing dementia.    Review of Systems  Constitutional:  Positive for fatigue.  Respiratory: Negative.    Cardiovascular: Negative.   Gastrointestinal:  Positive for abdominal pain and diarrhea. Negative for blood in stool.  Genitourinary: Negative.   Neurological: Negative.   Psychiatric/Behavioral:  Negative for dysphoric mood and sleep disturbance. The patient is not nervous/anxious.        Objective:   Physical Exam Constitutional:      Appearance: Normal appearance.  Cardiovascular:     Rate and Rhythm: Normal rate and regular rhythm.     Pulses: Normal pulses.     Heart sounds: Normal heart sounds.  Pulmonary:     Effort: Pulmonary effort is normal.     Breath sounds: Normal breath sounds.  Neurological:     General: No focal deficit present.     Mental Status: He is alert and oriented to person, place, and time.   Psychiatric:        Mood and Affect: Mood normal.        Behavior: Behavior normal.        Thought Content: Thought content normal.           Assessment & Plan:  He has recovered from a viral illness. He has grogginess in the mornings which is likely related to medications. We will decrease the Trazodone to 100 mg at bedtime. We will refer him to Neurology for memory loss.  Gershon Crane, MD

## 2023-08-14 ENCOUNTER — Other Ambulatory Visit (HOSPITAL_COMMUNITY): Payer: Self-pay

## 2023-08-14 NOTE — Transitions of Care (Post Inpatient/ED Visit) (Signed)
   08/14/2023  Name: [name] MRN: [id number] DOB: 06/04/1951  Today's TOC FU Call Status: Today's TOC FU Call Status:: Unsuccessful Call (1st Attempt) Unsuccessful Call (1st Attempt) Date: 08/08/23  Attempted to reach the patient regarding the most recent Inpatient/ED visit.  Follow Up Plan: No further outreach attempts will be made at this time. We have been unable to contact the patient. Patient already seen in office Signature Karena Addison, LPN [facility] Nurse Health Advisor Direct Dial [phone]

## 2023-08-15 ENCOUNTER — Other Ambulatory Visit (HOSPITAL_COMMUNITY): Payer: Self-pay

## 2023-08-15 ENCOUNTER — Ambulatory Visit: Payer: Medicare Other | Admitting: Physical Therapy

## 2023-08-15 ENCOUNTER — Telehealth: Payer: Self-pay | Admitting: Pharmacy Technician

## 2023-08-15 ENCOUNTER — Encounter: Payer: Self-pay | Admitting: Physical Therapy

## 2023-08-15 DIAGNOSIS — G7102 Facioscapulohumeral muscular dystrophy: Secondary | ICD-10-CM | POA: Diagnosis not present

## 2023-08-15 DIAGNOSIS — R293 Abnormal posture: Secondary | ICD-10-CM

## 2023-08-15 DIAGNOSIS — R262 Difficulty in walking, not elsewhere classified: Secondary | ICD-10-CM

## 2023-08-15 DIAGNOSIS — R252 Cramp and spasm: Secondary | ICD-10-CM

## 2023-08-15 DIAGNOSIS — M6281 Muscle weakness (generalized): Secondary | ICD-10-CM

## 2023-08-15 DIAGNOSIS — R102 Pelvic and perineal pain: Secondary | ICD-10-CM

## 2023-08-15 DIAGNOSIS — R1084 Generalized abdominal pain: Secondary | ICD-10-CM

## 2023-08-15 DIAGNOSIS — L905 Scar conditions and fibrosis of skin: Secondary | ICD-10-CM

## 2023-08-15 NOTE — Telephone Encounter (Signed)
I have put the paperwork in Dr Marvell Fuller office to complete.

## 2023-08-15 NOTE — Therapy (Signed)
OUTPATIENT PHYSICAL THERAPY MALE PELVIC TREATMENT   Patient Name: [name] MRN: [id number] DOB:1951/05/27, 73 y.o., male Today's Date: 08/15/2023  END OF SESSION:  PT End of Session - 08/15/23 1531     Visit Number 9    Date for PT Re-Evaluation 09/07/23    Authorization Type 9    Authorization - Visit Number 9    Authorization - Number of Visits 10    PT Start Time 1445    PT Stop Time 1525    PT Time Calculation (min) 40 min    Activity Tolerance Patient tolerated treatment well    Behavior During Therapy [facility] for tasks assessed/performed              Past Medical History:  Diagnosis Date   Acute prostatitis 07/24/2007   Qualifier: Diagnosis of  By: Clent Ridges MD, Jeannett Senior A    Acute respiratory failure with hypoxia (HCC)    Allergy    mild   Arthritis    osteoarthritis   Aspiration pneumonia (HCC) 05/24/2022   Asthma    Blood transfusion without reported diagnosis    BPH (benign prostatic hypertrophy) with urinary obstruction    Crohn's ileitis (HCC) suspected 05/03/2017   Dilated aortic root (HCC)    noted on echo 08/2012   Diverticulitis of colon    Drug-induced acute pancreatitis without infection or necrosis - from 6 MP 09/24/2017   EPIDIDYMITIS 02/15/2010   Qualifier: Diagnosis of  By: Clent Ridges MD, Tera Mater    GERD (gastroesophageal reflux disease)    H/O: GI bleed    Hemorrhoids    Hepatitis 1975   unknown type    HERPES SIMPLEX INFECTION 10/14/2007   Qualifier: Diagnosis of  By: Clent Ridges MD, Jeannett Senior A    Hiatal hernia    Ileus following gastrointestinal surgery (HCC) 12/26/2011   Long term (current) use of systemic steroids 06/18/2018   Psoriasis    sees Dr. Karene Fry at [facility]   Rectal fissure    Recurrent ventral incisional hernia 05/10/2012   Shingles    Short bowel syndrome    SVT (supraventricular tachycardia) (HCC)    Ulcer 08/21/2013   ileal   Past Surgical History:  Procedure Laterality Date   BOWEL RESECTION  12/19/2011    Procedure: SMALL BOWEL RESECTION;  Surgeon: Mariella Saa, MD;  Location: [facility];  Service: General;  Laterality: N/A;  with anastamosis and insertion mesh   BRONCHOSCOPY     COLON SURGERY  01/2004   x 2   COLONOSCOPY W/ BIOPSIES  04/26/2017   per Dr. Leone Payor, no polyps, benign inflammation, repeat in 5 yrs    CYSTOSCOPY     ESOPHAGOGASTRODUODENOSCOPY     HEMICOLECTOMY     left side, at [facility], diverticulitis   HEMORRHOID BANDING     HERNIA REPAIR     [phone] incisional hernia   ILEOSTOMY     ILEOSTOMY CLOSURE     INSERTION OF MESH  07/31/2012   Procedure: INSERTION OF MESH;  Surgeon: Mariella Saa, MD;  Location: [facility];  Service: General;;   LAPAROTOMY  12/19/2011   Procedure: EXPLORATORY LAPAROTOMY;  Surgeon: Mariella Saa, MD;  Location: [facility];  Service: General;  Laterality: N/A;   PACEMAKER IMPLANT N/A 12/01/2020   Procedure: PACEMAKER IMPLANT;  Surgeon: Regan Lemming, MD;  Location: [facility];  Service: Cardiovascular;  Laterality: N/A;   PACEMAKER INSERTION Left    TONSILLECTOMY     UPPER GASTROINTESTINAL ENDOSCOPY  VASECTOMY     VENTRAL HERNIA REPAIR  07/31/2012   Procedure: HERNIA REPAIR VENTRAL ADULT;  Surgeon: Mariella Saa, MD;  Location: [facility];  Service: General;  Laterality: N/A;   Patient Active Problem List   Diagnosis Date Noted   Difficult intravenous access 06/25/2023   Generalized weakness 05/24/2022   Drug overdose, intentional, initial encounter (HCC) 05/24/2022   Chronic pain syndrome 05/24/2022   Chronic anemia    Chronic prostatitis 05/01/2022   Bladder calculi 05/01/2022   Dysuria    Pelvic floor dysfunction 04/27/2022   Crohn's disease of small intestine with other complication (HCC) 02/13/2022   Protein-calorie malnutrition, severe 01/05/2022   Pacemaker 01/03/2022   External hemorrhoid, thrombosed 05/27/2019   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   Long-term use of immunosuppressant medication   -Rinvoq 06/28/2017   Crohn's ileitis (HCC)  05/03/2017   Psoriasis 08/23/2016   Insomnia 04/22/2014   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/06/2012   SVT (supraventricular tachycardia) (HCC) 08/04/2012   Vitamin D deficiency 06/18/2012   Attention deficit hyperactivity disorder (ADHD) 05/28/2009   BPH (benign prostatic hyperplasia) 07/24/2007   Asthma 03/06/2007   GERD 03/06/2007   DIVERTICULITIS, HX OF 03/06/2007    PCP: [name], MD   REFERRING PROVIDER: Iva Boop, MD   REFERRING DIAG: R19.8 (ICD-10-CM) - Abdominal weakness   THERAPY DIAG:  Muscle weakness (generalized)  Cramp and spasm  Generalized abdominal pain  Scar adherent  Abnormal posture  Pelvic pain  Difficulty in walking, not elsewhere classified  Rationale for Evaluation and Treatment: Rehabilitation  ONSET DATE: 2005  SUBJECTIVE:                                                                                                                                                                                           SUBJECTIVE STATEMENT: Patient states that his pelvic floor has been having a hard time relaxing, even with using the wand. Patient reports that he is able to find trigger points with wand, but is unable to fully relax the trigger points. 8/10 pain today. Fluid intake: Yes: does not drink caffeine, water     PAIN:  Are you having pain? Yes: NPRS scale: 8/10 Pain location: abdomen Pain description: constant Aggravating factors: stress Relieving factors: treatment  PAIN:  Are you having pain? Yes NPRS scale: 5/10 Pain location: between anus and scrotum Pain type: dull and pressure,sharp Pain description: constant more at night  Aggravating factors: sitting, urinating, bowel movement, waking up in the morning.  Relieving factors: walking, moving  PRECAUTIONS: ICD/Pacemaker  RED FLAGS: None   WEIGHT BEARING RESTRICTIONS: No  FALLS:  Has patient fallen in last 6 months?  Yes. Number of falls water main cover was not put back properly and tripped on it and recovered so not due to balance  LIVING ENVIRONMENT: Lives with: lives with their spouse   OCCUPATION: retired  PLOF: Independent  PATIENT GOALS: build endurance back, strength of abdominal wall, reduction of pelvic pain  PERTINENT HISTORY:  Chron's disease of ilium; short bowel syndrome; chronic posterior anal fissure;, small bowel anastomosis's resection x 2, mesh removal x2; ileostomy closure  BOWEL MOVEMENT: Pain with bowel movement: No Type of bowel movement:Type (Bristol Stool Scale) Type 7, Frequency 5, Strain No, and Splinting no  goes multiple times due to short bowel syndrome Fully empty rectum: Yes:   Leakage: Yes: sometimes Pads: Yes: diaper Fiber supplement: No he is not suppose to  URINATION: Pain with urination: Yes Fully empty bladder: Yes:   Stream: Strong Urgency: Yes: sometimes Frequency: every 2 hours Leakage:  none   INTERCOURSE:Not active     OBJECTIVE:  Note: Objective measures were completed at Evaluation unless otherwise noted.   COGNITION: Overall cognitive status: Within functional limits for tasks assessed       FUNCTIONAL TESTS:  6 minute walk test: exertion 5 for 1601 feet  GAIT: Distance walked: 6 minutes walked 1601 feet with exertion of 5 moderate Assistive device utilized: None Level of assistance: Complete Independence Comments: walks with decreased trunk extension, decreased hip extension 08/01/23.  1304 feet  with exertion scale is 6  POSTURE: rounded shoulders, forward head, decreased lumbar lordosis, increased thoracic kyphosis, posterior pelvic tilt, and flexed trunk   PELVIC ALIGNMENT: ASIS is equal  LUMBARAROM/PROM:  A/PROM A/PROM  eval 07/25/23  Flexion full full  Extension 100% limitation Decreased by 75%  Right lateral flexion Decreased by 25% Decreased by 25%  Left lateral flexion Decreased by 25% Decreased by 25%  Right  rotation Decreased by 25% Decreased by 25%  Left rotation Decreased by 25% Decreased by 25%   (Blank rows = not tested)    LOWER EXTREMITY MMT:  MMT Right eval Left eval Right 07/25/23 Left  07/25/23  Hip extension 3/5 3/5 4/5 4/5  Hip abduction 4/5 3+/5 4/5 4/5   PALPATION:   General  rib cage is restricted, multiple abdominal scars that are restricted. Unable to tighten his stomach and will lift up his rib cage instead.                 External Perineal Exam ischiocavernosus, bulbocavernosus,                              Internal Pelvic Floor tenderness located in bilateral levator ani, obturator internist, coccygeus  Patient confirms identification and approves PT to assess internal pelvic floor and treatment Yes  PELVIC MMT:   MMT eval  Internal Anal Sphincter 1/5  External Anal Sphincter 1/5  Puborectalis 2/5  (Blank rows = not tested)        TONE: increased   TODAY'S TREATMENT:    Manual: Soft tissue mobilization: To assess for dry needling  Manual work to bilateral ischiocavernosus , right puborectalis, along the iliococcygeus to lengthen after dry needling Internal pelvic floor techniques:  No emotional/communication barriers or cognitive limitation. Patient is motivated to learn. Patient understands and agrees with treatment goals and plan. PT explains patient will be examined in standing, sitting, and lying down to see how their muscles and joints work. When they are  ready, they will be asked to remove their underwear so PT can examine their perineum. The patient is also given the option of providing their own chaperone as one is not provided in our facility. The patient also has the right and is explained the right to defer or refuse any part of the evaluation or treatment including the internal exam. With the patient's consent, PT will use one gloved finger to gently assess the muscles of the pelvic floor, seeing how well it contracts and relaxes and if there is  muscle symmetry. After, the patient will get dressed and PT and patient will discuss exam findings and plan of care. PT and patient discuss plan of care, schedule, attendance policy and HEP activities. Therapist finger going through rectal canal to address lengthening of Right sided pelvic floor muscles using diaphragmatic breathing and lengthening techniques Trigger Point Dry Needling Initial Treatment: Pt instructed on Dry Needling rational, procedures, and possible side effects. Pt instructed to expect mild to moderate muscle soreness later in the day and/or into the next day.  Pt instructed in methods to reduce muscle soreness. Pt instructed to continue prescribed HEP. Because Dry Needling was performed over or adjacent to a lung field, pt was educated on S/S of pneumothorax and to seek immediate medical attention should they occur.  Patient was educated on signs and symptoms of infection and other risk factors and advised to seek medical attention should they occur.  Patient verbalized understanding of these instructions and education.   Patient Verbal Consent Given: Yes Education Handout Provided: Yes Muscles Treated: R side superior transverse perineum, ischiocavernosus, puborectalis, iliococcygeus  Electrical Stimulation Performed: No Treatment Response/Outcome: elongation of muscle and trigger point response Trigger Point Dry Needling  What is Trigger Point Dry Needling (DN)? DN is a physical therapy technique used to treat muscle pain and dysfunction. Specifically, DN helps deactivate muscle trigger points (muscle knots).  A thin filiform needle is used to penetrate the skin and stimulate the underlying trigger point. The goal is for a local twitch response (LTR) to occur and for the trigger point to relax. No medication of any kind is injected during the procedure.   What Does Trigger Point Dry Needling Feel Like?  The procedure feels different for each individual patient. Some  patients report that they do not actually feel the needle enter the skin and overall the process is not painful. Very mild bleeding may occur. However, many patients feel a deep cramping in the muscle in which the needle was inserted. This is the local twitch response.   How Will I feel after the treatment? Soreness is normal, and the onset of soreness may not occur for a few hours. Typically this soreness does not last longer than two days.  Bruising is uncommon, however; ice can be used to decrease any possible bruising.  In rare cases feeling tired or nauseous after the treatment is normal. In addition, your symptoms may get worse before they get better, this period will typically not last longer than 24 hours.   What Can I do After My Treatment? Increase your hydration by drinking more water for the next 24 hours.  You may place ice or heat on the areas treated that have become sore, however, do not use heat on inflamed or bruised areas. Heat often brings more relief post needling. You can continue your regular activities, but vigorous activity is not recommended initially after the treatment for 24 hours. DN is best combined with other physical therapy  such as strengthening, stretching, and other therapies.   What are the complications? While your therapist has had extensive training in minimizing the risks of trigger point dry needling, it is important to understand the risks of any procedure.  Risks include bleeding, pain, fatigue, hematoma, infection, vertigo, nausea or nerve involvement. Monitor for any changes to your skin or sensation. Contact your therapist or MD with concerns.  A rare but serious complication is a pneumothorax over or near your middle and upper chest and back If you have dry needling in this area, monitor for the following symptoms: Shortness of breath on exertion and/or Difficulty taking a deep breath and/or Chest Pain and/or A dry cough If any of the above  symptoms develop, please go to the nearest emergency room or call 911. Tell them you had dry needling over your thorax and report any symptoms you are having. Please follow-up with your treating therapist after you complete the medical evaluation. Exercises: Strengthening: Walk 6 minutes 1346 feet with exertion scale is 6 and walked with his dog at the same time.   08/08/23 Manual: Soft tissue mobilization: Manual work to the diaphragm to lengthen the tissue Scar tissue mobilization: Lifting up the scar and mobilizing the scar in different directions to release the restrictions throughout the abdomen Myofascial release: Tissue rolling of the abdomen, around the diaphragm and lower rib cage Spinal mobilization: As the patient breaths out the therapist assist the rib cage to go downward in supine Exercises: Stretches/mobility: Educated patient on how to use the pelvic wand rectally on the male pelvic floor model Marjo Bicker pose with diaphragmatic breathing into the back and expand the lower rib cage and the diaphragm Laying on side with ball under the rib cage to breath into the ball and open up the side of the ribcage and lungs Laying prone with breathing and focusing on expanding his abdomen into the mat to open up the abdominals Strengthening: Walk 6 minutes 1187 feet  with exertion scale is 5 and walked with his dog at the same time.     08/01/23 Manual: Soft tissue mobilization: Patient laying on side with therapist performing manual work to the levator ani, obturator internist, along the ischiococcygeus  Internal pelvic floor techniques: No emotional/communication barriers or cognitive limitation. Patient is motivated to learn. Patient understands and agrees with treatment goals and plan. PT explains patient will be examined in standing, sitting, and lying down to see how their muscles and joints work. When they are ready, they will be asked to remove their underwear so PT can examine  their perineum. The patient is also given the option of providing their own chaperone as one is not provided in our facility. The patient also has the right and is explained the right to defer or refuse any part of the evaluation or treatment including the internal exam. With the patient's consent, PT will use one gloved finger to gently assess the muscles of the pelvic floor, seeing how well it contracts and relaxes and if there is muscle symmetry. After, the patient will get dressed and PT and patient will discuss exam findings and plan of care. PT and patient discuss plan of care, schedule, attendance policy and HEP activities.  Going through the rectum with patient laying on the right side performing manual work to the levator ani,  obturator internist, along the puborectalis while monitoring for pain and tissue reaction Exercises: Stretches/mobility: Educated patient on device he can purchase to use on the pelvic floor muscles  going through the rectum Strengthening: Walk 6 minutes 1304 feet  with exertion scale is 6     07/25/23 Manual: Soft tissue mobilization: Manual work to the perineal body, along the pubic rami, levator ani, obturator internist, and ischiocavernous, and bulbospongiosus to elongate  Educated patient on how to perform perineal massage at home to relax the muscles. Exercises: Stretches/mobility: Happy baby holding 1 minute supine Supine piriformis stretch holding 30 sec Supine trunk rotation pulling leg across body holding 30 sec bil.  Open book stretch in sidely holding 30 sec 2 times bil.     PATIENT EDUCATION: 08/15/23 Education details: Access Code: [email or web address], educated patient on perineal massage and sent you tube video, information of dry needling  Person educated: Patient Education method: Explanation, Demonstration, Tactile cues, Verbal cues, and Handouts Education comprehension: verbalized understanding, returned demonstration, verbal cues required, tactile cues  required, and needs further education    HOME EXERCISE PROGRAM: 08/08/23 Access Code: [email or web address] URL: [email or web address] Date: 08/08/2023 Prepared by: Eulis Foster  Exercises - Supine Figure 4 Piriformis Stretch  - 1 x daily - 7 x weekly - 1 sets - 2 reps - 30 sec hold - Supine Piriformis Stretch with Leg Straight  - 1 x daily - 7 x weekly - 1 sets - 2 reps - 30 sec hold - Sidelying Thoracic Rotation with Open Book  - 1 x daily - 7 x weekly - 1 sets - 2 reps - 30 sec hold - Prone Diaphragmatic Breathing  - 1 x daily - 7 x weekly - 3 sets - 10 reps - Diaphragmatic Breathing in Child's Pose with Pelvic Floor Relaxation  - 1 x daily - 7 x weekly - 3 sets - 10 reps - Sidelying Diaphragmatic Breathing  - 1 x daily - 7 x weekly - 3 sets - 10 reps  ASSESSMENT:  CLINICAL IMPRESSION: Patient arrived to PT with 8/10 pelvic floor pain and did 6 minute walk test with 1346 feet with exertion scale of 6. Dry needling of superior transverse perineum and ischiocavernosus was painful but relieving to the patient, and he could feel the musculature releasing during treatment today. Puborectalis needling was less painful than previous muscle groups mentioned. All needling today reproduced twitch responses locally. Internal manual interventions included R sided pelvic floor work and patient found this uncomfortable. Manual interventions to ischiocavernosus decreased the patient's pain to a 4/10 by the end of today's session. Patient will continue to utilize wand at home. Patient will benefit from skilled therapy to improve overall endurance, posture, strength and reduce his pain.   OBJECTIVE IMPAIRMENTS: Abnormal gait, decreased activity tolerance, decreased coordination, decreased endurance, difficulty walking, decreased ROM, decreased strength, increased fascial restrictions, increased muscle spasms, impaired tone, postural dysfunction, and pain.   ACTIVITY LIMITATIONS: sitting, standing,  continence, toileting, and locomotion level  PARTICIPATION LIMITATIONS: shopping and community activity  PERSONAL FACTORS: Fitness, Time since onset of injury/illness/exacerbation, and 3+ comorbidities: Chron's disease of ilium; short bowel syndrome; chronic posterior anal fissure;, small bowel anastomosis's resection x 2, mesh removal x2; ileostomy closure  are also affecting patient's functional outcome.   REHAB POTENTIAL: Excellent  CLINICAL DECISION MAKING: Evolving/moderate complexity  EVALUATION COMPLEXITY: Moderate   GOALS: Goals reviewed with patient? Yes  SHORT TERM GOALS: Target date: 06/15/23  Patient independent with initial HEP for strengthening and flexibility.  Baseline: Goal status: MET 07/17/23  2.  Patient understands how to perform scar massage to lengthen the tissue.  Baseline:  Goal status: MET 07/17/23  3.  Patient reports his abdominal pain decreased >/= 25 % due to reduction of restrictions.  Baseline:  Goal status: In Progress  4.  Patient is able to contract his abdominals with not lifting his rib cage.  Baseline:  Goal status: In Progress  5.  Patient has increased lumbar extension by 25% due to increased lumbar ROM and length of his abdominals.  Baseline:  Goal status:MET 07/17/23  6.  Patient able to do the 6 minute walk test with exertion </= 2/10.  Baseline:  Goal status: In progress  LONG TERM GOALS: Target date: 07/13/23  Patient independent with advanced HEP for strength, flexibility, and endurance.  Baseline:  Goal status: INITIAL  2.  Patient pelvic floor strength >/= 3/5 holding for 40 seconds so his fecal leakage decreased >/= 75%.  Baseline:  Goal status: INITIAL  3.  Patient is able to walk >/= 1729 feet in 6 minutes with 0-1 exertion due to increased strength and rib mobility.  Baseline:  Goal status: INITIAL  4.  Patient is able to stand upright due to increased length of the anterior trunk and lumbar extension improve  by 50%.  Baseline:  Goal status: INITIAL  5.  Patient has reduction of pelvic pain decreased >/= 50% due to improve abdominal strength and elongation.  Baseline:  Goal status: INITIAL   PLAN:  PT FREQUENCY: 2x/week  PT DURATION: 8 weeks  PLANNED INTERVENTIONS: 97110-Therapeutic exercises, 97530- Therapeutic activity, 97112- Neuromuscular re-education, 97140- Manual therapy, 97116- Gait training, 16109- Ultrasound, Patient/Family education, Balance training, Dry Needling, Joint manipulation, Spinal mobilization, Scar mobilization, Cryotherapy, Moist heat, and Biofeedback  PLAN FOR NEXT SESSION:  Pelvic floor: manual work to pelvic floor (left side), abdominal work, 6 minute walk test,see if he got the wand.  Opening up of the lower rib cage and abdomen to improve diaphragmatic breathing and relaxation of the pelvic floor. Back extensor strength. 10th visit note    Eulis Foster, PT 08/15/23 3:32 PM

## 2023-08-17 NOTE — Telephone Encounter (Signed)
Form competed and faxed back to [phone].

## 2023-08-20 ENCOUNTER — Other Ambulatory Visit: Payer: Self-pay | Admitting: Family Medicine

## 2023-08-20 ENCOUNTER — Telehealth: Payer: Self-pay | Admitting: Internal Medicine

## 2023-08-20 MED ORDER — CHOLESTYRAMINE 4 G PO PACK: 4.0000 g | PACK | Freq: Every day | ORAL | 3 refills | Status: DC

## 2023-08-20 NOTE — Telephone Encounter (Signed)
Patient reports improvement but not resolution of diarrhea after Xifaxan Tx  Some days 2-3 semi-fromed stools but 8 stools other days.  Consistency overall less loose - more formed   Meds ordered this encounter  Medications   cholestyramine (QUESTRAN) 4 g packet    Sig: Take 1 packet (4 g total) by mouth daily with lunch.    Dispense:  90 packet    Refill:  3

## 2023-08-21 MED ORDER — FINASTERIDE 5 MG PO TABS: 5.0000 mg | ORAL_TABLET | Freq: Every day | ORAL | 3 refills | Status: DC

## 2023-08-22 ENCOUNTER — Encounter: Payer: Self-pay | Admitting: Physician Assistant

## 2023-08-22 ENCOUNTER — Encounter: Payer: Self-pay | Admitting: Family Medicine

## 2023-08-24 NOTE — Telephone Encounter (Signed)
 Called patient an left a VM patient is aware

## 2023-08-27 ENCOUNTER — Ambulatory Visit: Payer: Medicare Other | Attending: Internal Medicine | Admitting: Physical Therapy

## 2023-08-27 ENCOUNTER — Encounter: Payer: Self-pay | Admitting: Physical Therapy

## 2023-08-27 DIAGNOSIS — M6281 Muscle weakness (generalized): Secondary | ICD-10-CM | POA: Diagnosis present

## 2023-08-27 DIAGNOSIS — R1084 Generalized abdominal pain: Secondary | ICD-10-CM | POA: Insufficient documentation

## 2023-08-27 DIAGNOSIS — R293 Abnormal posture: Secondary | ICD-10-CM | POA: Diagnosis present

## 2023-08-27 DIAGNOSIS — L905 Scar conditions and fibrosis of skin: Secondary | ICD-10-CM | POA: Diagnosis present

## 2023-08-27 DIAGNOSIS — R262 Difficulty in walking, not elsewhere classified: Secondary | ICD-10-CM | POA: Diagnosis present

## 2023-08-27 DIAGNOSIS — R252 Cramp and spasm: Secondary | ICD-10-CM | POA: Insufficient documentation

## 2023-08-27 DIAGNOSIS — R102 Pelvic and perineal pain: Secondary | ICD-10-CM | POA: Insufficient documentation

## 2023-08-27 NOTE — Therapy (Signed)
 OUTPATIENT PHYSICAL THERAPY MALE PELVIC TREATMENT   Patient Name: [name] MRN: [id number] DOB:07/01/1951, 73 y.o., male Today's Date: 08/27/2023 Progress Note Reporting Period 05/18/23 to 08/27/23  See note below for Objective Data and Assessment of Progress/Goals.     END OF SESSION:  PT End of Session - 08/27/23 1451     Visit Number 10    Date for PT Re-Evaluation 09/07/23    Authorization Type 10    Authorization - Visit Number 10    Authorization - Number of Visits 20    PT Start Time 1445    PT Stop Time 1525    PT Time Calculation (min) 40 min    Activity Tolerance Patient tolerated treatment well    Behavior During Therapy [facility] for tasks assessed/performed              Past Medical History:  Diagnosis Date   Acute prostatitis 07/24/2007   Qualifier: Diagnosis of  By: Alyne Babinski MD, Mara Seminole A    Acute respiratory failure with hypoxia (HCC)    Allergy    mild   Arthritis    osteoarthritis   Aspiration pneumonia (HCC) 05/24/2022   Asthma    Blood transfusion without reported diagnosis    BPH (benign prostatic hypertrophy) with urinary obstruction    Crohn's ileitis (HCC) suspected 05/03/2017   Dilated aortic root (HCC)    noted on echo 08/2012   Diverticulitis of colon    Drug-induced acute pancreatitis without infection or necrosis - from 6 MP 09/24/2017   EPIDIDYMITIS 02/15/2010   Qualifier: Diagnosis of  By: Alyne Babinski MD, Lenis Quin    GERD (gastroesophageal reflux disease)    H/O: GI bleed    Hemorrhoids    Hepatitis 1975   unknown type    HERPES SIMPLEX INFECTION 10/14/2007   Qualifier: Diagnosis of  By: Alyne Babinski MD, Mara Seminole A    Hiatal hernia    Ileus following gastrointestinal surgery (HCC) 12/26/2011   Long term (current) use of systemic steroids 06/18/2018   Psoriasis    sees Dr. Jaclynn Mast at [facility].   Rectal fissure    Recurrent ventral incisional hernia 05/10/2012   Shingles    Short bowel syndrome    SVT (supraventricular tachycardia)  (HCC)    Ulcer 08/21/2013   ileal   Past Surgical History:  Procedure Laterality Date   BOWEL RESECTION  12/19/2011   Procedure: SMALL BOWEL RESECTION;  Surgeon: Quitman Bucy, MD;  Location: [facility];  Service: General;  Laterality: N/A;  with anastamosis and insertion mesh   BRONCHOSCOPY     COLON SURGERY  01/2004   x 2   COLONOSCOPY W/ BIOPSIES  04/26/2017   per Dr. Willy Harvest, no polyps, benign inflammation, repeat in 5 yrs    CYSTOSCOPY     ESOPHAGOGASTRODUODENOSCOPY     HEMICOLECTOMY     left side, at [facility], diverticulitis   HEMORRHOID BANDING     HERNIA REPAIR     [phone] incisional hernia   ILEOSTOMY     ILEOSTOMY CLOSURE     INSERTION OF MESH  07/31/2012   Procedure: INSERTION OF MESH;  Surgeon: Quitman Bucy, MD;  Location: [facility];  Service: General;;   LAPAROTOMY  12/19/2011   Procedure: EXPLORATORY LAPAROTOMY;  Surgeon: Quitman Bucy, MD;  Location: [facility];  Service: General;  Laterality: N/A;   PACEMAKER IMPLANT N/A 12/01/2020   Procedure: PACEMAKER IMPLANT;  Surgeon: Lei Pump, MD;  Location: [facility];  Service: Cardiovascular;  Laterality: N/A;   PACEMAKER INSERTION Left    TONSILLECTOMY     UPPER GASTROINTESTINAL ENDOSCOPY     VASECTOMY     VENTRAL HERNIA REPAIR  07/31/2012   Procedure: HERNIA REPAIR VENTRAL ADULT;  Surgeon: Quitman Bucy, MD;  Location: [facility];  Service: General;  Laterality: N/A;   Patient Active Problem List   Diagnosis Date Noted   Difficult intravenous access 06/25/2023   Generalized weakness 05/24/2022   Drug overdose, intentional, initial encounter (HCC) 05/24/2022   Chronic pain syndrome 05/24/2022   Chronic anemia    Chronic prostatitis 05/01/2022   Bladder calculi 05/01/2022   Dysuria    Pelvic floor dysfunction 04/27/2022   Crohn's disease of small intestine with other complication (HCC) 02/13/2022   Protein-calorie malnutrition, severe 01/05/2022   Pacemaker 01/03/2022   External  hemorrhoid, thrombosed 05/27/2019   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   Long-term use of immunosuppressant medication  -Rinvoq  06/28/2017   Crohn's ileitis (HCC)  05/03/2017   Psoriasis 08/23/2016   Insomnia 04/22/2014   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/06/2012   SVT (supraventricular tachycardia) (HCC) 08/04/2012   Vitamin D  deficiency 06/18/2012   Attention deficit hyperactivity disorder (ADHD) 05/28/2009   BPH (benign prostatic hyperplasia) 07/24/2007   Asthma 03/06/2007   GERD 03/06/2007   DIVERTICULITIS, HX OF 03/06/2007    PCP: Donley Furth, MD   REFERRING PROVIDER: Kenney Peacemaker, MD   REFERRING DIAG: R19.8 (ICD-10-CM) - Abdominal weakness   THERAPY DIAG:  Muscle weakness (generalized)  Cramp and spasm  Generalized abdominal pain  Scar adherent  Abnormal posture  Pelvic pain  Difficulty in walking, not elsewhere classified  Rationale for Evaluation and Treatment: Rehabilitation  ONSET DATE: 2005  SUBJECTIVE:                                                                                                                                                                                           SUBJECTIVE STATEMENT: The dry needling was helpful and did relieve  pressure.  Fluid intake: Yes: does not drink caffeine, water      PAIN:  08/27/23 Are you having pain? Yes: NPRS scale: 5/10 Pain location: abdomen Pain description: constant Aggravating factors: stress Relieving factors: treatment  PAIN:  Are you having pain? Yes NPRS scale: 6/10; 08/27/23 Pain location: between anus and scrotum Pain type: dull and pressure,sharp Pain description: constant more at night  Aggravating factors: sitting, urinating, bowel movement, waking up in the morning.  Relieving factors: walking, moving  PRECAUTIONS: ICD/Pacemaker  RED FLAGS: None   WEIGHT BEARING RESTRICTIONS: No  FALLS:  Has patient fallen in  last 6 months? Yes. Number of falls water   main cover was not put back properly and tripped on it and recovered so not due to balance  LIVING ENVIRONMENT: Lives with: lives with their spouse   OCCUPATION: retired  PLOF: Independent  PATIENT GOALS: build endurance back, strength of abdominal wall, reduction of pelvic pain  PERTINENT HISTORY:  Chron's disease of ilium; short bowel syndrome; chronic posterior anal fissure;, small bowel anastomosis's resection x 2, mesh removal x2; ileostomy closure  BOWEL MOVEMENT: Pain with bowel movement: No Type of bowel movement:Type (Bristol Stool Scale) Type 7, Frequency 5, Strain No, and Splinting no  goes multiple times due to short bowel syndrome Fully empty rectum: Yes:   Leakage: Yes: sometimes Pads: Yes: diaper Fiber supplement: No he is not suppose to  URINATION: Pain with urination: Yes Fully empty bladder: Yes:   Stream: Strong Urgency: Yes: sometimes Frequency: every 2 hours Leakage:  none   INTERCOURSE:Not active     OBJECTIVE:  Note: Objective measures were completed at Evaluation unless otherwise noted.   COGNITION: Overall cognitive status: Within functional limits for tasks assessed       FUNCTIONAL TESTS:  6 minute walk test: exertion 5 for 1601 feet  GAIT: Distance walked: 6 minutes walked 1601 feet with exertion of 5 moderate Assistive device utilized: None Level of assistance: Complete Independence Comments: walks with decreased trunk extension, decreased hip extension 08/01/23.  1304 feet  with exertion scale is 6  POSTURE: rounded shoulders, forward head, decreased lumbar lordosis, increased thoracic kyphosis, posterior pelvic tilt, and flexed trunk   PELVIC ALIGNMENT: ASIS is equal  LUMBARAROM/PROM:  A/PROM A/PROM  eval 07/25/23 08/27/23  Flexion full full full  Extension 100% limitation Decreased by 75% Decreased by 50%  Right lateral flexion Decreased by 25% Decreased by 25% Decreased by 25%  Left lateral flexion Decreased by 25%  Decreased by 25% full  Right rotation Decreased by 25% Decreased by 25% full  Left rotation Decreased by 25% Decreased by 25% Full but painful in right abdominal   (Blank rows = not tested)    LOWER EXTREMITY MMT:  MMT Right eval Left eval Right 07/25/23 Left  07/25/23 Right/left  08/27/23  Hip extension 3/5 3/5 4/5 4/5 4+/5  Hip abduction 4/5 3+/5 4/5 4/5 4/5   PALPATION:   General  rib cage is restricted, multiple abdominal scars that are restricted. Unable to tighten his stomach and will lift up his rib cage instead.                 External Perineal Exam ischiocavernosus, bulbocavernosus,                              Internal Pelvic Floor tenderness located in bilateral levator ani, obturator internist, coccygeus  Patient confirms identification and approves PT to assess internal pelvic floor and treatment Yes  PELVIC MMT:   MMT eval  Internal Anal Sphincter 1/5  External Anal Sphincter 1/5  Puborectalis 2/5  (Blank rows = not tested)        TONE: increased   TODAY'S TREATMENT:    08/27/23 Manual: Soft tissue mobilization: Manual work to the diaphragm to improve the movement of the tissue Scar tissue mobilization: Mobilization of the scar on the abdomen to increase mobility of tissue Tissue rolling of the scars to reduce the tightness Neuromuscular re-education: Core retraining: Contraction of the abdomen bringing the lower rib cage downward to make  a full contraction Down training: Diaphragmatic breathing lifting the hot pack on his abdomen, tactile cues to not elevate the chest and have the air go into the abdomen.  Exercises: Stretches/mobility: Laying prone on abdomen to increase trunk extension, and elongate the abdominal muscles   08/15/23 Manual: Soft tissue mobilization: To assess for dry needling  Manual work to bilateral ischiocavernosus , right puborectalis, along the iliococcygeus to lengthen after dry needling Internal pelvic floor techniques:   No emotional/communication barriers or cognitive limitation. Patient is motivated to learn. Patient understands and agrees with treatment goals and plan. PT explains patient will be examined in standing, sitting, and lying down to see how their muscles and joints work. When they are ready, they will be asked to remove their underwear so PT can examine their perineum. The patient is also given the option of providing their own chaperone as one is not provided in our facility. The patient also has the right and is explained the right to defer or refuse any part of the evaluation or treatment including the internal exam. With the patient's consent, PT will use one gloved finger to gently assess the muscles of the pelvic floor, seeing how well it contracts and relaxes and if there is muscle symmetry. After, the patient will get dressed and PT and patient will discuss exam findings and plan of care. PT and patient discuss plan of care, schedule, attendance policy and HEP activities. Therapist finger going through rectal canal to address lengthening of Right sided pelvic floor muscles using diaphragmatic breathing and lengthening techniques Trigger Point Dry Needling Initial Treatment: Pt instructed on Dry Needling rational, procedures, and possible side effects. Pt instructed to expect mild to moderate muscle soreness later in the day and/or into the next day.  Pt instructed in methods to reduce muscle soreness. Pt instructed to continue prescribed HEP. Because Dry Needling was performed over or adjacent to a lung field, pt was educated on S/S of pneumothorax and to seek immediate medical attention should they occur.  Patient was educated on signs and symptoms of infection and other risk factors and advised to seek medical attention should they occur.  Patient verbalized understanding of these instructions and education.   Patient Verbal Consent Given: Yes Education Handout Provided: Yes Muscles Treated: R  side superior transverse perineum, ischiocavernosus, puborectalis, iliococcygeus  Electrical Stimulation Performed: No Treatment Response/Outcome: elongation of muscle and trigger point response Trigger Point Dry Needling  What is Trigger Point Dry Needling (DN)? DN is a physical therapy technique used to treat muscle pain and dysfunction. Specifically, DN helps deactivate muscle trigger points (muscle knots).  A thin filiform needle is used to penetrate the skin and stimulate the underlying trigger point. The goal is for a local twitch response (LTR) to occur and for the trigger point to relax. No medication of any kind is injected during the procedure.   What Does Trigger Point Dry Needling Feel Like?  The procedure feels different for each individual patient. Some patients report that they do not actually feel the needle enter the skin and overall the process is not painful. Very mild bleeding may occur. However, many patients feel a deep cramping in the muscle in which the needle was inserted. This is the local twitch response.   How Will I feel after the treatment? Soreness is normal, and the onset of soreness may not occur for a few hours. Typically this soreness does not last longer than two days.  Bruising is uncommon, however; ice  can be used to decrease any possible bruising.  In rare cases feeling tired or nauseous after the treatment is normal. In addition, your symptoms may get worse before they get better, this period will typically not last longer than 24 hours.   What Can I do After My Treatment? Increase your hydration by drinking more water  for the next 24 hours.  You may place ice or heat on the areas treated that have become sore, however, do not use heat on inflamed or bruised areas. Heat often brings more relief post needling. You can continue your regular activities, but vigorous activity is not recommended initially after the treatment for 24 hours. DN is best combined  with other physical therapy such as strengthening, stretching, and other therapies.   What are the complications? While your therapist has had extensive training in minimizing the risks of trigger point dry needling, it is important to understand the risks of any procedure.  Risks include bleeding, pain, fatigue, hematoma, infection, vertigo, nausea or nerve involvement. Monitor for any changes to your skin or sensation. Contact your therapist or MD with concerns.  A rare but serious complication is a pneumothorax over or near your middle and upper chest and back If you have dry needling in this area, monitor for the following symptoms: Shortness of breath on exertion and/or Difficulty taking a deep breath and/or Chest Pain and/or A dry cough If any of the above symptoms develop, please go to the nearest emergency room or call 911. Tell them you had dry needling over your thorax and report any symptoms you are having. Please follow-up with your treating therapist after you complete the medical evaluation. Exercises: Strengthening: Walk 6 minutes 1346 feet with exertion scale is 6 and walked with his dog at the same time.   08/08/23 Manual: Soft tissue mobilization: Manual work to the diaphragm to lengthen the tissue Scar tissue mobilization: Lifting up the scar and mobilizing the scar in different directions to release the restrictions throughout the abdomen Myofascial release: Tissue rolling of the abdomen, around the diaphragm and lower rib cage Spinal mobilization: As the patient breaths out the therapist assist the rib cage to go downward in supine Exercises: Stretches/mobility: Educated patient on how to use the pelvic wand rectally on the male pelvic floor model Earline Glenn pose with diaphragmatic breathing into the back and expand the lower rib cage and the diaphragm Laying on side with ball under the rib cage to breath into the ball and open up the side of the ribcage and  lungs Laying prone with breathing and focusing on expanding his abdomen into the mat to open up the abdominals Strengthening: Walk 6 minutes 1187 feet  with exertion scale is 5 and walked with his dog at the same time.     08/01/23 Manual: Soft tissue mobilization: Patient laying on side with therapist performing manual work to the levator ani, obturator internist, along the ischiococcygeus  Internal pelvic floor techniques: No emotional/communication barriers or cognitive limitation. Patient is motivated to learn. Patient understands and agrees with treatment goals and plan. PT explains patient will be examined in standing, sitting, and lying down to see how their muscles and joints work. When they are ready, they will be asked to remove their underwear so PT can examine their perineum. The patient is also given the option of providing their own chaperone as one is not provided in our facility. The patient also has the right and is explained the right to defer or refuse  any part of the evaluation or treatment including the internal exam. With the patient's consent, PT will use one gloved finger to gently assess the muscles of the pelvic floor, seeing how well it contracts and relaxes and if there is muscle symmetry. After, the patient will get dressed and PT and patient will discuss exam findings and plan of care. PT and patient discuss plan of care, schedule, attendance policy and HEP activities.  Going through the rectum with patient laying on the right side performing manual work to the levator ani,  obturator internist, along the puborectalis while monitoring for pain and tissue reaction Exercises: Stretches/mobility: Educated patient on device he can purchase to use on the pelvic floor muscles going through the rectum Strengthening: Walk 6 minutes 1304 feet  with exertion scale is 6     07/25/23 Manual: Soft tissue mobilization: Manual work to the perineal body, along the pubic rami,  levator ani, obturator internist, and ischiocavernous, and bulbospongiosus to elongate  Educated patient on how to perform perineal massage at home to relax the muscles. Exercises: Stretches/mobility: Happy baby holding 1 minute supine Supine piriformis stretch holding 30 sec Supine trunk rotation pulling leg across body holding 30 sec bil.  Open book stretch in sidely holding 30 sec 2 times bil.     PATIENT EDUCATION: 08/15/23 Education details: Access Code: [email or web address], educated patient on perineal massage and sent you tube video, information of dry needling  Person educated: Patient Education method: Explanation, Demonstration, Tactile cues, Verbal cues, and Handouts Education comprehension: verbalized understanding, returned demonstration, verbal cues required, tactile cues required, and needs further education    HOME EXERCISE PROGRAM: 08/08/23 Access Code: [email or web address] URL: [email or web address] Date: 08/08/2023 Prepared by: Marsha Skeen  Exercises - Supine Figure 4 Piriformis Stretch  - 1 x daily - 7 x weekly - 1 sets - 2 reps - 30 sec hold - Supine Piriformis Stretch with Leg Straight  - 1 x daily - 7 x weekly - 1 sets - 2 reps - 30 sec hold - Sidelying Thoracic Rotation with Open Book  - 1 x daily - 7 x weekly - 1 sets - 2 reps - 30 sec hold - Prone Diaphragmatic Breathing  - 1 x daily - 7 x weekly - 3 sets - 10 reps - Diaphragmatic Breathing in Child's Pose with Pelvic Floor Relaxation  - 1 x daily - 7 x weekly - 3 sets - 10 reps - Sidelying Diaphragmatic Breathing  - 1 x daily - 7 x weekly - 3 sets - 10 reps  ASSESSMENT:  CLINICAL IMPRESSION: Patient arrived to PT with 8/10 pelvic floor pain and did 6 minute walk test with 1346 feet with exertion scale of 6. Patient reports his pelvic pain is 25% better. Lumbar extension improved by 25%. His pain decreased from 5/10 to 4/10. He has increased strength of the hips. He still has some difficulty filling his abdomen  with diaphragmatic breathing. He has tightness in the upper abdomen and the abdominal scars.  He is able to contract his abdominals without lifting his rib cage. Patient will benefit from skilled therapy to improve overall endurance, posture, strength and reduce his pain.   OBJECTIVE IMPAIRMENTS: Abnormal gait, decreased activity tolerance, decreased coordination, decreased endurance, difficulty walking, decreased ROM, decreased strength, increased fascial restrictions, increased muscle spasms, impaired tone, postural dysfunction, and pain.   ACTIVITY LIMITATIONS: sitting, standing, continence, toileting, and locomotion level  PARTICIPATION LIMITATIONS: shopping and community activity  PERSONAL FACTORS: Fitness, Time since onset of  injury/illness/exacerbation, and 3+ comorbidities: Chron's disease of ilium; short bowel syndrome; chronic posterior anal fissure;, small bowel anastomosis's resection x 2, mesh removal x2; ileostomy closure  are also affecting patient's functional outcome.   REHAB POTENTIAL: Excellent  CLINICAL DECISION MAKING: Evolving/moderate complexity  EVALUATION COMPLEXITY: Moderate   GOALS: Goals reviewed with patient? Yes  SHORT TERM GOALS: Target date: 06/15/23  Patient independent with initial HEP for strengthening and flexibility.  Baseline: Goal status: MET 07/17/23  2.  Patient understands how to perform scar massage to lengthen the tissue.  Baseline:  Goal status: MET 07/17/23  3.  Patient reports his abdominal pain decreased >/= 25 % due to reduction of restrictions.  Baseline:  Goal status: Met 08/27/23  4.  Patient is able to contract his abdominals with not lifting his rib cage.  Baseline:  Goal status: Met 08/27/23  5.  Patient has increased lumbar extension by 25% due to increased lumbar ROM and length of his abdominals.  Baseline:  Goal status:MET 07/17/23  6.  Patient able to do the 6 minute walk test with exertion </= 2/10.  Baseline:  Goal  status: In progress  LONG TERM GOALS: Target date: 07/13/23  Patient independent with advanced HEP for strength, flexibility, and endurance.  Baseline:  Goal status: INITIAL  2.  Patient pelvic floor strength >/= 3/5 holding for 40 seconds so his fecal leakage decreased >/= 75%.  Baseline:  Goal status: INITIAL  3.  Patient is able to walk >/= 1729 feet in 6 minutes with 0-1 exertion due to increased strength and rib mobility.  Baseline:  Goal status: INITIAL  4.  Patient is able to stand upright due to increased length of the anterior trunk and lumbar extension improve by 50%.  Baseline: improved by 25% Goal status: ongoing 08/27/23  5.  Patient has reduction of pelvic pain decreased >/= 50% due to improve abdominal strength and elongation.  Baseline: decreased by 25%  Goal status: ongoing 08/27/23   PLAN:  PT FREQUENCY: 2x/week  PT DURATION: 8 weeks  PLANNED INTERVENTIONS: 97110-Therapeutic exercises, 97530- Therapeutic activity, 97112- Neuromuscular re-education, 97140- Manual therapy, 97116- Gait training, 60454- Ultrasound, Patient/Family education, Balance training, Dry Needling, Joint manipulation, Spinal mobilization, Scar mobilization, Cryotherapy, Moist heat, and Biofeedback  PLAN FOR NEXT SESSION:  Pelvic floor: manual work to pelvic floor (left side), abdominal work, 6 minute walk test, Back extensor strength.Renewal note    Marsha Skeen, PT 08/27/23 3:29 PM

## 2023-08-28 ENCOUNTER — Encounter: Payer: Self-pay | Admitting: Internal Medicine

## 2023-08-28 ENCOUNTER — Telehealth: Payer: Self-pay | Admitting: Internal Medicine

## 2023-08-28 NOTE — Telephone Encounter (Signed)
I left him a message to call back and ask for Viviann Spare, RN or Lois Huxley as they do the biologics.

## 2023-08-28 NOTE — Telephone Encounter (Signed)
Inbound call from patient stating requesting a call from [name] regarding his [employer]. Please advise.

## 2023-08-29 ENCOUNTER — Encounter: Payer: Self-pay | Admitting: Physical Therapy

## 2023-08-29 ENCOUNTER — Other Ambulatory Visit: Payer: Self-pay | Admitting: Family Medicine

## 2023-08-29 NOTE — Telephone Encounter (Signed)
Pt stated that [employer] was requesting additional information. Pt chart was reviewed and noted that [employer] assistance was previous faxed.  [employer] contacted and stated that they never received the fax.  Fax was resubmitted. Faxed to [employer]. Conformation page received. Pt made aware. Pt verbalized understanding with all questions answered.

## 2023-08-29 NOTE — Telephone Encounter (Unsigned)
Copied from CRM [id number]. Topic: Clinical - Medication Question >> Aug 29, 2023  1:44 PM Alona Bene A wrote: Reason for CRM: Patient called in regarding medication HYDROcodone-acetaminophen (NORCO) 5-325 MG tablet, patient is requesting call from nurse. Please contact patient to provide update on refill.

## 2023-08-30 MED ORDER — HYDROCODONE-ACETAMINOPHEN 5-325 MG PO TABS: 1.0000 | ORAL_TABLET | Freq: Three times a day (TID) | ORAL | 0 refills | Status: DC | PRN

## 2023-09-03 ENCOUNTER — Other Ambulatory Visit: Payer: Self-pay

## 2023-09-03 ENCOUNTER — Emergency Department (HOSPITAL_BASED_OUTPATIENT_CLINIC_OR_DEPARTMENT_OTHER): Payer: Medicare Other

## 2023-09-03 ENCOUNTER — Emergency Department (HOSPITAL_BASED_OUTPATIENT_CLINIC_OR_DEPARTMENT_OTHER)
Admission: EM | Admit: 2023-09-03 | Discharge: 2023-09-03 | Disposition: A | Discharge status: Home / Self Care | Payer: Medicare Other | Attending: Emergency Medicine | Admitting: Emergency Medicine

## 2023-09-03 ENCOUNTER — Ambulatory Visit: Payer: Medicare Other | Admitting: Physical Therapy

## 2023-09-03 ENCOUNTER — Ambulatory Visit: Payer: Self-pay | Admitting: Family Medicine

## 2023-09-03 ENCOUNTER — Encounter (HOSPITAL_BASED_OUTPATIENT_CLINIC_OR_DEPARTMENT_OTHER): Payer: Self-pay | Admitting: Emergency Medicine

## 2023-09-03 DIAGNOSIS — R1011 Right upper quadrant pain: Secondary | ICD-10-CM | POA: Diagnosis present

## 2023-09-03 LAB — URINALYSIS, ROUTINE W REFLEX MICROSCOPIC
Bacteria, UA: NONE SEEN
Bilirubin Urine: NEGATIVE
Glucose, UA: NEGATIVE mg/dL
Hgb urine dipstick: NEGATIVE
Ketones, ur: NEGATIVE mg/dL
Leukocytes,Ua: NEGATIVE
Nitrite: POSITIVE — AB
Specific Gravity, Urine: 1.023 (ref 1.005–1.030)
pH: 5.5 (ref 5.0–8.0)

## 2023-09-03 LAB — COMPREHENSIVE METABOLIC PANEL
ALT: 25 U/L (ref 0–44)
AST: 36 U/L (ref 15–41)
Albumin: 4.3 g/dL (ref 3.5–5.0)
Alkaline Phosphatase: 41 U/L (ref 38–126)
Anion gap: 7 (ref 5–15)
BUN: 11 mg/dL (ref 8–23)
CO2: 30 mmol/L (ref 22–32)
Calcium: 9.6 mg/dL (ref 8.9–10.3)
Chloride: 103 mmol/L (ref 98–111)
Creatinine, Ser: 1.14 mg/dL (ref 0.61–1.24)
GFR, Estimated: >60 mL/min (ref >60)
Glucose, Bld: 68 mg/dL — ABNORMAL LOW (ref 70–99)
Potassium: 4.2 mmol/L (ref 3.5–5.1)
Sodium: 140 mmol/L (ref 135–145)
Total Bilirubin: 0.7 mg/dL (ref 0.0–1.2)
Total Protein: 7.2 g/dL (ref 6.5–8.1)

## 2023-09-03 LAB — CBC WITH DIFFERENTIAL/PLATELET
Abs Immature Granulocytes: 0.01 10*3/uL (ref 0.00–0.07)
Basophils Absolute: 0 10*3/uL (ref 0.0–0.1)
Basophils Relative: 0 %
Eosinophils Absolute: 0 10*3/uL (ref 0.0–0.5)
Eosinophils Relative: 1 %
HCT: 41.2 % (ref 39.0–52.0)
Hemoglobin: 13.5 g/dL (ref 13.0–17.0)
Immature Granulocytes: 0 %
Lymphocytes Relative: 36 %
Lymphs Abs: 1.9 10*3/uL (ref 0.7–4.0)
MCH: 30.8 pg (ref 26.0–34.0)
MCHC: 32.8 g/dL (ref 30.0–36.0)
MCV: 94.1 fL (ref 80.0–100.0)
Monocytes Absolute: 0.3 10*3/uL (ref 0.1–1.0)
Monocytes Relative: 6 %
Neutro Abs: 3 10*3/uL (ref 1.7–7.7)
Neutrophils Relative %: 57 %
Platelets: 116 10*3/uL — ABNORMAL LOW (ref 150–400)
RBC: 4.38 MIL/uL (ref 4.22–5.81)
RDW: 12.8 % (ref 11.5–15.5)
WBC: 5.4 10*3/uL (ref 4.0–10.5)
nRBC: 0 % (ref 0.0–0.2)

## 2023-09-03 LAB — CBG MONITORING, ED
Glucose-Capillary: 44 mg/dL — CL (ref 70–99)
Glucose-Capillary: 89 mg/dL (ref 70–99)

## 2023-09-03 LAB — LIPASE, BLOOD: Lipase: 46 U/L (ref 11–51)

## 2023-09-03 MED ORDER — LACTATED RINGERS IV BOLUS
1000.0000 mL | Freq: Once | INTRAVENOUS | Status: AC
  Administered 2023-09-03: 1000 mL via INTRAVENOUS

## 2023-09-03 MED ORDER — HYDROMORPHONE HCL 1 MG/ML IJ SOLN
1.0000 mg | Freq: Once | INTRAMUSCULAR | Status: AC
  Administered 2023-09-03: 1 mg via INTRAVENOUS
  Filled 2023-09-03: qty 1

## 2023-09-03 MED ORDER — DEXTROSE 50 % IV SOLN
25.0000 mL | Freq: Once | INTRAVENOUS | Status: AC
Start: 2023-09-03 — End: 2023-09-03
  Administered 2023-09-03: 25 mL via INTRAVENOUS
  Filled 2023-09-03: qty 50

## 2023-09-03 MED ORDER — ONDANSETRON HCL 4 MG/2ML IJ SOLN
4.0000 mg | Freq: Once | INTRAMUSCULAR | Status: AC
  Administered 2023-09-03: 4 mg via INTRAVENOUS
  Filled 2023-09-03: qty 2

## 2023-09-03 MED ORDER — IOHEXOL 300 MG/ML  SOLN
100.0000 mL | Freq: Once | INTRAMUSCULAR | Status: AC | PRN
  Administered 2023-09-03: 85 mL via INTRAVENOUS

## 2023-09-03 NOTE — Telephone Encounter (Signed)
Chief Complaint: back pain Symptoms: R-sided thoracic pain that starts at the end of the ribs and wraps around the back Frequency: 3-4 days ago Pertinent Negatives: Patient denies fever Disposition: [x] ED /[] Urgent Care (no appt availability in office) / [] Appointment(In office/virtual)/ []  [facility]/ [] Home Care/ [] Refused Recommended Disposition /[] [facility]/ []  Follow-up with PCP Additional Notes: Pt reports R sided thoracic pain that wraps around the back with sudden onset 3-4 days ago. Endorses some nausea and abdominal pain with a hx of Crohns. States pain is an 8/10 and walking is sometimes  "a struggle." Denies CP or SOB. Denies hematuria but states he does have some burning with urination and is taking Azo and states this is a chronic problem. Per protocol for back pain, pt advised to go to the ED and he was agreeable. RN advised pt he should not drive. Pt states he will take an [place]. RN advised pt to call 911 if he worsens. Pt verbalized understanding.    Copied from CRM [id number]. Topic: Clinical - Red Word Triage >> Sep 03, 2023  9:41 AM [name] wrote: [facility] that prompted transfer to Nurse Triage: Patient has severe pain in his right side and around to his back. Pain started about 3 days ago. Rating 8/10. Patient does not know what caused pain to begin. Reason for Disposition  [1] SEVERE back pain (e.g., excruciating) AND [2] sudden onset AND [3] age > 60 years  Answer Assessment - Initial Assessment Questions 1. ONSET: "When did the pain begin?"      3-4 days ago 2. LOCATION: "Where does it hurt?" (upper, mid or lower back)     R side, end of the ribcage and around to the back  3. SEVERITY: "How bad is the pain?"  (e.g., Scale 1-10; mild, moderate, or severe)   - MILD (1-3): Doesn't interfere with normal activities.    - MODERATE (4-7): Interferes with normal activities or awakens from sleep.    - SEVERE (8-10): Excruciating pain, unable to do any  normal activities.      8/10 - states walking is a struggle 4. PATTERN: "Is the pain constant?" (e.g., yes, no; constant, intermittent)      Comes and goes, "if I lay down and am still it goes away" 5. RADIATION: "Does the pain shoot into your legs or somewhere else?"     Pain is sharp, wraps around the back 6. CAUSE:  "What do you think is causing the back pain?"      Unsure - "it feels like a nerve" 7. BACK OVERUSE:  "Any recent lifting of heavy objects, strenuous work or exercise?"     None 8. MEDICINES: "What have you taken so far for the pain?" (e.g., nothing, acetaminophen, NSAIDS)     On a pain mgmt plan for pelvic floor pain and abdominal surgery pain. Taking Norco, Gabapentin. Not taking Tylenol or any NSAIDS. States medications are helping. 9. NEUROLOGIC SYMPTOMS: "Do you have any weakness, numbness, or problems with bowel/bladder control?"     "A little bit" of numbness in the legs/feet, but states he can feel when he touches his foot. Hx of Crohn's disease. Many abdominal surgeries. No new or worsening trouble with bowel or bladder. Last abdominal surgery a year again. 10. OTHER SYMPTOMS: "Do you have any other symptoms?" (e.g., fever, abdomen pain, burning with urination, blood in urine)       "A little nausea", "I have constant abdominal pain d/t Crohns", no  hematuria, "sometimes" has burning with urination (taking Azo) but states that is not new  Protocols used: Back Pain-A-AH

## 2023-09-03 NOTE — Discharge Instructions (Signed)
Please read and follow all provided instructions.  Your diagnoses today include:  1. Right upper quadrant abdominal pain     Tests performed today include: Blood cell counts and platelets Kidney and liver function tests Pancreas function test (called lipase) Urine test to look for infection CT scan and ultrasound: Shows a normal gallbladder, adhesions were noted, some mild inflammation without definite infection Vital signs. See below for your results today.   Medications prescribed:  None  Take any prescribed medications only as directed.  Home care instructions:  Follow any educational materials contained in this packet.  Follow-up instructions: Please follow-up with your primary care provider in the next 3 days for further evaluation of your symptoms.    Return instructions:  SEEK IMMEDIATE MEDICAL ATTENTION IF: The pain does not go away or becomes severe  A temperature above 101F develops  Repeated vomiting occurs (multiple episodes)  The pain becomes localized to portions of the abdomen. The right side could possibly be appendicitis. In an adult, the left lower portion of the abdomen could be colitis or diverticulitis.  Blood is being passed in stools or vomit (bright red or black tarry stools)  You develop chest pain, difficulty breathing, dizziness or fainting, or become confused, poorly responsive, or inconsolable (young children) If you have any other emergent concerns regarding your health  Additional Information: Abdominal (belly) pain can be caused by many things. Your caregiver performed an examination and possibly ordered blood/urine tests and imaging (CT scan, x-rays, ultrasound). Many cases can be observed and treated at home after initial evaluation in the emergency department. Even though you are being discharged home, abdominal pain can be unpredictable. Therefore, you need a repeated exam if your pain does not resolve, returns, or worsens. Most patients with  abdominal pain don't have to be admitted to the hospital or have surgery, but serious problems like appendicitis and gallbladder attacks can start out as nonspecific pain. Many abdominal conditions cannot be diagnosed in one visit, so follow-up evaluations are very important.  Your vital signs today were: BP (!) 132/98   Pulse 63   Temp (!) 96.7 F (35.9 C) (Axillary)   Resp 16   Ht 5\' 10"  (1.778 m)   Wt 71.2 kg   SpO2 100%   BMI 22.53 kg/m  If your blood pressure (bp) was elevated above 135/85 this visit, please have this repeated by your doctor within one month. --------------

## 2023-09-03 NOTE — ED Provider Notes (Signed)
[place] EMERGENCY DEPARTMENT AT [facility] Provider Note   CSN: [id number] Arrival date & time: 09/03/23  1041     History  Chief Complaint  Patient presents with   Flank Pain    [name] is a 73 y.o. male.  Patient with history of Crohn's disease, multiple bowel resections, on hydrocodone chronically, immunosuppressed on upadacitinib for control of Crohn's --presents to the emergency department for 5-day history of right upper quadrant abdominal pain.  Pain is actually worse with movement.  No change with eating or drinking.  Made slightly better by home hydrocodone.  Stools have been at his baseline without blood.  No dysuria.  Patient has history of chronic abdominal pain however states that current pain is different than what he typically feels.  No fevers.       Home Medications Prior to Admission medications   Medication Sig Start Date End Date Taking? Authorizing Provider  acetaminophen (TYLENOL) 325 MG tablet Take 650 mg by mouth every 6 (six) hours as needed for moderate pain.    [provider]  albuterol (VENTOLIN HFA) 108 (90 Base) MCG/ACT inhaler Inhale 2 puffs into the lungs every 6 (six) hours as needed for wheezing or shortness of breath. 06/22/22   Almon Hercules, MD  AMBULATORY NON FORMULARY MEDICATION Medication Name: nifedipine 0.2%/lidocaine 5% ointment 1:1 mix Apply 3-4 tmes/day into anal canal 01/23/23   Iva Boop, MD  ascorbic acid (VITAMIN C) 250 MG CHEW Chew by mouth.    [provider]  busPIRone (BUSPAR) 10 MG tablet Take 10 mg by mouth every morning.    [provider]  calcium carbonate (TUMS - DOSED IN MG ELEMENTAL CALCIUM) 500 MG chewable tablet Chew 500 mg by mouth 3 (three) times daily as needed for indigestion or heartburn.    [provider]  Cholecalciferol (VITAMIN D) 50 MCG (2000 UT) tablet Take 1 tablet (2,000 Units total) by mouth daily. 01/02/23   Iva Boop, MD  cholestyramine  Lanetta Inch) 4 g packet Take 1 packet (4 g total) by mouth daily with lunch. 08/20/23   Iva Boop, MD  diphenoxylate-atropine (LOMOTIL) 2.5-0.025 MG tablet Take 2 tablets by mouth 4 (four) times daily. 03/08/23   Iva Boop, MD  ferrous sulfate 325 (65 FE) MG tablet Take 1 tablet (325 mg total) by mouth daily with breakfast. 10/10/22   Iva Boop, MD  finasteride (PROSCAR) 5 MG tablet Take 1 tablet (5 mg total) by mouth daily. 08/21/23   [name], MD  gabapentin (NEURONTIN) 100 MG capsule Take 1 capsule (100 mg total) by mouth at bedtime. 08/13/23   [name], MD  HYDROcodone-acetaminophen (NORCO) 5-325 MG tablet Take 1 tablet by mouth every 8 (eight) hours as needed for moderate pain (pain score 4-6). 07/20/23   [name], MD  HYDROcodone-acetaminophen (NORCO) 5-325 MG tablet Take 1 tablet by mouth every 8 (eight) hours as needed for moderate pain (pain score 4-6). 07/20/23   [name], MD  HYDROcodone-acetaminophen (NORCO) 5-325 MG tablet Take 1 tablet by mouth every 8 (eight) hours as needed for moderate pain (pain score 4-6). 08/30/23   Burchette, Elberta Fortis, MD  hyoscyamine (LEVSIN SL) 0.125 MG SL tablet Place 0.125 mg under the tongue every 6 (six) hours as needed for cramping.    [provider]  magnesium oxide (MAG-OX) 400 MG tablet Take 1 tablet (400 mg total) by mouth 2 (two) times daily. 02/18/23  Iva Boop, MD  melatonin 3 MG TABS tablet Take 3 mg by mouth at bedtime.    [provider]  Multiple Vitamin (MULTIVITAMIN WITH MINERALS) TABS tablet Take 1 tablet by mouth daily.    [provider]  ondansetron (ZOFRAN-ODT) 4 MG disintegrating tablet Dissolve 1 tablet (4 mg total) by mouth every 6 (six) hours as needed for nausea or vomiting. 06/22/22   Almon Hercules, MD  pantoprazole (PROTONIX) 40 MG tablet Take 40 mg by mouth daily.    [provider]  phenazopyridine (PYRIDIUM) 200 MG tablet TAKE 1 TABLET(200 MG) BY MOUTH THREE TIMES  DAILY AS NEEDED FOR URINARY PAIN 08/14/23   [name], MD  Potassium Chloride ER 20 MEQ TBCR TAKE 1 TABLET(20 MEQ) BY MOUTH TWICE DAILY 05/31/23   Iva Boop, MD  rifaximin (XIFAXAN) 550 MG TABS tablet Take 1 tablet (550 mg total) by mouth 3 (three) times daily. 06/28/23   Iva Boop, MD  simethicone (MYLICON) 80 MG chewable tablet Chew 80 mg by mouth 4 (four) times daily -  with meals and at bedtime.    [provider]  thiamine (VITAMIN B1) 100 MG tablet Take 1 tablet (100 mg total) by mouth daily. 10/10/22   Iva Boop, MD  traZODone (DESYREL) 100 MG tablet Take 1 tablet (100 mg total) by mouth at bedtime. 08/13/23   [name], MD  Upadacitinib ER (RINVOQ) 15 MG TB24 Take 15 mg by mouth daily.    [provider]  valACYclovir (VALTREX) 1000 MG tablet Take one tablet 3 times a day for 7 days as needed for shingles flares 03/02/23   [name], MD      Allergies    Purinethol [mercaptopurine], Shellfish allergy, Humira [adalimumab], Tape, and Wound dressing adhesive    Review of Systems   Review of Systems  Physical Exam Updated Vital Signs BP (!) 137/95 (BP Location: Right Arm)   Pulse 62   Temp 97.8 F (36.6 C)   Resp 16   Ht 5\' 10"  (1.778 m)   Wt 71.2 kg   SpO2 100%   BMI 22.53 kg/m   Physical Exam Vitals and nursing note reviewed.  Constitutional:      General: He is not in acute distress.    Appearance: He is well-developed.  HENT:     Head: Normocephalic and atraumatic.  Eyes:     General:        Right eye: No discharge.        Left eye: No discharge.     Conjunctiva/sclera: Conjunctivae normal.  Cardiovascular:     Rate and Rhythm: Normal rate and regular rhythm.     Heart sounds: Normal heart sounds.  Pulmonary:     Effort: Pulmonary effort is normal.     Breath sounds: Normal breath sounds.  Abdominal:     Palpations: Abdomen is soft.     Tenderness: There is abdominal tenderness in the right upper quadrant and  epigastric area. There is no guarding or rebound. Negative signs include Murphy's sign, Rovsing's sign and McBurney's sign.     Comments: Multiple surgical scars, well-healed over the abdomen  Musculoskeletal:     Cervical back: Normal range of motion and neck supple.  Skin:    General: Skin is warm and dry.  Neurological:     Mental Status: He is alert.     ED Results / Procedures / Treatments   Labs (all labs ordered are  listed, but only abnormal results are displayed) Labs Reviewed  CBC WITH DIFFERENTIAL/PLATELET - Abnormal; Notable for the following components:      Result Value   Platelets 116 (*)    All other components within normal limits  COMPREHENSIVE METABOLIC PANEL - Abnormal; Notable for the following components:   Glucose, Bld 68 (*)    All other components within normal limits  URINALYSIS, ROUTINE W REFLEX MICROSCOPIC - Abnormal; Notable for the following components:   Protein, ur TRACE (*)    Nitrite POSITIVE (*)    All other components within normal limits  CBG MONITORING, ED - Abnormal; Notable for the following components:   Glucose-Capillary 44 (*)    All other components within normal limits  LIPASE, BLOOD  CBG MONITORING, ED    EKG None  Radiology CT ABDOMEN PELVIS W CONTRAST Result Date: 09/03/2023 CLINICAL DATA:  Abdominal pain. EXAM: CT ABDOMEN AND PELVIS WITH CONTRAST TECHNIQUE: Multidetector CT imaging of the abdomen and pelvis was performed using the standard protocol following bolus administration of intravenous contrast. RADIATION DOSE REDUCTION: This exam was performed according to the departmental dose-optimization program which includes automated exposure control, adjustment of the mA and/or kV according to patient size and/or use of iterative reconstruction technique. CONTRAST:  85mL OMNIPAQUE IOHEXOL 300 MG/ML  SOLN COMPARISON:  CT abdomen pelvis dated 05/31/2023. FINDINGS: Lower chest: The visualized lung bases are clear. Cardiac pacemaker  noted. No intra-abdominal free air or free fluid. Hepatobiliary: The liver is unremarkable. The gallbladder is unremarkable. Mild biliary dilatation. No calcified stone noted in the central CBD. Pancreas: Unremarkable. No pancreatic ductal dilatation or surrounding inflammatory changes. Spleen: Normal in size without focal abnormality. Adrenals/Urinary Tract: The adrenal glands unremarkable. There is no hydronephrosis on either side. There is symmetric enhancement and excretion of contrast by both kidneys. The visualized ureters appear unremarkable. The urinary bladder is collapsed. Stomach/Bowel: There is postsurgical changes of the bowel with anastomotic suture in the pelvis. There is tethering of bowel loops in the right lower quadrant suggestive of adhesions. There is mild stranding of the adjacent fat indicative of an inflammatory process. There is abutment of these loops of bowel to the anterior peritoneal wall in the right lower abdomen consistent with adhesions. No evidence of bowel obstruction. Appendectomy. Vascular/Lymphatic: Mild aortoiliac atherosclerotic disease. The IVC is unremarkable. No portal venous gas. There is no adenopathy. Reproductive: The prostate and seminal vesicles are grossly unremarkable. No pelvic mass. Other: Anterior abdominal wall surgical scarring. Musculoskeletal: No acute or significant osseous findings. IMPRESSION: 1. Postsurgical changes of the bowel. There is tethering of bowel loops in the right lower quadrant suggestive of adhesions. There is mild stranding of the adjacent fat indicative of an inflammatory process. No evidence of bowel obstruction. 2. Mild biliary dilatation. No calcified stone noted in the central CBD. 3.  Aortic Atherosclerosis (ICD10-I70.0). Electronically Signed   By: Elgie Collard M.D.   On: 09/03/2023 18:34   US Abdomen Limited RUQ (LIVER/GB) Result Date: 09/03/2023 CLINICAL DATA:  [id number] Abdominal pain, RUQ 160109 EXAM: ULTRASOUND ABDOMEN  LIMITED RIGHT UPPER QUADRANT COMPARISON:  None Available. FINDINGS: Gallbladder: No gallstones or wall thickening visualized. No sonographic Murphy sign noted by sonographer. Common bile duct: Diameter: Up to 6.5 mm, within normal limits for patient's age. No intrahepatic bile duct dilation. Liver: No focal lesion identified. Within normal limits in parenchymal echogenicity. Portal vein is patent on color Doppler imaging with normal direction of blood flow towards the liver. Other: None. IMPRESSION: *Unremarkable right upper  quadrant ultrasound exam. Electronically Signed   By: Jules Schick M.D.   On: 09/03/2023 15:31    Procedures Procedures    Medications Ordered in ED Medications  HYDROmorphone (DILAUDID) injection 1 mg (1 mg Intravenous Given 09/03/23 1158)  ondansetron (ZOFRAN) injection 4 mg (4 mg Intravenous Given 09/03/23 1157)  lactated ringers bolus 1,000 mL (0 mLs Intravenous Stopped 09/03/23 1500)  HYDROmorphone (DILAUDID) injection 1 mg (1 mg Intravenous Given 09/03/23 1634)  iohexol (OMNIPAQUE) 300 MG/ML solution 100 mL (85 mLs Intravenous Contrast Given 09/03/23 1615)  dextrose 50 % solution 25 mL (25 mLs Intravenous Given 09/03/23 1643)    ED Course/ Medical Decision Making/ A&P    Patient seen and examined. History obtained directly from patient.  Reviewed previous gastroenterology notes.  Labs/EKG: Ordered CBC, CMP, lipase, UA.  Imaging: Ordered right upper quadrant ultrasound.  Medications/Fluids: Ordered: IV Dilaudid, IV Zofran.   Most recent vital signs reviewed and are as follows: BP (!) 137/95 (BP Location: Right Arm)   Pulse 62   Temp 97.8 F (36.6 C)   Resp 16   Ht 5\' 10"  (1.778 m)   Wt 71.2 kg   SpO2 100%   BMI 22.53 kg/m   Initial impression: Right upper quadrant abdominal pain, different than typical chronic abdominal pain.  3:55 PM Reassessment performed. Patient appears comfortable, stable.  Labs personally reviewed and interpreted including: CBC  with normal white blood cell count, platelets low at 116 otherwise unremarkable; CMP glucose 68 otherwise unremarkable with normal liver function testing; lipase normal; UA nitrite positive however likely false positive due to Azo use, otherwise UA not suggestive of UTI.  Imaging personally visualized and interpreted including: Right upper quadrant ultrasound, agree no acute findings.  Reviewed pertinent lab work and imaging with patient at bedside. Questions answered.   Most current vital signs reviewed and are as follows: BP 126/77   Pulse 63   Temp 97.8 F (36.6 C)   Resp 16   Ht 5\' 10"  (1.778 m)   Wt 71.2 kg   SpO2 95%   BMI 22.53 kg/m   Plan: Shared decision making discussion had with patient at bedside.  We discussed his reassuring lab workup and negative right upper quadrant ultrasound.  We discussed monitoring of symptoms versus additional evaluation with CT imaging of the abdomen pelvis.  We did discuss that he is technically immunosuppressed due to medication for control of Crohn's disease.  After discussion, patient voiced concern about pain not improving at all over the past 5 days.  He would prefer to have additional testing done here.  CT ordered.  4:47 PM patient returned from CT.  Blood sugar was rechecked, found to be 44.  Patient asymptomatic will be given half an amp of D50.  Anticipate he will be able to eat after CT results.  Blood sugar recovered well.  6:53 PM Reassessment performed. Patient appears comfortable, up in hallway.  Reviewed CT report with Dr. Anitra Lauth.  Reviewed pertinent lab work and imaging with patient at bedside. Questions answered.   Most current vital signs reviewed and are as follows: BP (!) 132/98   Pulse 63   Temp (!) 96.7 F (35.9 C) (Axillary)   Resp 16   Ht 5\' 10"  (1.778 m)   Wt 71.2 kg   SpO2 100%   BMI 22.53 kg/m   Plan: Discharge to home.   Prescriptions written for: None  Other home care instructions discussed: Continue  use of home medications  ED return instructions  discussed: The patient was urged to return to the Emergency Department immediately with worsening of current symptoms, worsening abdominal pain, persistent vomiting, blood noted in stools, fever, or any other concerns. The patient verbalized understanding.   Follow-up instructions discussed: Patient encouraged to follow-up with their PCP/GI in 3 days.                                  Medical Decision Making Amount and/or Complexity of Data Reviewed Labs: ordered. Radiology: ordered.  Risk Prescription drug management.   For this patient's complaint of abdominal pain, the following conditions were considered on the differential diagnosis: gastritis/PUD, enteritis/duodenitis, appendicitis, cholelithiasis/cholecystitis, cholangitis, pancreatitis, ruptured viscus, colitis, diverticulitis, small/large bowel obstruction, proctitis, cystitis, pyelonephritis, ureteral colic, aortic dissection, aortic aneurysm. Atypical chest etiologies were also considered including ACS, PE, and pneumonia.  Workup today was overall reassuring.  Ultrasound was negative for gallbladder pathology.  CT demonstrates nonspecific inflammation, thought low likelihood to be infectious per patient's labs and exam.  Adhesions are also a possibility.  However with overall reassuring workup, no indications for admission today.  Patient is well-established with outpatient GI and PCP.  The patient's vital signs, pertinent lab work and imaging were reviewed and interpreted as discussed in the ED course. Hospitalization was considered for further testing, treatments, or serial exams/observation. However as patient is well-appearing, has a stable exam, and reassuring studies today, I do not feel that they warrant admission at this time. This plan was discussed with the patient who verbalizes agreement and comfort with this plan and seems reliable and able to return to the Emergency  Department with worsening or changing symptoms.           Final Clinical Impression(s) / ED Diagnoses Final diagnoses:  Right upper quadrant abdominal pain    Rx / DC Orders ED Discharge Orders     None         Renne Crigler, PA-C 09/03/23 Rosana Fret, MD 09/05/23 [phone]

## 2023-09-03 NOTE — ED Triage Notes (Signed)
Pt c/o RT flank pain radiating to RT lower back x 5 days. Denies dysuria or fever

## 2023-09-04 ENCOUNTER — Other Ambulatory Visit: Payer: Self-pay | Admitting: Internal Medicine

## 2023-09-04 ENCOUNTER — Telehealth: Payer: Self-pay | Admitting: Pediatrics

## 2023-09-04 MED ORDER — PANTOPRAZOLE SODIUM 40 MG PO TBEC: 40.0000 mg | DELAYED_RELEASE_TABLET | Freq: Two times a day (BID) | ORAL | 1 refills | Status: DC

## 2023-09-04 NOTE — Telephone Encounter (Signed)
Inbound call from patient stating that he was seen at the ER for right abdominal pain and they could not find the reasoning for the pain. Patient states he is unable to walk due to the pain and is needing to be seen before his scheduled appointment with  Dr. Leone Payor on 3/6 at 3:30. Requesting a call to discuss. Please advise.

## 2023-09-04 NOTE — Telephone Encounter (Signed)
Chart reviewed - spoke to him  Pain RUQ > epigastrium - constant and like a pressure that becomes shaprp w/ movement and radiates into back  Labs and imaging do not reveal a cause  Had PT 2/10 (sxs x 5 days)  Heating pad helps a little   Ran out of pantoprazole 1 month ago  Stools 3-4/day  Could not tolerate cholestyramine that we tried recently but BM's better  Restart pantoprazole - do bid  Meds ordered this encounter  Medications   pantoprazole (PROTONIX) 40 MG tablet    Sig: Take 1 tablet (40 mg total) by mouth 2 (two) times daily before a meal. Before breakfast and supper    Dispense:  60 tablet    Refill:  1    I told him we will try to see him Friday - let's tentatively plan for 1130 - I explained that with weather this could be disrupted - I did not tell him when so please contact him re: time of appt

## 2023-09-04 NOTE — Telephone Encounter (Signed)
Pt stated that starting 5 days ago he starting having right abdominal pain which has increased. Pt  went to the ED yesterday, [place] and CT completed and discharged to follow up with GI. Pt stated that his BM's are good, some nausea, no emesis, No temp, last BM today. Pt stated that he feels  continuous pressure on the right abdomen/flank area, and he feels a very sharp pain when moving such as sitting up or walking. Pt stated that he is very uncomfortable and states that his pain is an 8/10. Pt requesting sooner office visit than one that was previous schedule on 09/20/2023. Please review and advise. Please advise if its an  option to see pt tomorrow at 8:15 or 11:30  or if pt should proceed to the ED at [facility]  to be seen by GI there or if any other recommendations.

## 2023-09-04 NOTE — Telephone Encounter (Signed)
Pt was scheduled to see Dr. Leone Payor on 09/07/2023 at 11:30 AM. Pt made aware. Pt verbalized understanding with all questions answered.

## 2023-09-07 ENCOUNTER — Other Ambulatory Visit (INDEPENDENT_AMBULATORY_CARE_PROVIDER_SITE_OTHER): Payer: Medicare Other

## 2023-09-07 ENCOUNTER — Encounter: Payer: Self-pay | Admitting: Internal Medicine

## 2023-09-07 ENCOUNTER — Ambulatory Visit (INDEPENDENT_AMBULATORY_CARE_PROVIDER_SITE_OTHER): Payer: Medicare Other | Admitting: Internal Medicine

## 2023-09-07 ENCOUNTER — Telehealth: Payer: Self-pay

## 2023-09-07 VITALS — BP 128/70 | HR 91 | Ht 70.0 in | Wt 157.4 lb

## 2023-09-07 DIAGNOSIS — Z7960 Long term (current) use of unspecified immunomodulators and immunosuppressants: Secondary | ICD-10-CM

## 2023-09-07 DIAGNOSIS — D696 Thrombocytopenia, unspecified: Secondary | ICD-10-CM | POA: Diagnosis not present

## 2023-09-07 DIAGNOSIS — K50018 Crohn's disease of small intestine with other complication: Secondary | ICD-10-CM

## 2023-09-07 DIAGNOSIS — R1031 Right lower quadrant pain: Secondary | ICD-10-CM

## 2023-09-07 LAB — CBC WITH DIFFERENTIAL/PLATELET
Basophils Absolute: 0 10*3/uL (ref 0.0–0.1)
Basophils Relative: 0.2 % (ref 0.0–3.0)
Eosinophils Absolute: 0 10*3/uL (ref 0.0–0.7)
Eosinophils Relative: 0.8 % (ref 0.0–5.0)
HCT: 41.2 % (ref 39.0–52.0)
Hemoglobin: 13.5 g/dL (ref 13.0–17.0)
Lymphocytes Relative: 34 % (ref 12.0–46.0)
Lymphs Abs: 1.6 10*3/uL (ref 0.7–4.0)
MCHC: 32.8 g/dL (ref 30.0–36.0)
MCV: 94.3 fL (ref 78.0–100.0)
Monocytes Absolute: 0.3 10*3/uL (ref 0.1–1.0)
Monocytes Relative: 6.5 % (ref 3.0–12.0)
Neutro Abs: 2.7 10*3/uL (ref 1.4–7.7)
Neutrophils Relative %: 58.5 % (ref 43.0–77.0)
Platelets: 132 10*3/uL — ABNORMAL LOW (ref 150.0–400.0)
RBC: 4.37 Mil/uL (ref 4.22–5.81)
RDW: 13.7 % (ref 11.5–15.5)
WBC: 4.6 10*3/uL (ref 4.0–10.5)

## 2023-09-07 LAB — C-REACTIVE PROTEIN: CRP: <1 mg/dL (ref 0.5–20.0)

## 2023-09-07 LAB — SEDIMENTATION RATE: Sed Rate: 6 mm/h (ref 0–20)

## 2023-09-07 MED ORDER — PREDNISONE 10 MG PO TABS: ORAL_TABLET | ORAL | 0 refills | Status: AC

## 2023-09-07 MED ORDER — RINVOQ 15 MG PO TB24
30.0000 mg | ORAL_TABLET | Freq: Every day | ORAL | 2 refills | Status: DC
Start: 2023-09-07 — End: 2023-09-07

## 2023-09-07 MED ORDER — RINVOQ 30 MG PO TB24: 30.0000 mg | ORAL_TABLET | Freq: Every day | ORAL | 2 refills | Status: DC

## 2023-09-07 NOTE — Telephone Encounter (Signed)
Patient was seen in clinic today by Dr Leone Payor. Dose change for Rinvoq was increased. Patient needs Rinvoq 30 mg once daily. Insurance is requiring PA for dose increase.

## 2023-09-07 NOTE — Telephone Encounter (Signed)
Spoke with pt state that he went to the ED on  09/03/23 for this.

## 2023-09-07 NOTE — Progress Notes (Signed)
[name] 73 y.o. 26-Nov-1950 [id number]  Assessment & Plan:   Encounter Diagnoses  Name Primary?   Crohn's disease of small intestine with other complication (HCC) Yes   RLQ abdominal pain    Thrombocytopenia (HCC)    Long-term use of immunosuppressant medication     I think based upon all of the available information and clinical scenario he has active Crohn's in his right lower quadrant area, likely in the ileum.  Plan to treat with increased dose of Rinvoq at 30 mg daily and a prednisone taper   Thrombocytopenia has developed recently there is no signs of portal hypertension.  Will ask for a peripheral smear review question platelet clumping if persistent issue may need hematology referral.  He has hypertriglyceridemia on the Rinvoq and we will need to reassess that at some point.  He is not fasting today so I did not pursue it. Meds ordered this encounter  Medications                  predniSONE (DELTASONE) 10 MG tablet    Sig: Take 4 tablets (40 mg total) by mouth daily for 10 days, THEN 3 tablets (30 mg total) daily for 10 days, THEN 2 tablets (20 mg total) daily for 10 days, THEN 1 tablet (10 mg total) daily for 10 days.    Dispense:  100 tablet    Refill:  0   Upadacitinib ER (RINVOQ) 30 MG TB24    Sig: Take 1 tablet (30 mg total) by mouth daily.    Dispense:  90 tablet    Refill:  2   Orders Placed This Encounter  Procedures   Smear Review per Protocol   CALPROTECTIN   CBC w/Diff   Sedimentation rate   C-reactive protein     Subjective:  Gastroenterology problem summary   Crohn's disease of the small intestine-ileitis in a patient status post small bowel resection as below: June 2013 47 cm small bowel resection incarcerated ventral hernia Small intestine, resection - SMALL BOWEL WITH EXTENSIVE ACUTE SEROSITIS AND ASSOCIATED FAT NECROSIS AND ADHESIONS. - NO ATYPIA OR MALIGNANCY. - RESECTION MARGINS VIABLE.   Original Crohn's diagnosis October  2018 colonoscopy, budesonide therapy then 6-MP plus Humira early 2019, developed pancreatitis from 6-MP.  Additional budesonide and then weekly Humira but antibodies very high 04/05/2018 and loss of efficacy. Prednisone used as budesonide was not helpful, Stelara tried early 2020 and then moved to [facility] fall 2020   2022 having problems with perineal and then abdominal pain and diarrhea progressed into 2023 and in March of that year CT showing enteritis/ileitis referred to Dr. Drue Dun and had surgery       November 11, 2021 small bowel resection, two 9 cm long sections A. SMALL BOWEL ANASTOMOSIS, RESECTION:             - Anastomotic site, present.              - Small bowel mucosa with no significant abnormalities.              - Serosa with fibrous adhesions and histiocytic giant cell reaction to mesh material.    B. MESH, REMOVAL:             - Mesh with attached adipose and membranous tissue, gross examination only.    C. SMALL BOWEL ANASTOMOSIS #2, RESECTION:             - Chronic active ileitis with pseudopolyp formation and mucosa ulceration.  -  Anastomotic site identified.  - See comment.    D. MESH #2 , REMOVAL:             - Mesh with attached adipose tissue, gross examination only.    Several admissions due to high output ileostomy and need for hydration and TPN rehab stays, also complicated by abdominal wall abscess requiring drainage.  Was on home TPN.  Dr. Drue Dun did not want to takedown ileostomy. Was staying with his sister in [place] (she could help with TPN) and saw Dr. Peri JeffersonChancy Hurter at [facility]   08/29/2022 preoperative colonoscopy mild diversion colitis noted throughout the colon exam to long Hartmann stump and   08/30/2022 ileostomy reversal at [facility]      Small intestine (ileostomy trim): -Benign skin small intestinal tissue consistent with ostomy site.   This electronic signature is attestation that the pathologist personally reviewed the submitted material(s)  and the final diagnosis reflects that evaluation.  Electronically signed by Rogelia Mire, MD on 08/31/2022 at 10:24 AM  Clinical History    Pre-op diagnosis: CROHN'S DISEASE  Gross Description    A.  Label: Small intestine (ileostomy trim)-permanent Stoma size: 2.2 x 2.1 cm Surrounding skin: 0.5 cm wide rim of lightly pigmented skin Attached bowel: 1.5 cm in length by 1.7 cm in diameter; unremarkable Other findings: An additional 4.5 x 2.2 x 1.5 cm mucosal fragment is present; unremarkable    Postop.  Outpatient, he struggled with hydration some Lomotil helping, some question of short-bowel syndrome consideration for TPN but not used     CT-E 11/08/22 IMPRESSION: 1. Interval ileostomy reversal and ileocolic anastomosis. 2. Multiple distal small bowel and colon resections with the remaining portions of the colon situated in the right hemiabdomen. 3. Mild fat stranding about the ileocolic anastomosis and adjacent descending portion of the colon in the right lower quadrant, similar to prior examination. This may be postoperative or reflect ongoing inflammation. 4. No other inflammatory findings of the bowel. No evidence of stricture, obstruction, fistula, or abscess at this time.   CT abdomen pelvis without contrast (poor IV access) 05/31/2023   IMPRESSION: 1. Postoperative changes from multiple small bowel and colonic resections with right lower quadrant ileocolonic anastomosis. 2. Equivocal wall thickening involving the neo-terminal ileum. There is also mild wall thickening involving the partially decompressed distal colon. Findings are nonspecific but may reflect mild active inflammatory bowel disease. 3. No signs of active penetrating disease, bowel obstruction or abscess. 4.  Aortic Atherosclerosis (ICD10-I70.0).   Multiple deficiencies-thiamine, potassium iron, vitamin D detected 10/04/2022 follow-up   April and May 2024 visits-Entyvio antibody testing negative,  decided to treat with Rinvoq.  He is awaiting that.  Calprotectin 275 in April.  C-reactive protein normal.  Thiamine was low and responded to therapy.  Ferritin 22 B12 and folate normal.  On iron.   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 7 days; 6-MP discontinued 2/2 pancreatitis 2020 - Trial of Stelara (unresponsive) and subsequently Entyvio with improvement 2022 - Perineal/abdominal pain with active inflammation on CT 10/2021 [facility]) - Ex lap, LOA, mesh removal, ileocolic resection, end ileostomy -managed on Entyvio postop 02/2022 [facility]) - EC fistula takedown c/b high output ileostomy and FTT requiring TPN 08/2022 ([facility]) -Ileostomy takedown 12/2022 - Induction Rinvoq 15 mg po daily   _______________________________________________-  Chief Complaint: Abdominal pain, Crohn's disease  HPI 73 year old white man with a complicated history of Crohn's disease and adhesive disease related to mesh hernia repair who is here for follow-up because of right sided abdominal pain in the lower quadrant.   Things intensified and he went to the emergency department recently.  He had been having pain and I had evaluated with an unenhanced CT scan (difficult IV access) and he was somewhat improved.  He has been doing PT because he has had what we thought was some abdominal wall pain and that is helping his flank pain.  However as stated in the last week or so things intensified and he was pretty miserable so he went to the ED.  Findings as follows:  CT abd/pelvis w/ IV contrast 09/03/23 There is postsurgical changes of the bowel with anastomotic suture in the pelvis. There is tethering of bowel loops in the right lower quadrant suggestive of adhesions. There is mild stranding of the adjacent fat indicative of an inflammatory process. There is abutment of these loops of bowel to the anterior peritoneal wall in the right lower abdomen consistent with adhesions. No evidence of bowel obstruction. Appendectomy.  Right upper quadrant ultrasound was negative   Hemoglobin 13.5 white count 5.4 platelets 116 (not a new finding) CMP was normal except for glucose 68 Lipase was normal  We spoke on the phone and he had run out of his pantoprazole so I restarted that.  He did not seem to have signs of GI bleeding though stools might have been a little dark.  He says he feels a little bit better with that.  However he is still having the right lower quadrant pain.  Stools are more formed overall.  Recent second opinion visit with Dr. Doy Hutching. Allergies  Allergen Reactions   Purinethol [Mercaptopurine] Other (See Comments)    Pancreatitis    Shellfish Allergy Anaphylaxis and Swelling    Can use standard SMOF lipid formulation for TPN without any issue.    Humira [Adalimumab] Other (See Comments)    Developed antibodies   Tape Rash   Wound Dressing Adhesive Rash   Current Meds  Medication Sig   acetaminophen (TYLENOL) 325 MG tablet Take 650 mg by mouth every 6 (six) hours as needed for moderate pain.    albuterol (VENTOLIN HFA) 108 (90 Base) MCG/ACT inhaler Inhale 2 puffs into the lungs every 6 (six) hours as needed for wheezing or shortness of breath.   AMBULATORY NON FORMULARY MEDICATION Medication Name: nifedipine 0.2%/lidocaine 5% ointment 1:1 mix Apply 3-4 tmes/day into anal canal   ascorbic acid (VITAMIN C) 250 MG CHEW Chew by mouth.   busPIRone (BUSPAR) 10 MG tablet Take 10 mg by mouth every morning.   calcium carbonate (TUMS - DOSED IN MG ELEMENTAL CALCIUM) 500 MG chewable tablet  Chew 500 mg by mouth 3 (three) times daily as needed for indigestion or heartburn.   Cholecalciferol (VITAMIN D) 50 MCG (2000 UT) tablet Take 1 tablet (2,000 Units total) by mouth daily.   diphenoxylate-atropine (LOMOTIL) 2.5-0.025 MG tablet Take 2 tablets by mouth 4 (four) times daily.   ferrous sulfate 325 (65 FE) MG tablet Take 1 tablet (325 mg total) by mouth daily with breakfast.   finasteride (PROSCAR) 5 MG tablet Take 1 tablet (5 mg total) by mouth daily.   gabapentin (NEURONTIN) 100 MG capsule Take 1 capsule (100 mg total) by mouth at bedtime.   HYDROcodone-acetaminophen (NORCO) 5-325 MG tablet Take 1 tablet by mouth every 8 (eight) hours as needed for moderate pain (pain score 4-6).   HYDROcodone-acetaminophen (NORCO) 5-325 MG tablet Take 1 tablet by mouth every 8 (eight) hours as needed for moderate pain (pain score 4-6).   HYDROcodone-acetaminophen (NORCO) 5-325 MG tablet Take 1 tablet by mouth every 8 (eight) hours as needed for moderate pain (pain score 4-6).   hyoscyamine (LEVSIN SL) 0.125 MG SL tablet Place 0.125 mg under the tongue every 6 (six) hours as needed for cramping.   magnesium oxide (MAG-OX) 400 MG tablet Take 1 tablet (400 mg total) by mouth 2 (two) times daily.   melatonin 3 MG TABS tablet Take 3 mg by mouth at bedtime.   Multiple Vitamin (MULTIVITAMIN WITH MINERALS) TABS tablet Take 1 tablet by mouth daily.   ondansetron (ZOFRAN-ODT) 4 MG disintegrating tablet Dissolve 1 tablet (4 mg  total) by mouth every 6 (six) hours as needed for nausea or vomiting.   pantoprazole (PROTONIX) 40 MG tablet TAKE 1 TABLET(40 MG) BY MOUTH TWICE DAILY BEFORE BREAKFAST AND SUPPER   phenazopyridine (PYRIDIUM) 200 MG tablet TAKE 1 TABLET(200 MG) BY MOUTH THREE TIMES DAILY AS NEEDED FOR URINARY PAIN   Potassium Chloride ER 20 MEQ TBCR TAKE 1 TABLET(20 MEQ) BY MOUTH TWICE DAILY   predniSONE (DELTASONE) 10 MG tablet Take 4 tablets (40 mg total) by mouth daily for 10 days, THEN 3 tablets (30 mg total) daily for 10 days, THEN 2 tablets (20 mg total) daily for 10 days, THEN 1 tablet (10 mg total) daily for 10 days.   rifaximin (XIFAXAN) 550 MG TABS tablet Take 1 tablet (550 mg total) by mouth 3 (three) times daily.   simethicone (MYLICON) 80 MG chewable tablet Chew 80 mg by mouth 4 (four) times daily -  with meals and at bedtime.   thiamine (VITAMIN B1) 100 MG tablet Take 1 tablet (100 mg total) by mouth daily.   traZODone (DESYREL) 100 MG tablet Take 1 tablet (100 mg total) by mouth at bedtime.   Upadacitinib ER (RINVOQ) 30 MG TB24 Take 1 tablet (30 mg total) by mouth daily.   valACYclovir (VALTREX) 1000 MG tablet Take one tablet 3 times a day for 7 days as needed for shingles flares   [DISCONTINUED] cholestyramine (QUESTRAN) 4 g packet Take 1 packet (4 g total) by mouth daily with lunch.   [DISCONTINUED] Upadacitinib ER (RINVOQ) 15 MG TB24 Take 15 mg by mouth daily.   Past Medical History:  Diagnosis Date   Acute prostatitis 07/24/2007   Qualifier: Diagnosis of  By: Clent Ridges MD, Jeannett Senior A    Acute respiratory failure with hypoxia [facility])    Allergy    mild   Arthritis    osteoarthritis   Aspiration pneumonia (HCC) 05/24/2022   Asthma    Blood transfusion without reported diagnosis    BPH (benign  prostatic hypertrophy) with urinary obstruction    Crohn's ileitis (HCC) suspected 05/03/2017   Dilated aortic root (HCC)    noted on echo 08/2012   Diverticulitis of colon    Drug-induced acute pancreatitis  without infection or necrosis - from 6 MP 09/24/2017   EPIDIDYMITIS 02/15/2010   Qualifier: Diagnosis of  By: Clent Ridges MD, Tera Mater    GERD (gastroesophageal reflux disease)    H/O: GI bleed    Hemorrhoids    Hepatitis 1975   unknown type    HERPES SIMPLEX INFECTION 10/14/2007   Qualifier: Diagnosis of  By: Clent Ridges MD, Jeannett Senior A    Hiatal hernia    Ileus following gastrointestinal surgery (HCC) 12/26/2011   Long term (current) use of systemic steroids 06/18/2018   Psoriasis    sees Dr. Karene Fry at [facility].   Rectal fissure    Recurrent ventral incisional hernia 05/10/2012   Shingles    Short bowel syndrome    SVT (supraventricular tachycardia) (HCC)    Ulcer 08/21/2013   ileal   Past Surgical History:  Procedure Laterality Date   BOWEL RESECTION  12/19/2011   Procedure: SMALL BOWEL RESECTION;  Surgeon: Mariella Saa, MD;  Location: [facility];  Service: General;  Laterality: N/A;  with anastamosis and insertion mesh   BRONCHOSCOPY     COLON SURGERY  01/2004   x 2   COLONOSCOPY W/ BIOPSIES  04/26/2017   per Dr. Leone Payor, no polyps, benign inflammation, repeat in 5 yrs    CYSTOSCOPY     ESOPHAGOGASTRODUODENOSCOPY     HEMICOLECTOMY     left side, at [facility], diverticulitis   HEMORRHOID BANDING     HERNIA REPAIR     [phone] incisional hernia   ILEOSTOMY     ILEOSTOMY CLOSURE     INSERTION OF MESH  07/31/2012   Procedure: INSERTION OF MESH;  Surgeon: Mariella Saa, MD;  Location: [facility];  Service: General;;   LAPAROTOMY  12/19/2011   Procedure: EXPLORATORY LAPAROTOMY;  Surgeon: Mariella Saa, MD;  Location: [facility];  Service: General;  Laterality: N/A;   PACEMAKER IMPLANT N/A 12/01/2020   Procedure: PACEMAKER IMPLANT;  Surgeon: Regan Lemming, MD;  Location: [facility]VASIVE [facility];  Service: Cardiovascular;  Laterality: N/A;   PACEMAKER INSERTION Left    TONSILLECTOMY     UPPER GASTROINTESTINAL ENDOSCOPY     VASECTOMY     VENTRAL HERNIA REPAIR  07/31/2012    Procedure: HERNIA REPAIR VENTRAL ADULT;  Surgeon: Mariella Saa, MD;  Location: [facility];  Service: General;  Laterality: N/A;   Social History   Social History Narrative   He is married with 2 sons 1 son is an Acupuncturist and 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   Review of Systems As above  Objective:   Physical Exam BP 128/70   Pulse 91   Ht 5\' 10"  (1.778 m)   Wt 157 lb 6.4 oz (71.4 kg)   SpO2 99%   BMI 22.58 kg/m  Abd thin, soft  BS +, tender in RLQ w/o guarding or mass effect  46 minutes of time spent before during and after patient interaction for this visit.

## 2023-09-07 NOTE — Patient Instructions (Addendum)
Increase Rinvoq to 30 mg once daily. ( This may require prior authorization from your insurance company)   Follow Prednisone taper as directed. ( Instructions on prescription)   Your provider has requested that you go to the basement level for lab work before leaving today. Press "B" on the elevator. The lab is located at the first door on the left as you exit the elevator.  We have sent the following medications to your pharmacy for you to pick up at your convenience: Rinvoq, Prednisone   Due to recent changes in healthcare laws, you may see the results of your imaging and laboratory studies on MyChart before your provider has had a chance to review them.  We understand that in some cases there may be results that are confusing or concerning to you. Not all laboratory results come back in the same time frame and the provider may be waiting for multiple results in order to interpret others.  Please give [place] 48 hours in order for your provider to thoroughly review all the results before contacting the office for clarification of your results.   _______________________________________________________  If your blood pressure at your visit was 140/90 or greater, please contact your primary care physician to follow up on this.  _______________________________________________________  If you are age 71 or older, your body mass index should be between 23-30. Your Body mass index is 22.58 kg/m. If this is out of the aforementioned range listed, please consider follow up with your Primary Care Provider.  If you are age 71 or younger, your body mass index should be between 19-25. Your Body mass index is 22.58 kg/m. If this is out of the aformentioned range listed, please consider follow up with your Primary Care Provider.   ________________________________________________________  The [facility] providers would like to encourage you to use [facility] to communicate with providers for non-urgent requests  or questions.  Due to long hold times on the telephone, sending your provider a message by [facility] may be a faster and more efficient way to get a response.  Please allow 48 business hours for a response.  Please remember that this is for non-urgent requests.  _______________________________________________________

## 2023-09-10 ENCOUNTER — Encounter: Payer: Medicare Other | Admitting: Physical Therapy

## 2023-09-11 ENCOUNTER — Telehealth: Payer: Self-pay | Admitting: Internal Medicine

## 2023-09-11 LAB — SPECIMEN STATUS

## 2023-09-11 LAB — SMEAR REVIEW PER PROTOCOL

## 2023-09-11 NOTE — Telephone Encounter (Signed)
 [name] with [employer] [employer] called about the test that was ordered. She wanted to advise the order isn't "orderable". Please advise.

## 2023-09-11 NOTE — Telephone Encounter (Signed)
 FYI: [facility] called back and was transferred to [facility].

## 2023-09-11 NOTE — Telephone Encounter (Signed)
 Viviann Spare is calling [facility] to see what they need.

## 2023-09-12 ENCOUNTER — Telehealth: Payer: Self-pay

## 2023-09-12 NOTE — Telephone Encounter (Signed)
 Received a call from [facility] yesterday stating that they need an order for a CBC  to proceed with the order for the smear review that was requested. Darreld Mclean Poteat from [employer] [employer]  stated yesterday that if the CBC is normal they will not proceed with the smear review although if the CBC is abnormal they will proceed with the smear review. Verbal order was provided.  After further evaluation of the chart it was noted that a CBC had previous been ordered.  [facility] was contact today  and spoke with Jodie.  After a lengthy discussion and trying to figure out why they had requested a verbal for a CBC to be requested the conclusion was that the CBC was run at [facility] and not [facility].

## 2023-09-13 LAB — CBC WITH DIFFERENTIAL/PLATELET
Basophils Absolute: 0 10*3/uL (ref 0.0–0.2)
Basos: 0 %
EOS (ABSOLUTE): 0 10*3/uL (ref 0.0–0.4)
Eos: 1 %
Hematocrit: 41.3 % (ref 37.5–51.0)
Hemoglobin: 13.1 g/dL (ref 13.0–17.7)
Immature Grans (Abs): 0 10*3/uL (ref 0.0–0.1)
Immature Granulocytes: 0 %
Lymphocytes Absolute: 1.7 10*3/uL (ref 0.7–3.1)
Lymphs: 35 %
MCH: 30.6 pg (ref 26.6–33.0)
MCHC: 31.7 g/dL (ref 31.5–35.7)
MCV: 97 fL (ref 79–97)
Monocytes Absolute: 0.3 10*3/uL (ref 0.1–0.9)
Monocytes: 6 %
Neutrophils Absolute: 2.8 10*3/uL (ref 1.4–7.0)
Neutrophils: 58 %
Platelets: 130 10*3/uL — ABNORMAL LOW (ref 150–450)
RBC: 4.28 x10E6/uL (ref 4.14–5.80)
RDW: 12.6 % (ref 11.6–15.4)
WBC: 4.8 10*3/uL (ref 3.4–10.8)

## 2023-09-13 LAB — SPECIMEN STATUS REPORT

## 2023-09-13 NOTE — Telephone Encounter (Signed)
 Inbound call from patient, stating pharmacy has not received the price increase, would like to speak to a nurse in regards.

## 2023-09-13 NOTE — Telephone Encounter (Signed)
 Viviann Spare prescription for Rinvoq was increased per Dr Leone Payor. Encounter was sent to PA team letting them know that PA would be needed. I think you guys have a protocol for this. I just sent the prescription as instructed by Dr.Gessner.

## 2023-09-14 NOTE — Telephone Encounter (Signed)
 Pt made aware that the prescription has been sent in and our prior authorization team is working on it. Pt verbalized understanding with all questions answered.

## 2023-09-17 ENCOUNTER — Ambulatory Visit: Payer: Medicare Other | Attending: Internal Medicine | Admitting: Physical Therapy

## 2023-09-17 ENCOUNTER — Encounter: Payer: Self-pay | Admitting: Physical Therapy

## 2023-09-17 DIAGNOSIS — R102 Pelvic and perineal pain: Secondary | ICD-10-CM | POA: Insufficient documentation

## 2023-09-17 DIAGNOSIS — M6281 Muscle weakness (generalized): Secondary | ICD-10-CM | POA: Diagnosis present

## 2023-09-17 DIAGNOSIS — R262 Difficulty in walking, not elsewhere classified: Secondary | ICD-10-CM | POA: Diagnosis present

## 2023-09-17 DIAGNOSIS — L905 Scar conditions and fibrosis of skin: Secondary | ICD-10-CM | POA: Diagnosis present

## 2023-09-17 DIAGNOSIS — R1084 Generalized abdominal pain: Secondary | ICD-10-CM | POA: Insufficient documentation

## 2023-09-17 DIAGNOSIS — R252 Cramp and spasm: Secondary | ICD-10-CM | POA: Insufficient documentation

## 2023-09-17 DIAGNOSIS — R293 Abnormal posture: Secondary | ICD-10-CM | POA: Diagnosis present

## 2023-09-17 NOTE — Therapy (Signed)
 OUTPATIENT PHYSICAL THERAPY MALE PELVIC TREATMENT   Patient Name: [name] MRN: [id number] DOB:[birth year], 73 y.o., male Today's Date: 09/17/2023    END OF SESSION:  PT End of Session - 09/17/23 1057     Visit Number 11    Date for PT Re-Evaluation 11/30/23    Authorization - Visit Number 11    Authorization - Number of Visits 20    PT Start Time 1100    PT Stop Time 1140    PT Time Calculation (min) 40 min    Activity Tolerance Patient tolerated treatment well    Behavior During Therapy [facility] for tasks assessed/performed              Past Medical History:  Diagnosis Date   Acute prostatitis 07/24/2007   Qualifier: Diagnosis of  By: Clent Ridges MD, Jeannett Senior A    Acute respiratory failure with hypoxia (HCC)    Allergy    mild   Arthritis    osteoarthritis   Aspiration pneumonia (HCC) 05/24/2022   Asthma    Blood transfusion without reported diagnosis    BPH (benign prostatic hypertrophy) with urinary obstruction    Crohn's ileitis (HCC) suspected 05/03/2017   Dilated aortic root (HCC)    noted on echo 08/2012   Diverticulitis of colon    Drug-induced acute pancreatitis without infection or necrosis - from 6 MP 09/24/2017   EPIDIDYMITIS 02/15/2010   Qualifier: Diagnosis of  By: Clent Ridges MD, Tera Mater    GERD (gastroesophageal reflux disease)    H/O: GI bleed    Hemorrhoids    Hepatitis 1975   unknown type    HERPES SIMPLEX INFECTION 10/14/2007   Qualifier: Diagnosis of  By: Clent Ridges MD, Jeannett Senior A    Hiatal hernia    Ileus following gastrointestinal surgery (HCC) 12/26/2011   Long term (current) use of systemic steroids 06/18/2018   Psoriasis    sees Dr. Karene Fry at [facility].   Rectal fissure    Recurrent ventral incisional hernia 05/10/2012   Shingles    Short bowel syndrome    SVT (supraventricular tachycardia) (HCC)    Ulcer 08/21/2013   ileal   Past Surgical History:  Procedure Laterality Date   BOWEL RESECTION  12/19/2011   Procedure: SMALL BOWEL  RESECTION;  Surgeon: Mariella Saa, MD;  Location: [facility];  Service: General;  Laterality: N/A;  with anastamosis and insertion mesh   BRONCHOSCOPY     COLON SURGERY  01/2004   x 2   COLONOSCOPY W/ BIOPSIES  04/26/2017   per Dr. Leone Payor, no polyps, benign inflammation, repeat in 5 yrs    CYSTOSCOPY     ESOPHAGOGASTRODUODENOSCOPY     HEMICOLECTOMY     left side, at [facility], diverticulitis   HEMORRHOID BANDING     HERNIA REPAIR     [phone] incisional hernia   ILEOSTOMY     ILEOSTOMY CLOSURE     INSERTION OF MESH  07/31/2012   Procedure: INSERTION OF MESH;  Surgeon: Mariella Saa, MD;  Location: [facility];  Service: General;;   LAPAROTOMY  12/19/2011   Procedure: EXPLORATORY LAPAROTOMY;  Surgeon: Mariella Saa, MD;  Location: [facility];  Service: General;  Laterality: N/A;   PACEMAKER IMPLANT N/A 12/01/2020   Procedure: PACEMAKER IMPLANT;  Surgeon: Regan Lemming, MD;  Location: [facility];  Service: Cardiovascular;  Laterality: N/A;   PACEMAKER INSERTION Left    TONSILLECTOMY     UPPER GASTROINTESTINAL ENDOSCOPY  VASECTOMY     VENTRAL HERNIA REPAIR  07/31/2012   Procedure: HERNIA REPAIR VENTRAL ADULT;  Surgeon: Mariella Saa, MD;  Location: [facility];  Service: General;  Laterality: N/A;   Patient Active Problem List   Diagnosis Date Noted   Difficult intravenous access 06/25/2023   Generalized weakness 05/24/2022   Drug overdose, intentional, initial encounter (HCC) 05/24/2022   Chronic pain syndrome 05/24/2022   Chronic anemia    Chronic prostatitis 05/01/2022   Bladder calculi 05/01/2022   Dysuria    Pelvic floor dysfunction 04/27/2022   Crohn's disease of small intestine with other complication (HCC) 02/13/2022   Protein-calorie malnutrition, severe 01/05/2022   Pacemaker 01/03/2022   External hemorrhoid, thrombosed 05/27/2019   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   Long-term use of immunosuppressant medication  -Rinvoq 06/28/2017    Crohn's ileitis (HCC)  05/03/2017   Psoriasis 08/23/2016   Insomnia 04/22/2014   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/06/2012   SVT (supraventricular tachycardia) (HCC) 08/04/2012   Vitamin D deficiency 06/18/2012   Attention deficit hyperactivity disorder (ADHD) 05/28/2009   BPH (benign prostatic hyperplasia) 07/24/2007   Asthma 03/06/2007   GERD 03/06/2007   DIVERTICULITIS, HX OF 03/06/2007    PCP: [name], MD   REFERRING PROVIDER: Iva Boop, MD   REFERRING DIAG: R19.8 (ICD-10-CM) - Abdominal weakness   THERAPY DIAG:  Muscle weakness (generalized) - Plan: PT plan of care cert/re-cert  Cramp and spasm - Plan: PT plan of care cert/re-cert  Generalized abdominal pain - Plan: PT plan of care cert/re-cert  Scar adherent - Plan: PT plan of care cert/re-cert  Abnormal posture - Plan: PT plan of care cert/re-cert  Pelvic pain - Plan: PT plan of care cert/re-cert  Difficulty in walking, not elsewhere classified - Plan: PT plan of care cert/re-cert  Rationale for Evaluation and Treatment: Rehabilitation  ONSET DATE: 2005  SUBJECTIVE:                                                                                                                                                                                           SUBJECTIVE STATEMENT: The dry needling was helpful and did relieve  pressure.  Fluid intake: Yes: does not drink caffeine, water     PAIN:  08/27/23 Are you having pain? Yes: NPRS scale: 5/10 Pain location: abdomen Pain description: constant Aggravating factors: stress Relieving factors: treatment  PAIN:  Are you having pain? Yes NPRS scale: 8/10; 09/17/23 Pain location: between anus and scrotum Pain type: dull and pressure,sharp Pain description: constant more at night  Aggravating factors: sitting, urinating, bowel movement, waking up in the morning.  Relieving factors: walking, moving  PRECAUTIONS: ICD/Pacemaker  RED  FLAGS: None   WEIGHT BEARING RESTRICTIONS: No  FALLS:  Has patient fallen in last 6 months? Yes. Number of falls water main cover was not put back properly and tripped on it and recovered so not due to balance  LIVING ENVIRONMENT: Lives with: lives with their spouse   OCCUPATION: retired  PLOF: Independent  PATIENT GOALS: build endurance back, strength of abdominal wall, reduction of pelvic pain  PERTINENT HISTORY:  Chron's disease of ilium; short bowel syndrome; chronic posterior anal fissure;, small bowel anastomosis's resection x 2, mesh removal x2; ileostomy closure  BOWEL MOVEMENT: Pain with bowel movement: No Type of bowel movement:Type (Bristol Stool Scale) Type 7, Frequency 5, Strain No, and Splinting no  goes multiple times due to short bowel syndrome Fully empty rectum: Yes:   Leakage: Yes: sometimes Pads: Yes: diaper Fiber supplement: No he is not suppose to  URINATION: Pain with urination: Yes Fully empty bladder: Yes:   Stream: Strong Urgency: Yes: sometimes Frequency: every 2 hours Leakage:  none   INTERCOURSE:Not active     OBJECTIVE:  Note: Objective measures were completed at Evaluation unless otherwise noted.   COGNITION: Overall cognitive status: Within functional limits for tasks assessed       FUNCTIONAL TESTS:  6 minute walk test: exertion 5 for 1601 feet 09/17/23 6 minutes walked 1335 feet with exertion of 5 moderate   GAIT: Distance walked: 6 minutes walked 1601 feet with exertion of 5 moderate Assistive device utilized: None Level of assistance: Complete Independence Comments: walks with decreased trunk extension, decreased hip extension 08/01/23.  1304 feet  with exertion scale is 6  POSTURE: rounded shoulders, forward head, decreased lumbar lordosis, increased thoracic kyphosis, posterior pelvic tilt, and flexed trunk   PELVIC ALIGNMENT: ASIS is equal  LUMBARAROM/PROM:  A/PROM A/PROM  eval 07/25/23 08/27/23 09/17/23  Flexion  full full full full  Extension 100% limitation Decreased by 75% Decreased by 50% Decreased by 25%  Right lateral flexion Decreased by 25% Decreased by 25% Decreased by 25% full  Left lateral flexion Decreased by 25% Decreased by 25% full full  Right rotation Decreased by 25% Decreased by 25% full full  Left rotation Decreased by 25% Decreased by 25% Full but painful in right abdominal full   (Blank rows = not tested)    LOWER EXTREMITY MMT:  MMT Right eval Left eval Right 07/25/23 Left  07/25/23 Right/left  08/27/23 Right/left 09/17/23  Hip extension 3/5 3/5 4/5 4/5 4+/5 4+/5  Hip abduction 4/5 3+/5 4/5 4/5 4/5 4+/5   PALPATION:   General  rib cage is restricted, multiple abdominal scars that are restricted. Unable to tighten his stomach and will lift up his rib cage instead.                 External Perineal Exam ischiocavernosus, bulbocavernosus,                              Internal Pelvic Floor tenderness located in bilateral levator ani, obturator internist, coccygeus  Patient confirms identification and approves PT to assess internal pelvic floor and treatment Yes  PELVIC MMT:   MMT eval 09/17/23  Internal Anal Sphincter 1/5 3/5  External Anal Sphincter 1/5 3/5  Puborectalis 2/5 3/5  (Blank rows = not tested)        TONE: increased   TODAY'S TREATMENT:    09/17/23 Manual: Soft tissue  mobilization: Manual work to the bulbocavernosus, ischiocavernosus, perineal body to lengthen the muscles and reduce the trigger points Internal pelvic floor techniques: No emotional/communication barriers or cognitive limitation. Patient is motivated to learn. Patient understands and agrees with treatment goals and plan. PT explains patient will be examined in standing, sitting, and lying down to see how their muscles and joints work. When they are ready, they will be asked to remove their underwear so PT can examine their perineum. The patient is also given the option of providing their own  chaperone as one is not provided in our facility. The patient also has the right and is explained the right to defer or refuse any part of the evaluation or treatment including the internal exam. With the patient's consent, PT will use one gloved finger to gently assess the muscles of the pelvic floor, seeing how well it contracts and relaxes and if there is muscle symmetry. After, the patient will get dressed and PT and patient will discuss exam findings and plan of care. PT and patient discuss plan of care, schedule, attendance policy and HEP activities.  To assess strength but too tender for manual work Therapeutic activities: Functional strengthening activities: Sit on ball to massage the pelvic floor.   08/27/23 Manual: Soft tissue mobilization: Manual work to the diaphragm to improve the movement of the tissue Scar tissue mobilization: Mobilization of the scar on the abdomen to increase mobility of tissue Tissue rolling of the scars to reduce the tightness Neuromuscular re-education: Core retraining: Contraction of the abdomen bringing the lower rib cage downward to make a full contraction Down training: Diaphragmatic breathing lifting the hot pack on his abdomen, tactile cues to not elevate the chest and have the air go into the abdomen.  Exercises: Stretches/mobility: Laying prone on abdomen to increase trunk extension, and elongate the abdominal muscles   08/15/23 Manual: Soft tissue mobilization: To assess for dry needling  Manual work to bilateral ischiocavernosus , right puborectalis, along the iliococcygeus to lengthen after dry needling Internal pelvic floor techniques:  No emotional/communication barriers or cognitive limitation. Patient is motivated to learn. Patient understands and agrees with treatment goals and plan. PT explains patient will be examined in standing, sitting, and lying down to see how their muscles and joints work. When they are ready, they will be asked  to remove their underwear so PT can examine their perineum. The patient is also given the option of providing their own chaperone as one is not provided in our facility. The patient also has the right and is explained the right to defer or refuse any part of the evaluation or treatment including the internal exam. With the patient's consent, PT will use one gloved finger to gently assess the muscles of the pelvic floor, seeing how well it contracts and relaxes and if there is muscle symmetry. After, the patient will get dressed and PT and patient will discuss exam findings and plan of care. PT and patient discuss plan of care, schedule, attendance policy and HEP activities. Therapist finger going through rectal canal to address lengthening of Right sided pelvic floor muscles using diaphragmatic breathing and lengthening techniques Trigger Point Dry Needling Initial Treatment: Pt instructed on Dry Needling rational, procedures, and possible side effects. Pt instructed to expect mild to moderate muscle soreness later in the day and/or into the next day.  Pt instructed in methods to reduce muscle soreness. Pt instructed to continue prescribed HEP. Because Dry Needling was performed over or adjacent to a  lung field, pt was educated on S/S of pneumothorax and to seek immediate medical attention should they occur.  Patient was educated on signs and symptoms of infection and other risk factors and advised to seek medical attention should they occur.  Patient verbalized understanding of these instructions and education.   Patient Verbal Consent Given: Yes Education Handout Provided: Yes Muscles Treated: R side superior transverse perineum, ischiocavernosus, puborectalis, iliococcygeus  Electrical Stimulation Performed: No Treatment Response/Outcome: elongation of muscle and trigger point response Trigger Point Dry Needling  What is Trigger Point Dry Needling (DN)? DN is a physical therapy technique used to  treat muscle pain and dysfunction. Specifically, DN helps deactivate muscle trigger points (muscle knots).  A thin filiform needle is used to penetrate the skin and stimulate the underlying trigger point. The goal is for a local twitch response (LTR) to occur and for the trigger point to relax. No medication of any kind is injected during the procedure.   What Does Trigger Point Dry Needling Feel Like?  The procedure feels different for each individual patient. Some patients report that they do not actually feel the needle enter the skin and overall the process is not painful. Very mild bleeding may occur. However, many patients feel a deep cramping in the muscle in which the needle was inserted. This is the local twitch response.   How Will I feel after the treatment? Soreness is normal, and the onset of soreness may not occur for a few hours. Typically this soreness does not last longer than two days.  Bruising is uncommon, however; ice can be used to decrease any possible bruising.  In rare cases feeling tired or nauseous after the treatment is normal. In addition, your symptoms may get worse before they get better, this period will typically not last longer than 24 hours.   What Can I do After My Treatment? Increase your hydration by drinking more water for the next 24 hours.  You may place ice or heat on the areas treated that have become sore, however, do not use heat on inflamed or bruised areas. Heat often brings more relief post needling. You can continue your regular activities, but vigorous activity is not recommended initially after the treatment for 24 hours. DN is best combined with other physical therapy such as strengthening, stretching, and other therapies.   What are the complications? While your therapist has had extensive training in minimizing the risks of trigger point dry needling, it is important to understand the risks of any procedure.  Risks include bleeding, pain,  fatigue, hematoma, infection, vertigo, nausea or nerve involvement. Monitor for any changes to your skin or sensation. Contact your therapist or MD with concerns.  A rare but serious complication is a pneumothorax over or near your middle and upper chest and back If you have dry needling in this area, monitor for the following symptoms: Shortness of breath on exertion and/or Difficulty taking a deep breath and/or Chest Pain and/or A dry cough If any of the above symptoms develop, please go to the nearest emergency room or call 911. Tell them you had dry needling over your thorax and report any symptoms you are having. Please follow-up with your treating therapist after you complete the medical evaluation. Exercises: Strengthening: Walk 6 minutes 1346 feet with exertion scale is 6 and walked with his dog at the same time.       PATIENT EDUCATION: 08/15/23 Education details: Access Code: [email or web address], educated patient on perineal massage and sent  you tube video, information of dry needling  Person educated: Patient Education method: Explanation, Demonstration, Tactile cues, Verbal cues, and Handouts Education comprehension: verbalized understanding, returned demonstration, verbal cues required, tactile cues required, and needs further education    HOME EXERCISE PROGRAM: 08/08/23 Access Code: [email or web address] URL: [email or web address] Date: 08/08/2023 Prepared by: Eulis Foster  Exercises - Supine Figure 4 Piriformis Stretch  - 1 x daily - 7 x weekly - 1 sets - 2 reps - 30 sec hold - Supine Piriformis Stretch with Leg Straight  - 1 x daily - 7 x weekly - 1 sets - 2 reps - 30 sec hold - Sidelying Thoracic Rotation with Open Book  - 1 x daily - 7 x weekly - 1 sets - 2 reps - 30 sec hold - Prone Diaphragmatic Breathing  - 1 x daily - 7 x weekly - 3 sets - 10 reps - Diaphragmatic Breathing in Child's Pose with Pelvic Floor Relaxation  - 1 x daily - 7 x weekly - 3 sets - 10 reps -  Sidelying Diaphragmatic Breathing  - 1 x daily - 7 x weekly - 3 sets - 10 reps  ASSESSMENT:  CLINICAL IMPRESSION: Patient arrived to PT with 8/10 pelvic floor pain and did 6 minute walk test with 1335 feet with exertion scale of 5. Patient reports his pelvic pain is 25% better. Lumbar extension improved by 50%. His pain decreased from 8/10 to 4/10 after treatment. He has increased strength of the hips. He still has some difficulty filling his abdomen with diaphragmatic breathing. He has tightness in the upper abdomen and the abdominal scars.  He is able to contract his abdominals without lifting his rib cage. Pelvic floor strength is 3/5. He continues to have trigger points in the pelvic floor muscles. Patient will benefit from skilled therapy to improve overall endurance, posture, strength and reduce his pain.   OBJECTIVE IMPAIRMENTS: Abnormal gait, decreased activity tolerance, decreased coordination, decreased endurance, difficulty walking, decreased ROM, decreased strength, increased fascial restrictions, increased muscle spasms, impaired tone, postural dysfunction, and pain.   ACTIVITY LIMITATIONS: sitting, standing, continence, toileting, and locomotion level  PARTICIPATION LIMITATIONS: shopping and community activity  PERSONAL FACTORS: Fitness, Time since onset of injury/illness/exacerbation, and 3+ comorbidities: Chron's disease of ilium; short bowel syndrome; chronic posterior anal fissure;, small bowel anastomosis's resection x 2, mesh removal x2; ileostomy closure  are also affecting patient's functional outcome.   REHAB POTENTIAL: Excellent  CLINICAL DECISION MAKING: Evolving/moderate complexity  EVALUATION COMPLEXITY: Moderate   GOALS: Goals reviewed with patient? Yes  SHORT TERM GOALS: Target date: 06/15/23  Patient independent with initial HEP for strengthening and flexibility.  Baseline: Goal status: MET 07/17/23  2.  Patient understands how to perform scar massage to  lengthen the tissue.  Baseline:  Goal status: MET 07/17/23  3.  Patient reports his abdominal pain decreased >/= 25 % due to reduction of restrictions.  Baseline:  Goal status: Met 08/27/23  4.  Patient is able to contract his abdominals with not lifting his rib cage.  Baseline:  Goal status: Met 08/27/23  5.  Patient has increased lumbar extension by 25% due to increased lumbar ROM and length of his abdominals.  Baseline:  Goal status:MET 07/17/23  6.  Patient able to do the 6 minute walk test with exertion </= 2/10.  Baseline:  Goal status: In progress  LONG TERM GOALS: Target date: 11/30/23  Patient independent with advanced HEP for strength, flexibility, and endurance.  Baseline:  Goal status: Ongoing  09/17/23  2.  Patient pelvic floor strength >/= 3/5 holding for 40 seconds so his fecal leakage decreased >/= 75%.  Baseline:  Goal status: ongoing 09/17/23  3.  Patient is able to walk >/= 1729 feet in 6 minutes with 0-1 exertion due to increased strength and rib mobility.  Baseline:  Goal status: ongoing 09/17/23  4.  Patient is able to stand upright due to increased length of the anterior trunk and lumbar extension improve by 50%.  Baseline: improved by 25% Goal status: Met 09/17/23  5.  Patient has reduction of pelvic pain decreased >/= 50% due to improve abdominal strength and elongation.  Baseline: decreased by 50%  Goal status: met 09/17/23 6.  Patient reports his pain level is </= 5/10 when going to sleep so he is able to fall asleep Baseline:  Goal status: INITIAL    PLAN:  PT FREQUENCY: 2x/week  PT DURATION: 8 weeks  PLANNED INTERVENTIONS: 97110-Therapeutic exercises, 97530- Therapeutic activity, 97112- Neuromuscular re-education, 97140- Manual therapy, 97116- Gait training, 19147- Ultrasound, Patient/Family education, Balance training, Dry Needling, Joint manipulation, Spinal mobilization, Scar mobilization, Cryotherapy, Moist heat, and Biofeedback  PLAN FOR NEXT  SESSION:   manual work to pelvic floor (left side), abdominal work, 6 minute walk test, Back extensor strength.   Eulis Foster, PT 09/17/23 11:47 AM

## 2023-09-18 ENCOUNTER — Ambulatory Visit (INDEPENDENT_AMBULATORY_CARE_PROVIDER_SITE_OTHER): Payer: Medicare Other

## 2023-09-18 DIAGNOSIS — I495 Sick sinus syndrome: Secondary | ICD-10-CM | POA: Diagnosis not present

## 2023-09-19 ENCOUNTER — Telehealth: Payer: Self-pay

## 2023-09-19 NOTE — Telephone Encounter (Signed)
 Due to a scheduling conflict at the office pt was rescheduled from 09/20/2023 to 09/25/2023 at 2:50 PM . Pt made aware. Pt verbalized understanding with all questions answered.

## 2023-09-20 ENCOUNTER — Ambulatory Visit: Payer: Medicare Other | Admitting: Internal Medicine

## 2023-09-20 ENCOUNTER — Other Ambulatory Visit

## 2023-09-20 DIAGNOSIS — Z7960 Long term (current) use of unspecified immunomodulators and immunosuppressants: Secondary | ICD-10-CM

## 2023-09-20 DIAGNOSIS — K50018 Crohn's disease of small intestine with other complication: Secondary | ICD-10-CM

## 2023-09-20 DIAGNOSIS — D696 Thrombocytopenia, unspecified: Secondary | ICD-10-CM

## 2023-09-20 DIAGNOSIS — R1031 Right lower quadrant pain: Secondary | ICD-10-CM

## 2023-09-20 LAB — CUP PACEART REMOTE DEVICE CHECK
Battery Remaining Longevity: 97 mo
Battery Remaining Percentage: 76 %
Battery Voltage: 2.99 V
Brady Statistic AP VP Percent: 11 %
Brady Statistic AP VS Percent: 64 %
Brady Statistic AS VP Percent: 1 %
Brady Statistic AS VS Percent: 24 %
Brady Statistic RA Percent Paced: 75 %
Brady Statistic RV Percent Paced: 11 %
Date Time Interrogation Session: 20250304020021
Implantable Lead Connection Status: 753985
Implantable Lead Connection Status: 753985
Implantable Lead Implant Date: 20220518
Implantable Lead Implant Date: 20220518
Implantable Lead Location: 753859
Implantable Lead Location: 753860
Implantable Lead Model: 1944
Implantable Pulse Generator Implant Date: 20220518
Lead Channel Impedance Value: 460 Ohm
Lead Channel Impedance Value: 510 Ohm
Lead Channel Pacing Threshold Amplitude: 0.375 V
Lead Channel Pacing Threshold Amplitude: 0.625 V
Lead Channel Pacing Threshold Pulse Width: 0.5 ms
Lead Channel Pacing Threshold Pulse Width: 0.5 ms
Lead Channel Sensing Intrinsic Amplitude: 5 mV
Lead Channel Sensing Intrinsic Amplitude: 7.6 mV
Lead Channel Setting Pacing Amplitude: 0.875
Lead Channel Setting Pacing Amplitude: 1.375
Lead Channel Setting Pacing Pulse Width: 0.5 ms
Lead Channel Setting Sensing Sensitivity: 2 mV
Pulse Gen Model: 2272
Pulse Gen Serial Number: 3925308

## 2023-09-25 ENCOUNTER — Encounter: Payer: Self-pay | Admitting: Internal Medicine

## 2023-09-25 ENCOUNTER — Ambulatory Visit (INDEPENDENT_AMBULATORY_CARE_PROVIDER_SITE_OTHER): Admitting: Internal Medicine

## 2023-09-25 VITALS — BP 118/68 | HR 67 | Ht 70.0 in | Wt 162.0 lb

## 2023-09-25 DIAGNOSIS — L309 Dermatitis, unspecified: Secondary | ICD-10-CM

## 2023-09-25 DIAGNOSIS — K50018 Crohn's disease of small intestine with other complication: Secondary | ICD-10-CM

## 2023-09-25 DIAGNOSIS — R197 Diarrhea, unspecified: Secondary | ICD-10-CM

## 2023-09-25 DIAGNOSIS — E781 Pure hyperglyceridemia: Secondary | ICD-10-CM | POA: Diagnosis not present

## 2023-09-25 DIAGNOSIS — Z7960 Long term (current) use of unspecified immunomodulators and immunosuppressants: Secondary | ICD-10-CM

## 2023-09-25 MED ORDER — GERHARDT'S BUTT CREAM: TOPICAL_CREAM | CUTANEOUS | 5 refills | Status: DC

## 2023-09-25 NOTE — Progress Notes (Signed)
 [name] 72 y.o. 1951/05/21 [id number]  Assessment & Plan:   Encounter Diagnoses  Name Primary?   Crohn's disease of small intestine with other complication (HCC) Yes   Long-term use of immunosuppressant medication    Diarrhea, unspecified type    Hypertriglyceridemia    Perianal dermatitis    Since starting prednisone pain is better but he had increased diarrhea for unclear reasons.  I think his diarrhea is multifactorial i.e. status post small bowel resection and short-bowel type issues and Crohn's disease.  He will retry cholestyramine 4 g daily with lunch to see if that helps with the diarrhea.  It was somewhat unpalatable before so he stopped it.  Continue Rinvoq at 30 mg dose, he will let me know when he hears about approval  Retry cholestyramine  Follow-up hypertriglyceridemia with lipids in early April  Treat new perianal dermatitis with the following: Meds ordered this encounter  Medications   Nystatin (GERHARDT'S BUTT CREAM) CREA    Sig: Disp # 1 container Apply to affected area bid    Dispense:  1 each    Refill:  5   He was asking about magnesium and should he stop it.  It is not on his current list.  He will stop that to see if it improves diarrhea.   Follow-up visit with me in May  Subjective:  Gastroenterology problem summary   Crohn's disease of the small intestine-ileitis in a patient status post small bowel resection as below: June 2013 47 cm small bowel resection incarcerated ventral hernia Small intestine, resection - SMALL BOWEL WITH EXTENSIVE ACUTE SEROSITIS AND ASSOCIATED FAT NECROSIS AND ADHESIONS. - NO ATYPIA OR MALIGNANCY. - RESECTION MARGINS VIABLE.   Original Crohn's diagnosis October 2018 colonoscopy, budesonide therapy then 6-MP plus Humira early 2019, developed pancreatitis from 6-MP.  Additional budesonide and then weekly Humira but antibodies very high 04/05/2018 and loss of efficacy. Prednisone used as budesonide was not  helpful, Stelara tried early 2020 and then moved to [facility] fall 2020   2022 having problems with perineal and then abdominal pain and diarrhea progressed into 2023 and in March of that year CT showing enteritis/ileitis referred to Dr. Drue Dun and had surgery       November 11, 2021 small bowel resection, two 9 cm long sections A. SMALL BOWEL ANASTOMOSIS, RESECTION:             - Anastomotic site, present.              - Small bowel mucosa with no significant abnormalities.              - Serosa with fibrous adhesions and histiocytic giant cell reaction to mesh material.    B. MESH, REMOVAL:             - Mesh with attached adipose and membranous tissue, gross examination only.    C. SMALL BOWEL ANASTOMOSIS #2, RESECTION:             - Chronic active ileitis with pseudopolyp formation and mucosa ulceration.  - Anastomotic site identified.  - See comment.    D. MESH #2 , REMOVAL:             - Mesh with attached adipose tissue, gross examination only.    Several admissions due to high output ileostomy and need for hydration and TPN rehab stays, also complicated by abdominal wall abscess requiring drainage.  Was on home TPN.  Dr. Drue Dun did not want to takedown  ileostomy. Was staying with his sister in [place] (she could help with TPN) and saw Dr. Peri JeffersonChancy Hurter at [facility]   08/29/2022 preoperative colonoscopy mild diversion colitis noted throughout the colon exam to long Hartmann stump and   08/30/2022 ileostomy reversal at [facility]      Small intestine (ileostomy trim): -Benign skin small intestinal tissue consistent with ostomy site.   This electronic signature is attestation that the pathologist personally reviewed the submitted material(s) and the final diagnosis reflects that evaluation.  Electronically signed by Rogelia Mire, MD on 08/31/2022 at 10:24 AM  Clinical History    Pre-op diagnosis: CROHN'S DISEASE  Gross Description    A.  Label: Small intestine  (ileostomy trim)-permanent Stoma size: 2.2 x 2.1 cm Surrounding skin: 0.5 cm wide rim of lightly pigmented skin Attached bowel: 1.5 cm in length by 1.7 cm in diameter; unremarkable Other findings: An additional 4.5 x 2.2 x 1.5 cm mucosal fragment is present; unremarkable    Postop.  Outpatient, he struggled with hydration some Lomotil helping, some question of short-bowel syndrome consideration for TPN but not used     CT-E 11/08/22 IMPRESSION: 1. Interval ileostomy reversal and ileocolic anastomosis. 2. Multiple distal small bowel and colon resections with the remaining portions of the colon situated in the right hemiabdomen. 3. Mild fat stranding about the ileocolic anastomosis and adjacent descending portion of the colon in the right lower quadrant, similar to prior examination. This may be postoperative or reflect ongoing inflammation. 4. No other inflammatory findings of the bowel. No evidence of stricture, obstruction, fistula, or abscess at this time.    Multiple deficiencies-thiamine, potassium iron, vitamin D detected 10/04/2022 follow-up   April and May 2024 visits-Entyvio antibody testing negative, decided to treat with Rinvoq.  He is awaiting that.  Calprotectin 275 in April.  C-reactive protein normal.  Thiamine was low and responded to therapy.  Ferritin 22 B12 and folate normal.  On iron.   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 7 days; 6-MP discontinued 2/2 pancreatitis 2020 - Trial of Stelara (unresponsive) and subsequently Entyvio with improvement 2022 - Perineal/abdominal pain with active inflammation on CT 10/2021 [facility]) - Ex lap, LOA, mesh removal, ileocolic resection, end ileostomy -managed on Entyvio postop 02/2022 [facility]) - EC fistula takedown c/b high output ileostomy and FTT requiring TPN 08/2022 ([facility]) -Ileostomy takedown 12/2022 - Induction Rinvoq 15 mg po daily February 2024 increased Rinvoq to 30 mg daily, prednisone taper started-CT scanning 09/03/2023 showed mild stranding of adjacent fat near the small bowel and he was having increasing right lower quadrant pain   ----------------------------------------------------------------------------------------------  Chief Complaint: Follow-up of Crohn's disease  HPI 73 year old man with a history of Crohn's disease and prior surgeries as outlined above who presents in follow-up last seen 09/07/2023 at which point Rinvoq was increased to 30 mg daily (using to 15 mg while we wait for approval) and prednisone taper.  Appetite and energy level markedly better on prednisone.  On February 21 he was having less diarrhea with only several stools a day and some were forming up.  Then he developed watery stools greater than 10 a day he is having some perianal irritation and soreness.  He tried Calmoseptine but that was irritating.  He has asked for a refill of his diltiazem and lidocaine ointment which he lost or ran out of.  Stools now down to about 8 a day.  Weight is up a few pounds.  The abdominal pain  is significantly better.  Wt Readings from Last 3 Encounters:  09/25/23 162 lb (73.5 kg)  09/07/23 157 lb 6.4 oz (71.4 kg)  09/03/23 157 lb (71.2 kg)     Allergies  Allergen Reactions   Purinethol [Mercaptopurine] Other (See Comments)    Pancreatitis    Shellfish Allergy Anaphylaxis and Swelling    Can use standard SMOF lipid formulation for TPN without any issue.    Humira [Adalimumab] Other (See Comments)    Developed antibodies   Tape Rash   Wound Dressing Adhesive Rash   Current Meds  Medication Sig   acetaminophen (TYLENOL) 325 MG tablet Take 650 mg by mouth every 6 (six) hours as needed for moderate pain.   albuterol (VENTOLIN HFA) 108 (90 Base) MCG/ACT inhaler Inhale 2 puffs into the lungs every 6 (six) hours as needed for wheezing or shortness of breath.   AMBULATORY NON FORMULARY MEDICATION Medication Name: nifedipine 0.2%/lidocaine 5% ointment 1:1 mix Apply 3-4 tmes/day into anal canal   ascorbic acid (VITAMIN C) 250 MG CHEW Chew by mouth.   busPIRone (BUSPAR) 10 MG tablet Take 10 mg by mouth every morning.   calcium carbonate (TUMS - DOSED IN MG ELEMENTAL CALCIUM) 500 MG chewable tablet Chew 500 mg by mouth 3 (three) times daily as needed for indigestion or heartburn.   Cholecalciferol (VITAMIN D) 50 MCG (2000 UT) tablet Take 1 tablet (2,000 Units total) by mouth daily.   cholestyramine (QUESTRAN) 4 g packet Take 1 packet (4 g total) by mouth daily with lunch.   diphenoxylate-atropine (LOMOTIL) 2.5-0.025 MG tablet Take 2 tablets by mouth 4 (four) times daily.   ferrous sulfate 325 (65 FE) MG tablet Take 1 tablet (325 mg total) by mouth daily with breakfast.   finasteride (PROSCAR) 5 MG tablet Take 1 tablet (5 mg total) by mouth daily.   gabapentin (NEURONTIN) 100 MG capsule Take 1 capsule (100 mg total) by mouth at bedtime.   HYDROcodone-acetaminophen (NORCO) 5-325 MG tablet Take 1 tablet by mouth every 8 (eight) hours as needed for moderate pain (pain score 4-6).    hyoscyamine (LEVSIN SL) 0.125 MG SL tablet Place 0.125 mg under the tongue every 6 (six) hours as needed for cramping.   melatonin 3 MG TABS tablet Take 3 mg by mouth at bedtime.   Multiple Vitamin (MULTIVITAMIN WITH MINERALS) TABS tablet Take 1 tablet by mouth daily.   Nystatin (GERHARDT'S BUTT CREAM) CREA Disp # 1 container Apply to affected area bid   ondansetron (ZOFRAN-ODT) 4 MG disintegrating tablet Dissolve 1 tablet (4 mg total) by mouth every 6 (six) hours as needed for nausea or vomiting.  phenazopyridine (PYRIDIUM) 200 MG tablet TAKE 1 TABLET(200 MG) BY MOUTH THREE TIMES DAILY AS NEEDED FOR URINARY PAIN   Potassium Chloride ER 20 MEQ TBCR TAKE 1 TABLET(20 MEQ) BY MOUTH TWICE DAILY   predniSONE (DELTASONE) 10 MG tablet Take 4 tablets (40 mg total) by mouth daily for 10 days, THEN 3 tablets (30 mg total) daily for 10 days, THEN 2 tablets (20 mg total) daily for 10 days, THEN 1 tablet (10 mg total) daily for 10 days.   rifaximin (XIFAXAN) 550 MG TABS tablet Take 1 tablet (550 mg total) by mouth 3 (three) times daily.   simethicone (MYLICON) 80 MG chewable tablet Chew 80 mg by mouth 4 (four) times daily -  with meals and at bedtime.   thiamine (VITAMIN B1) 100 MG tablet Take 1 tablet (100 mg total) by mouth daily.   traZODone (DESYREL) 100 MG tablet Take 1 tablet (100 mg total) by mouth at bedtime.   Upadacitinib ER (RINVOQ) 30 MG TB24 Take 1 tablet (30 mg total) by mouth daily.   valACYclovir (VALTREX) 1000 MG tablet Take one tablet 3 times a day for 7 days as needed for shingles flares   Past Medical History:  Diagnosis Date   Acute prostatitis 07/24/2007   Qualifier: Diagnosis of  By: Clent Ridges MD, Jeannett Senior A    Acute respiratory failure with hypoxia (HCC)    Allergy    mild   Arthritis    osteoarthritis   Aspiration pneumonia (HCC) 05/24/2022   Asthma    Blood transfusion without reported diagnosis    BPH (benign prostatic hypertrophy) with urinary obstruction    Crohn's ileitis (HCC)  suspected 05/03/2017   Dilated aortic root (HCC)    noted on echo 08/2012   Diverticulitis of colon    Drug-induced acute pancreatitis without infection or necrosis - from 6 MP 09/24/2017   EPIDIDYMITIS 02/15/2010   Qualifier: Diagnosis of  By: Clent Ridges MD, Tera Mater    GERD (gastroesophageal reflux disease)    H/O: GI bleed    Hemorrhoids    Hepatitis 1975   unknown type    HERPES SIMPLEX INFECTION 10/14/2007   Qualifier: Diagnosis of  By: Clent Ridges MD, Jeannett Senior A    Hiatal hernia    Ileus following gastrointestinal surgery (HCC) 12/26/2011   Long term (current) use of systemic steroids 06/18/2018   Psoriasis    sees Dr. Karene Fry at [facility].   Rectal fissure    Recurrent ventral incisional hernia 05/10/2012   Shingles    Short bowel syndrome    SVT (supraventricular tachycardia) (HCC)    Ulcer 08/21/2013   ileal   Past Surgical History:  Procedure Laterality Date   BOWEL RESECTION  12/19/2011   Procedure: SMALL BOWEL RESECTION;  Surgeon: Mariella Saa, MD;  Location: [facility];  Service: General;  Laterality: N/A;  with anastamosis and insertion mesh   BRONCHOSCOPY     COLON SURGERY  01/2004   x 2   COLONOSCOPY W/ BIOPSIES  04/26/2017   per Dr. Leone Payor, no polyps, benign inflammation, repeat in 5 yrs    CYSTOSCOPY     ESOPHAGOGASTRODUODENOSCOPY     HEMICOLECTOMY     left side, at [facility], diverticulitis   HEMORRHOID BANDING     HERNIA REPAIR     [id number] incisional hernia   ILEOSTOMY     ILEOSTOMY CLOSURE     INSERTION OF MESH  07/31/2012   Procedure: INSERTION OF MESH;  Surgeon: Mariella Saa, MD;  Location:  [facility];  Service: General;;   LAPAROTOMY  12/19/2011   Procedure: EXPLORATORY LAPAROTOMY;  Surgeon: Mariella Saa, MD;  Location: [facility];  Service: General;  Laterality: N/A;   PACEMAKER IMPLANT N/A 12/01/2020   Procedure: PACEMAKER IMPLANT;  Surgeon: Regan Lemming, MD;  Location: [facility];  Service: Cardiovascular;  Laterality: N/A;    PACEMAKER INSERTION Left    TONSILLECTOMY     UPPER GASTROINTESTINAL ENDOSCOPY     VASECTOMY     VENTRAL HERNIA REPAIR  07/31/2012   Procedure: HERNIA REPAIR VENTRAL ADULT;  Surgeon: Mariella Saa, MD;  Location: [facility];  Service: General;  Laterality: N/A;   Social History   Social History Narrative   He is married with 2 sons 1 son is an Acupuncturist and the 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   Review of Systems As per HPI  Objective:   Physical Exam BP 118/68   Pulse 67   Ht 5\' 10"  (1.778 m)   Wt 162 lb (73.5 kg)   BMI 23.24 kg/m   Thin NAD Abd soft NT  Perianal inspection-images

## 2023-09-25 NOTE — Patient Instructions (Addendum)
 Please come fasting in April  and have your lipid panel drawn. The lab is open 7:30AM-5:15PM. No appointment needed.   We are providing you with a printed rx for Gerhardt's Butt Cream to take to your pharmacy. If unable to get it use over the counter Desitin.   Re-try the Cholestyramine.   Let us know when the 30mg  Rinvoq is approved.     I appreciate the opportunity to care for you. Stan Head, MD, [facility]

## 2023-09-26 ENCOUNTER — Encounter: Payer: Self-pay | Admitting: Physical Therapy

## 2023-09-26 ENCOUNTER — Ambulatory Visit: Payer: Self-pay | Admitting: Physical Therapy

## 2023-09-26 ENCOUNTER — Other Ambulatory Visit (HOSPITAL_COMMUNITY): Payer: Self-pay

## 2023-09-26 DIAGNOSIS — L905 Scar conditions and fibrosis of skin: Secondary | ICD-10-CM

## 2023-09-26 DIAGNOSIS — M6281 Muscle weakness (generalized): Secondary | ICD-10-CM | POA: Diagnosis not present

## 2023-09-26 DIAGNOSIS — R1084 Generalized abdominal pain: Secondary | ICD-10-CM

## 2023-09-26 DIAGNOSIS — R252 Cramp and spasm: Secondary | ICD-10-CM

## 2023-09-26 DIAGNOSIS — R102 Pelvic and perineal pain: Secondary | ICD-10-CM

## 2023-09-26 DIAGNOSIS — R293 Abnormal posture: Secondary | ICD-10-CM

## 2023-09-26 DIAGNOSIS — R262 Difficulty in walking, not elsewhere classified: Secondary | ICD-10-CM

## 2023-09-26 MED ORDER — GERHARDT'S BUTT CREAM
TOPICAL_CREAM | Freq: Two times a day (BID) | CUTANEOUS | 5 refills | Status: DC
  Filled 2023-09-26: qty 60, 10d supply, fill #0

## 2023-09-26 NOTE — Therapy (Signed)
 OUTPATIENT PHYSICAL THERAPY MALE PELVIC TREATMENT   Patient Name: [name] MRN: [id number] DOB:January 20, 1951, 73 y.o., male Today's Date: 09/26/2023    END OF SESSION:  PT End of Session - 09/26/23 1230     Visit Number 12    Date for PT Re-Evaluation 11/30/23    Authorization Type 11    Authorization - Visit Number 11    Authorization - Number of Visits 20    PT Start Time 1230    PT Stop Time 1310    PT Time Calculation (min) 40 min    Activity Tolerance Patient tolerated treatment well    Behavior During Therapy [facility] for tasks assessed/performed              Past Medical History:  Diagnosis Date   Acute prostatitis 07/24/2007   Qualifier: Diagnosis of  By: Clent Ridges MD, Jeannett Senior A    Acute respiratory failure with hypoxia (HCC)    Allergy    mild   Arthritis    osteoarthritis   Aspiration pneumonia (HCC) 05/24/2022   Asthma    Blood transfusion without reported diagnosis    BPH (benign prostatic hypertrophy) with urinary obstruction    Crohn's ileitis (HCC) suspected 05/03/2017   Dilated aortic root (HCC)    noted on echo 08/2012   Diverticulitis of colon    Drug-induced acute pancreatitis without infection or necrosis - from 6 MP 09/24/2017   EPIDIDYMITIS 02/15/2010   Qualifier: Diagnosis of  By: Clent Ridges MD, Tera Mater    GERD (gastroesophageal reflux disease)    H/O: GI bleed    Hemorrhoids    Hepatitis 1975   unknown type    HERPES SIMPLEX INFECTION 10/14/2007   Qualifier: Diagnosis of  By: Clent Ridges MD, Jeannett Senior A    Hiatal hernia    Ileus following gastrointestinal surgery (HCC) 12/26/2011   Long term (current) use of systemic steroids 06/18/2018   Psoriasis    sees Dr. Karene Fry at [facility].   Rectal fissure    Recurrent ventral incisional hernia 05/10/2012   Shingles    Short bowel syndrome    SVT (supraventricular tachycardia) (HCC)    Ulcer 08/21/2013   ileal   Past Surgical History:  Procedure Laterality Date   BOWEL RESECTION  12/19/2011    Procedure: SMALL BOWEL RESECTION;  Surgeon: Mariella Saa, MD;  Location: [facility];  Service: General;  Laterality: N/A;  with anastamosis and insertion mesh   BRONCHOSCOPY     COLON SURGERY  01/2004   x 2   COLONOSCOPY W/ BIOPSIES  04/26/2017   per Dr. Leone Payor, no polyps, benign inflammation, repeat in 5 yrs    CYSTOSCOPY     ESOPHAGOGASTRODUODENOSCOPY     HEMICOLECTOMY     left side, at [facility], diverticulitis   HEMORRHOID BANDING     HERNIA REPAIR     [phone] incisional hernia   ILEOSTOMY     ILEOSTOMY CLOSURE     INSERTION OF MESH  07/31/2012   Procedure: INSERTION OF MESH;  Surgeon: Mariella Saa, MD;  Location: [facility];  Service: General;;   LAPAROTOMY  12/19/2011   Procedure: EXPLORATORY LAPAROTOMY;  Surgeon: Mariella Saa, MD;  Location: [facility];  Service: General;  Laterality: N/A;   PACEMAKER IMPLANT N/A 12/01/2020   Procedure: PACEMAKER IMPLANT;  Surgeon: Regan Lemming, MD;  Location: [facility];  Service: Cardiovascular;  Laterality: N/A;   PACEMAKER INSERTION Left    TONSILLECTOMY     UPPER  GASTROINTESTINAL ENDOSCOPY     VASECTOMY     VENTRAL HERNIA REPAIR  07/31/2012   Procedure: HERNIA REPAIR VENTRAL ADULT;  Surgeon: Mariella Saa, MD;  Location: [facility];  Service: General;  Laterality: N/A;   Patient Active Problem List   Diagnosis Date Noted   Difficult intravenous access 06/25/2023   Generalized weakness 05/24/2022   Drug overdose, intentional, initial encounter (HCC) 05/24/2022   Chronic pain syndrome 05/24/2022   Chronic anemia    Chronic prostatitis 05/01/2022   Bladder calculi 05/01/2022   Dysuria    Pelvic floor dysfunction 04/27/2022   Crohn's disease of small intestine with other complication (HCC) 02/13/2022   Protein-calorie malnutrition, severe 01/05/2022   Pacemaker 01/03/2022   External hemorrhoid, thrombosed 05/27/2019   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   Long-term use of immunosuppressant medication   -Rinvoq 06/28/2017   Crohn's ileitis (HCC)  05/03/2017   Psoriasis 08/23/2016   Insomnia 04/22/2014   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/06/2012   SVT (supraventricular tachycardia) (HCC) 08/04/2012   Vitamin D deficiency 06/18/2012   Attention deficit hyperactivity disorder (ADHD) 05/28/2009   BPH (benign prostatic hyperplasia) 07/24/2007   Asthma 03/06/2007   GERD 03/06/2007   DIVERTICULITIS, HX OF 03/06/2007    PCP: [name], MD   REFERRING PROVIDER: Iva Boop, MD   REFERRING DIAG: R19.8 (ICD-10-CM) - Abdominal weakness   THERAPY DIAG:  Muscle weakness (generalized)  Cramp and spasm  Generalized abdominal pain  Scar adherent  Abnormal posture  Pelvic pain  Difficulty in walking, not elsewhere classified  Rationale for Evaluation and Treatment: Rehabilitation  ONSET DATE: 2005  SUBJECTIVE:                                                                                                                                                                                           SUBJECTIVE STATEMENT: I have had increased in pelvic pain due to diaper rash, and flare-up with chron's.  Fluid intake: Yes: does not drink caffeine, water     PAIN:  09/26/23 Are you having pain? Yes: NPRS scale: 6/10 Pain location: abdomen Pain description: constant Aggravating factors: stress Relieving factors: treatment  PAIN:  09/26/23 Are you having pain? Yes NPRS scale: 8/10; 09/17/23 Pain location: between anus and scrotum Pain type: dull and pressure,sharp Pain description: constant more at night  Aggravating factors: sitting, urinating, bowel movement, waking up in the morning.  Relieving factors: walking, moving  PRECAUTIONS: ICD/Pacemaker  RED FLAGS: None   WEIGHT BEARING RESTRICTIONS: No  FALLS:  Has patient fallen in last 6 months? Yes. Number of falls water main cover was not put  back properly and tripped on it and recovered so not due to  balance  LIVING ENVIRONMENT: Lives with: lives with their spouse   OCCUPATION: retired  PLOF: Independent  PATIENT GOALS: build endurance back, strength of abdominal wall, reduction of pelvic pain  PERTINENT HISTORY:  Chron's disease of ilium; short bowel syndrome; chronic posterior anal fissure;, small bowel anastomosis's resection x 2, mesh removal x2; ileostomy closure  BOWEL MOVEMENT: Pain with bowel movement: No Type of bowel movement:Type (Bristol Stool Scale) Type 7, Frequency 5, Strain No, and Splinting no  goes multiple times due to short bowel syndrome Fully empty rectum: Yes:   Leakage: Yes: sometimes Pads: Yes: diaper Fiber supplement: No he is not suppose to  URINATION: Pain with urination: Yes Fully empty bladder: Yes:   Stream: Strong Urgency: Yes: sometimes Frequency: every 2 hours Leakage:  none   INTERCOURSE:Not active     OBJECTIVE:  Note: Objective measures were completed at Evaluation unless otherwise noted.   COGNITION: Overall cognitive status: Within functional limits for tasks assessed       FUNCTIONAL TESTS:  6 minute walk test: exertion 5 for 1601 feet 09/17/23 6 minutes walked 1335 feet with exertion of 5 moderate 09/26/23 6 minute walk test 1491 feet with exertion for 4   GAIT: Distance walked: 6 minutes walked 1601 feet with exertion of 5 moderate Assistive device utilized: None Level of assistance: Complete Independence Comments: walks with decreased trunk extension, decreased hip extension 08/01/23.  1304 feet  with exertion scale is 6  POSTURE: rounded shoulders, forward head, decreased lumbar lordosis, increased thoracic kyphosis, posterior pelvic tilt, and flexed trunk   PELVIC ALIGNMENT: ASIS is equal  LUMBARAROM/PROM:  A/PROM A/PROM  eval 07/25/23 08/27/23 09/17/23  Flexion full full full full  Extension 100% limitation Decreased by 75% Decreased by 50% Decreased by 25%  Right lateral flexion Decreased by 25% Decreased  by 25% Decreased by 25% full  Left lateral flexion Decreased by 25% Decreased by 25% full full  Right rotation Decreased by 25% Decreased by 25% full full  Left rotation Decreased by 25% Decreased by 25% Full but painful in right abdominal full   (Blank rows = not tested)    LOWER EXTREMITY MMT:  MMT Right eval Left eval Right 07/25/23 Left  07/25/23 Right/left  08/27/23 Right/left 09/17/23  Hip extension 3/5 3/5 4/5 4/5 4+/5 4+/5  Hip abduction 4/5 3+/5 4/5 4/5 4/5 4+/5   PALPATION:   General  rib cage is restricted, multiple abdominal scars that are restricted. Unable to tighten his stomach and will lift up his rib cage instead.                 External Perineal Exam ischiocavernosus, bulbocavernosus,                              Internal Pelvic Floor tenderness located in bilateral levator ani, obturator internist, coccygeus  Patient confirms identification and approves PT to assess internal pelvic floor and treatment Yes  PELVIC MMT:   MMT eval 09/17/23  Internal Anal Sphincter 1/5 3/5  External Anal Sphincter 1/5 3/5  Puborectalis 2/5 3/5  (Blank rows = not tested)        TONE: increased   TODAY'S TREATMENT:    09/26/23 Manual: Scar tissue mobilization: Scar massage to improve tissue mobility Neuromuscular re-education: Core facilitation: Prone bilateral shoulder horizontal abduction to increase thoracic extension and opening up the stomach Prone  with hands behind head and lift elbows Prone bilateral arms at his side and bilateral shoulder extension  09/17/23 Manual: Soft tissue mobilization: Manual work to the bulbocavernosus, ischiocavernosus, perineal body to lengthen the muscles and reduce the trigger points Internal pelvic floor techniques: No emotional/communication barriers or cognitive limitation. Patient is motivated to learn. Patient understands and agrees with treatment goals and plan. PT explains patient will be examined in standing, sitting, and lying  down to see how their muscles and joints work. When they are ready, they will be asked to remove their underwear so PT can examine their perineum. The patient is also given the option of providing their own chaperone as one is not provided in our facility. The patient also has the right and is explained the right to defer or refuse any part of the evaluation or treatment including the internal exam. With the patient's consent, PT will use one gloved finger to gently assess the muscles of the pelvic floor, seeing how well it contracts and relaxes and if there is muscle symmetry. After, the patient will get dressed and PT and patient will discuss exam findings and plan of care. PT and patient discuss plan of care, schedule, attendance policy and HEP activities.  To assess strength but too tender for manual work Therapeutic activities: Functional strengthening activities: Sit on ball to massage the pelvic floor.   08/27/23 Manual: Soft tissue mobilization: Manual work to the diaphragm to improve the movement of the tissue Scar tissue mobilization: Mobilization of the scar on the abdomen to increase mobility of tissue Tissue rolling of the scars to reduce the tightness Neuromuscular re-education: Core retraining: Contraction of the abdomen bringing the lower rib cage downward to make a full contraction Down training: Diaphragmatic breathing lifting the hot pack on his abdomen, tactile cues to not elevate the chest and have the air go into the abdomen.  Exercises: Stretches/mobility: Laying prone on abdomen to increase trunk extension, and elongate the abdominal muscles       PATIENT EDUCATION: 08/15/23 Education details: Access Code: [email or web address], educated patient on perineal massage and sent you tube video, information of dry needling  Person educated: Patient Education method: Explanation, Demonstration, Tactile cues, Verbal cues, and Handouts Education comprehension: verbalized understanding,  returned demonstration, verbal cues required, tactile cues required, and needs further education    HOME EXERCISE PROGRAM: 08/08/23 Access Code: [email or web address] URL: [email or web address] Date: 08/08/2023 Prepared by: Eulis Foster  Exercises - Supine Figure 4 Piriformis Stretch  - 1 x daily - 7 x weekly - 1 sets - 2 reps - 30 sec hold - Supine Piriformis Stretch with Leg Straight  - 1 x daily - 7 x weekly - 1 sets - 2 reps - 30 sec hold - Sidelying Thoracic Rotation with Open Book  - 1 x daily - 7 x weekly - 1 sets - 2 reps - 30 sec hold - Prone Diaphragmatic Breathing  - 1 x daily - 7 x weekly - 3 sets - 10 reps - Diaphragmatic Breathing in Child's Pose with Pelvic Floor Relaxation  - 1 x daily - 7 x weekly - 3 sets - 10 reps - Sidelying Diaphragmatic Breathing  - 1 x daily - 7 x weekly - 3 sets - 10 reps  ASSESSMENT:  CLINICAL IMPRESSION: Patient arrived to PT with 8/10 pelvic floor pain and did 6 minute walk test with 1491 feet with exertion scale of 4. Patient is having a flare-up of his Chron's so his rectum is sore. He  is going to get butt cream to help. Patient has improved mobility of his abdominal scar. He is working on back extensor strength to also elongate anterior trunk. Patient has to stop therapy due to feeling dizzy from low heart rate.  Patient will benefit from skilled therapy to improve overall endurance, posture, strength and reduce his pain.   OBJECTIVE IMPAIRMENTS: Abnormal gait, decreased activity tolerance, decreased coordination, decreased endurance, difficulty walking, decreased ROM, decreased strength, increased fascial restrictions, increased muscle spasms, impaired tone, postural dysfunction, and pain.   ACTIVITY LIMITATIONS: sitting, standing, continence, toileting, and locomotion level  PARTICIPATION LIMITATIONS: shopping and community activity  PERSONAL FACTORS: Fitness, Time since onset of injury/illness/exacerbation, and 3+ comorbidities: Chron's  disease of ilium; short bowel syndrome; chronic posterior anal fissure;, small bowel anastomosis's resection x 2, mesh removal x2; ileostomy closure  are also affecting patient's functional outcome.   REHAB POTENTIAL: Excellent  CLINICAL DECISION MAKING: Evolving/moderate complexity  EVALUATION COMPLEXITY: Moderate   GOALS: Goals reviewed with patient? Yes  SHORT TERM GOALS: Target date: 06/15/23  Patient independent with initial HEP for strengthening and flexibility.  Baseline: Goal status: MET 07/17/23  2.  Patient understands how to perform scar massage to lengthen the tissue.  Baseline:  Goal status: MET 07/17/23  3.  Patient reports his abdominal pain decreased >/= 25 % due to reduction of restrictions.  Baseline:  Goal status: Met 08/27/23  4.  Patient is able to contract his abdominals with not lifting his rib cage.  Baseline:  Goal status: Met 08/27/23  5.  Patient has increased lumbar extension by 25% due to increased lumbar ROM and length of his abdominals.  Baseline:  Goal status:MET 07/17/23  6.  Patient able to do the 6 minute walk test with exertion </= 2/10.  Baseline:  Goal status: In progress  LONG TERM GOALS: Target date: 11/30/23  Patient independent with advanced HEP for strength, flexibility, and endurance.  Baseline:  Goal status: Ongoing 09/17/23  2.  Patient pelvic floor strength >/= 3/5 holding for 40 seconds so his fecal leakage decreased >/= 75%.  Baseline:  Goal status: ongoing 09/17/23  3.  Patient is able to walk >/= 1729 feet in 6 minutes with 0-1 exertion due to increased strength and rib mobility.  Baseline:  Goal status: ongoing 09/17/23  4.  Patient is able to stand upright due to increased length of the anterior trunk and lumbar extension improve by 50%.  Baseline: improved by 25% Goal status: Met 09/17/23  5.  Patient has reduction of pelvic pain decreased >/= 50% due to improve abdominal strength and elongation.  Baseline: decreased  by 50%  Goal status: met 09/17/23 6.  Patient reports his pain level is </= 5/10 when going to sleep so he is able to fall asleep Baseline:  Goal status: INITIAL    PLAN:  PT FREQUENCY: 2x/week  PT DURATION: 8 weeks  PLANNED INTERVENTIONS: 97110-Therapeutic exercises, 97530- Therapeutic activity, 97112- Neuromuscular re-education, 97140- Manual therapy, 97116- Gait training, 66440- Ultrasound, Patient/Family education, Balance training, Dry Needling, Joint manipulation, Spinal mobilization, Scar mobilization, Cryotherapy, Moist heat, and Biofeedback  PLAN FOR NEXT SESSION:   manual work to pelvic floor (left side), abdominal work, 6 minute walk test, Back extensor strength.   Eulis Foster, PT 09/26/23 1:18 PM

## 2023-09-27 ENCOUNTER — Ambulatory Visit: Payer: Medicare Other

## 2023-09-27 ENCOUNTER — Ambulatory Visit (INDEPENDENT_AMBULATORY_CARE_PROVIDER_SITE_OTHER): Payer: Medicare Other | Admitting: Physician Assistant

## 2023-09-27 ENCOUNTER — Encounter: Payer: Self-pay | Admitting: Internal Medicine

## 2023-09-27 ENCOUNTER — Other Ambulatory Visit (HOSPITAL_COMMUNITY): Payer: Self-pay

## 2023-09-27 ENCOUNTER — Encounter: Payer: Self-pay | Admitting: Physician Assistant

## 2023-09-27 ENCOUNTER — Other Ambulatory Visit: Payer: Self-pay

## 2023-09-27 VITALS — BP 134/74 | HR 79 | Resp 18 | Wt 162.0 lb

## 2023-09-27 DIAGNOSIS — R413 Other amnesia: Secondary | ICD-10-CM

## 2023-09-27 LAB — CALPROTECTIN: Calprotectin: 99 ug/g

## 2023-09-27 NOTE — Addendum Note (Signed)
 Addended by: Marlowe Kays E on: 09/27/2023 03:48 PM   Modules accepted: Level of Service

## 2023-09-27 NOTE — Telephone Encounter (Signed)
 PAP: Patient assistance application for Rinvoq has been approved by PAP Companies: [employer] from 09/17/2023 to 07/16/2024. Medication should be delivered to PAP Delivery: as specified on APP. For further shipping updates, please contact [employer] at [phone]. Patient ID is: not provided

## 2023-09-27 NOTE — Patient Instructions (Signed)
 It was a pleasure to see you today at our office.   Recommendations:  Neurocognitive evaluation at our office   MRI of the brain, the radiology office will call you to arrange you appointment   Check labs Follow up pending on the results of the neuropsych evaluation   Recommend visiting the website : " Dementia [facility]" to better understand some behaviors related to memory loss.    For psychiatric meds, mood meds: Please have your primary care physician manage these medications.  If you have any severe symptoms of a stroke, or other severe issues such as confusion,severe chills or fever, etc call 911 or go to the ER as you may need to be evaluated further   For guidance regarding [facility] and if placement were needed at the facility, contact Social Worker tel: [phone]  For assessment of decision of mental capacity and competency:  Call Dr. Erick Blinks, geriatric psychiatrist at [phone]  Counseling regarding caregiver distress, including caregiver depression, anxiety and issues regarding community resources, adult day care programs, adult living facilities, or memory care questions:  please contact your  Primary Doctor's Social Worker   Whom to call: Memory  decline, memory medications: Call our office [phone]    [email or web address]   RECOMMENDATIONS FOR ALL PATIENTS WITH MEMORY PROBLEMS: 1. Continue to exercise (Recommend 30 minutes of walking everyday, or 3 hours every week) 2. Increase social interactions - continue going to [place] and enjoy social gatherings with friends and family 3. Eat healthy, avoid fried foods and eat more fruits and vegetables 4. Maintain adequate blood pressure, blood sugar, and blood cholesterol level. Reducing the risk of stroke and cardiovascular disease also helps promoting better memory. 5. Avoid stressful situations. Live a simple life and avoid  aggravations. Organize your time and prepare for the next day in anticipation. 6. Sleep well, avoid any interruptions of sleep and avoid any distractions in the bedroom that may interfere with adequate sleep quality 7. Avoid sugar, avoid sweets as there is a strong link between excessive sugar intake, diabetes, and cognitive impairment We discussed the Mediterranean diet, which has been shown to help patients reduce the risk of progressive memory disorders and reduces cardiovascular risk. This includes eating fish, eat fruits and green leafy vegetables, nuts like almonds and hazelnuts, walnuts, and also use olive oil. Avoid fast foods and fried foods as much as possible. Avoid sweets and sugar as sugar use has been linked to worsening of memory function.  There is always a concern of gradual progression of memory problems. If this is the case, then we may need to adjust level of care according to patient needs. Support, both to the patient and caregiver, should then be put into place.      You have been referred for a neuropsychological evaluation (i.e., evaluation of memory and thinking abilities). Please bring someone with you to this appointment if possible, as it is helpful for the doctor to hear from both you and another adult who knows you well. Please bring eyeglasses and hearing aids if you wear them.    The evaluation will take approximately 3 hours and has two parts:   The first part is a clinical interview with the neuropsychologist (Dr. Milbert Coulter or Dr. Robbie Lis). During the interview, the neuropsychologist will speak with you and the individual you brought to the appointment.    The second part of the evaluation is testing with the doctor's technician Annabelle Harman or Selena Batten). During the testing, the technician  will ask you to remember different types of material, solve problems, and answer some questionnaires. Your family member will not be present for this portion of the evaluation.   Please note: We  must reserve several hours of the neuropsychologist's time and the psychometrician's time for your evaluation appointment. As such, there is a No-Show fee of $100. If you are unable to attend any of your appointments, please contact our office as soon as possible to reschedule.      DRIVING: Regarding driving, in patients with progressive memory problems, driving will be impaired. We advise to have someone else do the driving if trouble finding directions or if minor accidents are reported. Independent driving assessment is available to determine safety of driving.   If you are interested in the driving assessment, you can contact the following:  [facility] [employer] in [place] [phone]  [facility] [phone]  [facility] [phone]  [facility] [phone] or [phone]   FALL PRECAUTIONS: Be cautious when walking. Scan the area for obstacles that may increase the risk of trips and falls. When getting up in the mornings, sit up at the edge of the bed for a few minutes before getting out of bed. Consider elevating the bed at the head end to avoid drop of blood pressure when getting up. Walk always in a well-lit room (use night lights in the walls). Avoid area rugs or power cords from appliances in the middle of the walkways. Use a walker or a cane if necessary and consider physical therapy for balance exercise. Get your eyesight checked regularly.  FINANCIAL OVERSIGHT: Supervision, especially oversight when making financial decisions or transactions is also recommended.  HOME SAFETY: Consider the safety of the kitchen when operating appliances like stoves, microwave oven, and blender. Consider having supervision and share cooking responsibilities until no longer able to participate in those. Accidents with firearms and other hazards in the house should be identified and addressed as well.   ABILITY TO BE LEFT ALONE: If patient is unable  to contact 911 operator, consider using LifeLine, or when the need is there, arrange for someone to stay with patients. Smoking is a fire hazard, consider supervision or cessation. Risk of wandering should be assessed by caregiver and if detected at any point, supervision and safe proof recommendations should be instituted.  MEDICATION SUPERVISION: Inability to self-administer medication needs to be constantly addressed. Implement a mechanism to ensure safe administration of the medications.      Mediterranean Diet A Mediterranean diet refers to food and lifestyle choices that are based on the traditions of countries located on the [employer]. This way of eating has been shown to help prevent certain conditions and improve outcomes for people who have chronic diseases, like kidney disease and heart disease. What are tips for following this plan? Lifestyle  Cook and eat meals together with your family, when possible. Drink enough fluid to keep your urine clear or pale yellow. Be physically active every day. This includes: Aerobic exercise like running or swimming. Leisure activities like gardening, walking, or housework. Get 7-8 hours of sleep each night. If recommended by your health care provider, drink red wine in moderation. This means 1 glass a day for nonpregnant women and 2 glasses a day for men. A glass of wine equals 5 oz (150 mL). Reading food labels  Check the serving size of packaged foods. For foods such as rice and pasta, the serving size refers to the amount of cooked product,  not dry. Check the total fat in packaged foods. Avoid foods that have saturated fat or trans fats. Check the ingredients list for added sugars, such as corn syrup. Shopping  At the grocery store, buy most of your food from the areas near the walls of the store. This includes: Fresh fruits and vegetables (produce). Grains, beans, nuts, and seeds. Some of these may be available in unpackaged forms  or large amounts (in bulk). Fresh seafood. Poultry and eggs. Low-fat dairy products. Buy whole ingredients instead of prepackaged foods. Buy fresh fruits and vegetables in-season from local farmers markets. Buy frozen fruits and vegetables in resealable bags. If you do not have access to quality fresh seafood, buy precooked frozen shrimp or canned fish, such as tuna, salmon, or sardines. Buy small amounts of raw or cooked vegetables, salads, or olives from the deli or salad bar at your store. Stock your pantry so you always have certain foods on hand, such as olive oil, canned tuna, canned tomatoes, rice, pasta, and beans. Cooking  Cook foods with extra-virgin olive oil instead of using butter or other vegetable oils. Have meat as a side dish, and have vegetables or grains as your main dish. This means having meat in small portions or adding small amounts of meat to foods like pasta or stew. Use beans or vegetables instead of meat in common dishes like chili or lasagna. Experiment with different cooking methods. Try roasting or broiling vegetables instead of steaming or sauteing them. Add frozen vegetables to soups, stews, pasta, or rice. Add nuts or seeds for added healthy fat at each meal. You can add these to yogurt, salads, or vegetable dishes. Marinate fish or vegetables using olive oil, lemon juice, garlic, and fresh herbs. Meal planning  Plan to eat 1 vegetarian meal one day each week. Try to work up to 2 vegetarian meals, if possible. Eat seafood 2 or more times a week. Have healthy snacks readily available, such as: Vegetable sticks with hummus. Greek yogurt. Fruit and nut trail mix. Eat balanced meals throughout the week. This includes: Fruit: 2-3 servings a day Vegetables: 4-5 servings a day Low-fat dairy: 2 servings a day Fish, poultry, or lean meat: 1 serving a day Beans and legumes: 2 or more servings a week Nuts and seeds: 1-2 servings a day Whole grains: 6-8 servings  a day Extra-virgin olive oil: 3-4 servings a day Limit red meat and sweets to only a few servings a month What are my food choices? Mediterranean diet Recommended Grains: Whole-grain pasta. Brown rice. Bulgar wheat. Polenta. Couscous. Whole-wheat bread. Orpah Cobb. Vegetables: Artichokes. Beets. Broccoli. Cabbage. Carrots. Eggplant. Green beans. Chard. Kale. Spinach. Onions. Leeks. Peas. Squash. Tomatoes. Peppers. Radishes. Fruits: Apples. Apricots. Avocado. Berries. Bananas. Cherries. Dates. Figs. Grapes. Lemons. Melon. Oranges. Peaches. Plums. Pomegranate. Meats and other protein foods: Beans. Almonds. Sunflower seeds. Pine nuts. Peanuts. Cod. Salmon. Scallops. Shrimp. Tuna. Tilapia. Clams. Oysters. Eggs. Dairy: Low-fat milk. Cheese. Greek yogurt. Beverages: Water. Red wine. Herbal tea. Fats and oils: Extra virgin olive oil. Avocado oil. Grape seed oil. Sweets and desserts: [place] yogurt with honey. Baked apples. Poached pears. Trail mix. Seasoning and other foods: Basil. Cilantro. Coriander. Cumin. Mint. Parsley. Sage. Rosemary. Tarragon. Garlic. Oregano. Thyme. Pepper. Balsalmic vinegar. Tahini. Hummus. Tomato sauce. Olives. Mushrooms. Limit these Grains: Prepackaged pasta or rice dishes. Prepackaged cereal with added sugar. Vegetables: Deep fried potatoes (french fries). Fruits: Fruit canned in syrup. Meats and other protein foods: Beef. Pork. Lamb. Poultry with skin. Hot dogs. Tomasa Blase. Dairy:  Ice cream. Sour cream. Whole milk. Beverages: Juice. Sugar-sweetened soft drinks. Beer. Liquor and spirits. Fats and oils: Butter. Canola oil. Vegetable oil. Beef fat (tallow). Lard. Sweets and desserts: Cookies. Cakes. Pies. Candy. Seasoning and other foods: Mayonnaise. Premade sauces and marinades. The items listed may not be a complete list. Talk with your dietitian about what dietary choices are right for you. Summary The Mediterranean diet includes both food and lifestyle choices. Eat a  variety of fresh fruits and vegetables, beans, nuts, seeds, and whole grains. Limit the amount of red meat and sweets that you eat. Talk with your health care provider about whether it is safe for you to drink red wine in moderation. This means 1 glass a day for nonpregnant women and 2 glasses a day for men. A glass of wine equals 5 oz (150 mL). This information is not intended to replace advice given to you by your health care provider. Make sure you discuss any questions you have with your health care provider. Document Released: 02/24/2016 Document Revised: 03/28/2016 Document Reviewed: 02/24/2016 Elsevier Interactive Patient Education  2017 [employer].

## 2023-09-27 NOTE — Progress Notes (Cosign Needed Addendum)
 Assessment/Plan:     [name] is a very pleasant 73 y.o. year old LH male with a history of hypertension, hyperlipidemia, chronic disease status post left hemicolectomy,chronic prostatitis, history of SVT with PMP, asthma, psoriasis, untreated ADHD, seen today for evaluation of memory loss. MoCA today is 22/30, although, impulsiveness was noted during the  testing, which may have contributed to a lower score.  Workup is in progress.  He remains independent of his ADLs.    Memory Impairment  MRI brain without contrast to assess for underlying structural abnormality and assess vascular load  Neurocognitive testing to further evaluate cognitive concerns and determine other underlying cause of memory changes, including potential contribution from sleep, anxiety, attention, or depression among others  Check B12, TSH, B1 No indication for antidementia medication at this time Recommend good control of cardiovascular risk factors.   Continue to control mood as per PCP Monitor pain medications Folllow up pending on the results of the neuropsych evaluation.  Subjective:    The patient is here alone     How long did patient have memory difficulties?  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  Repeats oneself? Occasionally Disoriented when walking into a room?  Patient denies    Leaving objects in unusual places?  Loses the keys and other objects.    Wandering behavior? denies   Any personality changes, or depression, anxiety? Denies  Hallucinations or paranoia? Denies.   Seizures? denies    Any sleep changes?  He sleeps well with trazodone, melatonin and hydrocodone at night .  Denies vivid dreams, REM behavior or sleepwalking   Sleep apnea? Denies.   Any hygiene concerns?  Denies.   Independent of bathing and dressing? Endorsed  Does the patient  need help with medications?  Patient is in charge   Who is in charge of the finances? Patient is in charge     Any changes in appetite?  Decreased appetite, sees GI, taking prednisone to improve appetite    Patient have trouble swallowing?  Has a history of esophageal issues  after intubation in the past, now with some dysphagia followed by GI Does the patient cook?  Yes, denies any kitchen accidents Any headaches?  Denies.  Occasional sinus headaches. Chronic pain?taking hydrocodone managed by PCP Ambulates with difficulty? Denies, walks his dog 3 times a day. Goes kayaking in [place].  Recent falls or head injuries?  He fell on ice 20 y ago, with LOC, no other falls. Vision changes? He has cataracts Any strokelike symptoms? Denies.   Any tremors? Medication induced tremors in hands, occasional -intermittent. Any anosmia? Denies.   Any incontinence of urine? Endorsed, uses pads due to prostate issues Any bowel dysfunction?  Has a history of Chron's  disease, status post ileostomy and colostomy.  Currently having a flare. He does a flare diet, and takes prednisone and other GI meds.  Patient lives with wife  History of heavy alcohol intake? Denies.   History of heavy tobacco use? Denies.   Family history of dementia?  Denies.  Does patient drive?Yes, denies any issues. 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    Humira [Adalimumab] Other (See Comments)    Developed antibodies   Tape Rash   Wound Dressing Adhesive Rash    Current Outpatient Medications  Medication Instructions   acetaminophen (TYLENOL) 650 mg, Every 6 hours PRN   albuterol (VENTOLIN HFA) 108 (90 Base) MCG/ACT inhaler 2 puffs, Inhalation, Every  6 hours PRN   AMBULATORY NON FORMULARY MEDICATION Medication Name: nifedipine 0.2%/lidocaine 5% ointment 1:1 mix Apply 3-4 tmes/day into anal canal   ascorbic acid (VITAMIN C) 250 MG CHEW Chew by mouth.   busPIRone (BUSPAR) 10 mg, Every morning   calcium carbonate (TUMS - DOSED IN MG ELEMENTAL CALCIUM) 500 mg, 3 times daily PRN   cholestyramine (QUESTRAN) 4 g, Daily with lunch   diphenoxylate-atropine (LOMOTIL) 2.5-0.025 MG tablet 2 tablets, Oral, 4 times daily   ferrous sulfate 325 mg, Oral, Daily with breakfast   finasteride (PROSCAR) 5 mg, Oral, Daily   gabapentin (NEURONTIN) 100 mg, Oral, Daily at bedtime   HYDROcodone-acetaminophen (NORCO) 5-325 MG tablet 1 tablet, Oral, Every 8 hours PRN   HYDROcodone-acetaminophen (NORCO) 5-325 MG tablet 1 tablet, Oral, Every 8 hours PRN   HYDROcodone-acetaminophen (NORCO) 5-325 MG tablet 1 tablet, Oral, Every 8 hours PRN   hyoscyamine (LEVSIN SL) 0.125 mg, Every 6 hours PRN   magnesium oxide (MAG-OX) 400 mg, Oral, 2 times daily   melatonin 3 mg, Daily at bedtime   Multiple Vitamin (MULTIVITAMIN WITH MINERALS) TABS tablet 1 tablet, Daily   Nystatin (GERHARDT'S BUTT CREAM) CREA Disp # 1 container Apply to affected area bid   Nystatin (GERHARDT'S BUTT CREAM) CREA Apply topically to affected area 2 (two) times daily.   ondansetron (ZOFRAN-ODT) 4 MG disintegrating tablet Dissolve 1 tablet (4 mg total) by mouth every 6 (six) hours as needed for nausea or vomiting.   oxybutynin (DITROPAN-XL) 5 mg, Daily   pantoprazole (PROTONIX) 40 MG tablet TAKE 1 TABLET(40 MG) BY MOUTH TWICE DAILY BEFORE BREAKFAST AND SUPPER   phenazopyridine (PYRIDIUM) 200 MG tablet TAKE 1 TABLET(200 MG) BY MOUTH THREE TIMES DAILY AS NEEDED FOR URINARY PAIN   Potassium Chloride ER 20 MEQ TBCR TAKE 1 TABLET(20 MEQ) BY MOUTH TWICE DAILY   predniSONE (DELTASONE) 10 MG tablet Take 4 tablets (40 mg total) by mouth daily for 10 days, THEN 3 tablets (30 mg total) daily for 10 days, THEN 2  tablets (20 mg total) daily for 10 days, THEN 1 tablet (10 mg total) daily for 10 days.   rifaximin (XIFAXAN) 550 mg, Oral, 3 times daily   Rinvoq 30 mg, Oral, Daily   simethicone (MYLICON) 80 mg, 3 times daily with meals & bedtime   thiamine (VITAMIN B1) 100 mg, Oral, Daily   traZODone (DESYREL) 100 mg, Oral, Daily at bedtime   valACYclovir (VALTREX) 1000 MG tablet Take one tablet 3 times a day for 7 days as needed for shingles flares   Vitamin D 2,000 Units, Oral, Daily     VITALS:   Vitals:   09/27/23 1331  BP: 134/74  Pulse: 79  Resp: 18  SpO2: 98%  Weight: 162 lb (73.5 kg)      PHYSICAL EXAM   HEENT:  Normocephalic, atraumatic.  The superficial temporal arteries are without ropiness or tenderness. Cardiovascular: Regular rate and rhythm. Lungs: Clear to auscultation bilaterally. Neck: There are no carotid bruits noted bilaterally.  NEUROLOGICAL:    09/27/2023    3:00 PM  Montreal Cognitive Assessment   Visuospatial/  Executive (0/5) 3  Naming (0/3) 3  Attention: Read list of digits (0/2) 1  Attention: Read list of letters (0/1) 1  Attention: Serial 7 subtraction starting at 100 (0/3) 3  Language: Repeat phrase (0/2) 1  Language : Fluency (0/1) 1  Abstraction (0/2) 1  Delayed Recall (0/5) 4  Orientation (0/6) 4  Total 22  Adjusted Score (based on education) 22        No data to display           Orientation:  Alert and oriented to person, place and not to time. No aphasia or dysarthria. Fund of knowledge is appropriate. Recent memory impaired, remote memory normal..  Attention and concentration are reduced.  Able to name objects and repeat phrases Delayed recall 4/5 Cranial nerves: There is good facial symmetry. Extraocular muscles are intact and visual fields are full to confrontational testing. Speech is fluent and clear. No tongue deviation. Hearing is intact to conversational tone.  Tone: Tone is good throughout. Sensation: Sensation is intact to light  touch.  Vibration is intact at the bilateral big toe.  Coordination: The patient has no difficulty with RAM's or FNF bilaterally. Normal finger to nose  Motor: Strength is 5/5 in the bilateral upper and lower extremities. There is no pronator drift. There are no fasciculations noted. DTR's: Deep tendon reflexes are 2/4 bilaterally. Gait and Station: The patient is able to ambulate without difficulty. Gait is cautious and narrow. Stride length is normal       Thank you for allowing [place] the opportunity to participate in the care of this nice patient. Please do not hesitate to contact us for any questions or concerns.   Total time spent on today's visit was 60 minutes dedicated to this patient today, preparing to see patient, examining the patient, ordering tests and/or medications and counseling the patient, documenting clinical information in the EHR or other health record, independently interpreting results and communicating results to the patient/family, discussing treatment and goals, answering patient's questions and coordinating care.  Cc:  [name], MD  Marlowe Kays 09/27/2023 3:03 PM

## 2023-09-29 ENCOUNTER — Other Ambulatory Visit: Payer: Self-pay | Admitting: Internal Medicine

## 2023-10-01 ENCOUNTER — Other Ambulatory Visit (HOSPITAL_COMMUNITY): Payer: Self-pay

## 2023-10-01 MED ORDER — HYDROCODONE-ACETAMINOPHEN 5-325 MG PO TABS: 1.0000 | ORAL_TABLET | Freq: Three times a day (TID) | ORAL | 0 refills | Status: DC | PRN

## 2023-10-01 NOTE — Telephone Encounter (Signed)
 He is due for another pain management visit. However since I will be out of the office off and on for the next few weeks, I went ahead and sent in another #90 of these. Have him set up a PMV for when I get back please.

## 2023-10-01 NOTE — Addendum Note (Signed)
 Addended by: Gershon Crane A on: 10/01/2023 12:05 PM   Modules accepted: Orders

## 2023-10-01 NOTE — Telephone Encounter (Signed)
 Please refill Sir if approved, thank you.

## 2023-10-03 ENCOUNTER — Ambulatory Visit: Payer: Self-pay | Admitting: Family Medicine

## 2023-10-03 ENCOUNTER — Telehealth: Payer: Self-pay

## 2023-10-03 NOTE — Telephone Encounter (Signed)
 Noted.

## 2023-10-03 NOTE — Telephone Encounter (Signed)
 Copied from CRM [id number]. Topic: Clinical - Request for Lab/Test Order >> Oct 03, 2023  1:01 PM Pascal Lux wrote: Reason for CRM: Patient stated that he saw Marcos Eke, PA-C at  [facility] - she requested he get labs done with his primary care provider.

## 2023-10-03 NOTE — Telephone Encounter (Signed)
  Chief Complaint: rash Symptoms: redness, open skin Frequency: ongoing Pertinent Negatives: Patient denies fever, drainage Disposition: [] ED /[] Urgent Care (no appt availability in office) / [] Appointment(In office/virtual)/ []  [facility]/ [] Home Care/ [] Refused Recommended Disposition /[] [facility]/ []  Follow-up with PCP Additional Notes:  Has "adult diaper rash" rash on groin.  History of Crone's disease, uses bathroom frequently. Using butt paste as advised by GI doctor last, GI doctor advised if rash not improved after one week to follow up with PCP. Groin remains irritated with redness and open areas, no drainage, afebrile. Acute visit scheduled with another provider due to no acute PCP appointments. Reports he also needs labs drawn for neurology. Educated on care advice as documented in protocol, patient verbalized understanding.     Copied from CRM [id number]. Topic: Clinical - Red Word Triage >> Oct 03, 2023  1:05 PM Pascal Lux wrote: Red Word that prompted transfer to Nurse Triage: Patient stated he has adult diaper rash and it's painful. He has Crohn's disease and goes to the restroom a lot. Reason for Disposition  [1] Looks infected (spreading redness, pus) AND [2] no fever  Protocols used: Rash or Redness - Localized-A-AH

## 2023-10-04 ENCOUNTER — Ambulatory Visit (INDEPENDENT_AMBULATORY_CARE_PROVIDER_SITE_OTHER): Admitting: Adult Health

## 2023-10-04 VITALS — BP 110/60 | HR 76 | Temp 97.8°F | Ht 70.0 in | Wt 165.0 lb

## 2023-10-04 DIAGNOSIS — B49 Unspecified mycosis: Secondary | ICD-10-CM | POA: Diagnosis not present

## 2023-10-04 MED ORDER — CLOTRIMAZOLE-BETAMETHASONE 1-0.05 % EX CREA: 1.0000 | TOPICAL_CREAM | Freq: Every day | CUTANEOUS | 0 refills | Status: DC

## 2023-10-04 NOTE — Progress Notes (Signed)
 Subjective:    Patient ID: [name], male    DOB: 20-Feb-1951, 73 y.o.   MRN: [id number]  Rash   73 year old male who  has a past medical history of Acute prostatitis (07/24/2007), Acute respiratory failure with hypoxia (HCC), Allergy, Arthritis, Aspiration pneumonia (HCC) (05/24/2022), Asthma, Blood transfusion without reported diagnosis, BPH (benign prostatic hypertrophy) with urinary obstruction, Crohn's ileitis (HCC) suspected (05/03/2017), Dilated aortic root (HCC), Diverticulitis of colon, Drug-induced acute pancreatitis without infection or necrosis - from 6 MP (09/24/2017), EPIDIDYMITIS (02/15/2010), GERD (gastroesophageal reflux disease), H/O: GI bleed, Hemorrhoids, Hepatitis (1975), HERPES SIMPLEX INFECTION (10/14/2007), Hiatal hernia, Ileus following gastrointestinal surgery (HCC) (12/26/2011), Long term (current) use of systemic steroids (06/18/2018), Psoriasis, Rectal fissure, Recurrent ventral incisional hernia (05/10/2012), Shingles, Short bowel syndrome, SVT (supraventricular tachycardia) (HCC), and Ulcer (08/21/2013).  He is a patient of Dr. Clent Ridges who I am seeing today for the complaint of rash. He was seen by his GI doctor, Dr. Leone Payor about 10 days ago and was prescribed Nystain cream for for new perianal dermatitis. He has been using this as directed but has not noticed any improvement and feels as though it is getting worse. He has noticed some blood from the rash.   Review of Systems  Skin:  Positive for rash.   See HPI   Past Medical History:  Diagnosis Date   Acute prostatitis 07/24/2007   Qualifier: Diagnosis of  By: Clent Ridges MD, Jeannett Senior A    Acute respiratory failure with hypoxia [facility])    Allergy    mild   Arthritis    osteoarthritis   Aspiration pneumonia (HCC) 05/24/2022   Asthma    Blood transfusion without reported diagnosis    BPH (benign prostatic hypertrophy) with urinary obstruction    Crohn's ileitis (HCC) suspected 05/03/2017   Dilated aortic root  (HCC)    noted on echo 08/2012   Diverticulitis of colon    Drug-induced acute pancreatitis without infection or necrosis - from 6 MP 09/24/2017   EPIDIDYMITIS 02/15/2010   Qualifier: Diagnosis of  By: Clent Ridges MD, Tera Mater    GERD (gastroesophageal reflux disease)    H/O: GI bleed    Hemorrhoids    Hepatitis 1975   unknown type    HERPES SIMPLEX INFECTION 10/14/2007   Qualifier: Diagnosis of  By: Clent Ridges MD, Jeannett Senior A    Hiatal hernia    Ileus following gastrointestinal surgery (HCC) 12/26/2011   Long term (current) use of systemic steroids 06/18/2018   Psoriasis    sees Dr. Karene Fry at [facility].   Rectal fissure    Recurrent ventral incisional hernia 05/10/2012   Shingles    Short bowel syndrome    SVT (supraventricular tachycardia) (HCC)    Ulcer 08/21/2013   ileal    Social History   Socioeconomic History   Marital status: Married    Spouse name: Not on file   Number of children: 2   Years of education: Not on file   Highest education level: Not on file  Occupational History   Occupation: EHS manager-retired    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  Tobacco Use   Smoking status: Never   Smokeless tobacco: Never  Vaping Use   Vaping status: Never Used  Substance and Sexual Activity   Alcohol use: Not Currently    Comment: occ   Drug use: Yes    Types: Oxycodone   Sexual activity: Not on file  Other Topics Concern   Not on file  Social History  Narrative   He is married with 2 sons 1 son is an Acupuncturist and the 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 and 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   He is Horticulturist, commercial at the Teacher, 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   Rare if any caffeine   Rare alcohol and never smoker   Social Drivers of Corporate investment banker Strain: Low Risk  (08/31/2022)   Received from [facility], [facility]   Overall Financial Resource Strain (CARDIA)    Difficulty of Paying Living  Expenses: Not hard at all  Food Insecurity: No Food Insecurity (08/31/2022)   Received from [facility], [facility]   Hunger Vital Sign    Worried About Running Out of Food in the Last Year: Never true    Ran Out of Food in the Last Year: Never true  Transportation Needs: No Transportation Needs (08/31/2022)   Received from [facility], [facility]   PRAPARE - Transportation    Lack of Transportation (Medical): No    Lack of Transportation (Non-Medical): No  Physical Activity: Insufficiently Active (08/31/2022)   Received from [facility], [facility]   Exercise Vital Sign    Days of Exercise per Week: 2 days    Minutes of Exercise per Session: 20 min  Stress: Stress Concern Present (08/31/2022)   Received from [facility], [facility] - Occupational Stress Questionnaire    Feeling of Stress : Rather much  Social Connections: Unknown (08/31/2022)   Received from [facility], [facility]   Social Connection and Isolation Panel [NHANES]    Frequency of Communication with Friends and Family: More than three times a week    Frequency of Social Gatherings with Friends and Family: More than three times a week    Attends Religious Services: Never    Database administrator or Organizations: No    Attends Banker Meetings: Never    Marital Status: Not on file  Intimate Partner Violence: Not At Risk (08/31/2022)   Received from [facility], [facility]   Humiliation, Afraid, Rape, and Kick questionnaire    Fear of Current or Ex-Partner: No    Emotionally Abused: No    Physically Abused: No    Sexually Abused: No    Past Surgical History:  Procedure Laterality Date   BOWEL RESECTION  12/19/2011   Procedure: SMALL BOWEL RESECTION;  Surgeon: Mariella Saa, MD;  Location: [facility];  Service: General;  Laterality: N/A;  with anastamosis and insertion mesh   BRONCHOSCOPY     COLON SURGERY   01/2004   x 2   COLONOSCOPY W/ BIOPSIES  04/26/2017   per Dr. Leone Payor, no polyps, benign inflammation, repeat in 5 yrs    CYSTOSCOPY     ESOPHAGOGASTRODUODENOSCOPY     HEMICOLECTOMY     left side, at [facility], diverticulitis   HEMORRHOID BANDING     HERNIA REPAIR     [phone] incisional hernia   ILEOSTOMY     ILEOSTOMY CLOSURE     INSERTION OF MESH  07/31/2012   Procedure: INSERTION OF MESH;  Surgeon: Mariella Saa, MD;  Location: [facility];  Service: General;;   LAPAROTOMY  12/19/2011   Procedure: EXPLORATORY LAPAROTOMY;  Surgeon: Mariella Saa, MD;  Location: [facility];  Service: General;  Laterality:  N/A;   PACEMAKER IMPLANT N/A 12/01/2020   Procedure: PACEMAKER IMPLANT;  Surgeon: Regan Lemming, MD;  Location: [facility]VAS[facility];  Service: Cardiovascular;  Laterality: N/A;   PACEMAKER INSERTION Left    TONSILLECTOMY     UPPER GASTROINTESTINAL ENDOSCOPY     VASECTOMY     VENTRAL HERNIA REPAIR  07/31/2012   Procedure: HERNIA REPAIR VENTRAL ADULT;  Surgeon: Mariella Saa, MD;  Location: [facility];  Service: General;  Laterality: N/A;    Family History  Problem Relation Age of Onset   Lung cancer Mother    Leukemia Father    Hypertension Father    Heart disease Father    Heart attack Father    Prostate cancer Father    Prostate cancer Paternal Uncle    Colon cancer Neg Hx    Stomach cancer Neg Hx    Colon polyps Neg Hx    Esophageal cancer Neg Hx    Rectal cancer Neg Hx     Allergies  Allergen Reactions   Purinethol [Mercaptopurine] Other (See Comments)    Pancreatitis    Shellfish Allergy Anaphylaxis and Swelling    Can use standard SMOF lipid formulation for TPN without any issue.    Humira [Adalimumab] Other (See Comments)    Developed antibodies   Tape Rash   Wound Dressing Adhesive Rash    Current Outpatient Medications on File Prior to Visit  Medication Sig Dispense Refill   acetaminophen (TYLENOL) 325 MG tablet Take 650 mg by mouth every 6  (six) hours as needed for moderate pain.     albuterol (VENTOLIN HFA) 108 (90 Base) MCG/ACT inhaler Inhale 2 puffs into the lungs every 6 (six) hours as needed for wheezing or shortness of breath. 6.7 g 2   AMBULATORY NON FORMULARY MEDICATION Medication Name: nifedipine 0.2%/lidocaine 5% ointment 1:1 mix Apply 3-4 tmes/day into anal canal 30 g 3   ascorbic acid (VITAMIN C) 250 MG CHEW Chew by mouth.     busPIRone (BUSPAR) 10 MG tablet Take 10 mg by mouth every morning.     calcium carbonate (TUMS - DOSED IN MG ELEMENTAL CALCIUM) 500 MG chewable tablet Chew 500 mg by mouth 3 (three) times daily as needed for indigestion or heartburn.     Cholecalciferol (VITAMIN D) 50 MCG (2000 UT) tablet Take 1 tablet (2,000 Units total) by mouth daily.     cholestyramine (QUESTRAN) 4 g packet Take 1 packet (4 g total) by mouth daily with lunch.     diphenoxylate-atropine (LOMOTIL) 2.5-0.025 MG tablet TAKE 2 TABLETS BY MOUTH FOUR TIMES DAILY AS NEEDED FOR DIARRHEA OR LOOSE STOOLS 240 tablet 5   ferrous sulfate 325 (65 FE) MG tablet Take 1 tablet (325 mg total) by mouth daily with breakfast.  3   finasteride (PROSCAR) 5 MG tablet Take 1 tablet (5 mg total) by mouth daily. 90 tablet 3   gabapentin (NEURONTIN) 100 MG capsule Take 1 capsule (100 mg total) by mouth at bedtime.     HYDROcodone-acetaminophen (NORCO/VICODIN) 5-325 MG tablet Take 1 tablet by mouth every 8 (eight) hours as needed for moderate pain (pain score 4-6). 90 tablet 0   hyoscyamine (LEVSIN SL) 0.125 MG SL tablet Place 0.125 mg under the tongue every 6 (six) hours as needed for cramping.     magnesium oxide (MAG-OX) 400 MG tablet Take 1 tablet (400 mg total) by mouth 2 (two) times daily. (Patient not taking: Reported on 09/27/2023)     melatonin 3 MG  TABS tablet Take 3 mg by mouth at bedtime.     Multiple Vitamin (MULTIVITAMIN WITH MINERALS) TABS tablet Take 1 tablet by mouth daily.     Nystatin (GERHARDT'S BUTT CREAM) CREA Disp # 1 container Apply to  affected area bid 1 each 5   ondansetron (ZOFRAN-ODT) 4 MG disintegrating tablet Dissolve 1 tablet (4 mg total) by mouth every 6 (six) hours as needed for nausea or vomiting. 20 tablet 0   oxybutynin (DITROPAN-XL) 5 MG 24 hr tablet Take 5 mg by mouth daily.     pantoprazole (PROTONIX) 40 MG tablet TAKE 1 TABLET(40 MG) BY MOUTH TWICE DAILY BEFORE BREAKFAST AND SUPPER 180 tablet 0   phenazopyridine (PYRIDIUM) 200 MG tablet TAKE 1 TABLET(200 MG) BY MOUTH THREE TIMES DAILY AS NEEDED FOR URINARY PAIN 60 tablet 5   Potassium Chloride ER 20 MEQ TBCR TAKE 1 TABLET(20 MEQ) BY MOUTH TWICE DAILY 60 tablet 3   predniSONE (DELTASONE) 10 MG tablet Take 4 tablets (40 mg total) by mouth daily for 10 days, THEN 3 tablets (30 mg total) daily for 10 days, THEN 2 tablets (20 mg total) daily for 10 days, THEN 1 tablet (10 mg total) daily for 10 days. 100 tablet 0   rifaximin (XIFAXAN) 550 MG TABS tablet Take 1 tablet (550 mg total) by mouth 3 (three) times daily. 42 tablet 0   simethicone (MYLICON) 80 MG chewable tablet Chew 80 mg by mouth 4 (four) times daily -  with meals and at bedtime.     thiamine (VITAMIN B1) 100 MG tablet Take 1 tablet (100 mg total) by mouth daily. 90 tablet 0   traZODone (DESYREL) 100 MG tablet Take 1 tablet (100 mg total) by mouth at bedtime.     Upadacitinib ER (RINVOQ) 30 MG TB24 Take 1 tablet (30 mg total) by mouth daily. 90 tablet 2   valACYclovir (VALTREX) 1000 MG tablet Take one tablet 3 times a day for 7 days as needed for shingles flares 100 tablet 2   No current facility-administered medications on file prior to visit.    BP 110/60   Pulse 76   Temp 97.8 F (36.6 C) (Oral)   Ht 5\' 10"  (1.778 m)   Wt 165 lb (74.8 kg)   SpO2 94%   BMI 23.68 kg/m       Objective:   Physical Exam Constitutional:      Appearance: Normal appearance.  Skin:    Findings: Erythema and rash present.     Comments: He has a red rash with raised boarders on both sides of his gluteal cleft and  extends down towards his rectum. No active bleeding or skin break down noted   Neurological:     General: No focal deficit present.     Mental Status: He is alert and oriented to person, place, and time.  Psychiatric:        Mood and Affect: Mood normal.        Behavior: Behavior normal.        Thought Content: Thought content normal.        Judgment: Judgment normal.        Assessment & Plan:  1. Fungal infection (Primary) - He can stop Nystatin and will place him on Lotrisone cream daily. Use for no longer than two weeks. Also use anti fungal powder. If not improving then follow up in clinic in 2 weeks. Can consider oral Lamisil  - clotrimazole-betamethasone (LOTRISONE) cream; Apply 1 Application topically daily.  Dispense: 30 g; Refill: 0  Shirline Frees, NP

## 2023-10-05 ENCOUNTER — Other Ambulatory Visit (HOSPITAL_COMMUNITY): Payer: Self-pay

## 2023-10-08 ENCOUNTER — Encounter: Payer: Self-pay | Admitting: Internal Medicine

## 2023-10-08 NOTE — Telephone Encounter (Signed)
 She already put in these orders on 09-27-23

## 2023-10-10 ENCOUNTER — Ambulatory Visit: Payer: Self-pay | Admitting: Physical Therapy

## 2023-10-14 IMAGING — CT CT ABD-PELV W/ CM
3 of 13 series · 11 of 46 positions shown, 17 images · IV contrast (OMNIPAQUE 300)
Comparison: 09/20/2021

CLINICAL DATA: Abdominal pain following history of recent bowel
surgery on 11/11/2021

EXAM:
CT ABDOMEN AND PELVIS WITH CONTRAST
TECHNIQUE: Multidetector CT imaging of the abdomen and pelvis was performed
using the standard protocol following bolus administration of
intravenous contrast.

[Series 4: coronal st · coronal · 0.87mm/px · 2 of 153 slices shown, 3 images]
[im 51/153  soft-tissue]
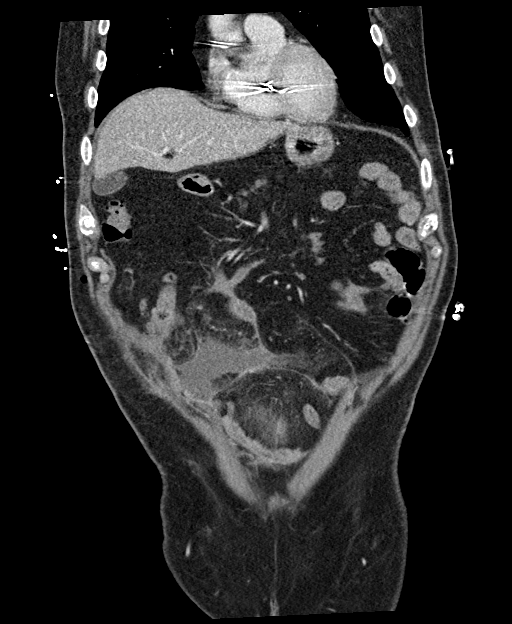
[im 51/153  bone]
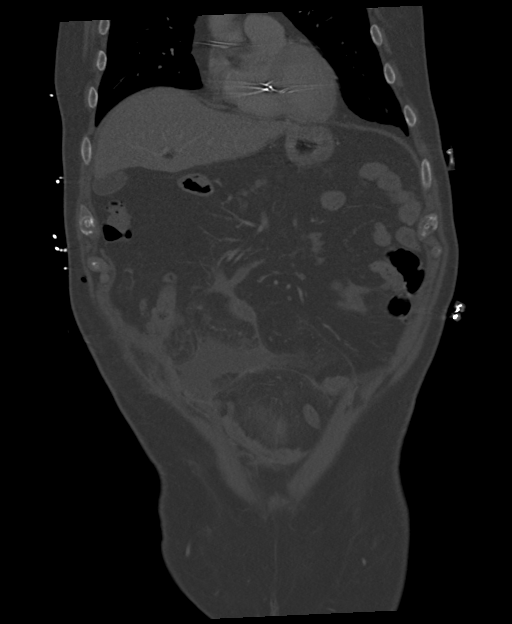
[im 102/153  soft-tissue]
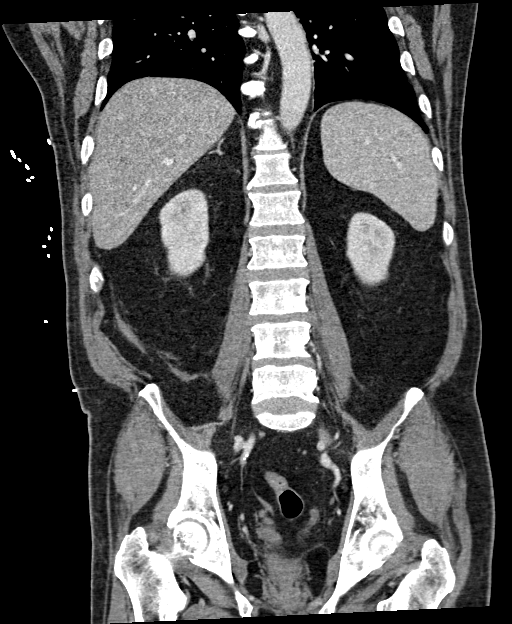

[Series 17: delay · axial · delayed · 0.98mm/px · z∈[-137,-67]mm · 2 of 42 slices shown]
[im 14/42  soft-tissue]
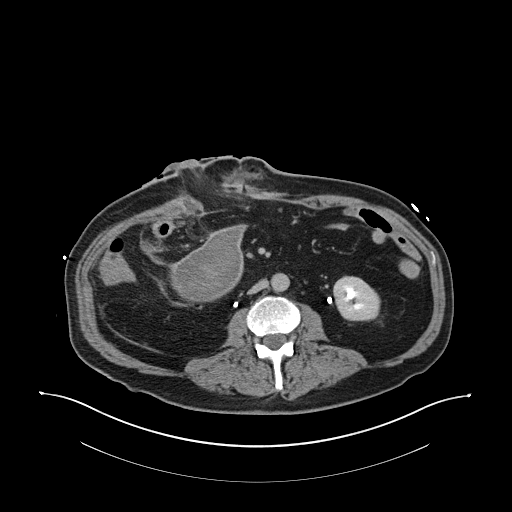
[im 28/42  soft-tissue]
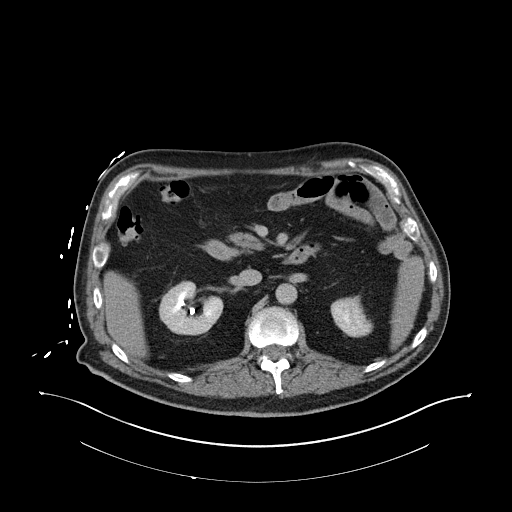

[Series 18: axial st · axial · 0.95mm/px · z∈[-339,+51]mm · 7 of 104 slices shown, 12 images]
[im 13/104  soft-tissue]
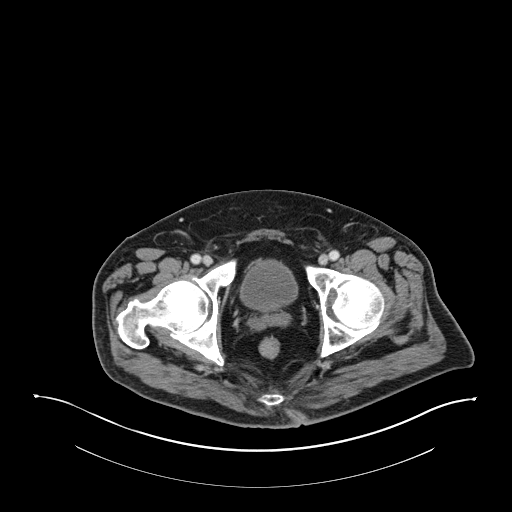
[im 13/104  bone]
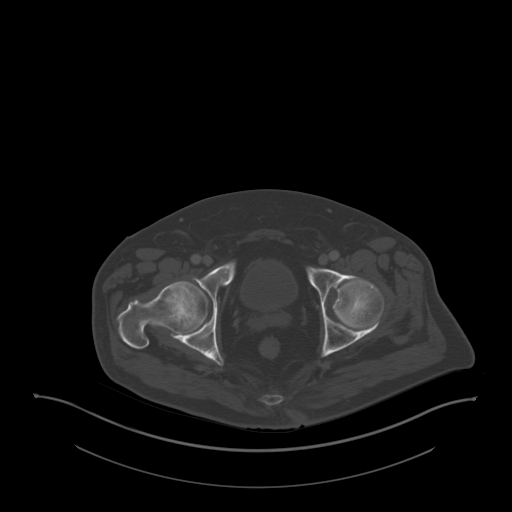
[im 26/104  soft-tissue]
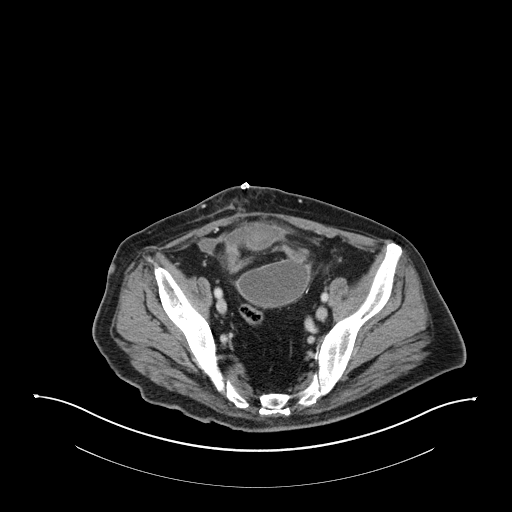
[im 39/104  soft-tissue]
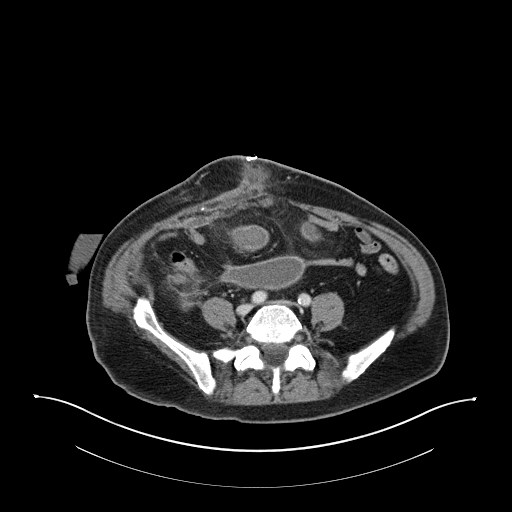
[im 52/104  soft-tissue]
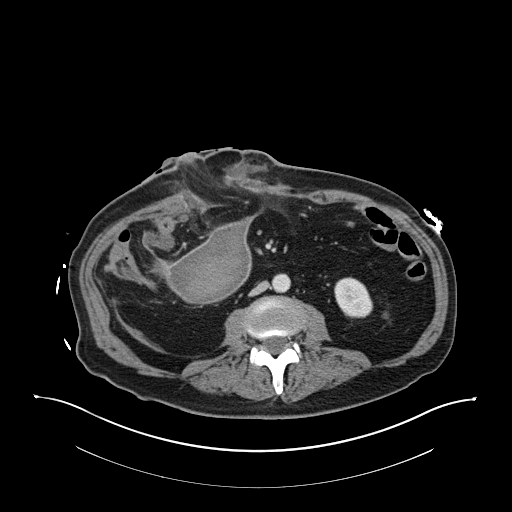
[im 52/104  lung]
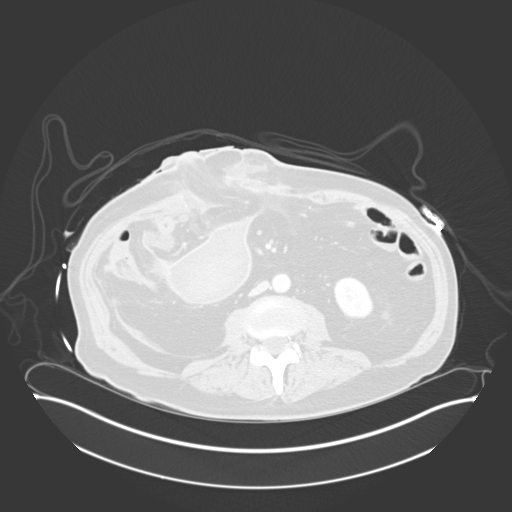
[im 65/104  soft-tissue]
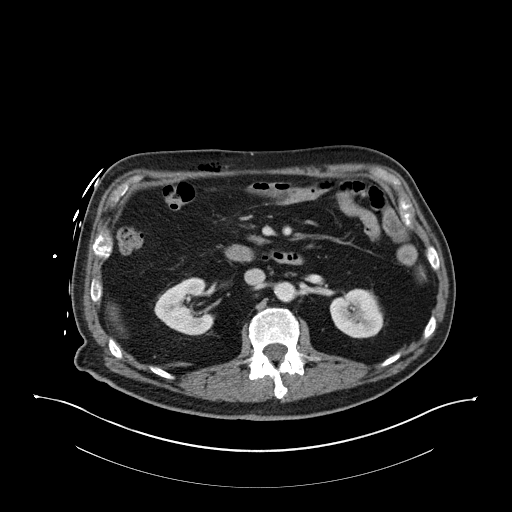
[im 65/104  lung]
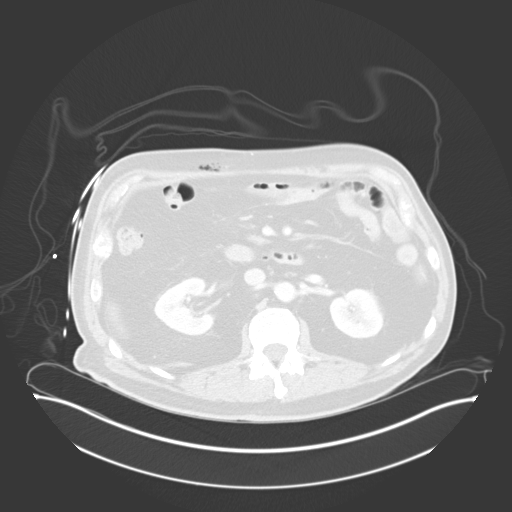
[im 78/104  soft-tissue]
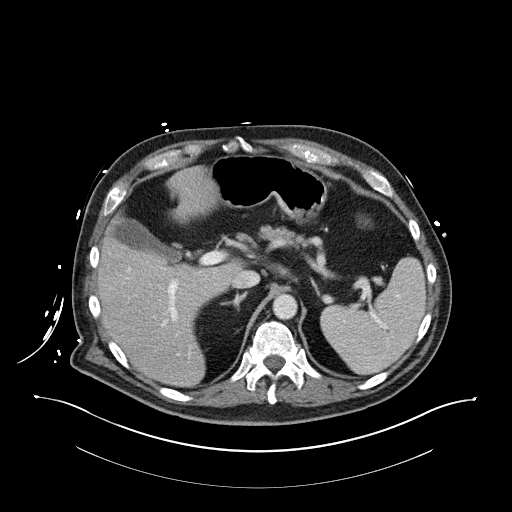
[im 78/104  lung]
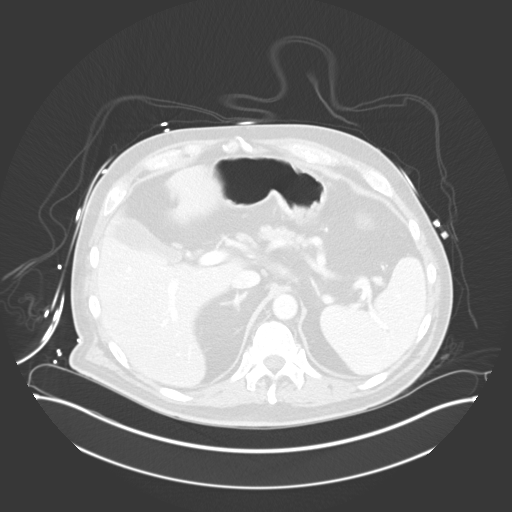
[im 91/104  soft-tissue]
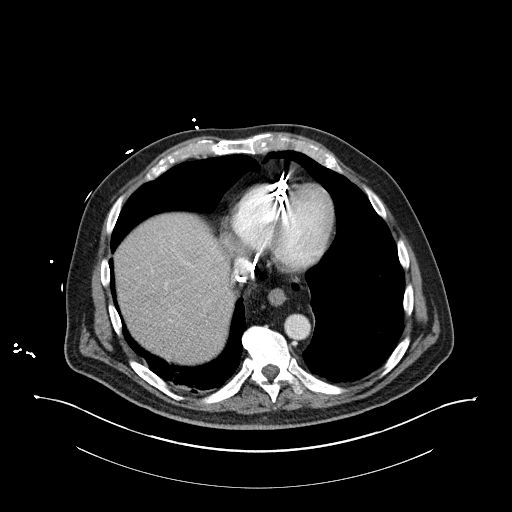
[im 91/104  lung]
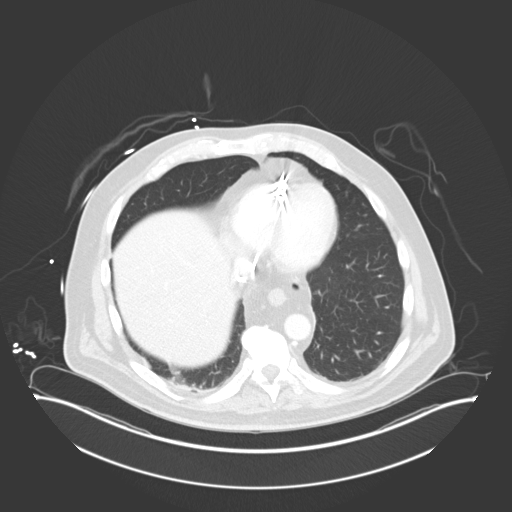

[11 of 46 positions shown; findings below may reference images not displayed]

RADIATION DOSE REDUCTION: This exam was performed according to the
departmental dose-optimization program which includes automated
exposure control, adjustment of the mA and/or kV according to
patient size and/or use of iterative reconstruction technique.

CONTRAST:  100mL OMNIPAQUE IOHEXOL 300 MG/ML  SOLN
FINDINGS: Lower chest: Mild left basilar atelectatic changes are noted.

Hepatobiliary: Mild fatty infiltration of the liver is noted. The
gallbladder is within normal limits.

Pancreas: Unremarkable. No pancreatic ductal dilatation or
surrounding inflammatory changes.

Spleen: Normal in size without focal abnormality.

Adrenals/Urinary Tract: Adrenal glands are within normal limits.
Kidneys are well visualize within normal enhancement pattern. No
renal calculi are seen. The bladder is partially distended.

Stomach/Bowel: Changes of previous bowel surgery are again seen
consistent with the given clinical history. Loop ileostomy is noted
in the right mid abdomen new from the prior study. Some mild fluid
and air is noted within the anterior abdominal wall consistent with
the recent surgery. Additionally a tiny focus of free air is noted
upper abdomen but felt to be related to the recent surgery. Stomach
is within normal limits. Visualized small bowel shows no obstructive
changes.

Vascular/Lymphatic: Aortic atherosclerosis. No enlarged abdominal or
pelvic lymph nodes.

Reproductive: Prostate is unremarkable.

Other: In the mid abdomen there are multiple fluid collections
identified consistent with the postoperative state. Some of these
demonstrate slight increased density likely related to postoperative
hematomas. No air is noted within to suggest developing abscess. The
most inferior of these lies in the midportion of the pelvis
measuring approximately 10 point 5 x 4.4 cm in greatest transverse
and AP dimensions respectively. A E second large collection is noted
in the right mid abdomen which appears to inter communicate with
multiple smaller collections. It measures 8.6 x 5.6 cm in greatest
dimension. Again it appears to inter connect with multiple smaller
collections in the upper abdomen as well as a somewhat bilobed
collection anteriorly again with increased attenuation suggestive of
postoperative hematoma.

Musculoskeletal: Degenerative changes of lumbar spine are noted. No
acute bony abnormality is seen.
IMPRESSION: Changes consistent with the known history of recent bowel surgery
with some air and fluid in the subcutaneous fat level as well as a
tiny focus of free air within the abdomen felt to be related to the
prior surgery given its tiny size. New loop ileostomy is noted

There are multiple fluid collections scattered throughout the
abdomen as described which demonstrate some slight increased
attenuation and felt to be related to postoperative hematomas. No
air is noted within although infection of these hematomas cannot be
totally excluded given the elevated white blood cell count.

## 2023-10-17 ENCOUNTER — Encounter: Payer: Self-pay | Admitting: Physical Therapy

## 2023-10-17 ENCOUNTER — Ambulatory Visit: Payer: Self-pay | Attending: Internal Medicine | Admitting: Physical Therapy

## 2023-10-17 DIAGNOSIS — R102 Pelvic and perineal pain: Secondary | ICD-10-CM | POA: Insufficient documentation

## 2023-10-17 DIAGNOSIS — R1084 Generalized abdominal pain: Secondary | ICD-10-CM | POA: Diagnosis present

## 2023-10-17 DIAGNOSIS — M6281 Muscle weakness (generalized): Secondary | ICD-10-CM | POA: Insufficient documentation

## 2023-10-17 DIAGNOSIS — R293 Abnormal posture: Secondary | ICD-10-CM | POA: Insufficient documentation

## 2023-10-17 DIAGNOSIS — L905 Scar conditions and fibrosis of skin: Secondary | ICD-10-CM | POA: Diagnosis present

## 2023-10-17 DIAGNOSIS — R252 Cramp and spasm: Secondary | ICD-10-CM | POA: Insufficient documentation

## 2023-10-17 DIAGNOSIS — R262 Difficulty in walking, not elsewhere classified: Secondary | ICD-10-CM | POA: Diagnosis present

## 2023-10-17 NOTE — Therapy (Signed)
 OUTPATIENT PHYSICAL THERAPY MALE PELVIC TREATMENT   Patient Name: [name] MRN: [id number] DOB:02-10-1951, 73 y.o., male Today's Date: 10/17/2023    END OF SESSION:  PT End of Session - 10/17/23 0932     Visit Number 13    Date for PT Re-Evaluation 11/30/23    Authorization Type 12    Authorization - Visit Number 12    Authorization - Number of Visits 20    PT Start Time 0930    PT Stop Time 1010    PT Time Calculation (min) 40 min    Activity Tolerance Patient tolerated treatment well    Behavior During Therapy [facility] for tasks assessed/performed              Past Medical History:  Diagnosis Date   Acute prostatitis 07/24/2007   Qualifier: Diagnosis of  By: Clent Ridges MD, Jeannett Senior A    Acute respiratory failure with hypoxia (HCC)    Allergy    mild   Arthritis    osteoarthritis   Aspiration pneumonia (HCC) 05/24/2022   Asthma    Blood transfusion without reported diagnosis    BPH (benign prostatic hypertrophy) with urinary obstruction    Crohn's ileitis (HCC) suspected 05/03/2017   Dilated aortic root (HCC)    noted on echo 08/2012   Diverticulitis of colon    Drug-induced acute pancreatitis without infection or necrosis - from 6 MP 09/24/2017   EPIDIDYMITIS 02/15/2010   Qualifier: Diagnosis of  By: Clent Ridges MD, Tera Mater    GERD (gastroesophageal reflux disease)    H/O: GI bleed    Hemorrhoids    Hepatitis 1975   unknown type    HERPES SIMPLEX INFECTION 10/14/2007   Qualifier: Diagnosis of  By: Clent Ridges MD, Jeannett Senior A    Hiatal hernia    Ileus following gastrointestinal surgery (HCC) 12/26/2011   Long term (current) use of systemic steroids 06/18/2018   Psoriasis    sees Dr. Karene Fry at [facility].   Rectal fissure    Recurrent ventral incisional hernia 05/10/2012   Shingles    Short bowel syndrome    SVT (supraventricular tachycardia) (HCC)    Ulcer 08/21/2013   ileal   Past Surgical History:  Procedure Laterality Date   BOWEL RESECTION  12/19/2011    Procedure: SMALL BOWEL RESECTION;  Surgeon: Mariella Saa, MD;  Location: [facility];  Service: General;  Laterality: N/A;  with anastamosis and insertion mesh   BRONCHOSCOPY     COLON SURGERY  01/2004   x 2   COLONOSCOPY W/ BIOPSIES  04/26/2017   per Dr. Leone Payor, no polyps, benign inflammation, repeat in 5 yrs    CYSTOSCOPY     ESOPHAGOGASTRODUODENOSCOPY     HEMICOLECTOMY     left side, at [facility], diverticulitis   HEMORRHOID BANDING     HERNIA REPAIR     [phone] incisional hernia   ILEOSTOMY     ILEOSTOMY CLOSURE     INSERTION OF MESH  07/31/2012   Procedure: INSERTION OF MESH;  Surgeon: Mariella Saa, MD;  Location: [facility];  Service: General;;   LAPAROTOMY  12/19/2011   Procedure: EXPLORATORY LAPAROTOMY;  Surgeon: Mariella Saa, MD;  Location: [facility];  Service: General;  Laterality: N/A;   PACEMAKER IMPLANT N/A 12/01/2020   Procedure: PACEMAKER IMPLANT;  Surgeon: Regan Lemming, MD;  Location: [facility];  Service: Cardiovascular;  Laterality: N/A;   PACEMAKER INSERTION Left    TONSILLECTOMY     UPPER  GASTROINTESTINAL ENDOSCOPY     VASECTOMY     VENTRAL HERNIA REPAIR  07/31/2012   Procedure: HERNIA REPAIR VENTRAL ADULT;  Surgeon: Mariella Saa, MD;  Location: [facility];  Service: General;  Laterality: N/A;   Patient Active Problem List   Diagnosis Date Noted   Difficult intravenous access 06/25/2023   Generalized weakness 05/24/2022   Drug overdose, intentional, initial encounter (HCC) 05/24/2022   Chronic pain syndrome 05/24/2022   Chronic anemia    Chronic prostatitis 05/01/2022   Bladder calculi 05/01/2022   Dysuria    Pelvic floor dysfunction 04/27/2022   Crohn's disease of small intestine with other complication (HCC) 02/13/2022   Protein-calorie malnutrition, severe 01/05/2022   Pacemaker 01/03/2022   External hemorrhoid, thrombosed 05/27/2019   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   Long-term use of immunosuppressant medication   -Rinvoq 06/28/2017   Crohn's ileitis (HCC)  05/03/2017   Psoriasis 08/23/2016   Insomnia 04/22/2014   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/06/2012   SVT (supraventricular tachycardia) (HCC) 08/04/2012   Vitamin D deficiency 06/18/2012   Attention deficit hyperactivity disorder (ADHD) 05/28/2009   BPH (benign prostatic hyperplasia) 07/24/2007   Asthma 03/06/2007   GERD 03/06/2007   DIVERTICULITIS, HX OF 03/06/2007    PCP: [name], MD   REFERRING PROVIDER: Iva Boop, MD   REFERRING DIAG: R19.8 (ICD-10-CM) - Abdominal weakness   THERAPY DIAG:  Muscle weakness (generalized)  Cramp and spasm  Generalized abdominal pain  Scar adherent  Pelvic pain  Abnormal posture  Difficulty in walking, not elsewhere classified  Rationale for Evaluation and Treatment: Rehabilitation  ONSET DATE: 2005  SUBJECTIVE:                                                                                                                                                                                           SUBJECTIVE STATEMENT: Patient feels his stomach is getting stronger. It engages better with lifting things. I feel more flexible.  Fluid intake: Yes: does not drink caffeine, water     PAIN:  09/26/23 Are you having pain? Yes: NPRS scale: 6/10 Pain location: abdomen Pain description: constant Aggravating factors: stress Relieving factors: treatment  PAIN:  09/26/23 Are you having pain? Yes NPRS scale: 8/10; 09/17/23 Pain location: between anus and scrotum Pain type: dull and pressure,sharp Pain description: constant more at night  Aggravating factors: sitting, urinating, bowel movement, waking up in the morning.  Relieving factors: walking, moving  PRECAUTIONS: ICD/Pacemaker  RED FLAGS: None   WEIGHT BEARING RESTRICTIONS: No  FALLS:  Has patient fallen in last 6 months? Yes. Number of falls water main cover was  not put back properly and tripped on it and recovered so  not due to balance  LIVING ENVIRONMENT: Lives with: lives with their spouse   OCCUPATION: retired  PLOF: Independent  PATIENT GOALS: build endurance back, strength of abdominal wall, reduction of pelvic pain  PERTINENT HISTORY:  Chron's disease of ilium; short bowel syndrome; chronic posterior anal fissure;, small bowel anastomosis's resection x 2, mesh removal x2; ileostomy closure  BOWEL MOVEMENT: Pain with bowel movement: No Type of bowel movement:Type (Bristol Stool Scale) Type 7, Frequency 5, Strain No, and Splinting no  goes multiple times due to short bowel syndrome Fully empty rectum: Yes:   Leakage: Yes: sometimes Pads: Yes: diaper Fiber supplement: No he is not suppose to  URINATION: Pain with urination: Yes Fully empty bladder: Yes:   Stream: Strong Urgency: Yes: sometimes Frequency: every 2 hours Leakage:  none   INTERCOURSE:Not active     OBJECTIVE:  Note: Objective measures were completed at Evaluation unless otherwise noted.   COGNITION: Overall cognitive status: Within functional limits for tasks assessed       FUNCTIONAL TESTS:  6 minute walk test: exertion 5 for 1601 feet 09/17/23 6 minutes walked 1335 feet with exertion of 5 moderate 09/26/23 6 minute walk test 1491 feet with exertion for 4 10/17/23: 6 minute walk test 1211 feet with exertion for 3   GAIT: Distance walked: 6 minutes walked 1601 feet with exertion of 5 moderate Assistive device utilized: None Level of assistance: Complete Independence Comments: walks with decreased trunk extension, decreased hip extension 08/01/23.  1304 feet  with exertion scale is 6  POSTURE: rounded shoulders, forward head, decreased lumbar lordosis, increased thoracic kyphosis, posterior pelvic tilt, and flexed trunk   PELVIC ALIGNMENT: ASIS is equal  LUMBARAROM/PROM:  A/PROM A/PROM  eval 07/25/23 08/27/23 09/17/23  Flexion full full full full  Extension 100% limitation Decreased by 75% Decreased by 50%  Decreased by 25%  Right lateral flexion Decreased by 25% Decreased by 25% Decreased by 25% full  Left lateral flexion Decreased by 25% Decreased by 25% full full  Right rotation Decreased by 25% Decreased by 25% full full  Left rotation Decreased by 25% Decreased by 25% Full but painful in right abdominal full   (Blank rows = not tested)    LOWER EXTREMITY MMT:  MMT Right eval Left eval Right 07/25/23 Left  07/25/23 Right/left  08/27/23 Right/left 09/17/23  Hip extension 3/5 3/5 4/5 4/5 4+/5 4+/5  Hip abduction 4/5 3+/5 4/5 4/5 4/5 4+/5   PALPATION:   General  rib cage is restricted, multiple abdominal scars that are restricted. Unable to tighten his stomach and will lift up his rib cage instead.                 External Perineal Exam ischiocavernosus, bulbocavernosus,                              Internal Pelvic Floor tenderness located in bilateral levator ani, obturator internist, coccygeus  Patient confirms identification and approves PT to assess internal pelvic floor and treatment Yes  PELVIC MMT:   MMT eval 09/17/23  Internal Anal Sphincter 1/5 3/5  External Anal Sphincter 1/5 3/5  Puborectalis 2/5 3/5  (Blank rows = not tested)        TONE: increased   TODAY'S TREATMENT:   10/17/23 Neuromuscular re-education: Form correction: Bridges to stretch the anterior trunk Dead bug to increase abdominal strength Hip  flexion isometric to engage the abdominals  Lay on side with trunk flat on mat with trunk rotation pushing arm up to the sky to work the obliques.   Therapeutic activities: Functional strengthening activities: Laying on side and go into sitting to work the core and not strain as much Sit to stand working on not using arms and core   09/26/23 Manual: Scar tissue mobilization: Scar massage to improve tissue mobility Neuromuscular re-education: Core facilitation: Prone bilateral shoulder horizontal abduction to increase thoracic extension and opening up the  stomach Prone with hands behind head and lift elbows Prone bilateral arms at his side and bilateral shoulder extension  09/17/23 Manual: Soft tissue mobilization: Manual work to the bulbocavernosus, ischiocavernosus, perineal body to lengthen the muscles and reduce the trigger points Internal pelvic floor techniques: No emotional/communication barriers or cognitive limitation. Patient is motivated to learn. Patient understands and agrees with treatment goals and plan. PT explains patient will be examined in standing, sitting, and lying down to see how their muscles and joints work. When they are ready, they will be asked to remove their underwear so PT can examine their perineum. The patient is also given the option of providing their own chaperone as one is not provided in our facility. The patient also has the right and is explained the right to defer or refuse any part of the evaluation or treatment including the internal exam. With the patient's consent, PT will use one gloved finger to gently assess the muscles of the pelvic floor, seeing how well it contracts and relaxes and if there is muscle symmetry. After, the patient will get dressed and PT and patient will discuss exam findings and plan of care. PT and patient discuss plan of care, schedule, attendance policy and HEP activities.  To assess strength but too tender for manual work Therapeutic activities: Functional strengthening activities: Sit on ball to massage the pelvic floor.      PATIENT EDUCATION: 10/17/23 Education details: Access Code: [email or web address], educated patient on perineal massage and sent you tube video, information of dry needling  Person educated: Patient Education method: Explanation, Demonstration, Tactile cues, Verbal cues, and Handouts Education comprehension: verbalized understanding, returned demonstration, verbal cues required, tactile cues required, and needs further education    HOME EXERCISE  PROGRAM: 10/17/23 Access Code: [email or web address] URL: [email or web address] Date: 10/17/2023 Prepared by: Eulis Foster  Exercises - Supine Figure 4 Piriformis Stretch  - 1 x daily - 7 x weekly - 1 sets - 2 reps - 30 sec hold - Supine Piriformis Stretch with Leg Straight  - 1 x daily - 7 x weekly - 1 sets - 2 reps - 30 sec hold - Sidelying Thoracic Rotation with Open Book  - 1 x daily - 7 x weekly - 1 sets - 2 reps - 30 sec hold - Prone Diaphragmatic Breathing  - 1 x daily - 7 x weekly - 3 sets - 10 reps - Diaphragmatic Breathing in Child's Pose with Pelvic Floor Relaxation  - 1 x daily - 7 x weekly - 3 sets - 10 reps - Sidelying Diaphragmatic Breathing  - 1 x daily - 7 x weekly - 3 sets - 10 reps - Dead Bug  - 1 x daily - 3 sets - 10 reps - Supine Bridge  - 1 x daily - 3 x weekly - 2 sets - 10 reps - Hooklying Isometric Hip Flexion  - 1 x daily - 3 x weekly - 2 sets - 10 reps  ASSESSMENT:  CLINICAL IMPRESSION: Patient  did 6 minute walk test with 1211 feet with exertion scale of 3 and patient needs guidance to walk faster and what exertion level he is.  Patient has a yeast infection around the rectum and still taking the medication.   Patient is having increased endurance due to being out to so more things. Patient is encouraged to work on walking faster at home. Patient will benefit from skilled therapy to improve overall endurance, posture, strength and reduce his pain.   OBJECTIVE IMPAIRMENTS: Abnormal gait, decreased activity tolerance, decreased coordination, decreased endurance, difficulty walking, decreased ROM, decreased strength, increased fascial restrictions, increased muscle spasms, impaired tone, postural dysfunction, and pain.   ACTIVITY LIMITATIONS: sitting, standing, continence, toileting, and locomotion level  PARTICIPATION LIMITATIONS: shopping and community activity  PERSONAL FACTORS: Fitness, Time since onset of injury/illness/exacerbation, and 3+ comorbidities:  Chron's disease of ilium; short bowel syndrome; chronic posterior anal fissure;, small bowel anastomosis's resection x 2, mesh removal x2; ileostomy closure  are also affecting patient's functional outcome.   REHAB POTENTIAL: Excellent  CLINICAL DECISION MAKING: Evolving/moderate complexity  EVALUATION COMPLEXITY: Moderate   GOALS: Goals reviewed with patient? Yes  SHORT TERM GOALS: Target date: 06/15/23  Patient independent with initial HEP for strengthening and flexibility.  Baseline: Goal status: MET 07/17/23  2.  Patient understands how to perform scar massage to lengthen the tissue.  Baseline:  Goal status: MET 07/17/23  3.  Patient reports his abdominal pain decreased >/= 25 % due to reduction of restrictions.  Baseline:  Goal status: Met 08/27/23  4.  Patient is able to contract his abdominals with not lifting his rib cage.  Baseline:  Goal status: Met 08/27/23  5.  Patient has increased lumbar extension by 25% due to increased lumbar ROM and length of his abdominals.  Baseline:  Goal status:MET 07/17/23  6.  Patient able to do the 6 minute walk test with exertion </= 2/10.  Baseline:  Goal status: In progress  LONG TERM GOALS: Target date: 11/30/23  Patient independent with advanced HEP for strength, flexibility, and endurance.  Baseline:  Goal status: Ongoing 09/17/23  2.  Patient pelvic floor strength >/= 3/5 holding for 40 seconds so his fecal leakage decreased >/= 75%.  Baseline:  Goal status: ongoing 09/17/23  3.  Patient is able to walk >/= 1729 feet in 6 minutes with 0-1 exertion due to increased strength and rib mobility.  Baseline:  Goal status: ongoing 09/17/23  4.  Patient is able to stand upright due to increased length of the anterior trunk and lumbar extension improve by 50%.  Baseline: improved by 25% Goal status: Met 09/17/23  5.  Patient has reduction of pelvic pain decreased >/= 50% due to improve abdominal strength and elongation.  Baseline:  decreased by 50%  Goal status: met 09/17/23 6.  Patient reports his pain level is </= 5/10 when going to sleep so he is able to fall asleep Baseline:  Goal status: INITIAL    PLAN:  PT FREQUENCY: 2x/week  PT DURATION: 8 weeks  PLANNED INTERVENTIONS: 97110-Therapeutic exercises, 97530- Therapeutic activity, 97112- Neuromuscular re-education, 97140- Manual therapy, 97116- Gait training, 28413- Ultrasound, Patient/Family education, Balance training, Dry Needling, Joint manipulation, Spinal mobilization, Scar mobilization, Cryotherapy, Moist heat, and Biofeedback  PLAN FOR NEXT SESSION:   manual work to pelvic floor (left side), abdominal work, 6 minute walk test, Back extensor strength.   Eulis Foster, PT 10/17/23 10:15 AM

## 2023-10-23 NOTE — Progress Notes (Signed)
 Remote pacemaker transmission.

## 2023-10-23 NOTE — Addendum Note (Signed)
 Addended by: Geralyn Flash D on: 10/23/2023 03:48 PM   Modules accepted: Orders

## 2023-10-24 ENCOUNTER — Encounter: Payer: Medicare Other | Admitting: Physical Therapy

## 2023-10-29 ENCOUNTER — Ambulatory Visit: Payer: Medicare Other | Admitting: Physical Therapy

## 2023-10-29 ENCOUNTER — Ambulatory Visit (INDEPENDENT_AMBULATORY_CARE_PROVIDER_SITE_OTHER): Admitting: Psychology

## 2023-10-29 ENCOUNTER — Ambulatory Visit: Payer: Self-pay

## 2023-10-29 ENCOUNTER — Telehealth: Payer: Self-pay | Admitting: Physical Therapy

## 2023-10-29 DIAGNOSIS — R419 Unspecified symptoms and signs involving cognitive functions and awareness: Secondary | ICD-10-CM | POA: Diagnosis not present

## 2023-10-29 NOTE — Progress Notes (Unsigned)
 NEUROPSYCHOLOGICAL EVALUATION [place]  [place] Department of Neurology  Date of Evaluation: 10/29/2023  REASON FOR REFERRAL   [name] is a*** ***-year-old, {KDEISSHANDEDNESS:32240}-handed, *** male with *** years of formal education. He was referred for neuropsychological evaluation by his neurologist, {KDEISSPROVIDERS1:32312}, to assess current neurocognitive functioning, document potential cognitive deficits, and assist with treatment planning. This is his first neuropsychological evaluation.  He previously underwent neuropsychological evaluation on *** at ***. {KDEISSPREVIOUSNP:32242}  DISPOSITION   Patient will follow up with the referring provider, {KDEISSNEUROLOGISTS:32243}. {KDEISSFOLLOWUP:32247}. He and *** will be provided verbal feedback in approximately one week regarding the findings and impression during this visit.  The remainder of the report includes the details of the patient's background and a table of results from the current evaluation, which support the summary and recommendations described above.  BACKGROUND   History of Presenting Illness: The following information was obtained from a review of medical records and an interview with the patient and ***. ***  Previous Neuropsychological Evaluation(s): ***  Cognitive Functioning: During today's appointment, the patient reported ***.  Physical Functioning: Patient denied difficulties with sleep initiation and maintenance. Appetite is stable. No changes to sense of taste or smell were reported. Vision and hearing are stable. ***RBD, falls, tremor, speech, incontinence, pain***  Emotional Functioning: Patient reported his current mood as "***." ***  Imaging: MRI of the brain (***) documented ***.  Other Relevant Medical History: Remarkable for ***. No history of stroke, CNS infection, head injury, or seizure was reported.***  Current Medications: Per patient, ***.  The patient  denied taking medications.*** At this time, he reports only taking vitamins and supplements.***  Functional Status: Patient independently performs all ADLs and IADLs, including driving, without difficulty.***  Family Neurological History: Remarkable for ***  Psychiatric History: Remarkable for ***.  History of depression, anxiety, prior mental health treatment, suicidal ideation, hallucinations, and psychiatric hospitalizations was not reported.  Substance Use History: {KDEISSSUBSTANCES:32250}  Social and Developmental History: Patient was born in ***. History of perinatal complications and developmental delays was not reported. Language ***. Patient is *** and has *** children who live locally. Patient resides in ***.  Educational and Occupational History: No history of childhood learning disability, special education services, or grade retention was reported. Patient completed *** or obtained a *** degree. Patient was employed as ***. Patient retired in [employer]   BEHAVIORAL OBSERVATIONS   Patient arrived {KDEISSBO1:32254} and was accompanied by ***. He ambulated {KDEISSBO2:32255}. He was alert and {KDEISSBO3:32256}. He was appropriately groomed and dressed for the setting. No significant sensory or motor abnormalities*** were observed. Vision and hearing *** were adequate for testing purposes. {KDEISSBO6:32259}. No conversational word-finding difficulties, paraphasic errors, or dysarthria were observed. Comprehension was conversationally intact. Thought processes were {KDEISSBO4:32257}. Thought content was organized and devoid of delusions. Insight appeared {KDEISSBO5:32258}. Affect was *** and congruent with {KDEISSBO7:32260} mood. He was cooperative and gave adequate effort during testing, including on standalone and embedded measures of performance validity***. Results are thought to accurately reflect his cognitive functioning at this time.  NEUROPSYCHOLOGICAL TESTING RESULTS    ***  INFORMED CONSENT   Patient was provided with a verbal description of the nature and purpose of the neuropsychological evaluation. Also reviewed were the foreseeable risks and/or discomforts and benefits of the procedure, limits of confidentiality, and mandatory reporting requirements of this provider. Patient was given the opportunity to have their questions answered. Oral consent to participate was provided by the patient.   This report was prepared as  part of a clinical evaluation and is not intended for forensic use.  SERVICE   This evaluation was conducted by Janice Meeter, Psy.D. In addition to time spent directly with the patient, total professional time includes record review, integration of relevant medical history, test selection, interpretation of findings, and report preparation.  Psychiatric Diagnostic Evaluation Services (Professional): 16109 x 1 Neuropsychological Testing Evaluation Services (Professional): 60454 x 1  This report was generated using voice recognition software. While this document has been carefully reviewed, transcription errors may be present. I apologize in advance for any inconvenience. Please contact me if further clarification is needed.            Janice Meeter, Psy.D.             Neuropsychologist

## 2023-10-29 NOTE — Addendum Note (Signed)
 Addended by: Michalene Agee on: 10/29/2023 09:37 AM   Modules accepted: Orders

## 2023-10-29 NOTE — Telephone Encounter (Signed)
 Called patient about missed appointment and left a message.  Marsha Skeen, PT @4 /14/25@ 4:09 PM

## 2023-11-05 ENCOUNTER — Encounter: Admitting: Psychology

## 2023-11-06 ENCOUNTER — Encounter: Payer: Self-pay | Admitting: Family Medicine

## 2023-11-06 ENCOUNTER — Ambulatory Visit (HOSPITAL_COMMUNITY)
Admission: RE | Admit: 2023-11-06 | Discharge: 2023-11-06 | Disposition: A | Discharge status: Home / Self Care | Source: Ambulatory Visit | Attending: Physician Assistant | Admitting: Physician Assistant

## 2023-11-06 ENCOUNTER — Ambulatory Visit (INDEPENDENT_AMBULATORY_CARE_PROVIDER_SITE_OTHER): Admitting: Family Medicine

## 2023-11-06 VITALS — BP 108/60 | HR 59 | Temp 97.8°F | Wt 168.0 lb

## 2023-11-06 DIAGNOSIS — R413 Other amnesia: Secondary | ICD-10-CM | POA: Insufficient documentation

## 2023-11-06 DIAGNOSIS — F1190 Opioid use, unspecified, uncomplicated: Secondary | ICD-10-CM | POA: Diagnosis not present

## 2023-11-06 DIAGNOSIS — M6289 Other specified disorders of muscle: Secondary | ICD-10-CM

## 2023-11-06 DIAGNOSIS — R531 Weakness: Secondary | ICD-10-CM

## 2023-11-06 MED ORDER — HYDROCODONE-ACETAMINOPHEN 5-325 MG PO TABS: 1.0000 | ORAL_TABLET | Freq: Three times a day (TID) | ORAL | 0 refills | Status: DC | PRN

## 2023-11-06 NOTE — Progress Notes (Signed)
   Subjective:    Patient ID: [name], male    DOB: June 27, 1951, 73 y.o.   MRN: [id number]  HPI Here for pain management. His pelvic pain is still a problem for him despite working with PT. He tries to use the Norco as little as possible.    Review of Systems  Constitutional: Negative.        Objective:   Physical Exam Constitutional:      Appearance: Normal appearance.  Neurological:     Mental Status: He is alert.           Assessment & Plan:  Pain management. Indication for chronic opioid: pelvic pain Medication and dose: Norco 5-325 # pills per month: 90 Last UDS date: 05-25-23 Opioid Treatment Agreement signed (Y/N): 05-25-23 Opioid Treatment Agreement last reviewed with patient:  11-06-23 [facility] reviewed this encounter (include red flags): Yes Meds were refilled.  Corita Diego, MD

## 2023-11-06 NOTE — Progress Notes (Signed)
  Device system confirmed to be MRI conditional, with implant date > 6 weeks ago, and no evidence of abandoned or epicardial leads in review of most recent CXR  Device last cleared by EP Provider: Creighton Doffing NP  11/06/2023 Clearance is good through for 1 year as long as parameters remain stable at time of check. If pt undergoes a cardiac device procedure during that time, they should be re-cleared.   Tachy-therapies to be programmed off if applicable with device back to pre-MRI settings after completion of exam.  [employer] will be present for programming for the MRI.   Jerline Moon  11/06/2023 9:31 AM

## 2023-11-07 LAB — TSH: TSH: 1.34 u[IU]/mL (ref 0.35–5.50)

## 2023-11-07 LAB — VITAMIN B12: Vitamin B-12: 148 pg/mL — ABNORMAL LOW (ref 211–911)

## 2023-11-07 NOTE — CV Procedure (Signed)
  Device system confirmed to be MRI conditional, with implant date > 6 weeks ago, and no evidence of abandoned or epicardial leads in review of most recent CXR  Device last cleared by EP Provider: Creighton Doffing   Clearance is good through for 1 year as long as parameters remain stable at time of check. If pt undergoes a cardiac device procedure during that time, they should be re-cleared.   Tachy-therapies to be programmed off if applicable with device back to pre-MRI settings after completion of exam.  [employer] will be present for programming for the MRI.   Arlys Lamer, RT  11/07/2023 11:00 AM

## 2023-11-07 NOTE — Progress Notes (Signed)
 The MRI of the brain  showed mild hardening of the small blood vessels in the brain in patients with high cholesterol, blood pressure or sugar control issues, sometimes age as well. [name] is similar to the MRI from 2023. It did not show any tumors, stroke or bleed.Thanks

## 2023-11-11 LAB — VITAMIN B1: Vitamin B1 (Thiamine): 22 nmol/L (ref 8–30)

## 2023-11-12 ENCOUNTER — Ambulatory Visit (INDEPENDENT_AMBULATORY_CARE_PROVIDER_SITE_OTHER)

## 2023-11-12 DIAGNOSIS — E538 Deficiency of other specified B group vitamins: Secondary | ICD-10-CM | POA: Diagnosis not present

## 2023-11-12 MED ORDER — CYANOCOBALAMIN 1000 MCG/ML IJ SOLN
1000.0000 ug | Freq: Once | INTRAMUSCULAR | Status: AC
  Administered 2023-11-12: 1000 ug via INTRAMUSCULAR

## 2023-11-12 NOTE — Progress Notes (Signed)
 Patient is in office today for a nurse visit for B12 Injection. Patient Injection was given in the  Right deltoid. Patient tolerated injection well.

## 2023-11-16 ENCOUNTER — Ambulatory Visit (INDEPENDENT_AMBULATORY_CARE_PROVIDER_SITE_OTHER): Admitting: Psychology

## 2023-11-16 ENCOUNTER — Ambulatory Visit: Attending: Internal Medicine | Admitting: Physical Therapy

## 2023-11-16 ENCOUNTER — Encounter: Payer: Self-pay | Admitting: Physical Therapy

## 2023-11-16 ENCOUNTER — Ambulatory Visit: Payer: Self-pay | Admitting: Psychology

## 2023-11-16 DIAGNOSIS — L905 Scar conditions and fibrosis of skin: Secondary | ICD-10-CM | POA: Diagnosis present

## 2023-11-16 DIAGNOSIS — M6281 Muscle weakness (generalized): Secondary | ICD-10-CM | POA: Insufficient documentation

## 2023-11-16 DIAGNOSIS — R262 Difficulty in walking, not elsewhere classified: Secondary | ICD-10-CM | POA: Insufficient documentation

## 2023-11-16 DIAGNOSIS — R4189 Other symptoms and signs involving cognitive functions and awareness: Secondary | ICD-10-CM

## 2023-11-16 DIAGNOSIS — R102 Pelvic and perineal pain: Secondary | ICD-10-CM | POA: Insufficient documentation

## 2023-11-16 DIAGNOSIS — R1084 Generalized abdominal pain: Secondary | ICD-10-CM | POA: Insufficient documentation

## 2023-11-16 DIAGNOSIS — R293 Abnormal posture: Secondary | ICD-10-CM | POA: Diagnosis present

## 2023-11-16 DIAGNOSIS — R252 Cramp and spasm: Secondary | ICD-10-CM | POA: Insufficient documentation

## 2023-11-16 NOTE — Therapy (Signed)
 OUTPATIENT PHYSICAL THERAPY MALE PELVIC TREATMENT   Patient Name: [name] MRN: [id number] DOB:Dec 03, 1950, 73 y.o., male Today's Date: 11/16/2023    END OF SESSION:  PT End of Session - 11/16/23 1102     Visit Number 14    Date for PT Re-Evaluation 11/30/23    Authorization Type 14    Authorization - Visit Number 14    Authorization - Number of Visits 20    PT Start Time 1100    PT Stop Time 1140    PT Time Calculation (min) 40 min    Activity Tolerance Patient tolerated treatment well    Behavior During Therapy [facility] for tasks assessed/performed              Past Medical History:  Diagnosis Date   Acute prostatitis 07/24/2007   Qualifier: Diagnosis of  By: Alyne Babinski MD, Mara Seminole A    Acute respiratory failure with hypoxia (HCC)    Allergy    mild   Arthritis    osteoarthritis   Aspiration pneumonia (HCC) 05/24/2022   Asthma    Blood transfusion without reported diagnosis    BPH (benign prostatic hypertrophy) with urinary obstruction    Crohn's ileitis (HCC) suspected 05/03/2017   Dilated aortic root (HCC)    noted on echo 08/2012   Diverticulitis of colon    Drug-induced acute pancreatitis without infection or necrosis - from 6 MP 09/24/2017   EPIDIDYMITIS 02/15/2010   Qualifier: Diagnosis of  By: Alyne Babinski MD, Lenis Quin    GERD (gastroesophageal reflux disease)    H/O: GI bleed    Hemorrhoids    Hepatitis 1975   unknown type    HERPES SIMPLEX INFECTION 10/14/2007   Qualifier: Diagnosis of  By: Alyne Babinski MD, Mara Seminole A    Hiatal hernia    Ileus following gastrointestinal surgery (HCC) 12/26/2011   Long term (current) use of systemic steroids 06/18/2018   Psoriasis    sees Dr. Jaclynn Mast at [facility].   Rectal fissure    Recurrent ventral incisional hernia 05/10/2012   Shingles    Short bowel syndrome    SVT (supraventricular tachycardia) (HCC)    Ulcer 08/21/2013   ileal   Past Surgical History:  Procedure Laterality Date   BOWEL RESECTION  12/19/2011    Procedure: SMALL BOWEL RESECTION;  Surgeon: Quitman Bucy, MD;  Location: [facility];  Service: General;  Laterality: N/A;  with anastamosis and insertion mesh   BRONCHOSCOPY     COLON SURGERY  01/2004   x 2   COLONOSCOPY W/ BIOPSIES  04/26/2017   per Dr. Willy Harvest, no polyps, benign inflammation, repeat in 5 yrs    CYSTOSCOPY     ESOPHAGOGASTRODUODENOSCOPY     HEMICOLECTOMY     left side, at [facility], diverticulitis   HEMORRHOID BANDING     HERNIA REPAIR     [phone] incisional hernia   ILEOSTOMY     ILEOSTOMY CLOSURE     INSERTION OF MESH  07/31/2012   Procedure: INSERTION OF MESH;  Surgeon: Quitman Bucy, MD;  Location: [facility];  Service: General;;   LAPAROTOMY  12/19/2011   Procedure: EXPLORATORY LAPAROTOMY;  Surgeon: Quitman Bucy, MD;  Location: [facility];  Service: General;  Laterality: N/A;   PACEMAKER IMPLANT N/A 12/01/2020   Procedure: PACEMAKER IMPLANT;  Surgeon: Lei Pump, MD;  Location: [facility];  Service: Cardiovascular;  Laterality: N/A;   PACEMAKER INSERTION Left    TONSILLECTOMY     UPPER  GASTROINTESTINAL ENDOSCOPY     VASECTOMY     VENTRAL HERNIA REPAIR  07/31/2012   Procedure: HERNIA REPAIR VENTRAL ADULT;  Surgeon: Quitman Bucy, MD;  Location: [facility];  Service: General;  Laterality: N/A;   Patient Active Problem List   Diagnosis Date Noted   Difficult intravenous access 06/25/2023   Generalized weakness 05/24/2022   Drug overdose, intentional, initial encounter (HCC) 05/24/2022   Chronic pain syndrome 05/24/2022   Chronic anemia    Chronic prostatitis 05/01/2022   Bladder calculi 05/01/2022   Dysuria    Pelvic floor dysfunction 04/27/2022   Crohn's disease of small intestine with other complication (HCC) 02/13/2022   Protein-calorie malnutrition, severe 01/05/2022   Pacemaker 01/03/2022   External hemorrhoid, thrombosed 05/27/2019   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   Long-term use of immunosuppressant medication   -Rinvoq  06/28/2017   Crohn's ileitis (HCC)  05/03/2017   Psoriasis 08/23/2016   Insomnia 04/22/2014   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/06/2012   SVT (supraventricular tachycardia) (HCC) 08/04/2012   Vitamin D  deficiency 06/18/2012   Attention deficit hyperactivity disorder (ADHD) 05/28/2009   BPH (benign prostatic hyperplasia) 07/24/2007   Asthma 03/06/2007   GERD 03/06/2007   DIVERTICULITIS, HX OF 03/06/2007    PCP: Donley Furth, MD   REFERRING PROVIDER: Kenney Peacemaker, MD   REFERRING DIAG: R19.8 (ICD-10-CM) - Abdominal weakness   THERAPY DIAG:  Muscle weakness (generalized)  Cramp and spasm  Generalized abdominal pain  Scar adherent  Pelvic pain  Abnormal posture  Difficulty in walking, not elsewhere classified  Rationale for Evaluation and Treatment: Rehabilitation  ONSET DATE: 2005  SUBJECTIVE:                                                                                                                                                                                           SUBJECTIVE STATEMENT: I have a broken foot so I can not do the walking. The pelvic floor is flared up. The pelvic floor pain is increased. I see the doctor next week for the pelvic floor. More pain when going to sleep. The pain becomes intense.  Fluid intake: Yes: does not drink caffeine, water      PAIN:  09/26/23 Are you having pain? Yes: NPRS scale: 6/10 Pain location: abdomen Pain description: constant Aggravating factors: stress Relieving factors: treatment  PAIN:  09/26/23 Are you having pain? Yes NPRS scale: 8/10; 09/17/23 11/16/23: 8-9/10 at night, during the day 4/10 Pain location: between anus and scrotum Pain type: dull and pressure,sharp Pain description: constant more at night  Aggravating factors: sitting, urinating, bowel movement, waking up in the morning.  Relieving factors: walking, moving  PRECAUTIONS: ICD/Pacemaker  RED FLAGS: None   WEIGHT BEARING  RESTRICTIONS: No  FALLS:  Has patient fallen in last 6 months? Yes. Number of falls water  main cover was not put back properly and tripped on it and recovered so not due to balance  LIVING ENVIRONMENT: Lives with: lives with their spouse   OCCUPATION: retired  PLOF: Independent  PATIENT GOALS: build endurance back, strength of abdominal wall, reduction of pelvic pain  PERTINENT HISTORY:  Chron's disease of ilium; short bowel syndrome; chronic posterior anal fissure;, small bowel anastomosis's resection x 2, mesh removal x2; ileostomy closure  BOWEL MOVEMENT: Pain with bowel movement: No Type of bowel movement:Type (Bristol Stool Scale) Type 7, Frequency 5, Strain No, and Splinting no  goes multiple times due to short bowel syndrome Fully empty rectum: Yes:   Leakage: Yes: sometimes 11/16/23: still gets some stool leakage and is mostly at night Pads: Yes: diaper Fiber supplement: No he is not suppose to  URINATION: Pain with urination: Yes Fully empty bladder: Yes:   Stream: Strong Urgency: Yes: sometimes Frequency: every 2 hours Leakage:  none   INTERCOURSE:Not active     OBJECTIVE:  Note: Objective measures were completed at Evaluation unless otherwise noted.   COGNITION: Overall cognitive status: Within functional limits for tasks assessed       FUNCTIONAL TESTS:  6 minute walk test: exertion 5 for 1601 feet 09/17/23 6 minutes walked 1335 feet with exertion of 5 moderate 09/26/23 6 minute walk test 1491 feet with exertion for 4 10/17/23: 6 minute walk test 1211 feet with exertion for 3   GAIT: Distance walked: 6 minutes walked 1601 feet with exertion of 5 moderate Assistive device utilized: None Level of assistance: Complete Independence Comments: walks with decreased trunk extension, decreased hip extension 08/01/23.  1304 feet  with exertion scale is 6  POSTURE: rounded shoulders, forward head, decreased lumbar lordosis, increased thoracic kyphosis,  posterior pelvic tilt, and flexed trunk   PELVIC ALIGNMENT: ASIS is equal  LUMBARAROM/PROM:  A/PROM A/PROM  eval 07/25/23 08/27/23 09/17/23 11/16/23  Flexion full full full full full  Extension 100% limitation Decreased by 75% Decreased by 50% Decreased by 25% Decreased by 25%  Right lateral flexion Decreased by 25% Decreased by 25% Decreased by 25% full full  Left lateral flexion Decreased by 25% Decreased by 25% full full full  Right rotation Decreased by 25% Decreased by 25% full full full  Left rotation Decreased by 25% Decreased by 25% Full but painful in right abdominal full full   (Blank rows = not tested)    LOWER EXTREMITY MMT:  MMT Right eval Left eval Right 07/25/23 Left  07/25/23 Right/left  08/27/23 Right/left 09/17/23 Right/left 11/16/23  Hip extension 3/5 3/5 4/5 4/5 4+/5 4+/5 4+/5  Hip abduction 4/5 3+/5 4/5 4/5 4/5 4+/5 4+/5   PALPATION:   General  rib cage is restricted, multiple abdominal scars that are restricted. Unable to tighten his stomach and will lift up his rib cage instead.                 External Perineal Exam ischiocavernosus, bulbocavernosus,                              Internal Pelvic Floor tenderness located in bilateral levator ani, obturator internist, coccygeus  Patient confirms identification and approves PT to assess internal pelvic floor and treatment Yes  PELVIC MMT:  MMT eval 09/17/23  Internal Anal Sphincter 1/5 3/5  External Anal Sphincter 1/5 3/5  Puborectalis 2/5 3/5  (Blank rows = not tested)        TONE: increased   TODAY'S TREATMENT:   11/16/23 Exercises: Stretches/mobility: Supine figure 4 stretch holding 30 sec 3 times each Open book 10 times each way.  Pulling knee across the body in supine holding 30 sec bil.  Strengthening: Bridge 10 x holding 10 sec Hip flexion isometric holding 5 sec 10 x each side Supine flex alternate shoulder and hip 10 x each side.   10/17/23 Neuromuscular re-education: Form  correction: Bridges to stretch the anterior trunk Dead bug to increase abdominal strength Hip flexion isometric to engage the abdominals  Lay on side with trunk flat on mat with trunk rotation pushing arm up to the sky to work the obliques.   Therapeutic activities: Functional strengthening activities: Laying on side and go into sitting to work the core and not strain as much Sit to stand working on not using arms and core   09/26/23 Manual: Scar tissue mobilization: Scar massage to improve tissue mobility Neuromuscular re-education: Core facilitation: Prone bilateral shoulder horizontal abduction to increase thoracic extension and opening up the stomach Prone with hands behind head and lift elbows Prone bilateral arms at his side and bilateral shoulder extension  09/17/23 Manual: Soft tissue mobilization: Manual work to the bulbocavernosus, ischiocavernosus, perineal body to lengthen the muscles and reduce the trigger points Internal pelvic floor techniques: No emotional/communication barriers or cognitive limitation. Patient is motivated to learn. Patient understands and agrees with treatment goals and plan. PT explains patient will be examined in standing, sitting, and lying down to see how their muscles and joints work. When they are ready, they will be asked to remove their underwear so PT can examine their perineum. The patient is also given the option of providing their own chaperone as one is not provided in our facility. The patient also has the right and is explained the right to defer or refuse any part of the evaluation or treatment including the internal exam. With the patient's consent, PT will use one gloved finger to gently assess the muscles of the pelvic floor, seeing how well it contracts and relaxes and if there is muscle symmetry. After, the patient will get dressed and PT and patient will discuss exam findings and plan of care. PT and patient discuss plan of care,  schedule, attendance policy and HEP activities.  To assess strength but too tender for manual work Therapeutic activities: Functional strengthening activities: Sit on ball to massage the pelvic floor.      PATIENT EDUCATION: 11/16/23 Education details: Access Code: [email or web address], educated patient on perineal massage and sent you tube video, information of dry needling  Person educated: Patient Education method: Explanation, Demonstration, Tactile cues, Verbal cues, and Handouts Education comprehension: verbalized understanding, returned demonstration, verbal cues required, tactile cues required, and needs further education    HOME EXERCISE PROGRAM: 11/16/23 Access Code: [email or web address] URL: [email or web address] Date: 10/17/2023 Prepared by: Marsha Skeen  Exercises - Supine Figure 4 Piriformis Stretch  - 1 x daily - 7 x weekly - 1 sets - 2 reps - 30 sec hold - Supine Piriformis Stretch with Leg Straight  - 1 x daily - 7 x weekly - 1 sets - 2 reps - 30 sec hold - Sidelying Thoracic Rotation with Open Book  - 1 x daily - 7 x weekly - 1 sets - 2 reps - 30  sec hold - Prone Diaphragmatic Breathing  - 1 x daily - 7 x weekly - 3 sets - 10 reps - Diaphragmatic Breathing in Child's Pose with Pelvic Floor Relaxation  - 1 x daily - 7 x weekly - 3 sets - 10 reps - Sidelying Diaphragmatic Breathing  - 1 x daily - 7 x weekly - 3 sets - 10 reps - Dead Bug  - 1 x daily - 3 sets - 10 reps - Supine Bridge  - 1 x daily - 3 x weekly - 2 sets - 10 reps - Hooklying Isometric Hip Flexion  - 1 x daily - 3 x weekly - 2 sets - 10 reps  ASSESSMENT:  CLINICAL IMPRESSION: Patient  did 6 minute walk test with 1211 feet with exertion scale of 3 and patient needs guidance to walk faster and what exertion level he is.  He has 3/5 strength of pelvic floor but not able to hold 40 sec. Patient is having a flare-up with his pelvic floor with pain and will be seeing Dr. Parke Boll to have it addressed. He is able to stand  upright better. He was not able to do the 6 minute walk test today due to breaking his foot the other day. He is independent with his HEP. He knows how to perform scar massage to his abdomen.   OBJECTIVE IMPAIRMENTS: Abnormal gait, decreased activity tolerance, decreased coordination, decreased endurance, difficulty walking, decreased ROM, decreased strength, increased fascial restrictions, increased muscle spasms, impaired tone, postural dysfunction, and pain.   ACTIVITY LIMITATIONS: sitting, standing, continence, toileting, and locomotion level  PARTICIPATION LIMITATIONS: shopping and community activity  PERSONAL FACTORS: Fitness, Time since onset of injury/illness/exacerbation, and 3+ comorbidities: Chron's disease of ilium; short bowel syndrome; chronic posterior anal fissure;, small bowel anastomosis's resection x 2, mesh removal x2; ileostomy closure  are also affecting patient's functional outcome.   REHAB POTENTIAL: Excellent  CLINICAL DECISION MAKING: Evolving/moderate complexity  EVALUATION COMPLEXITY: Moderate   GOALS: Goals reviewed with patient? Yes  SHORT TERM GOALS: Target date: 06/15/23  Patient independent with initial HEP for strengthening and flexibility.  Baseline: Goal status: MET 07/17/23  2.  Patient understands how to perform scar massage to lengthen the tissue.  Baseline:  Goal status: MET 07/17/23  3.  Patient reports his abdominal pain decreased >/= 25 % due to reduction of restrictions.  Baseline:  Goal status: Met 08/27/23  4.  Patient is able to contract his abdominals with not lifting his rib cage.  Baseline:  Goal status: Met 08/27/23  5.  Patient has increased lumbar extension by 25% due to increased lumbar ROM and length of his abdominals.  Baseline:  Goal status:MET 07/17/23  6.  Patient able to do the 6 minute walk test with exertion </= 2/10.  Baseline:  Goal status: In progress  LONG TERM GOALS: Target date: 11/30/23  Patient  independent with advanced HEP for strength, flexibility, and endurance.  Baseline:  Goal status: met 11/16/23  2.  Patient pelvic floor strength >/= 3/5 holding for 40 seconds so his fecal leakage decreased >/= 75%.  Baseline:  Goal status: not met 11/16/23  3.  Patient is able to walk >/= 1729 feet in 6 minutes with 0-1 exertion due to increased strength and rib mobility.  Baseline:  Goal status: not met 11/16/23  4.  Patient is able to stand upright due to increased length of the anterior trunk and lumbar extension improve by 50%.  Baseline: improved by 25% Goal status: Met  09/17/23  5.  Patient has reduction of pelvic pain decreased >/= 50% due to improve abdominal strength and elongation.  Baseline: decreased by 50%  Goal status: met 09/17/23 6.  Patient reports his pain level is </= 5/10 when going to sleep so he is able to fall asleep Baseline: pain level 8-9/10 (11/16/23) Goal status: not met 11/16/23    PLAN: Discharge to HEP   Marsha Skeen, PT 11/16/23 11:42 AM  PHYSICAL THERAPY DISCHARGE SUMMARY  Visits from Start of Care: 14  Current functional level related to goals / functional outcomes: See above.    Remaining deficits: See above. Patient is having trouble managing his pelvic floor and abdominal pain. He is going to his pelvic floor doctor to be investigated further. Patient has not met the walking goal due to medical issues that have come about over time.    Education / Equipment: HEP Rectal wand to work on the pelvic floor.    Patient agrees to discharge. Patient goals were not met. Patient is being discharged due to maximized rehab potential.  Thank you for the referral.   Marsha Skeen, PT 11/16/23 11:42 AM

## 2023-11-16 NOTE — Progress Notes (Signed)
   Psychometrician Note   Cognitive testing was administered to [name] by Arnulfo Larch, B.S. (psychometrist) under the supervision of Dr. Janice Meeter, Psy.D., licensed psychologist on 11/16/2023. [name] did not appear overtly distressed by the testing session per behavioral observation or responses across self-report questionnaires. Rest breaks were offered.    The battery of tests administered was selected by Dr. Janice Meeter, Psy.D. with consideration to [name]'s current level of functioning, the nature of his symptoms, emotional and behavioral responses during interview, level of literacy, observed level of motivation/effort, and the nature of the referral question. This battery was communicated to the psychometrist. Communication between Dr. Janice Meeter, Psy.D. and the psychometrist was ongoing throughout the evaluation and Dr. Janice Meeter, Psy.D. was immediately accessible at all times. Dr. Janice Meeter, Psy.D. provided supervision to the psychometrist on the date of this service to the extent necessary to assure the quality of all services provided.    [name] will return within approximately 1-2 weeks for an interactive feedback session with Dr. Donavon Fudge at which time his test performances, clinical impressions, and treatment recommendations will be reviewed in detail. [name] understands he can contact our office should he require our assistance before this time.  A total of 170 minutes of billable time were spent face-to-face with [name] by the psychometrist. This includes both test administration and scoring time. Billing for these services is reflected in the clinical report generated by Dr. Janice Meeter, Psy.D.  This note reflects time spent with the psychometrician and does not include test scores or any clinical interpretations made by Dr. Donavon Fudge. The full report will follow in a separate note.

## 2023-11-16 NOTE — Progress Notes (Unsigned)
 NEUROPSYCHOLOGICAL EVALUATION [place]  [place] Department of Neurology  Date of Evaluation: 11/16/2023  REASON FOR REFERRAL   [name] is a 73 year old, left-handed, White male with 18 years of formal education. He was referred for neuropsychological evaluation by Tex Filbert, PA-C, to assess current neurocognitive functioning, document potential cognitive deficits, and assist with treatment planning. This is his first neuropsychological evaluation, although records indicate he underwent cognitive testing with speech therapy during his hospital stay in 2023.  SUMMARY OF RESULTS   Premorbid cognitive abilities are estimated to be in the average range based on word reading and sociodemographic factors. Given this estimate, performance today was generally intact with the exception of a few low scores on tasks of rapid color naming, bilateral fine motor dexterity, and select aspects of learning/memory.   Regarding the latter, he demonstrated intact encoding, recall, and recognition of short stories, but his performance was more variable with the word list and shapes. Specifically, immediate and delayed recall of the word list was within normal limits, though recognition was slightly reduced. Item-level analysis suggests this was due to limited initial learning--he recognized only nine words, which were the same ones recalled during encoding trials, while the three unrecognized words were never recalled. Of the two false positive errors, both were semantically related. On the visual memory task, he demonstrated more difficulty with initial acquisition but retained nearly all that he had learned, suggesting that delayed recall was limited by initial encoding. His recognition of the shapes was generally adequate.  Scores on remaining measures were intact, including in the domains of attention/working memory, processing speed, executive functioning, language, and visuospatial  skills. On self-report questionnaires, he endorsed mild symptoms of anxiety, though nearly all of his responses related to physical symptoms that overlap with known medical conditions. He did not endorse clinically-elevated levels of depression or excessive daytime sleepiness.  DIAGNOSTIC IMPRESSION   Results of the current evaluation indicated broadly normal test performance, with the exception of a few weaknesses in rapid color naming, bilateral fine motor dexterity, and select aspects of learning/memory, though memory performance was largely intact. Functional independence is maintained. There is not enough impairment at this time to warrant a diagnosis of mild cognitive impairment. Etiology of the weaknesses observed today is likely multifactorial, including ongoing recovery from prolonged hospitalization, chronic pain and physical discomfort, variable sleep, daytime fatigue and low energy, mood symptoms, mild cerebrovascular changes, and possible medication side effects (e.g., hyoscyamine  and oxybutynin ). To the extent that modifiable factors can be ameliorated, he may find that his subjective cognitive concerns improve. Results further serve as a baseline for future comparison, should it ever become necessary to re-evaluate the patient. Recommendations are listed below.   ICD-10 Codes: R41.89 Cognitive changes  RECOMMENDATIONS   In consultation with your doctor, schedule cognitive reevaluation on an as-needed basis to assess for cognitive decline and update treatment recommendations. Reevaluation should occur during a period of medical and affective stability.  Monitor mood given recent health-related changes, as emotional distress can contribute to or exacerbate cognitive difficulties. If mood symptoms begin to interfere with daily functioning, consider discussing your current medication regimen with your prescribing provider to ensure optimal benefit. You may also wish to explore additional  treatment options, such as counseling.  Prioritize healthy lifestyle choices to support overall physical and cognitive well-being. Recommendations include maintaining a balanced diet (e.g., Mediterranean diet), engaging in regular physician-approved physical activity, and attending routine medical appointments to manage health concerns.  Aim to regularly  participate in activities that you find enjoyable and fulfilling, while being mindful of your physical limitations. This could include hobbies, socializing with loved ones, or engaging in low-impact outdoor activities. Such involvement can help improve mood, increase motivation, and provide cognitive stimulation.  If interested, there are some activities which have therapeutic value and can be useful in keeping you cognitively stimulated:  https://www.[facility].org/get-to-know-barrow/centers-programs/neurorehabilitation-center/neuro-rehab-apps-and-games/  Consider implementing compensatory strategies to maximize independence and maintain daily functioning. Examples include:   -Adhere to routine. Compensatory strategies work best when they are used consistently. Use a planner, calendar, or white board that has the schedule and important events for the day clearly listed to reference and cross off when tasks are complete.   -Ask for written information, especially if it is new or unfamiliar (e.g., information provided at a doctor's appointment).   -Create an organized environment. Keep items that can be easily misplaced in a sensible location and get into the habit of always returning the items to those places.   -Pay attention and reduce distractions. Make a point of focusing attention on information you want to remember. One-on-one interaction is more likely to facilitate attention and minimize distraction. Make eye contact and repeat the information out loud after you hear it. Reduce interruptions or distractions especially when attempting to  learn new information.   -Create associations. When learning something new, think about and understand the information. Explain it in your own words or try to associate it with something you already know. Take notes to help remember important details.  -Evaluate goals and plan accordingly. When confronted by many different tasks, begin by making a list that prioritizes each task and estimates the time it will take to complete. Break down complicated tasks into smaller, more manageable steps.   -Focus on one task at a time and complete each task before starting another. Avoid multitasking.  DISPOSITION   Patient will follow up with the referring provider, Ms. Wertman. No follow-up neuropsychological testing was scheduled at this time. Please feel free to refer the patient for repeated evaluation if he shows a significant change in neurocognitive status. He will be provided verbal feedback in approximately one week regarding the findings and impression during this visit.  The remainder of the report includes the details of the patient's background and a table of results from the current evaluation, which support the summary and recommendations described above.  BACKGROUND   History of Presenting Illness: The following information was obtained from a review of medical records and an interview with the patient. Patient established care with neurology on 09/27/2023 due to cognitive concerns in the setting of a complex medical history with prolonged hospitalization. MoCA = 22/30. He has a known history of Crohn's disease, s/p extensive surgical interventions, and reported a hospitalization in 2023 lasting "over 200 days" due to both surgical procedures and related complications. Hospital course included, but was not limited to, delirium, acute metabolic encephalopathy, aspiration pneumonia, dysphagia, severe malnutrition, chronic anemia, acute respiratory failure with hypoxia, generalized weakness, and  chronic pain syndrome. Please refer to the medical record for additional details regarding the course and complexity of his hospitalization.  Previous Inpatient Cognitive Evaluation with Speech Therapy on 03/02/2022: Per note by Darla Edward, MS CF-SLP, "Patient participated in the Cognitive-Linguistic Quick Test (CLQT) to assess cognitive-linguistic function. Patient demonstrated moderate impairment in attention and mild impairments in memory, executive functions, language, and visuospatial skills according to standardized assessment. Patient scored WNL for clock drawing task. He scored below the average  age-norm score in symbol cancellation, symbol trials, generative naming, mazes, and design generation subtests. Patient aware of errors across subtests, stating 'I probably messed that one up' and self-correcting errors when possible. Moderate impairment in attention through subtests and observed as patient asked to look at symbol again during symbol cancellation task and would ask for instructions during subtests. Patient reported difficulty throughout subtests relating to patient positioning and discomfort."  Cognitive Functioning: During today's appointment, the patient reported noticing cognitive changes since 2023 and expressed uncertainty about the cause, speculating whether it might be related to his prolonged hospitalization, medications, chronic pain, normal aging, etc. He endorsed longstanding attention difficulties, including a recent tendency to hyperfocus on tasks that are not always the most relevant or salient, along with more recent concerns related to slowed processing speed and word-finding difficulties. He denied significant difficulties with short-term memory, navigation, or executive functioning.  Physical Functioning: Patient reported improved sleep with trazodone  and melatonin, although he still experiences difficulties with sleep maintenance on occasion. He naps during the  day and endorses generally low energy levels. He described his appetite as "okay but not great." Dysphagia has resolved. No changes to sense of taste or smell were reported. Vision and hearing are stable. He occasionally experiences reduced balance but denies any recent falls. He reported occasional bilateral hand tremors, which he suspects may be medication induced. Today, he rated his pain level as 7/10, noting that he withheld pain medications in preparation for the visit.  Emotional Functioning: Patient endorsed symptoms of anxiety and depression, which he attributes to chronic pain and physical limitations. He denied any suicidal ideation. Although physically limited at this time, he continues to participate in physical therapy as part of his post-surgical recovery. He enjoys talking with his grandchildren, walking the dog, and playing video games.  Imaging: MRI of the brain (11/06/2023) documented  mild chronic small vessel ischemic disease and mild generalized cerebral atrophy.  Other Relevant Medical History: Remarkable for Crohn's disease, s/p pacemaker, benign prostatic hyperplasia, chronic prostatitis, pelvic floor dysfunction, psoriasis asthma, and GERD. No history of stroke, CNS infection, head injury, or seizure was reported.  Current Medications and Vitamins/Supplements: Per record, acetaminophen , albuterol  inhaler, nifedipine-lidocaine  ointment, ascorbic acid, buspirone , calcium  carbonate, cholecalciferol, cholestyramine , clotrimazole -betamethasone , diphenoxylate -atropine , ferrous sulfate , finasteride , gabapentin , hydrocodone -acetaminophen , hyoscyamine , magnesium , melatonin, multiple vitamin, ondansetron , oxybutynin , pantoprazole , phenazopyridine , potassium, rifaximin , simethicone , thiamine , trazodone , upadacitinib , and valacyclovir .  Functional Status: Patient independently performs all ADLs and IADLs without reported difficulty.  Family Neurological History: Unremarkable.  Psychiatric  History: History of depression and anxiety related to physical health/chronic medical conditions. Patient receives ongoing psychiatric care through [facility], where he is seen by a physician assistant every other month for medication management. He denied history of counseling, suicidal ideation, hallucinations, and psychiatric hospitalization.   Of note, medical records document a history of intentional drug overdose, labeled as a suicide attempt, in 2023 around the time he was hospitalized for Crohn's related issues. When asked about the documented overdose today, the patient explained that he had been experiencing severe pelvic pain and, after feeling dismissed by his physician, used two suppositories--rather than the prescribed one--in an effort to manage his symptoms. He firmly maintained that this was not a suicide attempt but rather a desperate measure to address unrelieved pain. At the time, psychiatric hospitalization was recommended; however, the patient declined, stating that he was not suicidal and felt his medical needs required more immediate attention. Although he was ultimately not admitted to inpatient psychiatric care, he continued to meet  with psychiatry throughout his hospital stay as recommended and was first prescribed psychotropic medications at that time.  Substance Use History: Patient denied current use of alcohol, nicotine, marijuana, and illicit substances.  Social and Developmental History: Patient was born in [place], [place]. History of perinatal complications and developmental delays was not reported. Patient is married and has two children. He lives with his wife in their private residence.  Educational and Occupational History: No history of childhood learning disability, special education services, or grade retention was reported. Patient suspects that he likely had undiagnosed ADHD in childhood. He reported that his typical grades were in the C range. 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  BEHAVIORAL OBSERVATIONS   Patient arrived on time and was unaccompanied. He ambulated independently and without gait disturbance. He was alert and fully oriented. He was appropriately groomed and dressed for the setting. No significant sensory or motor abnormalities were observed. Vision (corrected) and hearing were adequate for testing purposes. Speech was of normal rate, prosody, and volume. No conversational word-finding difficulties, paraphasic errors, or dysarthria were observed. Comprehension was conversationally intact. Thought processes were linear, logical, and coherent. Thought content was organized and devoid of delusions. Insight appeared appropriate. Affect was even and congruent with euthymic mood. He was cooperative during testing. He required several urgent restroom breaks. Score was slightly below the cutoff on one standalone performance validity measure, though this appeared to be due to a few impulsive errors, as the patient was making a strong effort to respond quickly. His overall engagement did not raise significant concern, and given that most test scores were within expected ranges, results are thought to accurately reflect his current cognitive functioning.  NEUROPSYCHOLOGICAL TESTING RESULTS   Tests Administered: Animal Naming Test; Beck Anxiety Inventory (BAI); Beck Depression Inventory II (BDI-II); Brief Visuospatial Memory Test-Revised (BVMT-R) - Form 1; Controlled Oral Word Association Test (COWAT): FAS; Delis-Kaplan Air cabin crew System (D-KEFS) - Subtest(s): Color-Word Interference Test; Epworth Sleepiness Scale (ESS); Grooved Pegboard Test; [employer] Learning Test Revised (HVLT-R) - From 1; Judgment of Line Orientation (JLO) - Form H; Neuropsychological Assessment Battery (NAB) - Subtest(s): Naming Form 1; Standalone performance validity test (PVT); Test of  Premorbid Functioning (TOPF); Trail Making Test (TMT); Wechsler Adult Intelligence Scale Fifth Edition (WAIS-5) - Subtest(s): Similarities, Clinical cytogeneticist, Matrix Reasoning, Digit Sequencing, Coding, Running Digits, Symbol Search, Symbol Span; and Wechsler Memory Scale Fourth Edition (WMS-IV) - Subtest(s): Logical Memory (LM).  Test results are provided in the table below. Whenever possible, the patient's scores were compared against age-, sex-, and education-corrected normative samples. Interpretive descriptions are based on the AACN consensus conference statement on uniform labeling (Guilmette et al., 2020).  PREMORBID FUNCTIONING RAW  RANGE  TOPF 33 StdS=93 Average  ATTENTION & WORKING MEMORY RAW  RANGE  WAIS-5 Digit Sequencing -- ss=8 Average  WAIS-5 Running Digits -- ss=10 Average  WAIS-5 Symbol Span -- ss=10 Average  PROCESSING SPEED RAW  RANGE  Trails A 39''0e T=41 Low Average  WAIS-5 Coding  -- ss=7 Low Average  WAIS-5 Symbol Search -- ss=7 Low Average  DKEFS CWIT Color Naming 52''4e ss=3 Exceptionally Low  DKEFS CWIT Word Reading 34''0e ss=6 Low Average  EXECUTIVE FUNCTION RAW  RANGE  Trails B 108''0e T=40 Low Average  WAIS-5 Matrix Reasoning -- ss=10 Average  WAIS-5 Similarities -- ss=13 High Average  COWAT Letter Fluency 9+10+13 T=39 Low Average  DKEFS CWIT Inhibition 86''4e ss=8  Average  DKEFS CWIT Inhibition/Switching 107''4e ss=6 Low Average  LANGUAGE RAW  RANGE  COWAT Letter Fluency 9+10+13 T=39 Low Average  Animal Naming Test 19 T=49 Average  NAB Naming Test 31/31 T=57 WNL  VISUOSPATIAL RAW    WAIS-5 Block Design -- ss=12 High Average  JLO 30/30 86+%ile WNL  BVMT-R Copy Trial 12/12 -- WNL  VERBAL LEARNING & MEMORY RAW  RANGE  HVLT Learning Trials (5+7+8)/36 T=42 Low Average  HVLT Delayed Recall 6/12 T=41 Low Average  HVLT Recognition Hits 9 -- --  HVLT Recognition False Positives 2 -- --  HVLT Discrimination Index 7 T=31 Below Average  WMS-IV LM-I  (5+10+9)/53 ss=7  Low Average  WMS-IV LM-II  (12+13)/39 ss=13 High Average  WMS-IV LM Recognition  (7+12)/23 51-75%ile Average  VISUOSPATIAL LEARNING & MEMORY RAW  RANGE  BVMT-R Total Recall (1+4+5)/36 T=30 Below Average  BVMT-R Delayed Recall 4/12 T=33 Below Average  BVMT-R Percent Retained 80 >16%ile WNL  BVMT-R Recognition Hits 4 6-10%ile Below Average to Low Average  BVMT-R Recognition False Alarms 0 >16%ile WNL  BVMT-R Recognition Discrimination Index 4 11-16%ile Low Average  FINE MOTOR DEXTERITY RAW  RANGE  Grooved Pegboard (Dominant Hand) 124''1d T=32 Below Average  Grooved Pegboard (Non-Dominant Hand) 135''0d T=33 Below Average  QUESTIONNAIRES RAW  RANGE  BDI 10 -- Minimal  BAI 9 -- Mild  ESS 6 -- WNL  *Note: ss = scaled score; StdS = standard score; T = t-score; C/S = corrected raw score; WNL = within normal limits; BNL= below normal limits; D/C = discontinued. Scores from skewed distributions are typically interpreted as WNL (>=16th %ile) or BNL (<16th %ile).   INFORMED CONSENT   Patient was provided with a verbal description of the nature and purpose of the neuropsychological evaluation. Also reviewed were the foreseeable risks and/or discomforts and benefits of the procedure, limits of confidentiality, and mandatory reporting requirements of this provider. Patient was given the opportunity to have their questions answered. Oral consent to participate was provided by the patient.   This report was prepared as part of a clinical evaluation and is not intended for forensic use.  SERVICE   This evaluation was conducted by Janice Meeter, Psy.D. In addition to time spent directly with the patient, total professional time includes record review, integration of relevant medical history, test selection, interpretation of findings, and report preparation. A technician, Arnulfo Larch, B.S., provided testing and scoring assistance for 170 minutes.  Neuropsychological Dance movement psychotherapist  (Professional): [id number] x 1 Neuropsychological Testing Evaluation Services (Professional): [id number] x 2 Neuropsychological Test Administration and Scoring (Technician): [phone] x 1 Neuropsychological Test Administration and Scoring (Technician): [phone]  This report was generated using voice recognition software. While this document has been carefully reviewed, transcription errors may be present. I apologize in advance for any inconvenience. Please contact me if further clarification is needed.            Janice Meeter, Psy.D.             Neuropsychologist

## 2023-11-19 ENCOUNTER — Ambulatory Visit

## 2023-11-19 ENCOUNTER — Ambulatory Visit: Admitting: Physical Therapy

## 2023-11-21 ENCOUNTER — Encounter: Payer: Self-pay | Admitting: Internal Medicine

## 2023-11-21 ENCOUNTER — Other Ambulatory Visit: Payer: Self-pay | Admitting: Internal Medicine

## 2023-11-21 MED ORDER — DIPHENOXYLATE-ATROPINE 2.5-0.025 MG PO TABS
2.0000 | ORAL_TABLET | Freq: Four times a day (QID) | ORAL | 5 refills | Status: DC | PRN
Start: 2023-11-21 — End: 2024-04-02

## 2023-11-22 ENCOUNTER — Ambulatory Visit: Admitting: Physician Assistant

## 2023-11-23 ENCOUNTER — Encounter: Admitting: Psychology

## 2023-11-26 ENCOUNTER — Ambulatory Visit (INDEPENDENT_AMBULATORY_CARE_PROVIDER_SITE_OTHER): Admitting: Psychology

## 2023-11-26 ENCOUNTER — Encounter: Admitting: Physical Therapy

## 2023-11-26 DIAGNOSIS — R4189 Other symptoms and signs involving cognitive functions and awareness: Secondary | ICD-10-CM | POA: Diagnosis not present

## 2023-11-26 NOTE — Progress Notes (Signed)
   NEUROPSYCHOLOGY FEEDBACK SESSION [place]  [place] Department of Neurology  Date of Feedback Session: 11/26/2023  REASON FOR REFERRAL   [name] is a 73 year old, left-handed, White male with 18 years of formal education. He was referred for neuropsychological evaluation by Tex Filbert, PA-C, to assess current neurocognitive functioning, document potential cognitive deficits, and assist with treatment planning. This is his first neuropsychological evaluation, although records indicate he underwent cognitive testing with speech therapy during his hospital stay in 2023.  FEEDBACK   Patient completed a comprehensive neuropsychological evaluation on 11/16/2023. Please refer to that encounter for the full report and recommendations. Briefly, results indicated broadly normal test performance, with the exception of a few weaknesses in rapid color naming, bilateral fine motor dexterity, and select aspects of learning/memory, though memory performance was largely intact. Functional independence is maintained. There is not enough impairment at this time to warrant a diagnosis of mild cognitive impairment. Etiology of the weaknesses observed today is likely multifactorial, including ongoing recovery from prolonged hospitalization, chronic pain and physical discomfort, variable sleep, daytime fatigue and low energy, mood symptoms, mild cerebrovascular changes, and possible medication side effects (e.g., hyoscyamine  and oxybutynin ).  Today, the patient was unaccompanied. He was provided verbal feedback regarding the findings and impression during this visit, and his questions were answered. A copy of the report was provided at the conclusion of the visit.  DISPOSITION   Patient will follow up with the referring provider, Ms. Wertman. No follow-up neuropsychological testing was scheduled at this time. Please feel free to refer the patient for repeated evaluation if he shows a  significant change in neurocognitive status.  SERVICE   This feedback session was conducted by Janice Meeter, Psy.D. One unit of 16[address] (35 minutes) was billed for Dr. Donavon Fudge' time spent in preparing, conducting, and documenting the current feedback session.  This report was generated using voice recognition software. While this document has been carefully reviewed, transcription errors may be present. I apologize in advance for any inconvenience. Please contact me if further clarification is needed.

## 2023-12-04 ENCOUNTER — Encounter: Payer: Self-pay | Admitting: Family Medicine

## 2023-12-04 ENCOUNTER — Ambulatory Visit (INDEPENDENT_AMBULATORY_CARE_PROVIDER_SITE_OTHER): Admitting: Physician Assistant

## 2023-12-04 ENCOUNTER — Encounter: Payer: Self-pay | Admitting: Physician Assistant

## 2023-12-04 VITALS — HR 83 | Resp 20 | Wt 167.0 lb

## 2023-12-04 DIAGNOSIS — R4189 Other symptoms and signs involving cognitive functions and awareness: Secondary | ICD-10-CM

## 2023-12-04 NOTE — Progress Notes (Signed)
 Assessment/Plan:   Memory concerns  [name] is a very pleasant 73 y.o. RH male seen today in follow up to discuss the MRI of the brain results performed on 3/13 /2025 . These were remarkable for mild chronic microvascular changes similar to the MRI performed in 2023, mild atrophy, no acute findings were noted. Neuropsych evaluation with broadly normal test performance. Patient is here alone.  All questions were answered to his satisfaction.   Recommendations No follow-up is indicated No antidementia medication is indicated Replenish vitamin B12 (148). Recommend good control of cardiovascular risk factors Recommend discuss sleep issues with his PCP, he may need sleep studies Continue to control mood as per PCP, may recommend psychotherapy Avoid polypharmacy    Neuropsych evaluation by 5/2/225 briefly, results indicated broadly normal test performance, with the exception of a few weaknesses in rapid color naming, bilateral fine motor dexterity, and select aspects of learning/memory, though memory performance was largely intact. Functional independence is maintained. There is not enough impairment at this time to warrant a diagnosis of mild cognitive impairment. Etiology of the weaknesses observed today is likely multifactorial, including ongoing recovery from prolonged hospitalization, chronic pain and physical discomfort, variable sleep, daytime fatigue and low energy, mood symptoms, mild cerebrovascular changes, and possible medication side effects (e.g., hyoscyamine  and oxybutynin ).    Initial visit 09/27/2023 How long did patient have memory difficulties?  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  Repeats oneself? Occasionally Disoriented when walking into a room?  Patient denies    Leaving objects in unusual places?  Loses the keys and other  objects.    Wandering behavior? denies   Any personality changes, or depression, anxiety? Denies  Hallucinations or paranoia? Denies.   Seizures? denies    Any sleep changes?  He sleeps well with trazodone , melatonin and hydrocodone  at night .  Denies vivid dreams, REM behavior or sleepwalking   Sleep apnea? Denies.   Any hygiene concerns?  Denies.   Independent of bathing and dressing? Endorsed  Does the patient need help with medications?  Patient is in charge   Who is in charge of the finances? Patient is in charge     Any changes in appetite?  Decreased appetite, sees GI, taking prednisone  to improve appetite    Patient have trouble swallowing?  Has a history of esophageal issues  after intubation in the past, now with some dysphagia followed by GI Does the patient cook?  Yes, denies any kitchen accidents Any headaches?  Denies.  Occasional sinus headaches. Chronic pain?taking hydrocodone  managed by PCP Ambulates with difficulty? Denies, walks his dog 3 times a day. Goes kayaking in [place].  Recent falls or head injuries?  He fell on ice 20 y ago, with LOC, no other falls. Vision changes? He has cataracts Any strokelike symptoms? Denies.   Any tremors? Medication induced tremors in hands, occasional -intermittent. Any anosmia? Denies.   Any incontinence of urine? Endorsed, uses pads due to prostate issues Any bowel dysfunction?  Has a history of Chron's  disease, status post ileostomy and colostomy.  Currently having a flare. He does a flare diet, and takes prednisone  and other GI meds.  Patient lives with wife  History of heavy alcohol intake? Denies.   History of heavy tobacco use? Denies.   Family history of dementia?  Denies.  Does patient drive?Yes, denies  any issues. 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   albuterol  (VENTOLIN  HFA) 108 (90 Base) MCG/ACT inhaler Inhale 2 puffs into the lungs every 6 (six) hours as needed for wheezing or shortness of breath.   AMBULATORY NON FORMULARY MEDICATION Medication Name: nifedipine 0.2%/lidocaine  5% ointment 1:1 mix Apply 3-4 tmes/day into anal canal   ascorbic acid (VITAMIN C) 250 MG CHEW Chew by mouth.   busPIRone  (BUSPAR ) 10 MG tablet Take 10 mg by mouth every morning.   calcium  carbonate (TUMS - DOSED IN MG ELEMENTAL CALCIUM ) 500 MG chewable tablet Chew 500 mg by mouth 3 (three) times daily as needed for indigestion or heartburn.   Cholecalciferol (VITAMIN D ) 50 MCG (2000 UT) tablet Take 1 tablet (2,000 Units total) by mouth daily.   cholestyramine  (QUESTRAN ) 4 g packet Take 1 packet (4 g total) by mouth daily with lunch.   clotrimazole -betamethasone  (LOTRISONE ) cream Apply 1 Application topically daily.   diphenoxylate -atropine  (LOMOTIL ) 2.5-0.025 MG tablet Take 2 tablets by mouth 4 (four) times daily as needed for diarrhea or loose stools. TAKE 2 TABLETS BY MOUTH FOUR TIMES DAILY AS NEEDED FOR DIARRHEA OR LOOSE STOOLS   ferrous sulfate  325 (65 FE) MG tablet Take 1 tablet (325 mg total) by mouth daily with breakfast.   finasteride  (PROSCAR ) 5 MG tablet Take 1 tablet (5 mg total) by mouth daily.   gabapentin  (NEURONTIN ) 100 MG capsule Take 1 capsule (100 mg total) by mouth at bedtime.   HYDROcodone -acetaminophen  (NORCO/VICODIN) 5-325 MG tablet Take 1 tablet by mouth every 8 (eight) hours as needed for moderate pain (pain score 4-6).   HYDROcodone -acetaminophen   (NORCO/VICODIN) 5-325 MG tablet Take 1 tablet by mouth every 8 (eight) hours as needed for moderate pain (pain score 4-6).   HYDROcodone -acetaminophen  (NORCO/VICODIN) 5-325 MG tablet Take 1 tablet by mouth every 8 (eight) hours as needed for moderate pain (pain score 4-6).   hyoscyamine  (LEVSIN SL) 0.125 MG SL tablet Place 0.125 mg under the tongue every 6 (six) hours as needed for cramping.   magnesium  oxide (MAG-OX) 400 MG tablet Take 1 tablet (400 mg total) by mouth 2 (two) times daily.   melatonin 3 MG TABS tablet Take 3 mg by mouth at bedtime.   Multiple Vitamin (MULTIVITAMIN WITH MINERALS) TABS tablet Take 1 tablet by mouth daily.   ondansetron  (ZOFRAN -ODT) 4 MG disintegrating tablet Dissolve 1 tablet (4 mg total) by mouth every 6 (six) hours as needed for nausea or vomiting.   oxybutynin  (DITROPAN -XL) 5 MG 24 hr tablet Take 5 mg by mouth daily.   pantoprazole  (PROTONIX ) 40 MG tablet TAKE 1 TABLET(40 MG) BY MOUTH TWICE DAILY BEFORE BREAKFAST AND SUPPER   phenazopyridine  (PYRIDIUM ) 200 MG tablet TAKE 1 TABLET(200 MG) BY MOUTH THREE TIMES DAILY AS NEEDED FOR URINARY PAIN   Potassium Chloride  ER 20 MEQ TBCR TAKE 1 TABLET(20 MEQ) BY MOUTH TWICE DAILY   rifaximin  (XIFAXAN ) 550 MG TABS tablet Take 1 tablet (550 mg total) by  mouth 3 (three) times daily.   simethicone  (MYLICON) 80 MG chewable tablet Chew 80 mg by mouth 4 (four) times daily -  with meals and at bedtime.   thiamine  (VITAMIN B1) 100 MG tablet Take 1 tablet (100 mg total) by mouth daily.   traZODone  (DESYREL ) 100 MG tablet Take 1 tablet (100 mg total) by mouth at bedtime.   Upadacitinib  ER (RINVOQ ) 30 MG TB24 Take 1 tablet (30 mg total) by mouth daily.   valACYclovir  (VALTREX ) 1000 MG tablet Take one tablet 3 times a day for 7 days as needed for shingles flares   No facility-administered encounter medications on file as of 12/04/2023.        No data to display            09/27/2023    3:00 PM  Montreal Cognitive Assessment    Visuospatial/ Executive (0/5) 3  Naming (0/3) 3  Attention: Read list of digits (0/2) 1  Attention: Read list of letters (0/1) 1  Attention: Serial 7 subtraction starting at 100 (0/3) 3  Language: Repeat phrase (0/2) 1  Language : Fluency (0/1) 1  Abstraction (0/2) 1  Delayed Recall (0/5) 4  Orientation (0/6) 4  Total 22  Adjusted Score (based on education) 22   Thank you for allowing us  the opportunity to participate in the care of this nice patient. Please do not hesitate to contact us  for any questions or concerns.   Total time spent on today's visit was 30 minutes dedicated to this patient today, preparing to see patient, examining the patient, ordering tests and/or medications and counseling the patient, documenting clinical information in the EHR or other health record, independently interpreting results and communicating results to the patient/family, discussing treatment and goals, answering patient's questions and coordinating care.  Cc:  Donley Furth, MD  Tex Filbert 12/04/2023 6:24 AM

## 2023-12-04 NOTE — Patient Instructions (Signed)
 No follow-up is indicated No antidementia medication is indicated Replenish vitamin B12 (148). Recommend good control of cardiovascular risk factors Continue physical therapy Recommend discuss sleep issues with the primary doctor  he may need sleep studies Continue to control mood as per PCP, continue psychotherapy Avoid polypharmacy

## 2023-12-06 NOTE — Telephone Encounter (Signed)
 He already has refills available until 02-05-24

## 2023-12-07 NOTE — Telephone Encounter (Signed)
 Spoke with pt states that he picked up Rx for Hydrocodone 

## 2023-12-12 ENCOUNTER — Encounter: Payer: Self-pay | Admitting: Internal Medicine

## 2023-12-12 ENCOUNTER — Other Ambulatory Visit (INDEPENDENT_AMBULATORY_CARE_PROVIDER_SITE_OTHER)

## 2023-12-12 ENCOUNTER — Ambulatory Visit: Admitting: Internal Medicine

## 2023-12-12 VITALS — BP 92/60 | HR 60 | Ht 70.0 in | Wt 166.5 lb

## 2023-12-12 DIAGNOSIS — K50018 Crohn's disease of small intestine with other complication: Secondary | ICD-10-CM

## 2023-12-12 DIAGNOSIS — R1031 Right lower quadrant pain: Secondary | ICD-10-CM

## 2023-12-12 DIAGNOSIS — Z7960 Long term (current) use of unspecified immunomodulators and immunosuppressants: Secondary | ICD-10-CM

## 2023-12-12 DIAGNOSIS — E538 Deficiency of other specified B group vitamins: Secondary | ICD-10-CM

## 2023-12-12 DIAGNOSIS — N411 Chronic prostatitis: Secondary | ICD-10-CM

## 2023-12-12 DIAGNOSIS — G8929 Other chronic pain: Secondary | ICD-10-CM

## 2023-12-12 DIAGNOSIS — M6289 Other specified disorders of muscle: Secondary | ICD-10-CM | POA: Diagnosis not present

## 2023-12-12 DIAGNOSIS — Z789 Other specified health status: Secondary | ICD-10-CM

## 2023-12-12 LAB — COMPREHENSIVE METABOLIC PANEL WITH GFR
ALT: 14 U/L (ref 0–53)
AST: 26 U/L (ref 0–37)
Albumin: 4.4 g/dL (ref 3.5–5.2)
Alkaline Phosphatase: 40 U/L (ref 39–117)
BUN: 18 mg/dL (ref 6–23)
CO2: 29 meq/L (ref 19–32)
Calcium: 9.5 mg/dL (ref 8.4–10.5)
Chloride: 104 meq/L (ref 96–112)
Creatinine, Ser: 1.27 mg/dL (ref 0.40–1.50)
GFR: 57.08 mL/min — ABNORMAL LOW (ref >60.00)
Glucose, Bld: 79 mg/dL (ref 70–99)
Potassium: 4.1 meq/L (ref 3.5–5.1)
Sodium: 138 meq/L (ref 135–145)
Total Bilirubin: 0.7 mg/dL (ref 0.2–1.2)
Total Protein: 6.5 g/dL (ref 6.0–8.3)

## 2023-12-12 LAB — CBC WITH DIFFERENTIAL/PLATELET
Basophils Absolute: 0 10*3/uL (ref 0.0–0.1)
Basophils Relative: 0.2 % (ref 0.0–3.0)
Eosinophils Absolute: 0 10*3/uL (ref 0.0–0.7)
Eosinophils Relative: 0.4 % (ref 0.0–5.0)
HCT: 37.5 % — ABNORMAL LOW (ref 39.0–52.0)
Hemoglobin: 12.6 g/dL — ABNORMAL LOW (ref 13.0–17.0)
Lymphocytes Relative: 29.7 % (ref 12.0–46.0)
Lymphs Abs: 1.6 10*3/uL (ref 0.7–4.0)
MCHC: 33.6 g/dL (ref 30.0–36.0)
MCV: 93.1 fl (ref 78.0–100.0)
Monocytes Absolute: 0.4 10*3/uL (ref 0.1–1.0)
Monocytes Relative: 7.3 % (ref 3.0–12.0)
Neutro Abs: 3.3 10*3/uL (ref 1.4–7.7)
Neutrophils Relative %: 62.4 % (ref 43.0–77.0)
Platelets: 166 10*3/uL (ref 150.0–400.0)
RBC: 4.02 Mil/uL — ABNORMAL LOW (ref 4.22–5.81)
RDW: 13.8 % (ref 11.5–15.5)
WBC: 5.2 10*3/uL (ref 4.0–10.5)

## 2023-12-12 NOTE — Patient Instructions (Signed)
 You have been scheduled for a colonoscopy. Please follow written instructions given to you at your visit today.   If you use inhalers (even only as needed), please bring them with you on the day of your procedure.  Your provider has requested that you go to the basement level for lab work before leaving today. Press "B" on the elevator. The lab is located at the first door on the left as you exit the elevator.  Due to recent changes in healthcare laws, you may see the results of your imaging and laboratory studies on MyChart before your provider has had a chance to review them.  We understand that in some cases there may be results that are confusing or concerning to you. Not all laboratory results come back in the same time frame and the provider may be waiting for multiple results in order to interpret others.  Please give us  48 hours in order for your provider to thoroughly review all the results before contacting the office for clarification of your results.   I appreciate the opportunity to care for you. Loy Ruff, MD, [facility] [place]

## 2023-12-12 NOTE — Progress Notes (Signed)
 [name] 73 y.o. [birth year] [id number]  Assessment & Plan:   Encounter Diagnoses  Name Primary?   Crohn's disease of small intestine with other complication (HCC) Yes   Long-term use of immunosuppressant medication    B12 deficiency    RLQ abdominal pain    Pelvic floor dysfunction and chronic pain    Chronic prostatitis    Difficult intravenous access    Schedule colonoscopy to follow-up his Crohn's disease.  Will do at the hospital due to difficult IV access and reduced tolerance of complications/higher risk of procedure.The risks and benefits as well as alternatives of endoscopic procedure(s) have been discussed and reviewed. All questions answered. The patient agrees to proceed.   Orders Placed This Encounter  Procedures   Procedural/ Surgical Case Request: COLONOSCOPY   CBC with Differential/Platelet   Comprehensive metabolic panel with GFR   Continue B12 supplementation through Dr. Alyne Callahan.  He will need this lifelong and I think he will need injection therapy given his small bowel resection.  We discussed his chronic pain.  Some of this is really chronic and precedes his recent Crohn's problems.  He has been to pelvic floor physical therapy through the [facility] system and [facility].  I think he may have some adhesion pain as well.  He is getting chronic narcotics for this through Dr. Alyne Callahan.  He asked about surgery as an option and I do not think that is a good idea i.e. adhesiolysis given how many problems he has had.  Will see what the colonoscopy shows regarding active Crohn's.  His last calprotectin was 99 which is borderline in March.  Certainly active Crohn's could be part of the picture.  Continue Rinvoq  30 mg daily.   Regarding diarrhea we will see what the colonoscopy tells us .  He does think that when he took a prednisone  taper earlier this year it helped some.  Unfortunately though I think there is a component of bile salt diarrhea likely this is  difficult to treat around his medication schedule.  At this point for grams daily is about the best we can do given the schedule.  He will continue Lomotil .  Empiric Xifaxan  has not helped.  CC: Justin Furth, MD   Subjective:  Gastroenterology problem summary   Crohn's disease of the small intestine-ileitis in a patient status post small bowel resection as below: June 2013 47 cm small bowel resection incarcerated ventral hernia Small intestine, resection - SMALL BOWEL WITH EXTENSIVE ACUTE SEROSITIS AND ASSOCIATED FAT NECROSIS AND ADHESIONS. - NO ATYPIA OR MALIGNANCY. - RESECTION MARGINS VIABLE.   Original Crohn's diagnosis October 2018 colonoscopy, budesonide  therapy then 6-MP plus Humira  early 2019, developed pancreatitis from 6-MP.  Additional budesonide  and then weekly Humira  but antibodies very high 04/05/2018 and loss of efficacy. Prednisone  used as budesonide  was not helpful, Stelara  tried early 2020 and then moved to Entyvio  fall 2020   2022 having problems with perineal and then abdominal pain and diarrhea progressed into 2023 and in March of that year CT showing enteritis/ileitis referred to Dr. Ara Callahan and had surgery       November 11, 2021 small bowel resection, two 9 cm long sections A. SMALL BOWEL ANASTOMOSIS, RESECTION:             - Anastomotic site, present.              - Small bowel mucosa with no significant abnormalities.              -  Serosa with fibrous adhesions and histiocytic giant cell reaction to mesh material.    B. MESH, REMOVAL:             - Mesh with attached adipose and membranous tissue, gross examination only.    C. SMALL BOWEL ANASTOMOSIS #2, RESECTION:             - Chronic active ileitis with pseudopolyp formation and mucosa ulceration.  - Anastomotic site identified.  - See comment.    D. MESH #2 , REMOVAL:             - Mesh with attached adipose tissue, gross examination only.    Several admissions due to high output ileostomy and need  for hydration and TPN rehab stays, also complicated by abdominal wall abscess requiring drainage.  Was on home TPN.  Dr. Ara Callahan did not want to takedown ileostomy. Was staying with his sister in [place] (she could help with TPN) and saw Dr. Allie AreaElbert Callahan at [facility]   08/29/2022 preoperative colonoscopy mild diversion colitis noted throughout the colon exam to long Hartmann stump and   08/30/2022 ileostomy reversal at [facility]      Small intestine (ileostomy trim): -Benign skin small intestinal tissue consistent with ostomy site.     Electronically signed by Justin Hibbs, MD on 08/31/2022 at 10:24 AM  Clinical History    Pre-op diagnosis: CROHN'S DISEASE  Gross Description    A.  Label: Small intestine (ileostomy trim)-permanent Stoma size: 2.2 x 2.1 cm Surrounding skin: 0.5 cm wide rim of lightly pigmented skin Attached bowel: 1.5 cm in length by 1.7 cm in diameter; unremarkable Other findings: An additional 4.5 x 2.2 x 1.5 cm mucosal fragment is present; unremarkable     Multiple deficiencies-thiamine , potassium iron, vitamin D  detected 10/04/2022 follow-up   April and May 2024 visits-Entyvio  antibody testing negative, decided to treat with Rinvoq .  He is awaiting that.  Calprotectin 275 in April.  C-reactive protein normal.  Thiamine  was low and responded to therapy.  Ferritin 22 B12 and folate normal.  On iron.    Anal fissure -posterior diagnosed June 2024 treated with diltiazem  and lidocaine  and then nifedipine   B12 deficiency diagnosed April 2025 -injections started by Dr. 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 7 days; 6-MP discontinued 2/2 pancreatitis 2020 - Trial of Stelara  (unresponsive) and subsequently Entyvio  with improvement 2022 - Perineal/abdominal pain with active inflammation on CT 10/2021 [facility]) - Ex lap, LOA, mesh removal, ileocolic resection, end ileostomy -managed on Entyvio  postop 02/2022 [facility]) - EC fistula takedown c/b high output ileostomy and FTT requiring TPN 08/2022 ([facility]) -Ileostomy takedown 12/2022 - Induction Rinvoq  15 mg po daily February 2024 increased Rinvoq  to 30 mg daily, prednisone  taper started-CT scanning 09/03/2023 showed  mild stranding of adjacent fat near the small bowel and he was having increasing right lower quadrant pain    -----------------------------------------------------------------------------------------------------------------   Chief Complaint: Follow-up of Crohn's disease   Patient consented to the use of artificial intelligence scribe application  HPI 73 year old man with a history of Crohn's disease and prior surgeries as outlined above, for follow-up last seen 09/25/2023.  He is currently on Rinvoq  30 mg daily.  Perianal dermatitis was treated with Gerhardt's Butt cream after last visit.  In the interim Dr. Alyne Callahan has found a low B12 level and supplementation was started.  He was also to retry cholestyramine  for diarrhea after the last visit.  He continues chronic narcotics for his abdominal pain.  He has been to pelvic floor physical therapy with last visit 11/16/2023.  Also on gabapentin  100 mg at bedtime.   He is seeing Dr. Parke Boll of urology and is on doxycycline  for chronic prostatitis and is going to try pelvic floor physical therapy through the urology group now.  He experiences chronic diarrhea with 5 to 10 liquid bowel movements daily despite cholestyramine  powder and Lomotil . A small intestine resection due to Crohn's disease affects nutrient absorption. He takes Rinvoq  before meals and at bedtime.  He does think cholestyramine  powder helps some.  He is on it once a day though he has been moving around when he takes it.  We reviewed how it needs to be taken 1 hour before and 4 hours after medications to reduce the risk of reduced absorption of his medications.  He now receives weekly B12 injections (2 times so far) due to deficiency, initially suspected because of memory disturbances and levels ordered at the recommendation of neurology. He has noticed slight improvement since starting injections and also takes B12 gummies orally.  He has discontinued Protonix  due to nausea and reports no current heartburn or indigestion, though he experiences significant gas.  He is dairy free.  He did not use Gerhard's Butt cream, he said it was not available.  His perianal complaints are much better he uses his posterior anal fissure treatment with diltiazem  and lidocaine  ointment intermittently.  That is not an active problem now.   He reports stable weight with a slight decrease since April and continues to have issues with appetite.   Wt Readings from Last 3 Encounters:  12/12/23 166 lb 8 oz (75.5 kg)  12/04/23 167 lb (75.8 kg)  11/06/23 168 lb (76.2 kg)   Last CT scan 09/03/2023 with postsurgical changes of the bowel tethering of the bowel loops in the right lower quadrant suggestive of adhesions and mild stranding of the adjacent fat indicative of an inflammatory process.  Mild biliary dilation no stones.  Right upper quadrant ultrasound same day negative.  This was an ED visit on 09/03/2023.  Lab Results   Component Value Date   VITAMINB12 148 (L) 11/06/2023  Thiamine  normal at 22 on this date TSH also normal  Lab Results  Component Value Date   WBC 4.6 09/07/2023   WBC WILL FOLLOW 09/07/2023   WBC 4.8 09/07/2023   HGB 13.5 09/07/2023   HGB WILL FOLLOW 09/07/2023   HGB 13.1 09/07/2023   HCT 41.2 09/07/2023   HCT WILL FOLLOW 09/07/2023   HCT 41.3 09/07/2023   MCV 94.3 09/07/2023   MCV WILL FOLLOW 09/07/2023   MCV 97 09/07/2023   PLT 132.0 (L) 09/07/2023   PLT WILL FOLLOW 09/07/2023   PLT 130 (L) 09/07/2023     Allergies  Allergen Reactions  Purinethol  [Mercaptopurine ] Other (See Comments)    Pancreatitis    Shellfish Allergy Anaphylaxis and Swelling    Can use standard SMOF lipid formulation for TPN without any issue.    Humira  [Adalimumab ] Other (See Comments)    Developed antibodies   Tape Rash   Wound Dressing Adhesive Rash   Current Meds  Medication Sig   acetaminophen  (TYLENOL ) 325 MG tablet Take 650 mg by mouth every 6 (six) hours as needed for moderate pain.   albuterol  (VENTOLIN  HFA) 108 (90 Base) MCG/ACT inhaler Inhale 2 puffs into the lungs every 6 (six) hours as needed for wheezing or shortness of breath.   AMBULATORY NON FORMULARY MEDICATION Medication Name: nifedipine 0.2%/lidocaine  5% ointment 1:1 mix Apply 3-4 tmes/day into anal canal   ascorbic acid (VITAMIN C) 250 MG CHEW Chew by mouth.   busPIRone  (BUSPAR ) 10 MG tablet Take 10 mg by mouth every morning.   calcium  carbonate (TUMS - DOSED IN MG ELEMENTAL CALCIUM ) 500 MG chewable tablet Chew 500 mg by mouth 3 (three) times daily as needed for indigestion or heartburn.   Cholecalciferol (VITAMIN D ) 50 MCG (2000 UT) tablet Take 1 tablet (2,000 Units total) by mouth daily.   cholestyramine  (QUESTRAN ) 4 g packet Take 1 packet (4 g total) by mouth daily with lunch.   diphenoxylate -atropine  (LOMOTIL ) 2.5-0.025 MG tablet Take 2 tablets by mouth 4 (four) times daily as needed for diarrhea or loose stools. TAKE 2  TABLETS BY MOUTH FOUR TIMES DAILY AS NEEDED FOR DIARRHEA OR LOOSE STOOLS   ferrous sulfate  325 (65 FE) MG tablet Take 1 tablet (325 mg total) by mouth daily with breakfast.   finasteride  (PROSCAR ) 5 MG tablet Take 1 tablet (5 mg total) by mouth daily.   gabapentin  (NEURONTIN ) 100 MG capsule Take 1 capsule (100 mg total) by mouth at bedtime.   HYDROcodone -acetaminophen  (NORCO/VICODIN) 5-325 MG tablet Take 1 tablet by mouth every 8 (eight) hours as needed for moderate pain (pain score 4-6).   HYDROcodone -acetaminophen  (NORCO/VICODIN) 5-325 MG tablet Take 1 tablet by mouth every 8 (eight) hours as needed for moderate pain (pain score 4-6).   HYDROcodone -acetaminophen  (NORCO/VICODIN) 5-325 MG tablet Take 1 tablet by mouth every 8 (eight) hours as needed for moderate pain (pain score 4-6).   hyoscyamine  (LEVSIN SL) 0.125 MG SL tablet Place 0.125 mg under the tongue every 6 (six) hours as needed for cramping.   magnesium  oxide (MAG-OX) 400 MG tablet Take 1 tablet (400 mg total) by mouth 2 (two) times daily.   melatonin 3 MG TABS tablet Take 3 mg by mouth at bedtime.   Multiple Vitamin (MULTIVITAMIN WITH MINERALS) TABS tablet Take 1 tablet by mouth daily.   ondansetron  (ZOFRAN -ODT) 4 MG disintegrating tablet Dissolve 1 tablet (4 mg total) by mouth every 6 (six) hours as needed for nausea or vomiting.   oxybutynin  (DITROPAN -XL) 5 MG 24 hr tablet Take 5 mg by mouth daily.   phenazopyridine  (PYRIDIUM ) 200 MG tablet TAKE 1 TABLET(200 MG) BY MOUTH THREE TIMES DAILY AS NEEDED FOR URINARY PAIN   Potassium Chloride  ER 20 MEQ TBCR TAKE 1 TABLET(20 MEQ) BY MOUTH TWICE DAILY   simethicone  (MYLICON) 80 MG chewable tablet Chew 80 mg by mouth 4 (four) times daily -  with meals and at bedtime.   thiamine  (VITAMIN B1) 100 MG tablet Take 1 tablet (100 mg total) by mouth daily.   traZODone  (DESYREL ) 100 MG tablet Take 1 tablet (100 mg total) by mouth at bedtime.   Upadacitinib  ER (RINVOQ )  30 MG TB24 Take 1 tablet (30 mg  total) by mouth daily.   valACYclovir  (VALTREX ) 1000 MG tablet Take one tablet 3 times a day for 7 days as needed for shingles flares   [DISCONTINUED] clotrimazole -betamethasone  (LOTRISONE ) cream Apply 1 Application topically daily.   [DISCONTINUED] pantoprazole  (PROTONIX ) 40 MG tablet TAKE 1 TABLET(40 MG) BY MOUTH TWICE DAILY BEFORE BREAKFAST AND SUPPER   [DISCONTINUED] rifaximin  (XIFAXAN ) 550 MG TABS tablet Take 1 tablet (550 mg total) by mouth 3 (three) times daily.   Past Medical History:  Diagnosis Date   Acute prostatitis 07/24/2007   Qualifier: Diagnosis of  By: Justin Babinski MD, Mara Seminole A    Acute respiratory failure with hypoxia [facility])    Allergy    mild   Arthritis    osteoarthritis   Aspiration pneumonia (HCC) 05/24/2022   Asthma    Blood transfusion without reported diagnosis    BPH (benign prostatic hypertrophy) with urinary obstruction    Crohn's ileitis (HCC) suspected 05/03/2017   Dilated aortic root (HCC)    noted on echo 08/2012   Diverticulitis of colon    Drug-induced acute pancreatitis without infection or necrosis - from 6 MP 09/24/2017   EPIDIDYMITIS 02/15/2010   Qualifier: Diagnosis of  By: Justin Babinski MD, Lenis Quin    GERD (gastroesophageal reflux disease)    H/O: GI bleed    Hemorrhoids    Hepatitis 1975   unknown type    HERPES SIMPLEX INFECTION 10/14/2007   Qualifier: Diagnosis of  By: Justin Babinski MD, Mara Seminole A    Hiatal hernia    Ileus following gastrointestinal surgery (HCC) 12/26/2011   Long term (current) use of systemic steroids 06/18/2018   Psoriasis    sees Dr. Jaclynn Mast at [facility].   Rectal fissure    Recurrent ventral incisional hernia 05/10/2012   Shingles    Short bowel syndrome    SVT (supraventricular tachycardia) (HCC)    Ulcer 08/21/2013   ileal   Past Surgical History:  Procedure Laterality Date   BOWEL RESECTION  12/19/2011   Procedure: SMALL BOWEL RESECTION;  Surgeon: Quitman Bucy, MD;  Location: [facility];  Service: General;  Laterality:  N/A;  with anastamosis and insertion mesh   BRONCHOSCOPY     COLON SURGERY  01/2004   x 2   COLONOSCOPY W/ BIOPSIES  04/26/2017   per Dr. Willy Harvest, no polyps, benign inflammation, repeat in 5 yrs    CYSTOSCOPY     ESOPHAGOGASTRODUODENOSCOPY     HEMICOLECTOMY     left side, at [facility], diverticulitis   HEMORRHOID BANDING     HERNIA REPAIR     [phone] incisional hernia   ILEOSTOMY     ILEOSTOMY CLOSURE     INSERTION OF MESH  07/31/2012   Procedure: INSERTION OF MESH;  Surgeon: Quitman Bucy, MD;  Location: [facility];  Service: General;;   LAPAROTOMY  12/19/2011   Procedure: EXPLORATORY LAPAROTOMY;  Surgeon: Quitman Bucy, MD;  Location: [facility];  Service: General;  Laterality: N/A;   PACEMAKER IMPLANT N/A 12/01/2020   Procedure: PACEMAKER IMPLANT;  Surgeon: Lei Pump, MD;  Location: [facility] INVASIVE CV [facility];  Service: Cardiovascular;  Laterality: N/A;   PACEMAKER INSERTION Left    TONSILLECTOMY     UPPER GASTROINTESTINAL ENDOSCOPY     VASECTOMY     VENTRAL HERNIA REPAIR  07/31/2012   Procedure: HERNIA REPAIR VENTRAL ADULT;  Surgeon: Quitman Bucy, MD;  Location: [facility];  Service: General;  Laterality: N/A;  Social History   Social History Narrative   He is married with 2 sons 1 son is an Acupuncturist and 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   Review of Systems As per HPI  Objective:   Physical Exam BP 92/60   Pulse 60   Ht 5\' 10"  (1.778 m)   Wt 166 lb 8 oz (75.5 kg)   BMI 23.89 kg/m  Thin and chronically ill NAD Lungs  CTA Cor NL s1s2 no rmg Abd thin soft and nontender, BS increased

## 2023-12-13 ENCOUNTER — Ambulatory Visit: Payer: Self-pay | Admitting: Internal Medicine

## 2023-12-18 ENCOUNTER — Ambulatory Visit (INDEPENDENT_AMBULATORY_CARE_PROVIDER_SITE_OTHER): Payer: Medicare Other

## 2023-12-18 DIAGNOSIS — I495 Sick sinus syndrome: Secondary | ICD-10-CM | POA: Diagnosis not present

## 2023-12-18 LAB — CUP PACEART REMOTE DEVICE CHECK
Battery Remaining Longevity: 92 mo
Battery Remaining Percentage: 74 %
Battery Voltage: 2.99 V
Brady Statistic AP VP Percent: 11 %
Brady Statistic AP VS Percent: 66 %
Brady Statistic AS VP Percent: 1 %
Brady Statistic AS VS Percent: 22 %
Brady Statistic RA Percent Paced: 77 %
Brady Statistic RV Percent Paced: 12 %
Date Time Interrogation Session: 20250603034632
Implantable Lead Connection Status: 753985
Implantable Lead Connection Status: 753985
Implantable Lead Implant Date: 20220518
Implantable Lead Implant Date: 20220518
Implantable Lead Location: 753859
Implantable Lead Location: 753860
Implantable Lead Model: 1944
Implantable Pulse Generator Implant Date: 20220518
Lead Channel Impedance Value: 450 Ohm
Lead Channel Impedance Value: 490 Ohm
Lead Channel Pacing Threshold Amplitude: 0.375 V
Lead Channel Pacing Threshold Amplitude: 0.625 V
Lead Channel Pacing Threshold Pulse Width: 0.5 ms
Lead Channel Pacing Threshold Pulse Width: 0.5 ms
Lead Channel Sensing Intrinsic Amplitude: 5 mV
Lead Channel Sensing Intrinsic Amplitude: 5.8 mV
Lead Channel Setting Pacing Amplitude: 0.875
Lead Channel Setting Pacing Amplitude: 1.375
Lead Channel Setting Pacing Pulse Width: 0.5 ms
Lead Channel Setting Sensing Sensitivity: 2 mV
Pulse Gen Model: 2272
Pulse Gen Serial Number: 3925308

## 2023-12-27 ENCOUNTER — Ambulatory Visit: Payer: Self-pay | Admitting: Cardiology

## 2024-01-09 ENCOUNTER — Ambulatory Visit (INDEPENDENT_AMBULATORY_CARE_PROVIDER_SITE_OTHER): Admitting: Family Medicine

## 2024-01-09 ENCOUNTER — Encounter: Payer: Self-pay | Admitting: Family Medicine

## 2024-01-09 VITALS — BP 100/62 | HR 61 | Temp 97.7°F | Wt 163.0 lb

## 2024-01-09 DIAGNOSIS — G8929 Other chronic pain: Secondary | ICD-10-CM | POA: Diagnosis not present

## 2024-01-09 DIAGNOSIS — M6289 Other specified disorders of muscle: Secondary | ICD-10-CM

## 2024-01-09 DIAGNOSIS — K50018 Crohn's disease of small intestine with other complication: Secondary | ICD-10-CM | POA: Diagnosis not present

## 2024-01-09 MED ORDER — HYDROCODONE-ACETAMINOPHEN 10-325 MG PO TABS: 1.0000 | ORAL_TABLET | Freq: Three times a day (TID) | ORAL | 0 refills | Status: DC | PRN

## 2024-01-09 NOTE — Progress Notes (Signed)
   Subjective:    Patient ID: [name], male    DOB: 18-Sep-1950, 73 y.o.   MRN: [id number]  HPI Here for pain management. His pelvic floor pain has gotten a bit worse, and his medication provides only partial relief.    Review of Systems  Constitutional: Negative.   Gastrointestinal:  Positive for abdominal pain.       Objective:   Physical Exam Constitutional:      Appearance: Normal appearance.   Neurological:     Mental Status: He is alert.           Assessment & Plan:  Pain management.  Indication for chronic opioid: pelvic pain Medication and dose: Norco 10-325 # pills per month: 90 Last UDS date: 05-25-23 Opioid Treatment Agreement signed (Y/N): 05-25-23 Opioid Treatment Agreement last reviewed with patient:  01-09-24 [facility] reviewed this encounter (include red flags): Yes We will increase the Norco to 10-325 TID.  Garnette Olmsted, MD

## 2024-01-22 ENCOUNTER — Encounter (HOSPITAL_COMMUNITY): Payer: Self-pay | Admitting: Internal Medicine

## 2024-01-22 ENCOUNTER — Telehealth: Payer: Self-pay

## 2024-01-22 NOTE — Telephone Encounter (Signed)
 Patient has been scheduled for preop clearance and note has been made to appointment line

## 2024-01-22 NOTE — Telephone Encounter (Signed)
 [facility] Pre-operative Risk Assessment     Request for surgical clearance:     Endoscopy Procedure  What type of surgery is being performed?     colonoscopy  When is this surgery scheduled?     01/29/2024 at [facility]  What type of clearance is required ?   Medical Cardiac    Practice name and name of physician performing surgery?      [facility], Dr Lupita Commander  What is your office phone and fax number?      Phone- [phone]  Fax- [phone]  Anesthesia type (None, local, MAC, general) ?       MAC   Please route your response to Knut Rondinelli [place], CMA

## 2024-01-22 NOTE — Telephone Encounter (Signed)
 Patient needs in OFFICE VISIT patient follows up with gen card and EP last seen by EP on August of 2022 per preop APP Damien Braver, NP ok to see APP EP for clearance. I have sent message to EP scheduling team to get patient in for a ov

## 2024-01-22 NOTE — Telephone Encounter (Signed)
 Spoke w/ patient - he is scheduled for preop clearance + overdue follow up w/ EP APP on 7/10.

## 2024-01-22 NOTE — Progress Notes (Signed)
 [name]   PCP-Stephen Johnny  Cardiologist-Camnitz MD Pulmonologist- n/a   EKG-08/07/23 Echo-08/12/19 Cath-n/a Stress-08/24/20 ICD/PM- PM STJ GLP1-n/a Blood Thinner- n/a  HX: Asthma, Asp PNA, BPH, Crohns, SVT, Small Bowel Resection, Pacemaker. Patient last saw his cards MD in 2022, he states that they do quarterly remote checks and have not stated he needs to come in for appt. Last device interrogation was 12/27/23. Patient says no new issues, no cardiac or breathing issues. Did mention that last year when he was emergently intubated they damaged his esophagus so hes now difficult to intubate. Also has had issues getting IV before, they have needed US  in past.  Anesthesia Review- Yes, needs cardiac clearance- GI office notified via epic chat 7/8.

## 2024-01-22 NOTE — Telephone Encounter (Signed)
   Name: [name]  DOB: [birth year]  MRN: [id number]  Primary Cardiologist: None  Chart reviewed as part of pre-operative protocol coverage. Because of [name]'s past medical history and time since last visit, he will require a follow-up in-office visit in order to better assess preoperative cardiovascular risk.  Patient has not been seen since 2022.  Pre-op covering staff: - Please schedule appointment and call patient to inform them. If patient already had an upcoming appointment within acceptable timeframe, please add pre-op clearance to the appointment notes so provider is aware. - Please contact requesting surgeon's office via preferred method (i.e, phone, fax) to inform them of need for appointment prior to surgery.   Damien JAYSON Braver, NP  01/22/2024, 3:47 PM

## 2024-01-23 NOTE — Telephone Encounter (Signed)
 Spoke to Marion and he is aware of his appointment tomorrow.

## 2024-01-23 NOTE — Progress Notes (Unsigned)
  Electrophysiology Office Note:   Date:  01/23/2024  ID:  [name], DOB 31-May-1951, MRN [id number]  Primary Cardiologist: None Primary Heart Failure: None Electrophysiologist: Will Gladis Norton, MD   {Click to update primary MD,subspecialty MD or APP then REFRESH:1}    History of Present Illness:   [name] is a 73 y.o. male with h/o SSS s/p PPM, bifasicular BBB, SVT, dilated aortic root, Crohn's disease, asthma seen today for routine electrophysiology follow up & pre-operative clearance for colonoscopy.   Since last being seen in our clinic the patient reports doing ***.    He denies chest pain, palpitations, dyspnea, PND, orthopnea, nausea, vomiting, dizziness, syncope, edema, weight gain, or early satiety.   Review of systems complete and found to be negative unless listed in HPI.   EP Information / Studies Reviewed:    EKG is ordered today. Personal review as below.      PPM Interrogation-  reviewed in detail today,  See PACEART report.  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  {Refresh Note OR Click here to enter BP  :1}***        Physical Exam:   VS:  There were no vitals taken for this visit.   Wt Readings from Last 3 Encounters:  01/09/24 163 lb (73.9 kg)  12/12/23 166 lb 8 oz (75.5 kg)  12/04/23 167 lb (75.8 kg)     GEN: Well nourished, well developed in no acute distress NECK: No JVD; No carotid bruits CARDIAC: {EPRHYTHM:28826}, no murmurs, rubs, gallops RESPIRATORY:  Clear to auscultation without rales, wheezing or rhonchi  ABDOMEN: Soft, non-tender, non-distended EXTREMITIES:  No edema; No deformity   ASSESSMENT AND PLAN:    SND s/p Abbott PPM  -Normal PPM function -See Pace Art report -No changes today   Pre-Operative Clearance  Request for surgical clearance: Endoscopy Procedure What type of surgery is being performed?      colonoscopy When is this surgery scheduled? 01/29/2024 at [facility] What type of clearance is required ?  Medical Cardiac   Practice name and name of physician performing surgery?  [facility], Dr Lupita Commander Office phone and fax number? Phone- [phone]  Fax- [phone] Anesthesia type (None, local, MAC, general) ?  MAC Please route your response to Patti Swazil[place], CMA  {Click Here to Calculate RCRI      :789639253}  { Click Here to Calculate DASI      :789639253} Mr. Holton's perioperative risk of a major cardiac event is  % according to the Revised Cardiac Risk Index (RCRI).  Therefore, he is at low risk for perioperative complications.     Recommendations: {Does the patient have valve dz (mod or greater and no Echo > 1 year), or clinical change (worsening SOB)?:[id number]} Antiplatelet and/or Anticoagulation Recommendations:  - pt is not on antiplatelet or anticoagulation     Disposition:   Follow up with Dr. Norton {EPFOLLOW LE:71826}  Signed, Daphne Barrack, NP-C, AGACNP-BC [name] - Electrophysiology  01/23/2024, 8:43 PM

## 2024-01-24 ENCOUNTER — Ambulatory Visit: Attending: Pulmonary Disease | Admitting: Pulmonary Disease

## 2024-01-24 ENCOUNTER — Encounter: Payer: Self-pay | Admitting: Pulmonary Disease

## 2024-01-24 VITALS — BP 118/72 | HR 62 | Ht 70.0 in | Wt 167.6 lb

## 2024-01-24 DIAGNOSIS — Z950 Presence of cardiac pacemaker: Secondary | ICD-10-CM | POA: Insufficient documentation

## 2024-01-24 DIAGNOSIS — I495 Sick sinus syndrome: Secondary | ICD-10-CM | POA: Insufficient documentation

## 2024-01-24 NOTE — Patient Instructions (Signed)
 Medication Instructions:  Your physician recommends that you continue on your current medications as directed. Please refer to the Current Medication list given to you today.  *If you need a refill on your cardiac medications before your next appointment, please call your pharmacy*  Lab Work: None ordered If you have labs (blood work) drawn today and your tests are completely normal, you will receive your results only by: MyChart Message (if you have MyChart) OR A paper copy in the mail If you have any lab test that is abnormal or we need to change your treatment, we will call you to review the results.  Follow-Up: At [facility], you and your health needs are our priority.  As part of our continuing mission to provide you with exceptional heart care, our providers are all part of one team.  This team includes your primary Cardiologist (physician) and Advanced Practice Providers or APPs (Physician Assistants and Nurse Practitioners) who all work together to provide you with the care you need, when you need it.  Your next appointment:   1 year(s)  Provider:   You may see Will Cortland Ding, MD or one of the following Advanced Practice Providers on your designated Care Team:   Mertha Abrahams, PA-C Michael Andy Tillery, PA-C Suzann Riddle, NP Creighton Doffing, NP

## 2024-01-25 ENCOUNTER — Encounter: Payer: Self-pay | Admitting: Family Medicine

## 2024-01-25 MED ORDER — VALACYCLOVIR HCL 500 MG PO TABS: 500.0000 mg | ORAL_TABLET | Freq: Three times a day (TID) | ORAL | 5 refills | Status: DC | PRN

## 2024-01-25 NOTE — Telephone Encounter (Signed)
 Yes I sent in for 500 mg tablets instead

## 2024-01-25 NOTE — Telephone Encounter (Signed)
 Got the cardiac clearance and Valere is aware.

## 2024-01-28 ENCOUNTER — Telehealth: Payer: Self-pay

## 2024-01-28 NOTE — Telephone Encounter (Signed)
 Procedure:Colonoscopy Procedure date: 01/29/24 Procedure location: [facility] Arrival Time: - Spoke with the patient Y/N: 7:15 Any prep concerns? N  Has the patient obtained the prep from the pharmacy ? Y Do you have a care partner and transportation: Y Any additional concerns? N

## 2024-01-29 ENCOUNTER — Other Ambulatory Visit: Payer: Self-pay

## 2024-01-29 ENCOUNTER — Ambulatory Visit (HOSPITAL_COMMUNITY)
Admission: RE | Admit: 2024-01-29 | Discharge: 2024-01-29 | Disposition: A | Discharge status: Home / Self Care | Attending: Internal Medicine | Admitting: Internal Medicine

## 2024-01-29 ENCOUNTER — Ambulatory Visit (HOSPITAL_COMMUNITY): Admitting: Anesthesiology

## 2024-01-29 ENCOUNTER — Other Ambulatory Visit (HOSPITAL_COMMUNITY): Payer: Self-pay

## 2024-01-29 ENCOUNTER — Telehealth: Payer: Self-pay

## 2024-01-29 ENCOUNTER — Encounter (HOSPITAL_COMMUNITY): Payer: Self-pay | Admitting: Internal Medicine

## 2024-01-29 ENCOUNTER — Encounter (HOSPITAL_COMMUNITY)
Admission: RE | Disposition: A | Discharge status: Home / Self Care | Payer: Self-pay | Source: Home / Self Care | Attending: Internal Medicine

## 2024-01-29 DIAGNOSIS — Z950 Presence of cardiac pacemaker: Secondary | ICD-10-CM | POA: Diagnosis not present

## 2024-01-29 DIAGNOSIS — E66813 Obesity, class 3: Secondary | ICD-10-CM | POA: Insufficient documentation

## 2024-01-29 DIAGNOSIS — Z980 Intestinal bypass and anastomosis status: Secondary | ICD-10-CM | POA: Diagnosis not present

## 2024-01-29 DIAGNOSIS — G709 Myoneural disorder, unspecified: Secondary | ICD-10-CM | POA: Diagnosis not present

## 2024-01-29 DIAGNOSIS — M1990 Unspecified osteoarthritis, unspecified site: Secondary | ICD-10-CM | POA: Diagnosis not present

## 2024-01-29 DIAGNOSIS — K50012 Crohn's disease of small intestine with intestinal obstruction: Secondary | ICD-10-CM | POA: Insufficient documentation

## 2024-01-29 DIAGNOSIS — Z79899 Other long term (current) drug therapy: Secondary | ICD-10-CM | POA: Insufficient documentation

## 2024-01-29 DIAGNOSIS — J45909 Unspecified asthma, uncomplicated: Secondary | ICD-10-CM | POA: Diagnosis not present

## 2024-01-29 DIAGNOSIS — K219 Gastro-esophageal reflux disease without esophagitis: Secondary | ICD-10-CM | POA: Insufficient documentation

## 2024-01-29 DIAGNOSIS — K529 Noninfective gastroenteritis and colitis, unspecified: Secondary | ICD-10-CM | POA: Insufficient documentation

## 2024-01-29 DIAGNOSIS — Z6823 Body mass index (BMI) 23.0-23.9, adult: Secondary | ICD-10-CM | POA: Insufficient documentation

## 2024-01-29 DIAGNOSIS — K50018 Crohn's disease of small intestine with other complication: Secondary | ICD-10-CM

## 2024-01-29 DIAGNOSIS — K449 Diaphragmatic hernia without obstruction or gangrene: Secondary | ICD-10-CM | POA: Diagnosis not present

## 2024-01-29 DIAGNOSIS — K56699 Other intestinal obstruction unspecified as to partial versus complete obstruction: Secondary | ICD-10-CM

## 2024-01-29 HISTORY — PX: COLONOSCOPY: SHX5424

## 2024-01-29 SURGERY — COLONOSCOPY
Anesthesia: Monitor Anesthesia Care

## 2024-01-29 MED ORDER — SODIUM CHLORIDE 0.9 % IV SOLN: INTRAVENOUS | Status: DC

## 2024-01-29 MED ORDER — PROPOFOL 500 MG/50ML IV EMUL
INTRAVENOUS | Status: DC | PRN
  Administered 2024-01-29: 125 ug/kg/min via INTRAVENOUS

## 2024-01-29 MED ORDER — PROPOFOL 10 MG/ML IV BOLUS
INTRAVENOUS | Status: DC | PRN
  Administered 2024-01-29: 100 mg via INTRAVENOUS

## 2024-01-29 MED ORDER — LIDOCAINE 2% (20 MG/ML) 5 ML SYRINGE
INTRAMUSCULAR | Status: DC | PRN
  Administered 2024-01-29: 100 mg via INTRAVENOUS

## 2024-01-29 NOTE — Transfer of Care (Signed)
 Immediate Anesthesia Transfer of Care Note  Patient: [name]  Procedure(s) Performed: COLONOSCOPY  Patient Location: Endoscopy Unit  Anesthesia Type:MAC combined with regional for post-op pain  Level of Consciousness: drowsy and patient cooperative  Airway & Oxygen Therapy: Patient Spontanous Breathing  Post-op Assessment: Report given to RN and Post -op Vital signs reviewed and stable  Post vital signs: Reviewed and stable  Last Vitals:  Vitals Value Taken Time  BP 108/66 01/29/24 09:48  Temp 36.5 C 01/29/24 09:48  Pulse 60 01/29/24 09:48  Resp 15 01/29/24 09:48  SpO2 95 % 01/29/24 09:48    Last Pain:  Vitals:   01/29/24 0948  TempSrc: Temporal  PainSc: Asleep      Patients Stated Pain Goal: (P) 5 (01/29/24 0737)  Complications: No notable events documented.

## 2024-01-29 NOTE — Op Note (Signed)
 [facility] Patient Name: [name] Procedure Date: 01/29/2024 MRN: [id number] Attending MD: Lupita FORBES Commander , MD, [id number] Date of Birth: 1950/07/18 CSN: [id number] Age: 73 Admit Type: Outpatient Procedure:                Colonoscopy Indications:              Crohn's disease of the small bowel, Follow-up of                            Crohn's disease of the small bowel, Disease                            activity assessment of Crohn's disease of the small                            bowel. Also s/p multiple colon and small bowel                            resections. Providers:                Lupita CHARLENA Commander, MD Referring MD:              Medicines:                Monitored Anesthesia Care Complications:            No immediate complications. Estimated Blood Loss:     Estimated blood loss was minimal. Procedure:                Pre-Anesthesia Assessment:                           - Prior to the procedure, a History and Physical                            was performed, and patient medications and                            allergies were reviewed. The patient's tolerance of                            previous anesthesia was also reviewed. The risks                            and benefits of the procedure and the sedation                            options and risks were discussed with the patient.                            All questions were answered, and informed consent                            was obtained. Prior Anticoagulants: The patient has  taken no anticoagulant or antiplatelet agents. ASA                            Grade Assessment: II - A patient with mild systemic                            disease. After reviewing the risks and benefits,                            the patient was deemed in satisfactory condition to                            undergo the procedure.                           After obtaining informed consent,  the colonoscope                            was passed under direct vision. Throughout the                            procedure, the patient's blood pressure, pulse, and                            oxygen saturations were monitored continuously. The                            CF-HQ190L ([id number]) Olympus colonoscope was                            introduced through the anus and advanced to the the                            ileocolonic anastomosis. The colonoscopy was                            performed without difficulty. The patient tolerated                            the procedure well. The quality of the bowel                            preparation was good. The rectum and Ileocolonic                            anastomsis areas were photographed. Findings:      The perianal and digital rectal examinations were normal.      There was evidence of a prior end-to-side ileo-colonic anastomosis in       the transverse colon. This was patent and was characterized by healthy       appearing mucosa. The anastomosis was traversed. This was biopsied with       a cold forceps for histology. Verification of patient identification for       the specimen was done. Estimated blood loss  was minimal.      An extrinsic mild stenosis was found in the sigmoid colon and was       traversed. Biopsies were taken with a cold forceps for histology.       Verification of patient identification for the specimen was done.       Estimated blood loss was minimal.      The exam was otherwise without abnormality on direct and retroflexion       views.      Biopsies for histology were taken with a cold forceps from the       transverse colon, descending colon and sigmoid colon for evaluation of       microscopic colitis. Impression:               - Patent end-to-side ileo-colonic anastomosis,                            characterized by healthy appearing mucosa.                            Biopsied. Able to inspect  several cm of                            neo-terminal ileum only. Angulation prevented                            further exam.                           - Stricture in the sigmoid colon. Biopsied.                            Extrinsic compression suspected - could feel some                            fixation of colon in this area and there was a                            spastic component to this as would open with                            insufflation and/or lavage. Mild overall - do not                            think causing any obstructive symptoms though ? if                            related to some of his pain problems.                           - The examination was otherwise normal on direct                            and retroflexion views.                           -  Biopsies were taken with a cold forceps from the                            transverse colon, descending colon and sigmoid                            colon for evaluation of microscopic colitis. Moderate Sedation:      Not Applicable - Patient had care per Anesthesia. Recommendation:           - Patient has a contact number available for                            emergencies. The signs and symptoms of potential                            delayed complications were discussed with the                            patient. Return to normal activities tomorrow.                            Written discharge instructions were provided to the                            patient.                           - Resume previous diet.                           - Continue present medications.                           - Await pathology results. Procedure Code(s):        --- Professional ---                           [phone], Colonoscopy, flexible; with biopsy, single                            or multiple Diagnosis Code(s):        --- Professional ---                           Z98.0, Intestinal bypass and anastomosis status                            K50.012, Crohn's disease of small intestine with                            intestinal obstruction CPT copyright 2022 American Medical Association. All rights reserved. The codes documented in this report are preliminary and upon coder review may  be revised to meet current compliance requirements. Lupita FORBES Commander, MD 01/29/2024 10:05:24 AM This report has been signed electronically. Number of Addenda: 0

## 2024-01-29 NOTE — Telephone Encounter (Signed)
 Pharmacy Patient Advocate Encounter  Received notification from [facility] that Prior Authorization for valACYclovir  HCl 500MG  tablets  has been APPROVED from 01/29/24 to 07/16/24. Ran test claim, Copay is $94. This test claim was processed through [facility]- copay amounts may vary at other pharmacies due to pharmacy/plan contracts, or as the patient moves through the different stages of their insurance plan.   PA #/Case ID/Reference #: [id number]

## 2024-01-29 NOTE — Telephone Encounter (Signed)
 Pharmacy Patient Advocate Encounter   Received notification from [employer] that prior authorization for valACYclovir  HCl 500MG  tablets is required/requested.   Insurance verification completed.   The patient is insured through [facility] .   Per test claim: PA required; PA submitted to above mentioned insurance via CoverMyMeds Key/confirmation #/EOC [id number] Status is pending

## 2024-01-29 NOTE — Anesthesia Preprocedure Evaluation (Signed)
 Anesthesia Evaluation  Patient identified by MRN, date of birth, ID band Patient awake    Reviewed: Allergy & Precautions, H&P , NPO status , Patient's Chart, lab work & pertinent test results  History of Anesthesia Complications Negative for: history of anesthetic complications  Airway Mallampati: III  TM Distance: >3 FB Neck ROM: Full    Dental  (+) Teeth Intact, Poor Dentition, Dental Advisory Given   Pulmonary shortness of breath and at rest, asthma    Pulmonary exam normal breath sounds clear to auscultation       Cardiovascular Normal cardiovascular exam(-) dysrhythmias + pacemaker  Rhythm:Regular Rate:Normal     Neuro/Psych ADHD  Neuromuscular disease    GI/Hepatic hiatal hernia,GERD  Medicated,,(+) Hepatitis -, Unspecified  Endo/Other    Class 3 obesity  Renal/GU negative Renal ROS  negative genitourinary   Musculoskeletal  (+) Arthritis , Osteoarthritis,    Abdominal   Peds  Hematology negative hematology ROS (+)   Anesthesia Other Findings   Reproductive/Obstetrics negative OB ROS                              Anesthesia Physical Anesthesia Plan  ASA: II  Anesthesia Plan: MAC   Post-op Pain Management: Minimal or no pain anticipated   Induction: Intravenous  PONV Risk Score and Plan: 1 and Propofol  infusion and Treatment may vary due to age or medical condition  Airway Management Planned: Simple Face Mask  Additional Equipment:   Intra-op Plan:   Post-operative Plan:   Informed Consent: I have reviewed the patients History and Physical, chart, labs and discussed the procedure including the risks, benefits and alternatives for the proposed anesthesia with the patient or authorized representative who has indicated his/her understanding and acceptance.     Dental advisory given  Plan Discussed with: CRNA  Anesthesia Plan Comments:          Anesthesia  Quick Evaluation

## 2024-01-29 NOTE — Anesthesia Postprocedure Evaluation (Signed)
 Anesthesia Post Note  Patient: [name]  Procedure(s) Performed: COLONOSCOPY     Patient location during evaluation: PACU Anesthesia Type: MAC Level of consciousness: awake and alert Pain management: pain level controlled Vital Signs Assessment: post-procedure vital signs reviewed and stable Respiratory status: spontaneous breathing, nonlabored ventilation and respiratory function stable Cardiovascular status: blood pressure returned to baseline and stable Postop Assessment: no apparent nausea or vomiting Anesthetic complications: no   No notable events documented.  Last Vitals:  Vitals:   01/29/24 1000 01/29/24 1010  BP: 133/89 (!) 142/77  Pulse: 60 60  Resp: 19 13  Temp:    SpO2: 97% 96%    Last Pain:  Vitals:   01/29/24 1010  TempSrc:   PainSc: 5                  Butler Levander Pinal

## 2024-01-29 NOTE — H&P (Signed)
 [facility] History and Physical   Primary Care Physician:  [name] LABOR, MD   Reason for Procedure:   Evaluate Crohn's disease  Plan:    colonoscopy     HPI: [name] is a 73 y.o. male w/ a hx of small bowel Crohn's disease. He is improved on Rinvoq  but has persistent abdominal pain and diarrhea problems. For coonoscopy to investigate today.  Gastroenterology problem summary   Crohn's disease of the small intestine-ileitis in a patient status post small bowel resection as below: June 2013 47 cm small bowel resection incarcerated ventral hernia Small intestine, resection - SMALL BOWEL WITH EXTENSIVE ACUTE SEROSITIS AND ASSOCIATED FAT NECROSIS AND ADHESIONS. - NO ATYPIA OR MALIGNANCY. - RESECTION MARGINS VIABLE.   Original Crohn's diagnosis October 2018 colonoscopy, budesonide  therapy then 6-MP plus Humira  early 2019, developed pancreatitis from 6-MP.  Additional budesonide  and then weekly Humira  but antibodies very high 04/05/2018 and loss of efficacy. Prednisone  used as budesonide  was not helpful, Stelara  tried early 2020 and then moved to Entyvio  fall 2020   2022 having problems with perineal and then abdominal pain and diarrhea progressed into 2023 and in March of that year CT showing enteritis/ileitis referred to Dr. Merrilyn and had surgery       November 11, 2021 small bowel resection, two 9 cm long sections A. SMALL BOWEL ANASTOMOSIS, RESECTION:             - Anastomotic site, present.              - Small bowel mucosa with no significant abnormalities.              - Serosa with fibrous adhesions and histiocytic giant cell reaction to mesh material.    B. MESH, REMOVAL:             - Mesh with attached adipose and membranous tissue, gross examination only.    C. SMALL BOWEL ANASTOMOSIS #2, RESECTION:             - Chronic active ileitis with pseudopolyp formation and mucosa ulceration.  - Anastomotic site identified.  - See comment.    D. MESH  #2 , REMOVAL:             - Mesh with attached adipose tissue, gross examination only.    Several admissions due to high output ileostomy and need for hydration and TPN rehab stays, also complicated by abdominal wall abscess requiring drainage.  Was on home TPN.  Dr. Merrilyn did not want to takedown ileostomy. Was staying with his sister in [place] (she could help with TPN) and saw Dr. DerindaGLENWOOD Callahan at [facility]   08/29/2022 preoperative colonoscopy mild diversion colitis noted throughout the colon exam to long Hartmann stump and   08/30/2022 ileostomy reversal at [facility]      Small intestine (ileostomy trim): -Benign skin small intestinal tissue consistent with ostomy site.      Electronically signed by Justin Felisa Gully, MD on 08/31/2022 at 10:24 AM  Clinical History    Pre-op diagnosis: CROHN'S DISEASE  Gross Description    A.  Label: Small intestine (ileostomy trim)-permanent Stoma size: 2.2 x 2.1 cm Surrounding skin: 0.5 cm wide rim of lightly pigmented skin Attached bowel: 1.5 cm in length by 1.7 cm in diameter; unremarkable Other findings: An additional 4.5 x 2.2 x 1.5 cm mucosal fragment is present; unremarkable      Multiple deficiencies-thiamine , potassium iron, vitamin D  detected 10/04/2022 follow-up  April and May 2024 visits-Entyvio  antibody testing negative, decided to treat with Rinvoq .  He is awaiting that.  Calprotectin 275 in April.  C-reactive protein normal.  Thiamine  was low and responded to therapy.  Ferritin 22 B12 and folate normal.  On iron.    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 7 days; 6-MP discontinued 2/2 pancreatitis 2020 - Trial of Stelara  (unresponsive) and subsequently Entyvio  with improvement 2022 - Perineal/abdominal pain with active inflammation on CT 10/2021 [facility]) - Ex lap, LOA, mesh removal, ileocolic resection, end ileostomy -managed on Entyvio  postop 02/2022 [facility]) - EC fistula takedown c/b high output ileostomy and FTT requiring TPN 08/2022 ([facility]) -Ileostomy takedown 12/2022 - Induction Rinvoq  15 mg po daily February 2024 increased Rinvoq  to 30 mg daily, prednisone  taper started-CT scanning 09/03/2023 showed mild stranding of adjacent fat near the small bowel and he was having increasing right lower quadrant pain     Past Medical History:  Diagnosis Date   Acute prostatitis 07/24/2007   Qualifier: Diagnosis of  By: Johnny MD, Callahan A    Acute respiratory failure with hypoxia (HCC)    Allergy    mild   Arthritis    osteoarthritis   Aspiration pneumonia (HCC) 05/24/2022   Asthma    Blood transfusion without reported diagnosis    BPH (benign prostatic hypertrophy) with urinary obstruction    Crohn's ileitis (HCC) suspected 05/03/2017   Dilated aortic root (HCC)    noted on echo 08/2012   Diverticulitis of colon    Drug-induced acute pancreatitis without infection or necrosis - from 6 MP 09/24/2017   EPIDIDYMITIS 02/15/2010   Qualifier: Diagnosis of   By: Johnny MD, Callahan LABOR    GERD (gastroesophageal reflux disease)    H/O: GI bleed    Hemorrhoids    Hepatitis 1975   unknown type    HERPES SIMPLEX INFECTION 10/14/2007   Qualifier: Diagnosis of  By: Johnny MD, Callahan A    Hiatal hernia    Ileus following gastrointestinal surgery (HCC) 12/26/2011   Long term (current) use of systemic steroids 06/18/2018   Psoriasis    sees Dr. Donnice Sims at [facility].   Rectal fissure    Recurrent ventral incisional hernia 05/10/2012   Shingles    Short bowel syndrome    SVT (supraventricular tachycardia) (HCC)    Ulcer 08/21/2013   ileal    Past Surgical History:  Procedure Laterality Date   BOWEL RESECTION  12/19/2011   Procedure: SMALL BOWEL RESECTION;  Surgeon: Morene ONEIDA Olives, MD;  Location: [facility];  Service: General;  Laterality: N/A;  with anastamosis and insertion  mesh   BRONCHOSCOPY     COLON SURGERY  01/2004   x 2   COLONOSCOPY W/ BIOPSIES  04/26/2017   per Dr. Avram, no polyps, benign inflammation, repeat in 5 yrs    CYSTOSCOPY     ESOPHAGOGASTRODUODENOSCOPY     HEMICOLECTOMY     left side, at [facility], diverticulitis   HEMORRHOID BANDING     HERNIA REPAIR     [phone] incisional hernia   ILEOSTOMY     ILEOSTOMY CLOSURE     INSERTION OF MESH  07/31/2012   Procedure: INSERTION OF MESH;  Surgeon: Morene ONEIDA Olives, MD;  Location: [facility];  Service: General;;   LAPAROTOMY  12/19/2011   Procedure: EXPLORATORY LAPAROTOMY;  Surgeon: Morene ONEIDA Olives, MD;  Location: [facility];  Service: General;  Laterality: N/A;   PACEMAKER IMPLANT N/A 12/01/2020   Procedure: PACEMAKER IMPLANT;  Surgeon: Inocencio Soyla Lunger, MD;  Location: [facility];  Service: Cardiovascular;  Laterality: N/A;   PACEMAKER INSERTION Left    TONSILLECTOMY     UPPER GASTROINTESTINAL ENDOSCOPY     VASECTOMY     VENTRAL HERNIA REPAIR  07/31/2012   Procedure: HERNIA REPAIR VENTRAL ADULT;  Surgeon: Morene ONEIDA Olives, MD;  Location: [facility];  Service:  General;  Laterality: N/A;    Prior to Admission medications   Medication Sig Start Date End Date Taking? Authorizing Provider  acetaminophen  (TYLENOL ) 325 MG tablet Take 650 mg by mouth every 6 (six) hours as needed for moderate pain.   Yes [provider]  busPIRone  (BUSPAR ) 10 MG tablet Take 10 mg by mouth every morning.   Yes [provider]  calcium  carbonate (TUMS - DOSED IN MG ELEMENTAL CALCIUM ) 500 MG chewable tablet Chew 500 mg by mouth 3 (three) times daily as needed for indigestion or heartburn.   Yes [provider]  Cholecalciferol (VITAMIN D ) 50 MCG (2000 UT) tablet Take 1 tablet (2,000 Units total) by mouth daily. 01/02/23  Yes Avram Lupita BRAVO, MD  cholestyramine  (QUESTRAN ) 4 g packet Take 1 packet (4 g total) by mouth daily with lunch. 09/25/23  Yes Avram Lupita BRAVO, MD  ferrous sulfate  325 (65 FE) MG tablet Take 1 tablet (325 mg total) by mouth daily with breakfast. 10/10/22  Yes Avram Lupita BRAVO, MD  finasteride  (PROSCAR ) 5 MG tablet Take 1 tablet (5 mg total) by mouth daily. 08/21/23  Yes [name] LABOR, MD  gabapentin  (NEURONTIN ) 100 MG capsule Take 1 capsule (100 mg total) by mouth at bedtime. 08/13/23  Yes [name] LABOR, MD  HYDROcodone -acetaminophen  (NORCO) 10-325 MG tablet Take 1 tablet by mouth every 8 (eight) hours as needed for moderate pain (pain score 4-6). 01/09/24  Yes [name] LABOR, MD  HYDROcodone -acetaminophen  (NORCO) 10-325 MG tablet Take 1 tablet by mouth every 8 (eight) hours as needed for moderate pain (pain score 4-6). 01/09/24  Yes [name] LABOR, MD  HYDROcodone -acetaminophen  (NORCO) 10-325 MG tablet Take 1 tablet by mouth every 8 (eight) hours as needed for moderate pain (pain score 4-6). 01/09/24  Yes [name] LABOR, MD  hyoscyamine  (LEVSIN  SL) 0.125 MG SL tablet Place 0.125 mg under the tongue every 6 (six) hours as needed for cramping.   Yes [provider]  magnesium  oxide (MAG-OX) 400 MG tablet Take 1 tablet (400 mg total) by  mouth 2 (two) times daily. 02/18/23  Yes Avram Lupita BRAVO, MD  Multiple Vitamin (MULTIVITAMIN WITH MINERALS) TABS tablet Take 1 tablet by mouth daily.  Yes [provider]  ondansetron  (ZOFRAN -ODT) 4 MG disintegrating tablet Dissolve 1 tablet (4 mg total) by mouth every 6 (six) hours as needed for nausea or vomiting. 06/22/22  Yes Gonfa, Taye T, MD  oxybutynin  (DITROPAN -XL) 5 MG 24 hr tablet Take 5 mg by mouth daily. 09/19/23  Yes [provider]  phenazopyridine  (PYRIDIUM ) 200 MG tablet TAKE 1 TABLET(200 MG) BY MOUTH THREE TIMES DAILY AS NEEDED FOR URINARY PAIN 08/14/23  Yes [name] LABOR, MD  Potassium Chloride  ER 20 MEQ TBCR TAKE 1 TABLET(20 MEQ) BY MOUTH TWICE DAILY 05/31/23  Yes Avram Lupita BRAVO, MD  simethicone  (MYLICON) 80 MG chewable tablet Chew 80 mg by mouth 4 (four) times daily -  with meals and at bedtime.   Yes [provider]  thiamine  (VITAMIN B1) 100 MG tablet Take 1 tablet (100 mg total) by mouth daily. 10/10/22  Yes Avram Lupita BRAVO, MD  traZODone  (DESYREL ) 100 MG tablet Take 1 tablet (100 mg total) by mouth at bedtime. 08/13/23  Yes [name] LABOR, MD  Upadacitinib  ER (RINVOQ ) 30 MG TB24 Take 1 tablet (30 mg total) by mouth daily. 09/07/23  Yes Avram Lupita BRAVO, MD  valACYclovir  (VALTREX ) 500 MG tablet Take 1 tablet (500 mg total) by mouth 3 (three) times daily as needed (for shingles). 01/25/24  Yes [name] LABOR, MD  albuterol  (VENTOLIN  HFA) 108 (90 Base) MCG/ACT inhaler Inhale 2 puffs into the lungs every 6 (six) hours as needed for wheezing or shortness of breath. 06/22/22   Gonfa, Taye T, MD  AMBULATORY NON FORMULARY MEDICATION Medication Name: nifedipine 0.2%/lidocaine  5% ointment 1:1 mix Apply 3-4 tmes/day into anal canal 01/23/23   Avram Lupita BRAVO, MD  ascorbic acid (VITAMIN C) 250 MG CHEW Chew by mouth.    [provider]  diphenoxylate -atropine  (LOMOTIL ) 2.5-0.025 MG tablet Take 2 tablets by mouth 4 (four) times daily as needed for diarrhea or loose  stools. TAKE 2 TABLETS BY MOUTH FOUR TIMES DAILY AS NEEDED FOR DIARRHEA OR LOOSE STOOLS 11/21/23   Avram Lupita BRAVO, MD  melatonin 3 MG TABS tablet Take 3 mg by mouth at bedtime.    [provider]    Current Facility-Administered Medications  Medication Dose Route Frequency Provider Last Rate Last Admin   0.9 %  sodium chloride  infusion   Intravenous Continuous Avram Lupita BRAVO, MD        Allergies as of 12/12/2023 - Review Complete 12/12/2023  Allergen Reaction Noted   Purinethol  [mercaptopurine ] Other (See Comments) 10/08/2017   Shellfish allergy Anaphylaxis and Swelling 12/12/2011   Humira  [adalimumab ] Other (See Comments) 03/20/2019   Tape Rash    Wound dressing adhesive Rash 12/30/2020    Family History  Problem Relation Age of Onset   Lung cancer Mother    Leukemia Father    Hypertension Father    Heart disease Father    Heart attack Father    Prostate cancer Father    Prostate cancer Paternal Uncle    Colon cancer Neg Hx    Stomach cancer Neg Hx    Colon polyps Neg Hx    Esophageal cancer Neg Hx    Rectal cancer Neg Hx     Social History   Socioeconomic History   Marital status: Married    Spouse name: Not on file   Number of children: 2   Years of education: Not on file   Highest education level: Master's degree (e.g., MA, MS, MEng, MEd, MSW, MBA)  Occupational History  Occupation: EHS manager-retired    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  Tobacco Use   Smoking status: Never   Smokeless tobacco: Never  Vaping Use   Vaping status: Never Used  Substance and Sexual Activity   Alcohol use: Not Currently    Comment: occ   Drug use: Yes    Types: Oxycodone    Sexual activity: Not on file  Other Topics Concern   Not on file  Social History Narrative   He is married with 2 sons 1 son is an Acupuncturist and the 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 and 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   He is Horticulturist, 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   Rare if any caffeine   Rare alcohol and never smoker   Social Drivers of Corporate investment banker Strain: Low Risk  (11/05/2023)   Overall Financial Resource Strain (CARDIA)    Difficulty of Paying Living Expenses: Not hard at all  Food Insecurity: No Food Insecurity (11/05/2023)   Hunger Vital Sign    Worried About Running Out of Food in the Last Year: Never true    Ran Out of Food in the Last Year: Never true  Transportation Needs: No Transportation Needs (11/05/2023)   PRAPARE - Administrator, Civil Service (Medical): No    Lack of Transportation (Non-Medical): No  Physical Activity: Insufficiently Active (11/05/2023)   Exercise Vital Sign    Days of Exercise per Week: 1 day    Minutes of Exercise per Session: 10 min  Stress: Stress Concern Present (11/05/2023)   [employer] of Occupational Health - Occupational Stress Questionnaire    Feeling of Stress : To some extent  Social Connections: Socially Integrated (11/05/2023)   Social Connection and Isolation Panel    Frequency of Communication with Friends and Family: Twice a week    Frequency of Social Gatherings with Friends and Family: Once a week    Attends Religious Services: 1 to 4 times per year    Active Member of [employer] or Organizations: Yes    Attends Banker Meetings: 1 to 4 times per year    Marital Status: Married  Catering manager Violence: Not At Risk (08/31/2022)   Received from [facility]   Humiliation, Afraid, Rape, and Kick questionnaire    Within the last year, have you been afraid of your partner or ex-partner?: No    Within the last year, have you been humiliated or emotionally abused in other ways by your partner or ex-partner?: No    Within the last year, have you been kicked, hit, slapped, or otherwise physically hurt by your partner or ex-partner?: No    Within the last year, have you been raped or forced to have any kind  of sexual activity by your partner or ex-partner?: No    Review of Systems:  All other review of systems negative except as mentioned in the HPI.  Physical Exam: Vital signs BP (P) 138/75   Temp (!) (P) 97.2 F (36.2 C) (Temporal)   Resp (P) 12   Ht (P) 5' 10 (1.778 m)   Wt (P) 75.8 kg   SpO2 (P) 100%   BMI (P) 23.96 kg/m   General:   Alert,  Well-developed, well-nourished, pleasant and cooperative in NAD Lungs:  Clear throughout to auscultation.   Heart:  Regular rate and rhythm; no murmurs, clicks, rubs,  or gallops. Abdomen:  Soft, nontender and nondistended. Normal bowel sounds.   Neuro/Psych:  Alert and cooperative. Normal mood and affect. A and O x 3   @Joshua Soulier  CHARLENA Commander, MD, [facility] [phone] (pager) 01/29/2024 7:57 AM@

## 2024-01-29 NOTE — Discharge Instructions (Addendum)
 Overall I did not see any active Crohn's disease.  I could not get very far into the small bowel but what I saw all was normal.  I took biopsies of the colon and the small bowel.  There is a narrowing in the colon in the lower part, the sigmoid colon.  I think it is from extrinsic compression.  This was checked with biopsies also.  I do not know if this is causing you problems, it could be related to adhesions I suppose.  I will let you know pathology results and any next steps after they are reviewed.  I appreciate the opportunity to care for you. Justin CHARLENA Commander, MD, FACG  YOU HAD AN ENDOSCOPIC PROCEDURE TODAY: Refer to the procedure report and other information in the discharge instructions given to you for any specific questions about what was found during the examination. If this information does not answer your questions, please call Dr. Darilyn office at [phone] to clarify.   YOU SHOULD EXPECT: Some feelings of bloating in the abdomen. Passage of more gas than usual. Walking can help get rid of the air that was put into your GI tract during the procedure and reduce the bloating. If you had a lower endoscopy (such as a colonoscopy or flexible sigmoidoscopy) you may notice spotting of blood in your stool or on the toilet paper. Some abdominal soreness may be present for a day or two, also.  DIET: Your first meal following the procedure should be a light meal and then it is ok to progress to your normal diet. A half-sandwich or bowl of soup is an example of a good first meal. Heavy or fried foods are harder to digest and may make you feel nauseous or bloated. Drink plenty of fluids but you should avoid alcoholic beverages for 24 hours.   ACTIVITY: Your care partner should take you home directly after the procedure. You should plan to take it easy, moving slowly for the rest of the day. You can resume normal activity the day after the procedure however YOU SHOULD NOT DRIVE, use power tools,  machinery or perform tasks that involve climbing or major physical exertion for 24 hours (because of the sedation medicines used during the test).   SYMPTOMS TO REPORT IMMEDIATELY: A gastroenterologist can be reached at any hour. Please call [phone]  for any of the following symptoms:  Following lower endoscopy (colonoscopy, flexible sigmoidoscopy) Excessive amounts of blood in the stool  Significant tenderness, worsening of abdominal pains  Swelling of the abdomen that is new, acute  Fever of 100 or higher    FOLLOW UP:  If any biopsies were taken you will be contacted by phone or by letter within the next 1-3 weeks. Call [phone]  if you have not heard about the biopsies in 3 weeks.  Please also call with any specific questions about appointments or follow up tests.

## 2024-01-30 ENCOUNTER — Ambulatory Visit: Payer: Self-pay

## 2024-01-30 LAB — SURGICAL PATHOLOGY

## 2024-01-30 NOTE — Telephone Encounter (Signed)
 Pt requesting appt to switch certain medication under PCP ordering.  Copied from CRM [id number]. Topic: Clinical - Red Word Triage >> Jan 30, 2024 11:56 AM Deleta RAMAN wrote: Red Word that prompted transfer to Nurse Triage: Patient is calling to schedule an appointment regarding medication with Pcp he would like a virtual visit. While completing decision tree he mentioned pain in abdomen.

## 2024-01-30 NOTE — Telephone Encounter (Signed)
 Appt scheduled on 7/17 at 9:30am.

## 2024-01-31 ENCOUNTER — Other Ambulatory Visit: Payer: Self-pay

## 2024-01-31 ENCOUNTER — Ambulatory Visit (INDEPENDENT_AMBULATORY_CARE_PROVIDER_SITE_OTHER): Admitting: Family Medicine

## 2024-01-31 ENCOUNTER — Encounter (HOSPITAL_COMMUNITY): Payer: Self-pay | Admitting: Internal Medicine

## 2024-01-31 VITALS — BP 100/62 | HR 63 | Temp 97.9°F | Wt 164.2 lb

## 2024-01-31 DIAGNOSIS — F419 Anxiety disorder, unspecified: Secondary | ICD-10-CM | POA: Insufficient documentation

## 2024-01-31 DIAGNOSIS — G4700 Insomnia, unspecified: Secondary | ICD-10-CM | POA: Diagnosis not present

## 2024-01-31 MED ORDER — TRAZODONE HCL 100 MG PO TABS: 200.0000 mg | ORAL_TABLET | Freq: Every day | ORAL | 3 refills | Status: DC

## 2024-01-31 MED ORDER — BUSPIRONE HCL 10 MG PO TABS: 10.0000 mg | ORAL_TABLET | Freq: Every morning | ORAL | 3 refills | Status: DC

## 2024-01-31 NOTE — Progress Notes (Signed)
   Subjective:    Patient ID: [name], male    DOB: 02-17-1951, 73 y.o.   MRN: [id number]  HPI Here to discuss his anxiety and insomnia. He has been seeing Ronal Ewings PA at [facility] for these issues, and he has been doing very well. He asks us  to take over managing these issues. His moods have been stable and he sleeps well.    Review of Systems  Constitutional: Negative.   Respiratory: Negative.    Cardiovascular: Negative.   Psychiatric/Behavioral: Negative.         Objective:   Physical Exam Constitutional:      Appearance: Normal appearance.  Cardiovascular:     Rate and Rhythm: Normal rate and regular rhythm.     Pulses: Normal pulses.     Heart sounds: Normal heart sounds.  Pulmonary:     Effort: Pulmonary effort is normal.     Breath sounds: Normal breath sounds.  Neurological:     Mental Status: He is alert.  Psychiatric:        Mood and Affect: Mood normal.        Behavior: Behavior normal.        Thought Content: Thought content normal.           Assessment & Plan:  His anxiety and insomnia are well controlled. We refilled the Trazodone  and the Buspirone .  Garnette Olmsted, MD

## 2024-02-03 ENCOUNTER — Ambulatory Visit: Payer: Self-pay | Admitting: Internal Medicine

## 2024-02-04 ENCOUNTER — Other Ambulatory Visit: Payer: Self-pay

## 2024-02-04 DIAGNOSIS — K50018 Crohn's disease of small intestine with other complication: Secondary | ICD-10-CM

## 2024-02-07 ENCOUNTER — Other Ambulatory Visit

## 2024-02-11 ENCOUNTER — Encounter: Payer: Self-pay | Admitting: Family Medicine

## 2024-02-11 ENCOUNTER — Ambulatory Visit

## 2024-02-11 DIAGNOSIS — K50018 Crohn's disease of small intestine with other complication: Secondary | ICD-10-CM

## 2024-02-13 LAB — CALPROTECTIN, FECAL: Calprotectin, Fecal: 111 ug/g (ref 0–120)

## 2024-02-13 NOTE — Progress Notes (Signed)
 Remote pacemaker transmission.

## 2024-02-13 NOTE — Telephone Encounter (Signed)
 Pt notified that Rx refill is at the pharmacy, voiced uderstanding

## 2024-02-14 ENCOUNTER — Ambulatory Visit: Payer: Self-pay | Admitting: Internal Medicine

## 2024-02-18 NOTE — Telephone Encounter (Signed)
 Pt states that he will be running out of the pain medication since he is taking 2 tablets Hydrocodone  5/325 mg to equal 10/325 mg since his pharmacy filled the wrong Rx. Pharmacy will need approval for them to fill the 10/325 mg. Please advise

## 2024-02-19 NOTE — Telephone Encounter (Signed)
 He is correct. Please call the pharmacy and tell them to fill the 02-08-24 RX for the 10 mg Norco, and they need to clear the 5 mg out of their system

## 2024-02-19 NOTE — Telephone Encounter (Signed)
 Spoke with pt pharmacy advised of Dr Johnny message to d/c the Hydrocodone  5/325 mg and to fill the 10/325 mg sent on 02/08/24.Pt notified

## 2024-02-21 ENCOUNTER — Other Ambulatory Visit: Payer: Self-pay

## 2024-02-21 ENCOUNTER — Emergency Department (HOSPITAL_BASED_OUTPATIENT_CLINIC_OR_DEPARTMENT_OTHER)
Admission: EM | Admit: 2024-02-21 | Discharge: 2024-02-21 | Disposition: A | Discharge status: Home / Self Care | Attending: Emergency Medicine | Admitting: Emergency Medicine

## 2024-02-21 ENCOUNTER — Emergency Department (HOSPITAL_BASED_OUTPATIENT_CLINIC_OR_DEPARTMENT_OTHER)

## 2024-02-21 ENCOUNTER — Encounter (HOSPITAL_BASED_OUTPATIENT_CLINIC_OR_DEPARTMENT_OTHER): Payer: Self-pay | Admitting: Emergency Medicine

## 2024-02-21 DIAGNOSIS — N132 Hydronephrosis with renal and ureteral calculous obstruction: Secondary | ICD-10-CM | POA: Insufficient documentation

## 2024-02-21 DIAGNOSIS — J45909 Unspecified asthma, uncomplicated: Secondary | ICD-10-CM | POA: Insufficient documentation

## 2024-02-21 DIAGNOSIS — Z950 Presence of cardiac pacemaker: Secondary | ICD-10-CM | POA: Insufficient documentation

## 2024-02-21 DIAGNOSIS — N201 Calculus of ureter: Secondary | ICD-10-CM

## 2024-02-21 DIAGNOSIS — R109 Unspecified abdominal pain: Secondary | ICD-10-CM

## 2024-02-21 DIAGNOSIS — R1032 Left lower quadrant pain: Secondary | ICD-10-CM | POA: Diagnosis present

## 2024-02-21 LAB — COMPREHENSIVE METABOLIC PANEL WITH GFR
ALT: 18 U/L (ref 0–44)
AST: 31 U/L (ref 15–41)
Albumin: 4.8 g/dL (ref 3.5–5.0)
Alkaline Phosphatase: 68 U/L (ref 38–126)
Anion gap: 11 (ref 5–15)
BUN: 12 mg/dL (ref 8–23)
CO2: 26 mmol/L (ref 22–32)
Calcium: 10.4 mg/dL — ABNORMAL HIGH (ref 8.9–10.3)
Chloride: 102 mmol/L (ref 98–111)
Creatinine, Ser: 1.34 mg/dL — ABNORMAL HIGH (ref 0.61–1.24)
GFR, Estimated: 57 mL/min — ABNORMAL LOW (ref >60)
Glucose, Bld: 114 mg/dL — ABNORMAL HIGH (ref 70–99)
Potassium: 4.5 mmol/L (ref 3.5–5.1)
Sodium: 139 mmol/L (ref 135–145)
Total Bilirubin: 0.6 mg/dL (ref 0.0–1.2)
Total Protein: 7.3 g/dL (ref 6.5–8.1)

## 2024-02-21 LAB — URINALYSIS, ROUTINE W REFLEX MICROSCOPIC
Bacteria, UA: NONE SEEN
Bilirubin Urine: NEGATIVE
Glucose, UA: NEGATIVE mg/dL
Ketones, ur: NEGATIVE mg/dL
Leukocytes,Ua: NEGATIVE
Nitrite: NEGATIVE
Protein, ur: NEGATIVE mg/dL
RBC / HPF: >50 RBC/hpf (ref 0–5)
Specific Gravity, Urine: 1.045 — ABNORMAL HIGH (ref 1.005–1.030)
pH: 6 (ref 5.0–8.0)

## 2024-02-21 LAB — CBC
HCT: 39.1 % (ref 39.0–52.0)
Hemoglobin: 13.1 g/dL (ref 13.0–17.0)
MCH: 31.5 pg (ref 26.0–34.0)
MCHC: 33.5 g/dL (ref 30.0–36.0)
MCV: 94 fL (ref 80.0–100.0)
Platelets: 171 K/uL (ref 150–400)
RBC: 4.16 MIL/uL — ABNORMAL LOW (ref 4.22–5.81)
RDW: 14.1 % (ref 11.5–15.5)
WBC: 5.6 K/uL (ref 4.0–10.5)
nRBC: 0 % (ref 0.0–0.2)

## 2024-02-21 LAB — LIPASE, BLOOD: Lipase: 39 U/L (ref 11–51)

## 2024-02-21 MED ORDER — MORPHINE SULFATE (PF) 4 MG/ML IV SOLN
4.0000 mg | Freq: Once | INTRAVENOUS | Status: AC
  Administered 2024-02-21: 4 mg via INTRAVENOUS
  Filled 2024-02-21: qty 1

## 2024-02-21 MED ORDER — OXYCODONE HCL 5 MG PO TABS: 5.0000 mg | ORAL_TABLET | ORAL | 0 refills | Status: DC | PRN

## 2024-02-21 MED ORDER — SODIUM CHLORIDE 0.9 % IV BOLUS
1000.0000 mL | Freq: Once | INTRAVENOUS | Status: AC
  Administered 2024-02-21: 1000 mL via INTRAVENOUS

## 2024-02-21 MED ORDER — IOHEXOL 300 MG/ML  SOLN
100.0000 mL | Freq: Once | INTRAMUSCULAR | Status: AC | PRN
  Administered 2024-02-21: 100 mL via INTRAVENOUS

## 2024-02-21 MED ORDER — ONDANSETRON 4 MG PO TBDP: 4.0000 mg | ORAL_TABLET | Freq: Three times a day (TID) | ORAL | 0 refills | Status: DC | PRN

## 2024-02-21 MED ORDER — TAMSULOSIN HCL 0.4 MG PO CAPS: 0.4000 mg | ORAL_CAPSULE | Freq: Every day | ORAL | 0 refills | Status: AC

## 2024-02-21 MED ORDER — ONDANSETRON HCL 4 MG/2ML IJ SOLN
4.0000 mg | Freq: Once | INTRAMUSCULAR | Status: AC
  Administered 2024-02-21: 4 mg via INTRAVENOUS
  Filled 2024-02-21: qty 2

## 2024-02-21 NOTE — ED Provider Notes (Signed)
  Physical Exam  BP (!) 161/77   Pulse 64   Ht 5' 10 (1.778 m)   Wt 74.4 kg   SpO2 100%   BMI 23.53 kg/m   Physical Exam  Procedures  Procedures  ED Course / MDM    Medical Decision Making Amount and/or Complexity of Data Reviewed Labs: ordered. Radiology: ordered.  Risk Prescription drug management.  Received care of patient from Dr. Theadore.  Please see his note for prior history, physical and care. 72 year old male with a history of multiple abdominal surgeries who presents concern for abdominal pain.  CT abdomen pelvis is pending.  Labs completed and personally evaluated interpreted by me showed no evidence of pancreatitis, no transaminitis, creatinine of 1.34, no leukocytosis.  UA without infection.  CT abdomen pelvis was completed and shows a 2-58mm distal left ureteral stone with mild left hydroureteronephrosis. Is on 10-325mg  norco, still having pain, discussed can add oxycodone  for breakthrough if he has taken the 10-325 and is still having severe pain.  Given rx for flomax .  Recommend follow up with Urology, Dr. Carolee.        Dreama Longs, MD 02/21/24 [phone]

## 2024-02-21 NOTE — ED Provider Notes (Signed)
 [facility] Emergency Department Provider Note MRN:  [id number]  Arrival date & time: 02/21/24     Chief Complaint   Abdominal Pain   History of Present Illness   [name] is a 73 y.o. year-old male with a history of multiple abdominal surgery presenting to the ED with chief complaint of abdominal pain.  Persistent left lower quadrant abdominal pain for the past 2 or 3 days.  Associated with nausea.  No vomiting.  No fever.  Decreased bowel movements over the past 24 hours.  Review of Systems  A thorough review of systems was obtained and all systems are negative except as noted in the HPI and PMH.   Patient's Health History    Past Medical History:  Diagnosis Date   Acute prostatitis 07/24/2007   Qualifier: Diagnosis of  By: Johnny MD, Garnette A    Acute respiratory failure with hypoxia (HCC)    Allergy    mild   Arthritis    osteoarthritis   Aspiration pneumonia (HCC) 05/24/2022   Asthma    Blood transfusion without reported diagnosis    BPH (benign prostatic hypertrophy) with urinary obstruction    Crohn's ileitis (HCC) suspected 05/03/2017   Dilated aortic root (HCC)    noted on echo 08/2012   Diverticulitis of colon    Drug-induced acute pancreatitis without infection or necrosis - from 6 MP 09/24/2017   EPIDIDYMITIS 02/15/2010   Qualifier: Diagnosis of  By: Johnny MD, Garnette LABOR    GERD (gastroesophageal reflux disease)    H/O: GI bleed    Hemorrhoids    Hepatitis 1975   unknown type    HERPES SIMPLEX INFECTION 10/14/2007   Qualifier: Diagnosis of  By: Johnny MD, Garnette A    Hiatal hernia    Ileus following gastrointestinal surgery (HCC) 12/26/2011   Long term (current) use of systemic steroids 06/18/2018   Psoriasis    sees Dr. Donnice Sims at [facility].   Rectal fissure    Recurrent ventral incisional hernia 05/10/2012   Shingles    Short bowel syndrome    SVT (supraventricular tachycardia) (HCC)    Ulcer 08/21/2013   ileal     Past Surgical History:  Procedure Laterality Date   BOWEL RESECTION  12/19/2011   Procedure: SMALL BOWEL RESECTION;  Surgeon: Morene ONEIDA Olives, MD;  Location: [facility];  Service: General;  Laterality: N/A;  with anastamosis and insertion mesh   BRONCHOSCOPY     COLON SURGERY  01/2004   x 2   COLONOSCOPY N/A 01/29/2024   Procedure: COLONOSCOPY;  Surgeon: Avram Lupita BRAVO, MD;  Location: [facility];  Service: Gastroenterology;  Laterality: N/A;   COLONOSCOPY W/ BIOPSIES  04/26/2017   per Dr. Avram, no polyps, benign inflammation, repeat in 5 yrs    CYSTOSCOPY     ESOPHAGOGASTRODUODENOSCOPY     HEMICOLECTOMY     left side, at [facility], diverticulitis   HEMORRHOID BANDING     HERNIA REPAIR     [phone] incisional hernia   ILEOSTOMY     ILEOSTOMY CLOSURE     INSERTION OF MESH  07/31/2012   Procedure: INSERTION OF MESH;  Surgeon: Morene ONEIDA Olives, MD;  Location: [facility];  Service: General;;   LAPAROTOMY  12/19/2011   Procedure: EXPLORATORY LAPAROTOMY;  Surgeon: Morene ONEIDA Olives, MD;  Location: [facility];  Service: General;  Laterality: N/A;   PACEMAKER IMPLANT N/A 12/01/2020   Procedure: PACEMAKER IMPLANT;  Surgeon: Inocencio Soyla Lunger, MD;  Location:  [facility];  Service: Cardiovascular;  Laterality: N/A;   PACEMAKER INSERTION Left    TONSILLECTOMY     UPPER GASTROINTESTINAL ENDOSCOPY     VASECTOMY     VENTRAL HERNIA REPAIR  07/31/2012   Procedure: HERNIA REPAIR VENTRAL ADULT;  Surgeon: Morene ONEIDA Olives, MD;  Location: [facility];  Service: General;  Laterality: N/A;    Family History  Problem Relation Age of Onset   Lung cancer Mother    Leukemia Father    Hypertension Father    Heart disease Father    Heart attack Father    Prostate cancer Father    Prostate cancer Paternal Uncle    Colon cancer Neg Hx    Stomach cancer Neg Hx    Colon polyps Neg Hx    Esophageal cancer Neg Hx    Rectal cancer Neg Hx     Social History   Socioeconomic History   Marital  status: Married    Spouse name: Not on file   Number of children: 2   Years of education: Not on file   Highest education level: Master's degree (e.g., MA, MS, MEng, MEd, MSW, MBA)  Occupational History   Occupation: EHS manager-retired    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  Tobacco Use   Smoking status: Never   Smokeless tobacco: Never  Vaping Use   Vaping status: Never Used  Substance and Sexual Activity   Alcohol use: Not Currently    Comment: occ   Drug use: Yes    Types: Oxycodone    Sexual activity: Not on file  Other Topics Concern   Not on file  Social History Narrative   He is married with 2 sons 1 son is an Acupuncturist and the 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 and 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   He is Horticulturist, 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   Rare if any caffeine   Rare alcohol and never smoker   Social Drivers of Corporate investment banker Strain: Low Risk  (11/05/2023)   Overall Financial Resource Strain (CARDIA)    Difficulty of Paying Living Expenses: Not hard at all  Food Insecurity: No Food Insecurity (11/05/2023)   Hunger Vital Sign    Worried About Running Out of Food in the Last Year: Never true    Ran Out of Food in the Last Year: Never true  Transportation Needs: No Transportation Needs (11/05/2023)   PRAPARE - Administrator, Civil Service (Medical): No    Lack of Transportation (Non-Medical): No  Physical Activity: Insufficiently Active (11/05/2023)   Exercise Vital Sign    Days of Exercise per Week: 1 day    Minutes of Exercise per Session: 10 min  Stress: Stress Concern Present (11/05/2023)   [employer] of Occupational Health - Occupational Stress Questionnaire    Feeling of Stress : To some extent  Social Connections: Socially Integrated (11/05/2023)   Social Connection and Isolation Panel    Frequency of Communication with Friends and Family: Twice a week     Frequency of Social Gatherings with Friends and Family: Once a week    Attends Religious Services: 1 to 4 times per year    Active Member of [employer] or Organizations: Yes    Attends Banker Meetings: 1 to 4 times per year    Marital Status: Married  Catering manager Violence: Not At Risk (08/31/2022)  Received from [facility]   Humiliation, Afraid, Rape, and Kick questionnaire    Within the last year, have you been afraid of your partner or ex-partner?: No    Within the last year, have you been humiliated or emotionally abused in other ways by your partner or ex-partner?: No    Within the last year, have you been kicked, hit, slapped, or otherwise physically hurt by your partner or ex-partner?: No    Within the last year, have you been raped or forced to have any kind of sexual activity by your partner or ex-partner?: No     Physical Exam  There were no vitals filed for this visit.  CONSTITUTIONAL: Well-appearing, NAD NEURO/PSYCH:  Alert and oriented x 3, no focal deficits EYES:  eyes equal and reactive ENT/NECK:  no LAD, no JVD CARDIO: Regular rate, well-perfused, normal S1 and S2 PULM:  CTAB no wheezing or rhonchi GI/GU:  non-distended, non-tender MSK/SPINE:  No gross deformities, no edema SKIN:  no rash, atraumatic   *Additional and/or pertinent findings included in MDM below  Diagnostic and Interventional Summary    EKG Interpretation Date/Time:    Ventricular Rate:    PR Interval:    QRS Duration:    QT Interval:    QTC Calculation:   R Axis:      Text Interpretation:         Labs Reviewed  CBC - Abnormal; Notable for the following components:      Result Value   RBC 4.16 (*)    All other components within normal limits  LIPASE, BLOOD  COMPREHENSIVE METABOLIC PANEL WITH GFR  URINALYSIS, ROUTINE W REFLEX MICROSCOPIC    CT ABDOMEN PELVIS W CONTRAST    (Results Pending)    Medications  sodium chloride  0.9 % bolus 1,000 mL (has no  administration in time range)  ondansetron  (ZOFRAN ) injection 4 mg (has no administration in time range)  morphine  (PF) 4 MG/ML injection 4 mg (has no administration in time range)     Procedures  /  Critical Care Procedures  ED Course and Medical Decision Making  Initial Impression and Ddx Differential diagnosis includes small bowel obstruction, diverticulitis, kidney stone.  Past medical/surgical history that increases complexity of ED encounter: History of partial colectomy  Interpretation of Diagnostics Labs and CT pending  Patient Reassessment and Ultimate Disposition/Management     Signed out to oncoming provider at shift change.  Patient management required discussion with the following services or consulting groups:  None  Complexity of Problems Addressed Acute illness or injury that poses threat of life of bodily function  Additional Data Reviewed and Analyzed Further history obtained from: None  Additional Factors Impacting ED Encounter Risk Use of parenteral controlled substances  Ishita Mcnerney M. Theadore, MD [facility] Emergency Medicine [facility] [email or web address]  Final Clinical Impressions(s) / ED Diagnoses     ICD-10-CM   1. Abdominal pain, unspecified abdominal location  R10.9       ED Discharge Orders     None        Discharge Instructions Discussed with and Provided to Patient:   Discharge Instructions   None      Theadore Ozell HERO, MD 02/21/24 0630

## 2024-02-21 NOTE — ED Triage Notes (Signed)
 Pt c/o left sided abd pain with n/d x 3 days

## 2024-02-21 NOTE — Telephone Encounter (Signed)
 FYI

## 2024-02-21 NOTE — ED Notes (Signed)
 Chief Complaint  Patient presents with   Abdominal Pain   Pt to [facility] via [facility] from [facility] d/t feeling lightheaded during registration. Pt ambulated to bed with steady gait, placed in gown, on monitor. VS WNL. Afebrile. C/o N/V/D x3 days, hx multiple GI surgeries. PIV20G LFA placed, blood drawn and sent to lab. EDMD at bedside. See new orders.

## 2024-02-22 LAB — URINE CULTURE: Culture: <10000 — AB

## 2024-03-11 ENCOUNTER — Encounter: Payer: Self-pay | Admitting: Internal Medicine

## 2024-03-11 ENCOUNTER — Ambulatory Visit (INDEPENDENT_AMBULATORY_CARE_PROVIDER_SITE_OTHER): Admitting: Internal Medicine

## 2024-03-11 VITALS — BP 122/72 | HR 67 | Ht 70.0 in | Wt 161.0 lb

## 2024-03-11 DIAGNOSIS — R1031 Right lower quadrant pain: Secondary | ICD-10-CM | POA: Diagnosis not present

## 2024-03-11 DIAGNOSIS — M6289 Other specified disorders of muscle: Secondary | ICD-10-CM | POA: Diagnosis not present

## 2024-03-11 DIAGNOSIS — E538 Deficiency of other specified B group vitamins: Secondary | ICD-10-CM

## 2024-03-11 DIAGNOSIS — R634 Abnormal weight loss: Secondary | ICD-10-CM

## 2024-03-11 DIAGNOSIS — R4589 Other symptoms and signs involving emotional state: Secondary | ICD-10-CM

## 2024-03-11 DIAGNOSIS — R630 Anorexia: Secondary | ICD-10-CM

## 2024-03-11 DIAGNOSIS — G8929 Other chronic pain: Secondary | ICD-10-CM

## 2024-03-11 DIAGNOSIS — Z7960 Long term (current) use of unspecified immunomodulators and immunosuppressants: Secondary | ICD-10-CM

## 2024-03-11 DIAGNOSIS — K50018 Crohn's disease of small intestine with other complication: Secondary | ICD-10-CM | POA: Diagnosis not present

## 2024-03-11 MED ORDER — CYANOCOBALAMIN 1000 MCG/ML IJ SOLN: 1000.0000 ug | INTRAMUSCULAR | 1 refills | Status: DC

## 2024-03-11 NOTE — Patient Instructions (Signed)
 Dr Avram wants you to start monthly B12 injections. Once you pick up the medicine give us  a call and let us  know when you want to come. Phone # [phone].  Call us  in October to get a 4-6 month follow up appointment.  Follow up with Dr Johnny about medication for mood.   _______________________________________________________  If your blood pressure at your visit was 140/90 or greater, please contact your primary care physician to follow up on this.  _______________________________________________________  If you are age 4 or older, your body mass index should be between 23-30. Your Body mass index is 23.1 kg/m. If this is out of the aforementioned range listed, please consider follow up with your Primary Care Provider.  If you are age 71 or younger, your body mass index should be between 19-25. Your Body mass index is 23.1 kg/m. If this is out of the aformentioned range listed, please consider follow up with your Primary Care Provider.   ________________________________________________________  The [facility] providers would like to encourage you to use MYCHART to communicate with providers for non-urgent requests or questions.  Due to long hold times on the telephone, sending your provider a message by [facility] may be a faster and more efficient way to get a response.  Please allow 48 business hours for a response.  Please remember that this is for non-urgent requests.  _______________________________________________________  [facility] is using a team-based approach to care.  Your team is made up of your doctor and two to three APPS. Our APPS (Nurse Practitioners and Physician Assistants) work with your physician to ensure care continuity for you. They are fully qualified to address your health concerns and develop a treatment plan. They communicate directly with your gastroenterologist to care for you. Seeing the Advanced Practice Practitioners on your physician's team can  help you by facilitating care more promptly, often allowing for earlier appointments, access to diagnostic testing, procedures, and other specialty referrals.   I appreciate the opportunity to care for you. Lupita Avram, MD, [facility]

## 2024-03-11 NOTE — Progress Notes (Signed)
 TALLIN HART 73 y.o. [birth year] [id number]  Assessment & Plan:   Encounter Diagnoses  Name Primary?   Crohn's disease of small intestine with other complication (HCC) Yes   RLQ abdominal pain    Pelvic floor dysfunction and chronic pain    Loss of weight    Anorexia    Depressed mood    B12 deficiency    Long-term use of immunosuppressant medication    Crohn's disease seems improved overall.  Continue colestipol for component of bile salt diarrhea.  Continue Rinvoq  30 mg daily.  He will follow-up with Dr. Johnny regarding depressed mood and anorexia.  I think the anorexia may be function of depression as opposed to his other illnesses.  Chronic pain certainly having a role.  Question if duloxetine is appropriate for him defer to Dr. Johnny.  It could help with pain symptoms potentially.  Pain clinic referral reasonable option though admittedly his type of pain is different than what most pain specialist deal with.  He needs to get back on B12 injections he took them weekly x 4 earlier this year, I do not think he is going to absorb well with oral supplementation so we will set up monthly injections here.  Return here in 4 to 6 months.  Need to recheck lipids CBC and CMET when he returns.   Meds ordered this encounter  Medications   cyanocobalamin  (VITAMIN B12) 1000 MCG/ML injection    Sig: Inject 1 mL (1,000 mcg total) into the muscle every 30 (thirty) days.    Dispense:  10 mL    Refill:  1     Subjective:  Gastroenterology problem summary   Crohn's disease of the small intestine-ileitis in a patient status post small bowel resection as below: June 2013 47 cm small bowel resection incarcerated ventral hernia Small intestine, resection - SMALL BOWEL WITH EXTENSIVE ACUTE SEROSITIS AND ASSOCIATED FAT NECROSIS AND ADHESIONS. - NO ATYPIA OR MALIGNANCY. - RESECTION MARGINS VIABLE.   Original Crohn's diagnosis October 2018 colonoscopy, budesonide  therapy then 6-MP plus  Humira  early 2019, developed pancreatitis from 6-MP.  Additional budesonide  and then weekly Humira  but antibodies very high 04/05/2018 and loss of efficacy. Prednisone  used as budesonide  was not helpful, Stelara  tried early 2020 and then moved to Entyvio  fall 2020   2022 having problems with perineal and then abdominal pain and diarrhea progressed into 2023 and in March of that year CT showing enteritis/ileitis referred to Dr. Merrilyn and had surgery       November 11, 2021 small bowel resection, two 9 cm long sections A. SMALL BOWEL ANASTOMOSIS, RESECTION:             - Anastomotic site, present.              - Small bowel mucosa with no significant abnormalities.              - Serosa with fibrous adhesions and histiocytic giant cell reaction to mesh material.    B. MESH, REMOVAL:             - Mesh with attached adipose and membranous tissue, gross examination only.    C. SMALL BOWEL ANASTOMOSIS #2, RESECTION:             - Chronic active ileitis with pseudopolyp formation and mucosa ulceration.  - Anastomotic site identified.  - See comment.    D. MESH #2 , REMOVAL:             -  Mesh with attached adipose tissue, gross examination only.    Several admissions due to high output ileostomy and need for hydration and TPN rehab stays, also complicated by abdominal wall abscess requiring drainage.  Was on home TPN.  Dr. Merrilyn did not want to takedown ileostomy. Was staying with his sister in [place] (she could help with TPN) and saw Dr. [name] at [facility]   08/29/2022 preoperative colonoscopy mild diversion colitis noted throughout the colon exam to long Hartmann stump and   08/30/2022 ileostomy reversal at [facility]      Small intestine (ileostomy trim): -Benign skin small intestinal tissue consistent with ostomy site.      Electronically signed by Reda Felisa Gully, MD on 08/31/2022 at 10:24 AM  Clinical History    Pre-op diagnosis: CROHN'S DISEASE  Gross Description     A.  Label: Small intestine (ileostomy trim)-permanent Stoma size: 2.2 x 2.1 cm Surrounding skin: 0.5 cm wide rim of lightly pigmented skin Attached bowel: 1.5 cm in length by 1.7 cm in diameter; unremarkable Other findings: An additional 4.5 x 2.2 x 1.5 cm mucosal fragment is present; unremarkable      Multiple deficiencies-thiamine , potassium iron, vitamin D  detected 10/04/2022 follow-up   April and May 2024 visits-Entyvio  antibody testing negative, decided to treat with Rinvoq .  He is awaiting that.  Calprotectin 275 in April.  C-reactive protein normal.  Thiamine  was low and responded to therapy.  Ferritin 22 B12 and folate normal.  On iron.    Anal fissure -posterior diagnosed June 2024 treated with diltiazem  and lidocaine  and then nifedipine   B12 deficiency diagnosed April 2025 -injections started by Dr. Johnny   Chronic abdominal and pelvic pain-says he has pelvic floor pain times many years proceeding recent events also chronic prostatitis  Colonoscopy 01/29/2024-extrinsic compression/stenosis in the sigmoid colon, normal appearing ileocolonic anastomosis and colon otherwise.  Anastomotic biopsies with inflammation-favor anastomotic site changes, normal colon biopsies.   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 7 days; 6-MP discontinued 2/2  pancreatitis 2020 - Trial of Stelara  (unresponsive) and subsequently Entyvio  with improvement 2022 - Perineal/abdominal pain with active inflammation on CT 10/2021 [facility]) - Ex lap, LOA, mesh removal, ileocolic resection, end ileostomy -managed on Entyvio  postop 02/2022 [facility]) - EC fistula takedown c/b high output ileostomy and FTT requiring TPN 08/2022 ([facility]) -Ileostomy takedown 12/2022 - Induction Rinvoq  15 mg po daily February 2024 increased Rinvoq  to 30 mg daily, prednisone  taper started-CT scanning 09/03/2023 showed mild stranding of adjacent fat near the small bowel and he was having increasing right lower quadrant pain August 2025-second opinion Dr. Hayes Dess, cholestyramine  changed to colestipol, continue Rinvoq  to see dietitian      ----------------------------------------------------------------------------------------------------------   Chief Complaint: Loss of weight loss of appetite chronic pain  HPI 73 year old man with a history of Crohn's disease of the small bowel as summarized above, chronic pelvic floor dysfunction  pain and chronic right lower quadrant pain, B12 deficiency and significant small bowel resection here for follow-up.  He was last seen a colonoscopy in July on the 15th and it looked normal though the anastomotic biopsy showed some inflammatory change it was thought to favor anastomotic changes and not IBD.  He went to see Dr. Hayes at [facility] on August 14 for a second opinion and also was seen by Dr. Merrilyn though not examined.  Dr. Hayes changed cholestyramine  to colestipol which has been beneficial.  He plans to refer to a dietitian.  He recommended return to my ongoing care.  He experiences ongoing diarrhea , which has improved to about three episodes per day with the use of Lomotil  and colestipol.   He has significant pain in the right lower quadrant and pelvic floor, described as 'very bad'. He is undergoing pelvic floor  therapy, which he finds beneficial. He has a history of multiple surgeries and adhesions, and thinks this is likely contributing to his chronic pain and tension in the pelvic floor.  Recent emergency department visit August 7 for left lower quadrant pain with CT scan showing left ureteral stone, urinalysis with hematuria.  He has seen urology in follow-up and those symptoms are gone.  He reports a lack of appetite and has lost five pounds.  He acknowledges feeling depressed, with symptoms including lack of motivation, fatigue, and a sense of purposelessness. No suicidal ideation, but he wishes the pain would go away.     Wt Readings from Last 3 Encounters:  03/11/24 161 lb (73 kg)  02/21/24 164 lb (74.4 kg)  01/31/24 164 lb 3.2 oz (74.5 kg)   02/28/24 [facility]- IBD Dr. Hayes Assessment and Plan: 1 Crohn's disease Mr. Enns is a 73 year old man with a history of small bowel Crohn's disease who is presently in endoscopic remission on Rinvoq  30 mg daily. He has diarrhea that is likely postsurgical in nature and I would recommend that he continue Lomotil  and add a bile salt binding resin. He did not do well with Questran  so I have sent in a prescription for Colestid. He has chronic abdominal pain that is not associated with active disease. He will talk to his primary doctor about referral to a local pain management specialist to see if he can get off of narcotics. He is currently working with a pelvic floor physical therapist which might help his bowel symptoms and pain as well. Lastly I will schedule a visit with our IBD dietitian. We additionally discussed the importance of compliance with monthly vitamin B12 injections. Mr. Howat may follow-up locally with Dr. Avram for monitoring.  2 Fatigue Mr. Ayllon reports that he is having problems with fatigue. We discussed trying to get better control of his frequent bowel movements might improve his sleep. He will see our dietitian to try to  improve his diet.  Charlie GORMAN Hayes, MD  Allergies  Allergen Reactions   Purinethol  [Mercaptopurine ] Other (See Comments)    Pancreatitis    Shellfish Allergy Anaphylaxis and Swelling    Can use standard SMOF lipid formulation for TPN without any issue.    Humira  [Adalimumab ] Other (See Comments)    Developed antibodies   Tape Rash   Wound Dressing Adhesive Rash   Current Meds  Medication Sig   acetaminophen  (TYLENOL ) 325 MG tablet Take 650 mg by mouth every 6 (six) hours as needed for moderate pain.   albuterol  (VENTOLIN  HFA) 108 (90 Base) MCG/ACT inhaler Inhale 2 puffs into  the lungs every 6 (six) hours as needed for wheezing or shortness of breath.   AMBULATORY NON FORMULARY MEDICATION Medication Name: nifedipine 0.2%/lidocaine  5% ointment 1:1 mix Apply 3-4 tmes/day into anal canal   ascorbic acid (VITAMIN C) 250 MG CHEW Chew by mouth.   busPIRone  (BUSPAR ) 10 MG tablet Take 1 tablet (10 mg total) by mouth every morning.   calcium  carbonate (TUMS - DOSED IN MG ELEMENTAL CALCIUM ) 500 MG chewable tablet Chew 500 mg by mouth 3 (three) times daily as needed for indigestion or heartburn.   Cholecalciferol (VITAMIN D ) 50 MCG (2000 UT) tablet Take 1 tablet (2,000 Units total) by mouth daily.   colestipol (COLESTID) 1 g tablet Take 1 g by mouth 2 (two) times daily.   cyanocobalamin  (VITAMIN B12) 1000 MCG/ML injection Inject 1 mL (1,000 mcg total) into the muscle every 30 (thirty) days.   diphenoxylate -atropine  (LOMOTIL ) 2.5-0.025 MG tablet Take 2 tablets by mouth 4 (four) times daily as needed for diarrhea or loose stools. TAKE 2 TABLETS BY MOUTH FOUR TIMES DAILY AS NEEDED FOR DIARRHEA OR LOOSE STOOLS   ferrous sulfate  325 (65 FE) MG tablet Take 1 tablet (325 mg total) by mouth daily with breakfast.   finasteride  (PROSCAR ) 5 MG tablet Take 1 tablet (5 mg total) by mouth daily.   gabapentin  (NEURONTIN ) 100 MG capsule Take 1 capsule (100 mg total) by mouth at bedtime.    HYDROcodone -acetaminophen  (NORCO) 10-325 MG tablet Take 1 tablet by mouth every 8 (eight) hours as needed for moderate pain (pain score 4-6).   HYDROcodone -acetaminophen  (NORCO) 10-325 MG tablet Take 1 tablet by mouth every 8 (eight) hours as needed for moderate pain (pain score 4-6).   HYDROcodone -acetaminophen  (NORCO) 10-325 MG tablet Take 1 tablet by mouth every 8 (eight) hours as needed for moderate pain (pain score 4-6).   hyoscyamine  (LEVSIN  SL) 0.125 MG SL tablet Place 0.125 mg under the tongue every 6 (six) hours as needed for cramping.   magnesium  oxide (MAG-OX) 400 MG tablet Take 1 tablet (400 mg total) by mouth 2 (two) times daily.   melatonin 3 MG TABS tablet Take 3 mg by mouth at bedtime.   Multiple Vitamin (MULTIVITAMIN WITH MINERALS) TABS tablet Take 1 tablet by mouth daily.   ondansetron  (ZOFRAN -ODT) 4 MG disintegrating tablet Take 1 tablet (4 mg total) by mouth every 8 (eight) hours as needed for nausea or vomiting.   oxybutynin  (DITROPAN -XL) 5 MG 24 hr tablet Take 5 mg by mouth daily.   oxyCODONE  (ROXICODONE ) 5 MG immediate release tablet Take 1 tablet (5 mg total) by mouth every 4 (four) hours as needed for severe pain (pain score 7-10).   phenazopyridine  (PYRIDIUM ) 200 MG tablet TAKE 1 TABLET(200 MG) BY MOUTH THREE TIMES DAILY AS NEEDED FOR URINARY PAIN   Potassium Chloride  ER 20 MEQ TBCR TAKE 1 TABLET(20 MEQ) BY MOUTH TWICE DAILY   simethicone  (MYLICON) 80 MG chewable tablet Chew 80 mg by mouth 4 (four) times daily -  with meals and at bedtime.   thiamine  (VITAMIN B1) 100 MG tablet Take 1 tablet (100 mg total) by mouth daily.   traZODone  (DESYREL ) 100 MG tablet Take 2 tablets (200 mg total) by mouth at bedtime.   Upadacitinib  ER (RINVOQ ) 30 MG TB24 Take 1 tablet (30 mg total) by mouth daily.   valACYclovir  (VALTREX ) 500 MG tablet Take 1 tablet (500 mg total) by mouth 3 (three) times daily as needed (for shingles).   [DISCONTINUED] cholestyramine  (QUESTRAN ) 4 g packet Take 1  packet (4 g total) by mouth daily with lunch.   Past Medical History:  Diagnosis Date   Acute prostatitis 07/24/2007   Qualifier: Diagnosis of  By: Johnny MD, Garnette A    Acute respiratory failure with hypoxia [facility])    Allergy    mild   Arthritis    osteoarthritis   Aspiration pneumonia (HCC) 05/24/2022   Asthma    Blood transfusion without reported diagnosis    BPH (benign prostatic hypertrophy) with urinary obstruction    Crohn's ileitis (HCC) suspected 05/03/2017   Dilated aortic root (HCC)    noted on echo 08/2012   Diverticulitis of colon    Drug-induced acute pancreatitis without infection or necrosis - from 6 MP 09/24/2017   EPIDIDYMITIS 02/15/2010   Qualifier: Diagnosis of  By: Johnny MD, Garnette LABOR    GERD (gastroesophageal reflux disease)    H/O: GI bleed    Hemorrhoids    Hepatitis 1975   unknown type    HERPES SIMPLEX INFECTION 10/14/2007   Qualifier: Diagnosis of  By: Johnny MD, Garnette A    Hiatal hernia    Ileus following gastrointestinal surgery (HCC) 12/26/2011   Long term (current) use of systemic steroids 06/18/2018   Psoriasis    sees Dr. Donnice Sims at [facility].   Rectal fissure    Recurrent ventral incisional hernia 05/10/2012   Shingles    Short bowel syndrome    SVT (supraventricular tachycardia) (HCC)    Ulcer 08/21/2013   ileal   Past Surgical History:  Procedure Laterality Date   BOWEL RESECTION  12/19/2011   Procedure: SMALL BOWEL RESECTION;  Surgeon: Morene ONEIDA Olives, MD;  Location: [facility];  Service: General;  Laterality: N/A;  with anastamosis and insertion mesh   BRONCHOSCOPY     COLON SURGERY  01/2004   x 2   COLONOSCOPY N/A 01/29/2024   Procedure: COLONOSCOPY;  Surgeon: Avram Lupita BRAVO, MD;  Location: [facility];  Service: Gastroenterology;  Laterality: N/A;   COLONOSCOPY W/ BIOPSIES  04/26/2017   per Dr. Avram, no polyps, benign inflammation, repeat in 5 yrs    CYSTOSCOPY     ESOPHAGOGASTRODUODENOSCOPY     HEMICOLECTOMY      left side, at [facility], diverticulitis   HEMORRHOID BANDING     HERNIA REPAIR     [phone] incisional hernia   ILEOSTOMY     ILEOSTOMY CLOSURE     INSERTION OF MESH  07/31/2012   Procedure: INSERTION OF MESH;  Surgeon: Morene ONEIDA Olives, MD;  Location: [facility];  Service: General;;   LAPAROTOMY  12/19/2011   Procedure: EXPLORATORY LAPAROTOMY;  Surgeon: Morene ONEIDA Olives, MD;  Location: [facility];  Service: General;  Laterality: N/A;   PACEMAKER IMPLANT N/A 12/01/2020   Procedure: PACEMAKER IMPLANT;  Surgeon: Inocencio Soyla Lunger, MD;  Location: [facility];  Service: Cardiovascular;  Laterality: N/A;   PACEMAKER INSERTION Left    TONSILLECTOMY     UPPER GASTROINTESTINAL ENDOSCOPY     VASECTOMY     VENTRAL HERNIA REPAIR  07/31/2012   Procedure: HERNIA REPAIR VENTRAL ADULT;  Surgeon: Morene ONEIDA Olives, MD;  Location: [facility];  Service: General;  Laterality: N/A;   Social History   Social History Narrative   He is married with 2 sons 1 son is an Acupuncturist and the 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   Review of Systems  As per HPI Objective:   Physical Exam @BP  122/72   Pulse 67   Ht 5' 10 (1.778 m)   Wt 161 lb (73 kg)   BMI 23.10 kg/m @  General:  NAD Eyes:   anicteric Lungs:  clear Heart::  S1S2 no rubs, murmurs or gallops Abdomen:  soft and mild to moderately tender along midline surgical incision to the right where there is a small defect/bulge.  I do not think it is a hernia Ext:   no edema, cyanosis or clubbing    Data Reviewed:  As  per HPI  I spent 52 minutes of time, including in depth chart review, independent review of results as outlined above, communicating results with the patient directly, face-to-face time with the patient, coordinating care, ordering studies and medications as appropriate, and documentation.

## 2024-03-13 ENCOUNTER — Encounter: Payer: Self-pay | Admitting: Family Medicine

## 2024-03-13 ENCOUNTER — Ambulatory Visit (INDEPENDENT_AMBULATORY_CARE_PROVIDER_SITE_OTHER): Admitting: Family Medicine

## 2024-03-13 ENCOUNTER — Telehealth: Payer: Self-pay

## 2024-03-13 VITALS — BP 110/60 | HR 65 | Temp 98.4°F | Wt 162.8 lb

## 2024-03-13 DIAGNOSIS — J0140 Acute pansinusitis, unspecified: Secondary | ICD-10-CM | POA: Diagnosis not present

## 2024-03-13 DIAGNOSIS — M6289 Other specified disorders of muscle: Secondary | ICD-10-CM

## 2024-03-13 DIAGNOSIS — J069 Acute upper respiratory infection, unspecified: Secondary | ICD-10-CM

## 2024-03-13 DIAGNOSIS — R1031 Right lower quadrant pain: Secondary | ICD-10-CM

## 2024-03-13 DIAGNOSIS — K50018 Crohn's disease of small intestine with other complication: Secondary | ICD-10-CM

## 2024-03-13 MED ORDER — AMOXICILLIN-POT CLAVULANATE 500-125 MG PO TABS: 1.0000 | ORAL_TABLET | Freq: Two times a day (BID) | ORAL | 0 refills | Status: AC

## 2024-03-13 NOTE — Progress Notes (Signed)
 Established Patient Office Visit   Subjective  Patient ID: [name], male    DOB: 04/06/1951  Age: 73 y.o. MRN: [id number]  Chief Complaint  Patient presents with   Sore Throat   Headache   Shortness of Breath    Pt is a 73 year old male followed by Dr. Johnny ongoing concern.  Pt with frontal HA, ST, SOB, nasal congestion, chest pressure, coughing, wheezing, and decreased appetite x 2 wks.  Question of subjective fever.  Denies sick contacts.  Taking regular meds.  Sore Throat  Associated symptoms include headaches and shortness of breath.  Headache   Shortness of Breath Associated symptoms include headaches.    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   Bladder calculi 05/01/2022   Dysuria    Pelvic floor dysfunction 04/27/2022   Crohn's disease of small intestine with other complication (HCC) 02/13/2022   Protein-calorie malnutrition, severe 01/05/2022   Pacemaker 01/03/2022   External hemorrhoid, thrombosed 05/27/2019   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   Long-term use of immunosuppressant medication  -Rinvoq  06/28/2017   Crohn's ileitis (HCC)  05/03/2017   Psoriasis 08/23/2016   Insomnia 04/22/2014   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/06/2012   SVT (supraventricular tachycardia) (HCC) 08/04/2012   Vitamin D  deficiency 06/18/2012   Attention deficit hyperactivity disorder (ADHD) 05/28/2009   BPH (benign prostatic hyperplasia) 07/24/2007   Asthma 03/06/2007   GERD 03/06/2007   DIVERTICULITIS, HX OF 03/06/2007   Past Medical History:  Diagnosis Date   Acute prostatitis 07/24/2007   Qualifier: Diagnosis of  By: Johnny MD,  Garnette A    Acute respiratory failure with hypoxia (HCC)    Allergy    mild   Arthritis    osteoarthritis   Aspiration pneumonia (HCC) 05/24/2022   Asthma    Blood transfusion without reported diagnosis    BPH (benign prostatic hypertrophy) with urinary obstruction    Crohn's ileitis (HCC) suspected 05/03/2017   Dilated aortic root (HCC)    noted on echo 08/2012   Diverticulitis of colon    Drug-induced acute pancreatitis without infection or necrosis - from 6 MP 09/24/2017   EPIDIDYMITIS 02/15/2010   Qualifier: Diagnosis of  By: Johnny MD, Garnette LABOR    GERD (gastroesophageal reflux disease)    H/O: GI bleed    Hemorrhoids    Hepatitis 1975   unknown type    HERPES SIMPLEX INFECTION 10/14/2007   Qualifier: Diagnosis of  By: Johnny MD, Garnette A    Hiatal hernia    Ileus following gastrointestinal surgery (HCC) 12/26/2011   Long term (current) use of systemic steroids 06/18/2018   Psoriasis    sees Dr. Donnice Sims at [facility]   Rectal fissure    Recurrent ventral incisional hernia 05/10/2012   Shingles    Short bowel syndrome    SVT (supraventricular tachycardia) (HCC)    Ulcer 08/21/2013   ileal   Past Surgical History:  Procedure Laterality Date   BOWEL RESECTION  12/19/2011   Procedure: SMALL BOWEL RESECTION;  Surgeon: Morene ONEIDA Olives, MD;  Location: [facility];  Service: General;  Laterality: N/A;  with anastamosis and insertion mesh   BRONCHOSCOPY  COLON SURGERY  01/2004   x 2   COLONOSCOPY N/A 01/29/2024   Procedure: COLONOSCOPY;  Surgeon: Avram Lupita BRAVO, MD;  Location: [facility];  Service: Gastroenterology;  Laterality: N/A;   COLONOSCOPY W/ BIOPSIES  04/26/2017   per Dr. Avram, no polyps, benign inflammation, repeat in 5 yrs    CYSTOSCOPY     ESOPHAGOGASTRODUODENOSCOPY     HEMICOLECTOMY     left side, at [facility], diverticulitis   HEMORRHOID BANDING     HERNIA REPAIR     [phone] incisional hernia   ILEOSTOMY     ILEOSTOMY CLOSURE     INSERTION OF  MESH  07/31/2012   Procedure: INSERTION OF MESH;  Surgeon: Morene ONEIDA Olives, MD;  Location: [facility];  Service: General;;   LAPAROTOMY  12/19/2011   Procedure: EXPLORATORY LAPAROTOMY;  Surgeon: Morene ONEIDA Olives, MD;  Location: [facility];  Service: General;  Laterality: N/A;   PACEMAKER IMPLANT N/A 12/01/2020   Procedure: PACEMAKER IMPLANT;  Surgeon: Inocencio Soyla Lunger, MD;  Location: [facility];  Service: Cardiovascular;  Laterality: N/A;   PACEMAKER INSERTION Left    TONSILLECTOMY     UPPER GASTROINTESTINAL ENDOSCOPY     VASECTOMY     VENTRAL HERNIA REPAIR  07/31/2012   Procedure: HERNIA REPAIR VENTRAL ADULT;  Surgeon: Morene ONEIDA Olives, MD;  Location: [facility];  Service: General;  Laterality: N/A;   Social History   Tobacco Use   Smoking status: Never   Smokeless tobacco: Never  Vaping Use   Vaping status: Never Used  Substance Use Topics   Alcohol use: Not Currently    Comment: occ   Drug use: Yes    Types: Oxycodone    Family History  Problem Relation Age of Onset   Lung cancer Mother    Leukemia Father    Hypertension Father    Heart disease Father    Heart attack Father    Prostate cancer Father    Prostate cancer Paternal Uncle    Colon cancer Neg Hx    Stomach cancer Neg Hx    Colon polyps Neg Hx    Esophageal cancer Neg Hx    Rectal cancer Neg Hx    Allergies  Allergen Reactions   Purinethol  [Mercaptopurine ] Other (See Comments)    Pancreatitis    Shellfish Allergy Anaphylaxis and Swelling    Can use standard SMOF lipid formulation for TPN without any issue.    Humira  [Adalimumab ] Other (See Comments)    Developed antibodies   Tape Rash   Wound Dressing Adhesive Rash    Review of Systems  Respiratory:  Positive for shortness of breath.   Neurological:  Positive for headaches.   Negative unless stated above    Objective:     BP 110/60   Pulse 65   Temp 98.4 F (36.9 C) (Oral)   Wt 162 lb 12.8 oz (73.8 kg)   SpO2 97%   BMI 23.36  kg/m  BP Readings from Last 3 Encounters:  03/13/24 110/60  03/11/24 122/72  02/21/24 (!) 146/98   Wt Readings from Last 3 Encounters:  03/13/24 162 lb 12.8 oz (73.8 kg)  03/11/24 161 lb (73 kg)  02/21/24 164 lb (74.4 kg)      Physical Exam Constitutional:      General: He is not in acute distress.    Appearance: Normal appearance.  HENT:     Head: Normocephalic and atraumatic.     Ears:     Comments: B/l Tms  full, L>R.    Nose:     Right Sinus: Maxillary sinus tenderness and frontal sinus tenderness present.     Left Sinus: Maxillary sinus tenderness and frontal sinus tenderness present.     Mouth/Throat:     Mouth: Mucous membranes are moist.  Cardiovascular:     Rate and Rhythm: Normal rate and regular rhythm.     Heart sounds: Normal heart sounds. No murmur heard.    No gallop.  Pulmonary:     Effort: Pulmonary effort is normal. No respiratory distress.     Breath sounds: Normal breath sounds. No wheezing, rhonchi or rales.  Skin:    General: Skin is warm and dry.  Neurological:     Mental Status: He is alert and oriented to person, place, and time.     No results found for any visits on 03/13/24.    Assessment & Plan:   Acute pansinusitis, recurrence not specified -     Amoxicillin -Pot Clavulanate; Take 1 tablet by mouth in the morning and at bedtime for 7 days.  Dispense: 14 tablet; Refill: 0  Upper respiratory tract infection, unspecified type  Start abx for URI sx x 2 wks, likely sinusitis.  Continue supportive care including hydration.  Given strict precautions.  Can f/u with pcp as has an appt already scheduled for next wk.  Return if symptoms worsen or fail to improve.   Clotilda JONELLE Single, MD

## 2024-03-13 NOTE — Telephone Encounter (Signed)
-----   Message from Lupita Commander sent at 03/12/2024  7:09 PM EDT ----- Regarding: RE: possible referral Thank you so much.  We will put in a referral to evaluate and treat chronic pain and let the patient know.  Lupita ----- Message ----- From: Lorilee Sven SQUIBB, MD Sent: 03/11/2024   6:12 PM EDT To: Lupita FORBES Commander, MD Subject: RE: possible referral                          Hi Dr. Commander! I would be happy to see this patient! ----- Message ----- From: Commander Lupita FORBES, MD Sent: 03/11/2024   5:25 PM EDT To: Sven SQUIBB Lorilee, MD Subject: possible referral                              Dr. Lucas,  This patient has Crohn's disease and chronic abdominal pain as well as pelvic floor pain.  He is undergoing pelvic floor physical therapy with some benefit but still suffers with chronic pain issues.  Some of his right lower quadrant pain or all of it could be due to adhesions.  He is on narcotics with some benefit.  Trying to find additional help for his pain issues.  Would this be someone you would be willing to see.  I completely understand you may think referral to you would be inappropriate.  Thank you for your time and consideration.  Best regards,  Lupita Commander

## 2024-03-13 NOTE — Telephone Encounter (Signed)
 Referral placed in epic for Dr Sven Elks and Valere is aware of the Drs name, address and phone number. They are located at : [address], [place] [address]. Phone# [phone]. He will contact them if he doesn't hear from them.

## 2024-03-18 ENCOUNTER — Ambulatory Visit (INDEPENDENT_AMBULATORY_CARE_PROVIDER_SITE_OTHER): Payer: Medicare Other

## 2024-03-18 ENCOUNTER — Encounter: Payer: Self-pay | Admitting: Internal Medicine

## 2024-03-18 DIAGNOSIS — I495 Sick sinus syndrome: Secondary | ICD-10-CM | POA: Diagnosis not present

## 2024-03-19 ENCOUNTER — Encounter: Payer: Self-pay | Admitting: Family Medicine

## 2024-03-19 ENCOUNTER — Ambulatory Visit (INDEPENDENT_AMBULATORY_CARE_PROVIDER_SITE_OTHER): Admitting: Family Medicine

## 2024-03-19 VITALS — BP 110/60 | HR 60 | Temp 97.7°F | Wt 160.6 lb

## 2024-03-19 DIAGNOSIS — F418 Other specified anxiety disorders: Secondary | ICD-10-CM

## 2024-03-19 DIAGNOSIS — G8929 Other chronic pain: Secondary | ICD-10-CM

## 2024-03-19 DIAGNOSIS — G894 Chronic pain syndrome: Secondary | ICD-10-CM | POA: Diagnosis not present

## 2024-03-19 MED ORDER — HYDROCODONE-ACETAMINOPHEN 10-325 MG PO TABS: 1.0000 | ORAL_TABLET | Freq: Four times a day (QID) | ORAL | 0 refills | Status: DC | PRN

## 2024-03-19 MED ORDER — DULOXETINE HCL 30 MG PO CPEP: 30.0000 mg | ORAL_CAPSULE | Freq: Every day | ORAL | 5 refills | Status: DC

## 2024-03-19 NOTE — Progress Notes (Signed)
   Subjective:    Patient ID: [name], male    DOB: Sep 29, 1950, 73 y.o.   MRN: [id number]  HPI Here for pain management. His pain levels have been increasing, and he has started taking the Norco 4 times a day to get relief. He also says he has been more depressed recently, and his appetite is very poor. He has been taking Buspar  for some time, but it has not been helping much. He does sleep well. He denies any suicidal thoughts.    Review of Systems  Constitutional: Negative.   Psychiatric/Behavioral:  Positive for dysphoric mood. Negative for self-injury, sleep disturbance and suicidal ideas. The patient is nervous/anxious.        Objective:   Physical Exam Constitutional:      Appearance: Normal appearance.  Neurological:     Mental Status: He is alert and oriented to person, place, and time.  Psychiatric:        Behavior: Behavior normal.        Thought Content: Thought content normal.     Comments: Affect is mildly depressed            Assessment & Plan:  Pain management. His pain has been getting worse and he has become quite depressed. Of course these issues affect each other.  Indication for chronic opioid: pelvic floor pain Medication and dose: Norco 10-325 # pills per month: 120 Last UDS date: 05-25-23 Opioid Treatment Agreement signed (Y/N): 05-25-23 Opioid Treatment Agreement last reviewed with patient:  03-19-24 [facility] reviewed this encounter (include red flags): Yes We will increase the Norco to taking it QID. For the depression, we will stop Buspar  and start Cymbalta  30 mg daily. Hopefully this will also increase his appetite. He also plans to start seeing a therapist, and I heartily agreed. We will follow up in 3 weeks.  Garnette Olmsted, MD

## 2024-03-20 LAB — CUP PACEART REMOTE DEVICE CHECK
Battery Remaining Longevity: 89 mo
Battery Remaining Percentage: 71 %
Battery Voltage: 2.99 V
Brady Statistic AP VP Percent: 26 %
Brady Statistic AP VS Percent: 64 %
Brady Statistic AS VP Percent: 1 %
Brady Statistic AS VS Percent: 9.2 %
Brady Statistic RA Percent Paced: 90 %
Brady Statistic RV Percent Paced: 26 %
Date Time Interrogation Session: 20250902020013
Implantable Lead Connection Status: 753985
Implantable Lead Connection Status: 753985
Implantable Lead Implant Date: 20220518
Implantable Lead Implant Date: 20220518
Implantable Lead Location: 753859
Implantable Lead Location: 753860
Implantable Lead Model: 1944
Implantable Pulse Generator Implant Date: 20220518
Lead Channel Impedance Value: 440 Ohm
Lead Channel Impedance Value: 530 Ohm
Lead Channel Pacing Threshold Amplitude: 0.375 V
Lead Channel Pacing Threshold Amplitude: 0.5 V
Lead Channel Pacing Threshold Pulse Width: 0.5 ms
Lead Channel Pacing Threshold Pulse Width: 0.5 ms
Lead Channel Sensing Intrinsic Amplitude: 5 mV
Lead Channel Sensing Intrinsic Amplitude: 7.6 mV
Lead Channel Setting Pacing Amplitude: 0.75 V
Lead Channel Setting Pacing Amplitude: 1.375
Lead Channel Setting Pacing Pulse Width: 0.5 ms
Lead Channel Setting Sensing Sensitivity: 2 mV
Pulse Gen Model: 2272
Pulse Gen Serial Number: 3925308

## 2024-03-21 ENCOUNTER — Ambulatory Visit: Payer: Self-pay | Admitting: Cardiology

## 2024-03-25 NOTE — Progress Notes (Signed)
 Remote PPM Transmission

## 2024-03-31 ENCOUNTER — Encounter (HOSPITAL_BASED_OUTPATIENT_CLINIC_OR_DEPARTMENT_OTHER): Payer: Self-pay

## 2024-03-31 ENCOUNTER — Emergency Department (HOSPITAL_BASED_OUTPATIENT_CLINIC_OR_DEPARTMENT_OTHER)

## 2024-03-31 ENCOUNTER — Observation Stay (HOSPITAL_BASED_OUTPATIENT_CLINIC_OR_DEPARTMENT_OTHER)
Admission: EM | Admit: 2024-03-31 | Discharge: 2024-04-01 | Disposition: A | Discharge status: Home / Self Care | Attending: Internal Medicine | Admitting: Internal Medicine

## 2024-03-31 ENCOUNTER — Other Ambulatory Visit: Payer: Self-pay

## 2024-03-31 ENCOUNTER — Emergency Department (HOSPITAL_BASED_OUTPATIENT_CLINIC_OR_DEPARTMENT_OTHER): Admitting: Radiology

## 2024-03-31 DIAGNOSIS — R42 Dizziness and giddiness: Secondary | ICD-10-CM | POA: Diagnosis present

## 2024-03-31 DIAGNOSIS — R7989 Other specified abnormal findings of blood chemistry: Secondary | ICD-10-CM | POA: Diagnosis not present

## 2024-03-31 DIAGNOSIS — I77819 Aortic ectasia, unspecified site: Secondary | ICD-10-CM | POA: Diagnosis not present

## 2024-03-31 DIAGNOSIS — E441 Mild protein-calorie malnutrition: Secondary | ICD-10-CM | POA: Diagnosis not present

## 2024-03-31 DIAGNOSIS — I951 Orthostatic hypotension: Secondary | ICD-10-CM | POA: Diagnosis not present

## 2024-03-31 DIAGNOSIS — I452 Bifascicular block: Secondary | ICD-10-CM | POA: Diagnosis not present

## 2024-03-31 DIAGNOSIS — R197 Diarrhea, unspecified: Secondary | ICD-10-CM | POA: Insufficient documentation

## 2024-03-31 DIAGNOSIS — G8929 Other chronic pain: Secondary | ICD-10-CM | POA: Diagnosis not present

## 2024-03-31 DIAGNOSIS — Z950 Presence of cardiac pacemaker: Secondary | ICD-10-CM | POA: Insufficient documentation

## 2024-03-31 DIAGNOSIS — J45909 Unspecified asthma, uncomplicated: Secondary | ICD-10-CM | POA: Diagnosis not present

## 2024-03-31 DIAGNOSIS — I5A Non-ischemic myocardial injury (non-traumatic): Secondary | ICD-10-CM | POA: Diagnosis not present

## 2024-03-31 DIAGNOSIS — R630 Anorexia: Secondary | ICD-10-CM | POA: Insufficient documentation

## 2024-03-31 DIAGNOSIS — K5090 Crohn's disease, unspecified, without complications: Secondary | ICD-10-CM | POA: Insufficient documentation

## 2024-03-31 DIAGNOSIS — R0602 Shortness of breath: Secondary | ICD-10-CM | POA: Diagnosis present

## 2024-03-31 DIAGNOSIS — F419 Anxiety disorder, unspecified: Secondary | ICD-10-CM | POA: Insufficient documentation

## 2024-03-31 DIAGNOSIS — I495 Sick sinus syndrome: Secondary | ICD-10-CM | POA: Diagnosis not present

## 2024-03-31 DIAGNOSIS — K529 Noninfective gastroenteritis and colitis, unspecified: Secondary | ICD-10-CM | POA: Insufficient documentation

## 2024-03-31 DIAGNOSIS — Z8679 Personal history of other diseases of the circulatory system: Secondary | ICD-10-CM | POA: Insufficient documentation

## 2024-03-31 DIAGNOSIS — R791 Abnormal coagulation profile: Secondary | ICD-10-CM | POA: Diagnosis not present

## 2024-03-31 DIAGNOSIS — Z743 Need for continuous supervision: Secondary | ICD-10-CM | POA: Diagnosis not present

## 2024-03-31 DIAGNOSIS — R911 Solitary pulmonary nodule: Secondary | ICD-10-CM | POA: Diagnosis not present

## 2024-03-31 DIAGNOSIS — N182 Chronic kidney disease, stage 2 (mild): Secondary | ICD-10-CM | POA: Diagnosis not present

## 2024-03-31 LAB — URINALYSIS, ROUTINE W REFLEX MICROSCOPIC
Bilirubin Urine: NEGATIVE
Glucose, UA: NEGATIVE mg/dL
Hgb urine dipstick: NEGATIVE
Ketones, ur: NEGATIVE mg/dL
Leukocytes,Ua: NEGATIVE
Nitrite: NEGATIVE
Specific Gravity, Urine: 1.017 (ref 1.005–1.030)
pH: 8.5 — ABNORMAL HIGH (ref 5.0–8.0)

## 2024-03-31 LAB — COMPREHENSIVE METABOLIC PANEL WITH GFR
ALT: 23 U/L (ref 0–44)
AST: 31 U/L (ref 15–41)
Albumin: 4.9 g/dL (ref 3.5–5.0)
Alkaline Phosphatase: 64 U/L (ref 38–126)
Anion gap: 13 (ref 5–15)
BUN: 13 mg/dL (ref 8–23)
CO2: 26 mmol/L (ref 22–32)
Calcium: 10.5 mg/dL — ABNORMAL HIGH (ref 8.9–10.3)
Chloride: 101 mmol/L (ref 98–111)
Creatinine, Ser: 1.21 mg/dL (ref 0.61–1.24)
GFR, Estimated: >60 mL/min (ref >60)
Glucose, Bld: 80 mg/dL (ref 70–99)
Potassium: 4.5 mmol/L (ref 3.5–5.1)
Sodium: 141 mmol/L (ref 135–145)
Total Bilirubin: 0.6 mg/dL (ref 0.0–1.2)
Total Protein: 7.6 g/dL (ref 6.5–8.1)

## 2024-03-31 LAB — CBC WITH DIFFERENTIAL/PLATELET
Abs Immature Granulocytes: 0.02 K/uL (ref 0.00–0.07)
Basophils Absolute: 0 K/uL (ref 0.0–0.1)
Basophils Relative: 0 %
Eosinophils Absolute: 0 K/uL (ref 0.0–0.5)
Eosinophils Relative: 0 %
HCT: 40.9 % (ref 39.0–52.0)
Hemoglobin: 13.9 g/dL (ref 13.0–17.0)
Immature Granulocytes: 0 %
Lymphocytes Relative: 16 %
Lymphs Abs: 1 K/uL (ref 0.7–4.0)
MCH: 31.3 pg (ref 26.0–34.0)
MCHC: 34 g/dL (ref 30.0–36.0)
MCV: 92.1 fL (ref 80.0–100.0)
Monocytes Absolute: 0.5 K/uL (ref 0.1–1.0)
Monocytes Relative: 8 %
Neutro Abs: 4.9 K/uL (ref 1.7–7.7)
Neutrophils Relative %: 76 %
Platelets: 160 K/uL (ref 150–400)
RBC: 4.44 MIL/uL (ref 4.22–5.81)
RDW: 13.6 % (ref 11.5–15.5)
WBC: 6.5 K/uL (ref 4.0–10.5)
nRBC: 0 % (ref 0.0–0.2)

## 2024-03-31 LAB — PRO BRAIN NATRIURETIC PEPTIDE: Pro Brain Natriuretic Peptide: 117 pg/mL (ref <300.0)

## 2024-03-31 LAB — D-DIMER, QUANTITATIVE: D-Dimer, Quant: 0.87 ug{FEU}/mL — ABNORMAL HIGH (ref 0.00–0.50)

## 2024-03-31 LAB — CK: Total CK: 67 U/L (ref 49–397)

## 2024-03-31 LAB — TROPONIN T, HIGH SENSITIVITY
Troponin T High Sensitivity: 28 ng/L — ABNORMAL HIGH (ref 0–19)
Troponin T High Sensitivity: 22 ng/L — ABNORMAL HIGH (ref 0–19)

## 2024-03-31 MED ORDER — UPADACITINIB ER 30 MG PO TB24: 30.0000 mg | ORAL_TABLET | Freq: Every day | ORAL | Status: DC

## 2024-03-31 MED ORDER — ALBUTEROL SULFATE (2.5 MG/3ML) 0.083% IN NEBU: 2.5000 mg | INHALATION_SOLUTION | RESPIRATORY_TRACT | Status: DC | PRN

## 2024-03-31 MED ORDER — DULOXETINE HCL 30 MG PO CPEP
30.0000 mg | ORAL_CAPSULE | Freq: Every day | ORAL | Status: DC
  Administered 2024-04-01: 30 mg via ORAL
  Filled 2024-03-31: qty 1

## 2024-03-31 MED ORDER — COLESTIPOL HCL 1 G PO TABS
1.0000 g | ORAL_TABLET | Freq: Two times a day (BID) | ORAL | Status: DC
  Administered 2024-04-01 (×2): 1 g via ORAL
  Filled 2024-03-31 (×3): qty 1

## 2024-03-31 MED ORDER — MELATONIN 3 MG PO TABS
6.0000 mg | ORAL_TABLET | Freq: Every evening | ORAL | Status: DC | PRN
  Filled 2024-03-31: qty 2

## 2024-03-31 MED ORDER — SODIUM CHLORIDE 0.9% FLUSH
3.0000 mL | Freq: Two times a day (BID) | INTRAVENOUS | Status: DC
  Administered 2024-03-31 – 2024-04-01 (×2): 3 mL via INTRAVENOUS

## 2024-03-31 MED ORDER — DIPHENOXYLATE-ATROPINE 2.5-0.025 MG PO TABS
2.0000 | ORAL_TABLET | Freq: Four times a day (QID) | ORAL | Status: DC | PRN
  Administered 2024-03-31: 2 via ORAL
  Filled 2024-03-31: qty 2

## 2024-03-31 MED ORDER — PANTOPRAZOLE SODIUM 40 MG PO TBEC
40.0000 mg | DELAYED_RELEASE_TABLET | Freq: Every day | ORAL | Status: DC
  Administered 2024-04-01: 40 mg via ORAL
  Filled 2024-03-31: qty 1

## 2024-03-31 MED ORDER — SODIUM CHLORIDE 0.9 % IV SOLN: INTRAVENOUS | Status: DC

## 2024-03-31 MED ORDER — THIAMINE MONONITRATE 100 MG PO TABS
100.0000 mg | ORAL_TABLET | Freq: Every day | ORAL | Status: DC
  Administered 2024-04-01: 100 mg via ORAL
  Filled 2024-03-31: qty 1

## 2024-03-31 MED ORDER — TRAZODONE HCL 100 MG PO TABS
200.0000 mg | ORAL_TABLET | Freq: Every day | ORAL | Status: DC
Start: 2024-04-01 — End: 2024-04-02
  Administered 2024-03-31: 200 mg via ORAL
  Filled 2024-03-31: qty 2

## 2024-03-31 MED ORDER — ENOXAPARIN SODIUM 40 MG/0.4ML IJ SOSY
40.0000 mg | PREFILLED_SYRINGE | INTRAMUSCULAR | Status: DC
  Filled 2024-03-31: qty 0.4

## 2024-03-31 MED ORDER — LACTATED RINGERS IV SOLN: INTRAVENOUS | Status: DC

## 2024-03-31 MED ORDER — IOHEXOL 350 MG/ML SOLN
75.0000 mL | Freq: Once | INTRAVENOUS | Status: AC | PRN
  Administered 2024-03-31: 75 mL via INTRAVENOUS

## 2024-03-31 MED ORDER — ONDANSETRON HCL 4 MG/2ML IJ SOLN: 4.0000 mg | Freq: Four times a day (QID) | INTRAMUSCULAR | Status: DC | PRN

## 2024-03-31 MED ORDER — LACTATED RINGERS IV BOLUS: 1000.0000 mL | Freq: Once | INTRAVENOUS | Status: DC

## 2024-03-31 MED ORDER — HYDROCODONE-ACETAMINOPHEN 10-325 MG PO TABS
1.0000 | ORAL_TABLET | Freq: Four times a day (QID) | ORAL | Status: DC | PRN
  Administered 2024-03-31 – 2024-04-01 (×2): 1 via ORAL
  Filled 2024-03-31 (×2): qty 1

## 2024-03-31 MED ORDER — FINASTERIDE 5 MG PO TABS
5.0000 mg | ORAL_TABLET | Freq: Every day | ORAL | Status: DC
  Administered 2024-04-01: 5 mg via ORAL
  Filled 2024-03-31: qty 1

## 2024-03-31 MED ORDER — LACTATED RINGERS IV BOLUS
1000.0000 mL | Freq: Once | INTRAVENOUS | Status: AC
  Administered 2024-03-31: 1000 mL via INTRAVENOUS

## 2024-03-31 MED ORDER — POLYETHYLENE GLYCOL 3350 17 G PO PACK: 17.0000 g | PACK | Freq: Every day | ORAL | Status: DC | PRN

## 2024-03-31 MED ORDER — SODIUM CHLORIDE 0.9 % IV BOLUS
1000.0000 mL | Freq: Once | INTRAVENOUS | Status: AC
Start: 2024-03-31 — End: 2024-03-31
  Administered 2024-03-31: 1000 mL via INTRAVENOUS

## 2024-03-31 NOTE — ED Notes (Signed)
 Upon standing during orthostatic vital signs pt became dizzy. BP measured 76/55. RN notified

## 2024-03-31 NOTE — ED Notes (Signed)
 Called [facility] for transport

## 2024-03-31 NOTE — ED Notes (Signed)
 1 unsuccessful IV attempt, RT FA. Pt tolerated well.

## 2024-03-31 NOTE — Plan of Care (Addendum)
 [facility] emergency department to [facility] medical telemetry bed transfer observation status:   73 year old man past medical history of sick sinus syndrome status post PPM, bifascicular block, SVT, dilated aortic root, Crohn's disease, asthma, anxiety, chronic pain syndrome presented emergency department complaining of lightheadedness.  This has been progressively getting worse.. Primarily he feels lightheaded when he stands. He has not fallen or passed out. Does not really feel like the room spinning or unbalanced sensation. He is also been having a frontal and occipital headache/pressure during these last several weeks. Whenever he gets the lightheaded symptoms he also feels short of breath and has pressure in his chest. He denies any vomiting. No cough or fever though he did feel warm last night without taking his temperature. He recently had his antidepressant changed to Cymbalta  about 2 weeks ago but he was already having the symptoms prior to this. Symptoms seem to be getting worse and he was noticing some lightheadedness at rest today so he came to the ER. He has been feeling body aches for the last few weeks as well in all 4 extremities. He has chronic diarrhea but this is unchanged from baseline. No specific double vision or blurry vision though sometimes he will have some blurry vision when he has the lightheadedness.    At presentation to ED patient is hemodynamically stable.  Afebrile.  CT head no acute intracranial abnormality. Chest x-ray unremarkable. CTA chest ruled out PE.  Flat troponin 28 and 22.  Elevated D-dimer.  Normal proBNP. CBC and CMP unremarkable.  EKG showing normal sinus rhythm heart rate 60, right bundle branch block and fascicular block.  In the ED patient found orthostatic positive systolic blood pressure dropped 1 50-90 when patient stood up.  And continued to have dizziness.  AICD has been interrogated. In the ED patient received 1 L of LR bolus and currently  on LR 100 cc/h.  Hospitalist has been consulted for further evaluation management of persistent orthostatic hypotension with associated dizziness.  Given patient has complicated cardiac history requesting bed to [facility] and as there is no cardiac bed available requesting to medical telemetry bed.  Sherif Millspaugh, MD [facility] 03/31/2024, 8:22 PM

## 2024-03-31 NOTE — ED Triage Notes (Signed)
 Pt c/o dizziness x last couple weeks, especially w standing/ sitting up. States that issue used to resolve after a minute or two but now it's not. Advises near-falling several times, advises associated frontal/ occipital HA

## 2024-03-31 NOTE — ED Provider Notes (Signed)
 [place] AT [facility] Provider Note   CSN: [id number] Arrival date & time: 03/31/24  1424     Patient presents with: Dizziness   [name] is a 73 y.o. male.   HPI 73 year old male presents with a chief complaint of lightheadedness.  He states this has been ongoing but progressively worsening for 3-4 weeks.  Primarily he feels lightheaded when he stands.  He has not fallen or passed out.  Does not really feel like the room spinning or unbalanced sensation.  He is also been having a frontal and occipital headache/pressure during these last several weeks.  Whenever he gets the lightheaded symptoms he also feels short of breath and has pressure in his chest.  He denies any vomiting.  No cough or fever though he did feel warm last night without taking his temperature.  He recently had his antidepressant changed to Cymbalta  about 2 weeks ago but he was already having the symptoms prior to this.  Symptoms seem to be getting worse and he was noticing some lightheadedness at rest today so he came to the ER.  He has been feeling body aches for the last few weeks as well in all 4 extremities.  He has chronic diarrhea but this is unchanged from baseline.  No specific double vision or blurry vision though sometimes he will have some blurry vision when he has the lightheadedness.  Prior to Admission medications   Medication Sig Start Date End Date Taking? Authorizing Provider  acetaminophen  (TYLENOL ) 325 MG tablet Take 650 mg by mouth every 6 (six) hours as needed for moderate pain.    [provider]  albuterol  (VENTOLIN  HFA) 108 (90 Base) MCG/ACT inhaler Inhale 2 puffs into the lungs every 6 (six) hours as needed for wheezing or shortness of breath. 06/22/22   Gonfa, Taye T, MD  AMBULATORY NON FORMULARY MEDICATION Medication Name: nifedipine 0.2%/lidocaine  5% ointment 1:1 mix Apply 3-4 tmes/day into anal canal 01/23/23   Avram Lupita BRAVO, MD  ascorbic acid  (VITAMIN C) 250 MG CHEW Chew by mouth.    [provider]  calcium  carbonate (TUMS - DOSED IN MG ELEMENTAL CALCIUM ) 500 MG chewable tablet Chew 500 mg by mouth 3 (three) times daily as needed for indigestion or heartburn.    [provider]  Cholecalciferol (VITAMIN D ) 50 MCG (2000 UT) tablet Take 1 tablet (2,000 Units total) by mouth daily. 01/02/23   Avram Lupita BRAVO, MD  colestipol  (COLESTID ) 1 g tablet Take 1 g by mouth 2 (two) times daily.    [provider]  cyanocobalamin  (VITAMIN B12) 1000 MCG/ML injection Inject 1 mL (1,000 mcg total) into the muscle every 30 (thirty) days. 03/11/24   Avram Lupita BRAVO, MD  diphenoxylate -atropine  (LOMOTIL ) 2.5-0.025 MG tablet Take 2 tablets by mouth 4 (four) times daily as needed for diarrhea or loose stools. TAKE 2 TABLETS BY MOUTH FOUR TIMES DAILY AS NEEDED FOR DIARRHEA OR LOOSE STOOLS 11/21/23   Avram Lupita BRAVO, MD  DULoxetine  (CYMBALTA ) 30 MG capsule Take 1 capsule (30 mg total) by mouth daily. 03/19/24   Johnny Garnette LABOR, MD  ferrous sulfate  325 (65 FE) MG tablet Take 1 tablet (325 mg total) by mouth daily with breakfast. 10/10/22   Avram Lupita BRAVO, MD  finasteride  (PROSCAR ) 5 MG tablet Take 1 tablet (5 mg total) by mouth daily. 08/21/23   Johnny Garnette LABOR, MD  gabapentin  (NEURONTIN ) 100 MG capsule Take 1 capsule (100 mg total) by mouth at bedtime.  08/13/23   Johnny Garnette LABOR, MD  HYDROcodone -acetaminophen  (NORCO) 10-325 MG tablet Take 1 tablet by mouth every 6 (six) hours as needed for moderate pain (pain score 4-6). 03/19/24   Johnny Garnette LABOR, MD  HYDROcodone -acetaminophen  (NORCO) 10-325 MG tablet Take 1 tablet by mouth every 6 (six) hours as needed for moderate pain (pain score 4-6). 03/19/24   Johnny Garnette LABOR, MD  HYDROcodone -acetaminophen  (NORCO) 10-325 MG tablet Take 1 tablet by mouth every 6 (six) hours as needed for moderate pain (pain score 4-6). 03/19/24   Johnny Garnette LABOR, MD  hyoscyamine  (LEVSIN  SL) 0.125 MG SL tablet Place 0.125 mg under the  tongue every 6 (six) hours as needed for cramping.    [provider]  magnesium  oxide (MAG-OX) 400 MG tablet Take 1 tablet (400 mg total) by mouth 2 (two) times daily. 02/18/23   Avram Lupita BRAVO, MD  melatonin 3 MG TABS tablet Take 3 mg by mouth at bedtime.    [provider]  Multiple Vitamin (MULTIVITAMIN WITH MINERALS) TABS tablet Take 1 tablet by mouth daily.    [provider]  ondansetron  (ZOFRAN -ODT) 4 MG disintegrating tablet Take 1 tablet (4 mg total) by mouth every 8 (eight) hours as needed for nausea or vomiting. 02/21/24   Dreama Longs, MD  oxybutynin  (DITROPAN -XL) 5 MG 24 hr tablet Take 5 mg by mouth daily. 09/19/23   [provider]  phenazopyridine  (PYRIDIUM ) 200 MG tablet TAKE 1 TABLET(200 MG) BY MOUTH THREE TIMES DAILY AS NEEDED FOR URINARY PAIN 08/14/23   Johnny Garnette LABOR, MD  Potassium Chloride  ER 20 MEQ TBCR TAKE 1 TABLET(20 MEQ) BY MOUTH TWICE DAILY 05/31/23   Avram Lupita BRAVO, MD  simethicone  (MYLICON) 80 MG chewable tablet Chew 80 mg by mouth 4 (four) times daily -  with meals and at bedtime.    [provider]  thiamine  (VITAMIN B1) 100 MG tablet Take 1 tablet (100 mg total) by mouth daily. 10/10/22   Avram Lupita BRAVO, MD  traZODone  (DESYREL ) 100 MG tablet Take 2 tablets (200 mg total) by mouth at bedtime. 01/31/24   Johnny Garnette LABOR, MD  Upadacitinib  ER (RINVOQ ) 30 MG TB24 Take 1 tablet (30 mg total) by mouth daily. 09/07/23   Avram Lupita BRAVO, MD  valACYclovir  (VALTREX ) 500 MG tablet Take 1 tablet (500 mg total) by mouth 3 (three) times daily as needed (for shingles). 01/25/24   Johnny Garnette LABOR, MD    Allergies: Purinethol  [mercaptopurine ], Shellfish allergy, Humira  [adalimumab ], Tape, and Wound dressing adhesive    Review of Systems  Eyes:  Positive for visual disturbance.  Respiratory:  Positive for shortness of breath. Negative for cough.   Cardiovascular:  Positive for chest pain.  Gastrointestinal:  Positive for diarrhea. Negative for  abdominal pain and vomiting.  Neurological:  Positive for light-headedness and headaches. Negative for speech difficulty, weakness and numbness.    Updated Vital Signs BP (!) 123/90   Pulse 60   Temp 97.8 F (36.6 C) (Oral)   Resp 17   SpO2 100%   Physical Exam Vitals and nursing note reviewed.  Constitutional:      General: He is not in acute distress.    Appearance: He is well-developed. He is not ill-appearing or diaphoretic.  HENT:     Head: Normocephalic and atraumatic.  Eyes:     Extraocular Movements: Extraocular movements intact.     Pupils: Pupils are equal, round, and reactive to light.  Cardiovascular:     Rate and  Rhythm: Normal rate and regular rhythm.     Heart sounds: Normal heart sounds.  Pulmonary:     Effort: Pulmonary effort is normal.     Breath sounds: Normal breath sounds.  Abdominal:     General: There is no distension.     Palpations: Abdomen is soft.     Tenderness: There is no abdominal tenderness.  Musculoskeletal:     Cervical back: Normal range of motion. No rigidity.     Right lower leg: No edema.     Left lower leg: No edema.  Skin:    General: Skin is warm and dry.  Neurological:     Mental Status: He is alert.     Comments: CN 3-12 grossly intact. 5/5 strength in all 4 extremities. Grossly normal sensation. Normal finger to nose.      (all labs ordered are listed, but only abnormal results are displayed) Labs Reviewed  URINALYSIS, ROUTINE W REFLEX MICROSCOPIC - Abnormal; Notable for the following components:      Result Value   pH 8.5 (*)    Protein, ur TRACE (*)    All other components within normal limits  COMPREHENSIVE METABOLIC PANEL WITH GFR - Abnormal; Notable for the following components:   Calcium  10.5 (*)    All other components within normal limits  D-DIMER, QUANTITATIVE - Abnormal; Notable for the following components:   D-Dimer, Quant 0.87 (*)    All other components within normal limits  TROPONIN T, HIGH  SENSITIVITY - Abnormal; Notable for the following components:   Troponin T High Sensitivity 28 (*)    All other components within normal limits  TROPONIN T, HIGH SENSITIVITY - Abnormal; Notable for the following components:   Troponin T High Sensitivity 22 (*)    All other components within normal limits  CK  PRO BRAIN NATRIURETIC PEPTIDE  CBC WITH DIFFERENTIAL/PLATELET  CBC WITH DIFFERENTIAL/PLATELET  CBC WITH DIFFERENTIAL/PLATELET  CBG MONITORING, ED    EKG: EKG Interpretation Date/Time:  Monday March 31 2024 14:36:43 EDT Ventricular Rate:  60 PR Interval:  178 QRS Duration:  134 QT Interval:  429 QTC Calculation: 429 R Axis:   -58  Text Interpretation: Sinus rhythm RBBB and LAFB Left ventricular hypertrophy ATRIAL PACED RHYTHM Confirmed by Geraldene Hamilton [phone]) on 03/31/2024 3:07:53 PM  Radiology: CT Angio Chest PE W and/or Wo Contrast Result Date: 03/31/2024 CLINICAL DATA:  Pulmonary embolism (PE) suspected, low to intermediate prob, positive D-dimer Chest pain. EXAM: CT ANGIOGRAPHY CHEST WITH CONTRAST TECHNIQUE: Multidetector CT imaging of the chest was performed using the standard protocol during bolus administration of intravenous contrast. Multiplanar CT image reconstructions and MIPs were obtained to evaluate the vascular anatomy. RADIATION DOSE REDUCTION: This exam was performed according to the departmental dose-optimization program which includes automated exposure control, adjustment of the mA and/or kV according to patient size and/or use of iterative reconstruction technique. CONTRAST:  75mL OMNIPAQUE  IOHEXOL  350 MG/ML SOLN COMPARISON:  Radiograph earlier today.  Chest CT 05/12/2022 FINDINGS: Cardiovascular: There are no filling defects within the pulmonary arteries to suggest pulmonary embolus. The heart is normal in size. The thoracic aorta is normal in caliber. Mild aortic atherosclerosis. No contrast in the aorta to assess for aortic abnormality. 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 There are coronary artery calcifications. No pericardial effusion. Mediastinum/Nodes: No enlarged mediastinal or hilar lymph nodes. Small partially calcified mediastinal nodes are typical of prior granulomatous disease. No visible thyroid  nodule. Small hiatal hernia with mild wall thickening of the distal esophagus. Lungs/Pleura:  Unchanged subpleural nodular opacity in the right lower lobe since 2023, series 10, image 91, consistent with scarring. 6 mm right middle lobe nodule series 10, image 17, appears slightly angular and may be associated with the fissure, however new from prior exam. No pleural effusion. No features of pulmonary edema. Upper Abdomen: No acute findings. Enteric sutures noted within bowel loops in the right abdomen. Musculoskeletal: Thoracic spondylosis. There are no acute or suspicious osseous abnormalities. Review of the MIP images confirms the above findings. IMPRESSION: 1. No pulmonary embolus or acute intrathoracic abnormality. 2. A 6 mm right middle lobe nodule appears slightly angular and may be associated with the fissure, however new from prior exam. Favor intrapulmonary lymph node, however recommend 6 month follow-up CT to ensure stability. 3. Nodular density in the right lower lobe is unchanged from 2023 and consistent with scarring. 4. Small hiatal hernia with mild wall thickening of the distal esophagus, can be seen with reflux or esophagitis. Aortic Atherosclerosis (ICD10-I70.0). Electronically Signed   By: Andrea Gasman M.D.   On: 03/31/2024 18:03   CT Head Wo Contrast Result Date: 03/31/2024 CLINICAL DATA:  Headache. EXAM: CT HEAD WITHOUT CONTRAST TECHNIQUE: Contiguous axial images were obtained from the base of the skull through the vertex without intravenous contrast. RADIATION DOSE REDUCTION: This exam was performed according to the departmental dose-optimization program which includes automated exposure control, adjustment of the mA and/or kV  according to patient size and/or use of iterative reconstruction technique. COMPARISON:  May 15, 2022 FINDINGS: Brain: There is generalized cerebral atrophy with widening of the extra-axial spaces and ventricular dilatation. There are areas of decreased attenuation within the white matter tracts of the supratentorial brain, consistent with microvascular disease changes. Vascular: Moderate to marked severity bilateral cavernous carotid artery calcification is noted. Skull: Normal. Negative for fracture or focal lesion. Sinuses/Orbits: No acute finding. Other: None. IMPRESSION: 1. Generalized cerebral atrophy and microvascular disease changes of the supratentorial brain. 2. No acute intracranial abnormality. Electronically Signed   By: Suzen Dials M.D.   On: 03/31/2024 16:40   DG Chest 2 View Result Date: 03/31/2024 CLINICAL DATA:  Chest pain and dyspnea.  Dizziness. EXAM: CHEST - 2 VIEW COMPARISON:  08/07/2023 FINDINGS: The heart size and mediastinal contours are within normal limits. Pacemaker remains in appropriate position. Both lungs are clear. The visualized skeletal structures are unremarkable. IMPRESSION: No active cardiopulmonary disease. Electronically Signed   By: Norleen DELENA Kil M.D.   On: 03/31/2024 16:40     Procedures   Medications Ordered in the ED  sodium chloride  0.9 % bolus 1,000 mL (has no administration in time range)  0.9 %  sodium chloride  infusion (has no administration in time range)  lactated ringers  bolus 1,000 mL (0 mLs Intravenous Stopped 03/31/24 1835)  iohexol  (OMNIPAQUE ) 350 MG/ML injection 75 mL (75 mLs Intravenous Contrast Given 03/31/24 1730)                                    Medical Decision Making Amount and/or Complexity of Data Reviewed Labs: ordered.    Details: Troponins minimally elevated but flat Radiology: ordered and independent interpretation performed.    Details: No PE ECG/medicine tests: ordered and independent interpretation performed.     Details: no ischemia  Risk Prescription drug management. Decision regarding hospitalization.   Patient's cause of his symptoms appear to be orthostatic hypotension.  Workup was obtained which shows no evidence of  head bleed or mass, PE, or MI.  Does have some minimally low elevated troponins that are flat.  I doubt this is ACS.  However when he did orthostatics, his blood pressure went from around 150 lying to 76 standing.  He became quite dizzy.  He is not on any hypertension medicines.  He has already been given a liter of fluid, will give some more fluids but I think he will need admission for further workup and monitoring.  Seems to be a fall risk due to this.  Unclear cause, but at this point is otherwise stable after the orthostatics and stable for a medical admission.  Discussed with Dr. Sundil.     Final diagnoses:  Orthostatic hypotension    ED Discharge Orders     None          Freddi Hamilton, MD 03/31/24 2153

## 2024-03-31 NOTE — Progress Notes (Signed)
   03/31/24 2300  Orthostatic Lying   BP- Lying 136/75  Pulse- Lying 63  Orthostatic Sitting  BP- Sitting 137/72  Pulse- Sitting 70  Orthostatic Standing at 0 minutes  BP- Standing at 0 minutes 114/65  Pulse- Standing at 0 minutes 64  Orthostatic Standing at 3 minutes  BP- Standing at 3 minutes 117/62  Pulse- Standing at 3 minutes 70

## 2024-03-31 NOTE — H&P (Signed)
 History and Physical    [name] FMW:[id number] DOB: 12/13/1950 DOA: 03/31/2024  PCP: Johnny Garnette LABOR, MD   Patient coming from: Tx from [facility] ED   Chief Complaint:  Chief Complaint  Patient presents with   Dizziness    HPI:  [name] is a 73 y.o. male with hx of sick sinus syndrome status post St Jude dual chamber PPM '22, bifascicular block, SVT, dilated aortic root, Crohn's disease, hx diverticulitis + L hemicolectomy c/b leak and hx ileostomy + reversal, CKD2, asthma, anxiety, chronic pain syndrome presented with lightheadedness; transferred from [facility] ED for orthostatic hypotension.   He reports that over the past 4 weeks he has been feeling lightheaded during transitions, which has gradually worsened with time.  Due to progression of his symptoms presented to the ED.  He attributes this to decreased appetite and decreased p.o. intake over the past 4 weeks.  Reports he has lost about 5 pounds over this timeframe.  In the larger picture had significant weight loss over the past 2 years and was previously on TPN although not recently.  He has chronic diarrhea and endorses having 10 episodes per day which she states is unchanged from his typical and continues despite use of Lomotil .  He has chronic pelvic and abdominal pain which she also states is unchanged.  Yesterday thought he may have a fever although this seems to have improved.  Denies any dysphagia, odynophagia, significant nausea, or vomiting.  He has not been on an appetite stimulant in the past and would like to think about this before starting.  Denies depression related to decreased oral intake   And otherwise does report when been ambulatory that he is experiencing chest pain and shortness of breath.    Review of Systems:  ROS complete and negative except as marked above   Allergies  Allergen Reactions   Purinethol  [Mercaptopurine ] Other (See Comments)    Pancreatitis    Shellfish Allergy Anaphylaxis and  Swelling    Can use standard SMOF lipid formulation for TPN without any issue.    Humira  [place].Core ] Other (See Comments)    Developed antibodies   Tape Rash   Wound Dressing Adhesive Rash    Prior to Admission medications   Medication Sig Start Date End Date Taking? Authorizing Provider  acetaminophen  (TYLENOL ) 325 MG tablet Take 650 mg by mouth every 6 (six) hours as needed for moderate pain.    [provider]  albuterol  (VENTOLIN  HFA) 108 (90 Base) MCG/ACT inhaler Inhale 2 puffs into the lungs every 6 (six) hours as needed for wheezing or shortness of breath. 06/22/22   Gonfa, Taye T, MD  AMBULATORY NON FORMULARY MEDICATION Medication Name: nifedipine 0.2%/lidocaine  5% ointment 1:1 mix Apply 3-4 tmes/day into anal canal 01/23/23   Avram Lupita BRAVO, MD  ascorbic acid (VITAMIN C) 250 MG CHEW Chew by mouth.    [provider]  calcium  carbonate (TUMS - DOSED IN MG ELEMENTAL CALCIUM ) 500 MG chewable tablet Chew 500 mg by mouth 3 (three) times daily as needed for indigestion or heartburn.    [provider]  Cholecalciferol (VITAMIN D ) 50 MCG (2000 UT) tablet Take 1 tablet (2,000 Units total) by mouth daily. 01/02/23   Avram Lupita BRAVO, MD  colestipol  (COLESTID ) 1 g tablet Take 1 g by mouth 2 (two) times daily.    [provider]  cyanocobalamin  (VITAMIN B12) 1000 MCG/ML injection Inject 1 mL (1,000 mcg total) into the muscle every 30 (thirty) days.  03/11/24   Avram Lupita BRAVO, MD  diphenoxylate -atropine  (LOMOTIL ) 2.5-0.025 MG tablet Take 2 tablets by mouth 4 (four) times daily as needed for diarrhea or loose stools. TAKE 2 TABLETS BY MOUTH FOUR TIMES DAILY AS NEEDED FOR DIARRHEA OR LOOSE STOOLS 11/21/23   Avram Lupita BRAVO, MD  DULoxetine  (CYMBALTA ) 30 MG capsule Take 1 capsule (30 mg total) by mouth daily. 03/19/24   Johnny Garnette LABOR, MD  ferrous sulfate  325 (65 FE) MG tablet Take 1 tablet (325 mg total) by mouth daily with breakfast. 10/10/22   Avram Lupita BRAVO, MD   finasteride  (PROSCAR ) 5 MG tablet Take 1 tablet (5 mg total) by mouth daily. 08/21/23   Johnny Garnette LABOR, MD  gabapentin  (NEURONTIN ) 100 MG capsule Take 1 capsule (100 mg total) by mouth at bedtime. 08/13/23   Johnny Garnette LABOR, MD  HYDROcodone -acetaminophen  (NORCO) 10-325 MG tablet Take 1 tablet by mouth every 6 (six) hours as needed for moderate pain (pain score 4-6). 03/19/24   Johnny Garnette LABOR, MD  HYDROcodone -acetaminophen  (NORCO) 10-325 MG tablet Take 1 tablet by mouth every 6 (six) hours as needed for moderate pain (pain score 4-6). 03/19/24   Johnny Garnette LABOR, MD  HYDROcodone -acetaminophen  (NORCO) 10-325 MG tablet Take 1 tablet by mouth every 6 (six) hours as needed for moderate pain (pain score 4-6). 03/19/24   Johnny Garnette LABOR, MD  hyoscyamine  (LEVSIN  SL) 0.125 MG SL tablet Place 0.125 mg under the tongue every 6 (six) hours as needed for cramping.    [provider]  magnesium  oxide (MAG-OX) 400 MG tablet Take 1 tablet (400 mg total) by mouth 2 (two) times daily. 02/18/23   Avram Lupita BRAVO, MD  melatonin 3 MG TABS tablet Take 3 mg by mouth at bedtime.    [provider]  Multiple Vitamin (MULTIVITAMIN WITH MINERALS) TABS tablet Take 1 tablet by mouth daily.    [provider]  ondansetron  (ZOFRAN -ODT) 4 MG disintegrating tablet Take 1 tablet (4 mg total) by mouth every 8 (eight) hours as needed for nausea or vomiting. 02/21/24   Dreama Longs, MD  oxybutynin  (DITROPAN -XL) 5 MG 24 hr tablet Take 5 mg by mouth daily. 09/19/23   [provider]  phenazopyridine  (PYRIDIUM ) 200 MG tablet TAKE 1 TABLET(200 MG) BY MOUTH THREE TIMES DAILY AS NEEDED FOR URINARY PAIN 08/14/23   Johnny Garnette LABOR, MD  Potassium Chloride  ER 20 MEQ TBCR TAKE 1 TABLET(20 MEQ) BY MOUTH TWICE DAILY 05/31/23   Avram Lupita BRAVO, MD  simethicone  (MYLICON) 80 MG chewable tablet Chew 80 mg by mouth 4 (four) times daily -  with meals and at bedtime.    [provider]  thiamine  (VITAMIN B1) 100 MG tablet Take 1  tablet (100 mg total) by mouth daily. 10/10/22   Avram Lupita BRAVO, MD  traZODone  (DESYREL ) 100 MG tablet Take 2 tablets (200 mg total) by mouth at bedtime. 01/31/24   Johnny Garnette LABOR, MD  Upadacitinib  ER (RINVOQ ) 30 MG TB24 Take 1 tablet (30 mg total) by mouth daily. 09/07/23   Avram Lupita BRAVO, MD  valACYclovir  (VALTREX ) 500 MG tablet Take 1 tablet (500 mg total) by mouth 3 (three) times daily as needed (for shingles). 01/25/24   Johnny Garnette LABOR, MD    Past Medical History:  Diagnosis Date   Acute prostatitis 07/24/2007   Qualifier: Diagnosis of  By: Johnny MD, Garnette A    Acute respiratory failure with hypoxia (HCC)    Allergy    mild  Arthritis    osteoarthritis   Aspiration pneumonia (HCC) 05/24/2022   Asthma    Blood transfusion without reported diagnosis    BPH (benign prostatic hypertrophy) with urinary obstruction    Crohn's ileitis (HCC) suspected 05/03/2017   Dilated aortic root (HCC)    noted on echo 08/2012   Diverticulitis of colon    Drug-induced acute pancreatitis without infection or necrosis - from 6 MP 09/24/2017   EPIDIDYMITIS 02/15/2010   Qualifier: Diagnosis of  By: Johnny MD, Garnette LABOR    GERD (gastroesophageal reflux disease)    H/O: GI bleed    Hemorrhoids    Hepatitis 1975   unknown type    HERPES SIMPLEX INFECTION 10/14/2007   Qualifier: Diagnosis of  By: Johnny MD, Garnette A    Hiatal hernia    Ileus following gastrointestinal surgery (HCC) 12/26/2011   Long term (current) use of systemic steroids 06/18/2018   Psoriasis    sees Dr. Donnice Sims at [facility].   Rectal fissure    Recurrent ventral incisional hernia 05/10/2012   Shingles    Short bowel syndrome    SVT (supraventricular tachycardia) (HCC)    Ulcer 08/21/2013   ileal    Past Surgical History:  Procedure Laterality Date   BOWEL RESECTION  12/19/2011   Procedure: SMALL BOWEL RESECTION;  Surgeon: Morene ONEIDA Olives, MD;  Location: [facility];  Service: General;  Laterality: N/A;  with anastamosis  and insertion mesh   BRONCHOSCOPY     COLON SURGERY  01/2004   x 2   COLONOSCOPY N/A 01/29/2024   Procedure: COLONOSCOPY;  Surgeon: Avram Lupita BRAVO, MD;  Location: [facility];  Service: Gastroenterology;  Laterality: N/A;   COLONOSCOPY W/ BIOPSIES  04/26/2017   per Dr. Avram, no polyps, benign inflammation, repeat in 5 yrs    CYSTOSCOPY     ESOPHAGOGASTRODUODENOSCOPY     HEMICOLECTOMY     left side, at [facility], diverticulitis   HEMORRHOID BANDING     HERNIA REPAIR     [phone] incisional hernia   ILEOSTOMY     ILEOSTOMY CLOSURE     INSERTION OF MESH  07/31/2012   Procedure: INSERTION OF MESH;  Surgeon: Morene ONEIDA Olives, MD;  Location: [facility];  Service: General;;   LAPAROTOMY  12/19/2011   Procedure: EXPLORATORY LAPAROTOMY;  Surgeon: Morene ONEIDA Olives, MD;  Location: [facility];  Service: General;  Laterality: N/A;   PACEMAKER IMPLANT N/A 12/01/2020   Procedure: PACEMAKER IMPLANT;  Surgeon: Inocencio Soyla Lunger, MD;  Location: [facility];  Service: Cardiovascular;  Laterality: N/A;   PACEMAKER INSERTION Left    TONSILLECTOMY     UPPER GASTROINTESTINAL ENDOSCOPY     VASECTOMY     VENTRAL HERNIA REPAIR  07/31/2012   Procedure: HERNIA REPAIR VENTRAL ADULT;  Surgeon: Morene ONEIDA Olives, MD;  Location: [facility];  Service: General;  Laterality: N/A;     reports that he has never smoked. He has never used smokeless tobacco. He reports that he does not currently use alcohol. He reports current drug use. Drug: Oxycodone .  Family History  Problem Relation Age of Onset   Lung cancer Mother    Leukemia Father    Hypertension Father    Heart disease Father    Heart attack Father    Prostate cancer Father    Prostate cancer Paternal Uncle    Colon cancer Neg Hx    Stomach cancer Neg Hx    Colon polyps Neg Hx  Esophageal cancer Neg Hx    Rectal cancer Neg Hx      Physical Exam: Vitals:   03/31/24 1957 03/31/24 1958 03/31/24 1959 03/31/24 2153  BP:    (!) 147/87  Pulse:  60 61 60 (!) 59  Resp: 14 16 17 17   Temp:    97.6 F (36.4 C)  TempSrc:    Oral  SpO2: 100% 100% 100% 100%    Gen: Awake, alert, chronically ill-appearing, malnourished CV: Regular, normal S1, S2, no murmurs  Resp: Normal WOB, CTAB  Abd: Scaphoid normoactive, nontender MSK: Symmetric, no edema  Skin: No rashes or lesions to exposed skin  Neuro: Alert and interactive  Psych: euthymic, appropriate    Data review:   Labs reviewed, notable for:   Ca 10.5  pBNP wnl  HS trop 28 -> 22  Blood counts unremarkable D-dimer 0.87  Micro:  Results for orders placed or performed during the hospital encounter of 02/21/24  Urine Culture     Status: Abnormal   Collection Time: 02/21/24  8:04 AM   Specimen: Urine, Clean Catch  Result Value Ref Range Status   Specimen Description   Final    URINE, CLEAN CATCH Performed at [facility], [address] [place], [place] [address]    Special Requests   Final    NONE Performed at [facility], [address] [place], [place] [address]    Culture (A)  Final    <10,000 COLONIES/mL INSIGNIFICANT GROWTH Performed at [facility], [address] [place], [place] [address]    Report Status 02/22/2024 FINAL  Final    Imaging reviewed:  CT Angio Chest PE W and/or Wo Contrast Result Date: 03/31/2024 CLINICAL DATA:  Pulmonary embolism (PE) suspected, low to intermediate prob, positive D-dimer Chest pain. EXAM: CT ANGIOGRAPHY CHEST WITH CONTRAST TECHNIQUE: Multidetector CT imaging of the chest was performed using the standard protocol during bolus administration of intravenous contrast. Multiplanar CT image reconstructions and MIPs were obtained to evaluate the vascular anatomy. RADIATION DOSE REDUCTION: This exam was performed according to the departmental dose-optimization program which includes automated exposure control, adjustment of the mA and/or kV according to patient size and/or use of  iterative reconstruction technique. CONTRAST:  75mL OMNIPAQUE  IOHEXOL  350 MG/ML SOLN COMPARISON:  Radiograph earlier today.  Chest CT 05/12/2022 FINDINGS: Cardiovascular: There are no filling defects within the pulmonary arteries to suggest pulmonary embolus. The heart is normal in size. The thoracic aorta is normal in caliber. Mild aortic atherosclerosis. No contrast in the aorta to assess for aortic abnormality. Left-sided pacemaker in place. There are coronary artery calcifications. No pericardial effusion. Mediastinum/Nodes: No enlarged mediastinal or hilar lymph nodes. Small partially calcified mediastinal nodes are typical of prior granulomatous disease. No visible thyroid  nodule. Small hiatal hernia with mild wall thickening of the distal esophagus. Lungs/Pleura: Unchanged subpleural nodular opacity in the right lower lobe since 2023, series 10, image 91, consistent with scarring. 6 mm right middle lobe nodule series 10, image 17, appears slightly angular and may be associated with the fissure, however new from prior exam. No pleural effusion. No features of pulmonary edema. Upper Abdomen: No acute findings. Enteric sutures noted within bowel loops in the right abdomen. Musculoskeletal: Thoracic spondylosis. There are no acute or suspicious osseous abnormalities. Review of the MIP images confirms the above findings. IMPRESSION: 1. No pulmonary embolus or acute intrathoracic abnormality. 2. A 6 mm right middle lobe nodule appears slightly angular and may be associated with the  fissure, however new from prior exam. Favor intrapulmonary lymph node, however recommend 6 month follow-up CT to ensure stability. 3. Nodular density in the right lower lobe is unchanged from 2023 and consistent with scarring. 4. Small hiatal hernia with mild wall thickening of the distal esophagus, can be seen with reflux or esophagitis. Aortic Atherosclerosis (ICD10-I70.0). Electronically Signed   By: Andrea Gasman M.D.   On:  03/31/2024 18:03   CT Head Wo Contrast Result Date: 03/31/2024 CLINICAL DATA:  Headache. EXAM: CT HEAD WITHOUT CONTRAST TECHNIQUE: Contiguous axial images were obtained from the base of the skull through the vertex without intravenous contrast. RADIATION DOSE REDUCTION: This exam was performed according to the departmental dose-optimization program which includes automated exposure control, adjustment of the mA and/or kV according to patient size and/or use of iterative reconstruction technique. COMPARISON:  May 15, 2022 FINDINGS: Brain: There is generalized cerebral atrophy with widening of the extra-axial spaces and ventricular dilatation. There are areas of decreased attenuation within the white matter tracts of the supratentorial brain, consistent with microvascular disease changes. Vascular: Moderate to marked severity bilateral cavernous carotid artery calcification is noted. Skull: Normal. Negative for fracture or focal lesion. Sinuses/Orbits: No acute finding. Other: None. IMPRESSION: 1. Generalized cerebral atrophy and microvascular disease changes of the supratentorial brain. 2. No acute intracranial abnormality. Electronically Signed   By: Suzen Dials M.D.   On: 03/31/2024 16:40   DG Chest 2 View Result Date: 03/31/2024 CLINICAL DATA:  Chest pain and dyspnea.  Dizziness. EXAM: CHEST - 2 VIEW COMPARISON:  08/07/2023 FINDINGS: The heart size and mediastinal contours are within normal limits. Pacemaker remains in appropriate position. Both lungs are clear. The visualized skeletal structures are unremarkable. IMPRESSION: No active cardiopulmonary disease. Electronically Signed   By: Norleen DELENA Kil M.D.   On: 03/31/2024 16:40    EKG:  Personally reviewed, sinus rhythm, LAD, LAE, LVH, borderline LAFB, RBBB no acute ischemic changes  ED Course:  PPM interrogated  Treated with 1 L LR and started on rate at 100 cc an hour Initial orthostatics after 1 L LR + repeat after arrival      Assessment/Plan:  73 y.o. male with hx sick sinus syndrome status post St Jude dual chamber PPM '22, bifascicular block, SVT, dilated aortic root, Crohn's disease, hx diverticulitis + L hemicolectomy c/b leak and hx ileostomy + reversal, CKD2, asthma, anxiety, chronic pain syndrome presented with lightheadedness; transferred from [facility] ED for orthostatic hypotension.   Orthostatic hypotension Decreased appetite / Anorexia  Mild protein calorie malnutrition  See SNF above, dropping to 70s over 50s with standing, improved on repeat although remains borderline positive without hypotensive pressures.  Likely related to hypovolemia considering his improvement, Had recently noted anorexia ? Related to depression on OP visit.  -Given additional 1 L of NS and continue on NS at 100 cc an hour -Serial orthostatics -Telemetry monitoring -Nutrition, PT  evaluation -- Consider for appetite stimulant if he is interested.   Acute myocardial injury.  Reports recent exertional symptoms with chest pain and shortness of breath.  EKG nonischemic. HS trop flat 28 -> 22. Likely demand in setting of hypovolemia / hypotension while upright. -- Management directed at above. Check lipids and A1c.  - He should have follow-up with cardiology outpatient for ischemic evaluation  + Ddimer Ddimer 0.87; borderline for age adjusted. CTA negative for PE  -- Doubt VTE, ok to hold off on Duplex LE   Incidental findings: 6 mm right middle lobe nodule: recommend 6  month follow-up CT Nodular density in the right lower lobe: is unchanged from 2023 and consistent with scarring. Small hiatal hernia, with mild wall thickening of the distal esophagus, Likely esophagitis: Start on PPI daily.   Chronic medical problems: Sick sinus syndrome s/p pacemaker, currently A-paced: PPM interrogation requested at OSH ED but was not completed; RN contacted rep and this will be interrogated in the morning.  Bifascicular block: Noted on  EKG Hx SVT: remote Hx. On tele.  Aortic ectasia: OP surveillance  Crohns disease: Follows with Dr. Avram. on Rinvoq  daily; may continue. Colestipol  for bile salt diarrhea. Lomotil  prn. Reports chronic diarrhea + abd pain unchanged, hold on stool studies or abd imaging for now; recent CT last month without active crohns.  CKD stage II: Baseline Cr ~ 1.2  Hx of Vit deficiencies: On B12 IM OP. Thiamine  PO  Asthma: Nebs prn  Anxiety: Continue home Buspar , Trazodone  at bedtime  Chronic pain: Continue home Percocet QID prn, Duloxetine    There is no height or weight on file to calculate BMI.    DVT prophylaxis:  Lovenox  Code Status:  Full Code Diet:  Diet Orders (From admission, onward)     Start     Ordered   03/31/24 2203  Diet regular Room service appropriate? Yes; Fluid consistency: Thin  Diet effective now       Question Answer Comment  Room service appropriate? Yes   Fluid consistency: Thin      03/31/24 2202           Family Communication:  None   Consults:  None   Admission status:   Observation, Telemetry bed  Severity of Illness: The appropriate patient status for this patient is OBSERVATION. Observation status is judged to be reasonable and necessary in order to provide the required intensity of service to ensure the patient's safety. The patient's presenting symptoms, physical exam findings, and initial radiographic and laboratory data in the context of their medical condition is felt to place them at decreased risk for further clinical deterioration. Furthermore, it is anticipated that the patient will be medically stable for discharge from the hospital within 2 midnights of admission.    Dorn Dawson, MD [facility]  How to contact the [facility] Attending or Consulting provider 7A - 7P or covering provider during after hours 7P -7A, for this patient.  Check the care team in [facility] and look for a) attending/consulting [facility] provider listed and b) the [facility] team  listed Log into [email or web address] and use [place]'s universal password to access. If you do not have the password, please contact the hospital operator. Locate the [facility] provider you are looking for under [facility] and page to a number that you can be directly reached. If you still have difficulty reaching the provider, please page the [facility] (Director on Call) for the Hospitalists listed on amion for assistance.  03/31/2024, 11:15 PM

## 2024-04-01 ENCOUNTER — Observation Stay (HOSPITAL_COMMUNITY): Admitting: Certified Registered Nurse Anesthetist

## 2024-04-01 DIAGNOSIS — K529 Noninfective gastroenteritis and colitis, unspecified: Secondary | ICD-10-CM | POA: Insufficient documentation

## 2024-04-01 DIAGNOSIS — R7989 Other specified abnormal findings of blood chemistry: Secondary | ICD-10-CM | POA: Insufficient documentation

## 2024-04-01 DIAGNOSIS — I951 Orthostatic hypotension: Secondary | ICD-10-CM | POA: Diagnosis not present

## 2024-04-01 DIAGNOSIS — R630 Anorexia: Secondary | ICD-10-CM | POA: Insufficient documentation

## 2024-04-01 LAB — MAGNESIUM: Magnesium: 1.8 mg/dL (ref 1.7–2.4)

## 2024-04-01 LAB — BASIC METABOLIC PANEL WITH GFR
Anion gap: 8 (ref 5–15)
BUN: 10 mg/dL (ref 8–23)
CO2: 25 mmol/L (ref 22–32)
Calcium: 8.3 mg/dL — ABNORMAL LOW (ref 8.9–10.3)
Chloride: 105 mmol/L (ref 98–111)
Creatinine, Ser: 1.07 mg/dL (ref 0.61–1.24)
GFR, Estimated: >60 mL/min (ref >60)
Glucose, Bld: 90 mg/dL (ref 70–99)
Potassium: 4.2 mmol/L (ref 3.5–5.1)
Sodium: 138 mmol/L (ref 135–145)

## 2024-04-01 LAB — CBC
HCT: 32.3 % — ABNORMAL LOW (ref 39.0–52.0)
Hemoglobin: 10.8 g/dL — ABNORMAL LOW (ref 13.0–17.0)
MCH: 31.3 pg (ref 26.0–34.0)
MCHC: 33.4 g/dL (ref 30.0–36.0)
MCV: 93.6 fL (ref 80.0–100.0)
Platelets: 136 K/uL — ABNORMAL LOW (ref 150–400)
RBC: 3.45 MIL/uL — ABNORMAL LOW (ref 4.22–5.81)
RDW: 13.7 % (ref 11.5–15.5)
WBC: 5 K/uL (ref 4.0–10.5)
nRBC: 0 % (ref 0.0–0.2)

## 2024-04-01 LAB — LIPID PANEL
Cholesterol: 86 mg/dL (ref 0–200)
HDL: 29 mg/dL — ABNORMAL LOW (ref >40)
LDL Cholesterol: 17 mg/dL (ref 0–99)
Total CHOL/HDL Ratio: 3 ratio
Triglycerides: 200 mg/dL — ABNORMAL HIGH (ref <150)
VLDL: 40 mg/dL (ref 0–40)

## 2024-04-01 LAB — PHOSPHORUS: Phosphorus: 3 mg/dL (ref 2.5–4.6)

## 2024-04-01 LAB — TSH: TSH: 0.948 u[IU]/mL (ref 0.350–4.500)

## 2024-04-01 LAB — HEMOGLOBIN A1C
Hgb A1c MFr Bld: 4.1 % — ABNORMAL LOW (ref 4.8–5.6)
Mean Plasma Glucose: 70.97 mg/dL

## 2024-04-01 MED ORDER — ADULT MULTIVITAMIN W/MINERALS CH: 1.0000 | ORAL_TABLET | Freq: Every day | ORAL | Status: DC

## 2024-04-01 MED ORDER — FERROUS SULFATE 325 (65 FE) MG PO TABS: 325.0000 mg | ORAL_TABLET | Freq: Every day | ORAL | Status: DC

## 2024-04-01 MED ORDER — SODIUM CHLORIDE 0.9 % IV BOLUS
500.0000 mL | Freq: Once | INTRAVENOUS | Status: AC
  Administered 2024-04-01: 500 mL via INTRAVENOUS

## 2024-04-01 MED ORDER — ALUM & MAG HYDROXIDE-SIMETH 200-200-20 MG/5ML PO SUSP: 15.0000 mL | Freq: Four times a day (QID) | ORAL | Status: DC | PRN

## 2024-04-01 MED ORDER — BANATROL TF EN LIQD: 60.0000 mL | Freq: Two times a day (BID) | ENTERAL | Status: DC

## 2024-04-01 MED ORDER — FAMOTIDINE 20 MG PO TABS: 20.0000 mg | ORAL_TABLET | Freq: Every day | ORAL | Status: DC

## 2024-04-01 MED ORDER — VITAMIN B-12 1000 MCG PO TABS
1000.0000 ug | ORAL_TABLET | Freq: Every day | ORAL | Status: DC
  Administered 2024-04-01: 1000 ug via ORAL
  Filled 2024-04-01: qty 1

## 2024-04-02 LAB — GLUCOSE, CAPILLARY: Glucose-Capillary: 68 mg/dL — ABNORMAL LOW (ref 70–99)

## 2024-04-03 ENCOUNTER — Telehealth: Payer: Self-pay | Admitting: Family Medicine

## 2024-04-03 ENCOUNTER — Telehealth: Payer: Self-pay | Admitting: Internal Medicine

## 2024-04-03 NOTE — Telephone Encounter (Signed)
 Copied from CRM [id number]. Topic: General - Deceased Patient >> 04-11-2024  9:36 AM [place] J wrote: Name of caller: Woodie Pollock  Date of death: 09-Apr-2024   Patient's wife is calling in to ensure that Dr. Johnny is aware of the patient's death.

## 2024-04-03 NOTE — Telephone Encounter (Signed)
FYI Dr. Gessner 

## 2024-04-03 NOTE — Telephone Encounter (Signed)
 Received a call from patients spouse regarding patient. States she wanted to let Dr. Avram and his team know of Mr. Domanski recent passing. Please review.  Thank you

## 2024-04-16 NOTE — Anesthesia Procedure Notes (Signed)
 Procedure Name: Intubation Date/Time: Apr 30, 2024 12:40 PM  Performed by: Vertie Arthea RAMAN, CRNAPre-anesthesia Checklist: Suction available and Emergency Drugs available Oxygen Delivery Method: Ambu bag Preoxygenation: Pre-oxygenation with 100% oxygen Ventilation: Two handed mask ventilation required Laryngoscope Size: Glidescope and 4 Grade View: Grade I Tube type: Oral Tube size: 8.0 mm Number of attempts: 2 Airway Equipment and Method: Video-laryngoscopy and Bougie stylet Placement Confirmation: ETT inserted through vocal cords under direct vision, positive ETCO2, breath sounds checked- equal and bilateral and CO2 detector Tube secured with: Tape Comments: DL x 1 by RT at [place], DLx 2 by Z. Dantonio Justen, CRNA one attempt grade I view +EtCO2. Cuff leak noticed, ETT exchanged over bougie stylet, Positive EtCO2 followed by frank red blood. Verified correct placement via DL with Cleotilde 3 by Erma, MD frank blood coming from ETT.during chest compressions.

## 2024-04-16 NOTE — Progress Notes (Signed)
 Pt declined by medical examiner

## 2024-04-16 NOTE — Plan of Care (Signed)

## 2024-04-16 NOTE — Death Summary Note (Signed)
 DEATH SUMMARY   Patient Details  Name: [name] MRN: [id number] DOB: 03-22-1951 PCP:Fry, Garnette LABOR, MD Admission/Discharge Information   Admit Date:  04/27/24  Date of Death: Date of Death: April 28, 2024  Time of Death: Time of Death: 05/04/50  Length of Stay: 0   Hospital Diagnoses: Principal Problem:   Orthostatic hypotension Active Problems:   Anorexia   Elevated troponin   Chronic diarrhea   Hospital Course: [name] was a 73 y.o. male with PMH significant for SSS s/p dual-chamber PPM 2021-05-04, bifascicular block, SVT, dilated aortic root, chron's disease, h/o diverticulitis, h/o left hemicolectomy c/b leak and ileostomy status post reversal, CKD, chronic pain syndrome, anxiety 04-27-2024, patient presented to ED at [facility] with complaint of dizziness especially on standing/sitting up in the last couple weeks.  Has had several near falling episodes, and associated frontal/occipital headache Of note, patient has noted progressive decline over the last 2 years with loss of appetite, chronic diarrhea, weight loss.  Previously used to be on TPN as well.  Denies any dysphagia, odynophagia, nausea, vomiting.   In the ED, patient was noted to have orthostatic blood pressure drop on standing up. Labs with CBC unremarkable, CMP unremarkable except for calcium  elevated to 10.5, troponin mildly elevated to 28 Urinalysis showed clear yellow urine without evidence of infection CT head showed generalized cerebral atrophy and microvascular disease CT angio chest showed a new 6 mm right middle lobe nodule and an unchanged nodular density in the right lower lobe since 05/04/2022 consistent with scarring.  Patient was admitted to hospitalist service.  I evaluated the patient this morning. Patient was sitting up in recliner, comfortable.  Not in supplemental oxygen. History reviewed and detailed as above. Additionally, stated that he lives at home with his wife and does not need any assistive device  to ambulate. Dietitian joined the conversation.  We discussed about his chronic nutritional deficiencies and also other potential remedies to help his chronic diarrhea.   After few hours, this afternoon, around 12:30 pm, I was paged with a message that patient has Coded. I attended immediately.  At that time, internal medicine residents was running the code per ACLS protocol.  Anesthesia was trying to intubate him. Per report from bedside nurse, central tele called the nurse at 12:16 PM and mentioned that patient is in asystole.  When he went to check, patient was unresponsive.  He called code blue.  CPR was started about 12:25 pm.  I was paged shortly afterwards.  CODE BLUE was conducted per protocol Patient received 7 doses of epinephrine , intubation, 1 shock but rhythm remained asystole throughout.  Blood was seen coming out of the ET tube.  While the code was not progressed, I called and spoke to patient's wife who understandably was in significant distress and could not make a decision whether to continue or stop.  At her request, I called patient's son Mr. [name].  He is an EMT and lives in [place] .  I answered his questions to satisfaction.  He was able to understand the complexities of the situation and the fact that patient has not shown any rhythm despite 26 minutes of chest compression, multiple rounds of epinephrine  and a shock. He thanked the medical team and gave us  permission to stop the code. Patient was pronounced at 12:51 PM.  Patient has a pacemaker in place. Pacemaker interrogation was done.  Cause of death: Sudden cardiac death    Assessment and Plan: No notes have  been filed under this hospital service. Service: Hospitalist        The results of significant diagnostics from this hospitalization (including imaging, microbiology, ancillary and laboratory) are listed below for reference.   Significant Diagnostic Studies: CT Angio Chest PE W  and/or Wo Contrast Result Date: 03/31/2024 CLINICAL DATA:  Pulmonary embolism (PE) suspected, low to intermediate prob, positive D-dimer Chest pain. EXAM: CT ANGIOGRAPHY CHEST WITH CONTRAST TECHNIQUE: Multidetector CT imaging of the chest was performed using the standard protocol during bolus administration of intravenous contrast. Multiplanar CT image reconstructions and MIPs were obtained to evaluate the vascular anatomy. RADIATION DOSE REDUCTION: This exam was performed according to the departmental dose-optimization program which includes automated exposure control, adjustment of the mA and/or kV according to patient size and/or use of iterative reconstruction technique. CONTRAST:  75mL OMNIPAQUE  IOHEXOL  350 MG/ML SOLN COMPARISON:  Radiograph earlier today.  Chest CT 05/12/2022 FINDINGS: Cardiovascular: There are no filling defects within the pulmonary arteries to suggest pulmonary embolus. The heart is normal in size. The thoracic aorta is normal in caliber. Mild aortic atherosclerosis. No contrast in the aorta to assess for aortic abnormality. Left-sided pacemaker in place. There are coronary artery calcifications. No pericardial effusion. Mediastinum/Nodes: No enlarged mediastinal or hilar lymph nodes. Small partially calcified mediastinal nodes are typical of prior granulomatous disease. No visible thyroid  nodule. Small hiatal hernia with mild wall thickening of the distal esophagus. Lungs/Pleura: Unchanged subpleural nodular opacity in the right lower lobe since 2023, series 10, image 91, consistent with scarring. 6 mm right middle lobe nodule series 10, image 17, appears slightly angular and may be associated with the fissure, however new from prior exam. No pleural effusion. No features of pulmonary edema. Upper Abdomen: No acute findings. Enteric sutures noted within bowel loops in the right abdomen. Musculoskeletal: Thoracic spondylosis. There are no acute or suspicious osseous abnormalities. Review of  the MIP images confirms the above findings. IMPRESSION: 1. No pulmonary embolus or acute intrathoracic abnormality. 2. A 6 mm right middle lobe nodule appears slightly angular and may be associated with the fissure, however new from prior exam. Favor intrapulmonary lymph node, however recommend 6 month follow-up CT to ensure stability. 3. Nodular density in the right lower lobe is unchanged from 2023 and consistent with scarring. 4. Small hiatal hernia with mild wall thickening of the distal esophagus, can be seen with reflux or esophagitis. Aortic Atherosclerosis (ICD10-I70.0). Electronically Signed   By: Andrea Gasman M.D.   On: 03/31/2024 18:03   CT Head Wo Contrast Result Date: 03/31/2024 CLINICAL DATA:  Headache. EXAM: CT HEAD WITHOUT CONTRAST TECHNIQUE: Contiguous axial images were obtained from the base of the skull through the vertex without intravenous contrast. RADIATION DOSE REDUCTION: This exam was performed according to the departmental dose-optimization program which includes automated exposure control, adjustment of the mA and/or kV according to patient size and/or use of iterative reconstruction technique. COMPARISON:  May 15, 2022 FINDINGS: Brain: There is generalized cerebral atrophy with widening of the extra-axial spaces and ventricular dilatation. There are areas of decreased attenuation within the white matter tracts of the supratentorial brain, consistent with microvascular disease changes. Vascular: Moderate to marked severity bilateral cavernous carotid artery calcification is noted. Skull: Normal. Negative for fracture or focal lesion. Sinuses/Orbits: No acute finding. Other: None. IMPRESSION: 1. Generalized cerebral atrophy and microvascular disease changes of the supratentorial brain. 2. No acute intracranial abnormality. Electronically Signed   By: Suzen Dials M.D.   On: 03/31/2024 16:40   DG Chest  2 View Result Date: 03/31/2024 CLINICAL DATA:  Chest pain and dyspnea.   Dizziness. EXAM: CHEST - 2 VIEW COMPARISON:  08/07/2023 FINDINGS: The heart size and mediastinal contours are within normal limits. Pacemaker remains in appropriate position. Both lungs are clear. The visualized skeletal structures are unremarkable. IMPRESSION: No active cardiopulmonary disease. Electronically Signed   By: Norleen DELENA Kil M.D.   On: 03/31/2024 16:40   CUP PACEART REMOTE DEVICE CHECK Result Date: 03/20/2024 Pacemaker: Scheduled remote reviewed. Normal device function.  Presenting rhythm: AP/VS & AP/VP 3 AMS, longest 66 sec, EGMs c/w SR/ST & competitive atrial pacing Next remote 91 days. ML, CVRS   Microbiology: No results found for this or any previous visit (from the past 240 hours).  Time spent: 45 minutes  Signed: Chapman Rota, MD 06-Apr-2024

## 2024-04-16 NOTE — Progress Notes (Signed)
 Pacemaker interrogation done by [employer] rep this evening. Still waiting on pt's son to visit

## 2024-04-16 NOTE — Hospital Course (Addendum)
 [name] was a 73 y.o. male with PMH significant for SSS s/p dual-chamber PPM 2022, bifascicular block, SVT, dilated aortic root, chron's disease, h/o diverticulitis, h/o left hemicolectomy c/b leak and ileostomy status post reversal, CKD, chronic pain syndrome, anxiety 9/15, patient presented to ED at [facility] with complaint of dizziness especially on standing/sitting up in the last couple weeks.  Has had several near falling episodes, and associated frontal/occipital headache Of note, patient has noted progressive decline over the last 2 years with loss of appetite, chronic diarrhea, weight loss.  Previously used to be on TPN as well.  Denies any dysphagia, odynophagia, nausea, vomiting.   In the ED, patient was noted to have orthostatic blood pressure drop on standing up. Labs with CBC unremarkable, CMP unremarkable except for calcium  elevated to 10.5, troponin mildly elevated to 28 Urinalysis showed clear yellow urine without evidence of infection CT head showed generalized cerebral atrophy and microvascular disease CT angio chest showed a new 6 mm right middle lobe nodule and an unchanged nodular density in the right lower lobe since 2023 consistent with scarring.  Patient was admitted to hospitalist service.  I evaluated the patient this morning. Patient was sitting up in recliner, comfortable.  Not in supplemental oxygen. History reviewed and detailed as above. Additionally, stated that he lives at home with his wife and does not need any assistive device to ambulate. Dietitian statin conversation.  We discussed about his chronic nutritional deficiencies and also other potential remedies to help his chronic diarrhea.   After few hours, this afternoon, around 12:30 pm, I was paged with a message that patient has Coded. I attended immediately.  At that time, resident service was running the code per ACLS protocol.  Anesthesia was trying to intubate him. Per report from bedside  nurse, central tele called the nurse at 12:16 PM and mentioned that patient is in asystole. He went to check immediately and found him unresponsive.  He called code blue.  CPR was started about 12:20 pm.  I was paged shortly afterwards.  CODE BLUE was conducted per protocol Patient received 7 doses of epinephrine , intubation, 1 shock but rhythm remained asystole throughout.  Blood was seen coming out of the ET tube.  While the code was not progressed, I called and spoke to patient's wife who understandably was in significant distress and could not make a decision whether to continue or stop.  At her request, I called patient's son Mr. [name].  He is an EMT and lives in [place] .  I answered his questions to satisfaction.  He was able to understand the complexities of the situation and the fact that patient has not shown any rhythm despite 26 minutes of chest compression, multiple rounds of epinephrine  and a shock. He thanked the medical team and gave us  permission to stop the code. Patient was pronounced at 12:51 PM.  Patient has a pacemaker in place.  I have asked charge nurse as well as Publishing copy to call for pacemaker interrogation.

## 2024-04-16 NOTE — Code Documentation (Signed)
  Patient Name: [name]   MRN: [id number]   Date of Birth/ Sex: [birth year] , male      Admission Date: 03/31/2024  Attending Provider: Arlice Reichert, MD  Primary Diagnosis: Orthostatic hypotension [I95.1]   Indication: Pt was in his usual state of health until this PM, when he was noted to be sitting in chair, unresponsive. Code blue was subsequently called. At the time of arrival on scene, ACLS protocol was underway.   Technical Description:  - CPR performance duration:  20 minutes  - Was defibrillation or cardioversion used? Yes   - Was external pacer placed? No  - Was patient intubated pre/post CPR? Yes   Medications Administered: Y = Yes; Blank = No Amiodarone   Y  Atropine     Calcium     Epinephrine   Y  Lidocaine     Magnesium     Norepinephrine     Phenylephrine    Sodium bicarbonate   Y  Vasopressin    Other    Post CPR evaluation:  - Final Status - Was patient successfully resuscitated ? No   Miscellaneous Information:  - Time of death:  12:51 PM  - Primary team notified?  Yes  - Family Notified? Chaney Waymond Cart, MD   25-Apr-2024, 1:16 PM

## 2024-04-16 NOTE — Progress Notes (Signed)
 Received a call from remote cardiac monitor around 12:16hrs about pt being in asystole as reporter was attending to another patient request. Reporter went to check on the patient and found him in the chair and did a quick assessment which showed that the patient was unresponsive. Reporting nurse immediately called the code team as well as for help from the team comprising floor nurses and nurse techs and immediately moved the patient from the chair onto the bed to begin chest compressions. CPR was initiated around 1220hrs.

## 2024-04-16 NOTE — Progress Notes (Signed)
   15-Apr-2024 1844  Spiritual Encounters  Type of Visit Initial  Care provided to: Family  Conversation partners present during encounter Nurse  Referral source Trauma page  Reason for visit Patient death  OnCall Visit Yes   Responded to patient death. Family at bedside. Son in route. Proved spiritual care and prayer.

## 2024-04-16 NOTE — Progress Notes (Signed)
 Initial Nutrition Assessment  DOCUMENTATION CODES:   Non-severe (moderate) malnutrition in context of chronic illness  INTERVENTION:   Encourage PO intake - Currently on regular diet with thin liquids Small frequent snacks Protein with every meal Focus on soluble fiber to thicken stool  Avoid spicy, greasy food, and lactose if not able to tolerate Banatrol BID-provides 45kcal, 5g soluble fiber and 2g protein per serving. Carnation Breakfast Essentials TID, each packet mixed with 8 ounces of 2% milk provides 13 grams of protein and 260 calories. Ensure Plus High Protein po daily, each supplement provides 350 kcal and 20 grams of protein MVI with minerals daily  100 mg Thiamine  daily Provided IBD nutrition therapy education and handout IBD nut edu  *Has shellfish allergy   NUTRITION DIAGNOSIS:   Moderate Malnutrition related to chronic illness as evidenced by severe muscle depletion, moderate fat depletion, energy intake < 75% for > or equal to 1 month, per patient/family report.  GOAL:   Patient will meet greater than or equal to 90% of their needs   MONITOR:   PO intake, Supplement acceptance, Labs, I & O's  REASON FOR ASSESSMENT:   Consult Assessment of nutrition requirement/status  ASSESSMENT:  73 y.o. male with hx of sick sinus syndrome status post [employer] dual chamber PPM '22, bifascicular block, SVT, dilated aortic root, Crohn's disease, hx diverticulitis + L hemicolectomy c/b leak and hx ileostomy + reversal, [facility]BR, chronic diarrhea and abdominal pain, CKD2, asthma, anxiety, presented with lightheadedness; admitted for orthostatic hypotension.  9/15 - Admitted   History obtained through pt and chart review.   In 2003 pt was first diagnosed with diverticulitis. He reports that he required a sigmoid colectomy for diverticulitis in 2005 at [facility]. He says that he had a temporary ileostomy that was later taken down. In 2006 he had a ventral hernia repaired with mesh. He  did well for a while with episodes of diarrhea and constipation. in 2013 he reports that he had an incarcerated ventral hernia and required a small bowel resection. Following the surgery he recovered and was again having some problems with diarrhea and some constipation.   In 2018 he had more persistent diarrhea and underwent evaluation and was diagnosed with Crohn's ileitis.   In 2023 he had a small bowel obstruction and underwent exploration with lysis of adhesions, removal of mesh, ileocolic resection, and end ileostomy. The pathology report was consistent with Crohn's disease. 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 He required IV fluids and TPN. In 2024 he underwent takedown of the ileostomy. He has lived with constant daily abdominal pain/pelvic pain with chronic diarrhea with 10 episodes of loose stools per day.   Pt reports he saw an RD at [facility] in 2022 about his Chorns disease and has been following a low fiber diet since then. Pt very knowledgeable about diet and IBD already. Pt reports he has been gaining weight in the last year with his UBW of 165 lbs. He has been feeling lightheaded and dizzy in the past 4 weeks and also reports decreased appetite and po intake. New weight shows pt has lost 2 lbs in 4 weeks. Not clinically significant but is concerning given overall clinical picture. On exam pt with severe muscle and moderate fat wasting. Pt reports muscle wasting in lower extremities is due to being bedbound for a year while in the hospital. Was walking PTA. Pt reports his decreased appetite and PO intake are  related to his depression, recommended following with an outpatient therapist and possibility of adding an appetite stimulant that helps with both depression and appetite.   Pt still with multiple loose bowel movements even with following an IBD diet. He is not able to pinpoint what foods exactly cause him to  use the bathroom but does try to avoid lactose, fiber, and spicy food. Provided IBD diet education and handout focusing on increasing soluble fiber to help with loose BM. Encouraged small frequent snacks/meals and adequate fluid intake. Pt willing to try Banatrol. Has had Ensure during last hospitalization and did not tolerate, willing to try 1 Ensure per day and Carnation Instant breakfast with meals.   Had 100% of his french toast, bacon, and orange juice for breakfast today. Has not had a bowel movement yet today. Pt reports his GI doctor recently  switched him to a different medication that has helped lessen stool output. Pt reports he has an appointment with an IBD RD in February 2026.   Dietary recall PTA:  Breakfast: Croissant breakfast sandwich with bacon, sausage, cheese, and egg, and fruit Lunch: Ham or [place] sandwich on white bread Dinner: Pasta or rice with a baked or grilled protein Snacks: animal crackers, doritos,  pita chips with dip occasionally  Drinks: water , OJ, cherry juice, giner ale, almond milk, soy milk    Admit weight: 73.9 kg Current weight: 73.9 kg  Last Weight  Most recent update: 04/27/2024  8:59 AM    Weight  73.9 kg (163 lb)            UBW: 165 lbs   Average Meal Intake: 04-27-24: 100% intake x 1 recorded meals  Nutritionally Relevant Medications: Scheduled Meds:  colestipol   1 g Oral BID   vitamin B-12  1,000 mcg Oral Daily   DULoxetine   30 mg Oral Daily   enoxaparin  (LOVENOX ) injection  40 mg Subcutaneous Q24H   famotidine   20 mg Oral QAC breakfast   [START ON 04/02/2024] ferrous sulfate   325 mg Oral Q breakfast   fiber supplement (BANATROL TF)  60 mL Oral BID   finasteride   5 mg Oral Daily   multivitamin with minerals  1 tablet Oral Daily   sodium chloride  flush  3 mL Intravenous Q12H   thiamine   100 mg Oral Daily   traZODone   200 mg Oral QHS   Upadacitinib  ER  30 mg Oral Daily   Continuous Infusions:  sodium chloride  100 mL/hr at 27-Apr-2024 0906    Labs Reviewed: CBG ranges from 80-90 mg/dL over the last 24 hours HgbA1c 4.1  NUTRITION - FOCUSED PHYSICAL EXAM:  Flowsheet Row Most Recent Value  Orbital Region Mild depletion  Upper Arm Region Moderate depletion  Thoracic and Lumbar Region No depletion  Buccal Region Mild depletion  Temple Region Mild depletion  Clavicle Bone Region Moderate depletion  Clavicle and Acromion Bone Region Moderate depletion  Scapular Bone Region Moderate depletion  Dorsal Hand Moderate depletion  Patellar Region Severe depletion  Anterior Thigh Region Severe depletion  Posterior Calf Region Severe depletion  Edema (RD Assessment) None  Hair Reviewed  Eyes Reviewed  Mouth Reviewed  [Missing some teeth]  Skin Reviewed  Nails Reviewed    Diet Order:   Diet Order             Diet regular Room service appropriate? Yes with Assist; Fluid consistency: Thin  Diet effective now  EDUCATION NEEDS:   Education needs have been addressed  Skin:  Skin Assessment: Reviewed RN Assessment  Last BM:  9/15  Height:   Ht Readings from Last 1 Encounters:  2024-04-18 5' 10 (1.778 m)    Weight:   Wt Readings from Last 1 Encounters:  April 18, 2024 73.9 kg    Ideal Body Weight:  75.5 kg  BMI:  Body mass index is 23.39 kg/m.  Estimated Nutritional Needs:   Kcal:  1900-2100 kcal  Protein:  90-110 gm  Fluid:  >1.9L/day   Olivia Kenning, RD Registered Dietitian  See Amion for more information

## 2024-04-16 NOTE — Progress Notes (Incomplete)
 PROGRESS NOTE  [name]  DOB: 04-27-1951  PCP: Johnny Garnette LABOR, MD FMW:[id number]  DOA: 03/31/2024  LOS: 0 days  Hospital Day: 2  Brief narrative: [name] is a 73 y.o. male with PMH significant for SSS s/p dual-chamber PPM 2022, bifascicular block, SVT, dilated aortic root, chron's disease, h/o diverticulitis, h/o left hemicolectomy c/b leak and ileostomy status post reversal, CKD, chronic pain syndrome, anxiety 9/15, patient presented to ED at [facility] with complaint of dizziness especially on standing/sitting up in the last couple weeks.  Has had several near falling episodes, and associated frontal/occipital headache Of note, patient has noted progressive decline over the last 2 years with loss of appetite, chronic diarrhea, weight loss.  Previously used to be on TPN as well.  Denies any dysphagia, odynophagia, nausea, vomiting.  In the ED, patient was noted to have orthostatic blood pressure drop on standing up. Labs with CBC unremarkable, CMP unremarkable except for calcium  elevated to 10.5, troponin mildly elevated to 28 Urinalysis showed clear yellow urine without evidence of infection CT head showed generalized cerebral atrophy and microvascular disease CT angio chest showed a new 6 mm right middle lobe nodule and an unchanged nodular density in the right lower lobe since 2023 consistent with scarring.  Subjective: Patient was seen and examined *** Chart reviewed. In the last 24 hours, afebrile, heart rate in 60s, blood pressure was low in 80s last night, up to 100s this morning, breathing room air Labs from this morning showed WBC 5, hemoglobin 10.8, platelet 136, HDL 29  Assessment and plan: Orthostatic hypotension and near syncope Presented with weeks of dizziness especially on standing up, near syncope associated with poor appetite, chronic diarrhea, weight loss  Noted to have orthostatic hypotension Currently IV hydration, telemetry monitoring Nutrition  consult*** Appetite stimulant***  Chronic diarrhea H/o Crohn's disease Reports chronic diarrhea.  Follows with Dr. Avram.  It seems he had CT abdomen and pelvis with contrast on 02/21/2024 which did not show any issue with hepatobiliary, pancreatic or bowels. Continue Rinvoq  daily, colestipol , Lomotil  as needed as before Micro nutrition deficiencies due to chronic diarrhea to be addressed by nutritionist  Elevated troponin Mildly elevated troponin in the setting of STEMI hypertension.  Probably demand ischemia Consider ischemic evaluation as an outpatient.  SSS s/p PPM Currently atrial paced Pacemaker interrogation***  Aortic ectasia Outpatient surveillance  Anxiety Continue Buspar , Trazodone  at bedtime   Chronic pain Continue Cymbalta  30 mg daily, Neurontin  100 mg nightly, Percocet QID prn  6 mm right middle lobe nodule recommend 6 month follow-up CT  Reflux esophagitis/hiatal hernia PPI started***  BPH Proscar   Chronic vitamin B12 deficiency Chronic iron deficiency Continue supplements Recent Labs    06/26/23 1426 08/07/23 1647 09/07/23 1215 11/06/23 1508 12/12/23 1459 02/21/24 0616 03/31/24 1539 April 28, 2024 0320  HGB 12.9*   < > 13.5  13.1  WILL FOLLOW  --  12.6* 13.1 13.9 10.8*  MCV 97.5   < > 94.3  97  WILL FOLLOW  --  93.1 94.0 92.1 93.6  VITAMINB12  --   --   --  148*  --   --   --   --   FERRITIN 109.0  --   --   --   --   --   --   --    < > = values in this interval not displayed.      Mobility: *** PT Orders: Active   PT Follow up Rec:  ***  Goals of  care   Code Status: Full Code  ***   DVT prophylaxis: *** enoxaparin  (LOVENOX ) injection 40 mg Start: 04/10/24 1000   Antimicrobials: *** Fluid: *** Consultants: *** Family Communication: ***  Status: *** Level of care:  Telemetry Medical   Patient is from: *** Needs to continue in-hospital care: *** Anticipated d/c to: ***      Diet: *** Diet Order             Diet  regular Room service appropriate? Yes; Fluid consistency: Thin  Diet effective now                   Scheduled Meds:  colestipol   1 g Oral BID   DULoxetine   30 mg Oral Daily   enoxaparin  (LOVENOX ) injection  40 mg Subcutaneous Q24H   finasteride   5 mg Oral Daily   pantoprazole   40 mg Oral Q0600   sodium chloride  flush  3 mL Intravenous Q12H   thiamine   100 mg Oral Daily   traZODone   200 mg Oral QHS   Upadacitinib  ER  30 mg Oral Daily    PRN meds: albuterol , diphenoxylate -atropine , HYDROcodone -acetaminophen , melatonin, ondansetron  (ZOFRAN ) IV, polyethylene glycol   Infusions:   sodium chloride  100 mL/hr at 03/31/24 2225    Antimicrobials: Anti-infectives (From admission, onward)    None       Objective: Vitals:   10-Apr-2024 0520 2024/04/10 0804  BP: 126/68 106/68  Pulse:  60  Resp:  18  Temp:  97.6 F (36.4 C)  SpO2:  99%    Intake/Output Summary (Last 24 hours) at 2024/04/10 0824 Last data filed at 10-Apr-2024 0229 Gross per 24 hour  Intake 2063.71 ml  Output 200 ml  Net 1863.71 ml   There were no vitals filed for this visit. Weight change:  There is no height or weight on file to calculate BMI.   Physical Exam: General exam: Pleasant, *** Skin: No rashes, lesions or ulcers. HEENT: Atraumatic, normocephalic, no obvious bleeding Lungs: Clear to auscultation bilaterally, *** CVS: S1, S2, no murmur,  *** GI/Abd: Soft, nontender, nondistended, bowel sound present,  *** CNS: *** Psychiatry: *** Extremities: No pedal edema, no calf tenderness, ***  Data Review: I have personally reviewed the laboratory data and studies available.  F/u labs *** Unresulted Labs (From admission, onward)     Start     Ordered   03/31/24 1512  CBC WITH DIFFERENTIAL  Once,   STAT        03/31/24 1512   03/31/24 1512  CBC with Differential  Once,   STAT        03/31/24 1512            Signed, Chapman Rota, MD [facility] 04-10-2024

## 2024-04-16 NOTE — Progress Notes (Signed)
 NM called provider and it went to the office. Spoke to [employer] and informed her about the patient coding. Katie was paging provider Dr.Dahal Chapman.

## 2024-04-16 NOTE — Evaluation (Signed)
 Physical Therapy Evaluation Patient Details Name: [name] MRN: [id number] DOB: May 07, 1951 Today's Date: 04-03-24  History of Present Illness  Pt is 73 yo male who presents on 03/31/24 with dizziness with changes in position resulting in several falls.  + HA. No acute intracranial process on imaging. Of note, PCP note 03/19/24 mentioned increased pain and depression with med change to Cymbalta . PMH: SSS s/p dual chamber PPM, SVT, Crohn's, CKD2, asthma, anxiety, depression, chronic pain syndrome  Clinical Impression  Pt admitted with above diagnosis. Pt from home with wife, has been caring for self, walks his dog daily. Eval limited by orthostatic hypotension. Pt stood but BP dropped to 54/39 so pt returned to supine and placed in trendelenberg with BP return to 121/68. Pt did tolerate low squat pivot to recliner with feet up immediately and head back. Final BP 106/63. Will advance mobility as tolerated. Likely no PT recs at d/c if BP issues resolve.  Pt currently with functional limitations due to the deficits listed below (see PT Problem List). Pt will benefit from acute skilled PT to increase their independence and safety with mobility to allow discharge.     BP before session 95/ 58 (supine) Sitting 82/59 Standing 54/39 Trend 121/68 Recliner 92/65 Recliner at 3 mins 106/63      If plan is discharge home, recommend the following: A lot of help with walking and/or transfers;Assist for transportation;Help with stairs or ramp for entrance;Assistance with cooking/housework   Can travel by private vehicle        Equipment Recommendations Other (comment) (TBD)  Recommendations for Other Services       Functional Status Assessment Patient has had a recent decline in their functional status and demonstrates the ability to make significant improvements in function in a reasonable and predictable amount of time.     Precautions / Restrictions Precautions Precautions: Fall Recall of  Precautions/Restrictions: Intact Precaution/Restrictions Comments: OH Restrictions Weight Bearing Restrictions Per Provider Order: No      Mobility  Bed Mobility Overal bed mobility: Modified Independent             General bed mobility comments: no difficulty coming to EOB or returning to supine. Had pt exercise in supine before sitting up and then exercise UE's and LE's in sitting, in an attempt to get BP up but was not effective    Transfers Overall transfer level: Needs assistance Equipment used: None Transfers: Sit to/from Stand, Bed to chair/wheelchair/BSC Sit to Stand: Contact guard assist     Squat pivot transfers: Contact guard assist     General transfer comment: CGA for safety due to hypotension. Pt performed low squat transfer to reclined chair and feet lifted immediately. Tolerated well    Ambulation/Gait               General Gait Details: did not test due to hypotension  Stairs            Wheelchair Mobility     Tilt Bed    Modified Rankin (Stroke Patients Only)       Balance Overall balance assessment: Needs assistance Sitting-balance support: Feet supported, No upper extremity supported Sitting balance-Leahy Scale: Normal     Standing balance support: No upper extremity supported Standing balance-Leahy Scale: Fair Standing balance comment: dizziness in standing                             Pertinent Vitals/Pain Pain Assessment Pain Assessment:  Faces Faces Pain Scale: Hurts little more Pain Location: chronic pelvic pain Pain Descriptors / Indicators: Discomfort Pain Intervention(s): Limited activity within patient's tolerance, Monitored during session    Home Living Family/patient expects to be discharged to:: Private residence Living Arrangements: Spouse/significant other Available Help at Discharge: Family;Available 24 hours/day Type of Home: House Home Access: Stairs to enter Entrance Stairs-Rails:  None Entrance Stairs-Number of Steps: 2 Alternate Level Stairs-Number of Steps: flight Home Layout: 1/2 bath on main level;Two level Home Equipment: None Additional Comments: lives with wife but she has RA, does not drive and cannot help him physically    Prior Function Prior Level of Function : Independent/Modified Independent             Mobility Comments: walks his dog, Rosie (therapy dog), daily ADLs Comments: independent. Usually has ~10 bowel mvmts/ day with his Crohn's, notes that freq has decreased past few days     Extremity/Trunk Assessment   Upper Extremity Assessment Upper Extremity Assessment: Overall WFL for tasks assessed    Lower Extremity Assessment Lower Extremity Assessment: Overall WFL for tasks assessed    Cervical / Trunk Assessment Cervical / Trunk Assessment: Normal  Communication   Communication Communication: No apparent difficulties    Cognition Arousal: Alert Behavior During Therapy: WFL for tasks assessed/performed   PT - Cognitive impairments: No apparent impairments                         Following commands: Intact       Cueing Cueing Techniques: Verbal cues     General Comments      Exercises     Assessment/Plan    PT Assessment Patient needs continued PT services  PT Problem List Decreased activity tolerance;Decreased mobility       PT Treatment Interventions DME instruction;Gait training;Stair training;Functional mobility training;Therapeutic exercise;Therapeutic activities;Balance training;Patient/family education    PT Goals (Current goals can be found in the Care Plan section)  Acute Rehab PT Goals Patient Stated Goal: get BP stable PT Goal Formulation: With patient Time For Goal Achievement: 04/15/24 Potential to Achieve Goals: Good    Frequency Min 2X/week     Co-evaluation               AM-PAC PT 6 Clicks Mobility  Outcome Measure Help needed turning from your back to your side  while in a flat bed without using bedrails?: None Help needed moving from lying on your back to sitting on the side of a flat bed without using bedrails?: None Help needed moving to and from a bed to a chair (including a wheelchair)?: None Help needed standing up from a chair using your arms (e.g., wheelchair or bedside chair)?: A Little Help needed to walk in hospital room?: Total Help needed climbing 3-5 steps with a railing? : Total 6 Click Score: 17    End of Session Equipment Utilized During Treatment: Gait belt Activity Tolerance: Treatment limited secondary to medical complications (Comment) (hypotension) Patient left: in chair;with call bell/phone within reach;with chair alarm set Nurse Communication: Mobility status PT Visit Diagnosis: Unsteadiness on feet (R26.81);Dizziness and giddiness (R42)    Time: 9053-8976 PT Time Calculation (min) (ACUTE ONLY): 37 min   Charges:   PT Evaluation $PT Eval Moderate Complexity: 1 Mod PT Treatments $Therapeutic Activity: 8-22 mins PT General Charges $$ ACUTE PT VISIT: 1 Visit         Richerd Lipoma, PT  Acute Rehab Services Secure chat preferred Office [phone]  [place] L Wendelyn Kiesling 04/09/2024, 12:50 PM

## 2024-04-16 NOTE — Progress Notes (Addendum)
 VAST RN responded to code blue page. Pt has working IV access at this time. 1235 IV access not working. IO placed and used for code drugs and fluids.

## 2024-04-16 NOTE — Progress Notes (Signed)
 Chaplain responded to Code Blue. No family present at this time. MD contacted wife [name] in my presence, who stated she would come as soon as possible. He informed her chaplains are available, and will reach out when she arrives. I assured we are here for staff support as well, and remain available as needs arise.

## 2024-04-16 NOTE — Progress Notes (Signed)
 Dr. Scarlett requested that 6N leadership initiate a device interrogation. The Nurse Manager (NM) contacted [employer] to request a technician visit. Dorise and Medford spoke with the NM, and Medford indicated that the earliest he could be available was 1630. The patient's RN communicated this to the provider, who acknowledged and expressed understanding.

## 2024-04-16 DEATH — deceased

## 2024-04-22 ENCOUNTER — Ambulatory Visit: Admitting: Family Medicine

## 2024-05-13 MED FILL — Medication: Qty: 1 | Status: AC
